# Patient Record
Sex: Male | Born: 1975 | Race: Black or African American | Hispanic: No | Smoking: Current every day smoker
Health system: Southern US, Community
[De-identification: ages and names within clinical notes are randomized; demographics above are authoritative.]

## PROBLEM LIST (undated history)

## (undated) DIAGNOSIS — F329 Major depressive disorder, single episode, unspecified: Secondary | ICD-10-CM

## (undated) DIAGNOSIS — N529 Male erectile dysfunction, unspecified: Secondary | ICD-10-CM

## (undated) DIAGNOSIS — E119 Type 2 diabetes mellitus without complications: Secondary | ICD-10-CM

## (undated) DIAGNOSIS — E669 Obesity, unspecified: Secondary | ICD-10-CM

## (undated) DIAGNOSIS — I10 Essential (primary) hypertension: Secondary | ICD-10-CM

## (undated) DIAGNOSIS — I5022 Chronic systolic (congestive) heart failure: Secondary | ICD-10-CM

## (undated) DIAGNOSIS — Z87898 Personal history of other specified conditions: Secondary | ICD-10-CM

## (undated) DIAGNOSIS — I472 Ventricular tachycardia: Secondary | ICD-10-CM

## (undated) DIAGNOSIS — Z4502 Encounter for adjustment and management of automatic implantable cardiac defibrillator: Secondary | ICD-10-CM

## (undated) DIAGNOSIS — I428 Other cardiomyopathies: Secondary | ICD-10-CM

## (undated) DIAGNOSIS — I4729 Other ventricular tachycardia: Secondary | ICD-10-CM

## (undated) HISTORY — DX: Major depressive disorder, single episode, unspecified: F32.9

## (undated) HISTORY — DX: Essential (primary) hypertension: I10

## (undated) HISTORY — PX: CARDIAC CATHETERIZATION: SHX172

## (undated) HISTORY — DX: Ventricular tachycardia: I47.2

## (undated) HISTORY — DX: Chronic systolic (congestive) heart failure: I50.22

## (undated) HISTORY — DX: Obesity, unspecified: E66.9

## (undated) HISTORY — DX: Type 2 diabetes mellitus without complications: E11.9

## (undated) HISTORY — DX: Encounter for adjustment and management of automatic implantable cardiac defibrillator: Z45.02

## (undated) HISTORY — DX: Personal history of other specified conditions: Z87.898

## (undated) HISTORY — DX: Male erectile dysfunction, unspecified: N52.9

## (undated) HISTORY — DX: Other ventricular tachycardia: I47.29

## (undated) HISTORY — DX: Other cardiomyopathies: I42.8

## (undated) SURGERY — Surgical Case
Anesthesia: *Unknown

---

## 1999-04-25 ENCOUNTER — Emergency Department (HOSPITAL_COMMUNITY): Admission: EM | Admit: 1999-04-25 | Discharge: 1999-04-25 | Payer: Self-pay | Admitting: Emergency Medicine

## 1999-11-18 ENCOUNTER — Encounter: Payer: Self-pay | Admitting: Emergency Medicine

## 1999-11-18 ENCOUNTER — Emergency Department (HOSPITAL_COMMUNITY): Admission: EM | Admit: 1999-11-18 | Discharge: 1999-11-18 | Payer: Self-pay | Admitting: Emergency Medicine

## 1999-11-19 ENCOUNTER — Encounter: Payer: Self-pay | Admitting: Emergency Medicine

## 1999-11-19 ENCOUNTER — Emergency Department (HOSPITAL_COMMUNITY): Admission: EM | Admit: 1999-11-19 | Discharge: 1999-11-19 | Payer: Self-pay | Admitting: Emergency Medicine

## 1999-11-27 ENCOUNTER — Ambulatory Visit: Admission: RE | Admit: 1999-11-27 | Discharge: 1999-11-27 | Payer: Self-pay | Admitting: Internal Medicine

## 2000-03-01 ENCOUNTER — Encounter: Payer: Self-pay | Admitting: Emergency Medicine

## 2000-03-01 ENCOUNTER — Emergency Department (HOSPITAL_COMMUNITY): Admission: EM | Admit: 2000-03-01 | Discharge: 2000-03-01 | Payer: Self-pay | Admitting: Emergency Medicine

## 2001-10-18 ENCOUNTER — Emergency Department (HOSPITAL_COMMUNITY): Admission: EM | Admit: 2001-10-18 | Discharge: 2001-10-18 | Payer: Self-pay | Admitting: *Deleted

## 2001-10-29 ENCOUNTER — Emergency Department (HOSPITAL_COMMUNITY): Admission: EM | Admit: 2001-10-29 | Discharge: 2001-10-29 | Payer: Self-pay | Admitting: *Deleted

## 2001-10-30 ENCOUNTER — Inpatient Hospital Stay (HOSPITAL_COMMUNITY): Admission: EM | Admit: 2001-10-30 | Discharge: 2001-11-02 | Payer: Self-pay | Admitting: Emergency Medicine

## 2001-10-30 ENCOUNTER — Encounter: Payer: Self-pay | Admitting: Internal Medicine

## 2001-11-01 ENCOUNTER — Encounter: Payer: Self-pay | Admitting: Internal Medicine

## 2001-11-06 ENCOUNTER — Encounter: Payer: Self-pay | Admitting: Internal Medicine

## 2001-11-06 ENCOUNTER — Ambulatory Visit (HOSPITAL_COMMUNITY): Admission: RE | Admit: 2001-11-06 | Discharge: 2001-11-06 | Payer: Self-pay | Admitting: Internal Medicine

## 2004-04-10 ENCOUNTER — Emergency Department (HOSPITAL_COMMUNITY): Admission: EM | Admit: 2004-04-10 | Discharge: 2004-04-10 | Payer: Self-pay | Admitting: Emergency Medicine

## 2005-01-23 ENCOUNTER — Emergency Department (HOSPITAL_COMMUNITY): Admission: EM | Admit: 2005-01-23 | Discharge: 2005-01-23 | Payer: Self-pay | Admitting: Emergency Medicine

## 2005-02-04 ENCOUNTER — Emergency Department (HOSPITAL_COMMUNITY): Admission: EM | Admit: 2005-02-04 | Discharge: 2005-02-04 | Payer: Self-pay | Admitting: Family Medicine

## 2005-02-16 ENCOUNTER — Inpatient Hospital Stay (HOSPITAL_COMMUNITY): Admission: EM | Admit: 2005-02-16 | Discharge: 2005-02-19 | Payer: Self-pay | Admitting: Emergency Medicine

## 2005-02-16 ENCOUNTER — Ambulatory Visit: Payer: Self-pay | Admitting: Cardiology

## 2005-02-17 ENCOUNTER — Encounter: Payer: Self-pay | Admitting: Cardiology

## 2005-02-23 ENCOUNTER — Inpatient Hospital Stay (HOSPITAL_BASED_OUTPATIENT_CLINIC_OR_DEPARTMENT_OTHER): Admission: RE | Admit: 2005-02-23 | Discharge: 2005-02-23 | Payer: Self-pay | Admitting: Cardiology

## 2005-02-23 ENCOUNTER — Ambulatory Visit: Payer: Self-pay | Admitting: Internal Medicine

## 2005-03-05 ENCOUNTER — Ambulatory Visit: Payer: Self-pay | Admitting: Internal Medicine

## 2005-03-08 ENCOUNTER — Ambulatory Visit: Payer: Self-pay | Admitting: *Deleted

## 2005-03-11 ENCOUNTER — Ambulatory Visit: Payer: Self-pay | Admitting: Cardiology

## 2005-03-18 ENCOUNTER — Ambulatory Visit: Payer: Self-pay | Admitting: Internal Medicine

## 2005-03-26 ENCOUNTER — Ambulatory Visit: Payer: Self-pay | Admitting: Internal Medicine

## 2005-03-29 ENCOUNTER — Ambulatory Visit (HOSPITAL_COMMUNITY): Admission: RE | Admit: 2005-03-29 | Discharge: 2005-03-29 | Payer: Self-pay | Admitting: Internal Medicine

## 2005-03-29 ENCOUNTER — Ambulatory Visit: Payer: Self-pay | Admitting: Internal Medicine

## 2005-04-08 ENCOUNTER — Ambulatory Visit: Payer: Self-pay | Admitting: Internal Medicine

## 2005-04-26 ENCOUNTER — Ambulatory Visit: Payer: Self-pay | Admitting: Internal Medicine

## 2005-05-11 ENCOUNTER — Ambulatory Visit: Payer: Self-pay | Admitting: Internal Medicine

## 2005-06-15 ENCOUNTER — Ambulatory Visit: Payer: Self-pay | Admitting: Internal Medicine

## 2005-09-14 ENCOUNTER — Ambulatory Visit: Payer: Self-pay | Admitting: Internal Medicine

## 2005-11-09 ENCOUNTER — Ambulatory Visit: Payer: Self-pay | Admitting: Internal Medicine

## 2005-11-22 ENCOUNTER — Ambulatory Visit: Payer: Self-pay | Admitting: Cardiology

## 2005-12-23 ENCOUNTER — Ambulatory Visit: Payer: Self-pay | Admitting: Cardiology

## 2006-01-13 ENCOUNTER — Ambulatory Visit: Payer: Self-pay | Admitting: Internal Medicine

## 2007-09-01 ENCOUNTER — Emergency Department (HOSPITAL_COMMUNITY): Admission: EM | Admit: 2007-09-01 | Discharge: 2007-09-01 | Payer: Self-pay | Admitting: Emergency Medicine

## 2007-12-16 ENCOUNTER — Emergency Department (HOSPITAL_COMMUNITY): Admission: EM | Admit: 2007-12-16 | Discharge: 2007-12-16 | Payer: Self-pay | Admitting: Emergency Medicine

## 2008-03-15 ENCOUNTER — Ambulatory Visit: Payer: Self-pay | Admitting: Internal Medicine

## 2008-07-14 ENCOUNTER — Emergency Department (HOSPITAL_COMMUNITY): Admission: EM | Admit: 2008-07-14 | Discharge: 2008-07-14 | Payer: Self-pay | Admitting: Emergency Medicine

## 2008-08-08 ENCOUNTER — Ambulatory Visit: Payer: Self-pay | Admitting: Cardiology

## 2008-08-08 LAB — CONVERTED CEMR LAB
BUN: 9 mg/dL (ref 6–23)
CO2: 27 meq/L (ref 19–32)
Chloride: 110 meq/L (ref 96–112)
Creatinine, Ser: 1.2 mg/dL (ref 0.4–1.5)
GFR calc non Af Amer: 74 mL/min
Potassium: 3.6 meq/L (ref 3.5–5.1)

## 2008-11-19 ENCOUNTER — Emergency Department (HOSPITAL_COMMUNITY): Admission: EM | Admit: 2008-11-19 | Discharge: 2008-11-19 | Payer: Self-pay | Admitting: Emergency Medicine

## 2009-04-10 ENCOUNTER — Emergency Department (HOSPITAL_COMMUNITY): Admission: EM | Admit: 2009-04-10 | Discharge: 2009-04-10 | Payer: Self-pay | Admitting: Emergency Medicine

## 2009-04-28 DIAGNOSIS — I509 Heart failure, unspecified: Secondary | ICD-10-CM | POA: Insufficient documentation

## 2009-04-28 DIAGNOSIS — E1122 Type 2 diabetes mellitus with diabetic chronic kidney disease: Secondary | ICD-10-CM

## 2009-04-28 DIAGNOSIS — N182 Chronic kidney disease, stage 2 (mild): Secondary | ICD-10-CM

## 2009-04-28 DIAGNOSIS — I10 Essential (primary) hypertension: Secondary | ICD-10-CM | POA: Insufficient documentation

## 2009-04-28 DIAGNOSIS — E785 Hyperlipidemia, unspecified: Secondary | ICD-10-CM

## 2009-04-29 ENCOUNTER — Encounter (INDEPENDENT_AMBULATORY_CARE_PROVIDER_SITE_OTHER): Payer: Self-pay | Admitting: *Deleted

## 2009-10-01 ENCOUNTER — Emergency Department (HOSPITAL_COMMUNITY): Admission: EM | Admit: 2009-10-01 | Discharge: 2009-10-01 | Payer: Self-pay | Admitting: Emergency Medicine

## 2009-12-26 ENCOUNTER — Emergency Department (HOSPITAL_COMMUNITY): Admission: EM | Admit: 2009-12-26 | Discharge: 2009-12-26 | Payer: Self-pay | Admitting: Emergency Medicine

## 2011-02-05 LAB — DIFFERENTIAL
Basophils Absolute: 0 10*3/uL (ref 0.0–0.1)
Basophils Relative: 1 % (ref 0–1)
Eosinophils Absolute: 0.1 10*3/uL (ref 0.0–0.7)
Eosinophils Relative: 1 % (ref 0–5)
Monocytes Absolute: 0.6 10*3/uL (ref 0.1–1.0)

## 2011-02-05 LAB — BRAIN NATRIURETIC PEPTIDE: Pro B Natriuretic peptide (BNP): 454 pg/mL — ABNORMAL HIGH (ref 0.0–100.0)

## 2011-02-05 LAB — POCT I-STAT, CHEM 8
Calcium, Ion: 1.12 mmol/L (ref 1.12–1.32)
Chloride: 105 mEq/L (ref 96–112)
Glucose, Bld: 105 mg/dL — ABNORMAL HIGH (ref 70–99)
HCT: 46 % (ref 39.0–52.0)
Hemoglobin: 15.6 g/dL (ref 13.0–17.0)
TCO2: 27 mmol/L (ref 0–100)

## 2011-02-05 LAB — POCT CARDIAC MARKERS
CKMB, poc: <1 ng/mL — ABNORMAL LOW (ref 1.0–8.0)
Myoglobin, poc: 60.4 ng/mL (ref 12–200)
Troponin i, poc: <0.05 ng/mL (ref 0.00–0.09)

## 2011-02-05 LAB — CBC: HCT: 45.5 % (ref 39.0–52.0)

## 2011-02-18 ENCOUNTER — Encounter: Payer: Self-pay | Admitting: Physician Assistant

## 2011-02-22 ENCOUNTER — Ambulatory Visit: Payer: Self-pay | Admitting: Physician Assistant

## 2011-02-23 LAB — CBC
HCT: 44.2 % (ref 39.0–52.0)
Hemoglobin: 15.1 g/dL (ref 13.0–17.0)
MCV: 88.2 fL (ref 78.0–100.0)
Platelets: 184 10*3/uL (ref 150–400)
RBC: 5.01 MIL/uL (ref 4.22–5.81)
WBC: 6.3 10*3/uL (ref 4.0–10.5)

## 2011-02-23 LAB — BASIC METABOLIC PANEL
BUN: 11 mg/dL (ref 6–23)
Calcium: 8.4 mg/dL (ref 8.4–10.5)
Creatinine, Ser: 1.02 mg/dL (ref 0.4–1.5)
GFR calc non Af Amer: >60 mL/min (ref >60)
Glucose, Bld: 115 mg/dL — ABNORMAL HIGH (ref 70–99)

## 2011-02-23 LAB — DIFFERENTIAL
Eosinophils Absolute: 0.2 10*3/uL (ref 0.0–0.7)
Eosinophils Relative: 3 % (ref 0–5)
Lymphs Abs: 2.8 10*3/uL (ref 0.7–4.0)

## 2011-02-23 LAB — BRAIN NATRIURETIC PEPTIDE: Pro B Natriuretic peptide (BNP): 349 pg/mL — ABNORMAL HIGH (ref 0.0–100.0)

## 2011-03-01 LAB — DIFFERENTIAL
Eosinophils Absolute: 0 10*3/uL (ref 0.0–0.7)
Eosinophils Relative: 0 % (ref 0–5)
Lymphocytes Relative: 5 % — ABNORMAL LOW (ref 12–46)
Lymphs Abs: 0.4 10*3/uL — ABNORMAL LOW (ref 0.7–4.0)
Monocytes Absolute: 0.6 10*3/uL (ref 0.1–1.0)
Monocytes Relative: 8 % (ref 3–12)

## 2011-03-01 LAB — CBC
Hemoglobin: 16.5 g/dL (ref 13.0–17.0)
MCHC: 33.7 g/dL (ref 30.0–36.0)
MCV: 89.1 fL (ref 78.0–100.0)
Platelets: 193 10*3/uL (ref 150–400)
RBC: 5.5 MIL/uL (ref 4.22–5.81)
WBC: 7.5 10*3/uL (ref 4.0–10.5)

## 2011-03-01 LAB — COMPREHENSIVE METABOLIC PANEL
ALT: 36 U/L (ref 0–53)
AST: 27 U/L (ref 0–37)
Albumin: 4 g/dL (ref 3.5–5.2)
Alkaline Phosphatase: 78 U/L (ref 39–117)
BUN: 12 mg/dL (ref 6–23)
CO2: 27 mEq/L (ref 19–32)
Chloride: 107 mEq/L (ref 96–112)
Creatinine, Ser: 1.21 mg/dL (ref 0.4–1.5)

## 2011-03-03 ENCOUNTER — Encounter: Payer: Self-pay | Admitting: Physician Assistant

## 2011-03-03 ENCOUNTER — Ambulatory Visit (INDEPENDENT_AMBULATORY_CARE_PROVIDER_SITE_OTHER): Payer: Self-pay | Admitting: Physician Assistant

## 2011-03-03 VITALS — BP 140/100 | HR 75 | Resp 18 | Ht 76.0 in | Wt 276.1 lb

## 2011-03-03 DIAGNOSIS — I509 Heart failure, unspecified: Secondary | ICD-10-CM

## 2011-03-03 DIAGNOSIS — I10 Essential (primary) hypertension: Secondary | ICD-10-CM

## 2011-03-03 DIAGNOSIS — I428 Other cardiomyopathies: Secondary | ICD-10-CM

## 2011-03-03 MED ORDER — CARVEDILOL 6.25 MG PO TABS: 6.2500 mg | ORAL_TABLET | Freq: Two times a day (BID) | ORAL | Status: DC

## 2011-03-03 MED ORDER — FUROSEMIDE 40 MG PO TABS: 40.0000 mg | ORAL_TABLET | Freq: Every day | ORAL | Status: DC

## 2011-03-03 MED ORDER — POTASSIUM CHLORIDE CRYS ER 20 MEQ PO TBCR: 20.0000 meq | EXTENDED_RELEASE_TABLET | Freq: Every day | ORAL | Status: DC

## 2011-03-03 MED ORDER — LISINOPRIL 10 MG PO TABS: 10.0000 mg | ORAL_TABLET | Freq: Every day | ORAL | Status: DC

## 2011-03-03 NOTE — Assessment & Plan Note (Signed)
Uncontrolled.  Reinitiate medications as outlined.  Follow up in 2 weeks.

## 2011-03-03 NOTE — Patient Instructions (Addendum)
Your physician recommends that you schedule a follow-up appointment in: 03/17/11 @ 10:30 TO SEE SCOTT WEAVER, PA-C.  Your physician recommends that you schedule a follow-up appointment in: 2 MONTHS WITH DR. [name] AS PER SCOTT WEAVER, PA-C.  Your physician recommends that you return for lab work in: TODAY BMET 425.4, 401.9....YOU ALSO NEED TO COME IN ON 4/27 FOR REPEAT BMET 425.4, 401.9.  Your physician has recommended you make the following change in your medication:RESTART LASIX 40 MG 1 TAB DAILY, DAY 2 START COREG 6.25 MG 1 TAB TWICE DAILY , DAY 3 START POTASSIUM 20 mEq 1 TAB DAILY, AND ON DAY 4 START LISINOPRIL 10 MG 1 TAB DAILY.

## 2011-03-03 NOTE — Progress Notes (Signed)
History of Present Illness: Primary Cardiologist:  Dr. [name]  Douglas [name] is a 35 y.o. male with a h/o NICM returns for follow up after a long hiatus.  He was last seen in 2009.    He has been out of all of his medications for several months now.  He was taking furosemide up until the last few weeks and then ran out.  He denies chest pain.  He denies significant shortness of breath.  He denies orthopnea, PND or pedal edema.  He has had some swelling in his extremities.  He denies syncope.  He denies palpitations.  He had a recent URI.  His cough is improving.  Past Medical History  Diagnosis Date  . Diabetes mellitus   . Hypertension   . Hyperlipidemia   . Systolic CHF, chronic   . Obesity   . Nonischemic cardiomyopathy     a.  echo 4/06: EF 30%, mild to mod MR, mild RAE, inf HK, lat HK , ant HK;    b.  cath 4/06: no CAD, EF 20-25%  . ED (erectile dysfunction)   . NSVT (nonsustained ventricular tachycardia)     eval by EP in past; no ICD candidate due to NYHA 1 symptoms    Current Outpatient Prescriptions  Medication Sig Dispense Refill  . aspirin 81 MG tablet Take 81 mg by mouth daily.        . potassium chloride (KLOR-CON) 10 MEQ CR tablet Take 10 mEq by mouth 2 (two) times daily.        Marland Kitchen DISCONTD: bisoprolol-hydrochlorothiazide (ZIAC) 10-6.25 MG per tablet Take 1 tablet by mouth daily.        Marland Kitchen DISCONTD: furosemide (LASIX) 40 MG tablet Take 40 mg by mouth daily.        Marland Kitchen DISCONTD: lisinopril (PRINIVIL,ZESTRIL) 20 MG tablet Take 20 mg by mouth daily.          Allergies not on file  History  Substance Use Topics  . Smoking status: Current Some Day Smoker -- 0.3 packs/day    Types: Cigarettes  . Smokeless tobacco: Never Used  . Alcohol Use: 0.5 oz/week    1 drink(s) per week    ROS:  Please see the history of present illness.  He denies fevers, chills, melena, hematochezia.  All other systems reviewed and negative.  Vital Signs: BP 140/100  Pulse 75   Resp 18  Ht 6\' 4"  (1.93 m)  Wt 276 lb 1.9 oz (125.247 kg)  BMI 33.61 kg/m2  PHYSICAL EXAM: Well nourished, well developed, in no acute distress HEENT: normal Neck: no JVD Endocrine: No thyromegaly Cardiac:  normal S1, S2; RRR; no murmur, no gallops Lungs:  clear to auscultation bilaterally, no wheezing, rhonchi or rales Abd: soft, nontender, no hepatomegaly Ext: 1+ bilateral edema Skin: warm and dry Neuro:  CNs 2-12 intact, no focal abnormalities noted Psych: Normal affect  EKG:  Sinus rhythm, heart rate 75, LVH, Nonspecific ST-T wave changes  ASSESSMENT AND PLAN:

## 2011-03-03 NOTE — Assessment & Plan Note (Signed)
Overall, he is stable.  He does have some volume overload on exam.  I will reinitiate all of his CHF medications.  He will first start with furosemide 40 mg a day plus potassium 20 mEq a day.  He will then add Coreg 6.25 mg b.i.d.  He will then add lisinopril 10 mg a day.  Check a basic metabolic panel today.  Follow up with a basic metabolic panel in 10 days.  Follow up with me in 2 weeks and Dr. [name] in 2 months.

## 2011-03-04 LAB — BASIC METABOLIC PANEL
CO2: 28 mEq/L (ref 19–32)
Calcium: 9.4 mg/dL (ref 8.4–10.5)
GFR: 80.15 mL/min (ref >60.00)
Potassium: 5.2 mEq/L — ABNORMAL HIGH (ref 3.5–5.1)
Sodium: 141 mEq/L (ref 135–145)

## 2011-03-05 ENCOUNTER — Telehealth: Payer: Self-pay | Admitting: *Deleted

## 2011-03-05 DIAGNOSIS — I428 Other cardiomyopathies: Secondary | ICD-10-CM

## 2011-03-05 DIAGNOSIS — I10 Essential (primary) hypertension: Secondary | ICD-10-CM

## 2011-03-05 NOTE — Telephone Encounter (Signed)
See phone note

## 2011-03-12 ENCOUNTER — Other Ambulatory Visit: Payer: Self-pay | Admitting: *Deleted

## 2011-03-17 ENCOUNTER — Ambulatory Visit: Payer: Self-pay | Admitting: Physician Assistant

## 2011-03-30 NOTE — Assessment & Plan Note (Signed)
[facility]                            CARDIOLOGY OFFICE NOTE   THONG, FEENY                    MRN:          [id number]  DATE:03/15/2008                            DOB:          09-20-1976    PRIMARY CARE PHYSICIAN:  [facility].   INTERVAL HISTORY:  Douglas [name] is a delightful 35 year old male with a  history of nonischemic heart failure secondary to nonischemic  cardiomyopathy with previous ejection fraction of 20-25%.  He returns to  clinic after nearly 2-year absence.   Remainder of medical history is notable for diabetes, morbid obesity,  hypertension, hyperlipidemia, and nonsustained VT.   He has been evaluated by Dr. Lewayne Bunting in the past for possible ICD,  but thought not to be a candidate as he was NYHA class I.   He returns today for follow-up.  He is doing great.  He says he is quite  active to do almost anything he wants without any functional limitation.  Denies any chest pain or shortness of breath.  No lower extremity edema.  He has been compliant with his medications.  He has lost almost 40  pounds through diet and exercise.   MEDICATIONS:  1. Aspirin 81 a day.  2. Lisinopril 20 a day.  3. Bisoprolol/HCTZ 10/6.25 a day.  4. Lasix 40 a day.   PHYSICAL EXAMINATION:  GENERAL:  He is well-appearing, no acute  distress.  Ambulates around the clinic without any respiratory  difficulty.  VITAL SIGNS:  Blood pressure is 118/88, heart rate 56,  weight 288.  HEENT:  Normal.  NECK:  Supple.  No JVD.  Carotids are 2+ bilaterally without any bruits.  There is no lymphadenopathy or thyromegaly.  CARDIAC:  PMI is nonpalpable.  Regular rate and rhythm.  No murmurs,  rubs or gallops.  LUNGS:  Clear.  ABDOMEN:  Obese, nontender, nondistended, no hepatosplenomegaly, no  bruits, no masses.  Good bowel sounds.  EXTREMITIES:  Warm with no  cyanosis, clubbing or edema.  No rash.  NEUROLOGICAL:  Alert and oriented x3.  Cranial nerves  II-XII intact.  Moves all four extremities without difficulty.  Affect is pleasant.   EKG shows sinus bradycardia with no ST-T wave abnormalities.  I did a  quick look echocardiogram which EF appears to be about 35-40% so it has  improved.  He will get a formal echocardiogram here in the near future.   ASSESSMENT/PLAN:  1. Congestive heart failure secondary to nonischemic cardiomyopathy.      Ejection fraction seems to be improved.  He is on a good medical      regimen.  He will get a formal echocardiogram soon.  We increased      his lisinopril to 40 mg a day.  He will need BMET in 1-2 weeks.      Currently not a defibrillator candidate due to improved ejection      fraction and functional class I.  2. Hypertension well-controlled.  3. Hyperlipidemia.  This is followed by [facility].  4. Erectile dysfunction.  Will give him prescription for Viagra.  I  have instructed him never to use this with any nitrates.     Bevelyn Buckles. Bensimhon, MD  Electronically Signed    DRB/MedQ  DD: 03/15/2008  DT: 03/15/2008  Job #: [id number]   cc:   Tomasa Rand

## 2011-03-30 NOTE — Assessment & Plan Note (Signed)
[facility]                          CHRONIC HEART FAILURE NOTE   NAME:Douglas [name], Douglas [name]                    MRN:          [id number]  DATE:08/08/2008                            DOB:          1976/07/30    PRIMARY CARE PHYSICIAN:  [facility].   PRIMARY CARDIOLOGIST:  Bevelyn Buckles. Bensimhon, MD   Mr. Mole has not been here in the Heart Failure Clinic since 2007.  Does not look like he is has seen Dr. [name] since May 2009.  He  returns today because of a recent ER visit for acute exacerbation of his  congestive heart failure, which is secondary to nonischemic  cardiomyopathy in the setting of his of morbid obesity and hypertension.  Dequandre states he ran out of his medicines while he was in the process  of moving to [place], could not locate the boxes, had his medications  in them, and went without for several days while consuming large amounts  of fluid.  Needless to say he woke up one night and extremely short of  breath presented to the emergency room at [facility] where he was  diuresed and stabilized and apparently was discharged home from there  because there is no documentation that we saw him.  He states he has  been compliant with this medication since being discharged home.  Except  for his bisoprolol, he is going to pick that up today.  Needless, his  blood pressure is elevated, but states he is going to get the bisoprolol  soon as he leaves.  He continues to try and lose weight.  He states he  walks.  He is not employed because of his disability.  His last  documented recording of ejection fraction was 20-25% and this was by  echocardiogram in 2006, however at the patient's visit with Dr.  [name] back in May of this year, it sounds like he did a quick and  formal bedside echo that showed his EF of 35-40% and had arranged for  the patient to have a followup echocardiogram done which the patient  never returned for.  Hobart  denies any shortness of breath, orthopnea,  PND, states his weight has been consistent at home.   PAST MEDICAL HISTORY:  1. Congestive heart failure secondary to nonischemic cardiomyopathy      with EF possibly 35-40%.  an echo has not been repeated since 2006.  2. Morbid obesity.  3. Hypertension.  4. Medical noncompliance.  5. History of nonsustained V-tach.  6. Diabetes.  7. Hyperlipidemia.   PHYSICAL EXAMINATION:  VITAL SIGNS:  Weight 284 pounds, blood pressure  150/90 with a heart rate of 84.  GENERAL:  [name] is in no acute distress at this time.  NECK:  No signs of jugular vein distention at 45-degree angle.  LUNGS:  Clear to auscultation bilaterally.  CARDIOVASCULAR:  S1 and S2.  Regular rate and rhythm.  ABDOMEN:  Soft and nontender.  Positive bowel sounds.  LOWER EXTREMITIES:  Without clubbing, cyanosis, or edema.  NEUROLOGIC:  Alert and oriented x3.   A 12-lead EKG showing sinus rhythm.  PR  170, QRS of 94, QT of 408, and a  QTc of 479 msec.  Previous echo or EKG done in May shows a QT, QTc of  440, and 424 msec respectively.   IMPRESSION:  Congestive heart failure secondary to nonischemic  cardiomyopathy.  The patient without signs of volume overload at this  time.  For the history of obesity, hypertension, and noncompliance, have  not taken his bisoprolol yet today, states he is picking it up on the  way home, slightly moderately hypertensive.  I have reinforced the  importance of medication compliance, weight loss with Mr. Bewley.  We  need to repeat his 2-D echocardiogram.  He states he cannot afford to do  that right now.  I reminded him that he is still at risk for repeat  exacerbations if he stops those medications.  We will continue  medications at current dose, and I will see him back in 4 weeks for  followup and schedule him to see Dr. [name] also.      Dorian Pod, ACNP  Electronically Signed      Rollene Rotunda, MD, [facility]  Electronically  Signed   MB/MedQ  DD: 08/08/2008  DT: 08/09/2008  Job #: [id number]

## 2011-04-02 NOTE — Cardiovascular Report (Signed)
NAMEALFIE, RIDEAUX NO.:  [id number]   MEDICAL RECORD NO.:  [id number]          PATIENT TYPE:  OIB   LOCATION:  6501                         FACILITY:  [facility]   PHYSICIAN:  [name], M.D. LHCDATE OF BIRTH:  1976/09/25   DATE OF PROCEDURE:  02/23/2005  DATE OF DISCHARGE:                              CARDIAC CATHETERIZATION   PATIENT IDENTIFICATION:  Mr. Bostock is a 35 year old man with a history of  hypertension, hyperlipidemia and diabetes with a several-year history of  presumed nonischemic cardiomyopathy.  He was recently admitted for a heart  failure flare.  He did have some nonsustained ventricular tachycardia at the  time.  He was referred for a diagnostic right and left heart  catheterization.   PROCEDURES PERFORMED:  1.  Selective coronary angiography.  2.  Left heart catheterization.  3.  Left ventriculogram.  4.  Right heart catheterization.   DESCRIPTION OF PROCEDURE:  The risks and benefits of the catheterization  were explained to Mr. Jimmey Ralph.  Consent was signed and placed on the chart.  The right groin area was prepped and draped in routine sterile fashion.  It  was then anesthetized with 1% local lidocaine.  Using a Smart needle, the  right femoral artery was identified and easily cannulated.  A 4 French  arterial sheath was placed using a modified Seldinger technique.  Again  using the Smart needle, the right femoral vein was identified and cannulated  and the 7 French venous sheath was placed using the same technique.  Standard preformed catheters, including a Judkins JL4, JR4 and an angled  pigtail were used for the procedure.  All catheter exchanges were made over  a wire.  There were no apparent complications.  At the end of the procedure,  the patient was transferred to the holding area in stable condition for  removal of his vascular access.   FINDINGS:  Right atrial pressure mean 10.  RV pressure 42/8 with a mean of  11.  PA  42/18 with a mean of 30.  Pulmonary wedge pressure mean of 24.  PA  saturation was 69.  SA saturation was 96.  Fick cardiac output was 6.7  L/min.  Fick cardiac index was 2.5 L/min per sq min.  Central aortic  pressure was 89/66 with a mean of 75.  LV was 89/6 with an EDP of 21.   CORONARIES:  The left main was short with no angiographic CAD.  The LAD was  a long vessel that wrapped the apex and gave off two large diagonals.  There  was no significant angiographic CAD.   The left circumflex was a large vessel with significant ectasia through the  mid portion.  It gave off four marginal branches.  The first two were tiny.  The OM3 was a large branching vessel.  The OM4 was normal size.  There were  several small PLs off of the distal AV groove circumflex.  There was no  angiographic CAD.   The right coronary was a large dominant vessel.  It gave off a large  branching PDA and small PL.  There  was diffuse ectasia through the proximal  to mid portion of the RCA with very sluggish flow with swirling of contrast.  However, there was no significant coronary disease.   Left ventriculogram done in the RAO approach showed a dilated heart with  global hypokinesis and an EF of 20-25%.   ASSESSMENT AND PLAN:  1.  Nonischemic cardiomyopathy with no evidence of significant epicardial      coronary disease.  There are ectatic vessels in both the left circumflex      and RCA with very sluggish flow in the RCA.  2.  Dilated left ventricle with markedly decreased ejection fraction.  3.  Mildly elevated left-sided filling pressures with well compensated      cardiac output.  4.  History of nonsustained ventricular tachycardia.   DISCUSSION/PLAN:  Although his cardiac output is well compensated given his  low systolic pressure and evidence of nonsustained tachycardia, he is at  high risk for morbidity and mortality from heart failure and sudden cardiac  death.  He will need continued medical  management.  I discussed with him  that I would favor proceeding with ICD plus/minus biventricular pacer if he  has significant dyssynchrony on echocardiography.  Would likely benefit from  followup in the heart failure clinic for medication titration with Shelby Dubin.      DB/MEDQ  D:  02/23/2005  T:  02/23/2005  Job:  [id number]

## 2011-04-02 NOTE — Consult Note (Signed)
NAME:  GERLAD, PELZEL NO.:  [id number]   MEDICAL RECORD NO.:  [id number]          PATIENT TYPE:  INP   LOCATION:  3705                         FACILITY:  [facility]   PHYSICIAN:  Pricilla Riffle, M.D.    DATE OF BIRTH:  August 19, 1976   DATE OF CONSULTATION:  DATE OF DISCHARGE:  02/19/2005                                   CONSULTATION   ADDENDUM:  Mr. Berkovich is a 35 year old gentleman who we were asked to see  regarding congestive heart failure.  Note of previous full dictation was  done by Ok Anis, N.P. (dictation [phone]).  This is an addendum  to this.  I have seen the patient, agree with the findings.  He is a 28-year-  old with a history of cardiomyopathy, assumed nonischemic by a Cardiolite in  2002.  Also has a history of hypertension, diabetes, and dyslipidemia.  He  was admitted on February 16, 2005, with increased shortness of breath felt  secondary to running out of his medicines.  Note, BNP on admit thought was  only 233.  He had a minimal increase in his troponin on admission.  Currently, the patient is doing better with diuresis; otherwise, workup is  significant for an LDL of 197.  Echocardiogram showed a dilated LV with an  LVEF of 30% with some segmental wall motions changes with better movement  near the apex.  Chest CT today showed no evidence of a PE, no significant  calcification noted on the coronaries.  Telemetry was significant for runs  of nonsustained VT (monomorphic).   Based on the above findings, agree with current medical regimen.  The  patient would be a candidate for ICD placement.  Wound need to, however,  rule out inducible ischemia, given that it has been a few years since  previous scan, and this is documented now.  Have reviewed with the patient,  given his risk factors, would recommend left heart catheterization to define  his anatomy once and for all.  Again, other option would be noninvasive test  again.  The patient  understand and agrees for the catheterization.  Will  plan for next week tentatively on Tuesday.  If this is negative, refer to EP  for ICD consideration.   For now, would continue on current regimen but add Vytorin 10/40 for his  lipid control, can work with the patient regarding getting medicines after  discharge.      PVR/MEDQ  D:  02/19/2005  T:  02/20/2005  Job:  [id number]

## 2011-04-02 NOTE — Discharge Summary (Signed)
NAMECAYLE, THUNDER NO.:  [id number]   MEDICAL RECORD NO.:  [id number]          PATIENT TYPE:  INP   LOCATION:  3705                         FACILITY:  [facility]   PHYSICIAN:  [name], M.D.    DATE OF BIRTH:  1976-07-31   DATE OF ADMISSION:  02/16/2005  DATE OF DISCHARGE:  02/19/2005                                 DISCHARGE SUMMARY   DISCHARGE DIAGNOSES:  1.  Congestive heart failure.  2.  Ischemic cardiomyopathy.  3.  Pneumonia.  4.  Paroxysmal ventricular tachycardia.  5.  Systemic hypertension.  6.  Exogenous obesity.  7.  Type 2 diabetes mellitus.  8.  Mixed hyperlipidemia.   REASON FOR ADMISSION:  This is one of several [facility] admissions  for Mr. Vanbenschoten, a 35 year old gentleman who presents to the emergency room  with a complaint of shortness of breath and cough for at least 2 weeks'  duration.   PHYSICAL FINDINGS:  VITAL SIGNS:  Blood pressure 155/95 with a pulse rate of  120, respiratory rate 32, and temperature 101.  HEENT:  Head is normocephalic and atraumatic.  Pupils equal, round, and  reactive to light and accommodation.  Sclerae anicteric and no conjunctival  pallor.  NECK:  Supple with no adenopathy.  No JVD is noted at 30 degrees.  CHEST:  Bilateral right greater than left wheezing and scattered rhonchi  throughout both lung fields.  There is no evidence of any consolidation.  HEART:  His precordium is 2+ hyperdynamic.  S1 and S2 are normal.  No  significant murmurs or rubs.  ABDOMEN:  Obese, soft, nontender, no organomegaly and no masses.  GENITOURINARY:  Normal male.  RECTAL:  Deferred.  EXTREMITIES:  No clubbing or cyanosis.  Trace to 1+ pedal edema.   EKG; normal sinus rhythm with no deviation of the electrical axis with  increase in QT interval, but otherwise is no acute changes.   Chest x-ray revealed interstitial as well as alveolar edema.   HOSPITAL COURSE:  Mr. Vandyne is admitted with a working diagnosis  of  congestive heart failure related to a previous diagnosis of primary  cardiomyopathy.  The patient has been quite noncompliant which probably  accounts for his elevated blood pressure and congestive heart failure.  Of  note however, is that he had an outpatient history of a fever elevation to  101 and is noted on his admission to the ER to have a low grade fever.  Because of his elevated temperature, blood cultures, sputum cultures, and  urine cultures were also obtained.  The patient was started on Lovenox and  treated with parenteral diuretics for his interstitial pulmonary edema.  Serial enzymes and EKG's were negative for any acute pathology.  He did have  one episode of ventricular tachycardia in the hospital, but this was  asymptomatic.  The patient was seen in consultation by [facility].  It was their feeling that there was no indication for cardiac  catheterization or Cardiolite study.  His workup had been quite extensive  back in 2002 where a Cardiolite ejection fraction was 19% showing severe  global hypokinesis.  They too felt that the patient's course to this point  was related to his poor compliance.  A two-dimensional echocardiogram  performed at this admission did reveal mild to moderate mitral regurgitation  with a dilated left atrium and again severe septal hypokinesis.  Dr. Tenny Craw  felt that the patient should have Vytorin 1040 added to his medical regimen.  A CT scan performed did not show any evidence to suggest a mass lesion or  pulmonary emboli.  The primary consideration at the time of discharge was  whether or not the patient needed an ICD.  It was our feeling that this  could be done as an outpatient.  The patient will be scheduled to be seen by  Duke Salvia, M.D. or Dr. Ladona Ridgel on his follow-up visit with Pricilla Riffle, M.D.   CONDITION ON DISCHARGE:  Significantly improved.   PROGNOSIS:  Guarded in view of his poor compliance and multiple risk  factors  and in view of his significantly reduced ejection fraction.   The patient is scheduled to be seen back in my office in 3 weeks.   DISCHARGE MEDICATIONS:  1.  Avapro 300 mg per day.  2.  Coreg 12.5 b.i.d.  3.  Furosemide 80 mg once a day.  4.  Kay Ciel 20 mEq per day along with Vytorin 1040 once a day.   DIET:  He is discharged on a 3 gram sodium, carb-modified, 1800 calorie,  modified fat restricted diet.       PC/MEDQ  D:  04/22/2005  T:  04/22/2005  Job:  [id number]

## 2011-04-02 NOTE — Consult Note (Signed)
[name], Douglas [name] NO.:  [id number]   MEDICAL RECORD NO.:  [id number]          PATIENT TYPE:  INP   LOCATION:  3705                         FACILITY:  [facility]   PHYSICIAN:  Pricilla Riffle, M.D.    DATE OF BIRTH:  1976/07/20   DATE OF CONSULTATION:  DATE OF DISCHARGE:                                   CONSULTATION   PRIMARY CARE PHYSICIAN:  Dr. [name].   PATIENT PROFILE:  A 35-year-old married African-American male with prior  history of non ischemic cardiopathy who presented to the ER on February 16, 2005, with dyspnea on exertion and CHF.   PROBLEM LIST:  1.  Congestive heart failure/non ischemic cardiopathy.      1.  November 06, 2001, Cardiolite ejection fraction 19%, severe global          hypokinesis with decreased tracer activity anteriorly and inferiorly          without evidence of reversible ischemia.      2.  February 17, 2005, echocardiogram ejection fraction 30%, mild to          moderate mitral regurgitation, LA mildly dilated, trivial tricuspid          regurgitation, inferior and septal severe hypokinesis.  2.  Hypertension.  3.  Type 2 diabetes mellitus.  4.  Obesity.  5.  Hyperlipidemia with a total cholesterol documented in December 2002 of      257.  6.  Nonsustained ventricular tachycardia.   HISTORY OF PRESENT ILLNESS:  A 35 year old [place] American male with  history of nonischemic cardiomyopathy, CHF, hypertension, hyperlipidemia,  type 2 diabetes mellitus x3 years.  He was first diagnosed with nonischemic  cardiomyopathy in December of 2002 and he presented with CHF and mildly  elevated cardiac enzymes.  Functional testing at that time was negative for  ischemia and he was medical managed with ARB, beta-blocker and Lasix.  He  had done well until approximately one and a half months ago when he  developed nocturnal cough causing him to sleep in a recliner and was seen in  the ED on January 23, 2005, secondary to cough with chest x-ray  showing  questionable pulmonary edema.  Per patient, he was discharged home and was  given refills on his home medications which he had run out of previously.  His cough continued, however, he noted noncontributory limitations in his  activities and even on February 13, 2005, he was able to mow his lawn without  having to rest.  On February 12, 2005, he again ran out of his medicines and is  not able to afford them at this time as he was previously receiving Medicaid  which he now makes too much money to receive and his prescription coverage  from his job does not kick until next month.  On February 16, 2005, he spent  most of the prior night coughing and did not sleep well.  Was not able to  lie flat.  That morning he felt short of breath while getting himself ready  for work and then when he finally arrived  at work.  He had dyspnea on  exertion while walking downhill into the building from the parking lot.  Once in the building he sat down and rested and still felt short of breath  and at that point determined that he needed to go to the ER.  He called his  wife and was taken to [facility] ER.  In the ED, his  chest x-ray showed pulmonary edema and ECG was without new changes.  His  initial cardiac markers showed a troponin I of 0.06 with an MB of 2.4.  Subsequent troponins did elevate to a max of 0.18 on the second set and then  came back down to 0.15 by the third set.  His MB remained negative while his  CK peaked to 454.  He was diuresed with IV Lasix in the ED and then admitted  to the medicine service under Dr. [name].  On the evening of February 16, 2005, he had a 14-beat run of asymptomatic nonsustained VT while sleeping.  He had another episode of seven beats which was also asymptomatic on February 17, 2005.  He was reinitiated on Coreg, Lasix and Avapro and was started for  the first time on spironolactone.  With reintroduction of his medications,  blood pressure was  under good control and he drops weight from an admission  weight of 143.6 kg down to 141 kg today.  He underwent echocardiogram  on  February 17, 2005, which showed an EF of 30% with mild to moderate MR and mildly  dilated LA.  He had severe hypokinesis inferiorly and septally.  He now  reports feeling well without any additional shortness of breath and we have  been consulted to clear him prior to discharge.   ALLERGIES:  PENICILLIN.   MEDICATIONS DURING THIS HOSPITALIZATION:  1.  Sliding scale insulin.  2.  Protonix 40 mg daily.  3.  Avapro 30 mg daily.  4.  Aspirin 81 mg daily.  5.  Avelox 400 mg daily.  6.  Guaifenesin 400 mg t.i.d.  7.  K-Dur 20 mEq b.i.d.  8.  Lasix 40 mg daily.  9.  Coreg 12.5 mg b.i.d.  10. Spironolactone 25 mg daily.  11. Half normal saline at 20 mL an hour.  12. Lovenox 70 mg q.24h. for DVT prophylaxis.   FAMILY HISTORY:  Mother is age 52 with a history of CAD and hypertension.  Father is age 68 and alive and well.  He has two brothers and one sister and  there is hypertension there.   SOCIAL HISTORY:  He lives in [place], [place], with his wife and  works as a Investment banker, operational.  He has three children and denies any alcohol or tobacco  use.  He did try drugs when he said he was younger.  He does not exercise  regularly and states he does watch salt and diet as well as sugar.   REVIEW OF SYMPTOMS:  Positive for dyspnea on exertion, orthopnea, PND,  edema, cough as well as diabetes and otherwise all systems reviewed and  negative.   PHYSICAL EXAMINATION:  GENERAL APPEARANCE:  A pleasant hyperactive male in  no acute distress.  Awake, alert and oriented x3.  VITAL SIGNS:  Temperature 97.7, heart rate 77, respirations 24, blood  pressure 114/66, weight 141 kg down from an admission weight of 143.6 kg,  pulse oximeter 95% on room air. I&O's do not appear to be adequately  recorded.  HEENT:  Normocephalic and atraumatic. NECK:  Normal carotid upstrokes.  No  bruits or JVD.  LUNGS:  Respirations regular and unlabored.  CARDIOVASCULAR:  Regular S1 and S2, no S3, S4 or murmurs.  ABDOMEN:  Obese, soft, nontender and nondistended.  Bowel sounds present x4.  EXTREMITIES:  Warm and dry.  No clubbing, cyanosis, or edema.  Dorsalis  pedis and posterior tibial pulses 2+ and equal bilaterally.  SKIN:  Warm and dry without lesions, no masses.  MUSCULOSKELETAL:  No deformity or effusions.   ACCESSORY CLINICAL FINDINGS:  Hemoglobin 14.0, hematocrit 41, wbc 7.8,  platelets 284.  Sodium 139, potassium 4.1, chloride 105, CO2 25, BUN 11,  creatinine 0.9, glucose 96.  Total bilirubin 2.2, alkaline phosphatase 66,  AST 21, ALT 32, total protein 6.5, albumin 3.1.  BNP 133.2 down from an  admission BNP of 223.  PT 13.3, INR 1.0.  TSH 0.833, free T4 1.07.  UA was  negative.  CPK 454, MB 2.4 and troponin I of 0.18.   Chest x-ray on February 18, 2005, showed mild persistent pulmonary edema.   EKG February 17, 2005, showed sinus rhythm with normal axis without STT changes  and left atrial enlargement.   ASSESSMENT/PLAN:  1.  Nonischemic cardiomyopathy/congestive heart failure.  Patient came off      of all medications on February 12, 2005, secondary to financial difficulty      being in between [place] and his insurance coverage which kicks in next      month.  He has been reinitiated on ARB, beta-blocker, Lasix,      spironolactone and has been doing well in house and is now euvolemic      with good blood pressure control.  May be worth while considering      switching him to more affordable medications which has Lopressor b.i.d.      and Lisinopril if he has no previous ACE intolerance.  Would continue      spironolactone and Lasix.  He needs to undergo congestive heart failure      teaching with the importance of  medication compliance as well as sodium      restriction and daily weights driven home.  Patient also has close      outpatient follow-up in heart failure  clinic.  2.  Nonsustained ventricular tachycardia.  He had asymptomatic 14 beat run      as well as 7 beat run of nonsustained ventricular tachycardia during his      hospitalization.  With an ejection fraction of 30%, he certainly      qualifies for implantable cardioverter defibrillator placement per SCD-      HEFT data.  He has not had an ischemic evaluation since December of 2002      and likely prior to any consideration of implantable cardioverter      defibrillator placement, we should reconsider ischemic evaluation with      either another functional study or catheterization.  This likely could      be done as an outpatient.  Continue beta-blocker therapy.  3.  Elevated troponin.  It is hard to know what to make of this.  He has a      history of dilated cardiomyopathy without any ischemic disease in the      past or without any ischemia functional study in the past.  On his last     admission December 2002, he was noted to have a very mildly elevated  troponin as 0.04.  As stated above, he likely should have another      ischemic evaluation.  4.  Hypertension.  This is actually  under good control with beta-blocker      and ARB and Lasix.  5.  Hyperlipidemia.  It looks like he is written for a lipid profile in the      a.m.  Given his history of diabetes as well as documented total      cholesterol of 257 in December 2002, he should certainly be started on      statin therapy.      CRB/MEDQ  D:  02/19/2005  T:  02/19/2005  Job:  [id number]

## 2011-04-02 NOTE — Discharge Summary (Signed)
. [facility]  Patient:    Douglas [name], Douglas [name] Visit Number: [id number] MRN: [id number]          Service Type: MED Location: 3700 [id number] Attending Physician:  Virgia Land Dictated by:   Lindell Spar. Chestine Spore, M.D. Admit Date:  10/29/2001 Discharge Date: 11/02/2001                             Discharge Summary  DISCHARGE DIAGNOSES: 1. Systemic hypertension with hypertensive heart disease, congestive heart    failure, and dilated cardiomyopathy. 2. History of bronchial asthma and right lower lobe pneumonia, with the    organism being unspecified.  REASON FOR ADMISSION:  Douglas [name] is a 35 year old hypertensive gentleman, last seen in the office about two to three years prior to admission.  He presents to the emergency room with a chief complaint of shortness of breath and a persistent cough.  The patient states that he had not been taking any of his antihypertensive medications, and has not been under the care of any other physicians over that two to three year period.  The patient was evaluated in the emergency room where a chest x-ray revealed a right lower lobe infiltrate consistent with pneumonia.  He was hypoxemic, and his blood pressure was elevated at 180/102.  LABORATORY DATA:  A 2-D echocardiogram revealed a left ventricle that was mildly to moderately dilated, overall left ventricular function was diminished with an ejection fraction estimated at 30 to 40%.  He had moderate and diffuse left ventricular hypokinesia, mild to moderate mitral valve regurgitation, the volume of mitral regurgitation by proximal isovelocities surface area was 15 cc.  His EKG revealed sinus tachycardia, no deviation of the electrical axis, and no acute ST-T wave changes.  His admission chest x-ray revealed patchy infiltrates involving the right upper and lower lobes.  There was a question of curly B-lines on the left as well.  It was felt that these  changes were consistent with pneumonia, as well as a symmetric pulmonary edema.  An MRA of the abdomen revealed single renal arteries bilaterally without evidence of renal artery stenosis, and his solid organs were unremarkable.  Arterial blood gas on room air showed his PO2 of 60, PCO2 34, pH 7.45.  White count was elevated at 13,000 with a shift to the left.  Hematocrit 42, hemoglobin 14.4. His metabolic profile was normal with the exception of a non-fasting glucose of 120.  His ALT was elevated slightly at 44, total bilirubin of 1.7.  He had elevations of his CK of 1148, CPK-MB slightly high at 5.4, and troponin-I of 0.4.  His cholesterol was elevated at 257.  T4 of 9.6, TSH of 0.664. Urinalysis showed specific gravity of 1017, pH was 5.5, negative glucose, bilirubin, and hemoglobin.  Positive ketones, and protein is 30, negative nitrites and leukocyte esterase.  Blood cultures x2 revealed no growth. Sputum cultures failed to reveal a single pathogen.  He had normal oropharyngeal flora.  HOSPITAL COURSE:  The patient was admitted after being started on supplemental O2 at 3 L per minute.  In addition, the patient was started on Tequin 400 mg IV daily for presumed bronchopneumonia, and treated with Lasix for any changes of pulmonary vascular congestion.  He was started back on Lotensin at 10 mg daily, Cardizem was given at 5 mg IV initially to get his blood pressure down, and then 180 mg p.o. daily, although he did  have elevations in his CPK and tro levels, he had evolutionary changes on EKG suggesting acute myocardial infarction.  The patient was seen in consultation by Dr. Riley Kill of cardiology service who felt that at such a young age, that the patient should be treated aggressively for his hypertension.  His Lotensin was increased to 20 mg daily, and it was Dr. Louretta Shorten recommendation to use Coreg in place of the calcium channel blocker.  The patient had no further complications  during this hospitalization.  His blood pressure ranged from 110 to 130 on the systolic side, and 80s diastolic in the last few days that he was up and about.  A Cardiolite study was planned during his hospitalization, however, for technical reasons the patient was not done on the scheduled day, and it was felt that this could be arranged as an outpatient.  The patient was then subsequently discharged in a condition that was significantly improved.  He was made quite aware of the importance of controlling his blood pressure.  DISCHARGE MEDICATIONS: 1. Lasix 40 mg q.d. 2. Coreg 6.25 mg b.i.d. 3. Lotensin 20 mg q.d. 4. Baby aspirin 81 mg q.d. 5. Tequin 400 mg q.d. x6 days.  The patient was scheduled to have a repeat chest x-ray two weeks post discharge.  DIET:  A 3 g sodium, modified fat restricted.  FOLLOWUP: 1. He is scheduled to be seen in my office in three weeks. 2. He will be seen by Dr. Riley Kill for Cardiolite study.  DISCHARGE PROGNOSIS:  Fair. Dictated by:   Lindell Spar. Chestine Spore, M.D. Attending Physician:  Virgia Land DD:  12/07/01 TD:  12/08/01 Job: [id number]

## 2011-04-22 ENCOUNTER — Telehealth: Payer: Self-pay | Admitting: Cardiology

## 2011-04-22 NOTE — Telephone Encounter (Signed)
Patient will see Dr. [name] 05/03/11. Left VMTCB to schedule an echocardiogram per Dr. [name].  Floreen Comber, 5:08 PM

## 2011-05-03 ENCOUNTER — Ambulatory Visit: Payer: Self-pay | Admitting: Internal Medicine

## 2011-05-13 ENCOUNTER — Ambulatory Visit: Payer: Self-pay | Admitting: Cardiology

## 2011-05-21 ENCOUNTER — Encounter: Payer: Self-pay | Admitting: Internal Medicine

## 2011-06-05 ENCOUNTER — Emergency Department (HOSPITAL_COMMUNITY): Payer: Self-pay

## 2011-06-05 ENCOUNTER — Emergency Department (HOSPITAL_COMMUNITY)
Admission: EM | Admit: 2011-06-05 | Discharge: 2011-06-05 | Disposition: A | Discharge status: Home / Self Care | Payer: Self-pay | Attending: Emergency Medicine | Admitting: Emergency Medicine

## 2011-06-05 DIAGNOSIS — M7989 Other specified soft tissue disorders: Secondary | ICD-10-CM | POA: Insufficient documentation

## 2011-06-05 DIAGNOSIS — R059 Cough, unspecified: Secondary | ICD-10-CM | POA: Insufficient documentation

## 2011-06-05 DIAGNOSIS — R05 Cough: Secondary | ICD-10-CM | POA: Insufficient documentation

## 2011-06-05 DIAGNOSIS — R0602 Shortness of breath: Secondary | ICD-10-CM | POA: Insufficient documentation

## 2011-06-05 DIAGNOSIS — I509 Heart failure, unspecified: Secondary | ICD-10-CM | POA: Insufficient documentation

## 2011-06-05 DIAGNOSIS — I10 Essential (primary) hypertension: Secondary | ICD-10-CM | POA: Insufficient documentation

## 2011-06-05 DIAGNOSIS — I517 Cardiomegaly: Secondary | ICD-10-CM | POA: Insufficient documentation

## 2011-06-05 LAB — CBC
HCT: 39.1 % (ref 39.0–52.0)
Hemoglobin: 14.1 g/dL (ref 13.0–17.0)
MCH: 30.6 pg (ref 26.0–34.0)
RBC: 4.61 MIL/uL (ref 4.22–5.81)

## 2011-06-05 LAB — BASIC METABOLIC PANEL
BUN: 13 mg/dL (ref 6–23)
CO2: 24 mEq/L (ref 19–32)
Glucose, Bld: 116 mg/dL — ABNORMAL HIGH (ref 70–99)
Potassium: 3.3 mEq/L — ABNORMAL LOW (ref 3.5–5.1)
Sodium: 139 mEq/L (ref 135–145)

## 2011-06-17 ENCOUNTER — Emergency Department (HOSPITAL_COMMUNITY): Payer: Self-pay

## 2011-06-17 ENCOUNTER — Emergency Department (HOSPITAL_COMMUNITY)
Admission: EM | Admit: 2011-06-17 | Discharge: 2011-06-17 | Disposition: A | Discharge status: Home / Self Care | Payer: Self-pay | Attending: Emergency Medicine | Admitting: Emergency Medicine

## 2011-06-17 DIAGNOSIS — I517 Cardiomegaly: Secondary | ICD-10-CM | POA: Insufficient documentation

## 2011-06-17 DIAGNOSIS — I10 Essential (primary) hypertension: Secondary | ICD-10-CM | POA: Insufficient documentation

## 2011-06-17 DIAGNOSIS — R05 Cough: Secondary | ICD-10-CM | POA: Insufficient documentation

## 2011-06-17 DIAGNOSIS — R599 Enlarged lymph nodes, unspecified: Secondary | ICD-10-CM | POA: Insufficient documentation

## 2011-06-17 DIAGNOSIS — R9431 Abnormal electrocardiogram [ECG] [EKG]: Secondary | ICD-10-CM | POA: Insufficient documentation

## 2011-06-17 DIAGNOSIS — R059 Cough, unspecified: Secondary | ICD-10-CM | POA: Insufficient documentation

## 2011-06-17 DIAGNOSIS — I491 Atrial premature depolarization: Secondary | ICD-10-CM | POA: Insufficient documentation

## 2011-06-17 DIAGNOSIS — I509 Heart failure, unspecified: Secondary | ICD-10-CM | POA: Insufficient documentation

## 2011-06-17 LAB — BASIC METABOLIC PANEL
BUN: 15 mg/dL (ref 6–23)
Creatinine, Ser: 1.04 mg/dL (ref 0.50–1.35)
GFR calc Af Amer: >60 mL/min (ref >60)
GFR calc non Af Amer: >60 mL/min (ref >60)
Glucose, Bld: 106 mg/dL — ABNORMAL HIGH (ref 70–99)

## 2011-06-17 LAB — DIFFERENTIAL
Eosinophils Absolute: 0.2 10*3/uL (ref 0.0–0.7)
Eosinophils Relative: 2 % (ref 0–5)
Lymphs Abs: 3.9 10*3/uL (ref 0.7–4.0)
Monocytes Absolute: 0.8 10*3/uL (ref 0.1–1.0)
Monocytes Relative: 10 % (ref 3–12)

## 2011-06-17 LAB — CBC
HCT: 40.4 % (ref 39.0–52.0)
MCH: 30.4 pg (ref 26.0–34.0)
MCHC: 35.6 g/dL (ref 30.0–36.0)
MCV: 85.4 fL (ref 78.0–100.0)
Platelets: 247 10*3/uL (ref 150–400)
RDW: 13.7 % (ref 11.5–15.5)

## 2011-06-17 MED ORDER — IOHEXOL 350 MG/ML SOLN: 100.0000 mL | Freq: Once | INTRAVENOUS | Status: DC | PRN

## 2011-06-21 ENCOUNTER — Encounter (HOSPITAL_COMMUNITY): Payer: Self-pay

## 2011-06-21 ENCOUNTER — Ambulatory Visit (HOSPITAL_COMMUNITY)
Admission: RE | Admit: 2011-06-21 | Discharge: 2011-06-21 | Disposition: A | Discharge status: Home / Self Care | Payer: Self-pay | Source: Ambulatory Visit | Attending: Internal Medicine | Admitting: Internal Medicine

## 2011-06-21 VITALS — BP 142/90 | HR 91 | Ht 76.0 in | Wt 270.0 lb

## 2011-06-21 DIAGNOSIS — I10 Essential (primary) hypertension: Secondary | ICD-10-CM | POA: Insufficient documentation

## 2011-06-21 DIAGNOSIS — I509 Heart failure, unspecified: Secondary | ICD-10-CM | POA: Insufficient documentation

## 2011-06-21 DIAGNOSIS — I5022 Chronic systolic (congestive) heart failure: Secondary | ICD-10-CM | POA: Insufficient documentation

## 2011-06-21 DIAGNOSIS — I428 Other cardiomyopathies: Secondary | ICD-10-CM | POA: Insufficient documentation

## 2011-06-21 DIAGNOSIS — I5042 Chronic combined systolic (congestive) and diastolic (congestive) heart failure: Secondary | ICD-10-CM | POA: Insufficient documentation

## 2011-06-21 LAB — BASIC METABOLIC PANEL
BUN: 17 mg/dL (ref 6–23)
Chloride: 104 mEq/L (ref 96–112)
Creatinine, Ser: 1.02 mg/dL (ref 0.50–1.35)
GFR calc Af Amer: >60 mL/min (ref >60)
GFR calc non Af Amer: >60 mL/min (ref >60)
Glucose, Bld: 108 mg/dL — ABNORMAL HIGH (ref 70–99)

## 2011-06-21 MED ORDER — FUROSEMIDE 40 MG PO TABS: 40.0000 mg | ORAL_TABLET | Freq: Two times a day (BID) | ORAL | Status: DC

## 2011-06-21 MED ORDER — POTASSIUM CHLORIDE CRYS ER 20 MEQ PO TBCR: 20.0000 meq | EXTENDED_RELEASE_TABLET | Freq: Two times a day (BID) | ORAL | Status: DC

## 2011-06-21 MED ORDER — CARVEDILOL 3.125 MG PO TABS: 3.1250 mg | ORAL_TABLET | Freq: Two times a day (BID) | ORAL | Status: DC

## 2011-06-21 MED ORDER — LISINOPRIL 10 MG PO TABS: 10.0000 mg | ORAL_TABLET | Freq: Every day | ORAL | Status: DC

## 2011-06-21 NOTE — Progress Notes (Addendum)
History of Present Illness:  Douglas [name] is a 35 y.o. male with a h/o HTN, HL, obesity and CHF due to NICM. Management has been complicated by severe non-compliance.   Underwent cath in 2006 which showed normal cors with EF 20-25%. Saw Dr. [name] in 2009 but has been lost to f/u since. Just out of prison Feb 2012 (for 1 year)  Saw [name] in the [place] office in April at which time he was out of all of his medications for several months. Had significant volume overload and BP uncontrolled. HF meds restarted. Referred to HF clinic for management. Seen last week in ER due to coughing and felt like his weight was up and he had SOB (notes unavailable) apparently treated for volume overload and referred back here.   Currently only taking lasix and potassium daily. He ran out of medications  Not current weighing but says he can tell when he has edema. 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  Reports lower extremity edema. No abdominal fullness.  Says he is trying to limit salt intake. Smoking one cigarette a day and drinks alcohol on occasion. No palpitations or syncope.  Past Medical History  Diagnosis Date  . Hypertension   . Hyperlipidemia   . Systolic CHF, chronic   . Obesity   . Nonischemic cardiomyopathy     a.  echo 4/06: EF 30%, mild to mod MR, mild RAE, inf HK, lat HK , ant HK;    b.  cath 4/06: no CAD, EF 20-25%  . ED (erectile dysfunction)   . NSVT (nonsustained ventricular tachycardia)     eval by EP in past; no ICD candidate due to NYHA 1 symptoms    Current Outpatient Prescriptions  Medication Sig Dispense Refill  . furosemide (LASIX) 40 MG tablet Take 40 mg by mouth 2 (two) times daily.        . potassium chloride SA (K-DUR,KLOR-CON) 20 MEQ tablet Take 20 mEq by mouth 2 (two) times daily.        Marland Kitchen aspirin 81 MG tablet Take 81 mg by mouth daily.        . carvedilol (COREG) 6.25 MG tablet Take 1  tablet (6.25 mg total) by mouth 2 (two) times daily with a meal.  60 tablet  11  . lisinopril (PRINIVIL,ZESTRIL) 10 MG tablet Take 1 tablet (10 mg total) by mouth daily.  30 tablet  11  . DISCONTD: furosemide (LASIX) 40 MG tablet Take 1 tablet (40 mg total) by mouth daily.  30 tablet  11  . DISCONTD: potassium chloride SA (K-DUR,KLOR-CON) 20 MEQ tablet Take 1 tablet (20 mEq total) by mouth daily.  30 tablet  11    Allergies  Allergen Reactions  . Penicillins     History  Substance Use Topics  . Smoking status: Former Smoker -- 0.3 packs/day    Types: Cigarettes  . Smokeless tobacco: Former Neurosurgeon    Quit date: 06/20/2011  . Alcohol Use: 0.5 oz/week    1 drink(s) per week    Family History  Problem Relation Age of Onset  . Coronary artery disease Mother 31    s/p PCI  . Lung cancer Father   . Diabetes type II Maternal Uncle   . Coronary artery disease Maternal Uncle     ROS:  Please see the history of present illness.  He denies fevers, chills, melena, hematochezia.  All other systems reviewed and negative.  Vital Signs: BP 142/90  Pulse 91  Ht 6\' 4"  (1.93 m)  Wt 270 lb (122.471 kg)  BMI 32.87 kg/m2  PHYSICAL EXAM: Well nourished, well developed, in no acute distress HEENT: normal Neck: JVP hard to see about 9. Carotids 2+ bilaterally.No Bruits  Cardiac:  PMI laterally displaced. RRR with prominent  S3. 2/6 SEM at apex Lungs:  clear to auscultation bilaterally, no wheezing, rhonchi or rales Ab: obese. Soft NT/ND. No bruits. No HSM. Ext warm. 1-2+ edemano cyanosis or clubbing Skin: warm and dry Psych:normal affect. CNII-XII grossly intact  ASSESSMENT AND PLAN:

## 2011-06-21 NOTE — Patient Instructions (Addendum)
Restart Carvedilol 3.125 mg Twice daily  Restart Lisinopril 10 mg daily  Labs today  Your physician recommends that you schedule a follow-up appointment in: 2 weeks  Your physician has requested that you have an echocardiogram. Echocardiography is a painless test that uses sound waves to create images of your heart. It provides your doctor with information about the size and shape of your heart and how well your heart's chambers and valves are working. This procedure takes approximately one hour. There are no restrictions for this procedure.

## 2011-06-21 NOTE — Assessment & Plan Note (Addendum)
Overall improved with recent diuresis. Currently NYHA III with mild volume overload and prominent s3. Hopefully, now that he is out of prison we will be able to have closer f/u with him and titrate his regimen aggressively. Will restart Lisinpril 10mg  daily and Carvedilol 3.125mg  BID. Will obtain BMET  And BNP. Will order ECHO. He will be followed weekly. Once meds titrated need to consider ICD if EF remains depressed which I suspect it will. Will likely also need CPX.   Patient seen and examined with Tonye Becket, NP. We discussed all aspects of the encounter. I agree with the assessment and plan as stated above.

## 2011-06-22 ENCOUNTER — Encounter (HOSPITAL_COMMUNITY): Payer: Self-pay

## 2011-07-05 ENCOUNTER — Ambulatory Visit (HOSPITAL_COMMUNITY): Payer: Self-pay

## 2011-07-06 ENCOUNTER — Ambulatory Visit (HOSPITAL_COMMUNITY): Payer: Self-pay

## 2011-07-08 ENCOUNTER — Ambulatory Visit (HOSPITAL_COMMUNITY)
Admission: RE | Admit: 2011-07-08 | Discharge: 2011-07-08 | Disposition: A | Discharge status: Home / Self Care | Payer: Self-pay | Source: Ambulatory Visit | Attending: Internal Medicine | Admitting: Internal Medicine

## 2011-07-08 VITALS — BP 143/96 | HR 93 | Wt 272.0 lb

## 2011-07-08 DIAGNOSIS — I10 Essential (primary) hypertension: Secondary | ICD-10-CM | POA: Insufficient documentation

## 2011-07-08 DIAGNOSIS — I5022 Chronic systolic (congestive) heart failure: Secondary | ICD-10-CM | POA: Insufficient documentation

## 2011-07-08 DIAGNOSIS — I059 Rheumatic mitral valve disease, unspecified: Secondary | ICD-10-CM

## 2011-07-08 LAB — BASIC METABOLIC PANEL
BUN: 16 mg/dL (ref 6–23)
Chloride: 103 mEq/L (ref 96–112)
Creatinine, Ser: 1.13 mg/dL (ref 0.50–1.35)
GFR calc Af Amer: >60 mL/min (ref >60)
Glucose, Bld: 104 mg/dL — ABNORMAL HIGH (ref 70–99)
Potassium: 4.4 mEq/L (ref 3.5–5.1)

## 2011-07-08 NOTE — Assessment & Plan Note (Addendum)
NYHA II-III with mild volume overload. He has not started his Lisinopril 10 mg  daily or his Carvedilol 3.125mg  BID  because he did not obtain the medication from [facility] pharmacy. [facility] pharmacy was called and it will be prepared again today however he is unable pay for his medications. Pharmacy will provide a 30 day supply of  lisinopril and carvedilol Once medication titrated will again need to consider ICD if EF is depressed. Will obtain BMET and BNP.He will be followed weekly.

## 2011-07-08 NOTE — Assessment & Plan Note (Signed)
BP markedly elevated - restarting meds (see CHF discussion).

## 2011-07-08 NOTE — Patient Instructions (Signed)
Labs today  Your physician recommends that you schedule a follow-up appointment in: 1 week  

## 2011-07-15 ENCOUNTER — Encounter (HOSPITAL_COMMUNITY): Payer: Self-pay

## 2011-07-15 ENCOUNTER — Ambulatory Visit (HOSPITAL_COMMUNITY)
Admission: RE | Admit: 2011-07-15 | Discharge: 2011-07-15 | Disposition: A | Discharge status: Home / Self Care | Payer: Self-pay | Source: Ambulatory Visit | Attending: Internal Medicine | Admitting: Internal Medicine

## 2011-07-15 DIAGNOSIS — I5022 Chronic systolic (congestive) heart failure: Secondary | ICD-10-CM | POA: Insufficient documentation

## 2011-07-15 MED ORDER — SPIRONOLACTONE 25 MG PO TABS: 25.0000 mg | ORAL_TABLET | Freq: Every day | ORAL | Status: DC

## 2011-07-15 NOTE — Assessment & Plan Note (Addendum)
NYHA III. Volume status elevated. Taking medications.  Start Spironolactone 25mg  daily.  He is instructed to weigh daily and record. Scales provided. Will follow up next week.

## 2011-07-15 NOTE — Patient Instructions (Signed)
Stop Potassium Start Spironolactone 25 mg daily  Your physician recommends that you schedule a follow-up appointment in: 1 week with labs

## 2011-07-22 ENCOUNTER — Ambulatory Visit (HOSPITAL_COMMUNITY): Payer: Self-pay

## 2011-08-02 ENCOUNTER — Ambulatory Visit (HOSPITAL_COMMUNITY)
Admission: RE | Admit: 2011-08-02 | Discharge: 2011-08-02 | Disposition: A | Discharge status: Home / Self Care | Payer: Self-pay | Source: Ambulatory Visit | Attending: Internal Medicine | Admitting: Internal Medicine

## 2011-08-02 VITALS — BP 106/68 | HR 90 | Wt 278.5 lb

## 2011-08-02 DIAGNOSIS — I509 Heart failure, unspecified: Secondary | ICD-10-CM | POA: Insufficient documentation

## 2011-08-02 DIAGNOSIS — I428 Other cardiomyopathies: Secondary | ICD-10-CM

## 2011-08-02 DIAGNOSIS — I5022 Chronic systolic (congestive) heart failure: Secondary | ICD-10-CM | POA: Insufficient documentation

## 2011-08-02 DIAGNOSIS — I10 Essential (primary) hypertension: Secondary | ICD-10-CM

## 2011-08-02 DIAGNOSIS — N529 Male erectile dysfunction, unspecified: Secondary | ICD-10-CM | POA: Insufficient documentation

## 2011-08-02 LAB — BASIC METABOLIC PANEL
BUN: 12 mg/dL (ref 6–23)
Chloride: 104 mEq/L (ref 96–112)
Creatinine, Ser: 0.99 mg/dL (ref 0.50–1.35)
GFR calc Af Amer: >60 mL/min (ref >60)
Glucose, Bld: 93 mg/dL (ref 70–99)
Potassium: 4.9 mEq/L (ref 3.5–5.1)

## 2011-08-02 MED ORDER — SPIRONOLACTONE 25 MG PO TABS: 25.0000 mg | ORAL_TABLET | Freq: Every day | ORAL | Status: DC

## 2011-08-02 MED ORDER — CARVEDILOL 6.25 MG PO TABS: 6.2500 mg | ORAL_TABLET | Freq: Two times a day (BID) | ORAL | Status: DC

## 2011-08-02 MED ORDER — FUROSEMIDE 40 MG PO TABS: 40.0000 mg | ORAL_TABLET | Freq: Two times a day (BID) | ORAL | Status: DC

## 2011-08-02 MED ORDER — LISINOPRIL 10 MG PO TABS: 10.0000 mg | ORAL_TABLET | Freq: Every day | ORAL | Status: DC

## 2011-08-02 NOTE — Progress Notes (Signed)
History of Present Illness:  Douglas [name] is a 35 y.o. male with a h/o HTN, HL, obesity and CHF due to NICM. Management has been complicated by severe non-compliance.   Underwent cath in 2006 which showed normal cors with EF 20-25%. Saw Dr. [name] in 2009 but has been lost to f/u since. Just out of prison Feb 2012 (for 1 year)  Saw [name] in the [place] office in April at which time he was out of all of his medications for several months. Had significant volume overload and BP uncontrolled. HF meds restarted. Referred to HF clinic for management. Seen last week in ER due to coughing and felt like his weight was up and he had SOB (notes unavailable) apparently treated for volume overload and referred back here.     August 2012 CPX text VO2 20  He is taking all medications.  Limiting fluid less 2 liters. His weight at home 273-275. He is weighing daily.  He has taken one extra 1/2 Lasix. He is not SOB on exertion. Able walk a mile without getting SOB. Currently working. Sleeps on 2 pillows.  Leg edema decreased. No abdominal fullness.  Smoking one cigarette a day and drinks alcohol on occasion. He complains of erectile dysfunction after resuming coreg. He has tried viagra in past but to expensive.    Past Medical History  Diagnosis Date  . Hypertension   . Hyperlipidemia   . Systolic CHF, chronic   . Obesity   . Nonischemic cardiomyopathy     a.  echo 4/06: EF 30%, mild to mod MR, mild RAE, inf HK, lat HK , ant HK;    b.  cath 4/06: no CAD, EF 20-25%  . ED (erectile dysfunction)   . NSVT (nonsustained ventricular tachycardia)     eval by EP in past; no ICD candidate due to NYHA 1 symptoms    Current Outpatient Prescriptions  Medication Sig Dispense Refill  . aspirin 81 MG tablet Take 81 mg by mouth daily.        . carvedilol (COREG) 3.125 MG tablet Take 1 tablet (3.125 mg total) by mouth 2 (two) times daily with a meal.  60 tablet  6  . furosemide (LASIX) 40 MG tablet  Take 1 tablet (40 mg total) by mouth 2 (two) times daily.  60 tablet  6  . lisinopril (PRINIVIL,ZESTRIL) 10 MG tablet Take 1 tablet (10 mg total) by mouth daily.  30 tablet  6  . spironolactone (ALDACTONE) 25 MG tablet Take 1 tablet (25 mg total) by mouth daily.  30 tablet  6  . DISCONTD: furosemide (LASIX) 40 MG tablet Take 1 tablet (40 mg total) by mouth daily.  30 tablet  11    Allergies  Allergen Reactions  . Penicillins     History  Substance Use Topics  . Smoking status: Former Smoker -- 0.3 packs/day    Types: Cigarettes  . Smokeless tobacco: Former Neurosurgeon    Quit date: 06/20/2011  . Alcohol Use: 0.5 oz/week    1 drink(s) per week    Family History  Problem Relation Age of Onset  . Coronary artery disease Mother 39    s/p PCI  . Lung cancer Father   . Diabetes type II Maternal Uncle   . Coronary artery disease Maternal Uncle     ROS:  Please see the history of present illness.  He denies fevers, chills, melena, hematochezia.  All other systems reviewed and negative.  Vital Signs:  BP 106/68  Pulse 90  Wt 278 lb 8 oz (126.327 kg)  PHYSICAL EXAM: Well nourished, well developed, in no acute distress HEENT: normal Neck: JVP hard to see 5-6. Carotids 2+ bilaterally.No Bruits  Cardiac:  PMI laterally displaced. RRR with prominent  S3. 2/6 Lungs:  clear to auscultation bilaterally, no wheezing, rhonchi or rales Ab: obese. Soft NT/ND. No bruits.. Ext warm. no cyanosis or clubbing, no edema Skin: warm and dry Psych:normal affect. CNII-XII grossly intact  ASSESSMENT AND PLAN:

## 2011-08-02 NOTE — Assessment & Plan Note (Addendum)
Improved. NYHA II-III. Volume status stable. Recent CPX looked good. Taking medications. Will increase Carvedilol to 6.25mg  BID. Will  Check BMET. Will follow up 2 weeks.   Patient seen and examined with Tonye Becket, NP. We discussed all aspects of the encounter. I agree with the assessment and plan as stated above.

## 2011-08-02 NOTE — Assessment & Plan Note (Signed)
He is having problems with ED which is exacerbated by his carvedilol. Will continue carvedilol and give him sildenafil, though we are trying to help him figure out cost issues.

## 2011-08-02 NOTE — Patient Instructions (Signed)
Increase Carvedilol to 6.25 mg Twice daily   Labs today  Your physician recommends that you schedule a follow-up appointment in: 2 weeks  

## 2011-08-03 ENCOUNTER — Telehealth (HOSPITAL_COMMUNITY): Payer: Self-pay | Admitting: *Deleted

## 2011-08-03 MED ORDER — SILDENAFIL CITRATE 100 MG PO TABS: 100.0000 mg | ORAL_TABLET | Freq: Every day | ORAL | Status: DC | PRN

## 2011-08-03 NOTE — Telephone Encounter (Signed)
Per Tonye Becket, NP pt needs prescription for viagra 100 mg sent to pharmacy, pt is aware rx sent to pharmacy

## 2011-08-16 ENCOUNTER — Encounter (HOSPITAL_COMMUNITY): Payer: Self-pay

## 2011-08-25 LAB — GC/CHLAMYDIA PROBE AMP, GENITAL: Chlamydia, DNA Probe: NEGATIVE

## 2011-09-19 ENCOUNTER — Emergency Department (HOSPITAL_COMMUNITY): Payer: Self-pay

## 2011-09-19 ENCOUNTER — Encounter (HOSPITAL_COMMUNITY): Payer: Self-pay | Admitting: *Deleted

## 2011-09-19 ENCOUNTER — Inpatient Hospital Stay (HOSPITAL_COMMUNITY)
Admission: EM | Admit: 2011-09-19 | Discharge: 2011-09-25 | DRG: 287 | Disposition: A | Discharge status: Home / Self Care | Payer: MEDICAID | Attending: Internal Medicine | Admitting: Internal Medicine

## 2011-09-19 ENCOUNTER — Other Ambulatory Visit: Payer: Self-pay

## 2011-09-19 DIAGNOSIS — E785 Hyperlipidemia, unspecified: Secondary | ICD-10-CM | POA: Diagnosis present

## 2011-09-19 DIAGNOSIS — Z91199 Patient's noncompliance with other medical treatment and regimen due to unspecified reason: Secondary | ICD-10-CM

## 2011-09-19 DIAGNOSIS — I509 Heart failure, unspecified: Secondary | ICD-10-CM

## 2011-09-19 DIAGNOSIS — R05 Cough: Secondary | ICD-10-CM | POA: Diagnosis present

## 2011-09-19 DIAGNOSIS — R059 Cough, unspecified: Secondary | ICD-10-CM | POA: Diagnosis present

## 2011-09-19 DIAGNOSIS — I5023 Acute on chronic systolic (congestive) heart failure: Secondary | ICD-10-CM

## 2011-09-19 DIAGNOSIS — E669 Obesity, unspecified: Secondary | ICD-10-CM | POA: Diagnosis present

## 2011-09-19 DIAGNOSIS — Z7982 Long term (current) use of aspirin: Secondary | ICD-10-CM

## 2011-09-19 DIAGNOSIS — R109 Unspecified abdominal pain: Secondary | ICD-10-CM | POA: Diagnosis present

## 2011-09-19 DIAGNOSIS — I4729 Other ventricular tachycardia: Secondary | ICD-10-CM | POA: Diagnosis present

## 2011-09-19 DIAGNOSIS — Z9119 Patient's noncompliance with other medical treatment and regimen: Secondary | ICD-10-CM

## 2011-09-19 DIAGNOSIS — R748 Abnormal levels of other serum enzymes: Secondary | ICD-10-CM

## 2011-09-19 DIAGNOSIS — J811 Chronic pulmonary edema: Secondary | ICD-10-CM

## 2011-09-19 DIAGNOSIS — I472 Ventricular tachycardia, unspecified: Secondary | ICD-10-CM | POA: Diagnosis present

## 2011-09-19 DIAGNOSIS — Z79899 Other long term (current) drug therapy: Secondary | ICD-10-CM

## 2011-09-19 DIAGNOSIS — I10 Essential (primary) hypertension: Secondary | ICD-10-CM

## 2011-09-19 DIAGNOSIS — F17200 Nicotine dependence, unspecified, uncomplicated: Secondary | ICD-10-CM | POA: Diagnosis present

## 2011-09-19 DIAGNOSIS — I5043 Acute on chronic combined systolic (congestive) and diastolic (congestive) heart failure: Principal | ICD-10-CM | POA: Diagnosis present

## 2011-09-19 DIAGNOSIS — I2510 Atherosclerotic heart disease of native coronary artery without angina pectoris: Secondary | ICD-10-CM | POA: Diagnosis present

## 2011-09-19 DIAGNOSIS — I428 Other cardiomyopathies: Secondary | ICD-10-CM

## 2011-09-19 LAB — CARDIAC PANEL(CRET KIN+CKTOT+MB+TROPI)
Relative Index: 1.5 (ref 0.0–2.5)
Troponin I: 0.63 ng/mL (ref <0.30)

## 2011-09-19 LAB — DIFFERENTIAL
Basophils Absolute: 0 10*3/uL (ref 0.0–0.1)
Lymphocytes Relative: 38 % (ref 12–46)
Neutro Abs: 3.1 10*3/uL (ref 1.7–7.7)

## 2011-09-19 LAB — CBC
HCT: 40.6 % (ref 39.0–52.0)
Platelets: 207 10*3/uL (ref 150–400)
RDW: 15 % (ref 11.5–15.5)
WBC: 6.4 10*3/uL (ref 4.0–10.5)

## 2011-09-19 LAB — BASIC METABOLIC PANEL
Calcium: 9.1 mg/dL (ref 8.4–10.5)
Chloride: 105 mEq/L (ref 96–112)
Creatinine, Ser: 1.15 mg/dL (ref 0.50–1.35)
GFR calc Af Amer: >90 mL/min (ref >90)
Sodium: 139 mEq/L (ref 135–145)

## 2011-09-19 MED ORDER — SODIUM CHLORIDE 0.9 % IJ SOLN
3.0000 mL | Freq: Two times a day (BID) | INTRAMUSCULAR | Status: DC
  Administered 2011-09-21 – 2011-09-24 (×3): 3 mL via INTRAVENOUS

## 2011-09-19 MED ORDER — MAGNESIUM SULFATE 40 MG/ML IJ SOLN
2.0000 g | Freq: Once | INTRAMUSCULAR | Status: AC
  Administered 2011-09-19: 2 g via INTRAVENOUS
  Filled 2011-09-19: qty 100

## 2011-09-19 MED ORDER — POTASSIUM CHLORIDE CRYS ER 20 MEQ PO TBCR
20.0000 meq | EXTENDED_RELEASE_TABLET | Freq: Two times a day (BID) | ORAL | Status: DC
  Administered 2011-09-20 – 2011-09-23 (×7): 20 meq via ORAL
  Filled 2011-09-19 (×14): qty 1

## 2011-09-19 MED ORDER — FUROSEMIDE 10 MG/ML IJ SOLN
80.0000 mg | Freq: Two times a day (BID) | INTRAMUSCULAR | Status: DC
  Administered 2011-09-20: 80 mg via INTRAVENOUS
  Administered 2011-09-20: 40 mg via INTRAVENOUS
  Administered 2011-09-21 (×2): 80 mg via INTRAVENOUS
  Filled 2011-09-19 (×5): qty 8
  Filled 2011-09-19: qty 4

## 2011-09-19 MED ORDER — MAGNESIUM SULFATE 40 MG/ML IJ SOLN
2.0000 g | Freq: Once | INTRAMUSCULAR | Status: DC
  Filled 2011-09-19: qty 50

## 2011-09-19 MED ORDER — NITROGLYCERIN 0.4 MG SL SUBL
0.4000 mg | SUBLINGUAL_TABLET | SUBLINGUAL | Status: DC | PRN
  Filled 2011-09-19: qty 25

## 2011-09-19 MED ORDER — ASPIRIN 81 MG PO CHEW
324.0000 mg | CHEWABLE_TABLET | Freq: Once | ORAL | Status: AC
  Administered 2011-09-19: 324 mg via ORAL
  Filled 2011-09-19: qty 4

## 2011-09-19 MED ORDER — SODIUM CHLORIDE 0.9 % IV SOLN: 250.0000 mL | INTRAVENOUS | Status: DC

## 2011-09-19 MED ORDER — MAGNESIUM SULFATE 50 % IJ SOLN
2.0000 g | Freq: Once | INTRAMUSCULAR | Status: DC
  Filled 2011-09-19: qty 4

## 2011-09-19 MED ORDER — SPIRONOLACTONE 25 MG PO TABS
25.0000 mg | ORAL_TABLET | Freq: Every day | ORAL | Status: DC
  Administered 2011-09-20 – 2011-09-25 (×6): 25 mg via ORAL
  Filled 2011-09-19 (×8): qty 1

## 2011-09-19 MED ORDER — LISINOPRIL 10 MG PO TABS
10.0000 mg | ORAL_TABLET | Freq: Every day | ORAL | Status: DC
  Administered 2011-09-20 – 2011-09-21 (×2): 10 mg via ORAL
  Filled 2011-09-19 (×4): qty 1

## 2011-09-19 MED ORDER — CARVEDILOL 6.25 MG PO TABS
6.2500 mg | ORAL_TABLET | Freq: Two times a day (BID) | ORAL | Status: DC
  Administered 2011-09-20 – 2011-09-21 (×3): 6.25 mg via ORAL
  Filled 2011-09-19 (×11): qty 1

## 2011-09-19 MED ORDER — FUROSEMIDE 10 MG/ML IJ SOLN: 40.0000 mg | Freq: Once | INTRAMUSCULAR | Status: DC

## 2011-09-19 MED ORDER — HEPARIN SODIUM (PORCINE) 5000 UNIT/ML IJ SOLN
5000.0000 [IU] | Freq: Three times a day (TID) | INTRAMUSCULAR | Status: DC
  Administered 2011-09-20 – 2011-09-25 (×15): 5000 [IU] via SUBCUTANEOUS
  Filled 2011-09-19 (×7): qty 1

## 2011-09-19 MED ORDER — ACETAMINOPHEN 325 MG PO TABS
650.0000 mg | ORAL_TABLET | ORAL | Status: DC | PRN
  Filled 2011-09-19: qty 2

## 2011-09-19 MED ORDER — ONDANSETRON HCL 4 MG/2ML IJ SOLN: 4.0000 mg | Freq: Four times a day (QID) | INTRAMUSCULAR | Status: DC | PRN

## 2011-09-19 MED ORDER — MAGNESIUM SULFATE 40 MG/ML IJ SOLN
2.0000 g | Freq: Once | INTRAMUSCULAR | Status: AC
  Administered 2011-09-19: 2 g via INTRAVENOUS
  Filled 2011-09-19: qty 50

## 2011-09-19 MED ORDER — FUROSEMIDE 10 MG/ML IJ SOLN
40.0000 mg | Freq: Once | INTRAMUSCULAR | Status: DC
  Filled 2011-09-19: qty 4

## 2011-09-19 MED ORDER — DIGOXIN 250 MCG PO TABS
0.2500 mg | ORAL_TABLET | Freq: Every day | ORAL | Status: DC
  Administered 2011-09-20 – 2011-09-25 (×6): 0.25 mg via ORAL
  Filled 2011-09-19 (×8): qty 1

## 2011-09-19 MED ORDER — ASPIRIN 81 MG PO CHEW
81.0000 mg | CHEWABLE_TABLET | Freq: Every day | ORAL | Status: DC
  Administered 2011-09-21 – 2011-09-25 (×5): 81 mg via ORAL
  Filled 2011-09-19 (×7): qty 1

## 2011-09-19 MED ORDER — SODIUM CHLORIDE 0.9 % IJ SOLN: 3.0000 mL | INTRAMUSCULAR | Status: DC | PRN

## 2011-09-19 MED ORDER — ZOLPIDEM TARTRATE 5 MG PO TABS: 10.0000 mg | ORAL_TABLET | Freq: Every evening | ORAL | Status: DC | PRN

## 2011-09-19 NOTE — ED Notes (Signed)
Patient denies pain and is resting comfortably.  

## 2011-09-19 NOTE — ED Notes (Addendum)
Pt placed on 5 lead cardiac monitor 

## 2011-09-19 NOTE — ED Provider Notes (Addendum)
History     CSN: [id number] Arrival date & time: 09/19/2011 10:06 AM   First MD Initiated Contact with Patient 09/19/11 1120      Chief Complaint  Patient presents with  . Cough   pleasant patient with a known history of CHF. He has been having shortness of breath, exertional dyspnea, occasional chest pain, over the past few days. He has been sleeping in a sitting position. He also has some abdominal pain. He is taking all of his current medications including chloride, Lasix, lisinopril, and Aldactone. His primary cardiologist is Dr. Augustina Mood. He has no primary care doctor Patient has a nonproductive cough for one week. (Consider location/radiation/quality/duration/timing/severity/associated sxs/prior treatment) HPI  Past Medical History  Diagnosis Date  . Hypertension   . Hyperlipidemia   . Systolic CHF, chronic   . Obesity   . Nonischemic cardiomyopathy     a.  echo 4/06: EF 30%, mild to mod MR, mild RAE, inf HK, lat HK , ant HK;    b.  cath 4/06: no CAD, EF 20-25%  . ED (erectile dysfunction)   . NSVT (nonsustained ventricular tachycardia)     eval by EP in past; no ICD candidate due to NYHA 1 symptoms  . CHF (congestive heart failure)     History reviewed. No pertinent past surgical history.  Family History  Problem Relation Age of Onset  . Coronary artery disease Mother 71    s/p PCI  . Lung cancer Father   . Diabetes type II Maternal Uncle   . Coronary artery disease Maternal Uncle     History  Substance Use Topics  . Smoking status: Current Some Day Smoker -- 0.3 packs/day    Types: Cigarettes  . Smokeless tobacco: Former Neurosurgeon    Quit date: 06/20/2011  . Alcohol Use: 0.5 oz/week    1 drink(s) per week      Review of Systems  Respiratory: Positive for cough and shortness of breath.   Cardiovascular: Positive for chest pain and leg swelling.  Genitourinary: Negative for difficulty urinating.  Neurological: Negative for dizziness.  All other systems  reviewed and are negative.    Allergies  Penicillins  Home Medications   Current Outpatient Rx  Name Route Sig Dispense Refill  . ASPIRIN 81 MG PO TABS Oral Take 81 mg by mouth daily.      Marland Kitchen CARVEDILOL 6.25 MG PO TABS Oral Take 1 tablet (6.25 mg total) by mouth 2 (two) times daily with a meal. 60 tablet 6  . FUROSEMIDE 40 MG PO TABS Oral Take 1 tablet (40 mg total) by mouth 2 (two) times daily. 60 tablet 6  . LISINOPRIL 10 MG PO TABS Oral Take 1 tablet (10 mg total) by mouth daily. 30 tablet 6  . OVER THE COUNTER MEDICATION Oral Take by mouth 3 (three) times daily as needed. Robitussin daytime.     . SPIRONOLACTONE 25 MG PO TABS Oral Take 1 tablet (25 mg total) by mouth daily. 30 tablet 6    BP 132/95  Pulse 50  Temp(Src) 97.6 F (36.4 C) (Oral)  Resp 18  Ht 6\' 4"  (1.93 m)  Wt 280 lb (127.007 kg)  BMI 34.08 kg/m2  SpO2 100%  Physical Exam  Constitutional: He is oriented to person, place, and time. He appears well-developed and well-nourished.  HENT:  Head: Normocephalic and atraumatic.  Eyes: Conjunctivae and EOM are normal. Pupils are equal, round, and reactive to light.  Neck: Neck supple.  Cardiovascular: Normal rate  and regular rhythm.  Exam reveals no gallop and no friction rub.   Murmur heard. Pulmonary/Chest: Breath sounds normal. He has no wheezes. He has no rales. He exhibits no tenderness.  Abdominal: Soft. Bowel sounds are normal. He exhibits no distension. There is no tenderness. There is no rebound and no guarding.  Musculoskeletal: Normal range of motion. He exhibits edema.  Neurological: He is alert and oriented to person, place, and time. No cranial nerve deficit. Coordination normal.  Skin: Skin is warm and dry. No rash noted.  Psychiatric: He has a normal mood and affect.    ED Course  Procedures (including critical care time)  Labs Reviewed - No data to display No results found.   No diagnosis found.    MDM  Patient is seen and examined,  initial history and physical is completed. Evaluation initiated        Mikaelah Trostle A. Patrica Duel, MD 09/19/11 1132  11:35 AM  Date: 09/19/2011  Rate: 92  Rhythm: normal sinus rhythm  QRS Axis: normal  Intervals: QT prolonged  ST/T Wave abnormalities: nonspecific ST changes  Conduction Disutrbances:none  Narrative Interpretation:   Old EKG Reviewed: unchanged      Alexius Hangartner A. Patrica Duel, MD 09/19/11 1135   11:40 AM  NAME: GREGROY, DOMBKOWSKI NO.: [id number]  MEDICAL RECORD NO.: [id number] PATIENT TYPE: INP  LOCATION: 3705 FACILITY: [facility]  PHYSICIAN: [name], M.D. DATE OF BIRTH: May 21, 1976  DATE OF ADMISSION: 02/16/2005  DATE OF DISCHARGE: 02/19/2005  DISCHARGE SUMMARY  DISCHARGE DIAGNOSES:  1. Congestive heart failure.  2. Ischemic cardiomyopathy.  3. Pneumonia.  4. Paroxysmal ventricular tachycardia.  5. Systemic hypertension.  6. Exogenous obesity.  7. Type 2 diabetes mellitus.  8. Mixed hyperlipidemia.  REASON FOR ADMISSION: This is one of several [facility] admissions  for Mr. Rondeau, a 35 year old gentleman who presents to the emergency room  with a complaint of shortness of breath and cough for at least 2 weeks'  duration.  PHYSICAL FINDINGS: VITAL SIGNS: Blood pressure 155/95 with a pulse rate of  120, respiratory rate 32, and temperature 101.  HEENT: Head is normocephalic and atraumatic. Pupils equal, round, and  reactive to light and accommodation. Sclerae anicteric and no conjunctival  pallor.  NECK: Supple with no adenopathy. No JVD is noted at 30 degrees.  CHEST: Bilateral right greater than left wheezing and scattered rhonchi  throughout both lung fields. There is no evidence of any consolidation.  HEART: His precordium is 2+ hyperdynamic. S1 and S2 are normal. No  significant murmurs or rubs.  ABDOMEN: Obese, soft, nontender, no organomegaly and no masses.  GENITOURINARY: Normal male.  RECTAL: Deferred.  EXTREMITIES: No clubbing or  cyanosis. Trace to 1+ pedal edema.  EKG; normal sinus rhythm with no deviation of the electrical axis with  increase in QT interval, but otherwise is no acute changes.  Chest x-ray revealed interstitial as well as alveolar edema.  HOSPITAL COURSE: Mr. Warga is admitted with a working diagnosis of  congestive heart failure related to a previous diagnosis of primary  cardiomyopathy. The patient has been quite noncompliant which probably  accounts for his elevated blood pressure and congestive heart failure. Of  note however, is that he had an outpatient history of a fever elevation to  101 and is noted on his admission to the ER to have a low grade fever.  Because of his elevated temperature, blood cultures, sputum cultures, and  urine cultures were also obtained. The patient was started on Lovenox  and  treated with parenteral diuretics for his interstitial pulmonary edema.  Serial enzymes and EKG's were negative for any acute pathology. He did have  one episode of ventricular tachycardia in the hospital, but this was  asymptomatic. The patient was seen in consultation by [facility].  It was their feeling that there was no indication for cardiac  catheterization or Cardiolite study. His workup had been quite extensive  back in 2002 where a Cardiolite ejection fraction was 19% showing severe  global hypokinesis. They too felt that the patient's course to this point  was related to his poor compliance. A two-dimensional echocardiogram  performed at this admission did reveal mild to moderate mitral regurgitation  with a dilated left atrium and again severe septal hypokinesis. Dr. Tenny Craw  felt that the patient should have Vytorin 1040 added to his medical regimen.  A CT scan performed did not show any evidence to suggest a mass lesion or  pulmonary emboli. The primary consideration at the time of discharge was  whether or not the patient needed an ICD. It was our feeling that this  could  be done as an outpatient. The patient will be scheduled to be seen by  Duke Salvia, M.D. or Dr. Ladona Ridgel on his follow-up visit with Pricilla Riffle, M.D.  CONDITION ON DISCHARGE: Significantly improved.  PROGNOSIS: Guarded in view of his poor compliance and multiple risk factors  and in view of his significantly reduced ejection fraction.  The patient is scheduled to be seen back in my office in 3 weeks.  DISCHARGE MEDICATIONS:  1. Avapro 300 mg per day.  2. Coreg 12.5 b.i.d.  3. Furosemide 80 mg once a day.  4. Kay Ciel 20 mEq per day along with Vytorin 1040 once a day.  DIET: He is discharged on a 3 gram sodium, carb-modified, 1800 calorie,  modified fat restricted diet.     Ervie Mccard A. Patrica Duel, MD 09/19/11 1140  12:43 PM Results for orders placed during the hospital encounter of 09/19/11  CARDIAC PANEL(CRET KIN+CKTOT+MB+TROPI)      Component Value Range   Total CK 283 (*) 7 - 232 (U/L)   CK, MB 4.3 (*) 0.3 - 4.0 (ng/mL)   Troponin I 0.63 (*) <0.30 (ng/mL)   Relative Index 1.5  0.0 - 2.5   BASIC METABOLIC PANEL      Component Value Range   Sodium 139  135 - 145 (mEq/L)   Potassium 3.6  3.5 - 5.1 (mEq/L)   Chloride 105  96 - 112 (mEq/L)   CO2 23  19 - 32 (mEq/L)   Glucose, Bld 113 (*) 70 - 99 (mg/dL)   BUN 15  6 - 23 (mg/dL)   Creatinine, Ser 9.14  0.50 - 1.35 (mg/dL)   Calcium 9.1  8.4 - 78.2 (mg/dL)   GFR calc non Af Amer 80 (*) >90 (mL/min)   GFR calc Af Amer >90  >90 (mL/min)  CBC      Component Value Range   WBC 6.4  4.0 - 10.5 (K/uL)   RBC 4.70  4.22 - 5.81 (MIL/uL)   Hemoglobin 14.3  13.0 - 17.0 (g/dL)   HCT 95.6  21.3 - 08.6 (%)   MCV 86.4  78.0 - 100.0 (fL)   MCH 30.4  26.0 - 34.0 (pg)   MCHC 35.2  30.0 - 36.0 (g/dL)   RDW 57.8  46.9 - 62.9 (%)   Platelets 207  150 - 400 (K/uL)  DIFFERENTIAL  Component Value Range   Neutrophils Relative 48  43 - 77 (%)   Neutro Abs 3.1  1.7 - 7.7 (K/uL)   Lymphocytes Relative 38  12 - 46 (%)   Lymphs Abs 2.4  0.7 -  4.0 (K/uL)   Monocytes Relative 10  3 - 12 (%)   Monocytes Absolute 0.7  0.1 - 1.0 (K/uL)   Eosinophils Relative 4  0 - 5 (%)   Eosinophils Absolute 0.2  0.0 - 0.7 (K/uL)   Basophils Relative 0  0 - 1 (%)   Basophils Absolute 0.0  0.0 - 0.1 (K/uL)   No results found.    Amr Sturtevant A. Patrica Duel, MD 09/19/11 1243  12:52 PM   Patient's elevated troponin as noted. Discussion with the physician from the lower cardiology, who will see the patient here in bed. 5. The charge nurse, was also notified and an attempt to move the patient to a different bed. Working in that situation at this time. Await cardiology consultation  Leanthony Rhett A. Patrica Duel, MD 09/19/11 1252

## 2011-09-19 NOTE — H&P (Signed)
Reason for admit: Acute on chronic HF  HPI:  Douglas [name]  is a 35 y.o. male with a h/o HTN, HL, obesity, CHF due to NICM (EF 25-30% by echo august 2012) and medication non-compliance  Underwent cath in 2006 which showed normal cors with EF 20-25%. Saw me in 2009 but was then lost to f/u. Just out of prison Feb 2012 (for 1 year)   We began seeing him again in the HF clinic over the summer. He has had persistent NYHA III symptoms. Echo with EF 25-30%.  CPX test (full details unavailable) had pVO2 20. Meds restarted and he reports compliance.  Over past week much more SOB and profound DOE with NYHA IIIb-IV symptoms. + abdominal bloating, LE edema and orthopnea. Has been doubling up on lasix without much benefit. Weight up about 6-7 pounds but feels it may be back down a bit now. + non-productive cough. No CP.   Came to ER today. CXR with pulm edema. Trop 0.63. BNP pending/  Smoking one-two cigarettes a day. Denies ETOH or drug use.   ROS: All other systems normal except as mentioned in HPI, past medical history and problem list.    Past Medical History  Diagnosis Date  . Hypertension   . Hyperlipidemia   . Systolic CHF, chronic   . Obesity   . Nonischemic cardiomyopathy     a.  echo 4/06: EF 30%, mild to mod MR, mild RAE, inf HK, lat HK , ant HK;    b.  cath 4/06: no CAD, EF 20-25%  . ED (erectile dysfunction)   . NSVT (nonsustained ventricular tachycardia)     eval by EP in past; no ICD candidate due to NYHA 1 symptoms  . CHF (congestive heart failure)     Medications Prior to Admission  Medication Dose Route Frequency Provider Last Rate Last Dose  . aspirin chewable tablet 324 mg  324 mg Oral Once Peter A. Tucich, MD   324 mg at 09/19/11 1148  . furosemide (LASIX) injection 40 mg  40 mg Intravenous Once Peter A. Tucich, MD      . nitroGLYCERIN (NITROSTAT) SL tablet 0.4 mg  0.4 mg Sublingual Q5 Min x 3 PRN Peter A. Patrica Duel, MD       Medications Prior to Admission  Medication Sig  Dispense Refill  . aspirin 81 MG tablet Take 81 mg by mouth daily.        . carvedilol (COREG) 6.25 MG tablet Take 1 tablet (6.25 mg total) by mouth 2 (two) times daily with a meal.  60 tablet  6  . furosemide (LASIX) 40 MG tablet Take 1 tablet (40 mg total) by mouth 2 (two) times daily.  60 tablet  6  . lisinopril (PRINIVIL,ZESTRIL) 10 MG tablet Take 1 tablet (10 mg total) by mouth daily.  30 tablet  6  . spironolactone (ALDACTONE) 25 MG tablet Take 1 tablet (25 mg total) by mouth daily.  30 tablet  6  . DISCONTD: furosemide (LASIX) 40 MG tablet Take 1 tablet (40 mg total) by mouth daily.  30 tablet  11     Allergies  Allergen Reactions  . Penicillins Other (See Comments)    Unknown     History   Social History  . Marital Status: Legally Separated    Spouse Name: N/A    Number of Children: 5  . Years of Education: N/A   Occupational History  . unemployed    Social History Main Topics  . Smoking  status: Current Some Day Smoker -- 0.3 packs/day    Types: Cigarettes  . Smokeless tobacco: Former Neurosurgeon    Quit date: 06/20/2011  . Alcohol Use: 0.5 oz/week    1 drink(s) per week  . Drug Use: No  . Sexually Active: Not on file   Other Topics Concern  . Not on file   Social History Narrative  . No narrative on file    Family History  Problem Relation Age of Onset  . Coronary artery disease Mother 81    s/p PCI  . Lung cancer Father   . Diabetes type II Maternal Uncle   . Coronary artery disease Maternal Uncle     PHYSICAL EXAM: Filed Vitals:   09/19/11 1010  BP: 132/95  Pulse: 50  Temp: 97.6 F (36.4 C)  Resp: 18   General:  Well appearing. No respiratory difficulty HEENT: normal Neck: supple. no JVP 6. Carotids 2+ bilat; no bruits. No lymphadenopathy or thryomegaly appreciated. Cor: PMI laterally displaced. Regular rate & rhythm. +s3 Lungs: clear with minimal basilar crackles. Abdomen: soft, nontender, nondistended. No hepatosplenomegaly. No bruits or  masses. Good bowel sounds. Extremities: no cyanosis, clubbing, rash. Tr - 1+ edema Neuro: alert & oriented x 3, cranial nerves grossly intact. moves all 4 extremities w/o difficulty. Affect pleasant.  ECG: NSR 92. LVH/LAE. Prolonged QT (QTc ) No ST-T wave abnormalities.    Results for orders placed during the hospital encounter of 09/19/11 (from the past 24 hour(s))  CARDIAC PANEL(CRET KIN+CKTOT+MB+TROPI)     Status: Abnormal   Collection Time   09/19/11 11:35 AM      Component Value Range   Total CK 283 (*) 7 - 232 (U/L)   CK, MB 4.3 (*) 0.3 - 4.0 (ng/mL)   Troponin I 0.63 (*) <0.30 (ng/mL)   Relative Index 1.5  0.0 - 2.5   BASIC METABOLIC PANEL     Status: Abnormal   Collection Time   09/19/11 11:36 AM      Component Value Range   Sodium 139  135 - 145 (mEq/L)   Potassium 3.6  3.5 - 5.1 (mEq/L)   Chloride 105  96 - 112 (mEq/L)   CO2 23  19 - 32 (mEq/L)   Glucose, Bld 113 (*) 70 - 99 (mg/dL)   BUN 15  6 - 23 (mg/dL)   Creatinine, Ser 4.69  0.50 - 1.35 (mg/dL)   Calcium 9.1  8.4 - 62.9 (mg/dL)   GFR calc non Af Amer 80 (*) >90 (mL/min)   GFR calc Af Amer >90  >90 (mL/min)  CBC     Status: Normal   Collection Time   09/19/11 11:36 AM      Component Value Range   WBC 6.4  4.0 - 10.5 (K/uL)   RBC 4.70  4.22 - 5.81 (MIL/uL)   Hemoglobin 14.3  13.0 - 17.0 (g/dL)   HCT 52.8  41.3 - 24.4 (%)   MCV 86.4  78.0 - 100.0 (fL)   MCH 30.4  26.0 - 34.0 (pg)   MCHC 35.2  30.0 - 36.0 (g/dL)   RDW 01.0  27.2 - 53.6 (%)   Platelets 207  150 - 400 (K/uL)  DIFFERENTIAL     Status: Normal   Collection Time   09/19/11 11:36 AM      Component Value Range   Neutrophils Relative 48  43 - 77 (%)   Neutro Abs 3.1  1.7 - 7.7 (K/uL)   Lymphocytes Relative 38  12 -  46 (%)   Lymphs Abs 2.4  0.7 - 4.0 (K/uL)   Monocytes Relative 10  3 - 12 (%)   Monocytes Absolute 0.7  0.1 - 1.0 (K/uL)   Eosinophils Relative 4  0 - 5 (%)   Eosinophils Absolute 0.2  0.0 - 0.7 (K/uL)   Basophils Relative 0  0 -  1 (%)   Basophils Absolute 0.0  0.0 - 0.1 (K/uL)   Dg Chest 2 View  09/19/2011  *RADIOLOGY REPORT*  Clinical Data: Shortness of breath.  Abdominal swelling.  CHEST - 2 VIEW  Comparison: Radiographs and CT 06/17/2011.  Findings: There is stable cardiomegaly with chronic vascular congestion and mild interstitial edema.  No confluent airspace opacity or pleural effusion is present.  The osseous structures appear unchanged.  IMPRESSION: Stable cardiomegaly and chronic interstitial pulmonary edema.  Original Report Authenticated By: Gerrianne Scale, M.D.     ASSESSMENT:  1) Acute on chronic systolic HF      --NICM EF 25-30% 2) Tobacco use, ongoing 3) h/o HTN, controlled 4) Non-obstructive CAD by cath 2006 5) h/o medication non-compliance 6) NSVT - 37 beats in ER  PLAN/DISCUSSION:   Dyspnea and fatigue seem far out of proportion to degree of volume overload thus I am concerned about low-output state. Will admit for diuresis with IV lasix. Continue home meds and add digoxin. Plan R heart cath tomorrow to evaluate cardiac and potential need for inotropic support. Given non-obs CAD on previous cath will also evaluate coronaries for progressive CAD as contributor to his symptoms. Counseled on need for smoking cessation.

## 2011-09-19 NOTE — ED Notes (Signed)
In and Out catheter procedure not performed, accepted accidentally, procedure not necessary , Pt provided urine

## 2011-09-19 NOTE — ED Notes (Signed)
Pt having non productive cough x 1 week, hx of chf, also having abd pain. Swelling to legs and abd. No resp distress noted at triage.

## 2011-09-19 NOTE — ED Notes (Signed)
Pt meal tray was ordered

## 2011-09-19 NOTE — ED Notes (Signed)
Asking for meal tray. No c/o pain or shob.

## 2011-09-19 NOTE — ED Notes (Signed)
Dr Patrica Duel notified of pos Troponin

## 2011-09-19 NOTE — ED Notes (Signed)
Patient is resting comfortably. 

## 2011-09-20 ENCOUNTER — Inpatient Hospital Stay (HOSPITAL_COMMUNITY): Payer: Self-pay

## 2011-09-20 ENCOUNTER — Encounter (HOSPITAL_COMMUNITY)
Admission: EM | Disposition: A | Discharge status: Home / Self Care | Payer: Self-pay | Source: Home / Self Care | Attending: Internal Medicine

## 2011-09-20 ENCOUNTER — Encounter (HOSPITAL_COMMUNITY): Payer: Self-pay | Admitting: Dietician

## 2011-09-20 DIAGNOSIS — I509 Heart failure, unspecified: Secondary | ICD-10-CM

## 2011-09-20 DIAGNOSIS — Z720 Tobacco use: Secondary | ICD-10-CM | POA: Insufficient documentation

## 2011-09-20 HISTORY — PX: LEFT AND RIGHT HEART CATHETERIZATION WITH CORONARY ANGIOGRAM: SHX5449

## 2011-09-20 LAB — POCT I-STAT 3, VENOUS BLOOD GAS (G3P V)
Acid-base deficit: 1 mmol/L (ref 0.0–2.0)
Acid-base deficit: 5 mmol/L — ABNORMAL HIGH (ref 0.0–2.0)
Bicarbonate: 19.9 mEq/L — ABNORMAL LOW (ref 20.0–24.0)
Bicarbonate: 22.2 mEq/L (ref 20.0–24.0)
TCO2: 21 mmol/L (ref 0–100)
TCO2: 23 mmol/L (ref 0–100)
pH, Ven: 7.372 — ABNORMAL HIGH (ref 7.250–7.300)
pO2, Ven: 28 mmHg — CL (ref 30.0–45.0)

## 2011-09-20 LAB — BASIC METABOLIC PANEL
BUN: 17 mg/dL (ref 6–23)
Calcium: 9.1 mg/dL (ref 8.4–10.5)
Chloride: 107 mEq/L (ref 96–112)
Creatinine, Ser: 1.21 mg/dL (ref 0.50–1.35)
GFR calc Af Amer: 88 mL/min — ABNORMAL LOW (ref >90)

## 2011-09-20 LAB — POCT I-STAT 3, ART BLOOD GAS (G3+)
TCO2: 22 mmol/L (ref 0–100)
pCO2 arterial: 31.9 mmHg — ABNORMAL LOW (ref 35.0–45.0)
pH, Arterial: 7.433 (ref 7.350–7.450)

## 2011-09-20 LAB — POCT ACTIVATED CLOTTING TIME: Activated Clotting Time: 116 seconds

## 2011-09-20 LAB — PROTIME-INR: Prothrombin Time: 16.2 seconds — ABNORMAL HIGH (ref 11.6–15.2)

## 2011-09-20 SURGERY — LEFT AND RIGHT HEART CATHETERIZATION WITH CORONARY ANGIOGRAM
Anesthesia: LOCAL

## 2011-09-20 MED ORDER — MILRINONE IN DEXTROSE 200-5 MCG/ML-% IV SOLN
0.1250 ug/kg/min | INTRAVENOUS | Status: DC
  Administered 2011-09-20 – 2011-09-22 (×5): 0.25 ug/kg/min via INTRAVENOUS
  Filled 2011-09-20 (×13): qty 100

## 2011-09-20 MED ORDER — MIDAZOLAM HCL 2 MG/2ML IJ SOLN
INTRAMUSCULAR | Status: AC
  Filled 2011-09-20: qty 2

## 2011-09-20 MED ORDER — FUROSEMIDE 10 MG/ML IJ SOLN
INTRAMUSCULAR | Status: AC
  Administered 2011-09-21: 80 mg via INTRAVENOUS
  Filled 2011-09-20: qty 8

## 2011-09-20 MED ORDER — FENTANYL CITRATE 0.05 MG/ML IJ SOLN
INTRAMUSCULAR | Status: AC
  Filled 2011-09-20: qty 2

## 2011-09-20 MED ORDER — HEPARIN (PORCINE) IN NACL 2-0.9 UNIT/ML-% IJ SOLN
INTRAMUSCULAR | Status: AC
  Filled 2011-09-20: qty 2000

## 2011-09-20 MED ORDER — NITROGLYCERIN 0.2 MG/ML ON CALL CATH LAB
INTRAVENOUS | Status: AC
  Filled 2011-09-20: qty 1

## 2011-09-20 MED ORDER — SODIUM CHLORIDE 0.9 % IV SOLN: 250.0000 mL | INTRAVENOUS | Status: DC

## 2011-09-20 MED ORDER — POTASSIUM CHLORIDE CRYS ER 20 MEQ PO TBCR
40.0000 meq | EXTENDED_RELEASE_TABLET | Freq: Once | ORAL | Status: AC
  Administered 2011-09-20: 40 meq via ORAL

## 2011-09-20 MED ORDER — ASPIRIN 81 MG PO CHEW
324.0000 mg | CHEWABLE_TABLET | Freq: Once | ORAL | Status: AC
  Administered 2011-09-20: 324 mg via ORAL
  Filled 2011-09-20: qty 4

## 2011-09-20 NOTE — Op Note (Signed)
Cardiac Cath Procedure Note:  Indication: HF/dyspnea  Procedures performed:  1) Right heart cathererization 2) Selective coronary angiography 3) Left heart catheterization 4) Left ventriculogram  Description of procedure:   The risks and indication of the procedure were explained. Consent was signed and placed on the chart. An appropriate timeout was taken prior to the procedure. The right groin was prepped and draped in the routine sterile fashion and anesthetized with 1% local lidocaine.   A 5 FR arterial sheath was placed in the right femoral artery using a modified Seldinger technique. Standard catheters including a JL4, JR4 and angled pigtail were used. All catheter exchanges were made over a wire. A 7 FR venous sheath was placed in the right femoral vein using a modified Seldinger technique. A standard Swan-Ganz catheter was used for the procedure.  We made multiple attempts to obtain thermodilution readings but could not get an accurate tracing. We then used a metabolic cart to directly measure O2 consumption and the fick cardiac output was recalculated.   Complications:  None apparent  Findings:  RA = 17 RV = 66/14/20 PA =  73/34 (51) PCW = mean 32  V = 40-45 Fick cardiac output/index = 5.0/1.9 (using calculated O2 consumption) Fick cardiac output/index = 5.0/1.9 (using measured O2 consumption) PVR = 3.80 wwods SVR = 1487 FA sat = 90% PA sat = 53%,55% SVC sat = 54%   Ao Pressure: 123/99 (110) LV Pressure:   109/17/28 There was no signficant gradient across the aortic valve on pullback.  Left main: Short. Mild plaquing  LAD: Mild to moderate diffuse plaquing without high grade stenosis  LCX: Large vessel giving off large OM1. Mild diffuse plaquing  RCA: Dominant vessel. 30% prox. Otherwise mild plaquing.  LV-gram done in the RAO projection: Markedly dilated with global hypokinesis. EF = 5-10%   Assessment: 1. Mild non-obstructive CAD 2. Severe LV dysfunction  due to NICM 3. Markedly elevated filling pressures with mildly reduced cardiac output. 4. Mildly increased PVR 5. Ventricular tachycardia   Plan/Discussion: Will transfer to stepdown. Start milrinone to assist diuresis. Place PICC for monitoring. Titrate afterload reduction. Will d/w EP re: ICD. May need advanced therapies.    [name], MD 8:52 AM

## 2011-09-20 NOTE — Interval H&P Note (Signed)
History and Physical Interval Note:   09/20/2011   8:50 AM   Douglas [name]  has presented today for surgery, with the diagnosis of Chest pain  The various methods of treatment have been discussed with the patient and family. After consideration of risks, benefits and other options for treatment, the patient has consented to  Procedure(s): LEFT AND RIGHT HEART CATHETERIZATION WITH CORONARY ANGIOGRAM as a surgical intervention .  The patients' history has been reviewed, patient examined, no change in status, stable for surgery.  I have reviewed the patients' chart and labs.  Questions were answered to the patient's satisfaction.     [name]  MD  H and P reviewed no changes since admission.  Patient seen and examined

## 2011-09-20 NOTE — Progress Notes (Signed)
Subjective:   Remains fatigued and tachycardic. Just minimal urine output overnight. Denies orthopnea, PND or lower extremity edema. Had asymptomatic 37 beat run of nonsustained ventricular tachycardia last night. We'll plan right and left heart catheter today.   Intake/Output Summary (Last 24 hours) at 09/20/11 0739 Last data filed at 09/20/11 0444  Gross per 24 hour  Intake    480 ml  Output    200 ml  Net    280 ml      Current meds:    . aspirin  324 mg Oral Once  . aspirin  324 mg Oral Once  . aspirin  81 mg Oral Daily  . carvedilol  6.25 mg Oral BID WC  . digoxin  0.25 mg Oral Daily  . furosemide  40 mg Intravenous Once  . furosemide  40 mg Intramuscular Once  . furosemide  80 mg Intravenous BID  . heparin  5,000 Units Subcutaneous Q8H  . lisinopril  10 mg Oral Daily  . magnesium sulfate IVPB  2 g Intravenous Once  . magnesium sulfate IVPB  2 g Intravenous Once  . potassium chloride  20 mEq Oral BID  . sodium chloride  3 mL Intravenous Q12H  . spironolactone  25 mg Oral Daily  . DISCONTD: magnesium sulfate  2 g Intravenous Once  . DISCONTD: magnesium sulfate IVPB  2 g Intravenous Once   Infusions:    . sodium chloride       Objective:  Blood pressure 121/81, pulse 101, temperature 98 F (36.7 C), temperature source Oral, resp. rate 25, height 6\' 4"  (1.93 m), weight 127.5 kg (281 lb 1.4 oz), SpO2 95.00%. Weight change:   Physical Exam: General:  Well appearing. No resp difficulty HEENT: normal Neck: supple. JVP 6. Carotids 2+ bilat; no bruits. No lymphadenopathy or thryomegaly appreciated. Cor: PMI nondisplaced. Regular rate & rhythm. No rubs, gallops or murmurs. Lungs: clear Abdomen: soft, nontender, nondistended. No hepatosplenomegaly. No bruits or masses. Good bowel sounds. Extremities: no cyanosis, clubbing, rash, edema Neuro: alert & orientedx3, cranial nerves grossly intact. moves all 4 extremities w/o difficulty. Affect pleasant  Telemetry:  Sinus  tachycardia 110 to 115. 37 beat run of nonsustained ventricular tachycardia overnight  Lab Results:

## 2011-09-21 LAB — BASIC METABOLIC PANEL
Calcium: 9.2 mg/dL (ref 8.4–10.5)
GFR calc Af Amer: 80 mL/min — ABNORMAL LOW (ref >90)
GFR calc non Af Amer: 69 mL/min — ABNORMAL LOW (ref >90)
Sodium: 138 mEq/L (ref 135–145)

## 2011-09-21 MED ORDER — LISINOPRIL 10 MG PO TABS
10.0000 mg | ORAL_TABLET | Freq: Two times a day (BID) | ORAL | Status: DC
  Administered 2011-09-21: 10 mg via ORAL
  Filled 2011-09-21 (×3): qty 1

## 2011-09-21 MED ORDER — POTASSIUM CHLORIDE CRYS ER 20 MEQ PO TBCR
40.0000 meq | EXTENDED_RELEASE_TABLET | Freq: Once | ORAL | Status: AC
  Administered 2011-09-21: 40 meq via ORAL
  Filled 2011-09-21: qty 1

## 2011-09-21 MED ORDER — FUROSEMIDE 40 MG PO TABS
40.0000 mg | ORAL_TABLET | Freq: Every day | ORAL | Status: DC
  Administered 2011-09-22 – 2011-09-23 (×2): 40 mg via ORAL
  Filled 2011-09-21 (×3): qty 1

## 2011-09-21 MED ORDER — SODIUM CHLORIDE 0.9 % IV SOLN: 250.0000 mL | INTRAVENOUS | Status: DC

## 2011-09-21 NOTE — Progress Notes (Signed)
Utilization Review Complete.  Douglas [name] 09/21/2011. 

## 2011-09-21 NOTE — Progress Notes (Signed)
Subjective:   Massive diuresis last night on milrinone. Feels better. Breathing improved. Denies orthopnea, PND or lower extremity edema. Denies CP. 36 beats NSVT. Cath as below.  Lives with his mom but she works Monday - Friday. He does have support from his sister. Transportation to appointment outside of [place] will be difficult. He is going to start the disability process.   11/5 Cath: Mild non-obstructive CAD. RHC  RA = 17 RV = 66/14/20 PA = 73/34 (51) PCW = mean 32 V = 40-45 Fick cardiac output/index = 5.0/1.9 (using calculated O2 consumption) Fick cardiac output/index = 5.0/1.9 (using measured O2 consumption) PVR = 3.80 wwods SVR = 1487 FA sat = 90% PA sat = 53%,55% SVC sat = 54%   Intake/Output Summary (Last 24 hours) at 09/21/11 0812 Last data filed at 09/21/11 0340  Gross per 24 hour  Intake  85.76 ml  Output   5075 ml  Net -4989.24 ml    Current meds:    . aspirin  81 mg Oral Daily  . carvedilol  6.25 mg Oral BID WC  . digoxin  0.25 mg Oral Daily  . fentaNYL      . furosemide      . furosemide  40 mg Intravenous Once  . furosemide  40 mg Intramuscular Once  . furosemide  80 mg Intravenous BID  . heparin      . heparin  5,000 Units Subcutaneous Q8H  . lisinopril  10 mg Oral Daily  . midazolam      . nitroGLYCERIN      . potassium chloride  20 mEq Oral BID  . potassium chloride  40 mEq Oral Once  . sodium chloride  3 mL Intravenous Q12H  . spironolactone  25 mg Oral Daily   Infusions:    . sodium chloride    . sodium chloride    . milrinone 0.25 mcg/kg/min (09/21/11 0340)  . DISCONTD: sodium chloride      Objective:  Blood pressure 105/63, pulse 63, temperature 98.1 F (36.7 C), temperature source Oral, resp. rate 18, height 6\' 4"  (1.93 m), weight 122.3 kg (269 lb 10 oz), SpO2 98.00%. Weight change: -8.507 kg (-18 lb 12.1 oz)  Physical Exam: General:  Well appearing. No resp difficulty HEENT: normal Neck: supple. JVP 6. Carotids 2+ bilat; no bruits.  No lymphadenopathy or thryomegaly appreciated. Cor: PMI nondisplaced. Regular rate & rhythm. No rubs, gallops or murmurs. Lungs: LLL crackles Abdomen: soft, nontender, nondistended. No hepatosplenomegaly. No bruits or masses. Good bowel sounds. Extremities: no cyanosis, clubbing, rash, edema Neuro: alert & orientedx3, cranial nerves grossly intact. moves all 4 extremities w/o difficulty. Affect pleasant  Telemetry:  Sinus rhythm 93-95. 36 beat run of nonsustained ventricular tachycardia this am  Lab Results:  Results for orders placed during the hospital encounter of 09/19/11 (from the past 24 hour(s))  POCT I-STAT 3, BLOOD GAS (G3+)     Status: Abnormal   Collection Time   09/20/11  9:24 AM      Component Value Range   pH, Arterial 7.433  7.350 - 7.450    pCO2 arterial 31.9 (*) 35.0 - 45.0 (mmHg)   pO2, Arterial 54.0 (*) 80.0 - 100.0 (mmHg)   Bicarbonate 21.3  20.0 - 24.0 (mEq/L)   TCO2 22  0 - 100 (mmol/L)   O2 Saturation 89.0     Acid-base deficit 2.0  0.0 - 2.0 (mmol/L)   Sample type ARTERIAL    POCT I-STAT 3, BLOOD GAS (G3P V)  Status: Abnormal   Collection Time   09/20/11  9:30 AM      Component Value Range   pH, Ven 7.372 (*) 7.250 - 7.300    pCO2, Ven 38.2 (*) 45.0 - 50.0 (mmHg)   pO2, Ven 29.0 (*) 30.0 - 45.0 (mmHg)   Bicarbonate 22.2  20.0 - 24.0 (mEq/L)   TCO2 23  0 - 100 (mmol/L)   O2 Saturation 55.0     Acid-base deficit 3.0 (*) 0.0 - 2.0 (mmol/L)   Sample type VENOUS    POCT I-STAT 3, BLOOD GAS (G3P V)     Status: Abnormal   Collection Time   09/20/11  9:30 AM      Component Value Range   pH, Ven 7.353 (*) 7.250 - 7.300    pCO2, Ven 35.9 (*) 45.0 - 50.0 (mmHg)   pO2, Ven 29.0 (*) 30.0 - 45.0 (mmHg)   Bicarbonate 19.9 (*) 20.0 - 24.0 (mEq/L)   TCO2 21  0 - 100 (mmol/L)   O2 Saturation 53.0     Acid-base deficit 5.0 (*) 0.0 - 2.0 (mmol/L)   Sample type VENOUS    POCT I-STAT 3, BLOOD GAS (G3P V)     Status: Abnormal   Collection Time   09/20/11 10:03 AM       Component Value Range   pH, Ven 7.404 (*) 7.250 - 7.300    pCO2, Ven 37.4 (*) 45.0 - 50.0 (mmHg)   pO2, Ven 28.0 (*) 30.0 - 45.0 (mmHg)   Bicarbonate 23.4  20.0 - 24.0 (mEq/L)   TCO2 24  0 - 100 (mmol/L)   O2 Saturation 54.0     Acid-base deficit 1.0  0.0 - 2.0 (mmol/L)   Sample type VENOUS     Comment NOTIFIED PHYSICIAN    POCT ACTIVATED CLOTTING TIME     Status: Normal   Collection Time   09/20/11 10:19 AM      Component Value Range   Activated Clotting Time 116      ASSESSMENT:  1) Acute on chronic systolic HF  --NICM EF 5-10%  2) Tobacco use, ongoing  3) h/o HTN, controlled  4) Non-obstructive CAD by cath 2006  5) h/o medication non-compliance  6) NSVT - 37 beats in ER   PLAN/DISCUSSION:  Dyspnea  Improved. Volume status improved. Will continue milrinone today. Will back off lasix as likely nearing euvolemia. Titrate lisinopril. Will ask EP to see for ICD.  Suspect he will need advanced therapies soon. Long discussion about high-risk prognosis and probable need for advanced therapies over the next year. We discussed social habits, family support, need for compliance. He will need to apply for Medicare/Medicaid. Will try to wean milrinone over next few days.   Total time spent 1 hour.   Patient seen and examined with Tonye Becket, NP. We discussed all aspects of the encounter. I agree with the assessment and plan as stated above.     LOS: 2 day   Amy Clegg NP-C  [name]

## 2011-09-21 NOTE — Progress Notes (Signed)
Clinical Social Worker completed the psychosocial assessment which can be found in the shadow chart.  

## 2011-09-22 ENCOUNTER — Other Ambulatory Visit (HOSPITAL_COMMUNITY): Payer: Self-pay | Admitting: Adult Health

## 2011-09-22 ENCOUNTER — Encounter (HOSPITAL_COMMUNITY)
Admission: EM | Disposition: A | Discharge status: Home / Self Care | Payer: Self-pay | Source: Home / Self Care | Attending: Internal Medicine

## 2011-09-22 ENCOUNTER — Encounter (HOSPITAL_COMMUNITY): Payer: Self-pay | Admitting: Internal Medicine

## 2011-09-22 DIAGNOSIS — I472 Ventricular tachycardia: Secondary | ICD-10-CM

## 2011-09-22 DIAGNOSIS — I428 Other cardiomyopathies: Secondary | ICD-10-CM

## 2011-09-22 LAB — BASIC METABOLIC PANEL
BUN: 20 mg/dL (ref 6–23)
CO2: 25 mEq/L (ref 19–32)
Chloride: 102 mEq/L (ref 96–112)
Chloride: 103 mEq/L (ref 96–112)
GFR calc Af Amer: 85 mL/min — ABNORMAL LOW (ref >90)
GFR calc non Af Amer: 75 mL/min — ABNORMAL LOW (ref >90)
Glucose, Bld: 96 mg/dL (ref 70–99)
Potassium: 4 mEq/L (ref 3.5–5.1)
Potassium: 3.7 mEq/L (ref 3.5–5.1)
Sodium: 138 mEq/L (ref 135–145)

## 2011-09-22 SURGERY — ECHOCARDIOGRAM, TRANSESOPHAGEAL
Anesthesia: Choice | Laterality: Left

## 2011-09-22 MED ORDER — SODIUM CHLORIDE 0.9 % IV SOLN: 250.0000 mL | INTRAVENOUS | Status: DC

## 2011-09-22 MED ORDER — INFLUENZA VIRUS VACC SPLIT PF IM SUSP
0.5000 mL | INTRAMUSCULAR | Status: AC
  Administered 2011-09-23: 0.5 mL via INTRAMUSCULAR
  Filled 2011-09-22: qty 0.5

## 2011-09-22 MED ORDER — SODIUM CHLORIDE 0.9 % IJ SOLN: 3.0000 mL | INTRAMUSCULAR | Status: DC | PRN

## 2011-09-22 MED ORDER — CARVEDILOL 6.25 MG PO TABS
9.3750 mg | ORAL_TABLET | Freq: Two times a day (BID) | ORAL | Status: DC
  Administered 2011-09-22 – 2011-09-23 (×4): 9.375 mg via ORAL
  Filled 2011-09-22 (×7): qty 1

## 2011-09-22 MED ORDER — PNEUMOCOCCAL VAC POLYVALENT 25 MCG/0.5ML IJ INJ
0.5000 mL | INJECTION | INTRAMUSCULAR | Status: AC
  Administered 2011-09-23: 0.5 mL via INTRAMUSCULAR
  Filled 2011-09-22: qty 0.5

## 2011-09-22 MED ORDER — SODIUM CHLORIDE 0.9 % IJ SOLN
3.0000 mL | Freq: Two times a day (BID) | INTRAMUSCULAR | Status: DC
  Administered 2011-09-22: 3 mL via INTRAVENOUS

## 2011-09-22 MED ORDER — LISINOPRIL 5 MG PO TABS
15.0000 mg | ORAL_TABLET | Freq: Two times a day (BID) | ORAL | Status: DC
  Administered 2011-09-22 (×2): 15 mg via ORAL
  Filled 2011-09-22 (×4): qty 1

## 2011-09-22 MED ORDER — MILRINONE IN DEXTROSE 200-5 MCG/ML-% IV SOLN
0.1250 ug/kg/min | INTRAVENOUS | Status: DC
  Administered 2011-09-22: 0.125 ug/kg/min via INTRAVENOUS
  Filled 2011-09-22 (×2): qty 100

## 2011-09-22 NOTE — Progress Notes (Signed)
Subjective:   Resting comfortably currently this morning. Long family discussion yesterday with the patient his mother and 3 siblings. There are very motivated for him to get better and would consider advance therapies as needed. That said they're hopeful he can recover with medical therapy and a dedicated to help him be as compliant as possible.  Remains on milrinone. IV Lasix which to by mouth this morning. Continues with good diuresis. Denies chest pain, shortness of breath, orthopnea, PND. Renal function is stable.   11/5 Cath: Mild non-obstructive CAD. RHC  RA = 17 RV = 66/14/20 PA = 73/34 (51) PCW = mean 32 V = 40-45 Fick cardiac output/index = 5.0/1.9 (using calculated O2 consumption) Fick cardiac output/index = 5.0/1.9 (using measured O2 consumption) PVR = 3.80 wwods SVR = 1487 FA sat = 90% PA sat = 53%,55% SVC sat = 54%   Intake/Output Summary (Last 24 hours) at 09/22/11 0747 Last data filed at 09/22/11 0700  Gross per 24 hour  Intake 1776.15 ml  Output   4100 ml  Net -2323.85 ml    Current meds:    . aspirin  81 mg Oral Daily  . carvedilol  6.25 mg Oral BID WC  . digoxin  0.25 mg Oral Daily  . furosemide  40 mg Oral Daily  . heparin  5,000 Units Subcutaneous Q8H  . lisinopril  10 mg Oral BID  . potassium chloride  20 mEq Oral BID  . potassium chloride  40 mEq Oral Once  . sodium chloride  3 mL Intravenous Q12H  . spironolactone  25 mg Oral Daily  . DISCONTD: furosemide  40 mg Intravenous Once  . DISCONTD: furosemide  40 mg Intramuscular Once  . DISCONTD: furosemide  80 mg Intravenous BID  . DISCONTD: lisinopril  10 mg Oral Daily   Infusions:    . sodium chloride 250 mL (09/22/11 0700)  . milrinone 0.25 mcg/kg/min (09/22/11 0700)  . DISCONTD: sodium chloride    . DISCONTD: sodium chloride 250 mL (09/21/11 0800)    Objective:  Blood pressure 96/68, pulse 85, temperature 98 F (36.7 C), temperature source Oral, resp. rate 16, height 6\' 4"  (1.93 m), weight 121.5  kg (267 lb 13.7 oz), SpO2 98.00%. Weight change: 3 kg (6 lb 9.8 oz) Admit weight: 127.5kg (281 pounds)  Physical Exam: General:  Well appearing. No resp difficulty HEENT: normal Neck: supple. JVP flat. Carotids 2+ bilat; no bruits. No lymphadenopathy or thryomegaly appreciated. Cor: PMI laterally displaced. Regular rate & rhythm. +s3 Lungs: LLL crackles Abdomen: soft, nontender, nondistended. No hepatosplenomegaly. No bruits or masses. Good bowel sounds. Extremities: no cyanosis, clubbing, rash, edema Neuro: alert & orientedx3, cranial nerves grossly intact. moves all 4 extremities w/o difficulty. Affect pleasant  Telemetry:  Sinus rhythm 93-95. 36 beat run of nonsustained ventricular tachycardia   Lab Results:  Results for orders placed during the hospital encounter of 09/19/11 (from the past 24 hour(s))  BASIC METABOLIC PANEL     Status: Abnormal   Collection Time   09/21/11  9:00 AM      Component Value Range   Sodium 138  135 - 145 (mEq/L)   Potassium 3.5  3.5 - 5.1 (mEq/L)   Chloride 101  96 - 112 (mEq/L)   CO2 27  19 - 32 (mEq/L)   Glucose, Bld 138 (*) 70 - 99 (mg/dL)   BUN 16  6 - 23 (mg/dL)   Creatinine, Ser 2.13  0.50 - 1.35 (mg/dL)   Calcium 9.2  8.4 -  10.5 (mg/dL)   GFR calc non Af Amer 69 (*) >90 (mL/min)   GFR calc Af Amer 80 (*) >90 (mL/min)  BASIC METABOLIC PANEL     Status: Abnormal   Collection Time   09/22/11  6:15 AM      Component Value Range   Sodium 138  135 - 145 (mEq/L)   Potassium 4.0  3.5 - 5.1 (mEq/L)   Chloride 102  96 - 112 (mEq/L)   CO2 25  19 - 32 (mEq/L)   Glucose, Bld 96  70 - 99 (mg/dL)   BUN 21  6 - 23 (mg/dL)   Creatinine, Ser 1.19  0.50 - 1.35 (mg/dL)   Calcium 9.5  8.4 - 14.7 (mg/dL)   GFR calc non Af Amer 75 (*) >90 (mL/min)   GFR calc Af Amer 87 (*) >90 (mL/min)    ASSESSMENT:  1) Acute on chronic systolic HF  --NICM EF 5-10%  2) Tobacco use, ongoing  3) h/o HTN, controlled  4) Non-obstructive CAD by cath 2006  5) h/o  medication non-compliance - improved 6) NSVT - 37 beats in ER   PLAN/DISCUSSION:   Continues to improve. IV Lasix was switched to by mouth. Will begin to wean milrinone and continue to increase lisinopril. We will also increase carvedilol slowly. EP to see today for consideration of ICD. Continue discussions regarding advance therapies. Social work working with him to apply for [employer] and Medicaid.   Derin Granquist

## 2011-09-22 NOTE — Consult Note (Signed)
Subjective:  [name] is referred today by Dr. Teressa Lower for evaluation of nonsustained ventricular tachycardia in the setting of a nonischemic cardiomyopathy. The patient is a very pleasant 35 year old man with a long-standing history of LV dysfunction. He has had problems with compliance in the past though he states that this is improved. The patient was diagnosed with a cardiomyopathy in 2009. He has not been on maximal medical therapy at this point. He was in his usual state of health until several days ago when he presented with worsening heart failure symptoms. In the emergency room, he was found to have runs of nonsustained ventricular tachycardia. He has not been symptomatic with this. The patient on telemetry he has had recurrent nonsustained VT. He denies palpitations. He states the only time he feels his heart racing as we gets up in the middle the night to use the bathroom. When he is well compensated, his heart failure is class II. He has had some dietary compliance issues with sodium in the past.  Family history - the family history is negative for sudden cardiac death. His father had lung cancer. His mother had coronary disease.  Social history - the patient has a history of both tobacco and alcohol abuse. He was smoking until he came into the hospital. He states that he is no longer going to smoke. He knows that he drinks approximately 1 alcoholic beverage a week.  Review of systems - all systems were reviewed and negative except as noted in the history of present illness. He does have occasional orthopnea and PND. He also has peripheral edema. Objective:  Vital Signs in the last 24 hours: Temp:  [97.2 F (36.2 C)-98 F (36.7 C)] 97.2 F (36.2 C) (11/07 1209) Pulse Rate:  [72-85] 72  (11/07 1209) Resp:  [16] 16  (11/07 0400) BP: (96-139)/(28-73) 102/66 mmHg (11/07 1209) SpO2:  [92 %-99 %] 99 % (11/07 1209) Weight:  [267 lb 13.7 oz (121.5 kg)] 267 lb 13.7 oz (121.5 kg) (11/07  0500)  Intake/Output from previous day: 11/06 0701 - 11/07 0700 In: 1776.2 [P.O.:1320; I.V.:456.2] Out: 4100 [Urine:4100] Intake/Output from this shift: Total I/O In: 169.6 [P.O.:120; I.V.:49.6] Out: -   Physical Exam: Well appearing Young man,  NAD HEENT: Unremarkable Neck:  7 cm  JVD, no thyromegally Lungs:  Clear with no wheezes, rales, or rhonchi.  HEART:  Regular rate rhythm, no murmurs, no rubs, no clicks. A soft S4 gallop is present.  Abd:  Flat, positive bowel sounds, no organomegally, no rebound, no guarding Ext:  2 plus pulses, no edema, no cyanosis, no clubbing Skin:  No rashes no nodules Neuro:  CN II through XII intact, motor grossly intact  Lab Results: No results found for this basename: WBC:2,HGB:2,PLT:2 in the last 72 hours  Basename 09/22/11 1028 09/22/11 0615  NA 139 138  K 3.7 4.0  CL 103 102  CO2 28 25  GLUCOSE 129* 96  BUN 20 21  CREATININE 1.24 1.22     Imaging: 2-D echo has been reviewed.  Cardiac Studies:  Reviewed  Assessment/Plan:  Principal Problem:  *Acute on chronic systolic heart failure - his symptoms are currently class II. His medications were continued to be up titrated as his blood pressure allows.  Active Problems:  Ventricular tachycardia - the patient's tachycardia is asymptomatic. I'm certain that he has had ventricular tachycardia for a long time. I would not recommend antiarrhythmic drug therapy at this time. Rather up titration of his beta blocker and afterload  reduction is recommended. The patient would be a candidate for a life vest to be used for prevention of malignant ventricular arrhythmias. Once we have documented compliance and up titration of his medications are complete, I would recommend repeat 2-D echocardiography. If his left ventricular dysfunction remains, and his congestive heart failure is at least class II, that he would be a candidate for prophylactic ICD implantation for primary prevention of sudden cardiac  death. If the patient develops syncope or sustained ventricular tachycardia, then he would be a candidate for ICD implantation for secondary prevention.     LOS: 3 days    Lewayne Bunting 09/22/2011, 1:00 PM

## 2011-09-22 NOTE — Plan of Care (Signed)
Problem: Phase II Progression Outcomes Goal: Walk in hall or up in chair TID Outcome: Completed/Met Date Met:  09/22/11 Pt ambulates in room and walks to and from the bathroom and has been up to the chair several times a day.

## 2011-09-22 NOTE — Progress Notes (Signed)
Dr. Ladona Ridgel signed order for LifeVest.  Order faxed.  Anticipated discharge date Friday per CHF team.  Dennis Bast, RN notified of potential discharge date for fitting. Gypsy Balsam, Charity fundraiser, BSN

## 2011-09-22 NOTE — Consult Note (Signed)
Tobacco cessation- pt smokes 3 cigarettes per day and doesn't even smoke his first one for hours after waking up. He usually smokes after meals. Pt eager to quit. Action stage. Recommended to get up from the table after meals and get in the habit of brushing teeth to change his routine. Also discussed oral fixation substitutes, 2nd hand smoke and in home smoking policy as his brother smokes who is not present today. Referred to 1-800-quit-now for counseling and support. Reviewed and gave pt written education/ f/u / contact information.

## 2011-09-22 NOTE — Progress Notes (Signed)
CSW set pt up with a TAPM eligibility appointment on Nov 18, 2011 (first available)at 1030. Pt then has an appointment with an MD at [facility] on Nov 22, 2011 at 11:15. CSW faxed pt's info to [facility] and they require these appointments to provide services. A case Worker from [employer] for [employer] will be contacting the pt. to discuss how she can help him manage his healthcare.

## 2011-09-22 NOTE — Progress Notes (Signed)
CSW met with pt and explained TAPM appointments in Jan. CSW also informed pt that a case Production designer, theatre/television/film from [employer] for [employer] would be contacting him in the next 7 days to set up an appointment for eligibility. Once eligibility is completed, the pt will then be linked with a case manager that will help him access resources within the community and will also help the pt  with CHF managment.

## 2011-09-23 LAB — BASIC METABOLIC PANEL
CO2: 22 mEq/L (ref 19–32)
Chloride: 103 mEq/L (ref 96–112)
Creatinine, Ser: 1.11 mg/dL (ref 0.50–1.35)

## 2011-09-23 LAB — GLUCOSE, CAPILLARY
Glucose-Capillary: 93 mg/dL (ref 70–99)
Glucose-Capillary: 93 mg/dL (ref 70–99)
Glucose-Capillary: 107 mg/dL — ABNORMAL HIGH (ref 70–99)

## 2011-09-23 MED ORDER — LISINOPRIL 20 MG PO TABS
20.0000 mg | ORAL_TABLET | Freq: Two times a day (BID) | ORAL | Status: DC
  Administered 2011-09-23 – 2011-09-25 (×5): 20 mg via ORAL
  Filled 2011-09-23 (×6): qty 1

## 2011-09-23 NOTE — Progress Notes (Signed)
Subjective:   Feeling better. Denies SOB/CP/dizziness. EP consult completed and recommended Life Vest upon discharge (vs implantable ICD) due to not being on continuous meds for 3 months. Suggested up titration of ACE and Beta Blocker with repeat ECHO at 3 months. Smoking Cessation  Consult completed yesterday. Milrinone reduced yesterday to 0.125 with up titration of ACE and Beta-Blocker.  Lasix changed to po and his weight continues to decrease. Renal function remains stable.    11/5 Cath: Mild non-obstructive CAD. RHC  RA = 17 RV = 66/14/20 PA = 73/34 (51) PCW = mean 32 V = 40-45 Fick cardiac output/index = 5.0/1.9 (using calculated O2 consumption) Fick cardiac output/index = 5.0/1.9 (using measured O2 consumption) PVR = 3.80 wwods SVR = 1487 FA sat = 90% PA sat = 53%,55% SVC sat = 54%   Intake/Output Summary (Last 24 hours) at 09/23/11 0823 Last data filed at 09/23/11 0400  Gross per 24 hour  Intake    900 ml  Output   1625 ml  Net   -725 ml    Current meds:    . aspirin  81 mg Oral Daily  . carvedilol  9.375 mg Oral BID WC  . digoxin  0.25 mg Oral Daily  . furosemide  40 mg Oral Daily  . heparin  5,000 Units Subcutaneous Q8H  . influenza  inactive virus vaccine  0.5 mL Intramuscular Tomorrow-1000  . lisinopril  15 mg Oral BID  . pneumococcal 23 valent vaccine  0.5 mL Intramuscular Tomorrow-1000  . potassium chloride  20 mEq Oral BID  . sodium chloride  3 mL Intravenous Q12H  . sodium chloride  3 mL Intravenous Q12H  . spironolactone  25 mg Oral Daily   Infusions:    . sodium chloride 250 mL (09/23/11 0600)  . milrinone 0.125 mcg/kg/min (09/23/11 0400)  . DISCONTD: sodium chloride    . DISCONTD: milrinone 0.25 mcg/kg/min (09/22/11 0700)    Objective:  Blood pressure 105/66, pulse 73, temperature 97.7 F (36.5 C), temperature source Oral, resp. rate 16, height 6\' 4"  (1.93 m), weight 122.6 kg (270 lb 4.5 oz), SpO2 96.00%. Weight change: 1.1 kg (2 lb 6.8 oz) Admit  weight: 127.5kg (281 pounds)  Physical Exam: General:  Well appearing. No resp difficulty HEENT: normal Neck: supple. JVP flat. Carotids 2+ bilat; no bruits. No lymphadenopathy or thryomegaly appreciated. Cor: PMI laterally displaced. Regular rate & rhythm. +s3 (agree-DB) Lungs: CTA Abdomen: soft, nontender, nondistended. No hepatosplenomegaly. No bruits or masses. Good bowel sounds. Extremities: no cyanosis, clubbing, rash, no edema. Extremities warm Neuro: alert & orientedx3, cranial nerves grossly intact. moves all 4 extremities w/o difficulty. Affect pleasant  Telemetry:  Sinus rhythm 81-83   Lab Results:  Results for orders placed during the hospital encounter of 09/19/11 (from the past 24 hour(s))  BASIC METABOLIC PANEL     Status: Abnormal   Collection Time   09/22/11 10:28 AM      Component Value Range   Sodium 139  135 - 145 (mEq/L)   Potassium 3.7  3.5 - 5.1 (mEq/L)   Chloride 103  96 - 112 (mEq/L)   CO2 28  19 - 32 (mEq/L)   Glucose, Bld 129 (*) 70 - 99 (mg/dL)   BUN 20  6 - 23 (mg/dL)   Creatinine, Ser 1.61  0.50 - 1.35 (mg/dL)   Calcium 9.3  8.4 - 09.6 (mg/dL)   GFR calc non Af Amer 73 (*) >90 (mL/min)   GFR calc Af Amer 85 (*) >  90 (mL/min)  BASIC METABOLIC PANEL     Status: Abnormal   Collection Time   09/23/11  5:35 AM      Component Value Range   Sodium 138  135 - 145 (mEq/L)   Potassium 4.2  3.5 - 5.1 (mEq/L)   Chloride 103  96 - 112 (mEq/L)   CO2 22  19 - 32 (mEq/L)   Glucose, Bld 78  70 - 99 (mg/dL)   BUN 18  6 - 23 (mg/dL)   Creatinine, Ser 1.61  0.50 - 1.35 (mg/dL)   Calcium 9.4  8.4 - 09.6 (mg/dL)   GFR calc non Af Amer 84 (*) >90 (mL/min)   GFR calc Af Amer >90  >90 (mL/min)  GLUCOSE, CAPILLARY     Status: Normal   Collection Time   09/23/11  7:47 AM      Component Value Range   Glucose-Capillary 93  70 - 99 (mg/dL)    ASSESSMENT:  1) Acute on chronic systolic HF      (agree - DB) --NICM EF 5-10%  2) Tobacco use, ongoing  3) h/o HTN,  controlled  4) Non-obstructive CAD by cath 2006  5) h/o medication non-compliance - improved 6) NSVT - 37 beats in ER   PLAN/DISCUSSION:   Continues to improve. Looks euvolemic. Will stop Milrinone in am. Increase Lisinopril to 20 mg BID today. Possible titration of b-blocker in am.  Social Work assistance for disposition and follow-up atTAPM ([facility]) arrranged. Life Vest ordered for discharge. Anticipate discharge Saturday with very close CHF clinic f/u.   Amy Clegg NP-C  Patient seen and examined with Tonye Becket, NP. We discussed all aspects of the encounter. I agree with the assessment and plan as stated above.   [name], MD

## 2011-09-24 ENCOUNTER — Other Ambulatory Visit: Payer: Self-pay | Admitting: Internal Medicine

## 2011-09-24 LAB — PRO B NATRIURETIC PEPTIDE: Pro B Natriuretic peptide (BNP): 490.2 pg/mL — ABNORMAL HIGH (ref 0–125)

## 2011-09-24 LAB — GLUCOSE, CAPILLARY: Glucose-Capillary: 96 mg/dL (ref 70–99)

## 2011-09-24 LAB — BASIC METABOLIC PANEL
Calcium: 9.5 mg/dL (ref 8.4–10.5)
GFR calc Af Amer: >90 mL/min (ref >90)
GFR calc non Af Amer: 80 mL/min — ABNORMAL LOW (ref >90)
Sodium: 136 mEq/L (ref 135–145)

## 2011-09-24 MED ORDER — FUROSEMIDE 40 MG PO TABS
40.0000 mg | ORAL_TABLET | Freq: Every day | ORAL | Status: DC
  Administered 2011-09-24: 40 mg via ORAL
  Filled 2011-09-24: qty 1

## 2011-09-24 MED ORDER — FUROSEMIDE 80 MG PO TABS
80.0000 mg | ORAL_TABLET | Freq: Every day | ORAL | Status: DC
  Administered 2011-09-24 – 2011-09-25 (×2): 80 mg via ORAL
  Filled 2011-09-24 (×2): qty 1

## 2011-09-24 MED ORDER — CARVEDILOL 12.5 MG PO TABS
12.5000 mg | ORAL_TABLET | Freq: Two times a day (BID) | ORAL | Status: DC
  Administered 2011-09-24 – 2011-09-25 (×2): 12.5 mg via ORAL
  Filled 2011-09-24 (×4): qty 1

## 2011-09-24 NOTE — Progress Notes (Signed)
Patient for discharge on Sat 11/10. HF medication assistance program voucher faxed to [facility]. Outpatient pharmacy not open weekends therefore 3 day prescriptions sent to main pharmacy for patient to take home with him. Nurse to pick up from main pharmacy on Sat. Reinforced to patient to make sure he received his 3-day supply prior to leaving hospital. CM also informed nurse.

## 2011-09-24 NOTE — Plan of Care (Addendum)
Problem: Phase I Progression Outcomes Goal: EF % per last Echo/documented,Core Reminder form on chart Outcome: Completed/Met Date Met:  09/24/11 EF= 5-10%

## 2011-09-24 NOTE — Progress Notes (Signed)
Subjective:   Feeling better. Denies SOB/CP/dizziness. Ambulated out of room. Decreased lower extremity edema. Weight slight increase 122 kg but remains significantly below admit weight.  (repeat weight on standing scale.) Continued to diurese over the last 24 hours. Milrinone at 0125 with up titration of ACE. He tolerated increase ACE. BP stable .  Renal function remains stable. (agree -DB)   11/5 Cath: Mild non-obstructive CAD. RHC  RA = 17 RV = 66/14/20 PA = 73/34 (51) PCW = mean 32 V = 40-45 Fick cardiac output/index = 5.0/1.9 (using calculated O2 consumption) Fick cardiac output/index = 5.0/1.9 (using measured O2 consumption) PVR = 3.80 wwods SVR = 1487 FA sat = 90% PA sat = 53%,55% SVC sat = 54%   Intake/Output Summary (Last 24 hours) at 09/24/11 0746 Last data filed at 09/24/11 0600  Gross per 24 hour  Intake 1161.2 ml  Output   1750 ml  Net -588.8 ml    Current meds:    . aspirin  81 mg Oral Daily  . carvedilol  9.375 mg Oral BID WC  . digoxin  0.25 mg Oral Daily  . furosemide  40 mg Oral Daily  . heparin  5,000 Units Subcutaneous Q8H  . influenza  inactive virus vaccine  0.5 mL Intramuscular Tomorrow-1000  . lisinopril  20 mg Oral BID  . pneumococcal 23 valent vaccine  0.5 mL Intramuscular Tomorrow-1000  . potassium chloride  20 mEq Oral BID  . sodium chloride  3 mL Intravenous Q12H  . sodium chloride  3 mL Intravenous Q12H  . spironolactone  25 mg Oral Daily  . DISCONTD: lisinopril  15 mg Oral BID   Infusions:    . sodium chloride 250 mL (09/24/11 0600)  . milrinone 0.125 mcg/kg/min (09/24/11 0600)    Objective:  Blood pressure 83/51, pulse 77, temperature 97.7 F (36.5 C), temperature source Oral, resp. rate 16, height 6\' 4"  (1.93 m), weight 116 kg (255 lb 11.7 oz), SpO2 95.00%. Weight change: -6.6 kg (-14 lb 8.8 oz) Admit weight: 127.5kg (281 pounds)   Physical Exam: General:  Well appearing. No resp difficulty HEENT: normal Neck: supple. JVP flat.  Carotids 2+ bilat; no bruits. No lymphadenopathy or thryomegaly appreciated. Cor: PMI laterally displaced. Regular rate & rhythm. (agree-DB) Lungs: CTA Abdomen: soft, nontender, nondistended. No hepatosplenomegaly. No bruits or masses. Good bowel sounds. Extremities: no cyanosis, clubbing, rash, no edema. Extremities warm Neuro: alert & orientedx3, cranial nerves grossly intact. moves all 4 extremities w/o difficulty. Affect pleasant  Telemetry:  Sinus rhythm 98  Lab Results:  Results for orders placed during the hospital encounter of 09/19/11 (from the past 24 hour(s))  GLUCOSE, CAPILLARY     Status: Normal   Collection Time   09/23/11  7:47 AM      Component Value Range   Glucose-Capillary 93  70 - 99 (mg/dL)  GLUCOSE, CAPILLARY     Status: Normal   Collection Time   09/23/11 11:57 AM      Component Value Range   Glucose-Capillary 93  70 - 99 (mg/dL)  GLUCOSE, CAPILLARY     Status: Abnormal   Collection Time   09/23/11  3:57 PM      Component Value Range   Glucose-Capillary 107 (*) 70 - 99 (mg/dL)  BASIC METABOLIC PANEL     Status: Abnormal   Collection Time   09/24/11  5:53 AM      Component Value Range   Sodium 136  135 - 145 (mEq/L)   Potassium  4.5  3.5 - 5.1 (mEq/L)   Chloride 104  96 - 112 (mEq/L)   CO2 24  19 - 32 (mEq/L)   Glucose, Bld 83  70 - 99 (mg/dL)   BUN 17  6 - 23 (mg/dL)   Creatinine, Ser 8.29  0.50 - 1.35 (mg/dL)   Calcium 9.5  8.4 - 56.2 (mg/dL)   GFR calc non Af Amer 80 (*) >90 (mL/min)   GFR calc Af Amer >90  >90 (mL/min)  PRO B NATRIURETIC PEPTIDE     Status: Abnormal   Collection Time   09/24/11  5:56 AM      Component Value Range   BNP, POC 490.2 (*) 0 - 125 (pg/mL)    ASSESSMENT:  1) Acute on chronic systolic HF      (agree - DB) --NICM EF 5-10%  2) Tobacco use, ongoing  3) h/o HTN, controlled  4) Non-obstructive CAD by cath 2006  5) h/o medication non-compliance - improved 6) NSVT - 37 beats in ER   PLAN/DISCUSSION:  He continues to  improve. Will stop Milrinone. Will increase Beta Blocker . Will increase lasix 80 mg in am and 40 mg in pm (home dose).Will stop Potassium. Life Vest to be delivered tomorrow.  Re-educated on daily documenting daily weights.  Anticipate discharge Saturday with very close CHF clinic f/u.[facility] October 01, 2011 at 10:15.  Will ask case manager/social work to assist with discharge medications.   Amy Clegg NP-C  Looks good. Will stop milrinone today and plan d/c tomorrow on current meds. Will need close f/u in HF clinic.   Patient seen and examined with Tonye Becket, NP. We discussed all aspects of the encounter. I agree with the assessment and plan as stated above.   [name] MD

## 2011-09-25 LAB — GLUCOSE, CAPILLARY: Glucose-Capillary: 110 mg/dL — ABNORMAL HIGH (ref 70–99)

## 2011-09-25 LAB — BASIC METABOLIC PANEL
BUN: 22 mg/dL (ref 6–23)
Chloride: 98 mEq/L (ref 96–112)
Glucose, Bld: 87 mg/dL (ref 70–99)
Potassium: 4 mEq/L (ref 3.5–5.1)

## 2011-09-25 MED ORDER — DIGOXIN 250 MCG PO TABS: 0.2500 mg | ORAL_TABLET | Freq: Every day | ORAL | Status: DC

## 2011-09-25 MED ORDER — FUROSEMIDE 40 MG PO TABS: 40.0000 mg | ORAL_TABLET | Freq: Two times a day (BID) | ORAL | Status: DC

## 2011-09-25 MED ORDER — CARVEDILOL 12.5 MG PO TABS: 12.5000 mg | ORAL_TABLET | Freq: Two times a day (BID) | ORAL | Status: DC

## 2011-09-25 MED ORDER — LISINOPRIL 20 MG PO TABS: 20.0000 mg | ORAL_TABLET | Freq: Two times a day (BID) | ORAL | Status: DC

## 2011-09-25 NOTE — Progress Notes (Signed)
Reviewed dc instructions. Pt states understanding. Life vest to go home with patient. Pt states lost his shoes on 2600. 2600 charge rn denise muncy checked for clothing and nothing found. Pt to call director  On MOnday.  Tammy Sours

## 2011-09-25 NOTE — Discharge Summary (Signed)
Physician Discharge Summary  Patient ID: Douglas [name] MRN: [id number] DOB/AGE: 1976/09/06 35 y.o.  Admit date: 09/19/2011 Discharge date: 09/25/2011  Primary Discharge Diagnosis: Acute on chronic systolic HF   Secondary Discharge Diagnosis: 1.--NICM EF 5-10%  2) Tobacco use, ongoing  3) h/o HTN, controlled  4) Non-obstructive CAD by cath 2006  5) h/o medication non-compliance - improved  6) NSVT - 37 beats in ER  --seen By EP. Will place LifeVest. Needs 3 months of optimal medical therapy prior to ICD  7) DM   Significant Diagnostic Studies: R/L heart cath, CXR   Hospital Course: Over past week much more SOB and profound DOE with NYHA IIIb-IV symptoms. + abdominal bloating, LE edema and orthopnea. Has been doubling up on lasix without much benefit. Weight up about 6-7 pounds but feels it may be back down a bit now. + non-productive cough. No CP.  Came to ER today. CXR with pulm edema. Trop 0.63. BNP 2096. Pt admitted for eval/diuresis.  Pt diuresed and taken to the cath lab on 11-5. Assessment:  1. Mild non-obstructive CAD  2. Severe LV dysfunction due to NICM, EF 5-10%. 3. Markedly elevated filling pressures with mildly reduced cardiac output.  4. Mildly increased PVR  5. Ventricular tachycardia Start milrinone to assist diuresis. Over the next few days, wt decreased by 16 lbs. SOB/orthopnea and PND improved. No CP. Renal function/electrolytes followed closely and supplemented PRN.  By 11-10, pt cond much improved. Sats 97% on room air. Ambulating well. Dr [name] evaluated Douglas [name] and considered him stable for d/c with close OP f/u arranged.  Discharge Exam: Blood pressure 99/61, pulse 89, temperature 98.4 F (36.9 C), temperature source Oral, resp. rate 18, height 6\' 4"  (1.93 m), weight 265 lb 10.5 oz (120.5 kg), SpO2 97.00%.   Labs:   Lab Results  Component Value Date   WBC 6.4 09/19/2011   HGB 14.3 09/19/2011   HCT 40.6 09/19/2011   MCV 86.4 09/19/2011   PLT  207 09/19/2011    Lab 09/25/11 0545  NA 134*  K 4.0  CL 98  CO2 25  BUN 22  CREATININE 1.12  CALCIUM 9.6  PROT --  BILITOT --  ALKPHOS --  ALT --  AST --  GLUCOSE 87   Lab Results  Component Value Date   CKTOTAL 283* 09/19/2011   CKMB 4.3* 09/19/2011   TROPONINI 0.63* 09/19/2011      FOLLOW UP PLANS AND APPOINTMENTS Discharge Orders    Future Appointments: Provider: Department: Dept Phone: Center:   10/01/2011 10:15 AM [facility] [phone] N/A     Future Orders Please Complete By Expires   Diet - low sodium heart healthy      Increase activity slowly      Heart Failure patients record your daily weight using the same scale at the same time of day      Avoid straining      STOP any activity that causes chest pain, shortness of breath, dizziness, sweating, or exessive weakness      (HEART FAILURE PATIENTS) Call MD:  Anytime you have any of the following symptoms: 1) 3 pound weight gain in 24 hours or 5 pounds in 1 week 2) shortness of breath, with or without a dry hacking cough 3) swelling in the hands, feet or stomach 4) if you have to sleep on extra pillows at night in order to breathe.      ACE Inhibitor / ARB already ordered  Current Discharge Medication List    START taking these medications   Details  digoxin (LANOXIN) 0.25 MG tablet Take 1 tablet (0.25 mg total) by mouth daily. Qty: 30 tablet, Refills: 11      CONTINUE these medications which have CHANGED   Details  carvedilol (COREG) 12.5 MG tablet Take 1 tablet (12.5 mg total) by mouth 2 (two) times daily with a meal. Qty: 60 tablet, Refills: 11   Associated Diagnoses: Unspecified essential hypertension    furosemide (LASIX) 40 MG tablet Take 1 tablet (40 mg total) by mouth 2 (two) times daily. TAKE 2 TABS AM, 1 TAB PM Qty: 90 tablet, Refills: 11    lisinopril (PRINIVIL,ZESTRIL) 20 MG tablet Take 1 tablet (20 mg total) by mouth 2 (two) times daily. Qty: 30 tablet,  Refills: 11      CONTINUE these medications which have NOT CHANGED   Details  aspirin 81 MG tablet Take 81 mg by mouth daily.      OVER THE COUNTER MEDICATION Take by mouth 3 (three) times daily as needed. Robitussin daytime.     spironolactone (ALDACTONE) 25 MG tablet Take 1 tablet (25 mg total) by mouth daily. Qty: 30 tablet, Refills: 6       Follow-up Information    Follow up with [facility] on 11/22/2011. (11:15 am with physician)       Follow up with [facility] on 11/18/2011. (10:30 Eligibility with Case Manager)       Follow up with [name], MD on 10/01/2011. (10:15)    Contact information:   Heart Failure  [address] [place] [address] [phone]          BRING ALL MEDICATIONS WITH YOU TO FOLLOW UP APPOINTMENTS  Time spent with patient to include physician time: Signed: Theodore Demark 09/25/2011, 9:02 AM Co-Sign MD

## 2011-09-25 NOTE — Progress Notes (Cosign Needed)
Subjective:   Lying in bed comfortably. I/Os negative but weight recorded as up. Denies CP.SOB/orthopnea/PND.   11/5 Cath: Mild non-obstructive CAD. RHC  RA = 17 RV = 66/14/20 PA = 73/34 (51) PCW = mean 32 V = 40-45 Fick cardiac output/index = 5.0/1.9 (using calculated O2 consumption) Fick cardiac output/index = 5.0/1.9 (using measured O2 consumption) PVR = 3.80 wwods SVR = 1487 FA sat = 90% PA sat = 53%,55% SVC sat = 54%   Intake/Output Summary (Last 24 hours) at 09/25/11 0744 Last data filed at 09/25/11 0600  Gross per 24 hour  Intake 1304.6 ml  Output   2951 ml  Net -1646.4 ml    Current meds:    . aspirin  81 mg Oral Daily  . carvedilol  12.5 mg Oral BID WC  . digoxin  0.25 mg Oral Daily  . furosemide  40 mg Oral Daily  . furosemide  80 mg Oral Daily  . heparin  5,000 Units Subcutaneous Q8H  . lisinopril  20 mg Oral BID  . sodium chloride  3 mL Intravenous Q12H  . spironolactone  25 mg Oral Daily  . DISCONTD: carvedilol  9.375 mg Oral BID WC  . DISCONTD: furosemide  40 mg Oral Daily  . DISCONTD: potassium chloride  20 mEq Oral BID  . DISCONTD: sodium chloride  3 mL Intravenous Q12H   Infusions:    . DISCONTD: sodium chloride 250 mL (09/24/11 0900)  . DISCONTD: milrinone 0.125 mcg/kg/min (09/24/11 0800)    Objective:  Blood pressure 105/67, pulse 71, temperature 98.4 F (36.9 C), temperature source Oral, resp. rate 16, height 6\' 4"  (1.93 m), weight 120.5 kg (265 lb 10.5 oz), SpO2 98.00%. Weight change: 4.5 kg (9 lb 14.7 oz) Admit weight: 127.5kg (281 pounds)   Physical Exam: General:  Well appearing. No resp difficulty HEENT: normal Neck: supple. JVP flat. Carotids 2+ bilat; no bruits. No lymphadenopathy or thryomegaly appreciated. Cor: PMI laterally displaced. Regular rate & rhythm. Lungs: CTA Abdomen: soft, nontender, nondistended. No hepatosplenomegaly. No bruits or masses. Good bowel sounds. Extremities: no cyanosis, clubbing, rash, no edema. Extremities  warm Neuro: alert & orientedx3, cranial nerves grossly intact. moves all 4 extremities w/o difficulty. Affect pleasant  Telemetry:  Sinus rhythm 98  Lab Results:  Results for orders placed during the hospital encounter of 09/19/11 (from the past 24 hour(s))  GLUCOSE, CAPILLARY     Status: Abnormal   Collection Time   09/24/11 11:53 AM      Component Value Range   Glucose-Capillary 132 (*) 70 - 99 (mg/dL)   Comment 1 Documented in Chart     Comment 2 Notify RN    GLUCOSE, CAPILLARY     Status: Normal   Collection Time   09/24/11  5:03 PM      Component Value Range   Glucose-Capillary 94  70 - 99 (mg/dL)  GLUCOSE, CAPILLARY     Status: Abnormal   Collection Time   09/24/11  9:36 PM      Component Value Range   Glucose-Capillary 108 (*) 70 - 99 (mg/dL)   Comment 1 Notify RN     Comment 2 Documented in Chart    BASIC METABOLIC PANEL     Status: Abnormal   Collection Time   09/25/11  5:45 AM      Component Value Range   Sodium 134 (*) 135 - 145 (mEq/L)   Potassium 4.0  3.5 - 5.1 (mEq/L)   Chloride 98  96 - 112 (  mEq/L)   CO2 25  19 - 32 (mEq/L)   Glucose, Bld 87  70 - 99 (mg/dL)   BUN 22  6 - 23 (mg/dL)   Creatinine, Ser 4.78  0.50 - 1.35 (mg/dL)   Calcium 9.6  8.4 - 29.5 (mg/dL)   GFR calc non Af Amer 83 (*) >90 (mL/min)   GFR calc Af Amer >90  >90 (mL/min)    ASSESSMENT:  1) Acute on chronic systolic HF      --NICM EF 5-10%  2) Tobacco use, ongoing  3) h/o HTN, controlled  4) Non-obstructive CAD by cath 2006  5) h/o medication non-compliance - improved 6) NSVT - 37 beats in ER      --seen By EP. Will place LifeVest. Needs 3 months of optimal medical therapy prior to ICD  PLAN/DISCUSSION:    Doing well. Weight down 16 pounds from admit with help of milrinone support. OK for d/c today with LifeVest. Continue current meds as on med list. (Please pick up meds from pharmacy). Has f/u arranged in HF clinic on Friday. Knows to call if weight up more than 3 pounds or feeling  bad.    [name] MD

## 2011-09-25 NOTE — Plan of Care (Signed)
Problem: Discharge Progression Outcomes Goal: Complications resolved/controlled Outcome: Completed/Met Date Met:  09/25/11 Life vest

## 2011-09-26 NOTE — Discharge Summary (Signed)
Pt seen and examine on day of discharge. Agree with above. Total MD time 45 mins.

## 2011-09-28 ENCOUNTER — Telehealth (HOSPITAL_COMMUNITY): Payer: Self-pay | Admitting: *Deleted

## 2011-09-28 NOTE — Telephone Encounter (Signed)
He reports stable weight. No weight gain. Tolerating all meds. He will follow up Friday.

## 2011-09-28 NOTE — Telephone Encounter (Signed)
Mr Desa called, he would like a call back.

## 2011-10-01 ENCOUNTER — Ambulatory Visit (HOSPITAL_COMMUNITY)
Admit: 2011-10-01 | Discharge: 2011-10-01 | Disposition: A | Discharge status: Home / Self Care | Payer: Self-pay | Source: Ambulatory Visit | Attending: Internal Medicine | Admitting: Internal Medicine

## 2011-10-01 ENCOUNTER — Encounter (HOSPITAL_COMMUNITY): Payer: Self-pay

## 2011-10-01 DIAGNOSIS — I10 Essential (primary) hypertension: Secondary | ICD-10-CM

## 2011-10-01 DIAGNOSIS — I5022 Chronic systolic (congestive) heart failure: Secondary | ICD-10-CM

## 2011-10-01 LAB — BASIC METABOLIC PANEL
Calcium: 9.3 mg/dL (ref 8.4–10.5)
Creatinine, Ser: 1.06 mg/dL (ref 0.50–1.35)
GFR calc non Af Amer: 89 mL/min — ABNORMAL LOW (ref >90)
Glucose, Bld: 100 mg/dL — ABNORMAL HIGH (ref 70–99)
Sodium: 134 mEq/L — ABNORMAL LOW (ref 135–145)

## 2011-10-01 MED ORDER — CARVEDILOL 12.5 MG PO TABS: 18.7500 mg | ORAL_TABLET | Freq: Two times a day (BID) | ORAL | Status: DC

## 2011-10-01 NOTE — Assessment & Plan Note (Addendum)
NYHA II-III. He is much improved. Volume status stable. Will increase carvedilol 18.75 mg bid. He is compliant with all medications. Check BMET. He does have follow up at [facility] in January for assistance with medications. Follow up in 2 weeks.

## 2011-10-01 NOTE — Progress Notes (Signed)
History of Present Illness:  Douglas [name] is a 35 y.o. male with a h/o HTN, HL, obesity and CHF due to NICM. Management has been complicated by severe non-compliance.   Underwent cath in 2006 which showed normal cors with EF 20-25%. Saw Dr. [name] in 2009 but has been lost to f/u since. Just out of prison Feb 2012 (for 1 year)  Saw [name] in the [place] office in April at which time he was out of all of his medications for several months. Had significant volume overload and BP uncontrolled. HF meds restarted. Referred to HF clinic for management. Seen last week in ER due to coughing and felt like his weight was up and he had SOB (notes unavailable) apparently treated for volume overload and referred back here.     August 2012 CPX text VO2 20 Discharged form [facility] 09/25/2011  Over past week much more SOB and profound DOE with NYHA IIIb-IV symptoms. + abdominal bloating, LE edema and orthopnea. Has been doubling up on lasix without much benefit. Weight up about 6-7 pounds but feels it may be back down a bit now. + non-productive cough. No CP. Came to ER today. CXR with pulm edema. Trop 0.63. BNP 2096. Pt admitted for eval/diuresis. Pt diuresed and taken to the cath lab on 11-5.Start milrinone to assist diuresis. Over the next few days, wt decreased by 16 lbs. SOB/orthopnea and PND improved. No CP. Renal function/electrolytes followed closely and supplemented PRN. By 11-10, pt cond much improved. Sats 97% on room air. Ambulating well. Dr [name] evaluated [name] and considered him stable for d/c with close OP f/u arranged  He is here for post hospitalization. Feels good. He is wearing life vest. Weight at home 124-125 kg. Denies SOB/CP/PND/Orthopnea. No lower extremity edema. Following low salt diet. Limiting fluids. Compliant with all medications.     Past Medical History  Diagnosis Date  . Hypertension   . Hyperlipidemia   . Systolic CHF, chronic   . Obesity   .  Nonischemic cardiomyopathy     a.  echo 4/06: EF 30%, mild to mod MR, mild RAE, inf HK, lat HK , ant HK;    b.  cath 4/06: no CAD, EF 20-25%  . ED (erectile dysfunction)   . NSVT (nonsustained ventricular tachycardia)     eval by EP in past; no ICD candidate due to NYHA 1 symptoms  . CHF (congestive heart failure)     Current Outpatient Prescriptions  Medication Sig Dispense Refill  . aspirin 81 MG tablet Take 81 mg by mouth daily.        . carvedilol (COREG) 12.5 MG tablet Take 1 tablet (12.5 mg total) by mouth 2 (two) times daily with a meal.  60 tablet  11  . digoxin (LANOXIN) 0.25 MG tablet Take 1 tablet (0.25 mg total) by mouth daily.  30 tablet  11  . furosemide (LASIX) 40 MG tablet Take 1 tablet (40 mg total) by mouth 2 (two) times daily. TAKE 2 TABS AM, 1 TAB PM  90 tablet  11  . lisinopril (PRINIVIL,ZESTRIL) 20 MG tablet Take 1 tablet (20 mg total) by mouth 2 (two) times daily.  30 tablet  11  . OVER THE COUNTER MEDICATION Take by mouth 3 (three) times daily as needed. Robitussin daytime.       Marland Kitchen spironolactone (ALDACTONE) 25 MG tablet Take 1 tablet (25 mg total) by mouth daily.  30 tablet  6  . DISCONTD:  furosemide (LASIX) 40 MG tablet Take 1 tablet (40 mg total) by mouth daily.  30 tablet  11    Allergies  Allergen Reactions  . Penicillins Other (See Comments)    Unknown     History  Substance Use Topics  . Smoking status: Current Some Day Smoker -- 0.3 packs/day    Types: Cigarettes  . Smokeless tobacco: Former Neurosurgeon    Quit date: 06/20/2011  . Alcohol Use: 0.5 oz/week    1 drink(s) per week    Family History  Problem Relation Age of Onset  . Coronary artery disease Mother 64    s/p PCI  . Lung cancer Father   . Diabetes type II Maternal Uncle   . Coronary artery disease Maternal Uncle     ROS:  Please see the history of present illness.  He denies fevers, chills, melena, hematochezia.  All other systems reviewed and negative.  Vital Signs: BP 102/68   Pulse 60  Wt 278 lb (126.1 kg)  SpO2 100%  PHYSICAL EXAM: Well nourished, well developed, in no acute distress HEENT: normal Neck: JVP hard to see 5-6. Carotids 2+ bilaterally.No Bruits  Cardiac:  PMI laterally displaced. RRR with prominent  S3. 2/6 Lungs:  clear to auscultation bilaterally, no wheezing, rhonchi or rales Ab: obese. Soft NT/ND. No bruits.. Ext warm. no cyanosis or clubbing, no edema Skin: warm and dry Psych:normal affect. CNII-XII grossly intact  ASSESSMENT AND PLAN:

## 2011-10-01 NOTE — Patient Instructions (Signed)
Take carvedilol 18.75  1 1/2 tablets in am and pm  Continue weigh and record daily  Follow up in 2-3 weeks.

## 2011-10-07 NOTE — Progress Notes (Signed)
Patient seen and examined with Amy Clegg, NP. We discussed all aspects of the encounter. I agree with the assessment and plan as stated below.   

## 2011-10-18 ENCOUNTER — Telehealth (HOSPITAL_COMMUNITY): Payer: Self-pay | Admitting: *Deleted

## 2011-10-18 DIAGNOSIS — I10 Essential (primary) hypertension: Secondary | ICD-10-CM

## 2011-10-18 MED ORDER — CARVEDILOL 12.5 MG PO TABS: 18.7500 mg | ORAL_TABLET | Freq: Two times a day (BID) | ORAL | Status: DC

## 2011-10-18 MED ORDER — LISINOPRIL 20 MG PO TABS: 20.0000 mg | ORAL_TABLET | Freq: Two times a day (BID) | ORAL | Status: DC

## 2011-10-18 MED ORDER — SPIRONOLACTONE 25 MG PO TABS: 25.0000 mg | ORAL_TABLET | Freq: Every day | ORAL | Status: DC

## 2011-10-18 MED ORDER — DIGOXIN 250 MCG PO TABS: 0.2500 mg | ORAL_TABLET | Freq: Every day | ORAL | Status: DC

## 2011-10-18 NOTE — Telephone Encounter (Signed)
Mr Teron needs refills on his medications, he will be out by his next appt which is Friday.  Carvedilol, digoxin, lisinopril, spironolactone.  Thanks.

## 2011-10-22 ENCOUNTER — Ambulatory Visit (HOSPITAL_COMMUNITY)
Admission: RE | Admit: 2011-10-22 | Discharge: 2011-10-22 | Disposition: A | Discharge status: Home / Self Care | Payer: Self-pay | Source: Ambulatory Visit | Attending: Internal Medicine | Admitting: Internal Medicine

## 2011-10-22 VITALS — BP 118/78 | HR 70 | Wt 285.5 lb

## 2011-10-22 DIAGNOSIS — I5022 Chronic systolic (congestive) heart failure: Secondary | ICD-10-CM | POA: Insufficient documentation

## 2011-10-22 DIAGNOSIS — I10 Essential (primary) hypertension: Secondary | ICD-10-CM | POA: Insufficient documentation

## 2011-10-22 MED ORDER — CARVEDILOL 12.5 MG PO TABS: 25.0000 mg | ORAL_TABLET | Freq: Two times a day (BID) | ORAL | Status: DC

## 2011-10-22 MED ORDER — FUROSEMIDE 80 MG PO TABS: 80.0000 mg | ORAL_TABLET | Freq: Two times a day (BID) | ORAL | Status: DC

## 2011-10-22 NOTE — Patient Instructions (Signed)
Follow up 2 weeks   Carvedilol 25 mg bid  Take lasix 80 mg bid  Do the following things EVERYDAY: 1) Weigh yourself in the morning before breakfast. Write it down and keep it in a log. 2) Take your medicines as prescribed 3) Eat low salt foods-Limit salt (sodium) to 2000mg  per day.  4) Stay as active as you can everyday

## 2011-10-22 NOTE — Assessment & Plan Note (Addendum)
NYHA II-III. He is much improved. Volume status stable. Will increase carvedilol 25  mg bid. He is compliant with all medications. He does have follow up at [facility] in January for assistance with medications. Follow up in 2 weeks.   Patient seen and examined with Tonye Becket, NP. We discussed all aspects of the encounter. I agree with the assessment and plan as stated above. Reminded him for need to let us know in advance when he is running out of his medications. Arrangements made for him to get his meds.

## 2011-10-22 NOTE — Progress Notes (Signed)
Patient ID: Douglas [name], male   DOB: 06-15-1976, 35 y.o.   MRN: [id number]    History of Present Illness:  Douglas [name] is a 35 y.o. male with a h/o HTN, HL, obesity and CHF due to NICM. Management has been complicated by severe non-compliance.   Underwent cath in 2006 which showed normal cors with EF 20-25%. Saw Dr. [name] in 2009 but has been lost to f/u since. Just out of prison Feb 2012 (for 1 year)  Saw [name] in the [place] office in April at which time he was out of all of his medications for several months. Had significant volume overload and BP uncontrolled. HF meds restarted. Referred to HF clinic for management. Seen last week in ER due to coughing and felt like his weight was up and he had SOB (notes unavailable) apparently treated for volume overload and referred back here.     August 2012 CPX text VO2 20 Discharged form [facility] 09/25/2011  Over past week much more SOB and profound DOE with NYHA IIIb-IV symptoms. + abdominal bloating, LE edema and orthopnea. Has been doubling up on lasix without much benefit. Weight up about 6-7 pounds but feels it may be back down a bit now. + non-productive cough. No CP. Came to ER today. CXR with pulm edema. Trop 0.63. BNP 2096. Pt admitted for eval/diuresis. Pt diuresed and taken to the cath lab on 11-5.Start milrinone to assist diuresis. Over the next few days, wt decreased by 16 lbs. SOB/orthopnea and PND improved. No CP. Renal function/electrolytes followed closely and supplemented PRN. By 11-10, pt cond much improved. Sats 97% on room air. Ambulating well. Dr [name] evaluated Douglas [name] and considered him stable for d/c with close OP f/u arranged  He is here for follow up. Feels good. Denies SOB/PND/Orthonpnea.  He is wearing life vest. Weight at home 127-129 kg. He is taking extra lasix 2 times a week. Taking all medications. Obtaining medications from [facility]. Working on [employer] application and he is trying to get an  appointment.  Walking 1-2 miles per day. No lower extremity edema. Following low salt diet. Limiting fluids. Not smoking. Denies alcohol. Lives with his mother.      Past Medical History  Diagnosis Date  . Hypertension   . Hyperlipidemia   . Systolic CHF, chronic   . Obesity   . Nonischemic cardiomyopathy     a.  echo 4/06: EF 30%, mild to mod Douglas, mild RAE, inf HK, lat HK , ant HK;    b.  cath 4/06: no CAD, EF 20-25%  . ED (erectile dysfunction)   . NSVT (nonsustained ventricular tachycardia)     eval by EP in past; no ICD candidate due to NYHA 1 symptoms  . CHF (congestive heart failure)     Current Outpatient Prescriptions  Medication Sig Dispense Refill  . aspirin 81 MG tablet Take 81 mg by mouth daily.        . carvedilol (COREG) 12.5 MG tablet Take 1.5 tablets (18.75 mg total) by mouth 2 (two) times daily with a meal.  90 tablet  3  . digoxin (LANOXIN) 0.25 MG tablet Take 1 tablet (0.25 mg total) by mouth daily.  30 tablet  11  . furosemide (LASIX) 80 MG tablet Take 1 tab every AM and 1/2 tab every PM       . lisinopril (PRINIVIL,ZESTRIL) 20 MG tablet Take 1 tablet (20 mg total) by mouth 2 (two) times daily.  60 tablet  6  . spironolactone (ALDACTONE) 25 MG tablet Take 1 tablet (25 mg total) by mouth daily.  30 tablet  6    Allergies  Allergen Reactions  . Penicillins Other (See Comments)    Unknown     History  Substance Use Topics  . Smoking status: Former Smoker -- 0.3 packs/day    Types: Cigarettes    Quit date: 09/17/2011  . Smokeless tobacco: Former Neurosurgeon    Quit date: 06/20/2011  . Alcohol Use: 0.5 oz/week    1 drink(s) per week    Family History  Problem Relation Age of Onset  . Coronary artery disease Mother 51    s/p PCI  . Lung cancer Father   . Diabetes type II Maternal Uncle   . Coronary artery disease Maternal Uncle     ROS:  Please see the history of present illness.  He denies fevers, chills, melena, hematochezia.  All other systems reviewed  and negative.  Vital Signs: BP 118/78  Pulse 70  Wt 285 lb 8 oz (129.502 kg)  SpO2 98% Weight 285 (278) PHYSICAL EXAM: Well nourished, well developed, in no acute distress HEENT: normal Neck: JVP hard to see 5-6. Carotids 2+ bilaterally.No Bruits  Cardiac:  PMI laterally displaced. RRR with prominent  S1 S2 Lungs:  clear to auscultation bilaterally, no wheezing, rhonchi or rales Ab: obese. Soft NT/ND. No bruits.. Ext warm. no cyanosis or clubbing, trace  edema Skin: warm and dry Psych:normal affect. CNII-XII grossly intact  ASSESSMENT AND PLAN:

## 2011-10-25 NOTE — Progress Notes (Signed)
Patient seen and examined with Amy Clegg, NP. We discussed all aspects of the encounter. I agree with the assessment and plan as stated below.   

## 2011-11-01 ENCOUNTER — Encounter (HOSPITAL_COMMUNITY): Payer: Self-pay

## 2011-11-01 ENCOUNTER — Ambulatory Visit (HOSPITAL_COMMUNITY)
Admission: RE | Admit: 2011-11-01 | Discharge: 2011-11-01 | Disposition: A | Discharge status: Home / Self Care | Payer: Self-pay | Source: Ambulatory Visit | Attending: Internal Medicine | Admitting: Internal Medicine

## 2011-11-01 VITALS — BP 112/68 | HR 66 | Wt 283.5 lb

## 2011-11-01 DIAGNOSIS — I5022 Chronic systolic (congestive) heart failure: Secondary | ICD-10-CM

## 2011-11-01 DIAGNOSIS — I5032 Chronic diastolic (congestive) heart failure: Secondary | ICD-10-CM | POA: Insufficient documentation

## 2011-11-01 NOTE — Progress Notes (Signed)
Patient ID: Douglas [name], male   DOB: 06-07-1976, 35 y.o.   MRN: [id number] Patient ID: Douglas [name], male   DOB: 16-Sep-1976, 35 y.o.   MRN: [id number]    History of Present Illness:  Douglas [name] is a 35 y.o. male with a h/o HTN, HL, obesity and CHF due to NICM. Management has been complicated by severe non-compliance.   Underwent cath in 2006 which showed normal cors with EF 20-25%. Saw Dr. [name] in 2009 but has been lost to f/u since. Just out of prison Feb 2012 (for 1 year)  Saw [name] in the [place] office in April at which time he was out of all of his medications for several months. Had significant volume overload and BP uncontrolled. HF meds restarted. Referred to HF clinic for management. Seen last week in ER due to coughing and felt like his weight was up and he had SOB (notes unavailable) apparently treated for volume overload and referred back here.     August 2012 CPX text VO2 20 Discharged form [facility] 09/25/2011  Over past week much more SOB and profound DOE with NYHA IIIb-IV symptoms. + abdominal bloating, LE edema and orthopnea. Has been doubling up on lasix without much benefit. Weight up about 6-7 pounds but feels it may be back down a bit now. + non-productive cough. No CP. Came to ER today. CXR with pulm edema. Trop 0.63. BNP 2096. Pt admitted for eval/diuresis. Pt diuresed and taken to the cath lab on 11-5.Start milrinone to assist diuresis. Over the next few days, wt decreased by 16 lbs. SOB/orthopnea and PND improved. No CP. Renal function/electrolytes followed closely and supplemented PRN. By 11-10, pt cond much improved. Sats 97% on room air. Ambulating well. Dr [name] evaluated Douglas [name] and considered him stable for d/c with close OP f/u arranged  He is here for follow up. Complains of R thigh pain.  Denies SOB/PND/Orthonpnea.  He is wearing life vest. Weight at home 127-129 kg. He is taking one extra lasix 2 times a week. Taking all  medications. Obtaining medications from [facility]. Working on [employer] application and he is trying to get an appointment.  Walking 2 miles per day. No lower extremity edema. Following low salt diet. Limiting fluids. Not smoking. Denies alcohol. Lives with his mother.      Past Medical History  Diagnosis Date  . Hypertension   . Hyperlipidemia   . Systolic CHF, chronic   . Obesity   . Nonischemic cardiomyopathy     a.  echo 4/06: EF 30%, mild to mod Douglas, mild RAE, inf HK, lat HK , ant HK;    b.  cath 4/06: no CAD, EF 20-25%  . ED (erectile dysfunction)   . NSVT (nonsustained ventricular tachycardia)     eval by EP in past; no ICD candidate due to NYHA 1 symptoms  . CHF (congestive heart failure)     Current Outpatient Prescriptions  Medication Sig Dispense Refill  . aspirin 81 MG tablet Take 81 mg by mouth daily.        . carvedilol (COREG) 12.5 MG tablet Take 2 tablets (25 mg total) by mouth 2 (two) times daily with a meal.  90 tablet  3  . digoxin (LANOXIN) 0.25 MG tablet Take 1 tablet (0.25 mg total) by mouth daily.  30 tablet  11  . furosemide (LASIX) 80 MG tablet Take 80 mg by mouth 2 (two) times daily. Take 1 tab every AM and 1  tab every PM       . lisinopril (PRINIVIL,ZESTRIL) 20 MG tablet Take 1 tablet (20 mg total) by mouth 2 (two) times daily.  60 tablet  6  . spironolactone (ALDACTONE) 25 MG tablet Take 1 tablet (25 mg total) by mouth daily.  30 tablet  6    Allergies  Allergen Reactions  . Penicillins Other (See Comments)    Unknown     History  Substance Use Topics  . Smoking status: Former Smoker -- 0.3 packs/day    Types: Cigarettes    Quit date: 09/17/2011  . Smokeless tobacco: Former Neurosurgeon    Quit date: 06/20/2011  . Alcohol Use: 0.5 oz/week    1 drink(s) per week    Family History  Problem Relation Age of Onset  . Coronary artery disease Mother 15    s/p PCI  . Lung cancer Father   . Diabetes type II Maternal Uncle   . Coronary artery disease Maternal  Uncle     ROS:  Please see the history of present illness.  He denies fevers, chills, melena, hematochezia.  All other systems reviewed and negative.  Vital Signs: BP 112/68  Pulse 66  Wt 283 lb 8 oz (128.595 kg)  SpO2 98% Weight 283 (285) PHYSICAL EXAM: Well nourished, well developed, in no acute distress HEENT: normal Neck: JVP hard to see 5-6. Carotids 2+ bilaterally.No Bruits  Cardiac:  PMI laterally displaced. RRR with prominent  S1 S2 Lungs:  clear to auscultation bilaterally, no wheezing, rhonchi or rales Ab: obese. Soft NT/ND. No bruits.. Ext warm. no cyanosis or clubbing, trace  edema Skin: warm and dry Psych:normal affect. CNII-XII grossly intact  ASSESSMENT AND PLAN:

## 2011-11-01 NOTE — Patient Instructions (Addendum)
Do the following things EVERYDAY: 1) Weigh yourself in the morning before breakfast. Write it down and keep it in a log. 2) Take your medicines as prescribed 3) Eat low salt foods-Limit salt (sodium) to 2000mg  per day.  4) Stay as active as you can everyday  Please go to  [facility] for Lab work in two weeks.  Follow at [facility] November 18, 2011 at 10:30 for Eligibililty meeting    Follow up in 4 weeks.

## 2011-11-01 NOTE — Assessment & Plan Note (Addendum)
NYHA II-III. He is much improved. Volume status stable. He is compliant with all medications. He is at goal dose for Ace Inhibitor and Beta Blocker. He has follow up at [facility] November 18, 2011 eligibility meeting for assistance with  medications. Follow up in 3 weeks. BMET in two weeks. Repeat ECHO late January.   Patient seen and examined with Tonye Becket, NP. We discussed all aspects of the encounter. I agree with the assessment and plan as stated above.  He looks great. Continue current regimen. Recheck echo soon.

## 2011-11-30 ENCOUNTER — Ambulatory Visit (HOSPITAL_COMMUNITY)
Admission: RE | Admit: 2011-11-30 | Discharge: 2011-11-30 | Disposition: A | Discharge status: Home / Self Care | Payer: Self-pay | Source: Ambulatory Visit | Attending: Internal Medicine | Admitting: Internal Medicine

## 2011-11-30 VITALS — BP 108/58 | HR 68 | Wt 297.5 lb

## 2011-11-30 DIAGNOSIS — I5022 Chronic systolic (congestive) heart failure: Secondary | ICD-10-CM | POA: Insufficient documentation

## 2011-11-30 NOTE — Patient Instructions (Signed)
Take extra dose of lasix tomorrow.  Follow up 1 month with echo.  Your physician has requested that you have an echocardiogram. Echocardiography is a painless test that uses sound waves to create images of your heart. It provides your doctor with information about the size and shape of your heart and how well your heart's chambers and valves are working. This procedure takes approximately one hour. There are no restrictions for this procedure.

## 2011-11-30 NOTE — Assessment & Plan Note (Addendum)
Weight up slightly.  NYHA II.  Will have him take an extra dose of lasix tomorrow.  Will be 3 months in Feb and will reassess EF for possible ICD placement.   Patient seen and examined with Ulyess Blossom PA-C. We discussed all aspects of the encounter. I agree with the assessment and plan as stated above.  Doing well. Just mild volume overload. Reinforced need for daily weights and reviewed use of sliding scale diuretics. Discussed need for repeat echo and possible ICD if EF not improving.

## 2011-11-30 NOTE — Progress Notes (Signed)
History of Present Illness:  Douglas [name] is a 36 y.o. male with a h/o HTN, HL, obesity and CHF due to NICM. Management has been complicated by severe non-compliance.   Underwent cath in 2006 which showed normal cors with EF 20-25%. Saw Dr. [name] in 2009 but has been lost to f/u since. Just out of prison Feb 2012 (for 1 year)  Saw [name] in the [place] office in April at which time he was out of all of his medications for several months. Had significant volume overload and BP uncontrolled. HF meds restarted. Referred to HF clinic for management. Seen last week in ER due to coughing and felt like his weight was up and he had SOB (notes unavailable) apparently treated for volume overload and referred back here.   August 2012 CPX text VO2 20 Discharged form [facility] 09/25/2011  Here for f/u today.  Feeling good today.  Walking 1-1/5 miles 3-4x/week.  Denies SOB/orthopnea/PND.  Weight 129-133.  Always hungry.  Still wearing life vest, no shocks.   Compliant with meds.  Following low sodium diet.  No lower  Not smoking.  No alcohol.  1 episode of dizziness/syncope.  Life vest did not fire.      Past Medical History  Diagnosis Date  . Hypertension   . Hyperlipidemia   . Systolic CHF, chronic   . Obesity   . Nonischemic cardiomyopathy     a.  echo 4/06: EF 30%, mild to mod MR, mild RAE, inf HK, lat HK , ant HK;    b.  cath 4/06: no CAD, EF 20-25%  . ED (erectile dysfunction)   . NSVT (nonsustained ventricular tachycardia)     eval by EP in past; no ICD candidate due to NYHA 1 symptoms  . CHF (congestive heart failure)     Current Outpatient Prescriptions  Medication Sig Dispense Refill  . aspirin 81 MG tablet Take 81 mg by mouth daily.        . carvedilol (COREG) 12.5 MG tablet Take 2 tablets (25 mg total) by mouth 2 (two) times daily with a meal.  90 tablet  3  . digoxin (LANOXIN) 0.25 MG tablet Take 1 tablet (0.25 mg total) by mouth daily.  30 tablet  11  . furosemide  (LASIX) 80 MG tablet Take 80 mg by mouth 2 (two) times daily. Take 1 tab every AM and 1 tab every PM       . lisinopril (PRINIVIL,ZESTRIL) 20 MG tablet Take 1 tablet (20 mg total) by mouth 2 (two) times daily.  60 tablet  6  . spironolactone (ALDACTONE) 25 MG tablet Take 1 tablet (25 mg total) by mouth daily.  30 tablet  6    Allergies  Allergen Reactions  . Penicillins Other (See Comments)    Unknown     ROS:  Please see the history of present illness.  He denies fevers, chills, melena, hematochezia.  All other systems reviewed and negative.  Vital Signs: BP 108/58  Pulse 68  Wt 297 lb 8 oz (134.945 kg)  SpO2 99%  PHYSICAL EXAM: Well nourished, well developed, in no acute distress HEENT: normal Neck: JVP hard to see 6-7. Carotids 2+ bilaterally.No Bruits  Cardiac:  PMI laterally displaced. RRR with prominent  S1 S2 Lungs:  clear to auscultation bilaterally, no wheezing, rhonchi or rales Ab: obese. Soft NT/ND. No bruits.. Ext warm. no cyanosis or clubbing, trace edema Skin: warm and dry Psych:normal affect. CNII-XII grossly intact  ASSESSMENT AND  PLAN:

## 2011-12-02 ENCOUNTER — Telehealth (HOSPITAL_COMMUNITY): Payer: Self-pay | Admitting: *Deleted

## 2011-12-02 ENCOUNTER — Encounter (HOSPITAL_COMMUNITY): Payer: Self-pay | Admitting: Internal Medicine

## 2011-12-02 NOTE — Telephone Encounter (Signed)
Letter completed by Dr [name] and faxed to pt's attorney

## 2011-12-02 NOTE — Telephone Encounter (Signed)
Message copied by Noralee Space on Thu Dec 02, 2011  5:05 PM ------      Message from: Gwen Her      Created: Wed Dec 01, 2011 12:19 PM       Mr Navarez called with the fax number to his atty. It is [phone], attn Joslyn.  Thanks!

## 2011-12-29 ENCOUNTER — Ambulatory Visit (HOSPITAL_COMMUNITY)
Admission: RE | Admit: 2011-12-29 | Discharge: 2011-12-29 | Disposition: A | Discharge status: Home / Self Care | Payer: Self-pay | Source: Ambulatory Visit | Attending: Internal Medicine | Admitting: Internal Medicine

## 2011-12-29 DIAGNOSIS — I10 Essential (primary) hypertension: Secondary | ICD-10-CM | POA: Insufficient documentation

## 2011-12-29 DIAGNOSIS — I509 Heart failure, unspecified: Secondary | ICD-10-CM | POA: Insufficient documentation

## 2011-12-29 DIAGNOSIS — I369 Nonrheumatic tricuspid valve disorder, unspecified: Secondary | ICD-10-CM

## 2011-12-29 DIAGNOSIS — I5022 Chronic systolic (congestive) heart failure: Secondary | ICD-10-CM | POA: Insufficient documentation

## 2011-12-29 NOTE — Progress Notes (Signed)
*  PRELIMINARY RESULTS* Echocardiogram 2D Echocardiogram has been performed.  Glean Salen [facility] 12/29/2011, 3:42 PM

## 2011-12-30 ENCOUNTER — Ambulatory Visit (HOSPITAL_COMMUNITY)
Admission: RE | Admit: 2011-12-30 | Discharge: 2011-12-30 | Disposition: A | Discharge status: Home / Self Care | Payer: Self-pay | Source: Ambulatory Visit | Attending: Internal Medicine | Admitting: Internal Medicine

## 2011-12-30 VITALS — BP 118/72 | HR 63 | Wt 297.4 lb

## 2011-12-30 DIAGNOSIS — I5022 Chronic systolic (congestive) heart failure: Secondary | ICD-10-CM | POA: Insufficient documentation

## 2011-12-30 NOTE — Patient Instructions (Signed)
Continue current medication.  MAY REMOVE LIFEVEST!!!!!!  Do the following things EVERYDAY: 1) Weigh yourself in the morning before breakfast. Write it down and keep it in a log. 2) Take your medicines as prescribed 3) Eat low salt foods--Limit salt (sodium) to 2000mg  per day.  4) Stay as active as you can everyday   Follow up 3 months.

## 2011-12-30 NOTE — Progress Notes (Signed)
History of Present Illness:  Douglas [name] is a 36 y.o. male with a h/o HTN, HL, obesity and CHF due to NICM. Management has been complicated by severe non-compliance.   Underwent cath in 2006 which showed normal cors with EF 20-25%. Saw Dr. [name] in 2009 but has been lost to f/u since. Just out of prison Feb 2012 (for 1 year)  Saw [name] in the [place] office in April at which time he was out of all of his medications for several months. Had significant volume overload and BP uncontrolled. HF meds restarted. Referred to HF clinic for management.  August 2012 CPX text VO2 20 Discharged form [facility] 09/25/2011  Returns for follow up today.  He feels great.  Continues to walk 1-2 miles/day.  Denies SOB/orthopnea/PND.  Weighing daily, stable 134 kg.  Compliant with meds.  Reviewed echo, EF 55%.   Increased appetite.  Wearing LifeVest, no fires.    Past Medical History  Diagnosis Date  . Hypertension   . Hyperlipidemia   . Systolic CHF, chronic   . Obesity   . Nonischemic cardiomyopathy     a.  echo 4/06: EF 30%, mild to mod MR, mild RAE, inf HK, lat HK , ant HK;    b.  cath 4/06: no CAD, EF 20-25%  . ED (erectile dysfunction)   . NSVT (nonsustained ventricular tachycardia)     eval by EP in past; no ICD candidate due to NYHA 1 symptoms  . CHF (congestive heart failure)     Current Outpatient Prescriptions  Medication Sig Dispense Refill  . aspirin 81 MG tablet Take 81 mg by mouth daily.        . carvedilol (COREG) 12.5 MG tablet Take 2 tablets (25 mg total) by mouth 2 (two) times daily with a meal.  90 tablet  3  . digoxin (LANOXIN) 0.25 MG tablet Take 1 tablet (0.25 mg total) by mouth daily.  30 tablet  11  . furosemide (LASIX) 80 MG tablet Take 80 mg by mouth 2 (two) times daily. Take 1 tab every AM and 1 tab every PM       . lisinopril (PRINIVIL,ZESTRIL) 20 MG tablet Take 1 tablet (20 mg total) by mouth 2 (two) times daily.  60 tablet  6  . spironolactone  (ALDACTONE) 25 MG tablet Take 1 tablet (25 mg total) by mouth daily.  30 tablet  6    Allergies  Allergen Reactions  . Penicillins Other (See Comments)    Unknown     ROS:  Please see the history of present illness.  He denies fevers, chills, melena, hematochezia.  All other systems reviewed and negative.  Vital Signs: Filed Vitals:   12/30/11 1407  BP: 118/72  Pulse: 63  Weight: 297 lb 6.4 oz (134.9 kg)  SpO2: 100%    PHYSICAL EXAM: Well nourished, well developed, in no acute distress HEENT: normal Neck: JVP hard to see 6-7. Carotids 2+ bilaterally.No Bruits  Cardiac:  PMI laterally displaced. RRR with prominent  S1 S2 Lungs:  clear to auscultation bilaterally, no wheezing, rhonchi or rales Ab: obese. Soft NT/ND. No bruits.. Ext warm. no cyanosis or clubbing, trace edema Skin: warm and dry Psych:normal affect. CNII-XII grossly intact  ASSESSMENT AND PLAN:

## 2011-12-30 NOTE — Assessment & Plan Note (Addendum)
Systolic function has returned to normal.  Volume status looks good today.  Will continue current regimen.  With normalization of EF he does not need ICD implantation and we can remove his LifeVest.   Patient seen and examined with Ulyess Blossom PA-C. We discussed all aspects of the encounter. I agree with the assessment and plan as stated above.  Echo reviewed with him in clinic - EF now normalized. Will continue excellent medical regimen. Can d/c LifeVest.

## 2012-03-23 ENCOUNTER — Inpatient Hospital Stay (HOSPITAL_COMMUNITY): Admission: RE | Admit: 2012-03-23 | Payer: Self-pay | Source: Ambulatory Visit

## 2012-05-23 ENCOUNTER — Telehealth (HOSPITAL_COMMUNITY): Payer: Self-pay | Admitting: *Deleted

## 2012-05-23 NOTE — Telephone Encounter (Signed)
Appointment scheduled June 15, 2012 2:15 with Dr Dierdre Searles from internal medicine. Also meet with social worker 1:30 for insurance assistance.   [name] verbalized understanding.

## 2012-05-23 NOTE — Telephone Encounter (Signed)
Mr Reason called today to make a follow up appointment. He is stating that he is having leg and lower back pain and would like to know if you can get anything for him.  He has no PCP. He believes its from working, he has no history of a back injury. Please follow up. Thanks.

## 2012-06-15 ENCOUNTER — Encounter: Payer: Self-pay | Admitting: Internal Medicine

## 2012-06-19 ENCOUNTER — Encounter (HOSPITAL_COMMUNITY): Payer: Self-pay

## 2012-06-22 ENCOUNTER — Other Ambulatory Visit: Payer: Self-pay

## 2012-06-22 ENCOUNTER — Emergency Department (HOSPITAL_COMMUNITY): Payer: Self-pay

## 2012-06-22 ENCOUNTER — Encounter (HOSPITAL_COMMUNITY): Payer: Self-pay

## 2012-06-22 ENCOUNTER — Inpatient Hospital Stay (HOSPITAL_COMMUNITY)
Admission: EM | Admit: 2012-06-22 | Discharge: 2012-06-23 | DRG: 292 | Disposition: A | Discharge status: Home / Self Care | Payer: MEDICAID | Attending: Internal Medicine | Admitting: Internal Medicine

## 2012-06-22 DIAGNOSIS — I5022 Chronic systolic (congestive) heart failure: Secondary | ICD-10-CM

## 2012-06-22 DIAGNOSIS — R109 Unspecified abdominal pain: Secondary | ICD-10-CM

## 2012-06-22 DIAGNOSIS — E669 Obesity, unspecified: Secondary | ICD-10-CM | POA: Diagnosis present

## 2012-06-22 DIAGNOSIS — F17200 Nicotine dependence, unspecified, uncomplicated: Secondary | ICD-10-CM

## 2012-06-22 DIAGNOSIS — I428 Other cardiomyopathies: Secondary | ICD-10-CM

## 2012-06-22 DIAGNOSIS — Z7982 Long term (current) use of aspirin: Secondary | ICD-10-CM

## 2012-06-22 DIAGNOSIS — Z9119 Patient's noncompliance with other medical treatment and regimen: Secondary | ICD-10-CM

## 2012-06-22 DIAGNOSIS — E785 Hyperlipidemia, unspecified: Secondary | ICD-10-CM

## 2012-06-22 DIAGNOSIS — Z6833 Body mass index (BMI) 33.0-33.9, adult: Secondary | ICD-10-CM

## 2012-06-22 DIAGNOSIS — E119 Type 2 diabetes mellitus without complications: Secondary | ICD-10-CM

## 2012-06-22 DIAGNOSIS — I5033 Acute on chronic diastolic (congestive) heart failure: Principal | ICD-10-CM

## 2012-06-22 DIAGNOSIS — Z79899 Other long term (current) drug therapy: Secondary | ICD-10-CM

## 2012-06-22 DIAGNOSIS — I472 Ventricular tachycardia: Secondary | ICD-10-CM

## 2012-06-22 DIAGNOSIS — I2510 Atherosclerotic heart disease of native coronary artery without angina pectoris: Secondary | ICD-10-CM | POA: Diagnosis present

## 2012-06-22 DIAGNOSIS — I509 Heart failure, unspecified: Secondary | ICD-10-CM | POA: Diagnosis present

## 2012-06-22 DIAGNOSIS — Z91199 Patient's noncompliance with other medical treatment and regimen due to unspecified reason: Secondary | ICD-10-CM

## 2012-06-22 DIAGNOSIS — I5023 Acute on chronic systolic (congestive) heart failure: Secondary | ICD-10-CM

## 2012-06-22 DIAGNOSIS — N529 Male erectile dysfunction, unspecified: Secondary | ICD-10-CM | POA: Diagnosis present

## 2012-06-22 DIAGNOSIS — F1410 Cocaine abuse, uncomplicated: Secondary | ICD-10-CM | POA: Diagnosis present

## 2012-06-22 DIAGNOSIS — I10 Essential (primary) hypertension: Secondary | ICD-10-CM

## 2012-06-22 DIAGNOSIS — F1490 Cocaine use, unspecified, uncomplicated: Secondary | ICD-10-CM

## 2012-06-22 LAB — COMPREHENSIVE METABOLIC PANEL
BUN: 10 mg/dL (ref 6–23)
CO2: 27 mEq/L (ref 19–32)
Calcium: 9.2 mg/dL (ref 8.4–10.5)
Chloride: 108 mEq/L (ref 96–112)
Creatinine, Ser: 1.08 mg/dL (ref 0.50–1.35)
GFR calc Af Amer: >90 mL/min (ref >90)
GFR calc non Af Amer: 86 mL/min — ABNORMAL LOW (ref >90)
Total Bilirubin: 0.7 mg/dL (ref 0.3–1.2)

## 2012-06-22 LAB — CBC WITH DIFFERENTIAL/PLATELET
Eosinophils Relative: 1 % (ref 0–5)
HCT: 41.1 % (ref 39.0–52.0)
Hemoglobin: 14.3 g/dL (ref 13.0–17.0)
Lymphocytes Relative: 21 % (ref 12–46)
MCHC: 34.8 g/dL (ref 30.0–36.0)
MCV: 86.5 fL (ref 78.0–100.0)
Monocytes Absolute: 0.8 10*3/uL (ref 0.1–1.0)
Monocytes Relative: 10 % (ref 3–12)
Neutro Abs: 5 10*3/uL (ref 1.7–7.7)
RDW: 15.2 % (ref 11.5–15.5)
WBC: 7.4 10*3/uL (ref 4.0–10.5)

## 2012-06-22 LAB — RAPID URINE DRUG SCREEN, HOSP PERFORMED
Amphetamines: NOT DETECTED
Barbiturates: NOT DETECTED
Benzodiazepines: NOT DETECTED

## 2012-06-22 LAB — PROTIME-INR: INR: 1.15 (ref 0.00–1.49)

## 2012-06-22 LAB — CBC
HCT: 41.6 % (ref 39.0–52.0)
MCHC: 35.3 g/dL (ref 30.0–36.0)
MCV: 86.3 fL (ref 78.0–100.0)
RDW: 15.2 % (ref 11.5–15.5)

## 2012-06-22 LAB — URINALYSIS, ROUTINE W REFLEX MICROSCOPIC
Leukocytes, UA: NEGATIVE
Nitrite: NEGATIVE
Protein, ur: >300 mg/dL — AB
Specific Gravity, Urine: 1.026 (ref 1.005–1.030)
Urobilinogen, UA: 1 mg/dL (ref 0.0–1.0)

## 2012-06-22 LAB — CREATININE, SERUM: GFR calc non Af Amer: 77 mL/min — ABNORMAL LOW (ref >90)

## 2012-06-22 LAB — PRO B NATRIURETIC PEPTIDE: Pro B Natriuretic peptide (BNP): 2056 pg/mL — ABNORMAL HIGH (ref 0–125)

## 2012-06-22 LAB — POCT I-STAT TROPONIN I: Troponin i, poc: 0.41 ng/mL (ref 0.00–0.08)

## 2012-06-22 LAB — URINE MICROSCOPIC-ADD ON

## 2012-06-22 LAB — MAGNESIUM: Magnesium: 1.6 mg/dL (ref 1.5–2.5)

## 2012-06-22 LAB — APTT: aPTT: 29 seconds (ref 24–37)

## 2012-06-22 MED ORDER — SPIRONOLACTONE 25 MG PO TABS
25.0000 mg | ORAL_TABLET | Freq: Every day | ORAL | Status: DC
  Administered 2012-06-23: 25 mg via ORAL
  Filled 2012-06-22: qty 1

## 2012-06-22 MED ORDER — FUROSEMIDE 10 MG/ML IJ SOLN
160.0000 mg | INTRAVENOUS | Status: AC
  Administered 2012-06-22: 160 mg via INTRAVENOUS
  Filled 2012-06-22: qty 16

## 2012-06-22 MED ORDER — SODIUM CHLORIDE 0.9 % IJ SOLN: 3.0000 mL | INTRAMUSCULAR | Status: DC | PRN

## 2012-06-22 MED ORDER — GUAIFENESIN-DM 100-10 MG/5ML PO SYRP
5.0000 mL | ORAL_SOLUTION | Freq: Four times a day (QID) | ORAL | Status: DC | PRN
  Filled 2012-06-22: qty 5

## 2012-06-22 MED ORDER — SODIUM CHLORIDE 0.9 % IJ SOLN
3.0000 mL | Freq: Two times a day (BID) | INTRAMUSCULAR | Status: DC
  Administered 2012-06-22 – 2012-06-23 (×2): 3 mL via INTRAVENOUS

## 2012-06-22 MED ORDER — ASPIRIN 81 MG PO CHEW
324.0000 mg | CHEWABLE_TABLET | Freq: Once | ORAL | Status: AC
  Administered 2012-06-22: 324 mg via ORAL
  Filled 2012-06-22: qty 4

## 2012-06-22 MED ORDER — CARVEDILOL 3.125 MG PO TABS
3.1250 mg | ORAL_TABLET | Freq: Two times a day (BID) | ORAL | Status: DC
  Administered 2012-06-22 – 2012-06-23 (×2): 3.125 mg via ORAL
  Filled 2012-06-22 (×3): qty 1

## 2012-06-22 MED ORDER — ENOXAPARIN SODIUM 40 MG/0.4ML ~~LOC~~ SOLN
40.0000 mg | SUBCUTANEOUS | Status: DC
  Administered 2012-06-22: 40 mg via SUBCUTANEOUS
  Filled 2012-06-22 (×2): qty 0.4

## 2012-06-22 MED ORDER — FUROSEMIDE 80 MG PO TABS
80.0000 mg | ORAL_TABLET | Freq: Two times a day (BID) | ORAL | Status: DC
  Administered 2012-06-23: 80 mg via ORAL
  Filled 2012-06-22 (×2): qty 1

## 2012-06-22 MED ORDER — ONDANSETRON HCL 4 MG/2ML IJ SOLN: 4.0000 mg | Freq: Four times a day (QID) | INTRAMUSCULAR | Status: DC | PRN

## 2012-06-22 MED ORDER — LISINOPRIL 20 MG PO TABS
20.0000 mg | ORAL_TABLET | Freq: Two times a day (BID) | ORAL | Status: DC
  Administered 2012-06-22 – 2012-06-23 (×2): 20 mg via ORAL
  Filled 2012-06-22 (×3): qty 1

## 2012-06-22 MED ORDER — ASPIRIN EC 81 MG PO TBEC
81.0000 mg | DELAYED_RELEASE_TABLET | Freq: Every day | ORAL | Status: DC
  Administered 2012-06-22 – 2012-06-23 (×2): 81 mg via ORAL
  Filled 2012-06-22 (×2): qty 1

## 2012-06-22 MED ORDER — SODIUM CHLORIDE 0.9 % IV SOLN: 250.0000 mL | INTRAVENOUS | Status: DC | PRN

## 2012-06-22 MED ORDER — ACETAMINOPHEN 325 MG PO TABS
650.0000 mg | ORAL_TABLET | ORAL | Status: DC | PRN
  Administered 2012-06-23: 650 mg via ORAL
  Filled 2012-06-22: qty 2

## 2012-06-22 NOTE — ED Notes (Signed)
DR. [name] at bedside. Patient continues on Lasix gtt.  Denies chest pain at present. Family at bedside.

## 2012-06-22 NOTE — ED Notes (Signed)
Critical value Troponin 0.63 called from lab, reported to MD

## 2012-06-22 NOTE — ED Notes (Addendum)
Patient states "urinary retention x 1 day" .  History of CHF and out of medications- states "some shortness of breath as well.".

## 2012-06-22 NOTE — ED Notes (Signed)
Pt reports lower abd pain radiating to lower back, N/V/D starting last night, pt reports waking this am w/productive cough w/white color sputum w/blood streaks in sputum, SOB, and non cardiac chest pain, pt reports chest pain is when he coughs. nad

## 2012-06-22 NOTE — ED Provider Notes (Signed)
History     CSN: [id number]  Arrival date & time 06/22/12  1015   First MD Initiated Contact with Patient 06/22/12 1232      Chief Complaint  Patient presents with  . Abdominal Pain    (Consider location/radiation/quality/duration/timing/severity/associated sxs/prior treatment) HPI Comments: Douglas [name] is a 36 y.o. Male who began having upper abdominal pain, radiating to the lower back, , cough, and nausea last evening. Make sure he vomited once. This morning. He tried to drink some water and vomited a couple times. He saw a small amount of blood in the vomit at the end of that bout. He denies chest pain, shortness of breath, weakness, or dizziness. No fever. The cough is nonproductive. There are no palliative factors.  Patient is a 36 y.o. male presenting with abdominal pain. The history is provided by the patient.  Abdominal Pain The primary symptoms of the illness include abdominal pain.    Past Medical History  Diagnosis Date  . Hypertension   . Hyperlipidemia   . Systolic CHF, chronic   . Obesity   . Nonischemic cardiomyopathy     a.  echo 4/06: EF 30%, mild to mod MR, mild RAE, inf HK, lat HK , ant HK;    b.  cath 4/06: no CAD, EF 20-25%  . ED (erectile dysfunction)   . NSVT (nonsustained ventricular tachycardia)     eval by EP in past; no ICD candidate due to NYHA 1 symptoms  . CHF (congestive heart failure)     History reviewed. No pertinent past surgical history.  Family History  Problem Relation Age of Onset  . Coronary artery disease Mother 29    s/p PCI  . Lung cancer Father   . Diabetes type II Maternal Uncle   . Coronary artery disease Maternal Uncle     History  Substance Use Topics  . Smoking status: Former Smoker -- 0.3 packs/day    Types: Cigarettes    Quit date: 09/17/2011  . Smokeless tobacco: Former Neurosurgeon    Quit date: 06/20/2011  . Alcohol Use: 0.5 oz/week    1 drink(s) per week      Review of Systems  Gastrointestinal: Positive  for abdominal pain.  All other systems reviewed and are negative.    Allergies  Penicillins  Home Medications   No current outpatient prescriptions on file.  BP 130/79  Pulse 108  Temp 99.1 F (37.3 C) (Oral)  Resp 22  Ht 6\' 4"  (1.93 m)  Wt 282 lb 1.6 oz (127.96 kg)  BMI 34.34 kg/m2  SpO2 94%  Physical Exam  Nursing note and vitals reviewed. Constitutional: He is oriented to person, place, and time. He appears well-developed and well-nourished.  HENT:  Head: Normocephalic and atraumatic.  Right Ear: External ear normal.  Left Ear: External ear normal.  Eyes: Conjunctivae and EOM are normal. Pupils are equal, round, and reactive to light.  Neck: Normal range of motion and phonation normal. Neck supple.  Cardiovascular: Normal rate, regular rhythm, normal heart sounds and intact distal pulses.   Pulmonary/Chest: Effort normal and breath sounds normal. He exhibits no bony tenderness.  Abdominal: Soft. Normal appearance. He exhibits no mass. There is no tenderness. There is no guarding.  Musculoskeletal: Normal range of motion.  Neurological: He is alert and oriented to person, place, and time. He has normal strength. No cranial nerve deficit or sensory deficit. He exhibits normal muscle tone. Coordination normal.  Skin: Skin is warm, dry and intact.  Psychiatric: His behavior is normal. Judgment and thought content normal.       He appears anxious    ED Course  Procedures (including critical care time)  The patient was seen in the ED by Dr. [name]- he felt that the patient did not have ACS. He did not think that the patient did not need    further troponin; cycling.    Date: 06/22/2012  Rate: 84  Rhythm: normal sinus rhythm  QRS Axis: normal  Intervals: QT prolonged  ST/T Wave abnormalities: nonspecific T wave changes  Conduction Disutrbances:none  Narrative Interpretation:   Old EKG Reviewed: unchanged   Labs Reviewed  COMPREHENSIVE METABOLIC PANEL -  Abnormal; Notable for the following:    Glucose, Bld 108 (*)     Albumin 3.3 (*)     GFR calc non Af Amer 86 (*)     All other components within normal limits  URINALYSIS, ROUTINE W REFLEX MICROSCOPIC - Abnormal; Notable for the following:    Color, Urine AMBER (*)  BIOCHEMICALS MAY BE AFFECTED BY COLOR   Bilirubin Urine SMALL (*)     Ketones, ur 15 (*)     Protein, ur >300 (*)     All other components within normal limits  POCT I-STAT TROPONIN I - Abnormal; Notable for the following:    Troponin i, poc 0.41 (*)     All other components within normal limits  TROPONIN I - Abnormal; Notable for the following:    Troponin I 0.63 (*)     All other components within normal limits  PRO B NATRIURETIC PEPTIDE - Abnormal; Notable for the following:    Pro B Natriuretic peptide (BNP) 2056.0 (*)     All other components within normal limits  URINE MICROSCOPIC-ADD ON - Abnormal; Notable for the following:    Casts HYALINE CASTS (*)     Crystals CA OXALATE CRYSTALS (*)     All other components within normal limits  CBC WITH DIFFERENTIAL  LIPASE, BLOOD  BASIC METABOLIC PANEL  PROTIME-INR  APTT  MAGNESIUM  DIGOXIN LEVEL  TSH  CBC  CREATININE, SERUM  URINE RAPID DRUG SCREEN (HOSP PERFORMED)  LIPID PANEL  HEMOGLOBIN A1C  TROPONIN I   Ct Abdomen Pelvis Wo Contrast  06/22/2012  *RADIOLOGY REPORT*  Clinical Data: Bilateral flank pain.  Nausea and vomiting.  CT ABDOMEN AND PELVIS WITHOUT CONTRAST  Technique:  Multidetector CT imaging of the abdomen and pelvis was performed following the standard protocol without intravenous contrast.  Comparison: 06/22/2012 radiographs  Findings: Cardiomegaly is present along with mild mosaic attenuation in the lower lobes which may be from low-level edema or air trapping.  The visualized portion of the liver, spleen, pancreas, and adrenal glands appear unremarkable in noncontrast CT appearance.  The gallbladder and biliary system appear unremarkable.  The appendix  extends out to the inferior tip of the liver and appears normal.  The kidneys appear unremarkable, as do the proximal ureters.  No ureteral calculus observed small retroperitoneal lymph nodes are not pathologically enlarged by size criteria. No pathologic pelvic adenopathy is identified.  No ascites noted.  Urinary bladder appears normal.  Moderately fatty tissue noted along the spermatic cords.  IMPRESSION:  1.  No specific abnormality is identified to account the patient's bilateral flank pain. 2.  Chronic cardiomegaly. 3.  Faint mosaic attenuation in the lower lobes may be from resolving edema or air trapping.  Original Report Authenticated By: Dellia Cloud, M.D.   Dg Chest 1  View  06/22/2012  *RADIOLOGY REPORT*  Clinical Data: Chest pain and shortness of breath  CHEST - 1 VIEW  Comparison: 09/19/2011  Findings: The heart is again mildly enlarged but stable.  The lungs are well-aerated bilaterally without focal infiltrate.  No sizable effusion is seen.  The osseous structures are within normal limits.  IMPRESSION: No acute abnormality is noted.  Stable cardiomegaly.  Original Report Authenticated By: Phillips Odor, M.D.   Dg Abd 1 View  06/22/2012  *RADIOLOGY REPORT*  Clinical Data: Lower abdomen and right flank pain.  ABDOMEN - 1 VIEW  Comparison: None.  Findings: Normal bowel gas pattern.  No calcified urinary tract calculi are seen.  Mild bilateral iliac bone hyperostosis.  IMPRESSION: No acute abnormality.  Original Report Authenticated By: Darrol Angel, M.D.     1. Abdominal pain   2. Acute on chronic diastolic heart failure   3. NICM (nonischemic cardiomyopathy)       MDM  Nonspecific abdominal pain with CHF exacerbation. Pt needs admission for management and monitoring.        Flint Melter, MD 06/22/12 2137

## 2012-06-22 NOTE — H&P (Signed)
[facility] History and Physical  Douglas [name] ZOX:[id number] DOB: 1976/08/24 DOA: 06/22/2012  Referring physician: ED physician PCP: Pcp Not In System   Chief Complaint: Shortness of breath and abdominal pain  HPI:  This is a 36 year old African American male with a history of nonischemic cardiomyopathy presents with a one-day history of shortness of breath that can approximately 8 PM last night. He states that he was in his usual state of health until that period of time. However, the patient ran out of all his cardiac medications for the past week which have included spironolactone, digoxin, lisinopril, and carvedilol. He ran out of of Lasix approximately 2-3 days ago. The patient states that he did not have the money to get the medications at [facility]. Associated with shortness of breath, the patient began having violent dry coughing which resulted in some vomiting. However the vomiting has resolved in the emergency department and he actually wants to eat. The patient also complained of some bilateral flank pain. Interestingly, his abdominal pain and flank pain have almost 100% resolved after given furosemide 160 mg IV in the emergency department. 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 He denies any fevers, chills, chest pain, dysuria, hematuria, rashes, headaches, syncope, palpitations.  Of importance, the patient states that he last used cocaine approximately one year ago. He denies any intravenous drug use. He smokes approximately 8 cigarettes to one half pack per day x5 years.  Assessment and Plan: Acute on chronic diastolic heart failure- -echocardiogram on 12/29/2011 showed an ejection fraction of 55%. -His symptoms have significantly improved with 160 mg of intravenous furosemide in the emergency department. -Due to his drop in blood pressure from his diuresis, I will judiciously  reintroduce Coreg, lisinopril, spironolactone at lower doses. -TSH, hemoglobin A1c, digoxin level, and urine drug screen have been ordered Abdominal pain/flank pain -CT of the abdomen and pelvis was negative for acute pathology. I suspect that there is a degree of musculoskeletal etiology. Interestingly, pain has resolved with furosemide IV. He was not given any opioids in the emergency department Nonischemic cardiomyopathy Tobacco abuse Elevated troponin -I suspect that this is due to some cardiac strain/demand ischemia. I do not believe the patient is having an acute coronary syndrome. Code Status: Full Family Communication: Pt at bedside Disposition Plan: PT evaluation    Review of Systems:  Constitutional: Negative for fever, chills and malaise/fatigue. Negative for diaphoresis.  HENT: Negative for hearing loss, ear pain, nosebleeds, congestion, sore throat, neck pain, tinnitus and ear discharge.   Eyes: Negative for blurred vision, double vision, photophobia, pain, discharge and redness.  Respiratory: Negative for  sputum production,  wheezing and stridor.   Cardiovascular: Negative for chest pain, palpitations, orthopnea, claudication and leg swelling.  Gastrointestinal: Negative for nausea, vomiting.Negative for heartburn, constipation, blood in stool and melena.  Genitourinary: Negative for dysuria, urgency, frequency, hematuria and flank pain.  Musculoskeletal: Negative for myalgias, joint pain and falls.  Skin: Negative for itching and rash.  Neurological: Negative for dizziness and weakness. Negative for tingling, tremors, sensory change, speech change, focal weakness, loss of consciousness and headaches.  Endo/Heme/Allergies: Negative for environmental allergies and polydipsia. Does not bruise/bleed easily.  Psychiatric/Behavioral: Negative for suicidal ideas. The patient is not nervous/anxious.      Past Medical History  Diagnosis Date  . Hypertension   . Hyperlipidemia     . Systolic CHF, chronic   .  Obesity   . Nonischemic cardiomyopathy     a.  echo 4/06: EF 30%, mild to mod MR, mild RAE, inf HK, lat HK , ant HK;    b.  cath 4/06: no CAD, EF 20-25%  . ED (erectile dysfunction)   . NSVT (nonsustained ventricular tachycardia)     eval by EP in past; no ICD candidate due to NYHA 1 symptoms  . CHF (congestive heart failure)     History reviewed. No pertinent past surgical history.  Social History: Smokes 8 cigarettes per week 8 beers per week, last used cocaine one year ago  Allergies  Allergen Reactions  . Penicillins Other (See Comments)    Unknown     Family History  Problem Relation Age of Onset  . Coronary artery disease Mother 35    s/p PCI  . Lung cancer Father   . Diabetes type II Maternal Uncle   . Coronary artery disease Maternal Uncle     Prior to Admission medications   Medication Sig Start Date End Date Taking? Authorizing Provider  carvedilol (COREG) 12.5 MG tablet Take 2 tablets (25 mg total) by mouth 2 (two) times daily with a meal. 10/22/11 10/21/12 Yes Amy D Clegg, NP  digoxin (LANOXIN) 0.25 MG tablet Take 1 tablet (0.25 mg total) by mouth daily. 10/18/11 10/17/12 Yes Bevelyn Buckles Bensimhon, MD  furosemide (LASIX) 80 MG tablet Take 80 mg by mouth 2 (two) times daily. Take 1 tab every AM and 1 tab every PM  10/22/11  Yes Amy D Clegg, NP  lisinopril (PRINIVIL,ZESTRIL) 20 MG tablet Take 1 tablet (20 mg total) by mouth 2 (two) times daily. 10/18/11 10/17/12 Yes Bevelyn Buckles Bensimhon, MD  spironolactone (ALDACTONE) 25 MG tablet Take 1 tablet (25 mg total) by mouth daily. 10/18/11 10/17/12 Yes Dolores Patty, MD    Physical Exam: Filed Vitals:   06/22/12 1042 06/22/12 1311 06/22/12 1601  BP: 136/100 132/78 119/67  Pulse: 87 85 86  Temp: 98.7 F (37.1 C) 97.9 F (36.6 C) 98.3 F (36.8 C)  TempSrc: Oral Oral Oral  Resp: 18 20 15   SpO2: 99% 96% 97%    Physical Exam  Constitutional: Appears well-developed and well-nourished. No  distress.  HENT: Normocephalic. External right and left ear normal. Oropharynx is clear and moist.  Eyes: Conjunctivae and EOM are normal. PERRLA, no scleral icterus.  Neck: Normal ROM. Neck supple. No JVD. No tracheal deviation. No thyromegaly.  CVS: RRR, S1/S2 +, no murmurs, no gallops, no carotid bruit.  Pulmonary: Minimal basilar crackles without any wheezing. There is good air movement. Abdominal: Soft. BS +,  no distension, tenderness, rebound or guarding.  there is no costovertebral tenderness  Musculoskeletal: Normal range of motion. No edema and no tenderness.  Lymphadenopathy: No lymphadenopathy noted, cervical, inguinal.  Skin: Skin is warm and dry. No rash noted. Not diaphoretic. No erythema. No pallor.  Psychiatric: Normal mood and affect. Behavior, judgment, thought content normal.   Labs on Admission:  Basic Metabolic Panel:  Lab 06/22/12 4098  NA 143  K 4.0  CL 108  CO2 27  GLUCOSE 108*  BUN 10  CREATININE 1.08  CALCIUM 9.2  MG --  PHOS --   Liver Function Tests:  Lab 06/22/12 1058  AST 27  ALT 26  ALKPHOS 61  BILITOT 0.7  PROT 6.7  ALBUMIN 3.3*    Lab 06/22/12 1240  LIPASE 30  AMYLASE --   No results found for this basename: AMMONIA:5 in the last  168 hours CBC:  Lab 06/22/12 1058  WBC 7.4  NEUTROABS 5.0  HGB 14.3  HCT 41.1  MCV 86.5  PLT 191   Cardiac Enzymes:  Lab 06/22/12 1240  CKTOTAL --  CKMB --  CKMBINDEX --  TROPONINI 0.63*   BNP: No components found with this basename: POCBNP:5 CBG: No results found for this basename: GLUCAP:5 in the last 168 hours  Radiological Exams on Admission: Ct Abdomen Pelvis Wo Contrast  06/22/2012  *RADIOLOGY REPORT*  Clinical Data: Bilateral flank pain.  Nausea and vomiting.  CT ABDOMEN AND PELVIS WITHOUT CONTRAST  Technique:  Multidetector CT imaging of the abdomen and pelvis was performed following the standard protocol without intravenous contrast.  Comparison: 06/22/2012 radiographs  Findings:  Cardiomegaly is present along with mild mosaic attenuation in the lower lobes which may be from low-level edema or air trapping.  The visualized portion of the liver, spleen, pancreas, and adrenal glands appear unremarkable in noncontrast CT appearance.  The gallbladder and biliary system appear unremarkable.  The appendix extends out to the inferior tip of the liver and appears normal.  The kidneys appear unremarkable, as do the proximal ureters.  No ureteral calculus observed small retroperitoneal lymph nodes are not pathologically enlarged by size criteria. No pathologic pelvic adenopathy is identified.  No ascites noted.  Urinary bladder appears normal.  Moderately fatty tissue noted along the spermatic cords.  IMPRESSION:  1.  No specific abnormality is identified to account the patient's bilateral flank pain. 2.  Chronic cardiomegaly. 3.  Faint mosaic attenuation in the lower lobes may be from resolving edema or air trapping.  Original Report Authenticated By: Dellia Cloud, M.D.   Dg Chest 1 View  06/22/2012  *RADIOLOGY REPORT*  Clinical Data: Chest pain and shortness of breath  CHEST - 1 VIEW  Comparison: 09/19/2011  Findings: The heart is again mildly enlarged but stable.  The lungs are well-aerated bilaterally without focal infiltrate.  No sizable effusion is seen.  The osseous structures are within normal limits.  IMPRESSION: No acute abnormality is noted.  Stable cardiomegaly.  Original Report Authenticated By: Phillips Odor, M.D.   Dg Abd 1 View  06/22/2012  *RADIOLOGY REPORT*  Clinical Data: Lower abdomen and right flank pain.  ABDOMEN - 1 VIEW  Comparison: None.  Findings: Normal bowel gas pattern.  No calcified urinary tract calculi are seen.  Mild bilateral iliac bone hyperostosis.  IMPRESSION: No acute abnormality.  Original Report Authenticated By: Darrol Angel, M.D.    EKG: Normal sinus rhythm, no ST/T wave changes  Lessa Huge, MD  [facility] Pager  [phone]  If 7PM-7AM, please contact night-coverage [email or web address] Password [email or web address] 06/22/2012, 6:50 PM

## 2012-06-22 NOTE — Consult Note (Signed)
Advanced Heart Failure Team History and Physical Note   Primary Physician:  Primary Cardiologist: Dr. [name]  Reason for Admission:   Baseline proBNP: Weight Range:  HPI:    Douglas [name] is a 36 y.o. male with a h/o HTN, HL, obesity and recovered systolic HF due to NICM (EF 55% per echo 12/2011 up from 25-30%). Management has been complicated by severe non-compliance with medications and follow up.   He underwent LHC in 2006 showing normal coronaries.  Most recent CPX in August 2012 showed VO2 20.    09/20/11 Cath: Mild non-obstructive CAD. RHC RA = 17 RV = 66/14/20 PA = 73/34 (51) PCW = mean 32 V = 40-45 Fick cardiac output/index = 5.0/1.9 (using calculated O2 consumption) Fick cardiac output/index = 5.0/1.9 (using measured O2 consumption) PVR = 3.80 wwods SVR = 1487 FA sat = 90% PA sat = 53%,55% SVC sat = 54%  Has been doing well recently with no real HF symptoms. Ran out of meds several days ago. Today developed pain in lower central abdomen radiating up to R back. Also noted he had some dyspnea and a cough with activity. No edema, orthopnea or PND. No CP. Weight is stable.  Came to ER for eval. CXR was normal. BNP elevated at 2056. Trop 0.6. CMET OK. UA with calcium oxalate stones and ketones. No RBCs.   Review of Systems: [y] = yes, [ ]  = no   General: Weight gain [ ] ; Weight loss [ ] ; Anorexia [ ] ; Fatigue [ ] ; Fever [ ] ; Chills [ ] ; Weakness [ ]   Cardiac: Chest pain/pressure [ ] ; Resting SOB [ ] ; Exertional SOB [ ] ; Orthopnea [ ] ; Pedal Edema [ ] ; Palpitations [ ] ; Syncope [ ] ; Presyncope [ ] ; Paroxysmal nocturnal dyspnea[ ]   Pulmonary: Cough [ ] ; Wheezing[ ] ; Hemoptysis[ ] ; Sputum [ ] ; Snoring [ ]   GI: Vomiting[ ] ; Dysphagia[ ] ; Melena[ ] ; Hematochezia [ ] ; Heartburn[ ] ; Abdominal pain [ ] ; Constipation [ ] ; Diarrhea [ ] ; BRBPR [ ]   GU: Hematuria[ ] ; Dysuria [ ] ; Nocturia[ ]   Vascular: Pain in legs with walking [ ] ; Pain in feet with lying flat [ ] ; Non-healing sores [ ] ;  Stroke [ ] ; TIA [ ] ; Slurred speech [ ] ;  Neuro: Headaches[ ] ; Vertigo[ ] ; Seizures[ ] ; Paresthesias[ ] ;Blurred vision [ ] ; Diplopia [ ] ; Vision changes [ ]   Ortho/Skin: Arthritis [ ] ; Joint pain [ ] ; Muscle pain [ ] ; Joint swelling [ ] ; Back Pain [ ] ; Rash [ ]   Psych: Depression[ ] ; Anxiety[ ]   Heme: Bleeding problems [ ] ; Clotting disorders [ ] ; Anemia [ ]   Endocrine: Diabetes [ ] ; Thyroid dysfunction[ ]   Home Medications Prior to Admission medications   Medication Sig Start Date End Date Taking? Authorizing Provider  carvedilol (COREG) 12.5 MG tablet Take 2 tablets (25 mg total) by mouth 2 (two) times daily with a meal. 10/22/11 10/21/12 Yes Amy D Clegg, NP  digoxin (LANOXIN) 0.25 MG tablet Take 1 tablet (0.25 mg total) by mouth daily. 10/18/11 10/17/12 Yes Bevelyn Buckles Bensimhon, MD  furosemide (LASIX) 80 MG tablet Take 80 mg by mouth 2 (two) times daily. Take 1 tab every AM and 1 tab every PM  10/22/11  Yes Amy D Clegg, NP  lisinopril (PRINIVIL,ZESTRIL) 20 MG tablet Take 1 tablet (20 mg total) by mouth 2 (two) times daily. 10/18/11 10/17/12 Yes Bevelyn Buckles Bensimhon, MD  spironolactone (ALDACTONE) 25 MG tablet Take 1 tablet (25 mg total) by mouth daily. 10/18/11  10/17/12 Yes Dolores Patty, MD    Past Medical History: Past Medical History  Diagnosis Date  . Hypertension   . Hyperlipidemia   . Systolic CHF, chronic   . Obesity   . Nonischemic cardiomyopathy     a.  echo 4/06: EF 30%, mild to mod MR, mild RAE, inf HK, lat HK , ant HK;    b.  cath 4/06: no CAD, EF 20-25%  . ED (erectile dysfunction)   . NSVT (nonsustained ventricular tachycardia)     eval by EP in past; no ICD candidate due to NYHA 1 symptoms  . CHF (congestive heart failure)     Past Surgical History: History reviewed. No pertinent past surgical history.  Family History: Family History  Problem Relation Age of Onset  . Coronary artery disease Mother 75    s/p PCI  . Lung cancer Father   . Diabetes type II Maternal  Uncle   . Coronary artery disease Maternal Uncle     Social History: History   Social History  . Marital Status: Legally Separated    Spouse Name: N/A    Number of Children: 5  . Years of Education: N/A   Occupational History  . unemployed    Social History Main Topics  . Smoking status: Former Smoker -- 0.3 packs/day    Types: Cigarettes    Quit date: 09/17/2011  . Smokeless tobacco: Former Neurosurgeon    Quit date: 06/20/2011  . Alcohol Use: 0.5 oz/week    1 drink(s) per week  . Drug Use: No  . Sexually Active: None   Other Topics Concern  . None   Social History Narrative  . None    Allergies:  Allergies  Allergen Reactions  . Penicillins Other (See Comments)    Unknown     Objective:    Vital Signs:   Temp:  [97.9 F (36.6 C)-98.7 F (37.1 C)] 97.9 F (36.6 C) (08/08 1311) Pulse Rate:  [85-87] 85  (08/08 1311) Resp:  [18-20] 20  (08/08 1311) BP: (132-136)/(78-100) 132/78 mmHg (08/08 1311) SpO2:  [96 %-99 %] 96 % (08/08 1311)   There were no vitals filed for this visit.  Physical Exam: General:  Well appearing. No resp difficulty HEENT: normal Neck: supple. JVP hard to see appears about 6-7 . Carotids 2+ bilat; no bruits. No lymphadenopathy or thryomegaly appreciated. Cor: PMI nondisplaced. Regular rate & rhythm. Soft s4. No S3 Lungs: clear Abdomen: soft, nondistended. Mildly tender in RUQ. No hepatosplenomegaly. No bruits or masses. Good bowel sounds. Extremities: no cyanosis, clubbing, rash, trace edema Neuro: alert & orientedx3, cranial nerves grossly intact. moves all 4 extremities w/o difficulty. Affect pleasant  Telemetry: SR ECG  ECG SR 84. TWI I,L (old)  Labs: Basic Metabolic Panel:  Lab 06/22/12 4098  NA 143  K 4.0  CL 108  CO2 27  GLUCOSE 108*  BUN 10  CREATININE 1.08  CALCIUM 9.2  MG --  PHOS --    Liver Function Tests:  Lab 06/22/12 1058  AST 27  ALT 26  ALKPHOS 61  BILITOT 0.7  PROT 6.7  ALBUMIN 3.3*   No results  found for this basename: LIPASE:5,AMYLASE:5 in the last 168 hours No results found for this basename: AMMONIA:3 in the last 168 hours  CBC:  Lab 06/22/12 1058  WBC 7.4  NEUTROABS 5.0  HGB 14.3  HCT 41.1  MCV 86.5  PLT 191    Cardiac Enzymes:  Lab 06/22/12 1240  CKTOTAL --  CKMB --  CKMBINDEX --  TROPONINI 0.63*    BNP: BNP (last 3 results)  Basename 06/22/12 1240 09/24/11 0556 09/20/11 0610  PROBNP 2056.0* 490.2* 2096.0*    CBG: No results found for this basename: GLUCAP:5 in the last 168 hours  Coagulation Studies: No results found for this basename: LABPROT:5,INR:5 in the last 72 hours  Other results: Imaging: Dg Chest 1 View  06/22/2012  *RADIOLOGY REPORT*  Clinical Data: Chest pain and shortness of breath  CHEST - 1 VIEW  Comparison: 09/19/2011  Findings: The heart is again mildly enlarged but stable.  The lungs are well-aerated bilaterally without focal infiltrate.  No sizable effusion is seen.  The osseous structures are within normal limits.  IMPRESSION: No acute abnormality is noted.  Stable cardiomegaly.  Original Report Authenticated By: Phillips Odor, M.D.         Assessment:   1. Ab pain 2. Probable mild recurrent HF (?diastolic) 2. NICM, recovered per echo 12/2011  3. Hypertension 4. Nonobstructive CAD per cath 2006 5. History of med noncompliance and follow up 6. DM 7. Tobacco abuse 8. H/o kidney stones with calcium oxalate stones on UA  Plan/Discussion:    It is unclear to me what is exactly causing his ab pain. On exam he does not have significant volume overload but BNP and troponin are up suggesting at least mild heart strain. Thus, I guess it is possible that ab pain is related to liver capsular distension but he is not overly tender there on exam. Given ECG and results of cath do not think this represents cardiac ischemia. With quality of pain and UA findings, I am suspicious of possible kidney stone.   Would recommend ab CT to  evaluate for kidney stones and admit to IM for further evaluation. We will check echo and continue to diurese gently. We will follow closely.   Daniel Bensimhon,MD 3:15 PM          Robbi Garter, [facility] 06/22/2012, 1:50 PM

## 2012-06-22 NOTE — ED Notes (Signed)
Roselyn Bering, MD notified of abnormal lab test results and stated pt needs to be seen in a room ASAP; triage RN and EMT notified

## 2012-06-23 DIAGNOSIS — I059 Rheumatic mitral valve disease, unspecified: Secondary | ICD-10-CM

## 2012-06-23 DIAGNOSIS — F1490 Cocaine use, unspecified, uncomplicated: Secondary | ICD-10-CM

## 2012-06-23 DIAGNOSIS — I509 Heart failure, unspecified: Secondary | ICD-10-CM

## 2012-06-23 DIAGNOSIS — R109 Unspecified abdominal pain: Secondary | ICD-10-CM

## 2012-06-23 DIAGNOSIS — I5023 Acute on chronic systolic (congestive) heart failure: Secondary | ICD-10-CM

## 2012-06-23 DIAGNOSIS — F17200 Nicotine dependence, unspecified, uncomplicated: Secondary | ICD-10-CM

## 2012-06-23 LAB — BASIC METABOLIC PANEL
CO2: 25 mEq/L (ref 19–32)
Chloride: 107 mEq/L (ref 96–112)
GFR calc non Af Amer: 75 mL/min — ABNORMAL LOW (ref >90)
Glucose, Bld: 92 mg/dL (ref 70–99)
Potassium: 3.6 mEq/L (ref 3.5–5.1)
Sodium: 141 mEq/L (ref 135–145)

## 2012-06-23 LAB — TSH: TSH: 0.623 u[IU]/mL (ref 0.350–4.500)

## 2012-06-23 LAB — HEMOGLOBIN A1C: Hgb A1c MFr Bld: 6.1 % — ABNORMAL HIGH (ref <5.7)

## 2012-06-23 LAB — LIPID PANEL
LDL Cholesterol: 103 mg/dL — ABNORMAL HIGH (ref 0–99)
VLDL: 19 mg/dL (ref 0–40)

## 2012-06-23 MED ORDER — ASPIRIN 81 MG PO TBEC: 81.0000 mg | DELAYED_RELEASE_TABLET | Freq: Every day | ORAL | Status: DC

## 2012-06-23 MED ORDER — CARVEDILOL 3.125 MG PO TABS: 3.1250 mg | ORAL_TABLET | Freq: Two times a day (BID) | ORAL | Status: DC

## 2012-06-23 NOTE — Discharge Summary (Signed)
Physician Discharge Summary  Douglas [name] ION:[id number] DOB: 02-04-1976 DOA: 06/22/2012  PCP: Pcp Not In System  Admit date: 06/22/2012 Discharge date: 06/23/2012  Recommendations for Outpatient Follow-up:  1. Pt will need to follow up with PCP in 2-3 weeks post discharge 2. Please obtain BMP to evaluate electrolytes and kidney function 3. Please also check CBC to evaluate Hg and Hct levels  Discharge Diagnoses:  Acute on chronic diastolic heart failure- -echocardiogram on 12/29/2011 showed an ejection fraction of 55%. Echocardiogram that was ordered this admission is pending at this time. He will have the echocardiogram performed prior to discharge. -His symptoms have significantly improved with 160 mg of intravenous furosemide in the emergency department.  -Probably precipitated due to noncompliance and cocaine use Cocaine abuse -Although the patient denied recent usage, a urine drug screen was positive for cocaine -Upon further interview today, the patient admitted to cocaine use approximately 3-4 weeks ago Abdominal pain/flank pain  -CT of the abdomen and pelvis was negative for acute pathology. Nonischemic cardiomyopathy  Tobacco abuse  Elevated troponin  -I suspect that this is due to some cardiac strain/demand ischemia.  Active Problems:  Acute on chronic diastolic heart failure  Abdominal pain  Cocaine use    Discharge Condition: Stable  Diet recommendation: Heart healthy diet discussed in details   History of present illness:  This is a 36 year old male who presented with approximately 12 hours of worsening shortness of breath with abdominal and flank pain. The patient was treated with intravenous Lasix in the emergency department with significant improvement of his shortness of breath and subsequent abdominal and flank pain. He was admitted for acute exacerbation of CHF.  Hospital Course:  The patient was given 160 mg of intravenous furosemide in the emergency  department with significant improvement of his symptoms. The patient was a was switched over to oral furosemide and tolerated the regimen well. On the telemetry floor, the patient diuresed 4.7 L. The patient was restarted on a lower dose of carvedilol. He was also restarted on lisinopril, spironolactone. His vitals remained stable and the patient continued to improve clinically. His troponins remained in the indeterminate range. His EKG did not suggest any acute ischemia. On the day of discharge, the patient had a family medical emergency regarding his wife being admitted into [facility] for an ovarian cyst bleed; therefore, the patient requested to be discharged.   Procedures/Studies: Ct Abdomen Pelvis Wo Contrast  06/22/2012  *RADIOLOGY REPORT*  Clinical Data: Bilateral flank pain.  Nausea and vomiting.  CT ABDOMEN AND PELVIS WITHOUT CONTRAST  Technique:  Multidetector CT imaging of the abdomen and pelvis was performed following the standard protocol without intravenous contrast.  Comparison: 06/22/2012 radiographs  Findings: Cardiomegaly is present along with mild mosaic attenuation in the lower lobes which may be from low-level edema or air trapping.  The visualized portion of the liver, spleen, pancreas, and adrenal glands appear unremarkable in noncontrast CT appearance.  The gallbladder and biliary system appear unremarkable.  The appendix extends out to the inferior tip of the liver and appears normal.  The kidneys appear unremarkable, as do the proximal ureters.  No ureteral calculus observed small retroperitoneal lymph nodes are not pathologically enlarged by size criteria. No pathologic pelvic adenopathy is identified.  No ascites noted.  Urinary bladder appears normal.  Moderately fatty tissue noted along the spermatic cords.  IMPRESSION:  1.  No specific abnormality is identified to account the patient's bilateral flank pain. 2.  Chronic cardiomegaly.  3.  Faint mosaic attenuation  in the lower lobes may be from resolving edema or air trapping.  Original Report Authenticated By: Dellia Cloud, M.D.   Dg Chest 1 View  06/22/2012  *RADIOLOGY REPORT*  Clinical Data: Chest pain and shortness of breath  CHEST - 1 VIEW  Comparison: 09/19/2011  Findings: The heart is again mildly enlarged but stable.  The lungs are well-aerated bilaterally without focal infiltrate.  No sizable effusion is seen.  The osseous structures are within normal limits.  IMPRESSION: No acute abnormality is noted.  Stable cardiomegaly.  Original Report Authenticated By: Phillips Odor, M.D.   Dg Abd 1 View  06/22/2012  *RADIOLOGY REPORT*  Clinical Data: Lower abdomen and right flank pain.  ABDOMEN - 1 VIEW  Comparison: None.  Findings: Normal bowel gas pattern.  No calcified urinary tract calculi are seen.  Mild bilateral iliac bone hyperostosis.  IMPRESSION: No acute abnormality.  Original Report Authenticated By: Darrol Angel, M.D.   Consultations:  Cardiology- Dr. [name]     Discharge Exam: Filed Vitals:   06/23/12 1454  BP: 116/72  Pulse: 70  Temp: 98.9 F (37.2 C)  Resp: 20   Filed Vitals:   06/23/12 0229 06/23/12 0544 06/23/12 0657 06/23/12 1454  BP: 124/73 115/70 105/57 116/72  Pulse: 98 85 73 70  Temp: 100.5 F (38.1 C) 98.4 F (36.9 C) 98.4 F (36.9 C) 98.9 F (37.2 C)  TempSrc: Oral Oral Oral   Resp: 20 20 20 20   Height:      Weight:   126.5 kg (278 lb 14.1 oz)   SpO2: 93% 96% 97% 97%    General: Pt is alert, follows commands appropriately, not in acute distress HEENT: No icterus, rash, or cervical mass Cardiovascular: Regular rate and rhythm, S1/S2 +, no murmurs, no rubs, no gallops Respiratory: Clear to auscultation bilaterally, no wheezing, no crackles, no rhonchi Abdominal: Soft, non tender, non distended, bowel sounds +, no guarding Extremities: no edema, no cyanosis, pulses palpable bilaterally DP and PT Neuro: Grossly nonfocal  Discharge  Instructions  Discharge Orders    Future Appointments: Provider: Department: Dept Phone: Center:   06/27/2012 9:30 AM [facility] [phone] None     Future Orders Please Complete By Expires   Diet - low sodium heart healthy      Increase activity slowly        Medication List  As of 06/23/2012  3:52 PM   STOP taking these medications         digoxin 0.25 MG tablet         TAKE these medications         aspirin 81 MG EC tablet   Take 1 tablet (81 mg total) by mouth daily.      carvedilol 3.125 MG tablet   Commonly known as: COREG   Take 1 tablet (3.125 mg total) by mouth every 12 (twelve) hours.      furosemide 80 MG tablet   Commonly known as: LASIX   Take 80 mg by mouth 2 (two) times daily. Take 1 tab every AM and 1 tab every PM      lisinopril 20 MG tablet   Commonly known as: PRINIVIL,ZESTRIL   Take 1 tablet (20 mg total) by mouth 2 (two) times daily.      spironolactone 25 MG tablet   Commonly known as: ALDACTONE   Take 1 tablet (25 mg total) by mouth daily.  Follow-up Information    Follow up with [name], MD. (@ 09:30am; [facility] Code [phone]))    Contact information:   [address] [place] [address] [phone]           The results of significant diagnostics from this hospitalization (including imaging, microbiology, ancillary and laboratory) are listed below for reference.      Basic Metabolic Panel:  Lab 06/23/12 8657 06/22/12 2103 06/22/12 1058  NA 141 -- 143  K 3.6 -- 4.0  CL 107 -- 108  CO2 25 -- 27  GLUCOSE 92 -- 108*  BUN 12 -- 10  CREATININE 1.21 1.19 1.08  CALCIUM 8.7 -- 9.2  MG -- 1.6 --  PHOS -- -- --   Liver Function Tests:  Lab 06/22/12 1058  AST 27  ALT 26  ALKPHOS 61  BILITOT 0.7  PROT 6.7  ALBUMIN 3.3*    Lab 06/22/12 1240  LIPASE 30  AMYLASE --   No results found for this basename: AMMONIA:5 in the last 168 hours CBC:  Lab 06/22/12  2103 06/22/12 1058  WBC 10.1 7.4  NEUTROABS -- 5.0  HGB 14.7 14.3  HCT 41.6 41.1  MCV 86.3 86.5  PLT 199 191   Cardiac Enzymes:  Lab 06/22/12 2103 06/22/12 1240  CKTOTAL -- --  CKMB -- --  CKMBINDEX -- --  TROPONINI 0.66* 0.63*   BNP: BNP (last 3 results)  Basename 06/22/12 1240 09/24/11 0556 09/20/11 0610  PROBNP 2056.0* 490.2* 2096.0*   CBG: No results found for this basename: GLUCAP:5 in the last 168 hours   SIGNED: Time coordinating discharge: Over 30 minutes  Thelda Gagan, MD  [facility] 06/23/2012, 3:52 PM Pager [phone]  If 7PM-7AM, please contact night-coverage [email or web address] Password [email or web address]

## 2012-06-23 NOTE — Progress Notes (Signed)
Utilization review completed.  

## 2012-06-23 NOTE — Progress Notes (Signed)
  Echocardiogram 2D Echocardiogram has been performed.  Georgian Co 06/23/2012, 4:46 PM

## 2012-06-23 NOTE — Progress Notes (Signed)
Advanced Heart Failure Rounding Note   Subjective:    Douglas [name] is a 36 y.o. male with a h/o HTN, HL, obesity and recovered systolic HF due to NICM (EF 55% per echo 12/2011 up from 25-30%). Management has been complicated by severe non-compliance with medications and follow up. He underwent LHC in 2006 showing normal coronaries. Most recent CPX in August 2012 showed VO2 20.   09/20/11 Cath: Mild non-obstructive CAD. RHC RA = 17 RV = 66/14/20 PA = 73/34 (51) PCW = mean 32 V = 40-45 Fick cardiac output/index = 5.0/1.9 (using calculated O2 consumption) Fick cardiac output/index = 5.0/1.9 (using measured O2 consumption) PVR = 3.80 wwods SVR = 1487 FA sat = 90% PA sat = 53%,55% SVC sat = 54%  Over the past few months has been doing well with no HF symptoms. Several days ago ran out of medications. Admitted from ER yesterday for lower central abdominal pain radiating up to R back along with +dyspnea on exertion.  Reports abdominal pain improved. Denies any CP/orthopnea/SOB. Feels much better. Weight down 4 lbs and -3.5L.  Objective:   Weight Range:  Vital Signs:   Temp:  [97.9 F (36.6 C)-100.5 F (38.1 C)] 98.4 F (36.9 C) (08/09 0657) Pulse Rate:  [73-108] 73  (08/09 0657) Resp:  [15-25] 20  (08/09 0657) BP: (105-151)/(57-100) 105/57 mmHg (08/09 0657) SpO2:  [93 %-99 %] 97 % (08/09 0657) Weight:  [126.5 kg (278 lb 14.1 oz)-127.96 kg (282 lb 1.6 oz)] 126.5 kg (278 lb 14.1 oz) (08/09 0657) Last BM Date: 06/22/12  Weight change: Filed Weights   06/22/12 2002 06/23/12 0657  Weight: 127.96 kg (282 lb 1.6 oz) 126.5 kg (278 lb 14.1 oz)    Intake/Output:   Intake/Output Summary (Last 24 hours) at 06/23/12 0272 Last data filed at 06/23/12 0703  Gross per 24 hour  Intake    363 ml  Output   4225 ml  Net  -3862 ml     Physical Exam: General:  Patient laying in bed. Well appearing. No resp difficulty HEENT: normal Neck: supple. JVP 6-7 . Carotids 2+ bilat; no bruits. No  lymphadenopathy or thryomegaly appreciated. Cor: PMI nondisplaced. Regular rate & rhythm. No rubs or gallops. Soft s4. No S3 Lungs: clear Abdomen: soft, nontender, nondistended. No hepatosplenomegaly. No bruits or masses. Good bowel sounds. Extremities: no cyanosis, clubbing, rash, edema Neuro: alert & orientedx3, cranial nerves grossly intact. moves all 4 extremities w/o difficulty. Affect pleasant  Telemetry: SR  Labs: Basic Metabolic Panel:  Lab 06/23/12 5366 06/22/12 2103 06/22/12 1058  NA 141 -- 143  K 3.6 -- 4.0  CL 107 -- 108  CO2 25 -- 27  GLUCOSE 92 -- 108*  BUN 12 -- 10  CREATININE 1.21 1.19 1.08  CALCIUM 8.7 -- 9.2  MG -- 1.6 --  PHOS -- -- --    Liver Function Tests:  Lab 06/22/12 1058  AST 27  ALT 26  ALKPHOS 61  BILITOT 0.7  PROT 6.7  ALBUMIN 3.3*    Lab 06/22/12 1240  LIPASE 30  AMYLASE --   No results found for this basename: AMMONIA:3 in the last 168 hours  CBC:  Lab 06/22/12 2103 06/22/12 1058  WBC 10.1 7.4  NEUTROABS -- 5.0  HGB 14.7 14.3  HCT 41.6 41.1  MCV 86.3 86.5  PLT 199 191    Cardiac Enzymes:  Lab 06/22/12 2103 06/22/12 1240  CKTOTAL -- --  CKMB -- --  CKMBINDEX -- --  TROPONINI 0.66* 0.63*    BNP: BNP (last 3 results)  Basename 06/22/12 1240 09/24/11 0556 09/20/11 0610  PROBNP 2056.0* 490.2* 2096.0*     Other results:  EKG:   Imaging: Ct Abdomen Pelvis Wo Contrast  06/22/2012  *RADIOLOGY REPORT*  Clinical Data: Bilateral flank pain.  Nausea and vomiting.  CT ABDOMEN AND PELVIS WITHOUT CONTRAST  Technique:  Multidetector CT imaging of the abdomen and pelvis was performed following the standard protocol without intravenous contrast.  Comparison: 06/22/2012 radiographs  Findings: Cardiomegaly is present along with mild mosaic attenuation in the lower lobes which may be from low-level edema or air trapping.  The visualized portion of the liver, spleen, pancreas, and adrenal glands appear unremarkable in noncontrast CT  appearance.  The gallbladder and biliary system appear unremarkable.  The appendix extends out to the inferior tip of the liver and appears normal.  The kidneys appear unremarkable, as do the proximal ureters.  No ureteral calculus observed small retroperitoneal lymph nodes are not pathologically enlarged by size criteria. No pathologic pelvic adenopathy is identified.  No ascites noted.  Urinary bladder appears normal.  Moderately fatty tissue noted along the spermatic cords.  IMPRESSION:  1.  No specific abnormality is identified to account the patient's bilateral flank pain. 2.  Chronic cardiomegaly. 3.  Faint mosaic attenuation in the lower lobes may be from resolving edema or air trapping.  Original Report Authenticated By: Dellia Cloud, M.D.   Dg Chest 1 View  06/22/2012  *RADIOLOGY REPORT*  Clinical Data: Chest pain and shortness of breath  CHEST - 1 VIEW  Comparison: 09/19/2011  Findings: The heart is again mildly enlarged but stable.  The lungs are well-aerated bilaterally without focal infiltrate.  No sizable effusion is seen.  The osseous structures are within normal limits.  IMPRESSION: No acute abnormality is noted.  Stable cardiomegaly.  Original Report Authenticated By: Phillips Odor, M.D.   Dg Abd 1 View  06/22/2012  *RADIOLOGY REPORT*  Clinical Data: Lower abdomen and right flank pain.  ABDOMEN - 1 VIEW  Comparison: None.  Findings: Normal bowel gas pattern.  No calcified urinary tract calculi are seen.  Mild bilateral iliac bone hyperostosis.  IMPRESSION: No acute abnormality.  Original Report Authenticated By: Darrol Angel, M.D.      Medications:     Scheduled Medications:    . aspirin  324 mg Oral Once  . aspirin EC  81 mg Oral Daily  . carvedilol  3.125 mg Oral Q12H  . enoxaparin (LOVENOX) injection  40 mg Subcutaneous Q24H  . furosemide  160 mg Intravenous To ER  . furosemide  80 mg Oral Q12H  . lisinopril  20 mg Oral BID  . sodium chloride  3 mL Intravenous  Q12H  . spironolactone  25 mg Oral Daily     Infusions:     PRN Medications:  sodium chloride, acetaminophen, guaiFENesin-dextromethorphan, ondansetron (ZOFRAN) IV, sodium chloride   Assessment:   1. Ab pain  2. Probable mild recurrent HF (?diastolic)  2. NICM, recovered per echo 12/2011  3. Hypertension  4. Nonobstructive CAD per cath 2006  5. History of med noncompliance and follow up  6. DM  7. Tobacco abuse  8. H/o kidney stones with calcium oxalate stones on UA   Plan/Discussion:    Patient's abdominal pain improved and CT scan normal. Weight down 4lbs, will continue PO lasix and see how much more volume we can get off, however believe we are fairly close to  baseline. Will obtain ECHO today. Would recommend increasing BB to 6.25 mg daily and continue to monitor BP.    Length of Stay: 1 Aundria Rud 06/23/2012, 8:08 AM  Patient seen and examined with Ulla Potash, NP. We discussed all aspects of the encounter. I agree with the assessment and plan as stated above.   Much improved with diuresis. Will get echo today and then can d/c home after echo. Will resume meds. Discussed with pharmacy regarding our ability to provide him free 34-day supply of meds. Will arrange f/u appt in HF clinic next week.  Educated on need to be compliant with meds.  Appreciate [facility] assistance.   Andi Layfield,MD 9:03 AM

## 2012-06-27 ENCOUNTER — Encounter (HOSPITAL_COMMUNITY): Payer: Self-pay

## 2012-09-26 ENCOUNTER — Encounter (HOSPITAL_COMMUNITY): Payer: Self-pay | Admitting: *Deleted

## 2012-09-26 ENCOUNTER — Emergency Department (HOSPITAL_COMMUNITY)
Admission: EM | Admit: 2012-09-26 | Discharge: 2012-09-26 | Disposition: A | Discharge status: Home / Self Care | Payer: MEDICAID | Attending: Emergency Medicine | Admitting: Emergency Medicine

## 2012-09-26 ENCOUNTER — Emergency Department (HOSPITAL_COMMUNITY): Payer: MEDICAID

## 2012-09-26 DIAGNOSIS — I5022 Chronic systolic (congestive) heart failure: Secondary | ICD-10-CM | POA: Insufficient documentation

## 2012-09-26 DIAGNOSIS — I10 Essential (primary) hypertension: Secondary | ICD-10-CM | POA: Insufficient documentation

## 2012-09-26 DIAGNOSIS — R0602 Shortness of breath: Secondary | ICD-10-CM | POA: Insufficient documentation

## 2012-09-26 DIAGNOSIS — I509 Heart failure, unspecified: Secondary | ICD-10-CM

## 2012-09-26 DIAGNOSIS — J40 Bronchitis, not specified as acute or chronic: Secondary | ICD-10-CM | POA: Insufficient documentation

## 2012-09-26 DIAGNOSIS — Z9889 Other specified postprocedural states: Secondary | ICD-10-CM | POA: Insufficient documentation

## 2012-09-26 DIAGNOSIS — Z79899 Other long term (current) drug therapy: Secondary | ICD-10-CM | POA: Insufficient documentation

## 2012-09-26 DIAGNOSIS — F17200 Nicotine dependence, unspecified, uncomplicated: Secondary | ICD-10-CM | POA: Insufficient documentation

## 2012-09-26 DIAGNOSIS — Z7982 Long term (current) use of aspirin: Secondary | ICD-10-CM | POA: Insufficient documentation

## 2012-09-26 DIAGNOSIS — E669 Obesity, unspecified: Secondary | ICD-10-CM | POA: Insufficient documentation

## 2012-09-26 DIAGNOSIS — E785 Hyperlipidemia, unspecified: Secondary | ICD-10-CM | POA: Insufficient documentation

## 2012-09-26 LAB — BASIC METABOLIC PANEL
GFR calc Af Amer: >90 mL/min (ref >90)
GFR calc non Af Amer: 83 mL/min — ABNORMAL LOW (ref >90)
Potassium: 3.4 mEq/L — ABNORMAL LOW (ref 3.5–5.1)
Sodium: 139 mEq/L (ref 135–145)

## 2012-09-26 LAB — CBC WITH DIFFERENTIAL/PLATELET
Basophils Relative: 0 % (ref 0–1)
Hemoglobin: 14.6 g/dL (ref 13.0–17.0)
Lymphs Abs: 2 10*3/uL (ref 0.7–4.0)
Monocytes Relative: 10 % (ref 3–12)
Neutro Abs: 3.9 10*3/uL (ref 1.7–7.7)
Neutrophils Relative %: 58 % (ref 43–77)
RBC: 4.78 MIL/uL (ref 4.22–5.81)

## 2012-09-26 MED ORDER — PREDNISONE 20 MG PO TABS: 40.0000 mg | ORAL_TABLET | Freq: Every day | ORAL | Status: DC

## 2012-09-26 MED ORDER — ALBUTEROL SULFATE HFA 108 (90 BASE) MCG/ACT IN AERS
2.0000 | INHALATION_SPRAY | RESPIRATORY_TRACT | Status: DC | PRN
  Administered 2012-09-26: 2 via RESPIRATORY_TRACT
  Filled 2012-09-26: qty 6.7

## 2012-09-26 NOTE — ED Notes (Signed)
Pt demonstrated proper use of inhaler.

## 2012-09-26 NOTE — ED Notes (Signed)
Pt A.O. X 4. Ambulatory. Resp even and regular. NAD. Verbalized understanding of d/c instructions. No further questions at this time.

## 2012-09-26 NOTE — ED Notes (Signed)
Pt reports hxt of bronchititis.

## 2012-09-26 NOTE — ED Provider Notes (Signed)
History     CSN: [id number]  Arrival date & time 09/26/12  1448   First MD Initiated Contact with Patient 09/26/12 1938      Chief Complaint  Patient presents with  . Cough    (Consider location/radiation/quality/duration/timing/severity/associated sxs/prior treatment) HPI Comments: Patient with a history of CHF, Nonischemic cardiomyopathy, HTN, COPD, and Hyperlipidemia presents today with a chief complaint of productive cough and increasing shortness of breath.  He started feeling short of breath one week ago and symptoms have progressively worsened.He reports that he becomes short of breath when he walks up one flight of stairs.  He denies orthopnea or PND.  He reports that his cough has been present for the past month.   He denies LE swelling.  He does not check his weight regularly.  He denies CP.  He had a Cardiac Cath performed on 09/20/11, which showed mild non obstructive CAD.  Last Echo was performed 06/23/12, which showed Moderately dilated LV with severe systolic dysfunction, EF 25%. Global hypokinesis. Severe diastolic dysfunction.  He is a patient of Dr. Milas Kocher.    The history is provided by the patient.    Past Medical History  Diagnosis Date  . Hypertension   . Hyperlipidemia   . Systolic CHF, chronic   . Obesity   . Nonischemic cardiomyopathy     a.  echo 4/06: EF 30%, mild to mod MR, mild RAE, inf HK, lat HK , ant HK;    b.  cath 4/06: no CAD, EF 20-25%  . ED (erectile dysfunction)   . NSVT (nonsustained ventricular tachycardia)     eval by EP in past; no ICD candidate due to NYHA 1 symptoms  . CHF (congestive heart failure)   . Exertional dyspnea 06/22/2012    Past Surgical History  Procedure Date  . Cardiac catheterization 09/2011    Family History  Problem Relation Age of Onset  . Coronary artery disease Mother 43    s/p PCI  . Lung cancer Father   . Diabetes type II Maternal Uncle   . Coronary artery disease Maternal Uncle     History  Substance  Use Topics  . Smoking status: Current Every Day Smoker -- 0.3 packs/day for 4 years    Types: Cigarettes    Last Attempt to Quit: 06/21/2012  . Smokeless tobacco: Never Used  . Alcohol Use: 3.6 oz/week    6 Cans of beer per week     Comment: 06/22/2012 "maybe 6 pk beer/wk"      Review of Systems  Constitutional: Negative for chills.  Respiratory: Positive for cough and shortness of breath. Negative for chest tightness and wheezing.   Cardiovascular: Negative for chest pain and leg swelling.  Gastrointestinal: Negative for nausea and vomiting.  Neurological: Negative for dizziness, syncope and light-headedness.  All other systems reviewed and are negative.    Allergies  Penicillins  Home Medications   Current Outpatient Rx  Name  Route  Sig  Dispense  Refill  . ASPIRIN 81 MG PO TBEC   Oral   Take 1 tablet (81 mg total) by mouth daily.         Marland Kitchen CARVEDILOL 3.125 MG PO TABS   Oral   Take 3.125 mg by mouth 2 (two) times daily with a meal.         . FUROSEMIDE 80 MG PO TABS   Oral   Take 80 mg by mouth 2 (two) times daily. Take 1 tab every AM and 1  tab every PM          . LISINOPRIL 20 MG PO TABS   Oral   Take 1 tablet (20 mg total) by mouth 2 (two) times daily.   60 tablet   6   . SPIRONOLACTONE 25 MG PO TABS   Oral   Take 1 tablet (25 mg total) by mouth daily.   30 tablet   6     BP 151/129  Pulse 94  Temp 98.3 F (36.8 C) (Oral)  Resp 18  SpO2 99%  Physical Exam  Nursing note and vitals reviewed. Constitutional: He appears well-developed and well-nourished. No distress.  HENT:  Head: Normocephalic and atraumatic.  Mouth/Throat: Oropharynx is clear and moist.  Neck: Normal range of motion. Neck supple.  Cardiovascular: Normal rate, regular rhythm and normal heart sounds.   Pulmonary/Chest: Effort normal and breath sounds normal. No respiratory distress. He has no wheezes. He has no rales.  Abdominal: Soft. He exhibits no distension and no mass.  There is no tenderness. There is no rebound and no guarding.  Musculoskeletal:       Trace pitting edema of lower extremities bilaterally from the mid shin distally  Neurological: He is alert.  Skin: Skin is warm and dry. He is not diaphoretic.  Psychiatric: He has a normal mood and affect.    ED Course  Procedures (including critical care time)  Labs Reviewed  PRO B NATRIURETIC PEPTIDE - Abnormal; Notable for the following:    Pro B Natriuretic peptide (BNP) 1222.0 (*)     All other components within normal limits  BASIC METABOLIC PANEL - Abnormal; Notable for the following:    Potassium 3.4 (*)     Glucose, Bld 183 (*)     GFR calc non Af Amer 83 (*)     All other components within normal limits  CBC WITH DIFFERENTIAL   Dg Chest 2 View  09/26/2012  *RADIOLOGY REPORT*  Clinical Data: Cough, congestion  CHEST - 2 VIEW  Comparison: 06/22/2012  Findings: Cardiomegaly again noted.  No acute infiltrate or pleural effusion.  Central mild bronchitic changes.  Bony thorax is stable.  IMPRESSION: No acute infiltrate or pleural effusion.  Central mild bronchitic changes.   Original Report Authenticated By: Natasha Mead, M.D.      No diagnosis found.   Date: 09/27/2012  Rate: 88  Rhythm: normal sinus rhythm  QRS Axis: normal  Intervals: normal  ST/T Wave abnormalities: nonspecific T wave changes  Conduction Disutrbances:none  Narrative Interpretation:   Old EKG Reviewed: unchanged    9:57 PM Patient reports that his symptoms have improved after given the Albuterol inhaler.  Patient ambulated and maintained pulse ox 95 on RA.  MDM  Feel that symptoms of DOE and cough are probably related to a combination of Bronchitis and CHF exacerbation.  Patient looks well.  Pulse ox 95-98 on RA.  CXR shows mild bronchitic changes.  No pulmonary edema or pleural effusion.   BNP elevated at 1222.  Patient instructed to double up on his morning dose of Lasix for the next 3 days and take his regular  dose in the evening.  He has also been instructed to follow up with his Cardiologist Dr. Milas Kocher.  Return precautions discussed with the patient.          Pascal Lux Holland, PA-C 09/27/12 2326

## 2012-09-26 NOTE — ED Notes (Signed)
Pt has been coughing for one week and states phelgm that is brown in color.  Pt reports sob with laying down at nite and worse at nite.  Pt reports sob on exertion.  Pt did have lower back pain the other nite.  No chest pain

## 2012-09-26 NOTE — ED Notes (Signed)
Per NT patient remained above 95% SpO2 while ambulating.

## 2012-09-28 NOTE — ED Provider Notes (Signed)
Medical screening examination/treatment/procedure(s) were performed by non-physician practitioner and as supervising physician I was immediately available for consultation/collaboration.   [name], MD 09/28/12 [phone]

## 2012-10-05 ENCOUNTER — Other Ambulatory Visit (HOSPITAL_COMMUNITY): Payer: Self-pay | Admitting: Internal Medicine

## 2012-10-20 ENCOUNTER — Other Ambulatory Visit (HOSPITAL_COMMUNITY): Payer: Self-pay | Admitting: *Deleted

## 2012-10-20 MED ORDER — FUROSEMIDE 80 MG PO TABS: 80.0000 mg | ORAL_TABLET | Freq: Two times a day (BID) | ORAL | Status: DC

## 2012-10-26 ENCOUNTER — Other Ambulatory Visit (HOSPITAL_COMMUNITY): Payer: Self-pay | Admitting: *Deleted

## 2012-10-26 MED ORDER — FUROSEMIDE 80 MG PO TABS: 80.0000 mg | ORAL_TABLET | Freq: Two times a day (BID) | ORAL | Status: DC

## 2013-01-20 ENCOUNTER — Inpatient Hospital Stay (HOSPITAL_COMMUNITY)
Admission: EM | Admit: 2013-01-20 | Discharge: 2013-01-26 | DRG: 988 | Disposition: A | Discharge status: Home / Self Care | Payer: Medicaid Other | Attending: Cardiology | Admitting: Cardiology

## 2013-01-20 ENCOUNTER — Emergency Department (HOSPITAL_COMMUNITY): Payer: Medicaid Other

## 2013-01-20 ENCOUNTER — Encounter (HOSPITAL_COMMUNITY): Payer: Self-pay

## 2013-01-20 DIAGNOSIS — F17200 Nicotine dependence, unspecified, uncomplicated: Secondary | ICD-10-CM | POA: Diagnosis present

## 2013-01-20 DIAGNOSIS — N179 Acute kidney failure, unspecified: Secondary | ICD-10-CM | POA: Diagnosis present

## 2013-01-20 DIAGNOSIS — K045 Chronic apical periodontitis: Secondary | ICD-10-CM | POA: Diagnosis present

## 2013-01-20 DIAGNOSIS — I472 Ventricular tachycardia, unspecified: Secondary | ICD-10-CM | POA: Diagnosis present

## 2013-01-20 DIAGNOSIS — I4729 Other ventricular tachycardia: Secondary | ICD-10-CM | POA: Diagnosis present

## 2013-01-20 DIAGNOSIS — I509 Heart failure, unspecified: Secondary | ICD-10-CM | POA: Diagnosis present

## 2013-01-20 DIAGNOSIS — K0401 Reversible pulpitis: Secondary | ICD-10-CM | POA: Diagnosis present

## 2013-01-20 DIAGNOSIS — I059 Rheumatic mitral valve disease, unspecified: Secondary | ICD-10-CM | POA: Diagnosis present

## 2013-01-20 DIAGNOSIS — K047 Periapical abscess without sinus: Secondary | ICD-10-CM | POA: Diagnosis present

## 2013-01-20 DIAGNOSIS — E876 Hypokalemia: Secondary | ICD-10-CM | POA: Diagnosis not present

## 2013-01-20 DIAGNOSIS — Z7982 Long term (current) use of aspirin: Secondary | ICD-10-CM

## 2013-01-20 DIAGNOSIS — I5023 Acute on chronic systolic (congestive) heart failure: Principal | ICD-10-CM | POA: Diagnosis present

## 2013-01-20 DIAGNOSIS — E669 Obesity, unspecified: Secondary | ICD-10-CM | POA: Diagnosis present

## 2013-01-20 DIAGNOSIS — I129 Hypertensive chronic kidney disease with stage 1 through stage 4 chronic kidney disease, or unspecified chronic kidney disease: Secondary | ICD-10-CM | POA: Diagnosis present

## 2013-01-20 DIAGNOSIS — Z91199 Patient's noncompliance with other medical treatment and regimen due to unspecified reason: Secondary | ICD-10-CM

## 2013-01-20 DIAGNOSIS — Z79899 Other long term (current) drug therapy: Secondary | ICD-10-CM

## 2013-01-20 DIAGNOSIS — N189 Chronic kidney disease, unspecified: Secondary | ICD-10-CM | POA: Diagnosis present

## 2013-01-20 DIAGNOSIS — I428 Other cardiomyopathies: Secondary | ICD-10-CM | POA: Diagnosis present

## 2013-01-20 DIAGNOSIS — E785 Hyperlipidemia, unspecified: Secondary | ICD-10-CM | POA: Diagnosis present

## 2013-01-20 LAB — URINALYSIS, MICROSCOPIC ONLY
Nitrite: NEGATIVE
Protein, ur: >300 mg/dL — AB
Specific Gravity, Urine: 1.028 (ref 1.005–1.030)
Urobilinogen, UA: 1 mg/dL (ref 0.0–1.0)

## 2013-01-20 LAB — CBC
HCT: 39.1 % (ref 39.0–52.0)
MCH: 30.4 pg (ref 26.0–34.0)
MCV: 86.1 fL (ref 78.0–100.0)
Platelets: 289 10*3/uL (ref 150–400)
RBC: 4.54 MIL/uL (ref 4.22–5.81)
WBC: 10 10*3/uL (ref 4.0–10.5)

## 2013-01-20 LAB — POCT I-STAT TROPONIN I

## 2013-01-20 LAB — POCT I-STAT, CHEM 8
BUN: 21 mg/dL (ref 6–23)
Calcium, Ion: 1.11 mmol/L — ABNORMAL LOW (ref 1.12–1.23)
Chloride: 101 mEq/L (ref 96–112)
Creatinine, Ser: 1.2 mg/dL (ref 0.50–1.35)
Glucose, Bld: 195 mg/dL — ABNORMAL HIGH (ref 70–99)
HCT: 42 % (ref 39.0–52.0)
Potassium: 3.1 mEq/L — ABNORMAL LOW (ref 3.5–5.1)

## 2013-01-20 MED ORDER — SODIUM CHLORIDE 0.9 % IJ SOLN: 3.0000 mL | INTRAMUSCULAR | Status: DC | PRN

## 2013-01-20 MED ORDER — ALBUTEROL SULFATE HFA 108 (90 BASE) MCG/ACT IN AERS
2.0000 | INHALATION_SPRAY | Freq: Four times a day (QID) | RESPIRATORY_TRACT | Status: DC | PRN
  Administered 2013-01-21: 2 via RESPIRATORY_TRACT
  Filled 2013-01-20: qty 6.7

## 2013-01-20 MED ORDER — HEPARIN SODIUM (PORCINE) 5000 UNIT/ML IJ SOLN
5000.0000 [IU] | Freq: Three times a day (TID) | INTRAMUSCULAR | Status: AC
  Administered 2013-01-20 – 2013-01-25 (×17): 5000 [IU] via SUBCUTANEOUS
  Filled 2013-01-20 (×19): qty 1

## 2013-01-20 MED ORDER — POTASSIUM CHLORIDE CRYS ER 20 MEQ PO TBCR
40.0000 meq | EXTENDED_RELEASE_TABLET | Freq: Once | ORAL | Status: AC
  Administered 2013-01-20: 40 meq via ORAL
  Filled 2013-01-20: qty 2

## 2013-01-20 MED ORDER — SODIUM CHLORIDE 0.9 % IV SOLN: 250.0000 mL | INTRAVENOUS | Status: DC | PRN

## 2013-01-20 MED ORDER — ASPIRIN EC 81 MG PO TBEC: 81.0000 mg | DELAYED_RELEASE_TABLET | Freq: Every day | ORAL | Status: DC

## 2013-01-20 MED ORDER — FUROSEMIDE 10 MG/ML IJ SOLN
80.0000 mg | Freq: Two times a day (BID) | INTRAMUSCULAR | Status: DC
  Administered 2013-01-20 – 2013-01-23 (×6): 80 mg via INTRAVENOUS
  Filled 2013-01-20 (×8): qty 8

## 2013-01-20 MED ORDER — POTASSIUM CHLORIDE CRYS ER 20 MEQ PO TBCR
20.0000 meq | EXTENDED_RELEASE_TABLET | Freq: Two times a day (BID) | ORAL | Status: DC
  Administered 2013-01-20 – 2013-01-22 (×6): 20 meq via ORAL
  Filled 2013-01-20 (×5): qty 1
  Filled 2013-01-20: qty 2
  Filled 2013-01-20 (×2): qty 1

## 2013-01-20 MED ORDER — ASPIRIN EC 81 MG PO TBEC
81.0000 mg | DELAYED_RELEASE_TABLET | Freq: Every day | ORAL | Status: DC
  Administered 2013-01-20 – 2013-01-25 (×6): 81 mg via ORAL
  Filled 2013-01-20 (×7): qty 1

## 2013-01-20 MED ORDER — CARVEDILOL 6.25 MG PO TABS
6.2500 mg | ORAL_TABLET | Freq: Two times a day (BID) | ORAL | Status: DC
  Administered 2013-01-20 – 2013-01-21 (×2): 6.25 mg via ORAL
  Filled 2013-01-20 (×4): qty 1

## 2013-01-20 MED ORDER — CARVEDILOL 12.5 MG PO TABS
12.5000 mg | ORAL_TABLET | Freq: Once | ORAL | Status: AC
  Administered 2013-01-20: 12.5 mg via ORAL
  Filled 2013-01-20: qty 1

## 2013-01-20 MED ORDER — ONDANSETRON HCL 4 MG/2ML IJ SOLN: 4.0000 mg | Freq: Four times a day (QID) | INTRAMUSCULAR | Status: DC | PRN

## 2013-01-20 MED ORDER — ACETAMINOPHEN 325 MG PO TABS
650.0000 mg | ORAL_TABLET | ORAL | Status: DC | PRN
  Administered 2013-01-20 – 2013-01-23 (×9): 650 mg via ORAL
  Filled 2013-01-20 (×12): qty 2

## 2013-01-20 MED ORDER — ACETAMINOPHEN 325 MG PO TABS
650.0000 mg | ORAL_TABLET | Freq: Four times a day (QID) | ORAL | Status: DC | PRN
  Administered 2013-01-20 – 2013-01-22 (×6): 650 mg via ORAL
  Filled 2013-01-20 (×3): qty 2

## 2013-01-20 MED ORDER — LISINOPRIL 20 MG PO TABS
20.0000 mg | ORAL_TABLET | Freq: Every day | ORAL | Status: DC
  Administered 2013-01-20: 20 mg via ORAL
  Filled 2013-01-20 (×2): qty 1

## 2013-01-20 MED ORDER — SODIUM CHLORIDE 0.9 % IJ SOLN
3.0000 mL | Freq: Two times a day (BID) | INTRAMUSCULAR | Status: DC
  Administered 2013-01-20 – 2013-01-25 (×12): 3 mL via INTRAVENOUS

## 2013-01-20 NOTE — ED Notes (Signed)
Pt c/o of abd pain around umbilicus area. Pt rates pain 5/10. Pt states pain gets worse when he coughs. Pt also states he is having difficulty sleeping due to sob.

## 2013-01-20 NOTE — H&P (Signed)
HPI: 37 yo male with acute on chronic systolic CHF; h/o HTN, HL, obesity and severe non-compliance.  Underwent cath in 2006 which showed normal cors with EF 20-25%. 09/20/11 Cath: Mild non-obstructive CAD. RHC RA = 17 RV = 66/14/20 PA = 73/34 (51) PCW = mean 32 V = 40-45 Fick cardiac output/index = 5.0/1.9 (using calculated O2 consumption) Fick cardiac output/index = 5.0/1.9 (using measured O2 consumption) PVR = 3.80 wwods SVR = 1487 FA sat = 90% PA sat = 53%,55% SVC sat = 54%, EF 5-10%. Echocardiogram in February of 2013 showed that his ejection fraction had improved to 55%. However followup echocardiogram in August of 2013 showed an ejection fraction of 25%; moderate to severe mitral regurgitation, moderate left atrial enlargement and right side enlargement. The patient has been taking Lasix but ran out of his Coreg. He also has only intermittently taken his lisinopril. He presents with complaints of progressive dyspnea on exertion, orthopnea, PND and pedal edema. Some chest pain with cough. Some abdominal fullness.     (Not in a hospital admission)  Allergies  Allergen Reactions  . Penicillins Other (See Comments)    Unknown childhood reaction     Past Medical History  Diagnosis Date  . Hypertension   . Hyperlipidemia   . Systolic CHF, chronic   . Obesity   . Nonischemic cardiomyopathy     a.  echo 4/06: EF 30%, mild to mod MR, mild RAE, inf HK, lat HK , ant HK;    b.  cath 4/06: no CAD, EF 20-25%  . ED (erectile dysfunction)   . NSVT (nonsustained ventricular tachycardia)     eval by EP in past; no ICD candidate due to NYHA 1 symptoms    Past Surgical History  Procedure Laterality Date  . Cardiac catheterization  09/2011    History   Social History  . Marital Status: Legally Separated    Spouse Name: N/A    Number of Children: 5  . Years of Education: N/A   Occupational History  . unemployed    Social History Main Topics  . Smoking status: Current Every Day Smoker --  0.30 packs/day for 4 years    Types: Cigarettes    Last Attempt to Quit: 06/21/2012  . Smokeless tobacco: Never Used  . Alcohol Use: 3.6 oz/week    6 Cans of beer per week     Comment: 06/22/2012 "maybe 6 pk beer/wk"  . Drug Use: Yes    Special: Cocaine     Comment: 06/22/2012 "last cocaine ~ 6 months ago"  Last use of cocaine in October 2013  . Sexually Active: Yes   Other Topics Concern  . Not on file   Social History Narrative  . No narrative on file    Family History  Problem Relation Age of Onset  . Coronary artery disease Mother 46    s/p PCI  . Lung cancer Father   . Diabetes type II Maternal Uncle   . Coronary artery disease Maternal Uncle     ROS:  no fevers or chills, productive cough, hemoptysis, dysphasia, odynophagia, melena, hematochezia, dysuria, hematuria, rash, seizure activity, orthopnea, PND, pedal edema, claudication. Remaining systems are negative.  Physical Exam:   Blood pressure 142/98, pulse 108, temperature 97.3 F (36.3 C), temperature source Oral, resp. rate 15, SpO2 96.00%.  General:  Well developed/well nourished in NAD Skin warm/dry Patient not depressed No peripheral clubbing Back-normal HEENT-normal/normal eyelids Neck supple/normal carotid upstroke bilaterally; no bruits; no JVD; no thyromegaly  chest - CTA/ normal expansion CV - RRR/normal S1 and S2; S3 noted, 2/6 systolic murmur apex. Abdomen -NT/ND, no HSM, no mass, + bowel sounds, no bruit 2+ femoral pulses, no bruits Ext-1+ edema, no chords, 2+ DP Neuro-grossly nonfocal  ECG sinus tachycardia, biatrial enlargement and nonspecific ST changes.  Results for orders placed during the hospital encounter of 01/20/13 (from the past 48 hour(s))  CBC     Status: None   Collection Time    01/20/13  7:49 AM      Result Value Range   WBC 10.0  4.0 - 10.5 K/uL   RBC 4.54  4.22 - 5.81 MIL/uL   Hemoglobin 13.8  13.0 - 17.0 g/dL   HCT 21.3  08.6 - 57.8 %   MCV 86.1  78.0 - 100.0 fL   MCH  30.4  26.0 - 34.0 pg   MCHC 35.3  30.0 - 36.0 g/dL   RDW 46.9  62.9 - 52.8 %   Platelets 289  150 - 400 K/uL  PRO B NATRIURETIC PEPTIDE     Status: Abnormal   Collection Time    01/20/13  7:50 AM      Result Value Range   Pro B Natriuretic peptide (BNP) 3196.0 (*) 0 - 125 pg/mL  POCT I-STAT TROPONIN I     Status: Abnormal   Collection Time    01/20/13  8:18 AM      Result Value Range   Troponin i, poc 0.25 (*) 0.00 - 0.08 ng/mL   Comment NOTIFIED PHYSICIAN     Comment 3            Comment: Due to the release kinetics of cTnI,     a negative result within the first hours     of the onset of symptoms does not rule out     myocardial infarction with certainty.     If myocardial infarction is still suspected,     repeat the test at appropriate intervals.  POCT I-STAT, CHEM 8     Status: Abnormal   Collection Time    01/20/13  8:19 AM      Result Value Range   Sodium 139  135 - 145 mEq/L   Potassium 3.1 (*) 3.5 - 5.1 mEq/L   Chloride 101  96 - 112 mEq/L   BUN 21  6 - 23 mg/dL   Creatinine, Ser 4.13  0.50 - 1.35 mg/dL   Glucose, Bld 244 (*) 70 - 99 mg/dL   Calcium, Ion 0.10 (*) 1.12 - 1.23 mmol/L   TCO2 27  0 - 100 mmol/L   Hemoglobin 14.3  13.0 - 17.0 g/dL   HCT 27.2  53.6 - 64.4 %    Dg Chest Port 1 View  01/20/2013  *RADIOLOGY REPORT*  Clinical Data: Shortness of breath.  Chest pain.  PORTABLE CHEST - 1 VIEW  Comparison: Chest x-ray 09/26/2012.  Findings: Lung volumes are low.  Film is underpenetrated limiting the sensitivity and specificity of the examination.  With this limitation in mind there is no definite consolidative airspace disease or pleural effusions.  Pulmonary venous congestion, without frank pulmonary edema.  Mild cardiomegaly which appears to have increased compared to the prior study.  Upper mediastinal contours are unremarkable.  IMPRESSION: 1.  Cardiomegaly with pulmonary venous congestion, but no frank pulmonary edema.   Original Report Authenticated By: Trudie Reed, M.D.     Assessment/Plan 1 acute on chronic systolic congestive heart failure-the patient has been noncompliant  with his medical regimen and his blood pressure is elevated. Plan Lasix 80 mg IV twice a day. Resume ACE inhibitor. Add low-dose carvedilol increase as needed. Patient instructed on the importance of compliance. Repeat echocardiogram. 2 nonischemic cardiomyopathy-titrate ACE inhibitor and beta blocker as tolerated. I do not think he is a candidate for ICD given his history of noncompliance. 3 tobacco abuse-patient counseled on discontinuing. 4 hypertension-reinstitute blood pressure medications. 5 elevated troponin-most likely from congestive heart failure. Continue to cycle enzymes. If no clear trend up no plans for further ischemia evaluation. Previous catheterization showed nonobstructive coronary disease. 6 hypokalemia-supplement  Olga Millers MD 01/20/2013, 9:35 AM

## 2013-01-20 NOTE — Progress Notes (Signed)
  Echocardiogram 2D Echocardiogram has been performed.  MORFORD, MELISSA 01/20/2013, 2:56 PM

## 2013-01-20 NOTE — Progress Notes (Signed)
Client had foley on arrival to floor, urine specimen sent and foley discontinued.  Client is ambulatory and able to void without assistance of foley.  Echo completed and meal given to patient.

## 2013-01-20 NOTE — ED Notes (Signed)
Istat Troponin reported to [place], Charity fundraiser

## 2013-01-20 NOTE — ED Provider Notes (Addendum)
History     CSN: [id number]  Arrival date & time 01/20/13  0710   First MD Initiated Contact with Patient 01/20/13 [phone]      Chief Complaint  Patient presents with  . Shortness of Breath  . Dental Pain    (Consider location/radiation/quality/duration/timing/severity/associated sxs/prior treatment) HPI Complains of shortness of breath with exertion for the past 4 days. Patient states he ran out of carvedelol one month ago. Also admits to slight cough denies fever denies chest pain. No other complaint other than toothache for the past 5 days at left lower molar where he had a crown placed 10 years ago. No treatment prior to coming here. Patient is worse with exertion and lying flat improved with standing toothache is constant mild to moderate, nonradiating Past Medical History  Diagnosis Date  . Hypertension   . Hyperlipidemia   . Systolic CHF, chronic   . Obesity   . Nonischemic cardiomyopathy     a.  echo 4/06: EF 30%, mild to mod MR, mild RAE, inf HK, lat HK , ant HK;    b.  cath 4/06: no CAD, EF 20-25%  . ED (erectile dysfunction)   . NSVT (nonsustained ventricular tachycardia)     eval by EP in past; no ICD candidate due to NYHA 1 symptoms  . CHF (congestive heart failure)   . Exertional dyspnea 06/22/2012    Past Surgical History  Procedure Laterality Date  . Cardiac catheterization  09/2011    Family History  Problem Relation Age of Onset  . Coronary artery disease Mother 67    s/p PCI  . Lung cancer Father   . Diabetes type II Maternal Uncle   . Coronary artery disease Maternal Uncle     History  Substance Use Topics  . Smoking status: Current Every Day Smoker -- 0.30 packs/day for 4 years    Types: Cigarettes    Last Attempt to Quit: 06/21/2012  . Smokeless tobacco: Never Used  . Alcohol Use: 3.6 oz/week    6 Cans of beer per week     Comment: 06/22/2012 "maybe 6 pk beer/wk"   admits to cocaine 2 weeks ago. Denies IV drug use    Review of Systems  HENT:  Positive for dental problem.   Respiratory: Positive for shortness of breath.   Cardiovascular: Positive for leg swelling.       Bilateral leg edema which he states has been improved over the past 2 months  All other systems reviewed and are negative.    Allergies  Penicillins  Home Medications   Current Outpatient Rx  Name  Route  Sig  Dispense  Refill  . albuterol (PROVENTIL HFA;VENTOLIN HFA) 108 (90 BASE) MCG/ACT inhaler   Inhalation   Inhale 2 puffs into the lungs every 6 (six) hours as needed for wheezing.         Marland Kitchen aspirin EC 81 MG EC tablet   Oral   Take 1 tablet (81 mg total) by mouth daily.         Marland Kitchen dextromethorphan-guaiFENesin (MUCINEX DM) 30-600 MG per 12 hr tablet   Oral   Take 1 tablet by mouth every 12 (twelve) hours as needed (cough and congestion).         . furosemide (LASIX) 80 MG tablet   Oral   Take 1 tablet (80 mg total) by mouth 2 (two) times daily.   60 tablet   1     MUST MAKE FOLLOW UP APPOINTMENT FOR FURTHER  REFILL .Marland Kitchen.   . Naproxen Sodium (ALEVE PO)   Oral   Take 1-2 tablets by mouth every 12 (twelve) hours as needed (pain).         . EXPIRED: lisinopril (PRINIVIL,ZESTRIL) 20 MG tablet   Oral   Take 1 tablet (20 mg total) by mouth 2 (two) times daily.   60 tablet   6   . predniSONE (DELTASONE) 20 MG tablet   Oral   Take 2 tablets (40 mg total) by mouth daily.   10 tablet   0   . EXPIRED: spironolactone (ALDACTONE) 25 MG tablet   Oral   Take 1 tablet (25 mg total) by mouth daily.   30 tablet   6   . VIAGRA 100 MG tablet      TAKE ONE TABLET BY MOUTH EVERY DAY AS NEEDED FOR ERECTILE DYSFUNCTION   10 tablet   0     BP 142/98  Pulse 108  Temp(Src) 97.3 F (36.3 C) (Oral)  Resp 15  SpO2 96%  Physical Exam  Nursing note and vitals reviewed. Constitutional: He appears well-developed and well-nourished.  HENT:  Head: Normocephalic and atraumatic.  No obvious dental caries no fluctuance of the gingiva. No  tenderness at teeth. No trismus  Eyes: Conjunctivae are normal. Pupils are equal, round, and reactive to light.  Neck: Neck supple. JVD present. No tracheal deviation present. No thyromegaly present.  Positive JVD  Cardiovascular: Regular rhythm.   No murmur heard. Mildly tachycardic  Pulmonary/Chest: Effort normal and breath sounds normal.  Abdominal: Soft. Bowel sounds are normal. He exhibits no distension. There is no tenderness.  Musculoskeletal: Normal range of motion. He exhibits edema. He exhibits no tenderness.  Trace pretibial pitting edema bilaterally  Neurological: He is alert. Coordination normal.  Skin: Skin is warm and dry. No rash noted.  Psychiatric: He has a normal mood and affect.    ED Course  Procedures (including critical care time)  Labs Reviewed - No data to display No results found.  Date: 01/20/2013  Rate: 105  Rhythm: sinus tachycardia  QRS Axis: left  Intervals: normal  ST/T Wave abnormalities: nonspecific T wave changes  Conduction Disutrbances:none  Narrative Interpretation:   Old EKG Reviewed: Tracing from 09/26/2012 showed normal sinus rhythm at 90 beats per minute otherwise no significant change from today's tracing interpreted by me   No diagnosis found. Results for orders placed during the hospital encounter of 01/20/13  CBC      Result Value Range   WBC 10.0  4.0 - 10.5 K/uL   RBC 4.54  4.22 - 5.81 MIL/uL   Hemoglobin 13.8  13.0 - 17.0 g/dL   HCT 32.4  40.1 - 02.7 %   MCV 86.1  78.0 - 100.0 fL   MCH 30.4  26.0 - 34.0 pg   MCHC 35.3  30.0 - 36.0 g/dL   RDW 25.3  66.4 - 40.3 %   Platelets 289  150 - 400 K/uL  PRO B NATRIURETIC PEPTIDE      Result Value Range   Pro B Natriuretic peptide (BNP) 3196.0 (*) 0 - 125 pg/mL  POCT I-STAT, CHEM 8      Result Value Range   Sodium 139  135 - 145 mEq/L   Potassium 3.1 (*) 3.5 - 5.1 mEq/L   Chloride 101  96 - 112 mEq/L   BUN 21  6 - 23 mg/dL   Creatinine, Ser 4.74  0.50 - 1.35 mg/dL   Glucose,  Bld  195 (*) 70 - 99 mg/dL   Calcium, Ion 9.14 (*) 1.12 - 1.23 mmol/L   TCO2 27  0 - 100 mmol/L   Hemoglobin 14.3  13.0 - 17.0 g/dL   HCT 78.2  95.6 - 21.3 %  POCT I-STAT TROPONIN I      Result Value Range   Troponin i, poc 0.25 (*) 0.00 - 0.08 ng/mL   Comment NOTIFIED PHYSICIAN     Comment 3            Dg Chest Port 1 View  01/20/2013  *RADIOLOGY REPORT*  Clinical Data: Shortness of breath.  Chest pain.  PORTABLE CHEST - 1 VIEW  Comparison: Chest x-ray 09/26/2012.  Findings: Lung volumes are low.  Film is underpenetrated limiting the sensitivity and specificity of the examination.  With this limitation in mind there is no definite consolidative airspace disease or pleural effusions.  Pulmonary venous congestion, without frank pulmonary edema.  Mild cardiomegaly which appears to have increased compared to the prior study.  Upper mediastinal contours are unremarkable.  IMPRESSION: 1.  Cardiomegaly with pulmonary venous congestion, but no frank pulmonary edema.   Original Report Authenticated By: Trudie Reed, M.D.     Chest x-ray reviewed by me  MDM  Spoke with Dr. Jens Som who will come to evaluate patient. Patient likely and CHF secondary to medication noncompliance. Patient has dropped chronically elevated troponin from review all of old records. Dr. Jens Som arranged for 23 hour observation Diagnosis #1 congestive heart failure #2 medication noncompliance #3 hypokalemia #4 dental pain       Doug Sou, MD 01/20/13 0865  Doug Sou, MD 01/20/13 [id number]

## 2013-01-20 NOTE — ED Notes (Addendum)
Pt arrives with c/o SOB.  RA sats 99% upon arrival.  Pt in NAD.  Pt denies history of breathing problems.  Pt also c/o toothache.  Pt now also c/o abdominal pain.

## 2013-01-20 NOTE — Progress Notes (Addendum)
Lab call Trop I result 0.63 , Dana PA notified no new order given, will continue to monitor patient.

## 2013-01-21 LAB — GLUCOSE, CAPILLARY
Glucose-Capillary: 147 mg/dL — ABNORMAL HIGH (ref 70–99)
Glucose-Capillary: 135 mg/dL — ABNORMAL HIGH (ref 70–99)
Glucose-Capillary: 151 mg/dL — ABNORMAL HIGH (ref 70–99)

## 2013-01-21 LAB — BASIC METABOLIC PANEL
BUN: 27 mg/dL — ABNORMAL HIGH (ref 6–23)
CO2: 27 mEq/L (ref 19–32)
Chloride: 97 mEq/L (ref 96–112)
Creatinine, Ser: 1.59 mg/dL — ABNORMAL HIGH (ref 0.50–1.35)
Glucose, Bld: 133 mg/dL — ABNORMAL HIGH (ref 70–99)

## 2013-01-21 LAB — TROPONIN I: Troponin I: 0.99 ng/mL (ref <0.30)

## 2013-01-21 MED ORDER — CARVEDILOL 3.125 MG PO TABS
3.1250 mg | ORAL_TABLET | Freq: Two times a day (BID) | ORAL | Status: DC
  Administered 2013-01-21 – 2013-01-26 (×10): 3.125 mg via ORAL
  Filled 2013-01-21 (×12): qty 1

## 2013-01-21 MED ORDER — LISINOPRIL 5 MG PO TABS
5.0000 mg | ORAL_TABLET | Freq: Every day | ORAL | Status: DC
  Administered 2013-01-21 – 2013-01-25 (×5): 5 mg via ORAL
  Filled 2013-01-21 (×6): qty 1

## 2013-01-21 MED ORDER — POTASSIUM CHLORIDE 10 MEQ/100ML IV SOLN
10.0000 meq | INTRAVENOUS | Status: AC
  Administered 2013-01-21 (×3): 10 meq via INTRAVENOUS
  Filled 2013-01-21 (×3): qty 100

## 2013-01-21 MED ORDER — MILRINONE IN DEXTROSE 20 MG/100ML IV SOLN
0.2500 ug/kg/min | INTRAVENOUS | Status: DC
  Administered 2013-01-21 – 2013-01-23 (×4): 0.25 ug/kg/min via INTRAVENOUS
  Filled 2013-01-21 (×5): qty 100

## 2013-01-21 MED ORDER — ZOLPIDEM TARTRATE 5 MG PO TABS
10.0000 mg | ORAL_TABLET | Freq: Every evening | ORAL | Status: DC | PRN
  Administered 2013-01-21 – 2013-01-25 (×4): 10 mg via ORAL
  Filled 2013-01-21 (×4): qty 2

## 2013-01-21 NOTE — Care Management (Signed)
CARE MANAGEMENT NOTE 01/21/2013  Patient:  Douglas [name],Douglas [name]   Account Number:  [id number]  Date Initiated:  01/21/2013  Documentation initiated by:  DAVIS,TYMEEKA  Subjective/Objective Assessment:   37 yo male admitted with CHF. Pt states employed since November, awaiting employer insurance to initiate.     Action/Plan:   Home when stable   Anticipated DC Date:     Anticipated DC Plan:  HOME/SELF CARE      DC Planning Services  CM consult      Choice offered to / List presented to:  NA           Status of service:  Completed, signed off Medicare Important Message given?   (If response is "NO", the following Medicare IM given date fields will be blank) Date Medicare IM given:   Date Additional Medicare IM given:    Discharge Disposition:    Per UR Regulation:    If discussed at Long Length of Stay Meetings, dates discussed:    Comments:  01/21/13 1527 Douglas [name] [phone] Cm spoke with patient concerning discharge planning. pt states does not want HF home screen. Currently employed, lives with mother.Pt states able to afford medications if on [facility] generic drug list. Pt uses [facility] on [address]. No PCP, Cm provided pt with PCP referral list. No other needs listed.

## 2013-01-21 NOTE — Progress Notes (Signed)
   Subjective:  Dyspnea slightly better; abdominal fullness; "ache" on right side of chest yesterday.   Objective:  Filed Vitals:   01/20/13 2024 01/21/13 0059 01/21/13 0524 01/21/13 0902  BP: 92/55 96/66 82/54  126/80  Pulse: 87 82 65 90  Temp: 98.2 F (36.8 C) 98.1 F (36.7 C) 97.2 F (36.2 C)   TempSrc: Oral Oral Oral   Resp: 20 20 20    Height:      Weight:   276 lb 3.8 oz (125.3 kg)   SpO2: 99% 98% 93%     Intake/Output from previous day:  Intake/Output Summary (Last 24 hours) at 01/21/13 1610 Last data filed at 01/21/13 0834  Gross per 24 hour  Intake    950 ml  Output   1350 ml  Net   -400 ml    Physical Exam: Physical exam: Well-developed well-nourished in no acute distress.  Skin is warm and dry.  HEENT is normal.  Neck is supple.  Chest is clear to auscultation with normal expansion.  Cardiovascular exam is regular rate and rhythm.  Abdominal exam nontender or distended. No masses palpated. Extremities show trace edema. neuro grossly intact    Lab Results: Basic Metabolic Panel:  Recent Labs  96/04/54 0819 01/21/13 0116  NA 139 136  K 3.1* 4.1  CL 101 97  CO2  --  27  GLUCOSE 195* 133*  BUN 21 27*  CREATININE 1.20 1.59*  CALCIUM  --  8.8   CBC:  Recent Labs  01/20/13 0749 01/20/13 0819  WBC 10.0  --   HGB 13.8 14.3  HCT 39.1 42.0  MCV 86.1  --   PLT 289  --    Cardiac Enzymes:  Recent Labs  01/20/13 1415 01/20/13 1952 01/21/13 0115  TROPONINI 0.63* 0.72* 0.99*     Assessment/Plan:  1 acute on chronic systolic congestive heart failure-echocardiogram shows severely reduced LV and RV function. He is mildly improved today. However his renal function is slightly worse and he had transient hypotension yesterday. Continue Lasix at present dose. Decrease carvedilol to 3.125 mg by mouth twice a day. Decrease lisinopril to 5 mg daily. Titrate later as tolerated by pulse and blood pressure. He may require milrinone and right heart  catheterization if renal function deteriorates further. 2 cardiomyopathy-will titrate ACE inhibitor and beta blocker as tolerated. I do not think he is a candidate for ICD given his history of noncompliance. 3 mildly elevated troponin-previous catheterization revealed nonobstructive coronary disease. Elevated troponin most likely related to congestive heart failure. No plans for further ischemia evaluation. 4 tobacco abuse-patient counseled on discontinuing. 5 hematuria-in the setting of in and out catheterization. Repeat urinalysis later and workup later if hematuria persists.  Olga Millers 01/21/2013, 9:38 AM

## 2013-01-21 NOTE — Progress Notes (Signed)
  Urine output is poor. Renal function getting worse. Will start milrinone.   Douglas [name] 10:38 PM

## 2013-01-22 LAB — BASIC METABOLIC PANEL
BUN: 34 mg/dL — ABNORMAL HIGH (ref 6–23)
CO2: 25 mEq/L (ref 19–32)
Calcium: 8.9 mg/dL (ref 8.4–10.5)
Chloride: 100 mEq/L (ref 96–112)
Creatinine, Ser: 1.58 mg/dL — ABNORMAL HIGH (ref 0.50–1.35)
Glucose, Bld: 134 mg/dL — ABNORMAL HIGH (ref 70–99)

## 2013-01-22 LAB — GLUCOSE, CAPILLARY: Glucose-Capillary: 158 mg/dL — ABNORMAL HIGH (ref 70–99)

## 2013-01-22 MED ORDER — ISOSORBIDE MONONITRATE ER 30 MG PO TB24
30.0000 mg | ORAL_TABLET | Freq: Every day | ORAL | Status: DC
  Administered 2013-01-22 – 2013-01-25 (×4): 30 mg via ORAL
  Filled 2013-01-22 (×5): qty 1

## 2013-01-22 MED ORDER — HYDRALAZINE HCL 25 MG PO TABS
12.5000 mg | ORAL_TABLET | Freq: Three times a day (TID) | ORAL | Status: DC
  Administered 2013-01-22 – 2013-01-23 (×4): 12.5 mg via ORAL
  Filled 2013-01-22 (×8): qty 0.5

## 2013-01-22 NOTE — Progress Notes (Signed)
Patient alarmed off V. Fib/V-Tach. It was reported that he had a 12-14 beat run of V-Tach. What appears to be on the strip is a Left bundle branch. BP is 104/54 and HR 102. PA notified. No new orders given. Will continue to monitor patient for further changes in condition.

## 2013-01-22 NOTE — Progress Notes (Signed)
   Subjective:   Milrinone added last night for worsening renal fx and poor diuresis. U/o picking up. Renal function stabilizing. BP improved.   Objective:  Filed Vitals:   01/21/13 1655 01/21/13 1744 01/21/13 2150 01/22/13 0552  BP:  100/60 86/64 107/56  Pulse:  6 95 100  Temp:   97.9 F (36.6 C) 98.2 F (36.8 C)  TempSrc:   Oral Oral  Resp:   20 20  Height:      Weight:    124.9 kg (275 lb 5.7 oz)  SpO2: 98%  100% 95%    Intake/Output from previous day:  Intake/Output Summary (Last 24 hours) at 01/22/13 0805 Last data filed at 01/22/13 4782  Gross per 24 hour  Intake    840 ml  Output   1800 ml  Net   -960 ml    Physical Exam: Physical exam: Well-developed well-nourished in no acute distress.  Skin is warm and dry.  HEENT is normal.  Neck is supple. JVP up.  Chest is clear to auscultation with normal expansion.  Cardiovascular exam is regular rate and rhythm.  + soft s3 Abdominal exam nontender or distended. No masses palpated. Extremities no c/c  1-2+ edema R>L  neuro grossly intact    Lab Results: Basic Metabolic Panel:  Recent Labs  95/62/13 0116 01/22/13 0512  NA 136 137  K 4.1 4.1  CL 97 100  CO2 27 25  GLUCOSE 133* 134*  BUN 27* 34*  CREATININE 1.59* 1.58*  CALCIUM 8.8 8.9   CBC:  Recent Labs  01/20/13 0749 01/20/13 0819  WBC 10.0  --   HGB 13.8 14.3  HCT 39.1 42.0  MCV 86.1  --   PLT 289  --    Cardiac Enzymes:  Recent Labs  01/20/13 1415 01/20/13 1952 01/21/13 0115  TROPONINI 0.63* 0.72* 0.99*     Assessment:   1. A/C systolic HF with low output      --NICM EF 15% with RV dysfunction as well 2. Tobacco use 3. Non-compliance 4. A/C renal failure  PLAN/DISCUSSION:  Weight close to baseline but still has volume on board. Responding well to milrinone. Will continue IV lasix and milrinone. Add hydral/NTG (not using Viagra currently). Keep ace-I and b-block at low dose for now.    Daniel Bensimhon 01/22/2013, 8:05  AM

## 2013-01-23 DIAGNOSIS — F17200 Nicotine dependence, unspecified, uncomplicated: Secondary | ICD-10-CM

## 2013-01-23 LAB — BASIC METABOLIC PANEL
CO2: 25 mEq/L (ref 19–32)
Calcium: 8.6 mg/dL (ref 8.4–10.5)
Chloride: 99 mEq/L (ref 96–112)
Potassium: 3.4 mEq/L — ABNORMAL LOW (ref 3.5–5.1)
Sodium: 134 mEq/L — ABNORMAL LOW (ref 135–145)

## 2013-01-23 MED ORDER — SPIRONOLACTONE 12.5 MG HALF TABLET
12.5000 mg | ORAL_TABLET | Freq: Every day | ORAL | Status: DC
  Administered 2013-01-23 – 2013-01-24 (×2): 12.5 mg via ORAL
  Filled 2013-01-23 (×3): qty 1

## 2013-01-23 MED ORDER — FUROSEMIDE 10 MG/ML IJ SOLN
80.0000 mg | Freq: Two times a day (BID) | INTRAMUSCULAR | Status: DC
  Administered 2013-01-23: 80 mg via INTRAVENOUS
  Filled 2013-01-23 (×2): qty 8

## 2013-01-23 MED ORDER — FUROSEMIDE 80 MG PO TABS
80.0000 mg | ORAL_TABLET | Freq: Two times a day (BID) | ORAL | Status: DC
  Administered 2013-01-23: 80 mg via ORAL
  Filled 2013-01-23 (×3): qty 1

## 2013-01-23 MED ORDER — HYDRALAZINE HCL 25 MG PO TABS
25.0000 mg | ORAL_TABLET | Freq: Three times a day (TID) | ORAL | Status: DC
  Administered 2013-01-23 – 2013-01-24 (×3): 25 mg via ORAL
  Filled 2013-01-23 (×6): qty 1

## 2013-01-23 MED ORDER — MILRINONE IN DEXTROSE 20 MG/100ML IV SOLN
0.1250 ug/kg/min | INTRAVENOUS | Status: DC
  Administered 2013-01-23: 0.125 ug/kg/min via INTRAVENOUS
  Administered 2013-01-23 – 2013-01-24 (×3): 0.25 ug/kg/min via INTRAVENOUS
  Administered 2013-01-24 – 2013-01-25 (×2): 0.125 ug/kg/min via INTRAVENOUS
  Filled 2013-01-23 (×4): qty 100

## 2013-01-23 MED ORDER — FUROSEMIDE 10 MG/ML IJ SOLN: 80.0000 mg | Freq: Two times a day (BID) | INTRAMUSCULAR | Status: DC

## 2013-01-23 MED ORDER — POTASSIUM CHLORIDE CRYS ER 20 MEQ PO TBCR
40.0000 meq | EXTENDED_RELEASE_TABLET | Freq: Two times a day (BID) | ORAL | Status: DC
  Administered 2013-01-23 – 2013-01-25 (×6): 40 meq via ORAL
  Filled 2013-01-23 (×7): qty 2

## 2013-01-23 MED ORDER — FUROSEMIDE 80 MG PO TABS
80.0000 mg | ORAL_TABLET | Freq: Two times a day (BID) | ORAL | Status: DC
  Filled 2013-01-23 (×2): qty 1

## 2013-01-23 MED ORDER — HYDROCODONE-ACETAMINOPHEN 5-325 MG PO TABS
1.0000 | ORAL_TABLET | Freq: Four times a day (QID) | ORAL | Status: DC | PRN
  Administered 2013-01-23 – 2013-01-24 (×6): 2 via ORAL
  Administered 2013-01-25: 1 via ORAL
  Administered 2013-01-25 (×2): 2 via ORAL
  Filled 2013-01-23 (×8): qty 2

## 2013-01-23 NOTE — Discharge Summary (Addendum)
Advanced Heart Failure Team  Discharge Summary   Patient ID: Douglas [name] MRN: [id number], DOB/AGE: September 29, 1976 37 y.o. Admit date: 01/20/2013 D/C date:     01/26/2013   Primary Discharge Diagnoses:  1. A/C systolic HF with low output  --NICM EF 15% with RV dysfunction as well  2. A/C renal failure 3. NSVT 4. Tobacco use  5. Non-compliance  6. Tooth abscess - S/P tooth extraction 01/26/13  -  Hospital Course:  [name] is a 37 y.o. male with a h/o HTN, HL, obesity and recovered systolic HF due to NICM (EF 55% per echo 12/2011 up from 25-30%).  However followup echocardiogram in August of 2013 showed an ejection fraction of 25%; moderate to severe mitral regurgitation, moderate left atrial enlargement and right side enlargement. THe underwent LHC in 2006 showing normal coronaries. Most recent CPX in August 2012 showed VO2 20.   He presented to [facility] emergency department 01/20/13 with progressive dyspnea on exertion, orthopnea, and PND.  He had  been taking his lasix but ran out of his Coreg. He also reported intermittent use of lisinopril. Pertinent admission labs included Pro BNP 3196, Potassium 3.1, Hemoglobin 13.8, troponin 0.25. CXR revealed cardiomegaly and pulmonary venous congestion. A repeat ECHO was performed and his EF was significantly reduced down to 15% from 25% in August 2013.  Initially he was given IV lasix but due to elevated renal function and sluggish diuresis, Milrinone was added. Milrinone was weaned off without difficulty and he transitioned to po lasix. HF medications were added at a reduced dose. Overall he diuresed 7 pounds. He will not be placed on Spironolactone due to noncompliance. Viagra stopped due to the addition of nitrates.  Discharge weight 268 pounds.   On 01/24/13 tooth abscess rupture noted. Dr Kristin Bruins consulted with removal of tooth #19 on 01/26/13. He will continue on Clindamycin for 5 days.    He will continue to be followed closley in the HF clinic  with his follow up appointment scheduled on January 31, 2013 at 11:45.    Discharge Weight Range: Discharge weight 268 pounds.  Discharge Vitals: Blood pressure 130/76, pulse 53, temperature 97 F (36.1 C), temperature source Oral, resp. rate 33, height 6\' 4"  (1.93 m), weight 268 lb 4.8 oz (121.7 kg), SpO2 97.00%.  Labs: Lab Results  Component Value Date   WBC 9.5 01/25/2013   HGB 13.8 01/25/2013   HCT 39.1 01/25/2013   MCV 84.6 01/25/2013   PLT 319 01/25/2013     Recent Labs Lab 01/25/13 1643 01/26/13 0605  NA 128* 132*  K 5.6* 4.9  CL 96 95*  CO2 22 23  BUN 24* 28*  CREATININE 1.38* 1.33  CALCIUM 9.2 9.4  PROT 7.4  --   BILITOT 0.9  --   ALKPHOS 69  --   ALT 46  --   AST 29  --   GLUCOSE 150* 125*   Lab Results  Component Value Date   CHOL 169 06/23/2012   HDL 47 06/23/2012   LDLCALC 829* 06/23/2012   TRIG 97 06/23/2012   BNP (last 3 results)  Recent Labs  01/20/13 0750 01/22/13 0512 01/24/13 1102  PROBNP 3196.0* 1853.0* 3126.0*    Diagnostic Studies/Procedures   Dg Orthopantogram  01/24/2013  *RADIOLOGY REPORT*  Clinical Data: Left jaw pain with swelling.  Tooth abscess.  ORTHOPANTOGRAM/PANORAMIC  Comparison: None.  Findings: The wisdom teeth are absent bilaterally.  Root canal has been performed on one of the left mandibular molars.  There is no peri apical lucency to suggest an abscess.  There is midline artifact typical of the Panorex technique.  The mandible and temporomandibular joints appear normal.  IMPRESSION: No evidence of apical abscess or significant periodontal disease.   Original Report Authenticated By: Carey Bullocks, M.D.     Discharge Medications     Medication List    STOP taking these medications       ALEVE PO     predniSONE 20 MG tablet  Commonly known as:  DELTASONE     VIAGRA 100 MG tablet  Generic drug:  sildenafil      TAKE these medications       albuterol 108 (90 BASE) MCG/ACT inhaler  Commonly known as:  PROVENTIL  HFA;VENTOLIN HFA  Inhale 2 puffs into the lungs every 6 (six) hours as needed for wheezing.     aspirin 81 MG EC tablet  Take 1 tablet (81 mg total) by mouth daily.     carvedilol 3.125 MG tablet  Commonly known as:  COREG  Take 1 tablet (3.125 mg total) by mouth 2 (two) times daily with a meal.     clindamycin 150 MG capsule  Commonly known as:  CLEOCIN  Take 1 capsule (150 mg total) by mouth every 8 (eight) hours.     dextromethorphan-guaiFENesin 30-600 MG per 12 hr tablet  Commonly known as:  MUCINEX DM  Take 1 tablet by mouth every 12 (twelve) hours as needed (cough and congestion).     furosemide 80 MG tablet  Commonly known as:  LASIX  Take 1 tablet (80 mg total) by mouth 2 (two) times daily.     hydrALAZINE 25 MG tablet  Commonly known as:  APRESOLINE  Take 1.5 tablets (37.5 mg total) by mouth every 8 (eight) hours.     HYDROcodone-acetaminophen 5-325 MG per tablet  Commonly known as:  NORCO/VICODIN  Take 1-2 tablets by mouth every 6 (six) hours as needed.     isosorbide mononitrate 30 MG 24 hr tablet  Commonly known as:  IMDUR  Take 1 tablet (30 mg total) by mouth daily.     lisinopril 5 MG tablet  Commonly known as:  PRINIVIL,ZESTRIL  Take 1 tablet (5 mg total) by mouth daily.     potassium chloride SA 20 MEQ tablet  Commonly known as:  K-DUR,KLOR-CON  Take 1 tablet (20 mEq total) by mouth daily.        Disposition   The patient will be discharged in stable condition to home. Discharge Orders   Future Appointments Provider Department Dept Phone   01/31/2013 12:00 PM [facility] [phone]   Future Orders Complete By Expires     (HEART FAILURE PATIENTS) Call MD:  Anytime you have any of the following symptoms: 1) 3 pound weight gain in 24 hours or 5 pounds in 1 week 2) shortness of breath, with or without a dry hacking cough 3) swelling in the hands, feet or stomach 4) if you have to sleep on extra  pillows at night in order to breathe.  As directed     ACE Inhibitor / ARB already ordered  As directed     Diet - low sodium heart healthy  As directed     Heart Failure patients record your daily weight using the same scale at the same time of day  As directed     Increase activity slowly  As directed  Follow-up Information   Follow up with Charlynne Pander, DDS. Schedule an appointment as soon as possible for a visit in 10 days.   Contact information:   [address] [place] [address] [phone]       Follow up with [name], MD On 01/31/2013. (at 11:45 Garage Code [id number])    Contact information:   [address] [place] [address] [phone]         Duration of Discharge Encounter: Greater than 35 minutes   Signed, CLEGG,AMY  01/26/2013, 9:57 AM  Patient seen and examined with Tonye Becket, NP. We discussed all aspects of the encounter. I agree with the assessment and plan as stated above.   HF much improved. Ready for d/c. Long discussion about need for compliance. Continue clinda for oral abscess. F/u Next week with BMET and titration of carvedilol, if possible. If BP remains soft may need to stop lisinopril for a bit and focus on titration of hyrdal/nitrates and carvedilol in the short term.   Daniel Bensimhon,MD 2:22 PM

## 2013-01-23 NOTE — Progress Notes (Signed)
Patient had another 5 beat run of V-Tach. Patient was sleeping at the time this occurred. No signs or symptoms of distress or discomfort. PA notified and no new orders given. Will continue to monitor patient for further changes in condition.Marland Kitchen

## 2013-01-23 NOTE — Progress Notes (Signed)
   Subjective:   I/Os: -2.6 L.  Weight down 6 pounds overnight, 269 pounds.  Cr 1.59>>1.32.  On milrinone 0.25 mcg/kg/min.    +tooth pain     Objective:  Filed Vitals:   01/22/13 1708 01/22/13 2132 01/22/13 2138 01/23/13 0524  BP: 106/60 110/63 119/83 108/46  Pulse: 107  106 106  Temp:   99.1 F (37.3 C) 98.7 F (37.1 C)  TempSrc:    Oral  Resp: 18  20 18   Height:      Weight:    269 lb 13.5 oz (122.4 kg)  SpO2: 96%  96% 95%    Intake/Output from previous day:  Intake/Output Summary (Last 24 hours) at 01/23/13 0851 Last data filed at 01/23/13 0716  Gross per 24 hour  Intake 2387.39 ml  Output   4035 ml  Net -1647.61 ml    Physical Exam: Physical exam: Well-developed well-nourished in no acute distress.  Skin is warm and dry.  HEENT is normal.  Neck is supple. JVP 7.  Chest is clear to auscultation with normal expansion.  Cardiovascular exam is regular rate and rhythm.  + soft s3 Abdominal exam nontender or distended. No masses palpated. Extremities no c/c  1-2+ ankle edema L> R edema  neuro grossly intact    Lab Results: Basic Metabolic Panel:  Recent Labs  32/44/01 0512 01/23/13 0556  NA 137 134*  K 4.1 3.4*  CL 100 99  CO2 25 25  GLUCOSE 134* 115*  BUN 34* 25*  CREATININE 1.58* 1.32  CALCIUM 8.9 8.6   CBC: No results found for this basename: WBC, NEUTROABS, HGB, HCT, MCV, PLT,  in the last 72 hours Cardiac Enzymes:  Recent Labs  01/20/13 1415 01/20/13 1952 01/21/13 0115  TROPONINI 0.63* 0.72* 0.99*     Assessment:   1. A/C systolic HF with low output      --NICM EF 15% with RV dysfunction as well 2. Tobacco use 3. Non-compliance 4. A/C renal failure  PLAN/DISCUSSION:  Volume status improved on IV lasix and milrinone gtt.  Will begin to wean milrinone 0.125 mcg/kg/min today.  Change IV lasix to po lasix 80 mg daily.  If weight and renal function stable hopefully home tomorrow.  Titrate hydral/ntg with decrease in milrinone. Keep  ace-I and b-block at low dose for now.    Robbi Garter, [facility] 01/23/2013, 8:51 AM  Patient seen and examined with Ulyess Blossom, PA-C. We discussed all aspects of the encounter.  Diuresing well but still with volume on board. Would continue IV lasix and current dose of milrinone one more day. Agree with titrating hydral/NTG. Supp K+. Can add low dose spiro.   Daniel Bensimhon,MD 9:35 AM

## 2013-01-23 NOTE — Progress Notes (Signed)
Patient had a 5 beat run of V-Tach. Patient was asymptomatic at the time. No complaints and no signs or symptoms of distress or discomfort. BP is 124/72 and HR is 101. PA notified and no new orders given. Will continue to monitor patient for further changes in condition.

## 2013-01-24 ENCOUNTER — Inpatient Hospital Stay (HOSPITAL_COMMUNITY): Payer: Medicaid Other

## 2013-01-24 LAB — BASIC METABOLIC PANEL
Chloride: 96 mEq/L (ref 96–112)
GFR calc non Af Amer: 63 mL/min — ABNORMAL LOW (ref >90)
Glucose, Bld: 157 mg/dL — ABNORMAL HIGH (ref 70–99)
Potassium: 4.7 mEq/L (ref 3.5–5.1)
Sodium: 131 mEq/L — ABNORMAL LOW (ref 135–145)

## 2013-01-24 LAB — PRO B NATRIURETIC PEPTIDE: Pro B Natriuretic peptide (BNP): 3126 pg/mL — ABNORMAL HIGH (ref 0–125)

## 2013-01-24 MED ORDER — MAGIC MOUTHWASH W/LIDOCAINE
5.0000 mL | Freq: Four times a day (QID) | ORAL | Status: DC
  Administered 2013-01-24 – 2013-01-25 (×4): 5 mL via ORAL
  Filled 2013-01-24 (×7): qty 5

## 2013-01-24 MED ORDER — HYDRALAZINE HCL 25 MG PO TABS
37.5000 mg | ORAL_TABLET | Freq: Three times a day (TID) | ORAL | Status: DC
  Administered 2013-01-24 – 2013-01-26 (×4): 37.5 mg via ORAL
  Filled 2013-01-24 (×9): qty 1.5

## 2013-01-24 MED ORDER — FUROSEMIDE 40 MG PO TABS
40.0000 mg | ORAL_TABLET | Freq: Two times a day (BID) | ORAL | Status: DC
  Administered 2013-01-24 – 2013-01-26 (×5): 40 mg via ORAL
  Filled 2013-01-24 (×7): qty 1

## 2013-01-24 MED ORDER — CHLORHEXIDINE GLUCONATE 0.12 % MT SOLN
15.0000 mL | Freq: Four times a day (QID) | OROMUCOSAL | Status: DC
  Administered 2013-01-24 – 2013-01-25 (×7): 15 mL via OROMUCOSAL
  Filled 2013-01-24 (×13): qty 15

## 2013-01-24 MED ORDER — CLINDAMYCIN HCL 150 MG PO CAPS
150.0000 mg | ORAL_CAPSULE | Freq: Three times a day (TID) | ORAL | Status: DC
  Administered 2013-01-24 – 2013-01-26 (×7): 150 mg via ORAL
  Filled 2013-01-24 (×9): qty 1

## 2013-01-24 NOTE — Progress Notes (Signed)
Received phone call from staff nurse regarding tooth abscess that erupted.   Start peridex qid, magic mouthwash qid, and add clindamycin 150 mg tid.   Discussed with Dr [name] and he agrees with the above plan. Consult oral surgeon.   CLEGG,AMY  NP-C  2:17 PM

## 2013-01-24 NOTE — Progress Notes (Signed)
   Subjective:   I/Os: -2.46 L.  Weight down 1 pounds overnight, 268pounds.  Cr 1.59>>1.32> pending.  Milrinone continued at 0.25 mcg.   Denies SOB/PND/Orthopnea/ Still c/o severe tooth pain.     Objective:  Filed Vitals:   01/23/13 1422 01/23/13 1739 01/23/13 2034 01/24/13 0550  BP: 112/56 115/89 125/88 144/58  Pulse: 109 109 113 64  Temp: 98.4 F (36.9 C)  98.1 F (36.7 C) 98.1 F (36.7 C)  TempSrc: Oral  Oral Oral  Resp: 19  20 20   Height:      Weight:    268 lb 4.8 oz (121.7 kg)  SpO2: 96%  96% 95%    Intake/Output from previous day:  Intake/Output Summary (Last 24 hours) at 01/24/13 0714 Last data filed at 01/24/13 0700  Gross per 24 hour  Intake 1594.39 ml  Output   4000 ml  Net -2405.61 ml    Physical Exam: Physical exam: Well-developed well-nourished in no acute distress.  Skin is warm and dry.  HEENT is normal.  Neck is supple. JVP 7.  Chest is clear to auscultation with normal expansion.  Cardiovascular exam is regular rate and rhythm.  + soft s3 Abdominal exam nontender or distended. No masses palpated. Extremities no c/c  Trace RLE LLE . Bilateral ted hose Neuro grossly intact    Lab Results: Basic Metabolic Panel:  Recent Labs  16/10/96 0512 01/23/13 0556  NA 137 134*  K 4.1 3.4*  CL 100 99  CO2 25 25  GLUCOSE 134* 115*  BUN 34* 25*  CREATININE 1.58* 1.32  CALCIUM 8.9 8.6   CBC: No results found for this basename: WBC, NEUTROABS, HGB, HCT, MCV, PLT,  in the last 72 hours Cardiac Enzymes: No results found for this basename: CKTOTAL, CKMB, CKMBINDEX, TROPONINI,  in the last 72 hours  Scheduled Meds: . aspirin EC  81 mg Oral Daily  . carvedilol  3.125 mg Oral BID WC  . furosemide  40 mg Oral BID  . heparin  5,000 Units Subcutaneous Q8H  . hydrALAZINE  37.5 mg Oral Q8H  . isosorbide mononitrate  30 mg Oral Daily  . lisinopril  5 mg Oral Daily  . potassium chloride  40 mEq Oral BID  . sodium chloride  3 mL Intravenous Q12H  .  spironolactone  12.5 mg Oral Daily   Continuous Infusions: . milrinone 0.25 mcg/kg/min (01/24/13 0409)   PRN Meds:.sodium chloride, acetaminophen, acetaminophen, albuterol, HYDROcodone-acetaminophen, ondansetron (ZOFRAN) IV, sodium chloride, zolpidem Assessment:   1. A/C systolic HF with low output      --NICM EF 15% with RV dysfunction as well 2. Tobacco use 3. Non-compliance 4. A/C renal failure 5. NSVT 6. Hypokalemia  PLAN/DISCUSSION:  Volume status stable. Stop IV lasix. Start lasix 40 mg po bid. Cut back Milrinone 0.125 mcg.  Increase hydralazine 37.5 mg tid. Continue spironolactone while inpatient but will not use once discharged due to noncompliance.   Consult cardiac rehab and dietitian.  CLEGG,AMY  NP-C  7:25 AM  Patient seen and examined with Tonye Becket, NP. We discussed all aspects of the encounter. I agree with the assessment and plan as stated above.   Volume status much improved. Can begin wean of milrinone. Change lasix to IV. I have called the dental clinic to see if they can see him as an inpatient.   Home probably later tomorrow or Friday once milrinone off.  Daniel Bensimhon,MD 7:48 AM

## 2013-01-24 NOTE — Progress Notes (Signed)
CARDIAC REHAB PHASE I   PRE:  Rate/Rhythm: 105 ST    BP: sitting 118/83    SaO2: 91-92 RA  MODE:  Ambulation: 500 ft   POST:  Rate/Rhythm: 121 ST    BP: sitting 110/66     SaO2: 87-88 RA -->92 RA with rest  Tolerated fairly well. Denied SOB, even though SaO2 low after walk. Sts his abdomen still feels full/tight "like I got to pee". Gave HF booklet and began review. [id number]   Elissa Lovett [employer], ACSM 01/24/2013 3:23 PM

## 2013-01-25 ENCOUNTER — Encounter (HOSPITAL_COMMUNITY): Payer: Self-pay | Admitting: Dentistry

## 2013-01-25 DIAGNOSIS — I509 Heart failure, unspecified: Secondary | ICD-10-CM

## 2013-01-25 DIAGNOSIS — K0889 Other specified disorders of teeth and supporting structures: Secondary | ICD-10-CM

## 2013-01-25 LAB — COMPREHENSIVE METABOLIC PANEL
ALT: 46 U/L (ref 0–53)
AST: 29 U/L (ref 0–37)
Albumin: 3 g/dL — ABNORMAL LOW (ref 3.5–5.2)
Calcium: 9.2 mg/dL (ref 8.4–10.5)
Chloride: 96 mEq/L (ref 96–112)
Creatinine, Ser: 1.38 mg/dL — ABNORMAL HIGH (ref 0.50–1.35)
Sodium: 128 mEq/L — ABNORMAL LOW (ref 135–145)

## 2013-01-25 LAB — BASIC METABOLIC PANEL
CO2: 23 mEq/L (ref 19–32)
Calcium: 9.1 mg/dL (ref 8.4–10.5)
Chloride: 98 mEq/L (ref 96–112)
Creatinine, Ser: 1.35 mg/dL (ref 0.50–1.35)
GFR calc Af Amer: 76 mL/min — ABNORMAL LOW (ref >90)
Sodium: 132 mEq/L — ABNORMAL LOW (ref 135–145)

## 2013-01-25 LAB — CBC
HCT: 39.1 % (ref 39.0–52.0)
Hemoglobin: 13.8 g/dL (ref 13.0–17.0)
MCH: 29.9 pg (ref 26.0–34.0)
MCHC: 35.3 g/dL (ref 30.0–36.0)
MCV: 84.6 fL (ref 78.0–100.0)
RBC: 4.62 MIL/uL (ref 4.22–5.81)

## 2013-01-25 LAB — MAGNESIUM: Magnesium: 2.3 mg/dL (ref 1.5–2.5)

## 2013-01-25 MED ORDER — CLINDAMYCIN PHOSPHATE 600 MG/50ML IV SOLN
600.0000 mg | Freq: Once | INTRAVENOUS | Status: AC
  Administered 2013-01-26: 600 mg via INTRAVENOUS
  Filled 2013-01-25: qty 50

## 2013-01-25 MED ORDER — MIDAZOLAM HCL 2 MG/2ML IJ SOLN: 1.0000 mg | INTRAMUSCULAR | Status: DC | PRN

## 2013-01-25 MED ORDER — LACTATED RINGERS IV SOLN
INTRAVENOUS | Status: DC
  Administered 2013-01-25: 1000 mL via INTRAVENOUS

## 2013-01-25 MED ORDER — FENTANYL CITRATE 0.05 MG/ML IJ SOLN: 50.0000 ug | INTRAMUSCULAR | Status: DC | PRN

## 2013-01-25 MED ORDER — FUROSEMIDE 40 MG PO TABS
40.0000 mg | ORAL_TABLET | Freq: Once | ORAL | Status: AC
  Administered 2013-01-25: 40 mg via ORAL
  Filled 2013-01-25: qty 1

## 2013-01-25 NOTE — Progress Notes (Signed)
Consent obtained from pt for tooth extraction tomorrow by Dr.kulinski.   Nellie Buck,RN.

## 2013-01-25 NOTE — Progress Notes (Signed)
Pt  Had 13 beats of VT this am at 08 51 and noted he was asymptomatic & stated he was trying to get his footies off.  Amy Clegg informed.  No new orders given.  Will continue to monitor.  Amanda Pea, Charity fundraiser.

## 2013-01-25 NOTE — Progress Notes (Signed)
Subjective:   I/Os: -1.3 L.  Weight unchanged. Yesterday hydralazine increased to 37.5 mg tid and Milrinone weaned to 0.125 mcg.  Cr 1.59>>1.32> 1.39>1.35   Dental consult pending for abscess. 01/24/13 Started on Peridex, magic mouthwash and clindamycin.   01/24/13. Orthopanoramic- No abscess or peridontal disease.   Denies SOB/PND/Orthopnea/ Still c/o severe tooth pain.     Objective:  Filed Vitals:   01/24/13 1826 01/24/13 2038 01/24/13 2100 01/25/13 0500  BP: 115/90 84/45 94/55  97/59  Pulse: 121 98 98 95  Temp:  98.2 F (36.8 C)  98 F (36.7 C)  TempSrc:  Oral  Oral  Resp:  20  20  Height:      Weight:    268 lb 1.3 oz (121.6 kg)  SpO2:  93%  95%    Intake/Output from previous day:  Intake/Output Summary (Last 24 hours) at 01/25/13 0813 Last data filed at 01/25/13 0700  Gross per 24 hour  Intake 936.65 ml  Output   2175 ml  Net -1238.35 ml    Physical Exam: Physical exam: Well-developed well-nourished in no acute distress.  Skin is warm and dry.  HEENT is normal.  Neck is supple. JVP 7.  Chest is clear to auscultation with normal expansion.  Cardiovascular exam is regular rate and rhythm.  + soft s3 Abdominal exam nontender or distended. No masses palpated. Extremities no c/c  No RLE LLE . Bilateral ted hose Neuro grossly intact    Lab Results: Basic Metabolic Panel:  Recent Labs  04/54/09 1105 01/25/13 0609  NA 131* 132*  K 4.7 4.4  CL 96 98  CO2 25 23  GLUCOSE 157* 106*  BUN 17 21  CREATININE 1.39* 1.35  CALCIUM 9.0 9.1   CBC: No results found for this basename: WBC, NEUTROABS, HGB, HCT, MCV, PLT,  in the last 72 hours Cardiac Enzymes: No results found for this basename: CKTOTAL, CKMB, CKMBINDEX, TROPONINI,  in the last 72 hours  Scheduled Meds: . aspirin EC  81 mg Oral Daily  . carvedilol  3.125 mg Oral BID WC  . chlorhexidine  15 mL Mouth/Throat QID  . clindamycin  150 mg Oral Q8H  . furosemide  40 mg Oral BID  . heparin  5,000  Units Subcutaneous Q8H  . hydrALAZINE  37.5 mg Oral Q8H  . isosorbide mononitrate  30 mg Oral Daily  . lisinopril  5 mg Oral Daily  . magic mouthwash w/lidocaine  5 mL Oral QID  . potassium chloride  40 mEq Oral BID  . sodium chloride  3 mL Intravenous Q12H  . spironolactone  12.5 mg Oral Daily   Continuous Infusions: . milrinone 0.125 mcg/kg/min (01/25/13 0306)   PRN Meds:.sodium chloride, acetaminophen, acetaminophen, albuterol, HYDROcodone-acetaminophen, ondansetron (ZOFRAN) IV, sodium chloride, zolpidem Assessment:   1. A/C systolic HF with low output      --NICM EF 15% with RV dysfunction as well 2. Tobacco use 3. Non-compliance 4. A/C renal failure 5. NSVT 6. Hypokalemia 7. Tooth Abscess   PLAN/DISCUSSION: Volume status stable. Weight unchanged 268 pounds. Stop Milrinone. BP soft will go ahead and stop spironolactone. Will not use Spironolactone once discharged due to noncompliance.   Continue peridex/magic mouthwash. Continue clindamycin for total of 5 days. Dental recommendations pending.   Appreciate  cardiac rehab and dietitian input.   CLEGG,AMY  NP-C  8:13 AM  Patient seen and examined with Tonye Becket, NP. We discussed all aspects of the encounter. I agree with the assessment and plan as  stated above.   Volume status looks better. Renal function stable. Will stop milrinone. Continue hydral/nitrates and low dose b-blocker and lisinopril.  No ACE due to renal failure.   Agree with clinda for tooth. Dr. Kristin Bruins to see today (thanks!).  Likely home tomorrow.  Truman Hayward 8:31 AM

## 2013-01-25 NOTE — Consult Note (Signed)
DENTAL CONSULTATION  Date of Consultation:  01/25/2013 Patient Name:   Douglas [name] Date of Birth:   1976/07/10 Medical Record Number: [id number]  VITALS: BP 133/84  Pulse 99  Temp(Src) 98 F (36.7 C) (Oral)  Resp 20  Ht 6\' 4"  (1.93 m)  Wt 268 lb 1.3 oz (121.6 kg)  BMI 32.65 kg/m2  SpO2 95%   CHIEF COMPLAINT: Patient referred for dental consultation for evaluation of toothache.  HPI: Douglas [name] is a 37 year old male with history of recent hospital admission for acute on chronic heart failure. Patient is followed by Dr. Nicholes Mango.  Dental consultation was requested for evaluation of lower left molar toothache and possible dental abscess. Patient is now seen to rule out dental infection that may affect the patient's systemic health.  Patient with a history of toothache symptoms for approximately one week. Patient describes the pain as being initially dull and achy in changing to a sharp in nature. Pain reached an intensity of 10 out of 10. The tooth developed a soft tissue abscess on the buccal aspect that broke yesterday around 2 to 3 PM. The pain has been minimal since that time. Patient now is complaining of a dull achy pain reaching an intensity of 3/10. This is controlled with his pain medication. Patient is currently on clindamycin oral antibiotic therapy along with chlorhexidine rinses.  Patient indicates that this tooth previously had a root canal therapy in 2003 and eventually had a post and core and a crown placed. This was done by a dentist in [place]. The root canal therapy was possibly done in [place] although the patient is not exactly sure. Patient has been unable to obtain regular dental care recently due to his health concerns .  PMH: Past Medical History  Diagnosis Date  . Hypertension   . Hyperlipidemia   . Systolic CHF, chronic   . Obesity   . Nonischemic cardiomyopathy     a.  echo 4/06: EF 30%, mild to mod MR, mild RAE, inf HK, lat HK ,  ant HK;    b.  cath 4/06: no CAD, EF 20-25%  . ED (erectile dysfunction)   . NSVT (nonsustained ventricular tachycardia)     eval by EP in past; no ICD candidate due to NYHA 1 symptoms    PSH: Past Surgical History  Procedure Laterality Date  . Cardiac catheterization  09/2011    ALLERGIES: Allergies  Allergen Reactions  . Penicillins Other (See Comments)    Unknown childhood reaction     MEDICATIONS: Current Facility-Administered Medications  Medication Dose Route Frequency Corrie Reder Last Rate Last Dose  . 0.9 %  sodium chloride infusion  250 mL Intravenous PRN Lewayne Bunting, MD      . acetaminophen (TYLENOL) tablet 650 mg  650 mg Oral Q4H PRN Lewayne Bunting, MD   650 mg at 01/23/13 0716  . acetaminophen (TYLENOL) tablet 650 mg  650 mg Oral Q6H PRN Lewayne Bunting, MD   650 mg at 01/22/13 2242  . albuterol (PROVENTIL HFA;VENTOLIN HFA) 108 (90 BASE) MCG/ACT inhaler 2 puff  2 puff Inhalation Q6H PRN Lewayne Bunting, MD   2 puff at 01/21/13 1654  . aspirin EC tablet 81 mg  81 mg Oral Daily Lewayne Bunting, MD   81 mg at 01/25/13 6213  . carvedilol (COREG) tablet 3.125 mg  3.125 mg Oral BID WC Lewayne Bunting, MD   3.125 mg at 01/25/13 0904  . chlorhexidine (PERIDEX) 0.12 %  solution 15 mL  15 mL Mouth/Throat QID Sherald Hess, NP   15 mL at 01/24/13 2234  . clindamycin (CLEOCIN) capsule 150 mg  150 mg Oral Q8H Amy D Clegg, NP   150 mg at 01/25/13 1610  . furosemide (LASIX) tablet 40 mg  40 mg Oral BID Sherald Hess, NP   40 mg at 01/25/13 0905  . heparin injection 5,000 Units  5,000 Units Subcutaneous Q8H Charlynne Pander, DDS   5,000 Units at 01/25/13 [phone]  . hydrALAZINE (APRESOLINE) tablet 37.5 mg  37.5 mg Oral Q8H Amy D Clegg, NP   37.5 mg at 01/24/13 1529  . HYDROcodone-acetaminophen (NORCO/VICODIN) 5-325 MG per tablet 1-2 tablet  1-2 tablet Oral Q6H PRN Hadassah Pais, PA-C   2 tablet at 01/25/13 [phone]  . isosorbide mononitrate (IMDUR) 24 hr tablet 30 mg  30 mg Oral Daily  Dolores Patty, MD   30 mg at 01/25/13 0907  . lisinopril (PRINIVIL,ZESTRIL) tablet 5 mg  5 mg Oral Daily Lewayne Bunting, MD   5 mg at 01/25/13 0906  . magic mouthwash w/lidocaine  5 mL Oral QID Sherald Hess, NP   5 mL at 01/24/13 2235  . ondansetron (ZOFRAN) injection 4 mg  4 mg Intravenous Q6H PRN Lewayne Bunting, MD      . potassium chloride SA (K-DUR,KLOR-CON) CR tablet 40 mEq  40 mEq Oral BID Hadassah Pais, PA-C   40 mEq at 01/25/13 8119  . sodium chloride 0.9 % injection 3 mL  3 mL Intravenous Q12H Lewayne Bunting, MD   3 mL at 01/25/13 0907  . sodium chloride 0.9 % injection 3 mL  3 mL Intravenous PRN Lewayne Bunting, MD      . zolpidem (AMBIEN) tablet 10 mg  10 mg Oral QHS PRN Dayna N Dunn, PA-C   10 mg at 01/23/13 2314    LABS: Lab Results  Component Value Date   WBC 10.0 01/20/2013   HGB 14.3 01/20/2013   HCT 42.0 01/20/2013   MCV 86.1 01/20/2013   PLT 289 01/20/2013      Component Value Date/Time   NA 132* 01/25/2013 0609   K 4.4 01/25/2013 0609   CL 98 01/25/2013 0609   CO2 23 01/25/2013 0609   GLUCOSE 106* 01/25/2013 0609   BUN 21 01/25/2013 0609   CREATININE 1.35 01/25/2013 0609   CALCIUM 9.1 01/25/2013 0609   GFRNONAA 65* 01/25/2013 0609   GFRAA 76* 01/25/2013 0609   Lab Results  Component Value Date   INR 1.15 06/22/2012   INR 1.27 09/20/2011   No results found for this basename: PTT    SOCIAL HISTORY: History   Social History  . Marital Status: Legally Separated    Spouse Name: N/A    Number of Children: 5  . Years of Education: N/A   Occupational History  . unemployed    Social History Main Topics  . Smoking status: Current Every Day Smoker -- 0.30 packs/day for 4 years    Types: Cigarettes    Last Attempt to Quit: 06/21/2012  . Smokeless tobacco: Never Used  . Alcohol Use: 3.6 oz/week    6 Cans of beer per week     Comment: 06/22/2012 "maybe 6 pk beer/wk"  . Drug Use: Yes    Special: Cocaine     Comment: 06/22/2012 "last cocaine ~ 6 months ago"  Last  use of cocaine in October 2013  . Sexually Active: Yes  Other Topics Concern  . Not on file   Social History Narrative  . No narrative on file    FAMILY HISTORY: Family History  Problem Relation Age of Onset  . Coronary artery disease Mother 70    s/p PCI  . Lung cancer Father   . Diabetes type II Maternal Uncle   . Coronary artery disease Maternal Uncle      REVIEW OF SYSTEMS: Reviewed from chart for this admission.  DENTAL HISTORY: CHIEF COMPLAINT: Patient referred for dental consultation for evaluation of toothache.  HPI: Douglas [name] is a 37 year old male with history of recent hospital admission for acute on chronic heart failure. Patient is followed by Dr. Nicholes Mango.  Dental consultation was requested for evaluation of lower left molar toothache and possible dental abscess. Patient is now seen to rule out dental infection that may affect the patient's systemic health.  Patient with a history of toothache symptoms for approximately one week. Patient describes the pain as being initially dull and achy in changing to a sharp in nature. Pain reached an intensity of 10 out of 10. The tooth developed a soft tissue abscess on the buccal aspect that broke yesterday around 2 to 3 PM. The pain has been minimal since that time. Patient now is complaining of a dull achy pain reaching an intensity of 3/10. This is controlled with his pain medication. Patient is currently on clindamycin oral antibiotic therapy along with chlorhexidine rinses.  Patient indicates that this tooth previously had a root canal therapy in 2003 and eventually had a post and core and a crown placed. This was done by a dentist in [place]. The root canal therapy was possibly done in [place] although the patient is not exactly sure. Patient has been unable to obtain regular dental care recently due to his health concerns .   DENTAL EXAMINATION:  GENERAL: Patient is a well-developed,  well-nourished male in no acute distress. HEAD AND NECK: There is some left mandibular facial swelling. There is some left submandibular lymphadenopathy that is able to be palpated. Patient denies acute TMJ symptoms. The patient has at least 40-50 mm of maximum interincisal opening and no symptoms of trismus. INTRAORAL EXAM: Patient has normal saliva. There is evidence of abscess formation in the area of #19 on the buccal aspect. There are bilateral, multilobular mandibular lingual tori. DENTITION: Patient is missing tooth numbers 1, 15, 16, 17, and 32. PERIODONTAL: Patient has chronic periodontitis with some plaque accumulations, selective areas of gingival recession and no significant tooth mobility. DENTAL CARIES/SUBOPTIMAL RESTORATIONS: There are no obvious dental caries noted. I would suggest a full series of dental radiographs with the primary dentist of his choice in the future to rule out incipient dental caries. ENDODONTIC: Patient with a history of acute pulpitis symptoms coming from tooth #19. Tooth #19 is sensitive to percussion and palpation. Tooth #19 has had previous root canal therapy. There is a buccal abscess in the area of tooth #19 with possible periapical pathology noted on the orthopantogram. CROWN AND BRIDGE: Patient has a crown on tooth #19. PROSTHODONTIC: There are no partial dentures OCCLUSION: Patient has a stable occlusion at this time  RADIOGRAPHIC INTERPRETATION: An orthopantogram was obtained 01/24/2013. Patient has missing tooth numbers 1, 15, 16, 17, and 32. Tooth #19 has had a previous root canal therapy. Tooth #19 has a root canal with a post and core. There is possible periapical radiolucency in pathology associated with the apices of tooth #19. There is incipient to  moderate bone loss.   ASSESSMENTS: 1. History of acute pulpitis symptoms coming from tooth #19 2. Buccal abscess in the area of #19 with periapical pathology 3. History of previous root canal therapy  associated with tooth #19 with periapical pathology  4. Missing tooth numbers 1, 15, 16, 17, and 32 5. Chronic periodontitis 6. Accretions 7. Stable occlusion 8. Bilateral multilobular mandibular tori 9. Significant cardiovascular compromise and heart failure with potential risk for complications up to and including death with invasive dental procedures.  PLAN/RECOMMENDATIONS: 1. I discussed the risks, benefits, and complications of various treatment options with the patient in relationship to the medical and dental conditions with potential complications up to and including death due to his overall cardiovascular compromise. We discussed various treatment options to include no treatment, extraction of tooth #19 with alveoloplasty, followup with an endodontist as an outpatient for second opinion concerning potential endodontic treatment options for tooth #19, followup care with primary dentist of his choice for periodic oral examination, dental radiographs, periodontal therapy, dental restorations, root canal therapy, crown and bridge therapy, implant therapy, and replacement of missing teeth as indicated. The patient currently wishes to proceed with extraction of tooth #19 with alveoloplasty in the operating room as time and space permits.  Currently the patient is scheduled for Friday morning at 7:30 AM in the operating room. Heparin therapy will be discontinued after last dose today. Clindamycin IV antibiotic  therapy will be given as premedication prior to invasive dental procedures. Patient will then be discharged when medically stable with an additional 4-5 days of oral clindamycin therapy.   2. Discussion of findings with medical team and coordination of future medical and dental care.   Charlynne Pander, DDS

## 2013-01-25 NOTE — Progress Notes (Signed)
Pt stated he feels some discomfort when laying down pointing to epigastric area.  Stated he had to sit up   Noted 02 off.  Checked 02 97%% on RA.    Felizardo Hoffmann, RN informed.  Instructed will f/u on it.  Will continue to monitor.  Amanda Pea, Charity fundraiser.

## 2013-01-26 ENCOUNTER — Inpatient Hospital Stay (HOSPITAL_COMMUNITY): Payer: Medicaid Other | Admitting: Anesthesiology

## 2013-01-26 ENCOUNTER — Encounter (HOSPITAL_COMMUNITY): Payer: Self-pay | Admitting: Anesthesiology

## 2013-01-26 ENCOUNTER — Encounter (HOSPITAL_COMMUNITY): Payer: Self-pay | Admitting: Certified Registered Nurse Anesthetist

## 2013-01-26 ENCOUNTER — Encounter (HOSPITAL_COMMUNITY)
Admission: EM | Disposition: A | Discharge status: Home / Self Care | Payer: Self-pay | Source: Home / Self Care | Attending: Cardiology

## 2013-01-26 DIAGNOSIS — K045 Chronic apical periodontitis: Secondary | ICD-10-CM

## 2013-01-26 DIAGNOSIS — K122 Cellulitis and abscess of mouth: Secondary | ICD-10-CM

## 2013-01-26 DIAGNOSIS — K0401 Reversible pulpitis: Secondary | ICD-10-CM

## 2013-01-26 HISTORY — PX: MULTIPLE EXTRACTIONS WITH ALVEOLOPLASTY: SHX5342

## 2013-01-26 LAB — BASIC METABOLIC PANEL
BUN: 28 mg/dL — ABNORMAL HIGH (ref 6–23)
CO2: 23 mEq/L (ref 19–32)
Calcium: 9.4 mg/dL (ref 8.4–10.5)
Creatinine, Ser: 1.33 mg/dL (ref 0.50–1.35)
Glucose, Bld: 125 mg/dL — ABNORMAL HIGH (ref 70–99)

## 2013-01-26 LAB — SURGICAL PCR SCREEN: Staphylococcus aureus: POSITIVE — AB

## 2013-01-26 SURGERY — MULTIPLE EXTRACTION WITH ALVEOLOPLASTY
Anesthesia: Monitor Anesthesia Care | Site: Mouth | Wound class: Dirty or Infected

## 2013-01-26 MED ORDER — BUPIVACAINE-EPINEPHRINE 0.5% -1:200000 IJ SOLN
INTRAMUSCULAR | Status: DC | PRN
  Administered 2013-01-26: 1.8 mL

## 2013-01-26 MED ORDER — LIDOCAINE-EPINEPHRINE 2 %-1:100000 IJ SOLN
INTRAMUSCULAR | Status: DC | PRN
  Administered 2013-01-26: 1.7 mL

## 2013-01-26 MED ORDER — CARVEDILOL 3.125 MG PO TABS: 3.1250 mg | ORAL_TABLET | Freq: Two times a day (BID) | ORAL | Status: DC

## 2013-01-26 MED ORDER — LACTATED RINGERS IV SOLN: INTRAVENOUS | Status: DC

## 2013-01-26 MED ORDER — KETAMINE HCL 100 MG/ML IJ SOLN
INTRAMUSCULAR | Status: DC | PRN
  Administered 2013-01-26 (×3): 50 mg via INTRAVENOUS

## 2013-01-26 MED ORDER — OXYMETAZOLINE HCL 0.05 % NA SOLN
NASAL | Status: AC
  Filled 2013-01-26: qty 15

## 2013-01-26 MED ORDER — KETAMINE HCL 100 MG/ML IJ SOLN
INTRAMUSCULAR | Status: AC
  Filled 2013-01-26: qty 1

## 2013-01-26 MED ORDER — HYDROCODONE-ACETAMINOPHEN 5-325 MG PO TABS: 1.0000 | ORAL_TABLET | Freq: Four times a day (QID) | ORAL | Status: DC | PRN

## 2013-01-26 MED ORDER — FENTANYL CITRATE 0.05 MG/ML IJ SOLN
INTRAMUSCULAR | Status: DC | PRN
  Administered 2013-01-26: 25 ug via INTRAVENOUS

## 2013-01-26 MED ORDER — FENTANYL CITRATE 0.05 MG/ML IJ SOLN: 25.0000 ug | INTRAMUSCULAR | Status: DC | PRN

## 2013-01-26 MED ORDER — POTASSIUM CHLORIDE CRYS ER 20 MEQ PO TBCR
20.0000 meq | EXTENDED_RELEASE_TABLET | Freq: Every day | ORAL | Status: DC
  Administered 2013-01-26: 20 meq via ORAL
  Filled 2013-01-26: qty 1

## 2013-01-26 MED ORDER — HYDRALAZINE HCL 25 MG PO TABS: 37.5000 mg | ORAL_TABLET | Freq: Three times a day (TID) | ORAL | Status: DC

## 2013-01-26 MED ORDER — LIDOCAINE-EPINEPHRINE 2 %-1:100000 IJ SOLN
INTRAMUSCULAR | Status: AC
  Filled 2013-01-26: qty 10.2

## 2013-01-26 MED ORDER — ISOSORBIDE MONONITRATE ER 30 MG PO TB24: 30.0000 mg | ORAL_TABLET | Freq: Every day | ORAL | Status: DC

## 2013-01-26 MED ORDER — LACTATED RINGERS IV SOLN
INTRAVENOUS | Status: DC | PRN
  Administered 2013-01-26: 07:00:00 via INTRAVENOUS

## 2013-01-26 MED ORDER — LISINOPRIL 5 MG PO TABS: 5.0000 mg | ORAL_TABLET | Freq: Every day | ORAL | Status: DC

## 2013-01-26 MED ORDER — MIDAZOLAM HCL 5 MG/5ML IJ SOLN
INTRAMUSCULAR | Status: DC | PRN
  Administered 2013-01-26: 2 mg via INTRAVENOUS

## 2013-01-26 MED ORDER — CLINDAMYCIN HCL 150 MG PO CAPS: 150.0000 mg | ORAL_CAPSULE | Freq: Three times a day (TID) | ORAL | Status: DC

## 2013-01-26 MED ORDER — PROMETHAZINE HCL 25 MG/ML IJ SOLN: 6.2500 mg | INTRAMUSCULAR | Status: DC | PRN

## 2013-01-26 MED ORDER — BUPIVACAINE-EPINEPHRINE PF 0.5-1:200000 % IJ SOLN
INTRAMUSCULAR | Status: AC
  Filled 2013-01-26: qty 10.8

## 2013-01-26 MED ORDER — POTASSIUM CHLORIDE CRYS ER 20 MEQ PO TBCR
40.0000 meq | EXTENDED_RELEASE_TABLET | Freq: Every day | ORAL | Status: DC
  Filled 2013-01-26: qty 2

## 2013-01-26 MED ORDER — LACTATED RINGERS IV SOLN
INTRAVENOUS | Status: DC
  Administered 2013-01-26: 10 mL/h via INTRAVENOUS

## 2013-01-26 MED ORDER — POTASSIUM CHLORIDE CRYS ER 20 MEQ PO TBCR: 20.0000 meq | EXTENDED_RELEASE_TABLET | Freq: Every day | ORAL | Status: DC

## 2013-01-26 MED ORDER — MEPERIDINE HCL 25 MG/ML IJ SOLN: 6.2500 mg | INTRAMUSCULAR | Status: DC | PRN

## 2013-01-26 SURGICAL SUPPLY — 37 items
ALCOHOL 70% 16 OZ (MISCELLANEOUS) ×2 IMPLANT
ATTRACTOMAT 16X20 MAGNETIC DRP (DRAPES) ×2 IMPLANT
BLADE SURG 15 STRL LF DISP TIS (BLADE) ×2 IMPLANT
BLADE SURG 15 STRL SS (BLADE) ×4
CLOTH BEACON ORANGE TIMEOUT ST (SAFETY) ×2 IMPLANT
COVER SURGICAL LIGHT HANDLE (MISCELLANEOUS) ×2 IMPLANT
CRADLE DONUT ADULT HEAD (MISCELLANEOUS) ×2 IMPLANT
GAUZE PACKING FOLDED 2  STR (GAUZE/BANDAGES/DRESSINGS) ×1
GAUZE PACKING FOLDED 2 STR (GAUZE/BANDAGES/DRESSINGS) ×1 IMPLANT
GAUZE SPONGE 4X4 16PLY XRAY LF (GAUZE/BANDAGES/DRESSINGS) ×2 IMPLANT
GLOVE SURG ORTHO 8.0 STRL STRW (GLOVE) ×2 IMPLANT
GLOVE SURG SS PI 6.5 STRL IVOR (GLOVE) ×2 IMPLANT
GOWN STRL REIN 3XL LVL4 (GOWN DISPOSABLE) ×2 IMPLANT
HEMOSTAT SURGICEL .5X2 ABSORB (HEMOSTASIS) IMPLANT
KIT BASIN OR (CUSTOM PROCEDURE TRAY) ×2 IMPLANT
KIT ROOM TURNOVER OR (KITS) ×2 IMPLANT
MANIFOLD NEPTUNE WASTE (CANNULA) ×1 IMPLANT
NDL BLUNT 16X1.5 OR ONLY (NEEDLE) ×1 IMPLANT
NDL DENTAL 27 LONG (NEEDLE) IMPLANT
NEEDLE BLUNT 16X1.5 OR ONLY (NEEDLE) ×2 IMPLANT
NEEDLE DENTAL 27 LONG (NEEDLE) ×4 IMPLANT
NS IRRIG 1000ML POUR BTL (IV SOLUTION) ×2 IMPLANT
PACK EENT II TURBAN DRAPE (CUSTOM PROCEDURE TRAY) ×2 IMPLANT
PAD ARMBOARD 7.5X6 YLW CONV (MISCELLANEOUS) ×4 IMPLANT
SPONGE GAUZE 4X4 12PLY (GAUZE/BANDAGES/DRESSINGS) ×1 IMPLANT
SPONGE SURGIFOAM ABS GEL 100 (HEMOSTASIS) IMPLANT
SPONGE SURGIFOAM ABS GEL 12-7 (HEMOSTASIS) IMPLANT
SPONGE SURGIFOAM ABS GEL SZ50 (HEMOSTASIS) IMPLANT
SUCTION FRAZIER TIP 10 FR DISP (SUCTIONS) ×2 IMPLANT
SUT CHROMIC 3 0 PS 2 (SUTURE) ×4 IMPLANT
SUT CHROMIC 4 0 P 3 18 (SUTURE) IMPLANT
SYR 50ML SLIP (SYRINGE) ×2 IMPLANT
TOWEL OR 17X24 6PK STRL BLUE (TOWEL DISPOSABLE) ×2 IMPLANT
TOWEL OR 17X26 10 PK STRL BLUE (TOWEL DISPOSABLE) ×2 IMPLANT
TUBE CONNECTING 12X1/4 (SUCTIONS) ×2 IMPLANT
WATER STERILE IRR 1000ML POUR (IV SOLUTION) ×2 IMPLANT
YANKAUER SUCT BULB TIP NO VENT (SUCTIONS) ×2 IMPLANT

## 2013-01-26 NOTE — Progress Notes (Signed)
Pt given DC instructions and verbalized understanding.  Pt DC home via wc.  

## 2013-01-26 NOTE — Op Note (Signed)
Patient:            Douglas [name] Date of Birth:  08-12-1976 MRN:                [id number]   DATE OF PROCEDURE:  01/26/2013               OPERATIVE REPORT   PREOPERATIVE DIAGNOSES: 1. Acute on chronic heart failure 2. History of acute pulpitis 3. Chronic apical periodontitis 4. Buccal abscess #19  POSTOPERATIVE DIAGNOSES: 1. Acute on chronic heart failure 2. History of acute pulpitis 3. Chronic apical periodontitis 4. Buccal abscess #19  OPERATIONS: 1. Surgical extraction of tooth #19 with alveoloplasty    SURGEON: Charlynne Pander, DDS  ASSISTANT: Zettie Pho, (dental assistant)  ANESTHESIA: Monitored anesthesia care per the anesthesia team   MEDICATIONS: 1. Clindamycin 600 mg IV prior to invasive dental procedures  2. Local anesthesia with a total utilization of 1 carpule each containing 34 mg of lidocaine with 0.017 mg of epinephrine as well as 1 carpule each containing 9 mg of bupivacaine with 0.009 mg of epinephrine.  SPECIMENS: There are 1 tooth that was discarded.  DRAINS: None  CULTURES: None  COMPLICATIONS: None   ESTIMATED BLOOD LOSS: Less than 10 mL.  INTRAVENOUS FLUIDS: Per anesthesia team record  INDICATIONS: The patient was recently admitted and diagnosed with acute on chronic heart failure.  A dental consultation was then requested to evaluate history of toothache and left facial swelling.  The patient was examined and treatment planned for extraction of tooth #19 with alveoloplasty as indicated.  This treatment plan was formulated to decrease the risks and complications associated with dental infection from affecting the patient's systemic health.  OPERATIVE FINDINGS: Patient was examined operating room number 2.  Tooth #19 was identified for extraction. The patient was noted be affected by history of acute pulpitis, buccal abscess #19, and chronic apical periodontitis #19.   DESCRIPTION OF PROCEDURE: Patient was brought to the  main operating room number 2. Patient was then placed in the supine position on the operating table. Monitored anesthesia care was then induced per the anesthesia team. The patient was then prepped and draped in the usual manner for dental medicine procedure. A timeout was performed. The patient was identified and procedures were verified.  The oral cavity was then thoroughly examined with the findings noted above. The patient was then ready for dental medicine procedure as follows:  Local anesthesia was then administered with a total utilization of 1 carpule each containing 34 mg of lidocaine with 0.017 mg of epinephrine as well as  1 carpule  each containing 9 mg bupivacaine with 0.009 mg of epinephrine.  At this point time, the the mandibular left quadrant was approached. The patient was a mandibular left inferior alveolar nerve block and long buccal nerve block utilizing the bupivacaine with epinephrine. Further infiltration was then achieved utilizing the lidocaine with epinephrine. A 15 blade incision was then made in a sulcular fashion around tooth #19. Appropriate amounts of buccal and interseptal bone was removed with a surgical handpiece and bur and copious amounts of sterile water. Tooth #19 was then subluxated with a series of straight elevators. Tooth #19 was then removed with a 23 forceps without complications. Alveoloplasty was performed utilizing a rongeur and bone file. The socket was then curetted and compressed appropriately. The surgical site was then irrigated with copious amounts sterile saline. The surgical site was then closed loosely utilizing a figure-of-eight suture technique and 3-0  chromic gut material. At this point time, a series of 4 x 4 gauzes were placed in the mouth to aid hemostasis. The patient was examined for complications, seeing none, the dental medicine procedure was deemed to be complete. The patient was then handed over to the anesthesia team for final disposition.  After an appropriate amount of time, the patient was taken to the postanesthsia care unit in good condition. All counts were correct for the dental medicine procedure.  The patient will be seen in approximately 10 days for evaluation of healing. The patient can be discharged at the discretion of the cardiology team with an additional 4-5 days of oral clindamycin antibiotic therapy.  Charlynne Pander, DDS.

## 2013-01-26 NOTE — Transfer of Care (Signed)
Immediate Anesthesia Transfer of Care Note  Patient: Douglas [name]  Procedure(s) Performed: Procedure(s):  EXTRACION  TOOTH # 19 WITH ALVEOLOPLASTY (N/A)  Patient Location: PACU  Anesthesia Type:MAC  Level of Consciousness: awake, alert  and patient cooperative  Airway & Oxygen Therapy: Patient Spontanous Breathing and Patient connected to face mask oxygen  Post-op Assessment: Report given to PACU RN, Post -op Vital signs reviewed and stable and Patient moving all extremities X 4  Post vital signs: Reviewed and stable  Complications: No apparent anesthesia complications

## 2013-01-26 NOTE — Progress Notes (Signed)
Subjective:   I/Os: -1.3 L.  Weight unchanged. Yesterday hydralazine increased to 37.5 mg tid and Milrinone weaned to 0.125 mcg.  Cr 1.59>>1.32> 1.39>1.35   01/24/13 Started on Peridex, magic mouthwash and clindamycin. 01/24/13. Orthopanoramic- No abscess or peridontal disease. Yesterday Milrinone was stopped and he was given extra  40 mg of lasix. Weight unchanged. Overall weight down 7 pounds.   01/25/13 S/P tooth extraction #19   Denies SOB/PND/Orthopnea     Objective:  Filed Vitals:   01/26/13 0841 01/26/13 0845 01/26/13 0854 01/26/13 0910  BP: 143/88  130/76   Pulse: 58 56 53   Temp:    97 F (36.1 C)  TempSrc:      Resp: 35 48 33   Height:      Weight:      SpO2: 100% 100% 97%     Intake/Output from previous day:  Intake/Output Summary (Last 24 hours) at 01/26/13 0940 Last data filed at 01/26/13 0930  Gross per 24 hour  Intake 1362.5 ml  Output   2650 ml  Net -1287.5 ml    Physical Exam: Physical exam: Well-developed well-nourished in no acute distress.  Skin is warm and dry.  HEENT is normal.  Neck is supple. JVP 7.  Chest is clear to auscultation with normal expansion.  Cardiovascular exam is regular rate and rhythm.  + soft s3 Abdominal exam nontender or distended. No masses palpated. Extremities   No RLE LLE . Bilateral ted hose Neuro grossly intact    Lab Results: Basic Metabolic Panel:  Recent Labs  09/81/19 1400 01/25/13 1643 01/26/13 0605  NA  --  128* 132*  K  --  5.6* 4.9  CL  --  96 95*  CO2  --  22 23  GLUCOSE  --  150* 125*  BUN  --  24* 28*  CREATININE  --  1.38* 1.33  CALCIUM  --  9.2 9.4  MG 2.3  --   --    CBC:  Recent Labs  01/25/13 1643  WBC 9.5  HGB 13.8  HCT 39.1  MCV 84.6  PLT 319   Cardiac Enzymes: No results found for this basename: CKTOTAL, CKMB, CKMBINDEX, TROPONINI,  in the last 72 hours  Scheduled Meds: . [facility] HOLD] aspirin EC  81 mg Oral Daily  . [facility] HOLD] carvedilol  3.125 mg Oral BID WC  .  [facility] HOLD] chlorhexidine  15 mL Mouth/Throat QID  . [facility] HOLD] clindamycin  150 mg Oral Q8H  . [facility] HOLD] furosemide  40 mg Oral BID  . [[facility] HOLD] hydrALAZINE  37.5 mg Oral Q8H  . [[facility] HOLD] isosorbide mononitrate  30 mg Oral Daily  . ketamine      . [[facility] HOLD] lisinopril  5 mg Oral Daily  . [facility] HOLD] potassium chloride  40 mEq Oral BID  . [facility] HOLD] sodium chloride  3 mL Intravenous Q12H   Continuous Infusions: . lactated ringers 1,000 mL (01/25/13 1509)  . lactated ringers     PRN Meds:.[[facility] HOLD] sodium chloride, [[facility] HOLD] acetaminophen, [[facility] HOLD] albuterol, fentaNYL, fentaNYL, [[facility] HOLD] HYDROcodone-acetaminophen, meperidine (DEMEROL) injection, midazolam, [[facility] HOLD] ondansetron (ZOFRAN) IV, promethazine, [[facility] HOLD] sodium chloride, [MAR HOLD] zolpidem Assessment:   1. A/C systolic HF with low output      --NICM EF 15% with RV dysfunction as well 2. Tobacco use 3. Non-compliance 4. A/C renal failure 5. NSVT 6. Hypokalemia 7. Tooth Abscess S/P Tooth extraction # 19 01/26/13  PLAN/DISCUSSION: Volume status  stable. Weight unchanged 268 pounds. Overall weight down 7 pounds.  Continue current HF medications. Cut back potassium to 20 meq daily. Follow up next week in the HF clinic.    Day 2/5 clindamycin for total of 5 days. Appreciate Dr Chucky May assistance.   Tonye Becket, NP-C  Patient seen and examined with Tonye Becket, NP. We discussed all aspects of the encounter. I agree with the assessment and plan as stated above.   HF much improved. Ready for d/c. Long discussion about need for compliance. Continue clinda for oral abscess. F/u Next week with BMET.  Daniel Bensimhon,MD 2:22 PM

## 2013-01-26 NOTE — Progress Notes (Signed)
PRE-OPERATIVE NOTE:  01/26/2013 Douglas [name] [id number]  VITALS: BP 120/83  Pulse 102  Temp(Src) 97.1 F (36.2 C) (Oral)  Resp 20  Ht 6\' 4"  (1.93 m)  Wt 268 lb 4.8 oz (121.7 kg)  BMI 32.67 kg/m2  SpO2 94%  Lab Results  Component Value Date   WBC 9.5 01/25/2013   HGB 13.8 01/25/2013   HCT 39.1 01/25/2013   MCV 84.6 01/25/2013   PLT 319 01/25/2013   BMET    Component Value Date/Time   NA 128* 01/25/2013 1643   K 5.6* 01/25/2013 1643   CL 96 01/25/2013 1643   CO2 22 01/25/2013 1643   GLUCOSE 150* 01/25/2013 1643   BUN 24* 01/25/2013 1643   CREATININE 1.38* 01/25/2013 1643   CALCIUM 9.2 01/25/2013 1643   GFRNONAA 64* 01/25/2013 1643   GFRAA 74* 01/25/2013 1643    Lab Results  Component Value Date   INR 1.15 06/22/2012   INR 1.27 09/20/2011   No results found for this basename: PTT     Douglas [name] presents for dental extraction of tooth # 19 with alveoloplasty in the operating room. Decision has been made by anesthesia, patient and dentist to provide dental treatment with IV sedation with local anesthesia int he OR today.   SUBJECTIVE: The patient denies any acute medical or dental changes and agrees to proceed with treatment as planned.  EXAM: No sign of acute dental changes.  ASSESSMENT: Patient is affected by history of acute pulpitis, buccal abscess, and periapical pathology #19.  PLAN: Patient agrees to proceed with treatment as planned in the operating room as previously discussed and accepts the risks, benefits, complications of the proposed treatment.   Charlynne Pander, DDS

## 2013-01-26 NOTE — Anesthesia Preprocedure Evaluation (Addendum)
Anesthesia Evaluation  Patient identified by MRN, date of birth, ID band Patient awake    Reviewed: Allergy & Precautions, H&P , NPO status , Patient's Chart, lab work & pertinent test results  Airway Mallampati: II TM Distance: >3 FB Neck ROM: Full    Dental  (+) Dental Advisory Given   Pulmonary Current Smoker,          Cardiovascular hypertension, Pt. on medications +CHF + dysrhythmias (NSVT) Ventricular Tachycardia  NICM EF 15%   Neuro/Psych Anxiety negative neurological ROS  negative psych ROS   GI/Hepatic negative GI ROS, Neg liver ROS,   Endo/Other  diabetesMorbid obesity  Renal/GU negative Renal ROS  negative genitourinary   Musculoskeletal negative musculoskeletal ROS (+)   Abdominal   Peds negative pediatric ROS (+)  Hematology negative hematology ROS (+)   Anesthesia Other Findings   Reproductive/Obstetrics negative OB ROS                        Anesthesia Physical Anesthesia Plan  ASA: IV  Anesthesia Plan: MAC   Post-op Pain Management:    Induction: Intravenous  Airway Management Planned: Simple Face Mask  Additional Equipment:   Intra-op Plan:   Post-operative Plan: Extubation in OR  Informed Consent: I have reviewed the patients History and Physical, chart, labs and discussed the procedure including the risks, benefits and alternatives for the proposed anesthesia with the patient or authorized representative who has indicated his/her understanding and acceptance.   Dental advisory given  Plan Discussed with: CRNA, Anesthesiologist and Surgeon  Anesthesia Plan Comments: (Spoke with Dr Kristin Bruins. He agrees to [facility] with local as opposed to [place] to minimize risk)      Anesthesia Quick Evaluation

## 2013-01-26 NOTE — Preoperative (Signed)
Beta Blockers   Reason not to administer Beta Blockers:Not Applicable 

## 2013-01-26 NOTE — Anesthesia Postprocedure Evaluation (Signed)
Anesthesia Post Note  Patient: Douglas [name]  Procedure(s) Performed: Procedure(s) (LRB):  EXTRACION  TOOTH # 19 WITH ALVEOLOPLASTY (N/A)  Anesthesia type: MAC  Patient location: PACU  Post pain: Pain level controlled  Post assessment: Patient's Cardiovascular Status Stable  Last Vitals:  Filed Vitals:   01/26/13 0845  BP:   Pulse: 56  Temp:   Resp: 48    Post vital signs: Reviewed and stable  Level of consciousness: sedated  Complications: No apparent anesthesia complications

## 2013-01-27 ENCOUNTER — Encounter (HOSPITAL_COMMUNITY): Payer: Self-pay | Admitting: Dentistry

## 2013-01-29 NOTE — Progress Notes (Signed)
Agree.  Douglas [name] 9:23 PM

## 2013-01-31 ENCOUNTER — Encounter (HOSPITAL_COMMUNITY): Payer: Self-pay

## 2013-02-02 ENCOUNTER — Telehealth (HOSPITAL_COMMUNITY): Payer: Self-pay | Admitting: Dental General Practice

## 2013-02-02 NOTE — Telephone Encounter (Signed)
Dental Medicine called and left a message for patient to for S/P surgical/suture removal appointment. Will wait for patient to return the call. djs

## 2013-02-07 ENCOUNTER — Other Ambulatory Visit (HOSPITAL_COMMUNITY): Payer: Self-pay | Admitting: Dentistry

## 2013-02-20 ENCOUNTER — Inpatient Hospital Stay (HOSPITAL_COMMUNITY)
Admission: AD | Admit: 2013-02-20 | Discharge: 2013-02-26 | DRG: 287 | Disposition: A | Discharge status: Home / Self Care | Payer: Medicaid Other | Source: Ambulatory Visit | Attending: Internal Medicine | Admitting: Internal Medicine

## 2013-02-20 ENCOUNTER — Ambulatory Visit (HOSPITAL_BASED_OUTPATIENT_CLINIC_OR_DEPARTMENT_OTHER)
Admission: RE | Admit: 2013-02-20 | Discharge: 2013-02-20 | Disposition: A | Discharge status: Home / Self Care | Payer: Medicaid Other | Source: Ambulatory Visit | Attending: Internal Medicine | Admitting: Internal Medicine

## 2013-02-20 ENCOUNTER — Encounter (HOSPITAL_COMMUNITY): Payer: Self-pay | Admitting: General Practice

## 2013-02-20 VITALS — BP 102/68 | HR 110 | Wt 280.5 lb

## 2013-02-20 DIAGNOSIS — Z8249 Family history of ischemic heart disease and other diseases of the circulatory system: Secondary | ICD-10-CM

## 2013-02-20 DIAGNOSIS — R0609 Other forms of dyspnea: Secondary | ICD-10-CM | POA: Diagnosis present

## 2013-02-20 DIAGNOSIS — I5022 Chronic systolic (congestive) heart failure: Secondary | ICD-10-CM | POA: Diagnosis present

## 2013-02-20 DIAGNOSIS — F17200 Nicotine dependence, unspecified, uncomplicated: Secondary | ICD-10-CM | POA: Diagnosis present

## 2013-02-20 DIAGNOSIS — Z7982 Long term (current) use of aspirin: Secondary | ICD-10-CM

## 2013-02-20 DIAGNOSIS — I4729 Other ventricular tachycardia: Secondary | ICD-10-CM | POA: Diagnosis present

## 2013-02-20 DIAGNOSIS — I129 Hypertensive chronic kidney disease with stage 1 through stage 4 chronic kidney disease, or unspecified chronic kidney disease: Secondary | ICD-10-CM | POA: Diagnosis present

## 2013-02-20 DIAGNOSIS — R0989 Other specified symptoms and signs involving the circulatory and respiratory systems: Secondary | ICD-10-CM | POA: Diagnosis present

## 2013-02-20 DIAGNOSIS — Z801 Family history of malignant neoplasm of trachea, bronchus and lung: Secondary | ICD-10-CM

## 2013-02-20 DIAGNOSIS — Z833 Family history of diabetes mellitus: Secondary | ICD-10-CM

## 2013-02-20 DIAGNOSIS — E669 Obesity, unspecified: Secondary | ICD-10-CM | POA: Diagnosis present

## 2013-02-20 DIAGNOSIS — E871 Hypo-osmolality and hyponatremia: Secondary | ICD-10-CM | POA: Diagnosis present

## 2013-02-20 DIAGNOSIS — Z91199 Patient's noncompliance with other medical treatment and regimen due to unspecified reason: Secondary | ICD-10-CM

## 2013-02-20 DIAGNOSIS — I428 Other cardiomyopathies: Secondary | ICD-10-CM | POA: Diagnosis present

## 2013-02-20 DIAGNOSIS — E876 Hypokalemia: Secondary | ICD-10-CM | POA: Diagnosis present

## 2013-02-20 DIAGNOSIS — N189 Chronic kidney disease, unspecified: Secondary | ICD-10-CM | POA: Diagnosis present

## 2013-02-20 DIAGNOSIS — Z9119 Patient's noncompliance with other medical treatment and regimen: Secondary | ICD-10-CM

## 2013-02-20 DIAGNOSIS — I472 Ventricular tachycardia, unspecified: Secondary | ICD-10-CM | POA: Diagnosis present

## 2013-02-20 DIAGNOSIS — E785 Hyperlipidemia, unspecified: Secondary | ICD-10-CM | POA: Diagnosis present

## 2013-02-20 DIAGNOSIS — I5023 Acute on chronic systolic (congestive) heart failure: Principal | ICD-10-CM | POA: Diagnosis present

## 2013-02-20 DIAGNOSIS — Z880 Allergy status to penicillin: Secondary | ICD-10-CM

## 2013-02-20 DIAGNOSIS — N529 Male erectile dysfunction, unspecified: Secondary | ICD-10-CM | POA: Diagnosis present

## 2013-02-20 DIAGNOSIS — R0601 Orthopnea: Secondary | ICD-10-CM | POA: Diagnosis present

## 2013-02-20 DIAGNOSIS — I509 Heart failure, unspecified: Secondary | ICD-10-CM | POA: Diagnosis present

## 2013-02-20 LAB — COMPREHENSIVE METABOLIC PANEL
Alkaline Phosphatase: 73 U/L (ref 39–117)
BUN: 15 mg/dL (ref 6–23)
CO2: 23 mEq/L (ref 19–32)
Chloride: 102 mEq/L (ref 96–112)
GFR calc Af Amer: 82 mL/min — ABNORMAL LOW (ref >90)
GFR calc non Af Amer: 71 mL/min — ABNORMAL LOW (ref >90)
Glucose, Bld: 122 mg/dL — ABNORMAL HIGH (ref 70–99)
Potassium: 4.1 mEq/L (ref 3.5–5.1)
Total Bilirubin: 1.5 mg/dL — ABNORMAL HIGH (ref 0.3–1.2)

## 2013-02-20 LAB — CBC
HCT: 38.3 % — ABNORMAL LOW (ref 39.0–52.0)
MCHC: 34.2 g/dL (ref 30.0–36.0)
RDW: 14.6 % (ref 11.5–15.5)

## 2013-02-20 LAB — PRO B NATRIURETIC PEPTIDE: Pro B Natriuretic peptide (BNP): 2002 pg/mL — ABNORMAL HIGH (ref 0–125)

## 2013-02-20 MED ORDER — HYDRALAZINE HCL 25 MG PO TABS
25.0000 mg | ORAL_TABLET | Freq: Three times a day (TID) | ORAL | Status: DC
  Administered 2013-02-20 – 2013-02-26 (×17): 25 mg via ORAL
  Filled 2013-02-20 (×21): qty 1

## 2013-02-20 MED ORDER — LISINOPRIL 5 MG PO TABS
5.0000 mg | ORAL_TABLET | Freq: Every day | ORAL | Status: DC
  Administered 2013-02-20 – 2013-02-26 (×7): 5 mg via ORAL
  Filled 2013-02-20 (×7): qty 1

## 2013-02-20 MED ORDER — ASPIRIN EC 81 MG PO TBEC
81.0000 mg | DELAYED_RELEASE_TABLET | Freq: Every day | ORAL | Status: DC
  Administered 2013-02-20 – 2013-02-26 (×7): 81 mg via ORAL
  Filled 2013-02-20 (×7): qty 1

## 2013-02-20 MED ORDER — CARVEDILOL 3.125 MG PO TABS
3.1250 mg | ORAL_TABLET | Freq: Two times a day (BID) | ORAL | Status: DC
  Administered 2013-02-20 – 2013-02-24 (×8): 3.125 mg via ORAL
  Filled 2013-02-20 (×11): qty 1

## 2013-02-20 MED ORDER — SODIUM CHLORIDE 0.9 % IJ SOLN
3.0000 mL | Freq: Two times a day (BID) | INTRAMUSCULAR | Status: DC
  Administered 2013-02-20 – 2013-02-26 (×10): 3 mL via INTRAVENOUS

## 2013-02-20 MED ORDER — FUROSEMIDE 10 MG/ML IJ SOLN
80.0000 mg | Freq: Two times a day (BID) | INTRAMUSCULAR | Status: DC
  Administered 2013-02-20 – 2013-02-23 (×6): 80 mg via INTRAVENOUS
  Filled 2013-02-20 (×7): qty 8

## 2013-02-20 MED ORDER — FUROSEMIDE 10 MG/ML IJ SOLN
INTRAMUSCULAR | Status: AC
  Filled 2013-02-20: qty 8

## 2013-02-20 MED ORDER — SODIUM CHLORIDE 0.9 % IV SOLN: 250.0000 mL | INTRAVENOUS | Status: DC | PRN

## 2013-02-20 MED ORDER — ONDANSETRON HCL 4 MG/2ML IJ SOLN: 4.0000 mg | Freq: Four times a day (QID) | INTRAMUSCULAR | Status: DC | PRN

## 2013-02-20 MED ORDER — SODIUM CHLORIDE 0.9 % IJ SOLN
3.0000 mL | INTRAMUSCULAR | Status: DC | PRN
  Administered 2013-02-22: 3 mL via INTRAVENOUS

## 2013-02-20 MED ORDER — ACETAMINOPHEN 325 MG PO TABS: 650.0000 mg | ORAL_TABLET | ORAL | Status: DC | PRN

## 2013-02-20 MED ORDER — HEPARIN SODIUM (PORCINE) 5000 UNIT/ML IJ SOLN
5000.0000 [IU] | Freq: Three times a day (TID) | INTRAMUSCULAR | Status: DC
  Administered 2013-02-20 – 2013-02-22 (×5): 5000 [IU] via SUBCUTANEOUS
  Filled 2013-02-20 (×8): qty 1

## 2013-02-20 MED ORDER — ISOSORBIDE MONONITRATE ER 30 MG PO TB24
30.0000 mg | ORAL_TABLET | Freq: Every day | ORAL | Status: DC
  Administered 2013-02-20 – 2013-02-26 (×7): 30 mg via ORAL
  Filled 2013-02-20 (×7): qty 1

## 2013-02-20 MED ORDER — POTASSIUM CHLORIDE CRYS ER 20 MEQ PO TBCR
20.0000 meq | EXTENDED_RELEASE_TABLET | Freq: Two times a day (BID) | ORAL | Status: DC
  Administered 2013-02-20 – 2013-02-23 (×7): 20 meq via ORAL
  Filled 2013-02-20 (×9): qty 1

## 2013-02-20 NOTE — Assessment & Plan Note (Signed)
Patient presents with volume overload and symptoms concerning for low output heart failure.  Will admit for IV diuresis.  Patient will most likely need inotrope support for diuresis.  Will have low threshold to proceed with RHC for hemodynamic monitoring.  This has been discussed with the patient and he agrees to the plan.

## 2013-02-20 NOTE — H&P (Signed)
Advanced Heart Failure   HPI: Douglas [name] is a 37 y.o. male with a h/o HTN, HL, obesity and CHF due to NICM. Management has been complicated by severe non-compliance.   Underwent cath in 2006 which showed normal cors with EF 20-25%. EF had recovered in late 2013 but has again fallen to 15% per echo 01/20/13.   August 2012 CPX text VO2 20   He was recently admitted for heart failure exacerbation with low output symptoms. Repeat echo showed decline in EF from 55% to 15%. Discharge weight was 268 pounds. He states that he only felt well for approximately 1 day after discharge and then he began to swell again and has c/o progressive dyspnea. He +orthopnea and PND. He hasn't been sleeping because he can't sleep sitting up. He says he can't eat due to feeling so full. Urine output has declined. Weight is up 12 pounds in the last 2 weeks. .     Review of Systems:     Cardiac Review of Systems: {Y] = yes [ ]  = no  Chest Pain [    ]  Resting SOB [  Y ] Exertional SOB  [ Y ]  Orthopnea [ Y ]   Pedal Edema [   ]    Palpitations [  ] Syncope  [  ]   Presyncope [   ]  General Review of Systems: [Y] = yes [  ]=no Constitional: recent weight change [Y  ]; anorexia [  ]; fatigue [Y  ]; nausea [  ]; night sweats [  ]; fever [  ]; or chills [  ];                                                                                                                                          Dental: poor dentition[  ]; Last Dentist visit:   Eye : blurred vision [  ]; diplopia [   ]; vision changes [  ];  Amaurosis fugax[  ]; Resp: cough [  ];  wheezing[  ];  hemoptysis[  ]; shortness of breath[  ]; paroxysmal nocturnal dyspnea[  ]; dyspnea on exertion[  ]; or orthopnea[  ];  GI:  gallstones[  ], vomiting[  ];  dysphagia[  ]; melena[  ];  hematochezia [  ]; heartburn[  ];   Hx of  Colonoscopy[  ]; GU: kidney stones [  ]; hematuria[  ];   dysuria [  ];  nocturia[  ];  history of     obstruction [  ];   Skin: rash, swelling[  ];, hair loss[  ];  peripheral edema[  ];  or itching[  ]; Musculosketetal: myalgias[  ];  joint swelling[  ];  joint erythema[  ];  joint pain[  ];  back pain[  ];  Heme/Lymph: bruising[  ];  bleeding[  ];  anemia[  ];  Neuro: TIA[  ];  headaches[  ];  stroke[  ];  vertigo[  ];  seizures[  ];   paresthesias[  ];  difficulty walking[  ];  Psych:depression[  ]; anxiety[  ];  Endocrine: diabetes[  ];  thyroid dysfunction[  ];  Immunizations: Flu [  ]; Pneumococcal[  ];  Other:  Past Medical History  Diagnosis Date  . Hypertension   . Hyperlipidemia   . Systolic CHF, chronic   . Obesity   . Nonischemic cardiomyopathy     a.  echo 4/06: EF 30%, mild to mod MR, mild RAE, inf HK, lat HK , ant HK;    b.  cath 4/06: no CAD, EF 20-25%  . ED (erectile dysfunction)   . NSVT (nonsustained ventricular tachycardia)     eval by EP in past; no ICD candidate due to NYHA 1 symptoms    Medications Prior to Admission  Medication Sig Dispense Refill  . aspirin EC 81 MG EC tablet Take 1 tablet (81 mg total) by mouth daily.      . carvedilol (COREG) 3.125 MG tablet Take 1 tablet (3.125 mg total) by mouth 2 (two) times daily with a meal.  60 tablet  3  . furosemide (LASIX) 80 MG tablet Take 1 tablet (80 mg total) by mouth 2 (two) times daily.  60 tablet  1  . hydrALAZINE (APRESOLINE) 25 MG tablet Take 1.5 tablets (37.5 mg total) by mouth every 8 (eight) hours.  135 tablet  3  . HYDROcodone-acetaminophen (NORCO/VICODIN) 5-325 MG per tablet Take 1-2 tablets by mouth every 6 (six) hours as needed.  20 tablet  0  . isosorbide mononitrate (IMDUR) 30 MG 24 hr tablet Take 1 tablet (30 mg total) by mouth daily.  30 tablet  3  . lisinopril (PRINIVIL,ZESTRIL) 5 MG tablet Take 1 tablet (5 mg total) by mouth daily.  30 tablet  3  . potassium chloride SA (K-DUR,KLOR-CON) 20 MEQ tablet Take 1 tablet (20 mEq total) by mouth daily.  30 tablet  3     Allergies  Allergen Reactions  . Penicillins  Other (See Comments)    Unknown childhood reaction     History   Social History  . Marital Status: Legally Separated    Spouse Name: N/A    Number of Children: 5  . Years of Education: N/A   Occupational History  . unemployed    Social History Main Topics  . Smoking status: Current Every Day Smoker -- 0.30 packs/day for 4 years    Types: Cigarettes    Last Attempt to Quit: 06/21/2012  . Smokeless tobacco: Never Used  . Alcohol Use: 3.6 oz/week    6 Cans of beer per week     Comment: 06/22/2012 "maybe 6 pk beer/wk"  . Drug Use: Yes    Special: Cocaine     Comment: 06/22/2012 "last cocaine ~ 6 months ago"  Last use of cocaine in October 2013  . Sexually Active: Yes   Other Topics Concern  . Not on file   Social History Narrative  . No narrative on file    Family History  Problem Relation Age of Onset  . Coronary artery disease Mother 63    s/p PCI  . Lung cancer Father   . Diabetes type II Maternal Uncle   . Coronary artery disease Maternal Uncle     PHYSICAL EXAM: There were no vitals filed for this visit. Well nourished, well developed, in no acute distress  HEENT: normal  Neck: JVP to jaw. Carotids 2+ bilaterally.No Bruits  Cardiac: PMI laterally displaced. Tachycardic with regular rhythm, +S3  Lungs: clear to auscultation bilaterally, no wheezing, rhonchi or rales  Ab: obese. Soft NT. + distention. No bruits..  Ext warm. no cyanosis or clubbing, 2+ bilateral lower extremity edema  Skin: warm and dry  Psych:normal affect. CNII-XII grossly intact  No results found for this or any previous visit (from the past 24 hour(s)). No results found.   ASSESSMENT: 1. A/C systolic heart failure - ECHO 11/6107 EF 15% 2. NSVT  3. Chronic Renal Failure - creatinine baseline 1.3-1.4 4. Tobacco Abuse 5. Noncompliance   PLAN/DISCUSSION:  [name] was admitted from [facility] today with NYHA class IV symptoms. Volume status up at least 12 pounds  from baseline (268 pounds) Plan to diurese with IV lasix. If he has poor urine output will need RHC. May ultimately need Milrinone again. Continue low dose carvedilol 3.125 mg twice a day. He is not on an ace inhibitor due to chronic renal failure. Continue hydralazine/IMDUR for afterload reduction. Check CMET, CBC, and  pro BNP.     CLEGG,AMY NP-C 3:11 PM  Patient seen and examined with Tonye Becket, NP. We discussed all aspects of the encounter. I agree with the assessment and plan as stated above. He is quite tenuous. He is significantly volume overloaded and not responding to increased doses of po lasix. Will admit for IV diuresis. If not responding to IV lasix will need RHC to evaluate low output and possible inotropic support. With NSVT will continue low dose b-blocker. No ACE-I due to renal failure.   Advanced Heart Failure Team Pager [phone] (7a - 4p)  Please contact [facility] for night-coverage after hours (4p -7a ) on [email or web address] Daniel Bensimhon,MD 5:52 PM

## 2013-02-20 NOTE — Progress Notes (Signed)
History of Present Illness:  Douglas [name] is a 37 y.o. male with a h/o HTN, HL, obesity and CHF due to NICM. Management has been complicated by severe non-compliance.   Underwent cath in 2006 which showed normal cors with EF 20-25%.   EF had recovered in late 2013 but has again fallen to 15% per echo 01/20/13.  August 2012 CPX text VO2 20  He was recently admitted for heart failure exacerbation with low output symptoms.  Repeat echo showed decline in EF from 55% to 15%.  He states that he only felt well for approximately 1 day after discharge and then he began to swell again and has c/o progressive dyspnea.  He +orthopnea and PND.  He hasn't been sleeping because he can't sleep sitting up.  He says he can't eat due to feeling so full.  Urine output has declined.  Weight is up 12 pounds in the last 2 weeks.  .      Past Medical History  Diagnosis Date  . Hypertension   . Hyperlipidemia   . Systolic CHF, chronic   . Obesity   . Nonischemic cardiomyopathy     a.  echo 4/06: EF 30%, mild to mod MR, mild RAE, inf HK, lat HK , ant HK;    b.  cath 4/06: no CAD, EF 20-25%  . ED (erectile dysfunction)   . NSVT (nonsustained ventricular tachycardia)     eval by EP in past; no ICD candidate due to NYHA 1 symptoms    Current Outpatient Prescriptions  Medication Sig Dispense Refill  . aspirin EC 81 MG EC tablet Take 1 tablet (81 mg total) by mouth daily.      . carvedilol (COREG) 3.125 MG tablet Take 1 tablet (3.125 mg total) by mouth 2 (two) times daily with a meal.  60 tablet  3  . furosemide (LASIX) 80 MG tablet Take 1 tablet (80 mg total) by mouth 2 (two) times daily.  60 tablet  1  . hydrALAZINE (APRESOLINE) 25 MG tablet Take 1.5 tablets (37.5 mg total) by mouth every 8 (eight) hours.  135 tablet  3  . HYDROcodone-acetaminophen (NORCO/VICODIN) 5-325 MG per tablet Take 1-2 tablets by mouth every 6 (six) hours as needed.  20 tablet  0  . isosorbide mononitrate (IMDUR) 30 MG 24 hr tablet Take  1 tablet (30 mg total) by mouth daily.  30 tablet  3  . lisinopril (PRINIVIL,ZESTRIL) 5 MG tablet Take 1 tablet (5 mg total) by mouth daily.  30 tablet  3  . potassium chloride SA (K-DUR,KLOR-CON) 20 MEQ tablet Take 1 tablet (20 mEq total) by mouth daily.  30 tablet  3   No current facility-administered medications for this encounter.    Allergies  Allergen Reactions  . Penicillins Other (See Comments)    Unknown childhood reaction     ROS:  Please see the history of present illness.  He denies fevers, chills, melena, hematochezia.  All other systems reviewed and negative.  Vital Signs: Filed Vitals:   02/20/13 1218  BP: 102/68  Pulse: 110  Weight: 280 lb 8 oz (127.234 kg)  SpO2: 99%    PHYSICAL EXAM: Well nourished, well developed, in no acute distress HEENT: normal Neck: JVP 11-12. Carotids 2+ bilaterally.No Bruits  Cardiac:  PMI laterally displaced. Tachycardic with regular rhythm, +S3 Lungs:  clear to auscultation bilaterally, no wheezing, rhonchi or rales Ab: obese. Soft NT.  + distention.  No bruits.. Ext warm. no cyanosis or clubbing,  2+ bilateral lower extremity edema Skin: warm and dry Psych:normal affect. CNII-XII grossly intact  ASSESSMENT AND PLAN:

## 2013-02-21 ENCOUNTER — Inpatient Hospital Stay (HOSPITAL_COMMUNITY): Payer: Medicaid Other

## 2013-02-21 DIAGNOSIS — I472 Ventricular tachycardia: Secondary | ICD-10-CM

## 2013-02-21 LAB — BASIC METABOLIC PANEL
BUN: 18 mg/dL (ref 6–23)
GFR calc Af Amer: 84 mL/min — ABNORMAL LOW (ref >90)
GFR calc non Af Amer: 72 mL/min — ABNORMAL LOW (ref >90)
Potassium: 3.6 mEq/L (ref 3.5–5.1)
Sodium: 137 mEq/L (ref 135–145)

## 2013-02-21 MED ORDER — SODIUM CHLORIDE 0.9 % IV SOLN
INTRAVENOUS | Status: DC
  Administered 2013-02-22: 20 mL/h via INTRAVENOUS

## 2013-02-21 MED ORDER — SODIUM CHLORIDE 0.9 % IJ SOLN
3.0000 mL | Freq: Two times a day (BID) | INTRAMUSCULAR | Status: DC
  Administered 2013-02-21 – 2013-02-22 (×2): 3 mL via INTRAVENOUS

## 2013-02-21 MED ORDER — METOLAZONE 2.5 MG PO TABS
2.5000 mg | ORAL_TABLET | Freq: Two times a day (BID) | ORAL | Status: DC
  Administered 2013-02-21 – 2013-02-23 (×5): 2.5 mg via ORAL
  Filled 2013-02-21 (×7): qty 1

## 2013-02-21 MED ORDER — SODIUM CHLORIDE 0.9 % IJ SOLN: 3.0000 mL | INTRAMUSCULAR | Status: DC | PRN

## 2013-02-21 MED ORDER — SODIUM CHLORIDE 0.9 % IV SOLN: 250.0000 mL | INTRAVENOUS | Status: DC | PRN

## 2013-02-21 MED ORDER — GUAIFENESIN-DM 100-10 MG/5ML PO SYRP
5.0000 mL | ORAL_SOLUTION | ORAL | Status: DC | PRN
  Administered 2013-02-21 – 2013-02-22 (×2): 5 mL via ORAL
  Filled 2013-02-21 (×2): qty 5

## 2013-02-21 MED ORDER — METOLAZONE 2.5 MG PO TABS
2.5000 mg | ORAL_TABLET | Freq: Two times a day (BID) | ORAL | Status: DC
  Filled 2013-02-21 (×2): qty 1

## 2013-02-21 NOTE — Progress Notes (Signed)
Advanced Heart Failure Rounding Note   Subjective:   Douglas [name] is a 37 y.o. male with a h/o HTN, HL, obesity and CHF due to NICM. Management has been complicated by severe non-compliance.  Underwent cath in 2006 which showed normal cors with EF 20-25%. EF had recovered in late 2013 but has again fallen to 15% per echo 01/20/13.  August 2012 CPX text VO2 20   He was recently admitted for heart failure exacerbation with low output symptoms. Repeat echo showed decline in EF from 55% to 15%. Discharge weight was 268 pounds. He states that he only felt well for approximately 1 day after discharge and then he began to swell again and has c/o progressive dyspnea. He +orthopnea and PND. He hasn't been sleeping because he can't sleep sitting up. He says he can't eat due to feeling so full. Urine output has declined. Weight is up 12 pounds in the last 2 weeks. .    Yesterday he was admitted from HF clinic with volume overload and NYHA class IV symptoms. Admit Pro BNP 2002 Creatinine 1.26. He was started on IV lasix.  Weight down 2 pounds.   Continues to complain of dyspnea at rest and with exertion. + orthopnea and PND.. Denies CP      Objective:   Weight Range:  Vital Signs:   Temp:  [98 F (36.7 C)-98.3 F (36.8 C)] 98.3 F (36.8 C) (04/09 0539) Pulse Rate:  [90-110] 93 (04/09 0539) Resp:  [18-20] 18 (04/09 0539) BP: (100-121)/(67-89) 113/74 mmHg (04/09 0539) SpO2:  [99 %-100 %] 100 % (04/09 0539) Weight:  [271 lb 9.6 oz (123.197 kg)-280 lb 8 oz (127.234 kg)] 271 lb 9.6 oz (123.197 kg) (04/09 0539) Last BM Date: 02/19/13  Weight change: Filed Weights   02/20/13 1400 02/21/13 0539  Weight: 273 lb 14.4 oz (124.24 kg) 271 lb 9.6 oz (123.197 kg)    Intake/Output:   Intake/Output Summary (Last 24 hours) at 02/21/13 0750 Last data filed at 02/21/13 0601  Gross per 24 hour  Intake    822 ml  Output   1650 ml  Net   -828 ml     Physical Exam: General:  Well appearing. No resp  difficulty HEENT: normal Neck: supple. JVP to jaw . Carotids 2+ bilat; no bruits. No lymphadenopathy or thryomegaly appreciated. Cor: PMI nondisplaced. Regular rate & rhythm. No rubs, or murmurs. +S3 Lungs: clear Abdomen: soft, nontender, nondistended. No hepatosplenomegaly. No bruits or masses. Good bowel sounds. Extremities: no cyanosis, clubbing, rash, edema SCDs on bilateral lower extremities  Neuro: alert & orientedx3, cranial nerves grossly intact. moves all 4 extremities w/o difficulty. Affect pleasant  Telemetry: SR   Labs: Basic Metabolic Panel:  Recent Labs Lab 02/20/13 1450 02/21/13 0602  NA 137 137  K 4.1 3.6  CL 102 102  CO2 23 24  GLUCOSE 122* 103*  BUN 15 18  CREATININE 1.26 1.24  CALCIUM 9.1 9.0    Liver Function Tests:  Recent Labs Lab 02/20/13 1450  AST 25  ALT 29  ALKPHOS 73  BILITOT 1.5*  PROT 6.9  ALBUMIN 3.4*   No results found for this basename: LIPASE, AMYLASE,  in the last 168 hours No results found for this basename: AMMONIA,  in the last 168 hours  CBC:  Recent Labs Lab 02/20/13 1450  WBC 7.5  HGB 13.1  HCT 38.3*  MCV 81.5  PLT 247    Cardiac Enzymes: No results found for this basename: CKTOTAL, CKMB, CKMBINDEX,   TROPONINI,  in the last 168 hours  BNP: BNP (last 3 results)  Recent Labs  01/22/13 0512 01/24/13 1102 02/20/13 1450  PROBNP 1853.0* 3126.0* 2002.0*     Other results:  EKG:   Imaging:  No results found.   Medications:     Scheduled Medications: . aspirin EC  81 mg Oral Daily  . carvedilol  3.125 mg Oral BID WC  . furosemide  80 mg Intravenous BID  . heparin  5,000 Units Subcutaneous Q8H  . hydrALAZINE  25 mg Oral Q8H  . isosorbide mononitrate  30 mg Oral Daily  . lisinopril  5 mg Oral Daily  . potassium chloride  20 mEq Oral BID  . sodium chloride  3 mL Intravenous Q12H     Infusions:     PRN Medications:  sodium chloride, acetaminophen, guaiFENesin-dextromethorphan, ondansetron  (ZOFRAN) IV, sodium chloride   Assessment:   1. A/C systolic heart failure - ECHO 01/2013 EF 15%  2. NSVT  3. Chronic Renal Failure - creatinine baseline 1.3-1.4  4. 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 Noncompliance      Plan/Discussion:    Remains SOB at rest. Volume status remains elevated. Weight down 2 pounds.  Continue IV lasix 80 mg bid and Metolazone 2.5 mg bid. If UOP < 3 liters today will need RHC tomorrow. Make NPO tonight in the event he need RHC tomorrow.  Continue current dose of hydralazine and IMDUR. Continue low dose carvedilol .   Length of Stay: 1   CLEGG,AMY 02/21/2013, 7:50 AM  Advanced Heart Failure Team Pager [phone] (7a - 4p)  Please contact [facility] for night-coverage after hours (4p -7a ) on [email or web address]  Patient seen and examined with Amy Clegg, NP. We discussed all aspects of the encounter. I agree with the assessment and plan as stated above. Remains orthopneic. Mild response to IV lasix but I was hoping for better. Will add metolazone 2.5 bid. If not responding, will plan RHC tomorrow am to assess for low output. Will continue low-dose carvedilol for NSVT.  Daniel Bensimhon,MD 8:09 AM   

## 2013-02-22 ENCOUNTER — Encounter (HOSPITAL_COMMUNITY)
Admission: AD | Disposition: A | Discharge status: Home / Self Care | Payer: Self-pay | Source: Ambulatory Visit | Attending: Internal Medicine

## 2013-02-22 DIAGNOSIS — I5023 Acute on chronic systolic (congestive) heart failure: Principal | ICD-10-CM

## 2013-02-22 DIAGNOSIS — I509 Heart failure, unspecified: Secondary | ICD-10-CM

## 2013-02-22 HISTORY — PX: RIGHT HEART CATHETERIZATION: SHX5447

## 2013-02-22 LAB — POCT I-STAT 3, VENOUS BLOOD GAS (G3P V)
Bicarbonate: 24.2 mEq/L — ABNORMAL HIGH (ref 20.0–24.0)
O2 Saturation: 54 %
TCO2: 25 mmol/L (ref 0–100)
pCO2, Ven: 38.7 mmHg — ABNORMAL LOW (ref 45.0–50.0)

## 2013-02-22 LAB — BASIC METABOLIC PANEL
Creatinine, Ser: 1.32 mg/dL (ref 0.50–1.35)
Sodium: 137 mEq/L (ref 135–145)

## 2013-02-22 LAB — CBC
MCH: 28.7 pg (ref 26.0–34.0)
MCHC: 35.5 g/dL (ref 30.0–36.0)
MCV: 80.9 fL (ref 78.0–100.0)
Platelets: 242 10*3/uL (ref 150–400)
WBC: 5.7 10*3/uL (ref 4.0–10.5)

## 2013-02-22 LAB — CREATININE, SERUM: Creatinine, Ser: 1.35 mg/dL (ref 0.50–1.35)

## 2013-02-22 SURGERY — RIGHT HEART CATH
Anesthesia: LOCAL

## 2013-02-22 MED ORDER — ACETAMINOPHEN 325 MG PO TABS
650.0000 mg | ORAL_TABLET | ORAL | Status: DC | PRN
  Administered 2013-02-24: 650 mg via ORAL
  Filled 2013-02-22: qty 2

## 2013-02-22 MED ORDER — LIDOCAINE HCL (PF) 1 % IJ SOLN
INTRAMUSCULAR | Status: AC
  Filled 2013-02-22: qty 30

## 2013-02-22 MED ORDER — SODIUM CHLORIDE 0.9 % IJ SOLN
3.0000 mL | Freq: Two times a day (BID) | INTRAMUSCULAR | Status: DC
  Administered 2013-02-22 – 2013-02-23 (×2): 3 mL via INTRAVENOUS

## 2013-02-22 MED ORDER — POTASSIUM CHLORIDE CRYS ER 20 MEQ PO TBCR
40.0000 meq | EXTENDED_RELEASE_TABLET | Freq: Once | ORAL | Status: AC
  Administered 2013-02-22: 40 meq via ORAL

## 2013-02-22 MED ORDER — ENOXAPARIN SODIUM 40 MG/0.4ML ~~LOC~~ SOLN
40.0000 mg | SUBCUTANEOUS | Status: DC
  Administered 2013-02-23 – 2013-02-26 (×4): 40 mg via SUBCUTANEOUS
  Filled 2013-02-22 (×5): qty 0.4

## 2013-02-22 MED ORDER — SODIUM CHLORIDE 0.9 % IJ SOLN: 3.0000 mL | INTRAMUSCULAR | Status: DC | PRN

## 2013-02-22 MED ORDER — HEPARIN (PORCINE) IN NACL 2-0.9 UNIT/ML-% IJ SOLN
INTRAMUSCULAR | Status: AC
  Filled 2013-02-22: qty 500

## 2013-02-22 MED ORDER — ONDANSETRON HCL 4 MG/2ML IJ SOLN: 4.0000 mg | Freq: Four times a day (QID) | INTRAMUSCULAR | Status: DC | PRN

## 2013-02-22 MED ORDER — SODIUM CHLORIDE 0.9 % IV SOLN: 250.0000 mL | INTRAVENOUS | Status: DC | PRN

## 2013-02-22 MED ORDER — FENTANYL CITRATE 0.05 MG/ML IJ SOLN
INTRAMUSCULAR | Status: AC
  Filled 2013-02-22: qty 2

## 2013-02-22 MED ORDER — MIDAZOLAM HCL 2 MG/2ML IJ SOLN
INTRAMUSCULAR | Status: AC
  Filled 2013-02-22: qty 2

## 2013-02-22 NOTE — CV Procedure (Signed)
Cardiac Cath Procedure Note:  Indication:   Procedures performed:  1) Right heart catheterization  Description of procedure:   The risks and indication of the procedure were explained. Consent was signed and placed on the chart. An appropriate timeout was taken prior to the procedure. The right groin was prepped and draped in the routine sterile fashion and anesthetized with 1% local lidocaine.   A 7 FR venous sheath was placed in the right femoral vein using a modified Seldinger technique. A standard [facility] catheter was used for the procedure.   Complications: None apparent.  Findings:  RA = 12 RV = 49/6/15  PA = 54/29 (41) PCW = 28 (v= 40) Fick cardiac output/index = 4.3/1.7 PVR = 2.6 Woods SVR = 1580 dynes FA sat = 98% PA sat = 53%, 54% Non-invasive BP = 124/86 (97)  Assessment: 1. Significant biventricular HF with elevated filling pressures and low cardiac output  Plan/Discussion:  Continue diuresis. Low threshold to add inotropic support.   Douglas [name] 11:47 AM

## 2013-02-22 NOTE — Interval H&P Note (Signed)
History and Physical Interval Note:  02/22/2013 11:24 AM  Douglas [name]  has presented today for surgery, with the diagnosis of Heart failure  The various methods of treatment have been discussed with the patient and family. After consideration of risks, benefits and other options for treatment, the patient has consented to  Procedure(s): RIGHT HEART CATH (N/A) as a surgical intervention .  The patient's history has been reviewed, patient examined, no change in status, stable for surgery.  I have reviewed the patient's chart and labs.  Questions were answered to the patient's satisfaction.     Daniel Bensimhon

## 2013-02-22 NOTE — Progress Notes (Signed)
Utilization Review Completed.   Emmerson Taddei, RN, BSN Nurse Case Manager  [phone]  

## 2013-02-22 NOTE — H&P (View-Only) (Signed)
Advanced Heart Failure Rounding Note   Subjective:   Douglas [name] is a 37 y.o. male with a h/o HTN, HL, obesity and CHF due to NICM. Management has been complicated by severe non-compliance.  Underwent cath in 2006 which showed normal cors with EF 20-25%. EF had recovered in late 2013 but has again fallen to 15% per echo 01/20/13.  August 2012 CPX text VO2 20   He was recently admitted for heart failure exacerbation with low output symptoms. Repeat echo showed decline in EF from 55% to 15%. Discharge weight was 268 pounds. He states that he only felt well for approximately 1 day after discharge and then he began to swell again and has c/o progressive dyspnea. He +orthopnea and PND. He hasn't been sleeping because he can't sleep sitting up. He says he can't eat due to feeling so full. Urine output has declined. Weight is up 12 pounds in the last 2 weeks. Burgess Estelle he was admitted from HF clinic with volume overload and NYHA class IV symptoms. Admit Pro BNP 2002 Creatinine 1.26. He was started on IV lasix.  Weight down 2 pounds.   Continues to complain of dyspnea at rest and with exertion. + orthopnea and PND.Marland Kitchen Denies CP      Objective:   Weight Range:  Vital Signs:   Temp:  [98 F (36.7 C)-98.3 F (36.8 C)] 98.3 F (36.8 C) (04/09 0539) Pulse Rate:  [90-110] 93 (04/09 0539) Resp:  [18-20] 18 (04/09 0539) BP: (100-121)/(67-89) 113/74 mmHg (04/09 0539) SpO2:  [99 %-100 %] 100 % (04/09 0539) Weight:  [271 lb 9.6 oz (123.197 kg)-280 lb 8 oz (127.234 kg)] 271 lb 9.6 oz (123.197 kg) (04/09 0539) Last BM Date: 02/19/13  Weight change: Filed Weights   02/20/13 1400 02/21/13 0539  Weight: 273 lb 14.4 oz (124.24 kg) 271 lb 9.6 oz (123.197 kg)    Intake/Output:   Intake/Output Summary (Last 24 hours) at 02/21/13 0750 Last data filed at 02/21/13 0601  Gross per 24 hour  Intake    822 ml  Output   1650 ml  Net   -828 ml     Physical Exam: General:  Well appearing. No resp  difficulty HEENT: normal Neck: supple. JVP to jaw . Carotids 2+ bilat; no bruits. No lymphadenopathy or thryomegaly appreciated. Cor: PMI nondisplaced. Regular rate & rhythm. No rubs, or murmurs. +S3 Lungs: clear Abdomen: soft, nontender, nondistended. No hepatosplenomegaly. No bruits or masses. Good bowel sounds. Extremities: no cyanosis, clubbing, rash, edema SCDs on bilateral lower extremities  Neuro: alert & orientedx3, cranial nerves grossly intact. moves all 4 extremities w/o difficulty. Affect pleasant  Telemetry: SR   Labs: Basic Metabolic Panel:  Recent Labs Lab 02/20/13 1450 02/21/13 0602  NA 137 137  K 4.1 3.6  CL 102 102  CO2 23 24  GLUCOSE 122* 103*  BUN 15 18  CREATININE 1.26 1.24  CALCIUM 9.1 9.0    Liver Function Tests:  Recent Labs Lab 02/20/13 1450  AST 25  ALT 29  ALKPHOS 73  BILITOT 1.5*  PROT 6.9  ALBUMIN 3.4*   No results found for this basename: LIPASE, AMYLASE,  in the last 168 hours No results found for this basename: AMMONIA,  in the last 168 hours  CBC:  Recent Labs Lab 02/20/13 1450  WBC 7.5  HGB 13.1  HCT 38.3*  MCV 81.5  PLT 247    Cardiac Enzymes: No results found for this basename: CKTOTAL, CKMB, CKMBINDEX,  TROPONINI,  in the last 168 hours  BNP: BNP (last 3 results)  Recent Labs  01/22/13 0512 01/24/13 1102 02/20/13 1450  PROBNP 1853.0* 3126.0* 2002.0*     Other results:  EKG:   Imaging:  No results found.   Medications:     Scheduled Medications: . aspirin EC  81 mg Oral Daily  . carvedilol  3.125 mg Oral BID WC  . furosemide  80 mg Intravenous BID  . heparin  5,000 Units Subcutaneous Q8H  . hydrALAZINE  25 mg Oral Q8H  . isosorbide mononitrate  30 mg Oral Daily  . lisinopril  5 mg Oral Daily  . potassium chloride  20 mEq Oral BID  . sodium chloride  3 mL Intravenous Q12H     Infusions:     PRN Medications:  sodium chloride, acetaminophen, guaiFENesin-dextromethorphan, ondansetron  (ZOFRAN) IV, sodium chloride   Assessment:   1. A/C systolic heart failure - ECHO 11/6107 EF 15%  2. NSVT  3. Chronic Renal Failure - creatinine baseline 1.3-1.4  4. 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 Noncompliance      Plan/Discussion:    Remains SOB at rest. Volume status remains elevated. Weight down 2 pounds.  Continue IV lasix 80 mg bid and Metolazone 2.5 mg bid. If UOP < 3 liters today will need RHC tomorrow. Make NPO tonight in the event he need RHC tomorrow.  Continue current dose of hydralazine and IMDUR. Continue low dose carvedilol .   Length of Stay: 1   CLEGG,AMY 02/21/2013, 7:50 AM  Advanced Heart Failure Team Pager [phone] (7a - 4p)  Please contact [facility] for night-coverage after hours (4p -7a ) on [email or web address]  Patient seen and examined with Tonye Becket, NP. We discussed all aspects of the encounter. I agree with the assessment and plan as stated above. Remains orthopneic. Mild response to IV lasix but I was hoping for better. Will add metolazone 2.5 bid. If not responding, will plan RHC tomorrow am to assess for low output. Will continue low-dose carvedilol for NSVT.  Dontea Corlew,MD 8:09 AM

## 2013-02-23 LAB — MAGNESIUM: Magnesium: 2 mg/dL (ref 1.5–2.5)

## 2013-02-23 LAB — POCT I-STAT 3, VENOUS BLOOD GAS (G3P V): pO2, Ven: 28 mmHg — CL (ref 30.0–45.0)

## 2013-02-23 LAB — BASIC METABOLIC PANEL
BUN: 25 mg/dL — ABNORMAL HIGH (ref 6–23)
Calcium: 9.5 mg/dL (ref 8.4–10.5)
Creatinine, Ser: 1.48 mg/dL — ABNORMAL HIGH (ref 0.50–1.35)
GFR calc non Af Amer: 58 mL/min — ABNORMAL LOW (ref >90)
Glucose, Bld: 108 mg/dL — ABNORMAL HIGH (ref 70–99)

## 2013-02-23 LAB — POCT I-STAT 3, ART BLOOD GAS (G3+)
TCO2: 22 mmol/L (ref 0–100)
pH, Arterial: 7.331 — ABNORMAL LOW (ref 7.350–7.450)

## 2013-02-23 MED ORDER — POTASSIUM CHLORIDE CRYS ER 20 MEQ PO TBCR
40.0000 meq | EXTENDED_RELEASE_TABLET | Freq: Once | ORAL | Status: AC
  Administered 2013-02-23: 40 meq via ORAL
  Filled 2013-02-23: qty 2

## 2013-02-23 MED ORDER — HYDRALAZINE HCL 25 MG PO TABS: 25.0000 mg | ORAL_TABLET | Freq: Three times a day (TID) | ORAL | Status: DC

## 2013-02-23 MED ORDER — FUROSEMIDE 80 MG PO TABS
80.0000 mg | ORAL_TABLET | Freq: Two times a day (BID) | ORAL | Status: DC
  Administered 2013-02-23 – 2013-02-24 (×2): 80 mg via ORAL
  Filled 2013-02-23 (×4): qty 1

## 2013-02-23 NOTE — Discharge Summary (Signed)
Advanced Heart Failure Team  Discharge Summary   Patient ID: Douglas [name] MRN: [id number], DOB/AGE: January 22, 1976 37 y.o. Admit date: 02/20/2013 D/C date:     02/26/2013    Primary Discharge Diagnoses:  1. A/C systolic heart failure - ECHO 05/8294 EF 15%  2. NSVT  3. Chronic Renal Failure - creatinine baseline 1.3-1.4  4. 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 Noncompliance  6. Hyponatremia 7. Hypokalemia   Hospital Course:  HARDY HARCUM is a 37 y.o. male with a h/o HTN, HL, obesity and CHF due to NICM. Management has been complicated by severe non-compliance. August 2012 CPX text VO2 20 . Underwent cath in 2006 which showed normal cors with EF 20-25%. EF had recovered in late 2013 but has again fallen to 15% per echo 01/20/13.  He was  Admitted from HF clinic with volume overload and NYHA class IV symptoms. Admit Pro BNP 2002 Creatinine 1.26. He was started on IV lasix but due to sluggish diuresis he was taken to cath lab to further evaluate hemodynamics. As noted in RHC below he had elevated filling pressure.  Diuresed with IV lasix/metolazone and transitioned to lasix 80 mg po bid. Overall his weight is down 19 pounds. He will continue on low dose carvedilol, lisinopril, and hydralazine/IMDUR.   He had ongoing runs of VT with the longest run 55 beats. He started on Amiodarone drip and transitioned to po amiodarone 200 mg daily. Evaluated by EP with recommendations for lifevest once discharged and continue to optimize HF medications in HF Clinic. He continued to improve and he was able to ambulate without dyspnea. Discharge Weight 254 pounds.   He will continue to be followed closely in the HF clinic for ongoing medication optimization. He is scheduled to follow up April 22 at 1:30.  If he follows up will consider lifevest at that time.    RHC 02/22/13  RA = 12  RV = 49/6/15  PA = 54/29 (41)  PCW = 28 (v= 40)  Fick cardiac output/index = 4.3/1.7  PVR = 2.6 Woods  SVR = 1580  dynes  FA sat = 98%  PA sat = 53%, 54%  Non-invasive BP = 124/86 (97)  Discharge Weight Range: Discharge Weight 254  Discharge Vitals: Blood pressure 104/68, pulse 81, temperature 97.5 F (36.4 C), temperature source Oral, resp. rate 17, height 6\' 4"  (1.93 m), weight 254 lb (115.214 kg), SpO2 95.00%.  Labs: Lab Results  Component Value Date   WBC 5.7 02/22/2013   HGB 14.0 02/22/2013   HCT 39.4 02/22/2013   MCV 80.9 02/22/2013   PLT 242 02/22/2013    Recent Labs Lab 02/20/13 1450  02/26/13 0515  NA 137  < > 127*  K 4.1  < > 4.4  CL 102  < > 91*  CO2 23  < > 24  BUN 15  < > 34*  CREATININE 1.26  < > 1.30  CALCIUM 9.1  < > 9.2  PROT 6.9  --   --   BILITOT 1.5*  --   --   ALKPHOS 73  --   --   ALT 29  --   --   AST 25  --   --   GLUCOSE 122*  < > 122*  < > = values in this interval not displayed. Lab Results  Component Value Date   CHOL 169 06/23/2012   HDL 47 06/23/2012   LDLCALC 621* 06/23/2012   TRIG 97 06/23/2012  BNP (last 3 results)  Recent Labs  01/22/13 0512 01/24/13 1102 02/20/13 1450  PROBNP 1853.0* 3126.0* 2002.0*    Diagnostic Studies/Procedures   No results found.  Discharge Medications     Medication List    TAKE these medications       amiodarone 200 MG tablet  Commonly known as:  PACERONE  Take 1 tablet (200 mg total) by mouth daily.     aspirin EC 81 MG tablet  Take 81 mg by mouth daily.     carvedilol 6.25 MG tablet  Commonly known as:  COREG  Take 1 tablet (6.25 mg total) by mouth 2 (two) times daily with a meal.     furosemide 80 MG tablet  Commonly known as:  LASIX  Take 80 mg by mouth 2 (two) times daily.     hydrALAZINE 25 MG tablet  Commonly known as:  APRESOLINE  Take 1 tablet (25 mg total) by mouth 3 (three) times daily.     HYDROcodone-acetaminophen 5-325 MG per tablet  Commonly known as:  NORCO/VICODIN  Take 1-2 tablets by mouth every 6 (six) hours as needed.     isosorbide mononitrate 30 MG 24 hr tablet  Commonly  known as:  IMDUR  Take 30 mg by mouth daily.     lisinopril 5 MG tablet  Commonly known as:  PRINIVIL,ZESTRIL  Take 5 mg by mouth daily.     potassium chloride SA 20 MEQ tablet  Commonly known as:  K-DUR,KLOR-CON  Take 20 mEq by mouth daily.        Disposition   The patient will be discharged in stable condition to home. Discharge Orders   Future Appointments Provider Department Dept Phone   03/06/2013 3:15 PM [facility] [phone]   Future Orders Complete By Expires     (HEART FAILURE PATIENTS) Call MD:  Anytime you have any of the following symptoms: 1) 3 pound weight gain in 24 hours or 5 pounds in 1 week 2) shortness of breath, with or without a dry hacking cough 3) swelling in the hands, feet or stomach 4) if you have to sleep on extra pillows at night in order to breathe.  As directed     ACE Inhibitor / ARB already ordered  As directed     Diet - low sodium heart healthy  As directed     Increase activity slowly  As directed       Follow-up Information   Follow up with [name], MD On 03/06/2013. (at 3:15 Garage Code [id number])    Contact information:   [address] [place] [address] [phone]         Duration of Discharge Encounter: Greater than 35 minutes   Signed, CLEGG,AMY  02/26/2013, 3:00 PM  Patient seen and examined with Tonye Becket, NP. We discussed all aspects of the encounter. I agree with the assessment and plan as stated above.  Overall stable. However he has multiple high-risk features with his HF (Hyponatremia, low bp, recurrent VT). Will plan d/c home today with LifeVest. Will need close f/u in HF clinic. Stressed need for compliance with medication regimen. Will increase carvedilol to 6.25 bid.   Baylon Santelli,MD 3:04 PM

## 2013-02-23 NOTE — Progress Notes (Signed)
Advanced Heart Failure Rounding Note   Subjective:   Douglas [name] is a 37 y.o. male with a h/o HTN, HL, obesity and CHF due to NICM. Management has been complicated by severe non-compliance.  Underwent cath in 2006 which showed normal cors with EF 20-25%. EF had recovered in late 2013 but has again fallen to 15% per echo 01/20/13.  August 2012 CPX text VO2 20   He was recently admitted for heart failure exacerbation with low output symptoms. Repeat echo showed decline in EF from 55% to 15%. Discharge weight was 268 pounds. He states that he only felt well for approximately 1 day after discharge and then he began to swell again and has c/o progressive dyspnea. He +orthopnea and PND. He hasn't been sleeping because he can't sleep sitting up. He says he can't eat due to feeling so full. Urine output has declined. Weight is up 12 pounds in the last 2 weeks. .    Admitted from HF clinic with volume overload and NYHA class IV symptoms. Admit Pro BNP 2002 Creatinine 1.26. He was started on IV lasix.  Due to sluggish diuresis he was taken to cath lab to further evaluate hemodynamics and received IV lasix and Metolazone. Weight down 9 pounds over night. Overall his weight is down 16 pounds.   Denies SOB/PND/Orthopnea.   RHC 02/22/13 RA = 12  RV = 49/6/15  PA = 54/29 (41)  PCW = 28 (v= 40)  Fick cardiac output/index = 4.3/1.7  PVR = 2.6 Woods  SVR = 1580 dynes  FA sat = 98%  PA sat = 53%, 54%  Non-invasive BP = 124/86 (97)    Objective:   Weight Range:  Vital Signs:   Temp:  [98.1 F (36.7 C)-98.3 F (36.8 C)] 98.1 F (36.7 C) (04/11 0533) Pulse Rate:  [87-99] 89 (04/11 0533) Resp:  [16-19] 19 (04/11 0533) BP: (106-114)/(48-77) 109/70 mmHg (04/11 0533) SpO2:  [97 %-100 %] 98 % (04/11 0533) Weight:  [257 lb 4.4 oz (116.7 kg)] 257 lb 4.4 oz (116.7 kg) (04/11 0533) Last BM Date: 02/22/13  Weight change: Filed Weights   02/21/13 0539 02/22/13 0545 02/23/13 0533  Weight: 271 lb 9.6  oz (123.197 kg) 266 lb 3.2 oz (120.748 kg) 257 lb 4.4 oz (116.7 kg)    Intake/Output:   Intake/Output Summary (Last 24 hours) at 02/23/13 0832 Last data filed at 02/23/13 0800  Gross per 24 hour  Intake   1493 ml  Output   4400 ml  Net  -2907 ml     Physical Exam: General:  Well appearing. No resp difficulty HEENT: normal Neck: supple. JVP 5 . Carotids 2+ bilat; no bruits. No lymphadenopathy or thryomegaly appreciated. Cor: PMI nondisplaced. Regular rate & rhythm. No rubs, or murmurs. +S3 Lungs: clear Abdomen: soft, nontender, nondistended. No hepatosplenomegaly. No bruits or masses. Good bowel sounds. Extremities: no cyanosis, clubbing, rash, edema SCDs on bilateral lower extremities  Neuro: alert & orientedx3, cranial nerves grossly intact. moves all 4 extremities w/o difficulty. Affect pleasant   Telemetry: SR NSVT  Labs: Basic Metabolic Panel:  Recent Labs Lab 02/20/13 1450 02/21/13 0602 02/22/13 0610 02/22/13 1415 02/23/13 0540  NA 137 137 137  --  132*  K 4.1 3.6 3.3*  --  3.5  CL 102 102 100  --  95*  CO2 23 24 26   --  28  GLUCOSE 122* 103* 94  --  108*  BUN 15 18 19   --  25*  CREATININE  1.26 1.24 1.32 1.35 1.48*  CALCIUM 9.1 9.0 9.2  --  9.5    Liver Function Tests:  Recent Labs Lab 02/20/13 1450  AST 25  ALT 29  ALKPHOS 73  BILITOT 1.5*  PROT 6.9  ALBUMIN 3.4*   No results found for this basename: LIPASE, AMYLASE,  in the last 168 hours No results found for this basename: AMMONIA,  in the last 168 hours  CBC:  Recent Labs Lab 02/20/13 1450 02/22/13 1415  WBC 7.5 5.7  HGB 13.1 14.0  HCT 38.3* 39.4  MCV 81.5 80.9  PLT 247 242    Cardiac Enzymes: No results found for this basename: CKTOTAL, CKMB, CKMBINDEX, TROPONINI,  in the last 168 hours  BNP: BNP (last 3 results)  Recent Labs  01/22/13 0512 01/24/13 1102 02/20/13 1450  PROBNP 1853.0* 3126.0* 2002.0*     Other results:  EKG:   Imaging: No results  found.   Medications:     Scheduled Medications: . aspirin EC  81 mg Oral Daily  . carvedilol  3.125 mg Oral BID WC  . enoxaparin (LOVENOX) injection  40 mg Subcutaneous Q24H  . furosemide  80 mg Intravenous BID  . hydrALAZINE  25 mg Oral Q8H  . isosorbide mononitrate  30 mg Oral Daily  . lisinopril  5 mg Oral Daily  . metolazone  2.5 mg Oral BID  . potassium chloride  20 mEq Oral BID  . sodium chloride  3 mL Intravenous Q12H  . sodium chloride  3 mL Intravenous Q12H    Infusions:    PRN Medications: sodium chloride, sodium chloride, acetaminophen, guaiFENesin-dextromethorphan, ondansetron (ZOFRAN) IV, sodium chloride, sodium chloride   Assessment:   1. A/C systolic heart failure - ECHO 03/7845 EF 15%  2. NSVT  3. Chronic Renal Failure - creatinine baseline 1.3-1.4  4. 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 Noncompliance      Plan/Discussion:   Appears euvolemic. Creatinine bump noted. Stop IV lasix and Metolazone. Start home regimen Lasix 80 mg twice a day. Give an extra 40 meq of potassium. Continue current dose of carvedilol and hydralazine/IMDUR. Anticipate home in am.   Check Magnesium level.   Consult cardiac rehab.   Length of Stay: 3   CLEGG,AMY 02/23/2013, 8:32 AM  Advanced Heart Failure Team Pager [phone] (7a - 4p)  Please contact [facility] for night-coverage after hours (4p -7a ) on [email or web address]  Patient seen and examined with Tonye Becket, NP. We discussed all aspects of the encounter. I agree with the assessment and plan as stated above.   Volume status is much improved. He is now euvolemic. Cr up slightly. Will hold diuretics today. Will likely be able to go home in am with very close f/u in HF clinic.   Rahima Fleishman,MD 3:56 PM

## 2013-02-23 NOTE — Progress Notes (Signed)
Pt had 22 beats of vtach, pt asymptomatic MD aware

## 2013-02-23 NOTE — Plan of Care (Signed)
Problem: Phase I Progression Outcomes Goal: Dyspnea controlled at rest (HF) Outcome: Completed/Met Date Met:  02/23/13 Pt has not had any c/o SOB while at rest Goal: Pain controlled with appropriate interventions Outcome: Completed/Met Date Met:  02/23/13 Pt has not had any c/o pain Goal: EF % per last Echo/documented,Core Reminder form on chart Outcome: Completed/Met Date Met:  02/23/13 EF as of 02/17/13 30% Goal: Initial discharge plan identified Outcome: Completed/Met Date Met:  02/23/13 Initial plan of dc is to return home with family Goal: Voiding-avoid urinary catheter unless indicated Outcome: Completed/Met Date Met:  02/23/13 Pt voiding adequate amts of urine, no need for foley

## 2013-02-24 LAB — BASIC METABOLIC PANEL
CO2: 27 mEq/L (ref 19–32)
Calcium: 9.4 mg/dL (ref 8.4–10.5)
GFR calc non Af Amer: 65 mL/min — ABNORMAL LOW (ref >90)
Sodium: 129 mEq/L — ABNORMAL LOW (ref 135–145)

## 2013-02-24 LAB — MAGNESIUM: Magnesium: 2.2 mg/dL (ref 1.5–2.5)

## 2013-02-24 MED ORDER — CARVEDILOL 6.25 MG PO TABS
6.2500 mg | ORAL_TABLET | Freq: Two times a day (BID) | ORAL | Status: DC
  Administered 2013-02-24 – 2013-02-26 (×4): 6.25 mg via ORAL
  Filled 2013-02-24 (×6): qty 1

## 2013-02-24 NOTE — Progress Notes (Addendum)
Advanced Heart Failure Rounding Note   Subjective:   Douglas [name] is a 37 y.o. male with a h/o HTN, HL, obesity and CHF due to NICM. Management has been complicated by severe non-compliance.  Underwent cath in 2006 which showed normal cors with EF 20-25%. EF had recovered in late 2013 but has again fallen to 15% per echo 01/20/13.  August 2012 CPX text VO2 20   He was recently admitted for heart failure exacerbation with low output symptoms. Repeat echo showed decline in EF from 55% to 15%. Discharge weight was 268 pounds. He states that he only felt well for approximately 1 day after discharge and then he began to swell again and has c/o progressive dyspnea. He +orthopnea and PND. He hasn't been sleeping because he can't sleep sitting up. He says he can't eat due to feeling so full. Urine output has declined. Weight is up 12 pounds in the last 2 weeks. .    Admitted from HF clinic with volume overload and NYHA class IV symptoms. Admit Pro BNP 2002 Creatinine 1.26. He was started on IV lasix.  Due to sluggish diuresis he was taken to cath lab to further evaluate hemodynamics and received IV lasix and Metolazone.    RHC 02/22/13 RA = 12  RV = 49/6/15  PA = 54/29 (41)  PCW = 28 (v= 40)  Fick cardiac output/index = 4.3/1.7  PVR = 2.6 Woods  SVR = 1580 dynes  FA sat = 98%  PA sat = 53%, 54%  Non-invasive BP = 124/86 (97)  Had 30-beat run of symptomatic VT last night. Now feels better. K 4.4 mg 2.2. Weight down another 6 pounds on po lasix. Now down 22 pounds. Denies SOB/PND/Orthopnea.   Objective:   Weight Range:  Vital Signs:   Temp:  [97.3 F (36.3 C)-98.6 F (37 C)] 97.9 F (36.6 C) (04/12 0532) Pulse Rate:  [87-99] 87 (04/12 0641) Resp:  [18-19] 19 (04/12 0532) BP: (99-117)/(58-78) 113/78 mmHg (04/12 0641) SpO2:  [98 %-99 %] 99 % (04/12 0532) Weight:  [114.2 kg (251 lb 12.3 oz)] 114.2 kg (251 lb 12.3 oz) (04/12 0532) Last BM Date: 02/23/13  Weight change: Filed Weights    02/22/13 0545 02/23/13 0533 02/24/13 0532  Weight: 120.748 kg (266 lb 3.2 oz) 116.7 kg (257 lb 4.4 oz) 114.2 kg (251 lb 12.3 oz)    Intake/Output:   Intake/Output Summary (Last 24 hours) at 02/24/13 0824 Last data filed at 02/24/13 0720  Gross per 24 hour  Intake    863 ml  Output   3850 ml  Net  -2987 ml     Physical Exam: General:  Well appearing. No resp difficulty HEENT: normal Neck: supple. JVP 5 . Carotids 2+ bilat; no bruits. No lymphadenopathy or thryomegaly appreciated. Cor: PMI nondisplaced. Regular rate & rhythm. No rubs, or murmurs. +S3 Lungs: clear Abdomen: soft, nontender, nondistended. No hepatosplenomegaly. No bruits or masses. Good bowel sounds. Extremities: no cyanosis, clubbing, rash, edema SCDs on bilateral lower extremities  Neuro: alert & orientedx3, cranial nerves grossly intact. moves all 4 extremities w/o difficulty. Affect pleasant   Telemetry: SR NSVT  Labs: Basic Metabolic Panel:  Recent Labs Lab 02/20/13 1450 02/21/13 0602 02/22/13 0610 02/22/13 1415 02/23/13 0540 02/24/13 0209  NA 137 137 137  --  132*  --   K 4.1 3.6 3.3*  --  3.5 4.0  CL 102 102 100  --  95*  --   CO2 23 24 26   --  28  --   GLUCOSE 122* 103* 94  --  108*  --   BUN 15 18 19   --  25*  --   CREATININE 1.26 1.24 1.32 1.35 1.48*  --   CALCIUM 9.1 9.0 9.2  --  9.5  --   MG  --   --   --   --  2.0 2.2    Liver Function Tests:  Recent Labs Lab 02/20/13 1450  AST 25  ALT 29  ALKPHOS 73  BILITOT 1.5*  PROT 6.9  ALBUMIN 3.4*   No results found for this basename: LIPASE, AMYLASE,  in the last 168 hours No results found for this basename: AMMONIA,  in the last 168 hours  CBC:  Recent Labs Lab 02/20/13 1450 02/22/13 1415  WBC 7.5 5.7  HGB 13.1 14.0  HCT 38.3* 39.4  MCV 81.5 80.9  PLT 247 242    Cardiac Enzymes: No results found for this basename: CKTOTAL, CKMB, CKMBINDEX, TROPONINI,  in the last 168 hours  BNP: BNP (last 3 results)  Recent Labs   01/22/13 0512 01/24/13 1102 02/20/13 1450  PROBNP 1853.0* 3126.0* 2002.0*     Other results:  EKG:   Imaging: No results found.   Medications:     Scheduled Medications: . aspirin EC  81 mg Oral Daily  . carvedilol  3.125 mg Oral BID WC  . enoxaparin (LOVENOX) injection  40 mg Subcutaneous Q24H  . furosemide  80 mg Oral BID  . hydrALAZINE  25 mg Oral Q8H  . isosorbide mononitrate  30 mg Oral Daily  . lisinopril  5 mg Oral Daily  . potassium chloride  20 mEq Oral BID  . sodium chloride  3 mL Intravenous Q12H    Infusions:    PRN Medications: sodium chloride, acetaminophen, guaiFENesin-dextromethorphan, ondansetron (ZOFRAN) IV, sodium chloride   Assessment:   1. A/C systolic heart failure - ECHO 11/6107 EF 15%  2. NSVT  3. Chronic Renal Failure - creatinine baseline 1.3-1.4  4. 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 Noncompliance      Plan/Discussion:    We had planned for discharge today but given 30 beat run NSVT will need to have LifeVest back prior to d/c. I have contacted company to place.   Will hold lasix today. Check BMET. Titrate carvedilol slowly.   Tameyah Koch,MD 8:24 AM

## 2013-02-24 NOTE — Progress Notes (Signed)
Called by nurse regarding pt's BP - 87/67. Asymptomatic. Has had low normal BP while here - 99-117/58-70. Gave order to hold PM hydralazine dose today.

## 2013-02-24 NOTE — Progress Notes (Signed)
Brook PA for [employer] notified of low BP and hydralazine will be held per order.

## 2013-02-24 NOTE — Progress Notes (Signed)
The patient had a 30+ beat run of V Tach.  The patient stated that he felt "something" happen to his heart for a few seconds, but was otherwise asymptomatic.  MD notified.  New orders given to obtain a STAT magnesium and potassium level on the patient.  The RN will carry out the orders and will continue to monitor the patient.

## 2013-02-25 DIAGNOSIS — E876 Hypokalemia: Secondary | ICD-10-CM

## 2013-02-25 LAB — BASIC METABOLIC PANEL
GFR calc Af Amer: 78 mL/min — ABNORMAL LOW (ref >90)
GFR calc non Af Amer: 67 mL/min — ABNORMAL LOW (ref >90)
Glucose, Bld: 114 mg/dL — ABNORMAL HIGH (ref 70–99)
Potassium: 3.8 mEq/L (ref 3.5–5.1)
Sodium: 131 mEq/L — ABNORMAL LOW (ref 135–145)

## 2013-02-25 MED ORDER — POTASSIUM CHLORIDE CRYS ER 20 MEQ PO TBCR
20.0000 meq | EXTENDED_RELEASE_TABLET | Freq: Two times a day (BID) | ORAL | Status: DC
  Administered 2013-02-25 – 2013-02-26 (×3): 20 meq via ORAL
  Filled 2013-02-25 (×4): qty 1

## 2013-02-25 MED ORDER — AMIODARONE HCL IN DEXTROSE 360-4.14 MG/200ML-% IV SOLN
60.0000 mg/h | INTRAVENOUS | Status: AC
  Administered 2013-02-25 (×2): 60 mg/h via INTRAVENOUS
  Filled 2013-02-25 (×2): qty 200

## 2013-02-25 MED ORDER — FUROSEMIDE 10 MG/ML IJ SOLN
80.0000 mg | Freq: Two times a day (BID) | INTRAMUSCULAR | Status: DC
  Administered 2013-02-25 – 2013-02-26 (×4): 80 mg via INTRAVENOUS
  Filled 2013-02-25 (×4): qty 8

## 2013-02-25 MED ORDER — POTASSIUM CHLORIDE CRYS ER 20 MEQ PO TBCR
40.0000 meq | EXTENDED_RELEASE_TABLET | Freq: Once | ORAL | Status: AC
  Administered 2013-02-25: 40 meq via ORAL
  Filled 2013-02-25: qty 2

## 2013-02-25 MED ORDER — DIAZEPAM 5 MG/ML IJ SOLN
2.5000 mg | Freq: Four times a day (QID) | INTRAMUSCULAR | Status: DC | PRN
  Administered 2013-02-25: 18:00:00 via INTRAVENOUS
  Administered 2013-02-26: 2.5 mg via INTRAVENOUS
  Filled 2013-02-25 (×2): qty 2

## 2013-02-25 MED ORDER — AMIODARONE HCL IN DEXTROSE 360-4.14 MG/200ML-% IV SOLN
30.0000 mg/h | INTRAVENOUS | Status: DC
  Administered 2013-02-25: 30 mg/h via INTRAVENOUS
  Filled 2013-02-25 (×3): qty 200

## 2013-02-25 MED ORDER — AMIODARONE LOAD VIA INFUSION
150.0000 mg | Freq: Once | INTRAVENOUS | Status: AC
  Administered 2013-02-25: 150 mg via INTRAVENOUS
  Filled 2013-02-25: qty 83.34

## 2013-02-25 NOTE — Progress Notes (Signed)
Advanced Heart Failure Rounding Note   Subjective:   Douglas [name] is a 37 y.o. male with a h/o HTN, HL, obesity and CHF due to NICM. Management has been complicated by severe non-compliance.  Underwent cath in 2006 which showed normal cors with EF 20-25%. EF had recovered in late 2013 but has again fallen to 15% per echo 01/20/13.  August 2012 CPX text VO2 20   He was recently admitted for heart failure exacerbation with low output symptoms. Repeat echo showed decline in EF from 55% to 15%. Discharge weight was 268 pounds. He states that he only felt well for approximately 1 day after discharge and then he began to swell again and has c/o progressive dyspnea. He +orthopnea and PND. He hasn't been sleeping because he can't sleep sitting up. He says he can't eat due to feeling so full. Urine output has declined. Weight is up 12 pounds in the last 2 weeks. .    Admitted from HF clinic with volume overload and NYHA class IV symptoms. Admit Pro BNP 2002 Creatinine 1.26. He was started on IV lasix.  Due to sluggish diuresis he was taken to cath lab to further evaluate hemodynamics and received IV lasix and Metolazone.    RHC 02/22/13 RA = 12  RV = 49/6/15  PA = 54/29 (41)  PCW = 28 (v= 40)  Fick cardiac output/index = 4.3/1.7  PVR = 2.6 Woods  SVR = 1580 dynes  FA sat = 98%  PA sat = 53%, 54%  Non-invasive BP = 124/86 (97)  Had 30-beat run of symptomatic VT on Friday night.   SBP in 80s yesterday. Lasix on hold. Now feels better. SBP now 100-110.  Down 20 pounds from baseline.  Denies SOB/PND/Orthopnea. On review of tele had another long run of VT last night (55 beats).   Objective:   Weight Range:  Vital Signs:   Temp:  [97 F (36.1 C)-98.2 F (36.8 C)] 98.2 F (36.8 C) (04/13 0514) Pulse Rate:  [71-93] 87 (04/13 0514) Resp:  [18] 18 (04/13 0514) BP: (87-109)/(54-76) 109/76 mmHg (04/13 0514) SpO2:  [94 %-99 %] 94 % (04/13 0514) Weight:  [114.896 kg (253 lb 4.8 oz)] 114.896 kg  (253 lb 4.8 oz) (04/13 0514) Last BM Date: 02/23/13  Weight change: Filed Weights   02/23/13 0533 02/24/13 0532 02/25/13 0514  Weight: 116.7 kg (257 lb 4.4 oz) 114.2 kg (251 lb 12.3 oz) 114.896 kg (253 lb 4.8 oz)    Intake/Output:   Intake/Output Summary (Last 24 hours) at 02/25/13 0835 Last data filed at 02/25/13 0500  Gross per 24 hour  Intake    960 ml  Output   2600 ml  Net  -1640 ml     Physical Exam: General:  Well appearing. No resp difficulty HEENT: normal Neck: supple. JVP 5 . Carotids 2+ bilat; no bruits. No lymphadenopathy or thryomegaly appreciated. Cor: PMI nondisplaced. Regular rate & rhythm. No rubs, or murmurs. +S3 Lungs: clear Abdomen: soft, nontender, nondistended. No hepatosplenomegaly. No bruits or masses. Good bowel sounds. Extremities: no cyanosis, clubbing, rash, edema SCDs on bilateral lower extremities  Neuro: alert & orientedx3, cranial nerves grossly intact. moves all 4 extremities w/o difficulty. Affect pleasant   Telemetry: SR NSVT  Labs: Basic Metabolic Panel:  Recent Labs Lab 02/21/13 0602 02/22/13 0610 02/22/13 1415 02/23/13 0540 02/24/13 0209 02/24/13 0845 02/25/13 0535  NA 137 137  --  132*  --  129* 131*  K 3.6 3.3*  --  3.5 4.0 3.8 3.8  CL 102 100  --  95*  --  90* 93*  CO2 24 26  --  28  --  27 23  GLUCOSE 103* 94  --  108*  --  188* 114*  BUN 18 19  --  25*  --  31* 35*  CREATININE 1.24 1.32 1.35 1.48*  --  1.35 1.32  CALCIUM 9.0 9.2  --  9.5  --  9.4 9.5  MG  --   --   --  2.0 2.2  --   --     Liver Function Tests:  Recent Labs Lab 02/20/13 1450  AST 25  ALT 29  ALKPHOS 73  BILITOT 1.5*  PROT 6.9  ALBUMIN 3.4*   No results found for this basename: LIPASE, AMYLASE,  in the last 168 hours No results found for this basename: AMMONIA,  in the last 168 hours  CBC:  Recent Labs Lab 02/20/13 1450 02/22/13 1415  WBC 7.5 5.7  HGB 13.1 14.0  HCT 38.3* 39.4  MCV 81.5 80.9  PLT 247 242    Cardiac Enzymes: No  results found for this basename: CKTOTAL, CKMB, CKMBINDEX, TROPONINI,  in the last 168 hours  BNP: BNP (last 3 results)  Recent Labs  01/22/13 0512 01/24/13 1102 02/20/13 1450  PROBNP 1853.0* 3126.0* 2002.0*     Imaging: No results found.   Medications:     Scheduled Medications: . aspirin EC  81 mg Oral Daily  . carvedilol  6.25 mg Oral BID WC  . enoxaparin (LOVENOX) injection  40 mg Subcutaneous Q24H  . hydrALAZINE  25 mg Oral Q8H  . isosorbide mononitrate  30 mg Oral Daily  . lisinopril  5 mg Oral Daily  . sodium chloride  3 mL Intravenous Q12H    Infusions:    PRN Medications: sodium chloride, acetaminophen, guaiFENesin-dextromethorphan, ondansetron (ZOFRAN) IV, sodium chloride   Assessment:   1. A/C systolic heart failure - ECHO 11/6107 EF 15%  2. VT 3. Chronic Renal Failure - creatinine baseline 1.3-1.4  4. 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 Noncompliance    Plan/Discussion:    Continues with prominent runs of VT. LifeVest being placed. Will keep in house and start IV amio. Will discuss with EP re timing of ICD given relapsed HF. Supp K+  Volume status looks good. Resume lasix.    Truman Hayward 8:35 AM

## 2013-02-25 NOTE — Progress Notes (Signed)
Called by nurse who reports Douglas [name] is feeling anxious and would like treatment. Spoke with Dr. [name] who recommended Valium 2.5 mg q6 hours PRN. Order entered.

## 2013-02-26 DIAGNOSIS — I428 Other cardiomyopathies: Secondary | ICD-10-CM

## 2013-02-26 LAB — BASIC METABOLIC PANEL
BUN: 34 mg/dL — ABNORMAL HIGH (ref 6–23)
CO2: 24 mEq/L (ref 19–32)
Chloride: 91 mEq/L — ABNORMAL LOW (ref 96–112)
GFR calc Af Amer: 79 mL/min — ABNORMAL LOW (ref >90)
Potassium: 4.4 mEq/L (ref 3.5–5.1)

## 2013-02-26 MED ORDER — CARVEDILOL 12.5 MG PO TABS
12.5000 mg | ORAL_TABLET | Freq: Two times a day (BID) | ORAL | Status: DC
  Filled 2013-02-26 (×2): qty 1

## 2013-02-26 MED ORDER — CARVEDILOL 3.125 MG PO TABS: 3.1250 mg | ORAL_TABLET | Freq: Two times a day (BID) | ORAL | Status: DC

## 2013-02-26 MED ORDER — HYDRALAZINE HCL 25 MG PO TABS: 25.0000 mg | ORAL_TABLET | Freq: Three times a day (TID) | ORAL | Status: DC

## 2013-02-26 MED ORDER — AMIODARONE HCL 200 MG PO TABS
200.0000 mg | ORAL_TABLET | Freq: Every day | ORAL | Status: DC
  Administered 2013-02-26: 200 mg via ORAL
  Filled 2013-02-26: qty 1

## 2013-02-26 MED ORDER — CARVEDILOL 6.25 MG PO TABS
9.3750 mg | ORAL_TABLET | Freq: Two times a day (BID) | ORAL | Status: DC
  Administered 2013-02-26: 9.375 mg via ORAL
  Filled 2013-02-26 (×2): qty 1

## 2013-02-26 MED ORDER — AMIODARONE HCL 200 MG PO TABS: 200.0000 mg | ORAL_TABLET | Freq: Every day | ORAL | Status: DC

## 2013-02-26 MED ORDER — CARVEDILOL 6.25 MG PO TABS: 6.2500 mg | ORAL_TABLET | Freq: Two times a day (BID) | ORAL | Status: DC

## 2013-02-26 NOTE — Progress Notes (Signed)
Pt fit for life vest, pt being d/c'd to home, pt given dc instructions, medication review, and follow up appointments given, pt verbalized understanding, pt leaving via wheelchair, pt stable

## 2013-02-26 NOTE — Consult Note (Addendum)
ELECTROPHYSIOLOGY CONSULT NOTE  Patient ID: Douglas [name] MRN: [id number], DOB/AGE: Aug 01, 1976   Admit date: 02/20/2013 Date of Consult: 02/26/2013  Primary Physician: None Primary Cardiologist: Douglas Romney, MD Reason for Consultation: VT  History of Present Illness Douglas [name] is a pleasant 37 year old man with a NICM, initially diagnosed in 2006 (EF 20-25% at that time but had normalized to 55% by echo late 2013), normal cors s/p cath in 2006, HTN and medical noncompliance who was admitted a few days ago with acute systolic HF. He reports he has not taken his cardiac medications in nearly one year. He developed DOE, LE swelling, 4 pillow orthopnea and PND 1-2 weeks ago which was worsening, prompting his admission. He denies CP or syncope. He has now been diuresed with improvement in his symptoms. Repeat echo shows EF is now 15%. In addition, he has had several runs of NSVT noted on telemetry. Most of these episodes have been asymptomatic. He was scheduled for discharge with LifeVest; however, he had a 50-beat run of VT yesterday with symptoms of racing palpitations and lightheadedness. He was started on IV amiodarone infusion, his DC canceled and EP asked to evaluate him today for possible ICD therapy.  Past Medical History Past Medical History  Diagnosis Date  . Hypertension   . Hyperlipidemia   . Systolic CHF, chronic   . Obesity   . Nonischemic cardiomyopathy     a.  echo 4/06: EF 30%, mild to mod MR, mild RAE, inf HK, lat HK , ant HK;    b.  cath 4/06: no CAD, EF 20-25%  . ED (erectile dysfunction)   . NSVT (nonsustained ventricular tachycardia)     eval by EP in past; no ICD candidate due to NYHA 1 symptoms  . Shortness of breath     Past Surgical History Past Surgical History  Procedure Laterality Date  . Cardiac catheterization  09/2011  . Multiple extractions with alveoloplasty N/A 01/26/2013    Procedure:  EXTRACION  TOOTH # 19 WITH ALVEOLOPLASTY;  Surgeon: Douglas [name], DDS;  Location: [facility] OR;  Service: Oral Surgery;  Laterality: N/A;     Allergies/Intolerances Allergies  Allergen Reactions  . Penicillins Other (See Comments)    Unknown childhood reaction     Inpatient Medications . aspirin EC  81 mg Oral Daily  . carvedilol  6.25 mg Oral BID WC  . enoxaparin (LOVENOX) injection  40 mg Subcutaneous Q24H  . furosemide  80 mg Intravenous BID  . hydrALAZINE  25 mg Oral Q8H  . isosorbide mononitrate  30 mg Oral Daily  . lisinopril  5 mg Oral Daily  . potassium chloride  20 mEq Oral BID  . sodium chloride  3 mL Intravenous Q12H   . amiodarone (NEXTERONE PREMIX) 360 mg/200 mL dextrose 30 mg/hr (02/25/13 1535)    Family History Family History  Problem Relation Age of Onset  . Coronary artery disease Mother 85    s/p PCI  . Lung cancer Father   . Diabetes type II Maternal Uncle   . Coronary artery disease Maternal Uncle      Social History Social History  . Marital Status: Legally Separated   Occupational History  . unemployed    Social History Main Topics  . Smoking status: Current Every Day Smoker -- 0.30 packs/day for 4 years    Types: Cigarettes  . Smokeless tobacco: Never Used  . Alcohol Use: 3.6 oz/week    6 Cans of beer per week  Comment: 06/22/2012 "maybe 6 pk beer/wk"  . Drug Use: Yes    Special: Cocaine     Comment: 06/22/2012 "last cocaine ~ 6 months ago"  Last use of cocaine in October 2013   Review of Systems General: No chills, fever, night sweats or weight changes  Cardiovascular:  No chest pain, dyspnea on exertion, edema, orthopnea, palpitations, paroxysmal nocturnal dyspnea Dermatological: No rash, lesions or masses Respiratory: No cough, dyspnea Urologic: No hematuria, dysuria Abdominal: No nausea, vomiting, diarrhea, bright red blood per rectum, melena, or hematemesis Neurologic: No visual changes, weakness, changes in mental status All other systems reviewed and are otherwise negative except as noted  above.  Physical Exam Blood pressure 108/64, pulse 83, temperature 98.1 F (36.7 C), temperature source Oral, resp. rate 18, height 6\' 4"  (1.93 m), weight 254 lb (115.214 kg), SpO2 95.00%.  General: Well developed, well appearing 37 year old male in no acute distress. HEENT: Normocephalic, atraumatic. EOMs intact. Sclera nonicteric. Oropharynx clear.  Neck: Supple without bruits. No JVD. Lungs: Respirations regular and unlabored, CTA bilaterally. No wheezes, rales or rhonchi. Heart: RRR. S1, S2 present. No murmurs, rub, S3 or S4. Abdomen: Soft, non-tender, non-distended. BS present x 4 quadrants. No hepatosplenomegaly.  Extremities: No clubbing, cyanosis or edema. DP/PT/Radials 2+ and equal bilaterally. Psych: Normal affect. Neuro: Alert and oriented X 3. Moves all extremities spontaneously. Musculoskeletal: No kyphosis. Skin: Intact. Warm and dry. No rashes or petechiae in exposed areas.   Labs No results found for this basename: CKTOTAL, CKMB, TROPONINI,  in the last 72 hours Lab Results  Component Value Date   WBC 5.7 02/22/2013   HGB 14.0 02/22/2013   HCT 39.4 02/22/2013   MCV 80.9 02/22/2013   PLT 242 02/22/2013    Recent Labs Lab 02/20/13 1450  02/26/13 0515  NA 137  < > 127*  K 4.1  < > 4.4  CL 102  < > 91*  CO2 23  < > 24  BUN 15  < > 34*  CREATININE 1.26  < > 1.30  CALCIUM 9.1  < > 9.2  PROT 6.9  --   --   BILITOT 1.5*  --   --   ALKPHOS 73  --   --   ALT 29  --   --   AST 25  --   --   GLUCOSE 122*  < > 122*  < > = values in this interval not displayed.   Radiology/Studies Dg Chest Port 1 View  02/21/2013  *RADIOLOGY REPORT*  Clinical Data: Dyspnea  PORTABLE CHEST - 1 VIEW  Comparison: 01/20/2013  Findings: Cardiomegaly with pulmonary vascular congestion.  No frank interstitial edema.  Left lung base is not well visualized, although this is favored to be secondary to portable technique.  No pneumothorax.  IMPRESSION: Cardiomegaly with pulmonary vascular  congestion.  No frank interstitial edema.   Original Report Authenticated By: Charline Bills, M.D.     Echocardiogram  Study Conclusions - Left ventricle: The cavity size was severely dilated. Wall thickness was increased in a pattern of mild LVH. Systolic function was severely reduced. The estimated ejection fraction was 15%. Diffuse hypokinesis. Doppler parameters are consistent with restrictive physiology, indicative of decreased left ventricular diastolic compliance and/or increased left atrial pressure. - Mitral valve: Mild regurgitation. - Left atrium: The atrium was moderately dilated. - Right ventricle: The cavity size was severely dilated. Systolic function was severely reduced. - Right atrium: The atrium was moderately to severely dilated. - Tricuspid valve:  Mild-moderate regurgitation. - Pulmonary arteries: PA peak pressure: 45mm Hg (S).   12-lead ECG from 01/20/2013 shows sinus tachycardia at 106 bpm; QRS duration 96 msec Telemetry shows SR with frequent NSVT, mostly 6-8 beats in duration   Assessment and Plan 1. NICM, EF 15% 2. NSVT 3. Medical noncompliance Mr. Moorman presents with acute systolic HF and NICM, off medical therapy for one year. He has also had NSVT while here. At this time, we recommend optimization of his medical therapy over the next several weeks, specifically up-titration of his carvedilol. Given that he has just restarted medical therapy for NICM, he does not meed criteria for ICD. However, we recommend LifeVest at discharge and follow-up echo in 3 months to reassess his LV function. He was counseled regarding the importance of compliance.   Dr. Johney Frame to see Signed, Rick Duff, PA-C 02/26/2013, 7:31 AM  I have seen, examined the patient, and reviewed the above assessment and plan.  Changes to above are made where necessary.  Pt with nonischemic CM with EF previously recovered (though LVEDD was 60 at the time.)  He has been noncompliant  with CHF outpatient follow-up and management.  He reports noncompliance with CHF medicines for the past year. He is now admitted with worsening CHF, depressed EF, and NSVT. At this point, he is not a candidate for implantable defibrillation implantation due to noncompliance.  I do think that a lifevest is reasonable at discharge to allow for him to demonstrate compliance and to see if his EF will recover with optimization of medical therapy.  I would prefer aggressive titration of coreg over amiodarone therapy for NSVT given his young age.  I will therefore increase coreg at this time to 9.375mg  BID.  I will convert amiodarone to PO.  If he remains stable, then amiodarone can be discontinued in 4 weeks (once coreg has been further titrated). I will see as needed while here.  Co Sign: Hillis Range, MD 02/26/2013 7:57 AM

## 2013-02-26 NOTE — Progress Notes (Signed)
02/26/13 1049 Noted new CM referral for LifeVest. TC to Morrie Sheldon, LifeVest representative [phone]) to give referral.  Today, Amiodarone gtt was changed to po.  NCM will follow for further dc needs.  Tera Mater, RN, BSN NCM [phone]

## 2013-02-26 NOTE — Progress Notes (Signed)
Advanced Heart Failure Rounding Note   Subjective:   Douglas [name] is a 37 y.o. male with a h/o HTN, HL, obesity and CHF due to NICM. Management has been complicated by severe non-compliance.  Underwent cath in 2006 which showed normal cors with EF 20-25%. EF had recovered in late 2013 but has again fallen to 15% per echo 01/20/13.  August 2012 CPX text VO2 20   He was recently admitted for heart failure exacerbation with low output symptoms. Repeat echo showed decline in EF from 55% to 15%. Discharge weight was 268 pounds. He states that he only felt well for approximately 1 day after discharge and then he began to swell again and has c/o progressive dyspnea. He +orthopnea and PND. He hasn't been sleeping because he can't sleep sitting up. He says he can't eat due to feeling so full. Urine output has declined. Weight is up 12 pounds in the last 2 weeks. .    Admitted from HF clinic with volume overload and NYHA class IV symptoms. Admit Pro BNP 2002 Creatinine 1.26. He was started on IV lasix.  Due to sluggish diuresis he was taken to cath lab to further evaluate hemodynamics and received IV lasix and Metolazone.    RHC 02/22/13 RA = 12  RV = 49/6/15  PA = 54/29 (41)  PCW = 28 (v= 40)  Fick cardiac output/index = 4.3/1.7  PVR = 2.6 Woods  SVR = 1580 dynes  FA sat = 98%  PA sat = 53%, 54%  Non-invasive BP = 124/86 (97)  Had 30-beat run of symptomatic VT on Friday night. And 55 beat run on Saturday night. Amio started  Seen by Dr. Johney Frame today who agreed with LifeVest (not candidate for implantable ICD just yet due to noncompliance and previous recovery of LV function).   Feels ok. Weight stable. Brief NSVT last night.   Objective:   Weight Range:  Vital Signs:   Temp:  [97.4 F (36.3 C)-98.1 F (36.7 C)] 97.5 F (36.4 C) (04/14 1204) Pulse Rate:  [78-85] 81 (04/14 1204) Resp:  [17-19] 17 (04/14 1204) BP: (94-109)/(64-73) 104/68 mmHg (04/14 1204) SpO2:  [95 %-99 %] 95 %  (04/14 1204) Weight:  [115.214 kg (254 lb)] 115.214 kg (254 lb) (04/14 0406) Last BM Date: 02/25/13  Weight change: Filed Weights   02/24/13 0532 02/25/13 0514 02/26/13 0406  Weight: 114.2 kg (251 lb 12.3 oz) 114.896 kg (253 lb 4.8 oz) 115.214 kg (254 lb)    Intake/Output:   Intake/Output Summary (Last 24 hours) at 02/26/13 1351 Last data filed at 02/26/13 1100  Gross per 24 hour  Intake    720 ml  Output   3125 ml  Net  -2405 ml     Physical Exam: General: No resp difficulty HEENT: normal Neck: supple. JVP 5 . Carotids 2+ bilat; no bruits. No lymphadenopathy or thryomegaly appreciated. Cor: PMI nondisplaced. Regular rate & rhythm. No rubs, or murmurs. +S3 Lungs: clear Abdomen: soft, nontender, nondistended. No hepatosplenomegaly. No bruits or masses. Good bowel sounds. Extremities: no cyanosis, clubbing, rash, edema SCDs on bilateral lower extremities  Neuro: alert & orientedx3, cranial nerves grossly intact. moves all 4 extremities w/o difficulty. Affect pleasant   Telemetry: SR 5-beat SVT  Labs: Basic Metabolic Panel:  Recent Labs Lab 02/22/13 0610 02/22/13 1415 02/23/13 0540 02/24/13 0209 02/24/13 0845 02/25/13 0535 02/26/13 0515  NA 137  --  132*  --  129* 131* 127*  K 3.3*  --  3.5 4.0  3.8 3.8 4.4  CL 100  --  95*  --  90* 93* 91*  CO2 26  --  28  --  27 23 24   GLUCOSE 94  --  108*  --  188* 114* 122*  BUN 19  --  25*  --  31* 35* 34*  CREATININE 1.32 1.35 1.48*  --  1.35 1.32 1.30  CALCIUM 9.2  --  9.5  --  9.4 9.5 9.2  MG  --   --  2.0 2.2  --   --   --     Liver Function Tests:  Recent Labs Lab 02/20/13 1450  AST 25  ALT 29  ALKPHOS 73  BILITOT 1.5*  PROT 6.9  ALBUMIN 3.4*   No results found for this basename: LIPASE, AMYLASE,  in the last 168 hours No results found for this basename: AMMONIA,  in the last 168 hours  CBC:  Recent Labs Lab 02/20/13 1450 02/22/13 1415  WBC 7.5 5.7  HGB 13.1 14.0  HCT 38.3* 39.4  MCV 81.5 80.9  PLT  247 242    Cardiac Enzymes: No results found for this basename: CKTOTAL, CKMB, CKMBINDEX, TROPONINI,  in the last 168 hours  BNP: BNP (last 3 results)  Recent Labs  01/22/13 0512 01/24/13 1102 02/20/13 1450  PROBNP 1853.0* 3126.0* 2002.0*     Imaging: No results found.   Medications:     Scheduled Medications: . amiodarone  200 mg Oral Daily  . aspirin EC  81 mg Oral Daily  . carvedilol  9.375 mg Oral BID WC  . enoxaparin (LOVENOX) injection  40 mg Subcutaneous Q24H  . furosemide  80 mg Intravenous BID  . hydrALAZINE  25 mg Oral Q8H  . isosorbide mononitrate  30 mg Oral Daily  . lisinopril  5 mg Oral Daily  . potassium chloride  20 mEq Oral BID  . sodium chloride  3 mL Intravenous Q12H    Infusions:    PRN Medications: sodium chloride, acetaminophen, diazepam, guaiFENesin-dextromethorphan, ondansetron (ZOFRAN) IV, sodium chloride   Assessment:   1. A/C systolic heart failure - ECHO 0/4540 EF 15%  2. VT 3. Chronic Renal Failure - creatinine baseline 1.3-1.4  4. 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 Noncompliance  6. Hyponatremia/Hypokalemia  Plan/Discussion:    Overall stable. However he has multiple high-risk features with his HF (Hyponatremia, low bp, recurrent VT). Will plan d/c home today with LifeVest. Will need close f/u in HF clinic. Stressed need for compliance with medication regimen. Will increase carvedilol to 9.375 bid.    Frank Pilger,MD 1:51 PM

## 2013-02-28 ENCOUNTER — Encounter (HOSPITAL_COMMUNITY): Payer: Self-pay

## 2013-03-06 ENCOUNTER — Ambulatory Visit (HOSPITAL_COMMUNITY)
Admit: 2013-03-06 | Discharge: 2013-03-06 | Disposition: A | Discharge status: Home / Self Care | Payer: Medicaid Other | Attending: Internal Medicine | Admitting: Internal Medicine

## 2013-03-06 VITALS — BP 102/78 | HR 96 | Wt 291.0 lb

## 2013-03-06 DIAGNOSIS — I5022 Chronic systolic (congestive) heart failure: Secondary | ICD-10-CM | POA: Insufficient documentation

## 2013-03-06 DIAGNOSIS — I472 Ventricular tachycardia: Secondary | ICD-10-CM

## 2013-03-06 DIAGNOSIS — I509 Heart failure, unspecified: Secondary | ICD-10-CM | POA: Insufficient documentation

## 2013-03-06 LAB — BASIC METABOLIC PANEL
CO2: 26 mEq/L (ref 19–32)
Chloride: 99 mEq/L (ref 96–112)
Creatinine, Ser: 1.2 mg/dL (ref 0.50–1.35)

## 2013-03-06 MED ORDER — LISINOPRIL 5 MG PO TABS: 5.0000 mg | ORAL_TABLET | Freq: Two times a day (BID) | ORAL | Status: DC

## 2013-03-06 NOTE — Assessment & Plan Note (Signed)
No LifeVest alarms, continue amiodarone 200 mg daily.

## 2013-03-06 NOTE — Progress Notes (Addendum)
History of Present Illness:  Douglas [name] is a 37 y.o. male with a h/o HTN, HL, obesity and CHF due to NICM. Management has been complicated by severe non-compliance.   Underwent cath in 2006 which showed normal cors with EF 20-25%.   EF had recovered in late 2013 but has again fallen to 15% per echo 01/20/13.  August 2012 CPX text VO2 20  He was admitted from clinic in early April for low output symptoms and volume overload.  He required IV lasix and metolazone.  He diuresed 19 pounds with discharge weight of 254 pounds.  He had runs of VT therefore he was started on amiodarone per EP and Lifevest was placed at discharge.  He underwent RHC on 02/22/13  RHC 02/22/13  RA = 12  RV = 49/6/15  PA = 54/29 (41)  PCW = 28 (v= 40)  Fick cardiac output/index = 4.3/1.7  PVR = 2.6 Woods  SVR = 1580 dynes  FA sat = 98%  PA sat = 53%, 54%  Non-invasive BP = 124/86 (97)  He returns for post hospital follow up.  He says he is feeling ok.  He has had some increased edema since discharge and has been trying to increase lasix to TID without much change.  He doesn't feel like he is urinating much.  + orthopnea.  No PND.  + lower extremity edema.  He says he is compliant with meds.  Wearing LifeVest and no alarms.     Past Medical History  Diagnosis Date  . Hypertension   . Hyperlipidemia   . Systolic CHF, chronic   . Obesity   . Nonischemic cardiomyopathy     a.  echo 4/06: EF 30%, mild to mod MR, mild RAE, inf HK, lat HK , ant HK;    b.  cath 4/06: no CAD, EF 20-25%  . ED (erectile dysfunction)   . NSVT (nonsustained ventricular tachycardia)     eval by EP in past; no ICD candidate due to NYHA 1 symptoms  . Shortness of breath     Current Outpatient Prescriptions  Medication Sig Dispense Refill  . amiodarone (PACERONE) 200 MG tablet Take 1 tablet (200 mg total) by mouth daily.  30 tablet  3  . aspirin EC 81 MG tablet Take 81 mg by mouth daily.      . carvedilol (COREG) 6.25 MG tablet Take  1 tablet (6.25 mg total) by mouth 2 (two) times daily with a meal.  60 tablet  3  . furosemide (LASIX) 80 MG tablet Take 80 mg by mouth 2 (two) times daily.      . hydrALAZINE (APRESOLINE) 25 MG tablet Take 1 tablet (25 mg total) by mouth 3 (three) times daily.  90 tablet  3  . HYDROcodone-acetaminophen (NORCO/VICODIN) 5-325 MG per tablet Take 1-2 tablets by mouth every 6 (six) hours as needed.  20 tablet  0  . isosorbide mononitrate (IMDUR) 30 MG 24 hr tablet Take 30 mg by mouth daily.      Marland Kitchen lisinopril (PRINIVIL,ZESTRIL) 5 MG tablet Take 5 mg by mouth daily.      . potassium chloride SA (K-DUR,KLOR-CON) 20 MEQ tablet Take 20 mEq by mouth daily.       No current facility-administered medications for this encounter.    Allergies  Allergen Reactions  . Penicillins Other (See Comments)    Unknown childhood reaction     ROS:  Please see the history of present illness.  He denies fevers,  chills, melena, hematochezia.  All other systems reviewed and negative.  Vital Signs: Filed Vitals:   03/06/13 1521  BP: 102/78  Pulse: 96  Weight: 291 lb (131.997 kg)  SpO2: 97%    PHYSICAL EXAM: Well nourished, well developed, in no acute distress HEENT: normal Neck: JVP 7-8. Carotids 2+ bilaterally.No Bruits  Cardiac:  PMI laterally displaced. Tachycardic with regular rhythm, +S3 Lungs:  clear to auscultation bilaterally, no wheezing, rhonchi or rales Ab: obese. Soft NT.  Mild distention.  No bruits.. Ext warm. no cyanosis or clubbing, 1-2+ bilateral lower extremity edema Skin: warm and dry Psych:normal affect. CNII-XII grossly intact  ASSESSMENT AND PLAN:

## 2013-03-06 NOTE — Assessment & Plan Note (Signed)
Volume status elevated although he denies dietary indiscretions.  Will stop lasix and start torsemide as he feels most of his edema is in his gut.  Will also increase lisinopril to 5 mg BID for better afterload reduction.  Have discussed sliding scale diuretics and he voices understanding.  Will continue to follow closely in clinic.  Will need to continue LifeVest until he reaches 3 months of optimal medical therapy.  Will discuss implantable ICD at that time. Follow up 2 weeks.  BMET today.

## 2013-03-06 NOTE — Patient Instructions (Signed)
Stop lasix.  Start torsemide 2 tabs twice daily  Increase lisinopril 5 mg twice daily  Follow up 3 weeks.

## 2013-03-13 ENCOUNTER — Encounter (HOSPITAL_COMMUNITY): Payer: Self-pay | Admitting: Emergency Medicine

## 2013-03-13 ENCOUNTER — Emergency Department (HOSPITAL_COMMUNITY)
Admission: EM | Admit: 2013-03-13 | Discharge: 2013-03-13 | Disposition: A | Discharge status: Home / Self Care | Payer: Medicaid Other | Attending: Emergency Medicine | Admitting: Emergency Medicine

## 2013-03-13 ENCOUNTER — Emergency Department (HOSPITAL_COMMUNITY): Payer: Medicaid Other

## 2013-03-13 DIAGNOSIS — R110 Nausea: Secondary | ICD-10-CM | POA: Insufficient documentation

## 2013-03-13 DIAGNOSIS — R1084 Generalized abdominal pain: Secondary | ICD-10-CM | POA: Insufficient documentation

## 2013-03-13 DIAGNOSIS — E785 Hyperlipidemia, unspecified: Secondary | ICD-10-CM | POA: Insufficient documentation

## 2013-03-13 DIAGNOSIS — R05 Cough: Secondary | ICD-10-CM | POA: Insufficient documentation

## 2013-03-13 DIAGNOSIS — R799 Abnormal finding of blood chemistry, unspecified: Secondary | ICD-10-CM | POA: Insufficient documentation

## 2013-03-13 DIAGNOSIS — R0602 Shortness of breath: Secondary | ICD-10-CM | POA: Insufficient documentation

## 2013-03-13 DIAGNOSIS — I5023 Acute on chronic systolic (congestive) heart failure: Secondary | ICD-10-CM | POA: Insufficient documentation

## 2013-03-13 DIAGNOSIS — Z9861 Coronary angioplasty status: Secondary | ICD-10-CM | POA: Insufficient documentation

## 2013-03-13 DIAGNOSIS — Z79899 Other long term (current) drug therapy: Secondary | ICD-10-CM | POA: Insufficient documentation

## 2013-03-13 DIAGNOSIS — Z8679 Personal history of other diseases of the circulatory system: Secondary | ICD-10-CM | POA: Insufficient documentation

## 2013-03-13 DIAGNOSIS — R059 Cough, unspecified: Secondary | ICD-10-CM | POA: Insufficient documentation

## 2013-03-13 DIAGNOSIS — R7989 Other specified abnormal findings of blood chemistry: Secondary | ICD-10-CM

## 2013-03-13 DIAGNOSIS — I10 Essential (primary) hypertension: Secondary | ICD-10-CM | POA: Insufficient documentation

## 2013-03-13 DIAGNOSIS — Z87448 Personal history of other diseases of urinary system: Secondary | ICD-10-CM | POA: Insufficient documentation

## 2013-03-13 DIAGNOSIS — Z7982 Long term (current) use of aspirin: Secondary | ICD-10-CM | POA: Insufficient documentation

## 2013-03-13 DIAGNOSIS — F17200 Nicotine dependence, unspecified, uncomplicated: Secondary | ICD-10-CM | POA: Insufficient documentation

## 2013-03-13 LAB — HEPATIC FUNCTION PANEL
ALT: 25 U/L (ref 0–53)
AST: 24 U/L (ref 0–37)
Total Protein: 6.6 g/dL (ref 6.0–8.3)

## 2013-03-13 LAB — BASIC METABOLIC PANEL
CO2: 29 mEq/L (ref 19–32)
Glucose, Bld: 120 mg/dL — ABNORMAL HIGH (ref 70–99)
Potassium: 4 mEq/L (ref 3.5–5.1)
Sodium: 135 mEq/L (ref 135–145)

## 2013-03-13 LAB — POCT I-STAT TROPONIN I: Troponin i, poc: 0.07 ng/mL (ref 0.00–0.08)

## 2013-03-13 LAB — CBC
Hemoglobin: 12.7 g/dL — ABNORMAL LOW (ref 13.0–17.0)
MCH: 28 pg (ref 26.0–34.0)
MCV: 80.6 fL (ref 78.0–100.0)
RBC: 4.53 MIL/uL (ref 4.22–5.81)

## 2013-03-13 MED ORDER — HYDROMORPHONE HCL PF 1 MG/ML IJ SOLN
1.0000 mg | Freq: Once | INTRAMUSCULAR | Status: AC
  Administered 2013-03-13: 1 mg via INTRAVENOUS
  Filled 2013-03-13: qty 1

## 2013-03-13 MED ORDER — POTASSIUM CHLORIDE CRYS ER 20 MEQ PO TBCR
40.0000 meq | EXTENDED_RELEASE_TABLET | Freq: Once | ORAL | Status: AC
  Administered 2013-03-13: 40 meq via ORAL
  Filled 2013-03-13: qty 2

## 2013-03-13 MED ORDER — ONDANSETRON HCL 4 MG/2ML IJ SOLN
4.0000 mg | INTRAMUSCULAR | Status: AC
  Administered 2013-03-13: 4 mg via INTRAVENOUS
  Filled 2013-03-13: qty 2

## 2013-03-13 MED ORDER — FUROSEMIDE 10 MG/ML IJ SOLN
80.0000 mg | Freq: Once | INTRAMUSCULAR | Status: AC
  Administered 2013-03-13: 80 mg via INTRAVENOUS
  Filled 2013-03-13: qty 8

## 2013-03-13 MED ORDER — METOLAZONE 5 MG PO TABS
5.0000 mg | ORAL_TABLET | Freq: Once | ORAL | Status: AC
  Administered 2013-03-13: 5 mg via ORAL
  Filled 2013-03-13: qty 1

## 2013-03-13 NOTE — ED Provider Notes (Signed)
History     CSN: [id number]  Arrival date & time 03/13/13  0904   First MD Initiated Contact with Patient 03/13/13 [phone]      Chief Complaint  Patient presents with  . Leg Swelling  . Abdominal Pain    (Consider location/radiation/quality/duration/timing/severity/associated sxs/prior treatment) HPI Comments: Patient is a 37 year old male with a history of hypertension, CHF, and nonischemic cardiomyopathy who presents for shortness of breath, abdominal swelling and bilateral leg swelling x3 days. Patient states that the swelling is constant, has been gradually worsening over the last 3 days, and is accompanied by a throbbing sensation. Patient denies any aggravating or alleviating factors; not relieved with elevation. Patient also states "when I stand up I can see my belly grow in front of me". Associated symptoms include nausea. Patient was recently discharged on 02/27/2013 after a 7 day hospitalization for A/C systolic heart failure; d/c weight was 254 after diuresis.  Most recent echo on 02/18/2013 with EF of 30%; cath on 02/18/2013 without CAD, EF 20-25%. Baseline Cr 1.3-1.4 per most recent d/c note. Cardiologist - Dr. Milas Kocher. Patient recently switch from Lasix to Torsemide 40mg  BID.  Patient is a 37 y.o. male presenting with abdominal pain. The history is provided by the patient. No language interpreter was used.  Abdominal Pain Associated symptoms: cough, nausea and shortness of breath   Associated symptoms: no constipation, no diarrhea, no dysuria, no fever, no hematuria and no vomiting     Past Medical History  Diagnosis Date  . Hypertension   . Hyperlipidemia   . Systolic CHF, chronic   . Obesity   . Nonischemic cardiomyopathy     a.  echo 4/06: EF 30%, mild to mod MR, mild RAE, inf HK, lat HK , ant HK;    b.  cath 4/06: no CAD, EF 20-25%  . ED (erectile dysfunction)   . NSVT (nonsustained ventricular tachycardia)     eval by EP in past; no ICD candidate due to NYHA 1  symptoms  . Shortness of breath     Past Surgical History  Procedure Laterality Date  . Cardiac catheterization  09/2011  . Multiple extractions with alveoloplasty N/A 01/26/2013    Procedure:  EXTRACION  TOOTH # 19 WITH ALVEOLOPLASTY;  Surgeon: Charlynne Pander, DDS;  Location: [facility] OR;  Service: Oral Surgery;  Laterality: N/A;    Family History  Problem Relation Age of Onset  . Coronary artery disease Mother 72    s/p PCI  . Lung cancer Father   . Diabetes type II Maternal Uncle   . Coronary artery disease Maternal Uncle     History  Substance Use Topics  . Smoking status: Current Every Day Smoker -- 0.30 packs/day for 4 years    Types: Cigarettes  . Smokeless tobacco: Never Used  . Alcohol Use: 3.6 oz/week    6 Cans of beer per week     Comment: 06/22/2012 "maybe 6 pk beer/wk"      Review of Systems  Constitutional: Negative for fever.  Eyes: Negative for visual disturbance.  Respiratory: Positive for cough and shortness of breath.   Cardiovascular: Positive for leg swelling.  Gastrointestinal: Positive for nausea and abdominal pain. Negative for vomiting, diarrhea, constipation and blood in stool.  Genitourinary: Negative for dysuria and hematuria.  Skin: Negative for color change and pallor.  All other systems reviewed and are negative.    Allergies  Penicillins  Home Medications   Current Outpatient Rx  Name  Route  Sig  Dispense  Refill  . amiodarone (PACERONE) 200 MG tablet   Oral   Take 1 tablet (200 mg total) by mouth daily.   30 tablet   3   . aspirin EC 81 MG tablet   Oral   Take 81 mg by mouth daily.         . carvedilol (COREG) 6.25 MG tablet   Oral   Take 1 tablet (6.25 mg total) by mouth 2 (two) times daily with a meal.   60 tablet   3   . hydrALAZINE (APRESOLINE) 25 MG tablet   Oral   Take 1 tablet (25 mg total) by mouth 3 (three) times daily.   90 tablet   3   . HYDROcodone-acetaminophen (NORCO/VICODIN) 5-325 MG per tablet    Oral   Take 1-2 tablets by mouth every 6 (six) hours as needed.   20 tablet   0   . isosorbide mononitrate (IMDUR) 30 MG 24 hr tablet   Oral   Take 30 mg by mouth daily.         Marland Kitchen lisinopril (PRINIVIL,ZESTRIL) 5 MG tablet   Oral   Take 1 tablet (5 mg total) by mouth 2 (two) times daily.         . potassium chloride SA (K-DUR,KLOR-CON) 20 MEQ tablet   Oral   Take 20 mEq by mouth daily.           BP 97/65  Pulse 79  Temp(Src) 97.6 F (36.4 C) (Oral)  Resp 16  SpO2 96%  Physical Exam  Nursing note and vitals reviewed. Constitutional: He is oriented to person, place, and time. He appears well-developed and well-nourished. No distress.  Patient is a morbidly obese male who is calm, pleasant and smiling, and in no acute distress.  HENT:  Head: Normocephalic and atraumatic.  Eyes: Conjunctivae and EOM are normal. Pupils are equal, round, and reactive to light. No scleral icterus.  Neck: Normal range of motion. Neck supple.  Cardiovascular: Normal rate, regular rhythm and normal heart sounds.   Distal radial, DP, and PT pulses 2+ b/l. Capillary refill normal.  Pulmonary/Chest: Effort normal and breath sounds normal. No respiratory distress. He has no wheezes. He has no rales. He exhibits no tenderness.  Abdominal:  Soft obese abdomen with generalized tenderness to palpation and without peritoneal signs. No rebound, guarding, or masses appreciated.  Musculoskeletal: Normal range of motion.   Bilateral lower extremity compartments tense, consistent with edema; pitting edema 2+ around bilateral ankles. No erythema or heat to touch to suspect infectious process.  Lymphadenopathy:    He has no cervical adenopathy.  Neurological: He is alert and oriented to person, place, and time. He has normal reflexes.  No sensory or motor deficits appreciated.  Skin: Skin is warm and dry. No rash noted. He is not diaphoretic. No erythema.  Psychiatric: He has a normal mood and affect. His  behavior is normal.    ED Course  Procedures (including critical care time)  Labs Reviewed  BASIC METABOLIC PANEL - Abnormal; Notable for the following:    Glucose, Bld 120 (*)    Creatinine, Ser 1.46 (*)    GFR calc non Af Amer 59 (*)    GFR calc Af Amer 69 (*)    All other components within normal limits  CBC - Abnormal; Notable for the following:    Hemoglobin 12.7 (*)    HCT 36.5 (*)    All other components within normal limits  PRO B  NATRIURETIC PEPTIDE - Abnormal; Notable for the following:    Pro B Natriuretic peptide (BNP) 1914.0 (*)    All other components within normal limits  HEPATIC FUNCTION PANEL - Abnormal; Notable for the following:    Albumin 3.2 (*)    All other components within normal limits  POCT I-STAT TROPONIN I - Abnormal; Notable for the following:    Troponin i, poc 0.11 (*)    All other components within normal limits  POCT I-STAT TROPONIN I   Dg Chest 2 View  03/13/2013  *RADIOLOGY REPORT*  Clinical Data: Chest pain.  Bilateral leg pain and swelling.  CHEST - 2 VIEW  Comparison: 02/21/2013  Findings: There is moderate enlargement of the cardiac silhouette, stable.  No mediastinal or hilar masses.  The lungs are clear.  No pleural effusion or pneumothorax.  No pulmonary edema.  The bony thorax and surrounding soft tissues are unremarkable.  IMPRESSION: Moderate cardiomegaly.  Clear lungs with no evidence of pulmonary edema and no pleural effusions.   Original Report Authenticated By: Amie Portland, M.D.      Date: 03/13/2013  Rate: 86  Rhythm: normal sinus rhythm  QRS Axis: normal  Intervals: QT prolonged  ST/T Wave abnormalities: nonspecific T wave changes  Conduction Disutrbances:none  Narrative Interpretation:   Old EKG Reviewed: unchanged from 01/20/2013 I have personally reviewed and interpreted this EKG.   1. Elevated brain natriuretic peptide (BNP) level   2. Acute on chronic systolic heart failure      MDM  Patient with hx of systolic  HF, nonischemic cardiomyopathy and HTN presents for shortness of breath, abdominal swelling and bilateral leg swelling x3 days. Patient recently hospitalized for 7 days for A/C systolic heart failure requiring diuresis; d/c weight on 02/27/13 of 254 pounds. Patient currently on 40mg  Torsemide BID; cardiologist Dr. Milas Kocher. On physical exam patient is well and nontoxic appearing with 2+ pitting edema in b/l ankles and firm b/l lower extremity compartments. There is also generalized TTP of abdomen without obvious ascites, fluid wave, or peritoneal signs. Neurovascularly intact. W/u to include CBC, BMP, hepatic function panel, BNP, troponin, and CXR. EKG unchanged from prior.  Troponin elevated to 0.11; second troponin ordered. Possibly secondary to heart failure; patient has hx of cardiac caths without evidence of CAD. BNP 1914 and Creatinine 1.46. Rest of labs c/w prior work ups. CXR significant for moderate cardiomegaly without evidence of PTX, pleural effusion, PNA, or pulmonary edema. Dr. Denton Lank has consulted [place] cardiology who have agreed to come see the patient.  Patient seen by [facility] cardiologist who has ordered Zaroxolyn and Lasix for the patient's symptoms. Will re-evaluate in a few hours after tx completion and determine disposition.   Filed Vitals:   03/13/13 1132 03/13/13 1200 03/13/13 1215 03/13/13 1516  BP: 95/63 102/67 97/65   Pulse: 78 87 79   Temp:      TempSrc:      Resp: 13 15 16    Weight:    287 lb (130.182 kg)  SpO2: 98% 96% 96%          Antony Madura, PA-C 03/13/13 1539

## 2013-03-13 NOTE — ED Notes (Signed)
Pt c/o leg and abd swelling with pain; pt sts hx of CHF; pt sts decrease in urine output

## 2013-03-13 NOTE — ED Notes (Signed)
Pt states that he has been having a increase in abd swelling and leg swelling x 3 days.  No distress noted.  Talking in complete sentences. Noted to have exertional dyspnea.  +3 edema noted to bilateral lower legs.  Reports having difficulty sleeping at night due abd pain that increases with deep inspiration.

## 2013-03-13 NOTE — ED Notes (Signed)
Results of troponin shown to primary nurse Wes

## 2013-03-13 NOTE — ED Notes (Signed)
PT MADE COMFORTABLE. URINALS AT BEDSIDE FOR PT CONVENIENCE. PT DENIES FEELING SOB. ABDOMEN IS SOFT TO PALPATION. BOWELS SOUNDS ACTIVE. PT GIVEN ICE CHIPS TO MOISTEN HIS MOUTH.

## 2013-03-13 NOTE — Consult Note (Addendum)
Advanced Heart Failure Team History and Physical Note   Primary Physician:  Primary Cardiologist: Dr. [name]   Reason for Admission: Volume overload and low output   Baseline proBNP: Weight Range:  HPI:    Douglas [name] is a 37 y.o. male with a h/o HTN, HL, obesity and CHF due to NICM. Management has been complicated by severe non-compliance.   Underwent cath in 2006 which showed normal cors with EF 20-25%. EF had recovered in late 2013 but has again fallen to 15% per echo 01/20/13.   He was admitted from clinic for low output symptoms and volume overload on 02/20/13.  He required IV diuresis and metolazone.  He underwent RHC, hemodynamics below.  He had runs of VT and was started on amiodarone by Dr. Johney Frame.  A lifevest was placed prior to discharge.  He diuresed 19 pounds with discharge weight 254 pounds.     RHC 02/22/13  RA = 12  RV = 49/6/15  PA = 54/29 (41)  PCW = 28 (v= 40)  Fick cardiac output/index = 4.3/1.7  PVR = 2.6 Woods  SVR = 1580 dynes  FA sat = 98%  PA sat = 53%, 54%  Non-invasive BP = 124/86 (97)  He returned for post hospital clinic visit with increased edema, +orthopnea and abdominal distention. Weight up 30+ pounds but not overly symptomatic. Lasix was changed to torsemide 40 mg BID.    He presented to the ER with c/o leg and abdominal swelling, decreased urine output and orthopnea.  He has picked up his torsemide but weight fluctuating around 291-294 pounds. Down five pounds since clinic visit.  Walking up stairs causes trouble.  Can walk on flat ground without problem.  Pertinent labs include: Cr 1.46 (up from 1.2), BUN 23, troponin POC 0.11, proBNP 1914.  CXR showed moderate cardiomegaly with no evidemce of pulmonary edema or plueral effusions.     Review of Systems: [y] = yes, [ ]  = no   General: Weight gain Cove.Etienne ]; Weight loss [ ] ; Anorexia [ ] ; Fatigue Cove.Etienne ]; Fever [ ] ; Chills [ ] ; Weakness [ ]   Cardiac: Chest pain/pressure [ ] ; Resting SOB [ ] ;  Exertional SOB Cove.Etienne ]; Orthopnea Cove.Etienne ]; Pedal Edema Cove.Etienne ]; Palpitations [ ] ; Syncope [ ] ; Presyncope [ ] ; Paroxysmal nocturnal dyspnea[ ]   Pulmonary: Cough [ ] ; Wheezing[ ] ; Hemoptysis[ ] ; Sputum [ ] ; Snoring [ ]   GI: Vomiting[ ] ; Dysphagia[ ] ; Melena[ ] ; Hematochezia [ ] ; Heartburn[ ] ; Abdominal pain [ ] ; Constipation [ ] ; Diarrhea [ ] ; BRBPR [ ]   GU: Hematuria[ ] ; Dysuria [ ] ; Nocturia[ ]   Vascular: Pain in legs with walking [ ] ; Pain in feet with lying flat [ ] ; Non-healing sores [ ] ; Stroke [ ] ; TIA [ ] ; Slurred speech [ ] ;  Neuro: Headaches[ ] ; Vertigo[ ] ; Seizures[ ] ; Paresthesias[ ] ;Blurred vision [ ] ; Diplopia [ ] ; Vision changes [ ]   Ortho/Skin: Arthritis [ ] ; Joint pain [ ] ; Muscle pain [ ] ; Joint swelling [ ] ; Back Pain [ ] ; Rash [ ]   Psych: Depression[ ] ; Anxiety[ ]   Heme: Bleeding problems [ ] ; Clotting disorders [ ] ; Anemia [ ]   Endocrine: Diabetes [ ] ; Thyroid dysfunction[ ]   Home Medications Prior to Admission medications   Medication Sig Start Date End Date Taking? Authorizing Provider  amiodarone (PACERONE) 200 MG tablet Take 1 tablet (200 mg total) by mouth daily. 02/26/13  Yes Amy D Clegg, NP  aspirin EC 81 MG tablet Take 81 mg  by mouth daily.   Yes Historical Provider, MD  carvedilol (COREG) 6.25 MG tablet Take 1 tablet (6.25 mg total) by mouth 2 (two) times daily with a meal. 02/26/13  Yes Amy D Clegg, NP  hydrALAZINE (APRESOLINE) 25 MG tablet Take 1 tablet (25 mg total) by mouth 3 (three) times daily. 02/26/13  Yes Amy D Clegg, NP  HYDROcodone-acetaminophen (NORCO/VICODIN) 5-325 MG per tablet Take 1-2 tablets by mouth every 6 (six) hours as needed. 01/26/13  Yes Amy D Clegg, NP  isosorbide mononitrate (IMDUR) 30 MG 24 hr tablet Take 30 mg by mouth daily.   Yes Historical Provider, MD  lisinopril (PRINIVIL,ZESTRIL) 5 MG tablet Take 1 tablet (5 mg total) by mouth 2 (two) times daily. 03/06/13  Yes Hadassah Pais, PA-C  potassium chloride SA (K-DUR,KLOR-CON) 20 MEQ tablet Take 20 mEq  by mouth daily.   Yes Historical Provider, MD    Past Medical History: Past Medical History  Diagnosis Date  . Hypertension   . Hyperlipidemia   . Systolic CHF, chronic   . Obesity   . Nonischemic cardiomyopathy     a.  echo 4/06: EF 30%, mild to mod MR, mild RAE, inf HK, lat HK , ant HK;    b.  cath 4/06: no CAD, EF 20-25%  . ED (erectile dysfunction)   . NSVT (nonsustained ventricular tachycardia)     eval by EP in past; no ICD candidate due to NYHA 1 symptoms  . Shortness of breath     Past Surgical History: Past Surgical History  Procedure Laterality Date  . Cardiac catheterization  09/2011  . Multiple extractions with alveoloplasty N/A 01/26/2013    Procedure:  EXTRACION  TOOTH # 19 WITH ALVEOLOPLASTY;  Surgeon: Charlynne Pander, DDS;  Location: [facility] OR;  Service: Oral Surgery;  Laterality: N/A;    Family History: Family History  Problem Relation Age of Onset  . Coronary artery disease Mother 78    s/p PCI  . Lung cancer Father   . Diabetes type II Maternal Uncle   . Coronary artery disease Maternal Uncle     Social History: History   Social History  . Marital Status: Legally Separated    Spouse Name: N/A    Number of Children: 5  . Years of Education: N/A   Occupational History  . unemployed    Social History Main Topics  . Smoking status: Current Every Day Smoker -- 0.30 packs/day for 4 years    Types: Cigarettes  . Smokeless tobacco: Never Used  . Alcohol Use: 3.6 oz/week    6 Cans of beer per week     Comment: 06/22/2012 "maybe 6 pk beer/wk"  . Drug Use: Yes    Special: Cocaine     Comment: 06/22/2012 "last cocaine ~ 6 months ago"  Last use of cocaine in October 2013  . Sexually Active: Yes   Other Topics Concern  . None   Social History Narrative  . None    Allergies:  Allergies  Allergen Reactions  . Penicillins Other (See Comments)    Unknown childhood reaction     Objective:    Vital Signs:   Temp:  [97.6 F (36.4 C)] 97.6 F (36.4  C) (04/29 0914) Pulse Rate:  [78-90] 79 (04/29 1215) Resp:  [13-18] 16 (04/29 1215) BP: (95-122)/(63-77) 97/65 mmHg (04/29 1215) SpO2:  [96 %-100 %] 96 % (04/29 1215)   There were no vitals filed for this visit.  Physical Exam: General:  Well  appearing. No resp difficulty HEENT: normal Neck: supple. JVP to ear . Carotids 2+ bilat; no bruits. No lymphadenopathy or thryomegaly appreciated. Cor: PMI nondisplaced. Regular rate & rhythm. No rubs, gallops or murmurs. Lungs: clear Abdomen: soft, nontender, mildly distended. No hepatosplenomegaly. No bruits or masses. Good bowel sounds. Extremities: no cyanosis, clubbing, rash, 2-3+ edema Neuro: alert & orientedx3, cranial nerves grossly intact. moves all 4 extremities w/o difficulty. Affect pleasant  Telemetry: SR  Labs: Basic Metabolic Panel:  Recent Labs Lab 03/06/13 1541 03/13/13 0943  NA 134* 135  K 3.9 4.0  CL 99 98  CO2 26 29  GLUCOSE 126* 120*  BUN 22 23  CREATININE 1.20 1.46*  CALCIUM 9.1 9.0    CBC:  Recent Labs Lab 03/13/13 0943  WBC 7.0  HGB 12.7*  HCT 36.5*  MCV 80.6  PLT 203     BNP (last 3 results)  Recent Labs  01/24/13 1102 02/20/13 1450 03/13/13 0943  PROBNP 3126.0* 2002.0* 1914.0*     Imaging: Dg Chest 2 View  03/13/2013  *RADIOLOGY REPORT*  Clinical Data: Chest pain.  Bilateral leg pain and swelling.  CHEST - 2 VIEW  Comparison: 02/21/2013  Findings: There is moderate enlargement of the cardiac silhouette, stable.  No mediastinal or hilar masses.  The lungs are clear.  No pleural effusion or pneumothorax.  No pulmonary edema.  The bony thorax and surrounding soft tissues are unremarkable.  IMPRESSION: Moderate cardiomegaly.  Clear lungs with no evidence of pulmonary edema and no pleural effusions.   Original Report Authenticated By: Amie Portland, M.D.          Assessment:   1. A/C systolic heart failure - ECHO 06/1190 EF 15%  2. H/o VT, Lifevest placed 02/26/13 3. Chronic Renal  Failure - creatinine baseline 1.3-1.4  4. 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 Noncompliance   Plan/Discussion:    Robbi Garter, [facility] 03/13/2013, 12:32 PM  Patient seen and examined with Ulyess Blossom, PA-C. We discussed all aspects of the encounter. I agree with the assessment as stated above. My thoughts below.  Mr. Januszewski is about 30 pounds up from his baseline but not overly symptomatic. He has begun to respond to the change from lasix to torsemide at home and is now down 5 pounds over the past few days. We will give him 80mg  IV lasix and 5mg  metolazone now and assess response. If good response (> 1.5 L out) will give a 2nd dose of IV lasix and let him go with a daily dose of metolazone in addition to his torsemide. He will f/u in the clinic later this week. If not responding to IV lasix in the ER he will need to be admitted.   Krystl Wickware,MD 2:22 PM   Re-evaluated patient at 4:45 pm.  UOP -2200.  Discussed with Dr. [name] and ok for discharge.  Will discharge home with metolazone 2.5 mg for 3 days and follow up on Friday at 8:30a.  Patient agrees to plan.    Robbi Garter, PAC 5:10 PM  Agree.  Truman Hayward 5:41 PM

## 2013-03-13 NOTE — ED Provider Notes (Signed)
Patient has been seen by cardiology and given the talus and furosemide with excellent diuresis. They've arranged for discharge with followup in the cardiology office in 2 days. He is to continue taking the talus as an outpatient until adequate diuresis is achieved.  [name], MD 03/13/13 [phone]

## 2013-03-13 NOTE — ED Notes (Signed)
DR Adriana Reams IN TO [facility] PATIENT

## 2013-03-14 NOTE — ED Provider Notes (Signed)
Medical screening examination/treatment/procedure(s) were conducted as a shared visit with non-physician practitioner(s) and myself.  I personally evaluated the patient during the encounter Pt w hx chf, cm presents w leg swelling, bil, and mild sob/doe, wt increase. Followed by [facility]. [facility] called - will see in ed/admit.   Suzi Roots, MD 03/14/13 [phone]

## 2013-03-16 ENCOUNTER — Ambulatory Visit (HOSPITAL_COMMUNITY)
Admit: 2013-03-16 | Discharge: 2013-03-16 | Disposition: A | Discharge status: Home / Self Care | Payer: Medicaid Other | Source: Ambulatory Visit | Attending: Internal Medicine | Admitting: Internal Medicine

## 2013-03-16 ENCOUNTER — Encounter (HOSPITAL_COMMUNITY): Payer: Self-pay

## 2013-03-16 VITALS — BP 114/82 | HR 84 | Wt 282.0 lb

## 2013-03-16 DIAGNOSIS — I5022 Chronic systolic (congestive) heart failure: Secondary | ICD-10-CM | POA: Insufficient documentation

## 2013-03-16 LAB — BASIC METABOLIC PANEL
BUN: 32 mg/dL — ABNORMAL HIGH (ref 6–23)
CO2: 30 mEq/L (ref 19–32)
Chloride: 87 mEq/L — ABNORMAL LOW (ref 96–112)
Creatinine, Ser: 1.34 mg/dL (ref 0.50–1.35)
GFR calc Af Amer: 76 mL/min — ABNORMAL LOW (ref >90)
Glucose, Bld: 106 mg/dL — ABNORMAL HIGH (ref 70–99)
Potassium: 3.2 mEq/L — ABNORMAL LOW (ref 3.5–5.1)

## 2013-03-16 MED ORDER — METOLAZONE 2.5 MG PO TABS: 2.5000 mg | ORAL_TABLET | Freq: Every day | ORAL | Status: DC

## 2013-03-16 MED ORDER — POTASSIUM CHLORIDE CRYS ER 20 MEQ PO TBCR: 20.0000 meq | EXTENDED_RELEASE_TABLET | Freq: Two times a day (BID) | ORAL | Status: DC

## 2013-03-16 NOTE — Progress Notes (Signed)
Patient ID: Douglas [name], male   DOB: 1975-12-15, 37 y.o.   MRN: [id number] History of Present Illness:  Douglas [name] is a 37 y.o. male with a h/o HTN, HL, obesity and CHF due to NICM. Management has been complicated by severe non-compliance.   Underwent cath in 2006 which showed normal cors with EF 20-25%.   EF had recovered in late 2013 but has again fallen to 15% per echo 01/20/13.  August 2012 CPX text VO2 20  He was admitted to [facility] 02/21/13 from clinic for low output symptoms and volume overload.  He required IV lasix and metolazone.  He diuresed 19 pounds with discharge weight of 254 pounds.  He had runs of VT therefore he was started on amiodarone per EP and Lifevest was placed at discharge.  He underwent RHC on 02/22/13.   RHC 02/22/13  RA = 12  RV = 49/6/15  PA = 54/29 (41)  PCW = 28 (v= 40)  Fick cardiac output/index = 4.3/1.7  PVR = 2.6 Woods  SVR = 1580 dynes  FA sat = 98%  PA sat = 53%, 54%  Non-invasive BP = 124/86 (97)  Evaluated in [facility] ED 03/13/13 for volume overload. Weight up 30 pounds to 291 pounds. He was given IV lasix and Metolazone. Discharged Torsemide and Metolazone 2.5 mg for 3 days.   He returns for follow up.  Earlier this week he receive 80 mg IV lasix and 5 mg of Metolazone. Weight at home down to 280 pounds.  He is feeling much better. Denies SOB/PND/Orhtopnea/CP. Great appetite. Compliant with medications but has difficulty . Wears lifevest. Drinking< 2 liters. Eating at high salt foods at [facility].     Past Medical History  Diagnosis Date  . Hypertension   . Hyperlipidemia   . Systolic CHF, chronic   . Obesity   . Nonischemic cardiomyopathy     a.  echo 4/06: EF 30%, mild to mod MR, mild RAE, inf HK, lat HK , ant HK;    b.  cath 4/06: no CAD, EF 20-25%  . ED (erectile dysfunction)   . NSVT (nonsustained ventricular tachycardia)     eval by EP in past; no ICD candidate due to NYHA 1 symptoms  . Shortness of breath     Current Outpatient  Prescriptions  Medication Sig Dispense Refill  . amiodarone (PACERONE) 200 MG tablet Take 1 tablet (200 mg total) by mouth daily.  30 tablet  3  . aspirin EC 81 MG tablet Take 81 mg by mouth daily.      . carvedilol (COREG) 6.25 MG tablet Take 1 tablet (6.25 mg total) by mouth 2 (two) times daily with a meal.  60 tablet  3  . hydrALAZINE (APRESOLINE) 25 MG tablet Take 1 tablet (25 mg total) by mouth 3 (three) times daily.  90 tablet  3  . HYDROcodone-acetaminophen (NORCO/VICODIN) 5-325 MG per tablet Take 1-2 tablets by mouth every 6 (six) hours as needed.  20 tablet  0  . isosorbide mononitrate (IMDUR) 30 MG 24 hr tablet Take 30 mg by mouth daily.      Marland Kitchen lisinopril (PRINIVIL,ZESTRIL) 5 MG tablet Take 1 tablet (5 mg total) by mouth 2 (two) times daily.      . potassium chloride SA (K-DUR,KLOR-CON) 20 MEQ tablet Take 20 mEq by mouth daily.       No current facility-administered medications for this encounter.    Allergies  Allergen Reactions  . Penicillins Other (See  Comments)    Unknown childhood reaction     ROS:  Please see the history of present illness.  He denies fevers, chills, melena, hematochezia.  All other systems reviewed and negative.  Vital Signs: Filed Vitals:   03/16/13 0838  BP: 114/82  Pulse: 84  Weight: 282 lb (127.914 kg)  SpO2: 97%   Weight 291>282 pounds PHYSICAL EXAM: Well nourished, well developed, in no acute distress HEENT: normal Neck: JVP 6-7. Carotids 2+ bilaterally.No Bruits  Cardiac:  PMI laterally displaced. Tachycardic with regular rhythm, +S3 Life Vest in place Lungs:  clear to auscultation bilaterally, no wheezing, rhonchi or rales Ab: obese. Soft NT.  Mild distention.  No bruits.. Ext warm. no cyanosis or clubbing, 1+ bilateral lower extremity edema Skin: warm and dry Psych:normal affect. CNII-XII grossly intact

## 2013-03-16 NOTE — Assessment & Plan Note (Addendum)
NYHA III. Volume status mildly elevated which is likely due to noncompliance with diet. Continue current diuretic regimen and he is instructed to take Metolazone 2.5 mg every Sunday. Will not increase carvedilol today due to elevated volume status. Reviewed recent BMET and will increase potassium to 20 meq bid. Check BMET today. Provided potassium, metolazone, and hydralazine medications from [facility] indigent HF fund. Follow up next week to reassess volume status.

## 2013-03-16 NOTE — Patient Instructions (Addendum)
Take Metolazone 2.5 mg every Sunday  Take KDUR 20 meq twice a day  Do the following things EVERYDAY: 1) Weigh yourself in the morning before breakfast. Write it down and keep it in a log. 2) Take your medicines as prescribed 3) Eat low salt foods-Limit salt (sodium) to 2000 mg per day.  4) Stay as active as you can everyday 5) Limit all fluids for the day to less than 2 liters  Follow up in 1 week

## 2013-03-23 ENCOUNTER — Ambulatory Visit (HOSPITAL_COMMUNITY)
Admission: RE | Admit: 2013-03-23 | Discharge: 2013-03-23 | Disposition: A | Discharge status: Home / Self Care | Payer: Medicaid Other | Source: Ambulatory Visit | Attending: Internal Medicine | Admitting: Internal Medicine

## 2013-03-23 VITALS — BP 98/72 | HR 89 | Wt 285.0 lb

## 2013-03-23 DIAGNOSIS — I428 Other cardiomyopathies: Secondary | ICD-10-CM | POA: Insufficient documentation

## 2013-03-23 DIAGNOSIS — E785 Hyperlipidemia, unspecified: Secondary | ICD-10-CM | POA: Insufficient documentation

## 2013-03-23 DIAGNOSIS — E669 Obesity, unspecified: Secondary | ICD-10-CM | POA: Insufficient documentation

## 2013-03-23 DIAGNOSIS — I509 Heart failure, unspecified: Secondary | ICD-10-CM | POA: Insufficient documentation

## 2013-03-23 DIAGNOSIS — Z91199 Patient's noncompliance with other medical treatment and regimen due to unspecified reason: Secondary | ICD-10-CM | POA: Insufficient documentation

## 2013-03-23 DIAGNOSIS — I5022 Chronic systolic (congestive) heart failure: Secondary | ICD-10-CM

## 2013-03-23 DIAGNOSIS — Z9119 Patient's noncompliance with other medical treatment and regimen: Secondary | ICD-10-CM | POA: Insufficient documentation

## 2013-03-23 DIAGNOSIS — Z79899 Other long term (current) drug therapy: Secondary | ICD-10-CM | POA: Insufficient documentation

## 2013-03-23 DIAGNOSIS — Z7982 Long term (current) use of aspirin: Secondary | ICD-10-CM | POA: Insufficient documentation

## 2013-03-23 DIAGNOSIS — I10 Essential (primary) hypertension: Secondary | ICD-10-CM

## 2013-03-23 NOTE — Assessment & Plan Note (Signed)
His blood pressure is on the softer side today but he is asymptomatic.  Will continue with diuresis but hold further titration of HF meds at this time.

## 2013-03-23 NOTE — Progress Notes (Addendum)
History of Present Illness:  Douglas [name] is a 37 y.o. male with a h/o HTN, HL, obesity and CHF due to NICM. Management has been complicated by severe non-compliance.   Underwent cath in 2006 which showed normal cors with EF 20-25%.   EF had recovered in late 2013 but has again fallen to 15% per echo 01/20/13.  August 2012 CPX text VO2 20  He was admitted to [facility] 02/21/13 from clinic for low output symptoms and volume overload.  He required IV lasix and metolazone.  He diuresed 19 pounds with discharge weight of 254 pounds.  He had runs of VT therefore he was started on amiodarone per EP and Lifevest was placed at discharge.  He underwent RHC on 02/22/13.   RHC 02/22/13  RA = 12  RV = 49/6/15  PA = 54/29 (41)  PCW = 28 (v= 40)  Fick cardiac output/index = 4.3/1.7  PVR = 2.6 Woods  SVR = 1580 dynes  FA sat = 98%  PA sat = 53%, 54%  Non-invasive BP = 124/86 (97)  Evaluated in [facility] ED 03/13/13 for volume overload. Weight up 30 pounds to 291 pounds. He was given IV lasix and Metolazone. Discharged Torsemide and Metolazone 2.5 mg for 3 days.   He returns for 1 week  follow up today.  Metolazone increased to once weekly.   Says he took metolazone Sunday and Tuesday because he felt his fluid was coming back.  His weight at home is ranging 278-282 pounds.  Still having problems with dyspnea and orthopnea.  Denies PND.  He is watching fluid intake closely.  Says he has a good appetite but did have pickles and spaghetti this week.  Continues to wear LifeVest, no alarms.    Past Medical History  Diagnosis Date  . Hypertension   . Hyperlipidemia   . Systolic CHF, chronic   . Obesity   . Nonischemic cardiomyopathy     a.  echo 4/06: EF 30%, mild to mod MR, mild RAE, inf HK, lat HK , ant HK;    b.  cath 4/06: no CAD, EF 20-25%  . ED (erectile dysfunction)   . NSVT (nonsustained ventricular tachycardia)     eval by EP in past; no ICD candidate due to NYHA 1 symptoms  . Shortness of breath      Current Outpatient Prescriptions  Medication Sig Dispense Refill  . amiodarone (PACERONE) 200 MG tablet Take 1 tablet (200 mg total) by mouth daily.  30 tablet  3  . aspirin EC 81 MG tablet Take 81 mg by mouth daily.      . carvedilol (COREG) 6.25 MG tablet Take 1 tablet (6.25 mg total) by mouth 2 (two) times daily with a meal.  60 tablet  3  . hydrALAZINE (APRESOLINE) 25 MG tablet Take 1 tablet (25 mg total) by mouth 3 (three) times daily.  90 tablet  3  . HYDROcodone-acetaminophen (NORCO/VICODIN) 5-325 MG per tablet Take 1-2 tablets by mouth every 6 (six) hours as needed.  20 tablet  0  . isosorbide mononitrate (IMDUR) 30 MG 24 hr tablet Take 30 mg by mouth daily.      Marland Kitchen lisinopril (PRINIVIL,ZESTRIL) 5 MG tablet Take 1 tablet (5 mg total) by mouth 2 (two) times daily.      . metolazone (ZAROXOLYN) 2.5 MG tablet Take 1 tablet (2.5 mg total) by mouth daily. Take 2.5 mg every Sunday and as needed  12 tablet  3  . potassium chloride  SA (K-DUR,KLOR-CON) 20 MEQ tablet Take 1 tablet (20 mEq total) by mouth 2 (two) times daily.  60 tablet  3  . torsemide (DEMADEX) 20 MG tablet Take 40 mg by mouth 2 (two) times daily.       No current facility-administered medications for this encounter.    Allergies  Allergen Reactions  . Penicillins Other (See Comments)    Unknown childhood reaction     ROS:  Please see the history of present illness.  He denies fevers, chills, melena, hematochezia.  All other systems reviewed and negative.  Vital Signs: Filed Vitals:   03/23/13 0847  BP: 98/72  Pulse: 89  Weight: 285 lb (129.275 kg)  SpO2: 99%     PHYSICAL EXAM: Well nourished, well developed, in no acute distress HEENT: normal Neck: JVP 8-9. Carotids 2+ bilaterally.No Bruits  Cardiac:  PMI laterally displaced. Tachycardic with regular rhythm, +S3 Life Vest in place Lungs:  clear to auscultation bilaterally, no wheezing, rhonchi or rales Ab: obese. Soft NT.  Mild distention.  No  bruits.. Ext warm. no cyanosis or clubbing, 1+ bilateral lower extremity edema Skin: warm and dry Psych:normal affect. CNII-XII grossly intact

## 2013-03-23 NOTE — Patient Instructions (Addendum)
Increase torsemide 60 mg (3 tabs) twice daily.  Take metolazone today and then continue every Sunday.  Take extra potassium on metolazone days.  Follow up 1 week with labs.

## 2013-03-23 NOTE — Assessment & Plan Note (Addendum)
Volume status remains elevated and continues to fluctuate within 5 pounds.  He remains 30 pounds above discharge weight.  Will increase torsemide 60 mg BID.  Also give metolazone today and Sunday with extra potassium.  Have re-educated on low sodium diet and fluid restrictions.  If fluid continues to be an issue can discuss repeat catheterization to determine if there is need for home inotrope use.  Continue carvedilol, hydralazine/imdure, and lisinopril.  Follow up next week with repeat BMET.  Continue LifeVest use, follow up echo in 1 month for possible ICD implantation for prevention of SCD if EF remains <35%.

## 2013-03-26 ENCOUNTER — Telehealth (HOSPITAL_COMMUNITY): Payer: Self-pay | Admitting: Anesthesiology

## 2013-03-26 NOTE — Telephone Encounter (Signed)
Patient called outpatient pharmacy did not have refills on lisinopril and torsemide. Refaxed scripts to pharmacy. Told patient he would have to get amiodarone at [facility]. Call any questions.

## 2013-03-27 ENCOUNTER — Other Ambulatory Visit: Payer: Self-pay

## 2013-03-27 MED ORDER — TORSEMIDE 20 MG PO TABS: 40.0000 mg | ORAL_TABLET | Freq: Two times a day (BID) | ORAL | Status: DC

## 2013-03-27 NOTE — Telephone Encounter (Signed)
..   Requested Prescriptions   Signed Prescriptions Disp Refills  . torsemide (DEMADEX) 20 MG tablet 120 tablet 3    Sig: Take 2 tablets (40 mg total) by mouth 2 (two) times daily.    Authorizing Provider: Rollene Rotunda    Ordering User: Christella Hartigan, Momin Misko Judie Petit

## 2013-03-29 ENCOUNTER — Encounter (HOSPITAL_COMMUNITY): Payer: Self-pay

## 2013-03-30 ENCOUNTER — Ambulatory Visit (HOSPITAL_COMMUNITY)
Admission: RE | Admit: 2013-03-30 | Discharge: 2013-03-30 | Disposition: A | Discharge status: Home / Self Care | Payer: Medicaid Other | Source: Ambulatory Visit | Attending: Internal Medicine | Admitting: Internal Medicine

## 2013-03-30 VITALS — BP 112/89 | HR 91 | Wt 298.0 lb

## 2013-03-30 DIAGNOSIS — E669 Obesity, unspecified: Secondary | ICD-10-CM | POA: Insufficient documentation

## 2013-03-30 DIAGNOSIS — I5022 Chronic systolic (congestive) heart failure: Secondary | ICD-10-CM

## 2013-03-30 DIAGNOSIS — I10 Essential (primary) hypertension: Secondary | ICD-10-CM | POA: Insufficient documentation

## 2013-03-30 DIAGNOSIS — Z91199 Patient's noncompliance with other medical treatment and regimen due to unspecified reason: Secondary | ICD-10-CM | POA: Insufficient documentation

## 2013-03-30 DIAGNOSIS — E785 Hyperlipidemia, unspecified: Secondary | ICD-10-CM | POA: Insufficient documentation

## 2013-03-30 DIAGNOSIS — I509 Heart failure, unspecified: Secondary | ICD-10-CM | POA: Insufficient documentation

## 2013-03-30 DIAGNOSIS — Z9119 Patient's noncompliance with other medical treatment and regimen: Secondary | ICD-10-CM | POA: Insufficient documentation

## 2013-03-30 NOTE — Assessment & Plan Note (Addendum)
NYHA II. Volume status mildly elevated despite weight gain which is likely due reduced diuretics. (He was out of Torsemide for the last 3-4 days and he started taking lasix 80 mg twice a day).Will not titrate HF medications due to elevated volume. I have reordered Torsemide 60 mg twice a day from [facility] pharmacy. He verbalized that he will pick up Torsemide today. Continue Metolazone on Sundays. Reinforced daily weights, limiting fluid intake, and low salt food choices. Continue life vest until HF meds optimized and repeat ECHO. Follow up next week to check BMET and volume status. May need IV lasix in clinic if volume status trends up.

## 2013-03-30 NOTE — Progress Notes (Signed)
Patient ID: Douglas [name], male   DOB: [birth year], 37 y.o.   MRN: [id number] History of Present Illness:  Douglas [name] is a 37 y.o. male with a h/o HTN, HL, obesity and CHF due to NICM. Management has been complicated by severe non-compliance.   Underwent cath in 2006 which showed normal cors with EF 20-25%.   EF had recovered in late 2013 but has again fallen to 15% per echo 01/20/13.  August 2012 CPX text VO2 20  He was admitted to [facility] 02/21/13 from clinic for low output symptoms and volume overload.  He required IV lasix and metolazone.  He diuresed 19 pounds with discharge weight of 254 pounds.  He had runs of VT therefore he was started on amiodarone per EP and Lifevest was placed at discharge.  He underwent RHC on 02/22/13.   RHC 02/22/13  RA = 12  RV = 49/6/15  PA = 54/29 (41)  PCW = 28 (v= 40)  Fick cardiac output/index = 4.3/1.7  PVR = 2.6 Woods  SVR = 1580 dynes  FA sat = 98%  PA sat = 53%, 54%  Non-invasive BP = 124/86 (97)  Evaluated in [facility] ED 03/13/13 for volume overload. Weight up 30 pounds to 291 pounds. He was given IV lasix and Metolazone. Discharged Torsemide and Metolazone 2.5 mg for 3 days.   He returns for follow up today.  Last visit Torsemide increased to 60 mg twice a day and he continued on Metolazone every Sunday.Over all he says he feels much better.  He ran out of Torsemide and was unable to get refilled so he took 80 mg of lasix twice a day for the last 2-3 days. Weight home up to 286 pounds. Denies SOB/PND/Orthopnea. Able to sleep in the bed. Able to walk up steps without dyspnea. . Eating high salt diet such as pizza. He continues to wear life vest.   Past Medical History  Diagnosis Date  . Hypertension   . Hyperlipidemia   . Systolic CHF, chronic   . Obesity   . Nonischemic cardiomyopathy     a.  echo 4/06: EF 30%, mild to mod MR, mild RAE, inf HK, lat HK , ant HK;    b.  cath 4/06: no CAD, EF 20-25%  . ED (erectile dysfunction)   . NSVT  (nonsustained ventricular tachycardia)     eval by EP in past; no ICD candidate due to NYHA 1 symptoms  . Shortness of breath     Current Outpatient Prescriptions  Medication Sig Dispense Refill  . aspirin EC 81 MG tablet Take 81 mg by mouth daily.      . carvedilol (COREG) 6.25 MG tablet Take 1 tablet (6.25 mg total) by mouth 2 (two) times daily with a meal.  60 tablet  3  . hydrALAZINE (APRESOLINE) 25 MG tablet Take 1 tablet (25 mg total) by mouth 3 (three) times daily.  90 tablet  3  . isosorbide mononitrate (IMDUR) 30 MG 24 hr tablet Take 30 mg by mouth daily.      Marland Kitchen lisinopril (PRINIVIL,ZESTRIL) 5 MG tablet Take 1 tablet (5 mg total) by mouth 2 (two) times daily.      . metolazone (ZAROXOLYN) 2.5 MG tablet Take 1 tablet (2.5 mg total) by mouth daily. Take 2.5 mg every Sunday and as needed  12 tablet  3  . potassium chloride SA (K-DUR,KLOR-CON) 20 MEQ tablet Take 1 tablet (20 mEq total) by mouth 2 (two) times daily.  60 tablet  3  . amiodarone (PACERONE) 200 MG tablet Take 1 tablet (200 mg total) by mouth daily.  30 tablet  3  . torsemide (DEMADEX) 20 MG tablet Take 2 tablets (40 mg total) by mouth 2 (two) times daily.  120 tablet  3   No current facility-administered medications for this encounter.    Allergies  Allergen Reactions  . Penicillins Other (See Comments)    Unknown childhood reaction     ROS:  Please see the history of present illness.  He denies fevers, chills, melena, hematochezia.  All other systems reviewed and negative.  Vital Signs: Filed Vitals:   03/30/13 0922  BP: 112/89  Pulse: 91  Weight: 298 lb (135.172 kg)  SpO2: 98%     PHYSICAL EXAM: Well nourished, well developed, in no acute distressSister present HEENT: normal Neck: JVP 8-9. Carotids 2+ bilaterally.No Bruits  Cardiac:  PMI laterally displaced. Tachycardic with regular rhythm, +S3 Life Vest in place Lungs:  clear to auscultation bilaterally, no wheezing, rhonchi or rales Ab: obese. Soft  NT.  Mild distention.  No bruits.. Ext warm. no cyanosis or clubbing, no lower extremity edema Skin: warm and dry Psych:normal affect. CNII-XII grossly intact

## 2013-03-30 NOTE — Patient Instructions (Addendum)
Follow up next week  Take Torsemide 60 mg twice a day  Do the following things EVERYDAY: 1) Weigh yourself in the morning before breakfast. Write it down and keep it in a log. 2) Take your medicines as prescribed 3) Eat low salt foods-Limit salt (sodium) to 2000 mg per day.  4) Stay as active as you can everyday 5) Limit all fluids for the day to less than 2 liters 6)

## 2013-03-31 ENCOUNTER — Other Ambulatory Visit: Payer: Self-pay | Admitting: Physician Assistant

## 2013-03-31 ENCOUNTER — Telehealth: Payer: Self-pay | Admitting: Physician Assistant

## 2013-03-31 MED ORDER — AMIODARONE HCL 200 MG PO TABS: 200.0000 mg | ORAL_TABLET | Freq: Every day | ORAL | Status: DC

## 2013-03-31 NOTE — Telephone Encounter (Signed)
Mr. Redmon called on Sat 5/17 morning regarding a medication question. He went to pick up his amiodarone yesterday and was told it is still pending Medicaid approval, meaning he would have to self-pay. The information was reportedly faxed yesterday, but he thinks it was faxed to the doctor listed on his medicaid card rather than Dr. [name]. The office is closed so I do not have access to our faxes. The patient does not want to pay the $19 for the total prescription. At first he wanted to go without the medicine until Monday but I told him this is a bad idea and he should make sure to continue taking it. I offered to call him in a short-term rx for a few tablets until he can get the Medicaid approval. I offered him a few different options, but he wanted the least number of pills to get him through until Monday.  I e-scribed amiodarone 200mg  daily disp 3 with 0 rf to his [facility]. He was instructed to contact the physician's office whom the fax was likely sent to, but also was told this may be difficult to resolve over the weekend as their on-call provider may not have the necessary paperwork either. He will call Dr. Prescott Gum office on Monday AM if he has any further difficulty with his medicaid approval. He verbalized understanding and gratitude.

## 2013-04-06 ENCOUNTER — Ambulatory Visit (HOSPITAL_COMMUNITY)
Admission: RE | Admit: 2013-04-06 | Discharge: 2013-04-06 | Disposition: A | Discharge status: Home / Self Care | Payer: Medicaid Other | Source: Ambulatory Visit | Attending: Internal Medicine | Admitting: Internal Medicine

## 2013-04-06 ENCOUNTER — Encounter (HOSPITAL_COMMUNITY): Payer: Self-pay

## 2013-04-06 VITALS — BP 98/78 | HR 89 | Wt 288.4 lb

## 2013-04-06 DIAGNOSIS — Z6835 Body mass index (BMI) 35.0-35.9, adult: Secondary | ICD-10-CM | POA: Insufficient documentation

## 2013-04-06 DIAGNOSIS — E785 Hyperlipidemia, unspecified: Secondary | ICD-10-CM | POA: Insufficient documentation

## 2013-04-06 DIAGNOSIS — I428 Other cardiomyopathies: Secondary | ICD-10-CM | POA: Insufficient documentation

## 2013-04-06 DIAGNOSIS — I10 Essential (primary) hypertension: Secondary | ICD-10-CM | POA: Insufficient documentation

## 2013-04-06 DIAGNOSIS — Z7982 Long term (current) use of aspirin: Secondary | ICD-10-CM | POA: Insufficient documentation

## 2013-04-06 DIAGNOSIS — E669 Obesity, unspecified: Secondary | ICD-10-CM | POA: Insufficient documentation

## 2013-04-06 DIAGNOSIS — I509 Heart failure, unspecified: Secondary | ICD-10-CM | POA: Insufficient documentation

## 2013-04-06 DIAGNOSIS — I5022 Chronic systolic (congestive) heart failure: Secondary | ICD-10-CM

## 2013-04-06 LAB — BASIC METABOLIC PANEL
CO2: 24 mEq/L (ref 19–32)
Calcium: 9.8 mg/dL (ref 8.4–10.5)
Chloride: 92 mEq/L — ABNORMAL LOW (ref 96–112)
Creatinine, Ser: 1.04 mg/dL (ref 0.50–1.35)
Glucose, Bld: 122 mg/dL — ABNORMAL HIGH (ref 70–99)

## 2013-04-06 MED ORDER — METOLAZONE 2.5 MG PO TABS: 2.5000 mg | ORAL_TABLET | Freq: Every day | ORAL | Status: DC

## 2013-04-06 NOTE — Assessment & Plan Note (Addendum)
NYHA II-III.  Volume status remains elevated but staying stable.  Will continue diuresis with torsemide 60 mg BID and increase metolazone to twice weekly.  Have encouraged him to weigh daily and call if weight increase.  Will continue Lifevest with follow up echo next month.  Follow up 2 weeks.   Patient seen and examined with Ulyess Blossom, PA-C. We discussed all aspects of the encounter. I agree with the assessment and plan as stated above.  Weight is up but volume status doesn't look to bad. Symptomatically improved. Will increase metolazone to 2x/week to keep volume down. Reinforced need for daily weights and reviewed use of sliding scale diuretics. Continue LifeVest.

## 2013-04-06 NOTE — Progress Notes (Signed)
History of Present Illness:  Douglas [name] is a 37 y.o. male with a h/o HTN, HL, obesity and CHF due to NICM. Management has been complicated by severe non-compliance.   Underwent cath in 2006 which showed normal cors with EF 20-25%.   EF had recovered in late 2013 but has again fallen to 15% per echo 01/20/13.  August 2012 CPX text VO2 20  He was admitted to [facility] 02/21/13 from clinic for low output symptoms and volume overload.  He required IV lasix and metolazone.  He diuresed 19 pounds with discharge weight of 254 pounds.  He had runs of VT therefore he was started on amiodarone per EP and Lifevest was placed at discharge.  He underwent RHC on 02/22/13.   RHC 02/22/13  RA = 12  RV = 49/6/15  PA = 54/29 (41)  PCW = 28 (v= 40)  Fick cardiac output/index = 4.3/1.7  PVR = 2.6 Woods  SVR = 1580 dynes  FA sat = 98%  PA sat = 53%, 54%  Non-invasive BP = 124/86 (97)  Evaluated in [facility] ED 03/13/13 for volume overload. Weight up 30 pounds to 291 pounds. He was given IV lasix and Metolazone. Discharged Torsemide and Metolazone 2.5 mg for 3 days.   He returns for 1 week follow up today.  He He has been continued on 60 mg BID and metolazone once weekly.  He says his weight last week was actually 286 pounds. Weight 282-284. He has taken an extra metolazone this week.  He denies orthopnea/PND but continues to sleep on the couch.  He says he is able to lay flat though.  Wearing lifevest and no alarms.      Past Medical History  Diagnosis Date  . Hypertension   . Hyperlipidemia   . Systolic CHF, chronic   . Obesity   . Nonischemic cardiomyopathy     a.  echo 4/06: EF 30%, mild to mod MR, mild RAE, inf HK, lat HK , ant HK;    b.  cath 4/06: no CAD, EF 20-25%  . ED (erectile dysfunction)   . NSVT (nonsustained ventricular tachycardia)     eval by EP in past; no ICD candidate due to NYHA 1 symptoms  . Shortness of breath     Current Outpatient Prescriptions  Medication Sig Dispense Refill  .  amiodarone (PACERONE) 200 MG tablet Take 1 tablet (200 mg total) by mouth daily.  3 tablet  0  . aspirin EC 81 MG tablet Take 81 mg by mouth daily.      . carvedilol (COREG) 6.25 MG tablet Take 1 tablet (6.25 mg total) by mouth 2 (two) times daily with a meal.  60 tablet  3  . hydrALAZINE (APRESOLINE) 25 MG tablet Take 1 tablet (25 mg total) by mouth 3 (three) times daily.  90 tablet  3  . isosorbide mononitrate (IMDUR) 30 MG 24 hr tablet Take 30 mg by mouth daily.      Marland Kitchen lisinopril (PRINIVIL,ZESTRIL) 5 MG tablet Take 1 tablet (5 mg total) by mouth 2 (two) times daily.      . metolazone (ZAROXOLYN) 2.5 MG tablet Take 1 tablet (2.5 mg total) by mouth daily. Take 2.5 mg every Sunday and as needed  12 tablet  3  . potassium chloride SA (K-DUR,KLOR-CON) 20 MEQ tablet Take 1 tablet (20 mEq total) by mouth 2 (two) times daily.  60 tablet  3  . torsemide (DEMADEX) 20 MG tablet Take 2 tablets (40  mg total) by mouth 2 (two) times daily.  120 tablet  3   No current facility-administered medications for this encounter.    Allergies  Allergen Reactions  . Penicillins Other (See Comments)    Unknown childhood reaction     ROS:  Please see the history of present illness.  He denies fevers, chills, melena, hematochezia.  All other systems reviewed and negative.  Vital Signs: Filed Vitals:   04/06/13 1106  BP: 98/78  Pulse: 89  Weight: 288 lb 6.4 oz (130.817 kg)  SpO2: 99%     PHYSICAL EXAM: Well nourished, well developed, in no acute distressSister present HEENT: normal Neck: JVP 8-9. Carotids 2+ bilaterally.No Bruits  Cardiac:  PMI laterally displaced. Tachycardic with regular rhythm, +S3 Life Vest in place Lungs:  clear to auscultation bilaterally, no wheezing, rhonchi or rales Ab: obese. Soft NT.  Mild distention.  No bruits.. Ext warm. no cyanosis or clubbing, 1+ lower extremity edema Skin: warm and dry Psych:normal affect. CNII-XII grossly intact

## 2013-04-06 NOTE — Patient Instructions (Addendum)
Increase metolazone 2.5 mg twice weekly (Wednesday and Sunday)   Labs today.  Follow up 2 weeks.

## 2013-04-10 ENCOUNTER — Other Ambulatory Visit (HOSPITAL_COMMUNITY): Payer: Self-pay | Admitting: *Deleted

## 2013-04-10 ENCOUNTER — Other Ambulatory Visit (HOSPITAL_COMMUNITY): Payer: Self-pay | Admitting: Adult Health

## 2013-04-10 MED ORDER — METOLAZONE 2.5 MG PO TABS: 2.5000 mg | ORAL_TABLET | Freq: Every day | ORAL | Status: DC

## 2013-04-10 MED ORDER — METOLAZONE 2.5 MG PO TABS: ORAL_TABLET | ORAL | Status: DC

## 2013-04-20 ENCOUNTER — Encounter (HOSPITAL_COMMUNITY): Payer: Self-pay

## 2013-04-20 ENCOUNTER — Ambulatory Visit (HOSPITAL_COMMUNITY)
Admission: RE | Admit: 2013-04-20 | Discharge: 2013-04-20 | Disposition: A | Discharge status: Home / Self Care | Payer: Medicaid Other | Source: Ambulatory Visit | Attending: Internal Medicine | Admitting: Internal Medicine

## 2013-04-20 VITALS — BP 108/62 | HR 84 | Wt 296.8 lb

## 2013-04-20 DIAGNOSIS — I5022 Chronic systolic (congestive) heart failure: Secondary | ICD-10-CM

## 2013-04-20 DIAGNOSIS — I509 Heart failure, unspecified: Secondary | ICD-10-CM | POA: Insufficient documentation

## 2013-04-20 DIAGNOSIS — E669 Obesity, unspecified: Secondary | ICD-10-CM | POA: Insufficient documentation

## 2013-04-20 DIAGNOSIS — I10 Essential (primary) hypertension: Secondary | ICD-10-CM | POA: Insufficient documentation

## 2013-04-20 MED ORDER — TORSEMIDE 20 MG PO TABS: 80.0000 mg | ORAL_TABLET | Freq: Two times a day (BID) | ORAL | Status: DC

## 2013-04-20 NOTE — Progress Notes (Signed)
Patient ID: Douglas [name], male   DOB: Jul 20, 1976, 37 y.o.   MRN: [id number] History of Present Illness:  Douglas [name] is a 37 y.o. male with a h/o HTN, HL, obesity and CHF due to NICM. Management has been complicated by severe non-compliance.   Underwent cath in 2006 which showed normal cors with EF 20-25%.   EF had recovered in late 2013 but has again fallen to 15% per echo 01/20/13.  August 2012 CPX text VO2 20  He was admitted to [facility] 02/21/13 from clinic for low output symptoms and volume overload.  He required IV lasix and metolazone.  He diuresed 19 pounds with discharge weight of 254 pounds.  He had runs of VT therefore he was started on amiodarone per EP and Lifevest was placed at discharge.  He underwent RHC on 02/22/13.   RHC 02/22/13  RA = 12  RV = 49/6/15  PA = 54/29 (41)  PCW = 28 (v= 40)  Fick cardiac output/index = 4.3/1.7  PVR = 2.6 Woods  SVR = 1580 dynes  FA sat = 98%  PA sat = 53%, 54%  Non-invasive BP = 124/86 (97)  Evaluated in [facility] ED 03/13/13 for volume overload. Weight up 30 pounds to 291 pounds. He was given IV lasix and Metolazone. Discharged Torsemide and Metolazone 2.5 mg for 3 days.   He returns for follow up today.  Overall he feels ok. Denies SOB/PND/Orthopnea. Weight at home trending up from 288 to 300 pounds. Taking metolazone every Wed and Sun. Sleeps on couch. Wearing lifevest and no alarms.  Taking all medications. Drinking < 2 liters. No lower extremity edema.     Past Medical History  Diagnosis Date  . Hypertension   . Hyperlipidemia   . Systolic CHF, chronic   . Obesity   . Nonischemic cardiomyopathy     a.  echo 4/06: EF 30%, mild to mod MR, mild RAE, inf HK, lat HK , ant HK;    b.  cath 4/06: no CAD, EF 20-25%  . ED (erectile dysfunction)   . NSVT (nonsustained ventricular tachycardia)     eval by EP in past; no ICD candidate due to NYHA 1 symptoms  . Shortness of breath     Current Outpatient Prescriptions  Medication Sig Dispense  Refill  . amiodarone (PACERONE) 200 MG tablet Take 1 tablet (200 mg total) by mouth daily.  3 tablet  0  . aspirin EC 81 MG tablet Take 81 mg by mouth daily.      . carvedilol (COREG) 6.25 MG tablet Take 1 tablet (6.25 mg total) by mouth 2 (two) times daily with a meal.  60 tablet  3  . hydrALAZINE (APRESOLINE) 25 MG tablet Take 1 tablet (25 mg total) by mouth 3 (three) times daily.  90 tablet  3  . isosorbide mononitrate (IMDUR) 30 MG 24 hr tablet Take 30 mg by mouth daily.      Marland Kitchen lisinopril (PRINIVIL,ZESTRIL) 5 MG tablet Take 1 tablet (5 mg total) by mouth 2 (two) times daily.      . metolazone (ZAROXOLYN) 2.5 MG tablet Take 2.5 mg every Monday and Friday and as needed  10 tablet  3  . potassium chloride SA (K-DUR,KLOR-CON) 20 MEQ tablet Take 1 tablet (20 mEq total) by mouth 2 (two) times daily.  60 tablet  3  . torsemide (DEMADEX) 20 MG tablet Take 2 tablets (40 mg total) by mouth 2 (two) times daily.  120 tablet  3  No current facility-administered medications for this encounter.    Allergies  Allergen Reactions  . Penicillins Other (See Comments)    Unknown childhood reaction     ROS:  Please see the history of present illness.  He denies fevers, chills, melena, hematochezia.  All other systems reviewed and negative.  Vital Signs: Filed Vitals:   04/20/13 1051  BP: 108/62  Pulse: 84  Weight: 296 lb 12.8 oz (134.628 kg)  SpO2: 99%     PHYSICAL EXAM: Well nourished, well developed, in no acute distressSister present HEENT: normal Neck: JVP to jaw. Carotids 2+ bilaterally.No Bruits  Cardiac:  PMI laterally displaced. Tachycardic with regular rhythm, +S3 Life Vest in place Lungs:  clear to auscultation bilaterally, no wheezing, rhonchi or rales Ab: obese. Soft NT.  Mild distention.  No bruits. Ext warm. no cyanosis or clubbing, lower extremity edema Skin: warm and dry Psych:normal affect. CNII-XII grossly intact

## 2013-04-20 NOTE — Assessment & Plan Note (Addendum)
NYHA IIIB. Volume status trending up again. Increase Torsemide to 80 mg bid and give extra 2.5 mg of Metolazone today. Continue Metolazone very Wednesday and Sunday. Will not up titrate beta blocker due to increased volume status. Reinforced daily weights, low salt food choices, and daily weights. Continue lifevest until repeat ECHO. If EF remains < 35% will refer for ICD. Follow up next week to reassess volume status and check BMET.

## 2013-04-20 NOTE — Patient Instructions (Addendum)
Follow up next week  Take 2.5 of Metolazone today  Take Torsemide 80 mg twice a day  Do the following things EVERYDAY: 1) Weigh yourself in the morning before breakfast. Write it down and keep it in a log. 2) Take your medicines as prescribed 3) Eat low salt foods-Limit salt (sodium) to 2000 mg per day.  4) Stay as active as you can everyday 5) Limit all fluids for the day to less than 2 liters

## 2013-04-27 ENCOUNTER — Encounter (HOSPITAL_COMMUNITY): Payer: Self-pay | Admitting: *Deleted

## 2013-04-27 ENCOUNTER — Ambulatory Visit (HOSPITAL_COMMUNITY)
Admission: RE | Admit: 2013-04-27 | Discharge: 2013-04-27 | Disposition: A | Discharge status: Home / Self Care | Payer: Medicaid Other | Source: Ambulatory Visit | Attending: Internal Medicine | Admitting: Internal Medicine

## 2013-04-27 ENCOUNTER — Encounter (HOSPITAL_COMMUNITY): Payer: Self-pay

## 2013-04-27 ENCOUNTER — Other Ambulatory Visit (HOSPITAL_COMMUNITY): Payer: Self-pay | Admitting: Adult Health

## 2013-04-27 VITALS — BP 100/70 | HR 80 | Wt 298.0 lb

## 2013-04-27 DIAGNOSIS — I5022 Chronic systolic (congestive) heart failure: Secondary | ICD-10-CM | POA: Insufficient documentation

## 2013-04-27 LAB — BASIC METABOLIC PANEL
BUN: 36 mg/dL — ABNORMAL HIGH (ref 6–23)
CO2: 28 mEq/L (ref 19–32)
Calcium: 9.8 mg/dL (ref 8.4–10.5)
Glucose, Bld: 139 mg/dL — ABNORMAL HIGH (ref 70–99)
Sodium: 131 mEq/L — ABNORMAL LOW (ref 135–145)

## 2013-04-27 LAB — CBC
Hemoglobin: 15.6 g/dL (ref 13.0–17.0)
MCH: 28.3 pg (ref 26.0–34.0)
MCHC: 36.4 g/dL — ABNORMAL HIGH (ref 30.0–36.0)
MCV: 77.7 fL — ABNORMAL LOW (ref 78.0–100.0)
RBC: 5.51 MIL/uL (ref 4.22–5.81)

## 2013-04-27 NOTE — Assessment & Plan Note (Addendum)
NYHA IIIB. Weight going up despite increase torsemide and metolazone last week but he denies increased dyspnea. Hold off on medication titration due possible volume. Will need RHC to evaluate hemodynamics and help figure out volume status. Continue life vest. Reinforced daily weights, limiting fluid intake to < 2 liters per day, and low salt food choices. Also discussed portion control. Check labs for RHC. Follow up in 1 month

## 2013-04-27 NOTE — Progress Notes (Signed)
Patient ID: Douglas [name], male   DOB: 07/07/1976, 37 y.o.   MRN: [id number] History of Present Illness:  Douglas [name] is a 37 y.o. male with a h/o HTN, HL, obesity and CHF due to NICM. Management has been complicated by severe non-compliance.   Underwent cath in 2006 which showed normal cors with EF 20-25%.   EF had recovered in late 2013 but has again fallen to 15% per echo 01/20/13.  August 2012 CPX text VO2 20  He was admitted to [facility] 02/21/13 from clinic for low output symptoms and volume overload.  He required IV lasix and metolazone.  He diuresed 19 pounds with discharge weight of 254 pounds.  He had runs of VT therefore he was started on amiodarone per EP and Lifevest was placed at discharge.  He underwent RHC on 02/22/13.   RHC 02/22/13  RA = 12  RV = 49/6/15  PA = 54/29 (41)  PCW = 28 (v= 40)  Fick cardiac output/index = 4.3/1.7  PVR = 2.6 Woods  SVR = 1580 dynes  FA sat = 98%  PA sat = 53%, 54%  Non-invasive BP = 124/86 (97)  Evaluated in [facility] ED 03/13/13 for volume overload. Weight up 30 pounds to 291 pounds. He was given IV lasix and Metolazone. Discharged Torsemide and Metolazone 2.5 mg for 3 days.   He returns for follow up today.  Last week Torsemide was increased to 80 mg twice a day and he was given an extra dose of Metolazone. He continued Metolazone on Wed and Sun.  Weight did not go down. Weight at home 292-298 pounds. Denies SOB/PND/Orthopnea. Does admit to dyspnea with steps. Says he has a huge appetite. Sleeps on couch and he is able to sleep on one pillow. Wearing lifevest . No alarms noted.  Taking all medications. Drinking < 2 liters. No lower extremity edema. Walking 2 miles at a time 2-3 times a week.     Past Medical History  Diagnosis Date  . Hypertension   . Hyperlipidemia   . Systolic CHF, chronic   . Obesity   . Nonischemic cardiomyopathy     a.  echo 4/06: EF 30%, mild to mod MR, mild RAE, inf HK, lat HK , ant HK;    b.  cath 4/06: no CAD, EF 20-25%   . ED (erectile dysfunction)   . NSVT (nonsustained ventricular tachycardia)     eval by EP in past; no ICD candidate due to NYHA 1 symptoms  . Shortness of breath     Current Outpatient Prescriptions  Medication Sig Dispense Refill  . amiodarone (PACERONE) 200 MG tablet Take 1 tablet (200 mg total) by mouth daily.  3 tablet  0  . aspirin EC 81 MG tablet Take 81 mg by mouth daily.      . carvedilol (COREG) 6.25 MG tablet Take 1 tablet (6.25 mg total) by mouth 2 (two) times daily with a meal.  60 tablet  3  . hydrALAZINE (APRESOLINE) 25 MG tablet Take 1 tablet (25 mg total) by mouth 3 (three) times daily.  90 tablet  3  . isosorbide mononitrate (IMDUR) 30 MG 24 hr tablet Take 30 mg by mouth daily.      . lisinopril (PRINIVIL,ZESTRIL) 5 MG tablet Take 1 tablet (5 mg total) by mouth 2 (two) times daily.      . metolazone (ZAROXOLYN) 2.5 MG tablet Take 2.5 mg every Monday and Friday and as needed  10 tablet  3  .   potassium chloride SA (K-DUR,KLOR-CON) 20 MEQ tablet Take 1 tablet (20 mEq total) by mouth 2 (two) times daily.  60 tablet  3  . torsemide (DEMADEX) 20 MG tablet Take 4 tablets (80 mg total) by mouth 2 (two) times daily.  240 tablet  3   No current facility-administered medications for this encounter.    Allergies  Allergen Reactions  . Penicillins Other (See Comments)    Unknown childhood reaction     ROS:  Please see the history of present illness.  He denies fevers, chills, melena, hematochezia.  All other systems reviewed and negative.  Vital Signs: Filed Vitals:   04/27/13 0945  BP: 100/70  Pulse: 80  Weight: 298 lb (135.172 kg)  SpO2: 99%     PHYSICAL EXAM: Well nourished, well developed, in no acute distressSister present HEENT: normal Neck: JVP hard to tell due thick neck but does not appear elevated.Carotids 2+ bilaterally.No Bruits  Cardiac:  PMI laterally displaced. Tachycardic with regular rhythm, +S3 Life Vest in place Lungs:  clear to auscultation  bilaterally, no wheezing, rhonchi or rales Ab: obese. Soft NT.  Mild distention.  No bruits. Ext warm. no cyanosis or clubbing, lower extremity edema Skin: warm and dry Psych:normal affect. CNII-XII grossly intact    

## 2013-04-27 NOTE — Patient Instructions (Addendum)
Follow up in 1 month   Do the following things EVERYDAY: 1) Weigh yourself in the morning before breakfast. Write it down and keep it in a log. 2) Take your medicines as prescribed 3) Eat low salt foods-Limit salt (sodium) to 2000 mg per day.  4) Stay as active as you can everyday 5) Limit all fluids for the day to less than 2 liters 

## 2013-04-30 ENCOUNTER — Encounter (HOSPITAL_COMMUNITY): Payer: Self-pay | Admitting: Respiratory Therapy

## 2013-05-03 ENCOUNTER — Ambulatory Visit (HOSPITAL_COMMUNITY)
Admission: RE | Admit: 2013-05-03 | Discharge: 2013-05-03 | Disposition: A | Discharge status: Home / Self Care | Payer: Medicaid Other | Source: Ambulatory Visit | Attending: Internal Medicine | Admitting: Internal Medicine

## 2013-05-03 ENCOUNTER — Encounter (HOSPITAL_COMMUNITY)
Admission: RE | Disposition: A | Discharge status: Home / Self Care | Payer: Self-pay | Source: Ambulatory Visit | Attending: Internal Medicine

## 2013-05-03 ENCOUNTER — Other Ambulatory Visit (HOSPITAL_COMMUNITY): Payer: Self-pay | Admitting: *Deleted

## 2013-05-03 DIAGNOSIS — I428 Other cardiomyopathies: Secondary | ICD-10-CM | POA: Insufficient documentation

## 2013-05-03 DIAGNOSIS — Z7982 Long term (current) use of aspirin: Secondary | ICD-10-CM | POA: Insufficient documentation

## 2013-05-03 DIAGNOSIS — Z9119 Patient's noncompliance with other medical treatment and regimen: Secondary | ICD-10-CM | POA: Insufficient documentation

## 2013-05-03 DIAGNOSIS — E669 Obesity, unspecified: Secondary | ICD-10-CM | POA: Insufficient documentation

## 2013-05-03 DIAGNOSIS — I5022 Chronic systolic (congestive) heart failure: Secondary | ICD-10-CM

## 2013-05-03 DIAGNOSIS — I509 Heart failure, unspecified: Secondary | ICD-10-CM

## 2013-05-03 DIAGNOSIS — I10 Essential (primary) hypertension: Secondary | ICD-10-CM | POA: Insufficient documentation

## 2013-05-03 DIAGNOSIS — E785 Hyperlipidemia, unspecified: Secondary | ICD-10-CM | POA: Insufficient documentation

## 2013-05-03 DIAGNOSIS — Z91199 Patient's noncompliance with other medical treatment and regimen due to unspecified reason: Secondary | ICD-10-CM | POA: Insufficient documentation

## 2013-05-03 DIAGNOSIS — Z6835 Body mass index (BMI) 35.0-35.9, adult: Secondary | ICD-10-CM | POA: Insufficient documentation

## 2013-05-03 DIAGNOSIS — Z79899 Other long term (current) drug therapy: Secondary | ICD-10-CM | POA: Insufficient documentation

## 2013-05-03 HISTORY — PX: RIGHT HEART CATHETERIZATION: SHX5447

## 2013-05-03 LAB — GLUCOSE, CAPILLARY: Glucose-Capillary: 124 mg/dL — ABNORMAL HIGH (ref 70–99)

## 2013-05-03 LAB — POCT I-STAT 3, VENOUS BLOOD GAS (G3P V)
Bicarbonate: 28.9 mEq/L — ABNORMAL HIGH (ref 20.0–24.0)
TCO2: 30 mmol/L (ref 0–100)
pCO2, Ven: 45.9 mmHg (ref 45.0–50.0)
pCO2, Ven: 43.8 mmHg — ABNORMAL LOW (ref 45.0–50.0)
pH, Ven: 7.409 — ABNORMAL HIGH (ref 7.250–7.300)
pH, Ven: 7.428 — ABNORMAL HIGH (ref 7.250–7.300)
pO2, Ven: 34 mmHg (ref 30.0–45.0)

## 2013-05-03 LAB — POCT I-STAT 3, ART BLOOD GAS (G3+): Acid-Base Excess: 6 mmol/L — ABNORMAL HIGH (ref 0.0–2.0)

## 2013-05-03 SURGERY — RIGHT HEART CATH
Anesthesia: LOCAL

## 2013-05-03 MED ORDER — SODIUM CHLORIDE 0.9 % IJ SOLN: 3.0000 mL | INTRAMUSCULAR | Status: DC | PRN

## 2013-05-03 MED ORDER — SODIUM CHLORIDE 0.9 % IV SOLN: 250.0000 mL | INTRAVENOUS | Status: DC | PRN

## 2013-05-03 MED ORDER — SODIUM CHLORIDE 0.9 % IJ SOLN: 3.0000 mL | Freq: Two times a day (BID) | INTRAMUSCULAR | Status: DC

## 2013-05-03 MED ORDER — ONDANSETRON HCL 4 MG/2ML IJ SOLN: 4.0000 mg | Freq: Four times a day (QID) | INTRAMUSCULAR | Status: DC | PRN

## 2013-05-03 MED ORDER — MIDAZOLAM HCL 2 MG/2ML IJ SOLN
INTRAMUSCULAR | Status: AC
  Filled 2013-05-03: qty 2

## 2013-05-03 MED ORDER — FENTANYL CITRATE 0.05 MG/ML IJ SOLN
INTRAMUSCULAR | Status: AC
  Filled 2013-05-03: qty 2

## 2013-05-03 MED ORDER — NITROGLYCERIN 0.2 MG/ML ON CALL CATH LAB
INTRAVENOUS | Status: AC
  Filled 2013-05-03: qty 1

## 2013-05-03 MED ORDER — HEPARIN (PORCINE) IN NACL 2-0.9 UNIT/ML-% IJ SOLN
INTRAMUSCULAR | Status: AC
  Filled 2013-05-03: qty 1000

## 2013-05-03 MED ORDER — ACETAMINOPHEN 325 MG PO TABS: 650.0000 mg | ORAL_TABLET | ORAL | Status: DC | PRN

## 2013-05-03 MED ORDER — SODIUM CHLORIDE 0.9 % IV SOLN
INTRAVENOUS | Status: DC
Start: 2013-05-03 — End: 2013-05-03
  Administered 2013-05-03: 12:00:00 via INTRAVENOUS

## 2013-05-03 MED ORDER — LIDOCAINE HCL (PF) 1 % IJ SOLN
INTRAMUSCULAR | Status: AC
  Filled 2013-05-03: qty 30

## 2013-05-03 MED ORDER — METOLAZONE 2.5 MG PO TABS: ORAL_TABLET | ORAL | Status: DC

## 2013-05-03 NOTE — Interval H&P Note (Signed)
History and Physical Interval Note:  05/03/2013 2:10 PM  Douglas [name]  has presented today for surgery, with the diagnosis of hf  The various methods of treatment have been discussed with the patient and family. After consideration of risks, benefits and other options for treatment, the patient has consented to  Procedure(s): RIGHT HEART CATH (N/A) as a surgical intervention .  The patient's history has been reviewed, patient examined, no change in status, stable for surgery.  I have reviewed the patient's chart and labs.  Questions were answered to the patient's satisfaction.     [name]

## 2013-05-03 NOTE — H&P (View-Only) (Signed)
Patient ID: Douglas [name], male   DOB: 10-Apr-1976, 37 y.o.   MRN: [id number] History of Present Illness:  Douglas [name] is a 37 y.o. male with a h/o HTN, HL, obesity and CHF due to NICM. Management has been complicated by severe non-compliance.   Underwent cath in 2006 which showed normal cors with EF 20-25%.   EF had recovered in late 2013 but has again fallen to 15% per echo 01/20/13.  August 2012 CPX text VO2 20  He was admitted to [facility] 02/21/13 from clinic for low output symptoms and volume overload.  He required IV lasix and metolazone.  He diuresed 19 pounds with discharge weight of 254 pounds.  He had runs of VT therefore he was started on amiodarone per EP and Lifevest was placed at discharge.  He underwent RHC on 02/22/13.   RHC 02/22/13  RA = 12  RV = 49/6/15  PA = 54/29 (41)  PCW = 28 (v= 40)  Fick cardiac output/index = 4.3/1.7  PVR = 2.6 Woods  SVR = 1580 dynes  FA sat = 98%  PA sat = 53%, 54%  Non-invasive BP = 124/86 (97)  Evaluated in [facility] ED 03/13/13 for volume overload. Weight up 30 pounds to 291 pounds. He was given IV lasix and Metolazone. Discharged Torsemide and Metolazone 2.5 mg for 3 days.   He returns for follow up today.  Last week Torsemide was increased to 80 mg twice a day and he was given an extra dose of Metolazone. He continued Metolazone on Wed and Sun.  Weight did not go down. Weight at home 292-298 pounds. Denies SOB/PND/Orthopnea. Does admit to dyspnea with steps. Says he has a huge appetite. Sleeps on couch and he is able to sleep on one pillow. Wearing lifevest . No alarms noted.  Taking all medications. Drinking < 2 liters. No lower extremity edema. Walking 2 miles at a time 2-3 times a week.     Past Medical History  Diagnosis Date  . Hypertension   . Hyperlipidemia   . Systolic CHF, chronic   . Obesity   . Nonischemic cardiomyopathy     a.  echo 4/06: EF 30%, mild to mod MR, mild RAE, inf HK, lat HK , ant HK;    b.  cath 4/06: no CAD, EF 20-25%   . ED (erectile dysfunction)   . NSVT (nonsustained ventricular tachycardia)     eval by EP in past; no ICD candidate due to NYHA 1 symptoms  . Shortness of breath     Current Outpatient Prescriptions  Medication Sig Dispense Refill  . amiodarone (PACERONE) 200 MG tablet Take 1 tablet (200 mg total) by mouth daily.  3 tablet  0  . aspirin EC 81 MG tablet Take 81 mg by mouth daily.      . carvedilol (COREG) 6.25 MG tablet Take 1 tablet (6.25 mg total) by mouth 2 (two) times daily with a meal.  60 tablet  3  . hydrALAZINE (APRESOLINE) 25 MG tablet Take 1 tablet (25 mg total) by mouth 3 (three) times daily.  90 tablet  3  . isosorbide mononitrate (IMDUR) 30 MG 24 hr tablet Take 30 mg by mouth daily.      Marland Kitchen lisinopril (PRINIVIL,ZESTRIL) 5 MG tablet Take 1 tablet (5 mg total) by mouth 2 (two) times daily.      . metolazone (ZAROXOLYN) 2.5 MG tablet Take 2.5 mg every Monday and Friday and as needed  10 tablet  3  .  potassium chloride SA (K-DUR,KLOR-CON) 20 MEQ tablet Take 1 tablet (20 mEq total) by mouth 2 (two) times daily.  60 tablet  3  . torsemide (DEMADEX) 20 MG tablet Take 4 tablets (80 mg total) by mouth 2 (two) times daily.  240 tablet  3   No current facility-administered medications for this encounter.    Allergies  Allergen Reactions  . Penicillins Other (See Comments)    Unknown childhood reaction     ROS:  Please see the history of present illness.  He denies fevers, chills, melena, hematochezia.  All other systems reviewed and negative.  Vital Signs: Filed Vitals:   04/27/13 0945  BP: 100/70  Pulse: 80  Weight: 298 lb (135.172 kg)  SpO2: 99%     PHYSICAL EXAM: Well nourished, well developed, in no acute distressSister present HEENT: normal Neck: JVP hard to tell due thick neck but does not appear elevated.Carotids 2+ bilaterally.No Bruits  Cardiac:  PMI laterally displaced. Tachycardic with regular rhythm, +S3 Life Vest in place Lungs:  clear to auscultation  bilaterally, no wheezing, rhonchi or rales Ab: obese. Soft NT.  Mild distention.  No bruits. Ext warm. no cyanosis or clubbing, lower extremity edema Skin: warm and dry Psych:normal affect. CNII-XII grossly intact

## 2013-05-03 NOTE — CV Procedure (Signed)
Cardiac Cath Procedure Note:  Indication:  HF  Procedures performed:  1) Right heart catheterization  Description of procedure:   The risks and indication of the procedure were explained. Consent was signed and placed on the chart. An appropriate timeout was taken prior to the procedure. The right groin was prepped and draped in the routine sterile fashion and anesthetized with 1% local lidocaine.   A 7 FR venous sheath was placed in the right femoral vein using a modified Seldinger technique. A standard Swan-Ganz catheter was used for the procedure.   Complications: None apparent.  Findings:  RA = 2 RV = 29/2/2 PA = 26/10 (17) PCW = 3 Fick cardiac output/index = 6.1/2.3 PVR = 2.3 FA sat = 93% PA sat = 65%, 67%  Assessment: 1. Low filling pressures 2. Normal cardiac output.  Plan/Discussion:  Weight gain is due to adiposity and not fluid. Change metolazone to 1x/week. Watch diet. Continue current regimen.  Shanesha Bednarz 2:42 PM

## 2013-05-25 ENCOUNTER — Ambulatory Visit (HOSPITAL_COMMUNITY)
Admission: RE | Admit: 2013-05-25 | Discharge: 2013-05-25 | Disposition: A | Discharge status: Home / Self Care | Payer: Medicaid Other | Source: Ambulatory Visit | Attending: Internal Medicine | Admitting: Internal Medicine

## 2013-05-25 ENCOUNTER — Encounter (HOSPITAL_COMMUNITY): Payer: Self-pay

## 2013-05-25 VITALS — BP 98/56 | HR 77 | Wt 306.8 lb

## 2013-05-25 DIAGNOSIS — I10 Essential (primary) hypertension: Secondary | ICD-10-CM | POA: Insufficient documentation

## 2013-05-25 DIAGNOSIS — E785 Hyperlipidemia, unspecified: Secondary | ICD-10-CM | POA: Insufficient documentation

## 2013-05-25 DIAGNOSIS — E669 Obesity, unspecified: Secondary | ICD-10-CM | POA: Insufficient documentation

## 2013-05-25 DIAGNOSIS — Z79899 Other long term (current) drug therapy: Secondary | ICD-10-CM | POA: Insufficient documentation

## 2013-05-25 DIAGNOSIS — Z7982 Long term (current) use of aspirin: Secondary | ICD-10-CM | POA: Insufficient documentation

## 2013-05-25 DIAGNOSIS — I5022 Chronic systolic (congestive) heart failure: Secondary | ICD-10-CM

## 2013-05-25 NOTE — Assessment & Plan Note (Signed)
NYHA II. Volume status stable despite weight gain.  Unable to titrate HF meds due to soft BP. Reinforced daily weights, low salt food choices, and limiting fluid intake to < 2 liters per day. Follow up in 3-4 weeks with an ECHO. Continue life vest. If EF remains < than 35% will refer for ICD.

## 2013-05-25 NOTE — Progress Notes (Signed)
Patient ID: Douglas [name], male   DOB: 12-02-1975, 37 y.o.   MRN: [id number] History of Present Illness: Douglas [name] is a 37 y.o. male with a h/o HTN, HL, obesity and CHF due to NICM. Management has been complicated by severe non-compliance.   Underwent cath in 2006 which showed normal cors with EF 20-25%.   EF had recovered in late 2013 but has again fallen to 15% per echo 01/20/13.  August 2012 CPX text VO2 20  He was admitted to [facility] 02/21/13 from clinic for low output symptoms and volume overload.  He required IV lasix and metolazone.  He diuresed 19 pounds with discharge weight of 254 pounds.  He had runs of VT therefore he was started on amiodarone per EP and Lifevest was placed at discharge.  He underwent RHC on 02/22/13.   RHC 02/22/13  RA = 12  RV = 49/6/15  PA = 54/29 (41)  PCW = 28 (v= 40)  Fick cardiac output/index = 4.3/1.7  PVR = 2.6 Woods  SVR = 1580 dynes  FA sat = 98%  PA sat = 53%, 54%  Non-invasive BP = 124/86 (97)  RHC 05/03/13 RA = 2  RV = 29/2/2  PA = 26/10 (17)  PCW = 3  Fick cardiac output/index = 6.1/2.3  PVR = 2.3  FA sat = 93%  PA sat = 65%, 67%   He returns for follow up today.  Post RHC he was dry and asked to stop metolazone. Denies SOB/PND/Orthopnea. Mild dizziness every now and then. Does admit to dyspnea with steps.  Weight up at home 305-306 which is now his baseline. Appetite is huge. He continues on lifevest.  Able to walk 2 miles slowly. Compliant with medications.  Following low salt diet and limiting fluid intake to < 2 liters per day.      Past Medical History  Diagnosis Date  . Hypertension   . Hyperlipidemia   . Systolic CHF, chronic   . Obesity   . Nonischemic cardiomyopathy     a.  echo 4/06: EF 30%, mild to mod MR, mild RAE, inf HK, lat HK , ant HK;    b.  cath 4/06: no CAD, EF 20-25%  . ED (erectile dysfunction)   . NSVT (nonsustained ventricular tachycardia)     eval by EP in past; no ICD candidate due to NYHA 1 symptoms   . Shortness of breath     Current Outpatient Prescriptions  Medication Sig Dispense Refill  . amiodarone (PACERONE) 200 MG tablet Take 1 tablet (200 mg total) by mouth daily.  3 tablet  0  . aspirin EC 81 MG tablet Take 81 mg by mouth daily.      . carvedilol (COREG) 6.25 MG tablet Take 1 tablet (6.25 mg total) by mouth 2 (two) times daily with a meal.  60 tablet  3  . hydrALAZINE (APRESOLINE) 25 MG tablet Take 1 tablet (25 mg total) by mouth 3 (three) times daily.  90 tablet  3  . isosorbide mononitrate (IMDUR) 30 MG 24 hr tablet Take 30 mg by mouth daily.      Marland Kitchen lisinopril (PRINIVIL,ZESTRIL) 5 MG tablet Take 1 tablet (5 mg total) by mouth 2 (two) times daily.      . metolazone (ZAROXOLYN) 2.5 MG tablet Take 2.5 mg every Monday and Friday and as needed  15 tablet  3  . potassium chloride SA (K-DUR,KLOR-CON) 20 MEQ tablet Take 1 tablet (20 mEq total) by mouth  2 (two) times daily.  60 tablet  3  . torsemide (DEMADEX) 20 MG tablet Take 4 tablets (80 mg total) by mouth 2 (two) times daily.  240 tablet  3   No current facility-administered medications for this encounter.    Allergies  Allergen Reactions  . Penicillins Other (See Comments)    Unknown childhood reaction     ROS:  Please see the history of present illness.  He denies fevers, chills, melena, hematochezia.  All other systems reviewed and negative.  Vital Signs: Filed Vitals:   05/25/13 0953  BP: 98/56  Pulse: 77  Weight: 306 lb 12.8 oz (139.164 kg)  SpO2: 98%     PHYSICAL EXAM: Well nourished, well developed, in no acute distressSister present HEENT: normal Neck: JVP hard to tell due thick neck but does not appear elevated.Carotids 2+ bilaterally.No Bruits  Cardiac:  PMI laterally displaced. Tachycardic with regular rhythm, +S3 Life Vest in place Lungs:  clear to auscultation bilaterally, no wheezing, rhonchi or rales Ab: obese. Soft NT.  Mild distention.  No bruits. Ext warm. no cyanosis or clubbing, lower  extremity edema Skin: warm and dry Psych:normal affect. CNII-XII grossly intact

## 2013-05-25 NOTE — Patient Instructions (Addendum)
Follow up in 3-4 weeks with Dr [name] and an ECHO   Do the following things EVERYDAY: 1) Weigh yourself in the morning before breakfast. Write it down and keep it in a log. 2) Take your medicines as prescribed 3) Eat low salt foods-Limit salt (sodium) to 2000 mg per day.  4) Stay as active as you can everyday 5) Limit all fluids for the day to less than 2 liters

## 2013-06-25 ENCOUNTER — Ambulatory Visit (HOSPITAL_COMMUNITY)
Admission: RE | Admit: 2013-06-25 | Discharge: 2013-06-25 | Disposition: A | Discharge status: Home / Self Care | Payer: Medicaid Other | Source: Ambulatory Visit | Attending: Adult Health | Admitting: Adult Health

## 2013-06-25 ENCOUNTER — Encounter (HOSPITAL_COMMUNITY): Payer: Self-pay

## 2013-06-25 ENCOUNTER — Telehealth (HOSPITAL_COMMUNITY): Payer: Self-pay | Admitting: Anesthesiology

## 2013-06-25 ENCOUNTER — Ambulatory Visit (HOSPITAL_BASED_OUTPATIENT_CLINIC_OR_DEPARTMENT_OTHER)
Admission: RE | Admit: 2013-06-25 | Discharge: 2013-06-25 | Disposition: A | Discharge status: Home / Self Care | Payer: Medicaid Other | Source: Ambulatory Visit | Attending: Internal Medicine | Admitting: Internal Medicine

## 2013-06-25 VITALS — BP 108/76 | HR 74 | Wt 315.8 lb

## 2013-06-25 DIAGNOSIS — I059 Rheumatic mitral valve disease, unspecified: Secondary | ICD-10-CM

## 2013-06-25 DIAGNOSIS — I5022 Chronic systolic (congestive) heart failure: Secondary | ICD-10-CM

## 2013-06-25 DIAGNOSIS — I10 Essential (primary) hypertension: Secondary | ICD-10-CM

## 2013-06-25 DIAGNOSIS — I079 Rheumatic tricuspid valve disease, unspecified: Secondary | ICD-10-CM | POA: Insufficient documentation

## 2013-06-25 DIAGNOSIS — I509 Heart failure, unspecified: Secondary | ICD-10-CM

## 2013-06-25 LAB — BASIC METABOLIC PANEL
CO2: 26 mEq/L (ref 19–32)
Chloride: 89 mEq/L — ABNORMAL LOW (ref 96–112)
Creatinine, Ser: 2.11 mg/dL — ABNORMAL HIGH (ref 0.50–1.35)

## 2013-06-25 MED ORDER — TORSEMIDE 20 MG PO TABS: 60.0000 mg | ORAL_TABLET | Freq: Two times a day (BID) | ORAL | Status: DC

## 2013-06-25 NOTE — Patient Instructions (Addendum)
Increase Carvedilol to 6.25 mg in AM and 12.5 mg (2 tabs) in PM  Labs today  You have been referred to Dr Graciela Husbands Tuesday Sept 16th at 2:00 pm, please arrive 15 min early, [address]  Your physician recommends that you schedule a follow-up appointment in: 6 weeks

## 2013-06-25 NOTE — Progress Notes (Signed)
Echocardiogram 2D Echocardiogram has been performed.  Douglas [name] 06/25/2013, 12:45 PM

## 2013-06-25 NOTE — Telephone Encounter (Signed)
Order placed for labs 8/19

## 2013-06-25 NOTE — Progress Notes (Signed)
Patient ID: Douglas [name], male   DOB: 05-20-1976, 37 y.o.   MRN: [id number]  History of Present Illness: Douglas [name] is a 37 y.o. male with a h/o HTN, HL, obesity and CHF due to NICM. Management has been complicated by severe non-compliance.   August 2012 CPX text VO2 20  Underwent cath in 2006 which showed normal cors with EF 20-25%.   EF had recovered in late 2013 but has again fallen to 15% per echo 01/20/13.  He was admitted to [facility] 02/21/13 from clinic for low output symptoms and volume overload.  He required IV lasix and metolazone.  He diuresed 19 pounds with discharge weight of 254 pounds.  He had runs of VT therefore he was started on amiodarone per EP and Lifevest was placed at discharge.  He underwent RHC on 02/22/13.   RHC 02/22/13  RA = 12  RV = 49/6/15  PA = 54/29 (41)  PCW = 28 (v= 40)  Fick cardiac output/index = 4.3/1.7  PVR = 2.6 Woods  SVR = 1580 dynes  FA sat = 98%  PA sat = 53%, 54%  Non-invasive BP = 124/86 (97)  RHC 05/03/13 RA = 2  RV = 29/2/2  PA = 26/10 (17)  PCW = 3  Fick cardiac output/index = 6.1/2.3  PVR = 2.3  FA sat = 93%  PA sat = 65%, 67%  Follow up Feeling good. Denies any CP,orthopnea or SOB. Weight at home 305-307. Wearing LifeVest at night. Taking medicaitons as prescribed and needs refills. Following low salt diet and drinking less than 2L day. Echo today with EF in th 25-30% range  Past Medical History  Diagnosis Date  . Hypertension   . Hyperlipidemia   . Systolic CHF, chronic   . Obesity   . Nonischemic cardiomyopathy     a.  echo 4/06: EF 30%, mild to mod MR, mild RAE, inf HK, lat HK , ant HK;    b.  cath 4/06: no CAD, EF 20-25%  . ED (erectile dysfunction)   . NSVT (nonsustained ventricular tachycardia)     eval by EP in past; no ICD candidate due to NYHA 1 symptoms  . Shortness of breath     Current Outpatient Prescriptions  Medication Sig Dispense Refill  . amiodarone (PACERONE) 200 MG tablet Take 1 tablet (200 mg  total) by mouth daily.  3 tablet  0  . aspirin EC 81 MG tablet Take 81 mg by mouth daily.      . carvedilol (COREG) 6.25 MG tablet Take 1 tablet (6.25 mg total) by mouth 2 (two) times daily with a meal.  60 tablet  3  . hydrALAZINE (APRESOLINE) 25 MG tablet Take 1 tablet (25 mg total) by mouth 3 (three) times daily.  90 tablet  3  . isosorbide mononitrate (IMDUR) 30 MG 24 hr tablet Take 30 mg by mouth daily.      Marland Kitchen lisinopril (PRINIVIL,ZESTRIL) 5 MG tablet Take 1 tablet (5 mg total) by mouth 2 (two) times daily.      . metolazone (ZAROXOLYN) 2.5 MG tablet Take 2.5 mg every Monday and Friday and as needed  15 tablet  3  . potassium chloride SA (K-DUR,KLOR-CON) 20 MEQ tablet Take 1 tablet (20 mEq total) by mouth 2 (two) times daily.  60 tablet  3  . torsemide (DEMADEX) 20 MG tablet Take 4 tablets (80 mg total) by mouth 2 (two) times daily.  240 tablet  3   No  current facility-administered medications for this encounter.    Allergies  Allergen Reactions  . Penicillins Other (See Comments)    Unknown childhood reaction     ROS:  Please see the history of present illness.  He denies fevers, chills, melena, hematochezia.  All other systems reviewed and negative.  Vital Signs: Filed Vitals:   06/25/13 0934  BP: 108/76  Pulse: 74  Weight: 315 lb 12.8 oz (143.246 kg)  SpO2: 98%  Last weight: 306  PHYSICAL EXAM: Well nourished, well developed, in no acute distress  HEENT: normal Neck: JVP hard to tell due thick neck but does not appear elevated.Carotids 2+ bilaterally.No Bruits  Cardiac:  PMI laterally displaced. Regular rate and rhythm, Lungs:  clear to auscultation bilaterally, no wheezing, rhonchi or rales Ab: obese. Soft NT.  Mild distention.  No bruits. Ext warm. no cyanosis or clubbing, lower extremity edema Skin: warm and dry Psych:normal affect. CNII-XII grossly intact  1) Chronic systolic HF, NICM, EF 25-30% - NYHA II. Volume status good.  - ECHO today EF 25-30%, Moderate  RV dysfunction - somewhat improved from previous  Will refer to EP for consideration of ICD implantation. - Will increase coreg to 6.25/12.5. Continue current doses of ACE-I, hydralazine, INDUR and Spiro - Check BMET - F/U 6 weeks  2) HTN -as above will increase coreg to 6.25/12.5mg   Aundria Rud NP-C 6:50 PM  Patient seen and examined with Ulla Potash, NP. We discussed all aspects of the encounter. I agree with the assessment and plan as stated above.  He is more compliant with his meds and feeling better. I reviewed echo today and EF 25-30% range which is slightly improved from previous. Agree with titration of b-blocker and referral to EP. Counseled him on the need for weight loss.   Koda Routon,MD 11:49 PM

## 2013-06-25 NOTE — Addendum Note (Signed)
Addended by: Noralee Space on: 06/25/2013 11:40 AM   Modules accepted: Orders

## 2013-06-25 NOTE — Telephone Encounter (Signed)
Cr elevated. Will hold pm dose of demadex and tomorrows dose. Cut back to 60 mg BID and recheck BMET next week.

## 2013-07-03 ENCOUNTER — Other Ambulatory Visit: Payer: Self-pay

## 2013-07-18 IMAGING — CR DG CHEST 2V
2 series · 2 of 2 positions shown · non-contrast
Comparison: 06/05/2011

CLINICAL DATA: Chest pain.  Shortness of breath.  Cough.  History
of CHF.

CHEST - 2 VIEW

[w chest pa]
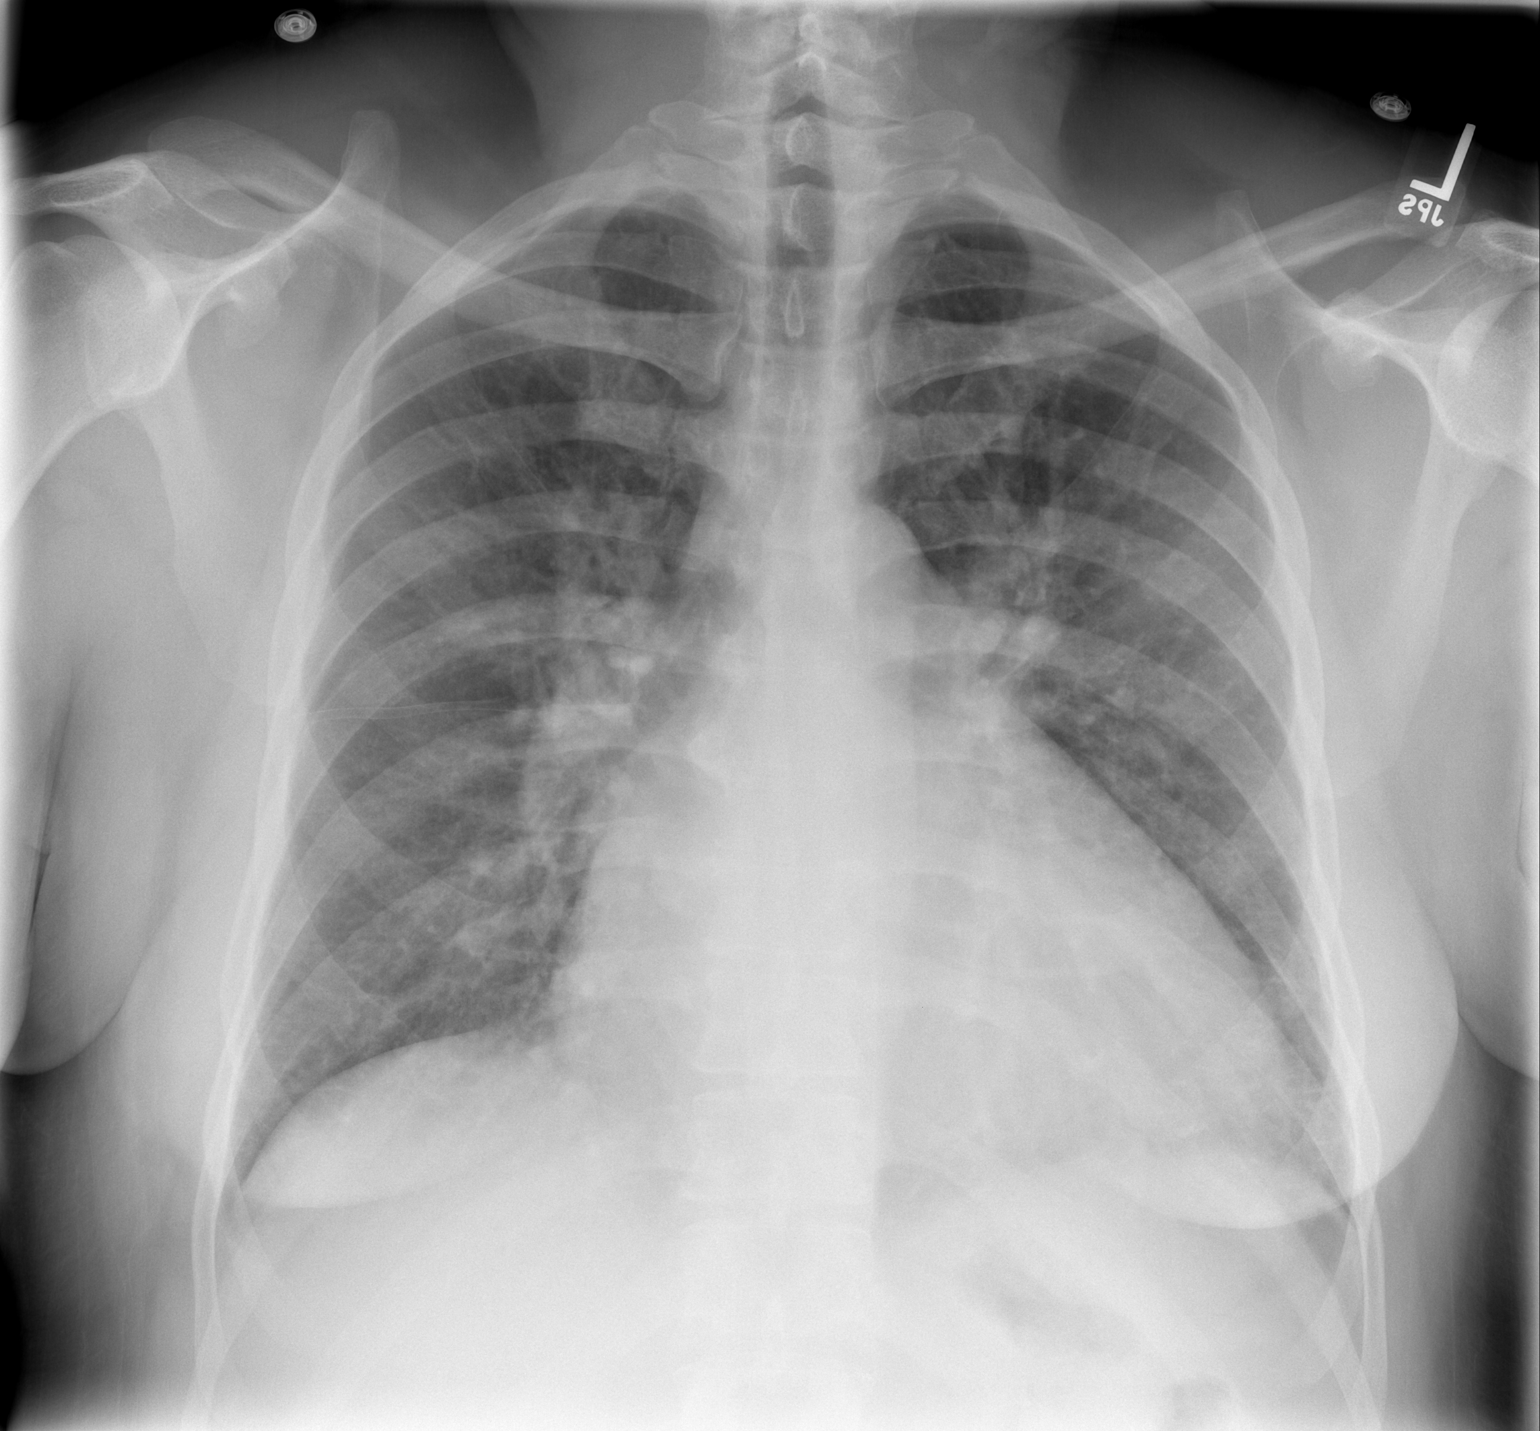

[w chest lat]
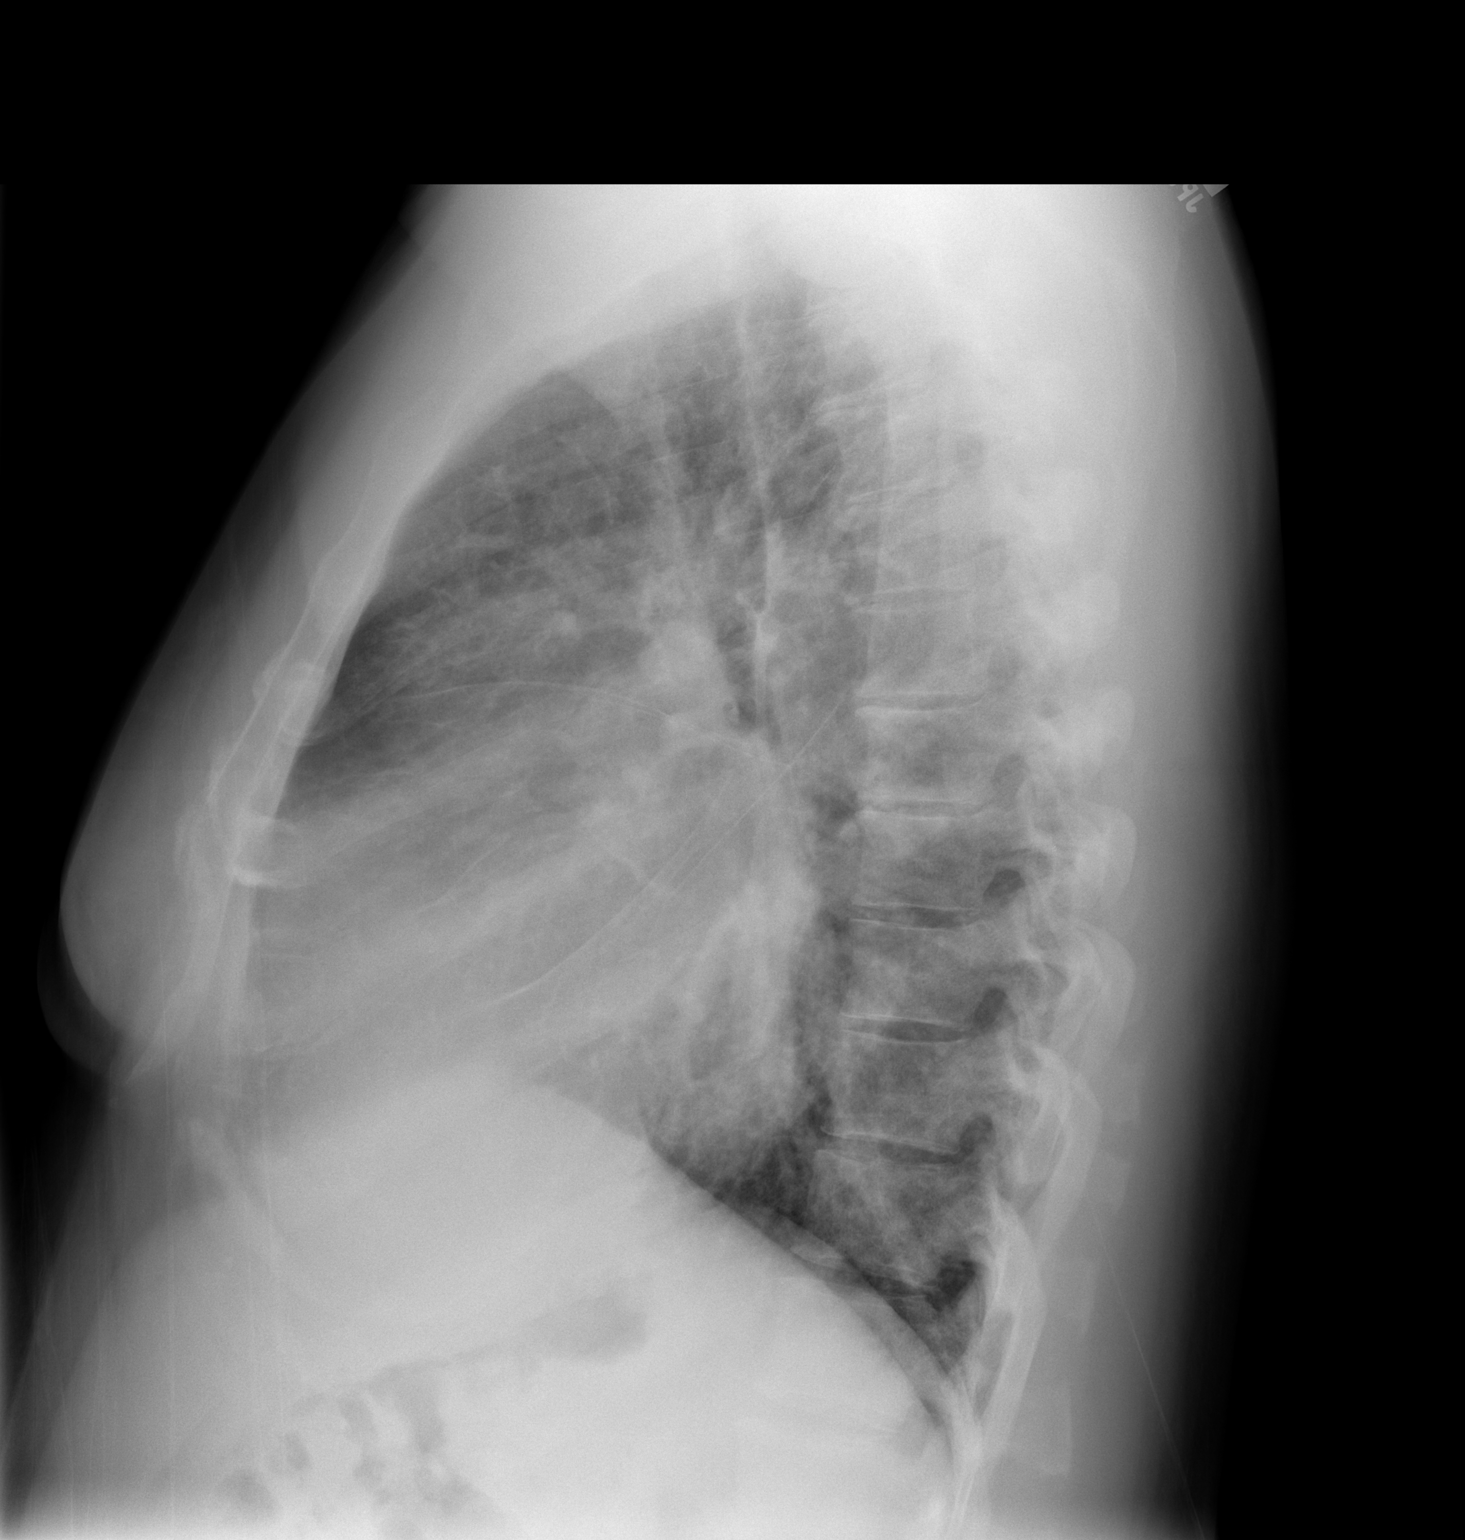

[2 of 2 positions shown; findings below may reference images not displayed]

FINDINGS: Midline trachea.  Moderate cardiomegaly. Mediastinal
contours otherwise within normal limits.  No pleural effusion or
pneumothorax.  Mild interstitial edema is not significantly
changed.
IMPRESSION: Moderate congestive heart failure, similar to 06/05/2011.

## 2013-07-31 ENCOUNTER — Encounter: Payer: Self-pay | Admitting: Internal Medicine

## 2013-07-31 ENCOUNTER — Ambulatory Visit (INDEPENDENT_AMBULATORY_CARE_PROVIDER_SITE_OTHER): Payer: Medicaid Other | Admitting: Internal Medicine

## 2013-07-31 ENCOUNTER — Encounter: Payer: Self-pay | Admitting: *Deleted

## 2013-07-31 VITALS — BP 115/73 | HR 71 | Ht 76.0 in | Wt 328.0 lb

## 2013-07-31 DIAGNOSIS — I5022 Chronic systolic (congestive) heart failure: Secondary | ICD-10-CM

## 2013-07-31 MED ORDER — AMIODARONE HCL 200 MG PO TABS: 200.0000 mg | ORAL_TABLET | Freq: Every day | ORAL | Status: DC

## 2013-07-31 NOTE — Progress Notes (Signed)
ELECTROPHYSIOLOGY CONSULT NOTE  Patient ID: Douglas [name], MRN: [id number], DOB/AGE: [birth year] 39 y.o. Admit date: (Not on file) Date of Consult: 07/31/2013  Primary Physician: Pcp Not In System Primary Cardiologist:  DB/CHF  Chief Complaint:     [name] is a 37 y.o. male  Referred for consideration of an ICD.  He was initially diagnosed with a nonischemic cardiomyopathy in 2006. At that time catheterization demonstrated normal coronary arteries; his ejection fraction was 20-25% by 2013 his ejection fraction is recovered; however, March 2014 insulin to 15%. He was hospitalized and diuresed. Nonsustained ventricular tachycardia was noted and he was treated with amiodarone and a LifeVest.  Echocardiogram 8/14 demonstrated ejection fraction still in 25-30% range.  The patient denies chest pain,   He has modest shortness of breath with exertion  HTN still high Past Medical History  Diagnosis Date  . Hypertension   . Hyperlipidemia   . Systolic CHF, chronic   . Obesity   . Nonischemic cardiomyopathy     a.  echo 4/06: EF 30%, mild to mod MR, mild RAE, inf HK, lat HK , ant HK;    b.  cath 4/06: no CAD, EF 20-25%  . ED (erectile dysfunction)   . NSVT (nonsustained ventricular tachycardia)     eval by EP in past; no ICD candidate due to NYHA 1 symptoms  . Shortness of breath       Surgical History:  Past Surgical History  Procedure Laterality Date  . Cardiac catheterization  09/2011  . Multiple extractions with alveoloplasty N/A 01/26/2013    Procedure:  EXTRACION  TOOTH # 19 WITH ALVEOLOPLASTY;  Surgeon: Charlynne Pander, DDS;  Location: [facility] OR;  Service: Oral Surgery;  Laterality: N/A;     Home Meds: Prior to Admission medications   Medication Sig Start Date End Date Taking? Authorizing Provider  amiodarone (PACERONE) 200 MG tablet Take 1 tablet (200 mg total) by mouth daily. 03/31/13  Yes Dayna N Dunn, PA-C  aspirin EC 81 MG tablet Take 81 mg by mouth  daily.   Yes Historical Provider, MD  carvedilol (COREG) 6.25 MG tablet Take 1 tablet (6.25 mg total) by mouth 2 (two) times daily with a meal. 02/26/13  Yes Amy D Clegg, NP  hydrALAZINE (APRESOLINE) 25 MG tablet Take 1 tablet (25 mg total) by mouth 3 (three) times daily. 02/26/13  Yes Amy D Clegg, NP  isosorbide mononitrate (IMDUR) 30 MG 24 hr tablet Take 30 mg by mouth daily.   Yes Historical Provider, MD  lisinopril (PRINIVIL,ZESTRIL) 5 MG tablet Take 1 tablet (5 mg total) by mouth 2 (two) times daily. 03/06/13  Yes Hadassah Pais, PA-C  metolazone (ZAROXOLYN) 2.5 MG tablet Take 2.5 mg every Monday and Friday and as needed 05/03/13  Yes Dolores Patty, MD  potassium chloride SA (K-DUR,KLOR-CON) 20 MEQ tablet Take 1 tablet (20 mEq total) by mouth 2 (two) times daily. 03/16/13  Yes Amy D Clegg, NP  torsemide (DEMADEX) 20 MG tablet Take 3 tablets (60 mg total) by mouth 2 (two) times daily. 06/25/13  Yes Aundria Rud, NP       Allergies:  Allergies  Allergen Reactions  . Penicillins Other (See Comments)    Unknown childhood reaction     History   Social History  . Marital Status: Legally Separated    Spouse Name: N/A    Number of Children: 5  . Years of Education: N/A   Occupational History  .  unemployed    Social History Main Topics  . Smoking status: Current Every Day Smoker -- 0.30 packs/day for 4 years    Types: Cigarettes  . Smokeless tobacco: Never Used  . Alcohol Use: 3.6 oz/week    6 Cans of beer per week     Comment: 06/22/2012 "maybe 6 pk beer/wk"  . Drug Use: Yes    Special: Cocaine     Comment: 06/22/2012 "last cocaine ~ 6 months ago"  Last use of cocaine in October 2013  . Sexual Activity: Yes   Other Topics Concern  . Not on file   Social History Narrative  . No narrative on file     Family History  Problem Relation Age of Onset  . Coronary artery disease Mother 3    s/p PCI  . Lung cancer Father   . Diabetes type II Maternal Uncle   . Coronary artery  disease Maternal Uncle      ROS:  Please see the history of present illness.     All other systems reviewed and negative.    Physical Exam: Blood pressure 115/73, pulse 71, height 6\' 4"  (1.93 m), weight 328 lb (148.78 kg). General: Well developed, .obese   male in no acute distress. Head: Normocephalic, atraumatic, sclera non-icteric, no xanthomas, nares are without discharge. EENT: normal Lymph Nodes:  none Back: without scoliosis/kyphosis, no CVA tendersness Neck: Negative for carotid bruits. JVD could not see Lungs: Clear bilaterally to auscultation without wheezes, rales, or rhonchi. Breathing is unlabored. Heart: RRR with S1 S2. No murmur , rubs, or gallops appreciated. Abdomen: Soft, non-tender, non-distended with normoactive bowel sounds. No hepatomegaly. No rebound/guarding. No obvious abdominal masses. Msk:  Strength and tone appear normal for age. Extremities: No clubbing or cyanosis. No edema.  Distal pedal pulses are 2+ and equal bilaterally. Skin: Warm and Dry Neuro: Alert and oriented X 3. CN III-XII intact Grossly normal sensory and motor function . Psych:  Responds to questions appropriately with a normal affect.      Labs: Cardiac Enzymes No results found for this basename: CKTOTAL, CKMB, TROPONINI,  in the last 72 hours CBC Lab Results  Component Value Date   WBC 7.1 04/27/2013   HGB 15.6 04/27/2013   HCT 42.8 04/27/2013   MCV 77.7* 04/27/2013   PLT 213 04/27/2013   PROTIME: No results found for this basename: LABPROT, INR,  in the last 72 hours Chemistry No results found for this basename: NA, K, CL, CO2, BUN, CREATININE, CALCIUM, LABALBU, PROT, BILITOT, ALKPHOS, ALT, AST, GLUCOSE,  in the last 168 hours Lipids Lab Results  Component Value Date   CHOL 169 06/23/2012   HDL 47 06/23/2012   LDLCALC 161* 06/23/2012   TRIG 97 06/23/2012   BNP Pro B Natriuretic peptide (BNP)  Date/Time Value Range Status  03/13/2013  9:43 AM 1914.0* 0 - 125 pg/mL Final  02/20/2013   2:50 PM 2002.0* 0 - 125 pg/mL Final  01/24/2013 11:02 AM 3126.0* 0 - 125 pg/mL Final  01/22/2013  5:12 AM 1853.0* 0 - 125 pg/mL Final   Miscellaneous Lab Results  Component Value Date   DDIMER 0.49* 06/17/2011    Radiology/Studies:  No results found.  WRU:EAVWU rhythm at 71 with intervals 20/10/42 Axis XXXVII   Assessment and Plan: *  Nonischemic cardiomyopathy  Persisting ischemic cardiomyopathy despite guidelines directed medical therapy. It is appropriate to consider ICD implantation for sudden death prevention. His overall risks are increased by his ventricular tachycardia; however, these do not  track is particularly closely arrhythmic death. He is on amiodarone; we will plan to discontinue it after she wants of monitoring supposed to assess his burden of nonsustained ventricular tachycardia  Have reviewed the potential benefits and risks of ICD implantation including but not limited to death, perforation of heart or lung, lead dislodgement, infection,  device malfunction and inappropriate shocks.  The patient express understanding  and are willing to proceed.      Congestive heart failure-chronic-systolic: He is euvolemic. We'll continue his current medications in conjunction apart their clinic   Disrupted nocturnal breathing: Within increase in weight, he again has symptoms of sleep apnea. Sleep testing is appropriate   Morbid obesity: We spent number of minutes reviewing the importance of exercise and strategies whereby he may obtain some time to do that      [employer]

## 2013-07-31 NOTE — Patient Instructions (Addendum)
Your physician has recommended that you have a sleep study. This test records several body functions during sleep, including: brain activity, eye movement, oxygen and carbon dioxide blood levels, heart rate and rhythm, breathing rate and rhythm, the flow of air through your mouth and nose, snoring, body muscle movements, and chest and belly movement.  Your physician recommends that you continue on your current medications as directed. Please refer to the Current Medication list given to you today.  Your physician has recommended that you have a defibrillator inserted. An implantable cardioverter defibrillator (ICD) is a small device that is placed in your chest or, in rare cases, your abdomen. This device uses electrical pulses or shocks to help control life-threatening, irregular heartbeats that could lead the heart to suddenly stop beating (sudden cardiac arrest). Leads are attached to the ICD that goes into your heart. This is done in the hospital and usually requires an overnight stay. Please see the instruction sheet given to you today for more information.

## 2013-08-02 ENCOUNTER — Other Ambulatory Visit (HOSPITAL_COMMUNITY): Payer: Self-pay | Admitting: Adult Health

## 2013-08-08 ENCOUNTER — Inpatient Hospital Stay (HOSPITAL_COMMUNITY)
Admission: RE | Admit: 2013-08-08 | Discharge: 2013-08-08 | Disposition: A | Discharge status: Home / Self Care | Payer: Self-pay | Source: Ambulatory Visit

## 2013-08-08 NOTE — Progress Notes (Signed)
error 

## 2013-08-10 ENCOUNTER — Telehealth: Payer: Self-pay | Admitting: Internal Medicine

## 2013-08-10 ENCOUNTER — Encounter: Payer: Self-pay | Admitting: *Deleted

## 2013-08-10 ENCOUNTER — Telehealth: Payer: Self-pay | Admitting: *Deleted

## 2013-08-10 NOTE — Telephone Encounter (Signed)
Rescheduled ICD procedure to 08/23/13 at 9:30am. Patient agreeable to plan. Will send letter out in mail as a reminder of date change.

## 2013-08-10 NOTE — Telephone Encounter (Signed)
LMTCB - reschedule procedure

## 2013-08-10 NOTE — Telephone Encounter (Signed)
New Problem:  PT states the nurse was going to reschedule a day surgery appt.

## 2013-08-10 NOTE — Telephone Encounter (Signed)
Follow UP:  Pt states he is returning Sherri's call.

## 2013-08-13 ENCOUNTER — Encounter (HOSPITAL_COMMUNITY): Payer: Self-pay | Admitting: Pharmacy Technician

## 2013-08-14 ENCOUNTER — Telehealth: Payer: Self-pay | Admitting: *Deleted

## 2013-08-14 NOTE — Telephone Encounter (Signed)
Called patient to remind him of pre-procedure lab draw tomorrow. Left message reminder

## 2013-08-15 ENCOUNTER — Other Ambulatory Visit: Payer: Self-pay

## 2013-08-17 ENCOUNTER — Ambulatory Visit (HOSPITAL_COMMUNITY)
Admission: RE | Admit: 2013-08-17 | Discharge: 2013-08-17 | Disposition: A | Discharge status: Home / Self Care | Payer: Medicaid Other | Source: Ambulatory Visit | Attending: Internal Medicine | Admitting: Internal Medicine

## 2013-08-17 VITALS — BP 104/70 | HR 93 | Wt 330.0 lb

## 2013-08-17 DIAGNOSIS — I472 Ventricular tachycardia, unspecified: Secondary | ICD-10-CM | POA: Insufficient documentation

## 2013-08-17 DIAGNOSIS — I5022 Chronic systolic (congestive) heart failure: Secondary | ICD-10-CM | POA: Insufficient documentation

## 2013-08-17 DIAGNOSIS — I10 Essential (primary) hypertension: Secondary | ICD-10-CM | POA: Insufficient documentation

## 2013-08-17 DIAGNOSIS — I4729 Other ventricular tachycardia: Secondary | ICD-10-CM | POA: Insufficient documentation

## 2013-08-17 MED ORDER — METOLAZONE 2.5 MG PO TABS: 2.5000 mg | ORAL_TABLET | ORAL | Status: DC | PRN

## 2013-08-17 NOTE — Patient Instructions (Addendum)
Take 2.5 mg Metolazone today  Follow up in 2 months  Do the following things EVERYDAY: 1) Weigh yourself in the morning before breakfast. Write it down and keep it in a log. 2) Take your medicines as prescribed 3) Eat low salt foods-Limit salt (sodium) to 2000 mg per day.  4) Stay as active as you can everyday 5) Limit all fluids for the day to less than 2 liters

## 2013-08-17 NOTE — Progress Notes (Signed)
Patient ID: Douglas [name], male   DOB: [birth year], 37 y.o.   MRN: [id number] EP: Dr Graciela Husbands History of Present Illness: Douglas [name] is a 37 y.o. male with a h/o HTN, HL, obesity and CHF due to NICM. Management has been complicated by severe non-compliance.   August 2012 CPX text VO2 20  Underwent cath in 2006 which showed normal cors with EF 20-25%.   EF had recovered in late 2013 but has again fallen to 15% per echo 01/20/13.  He was admitted to [facility] 02/21/13 from clinic for low output symptoms and volume overload.  He required IV lasix and metolazone.  He diuresed 19 pounds with discharge weight of 254 pounds.  He had runs of VT therefore he was started on amiodarone per EP and Lifevest was placed at discharge.  He underwent RHC on 02/22/13.   RHC 02/22/13  RA = 12  RV = 49/6/15  PA = 54/29 (41)  PCW = 28 (v= 40)  Fick cardiac output/index = 4.3/1.7  PVR = 2.6 Woods  SVR = 1580 dynes  FA sat = 98%  PA sat = 53%, 54%  Non-invasive BP = 124/86 (97)  RHC 05/03/13 RA = 2  RV = 29/2/2  PA = 26/10 (17)  PCW = 3  Fick cardiac output/index = 6.1/2.3  PVR = 2.3  FA sat = 93%  PA sat = 65%, 67%  ECHO 06/25/13 EF 20-25%  He returns for follow up. Last visit night time carvedilol increased to 12.5 mg.  Denies SOB/PND/Orthopnea. Does admit to SOB with hills. Compliant with medications. Weight at home 320-330 pounds. Plans for ICD 08/23/13. Limiting fluid intake to < 2 liters per day.Eating several meal Not exercising.    Past Medical History  Diagnosis Date  . Hypertension   . Hyperlipidemia   . Systolic CHF, chronic   . Obesity   . Nonischemic cardiomyopathy     a.  echo 4/06: EF 30%, mild to mod MR, mild RAE, inf HK, lat HK , ant HK;    b.  cath 4/06: no CAD, EF 20-25%  . ED (erectile dysfunction)   . NSVT (nonsustained ventricular tachycardia)     eval by EP in past; no ICD candidate due to NYHA 1 symptoms  . Shortness of breath     Current Outpatient Prescriptions   Medication Sig Dispense Refill  . amiodarone (PACERONE) 200 MG tablet       . aspirin EC 81 MG tablet Take 81 mg by mouth daily.      . carvedilol (COREG) 6.25 MG tablet Take 6.25 mg by mouth 2 (two) times daily with a meal. Take 6.25 in am Take 12.5 mg inp pm      . hydrALAZINE (APRESOLINE) 25 MG tablet Take 1 tablet (25 mg total) by mouth 3 (three) times daily.  90 tablet  3  . isosorbide mononitrate (IMDUR) 30 MG 24 hr tablet Take 30 mg by mouth daily.      Marland Kitchen lisinopril (PRINIVIL,ZESTRIL) 5 MG tablet Take 1 tablet (5 mg total) by mouth 2 (two) times daily.      . potassium chloride SA (K-DUR,KLOR-CON) 20 MEQ tablet Take 1 tablet (20 mEq total) by mouth 2 (two) times daily.  60 tablet  3  . torsemide (DEMADEX) 20 MG tablet Take 60 mg by mouth 2 (two) times daily.       No current facility-administered medications for this encounter.    Allergies  Allergen Reactions  .  Penicillins Other (See Comments)    Unknown childhood reaction     ROS:  Please see the history of present illness.  He denies fevers, chills, melena, hematochezia.  All other systems reviewed and negative.  Vital Signs: Filed Vitals:   08/17/13 0929  BP: 104/70  Pulse: 93  Weight: 330 lb (149.687 kg)  SpO2: 99%  Last weight: 306>330 pounds.   PHYSICAL EXAM: Well nourished, well developed, in no acute distress  HEENT: normal Neck: JVP hard to tell due thick neck but does not appear elevated.Carotids 2+ bilaterally.No Bruits  Cardiac:  PMI laterally displaced. Regular rate and rhythm, Lungs:  clear to auscultation bilaterally, no wheezing, rhonchi or rales Ab: obese. Soft NT.  Mild distention.  No bruits. Ext warm. no cyanosis or clubbing, lower extremity edema Skin: warm and dry Psych:normal affect. CNII-XII grossly intact  1) Chronic systolic HF, NICM, EF 25-30% - NYHA II. Volume status mildly elevated.   Continue Torsemide 60 mg bid and will give 2.5 mg Metolazone today. Would like his weight  down below  320 pounds.  - ECHO today EF 25-30%, Moderate RV dysfunction . EP to place ICD next  Week. - Continue coreg to 6.25/12.5.  -Continue lisinopril 5 mg twice a day - Continue   Hydralazine 25 mg tid, Imdur 30 mg daily I have encouraged him to start exercising and limits portions at meals.  -  F/U 6 weeks   2) HTN Stable -Continue coreg to 6.25/12.5mg  -Continue lisinopril 5 mg twice a day  3) NSVT  Continue amiodarone 200 mg daily  Leticia Coletta NP-C 9:32 AM

## 2013-08-20 ENCOUNTER — Other Ambulatory Visit (INDEPENDENT_AMBULATORY_CARE_PROVIDER_SITE_OTHER): Payer: Medicaid Other

## 2013-08-20 DIAGNOSIS — I5022 Chronic systolic (congestive) heart failure: Secondary | ICD-10-CM

## 2013-08-20 LAB — HEPATIC FUNCTION PANEL
ALT: 40 U/L (ref 0–53)
AST: 31 U/L (ref 0–37)
Albumin: 4 g/dL (ref 3.5–5.2)
Alkaline Phosphatase: 65 U/L (ref 39–117)
Bilirubin, Direct: 0 mg/dL (ref 0.0–0.3)
Total Protein: 7.7 g/dL (ref 6.0–8.3)

## 2013-08-20 LAB — CBC WITH DIFFERENTIAL/PLATELET
Basophils Absolute: 0 10*3/uL (ref 0.0–0.1)
Basophils Relative: 0.5 % (ref 0.0–3.0)
Eosinophils Absolute: 0.2 10*3/uL (ref 0.0–0.7)
Eosinophils Relative: 2.9 % (ref 0.0–5.0)
HCT: 40.8 % (ref 39.0–52.0)
Hemoglobin: 14.1 g/dL (ref 13.0–17.0)
Lymphocytes Relative: 32.5 % (ref 12.0–46.0)
Lymphs Abs: 2.5 10*3/uL (ref 0.7–4.0)
Monocytes Relative: 12.6 % — ABNORMAL HIGH (ref 3.0–12.0)
Neutro Abs: 3.9 10*3/uL (ref 1.4–7.7)
Neutrophils Relative %: 51.5 % (ref 43.0–77.0)
RBC: 4.58 Mil/uL (ref 4.22–5.81)
RDW: 13.6 % (ref 11.5–14.6)

## 2013-08-20 LAB — BASIC METABOLIC PANEL
Calcium: 9 mg/dL (ref 8.4–10.5)
GFR: 51.67 mL/min — ABNORMAL LOW (ref >60.00)
Glucose, Bld: 120 mg/dL — ABNORMAL HIGH (ref 70–99)
Potassium: 3.6 mEq/L (ref 3.5–5.1)
Sodium: 132 mEq/L — ABNORMAL LOW (ref 135–145)

## 2013-08-20 LAB — PROTIME-INR
INR: 1 ratio (ref 0.8–1.0)
Prothrombin Time: 10.6 s (ref 10.2–12.4)

## 2013-08-22 MED ORDER — SODIUM CHLORIDE 0.9 % IR SOLN
80.0000 mg | Status: DC
  Filled 2013-08-22: qty 2

## 2013-08-22 MED ORDER — VANCOMYCIN HCL IN DEXTROSE 1-5 GM/200ML-% IV SOLN
1000.0000 mg | INTRAVENOUS | Status: DC
  Filled 2013-08-22: qty 200

## 2013-08-23 ENCOUNTER — Encounter (HOSPITAL_COMMUNITY): Payer: Self-pay | Admitting: General Practice

## 2013-08-23 ENCOUNTER — Ambulatory Visit (HOSPITAL_COMMUNITY)
Admission: RE | Admit: 2013-08-23 | Discharge: 2013-08-24 | Disposition: A | Discharge status: Home / Self Care | Payer: Medicaid Other | Source: Ambulatory Visit | Attending: Internal Medicine | Admitting: Internal Medicine

## 2013-08-23 ENCOUNTER — Encounter (HOSPITAL_COMMUNITY)
Admission: RE | Disposition: A | Discharge status: Home / Self Care | Payer: Self-pay | Source: Ambulatory Visit | Attending: Internal Medicine

## 2013-08-23 DIAGNOSIS — I472 Ventricular tachycardia, unspecified: Secondary | ICD-10-CM | POA: Insufficient documentation

## 2013-08-23 DIAGNOSIS — Z23 Encounter for immunization: Secondary | ICD-10-CM | POA: Insufficient documentation

## 2013-08-23 DIAGNOSIS — I428 Other cardiomyopathies: Secondary | ICD-10-CM | POA: Insufficient documentation

## 2013-08-23 DIAGNOSIS — R0602 Shortness of breath: Secondary | ICD-10-CM | POA: Insufficient documentation

## 2013-08-23 DIAGNOSIS — I509 Heart failure, unspecified: Secondary | ICD-10-CM | POA: Insufficient documentation

## 2013-08-23 DIAGNOSIS — I4729 Other ventricular tachycardia: Secondary | ICD-10-CM | POA: Insufficient documentation

## 2013-08-23 DIAGNOSIS — I10 Essential (primary) hypertension: Secondary | ICD-10-CM | POA: Insufficient documentation

## 2013-08-23 DIAGNOSIS — E669 Obesity, unspecified: Secondary | ICD-10-CM | POA: Insufficient documentation

## 2013-08-23 DIAGNOSIS — E785 Hyperlipidemia, unspecified: Secondary | ICD-10-CM | POA: Insufficient documentation

## 2013-08-23 DIAGNOSIS — I5022 Chronic systolic (congestive) heart failure: Secondary | ICD-10-CM | POA: Insufficient documentation

## 2013-08-23 HISTORY — PX: CARDIAC DEFIBRILLATOR PLACEMENT: SHX171

## 2013-08-23 HISTORY — PX: IMPLANTABLE CARDIOVERTER DEFIBRILLATOR IMPLANT: SHX5473

## 2013-08-23 LAB — SURGICAL PCR SCREEN
MRSA, PCR: NEGATIVE
Staphylococcus aureus: POSITIVE — AB

## 2013-08-23 SURGERY — IMPLANTABLE CARDIOVERTER DEFIBRILLATOR IMPLANT
Anesthesia: LOCAL

## 2013-08-23 MED ORDER — CARVEDILOL 6.25 MG PO TABS
6.2500 mg | ORAL_TABLET | Freq: Two times a day (BID) | ORAL | Status: DC
  Administered 2013-08-23 – 2013-08-24 (×2): 6.25 mg via ORAL
  Filled 2013-08-23 (×4): qty 1

## 2013-08-23 MED ORDER — AMIODARONE HCL 200 MG PO TABS
200.0000 mg | ORAL_TABLET | Freq: Every day | ORAL | Status: DC
  Administered 2013-08-24: 11:00:00 200 mg via ORAL
  Filled 2013-08-23: qty 1

## 2013-08-23 MED ORDER — LIDOCAINE HCL (PF) 1 % IJ SOLN
INTRAMUSCULAR | Status: AC
  Filled 2013-08-23: qty 60

## 2013-08-23 MED ORDER — VANCOMYCIN HCL 500 MG IV SOLR
500.0000 mg | Freq: Once | INTRAVENOUS | Status: AC
  Administered 2013-08-23: 500 mg via INTRAVENOUS
  Filled 2013-08-23: qty 500

## 2013-08-23 MED ORDER — INFLUENZA VAC SPLIT QUAD 0.5 ML IM SUSP
0.5000 mL | INTRAMUSCULAR | Status: AC
  Administered 2013-08-24: 0.5 mL via INTRAMUSCULAR
  Filled 2013-08-23: qty 0.5

## 2013-08-23 MED ORDER — FENTANYL CITRATE 0.05 MG/ML IJ SOLN
INTRAMUSCULAR | Status: AC
  Filled 2013-08-23: qty 2

## 2013-08-23 MED ORDER — ONDANSETRON HCL 4 MG/2ML IJ SOLN: 4.0000 mg | Freq: Four times a day (QID) | INTRAMUSCULAR | Status: DC | PRN

## 2013-08-23 MED ORDER — SODIUM CHLORIDE 0.9 % IV SOLN: INTRAVENOUS | Status: DC

## 2013-08-23 MED ORDER — ACETAMINOPHEN 325 MG PO TABS
325.0000 mg | ORAL_TABLET | ORAL | Status: DC | PRN
  Administered 2013-08-23: 650 mg via ORAL
  Administered 2013-08-23: 16:00:00 325 mg via ORAL
  Filled 2013-08-23 (×2): qty 2

## 2013-08-23 MED ORDER — ISOSORBIDE MONONITRATE ER 30 MG PO TB24
30.0000 mg | ORAL_TABLET | Freq: Every day | ORAL | Status: DC
  Administered 2013-08-24: 10:00:00 30 mg via ORAL
  Filled 2013-08-23: qty 1

## 2013-08-23 MED ORDER — VANCOMYCIN HCL IN DEXTROSE 1-5 GM/200ML-% IV SOLN
1000.0000 mg | Freq: Two times a day (BID) | INTRAVENOUS | Status: AC
  Administered 2013-08-23: 21:00:00 1000 mg via INTRAVENOUS
  Filled 2013-08-23 (×2): qty 200

## 2013-08-23 MED ORDER — CHLORHEXIDINE GLUCONATE 4 % EX LIQD: 60.0000 mL | Freq: Once | CUTANEOUS | Status: DC

## 2013-08-23 MED ORDER — MIDAZOLAM HCL 5 MG/5ML IJ SOLN
INTRAMUSCULAR | Status: AC
  Filled 2013-08-23: qty 5

## 2013-08-23 MED ORDER — SODIUM CHLORIDE 0.9 % IV SOLN: INTRAVENOUS | Status: AC

## 2013-08-23 MED ORDER — POTASSIUM CHLORIDE CRYS ER 20 MEQ PO TBCR
20.0000 meq | EXTENDED_RELEASE_TABLET | Freq: Two times a day (BID) | ORAL | Status: DC
  Administered 2013-08-23 – 2013-08-24 (×2): 20 meq via ORAL
  Filled 2013-08-23 (×3): qty 1

## 2013-08-23 MED ORDER — HYDRALAZINE HCL 25 MG PO TABS
25.0000 mg | ORAL_TABLET | Freq: Three times a day (TID) | ORAL | Status: DC
  Administered 2013-08-23 – 2013-08-24 (×3): 25 mg via ORAL
  Filled 2013-08-23 (×5): qty 1

## 2013-08-23 MED ORDER — LISINOPRIL 5 MG PO TABS
5.0000 mg | ORAL_TABLET | Freq: Two times a day (BID) | ORAL | Status: DC
  Administered 2013-08-24: 5 mg via ORAL
  Filled 2013-08-23 (×2): qty 1

## 2013-08-23 MED ORDER — YOU HAVE A PACEMAKER BOOK
Freq: Once | Status: AC
  Administered 2013-08-23: 22:00:00
  Filled 2013-08-23: qty 1

## 2013-08-23 MED ORDER — ASPIRIN EC 81 MG PO TBEC
81.0000 mg | DELAYED_RELEASE_TABLET | Freq: Every day | ORAL | Status: DC
  Administered 2013-08-23 – 2013-08-24 (×2): 81 mg via ORAL
  Filled 2013-08-23 (×2): qty 1

## 2013-08-23 MED ORDER — TORSEMIDE 20 MG PO TABS
60.0000 mg | ORAL_TABLET | Freq: Two times a day (BID) | ORAL | Status: DC
  Administered 2013-08-23 – 2013-08-24 (×2): 60 mg via ORAL
  Filled 2013-08-23 (×4): qty 3

## 2013-08-23 NOTE — H&P (View-Only) (Signed)
ELECTROPHYSIOLOGY CONSULT NOTE  Patient ID: DRAXTON [name], MRN: [id number], DOB/AGE: [birth year] 39 y.o. Admit date: (Not on file) Date of Consult: 07/31/2013  Primary Physician: Pcp Not In System Primary Cardiologist:  DB/CHF  Chief Complaint:     [name] is a 37 y.o. male  Referred for consideration of an ICD.  He was initially diagnosed with a nonischemic cardiomyopathy in 2006. At that time catheterization demonstrated normal coronary arteries; his ejection fraction was 20-25% by 2013 his ejection fraction is recovered; however, March 2014 insulin to 15%. He was hospitalized and diuresed. Nonsustained ventricular tachycardia was noted and he was treated with amiodarone and a LifeVest.  Echocardiogram 8/14 demonstrated ejection fraction still in 25-30% range.  The patient denies chest pain,   He has modest shortness of breath with exertion  HTN still high Past Medical History  Diagnosis Date  . Hypertension   . Hyperlipidemia   . Systolic CHF, chronic   . Obesity   . Nonischemic cardiomyopathy     a.  echo 4/06: EF 30%, mild to mod MR, mild RAE, inf HK, lat HK , ant HK;    b.  cath 4/06: no CAD, EF 20-25%  . ED (erectile dysfunction)   . NSVT (nonsustained ventricular tachycardia)     eval by EP in past; no ICD candidate due to NYHA 1 symptoms  . Shortness of breath       Surgical History:  Past Surgical History  Procedure Laterality Date  . Cardiac catheterization  09/2011  . Multiple extractions with alveoloplasty N/A 01/26/2013    Procedure:  EXTRACION  TOOTH # 19 WITH ALVEOLOPLASTY;  Surgeon: Charlynne Pander, DDS;  Location: [facility] OR;  Service: Oral Surgery;  Laterality: N/A;     Home Meds: Prior to Admission medications   Medication Sig Start Date End Date Taking? Authorizing Provider  amiodarone (PACERONE) 200 MG tablet Take 1 tablet (200 mg total) by mouth daily. 03/31/13  Yes Dayna N Dunn, PA-C  aspirin EC 81 MG tablet Take 81 mg by mouth  daily.   Yes Historical Provider, MD  carvedilol (COREG) 6.25 MG tablet Take 1 tablet (6.25 mg total) by mouth 2 (two) times daily with a meal. 02/26/13  Yes Amy D Clegg, NP  hydrALAZINE (APRESOLINE) 25 MG tablet Take 1 tablet (25 mg total) by mouth 3 (three) times daily. 02/26/13  Yes Amy D Clegg, NP  isosorbide mononitrate (IMDUR) 30 MG 24 hr tablet Take 30 mg by mouth daily.   Yes Historical Provider, MD  lisinopril (PRINIVIL,ZESTRIL) 5 MG tablet Take 1 tablet (5 mg total) by mouth 2 (two) times daily. 03/06/13  Yes Hadassah Pais, PA-C  metolazone (ZAROXOLYN) 2.5 MG tablet Take 2.5 mg every Monday and Friday and as needed 05/03/13  Yes Dolores Patty, MD  potassium chloride SA (K-DUR,KLOR-CON) 20 MEQ tablet Take 1 tablet (20 mEq total) by mouth 2 (two) times daily. 03/16/13  Yes Amy D Clegg, NP  torsemide (DEMADEX) 20 MG tablet Take 3 tablets (60 mg total) by mouth 2 (two) times daily. 06/25/13  Yes Aundria Rud, NP       Allergies:  Allergies  Allergen Reactions  . Penicillins Other (See Comments)    Unknown childhood reaction     History   Social History  . Marital Status: Legally Separated    Spouse Name: N/A    Number of Children: 5  . Years of Education: N/A   Occupational History  .  unemployed    Social History Main Topics  . Smoking status: Current Every Day Smoker -- 0.30 packs/day for 4 years    Types: Cigarettes  . Smokeless tobacco: Never Used  . Alcohol Use: 3.6 oz/week    6 Cans of beer per week     Comment: 06/22/2012 "maybe 6 pk beer/wk"  . Drug Use: Yes    Special: Cocaine     Comment: 06/22/2012 "last cocaine ~ 6 months ago"  Last use of cocaine in October 2013  . Sexual Activity: Yes   Other Topics Concern  . Not on file   Social History Narrative  . No narrative on file     Family History  Problem Relation Age of Onset  . Coronary artery disease Mother 23    s/p PCI  . Lung cancer Father   . Diabetes type II Maternal Uncle   . Coronary artery  disease Maternal Uncle      ROS:  Please see the history of present illness.     All other systems reviewed and negative.    Physical Exam: Blood pressure 115/73, pulse 71, height 6\' 4"  (1.93 m), weight 328 lb (148.78 kg). General: Well developed, .obese   male in no acute distress. Head: Normocephalic, atraumatic, sclera non-icteric, no xanthomas, nares are without discharge. EENT: normal Lymph Nodes:  none Back: without scoliosis/kyphosis, no CVA tendersness Neck: Negative for carotid bruits. JVD could not see Lungs: Clear bilaterally to auscultation without wheezes, rales, or rhonchi. Breathing is unlabored. Heart: RRR with S1 S2. No murmur , rubs, or gallops appreciated. Abdomen: Soft, non-tender, non-distended with normoactive bowel sounds. No hepatomegaly. No rebound/guarding. No obvious abdominal masses. Msk:  Strength and tone appear normal for age. Extremities: No clubbing or cyanosis. No edema.  Distal pedal pulses are 2+ and equal bilaterally. Skin: Warm and Dry Neuro: Alert and oriented X 3. CN III-XII intact Grossly normal sensory and motor function . Psych:  Responds to questions appropriately with a normal affect.      Labs: Cardiac Enzymes No results found for this basename: CKTOTAL, CKMB, TROPONINI,  in the last 72 hours CBC Lab Results  Component Value Date   WBC 7.1 04/27/2013   HGB 15.6 04/27/2013   HCT 42.8 04/27/2013   MCV 77.7* 04/27/2013   PLT 213 04/27/2013   PROTIME: No results found for this basename: LABPROT, INR,  in the last 72 hours Chemistry No results found for this basename: NA, K, CL, CO2, BUN, CREATININE, CALCIUM, LABALBU, PROT, BILITOT, ALKPHOS, ALT, AST, GLUCOSE,  in the last 168 hours Lipids Lab Results  Component Value Date   CHOL 169 06/23/2012   HDL 47 06/23/2012   LDLCALC 098* 06/23/2012   TRIG 97 06/23/2012   BNP Pro B Natriuretic peptide (BNP)  Date/Time Value Range Status  03/13/2013  9:43 AM 1914.0* 0 - 125 pg/mL Final  02/20/2013   2:50 PM 2002.0* 0 - 125 pg/mL Final  01/24/2013 11:02 AM 3126.0* 0 - 125 pg/mL Final  01/22/2013  5:12 AM 1853.0* 0 - 125 pg/mL Final   Miscellaneous Lab Results  Component Value Date   DDIMER 0.49* 06/17/2011    Radiology/Studies:  No results found.  JXB:JYNWG rhythm at 71 with intervals 20/10/42 Axis XXXVII   Assessment and Plan: *  Nonischemic cardiomyopathy  Persisting ischemic cardiomyopathy despite guidelines directed medical therapy. It is appropriate to consider ICD implantation for sudden death prevention. His overall risks are increased by his ventricular tachycardia; however, these do not  track is particularly closely arrhythmic death. He is on amiodarone; we will plan to discontinue it after she wants of monitoring supposed to assess his burden of nonsustained ventricular tachycardia  Have reviewed the potential benefits and risks of ICD implantation including but not limited to death, perforation of heart or lung, lead dislodgement, infection,  device malfunction and inappropriate shocks.  The patient express understanding  and are willing to proceed.      Congestive heart failure-chronic-systolic: He is euvolemic. We'll continue his current medications in conjunction apart their clinic   Disrupted nocturnal breathing: Within increase in weight, he again has symptoms of sleep apnea. Sleep testing is appropriate   Morbid obesity: We spent number of minutes reviewing the importance of exercise and strategies whereby he may obtain some time to do that      [employer]

## 2013-08-23 NOTE — Interval H&P Note (Signed)
ICD Criteria  Current LVEF (within 6 months):25%  NYHA Functional Classification: Class II  Heart Failure History:  Yes, Duration of heart failure since onset is > 9 months  Non-Ischemic Dilated Cardiomyopathy History:  Yes, timeframe is > 9 months  Atrial Fibrillation/Atrial Flutter:  No.  Ventricular Tachycardia History:  No.  Cardiac Arrest History:  No  History of Syndromes with Risk of Sudden Death:  No.  Previous ICD:  No.  Electrophysiology Study: No.  Anticoagulation Therapy:  Patient is NOT on anticoagulation therapy.   Beta Blocker Therapy:  Yes.   Ace Inhibitor/ARB Therapy:  YES  History and Physical Interval Note:  08/23/2013 7:47 AM  Douglas [name]  has presented today for surgery, with the diagnosis of Heart failure  The various methods of treatment have been discussed with the patient and family. After consideration of risks, benefits and other options for treatment, the patient has consented to  Procedure(s): IMPLANTABLE CARDIOVERTER DEFIBRILLATOR IMPLANT (N/A) as a surgical intervention .  The patient's history has been reviewed, patient examined, no change in status, stable for surgery.  I have reviewed the patient's chart and labs.  Questions were answered to the patient's satisfaction.     [name]

## 2013-08-23 NOTE — CV Procedure (Signed)
ARCH METHOT [id number]  [id number]  Preop Dx: nonischemic cardiomyopathy  VT-NS  Postop Dx same/   Procedure: single chamber ICD implantation with out  intraoperative defibrillation threshold testing; venography  Following the obtaining of informed consent the patient was brought to the electrophysiology laboratory in place of the fluoroscopic table in the supine position. Venogram done because of size and anatomical distortion  After routine prep and drape, lidocaine was infiltrated in the prepectoral subclavicular region and an incision was made and carried down to the layer of the prepectoral fascia using electrocautery and sharp dissection. A pocket was formed similarly.  Thereafter an attention was turned to gain access to the extrathoracic left subclavian vein which was accomplished without  difficulty and without the aspiration of air or puncture of the artery. A 9 French sheath was placed for which was then passed a   St Jude dual coil  active fixation defibrillator lead, model 7121 serial number [id number]. It was  passed under fluoroscopic guidance to the right ventricular apex. In its location the bipolar R wave was 7 millivolts, impedance was 627 ohms, the pacing threshold was 0.8 volts at 0.5 msec. Current at threshold was 1.2 mA.  There was no diaphragmatic pacing at 10 V. The current of injury was brisk0.  The lead was secured to the prepectoral fascia and then attached to a  Medtronic  ICD, serial number  [id number].  Through the device, the bipolar R wave was 12.5 millivolts, impedance was 570 ohms, the pacing threshold was 0.25 volts at 0.3 msec. High-voltage impedance of 45/57 ohms.  The pocket was copiously irrigated with antibiotic containing saline solution. Hemostasis was assured, and the device and the lead was placed in the pocket and secured to the prepectoral fascia.  The wound is closed in 3 layers in normal fashion. The wound is washed dried and a benzoin  Steri-Strips dressing was then applied. Needle counts sponge counts and instrument counts were correct at the end of the procedure according to the staff.      Patient tolerated the procedure without apparent complication      Cx: None     [name], MD 08/23/2013 11:29 AM

## 2013-08-23 NOTE — Discharge Summary (Signed)
ELECTROPHYSIOLOGY DISCHARGE SUMMARY    Patient ID: Douglas [name],  MRN: [id number], DOB/AGE: 1976-08-27 37 y.o.  Admit date: 08/23/2013 Discharge date: 08/23/2013  Primary Care Physician: Not in EPIC Primary Cardiologist / EP: [name], MD / Graciela Husbands, MD  Primary Discharge Diagnosis:  1. NICM, EF 25-30%, s/p ICD implant  2. NSVT  Secondary Discharge Diagnoses:  1. Chronic systolic HF 2. HTN 3. Dyslipidemia  Procedures This Admission:  1. Single chamber ICD implantation  RV lead - St Jude dual coil active fixation defibrillator lead, model 7121 serial number [id number] Device - Medtronic ICD serial number [id number]  History and Hospital Course:  [name] is a 37 year old man with a NICM, EF 25-30%, who has persistent LV dysfunction despite optimal medical therapy and presented yesterday for ICD implantation for primary prevention of SCD. [name] tolerated this procedure well without any immediate complication. He remains hemodynamically stable and afebrile. His chest xray shows stable lead placement without pneumothorax. His device interrogation shows normal ICD function with stable lead parameters/measurements. His implant site is intact without significant bleeding or hematoma. He has been given discharge instructions including wound care and activity restrictions. He will follow-up in 10 days for wound check. There were no changes made to his medications. He has been seen, examined and deemed stable for discharge today by Dr. Berton Mount.  Physical Exam:  Vitals: Blood pressure 113/47, pulse 63, temperature 97.4 F (36.3 C), temperature source Oral, resp. rate 18, height 6\' 4"  (1.93 m), weight 330 lb (149.687 kg), SpO2 99.00%.  General: Well developed, well appearing 37 year old male in no acute distress.  Neck: Supple. JVD not elevated.  Lungs: Clear bilaterally to auscultation without wheezes, rales, or rhonchi. Breathing is unlabored.  Heart: RRR S1 S2 without  murmurs, rubs, or gallops.  Abdomen: Soft, non-distended.  Extremities: No clubbing or cyanosis. No edema. Distal pedal pulses are 2+ and equal bilaterally.  Neuro: Alert and oriented X 3. Moves all extremities spontaneously. No focal deficits.  Skin: Implant site is intact without bleeding or hematoma.  Labs: Lab Results  Component Value Date   WBC 7.6 08/20/2013   HGB 14.1 08/20/2013   HCT 40.8 08/20/2013   MCV 89.3 08/20/2013   PLT 236.0 08/20/2013     Recent Labs Lab 08/20/13 1430  NA 132*  K 3.6  CL 96  CO2 28  BUN 24*  CREATININE 1.9*  CALCIUM 9.0  PROT 7.7  BILITOT 0.5  ALKPHOS 65  ALT 40  AST 31  GLUCOSE 120*    Disposition:  The patient is being discharged in stable condition.  Follow-up:     Follow-up Information   Follow up with [facility] On 09/03/2013. (At 12:30 PM for wound check)    Specialty:  Cardiology   Contact information:   [address] [place] [address] [phone]      Follow up with [name], MD In 3 months. (Our office will mail letter with appointment date and time)    Specialty:  Cardiology   Contact information:   [address] [place] [address] [phone]      Discharge Medications:    Medication List    ASK your doctor about these medications       amiodarone 200 MG tablet  Commonly known as:  PACERONE     aspirin EC 81 MG tablet  Take 81 mg by mouth daily.  carvedilol 6.25 MG tablet  Commonly known as:  COREG  Take 6.25 mg by mouth 2 (two) times daily with a meal. Take 6.25 in am Take 12.5 mg inp pm     hydrALAZINE 25 MG tablet  Commonly known as:  APRESOLINE  Take 1 tablet (25 mg total) by mouth 3 (three) times daily.     isosorbide mononitrate 30 MG 24 hr tablet  Commonly known as:  IMDUR  Take 30 mg by mouth daily.     lisinopril 5 MG tablet  Commonly known as:  PRINIVIL,ZESTRIL  Take 1 tablet (5 mg total) by mouth 2 (two) times  daily.     metolazone 2.5 MG tablet  Commonly known as:  ZAROXOLYN  Take 1 tablet (2.5 mg total) by mouth as needed.     potassium chloride SA 20 MEQ tablet  Commonly known as:  K-DUR,KLOR-CON  Take 1 tablet (20 mEq total) by mouth 2 (two) times daily.     torsemide 20 MG tablet  Commonly known as:  DEMADEX  Take 60 mg by mouth 2 (two) times daily.       Duration of Discharge Encounter: Greater than 30 minutes including physician time.  Signed, Rick Duff, PA-C 08/23/2013, 4:59 PM   [name]

## 2013-08-24 ENCOUNTER — Ambulatory Visit (HOSPITAL_COMMUNITY): Payer: Medicaid Other

## 2013-08-24 DIAGNOSIS — I5022 Chronic systolic (congestive) heart failure: Secondary | ICD-10-CM

## 2013-08-29 ENCOUNTER — Ambulatory Visit (HOSPITAL_BASED_OUTPATIENT_CLINIC_OR_DEPARTMENT_OTHER): Payer: Medicaid Other | Attending: Internal Medicine

## 2013-08-29 VITALS — Ht 76.0 in | Wt 330.0 lb

## 2013-08-29 DIAGNOSIS — I5022 Chronic systolic (congestive) heart failure: Secondary | ICD-10-CM

## 2013-08-29 DIAGNOSIS — G4710 Hypersomnia, unspecified: Secondary | ICD-10-CM | POA: Insufficient documentation

## 2013-08-29 DIAGNOSIS — G4733 Obstructive sleep apnea (adult) (pediatric): Secondary | ICD-10-CM

## 2013-09-03 ENCOUNTER — Encounter (INDEPENDENT_AMBULATORY_CARE_PROVIDER_SITE_OTHER): Payer: Self-pay

## 2013-09-03 ENCOUNTER — Ambulatory Visit (INDEPENDENT_AMBULATORY_CARE_PROVIDER_SITE_OTHER): Payer: Medicaid Other | Admitting: *Deleted

## 2013-09-03 DIAGNOSIS — I428 Other cardiomyopathies: Secondary | ICD-10-CM

## 2013-09-03 LAB — ICD DEVICE OBSERVATION
RV LEAD AMPLITUDE: 11.3 mv
RV LEAD IMPEDENCE ICD: 494 Ohm
RV LEAD THRESHOLD: 0.5 V

## 2013-09-03 NOTE — Progress Notes (Signed)
Wound check appointment. Steri-strips removed. Wound without redness or edema. Incision edges approximated, wound well healed. Normal device function. Thresholds, sensing, and impedances consistent with implant measurements. Device programmed at 3.5V for extra safety margin until 3 month visit. Histogram distribution appropriate for patient and level of activity. No mode switches or ventricular arrhythmias noted. Patient educated about wound care, arm mobility, lifting restrictions, shock plan. ROV in 3 months with SK. °

## 2013-09-06 DIAGNOSIS — I5022 Chronic systolic (congestive) heart failure: Secondary | ICD-10-CM

## 2013-09-06 DIAGNOSIS — G4733 Obstructive sleep apnea (adult) (pediatric): Secondary | ICD-10-CM

## 2013-09-07 NOTE — Procedures (Signed)
NAMEVIAN, Douglas [name] NO.:  [id number]  MEDICAL RECORD NO.:  [id number]          PATIENT TYPE:  OUT  LOCATION:  SLEEP CENTER                 FACILITY:  [facility]  PHYSICIAN:  Barbaraann Share, MD,FCCPDATE OF BIRTH:  1974/04/07  DATE OF STUDY:  08/29/2013                           NOCTURNAL POLYSOMNOGRAM  REFERRING PHYSICIAN:  Duke Salvia, MD, [facility]  LOCATION:  Sleep lab.  RECORDING PHYSICIAN:  Duke Salvia, MD, [facility]  INDICATION FOR STUDY:  Hypersomnia with sleep apnea.  EPWORTH SLEEPINESS SCORE:  16.  SLEEP ARCHITECTURE:  The patient had a total sleep time of 238 minutes with no slow-wave sleep and 51 minutes of REM.  Sleep onset latency was normal at 22 minutes and REM onset was normal at 60 minutes.  Sleep efficiency was poor at 66%.  RESPIRATORY DATA:  The patient was found to have no obstructive apneas and only 13 obstructive hypopneas, giving him an apnea-hypopnea index of 3 events per hour.  The events occurred in all body positions, but were increased during REM.  There was loud snoring noted throughout.  OXYGEN DATA:  There was O2 desaturation as low as 81% with the patient's obstructive events.  However, the patient only had 42 seconds the entire night less than 88% saturation.  CARDIAC DATA:  Occasional PVC noted, but no clinically significant arrhythmias were noted.  MOVEMENTS/PARASOMNIA:  The patient had small numbers of leg jerks without significant sleep disruption.  There were no abnormal behaviors seen.  IMPRESSION/RECOMMENDATION: 1. Small numbers of obstructive events, which do not meet the AHI     criteria for the obstructive sleep apnea syndrome.  The patient     therefore did not meet split night protocol.  He was noted to have     loud snoring throughout and should be encouraged to work     aggressively on weight loss. 2. Oxygen desaturation as low as 81% transiently during the night,     however, the patient only spent 42  seconds     the entire night less than 88%.  This does not meet the criteria     for nocturnal oxygen. 3. Occasional PVC noted, but no clinically significant arrhythmias     were seen.     Barbaraann Share, MD,FCCP Diplomate, American Board of Sleep Medicine    [facility]  D:  09/06/2013 17:04:38  T:  09/07/2013 02:15:26  Job:  [id number]

## 2013-09-20 ENCOUNTER — Encounter: Payer: Self-pay | Admitting: Pharmacist

## 2013-09-24 ENCOUNTER — Telehealth: Payer: Self-pay | Admitting: Internal Medicine

## 2013-09-24 NOTE — Telephone Encounter (Signed)
lmtcb

## 2013-09-24 NOTE — Telephone Encounter (Signed)
New message ° ° °Patient is returning your call. Please call. °

## 2013-09-28 NOTE — Telephone Encounter (Signed)
lmtcb - to let him know sleep study results normal per Dr. Graciela Husbands.

## 2013-10-02 NOTE — Telephone Encounter (Signed)
The patient is aware of his sleep study results.

## 2013-10-12 ENCOUNTER — Other Ambulatory Visit (HOSPITAL_COMMUNITY): Payer: Self-pay | Admitting: Anesthesiology

## 2013-10-12 DIAGNOSIS — J9600 Acute respiratory failure, unspecified whether with hypoxia or hypercapnia: Secondary | ICD-10-CM

## 2013-10-13 ENCOUNTER — Encounter (HOSPITAL_COMMUNITY): Payer: Self-pay | Admitting: *Deleted

## 2013-10-13 ENCOUNTER — Emergency Department (HOSPITAL_COMMUNITY): Payer: Medicaid Other

## 2013-10-13 ENCOUNTER — Inpatient Hospital Stay (HOSPITAL_COMMUNITY): Payer: Medicaid Other

## 2013-10-13 ENCOUNTER — Inpatient Hospital Stay (HOSPITAL_COMMUNITY)
Admission: EM | Admit: 2013-10-13 | Discharge: 2013-10-15 | DRG: 208 | Disposition: A | Discharge status: Home / Self Care | Payer: Medicaid Other | Attending: Pulmonary Disease | Admitting: Pulmonary Disease

## 2013-10-13 DIAGNOSIS — N529 Male erectile dysfunction, unspecified: Secondary | ICD-10-CM

## 2013-10-13 DIAGNOSIS — Z79899 Other long term (current) drug therapy: Secondary | ICD-10-CM

## 2013-10-13 DIAGNOSIS — I428 Other cardiomyopathies: Secondary | ICD-10-CM

## 2013-10-13 DIAGNOSIS — I5023 Acute on chronic systolic (congestive) heart failure: Secondary | ICD-10-CM

## 2013-10-13 DIAGNOSIS — S0003XA Contusion of scalp, initial encounter: Secondary | ICD-10-CM | POA: Diagnosis present

## 2013-10-13 DIAGNOSIS — R561 Post traumatic seizures: Secondary | ICD-10-CM | POA: Diagnosis present

## 2013-10-13 DIAGNOSIS — R109 Unspecified abdominal pain: Secondary | ICD-10-CM

## 2013-10-13 DIAGNOSIS — I472 Ventricular tachycardia, unspecified: Secondary | ICD-10-CM

## 2013-10-13 DIAGNOSIS — I5022 Chronic systolic (congestive) heart failure: Secondary | ICD-10-CM

## 2013-10-13 DIAGNOSIS — S022XXA Fracture of nasal bones, initial encounter for closed fracture: Secondary | ICD-10-CM

## 2013-10-13 DIAGNOSIS — S0085XA Superficial foreign body of other part of head, initial encounter: Secondary | ICD-10-CM | POA: Diagnosis present

## 2013-10-13 DIAGNOSIS — E872 Acidosis, unspecified: Secondary | ICD-10-CM

## 2013-10-13 DIAGNOSIS — R569 Unspecified convulsions: Secondary | ICD-10-CM

## 2013-10-13 DIAGNOSIS — N179 Acute kidney failure, unspecified: Secondary | ICD-10-CM | POA: Diagnosis present

## 2013-10-13 DIAGNOSIS — I509 Heart failure, unspecified: Secondary | ICD-10-CM

## 2013-10-13 DIAGNOSIS — Y92009 Unspecified place in unspecified non-institutional (private) residence as the place of occurrence of the external cause: Secondary | ICD-10-CM

## 2013-10-13 DIAGNOSIS — F17200 Nicotine dependence, unspecified, uncomplicated: Secondary | ICD-10-CM

## 2013-10-13 DIAGNOSIS — E119 Type 2 diabetes mellitus without complications: Secondary | ICD-10-CM

## 2013-10-13 DIAGNOSIS — I959 Hypotension, unspecified: Secondary | ICD-10-CM | POA: Diagnosis present

## 2013-10-13 DIAGNOSIS — S0280XA Fracture of other specified skull and facial bones, unspecified side, initial encounter for closed fracture: Secondary | ICD-10-CM

## 2013-10-13 DIAGNOSIS — E876 Hypokalemia: Secondary | ICD-10-CM

## 2013-10-13 DIAGNOSIS — E669 Obesity, unspecified: Secondary | ICD-10-CM | POA: Diagnosis present

## 2013-10-13 DIAGNOSIS — Z6841 Body Mass Index (BMI) 40.0 and over, adult: Secondary | ICD-10-CM

## 2013-10-13 DIAGNOSIS — E860 Dehydration: Secondary | ICD-10-CM | POA: Diagnosis present

## 2013-10-13 DIAGNOSIS — E785 Hyperlipidemia, unspecified: Secondary | ICD-10-CM

## 2013-10-13 DIAGNOSIS — F1010 Alcohol abuse, uncomplicated: Secondary | ICD-10-CM | POA: Diagnosis present

## 2013-10-13 DIAGNOSIS — Y998 Other external cause status: Secondary | ICD-10-CM

## 2013-10-13 DIAGNOSIS — S0005XA Superficial foreign body of scalp, initial encounter: Secondary | ICD-10-CM | POA: Diagnosis present

## 2013-10-13 DIAGNOSIS — J9690 Respiratory failure, unspecified, unspecified whether with hypoxia or hypercapnia: Secondary | ICD-10-CM

## 2013-10-13 DIAGNOSIS — I5033 Acute on chronic diastolic (congestive) heart failure: Secondary | ICD-10-CM

## 2013-10-13 DIAGNOSIS — F1490 Cocaine use, unspecified, uncomplicated: Secondary | ICD-10-CM

## 2013-10-13 DIAGNOSIS — J9600 Acute respiratory failure, unspecified whether with hypoxia or hypercapnia: Principal | ICD-10-CM

## 2013-10-13 DIAGNOSIS — I10 Essential (primary) hypertension: Secondary | ICD-10-CM

## 2013-10-13 DIAGNOSIS — Z95810 Presence of automatic (implantable) cardiac defibrillator: Secondary | ICD-10-CM

## 2013-10-13 DIAGNOSIS — F1410 Cocaine abuse, uncomplicated: Secondary | ICD-10-CM | POA: Diagnosis present

## 2013-10-13 LAB — POCT I-STAT 3, ART BLOOD GAS (G3+)
Bicarbonate: 22.6 mEq/L (ref 20.0–24.0)
O2 Saturation: 100 %
TCO2: 24 mmol/L (ref 0–100)
pCO2 arterial: 47.9 mmHg — ABNORMAL HIGH (ref 35.0–45.0)
pO2, Arterial: 580 mmHg — ABNORMAL HIGH (ref 80.0–100.0)

## 2013-10-13 LAB — URINALYSIS, ROUTINE W REFLEX MICROSCOPIC
Glucose, UA: NEGATIVE mg/dL
Leukocytes, UA: NEGATIVE
Protein, ur: 100 mg/dL — AB
Specific Gravity, Urine: 1.01 (ref 1.005–1.030)
pH: 5.5 (ref 5.0–8.0)

## 2013-10-13 LAB — COMPREHENSIVE METABOLIC PANEL
ALT: 38 U/L (ref 0–53)
ALT: 29 U/L (ref 0–53)
AST: 41 U/L — ABNORMAL HIGH (ref 0–37)
Albumin: 3.9 g/dL (ref 3.5–5.2)
Alkaline Phosphatase: 91 U/L (ref 39–117)
CO2: 23 mEq/L (ref 19–32)
Calcium: 9.3 mg/dL (ref 8.4–10.5)
Calcium: 8.4 mg/dL (ref 8.4–10.5)
Chloride: 106 mEq/L (ref 96–112)
Creatinine, Ser: 1.38 mg/dL — ABNORMAL HIGH (ref 0.50–1.35)
Creatinine, Ser: 1.06 mg/dL (ref 0.50–1.35)
GFR calc Af Amer: >90 mL/min (ref >90)
GFR calc non Af Amer: 87 mL/min — ABNORMAL LOW (ref >90)
Glucose, Bld: 97 mg/dL (ref 70–99)
Potassium: 3 mEq/L — ABNORMAL LOW (ref 3.5–5.1)
Total Bilirubin: 0.4 mg/dL (ref 0.3–1.2)
Total Protein: 8.2 g/dL (ref 6.0–8.3)

## 2013-10-13 LAB — CBC
HCT: 43.1 % (ref 39.0–52.0)
MCH: 30.4 pg (ref 26.0–34.0)
MCV: 85 fL (ref 78.0–100.0)
Platelets: 229 10*3/uL (ref 150–400)
RDW: 12.9 % (ref 11.5–15.5)
WBC: 7.7 10*3/uL (ref 4.0–10.5)

## 2013-10-13 LAB — POCT I-STAT, CHEM 8
BUN: 17 mg/dL (ref 6–23)
Calcium, Ion: 1.09 mmol/L — ABNORMAL LOW (ref 1.12–1.23)
Creatinine, Ser: 1.7 mg/dL — ABNORMAL HIGH (ref 0.50–1.35)
Glucose, Bld: 150 mg/dL — ABNORMAL HIGH (ref 70–99)
HCT: 48 % (ref 39.0–52.0)
Hemoglobin: 16.3 g/dL (ref 13.0–17.0)
Potassium: 2.9 mEq/L — ABNORMAL LOW (ref 3.5–5.1)
Sodium: 136 mEq/L (ref 135–145)

## 2013-10-13 LAB — URINE MICROSCOPIC-ADD ON

## 2013-10-13 LAB — RAPID URINE DRUG SCREEN, HOSP PERFORMED
Amphetamines: NOT DETECTED
Benzodiazepines: NOT DETECTED
Cocaine: POSITIVE — AB
Opiates: NOT DETECTED

## 2013-10-13 LAB — TROPONIN I: Troponin I: <0.3 ng/mL (ref <0.30)

## 2013-10-13 MED ORDER — SODIUM CHLORIDE 0.9 % IV BOLUS (SEPSIS)
1000.0000 mL | Freq: Once | INTRAVENOUS | Status: AC
  Administered 2013-10-13: 1000 mL via INTRAVENOUS

## 2013-10-13 MED ORDER — LIDOCAINE HCL (CARDIAC) 20 MG/ML IV SOLN: 100.0000 mg | Freq: Once | INTRAVENOUS | Status: DC

## 2013-10-13 MED ORDER — HEPARIN SODIUM (PORCINE) 5000 UNIT/ML IJ SOLN
5000.0000 [IU] | Freq: Three times a day (TID) | INTRAMUSCULAR | Status: DC
  Administered 2013-10-13 – 2013-10-14 (×4): 5000 [IU] via SUBCUTANEOUS
  Filled 2013-10-13 (×7): qty 1

## 2013-10-13 MED ORDER — ROCURONIUM BROMIDE 50 MG/5ML IV SOLN
100.0000 mg | Freq: Once | INTRAVENOUS | Status: AC
  Administered 2013-10-13: 100 mg via INTRAVENOUS

## 2013-10-13 MED ORDER — ETOMIDATE 2 MG/ML IV SOLN: 0.3000 mg/kg | Freq: Once | INTRAVENOUS | Status: DC

## 2013-10-13 MED ORDER — MIDAZOLAM HCL 2 MG/2ML IJ SOLN: 2.0000 mg | Freq: Once | INTRAMUSCULAR | Status: AC

## 2013-10-13 MED ORDER — FENTANYL CITRATE 0.05 MG/ML IJ SOLN: 100.0000 ug | Freq: Once | INTRAMUSCULAR | Status: AC

## 2013-10-13 MED ORDER — BIOTENE DRY MOUTH MT LIQD
15.0000 mL | Freq: Four times a day (QID) | OROMUCOSAL | Status: DC
  Administered 2013-10-13 – 2013-10-14 (×6): 15 mL via OROMUCOSAL

## 2013-10-13 MED ORDER — POTASSIUM CHLORIDE 20 MEQ/15ML (10%) PO LIQD
20.0000 meq | Freq: Once | ORAL | Status: AC
  Administered 2013-10-13: 20 meq via ORAL
  Filled 2013-10-13: qty 15

## 2013-10-13 MED ORDER — PROPOFOL 10 MG/ML IV EMUL
5.0000 ug/kg/min | INTRAVENOUS | Status: DC
Start: 2013-10-13 — End: 2013-10-15
  Administered 2013-10-13: 55 ug/kg/min via INTRAVENOUS
  Administered 2013-10-13 (×2): 45 ug/kg/min via INTRAVENOUS
  Administered 2013-10-13 (×3): 55 ug/kg/min via INTRAVENOUS
  Administered 2013-10-13: 45 ug/kg/min via INTRAVENOUS
  Administered 2013-10-13: 55 ug/kg/min via INTRAVENOUS
  Administered 2013-10-14: 40 ug/kg/min via INTRAVENOUS
  Administered 2013-10-14: 30 ug/kg/min via INTRAVENOUS
  Administered 2013-10-14: 45 ug/kg/min via INTRAVENOUS
  Filled 2013-10-13 (×11): qty 100

## 2013-10-13 MED ORDER — SODIUM CHLORIDE 0.9 % IV SOLN: 250.0000 mL | INTRAVENOUS | Status: DC | PRN

## 2013-10-13 MED ORDER — SODIUM CHLORIDE 0.9 % IV SOLN
500.0000 mg | Freq: Two times a day (BID) | INTRAVENOUS | Status: DC
  Administered 2013-10-13 – 2013-10-15 (×4): 500 mg via INTRAVENOUS
  Filled 2013-10-13 (×5): qty 5

## 2013-10-13 MED ORDER — ETOMIDATE 2 MG/ML IV SOLN
20.0000 mg | Freq: Once | INTRAVENOUS | Status: AC
  Administered 2013-10-13: 20 mg via INTRAVENOUS

## 2013-10-13 MED ORDER — PROPOFOL 10 MG/ML IV EMUL
INTRAVENOUS | Status: AC
  Filled 2013-10-13: qty 100

## 2013-10-13 MED ORDER — LIDOCAINE HCL (CARDIAC) 20 MG/ML IV SOLN
100.0000 mg | Freq: Every morning | INTRAVENOUS | Status: DC
  Administered 2013-10-13: 100 mg via INTRAVENOUS

## 2013-10-13 MED ORDER — POTASSIUM CHLORIDE 10 MEQ/100ML IV SOLN
10.0000 meq | Freq: Once | INTRAVENOUS | Status: AC
  Administered 2013-10-13: 10 meq via INTRAVENOUS
  Filled 2013-10-13: qty 100

## 2013-10-13 MED ORDER — SODIUM CHLORIDE 0.9 % IV SOLN
INTRAVENOUS | Status: DC
  Administered 2013-10-13: 06:00:00 via INTRAVENOUS

## 2013-10-13 MED ORDER — FENTANYL CITRATE 0.05 MG/ML IJ SOLN
INTRAMUSCULAR | Status: AC
  Filled 2013-10-13: qty 2

## 2013-10-13 MED ORDER — PROPOFOL 10 MG/ML IV EMUL
5.0000 ug/kg/min | INTRAVENOUS | Status: DC
  Filled 2013-10-13: qty 100

## 2013-10-13 MED ORDER — FENTANYL CITRATE 0.05 MG/ML IJ SOLN: 50.0000 ug | INTRAMUSCULAR | Status: DC | PRN

## 2013-10-13 MED ORDER — POTASSIUM CHLORIDE IN NACL 40-0.9 MEQ/L-% IV SOLN
INTRAVENOUS | Status: DC
  Administered 2013-10-13 – 2013-10-14 (×2): 75 mL/h via INTRAVENOUS
  Administered 2013-10-15: 04:00:00 via INTRAVENOUS
  Filled 2013-10-13 (×7): qty 1000

## 2013-10-13 MED ORDER — POTASSIUM CHLORIDE CRYS ER 20 MEQ PO TBCR: 40.0000 meq | EXTENDED_RELEASE_TABLET | Freq: Once | ORAL | Status: DC

## 2013-10-13 MED ORDER — SUCCINYLCHOLINE CHLORIDE 20 MG/ML IJ SOLN
150.0000 mg | Freq: Once | INTRAMUSCULAR | Status: AC
  Administered 2013-10-13: 150 mg via INTRAVENOUS

## 2013-10-13 MED ORDER — MIDAZOLAM HCL 2 MG/2ML IJ SOLN
INTRAMUSCULAR | Status: AC
  Filled 2013-10-13: qty 2

## 2013-10-13 MED ORDER — POTASSIUM CHLORIDE 20 MEQ/15ML (10%) PO LIQD
ORAL | Status: AC
  Administered 2013-10-13: 40 meq via ORAL
  Filled 2013-10-13: qty 30

## 2013-10-13 MED ORDER — POTASSIUM CHLORIDE 20 MEQ/15ML (10%) PO LIQD
40.0000 meq | Freq: Once | ORAL | Status: AC
  Administered 2013-10-13: 40 meq via ORAL
  Filled 2013-10-13: qty 30

## 2013-10-13 MED ORDER — SODIUM CHLORIDE 0.9 % IV SOLN
1500.0000 mg | INTRAVENOUS | Status: AC
  Administered 2013-10-13: 1500 mg via INTRAVENOUS
  Filled 2013-10-13: qty 15

## 2013-10-13 MED ORDER — CHLORHEXIDINE GLUCONATE 0.12 % MT SOLN
15.0000 mL | Freq: Two times a day (BID) | OROMUCOSAL | Status: DC
  Administered 2013-10-13 – 2013-10-14 (×3): 15 mL via OROMUCOSAL
  Filled 2013-10-13 (×3): qty 15

## 2013-10-13 NOTE — ED Notes (Signed)
Pt. Fell on tile floor tonite, hitting face. Pt. Drinking tonite. Had a pacemaker placed recently. No hx. Of seizures. cbg 172. Pt. Very confused.

## 2013-10-13 NOTE — Progress Notes (Signed)
ET tube advanced to 27cm @ the lip.
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
## 2013-10-13 NOTE — ED Notes (Signed)
Dr Dierdre Highman notified of lactic acid values 8.71

## 2013-10-13 NOTE — ED Notes (Signed)
Dr. Dierdre Highman aware of low bp. Continue to push fluids. Placed pt. In trendelenburg.

## 2013-10-13 NOTE — Progress Notes (Signed)
EEG Completed; Results Pending  

## 2013-10-13 NOTE — Progress Notes (Signed)
Arrived on unit @ [phone]. CHG bath completed and MRSA PCR completed and sent to lab.
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
## 2013-10-13 NOTE — Progress Notes (Signed)
Utilization review completed.  P.J. Leith Hedlund,RN,BSN Case Manager [phone]  

## 2013-10-13 NOTE — ED Notes (Signed)
Family at bedside. 
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
## 2013-10-13 NOTE — ED Notes (Signed)
Respiratory needed for transport.

## 2013-10-13 NOTE — Progress Notes (Signed)
Respiratory therapy note- received patient from ED with unknown ETT placement. Currently ETT is a 7.5 at 27 at the lip.
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
## 2013-10-14 ENCOUNTER — Inpatient Hospital Stay (HOSPITAL_COMMUNITY): Payer: Medicaid Other

## 2013-10-14 DIAGNOSIS — E119 Type 2 diabetes mellitus without complications: Secondary | ICD-10-CM

## 2013-10-14 DIAGNOSIS — E872 Acidosis: Secondary | ICD-10-CM

## 2013-10-14 DIAGNOSIS — J95821 Acute postprocedural respiratory failure: Secondary | ICD-10-CM

## 2013-10-14 DIAGNOSIS — J9600 Acute respiratory failure, unspecified whether with hypoxia or hypercapnia: Principal | ICD-10-CM

## 2013-10-14 DIAGNOSIS — F1410 Cocaine abuse, uncomplicated: Secondary | ICD-10-CM

## 2013-10-14 DIAGNOSIS — I5033 Acute on chronic diastolic (congestive) heart failure: Secondary | ICD-10-CM

## 2013-10-14 LAB — CBC
MCH: 30.6 pg (ref 26.0–34.0)
MCV: 85.5 fL (ref 78.0–100.0)
Platelets: 171 10*3/uL (ref 150–400)
RBC: 4.34 MIL/uL (ref 4.22–5.81)
RDW: 12.9 % (ref 11.5–15.5)
WBC: 6.2 10*3/uL (ref 4.0–10.5)

## 2013-10-14 LAB — PHOSPHORUS: Phosphorus: 2.1 mg/dL — ABNORMAL LOW (ref 2.3–4.6)

## 2013-10-14 LAB — BASIC METABOLIC PANEL
CO2: 22 mEq/L (ref 19–32)
Calcium: 8.5 mg/dL (ref 8.4–10.5)
Creatinine, Ser: 1.09 mg/dL (ref 0.50–1.35)
GFR calc Af Amer: >90 mL/min (ref >90)
Glucose, Bld: 92 mg/dL (ref 70–99)
Sodium: 140 mEq/L (ref 135–145)

## 2013-10-14 LAB — BLOOD GAS, ARTERIAL
Acid-base deficit: 1.3 mmol/L (ref 0.0–2.0)
Drawn by: 34779
FIO2: 0.4 %
PEEP: 5 cmH2O
RATE: 20 resp/min
TCO2: 22.6 mmol/L (ref 0–100)
pCO2 arterial: 29.4 mmHg — ABNORMAL LOW (ref 35.0–45.0)
pH, Arterial: 7.481 — ABNORMAL HIGH (ref 7.350–7.450)
pO2, Arterial: 117 mmHg — ABNORMAL HIGH (ref 80.0–100.0)

## 2013-10-14 LAB — MAGNESIUM: Magnesium: 2.3 mg/dL (ref 1.5–2.5)

## 2013-10-14 MED ORDER — SODIUM CHLORIDE 0.9 % IJ SOLN
3.0000 mL | INTRAMUSCULAR | Status: DC | PRN
  Administered 2013-10-14: 3 mL via INTRAVENOUS

## 2013-10-14 MED ORDER — FOLIC ACID 5 MG/ML IJ SOLN
1.0000 mg | Freq: Every day | INTRAMUSCULAR | Status: DC
  Administered 2013-10-14: 1 mg via INTRAVENOUS
  Filled 2013-10-14 (×3): qty 0.2

## 2013-10-14 MED ORDER — SODIUM CHLORIDE 0.9 % IJ SOLN
3.0000 mL | Freq: Two times a day (BID) | INTRAMUSCULAR | Status: DC
  Administered 2013-10-14: 3 mL via INTRAVENOUS

## 2013-10-14 MED ORDER — THIAMINE HCL 100 MG/ML IJ SOLN
100.0000 mg | Freq: Every day | INTRAMUSCULAR | Status: DC
  Administered 2013-10-14: 100 mg via INTRAVENOUS
  Filled 2013-10-14 (×2): qty 1

## 2013-10-14 MED ORDER — POTASSIUM CHLORIDE 10 MEQ/100ML IV SOLN
10.0000 meq | INTRAVENOUS | Status: AC
  Administered 2013-10-14 (×3): 10 meq via INTRAVENOUS
  Filled 2013-10-14 (×3): qty 100
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
## 2013-10-14 NOTE — Plan of Care (Signed)
Problem: Phase I Progression Outcomes Goal: Initial discharge plan identified Outcome: Completed/Met Date Met:  10/14/13 Home with family ( Mother and sister)  Problem: Phase II Progression Outcomes Goal: Time pt extubated/weaned off vent Outcome: Completed/Met Date Met:  10/14/13 0950
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
## 2013-10-14 NOTE — Progress Notes (Signed)
eLink Physician-Brief Progress Note Patient Name: Douglas [name] DOB: 08/26/1976 MRN: [id number]  Date of Service  10/14/2013   HPI/Events of Note   Potassium low  eICU Interventions  KCL supp IV    Intervention Category Major Interventions: Electrolyte abnormality - evaluation and management  Chae Oommen 10/14/2013, 5:59 AM
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
## 2013-10-14 NOTE — Progress Notes (Signed)
10/14/13 1225  Vitals  BP ! 125/57 mmHg  MAP (mmHg) 73  Pulse Rate 65  ECG Heart Rate 70  Resp 14  Oxygen Therapy  SpO2 99 %  HR dipped to SB 48. Patient was resting comfortably in bed. Asymptomatic. Sats and SBP remained normal. Canary Brim, NP notified.
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
## 2013-10-14 NOTE — Plan of Care (Signed)
Problem: Phase I Progression Outcomes Goal: Voiding-avoid urinary catheter unless indicated Outcome: Completed/Met Date Met:  10/14/13 Catheter d/ced
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
## 2013-10-14 NOTE — Progress Notes (Signed)
Dr. Molli Knock notified patient had some pink tinged urine when catheter discontinued. Foley was discontinued per protocol and without difficulty. Heparin SQ d/ced and SCDS continued.

## 2013-10-14 NOTE — Procedures (Signed)
Extubation Procedure Note  Patient Details:   Name: Douglas [name] DOB: 12/07/1975 MRN: [id number]   Airway Documentation:     Evaluation  O2 sats: stable throughout Complications: No apparent complications Patient did tolerate procedure well. Bilateral Breath Sounds: Coarse crackles Suctioning: Airway Yes Cuff leak positive with good vocalization post extubation.  Newt Lukes 10/14/2013, 9:49 AM

## 2013-10-15 ENCOUNTER — Inpatient Hospital Stay (HOSPITAL_COMMUNITY): Payer: Medicaid Other

## 2013-10-15 DIAGNOSIS — S022XXA Fracture of nasal bones, initial encounter for closed fracture: Secondary | ICD-10-CM

## 2013-10-15 DIAGNOSIS — I5023 Acute on chronic systolic (congestive) heart failure: Secondary | ICD-10-CM

## 2013-10-15 LAB — BASIC METABOLIC PANEL
BUN: 7 mg/dL (ref 6–23)
Calcium: 8.6 mg/dL (ref 8.4–10.5)
Creatinine, Ser: 0.99 mg/dL (ref 0.50–1.35)
GFR calc Af Amer: >90 mL/min (ref >90)
GFR calc non Af Amer: >90 mL/min (ref >90)
Glucose, Bld: 91 mg/dL (ref 70–99)
Potassium: 3.9 mEq/L (ref 3.5–5.1)

## 2013-10-15 LAB — CBC
MCHC: 34.8 g/dL (ref 30.0–36.0)
MCV: 87 fL (ref 78.0–100.0)
Platelets: 170 10*3/uL (ref 150–400)
RDW: 13.4 % (ref 11.5–15.5)
WBC: 7 10*3/uL (ref 4.0–10.5)
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
## 2013-10-15 NOTE — Progress Notes (Signed)
Trauma to sign off

## 2013-10-15 NOTE — Progress Notes (Signed)
Patient ID: Douglas [name], male   DOB: Oct 05, 1976, 37 y.o.   MRN: [id number]  Trauma service has nothing additional to add to this patient's care. We will sign off. Please call with any questions.    Freeman Caldron, PA-C Pager: [phone] General Trauma PA Pager: [phone]

## 2013-10-23 ENCOUNTER — Telehealth (HOSPITAL_COMMUNITY): Payer: Self-pay | Admitting: Cardiology

## 2013-10-23 NOTE — Telephone Encounter (Signed)
Will send to Dr bensimhon 

## 2013-10-23 NOTE — Telephone Encounter (Signed)
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

## 2013-10-25 ENCOUNTER — Encounter (HOSPITAL_COMMUNITY): Payer: Self-pay | Admitting: Internal Medicine

## 2013-10-26 NOTE — Telephone Encounter (Signed)
Pt aware letter complete, he ask that it be mailed to him

## 2013-11-06 ENCOUNTER — Ambulatory Visit (HOSPITAL_COMMUNITY)
Admission: RE | Admit: 2013-11-06 | Discharge: 2013-11-06 | Disposition: A | Discharge status: Home / Self Care | Payer: Medicaid Other | Source: Ambulatory Visit | Attending: Internal Medicine | Admitting: Internal Medicine

## 2013-11-06 ENCOUNTER — Encounter (HOSPITAL_COMMUNITY): Payer: Self-pay

## 2013-11-06 VITALS — BP 106/60 | HR 82 | Ht 76.0 in | Wt 328.4 lb

## 2013-11-06 DIAGNOSIS — I10 Essential (primary) hypertension: Secondary | ICD-10-CM | POA: Insufficient documentation

## 2013-11-06 DIAGNOSIS — I472 Ventricular tachycardia: Secondary | ICD-10-CM

## 2013-11-06 DIAGNOSIS — I5022 Chronic systolic (congestive) heart failure: Secondary | ICD-10-CM | POA: Insufficient documentation

## 2013-11-06 NOTE — Patient Instructions (Signed)
Follow up in 3 months   Do the following things EVERYDAY: 1) Weigh yourself in the morning before breakfast. Write it down and keep it in a log. 2) Take your medicines as prescribed 3) Eat low salt foods-Limit salt (sodium) to 2000 mg per day.  4) Stay as active as you can everyday 5) Limit all fluids for the day to less than 2 liters  

## 2013-11-06 NOTE — Progress Notes (Signed)
Patient ID: ISSAIAH SEABROOKS, male   DOB: 01/22/1976, 37 y.o.   MRN: [id number] EP: Dr Graciela Husbands History of Present Illness: LEOCADIO HEAL is a 37 y.o. male with a h/o HTN, HL, obesity, chronic systolic heart failure, S/P Medtronic ICD and chronic systolic heart failure due to NICM. Management has been complicated by severe non-compliance.   August 2012 CPX text VO2 20  Underwent cath in 2006 which showed normal cors with EF 20-25%.   EF had recovered in late 2013 but has again fallen to 15% per echo 01/20/13.  He was admitted to [facility] 02/21/13 from clinic for low output symptoms and volume overload.  He required IV lasix and metolazone.  He diuresed 19 pounds with discharge weight of 254 pounds.  He had runs of VT therefore he was started on amiodarone per EP and Lifevest was placed at discharge.  He underwent RHC on 02/22/13.   RHC 02/22/13  RA = 12  RV = 49/6/15  PA = 54/29 (41)  PCW = 28 (v= 40)  Fick cardiac output/index = 4.3/1.7  PVR = 2.6 Woods  SVR = 1580 dynes  FA sat = 98%  PA sat = 53%, 54%  Non-invasive BP = 124/86 (97)  RHC 05/03/13 RA = 2  RV = 29/2/2  PA = 26/10 (17)  PCW = 3  Fick cardiac output/index = 6.1/2.3  PVR = 2.3  FA sat = 93%  PA sat = 65%, 67%  ECHO 06/25/13 EF 20-25%  Labs: 10/15/13 K 3.9 Creatinine 0.99  He returns for follow up. Since last visit had ICD placed 08/23/13.  Denies SOB/PND/Orthpnea/CP . No shocks. Says he has been more active and has more energy.  Weight at home 328 pounds.He has not had metolazone on over 6 months.  Does admit to SOB with hills. Compliant with medications.  Limiting fluid intake to < 2 liters per day. Eating several meal Not exercising.   Optivol Today 11/06/13 : Activity ~4 hours Fluid below threshold   SH: lives with his Mom. Drinks a beer once a week. Smokes rarely FH: Mom has CAD.      Past Medical History  Diagnosis Date  . Hypertension   . Systolic CHF, chronic   . Obesity   . Nonischemic cardiomyopathy     a.   echo 4/06: EF 30%, mild to mod MR, mild RAE, inf HK, lat HK , ant HK;    b.  cath 4/06: no CAD, EF 20-25%  . ED (erectile dysfunction)   . NSVT (nonsustained ventricular tachycardia)     eval by EP in past; no ICD candidate due to NYHA 1 symptoms  . Automatic implantable cardioverter-defibrillator in situ   . Exertional shortness of breath     Current Outpatient Prescriptions  Medication Sig Dispense Refill  . amiodarone (PACERONE) 200 MG tablet 200 mg daily.       . carvedilol (COREG) 6.25 MG tablet Take 1 tab in AM and 2 tabs in PM, NEEDS FOLLOW UP APPOINTMENT  90 tablet  0  . hydrALAZINE (APRESOLINE) 25 MG tablet Take 1 tablet (25 mg total) by mouth 3 (three) times daily.  90 tablet  3  . isosorbide mononitrate (IMDUR) 30 MG 24 hr tablet Take 30 mg by mouth daily.      Marland Kitchen lisinopril (PRINIVIL,ZESTRIL) 5 MG tablet Take 1 tablet (5 mg total) by mouth 2 (two) times daily.      . metolazone (ZAROXOLYN) 2.5 MG tablet Take 1 tablet (  2.5 mg total) by mouth as needed.  8 tablet  3  . potassium chloride SA (K-DUR,KLOR-CON) 20 MEQ tablet Take 1 tablet (20 mEq total) by mouth 2 (two) times daily.  60 tablet  3  . torsemide (DEMADEX) 20 MG tablet TAKE 3 tABLETS BY MOUTH TWICE DAILY       No current facility-administered medications for this encounter.    Allergies  Allergen Reactions  . Penicillins Other (See Comments)    Unknown childhood reaction     ROS:  Please see the history of present illness.  He denies fevers, chills, melena, hematochezia.  All other systems reviewed and negative.  Vital Signs: Filed Vitals:   11/06/13 1058  BP: 106/60  Pulse: 82  Height: 6\' 4"  (1.93 m)  Weight: 328 lb 6.4 oz (148.961 kg)  SpO2: 97%    PHYSICAL EXAM: Well nourished, well developed, in no acute distress  HEENT: normal Neck: JVP hard to tell due thick neck but does not appear elevated.Carotids 2+ bilaterally.No Bruits  Cardiac:  PMI laterally displaced. Regular rate and rhythm, Luuper chest  scar from ICD Lungs:  clear to auscultation bilaterally, no wheezing, rhonchi or rales Ab: obese. Soft NT.  Non distended.  No bruits. Ext warm. no cyanosis or clubbing, lower extremity edema Skin: warm and dry Psych:normal affect. CNII-XII grossly intact  1) Chronic systolic HF, NICM, ECHO 06/2013  EF 25-30%. S/P Medtronic ICD 08/2013.  - Overall his doing well. NYHA II. Volume status stable. Optivol  Interrogated fluid below threshold. Activity ~ 4 hours per day. Continue Torsemide 60 mg bid.    - Continue coreg to 6.25/12.5. Will not uptitrate BP a little soft.  -Continue lisinopril 5 mg twice a day - Continue Hydralazine 25 mg tid, Imdur 30 mg daily Reviewed CMET from 10/13/13 Reinforced daily weights, low salt food choices, and limiting fluid intake to < 2 liters per day   2) HTN-Stable -Continue coreg to 6.25/12.5mg  -Continue lisinopril 5 mg twice a day  3) NSVT -Continue amiodarone 200 mg daily. Needs yearly eye exam and he is aware. . 08/20/13 TSH 0.91 10/13/13 AST 35 ALT 29   Follow up in 3 months  Kenly Henckel NP-C 11:00 AM

## 2013-11-13 ENCOUNTER — Other Ambulatory Visit (HOSPITAL_COMMUNITY): Payer: Self-pay | Admitting: Internal Medicine

## 2013-11-13 ENCOUNTER — Other Ambulatory Visit (HOSPITAL_COMMUNITY): Payer: Self-pay | Admitting: Anesthesiology

## 2013-11-13 DIAGNOSIS — I509 Heart failure, unspecified: Secondary | ICD-10-CM

## 2013-11-26 ENCOUNTER — Other Ambulatory Visit: Payer: Self-pay

## 2013-11-26 DIAGNOSIS — I509 Heart failure, unspecified: Secondary | ICD-10-CM

## 2013-11-26 MED ORDER — TORSEMIDE 20 MG PO TABS: ORAL_TABLET | ORAL | Status: DC

## 2013-11-27 ENCOUNTER — Encounter: Payer: Self-pay | Admitting: *Deleted

## 2013-12-06 ENCOUNTER — Telehealth: Payer: Self-pay | Admitting: Internal Medicine

## 2013-12-06 NOTE — Telephone Encounter (Signed)
New message      Pt want to know if he can have medication for ED

## 2013-12-07 NOTE — Telephone Encounter (Signed)
Patient calling to speak to you. He stated that he called and left a message for you yesterday and still has not received a return call. He can be reached at [phone]. Thanks, MI

## 2013-12-07 NOTE — Telephone Encounter (Signed)
Advised pt that he cannot take ED medication because he is on Imdur, discussed reasonings. Pt requesting alternative drug, so that he may take ED med. I will discuss with Dr. Graciela Husbands and get back with him. He is agreeable to plan.

## 2013-12-11 ENCOUNTER — Encounter: Payer: Self-pay | Admitting: Internal Medicine

## 2013-12-17 NOTE — Telephone Encounter (Signed)
Follow up  ° ° °Patient calling back to speak with nurse  °

## 2013-12-17 NOTE — Telephone Encounter (Signed)
Advised pt to follow up with heart failure clinic about this matter, to see if alternative is appropriate/possible. Pt verbalized understanding and agreeable to plan.

## 2013-12-31 ENCOUNTER — Other Ambulatory Visit (HOSPITAL_COMMUNITY): Payer: Self-pay | Admitting: Anesthesiology

## 2014-01-01 ENCOUNTER — Other Ambulatory Visit (HOSPITAL_COMMUNITY): Payer: Self-pay | Admitting: *Deleted

## 2014-01-01 MED ORDER — AMIODARONE HCL 200 MG PO TABS: 200.0000 mg | ORAL_TABLET | Freq: Every day | ORAL | Status: DC

## 2014-01-09 ENCOUNTER — Encounter: Payer: Medicaid Other | Admitting: Cardiology

## 2014-01-22 ENCOUNTER — Ambulatory Visit (INDEPENDENT_AMBULATORY_CARE_PROVIDER_SITE_OTHER): Payer: Medicaid Other | Admitting: Cardiology

## 2014-01-22 ENCOUNTER — Encounter: Payer: Self-pay | Admitting: Internal Medicine

## 2014-01-22 ENCOUNTER — Encounter: Payer: Self-pay | Admitting: Cardiology

## 2014-01-22 VITALS — BP 115/70 | HR 77 | Ht 76.0 in | Wt 333.0 lb

## 2014-01-22 DIAGNOSIS — I428 Other cardiomyopathies: Secondary | ICD-10-CM

## 2014-01-22 DIAGNOSIS — I472 Ventricular tachycardia, unspecified: Secondary | ICD-10-CM

## 2014-01-22 DIAGNOSIS — I4729 Other ventricular tachycardia: Secondary | ICD-10-CM

## 2014-01-22 DIAGNOSIS — I5033 Acute on chronic diastolic (congestive) heart failure: Secondary | ICD-10-CM

## 2014-01-22 DIAGNOSIS — Z95810 Presence of automatic (implantable) cardiac defibrillator: Secondary | ICD-10-CM

## 2014-01-22 NOTE — Patient Instructions (Addendum)
Your physician wants you to follow-up in: 8 months with Dr. Jacquiline Doe will receive a reminder letter in the mail two months in advance. If you don't receive a letter, please call our office to schedule the follow-up appointment.  Remote monitoring is used to monitor your defibrillator of ICD from home. This monitoring reduces the number of office visits required to check your device to one time per year. It allows [place] to keep an eye on the functioning of your device to ensure it is working properly. You are scheduled for a device check from home on in 3 months  You may send your transmission at any time that day. If you have a wireless device, the transmission will be sent automatically. After your physician reviews your transmission, you will receive a postcard with your next transmission date.    Your physician recommends that you continue on your current medications as directed. Please refer to the Current Medication list given to you today.

## 2014-01-22 NOTE — Progress Notes (Signed)
ELECTROPHYSIOLOGY OFFICE NOTE  Patient ID: Douglas [name] MRN: [id number], DOB/AGE: February 23, 1976   Date of Visit: 01/22/2014  Primary Physician: Pcp Not In System Primary Cardiologist: [name], MD Primary EP: Graciela Husbands, MD Reason for Visit: EP/device follow-up  History of Present Illness  Douglas [name] is a 38 y.o. male with a NICM, EF 20%, s/p ICD implant Oct 2014, paroxysmal VT on amiodarone and chronic systolic HF. He presents today for overdue 21-month post ICD implant follow-up.   Since last being seen in our clinic, he reports he is doing well and has no complaints. He has chronic dyspnea with moderate exertion but denies worsening from baseline. He denies chest pain. He denies palpitations, dizziness, near syncope or syncope. He denies LE swelling, orthopnea, PND or recent weight gain. He denies ICD shocks. He is compliant with medications and low sodium diet.  Past Medical History Past Medical History  Diagnosis Date  . Hypertension   . Systolic CHF, chronic   . Obesity   . Nonischemic cardiomyopathy     a.  echo 4/06: EF 30%, mild to mod MR, mild RAE, inf HK, lat HK , ant HK;    b.  cath 4/06: no CAD, EF 20-25%  . ED (erectile dysfunction)   . NSVT (nonsustained ventricular tachycardia)     eval by EP in past; no ICD candidate due to NYHA 1 symptoms  . Automatic implantable cardioverter-defibrillator in situ   . Exertional shortness of breath     Past Surgical History Past Surgical History  Procedure Laterality Date  . Multiple extractions with alveoloplasty N/A 01/26/2013    Procedure:  EXTRACION  TOOTH # 19 WITH ALVEOLOPLASTY;  Surgeon: Charlynne Pander, DDS;  Location: [facility] OR;  Service: Oral Surgery;  Laterality: N/A;  . Cardiac defibrillator placement  08/23/2013  . Cardiac catheterization  09/2011; 02/2013; 04/2013    Allergies/Intolerances Allergies  Allergen Reactions  . Penicillins Other (See Comments)    Unknown childhood reaction     Current Home  Medications Current Outpatient Prescriptions  Medication Sig Dispense Refill  . amiodarone (PACERONE) 200 MG tablet Take 1 tablet (200 mg total) by mouth daily.  30 tablet  2  . carvedilol (COREG) 6.25 MG tablet Take one tab in the AM and two in the PM  90 tablet  6  . hydrALAZINE (APRESOLINE) 25 MG tablet TAKE 1 TABLET BY MOUTH THREE TIMES A DAY  102 tablet  2  . isosorbide mononitrate (IMDUR) 30 MG 24 hr tablet TAKE 1 TABLET BY MOUTH ONCE DAILY  34 tablet  2  . lisinopril (PRINIVIL,ZESTRIL) 5 MG tablet TAKE 1 TABLET BY MOUTH TWICE DAILY  68 tablet  3  . metolazone (ZAROXOLYN) 2.5 MG tablet Take 1 tablet (2.5 mg total) by mouth as needed.  8 tablet  3  . potassium chloride SA (K-DUR,KLOR-CON) 20 MEQ tablet TAKE 1 TABLET BY MOUTH TWICE DAILY  68 tablet  2  . torsemide (DEMADEX) 20 MG tablet Take 3 tabs by mouth twice daily       No current facility-administered medications for this visit.    Social History History   Social History  . Marital Status: Legally Separated    Spouse Name: N/A    Number of Children: 5  . Years of Education: N/A   Occupational History  . unemployed    Social History Main Topics  . Smoking status: Current Every Day Smoker -- 0.12 packs/day for 8 years    Types: Cigarettes  .  Smokeless tobacco: Never Used  . Alcohol Use: 0.0 oz/week     Comment: 08/23/2013  "rarely have a beer anymore; last beer was a long time ago"  . Drug Use: Yes    Special: Cocaine     Comment: 08/23/2013  "last cocaine October 2013"  . Sexual Activity: Not Currently   Other Topics Concern  . Not on file   Social History Narrative  . No narrative on file     Review of Systems General: No chills, fever, night sweats or weight changes Cardiovascular: No chest pain, dyspnea on exertion, edema, orthopnea, palpitations, paroxysmal nocturnal dyspnea Dermatological: No rash, lesions or masses Respiratory: No cough, dyspnea Urologic: No hematuria, dysuria Abdominal: No nausea,  vomiting, diarrhea, bright red blood per rectum, melena, or hematemesis Neurologic: No visual changes, weakness, changes in mental status All other systems reviewed and are otherwise negative except as noted above.  Physical Exam Vitals: Blood pressure 115/70, pulse 77, height 6\' 4"  (1.93 m), weight 333 lb (151.048 kg).  General: Well developed, well appearing 38 y.o. male in no acute distress. HEENT: Normocephalic, atraumatic. EOMs intact. Sclera nonicteric. Oropharynx clear.  Neck: Supple. No JVD. Lungs: Respirations regular and unlabored, CTA bilaterally. No wheezes, rales or rhonchi. Heart: RRR. S1, S2 present. No murmurs, rub, S3 or S4. Abdomen: Soft, non-distended. Extremities: No clubbing, cyanosis or edema. PT/Radials 2+ and equal bilaterally. Psych: Normal affect. Neuro: Alert and oriented X 3. Moves all extremities spontaneously. Skin: Left upper chest / implant site intact and well healed.   Diagnostics 12-lead ECG today - NSR at 77 bpm; PR 186, QRS 92, QTc 457 Device interrogation today - Normal device function. Thresholds and sensing consistent with previous device measurements. Impedance trends stable over time. 1 VT-NS episode, 1 second in duration, EGM reviewed and consistent with NSVT. Otherwise no evidence of any ventricular arrhythmias. Histogram distribution appropriate for patient and level of activity. No changes made this session. Device programmed at appropriate safety margins. Device programmed to optimize intrinsic conduction. Estimated longevity 11.2 years.   Assessment and Plan  1. NICM, EF 20%, s/p ICD implant Normal device function No programming changes made Enroll in [facility] for routine remote follow-up every 3 months Return for follow-up with Dr. 24 in Oct 2015   2. Paroxysmal VT Stable  No VT/VF episodes; one NSVT episode 3 beats in duration Continue amiodarone as directed by Dr. Nov 2015  Enroll for amiodarone surveillance labs /  follow-up  Signed, Graciela Husbands, PA-C 01/22/2014, 1:24 PM

## 2014-01-28 LAB — MDC_IDC_ENUM_SESS_TYPE_INCLINIC
Date Time Interrogation Session: 20150310171335
HighPow Impedance: 228 Ohm
HighPow Impedance: 59 Ohm
HighPow Impedance: 77 Ohm
Lead Channel Pacing Threshold Amplitude: 0.75 V
Lead Channel Pacing Threshold Pulse Width: 0.4 ms
Lead Channel Sensing Intrinsic Amplitude: 12.625 mV
MDC IDC MSMT BATTERY REMAINING LONGEVITY: 134 mo
MDC IDC MSMT BATTERY VOLTAGE: 3.08 V
MDC IDC MSMT LEADCHNL RV IMPEDANCE VALUE: 513 Ohm
MDC IDC SET LEADCHNL RV PACING AMPLITUDE: 2 V
MDC IDC SET LEADCHNL RV PACING PULSEWIDTH: 0.4 ms
MDC IDC SET LEADCHNL RV SENSING SENSITIVITY: 0.3 mV
MDC IDC SET ZONE DETECTION INTERVAL: 360 ms
MDC IDC SET ZONE DETECTION INTERVAL: 370 ms
MDC IDC STAT BRADY RV PERCENT PACED: 0.01 %
Zone Setting Detection Interval: 300 ms

## 2014-01-29 ENCOUNTER — Ambulatory Visit (HOSPITAL_COMMUNITY)
Admission: RE | Admit: 2014-01-29 | Discharge: 2014-01-29 | Disposition: A | Discharge status: Home / Self Care | Payer: Medicaid Other | Source: Ambulatory Visit | Attending: Internal Medicine | Admitting: Internal Medicine

## 2014-01-29 VITALS — BP 118/58 | HR 78 | Resp 18 | Wt 337.5 lb

## 2014-01-29 DIAGNOSIS — I428 Other cardiomyopathies: Secondary | ICD-10-CM

## 2014-01-29 DIAGNOSIS — I4729 Other ventricular tachycardia: Secondary | ICD-10-CM

## 2014-01-29 DIAGNOSIS — I472 Ventricular tachycardia, unspecified: Secondary | ICD-10-CM | POA: Insufficient documentation

## 2014-01-29 DIAGNOSIS — Z95810 Presence of automatic (implantable) cardiac defibrillator: Secondary | ICD-10-CM

## 2014-01-29 DIAGNOSIS — I5022 Chronic systolic (congestive) heart failure: Secondary | ICD-10-CM | POA: Insufficient documentation

## 2014-01-29 DIAGNOSIS — Z950 Presence of cardiac pacemaker: Secondary | ICD-10-CM | POA: Insufficient documentation

## 2014-01-29 DIAGNOSIS — I10 Essential (primary) hypertension: Secondary | ICD-10-CM | POA: Insufficient documentation

## 2014-01-29 DIAGNOSIS — I509 Heart failure, unspecified: Secondary | ICD-10-CM | POA: Insufficient documentation

## 2014-01-29 LAB — COMPREHENSIVE METABOLIC PANEL
ALBUMIN: 3.4 g/dL — AB (ref 3.5–5.2)
ALT: 42 U/L (ref 0–53)
AST: 31 U/L (ref 0–37)
Alkaline Phosphatase: 90 U/L (ref 39–117)
BUN: 11 mg/dL (ref 6–23)
CALCIUM: 9.3 mg/dL (ref 8.4–10.5)
CHLORIDE: 96 meq/L (ref 96–112)
CO2: 30 mEq/L (ref 19–32)
CREATININE: 1.04 mg/dL (ref 0.50–1.35)
GFR calc Af Amer: >90 mL/min (ref >90)
GFR calc non Af Amer: 89 mL/min — ABNORMAL LOW (ref >90)
Glucose, Bld: 188 mg/dL — ABNORMAL HIGH (ref 70–99)
Potassium: 4 mEq/L (ref 3.7–5.3)
Sodium: 139 mEq/L (ref 137–147)
Total Bilirubin: 0.3 mg/dL (ref 0.3–1.2)
Total Protein: 6.9 g/dL (ref 6.0–8.3)

## 2014-01-29 LAB — TSH: TSH: 0.844 u[IU]/mL (ref 0.350–4.500)

## 2014-01-29 LAB — T4, FREE: FREE T4: 1.56 ng/dL (ref 0.80–1.80)

## 2014-01-29 LAB — T3, FREE: T3 FREE: 3.8 pg/mL (ref 2.3–4.2)

## 2014-01-29 MED ORDER — CARVEDILOL 6.25 MG PO TABS: ORAL_TABLET | ORAL | Status: DC

## 2014-01-29 MED ORDER — LISINOPRIL 5 MG PO TABS: 5.0000 mg | ORAL_TABLET | Freq: Two times a day (BID) | ORAL | Status: DC

## 2014-01-29 MED ORDER — ISOSORBIDE MONONITRATE ER 30 MG PO TB24: 30.0000 mg | ORAL_TABLET | Freq: Every day | ORAL | Status: DC

## 2014-01-29 MED ORDER — TORSEMIDE 20 MG PO TABS: 60.0000 mg | ORAL_TABLET | Freq: Two times a day (BID) | ORAL | Status: DC

## 2014-01-29 MED ORDER — AMIODARONE HCL 200 MG PO TABS: 200.0000 mg | ORAL_TABLET | Freq: Every day | ORAL | Status: DC

## 2014-01-29 MED ORDER — POTASSIUM CHLORIDE CRYS ER 20 MEQ PO TBCR: 20.0000 meq | EXTENDED_RELEASE_TABLET | Freq: Two times a day (BID) | ORAL | Status: DC

## 2014-01-29 MED ORDER — HYDRALAZINE HCL 25 MG PO TABS: 25.0000 mg | ORAL_TABLET | Freq: Three times a day (TID) | ORAL | Status: DC

## 2014-01-29 NOTE — Patient Instructions (Signed)
Increase your coreg to 9.375 mg (1 1/2 tablets) every morning and 12.5 mg (2 tablets) every evening.  Follow up 3 months with ECHO.  Do the following things EVERYDAY: 1) Weigh yourself in the morning before breakfast. Write it down and keep it in a log. 2) Take your medicines as prescribed 3) Eat low salt foods-Limit salt (sodium) to 2000 mg per day.  4) Stay as active as you can everyday 5) Limit all fluids for the day to less than 2 liters 6)

## 2014-01-29 NOTE — Progress Notes (Addendum)
Patient ID: Douglas [name], male   DOB: 1976-01-15, 38 y.o.   MRN: [id number]  EP: Dr Graciela Husbands PCP: Trying to find one Primary Cardiologist: Dr. [name]  History of Present Illness: Douglas [name] is a 38 y.o. male with a h/o HTN, HL, obesity, NICM and chronic systolic heart failure. He is S/P Medtronic ICD due to NICM. Management has been complicated by severe non-compliance.   August 2012 CPX text VO2 20  Underwent cath in 2006 which showed normal cors with EF 20-25%.   EF had recovered in late 2013 but has again fallen to 15% per echo 01/20/13.  He was admitted to [facility] 02/21/13 from clinic for low output symptoms and volume overload.  He required IV lasix and metolazone.  He diuresed 19 pounds with discharge weight of 254 pounds.  He had runs of VT therefore he was started on amiodarone per EP and Lifevest was placed at discharge.  He underwent RHC on 02/22/13.   RHC 02/22/13  RA = 12  RV = 49/6/15  PA = 54/29 (41)  PCW = 28 (v= 40)  Fick cardiac output/index = 4.3/1.7  PVR = 2.6 Woods  SVR = 1580 dynes  FA sat = 98%  PA sat = 53%, 54%  Non-invasive BP = 124/86 (97)  RHC 05/03/13 RA = 2  RV = 29/2/2  PA = 26/10 (17)  PCW = 3  Fick cardiac output/index = 6.1/2.3  PVR = 2.3  FA sat = 93%  PA sat = 65%, 67%  ECHO 06/25/13 EF 20-25%  Follow up: Doing well. Saw EP and normal device function. Denies SOB, CP, PND, or edema. Weight at home 330-331 lbs. Pretty active and able to walk upstairs with no issues. Complaint with medications. Trying to follow low salt diet and reports drinking less than 2L a day. Some dizziness with position changes.   Optivol: Activity ~4 hours, Fluid below threshold, and no afib  Labs: 10/15/13 K 3.9 Creatinine 0.99  SH: lives with his Mom. No ETOH Smokes rarely  FH: Mom has CAD.    ROS:  Please see the history of present illness.  He denies fevers, chills, melena, hematochezia.  All other systems reviewed and negative.  Past Medical History   Diagnosis Date  . Hypertension   . Systolic CHF, chronic   . Obesity   . Nonischemic cardiomyopathy     a.  echo 4/06: EF 30%, mild to mod MR, mild RAE, inf HK, lat HK , ant HK;    b.  cath 4/06: no CAD, EF 20-25%  . ED (erectile dysfunction)   . NSVT (nonsustained ventricular tachycardia)     eval by EP in past; no ICD candidate due to NYHA 1 symptoms  . Automatic implantable cardioverter-defibrillator in situ   . Exertional shortness of breath     Current Outpatient Prescriptions  Medication Sig Dispense Refill  . amiodarone (PACERONE) 200 MG tablet Take 1 tablet (200 mg total) by mouth daily.  30 tablet  2  . carvedilol (COREG) 6.25 MG tablet Take one tab in the AM and two in the PM  90 tablet  6  . hydrALAZINE (APRESOLINE) 25 MG tablet TAKE 1 TABLET BY MOUTH THREE TIMES A DAY  102 tablet  2  . isosorbide mononitrate (IMDUR) 30 MG 24 hr tablet TAKE 1 TABLET BY MOUTH ONCE DAILY  34 tablet  2  . lisinopril (PRINIVIL,ZESTRIL) 5 MG tablet TAKE 1 TABLET BY MOUTH TWICE DAILY  68 tablet  3  . metolazone (ZAROXOLYN) 2.5 MG tablet Take 1 tablet (2.5 mg total) by mouth as needed.  8 tablet  3  . potassium chloride SA (K-DUR,KLOR-CON) 20 MEQ tablet TAKE 1 TABLET BY MOUTH TWICE DAILY  68 tablet  2  . torsemide (DEMADEX) 20 MG tablet Take 3 tabs by mouth twice daily       No current facility-administered medications for this encounter.    Allergies  Allergen Reactions  . Penicillins Other (See Comments)    Unknown childhood reaction     Filed Vitals:   01/29/14 1013  BP: 118/58  Pulse: 78  Resp: 18  Weight: 337 lb 8 oz (153.089 kg)  SpO2: 98%    PHYSICAL EXAM: General: Well nourished, well developed, in no acute distress  HEENT: normal Neck: JVP hard to tell due thick neck but does not appear elevated.Carotids 2+ bilaterally.No Bruits  Cardiac:  PMI laterally displaced. Regular rate and rhythm,  Lungs:  clear to auscultation bilaterally, no wheezing, rhonchi or rales Ab:  obese. Soft NT.  Non distended.  No bruits. Ext warm. no cyanosis or clubbing or lower extremity edema Skin: warm and dry Psych:normal affect. CNII-XII grossly intact  1) Chronic systolic HF: NICM, EF 25-30% (07/7025) S/P Medtronic ICD 08/2013.  - Overall his doing well. NYHA II. Volume status stable. Optivol  Interrogated fluid below threshold. Activity ~ 4 hours per day and no A-Fib. Continue Torsemide 60 mg bid.    - Will increase coreg to 9.375 mg q am and 12.5 mg q pm.  -Continue lisinopril 5 mg twice a day - Continue Hydralazine 25 mg tid, Imdur 30 mg daily - Will get CMET today along with TSH, free T3 and free T4 being on Amiodarone. - Repeat ECHO in 3 months. - Reinforced daily weights, low salt food choices, and limiting fluid intake to < 2 liters per day  2) HTN-Stable -Increase coreg to 9.375 mg qam and 12.5 mg q pm for LV dysfunction. -Continue lisinopril 5 mg twice a day 3) NSVT -Continue amiodarone 200 mg daily. Going for eye exam Monday. As above will get TSH and LFTs today.    Follow up in 3 months with ECHO  Ulla Potash B NP-C 10:18 AM

## 2014-02-07 ENCOUNTER — Encounter: Payer: Self-pay | Admitting: Internal Medicine

## 2014-04-10 ENCOUNTER — Other Ambulatory Visit (HOSPITAL_COMMUNITY): Payer: Self-pay | Admitting: *Deleted

## 2014-04-10 DIAGNOSIS — I509 Heart failure, unspecified: Secondary | ICD-10-CM

## 2014-04-10 DIAGNOSIS — Z95810 Presence of automatic (implantable) cardiac defibrillator: Secondary | ICD-10-CM

## 2014-04-10 DIAGNOSIS — I4729 Other ventricular tachycardia: Secondary | ICD-10-CM

## 2014-04-10 DIAGNOSIS — I428 Other cardiomyopathies: Secondary | ICD-10-CM

## 2014-04-10 DIAGNOSIS — I472 Ventricular tachycardia, unspecified: Secondary | ICD-10-CM

## 2014-04-10 MED ORDER — LISINOPRIL 5 MG PO TABS: 5.0000 mg | ORAL_TABLET | Freq: Two times a day (BID) | ORAL | Status: DC

## 2014-04-10 MED ORDER — TORSEMIDE 20 MG PO TABS: 60.0000 mg | ORAL_TABLET | Freq: Two times a day (BID) | ORAL | Status: DC

## 2014-04-10 MED ORDER — CARVEDILOL 6.25 MG PO TABS: ORAL_TABLET | ORAL | Status: DC

## 2014-04-10 MED ORDER — POTASSIUM CHLORIDE CRYS ER 20 MEQ PO TBCR: 20.0000 meq | EXTENDED_RELEASE_TABLET | Freq: Two times a day (BID) | ORAL | Status: DC

## 2014-04-10 MED ORDER — ISOSORBIDE MONONITRATE ER 30 MG PO TB24: 30.0000 mg | ORAL_TABLET | Freq: Every day | ORAL | Status: DC

## 2014-05-01 ENCOUNTER — Ambulatory Visit (HOSPITAL_BASED_OUTPATIENT_CLINIC_OR_DEPARTMENT_OTHER)
Admission: RE | Admit: 2014-05-01 | Discharge: 2014-05-01 | Disposition: A | Discharge status: Home / Self Care | Payer: Medicaid Other | Source: Ambulatory Visit | Attending: Internal Medicine | Admitting: Internal Medicine

## 2014-05-01 ENCOUNTER — Encounter (HOSPITAL_COMMUNITY): Payer: Self-pay

## 2014-05-01 ENCOUNTER — Ambulatory Visit (HOSPITAL_COMMUNITY)
Admission: RE | Admit: 2014-05-01 | Discharge: 2014-05-01 | Disposition: A | Discharge status: Home / Self Care | Payer: Medicaid Other | Source: Ambulatory Visit | Attending: Internal Medicine | Admitting: Internal Medicine

## 2014-05-01 VITALS — BP 130/96 | HR 76 | Wt 335.1 lb

## 2014-05-01 DIAGNOSIS — I472 Ventricular tachycardia, unspecified: Secondary | ICD-10-CM | POA: Diagnosis not present

## 2014-05-01 DIAGNOSIS — I4729 Other ventricular tachycardia: Secondary | ICD-10-CM | POA: Diagnosis not present

## 2014-05-01 DIAGNOSIS — I5022 Chronic systolic (congestive) heart failure: Secondary | ICD-10-CM | POA: Insufficient documentation

## 2014-05-01 DIAGNOSIS — I517 Cardiomegaly: Secondary | ICD-10-CM

## 2014-05-01 DIAGNOSIS — I509 Heart failure, unspecified: Secondary | ICD-10-CM | POA: Diagnosis not present

## 2014-05-01 DIAGNOSIS — I10 Essential (primary) hypertension: Secondary | ICD-10-CM | POA: Diagnosis not present

## 2014-05-01 MED ORDER — CARVEDILOL 12.5 MG PO TABS: ORAL_TABLET | ORAL | Status: DC

## 2014-05-01 MED ORDER — CARVEDILOL 12.5 MG PO TABS: 12.5000 mg | ORAL_TABLET | Freq: Two times a day (BID) | ORAL | Status: DC

## 2014-05-01 NOTE — Progress Notes (Signed)
  Echocardiogram 2D Echocardiogram has been performed.  [name], Douglas FRANCES 05/01/2014, 10:52 AM

## 2014-05-01 NOTE — Patient Instructions (Signed)
INCREASE Carvedilol to 12.5 mg twice a day  Your physician recommends that you schedule a follow-up appointment in: 3 months  Do the following things EVERYDAY: 1) Weigh yourself in the morning before breakfast. Write it down and keep it in a log. 2) Take your medicines as prescribed 3) Eat low salt foods-Limit salt (sodium) to 2000 mg per day.  4) Stay as active as you can everyday 5) Limit all fluids for the day to less than 2 liters 6)

## 2014-05-01 NOTE — Progress Notes (Signed)
Patient ID: Douglas [name], male   DOB: 07-02-1976, 38 y.o.   MRN: [id number]  EP: Dr Graciela Husbands PCP: Trying to find one Primary Cardiologist: Dr. [name]  History of Present Illness: Douglas [name] is a 38 y.o. male with a h/o HTN, HL, obesity, NICM and chronic systolic heart failure. He is S/P Medtronic ICD due to NICM. Management has been complicated by severe non-compliance.   August 2012 CPX text VO2 20  Underwent cath in 2006 which showed normal cors with EF 20-25%.   EF had recovered in late 2013 but has again fallen to 15% per echo 01/20/13.  He was admitted to [facility] 02/21/13 from clinic for low output symptoms and volume overload.  He required IV lasix and metolazone.  He diuresed 19 pounds with discharge weight of 254 pounds.  He had runs of VT therefore he was started on amiodarone per EP and Lifevest was placed at discharge.  He underwent RHC on 02/22/13.   RHC 02/22/13  RA = 12  RV = 49/6/15  PA = 54/29 (41)  PCW = 28 (v= 40)  Fick cardiac output/index = 4.3/1.7  PVR = 2.6 Woods  SVR = 1580 dynes  FA sat = 98%  PA sat = 53%, 54%  Non-invasive BP = 124/86 (97)  RHC 05/03/13 RA = 2  RV = 29/2/2  PA = 26/10 (17)  PCW = 3  Fick cardiac output/index = 6.1/2.3  PVR = 2.3  FA sat = 93%  PA sat = 65%, 67%  ECHO 06/25/13 EF 20-25% [facility] 05/01/14 EF 45%  Follow up: Returns for f/u. Doing well. Denies SOB, CP, PND, or edema. Weight at home 330-335 lbs. Compliant with medicines - getting them at [facility]). Pretty active and able to walk upstairs with no issues.  Trying to follow low salt diet and reports drinking less than 2L a day. Occasioanl dizziness.  Optivol: Activity ~4 hours, Fluid below threshold, and no afib or VT  Labs: 10/15/13 K 3.9 Creatinine 0.99  SH: lives with his Mom. No ETOH Smokes rarely  FH: Mom has CAD.    ROS:  Please see the history of present illness.  He denies fevers, chills, melena, hematochezia.  All other systems reviewed and  negative.  Past Medical History  Diagnosis Date  . Hypertension   . Systolic CHF, chronic   . Obesity   . Nonischemic cardiomyopathy     a.  echo 4/06: EF 30%, mild to mod MR, mild RAE, inf HK, lat HK , ant HK;    b.  cath 4/06: no CAD, EF 20-25%  . ED (erectile dysfunction)   . NSVT (nonsustained ventricular tachycardia)     eval by EP in past; no ICD candidate due to NYHA 1 symptoms  . Automatic implantable cardioverter-defibrillator in situ   . Exertional shortness of breath     Current Outpatient Prescriptions  Medication Sig Dispense Refill  . amiodarone (PACERONE) 200 MG tablet Take 1 tablet (200 mg total) by mouth daily.  30 tablet  2  . carvedilol (COREG) 6.25 MG tablet Take 9.375 mg (1 1/2 tablets) every morning and 12.5 mg (2 tablets) every evening.  105 tablet  3  . hydrALAZINE (APRESOLINE) 25 MG tablet Take 1 tablet (25 mg total) by mouth 3 (three) times daily.  90 tablet  1  . isosorbide mononitrate (IMDUR) 30 MG 24 hr tablet Take 1 tablet (30 mg total) by mouth daily.  30 tablet  3  .  lisinopril (PRINIVIL,ZESTRIL) 5 MG tablet Take 1 tablet (5 mg total) by mouth 2 (two) times daily.  60 tablet  3  . metolazone (ZAROXOLYN) 2.5 MG tablet Take 1 tablet (2.5 mg total) by mouth as needed.  8 tablet  3  . potassium chloride SA (K-DUR,KLOR-CON) 20 MEQ tablet Take 1 tablet (20 mEq total) by mouth 2 (two) times daily.  60 tablet  3  . torsemide (DEMADEX) 20 MG tablet Take 3 tablets (60 mg total) by mouth 2 (two) times daily.  180 tablet  3   No current facility-administered medications for this encounter.    Allergies  Allergen Reactions  . Penicillins Other (See Comments)    Unknown childhood reaction     Filed Vitals:   05/01/14 1105  BP: 130/96  Pulse: 76  Weight: 335 lb 1.9 oz (152.009 kg)  SpO2: 98%    PHYSICAL EXAM: General: Well nourished, well developed, in no acute distress  HEENT: normal Neck: JVP hard to tell due thick neck but does not appear  elevated.Carotids 2+ bilaterally.No Bruits  Cardiac:  PMI laterally displaced. Regular rate and rhythm,  Lungs:  clear to auscultation bilaterally, no wheezing, rhonchi or rales Ab: obese. Soft NT.  Non distended.  No bruits. Ext warm. no cyanosis or clubbing or lower extremity edema Skin: warm and dry Psych:normal affect. CNII-XII grossly intact  1) Chronic systolic HF: NICM, EF 25-30% (04/9484) -> 45% (05/01/14) S/P Medtronic ICD 08/2013.  - Overall he is doing well. NYHA II. Volume status stable. Optivol  Interrogated fluid below threshold. Activity ~ 4 hours per day and no A-Fib. Continue Torsemide 60 mg bid.  - Echo reviewed today. EF improved to 45% - Will increase coreg to 12.5 mg q am and 12.5 mg q pm.  -Continue lisinopril 5 mg twice a day - Continue Hydralazine 25 mg tid, Imdur 30 mg dail - Reinforced daily weights, low salt food choices, and limiting fluid intake to < 2 liters per day  2) HTN-Stable -Increase coreg to 12.5 mg qam and 12.5 mg q pm for LV dysfunction. -Continue lisinopril 5 mg twice a day 3) NSVT -Continue amiodarone 200 mg daily. Eye exam, TSH and LFTs up to date.     Follow up in 3 months  [name] MD  11:32 AM

## 2014-05-06 ENCOUNTER — Ambulatory Visit (INDEPENDENT_AMBULATORY_CARE_PROVIDER_SITE_OTHER): Payer: Medicaid Other | Admitting: *Deleted

## 2014-05-06 ENCOUNTER — Encounter: Payer: Self-pay | Admitting: Internal Medicine

## 2014-05-06 ENCOUNTER — Other Ambulatory Visit (HOSPITAL_COMMUNITY): Payer: Self-pay | Admitting: Anesthesiology

## 2014-05-06 DIAGNOSIS — I509 Heart failure, unspecified: Secondary | ICD-10-CM

## 2014-05-06 DIAGNOSIS — I5023 Acute on chronic systolic (congestive) heart failure: Secondary | ICD-10-CM

## 2014-05-06 DIAGNOSIS — I4729 Other ventricular tachycardia: Secondary | ICD-10-CM

## 2014-05-06 DIAGNOSIS — I472 Ventricular tachycardia, unspecified: Secondary | ICD-10-CM

## 2014-05-06 DIAGNOSIS — I5033 Acute on chronic diastolic (congestive) heart failure: Secondary | ICD-10-CM

## 2014-05-06 DIAGNOSIS — I428 Other cardiomyopathies: Secondary | ICD-10-CM

## 2014-05-06 LAB — MDC_IDC_ENUM_SESS_TYPE_INCLINIC
Date Time Interrogation Session: 20150622110553
HIGH POWER IMPEDANCE MEASURED VALUE: 61 Ohm
HighPow Impedance: 228 Ohm
HighPow Impedance: 50 Ohm
Lead Channel Pacing Threshold Amplitude: 0.875 V
Lead Channel Pacing Threshold Pulse Width: 0.4 ms
Lead Channel Sensing Intrinsic Amplitude: 10.75 mV
Lead Channel Sensing Intrinsic Amplitude: 11.75 mV
Lead Channel Setting Sensing Sensitivity: 0.3 mV
MDC IDC MSMT BATTERY REMAINING LONGEVITY: 134 mo
MDC IDC MSMT BATTERY VOLTAGE: 3.06 V
MDC IDC MSMT LEADCHNL RV IMPEDANCE VALUE: 456 Ohm
MDC IDC SET LEADCHNL RV PACING AMPLITUDE: 2.5 V
MDC IDC SET LEADCHNL RV PACING PULSEWIDTH: 0.4 ms
MDC IDC SET ZONE DETECTION INTERVAL: 370 ms
MDC IDC STAT BRADY RV PERCENT PACED: 0.01 %
Zone Setting Detection Interval: 300 ms
Zone Setting Detection Interval: 360 ms

## 2014-05-06 NOTE — Progress Notes (Signed)
ICD check in clinic. Normal device function. Threshold and sensing consistent with previous device measurements. Impedance trends stable over time. 1 NSVT episode on 6-15 x 7 bts @200bpm . Stable thoracic impedance at present. Histogram distribution appropriate for patient and level of activity. No changes made this session. Device programmed at appropriate safety margins. Device programmed to optimize intrinsic conduction. Estimated longevity 11.1 years. Pt enrolled in remote follow-up. Plan to continue ICM follow up as scheduled and F/U w/SK on 10-13. Patient education completed including shock plan. Alert tones demonstrated for patient.

## 2014-08-14 ENCOUNTER — Encounter (HOSPITAL_COMMUNITY): Payer: Self-pay

## 2014-08-26 ENCOUNTER — Ambulatory Visit (HOSPITAL_COMMUNITY)
Admission: RE | Admit: 2014-08-26 | Discharge: 2014-08-26 | Disposition: A | Discharge status: Home / Self Care | Payer: Medicaid Other | Source: Ambulatory Visit | Attending: Cardiology | Admitting: Cardiology

## 2014-08-26 VITALS — BP 136/88 | HR 79 | Wt 335.5 lb

## 2014-08-26 DIAGNOSIS — I5022 Chronic systolic (congestive) heart failure: Secondary | ICD-10-CM | POA: Diagnosis not present

## 2014-08-26 DIAGNOSIS — I10 Essential (primary) hypertension: Secondary | ICD-10-CM | POA: Diagnosis not present

## 2014-08-26 DIAGNOSIS — I5023 Acute on chronic systolic (congestive) heart failure: Secondary | ICD-10-CM | POA: Diagnosis not present

## 2014-08-26 DIAGNOSIS — I429 Cardiomyopathy, unspecified: Secondary | ICD-10-CM | POA: Insufficient documentation

## 2014-08-26 DIAGNOSIS — I472 Ventricular tachycardia, unspecified: Secondary | ICD-10-CM

## 2014-08-26 DIAGNOSIS — Z95810 Presence of automatic (implantable) cardiac defibrillator: Secondary | ICD-10-CM | POA: Diagnosis not present

## 2014-08-26 DIAGNOSIS — Z79899 Other long term (current) drug therapy: Secondary | ICD-10-CM | POA: Diagnosis not present

## 2014-08-26 LAB — COMPREHENSIVE METABOLIC PANEL
ALT: 42 U/L (ref 0–53)
AST: 33 U/L (ref 0–37)
Albumin: 3.4 g/dL — ABNORMAL LOW (ref 3.5–5.2)
Alkaline Phosphatase: 84 U/L (ref 39–117)
Anion gap: 15 (ref 5–15)
BILIRUBIN TOTAL: 0.5 mg/dL (ref 0.3–1.2)
BUN: 11 mg/dL (ref 6–23)
CHLORIDE: 100 meq/L (ref 96–112)
CO2: 26 mEq/L (ref 19–32)
CREATININE: 1.23 mg/dL (ref 0.50–1.35)
Calcium: 8.6 mg/dL (ref 8.4–10.5)
GFR calc Af Amer: 84 mL/min — ABNORMAL LOW (ref >90)
GFR calc non Af Amer: 73 mL/min — ABNORMAL LOW (ref >90)
Glucose, Bld: 185 mg/dL — ABNORMAL HIGH (ref 70–99)
Potassium: 3 mEq/L — ABNORMAL LOW (ref 3.7–5.3)
Sodium: 141 mEq/L (ref 137–147)
TOTAL PROTEIN: 7.1 g/dL (ref 6.0–8.3)

## 2014-08-26 LAB — TSH: TSH: 0.948 u[IU]/mL (ref 0.350–4.500)

## 2014-08-26 MED ORDER — POTASSIUM CHLORIDE CRYS ER 20 MEQ PO TBCR: 20.0000 meq | EXTENDED_RELEASE_TABLET | Freq: Every day | ORAL | Status: DC

## 2014-08-26 MED ORDER — SPIRONOLACTONE 25 MG PO TABS: 12.5000 mg | ORAL_TABLET | Freq: Every day | ORAL | Status: DC

## 2014-08-26 NOTE — Progress Notes (Signed)
Patient ID: Douglas [name], male   DOB: 26-Dec-1975, 38 y.o.   MRN: [id number]  EP: Dr Graciela Husbands PCP: Trying to find one Primary Cardiologist: Dr. [name]  History of Present Illness: Douglas [name] is a 38 y.o. male with a h/o HTN, HL, obesity, NICM and chronic systolic heart failure. He is S/P Medtronic ICD due to NICM. Management has been complicated by severe non-compliance.   August 2012 CPX VO2 20  Underwent cath in 2006 which showed normal cors with EF 20-25%.   EF had recovered in late 2013 but has again fallen to 15% per echo 01/20/13.  He was admitted to [facility] 02/21/13 from clinic for low output symptoms and volume overload.  He required IV lasix and metolazone.  He diuresed 19 pounds with discharge weight of 254 pounds.  He had runs of VT therefore he was started on amiodarone per EP and Lifevest was placed at discharge.  He underwent RHC on 02/22/13.  He has not required metolazone.   RHC 02/22/13  RA = 12  RV = 49/6/15  PA = 54/29 (41)  PCW = 28 (v= 40)  Fick cardiac output/index = 4.3/1.7  PVR = 2.6 Woods  SVR = 1580 dynes  FA sat = 98%  PA sat = 53%, 54%  Non-invasive BP = 124/86 (97)  RHC 05/03/13 RA = 2  RV = 29/2/2  PA = 26/10 (17)  PCW = 3  Fick cardiac output/index = 6.1/2.3  PVR = 2.3  FA sat = 93%  PA sat = 65%, 67%  ECHO 06/25/13 EF 20-25% [facility] 05/01/14 EF 35-40%, moderately dilated LV, mild LVH, diffuse hypokinesis  Follow up: Returns for f/u. Doing well. Short of breath walking up a flight of steps.  Ok on flat ground.  Walks for exercise. Weight stable.  Trying to follow low salt diet and reports drinking less than 2L a day.   Optivol: Fluid index below threshold, thoracic impedance stable.   Labs (10/15/13): K 3.9 Creatinine 0.99 Labs (3/15): K 4, creatinine 1.04, TSH normal, LFTs, normal  SH: lives with his Mom. No ETOH, smokes rarely  FH: Mom has CAD.    ROS:  Please see the history of present illness.  He denies fevers, chills, melena,  hematochezia.  All other systems reviewed and negative.  Past Medical History  Diagnosis Date  . Hypertension   . Systolic CHF, chronic   . Obesity   . Nonischemic cardiomyopathy     a.  echo 4/06: EF 30%, mild to mod MR, mild RAE, inf HK, lat HK , ant HK;    b.  cath 4/06: no CAD, EF 20-25%  . ED (erectile dysfunction)   . NSVT (nonsustained ventricular tachycardia)     eval by EP in past; no ICD candidate due to NYHA 1 symptoms  . Automatic implantable cardioverter-defibrillator in situ   . Exertional shortness of breath     Current Outpatient Prescriptions  Medication Sig Dispense Refill  . amiodarone (PACERONE) 200 MG tablet TAKE 1 TABLET BY MOUTH DAILY  30 tablet  2  . carvedilol (COREG) 12.5 MG tablet Take 1 tablet (12.5 mg total) by mouth 2 (two) times daily with a meal.  60 tablet  3  . hydrALAZINE (APRESOLINE) 25 MG tablet Take 1 tablet (25 mg total) by mouth 3 (three) times daily.  90 tablet  1  . isosorbide mononitrate (IMDUR) 30 MG 24 hr tablet Take 1 tablet (30 mg total) by mouth daily.  30  tablet  3  . lisinopril (PRINIVIL,ZESTRIL) 5 MG tablet Take 1 tablet (5 mg total) by mouth 2 (two) times daily.  60 tablet  3  . metolazone (ZAROXOLYN) 2.5 MG tablet Take 1 tablet (2.5 mg total) by mouth as needed.  8 tablet  3  . potassium chloride SA (K-DUR,KLOR-CON) 20 MEQ tablet Take 1 tablet (20 mEq total) by mouth daily.  30 tablet  3  . torsemide (DEMADEX) 20 MG tablet Take 3 tablets (60 mg total) by mouth 2 (two) times daily.  180 tablet  3  . spironolactone (ALDACTONE) 25 MG tablet Take 0.5 tablets (12.5 mg total) by mouth daily.  15 tablet  3   No current facility-administered medications for this encounter.    Allergies  Allergen Reactions  . Penicillins Other (See Comments)    Unknown childhood reaction     Filed Vitals:   08/26/14 0857  BP: 136/88  Pulse: 79  Weight: 335 lb 8 oz (152.182 kg)  SpO2: 98%    PHYSICAL EXAM: General: Well nourished, well  developed, in no acute distress  HEENT: normal Neck: JVP hard to tell due thick neck but does not appear elevated.Carotids 2+ bilaterally.No Bruits  Cardiac:  PMI laterally displaced. Regular rate and rhythm,  Lungs:  clear to auscultation bilaterally, no wheezing, rhonchi or rales Ab: obese. Soft NT.  Non-distended.  No bruits. Ext warm. no cyanosis or clubbing. Trace lower extremity edema.  Skin: warm and dry Psych:normal affect. CNII-XII grossly intact  1) Chronic systolic HF: Nonischemic cardiomyopathy, EF 25-30% (06/2013) -> 35-40% (05/01/14) S/P Medtronic ICD 08/2013. Overall he is doing well. NYHA II. Volume status stable, confirmed by Optivol today.  - BMET today.  - Continue Torsemide 60 mg bid with KCl 20 bid.  - Continue Coreg 12.5 bid, hydralazine/Imdur, and lisinopril 5 bid.  - Add spironolactone 12.5 mg daily with BMET in 2 wks.  - Reinforced daily weights, low salt food choices, and limiting fluid intake to < 2 liters per day  2) HTN: Well-controlled BP.  3) NSVT: Continues amiodarone 200 mg daily. Will check TSH and LFTs today, eye exam yearly.      Follow up in 4 months  Marca Ancona MD  08/26/2014 1:26 PM

## 2014-08-26 NOTE — Patient Instructions (Signed)
Decrease Potassium to 20 meq DAILY  Start Spironolactone 12.5 mg (1/2 tab) Daily  Labs today  Labs in 2 weeks (bmet)  We will contact you in 4 months to schedule your next appointment.

## 2014-08-27 ENCOUNTER — Ambulatory Visit (INDEPENDENT_AMBULATORY_CARE_PROVIDER_SITE_OTHER): Payer: Medicaid Other | Admitting: Internal Medicine

## 2014-08-27 ENCOUNTER — Encounter: Payer: Self-pay | Admitting: Internal Medicine

## 2014-08-27 VITALS — BP 132/84 | HR 90 | Ht 76.0 in | Wt 335.8 lb

## 2014-08-27 DIAGNOSIS — I429 Cardiomyopathy, unspecified: Secondary | ICD-10-CM

## 2014-08-27 DIAGNOSIS — I428 Other cardiomyopathies: Secondary | ICD-10-CM

## 2014-08-27 DIAGNOSIS — Z95810 Presence of automatic (implantable) cardiac defibrillator: Secondary | ICD-10-CM

## 2014-08-27 DIAGNOSIS — I5022 Chronic systolic (congestive) heart failure: Secondary | ICD-10-CM

## 2014-08-27 DIAGNOSIS — I472 Ventricular tachycardia: Secondary | ICD-10-CM

## 2014-08-27 DIAGNOSIS — I4729 Other ventricular tachycardia: Secondary | ICD-10-CM

## 2014-08-27 LAB — MDC_IDC_ENUM_SESS_TYPE_INCLINIC
Battery Remaining Longevity: 132 mo
Date Time Interrogation Session: 20151013180731
HIGH POWER IMPEDANCE MEASURED VALUE: 55 Ohm
HighPow Impedance: 228 Ohm
HighPow Impedance: 67 Ohm
Lead Channel Pacing Threshold Pulse Width: 0.4 ms
Lead Channel Sensing Intrinsic Amplitude: 14.625 mV
Lead Channel Setting Pacing Amplitude: 2.5 V
Lead Channel Setting Sensing Sensitivity: 0.3 mV
MDC IDC MSMT BATTERY VOLTAGE: 3.04 V
MDC IDC MSMT LEADCHNL RV IMPEDANCE VALUE: 437 Ohm
MDC IDC MSMT LEADCHNL RV PACING THRESHOLD AMPLITUDE: 0.75 V
MDC IDC MSMT LEADCHNL RV SENSING INTR AMPL: 12.25 mV
MDC IDC SET LEADCHNL RV PACING PULSEWIDTH: 0.4 ms
MDC IDC SET ZONE DETECTION INTERVAL: 300 ms
MDC IDC SET ZONE DETECTION INTERVAL: 360 ms
MDC IDC STAT BRADY RV PERCENT PACED: 0.01 %
Zone Setting Detection Interval: 370 ms

## 2014-08-27 NOTE — Patient Instructions (Addendum)
Your physician recommends that you continue on your current medications as directed. Please refer to the Current Medication list given to you today.  Remote monitoring is used to monitor your Pacemaker of ICD from home. This monitoring reduces the number of office visits required to check your device to one time per year. It allows [place] to keep an eye on the functioning of your device to ensure it is working properly. You are scheduled for a device check from home on 11/26/14. You may send your transmission at any time that day. If you have a wireless device, the transmission will be sent automatically. After your physician reviews your transmission, you will receive a postcard with your next transmission date.  Your physician wants you to follow-up in: 1 year with Dr. Graciela Husbands.  You will receive a reminder letter in the mail two months in advance. If you don't receive a letter, please call our office to schedule the follow-up appointment.

## 2014-08-27 NOTE — Progress Notes (Signed)
Patient Care Team: Pcp Not In System as PCP - General   HPI  Douglas [name] is a 38 y.o. male Seen in followup for ICD implanted 10/14 for primary prevention for NICM  Followed by CHF clinic  Trying to lose weight  Past Medical History  Diagnosis Date  . Hypertension   . Systolic CHF, chronic   . Obesity   . Nonischemic cardiomyopathy     a.  echo 4/06: EF 30%, mild to mod MR, mild RAE, inf HK, lat HK , ant HK;    b.  cath 4/06: no CAD, EF 20-25%  . ED (erectile dysfunction)   . NSVT (nonsustained ventricular tachycardia)     eval by EP in past; no ICD candidate due to NYHA 1 symptoms  . Automatic implantable cardioverter-defibrillator in situ   . Exertional shortness of breath     Past Surgical History  Procedure Laterality Date  . Multiple extractions with alveoloplasty N/A 01/26/2013    Procedure:  EXTRACION  TOOTH # 19 WITH ALVEOLOPLASTY;  Surgeon: Charlynne Pander, DDS;  Location: [facility] OR;  Service: Oral Surgery;  Laterality: N/A;  . Cardiac defibrillator placement  08/23/2013  . Cardiac catheterization  09/2011; 02/2013; 04/2013    Current Outpatient Prescriptions  Medication Sig Dispense Refill  . amiodarone (PACERONE) 200 MG tablet TAKE 1 TABLET BY MOUTH DAILY  30 tablet  2  . carvedilol (COREG) 12.5 MG tablet Take 1 tablet (12.5 mg total) by mouth 2 (two) times daily with a meal.  60 tablet  3  . hydrALAZINE (APRESOLINE) 25 MG tablet Take 1 tablet (25 mg total) by mouth 3 (three) times daily.  90 tablet  1  . isosorbide mononitrate (IMDUR) 30 MG 24 hr tablet Take 1 tablet (30 mg total) by mouth daily.  30 tablet  3  . lisinopril (PRINIVIL,ZESTRIL) 5 MG tablet Take 1 tablet (5 mg total) by mouth 2 (two) times daily.  60 tablet  3  . metolazone (ZAROXOLYN) 2.5 MG tablet Take 1 tablet (2.5 mg total) by mouth as needed.  8 tablet  3  . potassium chloride SA (K-DUR,KLOR-CON) 20 MEQ tablet Take 1 tablet (20 mEq total) by mouth daily.  30 tablet  3  .  spironolactone (ALDACTONE) 25 MG tablet Take 0.5 tablets (12.5 mg total) by mouth daily.  15 tablet  3  . torsemide (DEMADEX) 20 MG tablet Take 3 tablets (60 mg total) by mouth 2 (two) times daily.  180 tablet  3   No current facility-administered medications for this visit.    Allergies  Allergen Reactions  . Penicillins Other (See Comments)    Unknown childhood reaction     Review of Systems negative except from HPI and PMH  Physical Exam BP 132/84  Pulse 90  Ht 6\' 4"  (1.93 m)  Wt 335 lb 12.8 oz (152.318 kg)  BMI 40.89 kg/m2 Well developed and well nourished in no acute distress HENT normal E scleral and icterus clear Neck Supple JVP flat; carotids brisk and full Clear to ausculation Device pocket well healed; without hematoma or erythema.  There is no tethering  *Regular rate and rhythm, no murmurs gallops or rub Soft with active bowel sounds No clubbing cyanosis  Edema Alert and oriented, grossly normal motor and sensory function Skin Warm and Dry    Assessment and  Plan  NICM  ICD  The patient's device was interrogated.  The information was reviewed. No changes were made in  the programming.      Obesity   Stable  We discussed the role of no carbs diet

## 2014-09-10 ENCOUNTER — Other Ambulatory Visit (HOSPITAL_COMMUNITY): Payer: Self-pay

## 2014-09-23 ENCOUNTER — Emergency Department (HOSPITAL_COMMUNITY)
Admission: EM | Admit: 2014-09-23 | Discharge: 2014-09-23 | Disposition: A | Discharge status: Home / Self Care | Payer: Medicaid Other | Attending: Emergency Medicine | Admitting: Emergency Medicine

## 2014-09-23 ENCOUNTER — Encounter (HOSPITAL_COMMUNITY): Payer: Self-pay

## 2014-09-23 DIAGNOSIS — E669 Obesity, unspecified: Secondary | ICD-10-CM | POA: Insufficient documentation

## 2014-09-23 DIAGNOSIS — Z87438 Personal history of other diseases of male genital organs: Secondary | ICD-10-CM | POA: Insufficient documentation

## 2014-09-23 DIAGNOSIS — I10 Essential (primary) hypertension: Secondary | ICD-10-CM | POA: Insufficient documentation

## 2014-09-23 DIAGNOSIS — B372 Candidiasis of skin and nail: Secondary | ICD-10-CM | POA: Diagnosis not present

## 2014-09-23 DIAGNOSIS — Z79899 Other long term (current) drug therapy: Secondary | ICD-10-CM | POA: Diagnosis not present

## 2014-09-23 DIAGNOSIS — Z9889 Other specified postprocedural states: Secondary | ICD-10-CM | POA: Diagnosis not present

## 2014-09-23 DIAGNOSIS — I5022 Chronic systolic (congestive) heart failure: Secondary | ICD-10-CM | POA: Diagnosis not present

## 2014-09-23 DIAGNOSIS — Z880 Allergy status to penicillin: Secondary | ICD-10-CM | POA: Diagnosis not present

## 2014-09-23 DIAGNOSIS — Z720 Tobacco use: Secondary | ICD-10-CM | POA: Insufficient documentation

## 2014-09-23 DIAGNOSIS — Z95810 Presence of automatic (implantable) cardiac defibrillator: Secondary | ICD-10-CM | POA: Insufficient documentation

## 2014-09-23 DIAGNOSIS — R21 Rash and other nonspecific skin eruption: Secondary | ICD-10-CM | POA: Diagnosis present

## 2014-09-23 LAB — CBG MONITORING, ED: GLUCOSE-CAPILLARY: 110 mg/dL — AB (ref 70–99)

## 2014-09-23 MED ORDER — CLOTRIMAZOLE-BETAMETHASONE 1-0.05 % EX CREA: 1.0000 "application " | TOPICAL_CREAM | Freq: Two times a day (BID) | CUTANEOUS | Status: DC

## 2014-09-23 MED ORDER — ACETAMINOPHEN-CODEINE #3 300-30 MG PO TABS: 1.0000 | ORAL_TABLET | Freq: Four times a day (QID) | ORAL | Status: DC | PRN

## 2014-09-23 NOTE — Discharge Instructions (Signed)

## 2014-09-23 NOTE — ED Notes (Signed)
Has a rash to his groin and under his abd. Has gotten worse in the past 2 weeks. Feels lumps under the skin.

## 2014-09-23 NOTE — ED Notes (Signed)
EDP aware of pts CBG.

## 2014-09-23 NOTE — ED Provider Notes (Signed)
CSN: [id number]     Arrival date & time 09/23/14  1148 History   First MD Initiated Contact with Patient 09/23/14 1520     Chief Complaint  Patient presents with  . Rash     (Consider location/radiation/quality/duration/timing/severity/associated sxs/prior Treatment) HPI The patient reports he's had a groin rash that has come and gone now for almost 7 months. Sometimes it'll clear up for a month or more to time and then come back and be very itchy. He reports the area will sweat and be moist which exacerbates the itching. He has tried some over-the-counter steroid cream and gotten temporary relief but it seems to come back again. He reports it is worse now than it has been in the past. He also notes that there are some small lumps under his skin in the hairbearing area of the pubis. He does not have other associated symptoms. He reports his blood sugar does get checked and he has not been diagnosed as having diabetes. He reports he does take medications for his heart and he has been on a number of different medications over time but does not associate these symptoms with any one of them. Past Medical History  Diagnosis Date  . Hypertension   . Systolic CHF, chronic   . Obesity   . Nonischemic cardiomyopathy     a.  echo 4/06: EF 30%, mild to mod MR, mild RAE, inf HK, lat HK , ant HK;    b.  cath 4/06: no CAD, EF 20-25%  . ED (erectile dysfunction)   . NSVT (nonsustained ventricular tachycardia)     eval by EP in past; no ICD candidate due to NYHA 1 symptoms  . Automatic implantable cardioverter-defibrillator in situ   . Exertional shortness of breath    Past Surgical History  Procedure Laterality Date  . Multiple extractions with alveoloplasty N/A 01/26/2013    Procedure:  EXTRACION  TOOTH # 19 WITH ALVEOLOPLASTY;  Surgeon: Charlynne Pander, DDS;  Location: [facility] OR;  Service: Oral Surgery;  Laterality: N/A;  . Cardiac defibrillator placement  08/23/2013  . Cardiac catheterization  09/2011;  02/2013; 04/2013   Family History  Problem Relation Age of Onset  . Coronary artery disease Mother 14    s/p PCI  . Lung cancer Father   . Diabetes type II Maternal Uncle   . Coronary artery disease Maternal Uncle    History  Substance Use Topics  . Smoking status: Current Every Day Smoker -- 0.12 packs/day for 8 years    Types: Cigarettes  . Smokeless tobacco: Never Used  . Alcohol Use: 0.0 oz/week     Comment: 08/23/2013  "rarely have a beer anymore; last beer was a long time ago"    Review of Systems  10 Systems reviewed and are negative for acute change except as noted in the HPI.   Allergies  Penicillins  Home Medications   Prior to Admission medications   Medication Sig Start Date End Date Taking? Authorizing Provider  acetaminophen-codeine (TYLENOL #3) 300-30 MG per tablet Take 1-2 tablets by mouth every 6 (six) hours as needed for moderate pain. 09/23/14   Arby Barrette, MD  amiodarone (PACERONE) 200 MG tablet TAKE 1 TABLET BY MOUTH DAILY 05/06/14   Dolores Patty, MD  carvedilol (COREG) 12.5 MG tablet Take 1 tablet (12.5 mg total) by mouth 2 (two) times daily with a meal. 05/01/14   Dolores Patty, MD  clotrimazole-betamethasone (LOTRISONE) cream Apply 1 application topically 2 (two) times  daily. 09/23/14   Arby Barrette, MD  hydrALAZINE (APRESOLINE) 25 MG tablet Take 1 tablet (25 mg total) by mouth 3 (three) times daily. 01/29/14   Aundria Rud, NP  isosorbide mononitrate (IMDUR) 30 MG 24 hr tablet Take 1 tablet (30 mg total) by mouth daily. 04/10/14   Dolores Patty, MD  lisinopril (PRINIVIL,ZESTRIL) 5 MG tablet Take 1 tablet (5 mg total) by mouth 2 (two) times daily. 04/10/14   Dolores Patty, MD  metolazone (ZAROXOLYN) 2.5 MG tablet Take 1 tablet (2.5 mg total) by mouth as needed. 08/17/13   Amy D Clegg, NP  potassium chloride SA (K-DUR,KLOR-CON) 20 MEQ tablet Take 1 tablet (20 mEq total) by mouth daily. 08/26/14   Laurey Morale, MD  spironolactone  (ALDACTONE) 25 MG tablet Take 0.5 tablets (12.5 mg total) by mouth daily. 08/26/14   Laurey Morale, MD  torsemide (DEMADEX) 20 MG tablet Take 3 tablets (60 mg total) by mouth 2 (two) times daily. 04/10/14   Bevelyn Buckles Bensimhon, MD   BP 138/64 mmHg  Pulse 65  Temp(Src) 98 F (36.7 C) (Oral)  Resp 18  Ht 6\' 4"  (1.93 m)  Wt 330 lb (149.687 kg)  BMI 40.19 kg/m2  SpO2 99% Physical Exam  Constitutional: He is oriented to person, place, and time.  The patient is obese but well in appearance. No acute distress.  HENT:  Head: Normocephalic and atraumatic.  Eyes: EOM are normal.  Cardiovascular: Normal rate, regular rhythm, normal heart sounds and intact distal pulses.   Pulmonary/Chest: Effort normal and breath sounds normal. No respiratory distress. He has no wheezes. He has no rales.  Abdominal: Soft.  Genitourinary:  Visual special of the entire groin area shows hyperpigmentation and induration of the skin in opposing regions. This includes his lower abdomen and his suprapubic area, both groin folds bilaterally. There are small less than half centimeter subcutaneous nodular feeling lesions approximately 3 or 4 of them in the suprapubic region of the hair bearing area. There is no overlying pustular lesion and no overlying appearance of cellulitis or fluctuance. The scrotum does not have any erythema or swelling associated with it the skin itself however is moderately thickened and hyperpigmented.  Musculoskeletal: Normal range of motion.  Neurological: He is alert and oriented to person, place, and time.  Skin: Skin is warm and dry.  Psychiatric: He has a normal mood and affect.    ED Course  Procedures (including critical care time) Labs Review Labs Reviewed  CBG MONITORING, ED - Abnormal; Notable for the following:    Glucose-Capillary 110 (*)    All other components within normal limits    Imaging Review No results found.   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 He does have hyperpigmentation and induration consistent with chronic itching. There does not appear to be any secondary infection.    , MD 09/23/14 1600

## 2014-10-01 ENCOUNTER — Encounter (HOSPITAL_COMMUNITY): Payer: Self-pay | Admitting: Cardiology

## 2014-10-01 ENCOUNTER — Ambulatory Visit (HOSPITAL_COMMUNITY)
Admission: RE | Admit: 2014-10-01 | Discharge: 2014-10-01 | Disposition: A | Discharge status: Home / Self Care | Payer: Medicaid Other | Source: Ambulatory Visit | Attending: Cardiology | Admitting: Cardiology

## 2014-10-01 DIAGNOSIS — I5022 Chronic systolic (congestive) heart failure: Secondary | ICD-10-CM | POA: Insufficient documentation

## 2014-10-01 LAB — BASIC METABOLIC PANEL
ANION GAP: 9 (ref 5–15)
BUN: 10 mg/dL (ref 6–23)
CALCIUM: 8.8 mg/dL (ref 8.4–10.5)
CO2: 27 mEq/L (ref 19–32)
Chloride: 104 mEq/L (ref 96–112)
Creatinine, Ser: 1.14 mg/dL (ref 0.50–1.35)
GFR calc Af Amer: >90 mL/min (ref >90)
GFR, EST NON AFRICAN AMERICAN: 80 mL/min — AB (ref >90)
Glucose, Bld: 178 mg/dL — ABNORMAL HIGH (ref 70–99)
Potassium: 4.1 mEq/L (ref 3.7–5.3)
SODIUM: 140 meq/L (ref 137–147)

## 2014-10-23 ENCOUNTER — Encounter (HOSPITAL_COMMUNITY): Payer: Self-pay | Admitting: Internal Medicine

## 2014-10-24 ENCOUNTER — Encounter (HOSPITAL_COMMUNITY): Payer: Self-pay | Admitting: Internal Medicine

## 2014-10-28 IMAGING — CR DG CHEST 2V
2 series · 2 of 2 positions shown · non-contrast
Comparison: 06/22/2012

CLINICAL DATA: Cough, congestion

CHEST - 2 VIEW

[w chest pa]
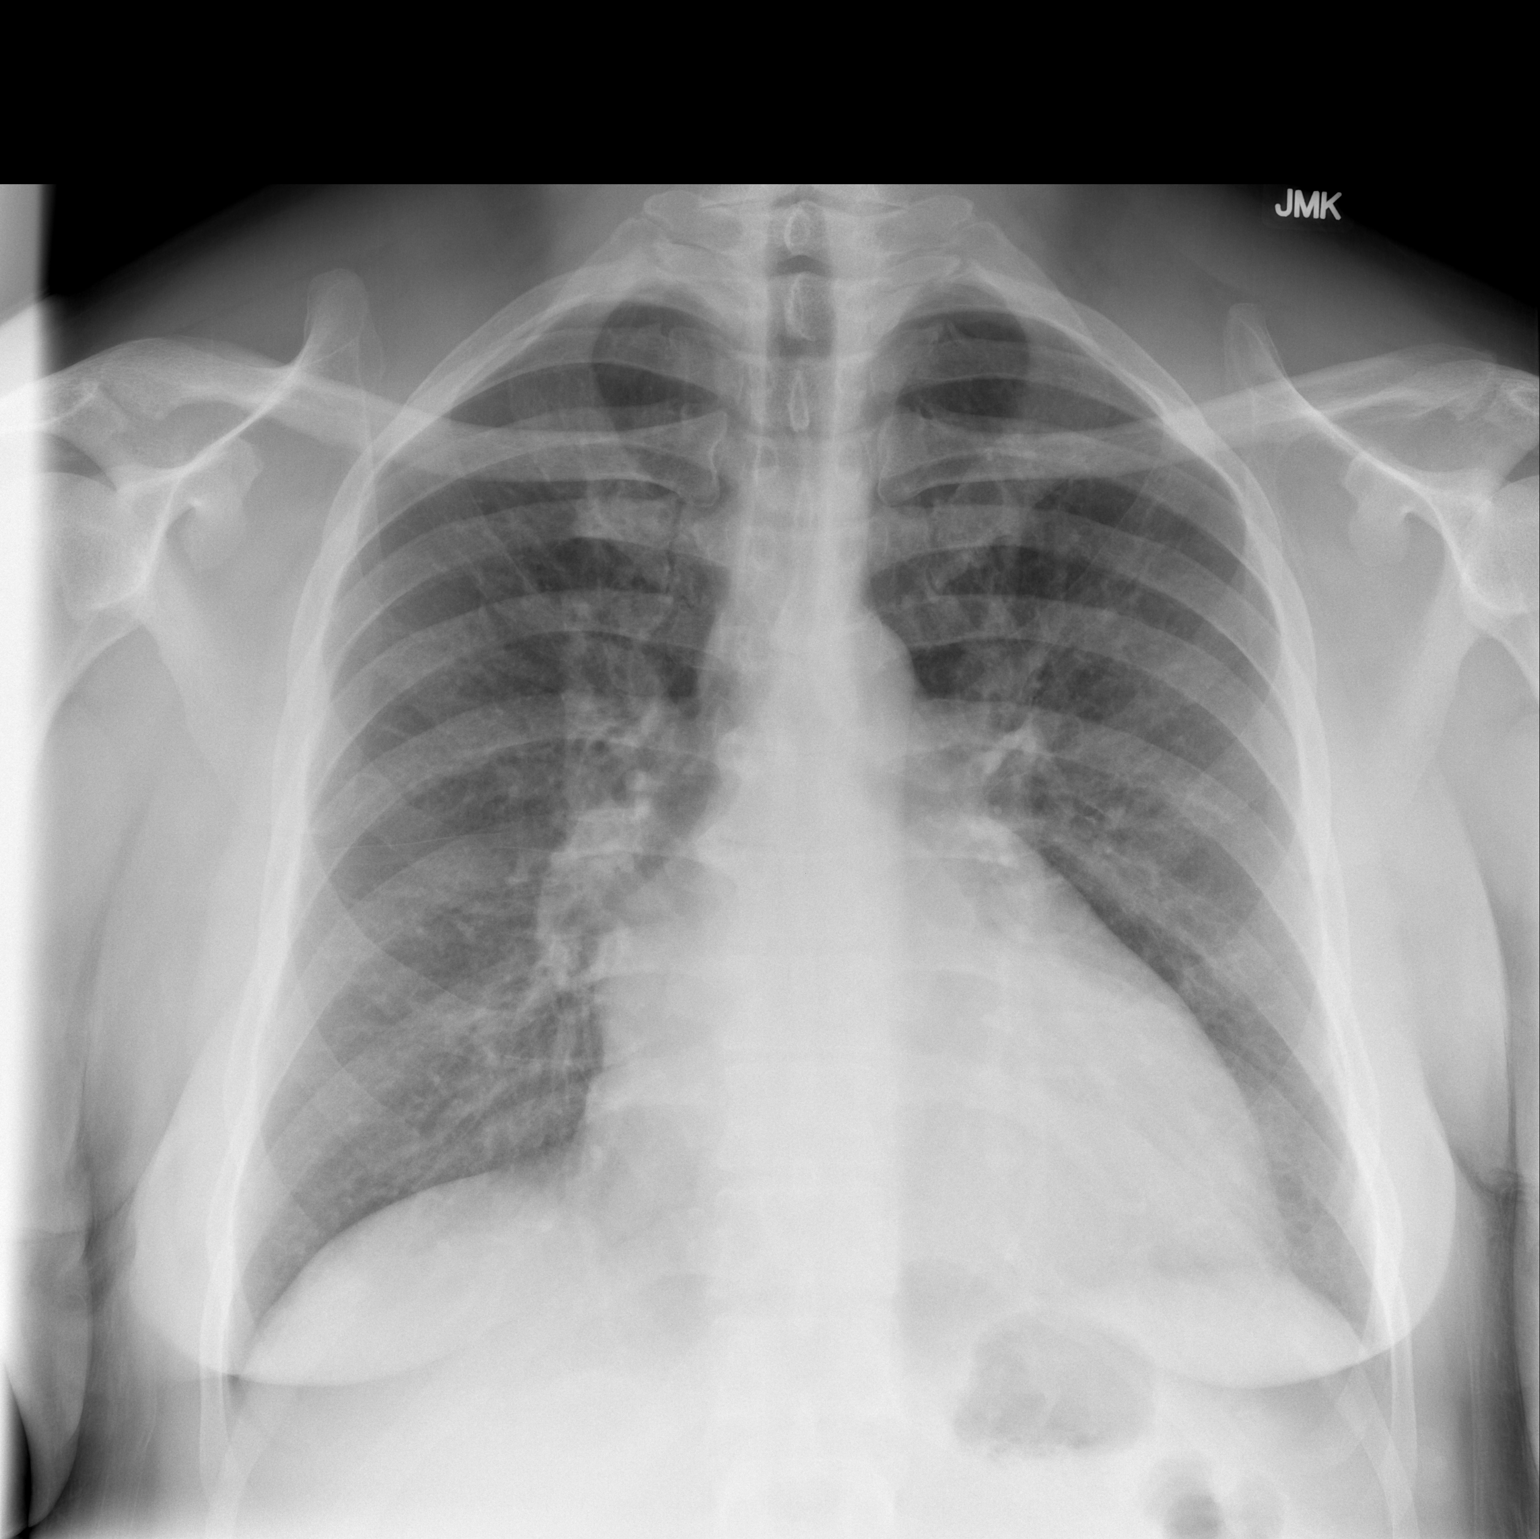

[w chest lat]
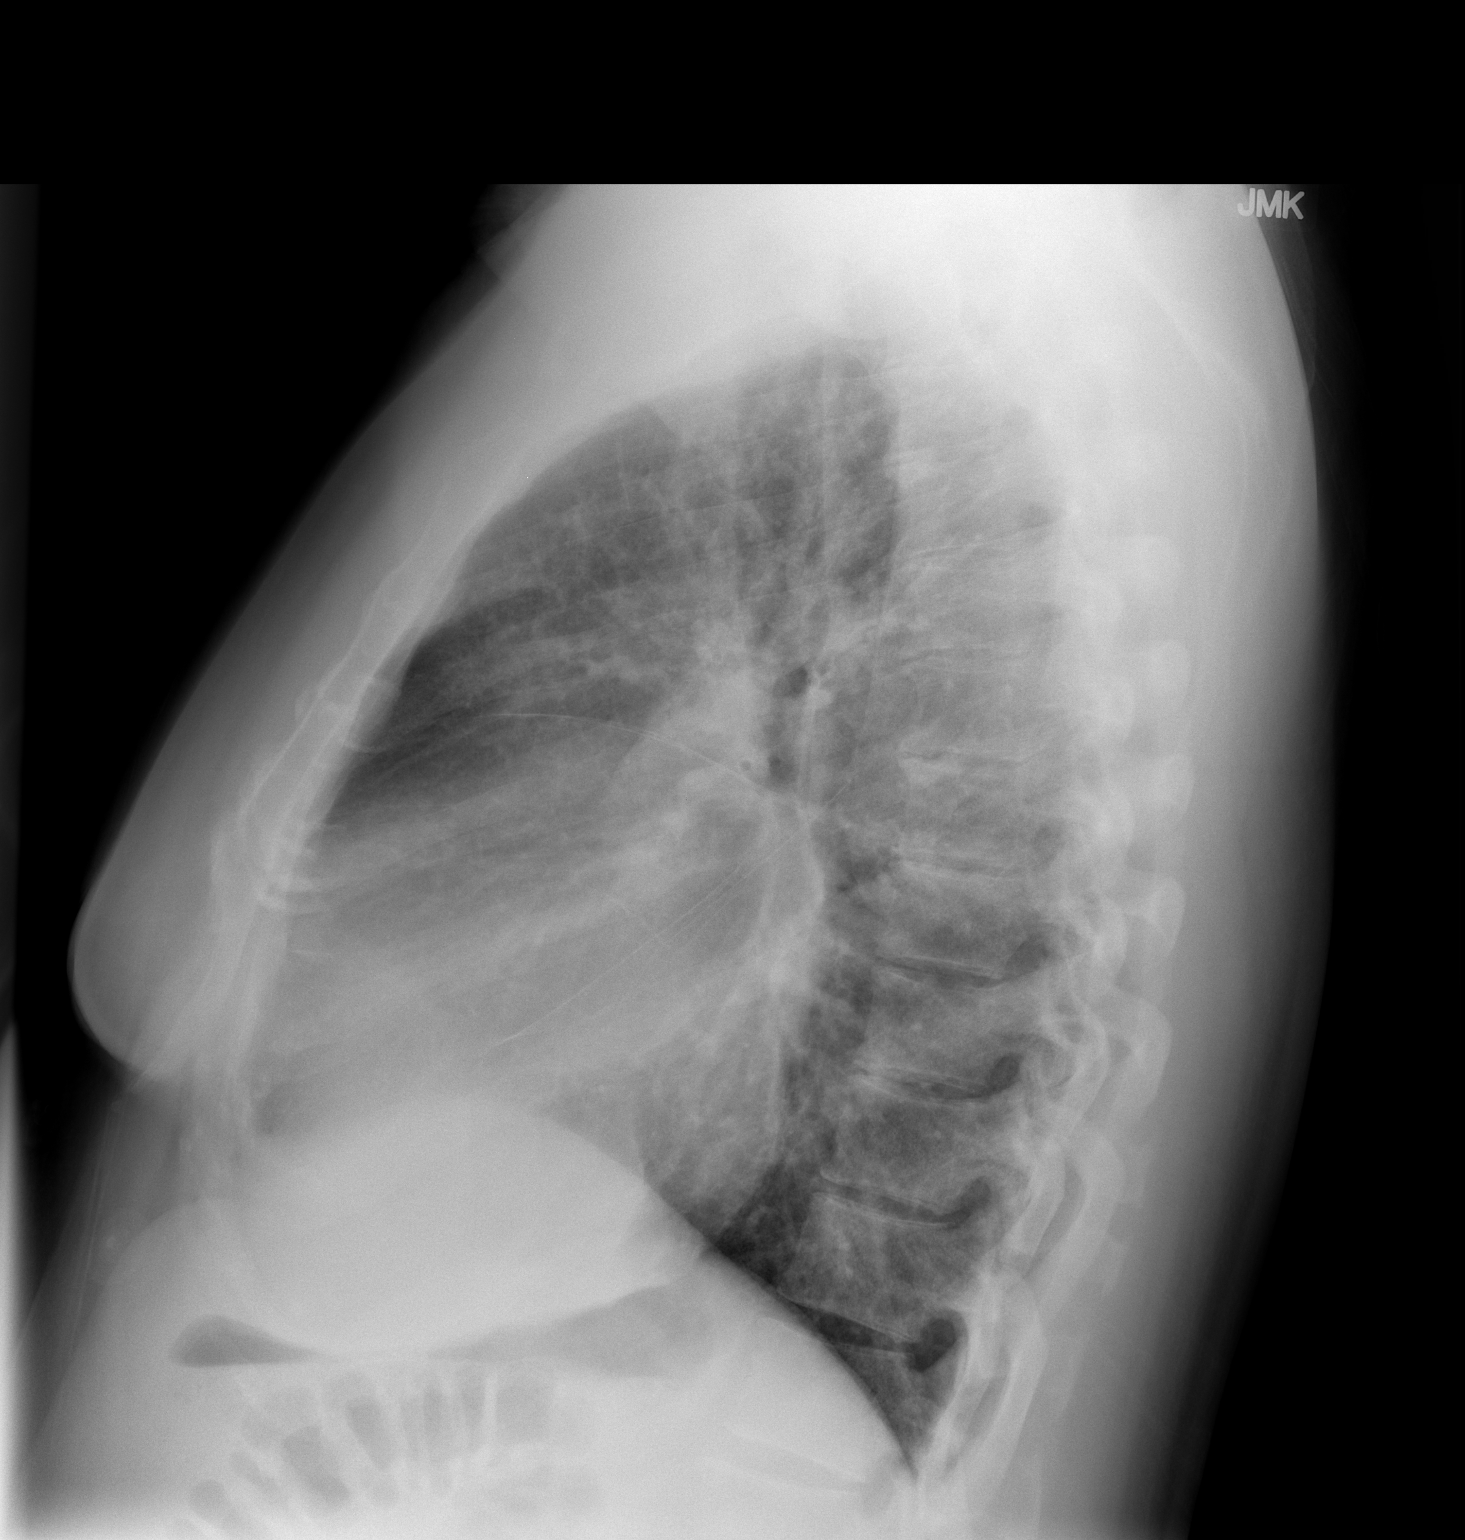

[2 of 2 positions shown; findings below may reference images not displayed]

FINDINGS: Cardiomegaly again noted.  No acute infiltrate or pleural
effusion.  Central mild bronchitic changes.  Bony thorax is stable.
IMPRESSION: No acute infiltrate or pleural effusion.  Central mild bronchitic
changes.

## 2014-11-26 ENCOUNTER — Ambulatory Visit (INDEPENDENT_AMBULATORY_CARE_PROVIDER_SITE_OTHER): Payer: Medicaid Other | Admitting: *Deleted

## 2014-11-26 DIAGNOSIS — I428 Other cardiomyopathies: Secondary | ICD-10-CM

## 2014-11-26 DIAGNOSIS — I429 Cardiomyopathy, unspecified: Secondary | ICD-10-CM

## 2014-11-26 DIAGNOSIS — I5022 Chronic systolic (congestive) heart failure: Secondary | ICD-10-CM

## 2014-11-26 LAB — MDC_IDC_ENUM_SESS_TYPE_REMOTE
Brady Statistic RV Percent Paced: 0.01 %
HighPow Impedance: 56 Ohm
HighPow Impedance: 68 Ohm
Lead Channel Pacing Threshold Amplitude: 0.625 V
Lead Channel Sensing Intrinsic Amplitude: 10.25 mV
Lead Channel Setting Pacing Pulse Width: 0.4 ms
MDC IDC MSMT BATTERY REMAINING LONGEVITY: 130 mo
MDC IDC MSMT BATTERY VOLTAGE: 3.03 V
MDC IDC MSMT LEADCHNL RV IMPEDANCE VALUE: 342 Ohm
MDC IDC MSMT LEADCHNL RV IMPEDANCE VALUE: 437 Ohm
MDC IDC MSMT LEADCHNL RV PACING THRESHOLD PULSEWIDTH: 0.4 ms
MDC IDC MSMT LEADCHNL RV SENSING INTR AMPL: 10.25 mV
MDC IDC SESS DTM: 20160112093824
MDC IDC SET LEADCHNL RV PACING AMPLITUDE: 2.5 V
MDC IDC SET LEADCHNL RV SENSING SENSITIVITY: 0.3 mV
MDC IDC SET ZONE DETECTION INTERVAL: 300 ms
Zone Setting Detection Interval: 360 ms
Zone Setting Detection Interval: 370 ms

## 2014-11-26 NOTE — Progress Notes (Signed)
ICD remote received. Sensing, impedances, auto capture threshold consistent with previous device measurements. Histograms appropriate for patient and level of activity. No ventricular arrhythmias recorded. All other diagnostic data reviewed---OptiVol trending towards saturation but not yet at threshold. Real time EGM demonstrates appropriate sensing. Estimated longevity 10.70yrs. Carelink 02/25/15 & ROV w/ SK 10/16.

## 2014-11-30 ENCOUNTER — Emergency Department (HOSPITAL_COMMUNITY): Payer: Medicaid Other

## 2014-11-30 ENCOUNTER — Observation Stay (HOSPITAL_COMMUNITY)
Admission: EM | Admit: 2014-11-30 | Discharge: 2014-12-01 | Disposition: A | Discharge status: Home / Self Care | Payer: Medicaid Other | Attending: Internal Medicine | Admitting: Internal Medicine

## 2014-11-30 ENCOUNTER — Encounter (HOSPITAL_COMMUNITY): Payer: Self-pay | Admitting: Emergency Medicine

## 2014-11-30 DIAGNOSIS — Y831 Surgical operation with implant of artificial internal device as the cause of abnormal reaction of the patient, or of later complication, without mention of misadventure at the time of the procedure: Secondary | ICD-10-CM | POA: Diagnosis not present

## 2014-11-30 DIAGNOSIS — Z23 Encounter for immunization: Secondary | ICD-10-CM | POA: Diagnosis not present

## 2014-11-30 DIAGNOSIS — E876 Hypokalemia: Secondary | ICD-10-CM

## 2014-11-30 DIAGNOSIS — I429 Cardiomyopathy, unspecified: Secondary | ICD-10-CM | POA: Insufficient documentation

## 2014-11-30 DIAGNOSIS — R079 Chest pain, unspecified: Secondary | ICD-10-CM | POA: Diagnosis not present

## 2014-11-30 DIAGNOSIS — I82409 Acute embolism and thrombosis of unspecified deep veins of unspecified lower extremity: Secondary | ICD-10-CM

## 2014-11-30 DIAGNOSIS — Z4502 Encounter for adjustment and management of automatic implantable cardiac defibrillator: Secondary | ICD-10-CM

## 2014-11-30 DIAGNOSIS — Z9889 Other specified postprocedural states: Secondary | ICD-10-CM | POA: Insufficient documentation

## 2014-11-30 DIAGNOSIS — I472 Ventricular tachycardia: Secondary | ICD-10-CM | POA: Diagnosis not present

## 2014-11-30 DIAGNOSIS — Z720 Tobacco use: Secondary | ICD-10-CM | POA: Diagnosis not present

## 2014-11-30 DIAGNOSIS — Z880 Allergy status to penicillin: Secondary | ICD-10-CM | POA: Diagnosis not present

## 2014-11-30 DIAGNOSIS — T82198A Other mechanical complication of other cardiac electronic device, initial encounter: Secondary | ICD-10-CM | POA: Diagnosis not present

## 2014-11-30 DIAGNOSIS — N529 Male erectile dysfunction, unspecified: Secondary | ICD-10-CM | POA: Diagnosis not present

## 2014-11-30 DIAGNOSIS — Z79899 Other long term (current) drug therapy: Secondary | ICD-10-CM | POA: Diagnosis not present

## 2014-11-30 DIAGNOSIS — I5020 Unspecified systolic (congestive) heart failure: Secondary | ICD-10-CM

## 2014-11-30 DIAGNOSIS — I10 Essential (primary) hypertension: Secondary | ICD-10-CM | POA: Diagnosis not present

## 2014-11-30 DIAGNOSIS — F1410 Cocaine abuse, uncomplicated: Secondary | ICD-10-CM

## 2014-11-30 DIAGNOSIS — I5022 Chronic systolic (congestive) heart failure: Secondary | ICD-10-CM

## 2014-11-30 DIAGNOSIS — R7989 Other specified abnormal findings of blood chemistry: Secondary | ICD-10-CM

## 2014-11-30 DIAGNOSIS — Z0389 Encounter for observation for other suspected diseases and conditions ruled out: Secondary | ICD-10-CM

## 2014-11-30 HISTORY — DX: Encounter for adjustment and management of automatic implantable cardiac defibrillator: Z45.02

## 2014-11-30 LAB — BASIC METABOLIC PANEL
Anion gap: 7 (ref 5–15)
BUN: 7 mg/dL (ref 6–23)
CALCIUM: 8.5 mg/dL (ref 8.4–10.5)
CHLORIDE: 100 meq/L (ref 96–112)
CO2: 28 mmol/L (ref 19–32)
Creatinine, Ser: 1.49 mg/dL — ABNORMAL HIGH (ref 0.50–1.35)
GFR calc non Af Amer: 57 mL/min — ABNORMAL LOW (ref >90)
GFR, EST AFRICAN AMERICAN: 66 mL/min — AB (ref >90)
Glucose, Bld: 186 mg/dL — ABNORMAL HIGH (ref 70–99)
Potassium: 3.3 mmol/L — ABNORMAL LOW (ref 3.5–5.1)
Sodium: 135 mmol/L (ref 135–145)

## 2014-11-30 LAB — CBC WITH DIFFERENTIAL/PLATELET
Basophils Absolute: 0 10*3/uL (ref 0.0–0.1)
Basophils Relative: 0 % (ref 0–1)
Eosinophils Absolute: 0.1 10*3/uL (ref 0.0–0.7)
Eosinophils Relative: 1 % (ref 0–5)
HCT: 42.4 % (ref 39.0–52.0)
HEMOGLOBIN: 15.2 g/dL (ref 13.0–17.0)
LYMPHS PCT: 29 % (ref 12–46)
Lymphs Abs: 2.7 10*3/uL (ref 0.7–4.0)
MCH: 30.2 pg (ref 26.0–34.0)
MCHC: 35.8 g/dL (ref 30.0–36.0)
MCV: 84.1 fL (ref 78.0–100.0)
Monocytes Absolute: 1 10*3/uL (ref 0.1–1.0)
Monocytes Relative: 11 % (ref 3–12)
NEUTROS ABS: 5.4 10*3/uL (ref 1.7–7.7)
Neutrophils Relative %: 59 % (ref 43–77)
PLATELETS: 237 10*3/uL (ref 150–400)
RBC: 5.04 MIL/uL (ref 4.22–5.81)
RDW: 13.5 % (ref 11.5–15.5)
WBC: 9.3 10*3/uL (ref 4.0–10.5)

## 2014-11-30 LAB — TROPONIN I
TROPONIN I: 0.3 ng/mL — AB (ref <0.031)
Troponin I: 0.31 ng/mL — ABNORMAL HIGH (ref <0.031)

## 2014-11-30 LAB — MAGNESIUM: Magnesium: 1.6 mg/dL (ref 1.5–2.5)

## 2014-11-30 LAB — PROTIME-INR
INR: 1 (ref 0.00–1.49)
PROTHROMBIN TIME: 13.3 s (ref 11.6–15.2)

## 2014-11-30 LAB — I-STAT TROPONIN, ED: Troponin i, poc: 0.15 ng/mL (ref 0.00–0.08)

## 2014-11-30 MED ORDER — MAGNESIUM SULFATE 2 GM/50ML IV SOLN
2.0000 g | Freq: Once | INTRAVENOUS | Status: AC
  Administered 2014-11-30: 2 g via INTRAVENOUS
  Filled 2014-11-30: qty 50

## 2014-11-30 MED ORDER — SPIRONOLACTONE 25 MG PO TABS
12.5000 mg | ORAL_TABLET | Freq: Every day | ORAL | Status: DC
  Administered 2014-11-30 – 2014-12-01 (×2): 12.5 mg via ORAL
  Filled 2014-11-30 (×2): qty 1

## 2014-11-30 MED ORDER — ISOSORBIDE MONONITRATE ER 30 MG PO TB24
30.0000 mg | ORAL_TABLET | Freq: Every day | ORAL | Status: DC
  Administered 2014-11-30 – 2014-12-01 (×2): 30 mg via ORAL
  Filled 2014-11-30 (×2): qty 1

## 2014-11-30 MED ORDER — POTASSIUM CHLORIDE CRYS ER 20 MEQ PO TBCR
40.0000 meq | EXTENDED_RELEASE_TABLET | Freq: Two times a day (BID) | ORAL | Status: AC
  Administered 2014-11-30 (×2): 40 meq via ORAL
  Filled 2014-11-30 (×2): qty 2

## 2014-11-30 MED ORDER — HYDRALAZINE HCL 25 MG PO TABS
25.0000 mg | ORAL_TABLET | Freq: Three times a day (TID) | ORAL | Status: DC
Start: 2014-11-30 — End: 2014-12-01
  Administered 2014-11-30 – 2014-12-01 (×4): 25 mg via ORAL
  Filled 2014-11-30 (×4): qty 1

## 2014-11-30 MED ORDER — AMIODARONE HCL 200 MG PO TABS
200.0000 mg | ORAL_TABLET | Freq: Every day | ORAL | Status: DC
  Administered 2014-11-30 – 2014-12-01 (×2): 200 mg via ORAL
  Filled 2014-11-30 (×2): qty 1

## 2014-11-30 MED ORDER — SODIUM CHLORIDE 0.9 % IJ SOLN
3.0000 mL | Freq: Two times a day (BID) | INTRAMUSCULAR | Status: DC
  Administered 2014-11-30 – 2014-12-01 (×3): 3 mL via INTRAVENOUS

## 2014-11-30 MED ORDER — ENOXAPARIN SODIUM 40 MG/0.4ML ~~LOC~~ SOLN
40.0000 mg | SUBCUTANEOUS | Status: DC
  Administered 2014-11-30: 40 mg via SUBCUTANEOUS
  Filled 2014-11-30: qty 0.4

## 2014-11-30 MED ORDER — TORSEMIDE 20 MG PO TABS
60.0000 mg | ORAL_TABLET | Freq: Two times a day (BID) | ORAL | Status: DC
  Administered 2014-11-30 – 2014-12-01 (×2): 60 mg via ORAL
  Filled 2014-11-30 (×2): qty 3

## 2014-11-30 MED ORDER — OFF THE BEAT BOOK
Freq: Once | Status: AC
  Administered 2014-11-30: 18:00:00
  Filled 2014-11-30: qty 1

## 2014-11-30 MED ORDER — CARVEDILOL 12.5 MG PO TABS
12.5000 mg | ORAL_TABLET | Freq: Two times a day (BID) | ORAL | Status: DC
  Administered 2014-11-30 – 2014-12-01 (×2): 12.5 mg via ORAL
  Filled 2014-11-30 (×2): qty 1

## 2014-11-30 MED ORDER — INFLUENZA VAC SPLIT QUAD 0.5 ML IM SUSY
0.5000 mL | PREFILLED_SYRINGE | INTRAMUSCULAR | Status: AC
  Administered 2014-12-01: 0.5 mL via INTRAMUSCULAR
  Filled 2014-11-30: qty 0.5

## 2014-11-30 NOTE — H&P (Signed)
Date: 11/30/2014               Patient Name:  Douglas [name] MRN: [id number]  DOB: 1976/08/05 Age / Sex: 39 y.o., male   PCP: Pcp Not In System         Medical Service: Internal Medicine Teaching Service         Attending Physician: Dr. Inez Catalina, MD    First Contact: Dr. Eleonore Chiquito Pager: [phone]  Second Contact: Dr. Lauris Chroman Pager: [phone]       After Hours (After 5p/  First Contact Pager: [phone]  weekends / holidays): Second Contact Pager: [phone]   Chief Complaint: my defibrillator went off early this morning  History of Present Illness: [name] is a 39 year old man who has a history of heart failure due to NICM (cath showing no CAD at HF diagnosis in 2006, repeat cath 02/2013 significant biventricular HF with elevated filling pressures and low output, last echo 2015 EF 35-40%, now s/p ICD implantation in 08/2013), HTN, obesity, ED and cocaine use who presents after his ICD fired twice at 2 am this morning. During the day yesterday, he had several beers and used cocaine; he denies any other elicit drug use or any non-prescribed medication ingestion (including ED medications). Later, he was having an "intimate moment" with his girlfriend when his ICD fired. He was standing at the time, and then noted the ICD to fire a second time. He noted no chest pain, palpitations, shortness of breath, light-headedness, pre-syncope or loss of consciousness before, during or after the episode. His ICD has never before fired.  He has a history of severe non-compliance that has complicated his cardiac care, but more recently, it appears that he has been taking his medications as prescribed and putting forth an effort to lose weight.  Meds: No current facility-administered medications for this encounter.   Current Outpatient Prescriptions  Medication Sig Dispense Refill  . amiodarone (PACERONE) 200 MG tablet TAKE 1 TABLET BY MOUTH DAILY 30 tablet 2  . carvedilol (COREG)  12.5 MG tablet Take 1 tablet (12.5 mg total) by mouth 2 (two) times daily with a meal. 60 tablet 3  . clotrimazole-betamethasone (LOTRISONE) cream Apply 1 application topically 2 (two) times daily. 45 g 0  . hydrALAZINE (APRESOLINE) 25 MG tablet Take 1 tablet (25 mg total) by mouth 3 (three) times daily. 90 tablet 1  . isosorbide mononitrate (IMDUR) 30 MG 24 hr tablet Take 1 tablet (30 mg total) by mouth daily. 30 tablet 3  . lisinopril (PRINIVIL,ZESTRIL) 5 MG tablet Take 1 tablet (5 mg total) by mouth 2 (two) times daily. 60 tablet 3  . metolazone (ZAROXOLYN) 2.5 MG tablet Take 1 tablet (2.5 mg total) by mouth as needed. 8 tablet 3  . potassium chloride SA (K-DUR,KLOR-CON) 20 MEQ tablet Take 1 tablet (20 mEq total) by mouth daily. 30 tablet 3  . spironolactone (ALDACTONE) 25 MG tablet Take 0.5 tablets (12.5 mg total) by mouth daily. 15 tablet 3  . torsemide (DEMADEX) 20 MG tablet Take 3 tablets (60 mg total) by mouth 2 (two) times daily. 180 tablet 3  . acetaminophen-codeine (TYLENOL #3) 300-30 MG per tablet Take 1-2 tablets by mouth every 6 (six) hours as needed for moderate pain. (Patient not taking: Reported on 11/30/2014) 15 tablet 0    Allergies: Allergies as of 11/30/2014 - Review Complete 11/30/2014  Allergen Reaction Noted  . Penicillins Other (See Comments) 03/03/2011   Past Medical  History  Diagnosis Date  . Hypertension   . Systolic CHF, chronic   . Obesity   . Nonischemic cardiomyopathy     a.  echo 4/06: EF 30%, mild to mod MR, mild RAE, inf HK, lat HK , ant HK;    b.  cath 4/06: no CAD, EF 20-25%  . ED (erectile dysfunction)   . NSVT (nonsustained ventricular tachycardia)     eval by EP in past; no ICD candidate due to NYHA 1 symptoms  . Automatic implantable cardioverter-defibrillator in situ   . Exertional shortness of breath    Past Surgical History  Procedure Laterality Date  . Multiple extractions with alveoloplasty N/A 01/26/2013    Procedure:  EXTRACION  TOOTH #  19 WITH ALVEOLOPLASTY;  Surgeon: Charlynne Pander, DDS;  Location: [facility] OR;  Service: Oral Surgery;  Laterality: N/A;  . Cardiac defibrillator placement  08/23/2013  . Cardiac catheterization  09/2011; 02/2013; 04/2013  . Left and right heart catheterization with coronary angiogram N/A 09/20/2011    Procedure: LEFT AND RIGHT HEART CATHETERIZATION WITH CORONARY ANGIOGRAM;  Surgeon: Dolores Patty, MD;  Location: [facility] CATH LAB;  Service: Cardiovascular;  Laterality: N/A;  . Right heart catheterization N/A 02/22/2013    Procedure: RIGHT HEART CATH;  Surgeon: Dolores Patty, MD;  Location: [facility] CATH LAB;  Service: Cardiovascular;  Laterality: N/A;  . Right heart catheterization N/A 05/03/2013    Procedure: RIGHT HEART CATH;  Surgeon: Dolores Patty, MD;  Location: [facility] CATH LAB;  Service: Cardiovascular;  Laterality: N/A;  . Implantable cardioverter defibrillator implant N/A 08/23/2013    Procedure: IMPLANTABLE CARDIOVERTER DEFIBRILLATOR IMPLANT;  Surgeon: Duke Salvia, MD;  Location: [facility] CATH LAB;  Service: Cardiovascular;  Laterality: N/A;   Family History  Problem Relation Age of Onset  . Coronary artery disease Mother 28    s/p PCI  . Lung cancer Father   . Diabetes type II Maternal Uncle   . Coronary artery disease Maternal Uncle; s/p 4-bypass   No history of sudden cardiac death  History   Social History  . Marital Status: Legally Separated    Spouse Name: N/A    Number of Children: 5  . Years of Education: N/A   Occupational History  . unemployed    Social History Main Topics  . Smoking status: Current Every Day Smoker -- 0.12 packs/day for 8 years    Types: Cigarettes  . Smokeless tobacco: Never Used  . Alcohol Use: 0.0 oz/week     Comment: 08/23/2013  "rarely have a beer anymore; last beer was a long time ago"  . Drug Use: Yes    Special: Cocaine     Comment: 11/30/14 today  . Sexual Activity: Not Currently   Other Topics Concern  . Not on file   Social History  Narrative   Review of Systems: General: recent cold, otherwise has been feeling well, weight last taken 1 week ago was 330 lbs Skin: no rashes or lesions HEENT: recent cold now resolving, no sore throat, no blurred vision Cardiac: no chest pain, no palpitations, no pain at site of ICD Respiratory: no shortness of breath, no wheezes GI: no changes in BMs, no abdominal pain Urinary: no dysuria or hematuria Msk: no joint swelling or pain, small amount of lower extremity swelling at baseline that resolves with moving around during the day Psychiatric: no recent symptoms of depression or anxiety  Physical Exam: Blood pressure 137/78, pulse 98, temperature 98.2 F (36.8 C), temperature source  Oral, resp. rate 18, height 6\' 4"  (1.93 m), weight 330 lb (149.687 kg), SpO2 96 %. Appearance: in NAD, resting in ED bed at 45 degree angle watching TV HEENT: AT/[place], no signs of trauma, PERRL, EOMi, no lymphadenopathy Heart: RRR, normal S1S2, no murmurs, ICD site nontender to palpation, well-healed scar over it with no  Lungs: CTAB including bases, no wheezes Abdomen: obese abdomen, BS+, soft, nontender, no organomegaly Extremities: trace edema in BLE Neurologic: A&Ox3, grossly intact Skin: no rashes or lesions  Lab results: Basic Metabolic Panel:  Recent Labs  55/97/41 0513  NA 135  K 3.3*  CL 100  CO2 28  GLUCOSE 186*  BUN 7  CREATININE 1.49*  CALCIUM 8.5  MG 1.6   CBC:  Recent Labs  11/30/14 0513  WBC 9.3  NEUTROABS 5.4  HGB 15.2  HCT 42.4  MCV 84.1  PLT 237   Coagulation:  Recent Labs  11/30/14 0513  LABPROT 13.3  INR 1.00   Urine Drug Screen: Drugs of Abuse     Component Value Date/Time   LABOPIA NONE DETECTED 10/13/2013 0516   COCAINSCRNUR POSITIVE* 10/13/2013 0516   LABBENZ NONE DETECTED 10/13/2013 0516   AMPHETMU NONE DETECTED 10/13/2013 0516   THCU NONE DETECTED 10/13/2013 0516   LABBARB NONE DETECTED 10/13/2013 0516     Imaging results:  Dg Chest 2  View  11/30/2014   CLINICAL DATA:  Defibrillator fired.  Initial encounter.  EXAM: CHEST  2 VIEW  COMPARISON:  Chest radiograph performed 10/15/2013  FINDINGS: The lungs are well-aerated. Mild vascular congestion is noted. Mild bibasilar atelectasis is seen. There is no evidence of pleural effusion or pneumothorax.  The heart is borderline normal in size. An AICD is noted at the left chest wall, with a single lead ending at the right ventricle. No acute osseous abnormalities are seen.  IMPRESSION: Mild vascular congestion noted.  Mild bibasilar atelectasis seen.   Electronically Signed   By: Roanna Raider M.D.   On: 11/30/2014 06:13    Other results: EKG: Rate 104, sinus tachycardia, right axis deviation, likely LA enlargement, prolonged QT  ICD interpretation: ventricular tachycardia, not a misfire  Assessment & Plan by Problem: Active Problems:   Defibrillator discharge  Mr. Arntzen is a 39 yo man with significant cardiac history including NICM with an ICD who presented when his ICD fired for the first time. The ICD interpretation is that he was briefly in ventricular tachycardia. The ICD interpretation revealed that the alarm occurred due to nonsustained ventricular tachycardia. He remained asymptomatic throughout the episode and continues to be asymptomatic in the ED. He will be admitted for observation.  Nonsustained Ventricular Tachycardia: This was recorded on the ICD; patient was asymptomatic. The patient is on amiodarone and carvedilol and reports compliance with taking these.  - Continue home amiodarone 200 mg by mouth daily and carvedilol 12.5 mg by mouth BID - Appreciate cardiology consult - Trending troponins (initial 0.15, likely due to myocardial strain in the setting of ICD firing) - Cardiac monitoring - EKG tomorrow am - BMP and CBC tomorrow am - Needs appointment for ICD check (goes to these every 3 months, last 08/2014)  Hypokalemia: K 3.3 - Potassium chloride 40 mg BID  today  History of Systolic CHF and NICM: No signs of fluid overload or acute exacerbation. Weight stable at 330 lbs (down from last clinic visit 08/2014 335 lbs). - Continue home torsemide 60 mg by mouth BID and spironolactone 12.5 by mouth daily; will d/c  if creatinine still elevated on morning labs - Strict I's & O's along with daily weights - A1c pending (last 6.1% in 2013) - Patient's ASCVD 10-year risk is 13.2%; should consider initiating high-intensity statin for prevention  Hypertension: Currently 117/59. Well-controlled. On home hydralazine, imdur, torsemide, lisinopril and spironolactone. - Holding home lisinopril 5 mg given mild bump in creatinine (1.49, baseline 1.20) - Continue home hydralazine 25 mg by mouth TID - Continue home Imdur 30 mg by mouth daily   Cocaine Abuse: Patient admits to using cocaine yesterday. Urine tox + for cocaine. - Initiated cessation counseling  Erectile Dysfunction: Patient does not take any medications for his ED.  Diet:  - HH  DVT Ppx:  - Lovenox [place]  Dispo: Disposition is deferred at this time, awaiting improvement of current medical problems. Anticipated discharge in approximately 1 day(s).   The patient does not have a current PCP (Pcp Not In System) and does need an [facility] hospital follow-up appointment after discharge (states he has one).  The patient does not have transportation limitations that hinder transportation to clinic appointments.  Signed: Dionne Ano, MD 11/30/2014, 10:12 AM

## 2014-11-30 NOTE — ED Provider Notes (Signed)
CSN: [id number]     Arrival date & time 11/30/14  0423 History   First MD Initiated Contact with Patient 11/30/14 [phone]     Chief Complaint  Patient presents with  . AICD Problem     (Consider location/radiation/quality/duration/timing/severity/associated sxs/prior Treatment) Patient is a 39 y.o. male presenting with chest pain. The history is provided by the patient. No language interpreter was used.  Chest Pain Pain quality: not aching   Pain radiates to the back: no   Pain severity:  No pain  patient reports he used cocaine last night around 8 PM patient admits to drinking alcohol as well she reports around 2 AM he was having a intimate moment with his girlfriend and his defibrillator shocked him patient reports he was shocked twice during a 10 second period of time patient reports he saw white. He did not lose consciousness he did not black out RN has had defibrillator interrogated. Patient feels fine. He denies any current pain denies any shortness of breath  Past Medical History  Diagnosis Date  . Hypertension   . Systolic CHF, chronic   . Obesity   . Nonischemic cardiomyopathy     a.  echo 4/06: EF 30%, mild to mod MR, mild RAE, inf HK, lat HK , ant HK;    b.  cath 4/06: no CAD, EF 20-25%  . ED (erectile dysfunction)   . NSVT (nonsustained ventricular tachycardia)     eval by EP in past; no ICD candidate due to NYHA 1 symptoms  . Automatic implantable cardioverter-defibrillator in situ   . Exertional shortness of breath    Past Surgical History  Procedure Laterality Date  . Multiple extractions with alveoloplasty N/A 01/26/2013    Procedure:  EXTRACION  TOOTH # 19 WITH ALVEOLOPLASTY;  Surgeon: Charlynne Pander, DDS;  Location: [facility] OR;  Service: Oral Surgery;  Laterality: N/A;  . Cardiac defibrillator placement  08/23/2013  . Cardiac catheterization  09/2011; 02/2013; 04/2013  . Left and right heart catheterization with coronary angiogram N/A 09/20/2011    Procedure: LEFT AND  RIGHT HEART CATHETERIZATION WITH CORONARY ANGIOGRAM;  Surgeon: Dolores Patty, MD;  Location: [facility] CATH LAB;  Service: Cardiovascular;  Laterality: N/A;  . Right heart catheterization N/A 02/22/2013    Procedure: RIGHT HEART CATH;  Surgeon: Dolores Patty, MD;  Location: [facility] CATH LAB;  Service: Cardiovascular;  Laterality: N/A;  . Right heart catheterization N/A 05/03/2013    Procedure: RIGHT HEART CATH;  Surgeon: Dolores Patty, MD;  Location: [facility] CATH LAB;  Service: Cardiovascular;  Laterality: N/A;  . Implantable cardioverter defibrillator implant N/A 08/23/2013    Procedure: IMPLANTABLE CARDIOVERTER DEFIBRILLATOR IMPLANT;  Surgeon: Duke Salvia, MD;  Location: [facility] CATH LAB;  Service: Cardiovascular;  Laterality: N/A;   Family History  Problem Relation Age of Onset  . Coronary artery disease Mother 49    s/p PCI  . Lung cancer Father   . Diabetes type II Maternal Uncle   . Coronary artery disease Maternal Uncle    History  Substance Use Topics  . Smoking status: Current Every Day Smoker -- 0.12 packs/day for 8 years    Types: Cigarettes  . Smokeless tobacco: Never Used  . Alcohol Use: 0.0 oz/week     Comment: 08/23/2013  "rarely have a beer anymore; last beer was a long time ago"    Review of Systems  Cardiovascular: Positive for chest pain.  All other systems reviewed and are negative.     Allergies  Penicillins  Home Medications   Prior to Admission medications   Medication Sig Start Date End Date Taking? Authorizing Provider  acetaminophen-codeine (TYLENOL #3) 300-30 MG per tablet Take 1-2 tablets by mouth every 6 (six) hours as needed for moderate pain. 09/23/14   Arby Barrette, MD  amiodarone (PACERONE) 200 MG tablet TAKE 1 TABLET BY MOUTH DAILY 05/06/14   Dolores Patty, MD  carvedilol (COREG) 12.5 MG tablet Take 1 tablet (12.5 mg total) by mouth 2 (two) times daily with a meal. 05/01/14   Dolores Patty, MD  clotrimazole-betamethasone (LOTRISONE) cream  Apply 1 application topically 2 (two) times daily. 09/23/14   Arby Barrette, MD  hydrALAZINE (APRESOLINE) 25 MG tablet Take 1 tablet (25 mg total) by mouth 3 (three) times daily. 01/29/14   Aundria Rud, NP  isosorbide mononitrate (IMDUR) 30 MG 24 hr tablet Take 1 tablet (30 mg total) by mouth daily. 04/10/14   Dolores Patty, MD  lisinopril (PRINIVIL,ZESTRIL) 5 MG tablet Take 1 tablet (5 mg total) by mouth 2 (two) times daily. 04/10/14   Dolores Patty, MD  metolazone (ZAROXOLYN) 2.5 MG tablet Take 1 tablet (2.5 mg total) by mouth as needed. 08/17/13   Amy D Clegg, NP  potassium chloride SA (K-DUR,KLOR-CON) 20 MEQ tablet Take 1 tablet (20 mEq total) by mouth daily. 08/26/14   Laurey Morale, MD  spironolactone (ALDACTONE) 25 MG tablet Take 0.5 tablets (12.5 mg total) by mouth daily. 08/26/14   Laurey Morale, MD  torsemide (DEMADEX) 20 MG tablet Take 3 tablets (60 mg total) by mouth 2 (two) times daily. 04/10/14   Bevelyn Buckles Bensimhon, MD   BP 122/63 mmHg  Pulse 98  Temp(Src) 98.3 F (36.8 C) (Oral)  Resp 18  Ht 6\' 4"  (1.93 m)  Wt 330 lb (149.687 kg)  BMI 40.19 kg/m2  SpO2 97% Physical Exam  Constitutional: He is oriented to person, place, and time. He appears well-developed and well-nourished.  HENT:  Head: Normocephalic.  Right Ear: External ear normal.  Left Ear: External ear normal.  Mouth/Throat: Oropharynx is clear and moist.  Eyes: EOM are normal. Pupils are equal, round, and reactive to light.  Neck: Normal range of motion.  Cardiovascular: Normal rate and normal heart sounds.   Pulmonary/Chest: Effort normal and breath sounds normal.  Abdominal: Soft. He exhibits no distension.  Musculoskeletal: Normal range of motion.  Neurological: He is alert and oriented to person, place, and time.  Skin: Skin is warm.  Psychiatric: He has a normal mood and affect.  Nursing note and vitals reviewed.   ED Course  Procedures (including critical care time) Labs Review Labs  Reviewed  BASIC METABOLIC PANEL - Abnormal; Notable for the following:    Potassium 3.3 (*)    Glucose, Bld 186 (*)    Creatinine, Ser 1.49 (*)    GFR calc non Af Amer 57 (*)    GFR calc Af Amer 66 (*)    All other components within normal limits  I-STAT TROPOININ, ED - Abnormal; Notable for the following:    Troponin i, poc 0.15 (*)    All other components within normal limits  CBC WITH DIFFERENTIAL  MAGNESIUM  PROTIME-INR    Imaging Review No results found.   EKG Interpretation   Date/Time:  Saturday November 30 2014 04:49:15 EST Ventricular Rate:  104 PR Interval:  171 QRS Duration: 92 QT Interval:  395 QTC Calculation: 520 R Axis:   17 Text Interpretation:  Sinus  tachycardia Probable left atrial enlargement  Prolonged QT interval Confirmed by Erroll Luna [phone]) on  11/30/2014 5:16:41 AM      MDM  I spoke to Dr. [name] Cardiologist.  He advised medical admission for observation.  I spoke to Dr. Yetta Barre teaching service. Pt to be admitted to hospital for observation.    Final diagnoses:  Chest pain  Implantable cardioverter-defibrillator (ICD) discharge        Elson Areas, PA-C 11/30/14 0825  Tomasita Crumble, MD 11/30/14 [phone]

## 2014-11-30 NOTE — ED Notes (Signed)
Notified Secretary to please call [employer]. Pt's defibrillator was interrogated this morning around 5am by night shift RN. Report has not been sent over. Secretary will notify [employer] to please send someone to interrogate.

## 2014-11-30 NOTE — Consult Note (Signed)
Primary cardiologist:Dr Klein/Dr Bensimhon Consulting cardiologist: Dr Dina Rich MD  Clinical Summary Douglas [name] is a 39 y.o.male history of NICM LVEF 35-40% by echo, ICD followed by Dr Graciela Husbands,  HTN, HL, NSVT, medical non-compliance admitted with reported ICD fire. He reportedly was using [facility] and cocaine yesterday, and while having sex with his girlfriend noted 2 shocks from his ICD. He denies any chest pain or palpitations. No significant SOB, DOE, or LE edema. Reports compliant with his medications.   K 3.3, Cr 1.49, Gluc 186, trop 0.15, Hgb 15.2, Plt 237, Mg 1.6 CXR mild congestion EKG NSR, prolonged QT, no acute ischemic changes.     Allergies  Allergen Reactions  . Penicillins Other (See Comments)    Unknown childhood reaction     Medications Scheduled Medications: . potassium chloride  40 mEq Oral BID     Infusions:     PRN Medications:     Past Medical History  Diagnosis Date  . Hypertension   . Systolic CHF, chronic   . Obesity   . Nonischemic cardiomyopathy     a.  echo 4/06: EF 30%, mild to mod MR, mild RAE, inf HK, lat HK , ant HK;    b.  cath 4/06: no CAD, EF 20-25%  . ED (erectile dysfunction)   . NSVT (nonsustained ventricular tachycardia)     eval by EP in past; no ICD candidate due to NYHA 1 symptoms  . Automatic implantable cardioverter-defibrillator in situ   . Exertional shortness of breath     Past Surgical History  Procedure Laterality Date  . Multiple extractions with alveoloplasty N/A 01/26/2013    Procedure:  EXTRACION  TOOTH # 19 WITH ALVEOLOPLASTY;  Surgeon: Charlynne Pander, DDS;  Location: [facility] OR;  Service: Oral Surgery;  Laterality: N/A;  . Cardiac defibrillator placement  08/23/2013  . Cardiac catheterization  09/2011; 02/2013; 04/2013  . Left and right heart catheterization with coronary angiogram N/A 09/20/2011    Procedure: LEFT AND RIGHT HEART CATHETERIZATION WITH CORONARY ANGIOGRAM;  Surgeon: Dolores Patty, MD;   Location: [facility] CATH LAB;  Service: Cardiovascular;  Laterality: N/A;  . Right heart catheterization N/A 02/22/2013    Procedure: RIGHT HEART CATH;  Surgeon: Dolores Patty, MD;  Location: [facility] CATH LAB;  Service: Cardiovascular;  Laterality: N/A;  . Right heart catheterization N/A 05/03/2013    Procedure: RIGHT HEART CATH;  Surgeon: Dolores Patty, MD;  Location: [facility] CATH LAB;  Service: Cardiovascular;  Laterality: N/A;  . Implantable cardioverter defibrillator implant N/A 08/23/2013    Procedure: IMPLANTABLE CARDIOVERTER DEFIBRILLATOR IMPLANT;  Surgeon: Duke Salvia, MD;  Location: [facility] CATH LAB;  Service: Cardiovascular;  Laterality: N/A;    Family History  Problem Relation Age of Onset  . Coronary artery disease Mother 87    s/p PCI  . Lung cancer Father   . Diabetes type II Maternal Uncle   . Coronary artery disease Maternal Uncle     Social History Mr. Cedillos reports that he has been smoking Cigarettes.  He has a .96 pack-year smoking history. He has never used smokeless tobacco. Mr. Bramel reports that he drinks alcohol.  Review of Systems CONSTITUTIONAL: No weight loss, fever, chills, weakness or fatigue.  HEENT: Eyes: No visual loss, blurred vision, double vision or yellow sclerae. No hearing loss, sneezing, congestion, runny nose or sore throat.  SKIN: No rash or itching.  CARDIOVASCULAR: per HPI RESPIRATORY: No shortness of breath, cough or sputum.  GASTROINTESTINAL: No anorexia,  nausea, vomiting or diarrhea. No abdominal pain or blood.  GENITOURINARY: no polyuria, no dysuria NEUROLOGICAL: No headache, dizziness, syncope, paralysis, ataxia, numbness or tingling in the extremities. No change in bowel or bladder control.  MUSCULOSKELETAL: No muscle, back pain, joint pain or stiffness.  HEMATOLOGIC: No anemia, bleeding or bruising.  LYMPHATICS: No enlarged nodes. No history of splenectomy.  PSYCHIATRIC: No history of depression or anxiety.      Physical  Examination Blood pressure 121/59, pulse 83, temperature 98.2 F (36.8 C), temperature source Oral, resp. rate 13, height 6\' 4"  (1.93 m), weight 330 lb (149.687 kg), SpO2 93 %.  Intake/Output Summary (Last 24 hours) at 11/30/14 1357 Last data filed at 11/30/14 1004  Gross per 24 hour  Intake      0 ml  Output      0 ml  Net      0 ml    HEENT: sclera clear  Cardiovascular: RRR, no m/r/g, no JVD  Respiratory: CTAB  GI: abdomen soft, NT, ND  MSK: no LE edema  Neuro: no focal deficits  Psych: appropriate affect   Lab Results  Basic Metabolic Panel:  Recent Labs Lab 11/30/14 0513  NA 135  K 3.3*  CL 100  CO2 28  GLUCOSE 186*  BUN 7  CREATININE 1.49*  CALCIUM 8.5  MG 1.6    Liver Function Tests: No results for input(s): AST, ALT, ALKPHOS, BILITOT, PROT, ALBUMIN in the last 168 hours.  CBC:  Recent Labs Lab 11/30/14 0513  WBC 9.3  NEUTROABS 5.4  HGB 15.2  HCT 42.4  MCV 84.1  PLT 237    Cardiac Enzymes: No results for input(s): CKTOTAL, CKMB, CKMBINDEX, TROPONINI in the last 168 hours.  BNP: Invalid input(s): POCBNP    Impression/Recommendations 1. ICD shock - in setting of chronic systolic HF LVEF 12/02/14, hx of NSVT, and recent cocaine use - device interrogation shows shocks x 2 with succesful terminate of VF.  - mild troponin elevation likely due to shock vs cocaine induced, he has hx of NICM with prior cath with patent coronaries.  - monitor for repeat arrythmias overnight  2. Chronic systolic heart failure - weight stable at 330, labs suggest he is likely hypovolemic.  - hold diuretic, ACD, and aldactoe today, resume other home cardiac meds - couseled on theopertic risks of beta blockers and cocaine use. Given known benefits of beta blockers vs theoretical and unproven risks would continue at this time.   3. Elevated troponin - likely related to ICD fire vs cocaine induced - cycle enzymes overnight, repeat EKG in AM  4. AKI - per  medicine team. Hold ACE, diuretic, aldactoe  5,. Hypokalemia - please replace K to 4 and Mg to 2.      68-12%, M.D.

## 2014-11-30 NOTE — ED Notes (Signed)
[employer] defib interrogated by [employer]

## 2014-11-30 NOTE — ED Notes (Signed)
Notified [employer]

## 2014-11-30 NOTE — ED Notes (Signed)
Paged [phone] to [phone] 

## 2014-11-30 NOTE — ED Notes (Signed)
Patient was at home when his defibrillator shocked him around 2am after having "a passionate moment".  Patient states the defibrillator shocked him twice with in a 10 second period.  Patient denies chest pains, SOB, nausea and dizziness.  Patient has no complaints at this time.

## 2014-11-30 NOTE — Plan of Care (Signed)
Problem: Phase I Progression Outcomes Goal: Electrolytes normal or corrected Outcome: Progressing K+ 3.3 pt po supp in ED

## 2014-11-30 NOTE — ED Notes (Signed)
Dr Mora Bellman given a copy of troponin results .15

## 2014-11-30 NOTE — ED Notes (Signed)
Patient denies any chest pains at this time.  Patient is alert and oriented, no pain, no SOB, no lightheadedness or dizziness.

## 2014-12-01 ENCOUNTER — Encounter (HOSPITAL_COMMUNITY): Payer: Self-pay | Admitting: *Deleted

## 2014-12-01 LAB — BASIC METABOLIC PANEL
Anion gap: 10 (ref 5–15)
BUN: 9 mg/dL (ref 6–23)
CALCIUM: 8.5 mg/dL (ref 8.4–10.5)
CO2: 27 mmol/L (ref 19–32)
Chloride: 100 mEq/L (ref 96–112)
Creatinine, Ser: 1.31 mg/dL (ref 0.50–1.35)
GFR calc Af Amer: 77 mL/min — ABNORMAL LOW (ref >90)
GFR calc non Af Amer: 67 mL/min — ABNORMAL LOW (ref >90)
Glucose, Bld: 133 mg/dL — ABNORMAL HIGH (ref 70–99)
POTASSIUM: 3.9 mmol/L (ref 3.5–5.1)
SODIUM: 137 mmol/L (ref 135–145)

## 2014-12-01 LAB — HEMOGLOBIN A1C
HEMOGLOBIN A1C: 7.5 % — AB (ref <5.7)
Mean Plasma Glucose: 169 mg/dL — ABNORMAL HIGH (ref <117)

## 2014-12-01 LAB — CBC
HCT: 39.2 % (ref 39.0–52.0)
HEMOGLOBIN: 13.6 g/dL (ref 13.0–17.0)
MCH: 29.5 pg (ref 26.0–34.0)
MCHC: 34.7 g/dL (ref 30.0–36.0)
MCV: 85 fL (ref 78.0–100.0)
Platelets: 226 10*3/uL (ref 150–400)
RBC: 4.61 MIL/uL (ref 4.22–5.81)
RDW: 13.6 % (ref 11.5–15.5)
WBC: 7.5 10*3/uL (ref 4.0–10.5)

## 2014-12-01 LAB — MAGNESIUM: MAGNESIUM: 2.1 mg/dL (ref 1.5–2.5)

## 2014-12-01 NOTE — Progress Notes (Signed)
Utilization review completed.  

## 2014-12-01 NOTE — Discharge Summary (Signed)
Name: Douglas [name] MRN: [id number] DOB: Oct 13, 1976 39 y.o. PCP: Pcp Not In System  Date of Admission: 11/30/2014  4:38 AM Date of Discharge: 12/01/2014 Attending Physician: Inez Catalina, MD  Discharge Diagnosis: Active Problems:   Defibrillator discharge   ICD (implantable cardioverter-defibrillator) discharge  Discharge Medications:   Medication List    TAKE these medications        acetaminophen-codeine 300-30 MG per tablet  Commonly known as:  TYLENOL #3  Take 1-2 tablets by mouth every 6 (six) hours as needed for moderate pain.     amiodarone 200 MG tablet  Commonly known as:  PACERONE  TAKE 1 TABLET BY MOUTH DAILY     carvedilol 12.5 MG tablet  Commonly known as:  COREG  Take 1 tablet (12.5 mg total) by mouth 2 (two) times daily with a meal.     clotrimazole-betamethasone cream  Commonly known as:  LOTRISONE  Apply 1 application topically 2 (two) times daily.     hydrALAZINE 25 MG tablet  Commonly known as:  APRESOLINE  Take 1 tablet (25 mg total) by mouth 3 (three) times daily.     isosorbide mononitrate 30 MG 24 hr tablet  Commonly known as:  IMDUR  Take 1 tablet (30 mg total) by mouth daily.     lisinopril 5 MG tablet  Commonly known as:  PRINIVIL,ZESTRIL  Take 1 tablet (5 mg total) by mouth 2 (two) times daily.     metolazone 2.5 MG tablet  Commonly known as:  ZAROXOLYN  Take 1 tablet (2.5 mg total) by mouth as needed.     potassium chloride SA 20 MEQ tablet  Commonly known as:  K-DUR,KLOR-CON  Take 1 tablet (20 mEq total) by mouth daily.     spironolactone 25 MG tablet  Commonly known as:  ALDACTONE  Take 0.5 tablets (12.5 mg total) by mouth daily.     torsemide 20 MG tablet  Commonly known as:  DEMADEX  Take 3 tablets (60 mg total) by mouth 2 (two) times daily.        Disposition and follow-up:   [name] was discharged from [facility] in Stable condition.  At the hospital follow up visit please  address:  1.  Nonsustained Ventricular Fibrillation: in the setting of cocaine use and sexual activity. Recorded on ICD. Patient remained asymptomatic with slightly elevated troponins (likely from the myocardial strain of the ICD firing vs cocaine use). Patient already takes amiodarone and carvedilol (reports compliance). Has scheduled follow up this month with Drs. Bensimhon and Graciela Husbands.  Resolved Hypokalemia and Hypomagnesemia: Recheck at follow-up  Cocaine Abuse: Please continue to counsel patient on cocaine use in the setting of his heart disease  Hyperlipidemia: ASCVD 10-year risk is 13.2%; should consider initiating high-intensity statin for prevention  2.  Labs / imaging needed at time of follow-up: BMET, A1c  3.  Pending labs/ test needing follow-up: none  Follow-up Appointments: Patient will set up his appointments with Dr. [name] and Dr. Graciela Husbands (their offices call him for his periodic appointments)  [facility] Dr. Johna Roles 12/10/14 at 3:15PM  Discharge Instructions: Please follow-up with Dr. [name] and Dr. Graciela Husbands as scheduled. Return to the hospital with any ICD activity in the meantime.   Consultations: Treatment Team:  Antoine Poche, MD  Procedures Performed:  Dg Chest 2 View  11/30/2014   CLINICAL DATA:  Defibrillator fired.  Initial encounter.  EXAM: CHEST  2 VIEW  COMPARISON:  Chest radiograph performed  10/15/2013  FINDINGS: The lungs are well-aerated. Mild vascular congestion is noted. Mild bibasilar atelectasis is seen. There is no evidence of pleural effusion or pneumothorax.  The heart is borderline normal in size. An AICD is noted at the left chest wall, with a single lead ending at the right ventricle. No acute osseous abnormalities are seen.  IMPRESSION: Mild vascular congestion noted.  Mild bibasilar atelectasis seen.   Electronically Signed   By: Roanna Raider M.D.   On: 11/30/2014 06:13   Admission HPI: Mr. Mccoy [name] is a 39 year old man who has a history of  heart failure due to NICM (cath showing no CAD at HF diagnosis in 2006, repeat cath 02/2013 significant biventricular HF with elevated filling pressures and low output, last echo 2015 EF 35-40%, now s/p ICD implantation in 08/2013), HTN, obesity, ED and cocaine use who presents after his ICD fired twice at 2 am this morning. During the day yesterday, he had several beers and used cocaine; he denies any other elicit drug use or any non-prescribed medication ingestion (including ED medications). Later, he was having an "intimate moment" with his girlfriend when his ICD fired. He was standing at the time, and then noted the ICD to fire a second time. He noted no chest pain, palpitations, shortness of breath, light-headedness, pre-syncope or loss of consciousness before, during or after the episode. His ICD has never before fired.  He has a history of severe non-compliance that has complicated his cardiac care, but more recently, it appears that he has been taking his medications as prescribed and putting forth an effort to lose weight.  Hospital Course by problem list: Active Problems:   Defibrillator discharge   ICD (implantable cardioverter-defibrillator) discharge   Mr. Macioce is a 39 yo man with significant cardiac history including NICM with an ICD who presented when his ICD fired for the first time. The ICD interrogation revealed that the alarm occurred due to nonsustained ventricular fibrillation. He remained asymptomatic throughout and after the episode. He had several beats of vtach on tele yesterday and none overnight. These are to be expected following cocaine use. He is ready for discharge today.  Nonsustained Ventricular Fibrillation: This was recorded on the ICD; patient was asymptomatic. The patient is on amiodarone and carvedilol and reports compliance with taking these. Troponins slightly elevated (probably in the setting of myocardial strain of ICD firing vs cocaine use). Needs appointment  for ICD check (goes to these every 3 months, last 08/2014); patient states Dr. Prescott Gum and Dr. Odessa Fleming offices call him to set these up. He also needs to establish a PCP; would like to go to [facility] and we will help with that.   Resolved Hypokalemia and Hypomagnesemia: K 3.3-->3.9, Mg 1.6-->2.1 after supplementation.  Chronic Systolic CHF and NICM: No signs of fluid overload or acute exacerbation. Weight stable at 330 lbs (down from last clinic visit 08/2014 335 lbs) with labs suggestive of hypovolemia. Creatinine downtrending today (1.49-->1.31). Patient's ASCVD 10-year risk is 13.2%; should consider initiating high-intensity statin for prevention.   Hypertension: Well-controlled in the hospital (110s / 60s). On home hydralazine, imdur, torsemide, lisinopril and spironolactone.  Cocaine Abuse: Patient admits to using cocaine yesterday. Urine tox + for cocaine. Initiated cessation counseling, particularly with BB in place as outpatient  Erectile Dysfunction: Patient does not take any medications for his ED.  Discharge Vitals:   BP 116/64 mmHg  Pulse 72  Temp(Src) 98.8 F (37.1 C) (Oral)  Resp 20  Ht 6'  4" (1.93 m)  Wt 327 lb 9.6 oz (148.598 kg)  BMI 39.89 kg/m2  SpO2 98%  Discharge Labs:  Results for orders placed or performed during the hospital encounter of 11/30/14 (from the past 24 hour(s))  Troponin I     Status: Abnormal   Collection Time: 11/30/14  3:16 PM  Result Value Ref Range   Troponin I 0.30 (H) <0.031 ng/mL  Troponin I     Status: Abnormal   Collection Time: 11/30/14 10:05 PM  Result Value Ref Range   Troponin I 0.31 (H) <0.031 ng/mL  Basic metabolic panel     Status: Abnormal   Collection Time: 12/01/14  4:56 AM  Result Value Ref Range   Sodium 137 135 - 145 mmol/L   Potassium 3.9 3.5 - 5.1 mmol/L   Chloride 100 96 - 112 mEq/L   CO2 27 19 - 32 mmol/L   Glucose, Bld 133 (H) 70 - 99 mg/dL   BUN 9 6 - 23 mg/dL   Creatinine, Ser 1.28 0.50 - 1.35 mg/dL   Calcium  8.5 8.4 - 78.6 mg/dL   GFR calc non Af Amer 67 (L) >90 mL/min   GFR calc Af Amer 77 (L) >90 mL/min   Anion gap 10 5 - 15  CBC     Status: None   Collection Time: 12/01/14  4:56 AM  Result Value Ref Range   WBC 7.5 4.0 - 10.5 K/uL   RBC 4.61 4.22 - 5.81 MIL/uL   Hemoglobin 13.6 13.0 - 17.0 g/dL   HCT 76.7 20.9 - 47.0 %   MCV 85.0 78.0 - 100.0 fL   MCH 29.5 26.0 - 34.0 pg   MCHC 34.7 30.0 - 36.0 g/dL   RDW 96.2 83.6 - 62.9 %   Platelets 226 150 - 400 K/uL  Magnesium     Status: None   Collection Time: 12/01/14  4:56 AM  Result Value Ref Range   Magnesium 2.1 1.5 - 2.5 mg/dL    Signed: Dionne Ano, MD 12/01/2014, 1:30 PM    Services Ordered on Discharge: none Equipment Ordered on Discharge: none

## 2014-12-01 NOTE — Progress Notes (Signed)
Subjective: Douglas [name] feels great this morning. He would like to get home because he is expecting to become a grandparent in the next few days.   Objective: Vital signs in last 24 hours: Filed Vitals:   11/30/14 1406 11/30/14 1450 11/30/14 1944 12/01/14 0400  BP: 127/76 135/76 133/66 121/76  Pulse:   85 71  Temp: 97.6 F (36.4 C) 98.7 F (37.1 C) 98.2 F (36.8 C) 98.5 F (36.9 C)  TempSrc:  Oral Oral Oral  Resp: 18     Height:  6\' 4"  (1.93 m)    Weight:  333 lb 8 oz (151.275 kg)  327 lb 9.6 oz (148.598 kg)  SpO2: 96%  95% 94%   Weight change: 3 lb 8 oz (1.588 kg)  Intake/Output Summary (Last 24 hours) at 12/01/14 0932 Last data filed at 12/01/14 0913  Gross per 24 hour  Intake    366 ml  Output      0 ml  Net    366 ml   Physical Exam: Appearance: in NAD, finishing his lunch in bedside chair HEENT: AT/[place], no signs of trauma, PERRL, EOMi, no lymphadenopathy Heart: RRR, normal S1S2, no murmurs, ICD site nontender to palpation, well-healed scar over it with no  Lungs: CTAB including bases, no wheezes Abdomen: obese abdomen, BS+, soft, nontender, no organomegaly Extremities: trace edema in BLE Neurologic: A&Ox3, grossly intact Skin: no rashes or lesions  Lab Results: Basic Metabolic Panel:  Recent Labs Lab 11/30/14 0513 12/01/14 0456  NA 135 137  K 3.3* 3.9  CL 100 100  CO2 28 27  GLUCOSE 186* 133*  BUN 7 9  CREATININE 1.49* 1.31  CALCIUM 8.5 8.5  MG 1.6  --    CBC:  Recent Labs Lab 11/30/14 0513 12/01/14 0456  WBC 9.3 7.5  NEUTROABS 5.4  --   HGB 15.2 13.6  HCT 42.4 39.2  MCV 84.1 85.0  PLT 237 226   Cardiac Enzymes:  Recent Labs Lab 11/30/14 1516 11/30/14 2205  TROPONINI 0.30* 0.31*   Coagulation:  Recent Labs Lab 11/30/14 0513  LABPROT 13.3  INR 1.00   Urine Drug Screen: Drugs of Abuse     Component Value Date/Time   LABOPIA NONE DETECTED 10/13/2013 0516   COCAINSCRNUR POSITIVE* 10/13/2013 0516   LABBENZ NONE DETECTED  10/13/2013 0516   AMPHETMU NONE DETECTED 10/13/2013 0516   THCU NONE DETECTED 10/13/2013 0516   LABBARB NONE DETECTED 10/13/2013 0516    Studies/Results: Dg Chest 2 View  11/30/2014   CLINICAL DATA:  Defibrillator fired.  Initial encounter.  EXAM: CHEST  2 VIEW  COMPARISON:  Chest radiograph performed 10/15/2013  FINDINGS: The lungs are well-aerated. Mild vascular congestion is noted. Mild bibasilar atelectasis is seen. There is no evidence of pleural effusion or pneumothorax.  The heart is borderline normal in size. An AICD is noted at the left chest wall, with a single lead ending at the right ventricle. No acute osseous abnormalities are seen.  IMPRESSION: Mild vascular congestion noted.  Mild bibasilar atelectasis seen.   Electronically Signed   By: 14/11/2012 M.D.   On: 11/30/2014 06:13   Medications: I have reviewed the patient's current medications. Scheduled Meds: . amiodarone  200 mg Oral Daily  . carvedilol  12.5 mg Oral BID WC  . enoxaparin (LOVENOX) injection  40 mg Subcutaneous Q24H  . hydrALAZINE  25 mg Oral TID  . isosorbide mononitrate  30 mg Oral Daily  . sodium chloride  3 mL Intravenous  Q12H  . spironolactone  12.5 mg Oral Daily  . torsemide  60 mg Oral BID   Continuous Infusions:  PRN Meds:. Assessment/Plan: Active Problems:   Defibrillator discharge   ICD (implantable cardioverter-defibrillator) discharge  Douglas [name] is a 39 yo man with significant cardiac history including NICM with an ICD who presented when his ICD fired for the first time. The ICD interrogation revealed that the alarm occurred due to nonsustained ventricular fibrillation. He remained asymptomatic throughout and after the episode. He had several beats of vtach on tele yesterday and none overnight. These are to be expected following cocaine use. He is ready for discharge today.  Nonsustained Ventricular Fibrillation: This was recorded on the ICD; patient was asymptomatic. The patient is on  amiodarone and carvedilol and reports compliance with taking these. Troponins slightly elevated (probably in the setting of myocardial strain of ICD firing vs cocaine use). - Continue home amiodarone 200 mg by mouth daily and carvedilol 12.5 mg by mouth BID - Appreciate cardiology consult - EKG tomorrow am - BMP and CBC tomorrow am - Needs appointment for ICD check (goes to these every 3 months, last 08/2014); patient states Dr. Prescott Gum and Dr. Odessa Fleming offices call him to set these up. He also needs to establish a PCP; would like to go to [facility] and we will help with that.    Resolved Hypokalemia and Hypomagnesemia: K 3.3-->3.9, Mg 1.6-->2.1 after supplementation yesterday.  Chronic Systolic CHF and NICM: No signs of fluid overload or acute exacerbation. Weight stable at 330 lbs (down from last clinic visit 08/2014 335 lbs) with labs suggestive of hypovolemia. Creatinine downtrending today (1.49-->1.31). - Continue home torsemide 60 mg by mouth BID and spironolactone 12.5 by mouth daily - Strict I's & O's along with daily weights - A1c pending (last 6.1% in 2013) - Patient's ASCVD 10-year risk is 13.2%; should consider initiating high-intensity statin for prevention  Hypertension: Currently 117/59. Well-controlled. On home hydralazine, imdur, torsemide, lisinopril and spironolactone. - Resume home lisinopril (creatinine downtrending) - Continue home hydralazine 25 mg by mouth TID - Continue home Imdur 30 mg by mouth daily  Cocaine Abuse: Patient admits to using cocaine yesterday. Urine tox + for cocaine. - Initiated cessation counseling, particularly with BB in place as outpatient  Erectile Dysfunction: Patient does not take any medications for his ED.  Diet:  - HH  DVT Ppx:  - Lovenox [place]  Dispo: Disposition is deferred at this time, awaiting improvement of current medical problems.  Anticipated discharge in approximately 0 day(s).   The patient does not have a current PCP (Pcp Not  In System) and does need an [facility] hospital follow-up appointment after discharge.  The patient does not have transportation limitations that hinder transportation to clinic appointments.  .Services Needed at time of discharge: Y = Yes, Blank = No PT:   OT:   RN:   Equipment:   Other:     LOS: 1 day   Dionne Ano, MD 12/01/2014, 9:32 AM

## 2014-12-04 ENCOUNTER — Other Ambulatory Visit (HOSPITAL_COMMUNITY): Payer: Self-pay | Admitting: Cardiology

## 2014-12-04 DIAGNOSIS — I5022 Chronic systolic (congestive) heart failure: Secondary | ICD-10-CM

## 2014-12-04 DIAGNOSIS — I4729 Other ventricular tachycardia: Secondary | ICD-10-CM

## 2014-12-04 DIAGNOSIS — I472 Ventricular tachycardia, unspecified: Secondary | ICD-10-CM

## 2014-12-04 DIAGNOSIS — I428 Other cardiomyopathies: Secondary | ICD-10-CM

## 2014-12-04 DIAGNOSIS — Z95810 Presence of automatic (implantable) cardiac defibrillator: Secondary | ICD-10-CM

## 2014-12-04 MED ORDER — TORSEMIDE 20 MG PO TABS: 60.0000 mg | ORAL_TABLET | Freq: Two times a day (BID) | ORAL | Status: DC

## 2014-12-04 MED ORDER — CARVEDILOL 12.5 MG PO TABS: 12.5000 mg | ORAL_TABLET | Freq: Two times a day (BID) | ORAL | Status: DC

## 2014-12-04 MED ORDER — LISINOPRIL 5 MG PO TABS: 5.0000 mg | ORAL_TABLET | Freq: Two times a day (BID) | ORAL | Status: DC

## 2014-12-05 NOTE — Discharge Instructions (Signed)
Please follow-up with Dr. [name] and Dr. Graciela Husbands as scheduled. Return to the hospital with any ICD activity in the meantime.

## 2014-12-10 ENCOUNTER — Encounter: Payer: Self-pay | Admitting: Internal Medicine

## 2014-12-10 ENCOUNTER — Ambulatory Visit (INDEPENDENT_AMBULATORY_CARE_PROVIDER_SITE_OTHER): Payer: Medicaid Other | Admitting: Internal Medicine

## 2014-12-10 VITALS — BP 116/72 | HR 86 | Temp 98.2°F | Ht 75.0 in | Wt 339.3 lb

## 2014-12-10 DIAGNOSIS — N182 Chronic kidney disease, stage 2 (mild): Secondary | ICD-10-CM

## 2014-12-10 DIAGNOSIS — E785 Hyperlipidemia, unspecified: Secondary | ICD-10-CM

## 2014-12-10 DIAGNOSIS — I10 Essential (primary) hypertension: Secondary | ICD-10-CM

## 2014-12-10 DIAGNOSIS — E1165 Type 2 diabetes mellitus with hyperglycemia: Secondary | ICD-10-CM

## 2014-12-10 DIAGNOSIS — I130 Hypertensive heart and chronic kidney disease with heart failure and stage 1 through stage 4 chronic kidney disease, or unspecified chronic kidney disease: Secondary | ICD-10-CM

## 2014-12-10 DIAGNOSIS — I5042 Chronic combined systolic (congestive) and diastolic (congestive) heart failure: Secondary | ICD-10-CM

## 2014-12-10 DIAGNOSIS — Z0000 Encounter for general adult medical examination without abnormal findings: Secondary | ICD-10-CM

## 2014-12-10 DIAGNOSIS — F1410 Cocaine abuse, uncomplicated: Secondary | ICD-10-CM

## 2014-12-10 DIAGNOSIS — F17210 Nicotine dependence, cigarettes, uncomplicated: Secondary | ICD-10-CM

## 2014-12-10 DIAGNOSIS — E1122 Type 2 diabetes mellitus with diabetic chronic kidney disease: Secondary | ICD-10-CM

## 2014-12-10 DIAGNOSIS — Z4502 Encounter for adjustment and management of automatic implantable cardiac defibrillator: Secondary | ICD-10-CM

## 2014-12-10 DIAGNOSIS — Z95810 Presence of automatic (implantable) cardiac defibrillator: Secondary | ICD-10-CM

## 2014-12-10 LAB — LIPID PANEL
Cholesterol: 235 mg/dL — ABNORMAL HIGH (ref 0–200)
HDL: 45 mg/dL (ref >39)
LDL Cholesterol: 154 mg/dL — ABNORMAL HIGH (ref 0–99)
TRIGLYCERIDES: 182 mg/dL — AB (ref <150)
Total CHOL/HDL Ratio: 5.2 Ratio
VLDL: 36 mg/dL (ref 0–40)

## 2014-12-10 LAB — BASIC METABOLIC PANEL WITH GFR
BUN: 16 mg/dL (ref 6–23)
CHLORIDE: 100 meq/L (ref 96–112)
CO2: 28 mEq/L (ref 19–32)
Calcium: 9.6 mg/dL (ref 8.4–10.5)
Creat: 1.42 mg/dL — ABNORMAL HIGH (ref 0.50–1.35)
GFR, EST AFRICAN AMERICAN: 71 mL/min
GFR, Est Non African American: 61 mL/min
GLUCOSE: 187 mg/dL — AB (ref 70–99)
Potassium: 4.4 mEq/L (ref 3.5–5.3)
SODIUM: 137 meq/L (ref 135–145)

## 2014-12-10 LAB — MAGNESIUM: MAGNESIUM: 1.9 mg/dL (ref 1.5–2.5)

## 2014-12-10 LAB — HM DIABETES EYE EXAM

## 2014-12-10 MED ORDER — METFORMIN HCL 500 MG PO TABS: 500.0000 mg | ORAL_TABLET | Freq: Two times a day (BID) | ORAL | Status: DC

## 2014-12-10 NOTE — Patient Instructions (Addendum)
-  Start taking metformin 500 mg twice a day for diabetes -Will check you eyes and foot exam  -Will refer you to podiatry  -Will check your bloodwork today  -Will see you back in 3 months for diabetes follow-up -Please see your heart failure and cardiology doctors soon for follow-up   General Instructions:   Please bring your medicines with you each time you come to clinic.  Medicines may include prescription medications, over-the-counter medications, herbal remedies, eye drops, vitamins, or other pills.   Progress Toward Treatment Goals:  No flowsheet data found.  Self Care Goals & Plans:  Self Care Goal 12/10/2014  Manage my medications take my medicines as prescribed; bring my medications to every visit; refill my medications on time  Eat healthy foods eat foods that are low in salt; eat baked foods instead of fried foods  Be physically active find an activity I enjoy  Stop smoking cut down the number of cigarettes smoked; go to the [employer] ([email or web address])    No flowsheet data found.   Care Management & Community Referrals:  No flowsheet data found.

## 2014-12-10 NOTE — Progress Notes (Signed)
Patient ID: Douglas [name], male   DOB: Feb 28, 1976, 39 y.o.   MRN: [id number]    Subjective:   Patient ID: Douglas [name] male   DOB: Apr 11, 1976 39 y.o.   MRN: [id number]  HPI: [name] is a 39 y.o. 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   He was hospitalized from 1/16-1/17/16 for first time firing of his ICD in setting of possible non-sustained vfib. He reports being in jail on 1/13-14 and not receiving his medications for 2 days while there. He had also used cocaine and was being intimate at the time when his ICD fired. His magnesium was also low (1.6) on admission in addition to potassium (3.3). He was asymptomatic before/during the firing and during hospitalization with no chest pain, palpitations, dyspnea, lightheadedness, diaphoresis, or syncope. He follows with cardiology for regular (2-month) ICD checks. He has been asymptomatic since discharge.   He is compliant with taking hydralazine, imdur, torsemide, lisinopril, and spironolactone for high blood pressure and chronic CHF. He has chronic DOE and LE swelling that are at baseline. He denies orthopnea, dietary indiscretion, or weight gain (however per chart review, wt up from 327 to 339 lb since hospitalization). He follows with heart failure clinic.   His A1c checked during hospitalization was 7.5 on 12/01/14. He is not aware of having Type 2 diabetes in the past. The last time his A1c was checked was on 06/22/12 and it was 6.1 (pre-diabetes) at that time. He reports having chronic blurry vision, polydipsia, polyuria, polyphagia, peripheral neuropathy, and crusty/thick/yellow toenails. He not had a recent eye examination. His current BMI is 42. He reports following a healthy diet and staying active. He reports his  weight has been stable.   His last LDL was elevated at 103 on 06/23/12. He is not currently on cholesterol lowering medication.     Past Medical History  Diagnosis Date  . Hypertension   . Systolic CHF, chronic   . Obesity   . Nonischemic cardiomyopathy     a.  echo 4/06: EF 30%, mild to mod MR, mild RAE, inf HK, lat HK , ant HK;    b.  cath 4/06: no CAD, EF 20-25%  . ED (erectile dysfunction)   . NSVT (nonsustained ventricular tachycardia)     eval by EP in past; no ICD candidate due to NYHA 1 symptoms  . Automatic implantable cardioverter-defibrillator in situ   . Exertional shortness of breath    Current Outpatient Prescriptions  Medication Sig Dispense Refill  . acetaminophen-codeine (TYLENOL #3) 300-30 MG per tablet Take 1-2 tablets by mouth every 6 (six) hours as needed for moderate pain. (Patient not taking: Reported on 11/30/2014) 15 tablet 0  . amiodarone (PACERONE) 200 MG tablet TAKE 1 TABLET BY MOUTH DAILY 30 tablet 2  . carvedilol (COREG) 12.5 MG tablet Take 1 tablet (12.5 mg total) by mouth 2 (two) times daily with a meal. 60 tablet 3  . clotrimazole-betamethasone (LOTRISONE) cream Apply 1 application topically 2 (two) times daily. 45 g 0  . hydrALAZINE (APRESOLINE) 25 MG tablet Take 1 tablet (25 mg total) by mouth 3 (three) times daily. 90 tablet 1  . isosorbide mononitrate (IMDUR) 30 MG 24 hr tablet Take 1 tablet (30 mg total) by mouth daily. 30 tablet 3  . lisinopril (  PRINIVIL,ZESTRIL) 5 MG tablet Take 1 tablet (5 mg total) by mouth 2 (two) times daily. 60 tablet 3  . metolazone (ZAROXOLYN) 2.5 MG tablet Take 1 tablet (2.5 mg total) by mouth as needed. 8 tablet 3  . potassium chloride SA (K-DUR,KLOR-CON) 20 MEQ tablet Take 1 tablet (20 mEq total) by mouth daily. 30 tablet 3  . spironolactone (ALDACTONE) 25 MG tablet Take 0.5 tablets (12.5 mg total) by mouth daily. 15 tablet 3  . torsemide (DEMADEX) 20 MG tablet Take 3 tablets (60 mg total) by mouth 2 (two) times daily. 180  tablet 3   No current facility-administered medications for this visit.   Family History  Problem Relation Age of Onset  . Coronary artery disease Mother 43    s/p PCI  . Lung cancer Father   . Diabetes type II Maternal Uncle   . Coronary artery disease Maternal Uncle    History   Social History  . Marital Status: Legally Separated    Spouse Name: N/A    Number of Children: 5  . Years of Education: N/A   Occupational History  . unemployed    Social History Main Topics  . Smoking status: Current Every Day Smoker -- 0.12 packs/day for 8 years    Types: Cigarettes  . Smokeless tobacco: Never Used  . Alcohol Use: 0.0 oz/week     Comment: 08/23/2013  "rarely have a beer anymore; last beer was a long time ago"  . Drug Use: Yes    Special: Cocaine     Comment: 11/30/14 today  . Sexual Activity: Not Currently   Other Topics Concern  . Not on file   Social History Narrative   Review of Systems: Review of Systems  Constitutional: Negative for fever, chills and weight loss.  HENT: Negative for congestion.   Eyes: Positive for blurred vision.  Respiratory: Positive for shortness of breath (with exertion ). Negative for cough and wheezing.   Cardiovascular: Positive for leg swelling (chronic b/l LE). Negative for chest pain, palpitations and orthopnea.  Gastrointestinal: Positive for diarrhea (loose stools). Negative for nausea, vomiting, abdominal pain, constipation and blood in stool.  Genitourinary: Negative for dysuria, urgency, frequency and hematuria.       Polyuria  Neurological: Positive for sensory change (peripheral neuropathy). Negative for dizziness and headaches.  Endo/Heme/Allergies: Positive for polydipsia.  Psychiatric/Behavioral: Positive for substance abuse (cocaine).    Objective:  Physical Exam: Filed Vitals:   12/11/14 1334  BP: 116/72  Pulse: 86  Temp: 98.2 F (36.8 C)  Height: 6\' 3"  (1.905 m)  Weight: 339 lb 4.8 oz (153.905 kg)  SpO2: 98%     Physical Exam  Constitutional: He is oriented to person, place, and time. He appears well-developed and well-nourished. No distress.  HENT:  Head: Normocephalic and atraumatic.  Right Ear: External ear normal.  Left Ear: External ear normal.  Nose: Nose normal.  Mouth/Throat: Oropharynx is clear and moist. No oropharyngeal exudate.  Eyes: Conjunctivae and EOM are normal. Pupils are equal, round, and reactive to light. Right eye exhibits no discharge. Left eye exhibits no discharge. No scleral icterus.  Neck: Normal range of motion. Neck supple.  Cardiovascular: Normal rate, regular rhythm and normal heart sounds.   Pulmonary/Chest: Effort normal and breath sounds normal. No respiratory distress. He has no wheezes. He has no rales.  Abdominal: Soft. Bowel sounds are normal. He exhibits no distension. There is no tenderness. There is no rebound and no guarding.  Musculoskeletal: Normal range of  motion. He exhibits edema (trace b/l LE). He exhibits no tenderness.  Neurological: He is alert and oriented to person, place, and time.  Normal sensation to light touch of extremities.  Skin: Skin is warm and dry. No rash noted. He is not diaphoretic. No erythema. No pallor.  Psychiatric: He has a normal mood and affect. His behavior is normal. Judgment and thought content normal.    Assessment & Plan:   Please see problem list for problem-based assessment and plan

## 2014-12-11 DIAGNOSIS — Z0000 Encounter for general adult medical examination without abnormal findings: Secondary | ICD-10-CM | POA: Insufficient documentation

## 2014-12-11 MED ORDER — ATORVASTATIN CALCIUM 40 MG PO TABS: 40.0000 mg | ORAL_TABLET | Freq: Every day | ORAL | Status: DC

## 2014-12-11 NOTE — Progress Notes (Signed)
Internal Medicine Clinic Attending  Case discussed with Dr. Rabbani soon after the resident saw the patient.  We reviewed the resident's history and exam and pertinent patient test results.  I agree with the assessment, diagnosis, and plan of care documented in the resident's note.  

## 2014-12-11 NOTE — Assessment & Plan Note (Signed)
Assessment: Pt with last lipid panel on 06/23/12 with elevated LDL (103) cholesterol not on statin therapy with worsening LDL cholesterol and total cholesterol with 10-yr ASCVD risk of 14.5% with recommendations to start moderate to high intensity statin therapy.   Plan: -Obtain annual lipid panel---> LDL worsened from 103 to 154 and total cholesterol elevated at 235 -Prescribe atorvastatin 40 mg daily (high intensity statin) -Last CMP on 3/1/715 normal, repeat at next visit  -Monitor for myalgias

## 2014-12-11 NOTE — Assessment & Plan Note (Signed)
Assessment: Pt with well-controlled hypertension compliant with four-class (BB, ACEi, diuretic, nitrate) anti-hypertensive therapy who presents with blood pressure of 116/72.  Plan:  -BP 116/72 at goal <140/90 -Continue lisinopril 5 mg BID, carvedilol 12.5 mg BID, hydralazine 25 mg TID, imdur 30 mg daily, torsemide 60 mg BID, and spironolactone 12.5 mg daily  -Obtain BMP ---> normal K and stable CKD Stage 2

## 2014-12-11 NOTE — Assessment & Plan Note (Addendum)
Assessment: Pt with last A1c of 6.1 on 06/22/12 not on medical therapy who presents with symptoms of hyperglycemia and worsened A1c of 7.5.   Plan:  -A1c 7.5 not at goal <7, prescribe metformin 500 mg BID (CKD Stage 2 stable with GFR 71)  -BP 116/72 at goal <140/90, continue lisinopril 5 mg BID, carvedilol 12.5 mg BID, hydralazine 25 mg TID, imdur 30 mg daily, torsemide 60 mg BID, and spironolactone 12.5 mg daily  -LDL 154 not at goal <100, start atorvastatin 40 mg daily due to 14.5 % ASCVD risk  -Perform annual eye exam and foot exam today  -Pt could not provide urine sample, obtain annual urine microalbumin at next visit  -Refer to podiatry for nail care  -BMI 42.41 not at goal <30, encourage weight loss

## 2014-12-11 NOTE — Assessment & Plan Note (Addendum)
Asessment: Pt with 1st onset of asymptomatic ICD discharge on 11/30/14 with possible nonsustained ventricular fibrillation who presents with no further firings.  Plan:  -Continue amiodarone 200 mg daily, lisinopril 5 mg BID, carvedilol 12.5 mg BID, hydralazine 25 mg TID, imdur 30 mg daily, torsemide 60 mg BID, spironolactone 12.5 mg daily, and kdur 20 mEq daily -Obtain BMP & Mg level ----> normal K & Mg   -Pt to have next ICD device check in April

## 2014-12-11 NOTE — Assessment & Plan Note (Signed)
Inquire about tdap vaccination at next visit.

## 2014-12-11 NOTE — Assessment & Plan Note (Addendum)
Assessment: Pt with last 2D-echo on 05/01/14 with combined CHF compliant with medical therapy who presents with no acute exacerbation.   Plan:  -Continue amiodarone 200 mg daily, lisinopril 5 mg BID, carvedilol 12.5 mg BID, hydralazine 25 mg TID, imdur 30 mg daily, torsemide 60 mg BID, and spironolactone 12.5 mg daily  -Continue kdur 20 mEq daily  -Obtain BMP ---> K normal with stable CKD Stage 2  -Obtain Mg level ---> normal    -Pt to follow-up with cardiology and CHF clinic soon

## 2014-12-16 ENCOUNTER — Encounter: Payer: Self-pay | Admitting: *Deleted

## 2014-12-19 ENCOUNTER — Encounter: Payer: Self-pay | Admitting: *Deleted

## 2014-12-19 ENCOUNTER — Ambulatory Visit (INDEPENDENT_AMBULATORY_CARE_PROVIDER_SITE_OTHER): Payer: Medicaid Other | Admitting: Physician Assistant

## 2014-12-19 ENCOUNTER — Encounter: Payer: Self-pay | Admitting: Physician Assistant

## 2014-12-19 VITALS — BP 110/80 | HR 72 | Ht 75.0 in | Wt 340.0 lb

## 2014-12-19 DIAGNOSIS — Z95810 Presence of automatic (implantable) cardiac defibrillator: Secondary | ICD-10-CM

## 2014-12-19 DIAGNOSIS — I429 Cardiomyopathy, unspecified: Secondary | ICD-10-CM

## 2014-12-19 DIAGNOSIS — F1910 Other psychoactive substance abuse, uncomplicated: Secondary | ICD-10-CM

## 2014-12-19 DIAGNOSIS — I472 Ventricular tachycardia, unspecified: Secondary | ICD-10-CM

## 2014-12-19 DIAGNOSIS — I5022 Chronic systolic (congestive) heart failure: Secondary | ICD-10-CM

## 2014-12-19 DIAGNOSIS — I428 Other cardiomyopathies: Secondary | ICD-10-CM

## 2014-12-19 DIAGNOSIS — I5042 Chronic combined systolic (congestive) and diastolic (congestive) heart failure: Secondary | ICD-10-CM

## 2014-12-19 DIAGNOSIS — I10 Essential (primary) hypertension: Secondary | ICD-10-CM

## 2014-12-19 NOTE — Patient Instructions (Signed)
Your physician recommends that you schedule a follow-up appointment in: 3 MONTHS WITH DR. Graciela Husbands  Your physician recommends that you schedule a follow-up appointment in: NEXT AVAILABLE WITH DR. [name]  Your physician recommends that you continue on your current medications as directed. Please refer to the Current Medication list given to you today.

## 2014-12-19 NOTE — Progress Notes (Signed)
Cardiology Office Note   Date:  12/19/2014   ID:  Douglas [name], DOB 05/27/1976, MRN [id number]  PCP:  Douglas Brace, MD  Cardiologist:  Dr. Arvilla [name] (CHF clinic)  Electrophysiologist:  Dr. Sherryl [name]   Chief Complaint  Patient presents with  . Hospitalization Follow-up    s/p ICD shock   . Cardiomyopathy     History of Present Illness: Douglas [name] is a 39 y.o. male with a hx of HTN, HL, obesity, nonischemic cardiomyopathy, systolic HF, NSVT, status post [employer] AICD. Most recent ejection fraction 35-40% by echocardiogram in June 2015. Recently admitted 1/16-1/17 and was seen in the hospital by cardiology. Patient received 2 shocks from his ICD in setting of alcohol use and cocaine use.  He saw Dr. Wyline [name]. Notes indicate that device interrogation demonstrated shocks 2 with successful termination of VF. Patient had mild elevation in his troponins. This was felt to be due to ICD shock versus cocaine induced.  Patient was noted to have hypokalemia. This was replaced. He was also given magnesium supplementation with improvement in his levels.  Recent Labs  11/30/14 1516 11/30/14 2205  TROPONINI 0.30* 0.31*    Since discharge, he is been doing well. He denies any recurrent cocaine abuse. He denies any recurrent ICD therapies. He has had some atypical chest pain. He's felt some flank pain as well. He feels like this is musculoskeletal pain from when he was shocked by his defibrillator. It all seems to be positional. He denies significant changes in his dyspnea. He is NYHA 2-2b. He denies orthopnea or PND. He denies any significant change in his LE edema which is mild. He denies syncope.     Studies/Reports Reviewed Today:  Echocardiogram 05/01/14 Mild LVH. EF 35% to 40%. Diffuse hypokinesis. Grade 1 diastolic dysfunction.  Mod LAE, Mild RVE   Right heart catheterization 05/03/13  RA = 2 RV = 29/2/2 PA = 26/10 (17) PCW = 3 Fick cardiac output/index =  6.1/2.3 PVR = 2.3 FA sat = 93% PA sat = 65%, 67%   Left heart catheterization 09/20/11 Left main: Short. Mild plaquing LAD: Mild to moderate diffuse plaquing without high grade stenosis LCX: Large vessel giving off large OM1. Mild diffuse plaquing RCA: Dominant vessel. 30% prox. Otherwise mild plaquing. LV-gram done in the RAO projection: Markedly dilated with global hypokinesis. EF = 5-10%     Past Medical History  Diagnosis Date  . Hypertension   . Systolic CHF, chronic   . Obesity   . Nonischemic cardiomyopathy     a.  echo 4/06: EF 30%, mild to mod MR, mild RAE, inf HK, lat HK , ant HK;    b.  cath 4/06: no CAD, EF 20-25%  . ED (erectile dysfunction)   . NSVT (nonsustained ventricular tachycardia)     eval by EP in past; no ICD candidate due to NYHA 1 symptoms  . Automatic implantable cardioverter-defibrillator in situ   . Exertional shortness of breath     Past Surgical History  Procedure Laterality Date  . Multiple extractions with alveoloplasty N/A 01/26/2013    Procedure:  EXTRACION  TOOTH # 19 WITH ALVEOLOPLASTY;  Surgeon: Douglas [name], DDS;  Location: [facility] OR;  Service: Oral Surgery;  Laterality: N/A;  . Cardiac defibrillator placement  08/23/2013  . Cardiac catheterization  09/2011; 02/2013; 04/2013  . Left and right heart catheterization with coronary angiogram N/A 09/20/2011    Procedure: LEFT AND RIGHT HEART CATHETERIZATION WITH CORONARY ANGIOGRAM;  Surgeon:  Douglas Patty, MD;  Location: [facility] CATH LAB;  Service: Cardiovascular;  Laterality: N/A;  . Right heart catheterization N/A 02/22/2013    Procedure: RIGHT HEART CATH;  Surgeon: Douglas Patty, MD;  Location: [facility] CATH LAB;  Service: Cardiovascular;  Laterality: N/A;  . Right heart catheterization N/A 05/03/2013    Procedure: RIGHT HEART CATH;  Surgeon: Douglas Patty, MD;  Location: [facility] CATH LAB;  Service: Cardiovascular;  Laterality: N/A;  . Implantable cardioverter defibrillator implant N/A 08/23/2013     Procedure: IMPLANTABLE CARDIOVERTER DEFIBRILLATOR IMPLANT;  Surgeon: Douglas Salvia, MD;  Location: [facility] CATH LAB;  Service: Cardiovascular;  Laterality: N/A;     Current Outpatient Prescriptions  Medication Sig Dispense Refill  . acetaminophen-codeine (TYLENOL #3) 300-30 MG per tablet Take 1-2 tablets by mouth every 6 (six) hours as needed for moderate pain. 15 tablet 0  . amiodarone (PACERONE) 200 MG tablet TAKE 1 TABLET BY MOUTH DAILY 30 tablet 2  . atorvastatin (LIPITOR) 40 MG tablet Take 1 tablet (40 mg total) by mouth daily. 30 tablet 5  . carvedilol (COREG) 12.5 MG tablet Take 1 tablet (12.5 mg total) by mouth 2 (two) times daily with a meal. 60 tablet 3  . clotrimazole-betamethasone (LOTRISONE) cream Apply 1 application topically 2 (two) times daily. 45 g 0  . hydrALAZINE (APRESOLINE) 25 MG tablet Take 1 tablet (25 mg total) by mouth 3 (three) times daily. 90 tablet 1  . isosorbide mononitrate (IMDUR) 30 MG 24 hr tablet Take 1 tablet (30 mg total) by mouth daily. 30 tablet 3  . lisinopril (PRINIVIL,ZESTRIL) 5 MG tablet Take 1 tablet (5 mg total) by mouth 2 (two) times daily. 60 tablet 3  . metFORMIN (GLUCOPHAGE) 500 MG tablet Take 1 tablet (500 mg total) by mouth 2 (two) times daily with a meal. 60 tablet 3  . metolazone (ZAROXOLYN) 2.5 MG tablet Take 1 tablet (2.5 mg total) by mouth as needed. 8 tablet 3  . potassium chloride SA (K-DUR,KLOR-CON) 20 MEQ tablet Take 1 tablet (20 mEq total) by mouth daily. 30 tablet 3  . spironolactone (ALDACTONE) 25 MG tablet Take 0.5 tablets (12.5 mg total) by mouth daily. 15 tablet 3  . torsemide (DEMADEX) 20 MG tablet Take 3 tablets (60 mg total) by mouth 2 (two) times daily. 180 tablet 3   No current facility-administered medications for this visit.    Allergies:   Penicillins    Social History:  The patient  reports that he has been smoking Cigarettes.  He has a .96 pack-year smoking history. He has never used smokeless tobacco. He reports that  he drinks alcohol. He reports that he uses illicit drugs (Cocaine).   Family History:  The patient's family history includes Coronary artery disease in his maternal uncle; Coronary artery disease (age of onset: 71) in his mother; Diabetes type II in his maternal uncle; Lung cancer in his father. There is no history of Stroke or Heart attack.    ROS:  Please see the history of present illness.   Otherwise, review of systems are positive for nonproductive cough from recent URI, snoring, fatigue, headaches with his recent URI, back pain.   All other systems are reviewed and negative.    PHYSICAL EXAM: VS:  BP 110/80 mmHg  Pulse 72  Ht 6\' 3"  (1.905 m)  Wt 340 lb (154.223 kg)  BMI 42.50 kg/m2    Wt Readings from Last 3 Encounters:  12/19/14 340 lb (154.223 kg)  12/11/14 339 lb 4.8  oz (153.905 kg)  12/01/14 327 lb 9.6 oz (148.598 kg)     GEN: Well nourished, well developed, in no acute distress HEENT: normal Neck: I cannot appreciate JVD, no masses Cardiac:  Normal S1/S2, RRR; no murmur, no rubs or gallops, trace edema  Respiratory:  clear to auscultation bilaterally, no wheezing, rhonchi or rales. GI: soft, nontender, nondistended, + BS MS: no deformity or atrophy Skin: warm and dry  Neuro:  CNs II-XII intact, Strength and sensation are intact Psych: Normal affect   EKG:  EKG is ordered today.  It demonstrates:   NSR, HR 72, normal axis, no ST changes, no change from prior tracing   Recent Labs: 08/26/2014: ALT 42; TSH 0.948 12/01/2014: Hemoglobin 13.6; Platelets 226 12/10/2014: BUN 16; Creatinine 1.42*; Magnesium 1.9; Potassium 4.4; Sodium 137    Lipid Panel    Component Value Date/Time   CHOL 235* 12/10/2014 1619   TRIG 182* 12/10/2014 1619   HDL 45 12/10/2014 1619   CHOLHDL 5.2 12/10/2014 1619   VLDL 36 12/10/2014 1619   LDLCALC 154* 12/10/2014 1619      ASSESSMENT AND PLAN:  1.  Ventricular Tachycardia:  He had ICD shock x 2 in setting of cocaine use.   Troponin  elevation was likely secondary to ICD shock. We interrogated his device again today. He's had no other ventricular arrhythmias since he was in the hospital. He will remain on amiodarone at his current dose. His recent ALT and TSH were acceptable. He has been advised to remain off cocaine. 2.  Chronic Systolic CHF: Volume remain stable. Continue current therapy. 3.  Nonischemic Cardiomyopathy: He is currently tolerating his current regimen of carvedilol, hydralazine, isosorbide, lisinopril, spironolactone. 4.  S/p AICD: Follow up with EP. 5.  Hypertension: Controlled. Continue current therapy. 6.  Hyperlipidemia: Continue current dose of Lipitor. This was recently initiated by primary care. 7.  Substance abuse: As noted, he has been advised to remain off of cocaine.   Current medicines are reviewed at length with the patient today.  The patient does not have concerns regarding medicines.  The following changes have been made:  no change  Labs/ tests ordered today include:   Orders Placed This Encounter  Procedures  . EKG 12-Lead    Disposition:   FU with Dr. Arvilla [name] 1 month and Dr. Sherryl [name] 3 mos.    Signed, [name], MHS 12/19/2014 11:40 AM    [facility] [address] [place], [place], [place]  [address] Phone: [phone]; Fax: [phone]

## 2015-01-07 ENCOUNTER — Ambulatory Visit (HOSPITAL_COMMUNITY)
Admission: RE | Admit: 2015-01-07 | Discharge: 2015-01-07 | Disposition: A | Discharge status: Home / Self Care | Payer: Medicaid Other | Source: Ambulatory Visit | Attending: Cardiology | Admitting: Cardiology

## 2015-01-07 ENCOUNTER — Encounter (HOSPITAL_COMMUNITY): Payer: Self-pay

## 2015-01-07 VITALS — BP 112/94 | HR 83 | Wt 346.4 lb

## 2015-01-07 DIAGNOSIS — I5022 Chronic systolic (congestive) heart failure: Secondary | ICD-10-CM | POA: Diagnosis present

## 2015-01-07 DIAGNOSIS — F1410 Cocaine abuse, uncomplicated: Secondary | ICD-10-CM | POA: Insufficient documentation

## 2015-01-07 DIAGNOSIS — I5042 Chronic combined systolic (congestive) and diastolic (congestive) heart failure: Secondary | ICD-10-CM

## 2015-01-07 DIAGNOSIS — I428 Other cardiomyopathies: Secondary | ICD-10-CM

## 2015-01-07 DIAGNOSIS — I429 Cardiomyopathy, unspecified: Secondary | ICD-10-CM

## 2015-01-07 DIAGNOSIS — F1490 Cocaine use, unspecified, uncomplicated: Secondary | ICD-10-CM

## 2015-01-07 DIAGNOSIS — I10 Essential (primary) hypertension: Secondary | ICD-10-CM | POA: Diagnosis not present

## 2015-01-07 NOTE — Patient Instructions (Signed)
Follow up 4 months.  Follow up with Primary regarding Lipitor.  Do the following things EVERYDAY: 1) Weigh yourself in the morning before breakfast. Write it down and keep it in a log. 2) Take your medicines as prescribed 3) Eat low salt foods-Limit salt (sodium) to 2000 mg per day.  4) Stay as active as you can everyday 5) Limit all fluids for the day to less than 2 liters

## 2015-01-07 NOTE — Progress Notes (Signed)
Patient ID: Douglas [name], male   DOB: 1976/06/13, 39 y.o.   MRN: [id number]  EP: Dr Graciela Husbands PCP: Trying to find one Primary Cardiologist: Dr. [name]  History of Present Illness: Douglas [name] is a 39 y.o. male with a h/o HTN, HL, obesity, NICM and chronic systolic heart failure. He is S/P Medtronic ICD due to NICM. Management has been complicated by severe non-compliance.   August 2012 CPX VO2 20  Underwent cath in 2006 which showed normal cors with EF 20-25%.   EF had recovered in late 2013 but has again fallen to 15% per echo 01/20/13.  He was admitted to [facility] 02/21/13 from clinic for low output symptoms and volume overload.  He required IV lasix and metolazone.  He diuresed 19 pounds with discharge weight of 254 pounds.  He had runs of VT therefore he was started on amiodarone per EP and Lifevest was placed at discharge.  He underwent RHC on 02/22/13.  He has not required metolazone.   Follow up:  Since the last visit he was admitted in January with VT and ICD shock x2. Doing well. Denies SOB/PND/Orthopnea. Not weighing. Taking all medications.   Trying to follow low salt diet and reports drinking less than 2L a day. Last used cocaine in January 2016.   Optivol: Fluid index below threshold, activity about 5 hours per day. Marland Kitchen   RHC 02/22/13  RA = 12  RV = 49/6/15  PA = 54/29 (41)  PCW = 28 (v= 40)  Fick cardiac output/index = 4.3/1.7  PVR = 2.6 Woods  SVR = 1580 dynes  FA sat = 98%  PA sat = 53%, 54%  Non-invasive BP = 124/86 (97)  RHC 05/03/13 RA = 2  RV = 29/2/2  PA = 26/10 (17)  PCW = 3  Fick cardiac output/index = 6.1/2.3  PVR = 2.3  FA sat = 93%  PA sat = 65%, 67%  ECHO 06/25/13 EF 20-25% [facility] 05/01/14 EF 35-40%, moderately dilated LV, mild LVH, diffuse hypokinesis  Labs (10/15/13): K 3.9 Creatinine 0.99 Labs (3/15): K 4, creatinine 1.04, TSH normal, LFTs, normal Labs (12/10/2014): K 4.4 Creatinine 1.42   SH: lives with his Mom. No ETOH, smokes rarely. Last used  cocaine in January.  FH: Mom has CAD.    ROS:  Please see the history of present illness.  He denies fevers, chills, melena, hematochezia.  All other systems reviewed and negative.  Past Medical History  Diagnosis Date  . Hypertension   . Systolic CHF, chronic   . Obesity   . Nonischemic cardiomyopathy     a.  echo 4/06: EF 30%, mild to mod MR, mild RAE, inf HK, lat HK , ant HK;    b.  cath 4/06: no CAD, EF 20-25%  . ED (erectile dysfunction)   . NSVT (nonsustained ventricular tachycardia)     eval by EP in past; no ICD candidate due to NYHA 1 symptoms  . Automatic implantable cardioverter-defibrillator in situ   . Exertional shortness of breath     Current Outpatient Prescriptions  Medication Sig Dispense Refill  . amiodarone (PACERONE) 200 MG tablet TAKE 1 TABLET BY MOUTH DAILY 30 tablet 2  . carvedilol (COREG) 12.5 MG tablet Take 1 tablet (12.5 mg total) by mouth 2 (two) times daily with a meal. 60 tablet 3  . clotrimazole-betamethasone (LOTRISONE) cream Apply 1 application topically 2 (two) times daily. 45 g 0  . hydrALAZINE (APRESOLINE) 25 MG tablet Take 1 tablet (  25 mg total) by mouth 3 (three) times daily. 90 tablet 1  . isosorbide mononitrate (IMDUR) 30 MG 24 hr tablet Take 1 tablet (30 mg total) by mouth daily. 30 tablet 3  . lisinopril (PRINIVIL,ZESTRIL) 5 MG tablet Take 1 tablet (5 mg total) by mouth 2 (two) times daily. 60 tablet 3  . metFORMIN (GLUCOPHAGE) 500 MG tablet Take 1 tablet (500 mg total) by mouth 2 (two) times daily with a meal. 60 tablet 3  . metolazone (ZAROXOLYN) 2.5 MG tablet Take 1 tablet (2.5 mg total) by mouth as needed. 8 tablet 3  . potassium chloride SA (K-DUR,KLOR-CON) 20 MEQ tablet Take 1 tablet (20 mEq total) by mouth daily. 30 tablet 3  . spironolactone (ALDACTONE) 25 MG tablet Take 0.5 tablets (12.5 mg total) by mouth daily. 15 tablet 3  . torsemide (DEMADEX) 20 MG tablet Take 3 tablets (60 mg total) by mouth 2 (two) times daily. 180 tablet 3  .  acetaminophen-codeine (TYLENOL #3) 300-30 MG per tablet Take 1-2 tablets by mouth every 6 (six) hours as needed for moderate pain. (Patient not taking: Reported on 01/07/2015) 15 tablet 0   No current facility-administered medications for this encounter.    Allergies  Allergen Reactions  . Penicillins Other (See Comments)    Unknown childhood reaction     Filed Vitals:   01/07/15 0844  BP: 112/94  Pulse: 83  Weight: 346 lb 6.4 oz (157.126 kg)  SpO2: 96%    PHYSICAL EXAM: General: Well nourished, well developed, in no acute distress  HEENT: normal Neck: JVP hard to tell due thick neck but does not appear elevated.Carotids 2+ bilaterally.No Bruits  Cardiac:  PMI laterally displaced. Regular rate and rhythm,  Lungs:  clear to auscultation bilaterally, no wheezing, rhonchi or rales Ab: obese. Soft NT.  Non-distended.  No bruits. Ext warm. no cyanosis or clubbing. Trace lower extremity edema.  Skin: warm and dry Psych:normal affect. CNII-XII grossly intact  1) Chronic systolic HF: Nonischemic cardiomyopathy, EF 25-30% (06/2013) -> 35-40% (05/01/14) S/P Medtronic ICD 08/2013. Overall he is doing well. NYHA II. Volume status stable. Confirmed by Optivol and activity ~5 hours per day.   - Continue Torsemide 60 mg bid with KCl 20 meq daily.   - Continue Coreg 12.5 bid.  - Continue hydralazine 25 mg tid/Imdur 30 mg daily - Continue lisinopril 5 bid.  - Continue spironolactone 12.5 mg daily . Most recent BMET January 26 th stable.    - Reinforced daily weights, low salt food choices, and limiting fluid intake to < 2 liters per day  2) HTN: Well-controlled BP. Continue current regimen.  3) VT with ICD shock x2 --> January thought to be from cocaine.  Continue amiodarone 200 mg daily. Needs eye exam yearly. 4) Cocaine abuse-  Last used in January. I have told to he need to remain off cocaine. He declines substance abuse counseling.     Follow up in 4 months   CLEGG,AMY NP-C   01/07/2015 8:58 AM

## 2015-01-23 ENCOUNTER — Encounter: Payer: Self-pay | Admitting: Internal Medicine

## 2015-01-23 ENCOUNTER — Encounter: Payer: Self-pay | Admitting: *Deleted

## 2015-01-29 ENCOUNTER — Observation Stay (HOSPITAL_COMMUNITY)
Admission: EM | Admit: 2015-01-29 | Discharge: 2015-01-30 | Disposition: A | Discharge status: Home / Self Care | Payer: Medicaid Other | Attending: Internal Medicine | Admitting: Internal Medicine

## 2015-01-29 ENCOUNTER — Emergency Department (HOSPITAL_COMMUNITY): Payer: Medicaid Other

## 2015-01-29 ENCOUNTER — Encounter (HOSPITAL_COMMUNITY): Payer: Self-pay | Admitting: *Deleted

## 2015-01-29 DIAGNOSIS — R7989 Other specified abnormal findings of blood chemistry: Secondary | ICD-10-CM | POA: Diagnosis present

## 2015-01-29 DIAGNOSIS — F1410 Cocaine abuse, uncomplicated: Secondary | ICD-10-CM | POA: Diagnosis not present

## 2015-01-29 DIAGNOSIS — R0789 Other chest pain: Secondary | ICD-10-CM | POA: Diagnosis not present

## 2015-01-29 DIAGNOSIS — R109 Unspecified abdominal pain: Secondary | ICD-10-CM

## 2015-01-29 DIAGNOSIS — I255 Ischemic cardiomyopathy: Secondary | ICD-10-CM

## 2015-01-29 DIAGNOSIS — I10 Essential (primary) hypertension: Secondary | ICD-10-CM | POA: Diagnosis present

## 2015-01-29 DIAGNOSIS — M542 Cervicalgia: Secondary | ICD-10-CM | POA: Diagnosis not present

## 2015-01-29 DIAGNOSIS — I5042 Chronic combined systolic (congestive) and diastolic (congestive) heart failure: Secondary | ICD-10-CM | POA: Diagnosis present

## 2015-01-29 DIAGNOSIS — R55 Syncope and collapse: Secondary | ICD-10-CM | POA: Diagnosis not present

## 2015-01-29 DIAGNOSIS — Z95810 Presence of automatic (implantable) cardiac defibrillator: Secondary | ICD-10-CM | POA: Diagnosis present

## 2015-01-29 DIAGNOSIS — F17210 Nicotine dependence, cigarettes, uncomplicated: Secondary | ICD-10-CM | POA: Insufficient documentation

## 2015-01-29 DIAGNOSIS — N182 Chronic kidney disease, stage 2 (mild): Secondary | ICD-10-CM | POA: Insufficient documentation

## 2015-01-29 DIAGNOSIS — I4729 Other ventricular tachycardia: Secondary | ICD-10-CM

## 2015-01-29 DIAGNOSIS — F1490 Cocaine use, unspecified, uncomplicated: Secondary | ICD-10-CM | POA: Diagnosis present

## 2015-01-29 DIAGNOSIS — E669 Obesity, unspecified: Secondary | ICD-10-CM | POA: Diagnosis present

## 2015-01-29 DIAGNOSIS — I129 Hypertensive chronic kidney disease with stage 1 through stage 4 chronic kidney disease, or unspecified chronic kidney disease: Secondary | ICD-10-CM | POA: Insufficient documentation

## 2015-01-29 DIAGNOSIS — R079 Chest pain, unspecified: Secondary | ICD-10-CM | POA: Insufficient documentation

## 2015-01-29 DIAGNOSIS — I472 Ventricular tachycardia, unspecified: Secondary | ICD-10-CM

## 2015-01-29 DIAGNOSIS — I429 Cardiomyopathy, unspecified: Secondary | ICD-10-CM | POA: Insufficient documentation

## 2015-01-29 DIAGNOSIS — E119 Type 2 diabetes mellitus without complications: Secondary | ICD-10-CM

## 2015-01-29 DIAGNOSIS — Z87898 Personal history of other specified conditions: Secondary | ICD-10-CM

## 2015-01-29 DIAGNOSIS — Z0389 Encounter for observation for other suspected diseases and conditions ruled out: Secondary | ICD-10-CM

## 2015-01-29 DIAGNOSIS — Z6841 Body Mass Index (BMI) 40.0 and over, adult: Secondary | ICD-10-CM | POA: Diagnosis not present

## 2015-01-29 DIAGNOSIS — E1122 Type 2 diabetes mellitus with diabetic chronic kidney disease: Secondary | ICD-10-CM | POA: Diagnosis present

## 2015-01-29 DIAGNOSIS — R778 Other specified abnormalities of plasma proteins: Secondary | ICD-10-CM | POA: Diagnosis present

## 2015-01-29 DIAGNOSIS — I428 Other cardiomyopathies: Secondary | ICD-10-CM

## 2015-01-29 DIAGNOSIS — I5022 Chronic systolic (congestive) heart failure: Secondary | ICD-10-CM | POA: Diagnosis not present

## 2015-01-29 DIAGNOSIS — E785 Hyperlipidemia, unspecified: Secondary | ICD-10-CM | POA: Diagnosis not present

## 2015-01-29 DIAGNOSIS — IMO0001 Reserved for inherently not codable concepts without codable children: Secondary | ICD-10-CM

## 2015-01-29 DIAGNOSIS — R1013 Epigastric pain: Secondary | ICD-10-CM | POA: Diagnosis present

## 2015-01-29 DIAGNOSIS — N179 Acute kidney failure, unspecified: Secondary | ICD-10-CM

## 2015-01-29 DIAGNOSIS — I5020 Unspecified systolic (congestive) heart failure: Secondary | ICD-10-CM

## 2015-01-29 HISTORY — DX: Personal history of other specified conditions: Z87.898

## 2015-01-29 LAB — CBC WITH DIFFERENTIAL/PLATELET
Basophils Absolute: 0 10*3/uL (ref 0.0–0.1)
Basophils Relative: 0 % (ref 0–1)
Eosinophils Absolute: 0.3 10*3/uL (ref 0.0–0.7)
Eosinophils Relative: 3 % (ref 0–5)
HCT: 43.5 % (ref 39.0–52.0)
Hemoglobin: 14.9 g/dL (ref 13.0–17.0)
Lymphocytes Relative: 33 % (ref 12–46)
Lymphs Abs: 2.6 10*3/uL (ref 0.7–4.0)
MCH: 30.3 pg (ref 26.0–34.0)
MCHC: 34.3 g/dL (ref 30.0–36.0)
MCV: 88.4 fL (ref 78.0–100.0)
Monocytes Absolute: 1.3 10*3/uL — ABNORMAL HIGH (ref 0.1–1.0)
Monocytes Relative: 17 % — ABNORMAL HIGH (ref 3–12)
Neutro Abs: 3.7 10*3/uL (ref 1.7–7.7)
Neutrophils Relative %: 47 % (ref 43–77)
Platelets: 208 10*3/uL (ref 150–400)
RBC: 4.92 MIL/uL (ref 4.22–5.81)
RDW: 13.6 % (ref 11.5–15.5)
WBC: 7.9 10*3/uL (ref 4.0–10.5)

## 2015-01-29 LAB — GLUCOSE, CAPILLARY
GLUCOSE-CAPILLARY: 202 mg/dL — AB (ref 70–99)
Glucose-Capillary: 160 mg/dL — ABNORMAL HIGH (ref 70–99)
Glucose-Capillary: 151 mg/dL — ABNORMAL HIGH (ref 70–99)

## 2015-01-29 LAB — COMPREHENSIVE METABOLIC PANEL
ALT: 54 U/L — ABNORMAL HIGH (ref 0–53)
AST: 43 U/L — ABNORMAL HIGH (ref 0–37)
Albumin: 3.7 g/dL (ref 3.5–5.2)
Alkaline Phosphatase: 100 U/L (ref 39–117)
Anion gap: 9 (ref 5–15)
BUN: 12 mg/dL (ref 6–23)
CO2: 28 mmol/L (ref 19–32)
Calcium: 8.9 mg/dL (ref 8.4–10.5)
Chloride: 98 mmol/L (ref 96–112)
Creatinine, Ser: 1.27 mg/dL (ref 0.50–1.35)
GFR calc Af Amer: 80 mL/min — ABNORMAL LOW (ref >90)
GFR calc non Af Amer: 69 mL/min — ABNORMAL LOW (ref >90)
Glucose, Bld: 190 mg/dL — ABNORMAL HIGH (ref 70–99)
Potassium: 3.5 mmol/L (ref 3.5–5.1)
Sodium: 135 mmol/L (ref 135–145)
Total Bilirubin: 0.6 mg/dL (ref 0.3–1.2)
Total Protein: 7.5 g/dL (ref 6.0–8.3)

## 2015-01-29 LAB — BRAIN NATRIURETIC PEPTIDE: B Natriuretic Peptide: 37.6 pg/mL (ref 0.0–100.0)

## 2015-01-29 LAB — RAPID URINE DRUG SCREEN, HOSP PERFORMED
AMPHETAMINES: NOT DETECTED
BARBITURATES: NOT DETECTED
Benzodiazepines: NOT DETECTED
Cocaine: POSITIVE — AB
OPIATES: NOT DETECTED
TETRAHYDROCANNABINOL: NOT DETECTED

## 2015-01-29 LAB — URINALYSIS, ROUTINE W REFLEX MICROSCOPIC
Bilirubin Urine: NEGATIVE
Glucose, UA: NEGATIVE mg/dL
Hgb urine dipstick: NEGATIVE
Ketones, ur: NEGATIVE mg/dL
Leukocytes, UA: NEGATIVE
Nitrite: NEGATIVE
Protein, ur: NEGATIVE mg/dL
Specific Gravity, Urine: 1.008 (ref 1.005–1.030)
Urobilinogen, UA: 0.2 mg/dL (ref 0.0–1.0)
pH: 6 (ref 5.0–8.0)

## 2015-01-29 LAB — TROPONIN I
TROPONIN I: 0.2 ng/mL — AB (ref <0.031)
Troponin I: 0.17 ng/mL — ABNORMAL HIGH (ref <0.031)

## 2015-01-29 LAB — D-DIMER, QUANTITATIVE: D-Dimer, Quant: 0.4 ug/mL-FEU (ref 0.00–0.48)

## 2015-01-29 LAB — LIPASE, BLOOD: Lipase: 36 U/L (ref 11–59)

## 2015-01-29 MED ORDER — HYDRALAZINE HCL 25 MG PO TABS
25.0000 mg | ORAL_TABLET | Freq: Three times a day (TID) | ORAL | Status: DC
  Administered 2015-01-29 – 2015-01-30 (×4): 25 mg via ORAL
  Filled 2015-01-29 (×5): qty 1

## 2015-01-29 MED ORDER — CARVEDILOL 12.5 MG PO TABS
12.5000 mg | ORAL_TABLET | Freq: Two times a day (BID) | ORAL | Status: DC
  Administered 2015-01-29 – 2015-01-30 (×3): 12.5 mg via ORAL
  Filled 2015-01-29 (×4): qty 1

## 2015-01-29 MED ORDER — ACETAMINOPHEN 650 MG RE SUPP: 650.0000 mg | Freq: Four times a day (QID) | RECTAL | Status: DC | PRN

## 2015-01-29 MED ORDER — ENOXAPARIN SODIUM 80 MG/0.8ML ~~LOC~~ SOLN
70.0000 mg | SUBCUTANEOUS | Status: DC
  Administered 2015-01-29: 70 mg via SUBCUTANEOUS
  Filled 2015-01-29 (×2): qty 0.8

## 2015-01-29 MED ORDER — INSULIN ASPART 100 UNIT/ML ~~LOC~~ SOLN
0.0000 [IU] | Freq: Three times a day (TID) | SUBCUTANEOUS | Status: DC
  Administered 2015-01-29: 3 [IU] via SUBCUTANEOUS
  Administered 2015-01-30: 5 [IU] via SUBCUTANEOUS
  Administered 2015-01-30: 2 [IU] via SUBCUTANEOUS
  Administered 2015-01-30: 3 [IU] via SUBCUTANEOUS

## 2015-01-29 MED ORDER — AMIODARONE HCL 200 MG PO TABS
200.0000 mg | ORAL_TABLET | Freq: Every day | ORAL | Status: DC
  Administered 2015-01-29 – 2015-01-30 (×2): 200 mg via ORAL
  Filled 2015-01-29 (×2): qty 1

## 2015-01-29 MED ORDER — ACETAMINOPHEN 325 MG PO TABS
650.0000 mg | ORAL_TABLET | Freq: Four times a day (QID) | ORAL | Status: DC | PRN
Start: 2015-01-29 — End: 2015-01-30

## 2015-01-29 MED ORDER — POTASSIUM CHLORIDE CRYS ER 20 MEQ PO TBCR
20.0000 meq | EXTENDED_RELEASE_TABLET | Freq: Every day | ORAL | Status: DC
  Administered 2015-01-29 – 2015-01-30 (×2): 20 meq via ORAL
  Filled 2015-01-29 (×2): qty 1

## 2015-01-29 MED ORDER — OXYCODONE-ACETAMINOPHEN 5-325 MG PO TABS
1.0000 | ORAL_TABLET | Freq: Four times a day (QID) | ORAL | Status: DC | PRN
  Administered 2015-01-29 – 2015-01-30 (×3): 2 via ORAL
  Filled 2015-01-29 (×3): qty 2

## 2015-01-29 MED ORDER — ISOSORBIDE MONONITRATE ER 30 MG PO TB24
30.0000 mg | ORAL_TABLET | Freq: Every day | ORAL | Status: DC
  Administered 2015-01-29 – 2015-01-30 (×2): 30 mg via ORAL
  Filled 2015-01-29 (×2): qty 1

## 2015-01-29 MED ORDER — ASPIRIN EC 325 MG PO TBEC
325.0000 mg | DELAYED_RELEASE_TABLET | Freq: Every day | ORAL | Status: DC
  Administered 2015-01-29 – 2015-01-30 (×2): 325 mg via ORAL
  Filled 2015-01-29 (×2): qty 1

## 2015-01-29 MED ORDER — MORPHINE SULFATE 2 MG/ML IJ SOLN: 2.0000 mg | INTRAMUSCULAR | Status: DC | PRN

## 2015-01-29 MED ORDER — SPIRONOLACTONE 12.5 MG HALF TABLET
12.5000 mg | ORAL_TABLET | Freq: Every day | ORAL | Status: DC
  Administered 2015-01-30: 12.5 mg via ORAL
  Filled 2015-01-29: qty 1

## 2015-01-29 MED ORDER — LISINOPRIL 5 MG PO TABS
5.0000 mg | ORAL_TABLET | Freq: Two times a day (BID) | ORAL | Status: DC
  Administered 2015-01-29 – 2015-01-30 (×3): 5 mg via ORAL
  Filled 2015-01-29 (×4): qty 1

## 2015-01-29 MED ORDER — SODIUM CHLORIDE 0.9 % IJ SOLN
3.0000 mL | Freq: Two times a day (BID) | INTRAMUSCULAR | Status: DC
  Administered 2015-01-29 – 2015-01-30 (×2): 3 mL via INTRAVENOUS

## 2015-01-29 MED ORDER — TORSEMIDE 20 MG PO TABS
60.0000 mg | ORAL_TABLET | Freq: Two times a day (BID) | ORAL | Status: DC
  Administered 2015-01-29 – 2015-01-30 (×3): 60 mg via ORAL
  Filled 2015-01-29 (×4): qty 3

## 2015-01-29 MED ORDER — INSULIN ASPART 100 UNIT/ML ~~LOC~~ SOLN: 0.0000 [IU] | Freq: Every day | SUBCUTANEOUS | Status: DC

## 2015-01-29 MED ORDER — ENOXAPARIN SODIUM 40 MG/0.4ML ~~LOC~~ SOLN
40.0000 mg | SUBCUTANEOUS | Status: DC
  Filled 2015-01-29: qty 0.4

## 2015-01-29 NOTE — ED Notes (Signed)
Pt leaving for XRay and CT

## 2015-01-29 NOTE — ED Notes (Signed)
Patient stated he used bathroom while at CT, and stated he don't have to go at this time, Pt. aware we need a urinalysis. Pt. will call when he has to go again.

## 2015-01-29 NOTE — ED Notes (Signed)
Pt back from CT

## 2015-01-29 NOTE — H&P (Cosign Needed)
Date: 01/29/2015               Patient Name:  Douglas [name] MRN: [id number]  DOB: June 20, 1976 Age / Sex: 39 y.o., male   PCP: [name], MD              Medical Service: Internal Medicine Teaching Service              Attending Physician: Dr. Aletta Edouard, MD                        After Hours (After 5p/  First Contact Pager: [phone]  weekends / holidays): Second Contact Pager: [phone]   Chief Complaint: Chief Complaint  Patient presents with  . Chest Pain  . Loss of Consciousness   Syncope  HPI: Douglas [name] is a 39 y.o. male with heart failure due to NICM (cath showing no CAD at HF diagnosis in 2006, repeat cath 02/2013 significant biventricular HF with elevated filling pressures and low output, last echo 2015 EF 35-40%, now s/p ICD implantation in 08/2013), HTN, obesity, and cocaine abuse that presented with Syncope.   Pt stated that when he was at his house today  He took a shower and he got out with no problem. He apparently lost conscience and found himself sitting in his chair. He doesn't remember how he got in the chair.  He endorses some moderate chest pain more prominent to the epigastric area that are felt like cramping and not irradiating through they are associated with headache and back and left neck pain. He denies any fall, loss of control of bowel or urine, no seizure like activity, no sweating, nor nausea or vomiting.   Of note pt denies using any recreational drug yesterday or today, he stated that he used cocaine a week ago. Pt denies feeling his ICD firing. Marland Kitchen He stated that the last time it fired he felt it, not this time. The last time it fired was in January in the setting of cocaine use and sexual activity.  In the ED a UDS was collected and was positive for Cocaine.  Pt also reports some mild dizziness where he feel likes the room is spinning around, those symptome improve while laying down and worsen while sitting and standing up.  Pt admit  on smoking cigarettes 3-4 per day, denies alcohol use in the last 3-4 months. Pt stated that he is married and has 4 children.   Review of Systems:  Constitutional: Denies fever, chills, diaphoresis, appetite change  HEENT: Denies photophobia, eye pain, redness, hearing loss, ear pain, congestion, sore throat, rhinorrhea, sneezing, mouth sores, trouble swallowing, neck pain, neck stiffness and tinnitus.  Cardiovascular: Denies palpitations and leg swelling.  Gastrointestinal: Reports an episode of loose stool, nausea and vomiting yesterday.  Denies abdominal pain, constipation, blood in stool and abdominal distention.  Genitourinary: Denies dysuria, urgency, frequency, hematuria, flank pain and difficulty urinating.  Musculoskeletal: Denies myalgias, back pain, joint swelling, arthralgias and gait problem.  Skin: Denies pallor, rash and wound.  Neurological: dizziness. Hematological: Denies adenopathy. Easy bruising, personal or family bleeding history  Psychiatric/Behavioral: Denies suicidal ideation, mood changes, confusion, nervousness, sleep disturbance and agitation   Allergies: Penicillins  Medications:  @HMEDSABBREVIATED @  Medical History: Past Medical History  Diagnosis Date  . Hypertension   . Systolic CHF, chronic   . Obesity   . Nonischemic cardiomyopathy     a.  echo 4/06: EF 30%, mild to mod MR,  mild RAE, inf HK, lat HK , ant HK;    b.  cath 4/06: no CAD, EF 20-25%  . ED (erectile dysfunction)   . NSVT (nonsustained ventricular tachycardia)     eval by EP in past; no ICD candidate due to NYHA 1 symptoms  . Automatic implantable cardioverter-defibrillator in situ   . Exertional shortness of breath     Surgical History: Past Surgical History  Procedure Laterality Date  . Multiple extractions with alveoloplasty N/A 01/26/2013    Procedure:  EXTRACION  TOOTH # 19 WITH ALVEOLOPLASTY;  Surgeon: Charlynne Pander, DDS;  Location: [facility] OR;  Service: Oral Surgery;   Laterality: N/A;  . Cardiac defibrillator placement  08/23/2013  . Cardiac catheterization  09/2011; 02/2013; 04/2013  . Left and right heart catheterization with coronary angiogram N/A 09/20/2011    Procedure: LEFT AND RIGHT HEART CATHETERIZATION WITH CORONARY ANGIOGRAM;  Surgeon: Dolores Patty, MD;  Location: [facility] CATH LAB;  Service: Cardiovascular;  Laterality: N/A;  . Right heart catheterization N/A 02/22/2013    Procedure: RIGHT HEART CATH;  Surgeon: Dolores Patty, MD;  Location: [facility] CATH LAB;  Service: Cardiovascular;  Laterality: N/A;  . Right heart catheterization N/A 05/03/2013    Procedure: RIGHT HEART CATH;  Surgeon: Dolores Patty, MD;  Location: [facility] CATH LAB;  Service: Cardiovascular;  Laterality: N/A;  . Implantable cardioverter defibrillator implant N/A 08/23/2013    Procedure: IMPLANTABLE CARDIOVERTER DEFIBRILLATOR IMPLANT;  Surgeon: Duke Salvia, MD;  Location: [facility] CATH LAB;  Service: Cardiovascular;  Laterality: N/A;    Social History: History   Social History  . Marital Status: Legally Separated    Spouse Name: N/A  . Number of Children: 5  . Years of Education: N/A   Occupational History  . unemployed    Social History Main Topics  . Smoking status: Current Every Day Smoker -- 0.12 packs/day for 8 years    Types: Cigarettes  . Smokeless tobacco: Never Used  . Alcohol Use: 0.0 oz/week    0 Standard drinks or equivalent per week     Comment: 08/23/2013  "rarely have a beer anymore; last beer was a long time ago"  . Drug Use: Yes    Special: Cocaine     Comment: 11/30/14 today,  used cocaine one week ago  . Sexual Activity: Not Currently   Other Topics Concern  . Not on file   Social History Narrative    Family History: Family History  Problem Relation Age of Onset  . Coronary artery disease Mother 59    s/p PCI  . Lung cancer Father   . Diabetes type II Maternal Uncle   . Coronary artery disease Maternal Uncle   . Stroke Neg Hx   . Heart attack  Neg Hx        Labs/Studies: @[id number] @, No intake or output data in the 24 hours ending 01/29/15 1520  Physical Exam: General: Obese, healthy, cooperative, awake and alert,  No acute distress Skin: Skin color, texture, turgor normal. No rashes or lesions. HEENT:  Head : Atraumatic, normocephalic. Eye:   sclera anicteric, no conjunctival injection Nose: no nasal congestion.patent, no discharge Throat: No tonsillar erythema, exudates or enlargement. Mouth: Moist mucus, no lesions, dentition good. Neck: supple, no LAD Heart: Pulse regular rate and rhythm, normal S1S2, no murmurs appreciated.  Chest/ Lung: Normal depth and effort.  No tenderness to palpation Clear lungs sounds No rales, rhonchi, wheezing, or rubs. Abdomen: Obese, Soft, NTND, +BS, no hepatosplenomegaly noted.  Extremities: Warm and well perfused, no clubbing/cyanosis/edema. Radial, pedal pulse +2, no edema. Neuro: CN III-XII grossly intact, sensation intact, spontaneous movement of all limbs, no obvious deficits  Assessment/Plan:  Principal Problem:   Syncope Active Problems:   Diabetes mellitus with stage 2 chronic kidney disease   Dyslipidemia   Essential hypertension   NICM- last EF 35-40%, June 2015   Chronic combined systolic and diastolic congestive heart failure   Cocaine use   ICD (MDT) in place   Epigastric pain   Neck pain, bilateral   Normal coronary arteries-2006, 2012   Elevated troponin   Obesity-BMI 41-negative sleep study in the past   Douglas [name] is a 39 y.o. male with PMHx of non ischemic cardiomyopathy s/p ICD placement , hypertension, cocaine abuse, who presented with Syncope.  # Syncope: pt syncope episode occurred suddenly without any warning signs on pt with significant cardiac history and  history of cocaine abuse. Pt doesn't admit on using cocaine recently but the urine test collected was positive for cocaine. Pt doesn't remember the circumstance that lead him to passing  out and finding himself on his chair. He denies any seizure like activity, nor loss of control of bladder and bowel nor any confusion. Pt did not experience any neurological deficit during the episode. Pt syncopal episode is more likely consistent with cardiogenic syncope. Pt troponin elevated at 0.20, probably related to demand ischemia.   -appreciate cardiology consult. -cardiac monitoring -admit for observation.  -cycle cardiac enzyme -interrogate ICD  # Non Ischemic cardiomyopathy: pt has a documented history NICM s/p ICD placement in 2014, his last Echocardiogram was done on 05/01/14 and showed an EF of 35 to 40% , diffuse hypokinesis and grade 1 diastolic dysfunction. Pt doesn't appear to be decompensated at this time( lungs are clear and there is no edema noted). BNP normal ( 37.6)  At home patient is on Carvedilol 12.5 mg BID,Imdur 30 mg QD, Hydralazine 25 mg TID,  Torsemide 60 mg BID ,spironolactone 12.5 mg daily, and metolazone 2.5 mg as needed for volume overload.  - continue home Torsemide 60 mg QD -continue home spironolactone 12.5 mg daily - continue Coreg 12.5 BID - continue home Imdur 30 mg QD -Continue Hydralazine 25 mg TID -hold metolazone.   # Hypertension: pt home regiment consists of  Carvedilol 12.5 mg BID,Imdur 30 mg QD, Hydralazine 25 mg TID,  Torsemide 60 mg daily ,spironolactone 12.5 mg daily, - continue home Torsemide 60 mg daily -continue home spironolactone 12.5 mg daily - continue Coreg 12.5 twice a day - continue home Imdur 30 mg Daily -Continue Hydralazine 25 mg three times a day.  # cocaine abuse: Although patient denies using cocaine recently and only recall using cocaine a week ago, his urine drug test was positive for cocaine.  -substance abuse counseling.   # Diabetes Mellitus type 2: pt takes Metformin 500 mg BID glycemia is 190 today.In January Hb A1c of 7.5.  -CBG monitoring Q AC -SSI -hold metformin  # Hyperlipidemia: Lipid profile obtained in  January 2016 Total cholesterol 235, Triglyceride 182, HDL 45, LDL 154. Pt is currently not taking any cholesterol lowering agent.  -obtain lipid profile -consider starting pt on cholesterol lowering drug.    This is a Psychologist, occupational Note.  The care of the patient was discussed with Dr. Loma Newton and the assessment and plan was formulated with their assistance.  Please see their note for official documentation of the patient encounter.

## 2015-01-29 NOTE — Consult Note (Signed)
Reason for Consult:   Syncope, elevated troponin  Requesting Physician: ED  HPI: This is a 39 y.o. male with a past medical history significant for NICM. He is s/p MDT ICD 2010 and is on Amiodarone. He has had two prior coronary angiograms- 2006 and 2012- that showed no significant CAD. His EF in 2006 was 15%. It improved to 55% in 2013 but was found to be 25% in 2014. Last echo was June 2015-EF 35-40%. He has been keeping his office appointments but he has a history Cocaine use. He was admitted in Jan 2016 after his defibrillator fired and he admitted using cocaine.        He is admitted now through the ED after a "syncopal spell". His ICD was interrogated and has not fired according to the ER MD. The pt describes not feeling well with headache, anorexia, and back ache x 3-4 days.    PMHx:  Past Medical History  Diagnosis Date  . Hypertension   . Systolic CHF, chronic   . Obesity   . Nonischemic cardiomyopathy     a.  echo 4/06: EF 30%, mild to mod MR, mild RAE, inf HK, lat HK , ant HK;    b.  cath 4/06: no CAD, EF 20-25%  . ED (erectile dysfunction)   . NSVT (nonsustained ventricular tachycardia)     eval by EP in past; no ICD candidate due to NYHA 1 symptoms  . Automatic implantable cardioverter-defibrillator in situ   . Exertional shortness of breath     Past Surgical History  Procedure Laterality Date  . Multiple extractions with alveoloplasty N/A 01/26/2013    Procedure:  EXTRACION  TOOTH # 19 WITH ALVEOLOPLASTY;  Surgeon: Charlynne Pander, DDS;  Location: [facility] OR;  Service: Oral Surgery;  Laterality: N/A;  . Cardiac defibrillator placement  08/23/2013  . Cardiac catheterization  09/2011; 02/2013; 04/2013  . Left and right heart catheterization with coronary angiogram N/A 09/20/2011    Procedure: LEFT AND RIGHT HEART CATHETERIZATION WITH CORONARY ANGIOGRAM;  Surgeon: Dolores Patty, MD;  Location: [facility] CATH LAB;  Service: Cardiovascular;  Laterality: N/A;  . Right  heart catheterization N/A 02/22/2013    Procedure: RIGHT HEART CATH;  Surgeon: Dolores Patty, MD;  Location: [facility] CATH LAB;  Service: Cardiovascular;  Laterality: N/A;  . Right heart catheterization N/A 05/03/2013    Procedure: RIGHT HEART CATH;  Surgeon: Dolores Patty, MD;  Location: [facility] CATH LAB;  Service: Cardiovascular;  Laterality: N/A;  . Implantable cardioverter defibrillator implant N/A 08/23/2013    Procedure: IMPLANTABLE CARDIOVERTER DEFIBRILLATOR IMPLANT;  Surgeon: Duke Salvia, MD;  Location: [facility] CATH LAB;  Service: Cardiovascular;  Laterality: N/A;    SOCHx:  reports that he has been smoking Cigarettes.  He has a .96 pack-year smoking history. He has never used smokeless tobacco. He reports that he drinks alcohol. He reports that he uses illicit drugs (Cocaine).  FAMHx: Family History  Problem Relation Age of Onset  . Coronary artery disease Mother 30    s/p PCI  . Lung cancer Father   . Diabetes type II Maternal Uncle   . Coronary artery disease Maternal Uncle   . Stroke Neg Hx   . Heart attack Neg Hx     ALLERGIES: Allergies  Allergen Reactions  . Penicillins Other (See Comments)    Unknown childhood reaction     ROS: Pertinent items are noted in HPI. he has had some loose  stools and says his urine is "green".   HOME MEDICATIONS: Prior to Admission medications   Medication Sig Start Date End Date Taking? Authorizing Provider  amiodarone (PACERONE) 200 MG tablet TAKE 1 TABLET BY MOUTH DAILY 05/06/14  Yes Dolores Patty, MD  carvedilol (COREG) 12.5 MG tablet Take 1 tablet (12.5 mg total) by mouth 2 (two) times daily with a meal. 12/04/14  Yes Dolores Patty, MD  hydrALAZINE (APRESOLINE) 25 MG tablet Take 1 tablet (25 mg total) by mouth 3 (three) times daily. 01/29/14  Yes Aundria Rud, NP  isosorbide mononitrate (IMDUR) 30 MG 24 hr tablet Take 1 tablet (30 mg total) by mouth daily. 04/10/14  Yes Bevelyn Buckles Bensimhon, MD  lisinopril (PRINIVIL,ZESTRIL) 5  MG tablet Take 1 tablet (5 mg total) by mouth 2 (two) times daily. 12/04/14  Yes Bevelyn Buckles Bensimhon, MD  potassium chloride SA (K-DUR,KLOR-CON) 20 MEQ tablet Take 1 tablet (20 mEq total) by mouth daily. 08/26/14  Yes Laurey Morale, MD  spironolactone (ALDACTONE) 25 MG tablet Take 0.5 tablets (12.5 mg total) by mouth daily. 08/26/14  Yes Laurey Morale, MD  torsemide (DEMADEX) 20 MG tablet Take 3 tablets (60 mg total) by mouth 2 (two) times daily. 12/04/14  Yes Dolores Patty, MD  acetaminophen-codeine (TYLENOL #3) 300-30 MG per tablet Take 1-2 tablets by mouth every 6 (six) hours as needed for moderate pain. Patient not taking: Reported on 01/29/2015 09/23/14   Arby Barrette, MD  clotrimazole-betamethasone (LOTRISONE) cream Apply 1 application topically 2 (two) times daily. Patient not taking: Reported on 01/29/2015 09/23/14   Arby Barrette, MD  metFORMIN (GLUCOPHAGE) 500 MG tablet Take 1 tablet (500 mg total) by mouth 2 (two) times daily with a meal. Patient not taking: Reported on 01/29/2015 12/10/14 12/10/15  [name], MD  metolazone (ZAROXOLYN) 2.5 MG tablet Take 1 tablet (2.5 mg total) by mouth as needed. Patient taking differently: Take 2.5 mg by mouth as needed (fluid).  08/17/13   Amy Georgie Chard, NP    HOSPITAL MEDICATIONS: I have reviewed the patient's current medications.  VITALS: Blood pressure 127/70, pulse 67, temperature 98.2 F (36.8 C), temperature source Oral, resp. rate 12, height 6\' 4"  (1.93 m), weight 338 lb (153.316 kg), SpO2 94 %.  PHYSICAL EXAM: General appearance: alert, cooperative, no distress and morbidly obese Neck: no carotid bruit and no JVD Lungs: clear to auscultation bilaterally Heart: regular rate and rhythm Abdomen: obese Extremities: no edema Pulses: 2+ and symmetric Skin: Skin color, texture, turgor normal. No rashes or lesions Neurologic: Grossly normal  LABS: Results for orders placed or performed during the hospital encounter of 01/29/15  (from the past 24 hour(s))  CBC with Differential/Platelet     Status: Abnormal   Collection Time: 01/29/15 10:20 AM  Result Value Ref Range   WBC 7.9 4.0 - 10.5 K/uL   RBC 4.92 4.22 - 5.81 MIL/uL   Hemoglobin 14.9 13.0 - 17.0 g/dL   HCT 01/31/15 44.0 - 10.2 %   MCV 88.4 78.0 - 100.0 fL   MCH 30.3 26.0 - 34.0 pg   MCHC 34.3 30.0 - 36.0 g/dL   RDW 72.5 36.6 - 44.0 %   Platelets 208 150 - 400 K/uL   Neutrophils Relative % 47 43 - 77 %   Neutro Abs 3.7 1.7 - 7.7 K/uL   Lymphocytes Relative 33 12 - 46 %   Lymphs Abs 2.6 0.7 - 4.0 K/uL   Monocytes Relative 17 (H) 3 - 12 %  Monocytes Absolute 1.3 (H) 0.1 - 1.0 K/uL   Eosinophils Relative 3 0 - 5 %   Eosinophils Absolute 0.3 0.0 - 0.7 K/uL   Basophils Relative 0 0 - 1 %   Basophils Absolute 0.0 0.0 - 0.1 K/uL  Comprehensive metabolic panel     Status: Abnormal   Collection Time: 01/29/15 10:20 AM  Result Value Ref Range   Sodium 135 135 - 145 mmol/L   Potassium 3.5 3.5 - 5.1 mmol/L   Chloride 98 96 - 112 mmol/L   CO2 28 19 - 32 mmol/L   Glucose, Bld 190 (H) 70 - 99 mg/dL   BUN 12 6 - 23 mg/dL   Creatinine, Ser 2.11 0.50 - 1.35 mg/dL   Calcium 8.9 8.4 - 94.1 mg/dL   Total Protein 7.5 6.0 - 8.3 g/dL   Albumin 3.7 3.5 - 5.2 g/dL   AST 43 (H) 0 - 37 U/L   ALT 54 (H) 0 - 53 U/L   Alkaline Phosphatase 100 39 - 117 U/L   Total Bilirubin 0.6 0.3 - 1.2 mg/dL   GFR calc non Af Amer 69 (L) >90 mL/min   GFR calc Af Amer 80 (L) >90 mL/min   Anion gap 9 5 - 15  Lipase, blood     Status: None   Collection Time: 01/29/15 10:20 AM  Result Value Ref Range   Lipase 36 11 - 59 U/L  Troponin I     Status: Abnormal   Collection Time: 01/29/15 10:20 AM  Result Value Ref Range   Troponin I 0.20 (H) <0.031 ng/mL  Brain natriuretic peptide     Status: None   Collection Time: 01/29/15 10:20 AM  Result Value Ref Range   B Natriuretic Peptide 37.6 0.0 - 100.0 pg/mL  Urinalysis, Routine w reflex microscopic     Status: None   Collection Time:  01/29/15 12:14 PM  Result Value Ref Range   Color, Urine YELLOW YELLOW   APPearance CLEAR CLEAR   Specific Gravity, Urine 1.008 1.005 - 1.030   pH 6.0 5.0 - 8.0   Glucose, UA NEGATIVE NEGATIVE mg/dL   Hgb urine dipstick NEGATIVE NEGATIVE   Bilirubin Urine NEGATIVE NEGATIVE   Ketones, ur NEGATIVE NEGATIVE mg/dL   Protein, ur NEGATIVE NEGATIVE mg/dL   Urobilinogen, UA 0.2 0.0 - 1.0 mg/dL   Nitrite NEGATIVE NEGATIVE   Leukocytes, UA NEGATIVE NEGATIVE  Urine rapid drug screen (hosp performed)     Status: Abnormal   Collection Time: 01/29/15 12:14 PM  Result Value Ref Range   Opiates NONE DETECTED NONE DETECTED   Cocaine POSITIVE (A) NONE DETECTED   Benzodiazepines NONE DETECTED NONE DETECTED   Amphetamines NONE DETECTED NONE DETECTED   Tetrahydrocannabinol NONE DETECTED NONE DETECTED   Barbiturates NONE DETECTED NONE DETECTED    EKG: NSR, QTC 489  IMAGING: Dg Chest 2 View  01/29/2015   CLINICAL DATA:  Sternal chest pain for 3 days.  EXAM: CHEST  2 VIEW  COMPARISON:  November 30, 2014.  FINDINGS: The heart size and mediastinal contours are within normal limits. Both lungs are clear. No pneumothorax or pleural effusion is noted. Left-sided defibrillator is unchanged in position. The visualized skeletal structures are unremarkable.  IMPRESSION: No active cardiopulmonary disease.   Electronically Signed   By: Lupita Raider, M.D.   On: 01/29/2015 12:50    Ct Cervical Spine and Head Wo Contrast  01/29/2015   CLINICAL DATA:  39 year old male with syncopal episode, dizziness, fall. Head and  neck pain. Initial encounter.  EXAM: CT HEAD WITHOUT CONTRAST  CT CERVICAL SPINE WITHOUT CONTRAST    IMPRESSION: 1. Stable and normal noncontrast CT appearance of the brain. 2. No acute fracture or listhesis identified in the cervical spine. Ligamentous injury is not excluded.   Electronically Signed   By: Odessa Fleming M.D.   On: 01/29/2015 11:01    IMPRESSION: Principal Problem:   Syncope Active  Problems:   NICM- last EF 35-40%, June 2015   Cocaine use   Elevated troponin   Diabetes mellitus with stage 2 chronic kidney disease   Essential hypertension   Chronic combined systolic and diastolic congestive heart failure   ICD (MDT) in place   Normal coronary arteries-2006, 2012   Dyslipidemia   Epigastric pain   Neck pain, bilateral   Obesity-BMI 41-negative sleep study in the past   RECOMMENDATION: MD to see. Syncope and elevated Troponin in a pt with NICM and cocaine use. He has normal coronaries, his ICD has not fired. Elevated troponin could be from coronary spasm from cocaine. Consider PE as well.   Time Spent Directly with Patient: 45 minutes  Abelino Derrick [phone] beeper 01/29/2015, 2:43 PM  The patient's defibrillator was interrogated and did not go off. It was noted to be programmed as a single zone device, SVT discriminators were turned on her programmed to become inactive after 45 seconds, and ATP limit of 250 ms was noted. These issues were all addressed.  We also discussed the importance of cocaine avoidance.  Physical exam is as noted above.  Functionally he has been quite stable. He is under a great deal of psychosocial stress.

## 2015-01-29 NOTE — H&P (Signed)
Date: 01/29/2015               Patient Name:  Douglas [name] MRN: [id number]  DOB: 1976/08/31 Age / Sex: 39 y.o., male   PCP: [name], MD         Medical Service: Internal Medicine Teaching Service         Attending Physician: Dr. Gilda Crease, MD    First Contact: Dr. Loma Newton Pager: [phone]  Second Contact: Dr. Mikey Bussing Pager: [phone]       After Hours (After 5p/  First Contact Pager: [phone]  weekends / holidays): Second Contact Pager: [phone]   Chief Complaint: Chest Pain and Loss of Consciousness  History of Present Illness:   Patient is a 39 year old with morbid obesity, hypertension, hyperlipidemia, chronic kidney disease stage II, nonischemic cardiomyopathy with ICD placement who presents with chest pain as well as syncope.  Patient states that he has been having hurting in his head, neck, chest, and belly in the last couple days. He states that he has been dizzy for about three days. He states that he has a slight sensation that the room is spinning around him. He states that laying down improve his symptoms and states that it gets worse with sitting and standing.   Patient reports that he had a suspected episode of passing out after getting out of the shower. He states that he woke up in a chair and did not remember the events that led to him being in that chair. No one was around to witness his passing out. He does not remember experiencing any shocks from his device. He denies having a fall or any trauma secondary to the event. He also denies any shaking movements or associated bowel or bladder dysfunction around the episode.   Patient also reports some chest pain that was associated with diffuse pain throughout his body including his head, neck, and abdominal pain. He states that it was parasternal and a pressure sensation. He has not taking any medications to help with his pain. He reports some associated diaphoresis with this episode. He denies any  associated nausea.   Patient reports some green colored diarrhea yesterday for one time. He reports some nausea with clear vomitus. He otherwise denies any fevers, dyspnea, cough. Patient states that he smokes around 3-4 cigarettes a day. He denies any alcohol use in the last 3-4 months. He states that he used cocaine 1 week ago. He states that he is married and has four kids. Patient has a implanted AICD which patient states was placed because his heart was beating too fast.   Meds:  (Not in a hospital admission) No current facility-administered medications for this encounter.   Current Outpatient Prescriptions  Medication Sig Dispense Refill  . amiodarone (PACERONE) 200 MG tablet TAKE 1 TABLET BY MOUTH DAILY 30 tablet 2  . carvedilol (COREG) 12.5 MG tablet Take 1 tablet (12.5 mg total) by mouth 2 (two) times daily with a meal. 60 tablet 3  . hydrALAZINE (APRESOLINE) 25 MG tablet Take 1 tablet (25 mg total) by mouth 3 (three) times daily. 90 tablet 1  . isosorbide mononitrate (IMDUR) 30 MG 24 hr tablet Take 1 tablet (30 mg total) by mouth daily. 30 tablet 3  . lisinopril (PRINIVIL,ZESTRIL) 5 MG tablet Take 1 tablet (5 mg total) by mouth 2 (two) times daily. 60 tablet 3  . potassium chloride SA (K-DUR,KLOR-CON) 20 MEQ tablet Take 1 tablet (20 mEq total) by mouth daily. 30 tablet  3  . spironolactone (ALDACTONE) 25 MG tablet Take 0.5 tablets (12.5 mg total) by mouth daily. 15 tablet 3  . torsemide (DEMADEX) 20 MG tablet Take 3 tablets (60 mg total) by mouth 2 (two) times daily. 180 tablet 3  . acetaminophen-codeine (TYLENOL #3) 300-30 MG per tablet Take 1-2 tablets by mouth every 6 (six) hours as needed for moderate pain. (Patient not taking: Reported on 01/29/2015) 15 tablet 0  . clotrimazole-betamethasone (LOTRISONE) cream Apply 1 application topically 2 (two) times daily. (Patient not taking: Reported on 01/29/2015) 45 g 0  . metFORMIN (GLUCOPHAGE) 500 MG tablet Take 1 tablet (500 mg total) by  mouth 2 (two) times daily with a meal. (Patient not taking: Reported on 01/29/2015) 60 tablet 3  . metolazone (ZAROXOLYN) 2.5 MG tablet Take 1 tablet (2.5 mg total) by mouth as needed. (Patient taking differently: Take 2.5 mg by mouth as needed (fluid). ) 8 tablet 3    Past Medical History  Diagnosis Date  . Hypertension   . Systolic CHF, chronic   . Obesity   . Nonischemic cardiomyopathy     a.  echo 4/06: EF 30%, mild to mod MR, mild RAE, inf HK, lat HK , ant HK;    b.  cath 4/06: no CAD, EF 20-25%  . ED (erectile dysfunction)   . NSVT (nonsustained ventricular tachycardia)     eval by EP in past; no ICD candidate due to NYHA 1 symptoms  . Automatic implantable cardioverter-defibrillator in situ   . Exertional shortness of breath     Past Surgical History  Procedure Laterality Date  . Multiple extractions with alveoloplasty N/A 01/26/2013    Procedure:  EXTRACION  TOOTH # 19 WITH ALVEOLOPLASTY;  Surgeon: Charlynne Pander, DDS;  Location: [facility] OR;  Service: Oral Surgery;  Laterality: N/A;  . Cardiac defibrillator placement  08/23/2013  . Cardiac catheterization  09/2011; 02/2013; 04/2013  . Left and right heart catheterization with coronary angiogram N/A 09/20/2011    Procedure: LEFT AND RIGHT HEART CATHETERIZATION WITH CORONARY ANGIOGRAM;  Surgeon: Dolores Patty, MD;  Location: [facility] CATH LAB;  Service: Cardiovascular;  Laterality: N/A;  . Right heart catheterization N/A 02/22/2013    Procedure: RIGHT HEART CATH;  Surgeon: Dolores Patty, MD;  Location: [facility] CATH LAB;  Service: Cardiovascular;  Laterality: N/A;  . Right heart catheterization N/A 05/03/2013    Procedure: RIGHT HEART CATH;  Surgeon: Dolores Patty, MD;  Location: [facility] CATH LAB;  Service: Cardiovascular;  Laterality: N/A;  . Implantable cardioverter defibrillator implant N/A 08/23/2013    Procedure: IMPLANTABLE CARDIOVERTER DEFIBRILLATOR IMPLANT;  Surgeon: Duke Salvia, MD;  Location: [facility] CATH LAB;  Service:  Cardiovascular;  Laterality: N/A;     Allergies: Allergies as of 01/29/2015 - Review Complete 01/29/2015  Allergen Reaction Noted  . Penicillins Other (See Comments) 03/03/2011    Family History  Problem Relation Age of Onset  . Coronary artery disease Mother 24    s/p PCI  . Lung cancer Father   . Diabetes type II Maternal Uncle   . Coronary artery disease Maternal Uncle   . Stroke Neg Hx   . Heart attack Neg Hx     History   Social History  . Marital Status: Legally Separated    Spouse Name: N/A  . Number of Children: 5  . Years of Education: N/A   Occupational History  . unemployed    Social History Main Topics  . Smoking status: Current Every Day Smoker --  0.12 packs/day for 8 years    Types: Cigarettes  . Smokeless tobacco: Never Used  . Alcohol Use: 0.0 oz/week    0 Standard drinks or equivalent per week     Comment: 08/23/2013  "rarely have a beer anymore; last beer was a long time ago"  . Drug Use: Yes    Special: Cocaine     Comment: 11/30/14 today,  used cocaine one week ago  . Sexual Activity: Not Currently   Other Topics Concern  . Not on file   Social History Narrative    Review of Systems: All pertinent ROS as stated in HPI.   Physical Exam: Blood pressure 127/70, pulse 67, temperature 98.2 F (36.8 C), temperature source Oral, resp. rate 12, height 6\' 4"  (1.93 m), weight 338 lb (153.316 kg), SpO2 94 %. General: resting in bed, obese HEENT: PERRL, EOMI, no scleral icterus Cardiac: RRR, no rubs, murmurs or gallops Pulm: clear to auscultation bilaterally, moving normal volumes of air Abd: soft, nontender, nondistended, BS present Ext: warm and well perfused, no pedal edema Neuro: alert and oriented X3, cranial nerves II-XII grossly intact Skin: no rashes or lesions noted Psych: appropriate affect and cognition  Lab results: Basic Metabolic Panel:  Recent Labs  1020  NA 135  K 3.5  CL 98  CO2 28  GLUCOSE 190*  BUN 12    CREATININE 1.27  CALCIUM 8.9   Liver Function Tests:  Recent Labs  01/29/15 1020  AST 43*  ALT 54*  ALKPHOS 100  BILITOT 0.6  PROT 7.5  ALBUMIN 3.7    Recent Labs  01/29/15 1020  LIPASE 36   No results for input(s): AMMONIA in the last 72 hours. CBC:  Recent Labs  01/29/15 1020  WBC 7.9  NEUTROABS 3.7  HGB 14.9  HCT 43.5  MCV 88.4  PLT 208   Cardiac Enzymes:  Recent Labs  01/29/15 1020  TROPONINI 0.20*   BNP: No results for input(s): PROBNP in the last 72 hours. D-Dimer: No results for input(s): DDIMER in the last 72 hours. CBG: No results for input(s): GLUCAP in the last 72 hours. Hemoglobin A1C: No results for input(s): HGBA1C in the last 72 hours. Fasting Lipid Panel: No results for input(s): CHOL, HDL, LDLCALC, TRIG, CHOLHDL, LDLDIRECT in the last 72 hours. Thyroid Function Tests: No results for input(s): TSH, T4TOTAL, FREET4, T3FREE, THYROIDAB in the last 72 hours. Anemia Panel: No results for input(s): VITAMINB12, FOLATE, FERRITIN, TIBC, IRON, RETICCTPCT in the last 72 hours. Coagulation: No results for input(s): LABPROT, INR in the last 72 hours. Urine Drug Screen: Drugs of Abuse     Component Value Date/Time   LABOPIA NONE DETECTED 01/29/2015 1214   COCAINSCRNUR POSITIVE* 01/29/2015 1214   LABBENZ NONE DETECTED 01/29/2015 1214   AMPHETMU NONE DETECTED 01/29/2015 1214   THCU NONE DETECTED 01/29/2015 1214   LABBARB NONE DETECTED 01/29/2015 1214    Alcohol Level: No results for input(s): ETH in the last 72 hours. Urinalysis:  Recent Labs  01/29/15 1214  COLORURINE YELLOW  LABSPEC 1.008  PHURINE 6.0  GLUCOSEU NEGATIVE  HGBUR NEGATIVE  BILIRUBINUR NEGATIVE  KETONESUR NEGATIVE  PROTEINUR NEGATIVE  UROBILINOGEN 0.2  NITRITE NEGATIVE  LEUKOCYTESUR NEGATIVE   Imaging results:  Dg Chest 2 View  01/29/2015   CLINICAL DATA:  Sternal chest pain for 3 days.  EXAM: CHEST  2 VIEW  COMPARISON:  November 30, 2014.  FINDINGS: The heart  size and mediastinal contours are within normal limits. Both  lungs are clear. No pneumothorax or pleural effusion is noted. Left-sided defibrillator is unchanged in position. The visualized skeletal structures are unremarkable.  IMPRESSION: No active cardiopulmonary disease.   Electronically Signed   By: Lupita Raider, M.D.   On: 01/29/2015 12:50   Ct Head Wo Contrast  01/29/2015   CLINICAL DATA:  39 year old male with syncopal episode, dizziness, fall. Head and neck pain. Initial encounter.  EXAM: CT HEAD WITHOUT CONTRAST  CT CERVICAL SPINE WITHOUT CONTRAST  TECHNIQUE: Multidetector CT imaging of the head and cervical spine was performed following the standard protocol without intravenous contrast. Multiplanar CT image reconstructions of the cervical spine were also generated.  COMPARISON:  10/13/2013.  FINDINGS: CT HEAD FINDINGS  Left chest cardiac AICD on the scout view.  Visualized orbit soft tissues are within normal limits. No scalp hematoma identified. Stable paranasal sinuses and mastoids. No calvarium fracture identified.  Cerebral volume remains normal. No midline shift, ventriculomegaly, mass effect, evidence of mass lesion, intracranial hemorrhage or evidence of cortically based acute infarction. Gray-white matter differentiation is within normal limits throughout the brain. No suspicious intracranial vascular hyperdensity.  CT CERVICAL SPINE FINDINGS  Chronic straightening of cervical lordosis. Visualized skull base is intact. No atlanto-occipital dissociation. Cervicothoracic junction alignment is within normal limits. Bilateral posterior element alignment is within normal limits. No acute cervical spine fracture identified. Grossly intact visualized upper thoracic levels.  Stable lung apices.  Negative noncontrast paraspinal soft tissues.  IMPRESSION: 1. Stable and normal noncontrast CT appearance of the brain. 2. No acute fracture or listhesis identified in the cervical spine. Ligamentous injury  is not excluded.   Electronically Signed   By: Odessa Fleming M.D.   On: 01/29/2015 11:01   Ct Cervical Spine Wo Contrast  01/29/2015   CLINICAL DATA:  39 year old male with syncopal episode, dizziness, fall. Head and neck pain. Initial encounter.  EXAM: CT HEAD WITHOUT CONTRAST  CT CERVICAL SPINE WITHOUT CONTRAST  TECHNIQUE: Multidetector CT imaging of the head and cervical spine was performed following the standard protocol without intravenous contrast. Multiplanar CT image reconstructions of the cervical spine were also generated.  COMPARISON:  10/13/2013.  FINDINGS: CT HEAD FINDINGS  Left chest cardiac AICD on the scout view.  Visualized orbit soft tissues are within normal limits. No scalp hematoma identified. Stable paranasal sinuses and mastoids. No calvarium fracture identified.  Cerebral volume remains normal. No midline shift, ventriculomegaly, mass effect, evidence of mass lesion, intracranial hemorrhage or evidence of cortically based acute infarction. Gray-white matter differentiation is within normal limits throughout the brain. No suspicious intracranial vascular hyperdensity.  CT CERVICAL SPINE FINDINGS  Chronic straightening of cervical lordosis. Visualized skull base is intact. No atlanto-occipital dissociation. Cervicothoracic junction alignment is within normal limits. Bilateral posterior element alignment is within normal limits. No acute cervical spine fracture identified. Grossly intact visualized upper thoracic levels.  Stable lung apices.  Negative noncontrast paraspinal soft tissues.  IMPRESSION: 1. Stable and normal noncontrast CT appearance of the brain. 2. No acute fracture or listhesis identified in the cervical spine. Ligamentous injury is not excluded.   Electronically Signed   By: Odessa Fleming M.D.   On: 01/29/2015 11:01    Other results: EKG Interpretation  Date/Time:  Wednesday January 29 2015 09:35:58 EDT Ventricular Rate:  86 PR Interval:  172 QRS Duration: 107 QT  Interval:  409 QTC Calculation: 489 R Axis:   33 Text Interpretation:  Sinus rhythm Left atrial enlargement Borderline prolonged QT interval Nonspecific ST and T wave  abnormality No significant change since last tracing Confirmed by [facility]  MD, CHRISTOPHER [phone]) on 01/29/2015 9:40:46 AM   Assessment & Plan by Problem: Principal Problem:   Syncope Active Problems:   Diabetes mellitus with stage 2 chronic kidney disease   Dyslipidemia   Essential hypertension   NICM- last EF 35-40%, June 2015   Chronic combined systolic and diastolic congestive heart failure   Cocaine use   ICD (MDT) in place   Epigastric pain   Neck pain, bilateral   Normal coronary arteries-2006, 2012   Elevated troponin   Obesity-BMI 41-negative sleep study in the past  Patient is a 39 year old with morbid obesity, type 2 diabetes, hypertension, hyperlipidemia, chronic kidney disease stage II, nonischemic cardiomyopathy with ICD placement who presents with chest pain and syncope of possible cardiac etiology.  Syncope associated with chest, abdominal, hip pain: Patient reports a single episode of syncope associated with diffuse pain throughout his chest including his abdomen and head. Patient does not remember experiencing any shocks from his ICD. Patient with a previous admission for syncope secondary to nonsustained ventricular fibrillation as recorded on his ICD. This was in the setting of cocaine use and sexual activity. Troponin slightly elevated at 0.20, though down from previous of 0.3 from 2 months ago. CT head with no acute intracranial abnormalities. D-dimer negative at 0.4. Likely secondary to cocaine abuse and vasospasm. -Orthostatic vitals -Interrogate ICD -Cardiology consulted. -Continue trending troponins  Nonischemic cardiomyopathy with systolic heart failure: Patient is status post ICD placement patient with an echocardiogram from June 2015 showing ejection fraction of 35-40% with diffuse hypokinesis  and grade 1 diastolic dysfunction. Patient does not currently exhibit any signs of fluid overload. BNP within normal limits. Patient is currently on Coreg 12.5 mg twice a day, hydralazine 25 mg 3 times a day, Imdur 30 mg daily, lisinopril 5 mg twice a day, spironolactone 12.5 mg daily, torsemide 60 mg twice a day, and metolazone 2.5 mg when necessary for fluid overload. -Interrogate ICD -Continue to trend troponins -Check magnesium level in the morning. -Continue home medications except for metolazone.  Hypertension: Patient's home regimen as listed above. Patient's blood pressures have been within the normal range since initial evaluation. -As indicated above for nonischemic cardiomyopathy.  Type 2 diabetes: Patient with a recent hemoglobin A1c of 7.5. Patient's blood glucose levels have been elevated to 190s since admission. Patient is on metformin 500 mg twice a day. -Moderate sliding scale insulin with nighttime coverage -Hold metformin as inpatient.  Stage II chronic kidney disease: Patient's creatinine at 1.27 which is close to previously exhibited baseline. No current evidence of acute kidney injury.  Polysubstance abuse: Patient tests positive for cocaine consistently. Patient states that he last used cocaine one week ago. -Counsel patient on importance of cocaine cessation.  Diet: Heart healthy diet Prophylaxis: Lovenox 40 mg daily Code: Full code  Dispo: Disposition is deferred at this time, awaiting improvement of current medical problems. Anticipated discharge in approximately 2 day(s).   The patient does have a current PCP ([name], MD) and does need an [facility] hospital follow-up appointment after discharge.  The patient does not have transportation limitations that hinder transportation to clinic appointments.  Signed: Harold Barban, M.D., Ph.D. Internal Medicine Teaching Service, PGY-1 01/29/2015, 2:53 PM

## 2015-01-29 NOTE — ED Provider Notes (Signed)
CSN: [id number]     Arrival date & time 01/29/15  8372 History   First MD Initiated Contact with Patient 01/29/15 [phone]     Chief Complaint  Patient presents with  . Chest Pain  . Loss of Consciousness     (Consider location/radiation/quality/duration/timing/severity/associated sxs/prior Treatment) HPI Comments: Patient presents to the ER for evaluation after syncope. Patient reports that he has been having headache, neck pain, chest pain, abdominal pain for the last 3 days. He had a syncopal episode this morning. He reports that he was sitting in a chair, did not fall. At arrival to the ER, patient continues to complain of chest pain. Chest pain is moderate and continuous. Pain in his head and neck is worse, he reports worsening and shooting pains when he moves his head.  Patient is a 39 y.o. male presenting with chest pain and syncope.  Chest Pain Associated symptoms: abdominal pain, back pain, dizziness, headache and syncope   Loss of Consciousness Associated symptoms: chest pain, dizziness and headaches     Past Medical History  Diagnosis Date  . Hypertension   . Systolic CHF, chronic   . Obesity   . Nonischemic cardiomyopathy     a.  echo 4/06: EF 30%, mild to mod MR, mild RAE, inf HK, lat HK , ant HK;    b.  cath 4/06: no CAD, EF 20-25%  . ED (erectile dysfunction)   . NSVT (nonsustained ventricular tachycardia)     eval by EP in past; no ICD candidate due to NYHA 1 symptoms  . Automatic implantable cardioverter-defibrillator in situ   . Exertional shortness of breath    Past Surgical History  Procedure Laterality Date  . Multiple extractions with alveoloplasty N/A 01/26/2013    Procedure:  EXTRACION  TOOTH # 19 WITH ALVEOLOPLASTY;  Surgeon: Charlynne Pander, DDS;  Location: [facility] OR;  Service: Oral Surgery;  Laterality: N/A;  . Cardiac defibrillator placement  08/23/2013  . Cardiac catheterization  09/2011; 02/2013; 04/2013  . Left and right heart catheterization with coronary  angiogram N/A 09/20/2011    Procedure: LEFT AND RIGHT HEART CATHETERIZATION WITH CORONARY ANGIOGRAM;  Surgeon: Dolores Patty, MD;  Location: [facility] CATH LAB;  Service: Cardiovascular;  Laterality: N/A;  . Right heart catheterization N/A 02/22/2013    Procedure: RIGHT HEART CATH;  Surgeon: Dolores Patty, MD;  Location: [facility] CATH LAB;  Service: Cardiovascular;  Laterality: N/A;  . Right heart catheterization N/A 05/03/2013    Procedure: RIGHT HEART CATH;  Surgeon: Dolores Patty, MD;  Location: [facility] CATH LAB;  Service: Cardiovascular;  Laterality: N/A;  . Implantable cardioverter defibrillator implant N/A 08/23/2013    Procedure: IMPLANTABLE CARDIOVERTER DEFIBRILLATOR IMPLANT;  Surgeon: Duke Salvia, MD;  Location: [facility] CATH LAB;  Service: Cardiovascular;  Laterality: N/A;   Family History  Problem Relation Age of Onset  . Coronary artery disease Mother 53    s/p PCI  . Lung cancer Father   . Diabetes type II Maternal Uncle   . Coronary artery disease Maternal Uncle   . Stroke Neg Hx   . Heart attack Neg Hx    History  Substance Use Topics  . Smoking status: Current Every Day Smoker -- 0.12 packs/day for 8 years    Types: Cigarettes  . Smokeless tobacco: Never Used  . Alcohol Use: 0.0 oz/week    0 Standard drinks or equivalent per week     Comment: 08/23/2013  "rarely have a beer anymore; last beer was a  long time ago"    Review of Systems  Cardiovascular: Positive for chest pain and syncope.  Gastrointestinal: Positive for abdominal pain.  Musculoskeletal: Positive for back pain.  Neurological: Positive for dizziness, syncope and headaches.  All other systems reviewed and are negative.     Allergies  Penicillins  Home Medications   Prior to Admission medications   Medication Sig Start Date End Date Taking? Authorizing Provider  amiodarone (PACERONE) 200 MG tablet TAKE 1 TABLET BY MOUTH DAILY 05/06/14  Yes Dolores Patty, MD  carvedilol (COREG) 12.5 MG tablet Take 1  tablet (12.5 mg total) by mouth 2 (two) times daily with a meal. 12/04/14  Yes Dolores Patty, MD  hydrALAZINE (APRESOLINE) 25 MG tablet Take 1 tablet (25 mg total) by mouth 3 (three) times daily. 01/29/14  Yes Aundria Rud, NP  isosorbide mononitrate (IMDUR) 30 MG 24 hr tablet Take 1 tablet (30 mg total) by mouth daily. 04/10/14  Yes Bevelyn Buckles Bensimhon, MD  lisinopril (PRINIVIL,ZESTRIL) 5 MG tablet Take 1 tablet (5 mg total) by mouth 2 (two) times daily. 12/04/14  Yes Bevelyn Buckles Bensimhon, MD  potassium chloride SA (K-DUR,KLOR-CON) 20 MEQ tablet Take 1 tablet (20 mEq total) by mouth daily. 08/26/14  Yes Laurey Morale, MD  spironolactone (ALDACTONE) 25 MG tablet Take 0.5 tablets (12.5 mg total) by mouth daily. 08/26/14  Yes Laurey Morale, MD  torsemide (DEMADEX) 20 MG tablet Take 3 tablets (60 mg total) by mouth 2 (two) times daily. 12/04/14  Yes Dolores Patty, MD  acetaminophen-codeine (TYLENOL #3) 300-30 MG per tablet Take 1-2 tablets by mouth every 6 (six) hours as needed for moderate pain. Patient not taking: Reported on 01/29/2015 09/23/14   Arby Barrette, MD  clotrimazole-betamethasone (LOTRISONE) cream Apply 1 application topically 2 (two) times daily. Patient not taking: Reported on 01/29/2015 09/23/14   Arby Barrette, MD  metFORMIN (GLUCOPHAGE) 500 MG tablet Take 1 tablet (500 mg total) by mouth 2 (two) times daily with a meal. Patient not taking: Reported on 01/29/2015 12/10/14 12/10/15  [name], MD  metolazone (ZAROXOLYN) 2.5 MG tablet Take 1 tablet (2.5 mg total) by mouth as needed. Patient taking differently: Take 2.5 mg by mouth as needed (fluid).  08/17/13   Amy D Clegg, NP   BP 125/70 mmHg  Pulse 74  Temp(Src) 98.2 F (36.8 C) (Oral)  Resp 14  Ht 6\' 4"  (1.93 m)  Wt 338 lb (153.316 kg)  BMI 41.16 kg/m2  SpO2 94% Physical Exam  Constitutional: He is oriented to person, place, and time. He appears well-developed and well-nourished. No distress.  HENT:  Head:  Normocephalic and atraumatic.  Right Ear: Hearing normal.  Left Ear: Hearing normal.  Nose: Nose normal.  Mouth/Throat: Oropharynx is clear and moist and mucous membranes are normal.  Eyes: Conjunctivae and EOM are normal. Pupils are equal, round, and reactive to light.  Neck: Normal range of motion. Neck supple.  Cardiovascular: Regular rhythm, S1 normal and S2 normal.  Exam reveals no gallop and no friction rub.   No murmur heard. Pulmonary/Chest: Effort normal and breath sounds normal. No respiratory distress. He exhibits no tenderness.  Abdominal: Soft. Normal appearance and bowel sounds are normal. There is no hepatosplenomegaly. There is no tenderness. There is no rebound, no guarding, no tenderness at McBurney's point and negative Murphy's sign. No hernia.  Musculoskeletal: Normal range of motion.       Cervical back: He exhibits tenderness and pain.  Thoracic back: He exhibits tenderness and pain.       Back:  Neurological: He is alert and oriented to person, place, and time. He has normal strength. No cranial nerve deficit or sensory deficit. Coordination normal. GCS eye subscore is 4. GCS verbal subscore is 5. GCS motor subscore is 6.  Skin: Skin is warm, dry and intact. No rash noted. No cyanosis.  Psychiatric: He has a normal mood and affect. His speech is normal and behavior is normal. Thought content normal.  Nursing note and vitals reviewed.   ED Course  Procedures (including critical care time) Labs Review Labs Reviewed  CBC WITH DIFFERENTIAL/PLATELET - Abnormal; Notable for the following:    Monocytes Relative 17 (*)    Monocytes Absolute 1.3 (*)    All other components within normal limits  COMPREHENSIVE METABOLIC PANEL - Abnormal; Notable for the following:    Glucose, Bld 190 (*)    AST 43 (*)    ALT 54 (*)    GFR calc non Af Amer 69 (*)    GFR calc Af Amer 80 (*)    All other components within normal limits  TROPONIN I - Abnormal; Notable for the  following:    Troponin I 0.20 (*)    All other components within normal limits  LIPASE, BLOOD  URINALYSIS, ROUTINE W REFLEX MICROSCOPIC  BRAIN NATRIURETIC PEPTIDE  URINE RAPID DRUG SCREEN (HOSP PERFORMED)    Imaging Review Dg Chest 2 View  01/29/2015   CLINICAL DATA:  Sternal chest pain for 3 days.  EXAM: CHEST  2 VIEW  COMPARISON:  November 30, 2014.  FINDINGS: The heart size and mediastinal contours are within normal limits. Both lungs are clear. No pneumothorax or pleural effusion is noted. Left-sided defibrillator is unchanged in position. The visualized skeletal structures are unremarkable.  IMPRESSION: No active cardiopulmonary disease.   Electronically Signed   By: Lupita Raider, M.D.   On: 01/29/2015 12:50   Ct Head Wo Contrast  01/29/2015   CLINICAL DATA:  39 year old male with syncopal episode, dizziness, fall. Head and neck pain. Initial encounter.  EXAM: CT HEAD WITHOUT CONTRAST  CT CERVICAL SPINE WITHOUT CONTRAST  TECHNIQUE: Multidetector CT imaging of the head and cervical spine was performed following the standard protocol without intravenous contrast. Multiplanar CT image reconstructions of the cervical spine were also generated.  COMPARISON:  10/13/2013.  FINDINGS: CT HEAD FINDINGS  Left chest cardiac AICD on the scout view.  Visualized orbit soft tissues are within normal limits. No scalp hematoma identified. Stable paranasal sinuses and mastoids. No calvarium fracture identified.  Cerebral volume remains normal. No midline shift, ventriculomegaly, mass effect, evidence of mass lesion, intracranial hemorrhage or evidence of cortically based acute infarction. Gray-white matter differentiation is within normal limits throughout the brain. No suspicious intracranial vascular hyperdensity.  CT CERVICAL SPINE FINDINGS  Chronic straightening of cervical lordosis. Visualized skull base is intact. No atlanto-occipital dissociation. Cervicothoracic junction alignment is within normal limits.  Bilateral posterior element alignment is within normal limits. No acute cervical spine fracture identified. Grossly intact visualized upper thoracic levels.  Stable lung apices.  Negative noncontrast paraspinal soft tissues.  IMPRESSION: 1. Stable and normal noncontrast CT appearance of the brain. 2. No acute fracture or listhesis identified in the cervical spine. Ligamentous injury is not excluded.   Electronically Signed   By: Odessa Fleming M.D.   On: 01/29/2015 11:01   Ct Cervical Spine Wo Contrast  01/29/2015   CLINICAL DATA:  39 year old male  with syncopal episode, dizziness, fall. Head and neck pain. Initial encounter.  EXAM: CT HEAD WITHOUT CONTRAST  CT CERVICAL SPINE WITHOUT CONTRAST  TECHNIQUE: Multidetector CT imaging of the head and cervical spine was performed following the standard protocol without intravenous contrast. Multiplanar CT image reconstructions of the cervical spine were also generated.  COMPARISON:  10/13/2013.  FINDINGS: CT HEAD FINDINGS  Left chest cardiac AICD on the scout view.  Visualized orbit soft tissues are within normal limits. No scalp hematoma identified. Stable paranasal sinuses and mastoids. No calvarium fracture identified.  Cerebral volume remains normal. No midline shift, ventriculomegaly, mass effect, evidence of mass lesion, intracranial hemorrhage or evidence of cortically based acute infarction. Gray-white matter differentiation is within normal limits throughout the brain. No suspicious intracranial vascular hyperdensity.  CT CERVICAL SPINE FINDINGS  Chronic straightening of cervical lordosis. Visualized skull base is intact. No atlanto-occipital dissociation. Cervicothoracic junction alignment is within normal limits. Bilateral posterior element alignment is within normal limits. No acute cervical spine fracture identified. Grossly intact visualized upper thoracic levels.  Stable lung apices.  Negative noncontrast paraspinal soft tissues.  IMPRESSION: 1. Stable and  normal noncontrast CT appearance of the brain. 2. No acute fracture or listhesis identified in the cervical spine. Ligamentous injury is not excluded.   Electronically Signed   By: Odessa Fleming M.D.   On: 01/29/2015 11:01     EKG Interpretation   Date/Time:  Wednesday January 29 2015 09:35:58 EDT Ventricular Rate:  86 PR Interval:  172 QRS Duration: 107 QT Interval:  409 QTC Calculation: 489 R Axis:   33 Text Interpretation:  Sinus rhythm Left atrial enlargement Borderline  prolonged QT interval Nonspecific ST and T wave abnormality No significant  change since last tracing Confirmed by POLLINA  MD, CHRISTOPHER [phone]) on  01/29/2015 9:40:46 AM      MDM   Final diagnoses:  Syncope  Chest pain    Patient presents to the ER for evaluation of syncope. Patient blacked out earlier, does not remember exactly what happened. He presents to the ER with complaints of chest pain, back pain, neck pain and headache. He does, however, think that all of these symptoms were present before the fall. CT head and cervical spine were performed, no acute pathology was noted.  Patient has significant past medical history. He has systolic congestive heart failure secondary to nonischemic cardiomyopathy. Patient has had V. tach in the past, currently has an implanted defibrillator. Awaiting interrogation of defibrillator, do not know if this was V. tach that caused his syncope. Patient has elevated troponin without ischemic changes on his EKG. Will consult his cardiologist for further evaluation, will admit to internal medicine.    Gilda Crease, MD 01/29/15 [phone]

## 2015-01-29 NOTE — ED Notes (Signed)
Pt has left for CT

## 2015-01-29 NOTE — ED Notes (Signed)
Patient transported to CT 

## 2015-01-29 NOTE — ED Notes (Signed)
Pt stated that chest pain, neck pain, dizziness and syncope started 3 days ago, he thinks he blacked out getting out of the tub today, so he cam to ED to get evaluated.

## 2015-01-30 ENCOUNTER — Encounter: Payer: Self-pay | Admitting: Internal Medicine

## 2015-01-30 ENCOUNTER — Encounter (HOSPITAL_COMMUNITY): Payer: Self-pay | Admitting: General Practice

## 2015-01-30 DIAGNOSIS — I255 Ischemic cardiomyopathy: Secondary | ICD-10-CM | POA: Diagnosis not present

## 2015-01-30 DIAGNOSIS — R0789 Other chest pain: Secondary | ICD-10-CM | POA: Diagnosis not present

## 2015-01-30 DIAGNOSIS — R55 Syncope and collapse: Secondary | ICD-10-CM | POA: Diagnosis not present

## 2015-01-30 DIAGNOSIS — I129 Hypertensive chronic kidney disease with stage 1 through stage 4 chronic kidney disease, or unspecified chronic kidney disease: Secondary | ICD-10-CM | POA: Diagnosis not present

## 2015-01-30 DIAGNOSIS — R079 Chest pain, unspecified: Secondary | ICD-10-CM | POA: Insufficient documentation

## 2015-01-30 DIAGNOSIS — F17210 Nicotine dependence, cigarettes, uncomplicated: Secondary | ICD-10-CM | POA: Diagnosis not present

## 2015-01-30 DIAGNOSIS — E119 Type 2 diabetes mellitus without complications: Secondary | ICD-10-CM | POA: Diagnosis not present

## 2015-01-30 LAB — BASIC METABOLIC PANEL
ANION GAP: 13 (ref 5–15)
Anion gap: 11 (ref 5–15)
BUN: 17 mg/dL (ref 6–23)
BUN: 18 mg/dL (ref 6–23)
CALCIUM: 8.6 mg/dL (ref 8.4–10.5)
CHLORIDE: 95 mmol/L — AB (ref 96–112)
CHLORIDE: 96 mmol/L (ref 96–112)
CO2: 26 mmol/L (ref 19–32)
CO2: 28 mmol/L (ref 19–32)
Calcium: 8.9 mg/dL (ref 8.4–10.5)
Creatinine, Ser: 1.72 mg/dL — ABNORMAL HIGH (ref 0.50–1.35)
Creatinine, Ser: 1.79 mg/dL — ABNORMAL HIGH (ref 0.50–1.35)
GFR calc Af Amer: 53 mL/min — ABNORMAL LOW (ref >90)
GFR calc non Af Amer: 46 mL/min — ABNORMAL LOW (ref >90)
GFR, EST AFRICAN AMERICAN: 56 mL/min — AB (ref >90)
GFR, EST NON AFRICAN AMERICAN: 48 mL/min — AB (ref >90)
Glucose, Bld: 134 mg/dL — ABNORMAL HIGH (ref 70–99)
Glucose, Bld: 128 mg/dL — ABNORMAL HIGH (ref 70–99)
POTASSIUM: 4.2 mmol/L (ref 3.5–5.1)
POTASSIUM: 3.8 mmol/L (ref 3.5–5.1)
SODIUM: 134 mmol/L — AB (ref 135–145)
Sodium: 135 mmol/L (ref 135–145)

## 2015-01-30 LAB — MAGNESIUM: Magnesium: 1.8 mg/dL (ref 1.5–2.5)

## 2015-01-30 LAB — URINALYSIS, DIPSTICK ONLY
Bilirubin Urine: NEGATIVE
Glucose, UA: NEGATIVE mg/dL
Hgb urine dipstick: NEGATIVE
Ketones, ur: NEGATIVE mg/dL
LEUKOCYTES UA: NEGATIVE
NITRITE: NEGATIVE
Protein, ur: NEGATIVE mg/dL
Specific Gravity, Urine: 1.025 (ref 1.005–1.030)
Urobilinogen, UA: 0.2 mg/dL (ref 0.0–1.0)
pH: 5.5 (ref 5.0–8.0)

## 2015-01-30 LAB — TROPONIN I: TROPONIN I: 0.17 ng/mL — AB (ref <0.031)

## 2015-01-30 LAB — SODIUM, URINE, RANDOM: SODIUM UR: 12 mmol/L

## 2015-01-30 LAB — GLUCOSE, CAPILLARY
GLUCOSE-CAPILLARY: 176 mg/dL — AB (ref 70–99)
Glucose-Capillary: 135 mg/dL — ABNORMAL HIGH (ref 70–99)
Glucose-Capillary: 215 mg/dL — ABNORMAL HIGH (ref 70–99)

## 2015-01-30 LAB — HIV ANTIBODY (ROUTINE TESTING W REFLEX): HIV SCREEN 4TH GENERATION: NONREACTIVE

## 2015-01-30 MED ORDER — TORSEMIDE 20 MG PO TABS: 20.0000 mg | ORAL_TABLET | Freq: Two times a day (BID) | ORAL | Status: DC

## 2015-01-30 MED ORDER — ATORVASTATIN CALCIUM 40 MG PO TABS: 40.0000 mg | ORAL_TABLET | Freq: Every day | ORAL | Status: DC

## 2015-01-30 MED ORDER — ATORVASTATIN CALCIUM 40 MG PO TABS
40.0000 mg | ORAL_TABLET | Freq: Every day | ORAL | Status: DC
  Administered 2015-01-30: 40 mg via ORAL
  Filled 2015-01-30: qty 1

## 2015-01-30 NOTE — Discharge Summary (Signed)
Name: Douglas [name] MRN: [id number] DOB: March 12, 1976 39 y.o. PCP: [name], MD  Date of Admission: 01/29/2015  9:29 AM Date of Discharge: 01/30/2015 Attending Physician: Dr. Aletta Edouard  Discharge Diagnosis: Principal Problem:   Syncope Active Problems:   Diabetes mellitus with stage 2 chronic kidney disease   Dyslipidemia   Essential hypertension   NICM- last EF 35-40%, June 2015   Chronic combined systolic and diastolic congestive heart failure   Cocaine use   ICD (MDT) in place   Epigastric pain   Neck pain, bilateral   Normal coronary arteries-2006, 2012   Elevated troponin   Obesity-BMI 41-negative sleep study in the past   Chest pain   NICM (nonischemic cardiomyopathy)  Discharge Medications:   Medication List    STOP taking these medications        potassium chloride SA 20 MEQ tablet  Commonly known as:  K-DUR,KLOR-CON      TAKE these medications        acetaminophen-codeine 300-30 MG per tablet  Commonly known as:  TYLENOL #3  Take 1-2 tablets by mouth every 6 (six) hours as needed for moderate pain.     amiodarone 200 MG tablet  Commonly known as:  PACERONE  TAKE 1 TABLET BY MOUTH DAILY     atorvastatin 40 MG tablet  Commonly known as:  LIPITOR  Take 1 tablet (40 mg total) by mouth daily at 6 PM.     carvedilol 12.5 MG tablet  Commonly known as:  COREG  Take 1 tablet (12.5 mg total) by mouth 2 (two) times daily with a meal.     clotrimazole-betamethasone cream  Commonly known as:  LOTRISONE  Apply 1 application topically 2 (two) times daily.     hydrALAZINE 25 MG tablet  Commonly known as:  APRESOLINE  Take 1 tablet (25 mg total) by mouth 3 (three) times daily.     isosorbide mononitrate 30 MG 24 hr tablet  Commonly known as:  IMDUR  Take 1 tablet (30 mg total) by mouth daily.     lisinopril 5 MG tablet  Commonly known as:  PRINIVIL,ZESTRIL  Take 1 tablet (5 mg total) by mouth 2 (two) times daily.     metFORMIN 500 MG  tablet  Commonly known as:  GLUCOPHAGE  Take 1 tablet (500 mg total) by mouth 2 (two) times daily with a meal.     metolazone 2.5 MG tablet  Commonly known as:  ZAROXOLYN  Take 1 tablet (2.5 mg total) by mouth as needed.     spironolactone 25 MG tablet  Commonly known as:  ALDACTONE  Take 0.5 tablets (12.5 mg total) by mouth daily.     torsemide 20 MG tablet  Commonly known as:  DEMADEX  Take 1 tablet (20 mg total) by mouth 2 (two) times daily.        Disposition and follow-up:   [name] was discharged from [facility] in Good condition.  At the hospital follow up visit please address:  1. # AKI on CKD stage II: Repeat creatinine due on Monday 02/03/15 as a lab appointment. Consider resuming patient's prior dose of torsemide 60 mg BID given stabilization of creatinine.  # Hyperlipidemia: Pt started on Lipitor 40 mg daily. Assess for toleration.  2.  Labs / imaging needed at time of follow-up: Creatinine.  3.  Pending labs/ test needing follow-up: None  Follow-up Appointments: Follow-up Information    Follow up with [name], MD.  Go on 02/05/2015.   Specialty:  Internal Medicine   Why:  At 0245 PM   Contact information:   [address] [place] [address] [phone]       Follow up with LAB. Go on 02/03/2015.   Why:  At 0300 PM for Basic metabolic panel   Contact information:   Internal medicine clinic [address]  [place] [address]      Discharge Instructions: Discharge Instructions    Call MD for:  difficulty breathing, headache or visual disturbances    Complete by:  As directed      Call MD for:  extreme fatigue    Complete by:  As directed      Call MD for:  hives    Complete by:  As directed      Call MD for:  persistant dizziness or light-headedness    Complete by:  As directed      Call MD for:  persistant nausea and vomiting    Complete by:  As directed      Call MD for:  redness, tenderness, or signs of  infection (pain, swelling, redness, odor or green/yellow discharge around incision site)    Complete by:  As directed      Call MD for:  severe uncontrolled pain    Complete by:  As directed      Call MD for:  temperature >100.4    Complete by:  As directed      Diet - low sodium heart healthy    Complete by:  As directed      Increase activity slowly    Complete by:  As directed            Consultations:  Cardiology  Procedures Performed:  Dg Chest 2 View  01/29/2015   CLINICAL DATA:  Sternal chest pain for 3 days.  EXAM: CHEST  2 VIEW  COMPARISON:  November 30, 2014.  FINDINGS: The heart size and mediastinal contours are within normal limits. Both lungs are clear. No pneumothorax or pleural effusion is noted. Left-sided defibrillator is unchanged in position. The visualized skeletal structures are unremarkable.  IMPRESSION: No active cardiopulmonary disease.   Electronically Signed   By: Lupita Raider, M.D.   On: 01/29/2015 12:50   Ct Head Wo Contrast  01/29/2015   CLINICAL DATA:  39 year old male with syncopal episode, dizziness, fall. Head and neck pain. Initial encounter.  EXAM: CT HEAD WITHOUT CONTRAST  CT CERVICAL SPINE WITHOUT CONTRAST  TECHNIQUE: Multidetector CT imaging of the head and cervical spine was performed following the standard protocol without intravenous contrast. Multiplanar CT image reconstructions of the cervical spine were also generated.  COMPARISON:  10/13/2013.  FINDINGS: CT HEAD FINDINGS  Left chest cardiac AICD on the scout view.  Visualized orbit soft tissues are within normal limits. No scalp hematoma identified. Stable paranasal sinuses and mastoids. No calvarium fracture identified.  Cerebral volume remains normal. No midline shift, ventriculomegaly, mass effect, evidence of mass lesion, intracranial hemorrhage or evidence of cortically based acute infarction. Gray-white matter differentiation is within normal limits throughout the brain. No suspicious  intracranial vascular hyperdensity.  CT CERVICAL SPINE FINDINGS  Chronic straightening of cervical lordosis. Visualized skull base is intact. No atlanto-occipital dissociation. Cervicothoracic junction alignment is within normal limits. Bilateral posterior element alignment is within normal limits. No acute cervical spine fracture identified. Grossly intact visualized upper thoracic levels.  Stable lung apices.  Negative noncontrast paraspinal soft tissues.  IMPRESSION:  1. Stable and normal noncontrast CT appearance of the brain. 2. No acute fracture or listhesis identified in the cervical spine. Ligamentous injury is not excluded.   Electronically Signed   By: Odessa Fleming M.D.   On: 01/29/2015 11:01   Ct Cervical Spine Wo Contrast  01/29/2015   CLINICAL DATA:  39 year old male with syncopal episode, dizziness, fall. Head and neck pain. Initial encounter.  EXAM: CT HEAD WITHOUT CONTRAST  CT CERVICAL SPINE WITHOUT CONTRAST  TECHNIQUE: Multidetector CT imaging of the head and cervical spine was performed following the standard protocol without intravenous contrast. Multiplanar CT image reconstructions of the cervical spine were also generated.  COMPARISON:  10/13/2013.  FINDINGS: CT HEAD FINDINGS  Left chest cardiac AICD on the scout view.  Visualized orbit soft tissues are within normal limits. No scalp hematoma identified. Stable paranasal sinuses and mastoids. No calvarium fracture identified.  Cerebral volume remains normal. No midline shift, ventriculomegaly, mass effect, evidence of mass lesion, intracranial hemorrhage or evidence of cortically based acute infarction. Gray-white matter differentiation is within normal limits throughout the brain. No suspicious intracranial vascular hyperdensity.  CT CERVICAL SPINE FINDINGS  Chronic straightening of cervical lordosis. Visualized skull base is intact. No atlanto-occipital dissociation. Cervicothoracic junction alignment is within normal limits. Bilateral posterior  element alignment is within normal limits. No acute cervical spine fracture identified. Grossly intact visualized upper thoracic levels.  Stable lung apices.  Negative noncontrast paraspinal soft tissues.  IMPRESSION: 1. Stable and normal noncontrast CT appearance of the brain. 2. No acute fracture or listhesis identified in the cervical spine. Ligamentous injury is not excluded.   Electronically Signed   By: Odessa Fleming M.D.   On: 01/29/2015 11:01    2D Echo: None  Cardiac Cath: None  Admission HPI:   Patient is a 39 year old with morbid obesity, hypertension, hyperlipidemia, chronic kidney disease stage II, nonischemic cardiomyopathy with ICD placement who presents with chest pain as well as syncope.  Patient states that he has been having hurting in his head, neck, chest, and belly in the last couple days. He states that he has been dizzy for about three days. He states that he has a slight sensation that the room is spinning around him. He states that laying down improve his symptoms and states that it gets worse with sitting and standing.   Patient reports that he had a suspected episode of passing out after getting out of the shower. He states that he woke up in a chair and did not remember the events that led to him being in that chair. No one was around to witness his passing out. He does not remember experiencing any shocks from his device. He denies having a fall or any trauma secondary to the event. He also denies any shaking movements or associated bowel or bladder dysfunction around the episode.   Patient also reports some chest pain that was associated with diffuse pain throughout his body including his head, neck, and abdominal pain. He states that it was parasternal and a pressure sensation. He has not taking any medications to help with his pain. He reports some associated diaphoresis with this episode. He denies any associated nausea.   Patient reports some green colored diarrhea  yesterday for one time. He reports some nausea with clear vomitus. He otherwise denies any fevers, dyspnea, cough. Patient states that he smokes around 3-4 cigarettes a day. He denies any alcohol use in the last 3-4 months. He states that he used cocaine 1  week ago. He states that he is married and has four kids. Patient has a implanted ICD which patient states was placed because his heart was beating too fast.    Hospital Course by problem list: Principal Problem:   Syncope Active Problems:   Diabetes mellitus with stage 2 chronic kidney disease   Dyslipidemia   Essential hypertension   NICM- last EF 35-40%, June 2015   Chronic combined systolic and diastolic congestive heart failure   Cocaine use   ICD (MDT) in place   Epigastric pain   Neck pain, bilateral   Normal coronary arteries-2006, 2012   Elevated troponin   Obesity-BMI 41-negative sleep study in the past   Chest pain   NICM (nonischemic cardiomyopathy)  # Syncope: Pt was admitted after a syncopal episode when he found himself on his chair without remembering how he got there. EKG done in the ED was unchanged from previous. His troponin was positive at 0.20 and trended down to 0.17. Patient with previous admissions with elevated troponins as well. ICD was interrogated by Dr. Graciela Husbands with no evidence of discharges. Patient's urine drug test was positive for cocaine. Cardiology was consulted. It was thought that patient's syncope and elevated troponin were likely due to coronary spasm secondary to cocaine use. Patient was counseled on cocaine cessation. Cardiac monitoring remain uneventful throughout this admission. Patient with no recurrent episodes of syncope during his admission  # AKI on CKD stage II: On 01/30/15 pt had an increase on his creatinine to 1.71 from 1.27. This pt has documented CKD stage II with a GFR of 71 on 12/10/14 . Creatinine baseline is around 1.5. BUN and potassium remained within normal limits. Repeat  creatinine before discharge showed a stabilized creatinine at 1.79.  Pt has follow up with the internal medicine lab on Monday. Patient's torsemide was reduced to 20 mg BID.  # Non Ischemic cardiomyopathy: Pt has a documented history NICM s/p ICD placement in 2014, his last Echocardiogram was done on 05/01/14 and showed an EF of 35 to 40%, diffuse hypokinesis and grade 1 diastolic dysfunction. Pt did not appear to be decompensated during this admission.  BNP was normal at 37.6 At home patient is on Carvedilol 12.5 mg BID,Imdur 30 mg QD, Hydralazine 25 mg TID,Torsemide 60 mg BID ,spironolactone 12.5 mg daily, and metolazone 2.5 mg as needed for volume overload. Will resume home medication at discharge with reduction of torsemide to 20 mg BID.  # Hypertension: Pt home regimen consists ofCarvedilol 12.5 mg BID,Imdur 30 mg QD, Hydralazine 25 mg TID, Torsemide 60 mg BID ,spironolactone 12.5 mg daily. Patient's home medication regimen was resumed upon discharge with the exception of torsemidea as noted above.  # Cocaine abuse: Pt UDS was positive for cocaine. Pt was counseled on cocaine cessation.    # Diabetes Mellitus type 2: Pt takes Metformin 500 mg BID at home. January Hb A1c of 7.5. CBG have been in the 100's for the most part during this admission.  # Hyperlipidemia: Lipid profile obtained in January 2016 Total cholesterol 235, Triglyceride 182, HDL 45, LDL 154.Pt started on Lipitor 40 mg daily.   Discharge Vitals:   BP 99/56 mmHg  Pulse 65  Temp(Src) 97.4 F (36.3 C) (Oral)  Resp 16  Ht 6\' 4"  (1.93 m)  Wt 327 lb 0.8 oz (148.35 kg)  BMI 39.83 kg/m2  SpO2 96%  Discharge Labs:  No results found for this or any previous visit (from the past 24 hour(s)).  Signed: Lyman Bishop  Loma Newton, MD 02/01/2015, 9:52 AM    Services Ordered on Discharge: None Equipment Ordered on Discharge: None

## 2015-01-30 NOTE — Progress Notes (Signed)
Subjective:  Patient states that he has done well overnight. His prescribed Percocet 7 helped with his headache and diffuse pain overall. Patient was further counseled on the importance of cocaine cessation. Patient states that he has reconnected with [facility] as an outpatient for substance abuse counseling.  Objective: Vital signs in last 24 hours: Filed Vitals:   01/29/15 2208 01/30/15 0012 01/30/15 0530 01/30/15 0840  BP: 101/56 100/55 98/64 120/74  Pulse: 65 77 70 67  Temp: 97.7 F (36.5 C) 97.8 F (36.6 C) 97.3 F (36.3 C) 97.6 F (36.4 C)  TempSrc: Oral Oral Oral Oral  Resp: 18 18 16 17   Height:      Weight:   327 lb 0.8 oz (148.35 kg)   SpO2: 99% 99% 100% 96%   Weight change:   Intake/Output Summary (Last 24 hours) at 01/30/15 1114 Last data filed at 01/30/15 0951  Gross per 24 hour  Intake    915 ml  Output    450 ml  Net    465 ml    General: resting in bed, in no acute distress HEENT: PERRL, EOMI, no scleral icterus Cardiac: RRR, no rubs, murmurs or gallops Pulm: clear to auscultation bilaterally, moving normal volumes of air Abd: soft, nontender, nondistended, BS present Ext: warm and well perfused, no pedal edema Neuro: alert and oriented X3, cranial nerves II-XII grossly intact Skin: no rashes or lesions noted Psych: appropriate affect  Lab Results: Basic Metabolic Panel:  Recent Labs Lab 01/29/15 1020 01/30/15 0530  NA 135 135  K 3.5 3.8  CL 98 96  CO2 28 26  GLUCOSE 190* 134*  BUN 12 17  CREATININE 1.27 1.72*  CALCIUM 8.9 8.9  MG  --  1.8   Liver Function Tests:  Recent Labs Lab 01/29/15 1020  AST 43*  ALT 54*  ALKPHOS 100  BILITOT 0.6  PROT 7.5  ALBUMIN 3.7    Recent Labs Lab 01/29/15 1020  LIPASE 36   No results for input(s): AMMONIA in the last 168 hours. CBC:  Recent Labs Lab 01/29/15 1020  WBC 7.9  NEUTROABS 3.7  HGB 14.9  HCT 43.5  MCV 88.4  PLT 208   Cardiac Enzymes:  Recent Labs Lab 01/29/15 1020  01/29/15 1831 01/29/15 2247  TROPONINI 0.20* 0.17* 0.17*   BNP: No results for input(s): PROBNP in the last 168 hours. D-Dimer:  Recent Labs Lab 01/29/15 1527  DDIMER 0.40   CBG:  Recent Labs Lab 01/29/15 1749 01/29/15 2108 01/29/15 2312 01/30/15 0646  GLUCAP 160* 202* 151* 135*   Hemoglobin A1C: No results for input(s): HGBA1C in the last 168 hours. Fasting Lipid Panel: No results for input(s): CHOL, HDL, LDLCALC, TRIG, CHOLHDL, LDLDIRECT in the last 168 hours. Thyroid Function Tests: No results for input(s): TSH, T4TOTAL, FREET4, T3FREE, THYROIDAB in the last 168 hours. Coagulation: No results for input(s): LABPROT, INR in the last 168 hours. Anemia Panel: No results for input(s): VITAMINB12, FOLATE, FERRITIN, TIBC, IRON, RETICCTPCT in the last 168 hours. Urine Drug Screen: Drugs of Abuse     Component Value Date/Time   LABOPIA NONE DETECTED 01/29/2015 1214   COCAINSCRNUR POSITIVE* 01/29/2015 1214   LABBENZ NONE DETECTED 01/29/2015 1214   AMPHETMU NONE DETECTED 01/29/2015 1214   THCU NONE DETECTED 01/29/2015 1214   LABBARB NONE DETECTED 01/29/2015 1214    Alcohol Level: No results for input(s): ETH in the last 168 hours. Urinalysis:  Recent Labs Lab 01/29/15 1214  COLORURINE YELLOW  LABSPEC 1.008  PHURINE 6.0  GLUCOSEU NEGATIVE  HGBUR NEGATIVE  BILIRUBINUR NEGATIVE  KETONESUR NEGATIVE  PROTEINUR NEGATIVE  UROBILINOGEN 0.2  NITRITE NEGATIVE  LEUKOCYTESUR NEGATIVE   Micro Results: No results found for this or any previous visit (from the past 240 hour(s)). Studies/Results: Dg Chest 2 View  01/29/2015   CLINICAL DATA:  Sternal chest pain for 3 days.  EXAM: CHEST  2 VIEW  COMPARISON:  November 30, 2014.  FINDINGS: The heart size and mediastinal contours are within normal limits. Both lungs are clear. No pneumothorax or pleural effusion is noted. Left-sided defibrillator is unchanged in position. The visualized skeletal structures are unremarkable.   IMPRESSION: No active cardiopulmonary disease.   Electronically Signed   By: Lupita Raider, M.D.   On: 01/29/2015 12:50   Ct Head Wo Contrast  01/29/2015   CLINICAL DATA:  39 year old male with syncopal episode, dizziness, fall. Head and neck pain. Initial encounter.  EXAM: CT HEAD WITHOUT CONTRAST  CT CERVICAL SPINE WITHOUT CONTRAST  TECHNIQUE: Multidetector CT imaging of the head and cervical spine was performed following the standard protocol without intravenous contrast. Multiplanar CT image reconstructions of the cervical spine were also generated.  COMPARISON:  10/13/2013.  FINDINGS: CT HEAD FINDINGS  Left chest cardiac AICD on the scout view.  Visualized orbit soft tissues are within normal limits. No scalp hematoma identified. Stable paranasal sinuses and mastoids. No calvarium fracture identified.  Cerebral volume remains normal. No midline shift, ventriculomegaly, mass effect, evidence of mass lesion, intracranial hemorrhage or evidence of cortically based acute infarction. Gray-white matter differentiation is within normal limits throughout the brain. No suspicious intracranial vascular hyperdensity.  CT CERVICAL SPINE FINDINGS  Chronic straightening of cervical lordosis. Visualized skull base is intact. No atlanto-occipital dissociation. Cervicothoracic junction alignment is within normal limits. Bilateral posterior element alignment is within normal limits. No acute cervical spine fracture identified. Grossly intact visualized upper thoracic levels.  Stable lung apices.  Negative noncontrast paraspinal soft tissues.  IMPRESSION: 1. Stable and normal noncontrast CT appearance of the brain. 2. No acute fracture or listhesis identified in the cervical spine. Ligamentous injury is not excluded.   Electronically Signed   By: Odessa Fleming M.D.   On: 01/29/2015 11:01   Ct Cervical Spine Wo Contrast  01/29/2015   CLINICAL DATA:  39 year old male with syncopal episode, dizziness, fall. Head and neck pain.  Initial encounter.  EXAM: CT HEAD WITHOUT CONTRAST  CT CERVICAL SPINE WITHOUT CONTRAST  TECHNIQUE: Multidetector CT imaging of the head and cervical spine was performed following the standard protocol without intravenous contrast. Multiplanar CT image reconstructions of the cervical spine were also generated.  COMPARISON:  10/13/2013.  FINDINGS: CT HEAD FINDINGS  Left chest cardiac AICD on the scout view.  Visualized orbit soft tissues are within normal limits. No scalp hematoma identified. Stable paranasal sinuses and mastoids. No calvarium fracture identified.  Cerebral volume remains normal. No midline shift, ventriculomegaly, mass effect, evidence of mass lesion, intracranial hemorrhage or evidence of cortically based acute infarction. Gray-white matter differentiation is within normal limits throughout the brain. No suspicious intracranial vascular hyperdensity.  CT CERVICAL SPINE FINDINGS  Chronic straightening of cervical lordosis. Visualized skull base is intact. No atlanto-occipital dissociation. Cervicothoracic junction alignment is within normal limits. Bilateral posterior element alignment is within normal limits. No acute cervical spine fracture identified. Grossly intact visualized upper thoracic levels.  Stable lung apices.  Negative noncontrast paraspinal soft tissues.  IMPRESSION: 1. Stable and normal noncontrast CT appearance of the brain. 2.  No acute fracture or listhesis identified in the cervical spine. Ligamentous injury is not excluded.   Electronically Signed   By: Odessa Fleming M.D.   On: 01/29/2015 11:01   Medications: I have reviewed the patient's current medications. Scheduled Meds: . amiodarone  200 mg Oral Daily  . aspirin EC  325 mg Oral Daily  . carvedilol  12.5 mg Oral BID WC  . enoxaparin (LOVENOX) injection  70 mg Subcutaneous Q24H  . hydrALAZINE  25 mg Oral TID  . insulin aspart  0-15 Units Subcutaneous TID WC  . insulin aspart  0-5 Units Subcutaneous QHS  . isosorbide  mononitrate  30 mg Oral Daily  . lisinopril  5 mg Oral BID  . potassium chloride SA  20 mEq Oral Daily  . sodium chloride  3 mL Intravenous Q12H  . spironolactone  12.5 mg Oral Daily  . torsemide  60 mg Oral BID   Continuous Infusions:  PRN Meds:.acetaminophen **OR** acetaminophen, morphine injection, oxyCODONE-acetaminophen Assessment/Plan: Principal Problem:   Syncope Active Problems:   Diabetes mellitus with stage 2 chronic kidney disease   Dyslipidemia   Essential hypertension   NICM- last EF 35-40%, June 2015   Chronic combined systolic and diastolic congestive heart failure   Cocaine use   ICD (MDT) in place   Epigastric pain   Neck pain, bilateral   Normal coronary arteries-2006, 2012   Elevated troponin   Obesity-BMI 41-negative sleep study in the past   Chest pain  Patient is a 39 year old with morbid obesity, type 2 diabetes, hypertension, hyperlipidemia, chronic kidney disease stage II, nonischemic cardiomyopathy with ICD placement who presents with chest pain and syncope.  Syncope associated with chest, abdominal, hip pain: Patient states that these symptoms have largely resolved since his admission. Likelihood of cardiac etiology is less likely given no shocks noted from interrogation of his ICD. Additionally, troponins have continued to trend down likely in the setting of demand ischemia. Orthostatic vitals unremarkable for orthostatic hypotension. It is not entirely clear what the etiology of his presenting symptoms may have been, though it is possible that coronary vasospasm in setting of cocaine abuse may have been contributory. -Appreciate cardiology consultation -Given recovery, possible discharge home today.  Nonischemic cardiomyopathy with systolic heart failure: No charges from ICD noted on interrogation making this less likely to be contributory to patient's initial presenting symptoms. Patient is status post ICD placement patient with an echocardiogram from  June 2015 showing ejection fraction of 35-40% with diffuse hypokinesis and grade 1 diastolic dysfunction. Patient does not currently exhibit any signs of fluid overload. BNP within normal limits. Patient is currently on Coreg 12.5 mg twice a day, hydralazine 25 mg 3 times a day, Imdur 30 mg daily, lisinopril 5 mg twice a day, spironolactone 12.5 mg daily, torsemide 60 mg twice a day, and metolazone 2.5 mg when necessary for fluid overload.  -Magnesium within acceptable range of 1.8. -Continue home medications except for metolazone.  AKI on Stage II chronic kidney disease: Patient's creatinine is elevated to 1.72 from 1.27 this morning. There is no concurrent rise in BUN to support prerenal etiology. No concurrent signs of volume overload, obstruction, or volume depletion. Patient states that he is compliant with all of his medications including his lisinopril at home. Potassium is within the normal range.  Hypertension: Patient's home regimen as listed above. Patient's blood pressures have been within the normal range since initial evaluation. -As indicated above for nonischemic cardiomyopathy.  Type 2 diabetes: Patient's blood glucoses  have been within acceptable range in the low to mid 100s since admission. Patient with a recent hemoglobin A1c of 7.5. Patient is on metformin 500 mg twice a day. -Moderate sliding scale insulin with nighttime coverage  -Hold metformin as inpatient.  Polysubstance abuse: Patient tests positive for cocaine consistently. Patient states that he last used cocaine one week ago. -Counsel patient on importance of cocaine cessation.  Diet: Heart healthy diet Prophylaxis: Lovenox 40 mg daily Code: Full code  Dispo: Disposition is deferred at this time, awaiting improvement of current medical problems. Anticipated discharge in approximately 0 day(s).   The patient does have a current PCP ([name], MD) and does need an [facility] hospital follow-up appointment after  discharge.  The patient does not have transportation limitations that hinder transportation to clinic appointments.     Services Needed at time of discharge: Y = Yes, Blank = No PT:   OT:   RN:   Equipment:   Other:    Harold Barban, MD 01/30/2015, 11:14 AM

## 2015-01-30 NOTE — Progress Notes (Signed)
UR completed 

## 2015-01-30 NOTE — Discharge Instructions (Signed)
Please take 20 mg torsemide twice a day. This is a temporary reduction until you can see [place] back in the clinic and we can assess how your kidneys are doing.

## 2015-01-30 NOTE — Care Management Note (Unsigned)
    Page 1 of 1   01/30/2015     1:59:17 PM CARE MANAGEMENT NOTE 01/30/2015  Patient:  Miske,Josiah L   Account Number:  [id number]  Date Initiated:  01/30/2015  Documentation initiated by:  Parisa Pinela  Subjective/Objective Assessment:   Pt adm on 01/29/15 with HA, CP, syncope.  PTA, pt independent, resides at home with mother.     Action/Plan:   Will follow for dc needs as pt progresses.   Anticipated DC Date:  01/31/2015   Anticipated DC Plan:  HOME/SELF CARE      DC Planning Services  CM consult      Choice offered to / List presented to:             Status of service:  In process, will continue to follow Medicare Important Message given?   (If response is "NO", the following Medicare IM given date fields will be blank) Date Medicare IM given:   Medicare IM given by:   Date Additional Medicare IM given:   Additional Medicare IM given by:    Discharge Disposition:    Per UR Regulation:  Reviewed for med. necessity/level of care/duration of stay  If discussed at Long Length of Stay Meetings, dates discussed:    Comments:

## 2015-01-30 NOTE — Progress Notes (Signed)
The patient's VSS were stable overnight and he stated that he slept most of the night.  He did not complain of any dizziness, but stated that he had a headache and received Percocet for it with relief.

## 2015-01-30 NOTE — Progress Notes (Signed)
Subjective: Pt has no complaint today, he denies any pain or discomfort.   Objective: BP 120/74 mmHg  Pulse 67  Temp(Src) 97.6 F (36.4 C) (Oral)  Resp 17  Ht 6\' 4"  (1.93 m)  Wt 148.35 kg (327 lb 0.8 oz)  BMI 39.83 kg/m2  SpO2 96%  Intake/Output Summary (Last 24 hours) at 01/30/15 1048 Last data filed at 01/30/15 0951  Gross per 24 hour  Intake    915 ml  Output    450 ml  Net    465 ml    Physical Exam:   Alert and oriented X 3 GEN: non apparent distress, lying in bed. EYES: EOMI ENT: MMM CV: Hear rate regular, No murmur. S1 S2  PULM: respiration regular unlabored.            Lung auscultation: lungs sounds clear bilaterally. ABD: Obese,soft, non tender, no organomegaly noted by palpation, positive bowel sounds on auscultation. SKIN: Warm, intact, no lesions.  EXT: No edema  Labs/Studies: I have reviewed labs and studies from last 24hrs per EMR.  Medications: I have reviewed the patient's current medications.  Assessment/Plan: Principal Problem:   Syncope Active Problems:   Diabetes mellitus with stage 2 chronic kidney disease   Dyslipidemia   Essential hypertension   NICM- last EF 35-40%, June 2015   Chronic combined systolic and diastolic congestive heart failure   Cocaine use   ICD (MDT) in place   Epigastric pain   Neck pain, bilateral   Normal coronary arteries-2006, 2012   Elevated troponin   Obesity-BMI 41-negative sleep study in the past   Chest pain     Douglas [name] is a 39 y.o. male with PMHx of non ischemic cardiomyopathy s/p ICD placement , hypertension, cocaine abuse, CKD stahe II, Hypertension, DM type II who presented with Syncope.   # Syncope: pt had no event overnight, his ICD was interrogated by Dr. 41 and it has not fired, he also addressed issues in the ICD setting. Elevated troponin is trending down from 0.20 to 0.17. He has no more chest discomfort. He admits using cocaine yesterday.  Syncope and elevated troponin more  likely due to coronary spasm from cocaine use and demand ischemia. -conseil pt on cocaine use cessations.  -Continue cardiac monitoring.  # AKI on CKD stage II: pt has an increase on his creatinine to 1.71 from 1.27. This pt has documented CKD stage II with a GFR of 71 on 12/10/14 . Creatinine baseline is around 1.5. Increase on his creatinine is more likely due to decreased po intake related to current illness. -urine sodium -FeNa -urine dipstick  # Non Ischemic cardiomyopathy: pt has a documented history NICM s/p ICD placement in 2014, his last Echocardiogram was done on 05/01/14 and showed an EF of 35 to 40% , diffuse hypokinesis and grade 1 diastolic dysfunction. Pt doesn't appear to be decompensated at this time( lungs are clear and there is no edema noted). BNP normal ( 37.6) At home patient is on Carvedilol 12.5 mg BID,Imdur 30 mg QD, Hydralazine 25 mg TID, Torsemide 60 mg BID ,spironolactone 12.5 mg daily, and metolazone 2.5 mg as needed for volume overload.  - continue home Torsemide 60 mg QD -continue home spironolactone 12.5 mg daily - continue Coreg 12.5 BID - continue home Imdur 30 mg QD -Continue Hydralazine 25 mg TID  -hold metolazone.   # NSTEMI: pt has and elevated troponin of 0.20 on admission which trended down to 0.17 X 2,  he had some chest pressure on admission that is actually resolved at this time. The chest pat pain was like a pressure  but localized mainly to the epigastric area, not related to activity . The pain was not radiating but was associated with neck pain, back pain and headache. The pt feats the criteria for NSTEMI type II ( NSTEMI associated with demand ischemia). EKG with no specific T changes and prolonged QT/QTc( Improved from previous EKG).  -Troponin trended down. -cardiac monitoring.  # Hypertension: pt home regiment consists of Carvedilol 12.5 mg BID,Imdur 30 mg QD, Hydralazine 25 mg TID, Torsemide 60 mg daily ,spironolactone 12.5 mg daily, -  continue home Torsemide 60 mg daily consider cutting down if creatinine doesn't improve.  -continue home spironolactone 12.5 mg daily - continue Coreg 12.5 twice a day - continue home Imdur 30 mg Daily -Continue Hydralazine 25 mg three times a day.  # cocaine abuse:  -substance abuse counseling.   # Diabetes Mellitus type 2: pt takes Metformin 500 mg BID at home. January Hb A1c of 7.5.  -CBG monitoring before meals. -SSI -hold metformin  # Hyperlipidemia: Lipid profile obtained in January 2016 Total cholesterol 235, Triglyceride 182, HDL 45, LDL 154.    -pt started on Lipitor 40 mg daily.  Diet: Heart healthy diet Prophylaxis: Lovenox 40 mg daily Code: Full code  Dispo: Disposition is deferred at this time, awaiting improvement of current medical problems. Anticipated discharge today.  The patient does have a current PCP ([name], MD) and does need an [facility] hospital follow-up appointment after discharge.  The patient does not have transportation limitations that hinder transportation to clinic appointments. This is a Psychologist, occupational Note.  The care of the patient was discussed with Dr. Loma Newton and the assessment and plan formulated with their assistance.  Please see their attached note for official documentation of the daily encounter.

## 2015-01-31 ENCOUNTER — Other Ambulatory Visit (HOSPITAL_COMMUNITY): Payer: Self-pay | Admitting: Adult Health

## 2015-01-31 ENCOUNTER — Encounter: Payer: Self-pay | Admitting: Internal Medicine

## 2015-01-31 DIAGNOSIS — R9431 Abnormal electrocardiogram [ECG] [EKG]: Secondary | ICD-10-CM | POA: Insufficient documentation

## 2015-01-31 DIAGNOSIS — I428 Other cardiomyopathies: Secondary | ICD-10-CM | POA: Insufficient documentation

## 2015-01-31 LAB — HEMOGLOBIN A1C
HEMOGLOBIN A1C: 7.5 % — AB (ref 4.8–5.6)
MEAN PLASMA GLUCOSE: 169 mg/dL

## 2015-02-01 LAB — UREA NITROGEN, URINE: Urea Nitrogen, Ur: 482 mg/dL

## 2015-02-03 ENCOUNTER — Other Ambulatory Visit: Payer: Self-pay

## 2015-02-03 ENCOUNTER — Other Ambulatory Visit: Payer: Self-pay | Admitting: Internal Medicine

## 2015-02-03 ENCOUNTER — Other Ambulatory Visit (INDEPENDENT_AMBULATORY_CARE_PROVIDER_SITE_OTHER): Payer: Medicaid Other

## 2015-02-03 DIAGNOSIS — I10 Essential (primary) hypertension: Secondary | ICD-10-CM

## 2015-02-03 LAB — BASIC METABOLIC PANEL WITH GFR
BUN: 19 mg/dL (ref 6–23)
CO2: 26 meq/L (ref 19–32)
Calcium: 9.1 mg/dL (ref 8.4–10.5)
Chloride: 99 mEq/L (ref 96–112)
Creat: 1.25 mg/dL (ref 0.50–1.35)
GFR, Est African American: 83 mL/min
GFR, Est Non African American: 72 mL/min
Glucose, Bld: 182 mg/dL — ABNORMAL HIGH (ref 70–99)
Potassium: 4 mEq/L (ref 3.5–5.3)
SODIUM: 136 meq/L (ref 135–145)

## 2015-02-05 ENCOUNTER — Encounter: Payer: Self-pay | Admitting: Internal Medicine

## 2015-02-05 ENCOUNTER — Ambulatory Visit (INDEPENDENT_AMBULATORY_CARE_PROVIDER_SITE_OTHER): Payer: Medicaid Other | Admitting: Internal Medicine

## 2015-02-05 VITALS — BP 126/80 | HR 80 | Temp 98.1°F | Ht 76.0 in | Wt 336.8 lb

## 2015-02-05 DIAGNOSIS — E785 Hyperlipidemia, unspecified: Secondary | ICD-10-CM

## 2015-02-05 DIAGNOSIS — I129 Hypertensive chronic kidney disease with stage 1 through stage 4 chronic kidney disease, or unspecified chronic kidney disease: Secondary | ICD-10-CM

## 2015-02-05 DIAGNOSIS — N182 Chronic kidney disease, stage 2 (mild): Secondary | ICD-10-CM

## 2015-02-05 DIAGNOSIS — I5042 Chronic combined systolic (congestive) and diastolic (congestive) heart failure: Secondary | ICD-10-CM

## 2015-02-05 DIAGNOSIS — I10 Essential (primary) hypertension: Secondary | ICD-10-CM

## 2015-02-05 DIAGNOSIS — E1122 Type 2 diabetes mellitus with diabetic chronic kidney disease: Secondary | ICD-10-CM

## 2015-02-05 DIAGNOSIS — R55 Syncope and collapse: Secondary | ICD-10-CM

## 2015-02-05 MED ORDER — ISOSORBIDE MONONITRATE ER 30 MG PO TB24: 30.0000 mg | ORAL_TABLET | Freq: Every day | ORAL | Status: DC

## 2015-02-05 MED ORDER — AMIODARONE HCL 200 MG PO TABS: 200.0000 mg | ORAL_TABLET | Freq: Every day | ORAL | Status: DC

## 2015-02-05 MED ORDER — METFORMIN HCL 500 MG PO TABS: 500.0000 mg | ORAL_TABLET | Freq: Two times a day (BID) | ORAL | Status: DC

## 2015-02-05 MED ORDER — ATORVASTATIN CALCIUM 40 MG PO TABS: 40.0000 mg | ORAL_TABLET | Freq: Every day | ORAL | Status: DC

## 2015-02-05 MED ORDER — TORSEMIDE 20 MG PO TABS: 60.0000 mg | ORAL_TABLET | Freq: Two times a day (BID) | ORAL | Status: DC

## 2015-02-05 MED ORDER — SPIRONOLACTONE 25 MG PO TABS: 12.5000 mg | ORAL_TABLET | Freq: Every day | ORAL | Status: DC

## 2015-02-05 NOTE — Patient Instructions (Signed)
-  Start taking torsemide 60 mg twice a day in addition to all of your other medications, I sent them to [facility] -Please come back in 1 month for follow-up of your diabetes -Make an appointment with cardiology soon, nice seeing you again!   General Instructions:   Please bring your medicines with you each time you come to clinic.  Medicines may include prescription medications, over-the-counter medications, herbal remedies, eye drops, vitamins, or other pills.   Progress Toward Treatment Goals:  No flowsheet data found.  Self Care Goals & Plans:  Self Care Goal 12/10/2014  Manage my medications take my medicines as prescribed; bring my medications to every visit; refill my medications on time  Eat healthy foods eat foods that are low in salt; eat baked foods instead of fried foods  Be physically active find an activity I enjoy  Stop smoking cut down the number of cigarettes smoked; go to the [employer] ([email or web address])    No flowsheet data found.   Care Management & Community Referrals:  No flowsheet data found.

## 2015-02-05 NOTE — Progress Notes (Signed)
Patient ID: Douglas [name], male   DOB: 1976-04-19, 39 y.o.   MRN: [id number]    Subjective:   Patient ID: Douglas [name] male   DOB: 1976/03/30 39 y.o.   MRN: [id number]  HPI: [name] is a 39 y.o. 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   He was hospitalized from 3/16-17 for syncope thought to be due from coronary vasospasm from cocaine use. His ICD was interrogated which did not reveal discharges (he had firing in January in setting of cocaine use). He had no evidence of arrhythmia on telemetry. His QTc was prolonged at 533. His orthostatic labs were negative during hospitalization. He reports he is trying to cut back on cocaine use and uses it because it helps with low back pain. His last 2D-echo on 05/01/14 revealed EF 35-40% with grade 1 diastolic dysfunction and no valvular dysfunction. He reports he has been without some of his medications for the past 2 weeks including imdur, amlodipine, hydralazine, and spironolactone. His torsemide was reduced from 60 mg BID to 20 mg BID on discharge in setting of elevated Cr of 1.79 from baseline 1.5. He continues to have lightheadedness and occasionally palpitations but denies chest pain, LE edema, or syncope. His weight is stable from last clinic visit in January and denies dietary indiscretion. He has not had to use metolazone as needed for fluid overload and is no longer taking potassium supplements.   He has not yet filled prescriptions for atorvastatin or metformin that I prescribed to him at last visit in January.     Past Medical History  Diagnosis Date  . Hypertension   . Systolic CHF, chronic   . Obesity   . Nonischemic cardiomyopathy     a.  echo 4/06: EF 30%, mild to mod MR, mild RAE, inf HK, lat HK , ant HK;     b.  cath 4/06: no CAD, EF 20-25%  . ED (erectile dysfunction)   . NSVT (nonsustained ventricular tachycardia)     eval by EP in past; no ICD candidate due to NYHA 1 symptoms  . Automatic implantable cardioverter-defibrillator in situ   . Exertional shortness of breath   . Arthritis     ra   Current Outpatient Prescriptions  Medication Sig Dispense Refill  . acetaminophen-codeine (TYLENOL #3) 300-30 MG per tablet Take 1-2 tablets by mouth every 6 (six) hours as needed for moderate pain. (Patient not taking: Reported on 01/29/2015) 15 tablet 0  . amiodarone (PACERONE) 200 MG tablet TAKE 1 TABLET BY MOUTH DAILY 30 tablet 2  . atorvastatin (LIPITOR) 40 MG tablet Take 1 tablet (40 mg total) by mouth daily at 6 PM. 30 tablet 0  . carvedilol (COREG) 12.5 MG tablet Take 1 tablet (12.5 mg total) by mouth 2 (two) times daily with a meal. 60 tablet 3  . clotrimazole-betamethasone (LOTRISONE) cream Apply 1 application topically 2 (two) times daily. (Patient not taking: Reported on 01/29/2015) 45 g 0  . hydrALAZINE (APRESOLINE) 25 MG tablet Take 1 tablet (25 mg total) by mouth 3 (three) times daily. 90 tablet 1  . isosorbide mononitrate (IMDUR) 30 MG 24 hr tablet Take 1 tablet (30 mg total) by mouth daily. 30 tablet 3  . lisinopril (PRINIVIL,ZESTRIL) 5 MG tablet Take 1 tablet (5 mg total) by  mouth 2 (two) times daily. 60 tablet 3  . metFORMIN (GLUCOPHAGE) 500 MG tablet Take 1 tablet (500 mg total) by mouth 2 (two) times daily with a meal. (Patient not taking: Reported on 01/29/2015) 60 tablet 3  . metolazone (ZAROXOLYN) 2.5 MG tablet Take 1 tablet (2.5 mg total) by mouth as needed. (Patient taking differently: Take 2.5 mg by mouth as needed (fluid). ) 8 tablet 3  . potassium chloride SA (K-DUR,KLOR-CON) 20 MEQ tablet TAKE 1 TABLET BY MOUTH TWICE DAILY 68 tablet 2  . spironolactone (ALDACTONE) 25 MG tablet Take 0.5 tablets (12.5 mg total) by mouth daily. 15 tablet 3  . torsemide (DEMADEX) 20 MG tablet Take 1  tablet (20 mg total) by mouth 2 (two) times daily. 180 tablet 3   No current facility-administered medications for this visit.   Family History  Problem Relation Age of Onset  . Coronary artery disease Mother 10    s/p PCI  . Lung cancer Father   . Diabetes type II Maternal Uncle   . Coronary artery disease Maternal Uncle   . Stroke Neg Hx   . Heart attack Neg Hx    History   Social History  . Marital Status: Legally Separated    Spouse Name: N/A  . Number of Children: 5  . Years of Education: N/A   Occupational History  . unemployed    Social History Main Topics  . Smoking status: Current Every Day Smoker -- 0.12 packs/day for 8 years    Types: Cigarettes  . Smokeless tobacco: Never Used  . Alcohol Use: 0.0 oz/week    0 Standard drinks or equivalent per week     Comment: 08/23/2013  "rarely have a beer anymore; last beer was a long time ago"  . Drug Use: Yes    Special: Cocaine     Comment: 11/30/14 today,  used cocaine one week ago  . Sexual Activity: Not Currently   Other Topics Concern  . Not on file   Social History Narrative   Review of Systems: Review of Systems  Constitutional: Positive for malaise/fatigue. Negative for fever and chills.  Eyes: Positive for blurred vision.  Respiratory: Positive for cough, shortness of breath (chronic with exertion ) and wheezing.   Cardiovascular: Positive for palpitations. Negative for chest pain and leg swelling.  Gastrointestinal: Positive for nausea, diarrhea and constipation. Negative for vomiting and abdominal pain.  Genitourinary: Negative for dysuria, urgency and frequency.  Musculoskeletal: Positive for back pain.  Neurological: Positive for dizziness and headaches.  Psychiatric/Behavioral: Positive for substance abuse (cocaine).    Objective:  Physical Exam: Filed Vitals:   02/05/15 1459  BP: 126/80  Pulse: 80  Temp: 98.1 F (36.7 C)  TempSrc: Oral  Height: 6\' 4"  (1.93 m)  Weight: 336 lb 12.8 oz  (152.771 kg)  SpO2: 98%   Physical Exam  Constitutional: He is oriented to person, place, and time. He appears well-developed and well-nourished. No distress.  HENT:  Head: Normocephalic and atraumatic.  Right Ear: External ear normal.  Left Ear: External ear normal.  Nose: Nose normal.  Mouth/Throat: Oropharynx is clear and moist. No oropharyngeal exudate.  Eyes: Conjunctivae and EOM are normal. Pupils are equal, round, and reactive to light. Right eye exhibits no discharge. Left eye exhibits no discharge. No scleral icterus.  Neck: Normal range of motion. Neck supple.  Cardiovascular: Normal rate and regular rhythm.   Distant heart sounds  Pulmonary/Chest: Effort normal and breath sounds normal. No respiratory distress. He  has no wheezes. He has no rales.  Abdominal: Soft. Bowel sounds are normal. He exhibits no distension. There is no tenderness. There is no rebound and no guarding.  Musculoskeletal: Normal range of motion. He exhibits edema (trace b/l LE ). He exhibits no tenderness.  Neurological: He is alert and oriented to person, place, and time.  Skin: Skin is warm and dry. No rash noted. He is not diaphoretic. No erythema. No pallor.  Psychiatric: He has a normal mood and affect. His behavior is normal. Judgment and thought content normal.    Assessment & Plan:   Please see problem list for problem-based assessment and plan

## 2015-02-07 NOTE — Assessment & Plan Note (Addendum)
Asessment: Pt with recent hospitalization for syncope thought to be due coronary vasospasm in setting of cocaine use with last echo in June 2015 with EF 35-50% and ICD in place who presents with persistent lightheadedness without evidence of orthostatic hypotension.   Plan:  -Pt counseled on cocaine cessation  -Continue amiodarone 200 mg daily for history of NSVT -Pt instructed to follow-up with cardiology if continues to have symptoms of lightheadedness, consider rechecking 2D-echo to assess EF and valvular function   -Pt to have ICD device check on April 12

## 2015-02-07 NOTE — Assessment & Plan Note (Addendum)
Assessment: Pt with well-controlled hypertension partially compliant with four-class (BB, ACEi, diuretic, nitrate) anti-hypertensive therapy who presents with blood pressure of 126/80.  Plan:  -BP 126/80 at goal <140/90 -Increase torsemide from 20 mg BID to normal regimen of 60 mg BID -Continue lisinopril 5 mg BID, carvedilol 12.5 mg BID, hydralazine 25 mg TID, imdur 30 mg daily, and spironolactone 12.5 mg daily  -BMP on 02/03/15 ---> normal K (pt no longer on potassium supplementation) and stable CKD Stage 2 with Cr at baseline 1.2

## 2015-02-07 NOTE — Assessment & Plan Note (Addendum)
Assessment: Pt with last 2D-echo on 05/01/14 with combined CHF partially compliant with medical therapy who presents with no acute exacerbation.   Plan:  -Wt 339 lb at last clinic visit 12/10/14 to 336 lb today -Increase torsemide from 20 mg BID to normal regimen of 60 mg BID -Continue lisinopril 5 mg BID, carvedilol 12.5 mg BID, hydralazine 25 mg TID, imdur 30 mg daily, spironolactone 12.5 mg daily ,and metolazone 2.5 mg PRN fluid overload -BMP on 02/03/15 ---> normal K (pt no longer on potassium supplementation) and stable CKD Stage 2 with Cr at baseline 1.2 -Pt to follow-up with CHF clinic in June -Consider repeating 2D-echo to reassess EF and valvular function in setting of recent syncopal episode and persistent lightheadedness

## 2015-02-08 NOTE — Progress Notes (Signed)
Case discussed with Dr. Johna Roles soon after the resident saw the patient.  We reviewed the resident's history and exam and pertinent patient test results.  I agree with the assessment, diagnosis and plan of care documented in the resident's note with the following exceptions:  Would hold off on repeat Echo at this time with otherwise lack of symptoms, and explanation for the syncopal event related to continued cocaine use.  Instead, continue to encourage the importance of cocaine cessation through personalized education and offer professional counseling.

## 2015-02-17 ENCOUNTER — Other Ambulatory Visit (HOSPITAL_COMMUNITY): Payer: Self-pay | Admitting: Adult Health

## 2015-02-21 IMAGING — CR DG CHEST 1V PORT
1 series · 1 of 1 positions shown · non-contrast
Comparison: Chest x-ray 09/26/2012.

CLINICAL DATA: Shortness of breath.  Chest pain.

PORTABLE CHEST - 1 VIEW

[AP]
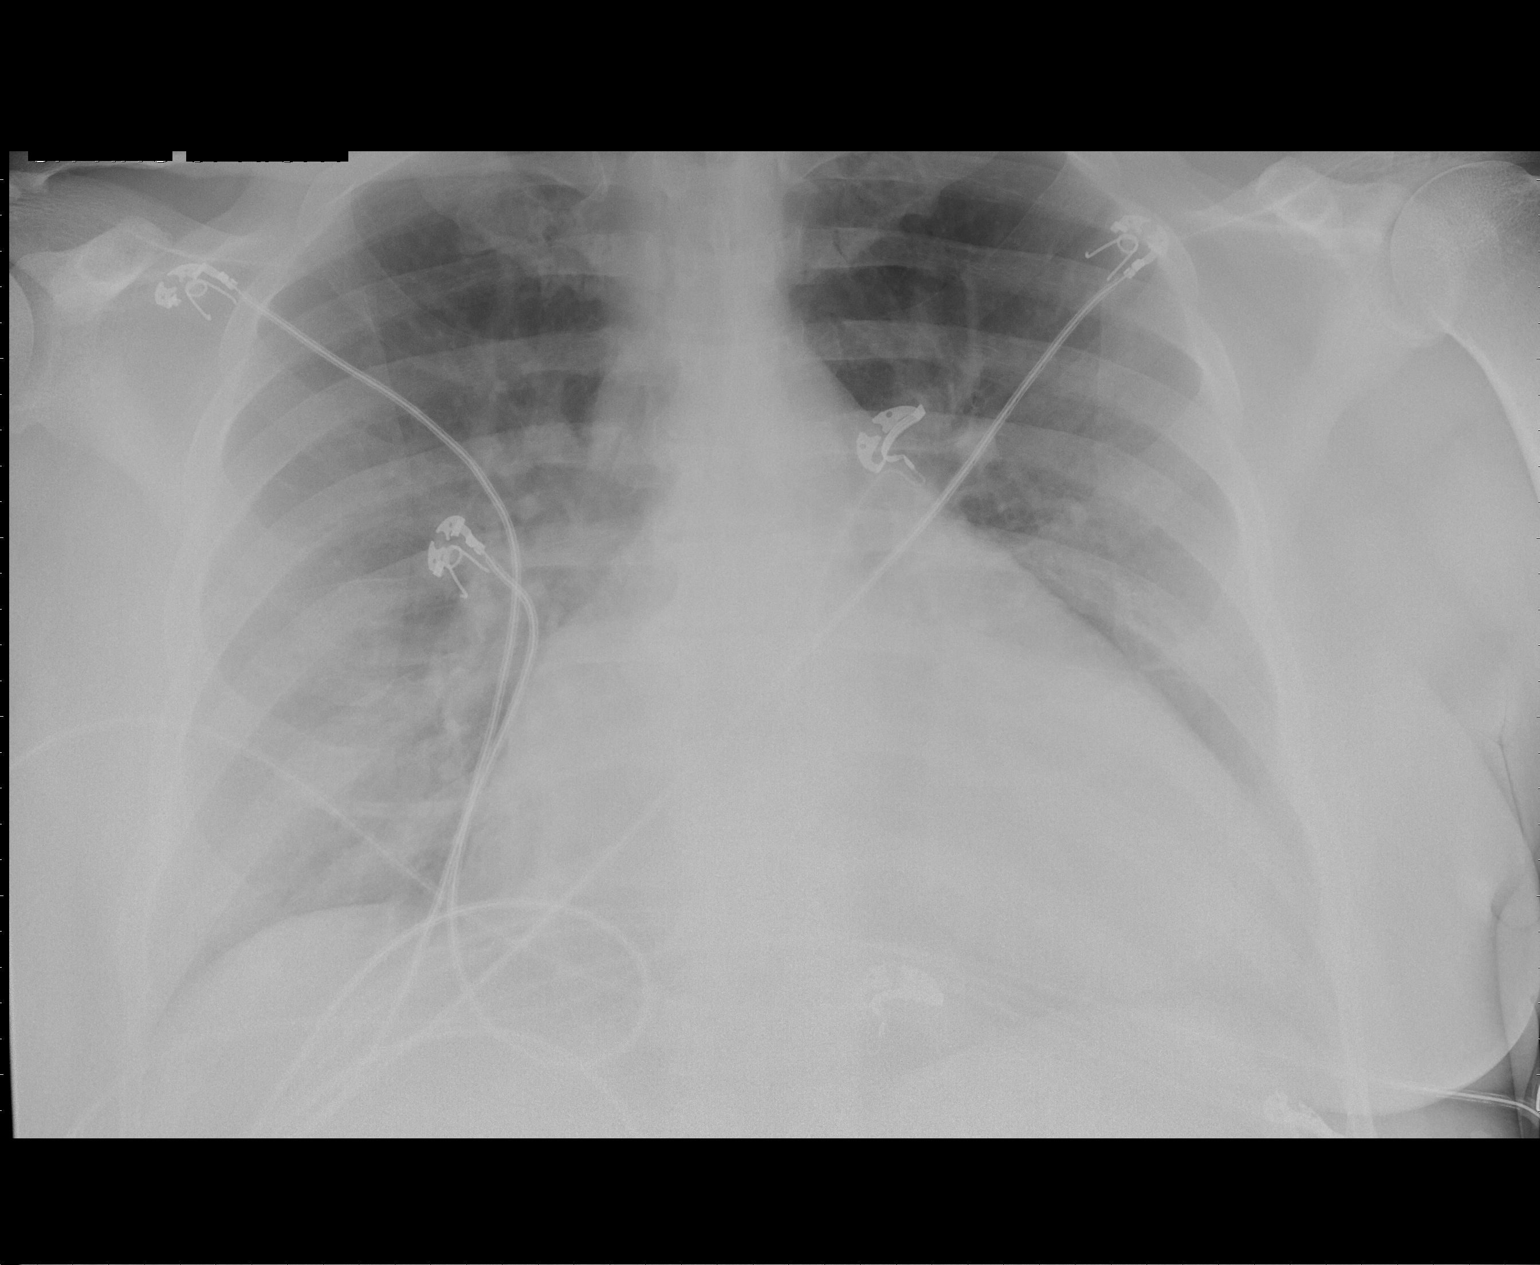

[1 of 1 positions shown; findings below may reference images not displayed]

FINDINGS: Lung volumes are low.  Film is underpenetrated limiting
the sensitivity and specificity of the examination.  With this
limitation in mind there is no definite consolidative airspace
disease or pleural effusions.  Pulmonary venous congestion, without
frank pulmonary edema.  Mild cardiomegaly which appears to have
increased compared to the prior study.  Upper mediastinal contours
are unremarkable.
IMPRESSION: 1.  Cardiomegaly with pulmonary venous congestion, but no frank
pulmonary edema.

## 2015-02-24 ENCOUNTER — Encounter (HOSPITAL_COMMUNITY): Payer: Self-pay

## 2015-02-24 ENCOUNTER — Emergency Department (HOSPITAL_COMMUNITY)
Admission: EM | Admit: 2015-02-24 | Discharge: 2015-02-24 | Disposition: A | Discharge status: Home / Self Care | Payer: Medicaid Other | Attending: Emergency Medicine | Admitting: Emergency Medicine

## 2015-02-24 ENCOUNTER — Emergency Department (HOSPITAL_COMMUNITY): Payer: Medicaid Other

## 2015-02-24 DIAGNOSIS — W231XXA Caught, crushed, jammed, or pinched between stationary objects, initial encounter: Secondary | ICD-10-CM | POA: Insufficient documentation

## 2015-02-24 DIAGNOSIS — I10 Essential (primary) hypertension: Secondary | ICD-10-CM | POA: Insufficient documentation

## 2015-02-24 DIAGNOSIS — M7989 Other specified soft tissue disorders: Secondary | ICD-10-CM

## 2015-02-24 DIAGNOSIS — H578 Other specified disorders of eye and adnexa: Secondary | ICD-10-CM | POA: Diagnosis not present

## 2015-02-24 DIAGNOSIS — S6991XA Unspecified injury of right wrist, hand and finger(s), initial encounter: Secondary | ICD-10-CM | POA: Diagnosis present

## 2015-02-24 DIAGNOSIS — Z9889 Other specified postprocedural states: Secondary | ICD-10-CM | POA: Diagnosis not present

## 2015-02-24 DIAGNOSIS — Z880 Allergy status to penicillin: Secondary | ICD-10-CM | POA: Diagnosis not present

## 2015-02-24 DIAGNOSIS — Y9389 Activity, other specified: Secondary | ICD-10-CM | POA: Diagnosis not present

## 2015-02-24 DIAGNOSIS — L03113 Cellulitis of right upper limb: Secondary | ICD-10-CM | POA: Diagnosis not present

## 2015-02-24 DIAGNOSIS — Z8739 Personal history of other diseases of the musculoskeletal system and connective tissue: Secondary | ICD-10-CM | POA: Diagnosis not present

## 2015-02-24 DIAGNOSIS — Z23 Encounter for immunization: Secondary | ICD-10-CM | POA: Insufficient documentation

## 2015-02-24 DIAGNOSIS — Y9289 Other specified places as the place of occurrence of the external cause: Secondary | ICD-10-CM | POA: Diagnosis not present

## 2015-02-24 DIAGNOSIS — Z79899 Other long term (current) drug therapy: Secondary | ICD-10-CM | POA: Insufficient documentation

## 2015-02-24 DIAGNOSIS — Z95810 Presence of automatic (implantable) cardiac defibrillator: Secondary | ICD-10-CM | POA: Insufficient documentation

## 2015-02-24 DIAGNOSIS — I5022 Chronic systolic (congestive) heart failure: Secondary | ICD-10-CM | POA: Diagnosis not present

## 2015-02-24 DIAGNOSIS — E669 Obesity, unspecified: Secondary | ICD-10-CM | POA: Insufficient documentation

## 2015-02-24 DIAGNOSIS — Z87448 Personal history of other diseases of urinary system: Secondary | ICD-10-CM | POA: Insufficient documentation

## 2015-02-24 DIAGNOSIS — Y998 Other external cause status: Secondary | ICD-10-CM | POA: Diagnosis not present

## 2015-02-24 DIAGNOSIS — Z720 Tobacco use: Secondary | ICD-10-CM | POA: Insufficient documentation

## 2015-02-24 DIAGNOSIS — H0289 Other specified disorders of eyelid: Secondary | ICD-10-CM

## 2015-02-24 LAB — CBG MONITORING, ED: GLUCOSE-CAPILLARY: 191 mg/dL — AB (ref 70–99)

## 2015-02-24 MED ORDER — TETRACAINE HCL 0.5 % OP SOLN
2.0000 [drp] | Freq: Once | OPHTHALMIC | Status: AC
  Administered 2015-02-24: 2 [drp] via OPHTHALMIC
  Filled 2015-02-24: qty 2

## 2015-02-24 MED ORDER — HYDROCODONE-ACETAMINOPHEN 5-325 MG PO TABS: 1.0000 | ORAL_TABLET | ORAL | Status: DC | PRN

## 2015-02-24 MED ORDER — TETANUS-DIPHTH-ACELL PERTUSSIS 5-2.5-18.5 LF-MCG/0.5 IM SUSP
0.5000 mL | Freq: Once | INTRAMUSCULAR | Status: AC
  Administered 2015-02-24: 0.5 mL via INTRAMUSCULAR
  Filled 2015-02-24: qty 0.5

## 2015-02-24 MED ORDER — FLUORESCEIN SODIUM 1 MG OP STRP
1.0000 | ORAL_STRIP | Freq: Once | OPHTHALMIC | Status: AC
  Administered 2015-02-24: 1 via OPHTHALMIC
  Filled 2015-02-24: qty 1

## 2015-02-24 MED ORDER — CLINDAMYCIN HCL 300 MG PO CAPS: 300.0000 mg | ORAL_CAPSULE | Freq: Four times a day (QID) | ORAL | Status: DC

## 2015-02-24 NOTE — ED Notes (Signed)
Pt c/o R hand swelling radiating into forearm; CMS intact; redness noted. Pt reports getting hand smashed between a piece of furniture. Reports swelling has gone done at times then increased over the weekend. Pt also c/o R eye irritation; reports working outside with pollen and feels like something is in his eye. Denies blurred vision.

## 2015-02-24 NOTE — ED Notes (Signed)
Pts CBG 191 reported to RN

## 2015-02-24 NOTE — ED Notes (Signed)
Pt. Reports moving furniture x3 weeks ago and smashed right hand between wall and furniture. States swelling went down but on Saturday was using it a lot and woke up Sunday with hand swollen again.  Pt. Also reports right eye irritation.

## 2015-02-24 NOTE — ED Provider Notes (Signed)
CSN: [id number]     Arrival date & time 02/24/15  1651 History   First MD Initiated Contact with Patient 02/24/15 1710     Chief Complaint  Patient presents with  . Hand Injury  . Eye Pain     (Consider location/radiation/quality/duration/timing/severity/associated sxs/prior Treatment) HPI   Patient with multiple medical problems presents with right hand and forearm swelling, redness, and pain that began yesterday.  States he injured the hand several weeks ago when it got pinned between a couch leg and the wall while he was moving the couch- the knuckles scraped across the edge of the wall and caused an abrasion on his dorsal hand.  He had no pain and only localized swelling after this until yesterday morning when he woke up with a diffusely swollen throbbing right hand with swelling and pain to the mid forearm.  Pain is worse with letting it hang down.  Denies any new injury but he did use it a lot two nights ago cooking on a grill at a family cookout.  Denies fevers, chills, CP, SOB, weakness or numbness of the arm.  He is right handed.   He was hospitalized last month for syncope, chest pain.    Pt also complains of right eye irritation.  Notes he was washing his face when he noticed some soreness in the lateral aspect of his right eye.  He looked and noticed it looked different.  States it feels like there is something in there.  Denies any trauma or known foreign body but does note he has environmental allergies and was rubbing his eyes a lot two days ago.  Pain began yesterday.  Denies redness or discharge from the eye.    Past Medical History  Diagnosis Date  . Hypertension   . Systolic CHF, chronic   . Obesity   . Nonischemic cardiomyopathy     a.  echo 4/06: EF 30%, mild to mod MR, mild RAE, inf HK, lat HK , ant HK;    b.  cath 4/06: no CAD, EF 20-25%  . ED (erectile dysfunction)   . NSVT (nonsustained ventricular tachycardia)     eval by EP in past; no ICD candidate due to NYHA 1  symptoms  . Automatic implantable cardioverter-defibrillator in situ   . Exertional shortness of breath   . Arthritis     ra   Past Surgical History  Procedure Laterality Date  . Multiple extractions with alveoloplasty N/A 01/26/2013    Procedure:  EXTRACION  TOOTH # 19 WITH ALVEOLOPLASTY;  Surgeon: Charlynne Pander, DDS;  Location: [facility] OR;  Service: Oral Surgery;  Laterality: N/A;  . Cardiac defibrillator placement  08/23/2013  . Cardiac catheterization  09/2011; 02/2013; 04/2013  . Left and right heart catheterization with coronary angiogram N/A 09/20/2011    Procedure: LEFT AND RIGHT HEART CATHETERIZATION WITH CORONARY ANGIOGRAM;  Surgeon: Dolores Patty, MD;  Location: [facility] CATH LAB;  Service: Cardiovascular;  Laterality: N/A;  . Right heart catheterization N/A 02/22/2013    Procedure: RIGHT HEART CATH;  Surgeon: Dolores Patty, MD;  Location: [facility] CATH LAB;  Service: Cardiovascular;  Laterality: N/A;  . Right heart catheterization N/A 05/03/2013    Procedure: RIGHT HEART CATH;  Surgeon: Dolores Patty, MD;  Location: [facility] CATH LAB;  Service: Cardiovascular;  Laterality: N/A;  . Implantable cardioverter defibrillator implant N/A 08/23/2013    Procedure: IMPLANTABLE CARDIOVERTER DEFIBRILLATOR IMPLANT;  Surgeon: Duke Salvia, MD;  Location: [facility] CATH LAB;  Service: Cardiovascular;  Laterality: N/A;   Family History  Problem Relation Age of Onset  . Coronary artery disease Mother 74    s/p PCI  . Lung cancer Father   . Diabetes type II Maternal Uncle   . Coronary artery disease Maternal Uncle   . Stroke Neg Hx   . Heart attack Neg Hx    History  Substance Use Topics  . Smoking status: Current Every Day Smoker -- 0.12 packs/day for 8 years    Types: Cigarettes  . Smokeless tobacco: Never Used  . Alcohol Use: 0.0 oz/week    0 Standard drinks or equivalent per week     Comment: 08/23/2013  "rarely have a beer anymore; last beer was a long time ago"    Review of Systems  All other  systems reviewed and are negative.     Allergies  Penicillins  Home Medications   Prior to Admission medications   Medication Sig Start Date End Date Taking? Authorizing Provider  acetaminophen (TYLENOL) 500 MG tablet Take 500 mg by mouth every 6 (six) hours as needed for mild pain.   Yes Historical Provider, MD  amiodarone (PACERONE) 200 MG tablet Take 1 tablet (200 mg total) by mouth daily. 02/05/15  Yes [name], MD  carvedilol (COREG) 12.5 MG tablet Take 1 tablet (12.5 mg total) by mouth 2 (two) times daily with a meal. 12/04/14  Yes Dolores Patty, MD  hydrALAZINE (APRESOLINE) 25 MG tablet TAKE 1 TABLET BY MOUTH THREE TIMES A DAY 02/17/15  Yes Amy D Clegg, NP  isosorbide mononitrate (IMDUR) 30 MG 24 hr tablet Take 1 tablet (30 mg total) by mouth daily. 02/05/15  Yes Marjan Rabbani, MD  lisinopril (PRINIVIL,ZESTRIL) 5 MG tablet Take 1 tablet (5 mg total) by mouth 2 (two) times daily. 12/04/14  Yes Dolores Patty, MD  spironolactone (ALDACTONE) 25 MG tablet Take 0.5 tablets (12.5 mg total) by mouth daily. 02/05/15  Yes [name], MD  torsemide (DEMADEX) 20 MG tablet Take 3 tablets (60 mg total) by mouth 2 (two) times daily. 02/05/15  Yes [name], MD  atorvastatin (LIPITOR) 40 MG tablet Take 1 tablet (40 mg total) by mouth daily at 6 PM. Patient not taking: Reported on 02/24/2015 02/05/15   [name], MD  clotrimazole-betamethasone (LOTRISONE) cream Apply 1 application topically 2 (two) times daily. Patient not taking: Reported on 01/29/2015 09/23/14   Arby Barrette, MD  metFORMIN (GLUCOPHAGE) 500 MG tablet Take 1 tablet (500 mg total) by mouth 2 (two) times daily with a meal. Patient not taking: Reported on 02/24/2015 02/05/15 02/05/16  [name], MD  metolazone (ZAROXOLYN) 2.5 MG tablet Take 1 tablet (2.5 mg total) by mouth as needed. Patient not taking: Reported on 02/24/2015 08/17/13   Amy D Clegg, NP   BP 139/70 mmHg  Pulse 81  Temp(Src) 97.7 F (36.5 C)   Resp 18  Wt 341 lb 3 oz (154.762 kg)  SpO2 95% Physical Exam  Constitutional: He appears well-developed and well-nourished. No distress.  HENT:  Head: Normocephalic and atraumatic.  Eyes: Conjunctivae and EOM are normal. Pupils are equal, round, and reactive to light. Lids are everted and swept, no foreign bodies found. Right eye exhibits no discharge. Left eye exhibits no discharge. Right conjunctiva is not injected. Right conjunctiva has no hemorrhage. No scleral icterus.  Slit lamp exam:      The right eye shows no corneal abrasion, no corneal flare, no corneal ulcer, no foreign body, no hyphema, no hypopyon and no  fluorescein uptake.    Neck: Neck supple.  Pulmonary/Chest: Effort normal.  Musculoskeletal:  Right hand and wrist diffusely swollen, erythematous over dorsal hand, warm.  Sensation intact.    Neurological: He is alert.  Skin: He is not diaphoretic.  Psychiatric: He has a normal mood and affect. His behavior is normal.  Nursing note and vitals reviewed.   ED Course  Procedures (including critical care time) Labs Review Labs Reviewed  CBG MONITORING, ED    Imaging Review Dg Hand Complete Right  02/24/2015   CLINICAL DATA:  Right hand struck furniture while moving 3 weeks ago. Pain and swelling in the right hand for the past 3 weeks.  EXAM: RIGHT HAND - COMPLETE 3+ VIEW  COMPARISON:  None.  FINDINGS: Abnormal soft tissue swelling medially along the hand, and particularly dorsally. Old healed fifth metacarpal fracture. Remaining metacarpals appear intact.  IMPRESSION: 1. Healed fifth metacarpal fracture. The amount of callus formation would not be characteristic for a 21-week-old injury. 2. Prominent soft tissue swelling dorsally and medially in the hand.   Electronically Signed   By: Gaylyn Rong M.D.   On: 02/24/2015 18:58     EKG Interpretation None       Venous duplex negative for clot.   MDM   Final diagnoses:  Cellulitis of right hand  Irritation of  eyelid    Afebrile, nontoxic patient with cellulitis without apparent abscess of right dorsal hand.  Significant edema, erythema, warmth.  Venous duplex negative for clot.  No systemic symptoms.  Pt also with right eyelid irritation, no corneal abrasion, no FB, possible small area of right eyelid irritation vs belpharitis vs early preseptal cellulitis.  Doubt orbital cellulitis.   D/C home with clindamycin, pain medication, ED follow up in 2 days for recheck.  Discussed strict return precautions.  Discussed result, findings, treatment, and follow up  with patient.  Pt given return precautions.  Pt verbalizes understanding and agrees with plan.        Trixie Dredge, PA-C 02/24/15 1923  Mancel Bale, MD 02/25/15 [phone]

## 2015-02-24 NOTE — ED Notes (Addendum)
Pt taken to xray 

## 2015-02-24 NOTE — Progress Notes (Signed)
VASCULAR LAB PRELIMINARY  PRELIMINARY  PRELIMINARY  PRELIMINARY  Right upper extremity venous duplex completed.    Preliminary report:  Right:  No evidence of DVT or superficial thrombosis.    Karaline Buresh, RVT 02/24/2015, 7:04 PM

## 2015-02-24 NOTE — Discharge Instructions (Signed)
Read the information below.  Use the prescribed medication as directed.  Please discuss all new medications with your pharmacist.  Do not take additional tylenol while taking the prescribed pain medication to avoid overdose.  You may return to the Emergency Department at any time for worsening condition or any new symptoms that concern you.   If you develop increased redness, swelling, pus draining from the wound, or fevers greater than 100.4, return to the ER immediately for a recheck.    Cellulitis Cellulitis is an infection of the skin and the tissue beneath it. The infected area is usually red and tender. Cellulitis occurs most often in the arms and lower legs.  CAUSES  Cellulitis is caused by bacteria that enter the skin through cracks or cuts in the skin. The most common types of bacteria that cause cellulitis are staphylococci and streptococci. SIGNS AND SYMPTOMS   Redness and warmth.  Swelling.  Tenderness or pain.  Fever. DIAGNOSIS  Your health care provider can usually determine what is wrong based on a physical exam. Blood tests may also be done. TREATMENT  Treatment usually involves taking an antibiotic medicine. HOME CARE INSTRUCTIONS   Take your antibiotic medicine as directed by your health care provider. Finish the antibiotic even if you start to feel better.  Keep the infected arm or leg elevated to reduce swelling.  Apply a warm cloth to the affected area up to 4 times per day to relieve pain.  Take medicines only as directed by your health care provider.  Keep all follow-up visits as directed by your health care provider. SEEK MEDICAL CARE IF:   You notice red streaks coming from the infected area.  Your red area gets larger or turns dark in color.  Your bone or joint underneath the infected area becomes painful after the skin has healed.  Your infection returns in the same area or another area.  You notice a swollen bump in the infected area.  You develop  new symptoms.  You have a fever. SEEK IMMEDIATE MEDICAL CARE IF:   You feel very sleepy.  You develop vomiting or diarrhea.  You have a general ill feeling (malaise) with muscle aches and pains. MAKE SURE YOU:   Understand these instructions.  Will watch your condition.  Will get help right away if you are not doing well or get worse. Document Released: 08/11/2005 Document Revised: 03/18/2014 Document Reviewed: 01/17/2012 [facility] Patient Information 2015 [place], [place]. This information is not intended to replace advice given to you by your health care provider. Make sure you discuss any questions you have with your health care provider.

## 2015-02-24 NOTE — ED Notes (Signed)
US at bedside

## 2015-02-25 ENCOUNTER — Ambulatory Visit (INDEPENDENT_AMBULATORY_CARE_PROVIDER_SITE_OTHER): Payer: Medicaid Other | Admitting: *Deleted

## 2015-02-25 DIAGNOSIS — I5042 Chronic combined systolic (congestive) and diastolic (congestive) heart failure: Secondary | ICD-10-CM | POA: Diagnosis not present

## 2015-02-25 DIAGNOSIS — I429 Cardiomyopathy, unspecified: Secondary | ICD-10-CM

## 2015-02-25 DIAGNOSIS — I428 Other cardiomyopathies: Secondary | ICD-10-CM

## 2015-02-25 NOTE — Progress Notes (Signed)
Remote ICD transmission.   

## 2015-03-02 LAB — MDC_IDC_ENUM_SESS_TYPE_REMOTE
HighPow Impedance: 56 Ohm
HighPow Impedance: 72 Ohm
Lead Channel Impedance Value: 456 Ohm
Lead Channel Pacing Threshold Amplitude: 0.75 V
Lead Channel Pacing Threshold Pulse Width: 0.4 ms
Lead Channel Sensing Intrinsic Amplitude: 11.9 mV
Lead Channel Setting Pacing Amplitude: 2.5 V
Lead Channel Setting Pacing Pulse Width: 0.4 ms
MDC IDC SET LEADCHNL RV SENSING SENSITIVITY: 0.3 mV
MDC IDC SET ZONE DETECTION INTERVAL: 370 ms
MDC IDC STAT BRADY RV PERCENT PACED: 0.1 %
Zone Setting Detection Interval: 300 ms
Zone Setting Detection Interval: 360 ms

## 2015-03-13 ENCOUNTER — Telehealth: Payer: Self-pay | Admitting: Internal Medicine

## 2015-03-13 NOTE — Telephone Encounter (Signed)
Call to patient to confirm appointment for 03/14/15 at 1:15 lmtcb

## 2015-03-14 ENCOUNTER — Encounter: Payer: Self-pay | Admitting: Internal Medicine

## 2015-03-24 ENCOUNTER — Encounter (HOSPITAL_COMMUNITY): Payer: Self-pay

## 2015-03-24 ENCOUNTER — Encounter: Payer: Self-pay | Admitting: Internal Medicine

## 2015-03-24 ENCOUNTER — Emergency Department (HOSPITAL_COMMUNITY)
Admission: EM | Admit: 2015-03-24 | Discharge: 2015-03-24 | Disposition: A | Discharge status: Home / Self Care | Payer: Medicaid Other | Attending: Emergency Medicine | Admitting: Emergency Medicine

## 2015-03-24 DIAGNOSIS — Z880 Allergy status to penicillin: Secondary | ICD-10-CM | POA: Diagnosis not present

## 2015-03-24 DIAGNOSIS — Z79899 Other long term (current) drug therapy: Secondary | ICD-10-CM | POA: Insufficient documentation

## 2015-03-24 DIAGNOSIS — Z720 Tobacco use: Secondary | ICD-10-CM | POA: Diagnosis not present

## 2015-03-24 DIAGNOSIS — Z87438 Personal history of other diseases of male genital organs: Secondary | ICD-10-CM | POA: Insufficient documentation

## 2015-03-24 DIAGNOSIS — I5022 Chronic systolic (congestive) heart failure: Secondary | ICD-10-CM | POA: Diagnosis not present

## 2015-03-24 DIAGNOSIS — Z95810 Presence of automatic (implantable) cardiac defibrillator: Secondary | ICD-10-CM | POA: Insufficient documentation

## 2015-03-24 DIAGNOSIS — E669 Obesity, unspecified: Secondary | ICD-10-CM | POA: Insufficient documentation

## 2015-03-24 DIAGNOSIS — I10 Essential (primary) hypertension: Secondary | ICD-10-CM | POA: Diagnosis not present

## 2015-03-24 DIAGNOSIS — M79602 Pain in left arm: Secondary | ICD-10-CM | POA: Diagnosis present

## 2015-03-24 DIAGNOSIS — M08822 Other juvenile arthritis, left elbow: Secondary | ICD-10-CM | POA: Insufficient documentation

## 2015-03-24 DIAGNOSIS — Z9889 Other specified postprocedural states: Secondary | ICD-10-CM | POA: Diagnosis not present

## 2015-03-24 DIAGNOSIS — M1310 Monoarthritis, not elsewhere classified, unspecified site: Secondary | ICD-10-CM

## 2015-03-24 DIAGNOSIS — Z792 Long term (current) use of antibiotics: Secondary | ICD-10-CM | POA: Insufficient documentation

## 2015-03-24 MED ORDER — COLCHICINE 0.6 MG PO TABS: 0.6000 mg | ORAL_TABLET | Freq: Two times a day (BID) | ORAL | Status: DC

## 2015-03-24 MED ORDER — COLCHICINE 0.6 MG PO TABS
1.2000 mg | ORAL_TABLET | Freq: Once | ORAL | Status: AC
  Administered 2015-03-24: 1.2 mg via ORAL
  Filled 2015-03-24: qty 2

## 2015-03-24 MED ORDER — HYDROCODONE-ACETAMINOPHEN 5-325 MG PO TABS: 2.0000 | ORAL_TABLET | ORAL | Status: DC | PRN

## 2015-03-24 NOTE — ED Notes (Signed)
Pt here for left arm pain since Friday night. Couldn't press down with the left arm on the bed to help him get out of bed because it hurt. No known injury to the arm that he is aware of.

## 2015-03-24 NOTE — ED Notes (Signed)
Dr. Hyacinth Meeker at the bedside with patient.

## 2015-03-24 NOTE — ED Provider Notes (Signed)
CSN: [id number]     Arrival date & time 03/24/15  0800 History   First MD Initiated Contact with Patient 03/24/15 [phone]     Chief Complaint  Patient presents with  . Arm Pain     (Consider location/radiation/quality/duration/timing/severity/associated sxs/prior Treatment) HPI Comments: The patient is an obese 39 year old male with a history of nonischemic cardiomyopathy who presents to the hospital with a complaint of left arm pain. He states that this started on Friday night, he noted that he was having some difficulty moving the elbow, over the next 2 days the pain has been persistent, gradually worsening and today the pain has been severe. He has significant decreased ability to move the elbow, he denies pain in the shoulder or the wrist, denies fevers chills nausea vomiting rashes insect bites or injury. The symptoms are persistent, he has had Tylenol which initially helped with the pain but did not help today.  Patient is a 39 y.o. male presenting with arm pain. The history is provided by the patient.  Arm Pain    Past Medical History  Diagnosis Date  . Hypertension   . Systolic CHF, chronic   . Obesity   . Nonischemic cardiomyopathy     a.  echo 4/06: EF 30%, mild to mod MR, mild RAE, inf HK, lat HK , ant HK;    b.  cath 4/06: no CAD, EF 20-25%  . ED (erectile dysfunction)   . NSVT (nonsustained ventricular tachycardia)     eval by EP in past; no ICD candidate due to NYHA 1 symptoms  . Automatic implantable cardioverter-defibrillator in situ   . Exertional shortness of breath   . Arthritis     ra   Past Surgical History  Procedure Laterality Date  . Multiple extractions with alveoloplasty N/A 01/26/2013    Procedure:  EXTRACION  TOOTH # 19 WITH ALVEOLOPLASTY;  Surgeon: Charlynne Pander, DDS;  Location: [facility] OR;  Service: Oral Surgery;  Laterality: N/A;  . Cardiac defibrillator placement  08/23/2013  . Cardiac catheterization  09/2011; 02/2013; 04/2013  . Left and right heart  catheterization with coronary angiogram N/A 09/20/2011    Procedure: LEFT AND RIGHT HEART CATHETERIZATION WITH CORONARY ANGIOGRAM;  Surgeon: Dolores Patty, MD;  Location: [facility] CATH LAB;  Service: Cardiovascular;  Laterality: N/A;  . Right heart catheterization N/A 02/22/2013    Procedure: RIGHT HEART CATH;  Surgeon: Dolores Patty, MD;  Location: [facility] CATH LAB;  Service: Cardiovascular;  Laterality: N/A;  . Right heart catheterization N/A 05/03/2013    Procedure: RIGHT HEART CATH;  Surgeon: Dolores Patty, MD;  Location: [facility] CATH LAB;  Service: Cardiovascular;  Laterality: N/A;  . Implantable cardioverter defibrillator implant N/A 08/23/2013    Procedure: IMPLANTABLE CARDIOVERTER DEFIBRILLATOR IMPLANT;  Surgeon: Duke Salvia, MD;  Location: [facility] CATH LAB;  Service: Cardiovascular;  Laterality: N/A;   Family History  Problem Relation Age of Onset  . Coronary artery disease Mother 44    s/p PCI  . Lung cancer Father   . Diabetes type II Maternal Uncle   . Coronary artery disease Maternal Uncle   . Stroke Neg Hx   . Heart attack Neg Hx    History  Substance Use Topics  . Smoking status: Current Every Day Smoker -- 0.12 packs/day for 8 years    Types: Cigarettes  . Smokeless tobacco: Never Used  . Alcohol Use: 0.0 oz/week    0 Standard drinks or equivalent per week     Comment:  08/23/2013  "rarely have a beer anymore; last beer was a long time ago"    Review of Systems  All other systems reviewed and are negative.     Allergies  Penicillins  Home Medications   Prior to Admission medications   Medication Sig Start Date End Date Taking? Authorizing Provider  acetaminophen (TYLENOL) 500 MG tablet Take 500 mg by mouth every 6 (six) hours as needed for mild pain.   Yes Historical Provider, MD  amiodarone (PACERONE) 200 MG tablet Take 1 tablet (200 mg total) by mouth daily. 02/05/15  Yes [name], MD  atorvastatin (LIPITOR) 40 MG tablet Take 1 tablet (40 mg total) by mouth  daily at 6 PM. 02/05/15  Yes Marjan Rabbani, MD  carvedilol (COREG) 12.5 MG tablet Take 1 tablet (12.5 mg total) by mouth 2 (two) times daily with a meal. 12/04/14  Yes Dolores Patty, MD  clotrimazole-betamethasone (LOTRISONE) cream Apply 1 application topically 2 (two) times daily. 09/23/14  Yes Arby Barrette, MD  hydrALAZINE (APRESOLINE) 25 MG tablet TAKE 1 TABLET BY MOUTH THREE TIMES A DAY 02/17/15  Yes Amy D Clegg, NP  lisinopril (PRINIVIL,ZESTRIL) 5 MG tablet Take 1 tablet (5 mg total) by mouth 2 (two) times daily. 12/04/14  Yes Dolores Patty, MD  metFORMIN (GLUCOPHAGE) 500 MG tablet Take 1 tablet (500 mg total) by mouth 2 (two) times daily with a meal. 02/05/15 02/05/16 Yes Marjan Rabbani, MD  clindamycin (CLEOCIN) 300 MG capsule Take 1 capsule (300 mg total) by mouth 4 (four) times daily. X 10 days 02/24/15   Trixie Dredge, PA-C  colchicine 0.6 MG tablet Take 1 tablet (0.6 mg total) by mouth 2 (two) times daily. 03/24/15   Eber Hong, MD  HYDROcodone-acetaminophen (NORCO/VICODIN) 5-325 MG per tablet Take 2 tablets by mouth every 4 (four) hours as needed. 03/24/15   Eber Hong, MD  isosorbide mononitrate (IMDUR) 30 MG 24 hr tablet Take 1 tablet (30 mg total) by mouth daily. Patient not taking: Reported on 03/24/2015 02/05/15   [name], MD  metolazone (ZAROXOLYN) 2.5 MG tablet Take 1 tablet (2.5 mg total) by mouth as needed. Patient not taking: Reported on 02/24/2015 08/17/13   Amy D Clegg, NP  spironolactone (ALDACTONE) 25 MG tablet Take 0.5 tablets (12.5 mg total) by mouth daily. 02/05/15   [name], MD  torsemide (DEMADEX) 20 MG tablet Take 3 tablets (60 mg total) by mouth 2 (two) times daily. Patient not taking: Reported on 03/24/2015 02/05/15   [name], MD   BP 135/76 mmHg  Pulse 74  Temp(Src) 98.2 F (36.8 C) (Oral)  Resp 17  SpO2 99% Physical Exam  Constitutional: He appears well-developed and well-nourished. No distress.  HENT:  Head: Normocephalic and atraumatic.   Mouth/Throat: Oropharynx is clear and moist. No oropharyngeal exudate.  Eyes: Conjunctivae and EOM are normal. Pupils are equal, round, and reactive to light. Right eye exhibits no discharge. Left eye exhibits no discharge. No scleral icterus.  Neck: Normal range of motion. Neck supple. No JVD present. No thyromegaly present.  Cardiovascular: Normal rate, regular rhythm, normal heart sounds and intact distal pulses.  Exam reveals no gallop and no friction rub.   No murmur heard. Pulmonary/Chest: Effort normal and breath sounds normal. No respiratory distress. He has no wheezes. He has no rales.  Abdominal: Soft. Bowel sounds are normal. He exhibits no distension and no mass. There is no tenderness.  Obese nontender abdomen  Musculoskeletal: He exhibits tenderness ( Focal tenderness to palpation  over the elbow joint on the left, decreased range of motion of the elbow secondary to pain, decreased pronation and separation secondary to pain). He exhibits no edema.  Normal range of motion of all joints except for the left elbow as described, compartments are all very soft, no swelling or redness of the left upper extremity  Lymphadenopathy:    He has no cervical adenopathy.  Neurological: He is alert. Coordination normal.  Skin: Skin is warm and dry. No rash noted. No erythema.  Psychiatric: He has a normal mood and affect. His behavior is normal.  Nursing note and vitals reviewed.   ED Course  Procedures (including critical care time) Labs Review Labs Reviewed - No data to display  Imaging Review No results found.   EKG Interpretation None      MDM   Final diagnoses:  Monoarticular arthritis    The patient's elbow exam is somewhat limited secondary to his body habitus however he does have significant decreased range of motion which is likely related to a joint arthropathy. As he has no fevers and normal vital signs I suspect that this is acute gouty arthritis, he will be treated  for the same. I discussed at length with the patient the indications for arthrocentesis, at this time he declines, he should return should his symptoms worsen including redness fevers or severe pain, he is agreeable to this plan. Vital signs normal.  Meds given in ED:  Medications  colchicine tablet 1.2 mg (not administered)    New Prescriptions   COLCHICINE 0.6 MG TABLET    Take 1 tablet (0.6 mg total) by mouth 2 (two) times daily.   HYDROCODONE-ACETAMINOPHEN (NORCO/VICODIN) 5-325 MG PER TABLET    Take 2 tablets by mouth every 4 (four) hours as needed.        Eber Hong, MD 03/24/15 [phone]

## 2015-03-24 NOTE — Discharge Instructions (Signed)
Please call your doctor for a followup appointment within 24-48 hours. When you talk to your doctor please let them know that you were seen in the emergency department and have them acquire all of your records so that they can discuss the findings with you and formulate a treatment plan to fully care for your new and ongoing problems. ° °

## 2015-03-25 ENCOUNTER — Encounter: Payer: Self-pay | Admitting: Cardiology

## 2015-03-27 ENCOUNTER — Encounter: Payer: Self-pay | Admitting: Internal Medicine

## 2015-04-08 ENCOUNTER — Encounter: Payer: Self-pay | Admitting: Cardiology

## 2015-04-10 ENCOUNTER — Telehealth: Payer: Self-pay | Admitting: Internal Medicine

## 2015-04-10 NOTE — Telephone Encounter (Signed)
Call to patient to confirm appointment for 04/11/15 at 1:15 lmtcb ° °

## 2015-04-11 ENCOUNTER — Encounter: Payer: Self-pay | Admitting: Internal Medicine

## 2015-04-18 ENCOUNTER — Encounter: Payer: Self-pay | Admitting: *Deleted

## 2015-04-20 ENCOUNTER — Encounter (HOSPITAL_COMMUNITY): Payer: Self-pay | Admitting: Emergency Medicine

## 2015-04-20 ENCOUNTER — Observation Stay (HOSPITAL_COMMUNITY)
Admission: EM | Admit: 2015-04-20 | Discharge: 2015-04-23 | Disposition: A | Discharge status: Home / Self Care | Payer: Medicaid Other | Attending: Internal Medicine | Admitting: Internal Medicine

## 2015-04-20 ENCOUNTER — Emergency Department (HOSPITAL_COMMUNITY): Payer: Medicaid Other

## 2015-04-20 DIAGNOSIS — R7989 Other specified abnormal findings of blood chemistry: Principal | ICD-10-CM | POA: Insufficient documentation

## 2015-04-20 DIAGNOSIS — M1990 Unspecified osteoarthritis, unspecified site: Secondary | ICD-10-CM | POA: Insufficient documentation

## 2015-04-20 DIAGNOSIS — N529 Male erectile dysfunction, unspecified: Secondary | ICD-10-CM | POA: Diagnosis not present

## 2015-04-20 DIAGNOSIS — E669 Obesity, unspecified: Secondary | ICD-10-CM | POA: Insufficient documentation

## 2015-04-20 DIAGNOSIS — R609 Edema, unspecified: Secondary | ICD-10-CM | POA: Diagnosis not present

## 2015-04-20 DIAGNOSIS — I472 Ventricular tachycardia: Secondary | ICD-10-CM | POA: Insufficient documentation

## 2015-04-20 DIAGNOSIS — R079 Chest pain, unspecified: Secondary | ICD-10-CM

## 2015-04-20 DIAGNOSIS — R0602 Shortness of breath: Secondary | ICD-10-CM | POA: Diagnosis not present

## 2015-04-20 DIAGNOSIS — Z9114 Patient's other noncompliance with medication regimen: Secondary | ICD-10-CM

## 2015-04-20 DIAGNOSIS — R0789 Other chest pain: Secondary | ICD-10-CM | POA: Diagnosis not present

## 2015-04-20 DIAGNOSIS — R778 Other specified abnormalities of plasma proteins: Secondary | ICD-10-CM | POA: Insufficient documentation

## 2015-04-20 DIAGNOSIS — Z720 Tobacco use: Secondary | ICD-10-CM | POA: Insufficient documentation

## 2015-04-20 DIAGNOSIS — I5022 Chronic systolic (congestive) heart failure: Secondary | ICD-10-CM | POA: Diagnosis not present

## 2015-04-20 DIAGNOSIS — R635 Abnormal weight gain: Secondary | ICD-10-CM

## 2015-04-20 DIAGNOSIS — Z9119 Patient's noncompliance with other medical treatment and regimen: Secondary | ICD-10-CM | POA: Diagnosis not present

## 2015-04-20 DIAGNOSIS — I10 Essential (primary) hypertension: Secondary | ICD-10-CM | POA: Insufficient documentation

## 2015-04-20 DIAGNOSIS — Z880 Allergy status to penicillin: Secondary | ICD-10-CM | POA: Insufficient documentation

## 2015-04-20 DIAGNOSIS — I429 Cardiomyopathy, unspecified: Secondary | ICD-10-CM | POA: Diagnosis not present

## 2015-04-20 DIAGNOSIS — Z79899 Other long term (current) drug therapy: Secondary | ICD-10-CM | POA: Insufficient documentation

## 2015-04-20 DIAGNOSIS — N289 Disorder of kidney and ureter, unspecified: Secondary | ICD-10-CM | POA: Diagnosis not present

## 2015-04-20 LAB — BASIC METABOLIC PANEL
Anion gap: 9 (ref 5–15)
BUN: 5 mg/dL — AB (ref 6–20)
CO2: 23 mmol/L (ref 22–32)
CREATININE: 1.27 mg/dL — AB (ref 0.61–1.24)
Calcium: 8.5 mg/dL — ABNORMAL LOW (ref 8.9–10.3)
Chloride: 107 mmol/L (ref 101–111)
GFR calc Af Amer: >60 mL/min (ref >60)
GFR calc non Af Amer: >60 mL/min (ref >60)
GLUCOSE: 218 mg/dL — AB (ref 65–99)
Potassium: 3 mmol/L — ABNORMAL LOW (ref 3.5–5.1)
Sodium: 139 mmol/L (ref 135–145)

## 2015-04-20 LAB — CBC
HEMATOCRIT: 39.9 % (ref 39.0–52.0)
Hemoglobin: 13.6 g/dL (ref 13.0–17.0)
MCH: 29.2 pg (ref 26.0–34.0)
MCHC: 34.1 g/dL (ref 30.0–36.0)
MCV: 85.6 fL (ref 78.0–100.0)
Platelets: 225 10*3/uL (ref 150–400)
RBC: 4.66 MIL/uL (ref 4.22–5.81)
RDW: 14.5 % (ref 11.5–15.5)
WBC: 5.8 10*3/uL (ref 4.0–10.5)

## 2015-04-20 LAB — BRAIN NATRIURETIC PEPTIDE: B NATRIURETIC PEPTIDE 5: 211.3 pg/mL — AB (ref 0.0–100.0)

## 2015-04-20 LAB — I-STAT TROPONIN, ED: Troponin i, poc: 0.12 ng/mL (ref 0.00–0.08)

## 2015-04-20 MED ORDER — METFORMIN HCL 500 MG PO TABS
500.0000 mg | ORAL_TABLET | Freq: Once | ORAL | Status: AC
  Administered 2015-04-20: 500 mg via ORAL
  Filled 2015-04-20: qty 1

## 2015-04-20 MED ORDER — HYDRALAZINE HCL 25 MG PO TABS
25.0000 mg | ORAL_TABLET | Freq: Three times a day (TID) | ORAL | Status: DC
  Administered 2015-04-20 – 2015-04-22 (×6): 25 mg via ORAL
  Filled 2015-04-20 (×7): qty 1

## 2015-04-20 MED ORDER — SPIRONOLACTONE 25 MG PO TABS
12.5000 mg | ORAL_TABLET | Freq: Every day | ORAL | Status: DC
  Administered 2015-04-20 – 2015-04-23 (×4): 12.5 mg via ORAL
  Filled 2015-04-20 (×4): qty 1

## 2015-04-20 MED ORDER — LISINOPRIL 5 MG PO TABS
5.0000 mg | ORAL_TABLET | Freq: Two times a day (BID) | ORAL | Status: DC
  Administered 2015-04-20 – 2015-04-23 (×6): 5 mg via ORAL
  Filled 2015-04-20 (×6): qty 1

## 2015-04-20 MED ORDER — METFORMIN HCL 500 MG PO TABS: 500.0000 mg | ORAL_TABLET | Freq: Two times a day (BID) | ORAL | Status: DC

## 2015-04-20 MED ORDER — TORSEMIDE 20 MG PO TABS
60.0000 mg | ORAL_TABLET | Freq: Two times a day (BID) | ORAL | Status: DC
  Administered 2015-04-20 – 2015-04-23 (×6): 60 mg via ORAL
  Filled 2015-04-20 (×6): qty 3

## 2015-04-20 MED ORDER — AMIODARONE HCL 200 MG PO TABS
200.0000 mg | ORAL_TABLET | Freq: Every day | ORAL | Status: DC
  Administered 2015-04-20 – 2015-04-23 (×4): 200 mg via ORAL
  Filled 2015-04-20 (×4): qty 1

## 2015-04-20 MED ORDER — CARVEDILOL 12.5 MG PO TABS
12.5000 mg | ORAL_TABLET | Freq: Two times a day (BID) | ORAL | Status: DC
  Administered 2015-04-20 – 2015-04-23 (×6): 12.5 mg via ORAL
  Filled 2015-04-20 (×6): qty 1

## 2015-04-20 MED ORDER — ISOSORBIDE MONONITRATE ER 30 MG PO TB24
30.0000 mg | ORAL_TABLET | Freq: Every day | ORAL | Status: DC
  Administered 2015-04-20 – 2015-04-22 (×3): 30 mg via ORAL
  Filled 2015-04-20 (×3): qty 1

## 2015-04-20 NOTE — ED Provider Notes (Signed)
CSN: [id number]     Arrival date & time 04/20/15  1959 History   First MD Initiated Contact with Patient 04/20/15 2050     Chief Complaint  Patient presents with  . Chest Pain     (Consider location/radiation/quality/duration/timing/severity/associated sxs/prior Treatment) HPI Comments: Obese 39 year old male with a history of cardiomyopathy, ICD, CHF, who has not had any of his medications since Thursday.  He has had gradual increase in shortness of breath, chest discomfort and a 15 pound weight gain  Patient is a 39 y.o. male presenting with chest pain. The history is provided by the patient.  Chest Pain Pain location:  Unable to specify Pain quality: dull   Pain radiates to:  Does not radiate Pain radiates to the back: yes   Pain severity:  Moderate Onset quality:  Gradual Duration:  3 days Timing:  Constant Progression:  Worsening Chronicity:  Recurrent Context comment:  Indication noncompliance Relieved by:  Nothing Worsened by:  Exertion Ineffective treatments:  None tried Associated symptoms: shortness of breath   Associated symptoms: no cough, no fever, no nausea and not vomiting     Past Medical History  Diagnosis Date  . Hypertension   . Systolic CHF, chronic   . Obesity   . Nonischemic cardiomyopathy     a.  echo 4/06: EF 30%, mild to mod MR, mild RAE, inf HK, lat HK , ant HK;    b.  cath 4/06: no CAD, EF 20-25%  . ED (erectile dysfunction)   . NSVT (nonsustained ventricular tachycardia)     eval by EP in past; no ICD candidate due to NYHA 1 symptoms  . Automatic implantable cardioverter-defibrillator in situ   . Exertional shortness of breath   . Arthritis     ra   Past Surgical History  Procedure Laterality Date  . Multiple extractions with alveoloplasty N/A 01/26/2013    Procedure:  EXTRACION  TOOTH # 19 WITH ALVEOLOPLASTY;  Surgeon: Charlynne Pander, DDS;  Location: [facility] OR;  Service: Oral Surgery;  Laterality: N/A;  . Cardiac defibrillator placement   08/23/2013  . Cardiac catheterization  09/2011; 02/2013; 04/2013  . Left and right heart catheterization with coronary angiogram N/A 09/20/2011    Procedure: LEFT AND RIGHT HEART CATHETERIZATION WITH CORONARY ANGIOGRAM;  Surgeon: Dolores Patty, MD;  Location: [facility] CATH LAB;  Service: Cardiovascular;  Laterality: N/A;  . Right heart catheterization N/A 02/22/2013    Procedure: RIGHT HEART CATH;  Surgeon: Dolores Patty, MD;  Location: [facility] CATH LAB;  Service: Cardiovascular;  Laterality: N/A;  . Right heart catheterization N/A 05/03/2013    Procedure: RIGHT HEART CATH;  Surgeon: Dolores Patty, MD;  Location: [facility] CATH LAB;  Service: Cardiovascular;  Laterality: N/A;  . Implantable cardioverter defibrillator implant N/A 08/23/2013    Procedure: IMPLANTABLE CARDIOVERTER DEFIBRILLATOR IMPLANT;  Surgeon: Duke Salvia, MD;  Location: [facility] CATH LAB;  Service: Cardiovascular;  Laterality: N/A;   Family History  Problem Relation Age of Onset  . Coronary artery disease Mother 85    s/p PCI  . Lung cancer Father   . Diabetes type II Maternal Uncle   . Coronary artery disease Maternal Uncle   . Stroke Neg Hx   . Heart attack Neg Hx    History  Substance Use Topics  . Smoking status: Current Every Day Smoker -- 0.12 packs/day for 8 years    Types: Cigarettes  . Smokeless tobacco: Never Used  . Alcohol Use: 0.0 oz/week  0 Standard drinks or equivalent per week     Comment: 08/23/2013  "rarely have a beer anymore; last beer was a long time ago"    Review of Systems  Constitutional: Negative for fever and chills.  Respiratory: Positive for chest tightness and shortness of breath. Negative for cough.   Cardiovascular: Positive for chest pain and leg swelling.  Gastrointestinal: Negative for nausea and vomiting.  All other systems reviewed and are negative.     Allergies  Penicillins  Home Medications   Prior to Admission medications   Medication Sig Start Date End Date Taking?  Authorizing Provider  acetaminophen (TYLENOL) 500 MG tablet Take 500 mg by mouth every 6 (six) hours as needed for mild pain.   Yes Historical Provider, MD  amiodarone (PACERONE) 200 MG tablet Take 1 tablet (200 mg total) by mouth daily. 02/05/15  Yes [name], MD  atorvastatin (LIPITOR) 40 MG tablet Take 1 tablet (40 mg total) by mouth daily at 6 PM. 02/05/15  Yes Marjan Rabbani, MD  carvedilol (COREG) 12.5 MG tablet Take 1 tablet (12.5 mg total) by mouth 2 (two) times daily with a meal. 12/04/14  Yes Dolores Patty, MD  hydrALAZINE (APRESOLINE) 25 MG tablet TAKE 1 TABLET BY MOUTH THREE TIMES A DAY 02/17/15  Yes Amy D Clegg, NP  isosorbide mononitrate (IMDUR) 30 MG 24 hr tablet Take 1 tablet (30 mg total) by mouth daily. 02/05/15  Yes Marjan Rabbani, MD  lisinopril (PRINIVIL,ZESTRIL) 5 MG tablet Take 1 tablet (5 mg total) by mouth 2 (two) times daily. 12/04/14  Yes Dolores Patty, MD  metFORMIN (GLUCOPHAGE) 500 MG tablet Take 1 tablet (500 mg total) by mouth 2 (two) times daily with a meal. 02/05/15 02/05/16 Yes Marjan Rabbani, MD  spironolactone (ALDACTONE) 25 MG tablet Take 0.5 tablets (12.5 mg total) by mouth daily. 02/05/15  Yes [name], MD  torsemide (DEMADEX) 20 MG tablet Take 3 tablets (60 mg total) by mouth 2 (two) times daily. 02/05/15  Yes [name], MD  clindamycin (CLEOCIN) 300 MG capsule Take 1 capsule (300 mg total) by mouth 4 (four) times daily. X 10 days 02/24/15   Trixie Dredge, PA-C  clotrimazole-betamethasone (LOTRISONE) cream Apply 1 application topically 2 (two) times daily. 09/23/14   Arby Barrette, MD  colchicine 0.6 MG tablet Take 1 tablet (0.6 mg total) by mouth 2 (two) times daily. 03/24/15   Eber Hong, MD  HYDROcodone-acetaminophen (NORCO/VICODIN) 5-325 MG per tablet Take 2 tablets by mouth every 4 (four) hours as needed. 03/24/15   Eber Hong, MD  metolazone (ZAROXOLYN) 2.5 MG tablet Take 1 tablet (2.5 mg total) by mouth as needed. Patient not taking: Reported  on 02/24/2015 08/17/13   Amy D Clegg, NP   BP 151/89 mmHg  Pulse 75  Temp(Src) 98.5 F (36.9 C) (Oral)  Resp 11  Ht 6\' 4"  (1.93 m)  Wt 345 lb 14.4 oz (156.899 kg)  BMI 42.12 kg/m2  SpO2 100% Physical Exam  Constitutional: He is oriented to person, place, and time. He appears well-developed and well-nourished.  HENT:  Head: Normocephalic.  Eyes: Pupils are equal, round, and reactive to light.  Neck: Normal range of motion.  Cardiovascular: Normal rate and regular rhythm.   Distal heart sounds, most likely due to patient's obesity  Pulmonary/Chest: Effort normal. No respiratory distress.  Abdominal: Soft. He exhibits distension.  Musculoskeletal: He exhibits edema.  Neurological: He is alert and oriented to person, place, and time.  Skin: Skin is warm and  dry.  Nursing note and vitals reviewed.   ED Course  Procedures (including critical care time) Labs Review Labs Reviewed  BASIC METABOLIC PANEL - Abnormal; Notable for the following:    Potassium 3.0 (*)    Glucose, Bld 218 (*)    BUN 5 (*)    Creatinine, Ser 1.27 (*)    Calcium 8.5 (*)    All other components within normal limits  BRAIN NATRIURETIC PEPTIDE - Abnormal; Notable for the following:    B Natriuretic Peptide 211.3 (*)    All other components within normal limits  I-STAT TROPOININ, ED - Abnormal; Notable for the following:    Troponin i, poc 0.12 (*)    All other components within normal limits  CBC    Imaging Review Dg Chest 2 View  04/20/2015   CLINICAL DATA:  Chest pain and shortness of breath since last evening.  EXAM: CHEST  2 VIEW  COMPARISON:  01/29/2015  FINDINGS: The heart is mildly enlarged but stable. The right ventricular pacer wires/ AICD is stable. Mild chronic bronchitic changes and streaky basilar scarring. No infiltrates or effusions. The bony thorax is intact.  IMPRESSION: Stable mild cardiac enlargement and chronic lung changes but no acute pulmonary findings.   Electronically Signed   By: Rudie Meyer M.D.   On: 04/20/2015 20:55     EKG Interpretation   Date/Time:  Sunday April 20 2015 20:06:52 EDT Ventricular Rate:  86 PR Interval:  176 QRS Duration: 98 QT Interval:  436 QTC Calculation: 521 R Axis:   -13 Text Interpretation:  Normal sinus rhythm Possible Left atrial enlargement  Prolonged QT Abnormal ECG since last tracing no significant change  Confirmed by Effie Shy  MD, Mechele Collin ([id number]) on 04/20/2015 8:45:34 PM     Should states he does not feel wellg is afraid that if he gets there.  He would have to come back for worsening symptoms.  I've spoken to the internal medicine physician who will admit him for his renal insufficiency, slightly elevated BNP MDM   Final diagnoses:  Non compliance w medication regimen  Renal insufficiency  Elevated troponin level  Edema  Weight gain         Earley Favor, NP 04/21/15 0006  Mancel Bale, MD 04/21/15 1856

## 2015-04-20 NOTE — ED Notes (Signed)
Dr Effie Shy given a copy of troponin results .12

## 2015-04-20 NOTE — ED Notes (Addendum)
Patient with chest pain since last night with shortness of breath.  Patient also states that he has not urinated for the last two days very much.  Pain is radiating to his back.  Patient does use cocaine, last used 4 days ago.

## 2015-04-21 ENCOUNTER — Observation Stay (HOSPITAL_COMMUNITY): Payer: Medicaid Other

## 2015-04-21 ENCOUNTER — Encounter: Payer: Self-pay | Admitting: Internal Medicine

## 2015-04-21 DIAGNOSIS — R079 Chest pain, unspecified: Secondary | ICD-10-CM | POA: Diagnosis present

## 2015-04-21 DIAGNOSIS — R7989 Other specified abnormal findings of blood chemistry: Secondary | ICD-10-CM | POA: Diagnosis not present

## 2015-04-21 DIAGNOSIS — R0789 Other chest pain: Secondary | ICD-10-CM

## 2015-04-21 DIAGNOSIS — Z9114 Patient's other noncompliance with medication regimen: Secondary | ICD-10-CM | POA: Insufficient documentation

## 2015-04-21 DIAGNOSIS — N289 Disorder of kidney and ureter, unspecified: Secondary | ICD-10-CM | POA: Diagnosis not present

## 2015-04-21 DIAGNOSIS — R609 Edema, unspecified: Secondary | ICD-10-CM | POA: Diagnosis not present

## 2015-04-21 DIAGNOSIS — I5043 Acute on chronic combined systolic (congestive) and diastolic (congestive) heart failure: Secondary | ICD-10-CM | POA: Diagnosis not present

## 2015-04-21 DIAGNOSIS — F1410 Cocaine abuse, uncomplicated: Secondary | ICD-10-CM | POA: Diagnosis not present

## 2015-04-21 LAB — COMPREHENSIVE METABOLIC PANEL
ALT: 41 U/L (ref 17–63)
AST: 34 U/L (ref 15–41)
Albumin: 3.2 g/dL — ABNORMAL LOW (ref 3.5–5.0)
Alkaline Phosphatase: 79 U/L (ref 38–126)
Anion gap: 8 (ref 5–15)
BILIRUBIN TOTAL: 0.7 mg/dL (ref 0.3–1.2)
BUN: 6 mg/dL (ref 6–20)
CO2: 27 mmol/L (ref 22–32)
Calcium: 8 mg/dL — ABNORMAL LOW (ref 8.9–10.3)
Chloride: 105 mmol/L (ref 101–111)
Creatinine, Ser: 1.24 mg/dL (ref 0.61–1.24)
GFR calc Af Amer: >60 mL/min (ref >60)
Glucose, Bld: 137 mg/dL — ABNORMAL HIGH (ref 65–99)
POTASSIUM: 2.9 mmol/L — AB (ref 3.5–5.1)
Sodium: 140 mmol/L (ref 135–145)
Total Protein: 6.1 g/dL — ABNORMAL LOW (ref 6.5–8.1)

## 2015-04-21 LAB — CBC
HCT: 38.4 % — ABNORMAL LOW (ref 39.0–52.0)
Hemoglobin: 13 g/dL (ref 13.0–17.0)
MCH: 29.2 pg (ref 26.0–34.0)
MCHC: 33.9 g/dL (ref 30.0–36.0)
MCV: 86.3 fL (ref 78.0–100.0)
Platelets: 204 10*3/uL (ref 150–400)
RBC: 4.45 MIL/uL (ref 4.22–5.81)
RDW: 14.5 % (ref 11.5–15.5)
WBC: 5.8 10*3/uL (ref 4.0–10.5)

## 2015-04-21 LAB — MRSA PCR SCREENING: MRSA BY PCR: NEGATIVE

## 2015-04-21 LAB — GLUCOSE, CAPILLARY
GLUCOSE-CAPILLARY: 155 mg/dL — AB (ref 65–99)
Glucose-Capillary: 136 mg/dL — ABNORMAL HIGH (ref 65–99)
Glucose-Capillary: 170 mg/dL — ABNORMAL HIGH (ref 65–99)
Glucose-Capillary: 169 mg/dL — ABNORMAL HIGH (ref 65–99)

## 2015-04-21 LAB — TROPONIN I
TROPONIN I: 0.28 ng/mL — AB (ref <0.031)
TROPONIN I: 0.26 ng/mL — AB (ref <0.031)
TROPONIN I: 0.27 ng/mL — AB (ref <0.031)

## 2015-04-21 MED ORDER — SODIUM CHLORIDE 0.9 % IV SOLN: 250.0000 mL | INTRAVENOUS | Status: DC | PRN

## 2015-04-21 MED ORDER — MORPHINE SULFATE 2 MG/ML IJ SOLN
0.5000 mg | INTRAMUSCULAR | Status: DC | PRN
Start: 2015-04-21 — End: 2015-04-23
  Administered 2015-04-21: 0.5 mg via INTRAVENOUS
  Filled 2015-04-21: qty 1

## 2015-04-21 MED ORDER — ENOXAPARIN SODIUM 80 MG/0.8ML ~~LOC~~ SOLN
75.0000 mg | SUBCUTANEOUS | Status: DC
  Administered 2015-04-21 – 2015-04-22 (×2): 75 mg via SUBCUTANEOUS
  Filled 2015-04-21 (×2): qty 0.8

## 2015-04-21 MED ORDER — POTASSIUM CHLORIDE CRYS ER 10 MEQ PO TBCR
EXTENDED_RELEASE_TABLET | ORAL | Status: AC
  Administered 2015-04-21: 10 meq
  Filled 2015-04-21: qty 4

## 2015-04-21 MED ORDER — POTASSIUM CHLORIDE CRYS ER 20 MEQ PO TBCR
40.0000 meq | EXTENDED_RELEASE_TABLET | Freq: Once | ORAL | Status: AC
  Administered 2015-04-21: 40 meq via ORAL
  Filled 2015-04-21: qty 2

## 2015-04-21 MED ORDER — ASPIRIN EC 325 MG PO TBEC: 325.0000 mg | DELAYED_RELEASE_TABLET | Freq: Every day | ORAL | Status: DC

## 2015-04-21 MED ORDER — ATORVASTATIN CALCIUM 40 MG PO TABS
40.0000 mg | ORAL_TABLET | Freq: Every day | ORAL | Status: DC
  Administered 2015-04-21 – 2015-04-22 (×2): 40 mg via ORAL
  Filled 2015-04-21 (×2): qty 1

## 2015-04-21 MED ORDER — REGADENOSON 0.4 MG/5ML IV SOLN
INTRAVENOUS | Status: AC
  Filled 2015-04-21: qty 5

## 2015-04-21 MED ORDER — ASPIRIN EC 81 MG PO TBEC
81.0000 mg | DELAYED_RELEASE_TABLET | Freq: Every day | ORAL | Status: DC
  Administered 2015-04-22 – 2015-04-23 (×2): 81 mg via ORAL
  Filled 2015-04-21 (×3): qty 1

## 2015-04-21 MED ORDER — ACETAMINOPHEN 325 MG PO TABS
650.0000 mg | ORAL_TABLET | Freq: Four times a day (QID) | ORAL | Status: DC | PRN
  Administered 2015-04-21 – 2015-04-22 (×3): 650 mg via ORAL
  Filled 2015-04-21 (×3): qty 2

## 2015-04-21 MED ORDER — COLCHICINE 0.6 MG PO TABS
0.6000 mg | ORAL_TABLET | Freq: Two times a day (BID) | ORAL | Status: DC
  Administered 2015-04-21 – 2015-04-23 (×6): 0.6 mg via ORAL
  Filled 2015-04-21 (×6): qty 1

## 2015-04-21 MED ORDER — SODIUM CHLORIDE 0.9 % IJ SOLN
3.0000 mL | Freq: Two times a day (BID) | INTRAMUSCULAR | Status: DC
  Administered 2015-04-21 – 2015-04-23 (×2): 3 mL via INTRAVENOUS

## 2015-04-21 MED ORDER — REGADENOSON 0.4 MG/5ML IV SOLN
0.4000 mg | Freq: Once | INTRAVENOUS | Status: AC
  Administered 2015-04-21: 0.4 mg via INTRAVENOUS

## 2015-04-21 MED ORDER — INSULIN ASPART 100 UNIT/ML ~~LOC~~ SOLN
0.0000 [IU] | Freq: Three times a day (TID) | SUBCUTANEOUS | Status: DC
  Administered 2015-04-21 (×2): 2 [IU] via SUBCUTANEOUS
  Administered 2015-04-22: 3 [IU] via SUBCUTANEOUS
  Administered 2015-04-22: 1 [IU] via SUBCUTANEOUS

## 2015-04-21 MED ORDER — SODIUM CHLORIDE 0.9 % IJ SOLN: 3.0000 mL | INTRAMUSCULAR | Status: DC | PRN

## 2015-04-21 MED ORDER — POTASSIUM CHLORIDE CRYS ER 20 MEQ PO TBCR
20.0000 meq | EXTENDED_RELEASE_TABLET | Freq: Once | ORAL | Status: AC
  Administered 2015-04-21: 20 meq via ORAL
  Filled 2015-04-21: qty 1

## 2015-04-21 MED ORDER — SODIUM CHLORIDE 0.9 % IJ SOLN
3.0000 mL | Freq: Two times a day (BID) | INTRAMUSCULAR | Status: DC
  Administered 2015-04-21 – 2015-04-22 (×5): 3 mL via INTRAVENOUS

## 2015-04-21 NOTE — Progress Notes (Signed)
Part 1 (stress portion) of 2 day lexiscan completed without complication. 2 day test because patient's morbid obesity. Pending resting image tomorrow 6/7, final result by [facility] reader tomorrow afternoon.  I have informed Ward Givens tomorrow's on-call APP to followup on result.   Ramond Dial PA Pager: [phone]

## 2015-04-21 NOTE — H&P (Signed)
Douglas [name] is an 39 y.o. male.     Chief Complaint: chest pain HPI: 39 yo male with htn, nonischemic cardiomyopathy apparently c/o chest pain since yesterday.  sscp "dull" without radiation.  Pt states this has been constant since yesterday.  Pt notes weight gain and that he has not been taking his medications.   Pt came to ED for evaluation and requested by ED to admit for ches tpain in this patient who was cocaine positive.   Past Medical History  Diagnosis Date  . Hypertension   . Systolic CHF, chronic   . Obesity   . Nonischemic cardiomyopathy     a.  echo 4/06: EF 30%, mild to mod MR, mild RAE, inf HK, lat HK , ant HK;    b.  cath 4/06: no CAD, EF 20-25%  . ED (erectile dysfunction)   . NSVT (nonsustained ventricular tachycardia)     eval by EP in past; no ICD candidate due to NYHA 1 symptoms  . Automatic implantable cardioverter-defibrillator in situ   . Exertional shortness of breath   . Arthritis     ra    Past Surgical History  Procedure Laterality Date  . Multiple extractions with alveoloplasty N/A 01/26/2013    Procedure:  EXTRACION  TOOTH # 19 WITH ALVEOLOPLASTY;  Surgeon: Charlynne Pander, DDS;  Location: [facility] OR;  Service: Oral Surgery;  Laterality: N/A;  . Cardiac defibrillator placement  08/23/2013  . Cardiac catheterization  09/2011; 02/2013; 04/2013  . Left and right heart catheterization with coronary angiogram N/A 09/20/2011    Procedure: LEFT AND RIGHT HEART CATHETERIZATION WITH CORONARY ANGIOGRAM;  Surgeon: Dolores Patty, MD;  Location: [facility] CATH LAB;  Service: Cardiovascular;  Laterality: N/A;  . Right heart catheterization N/A 02/22/2013    Procedure: RIGHT HEART CATH;  Surgeon: Dolores Patty, MD;  Location: [facility] CATH LAB;  Service: Cardiovascular;  Laterality: N/A;  . Right heart catheterization N/A 05/03/2013    Procedure: RIGHT HEART CATH;  Surgeon: Dolores Patty, MD;  Location: [facility] CATH LAB;  Service: Cardiovascular;  Laterality: N/A;  .  Implantable cardioverter defibrillator implant N/A 08/23/2013    Procedure: IMPLANTABLE CARDIOVERTER DEFIBRILLATOR IMPLANT;  Surgeon: Duke Salvia, MD;  Location: [facility] CATH LAB;  Service: Cardiovascular;  Laterality: N/A;    Family History  Problem Relation Age of Onset  . Coronary artery disease Mother 35    s/p PCI  . Lung cancer Father   . Diabetes type II Maternal Uncle   . Coronary artery disease Maternal Uncle   . Stroke Neg Hx   . Heart attack Neg Hx    Social History:  reports that he has been smoking Cigarettes.  He has a .96 pack-year smoking history. He has never used smokeless tobacco. He reports that he drinks alcohol. He reports that he uses illicit drugs (Cocaine).  Allergies:  Allergies  Allergen Reactions  . Penicillins Other (See Comments)    Unknown childhood reaction   Medications reviewed   Results for orders placed or performed during the hospital encounter of 04/20/15 (from the past 48 hour(s))  Basic metabolic panel     Status: Abnormal   Collection Time: 04/20/15  8:15 PM  Result Value Ref Range   Sodium 139 135 - 145 mmol/L   Potassium 3.0 (L) 3.5 - 5.1 mmol/L   Chloride 107 101 - 111 mmol/L   CO2 23 22 - 32 mmol/L   Glucose, Bld 218 (H) 65 - 99 mg/dL  BUN 5 (L) 6 - 20 mg/dL   Creatinine, Ser 1.27 (H) 0.61 - 1.24 mg/dL   Calcium 8.5 (L) 8.9 - 10.3 mg/dL   GFR calc non Af Amer >60 >60 mL/min   GFR calc Af Amer >60 >60 mL/min    Comment: (NOTE) The eGFR has been calculated using the CKD EPI equation. This calculation has not been validated in all clinical situations. eGFR's persistently <60 mL/min signify possible Chronic Kidney Disease.    Anion gap 9 5 - 15  BNP (order ONLY if patient complains of dyspnea/SOB AND you have documented it for THIS visit)     Status: Abnormal   Collection Time: 04/20/15  8:15 PM  Result Value Ref Range   B Natriuretic Peptide 211.3 (H) 0.0 - 100.0 pg/mL  CBC     Status: None   Collection Time: 04/20/15  8:15 PM   Result Value Ref Range   WBC 5.8 4.0 - 10.5 K/uL   RBC 4.66 4.22 - 5.81 MIL/uL   Hemoglobin 13.6 13.0 - 17.0 g/dL   HCT 39.9 39.0 - 52.0 %   MCV 85.6 78.0 - 100.0 fL   MCH 29.2 26.0 - 34.0 pg   MCHC 34.1 30.0 - 36.0 g/dL   RDW 14.5 11.5 - 15.5 %   Platelets 225 150 - 400 K/uL  I-stat troponin, ED  (not at [facility], [facility])     Status: Abnormal   Collection Time: 04/20/15  8:29 PM  Result Value Ref Range   Troponin i, poc 0.12 (HH) 0.00 - 0.08 ng/mL   Comment NOTIFIED PHYSICIAN    Comment 3            Comment: Due to the release kinetics of cTnI, a negative result within the first hours of the onset of symptoms does not rule out myocardial infarction with certainty. If myocardial infarction is still suspected, repeat the test at appropriate intervals.    Dg Chest 2 View  04/20/2015   CLINICAL DATA:  Chest pain and shortness of breath since last evening.  EXAM: CHEST  2 VIEW  COMPARISON:  01/29/2015  FINDINGS: The heart is mildly enlarged but stable. The right ventricular pacer wires/ AICD is stable. Mild chronic bronchitic changes and streaky basilar scarring. No infiltrates or effusions. The bony thorax is intact.  IMPRESSION: Stable mild cardiac enlargement and chronic lung changes but no acute pulmonary findings.   Electronically Signed   By: Marijo Sanes M.D.   On: 04/20/2015 20:55    Review of Systems  Constitutional: Negative.   HENT: Negative.   Eyes: Negative.   Respiratory: Negative for cough, hemoptysis, sputum production, shortness of breath and wheezing.   Cardiovascular: Positive for chest pain. Negative for palpitations, orthopnea, claudication, leg swelling and PND.  Gastrointestinal: Negative.   Genitourinary: Negative.   Musculoskeletal: Negative.   Skin: Negative.   Neurological: Negative.   Endo/Heme/Allergies: Negative.     Blood pressure 151/89, pulse 75, temperature 98.5 F (36.9 C), temperature source Oral, resp. rate 11, height $RemoveBe'6\' 4"'[id number]$  (1.93 m), weight  156.899 kg (345 lb 14.4 oz), SpO2 100 %. Physical Exam  Constitutional: He is oriented to person, place, and time. He appears well-developed and well-nourished.  HENT:  Head: Normocephalic and atraumatic.  Mouth/Throat: No oropharyngeal exudate.  Eyes: Conjunctivae and EOM are normal. Pupils are equal, round, and reactive to light. No scleral icterus.  Neck: Normal range of motion. Neck supple. No JVD present. No tracheal deviation present. No thyromegaly present.  Cardiovascular:  Normal rate and regular rhythm.  Exam reveals no gallop and no friction rub.   No murmur heard. Respiratory: Effort normal and breath sounds normal. No respiratory distress. He has no wheezes. He has no rales.  GI: Soft. Bowel sounds are normal. He exhibits no distension. There is no tenderness. There is no rebound and no guarding.  Musculoskeletal: Normal range of motion. He exhibits no edema or tenderness.  Lymphadenopathy:    He has no cervical adenopathy.  Neurological: He is alert and oriented to person, place, and time. He has normal reflexes. He displays normal reflexes. No cranial nerve deficit. He exhibits normal muscle tone. Coordination abnormal.  Skin: Skin is warm and dry. No rash noted. No erythema. No pallor.  Psychiatric: He has a normal mood and affect. His behavior is normal. Judgment and thought content normal.     Assessment/Plan Chest pain Tele Trop i q6h x3 Check echo Npo after mn Nuclear stress test in am  Hypokalemia Replete Check cmp in am  Renal insufficiency Check cmp in am  Dm2 fsbs q4h, iss  DVT prophylaxis:  Scd, lovenox  Khalidah Herbold 04/21/2015, 12:01 AM

## 2015-04-21 NOTE — Consult Note (Signed)
CONSULTATION NOTE  Reason for Consult: Chest pain  Requesting Physician: Dr. Algis Liming  Cardiologist: Dr. Haroldine Laws (CHF)/ Dr. Caryl Comes (Medtronic AICD)  HPI: This is a 39 y.o. male with a past medical history significant for hypertension, dyslipidemia, obesity and nonischemic cardio myopathy with EF of 15-25% over the past several years. It sounds like he's had variable compliance with medications. He is status post Medtronic AICD and is followed by Dr. Caryl Comes. He had heart catheterization 2006 which showed normal coronaries. At that time EF was 20-25% however dropped to 15% in 2014. He was admitted in April 2014 for low output heart failure. At the time he had nonsustained VT and was started on amiodarone and subsequently had ICD placement. In January 2016 he was admitted with VT and had 2 ICD shocks. Unfortunately has ongoing substance abuse with cocaine. In March 2016 he presented with syncope and elevated troponin. At the time he was also using cocaine and his device was altered, adding SVT discriminators and ATP limit of 250 ms.  He now presents with substernal chest pain which is dull and nonradiating. He reports it's been constant. He has had weight gain and reports he has not been taking his medications- he has also been short of breath. He reports ongoing cocaine use, last about 3 days ago. BNP is elevated at 211. Troponin was mildly positive at 0.12 (POC) and 0.28 troponin I. He denies any device shocks. He is ordered for a stress test today. Cardiology is asked to consult regarding management and elevated troponin.  PMHx:  Past Medical History  Diagnosis Date  . Hypertension   . Systolic CHF, chronic   . Obesity   . Nonischemic cardiomyopathy     a.  echo 4/06: EF 30%, mild to mod MR, mild RAE, inf HK, lat HK , ant HK;    b.  cath 4/06: no CAD, EF 20-25%  . ED (erectile dysfunction)   . NSVT (nonsustained ventricular tachycardia)     eval by EP in past; no ICD candidate due to  NYHA 1 symptoms  . Automatic implantable cardioverter-defibrillator in situ   . Exertional shortness of breath   . Arthritis     ra   Past Surgical History  Procedure Laterality Date  . Multiple extractions with alveoloplasty N/A 01/26/2013    Procedure:  EXTRACION  TOOTH # 19 WITH ALVEOLOPLASTY;  Surgeon: Lenn Cal, DDS;  Location: [facility];  Service: Oral Surgery;  Laterality: N/A;  . Cardiac defibrillator placement  08/23/2013  . Cardiac catheterization  09/2011; 02/2013; 04/2013  . Left and right heart catheterization with coronary angiogram N/A 09/20/2011    Procedure: LEFT AND RIGHT HEART CATHETERIZATION WITH CORONARY ANGIOGRAM;  Surgeon: Jolaine Artist, MD;  Location: [facility] CATH LAB;  Service: Cardiovascular;  Laterality: N/A;  . Right heart catheterization N/A 02/22/2013    Procedure: RIGHT HEART CATH;  Surgeon: Jolaine Artist, MD;  Location: [facility] CATH LAB;  Service: Cardiovascular;  Laterality: N/A;  . Right heart catheterization N/A 05/03/2013    Procedure: RIGHT HEART CATH;  Surgeon: Jolaine Artist, MD;  Location: [facility] CATH LAB;  Service: Cardiovascular;  Laterality: N/A;  . Implantable cardioverter defibrillator implant N/A 08/23/2013    Procedure: IMPLANTABLE CARDIOVERTER DEFIBRILLATOR IMPLANT;  Surgeon: Deboraha Sprang, MD;  Location: [facility] CATH LAB;  Service: Cardiovascular;  Laterality: N/A;    FAMHx: Family History  Problem Relation Age of Onset  . Coronary artery disease Mother 58    s/p PCI  .  Lung cancer Father   . Diabetes type II Maternal Uncle   . Coronary artery disease Maternal Uncle   . Stroke Neg Hx   . Heart attack Neg Hx     SOCHx:  reports that he has been smoking Cigarettes.  He has a .96 pack-year smoking history. He has never used smokeless tobacco. He reports that he drinks alcohol. He reports that he uses illicit drugs (Cocaine).  ALLERGIES: Allergies  Allergen Reactions  . Penicillins Other (See Comments)    Unknown childhood reaction      ROS: A comprehensive review of systems was negative except for: Respiratory: positive for dyspnea on exertion Cardiovascular: positive for chest pain  HOME MEDICATIONS: Prescriptions prior to admission  Medication Sig Dispense Refill Last Dose  . acetaminophen (TYLENOL) 500 MG tablet Take 500 mg by mouth every 6 (six) hours as needed for mild pain.   Past Month at Unknown time  . amiodarone (PACERONE) 200 MG tablet Take 1 tablet (200 mg total) by mouth daily. 30 tablet 5 Past Week at Unknown time  . atorvastatin (LIPITOR) 40 MG tablet Take 1 tablet (40 mg total) by mouth daily at 6 PM. 30 tablet 5 Past Week at Unknown time  . carvedilol (COREG) 12.5 MG tablet Take 1 tablet (12.5 mg total) by mouth 2 (two) times daily with a meal. 60 tablet 3 Past Week at Unknown time  . hydrALAZINE (APRESOLINE) 25 MG tablet TAKE 1 TABLET BY MOUTH THREE TIMES A DAY 102 tablet 2 04/20/2015 at Unknown time  . isosorbide mononitrate (IMDUR) 30 MG 24 hr tablet Take 1 tablet (30 mg total) by mouth daily. 30 tablet 5 UNKNOWN  . lisinopril (PRINIVIL,ZESTRIL) 5 MG tablet Take 1 tablet (5 mg total) by mouth 2 (two) times daily. 60 tablet 3 Past Week at Unknown time  . metFORMIN (GLUCOPHAGE) 500 MG tablet Take 1 tablet (500 mg total) by mouth 2 (two) times daily with a meal. 60 tablet 5 Past Week at Unknown time  . spironolactone (ALDACTONE) 25 MG tablet Take 0.5 tablets (12.5 mg total) by mouth daily. 30 tablet 5 Past Week at Unknown time  . torsemide (DEMADEX) 20 MG tablet Take 3 tablets (60 mg total) by mouth 2 (two) times daily. 180 tablet 5 Past Week at Unknown time  . clindamycin (CLEOCIN) 300 MG capsule Take 1 capsule (300 mg total) by mouth 4 (four) times daily. X 10 days 40 capsule 0   . clotrimazole-betamethasone (LOTRISONE) cream Apply 1 application topically 2 (two) times daily. 45 g 0 Past Month at Unknown time  . colchicine 0.6 MG tablet Take 1 tablet (0.6 mg total) by mouth 2 (two) times daily. 8 tablet 0    . HYDROcodone-acetaminophen (NORCO/VICODIN) 5-325 MG per tablet Take 2 tablets by mouth every 4 (four) hours as needed. 10 tablet 0   . metolazone (ZAROXOLYN) 2.5 MG tablet Take 1 tablet (2.5 mg total) by mouth as needed. (Patient not taking: Reported on 02/24/2015) 8 tablet 3 10-2014    HOSPITAL MEDICATIONS: Prior to Admission:  Prescriptions prior to admission  Medication Sig Dispense Refill Last Dose  . acetaminophen (TYLENOL) 500 MG tablet Take 500 mg by mouth every 6 (six) hours as needed for mild pain.   Past Month at Unknown time  . amiodarone (PACERONE) 200 MG tablet Take 1 tablet (200 mg total) by mouth daily. 30 tablet 5 Past Week at Unknown time  . atorvastatin (LIPITOR) 40 MG tablet Take 1 tablet (40 mg total) by  mouth daily at 6 PM. 30 tablet 5 Past Week at Unknown time  . carvedilol (COREG) 12.5 MG tablet Take 1 tablet (12.5 mg total) by mouth 2 (two) times daily with a meal. 60 tablet 3 Past Week at Unknown time  . hydrALAZINE (APRESOLINE) 25 MG tablet TAKE 1 TABLET BY MOUTH THREE TIMES A DAY 102 tablet 2 04/20/2015 at Unknown time  . isosorbide mononitrate (IMDUR) 30 MG 24 hr tablet Take 1 tablet (30 mg total) by mouth daily. 30 tablet 5 UNKNOWN  . lisinopril (PRINIVIL,ZESTRIL) 5 MG tablet Take 1 tablet (5 mg total) by mouth 2 (two) times daily. 60 tablet 3 Past Week at Unknown time  . metFORMIN (GLUCOPHAGE) 500 MG tablet Take 1 tablet (500 mg total) by mouth 2 (two) times daily with a meal. 60 tablet 5 Past Week at Unknown time  . spironolactone (ALDACTONE) 25 MG tablet Take 0.5 tablets (12.5 mg total) by mouth daily. 30 tablet 5 Past Week at Unknown time  . torsemide (DEMADEX) 20 MG tablet Take 3 tablets (60 mg total) by mouth 2 (two) times daily. 180 tablet 5 Past Week at Unknown time  . clindamycin (CLEOCIN) 300 MG capsule Take 1 capsule (300 mg total) by mouth 4 (four) times daily. X 10 days 40 capsule 0   . clotrimazole-betamethasone (LOTRISONE) cream Apply 1 application  topically 2 (two) times daily. 45 g 0 Past Month at Unknown time  . colchicine 0.6 MG tablet Take 1 tablet (0.6 mg total) by mouth 2 (two) times daily. 8 tablet 0   . HYDROcodone-acetaminophen (NORCO/VICODIN) 5-325 MG per tablet Take 2 tablets by mouth every 4 (four) hours as needed. 10 tablet 0   . metolazone (ZAROXOLYN) 2.5 MG tablet Take 1 tablet (2.5 mg total) by mouth as needed. (Patient not taking: Reported on 02/24/2015) 8 tablet 3 10-2014    VITALS: Blood pressure 120/55, pulse 78, temperature 98.8 F (37.1 C), temperature source Oral, resp. rate 20, height _0  (1.93 m), weight 329 lb 9.6 oz (149.506 kg), SpO2 96 %.  PHYSICAL EXAM: General appearance: alert, no distress and morbidly obese Neck: JVD - 3 cm above sternal notch and no carotid bruit Lungs: diminished breath sounds bibasilar Heart: regular rate and rhythm, S1, S2 normal and systolic murmur: early systolic 2/6, blowing at apex Abdomen: soft, non-tender; bowel sounds normal; no masses,  no organomegaly and obese, no fluid wave Extremities: edema AICD in left upper chest and no pocket swelling Pulses: 1+ symmetric Skin: Skin color, texture, turgor normal. No rashes or lesions Neurologic: Grossly normal Psych: Pleasant  LABS: Results for orders placed or performed during the hospital encounter of 04/20/15 (from the past 48 hour(s))  Basic metabolic panel     Status: Abnormal   Collection Time: 04/20/15  8:15 PM  Result Value Ref Range   Sodium 139 135 - 145 mmol/L   Potassium 3.0 (L) 3.5 - 5.1 mmol/L   Chloride 107 101 - 111 mmol/L   CO2 23 22 - 32 mmol/L   Glucose, Bld 218 (H) 65 - 99 mg/dL   BUN 5 (L) 6 - 20 mg/dL   Creatinine, Ser 1.27 (H) 0.61 - 1.24 mg/dL   Calcium 8.5 (L) 8.9 - 10.3 mg/dL   GFR calc non Af Amer >60 >60 mL/min   GFR calc Af Amer >60 >60 mL/min    Comment: (NOTE) The eGFR has been calculated using the CKD EPI equation. This calculation has not been validated in all clinical  situations.  eGFR's persistently <60 mL/min signify possible Chronic Kidney Disease.    Anion gap 9 5 - 15  BNP (order ONLY if patient complains of dyspnea/SOB AND you have documented it for THIS visit)     Status: Abnormal   Collection Time: 04/20/15  8:15 PM  Result Value Ref Range   B Natriuretic Peptide 211.3 (H) 0.0 - 100.0 pg/mL  CBC     Status: None   Collection Time: 04/20/15  8:15 PM  Result Value Ref Range   WBC 5.8 4.0 - 10.5 K/uL   RBC 4.66 4.22 - 5.81 MIL/uL   Hemoglobin 13.6 13.0 - 17.0 g/dL   HCT 39.9 39.0 - 52.0 %   MCV 85.6 78.0 - 100.0 fL   MCH 29.2 26.0 - 34.0 pg   MCHC 34.1 30.0 - 36.0 g/dL   RDW 14.5 11.5 - 15.5 %   Platelets 225 150 - 400 K/uL  I-stat troponin, ED  (not at [facility])     Status: Abnormal   Collection Time: 04/20/15  8:29 PM  Result Value Ref Range   Troponin i, poc 0.12 (HH) 0.00 - 0.08 ng/mL   Comment NOTIFIED PHYSICIAN    Comment 3            Comment: Due to the release kinetics of cTnI, a negative result within the first hours of the onset of symptoms does not rule out myocardial infarction with certainty. If myocardial infarction is still suspected, repeat the test at appropriate intervals.   MRSA PCR Screening     Status: None   Collection Time: 04/21/15  2:32 AM  Result Value Ref Range   MRSA by PCR NEGATIVE NEGATIVE    Comment:        The GeneXpert MRSA Assay (FDA approved for NASAL specimens only), is one component of a comprehensive MRSA colonization surveillance program. It is not intended to diagnose MRSA infection nor to guide or monitor treatment for MRSA infections.   Troponin I (q 6hr x 3)     Status: Abnormal   Collection Time: 04/21/15  3:00 AM  Result Value Ref Range   Troponin I 0.28 (H) <0.031 ng/mL    Comment:        PERSISTENTLY INCREASED TROPONIN VALUES IN THE RANGE OF 0.04-0.49 ng/mL CAN BE SEEN IN:       -UNSTABLE ANGINA       -CONGESTIVE HEART FAILURE       -MYOCARDITIS       -CHEST TRAUMA        -ARRYHTHMIAS       -LATE PRESENTING MYOCARDIAL INFARCTION       -COPD   CLINICAL FOLLOW-UP RECOMMENDED.   Glucose, capillary     Status: Abnormal   Collection Time: 04/21/15  7:57 AM  Result Value Ref Range   Glucose-Capillary 136 (H) 65 - 99 mg/dL   Comment 1 Notify RN    Comment 2 Document in Chart   CBC     Status: Abnormal   Collection Time: 04/21/15  8:26 AM  Result Value Ref Range   WBC 5.8 4.0 - 10.5 K/uL   RBC 4.45 4.22 - 5.81 MIL/uL   Hemoglobin 13.0 13.0 - 17.0 g/dL   HCT 38.4 (L) 39.0 - 52.0 %   MCV 86.3 78.0 - 100.0 fL   MCH 29.2 26.0 - 34.0 pg   MCHC 33.9 30.0 - 36.0 g/dL   RDW 14.5 11.5 - 15.5 %   Platelets 204 150 - 400 K/uL  IMAGING: Dg Chest 2 View  04/20/2015   CLINICAL DATA:  Chest pain and shortness of breath since last evening.  EXAM: CHEST  2 VIEW  COMPARISON:  01/29/2015  FINDINGS: The heart is mildly enlarged but stable. The right ventricular pacer wires/ AICD is stable. Mild chronic bronchitic changes and streaky basilar scarring. No infiltrates or effusions. The bony thorax is intact.  IMPRESSION: Stable mild cardiac enlargement and chronic lung changes but no acute pulmonary findings.   Electronically Signed   By: Marijo Sanes M.D.   On: 04/20/2015 20:55    HOSPITAL DIAGNOSES: Active Problems:   CHF (congestive heart failure)   Chest pain   IMPRESSION: 1. Chest pain 2. Elevated troponin 3. NICM EF 20-25% 4. H/o NSVT s/p Medtronic AICD 5. History of ongoing cocaine abuse 6. Medication non-compliance  RECOMMENDATION: 1. Mr. Verle presents with what is likely heart failure exacerbation. He had about 15 pound weight gain over a couple of days after being off of medications and used cocaine last about 3 days ago. He had a mild chest discomfort which was dull and has now resolved. Troponin is mildly elevated, but may be more likely consistent with heart failure could be due to coronary spasm. Although he had no significant coronary disease in  2006, ongoing cocaine use could put him at risk for developing coronary artery disease. He is scheduled for a stress test today which is reasonable and will allow [place] to reassess his EF. BNP is only mildly elevated and his exam is consistent with mild CHF. Home heart failure medications have been restarted. He reports recent diuresis and weight loss (346 lbs on 01/07/15  - now 329 lbs).  Will notify CHF service.   Time Spent Directly with Patient: 45 minutes  Pixie Casino, MD, [facility] Attending Cardiologist [place] 04/21/2015, 9:01 AM

## 2015-04-21 NOTE — Progress Notes (Signed)
TRANSFER OF CARE NOTE                                             Subjective: IMTS was called this morning to assume care for Douglas [name] as he is an [facility] patient. In brief, Douglas [name] is a 39 year old man with  NICM w EF 15-25% s/p ICD, HTN, DM2, h/o NSVT, substance abuse admitted last night for dull substernal chest pain that had been constant for about a day in the setting of medication non-compliance and cocaine abuse. He also notes about 15 lbs weight gain in past few days with BNP 211. His troponin was elevated at 0.28 and 0.26. Cardiology saw him this morning and felt this is likely due to heart failure with coronary spasm and will notify the heart failure service. He is scheduled for stress test today.  K was also low at 3.0 and supplemented with KDur 20 mEq once.  On my interview, he says that his symptoms have resolved. His only complaint is he is hungry. He no longer has any chest pain or SOB. Also denies any fevers, abdominal pain, nausea, emesis, constipation, diarrhea, weakness. He thinks he gained about 15 lbs since Friday 6/3. He says he felt swollen when he came in but thinks this has improved. He says he last used cocaine about four days ago. I educated him on the importance of substance abuse cessation for his heart and went over the plan for stress test today.    Objective: Vital signs in last 24 hours: Filed Vitals:   04/21/15 1144 04/21/15 1145 04/21/15 1148 04/21/15 1150  BP: 125/59 107/42 92/50 105/51  Pulse: 67 90 83 80  Temp:      TempSrc:      Resp:      Height:      Weight:      SpO2:       Weight change:   Intake/Output Summary (Last 24 hours) at 04/21/15 1202 Last data filed at 04/21/15 0400  Gross per 24 hour  Intake      0 ml  Output    850 ml  Net   -850 ml   Gen: A&O x 4, No acute distress, obese HEENT: Atraumatic, PERRL, EOMI, sclerae anicteric, moist mucous membranes Heart: Regular rate and  rhythm, normal S1 S2, no murmurs, rubs, or gallops Lungs: Bibasilar crackles, respirations unlabored Abd: Soft, non-tender, non-distended, + bowel sounds, no hepatosplenomegaly Ext: b/l 1+ pitting edema to shins  Lab Results: Basic Metabolic Panel:  Recent Labs Lab 04/20/15 2015 04/21/15 0826  NA 139 140  K 3.0* 2.9*  CL 107 105  CO2 23 27  GLUCOSE 218* 137*  BUN 5* 6  CREATININE 1.27* 1.24  CALCIUM 8.5* 8.0*   Liver Function Tests:  Recent Labs Lab 04/21/15 0826  AST 34  ALT 41  ALKPHOS 79  BILITOT 0.7  PROT 6.1*  ALBUMIN 3.2*   CBC:  Recent Labs Lab 04/20/15 2015 04/21/15 0826  WBC 5.8 5.8  HGB 13.6 13.0  HCT 39.9 38.4*  MCV 85.6 86.3  PLT 225 204   Cardiac Enzymes:  Recent Labs Lab 04/21/15 0300 04/21/15 0826  TROPONINI 0.28* 0.26*   CBG:  Recent Labs Lab 04/21/15 0757  GLUCAP 136*     Micro Results: Recent Results (from the past 240 hour(s))  MRSA PCR Screening     Status: None   Collection Time: 04/21/15  2:32 AM  Result Value Ref Range Status   MRSA by PCR NEGATIVE NEGATIVE Final    Comment:        The GeneXpert MRSA Assay (FDA approved for NASAL specimens only), is one component of a comprehensive MRSA colonization surveillance program. It is not intended to diagnose MRSA infection nor to guide or monitor treatment for MRSA infections.    Studies/Results: Dg Chest 2 View  04/20/2015   CLINICAL DATA:  Chest pain and shortness of breath since last evening.  EXAM: CHEST  2 VIEW  COMPARISON:  01/29/2015  FINDINGS: The heart is mildly enlarged but stable. The right ventricular pacer wires/ AICD is stable. Mild chronic bronchitic changes and streaky basilar scarring. No infiltrates or effusions. The bony thorax is intact.  IMPRESSION: Stable mild cardiac enlargement and chronic lung changes but no acute pulmonary findings.   Electronically Signed   By: Rudie Meyer M.D.   On: 04/20/2015 20:55   Medications: I have reviewed the  patient's current medications. Scheduled Meds: . amiodarone  200 mg Oral Daily  . [START ON 04/22/2015] aspirin EC  81 mg Oral Daily  . atorvastatin  40 mg Oral q1800  . carvedilol  12.5 mg Oral BID WC  . colchicine  0.6 mg Oral BID  . enoxaparin (LOVENOX) injection  75 mg Subcutaneous Q24H  . hydrALAZINE  25 mg Oral TID  . insulin aspart  0-9 Units Subcutaneous TID WC  . isosorbide mononitrate  30 mg Oral Daily  . lisinopril  5 mg Oral BID  . potassium chloride  40 mEq Oral Once  . regadenoson      . sodium chloride  3 mL Intravenous Q12H  . sodium chloride  3 mL Intravenous Q12H  . spironolactone  12.5 mg Oral Daily  . torsemide  60 mg Oral BID   Continuous Infusions:  PRN Meds:.sodium chloride, acetaminophen, morphine injection, sodium chloride Assessment/Plan: Active Problems:   CHF (congestive heart failure)   Chest pain  Acute on chronic combined CHF: Douglas [name] presented with chest pain and SOB in the setting of cocaine use and medication non-compliance. These symptoms have resolved. He is net - 0.85L since presentation and his weight is down to 329 lb from 345 on admission but question measurements. He was seen by cardiology who felt this is CHF exacerbation given his weight gain, LE edema, mildly elevated BNP, but no pulmonary edema on CXR. They have ordered stress test for today. He has NICM s/p ICD with EF 15-25% over the past few years. He sees Dr [name] for CHF and Dr Graciela Husbands for his AICD. At home he is on torsemide 60 mg bid, coreg 12.5 mg bid, spironolactone 12.5 mg daily. -appreciate cards -stress test -TTE -cont torsemide 60 mg bid, coreg 12.5 mg bid, spironolactone 12.5 mg daily -telemetry -I&O, daily weight  Chest pain: I-stat troponin 0.12 with troponins stable at 0.28 then 0.26. No ischemic changes on EKG. Cardiology saw him and are getting a stress test. He is s/p heart catheterization 2006 with normal coronary arteries. This could be cocaine induced vasospasm  as he used cocaine 4  days ago. -appreciate cards -stress test -TTE -final troponin for trend -change ASA 325 mg daily to ASA 81 mg daily -telemetry  Hypokalemia: K 3.0 on admission, hospitalist gave KDur 20 mEq once and this morning 2.9 -KDur 40 mEq after stress test -cont to monitor  HTN: BP since admission 120s-150s/50s-80s. At home he is on coreg 12.5 mg bid, hydralazine 25 mg tid, lisinopril 5 mg bid, spironolactone 12.5 mg daily, torsemide 60 mg bid, amiodarone 200 mg daily, lipitor 40 mg qhs. -cont coreg 12.5 mg bid, hydralazine 25 mg tid, lisinopril 5 mg bid, spironolactone 12.5 mg daily, torsemide 60 mg bid, amiodarone 200 mg daily, lipitor 40 mg qh  DM2: Last hemoglobin A1c 7.5 01/29/15. At home he is on metformin 500 mg bid. AM CBG 136. -hold metformin -SSI sensitive  History of NSVT: NSR on EKG. He is s/p ICD and on amiodarone 200 mg daily per cards -cont amiodarone 200 mg daily -telemetry  CKD2: Presenting creatinine 1.27, baseline about 1.3 -cont to monitor  Substance abuse: Douglas [name] admits to cocaine use about four days ago -social work consult  Dispo: Disposition is deferred at this time, awaiting improvement of current medical problems.  Anticipated discharge in approximately 1-2 day(s).   The patient does have a current PCP ([name], MD) and does need an [facility] hospital follow-up appointment after discharge.  The patient does have transportation limitations that hinder transportation to clinic appointments.  .Services Needed at time of discharge: Y = Yes, Blank = No PT:   OT:   RN:   Equipment:   Other:       Lorenda Hatchet, MD 04/21/2015, 12:02 PM

## 2015-04-21 NOTE — Progress Notes (Signed)
Chart reviewed 39 year old male patient with history of NICM, AICD, HTN, chronic systolic CHF, ongoing cocaine abuse, ran out of medications 4-5 days ago, used Cocaine last thursday and presented with dyspnea and substernal chest pain. Admitted for CHF exacerbation and R/o ACS. Cardiology consulted. Upon review and discussion with patient, patient follows with [facility] IM teaching service (Dr. Johna Roles). Thereby discussed with Dr. Burtis Junes who kindly accepted transfer of this patient onto IM service. TRH will sign off at this time. Please contact us for any further assistance.  Marcellus Scott, MD, FACP, FHM. [facility] Pager [phone]  If 7PM-7AM, please contact night-coverage [email or web address] Password [email or web address] 04/21/2015, 2:04 PM

## 2015-04-22 ENCOUNTER — Observation Stay (HOSPITAL_COMMUNITY): Payer: Medicaid Other

## 2015-04-22 DIAGNOSIS — R7989 Other specified abnormal findings of blood chemistry: Secondary | ICD-10-CM

## 2015-04-22 DIAGNOSIS — Z79899 Other long term (current) drug therapy: Secondary | ICD-10-CM

## 2015-04-22 DIAGNOSIS — R778 Other specified abnormalities of plasma proteins: Secondary | ICD-10-CM | POA: Insufficient documentation

## 2015-04-22 DIAGNOSIS — I130 Hypertensive heart and chronic kidney disease with heart failure and stage 1 through stage 4 chronic kidney disease, or unspecified chronic kidney disease: Secondary | ICD-10-CM

## 2015-04-22 DIAGNOSIS — R079 Chest pain, unspecified: Secondary | ICD-10-CM

## 2015-04-22 DIAGNOSIS — Z95810 Presence of automatic (implantable) cardiac defibrillator: Secondary | ICD-10-CM

## 2015-04-22 DIAGNOSIS — F1410 Cocaine abuse, uncomplicated: Secondary | ICD-10-CM

## 2015-04-22 DIAGNOSIS — R05 Cough: Secondary | ICD-10-CM

## 2015-04-22 DIAGNOSIS — E876 Hypokalemia: Secondary | ICD-10-CM

## 2015-04-22 DIAGNOSIS — I509 Heart failure, unspecified: Secondary | ICD-10-CM

## 2015-04-22 DIAGNOSIS — I5043 Acute on chronic combined systolic (congestive) and diastolic (congestive) heart failure: Secondary | ICD-10-CM

## 2015-04-22 DIAGNOSIS — E1122 Type 2 diabetes mellitus with diabetic chronic kidney disease: Secondary | ICD-10-CM

## 2015-04-22 DIAGNOSIS — N182 Chronic kidney disease, stage 2 (mild): Secondary | ICD-10-CM

## 2015-04-22 LAB — NM MYOCAR MULTI W/SPECT W/WALL MOTION / EF
LV sys vol: 191 mL
LVDIAVOL: 228 mL
Nuc Stress EF: 16 %
RATE: 0.35
SDS: 0
SRS: 10
SSS: 10
TID: 0.93

## 2015-04-22 LAB — BASIC METABOLIC PANEL
Anion gap: 11 (ref 5–15)
BUN: 7 mg/dL (ref 6–20)
CALCIUM: 8.1 mg/dL — AB (ref 8.9–10.3)
CHLORIDE: 102 mmol/L (ref 101–111)
CO2: 28 mmol/L (ref 22–32)
Creatinine, Ser: 1.35 mg/dL — ABNORMAL HIGH (ref 0.61–1.24)
GFR calc Af Amer: >60 mL/min (ref >60)
GFR calc non Af Amer: >60 mL/min (ref >60)
GLUCOSE: 110 mg/dL — AB (ref 65–99)
Potassium: 3.1 mmol/L — ABNORMAL LOW (ref 3.5–5.1)
SODIUM: 141 mmol/L (ref 135–145)

## 2015-04-22 LAB — GLUCOSE, CAPILLARY
GLUCOSE-CAPILLARY: 148 mg/dL — AB (ref 65–99)
GLUCOSE-CAPILLARY: 122 mg/dL — AB (ref 65–99)
GLUCOSE-CAPILLARY: 143 mg/dL — AB (ref 65–99)
Glucose-Capillary: 208 mg/dL — ABNORMAL HIGH (ref 65–99)

## 2015-04-22 LAB — HEMOGLOBIN A1C
HEMOGLOBIN A1C: 8 % — AB (ref 4.8–5.6)
Mean Plasma Glucose: 183 mg/dL

## 2015-04-22 LAB — MAGNESIUM: Magnesium: 1.5 mg/dL — ABNORMAL LOW (ref 1.7–2.4)

## 2015-04-22 LAB — TSH: TSH: 1.228 u[IU]/mL (ref 0.350–4.500)

## 2015-04-22 MED ORDER — PERFLUTREN LIPID MICROSPHERE
1.0000 mL | INTRAVENOUS | Status: AC | PRN
Start: 2015-04-22 — End: 2015-04-22
  Administered 2015-04-22: 4 mL via INTRAVENOUS
  Filled 2015-04-22: qty 10

## 2015-04-22 MED ORDER — BENZONATATE 100 MG PO CAPS
100.0000 mg | ORAL_CAPSULE | Freq: Three times a day (TID) | ORAL | Status: DC | PRN
  Administered 2015-04-22 (×2): 100 mg via ORAL
  Filled 2015-04-22 (×2): qty 1

## 2015-04-22 MED ORDER — TECHNETIUM TC 99M SESTAMIBI GENERIC - CARDIOLITE
30.0000 | Freq: Once | INTRAVENOUS | Status: AC | PRN
  Administered 2015-04-22: 30 via INTRAVENOUS

## 2015-04-22 MED ORDER — POTASSIUM CHLORIDE CRYS ER 20 MEQ PO TBCR
40.0000 meq | EXTENDED_RELEASE_TABLET | Freq: Once | ORAL | Status: AC
  Administered 2015-04-22: 40 meq via ORAL
  Filled 2015-04-22: qty 2

## 2015-04-22 MED ORDER — MAGNESIUM SULFATE 2 GM/50ML IV SOLN
1.0000 g | Freq: Once | INTRAVENOUS | Status: AC
  Administered 2015-04-22: 1 g via INTRAVENOUS
  Filled 2015-04-22: qty 50

## 2015-04-22 NOTE — Care Management Note (Signed)
Case Management Note  Patient Details  Name: HAMZEH TALL MRN: [id number] Date of Birth: 1976/02/28  Subjective/Objective:   Pt admitted for cp. 2 day stress test. Plan for home once stable.                  Action/Plan: CM did check with [facility] and they can f/u with pt outpatient. Contact Information was provided to pt. No further needs from CM at this time.    Expected Discharge Date:                  Expected Discharge Plan:  Home/Self Care  In-House Referral:     Discharge planning Services  CM Consult, Medication Assistance  Post Acute Care Choice:    Choice offered to:     DME Arranged:    DME Agency:     HH Arranged:    HH Agency:     Status of Service:  Completed, signed off  Medicare Important Message Given:  No Date Medicare IM Given:    Medicare IM give by:    Date Additional Medicare IM Given:    Additional Medicare Important Message give by:     If discussed at Long Length of Stay Meetings, dates discussed:    Additional Comments:  Gala Lewandowsky, RN 04/22/2015, 12:00 PM

## 2015-04-22 NOTE — Progress Notes (Signed)
Subjective:  Douglas [name] only complaint this morning after stress test was a dry cough that is developing. He thinks he might be getting a cold but is not concerned about it. He otherwise feels back to his baseline and thinks his swelling has resolved. He denies any chest pain, SOB, fevers, chills, night sweats, abdominal pain, nausea.  Objective: Vital signs in last 24 hours: Filed Vitals:   04/22/15 0000 04/22/15 0400 04/22/15 1016 04/22/15 1140  BP: 124/65 105/60 114/65   Pulse:      Temp: 98.9 F (37.2 C) 99.2 F (37.3 C)  98.7 F (37.1 C)  TempSrc: Oral Oral  Oral  Resp: 14 11    Height:      Weight:  326 lb 9.6 oz (148.145 kg)    SpO2: 97% 98%  96%   Weight change: -19 lb 4.8 oz (-8.754 kg)  Intake/Output Summary (Last 24 hours) at 04/22/15 1248 Last data filed at 04/22/15 0808  Gross per 24 hour  Intake    740 ml  Output   1250 ml  Net   -510 ml   Gen: A&O x 4, No acute distress, obese HEENT: Atraumatic, PERRL, EOMI, sclerae anicteric, moist mucous membranes Heart: Regular rate and rhythm, normal S1 S2, no murmurs, rubs, or gallops Lungs: CTAB, respirations unlabored Abd: Soft, non-tender, non-distended, + bowel sounds, no hepatosplenomegaly Ext: b/l trace pitting edema to ankles  Lab Results: Basic Metabolic Panel:  Recent Labs Lab 04/21/15 0826 04/22/15 0341  NA 140 141  K 2.9* 3.1*  CL 105 102  CO2 27 28  GLUCOSE 137* 110*  BUN 6 7  CREATININE 1.24 1.35*  CALCIUM 8.0* 8.1*   Liver Function Tests:  Recent Labs Lab 04/21/15 0826  AST 34  ALT 41  ALKPHOS 79  BILITOT 0.7  PROT 6.1*  ALBUMIN 3.2*   CBC:  Recent Labs Lab 04/20/15 2015 04/21/15 0826  WBC 5.8 5.8  HGB 13.6 13.0  HCT 39.9 38.4*  MCV 85.6 86.3  PLT 225 204   Cardiac Enzymes:  Recent Labs Lab 04/21/15 0300 04/21/15 0826 04/21/15 1435  TROPONINI 0.28* 0.26* 0.27*   CBG:  Recent Labs Lab 04/21/15 0757 04/21/15 1334 04/21/15 1708 04/21/15 2007 04/22/15 0758  04/22/15 1141  GLUCAP 136* 170* 169* 155* 148* 208*     Micro Results: Recent Results (from the past 240 hour(s))  MRSA PCR Screening     Status: None   Collection Time: 04/21/15  2:32 AM  Result Value Ref Range Status   MRSA by PCR NEGATIVE NEGATIVE Final    Comment:        The GeneXpert MRSA Assay (FDA approved for NASAL specimens only), is one component of a comprehensive MRSA colonization surveillance program. It is not intended to diagnose MRSA infection nor to guide or monitor treatment for MRSA infections.    Studies/Results: Dg Chest 2 View  04/20/2015   CLINICAL DATA:  Chest pain and shortness of breath since last evening.  EXAM: CHEST  2 VIEW  COMPARISON:  01/29/2015  FINDINGS: The heart is mildly enlarged but stable. The right ventricular pacer wires/ AICD is stable. Mild chronic bronchitic changes and streaky basilar scarring. No infiltrates or effusions. The bony thorax is intact.  IMPRESSION: Stable mild cardiac enlargement and chronic lung changes but no acute pulmonary findings.   Electronically Signed   By: Rudie Meyer M.D.   On: 04/20/2015 20:55   Nm Myocar Multi W/spect W/wall Motion / Ef  04/22/2015  There was no ST segment deviation noted during stress.    Medications: I have reviewed the patient's current medications. Scheduled Meds: . amiodarone  200 mg Oral Daily  . aspirin EC  81 mg Oral Daily  . atorvastatin  40 mg Oral q1800  . carvedilol  12.5 mg Oral BID WC  . colchicine  0.6 mg Oral BID  . enoxaparin (LOVENOX) injection  75 mg Subcutaneous Q24H  . hydrALAZINE  25 mg Oral TID  . insulin aspart  0-9 Units Subcutaneous TID WC  . isosorbide mononitrate  30 mg Oral Daily  . lisinopril  5 mg Oral BID  . sodium chloride  3 mL Intravenous Q12H  . sodium chloride  3 mL Intravenous Q12H  . spironolactone  12.5 mg Oral Daily  . torsemide  60 mg Oral BID   Continuous Infusions:  PRN Meds:.sodium chloride, acetaminophen, benzonatate, morphine  injection, sodium chloride Assessment/Plan: Active Problems:   CHF (congestive heart failure)   Chest pain   Non compliance w medication regimen  Acute on chronic combined CHF: Douglas [name] presented with chest pain and SOB in the setting of cocaine use and medication non-compliance. These symptoms have resolved. He is net - 1.36L since presentation and his weight is down to 326 lb from 345 on admission but question measurements. He was seen by cardiology who felt this is CHF exacerbation given his weight gain, LE edema, mildly elevated BNP, but no pulmonary edema on CXR. Stress test read pending He has NICM s/p ICD with EF 15-25% over the past few years. He sees Dr [name] for CHF and Dr Graciela Husbands for his AICD. At home he is on torsemide 60 mg bid, coreg 12.5 mg bid, spironolactone 12.5 mg daily. -appreciate cards -f/u stress test -consider TTE -cont torsemide 60 mg bid, coreg 12.5 mg bid, spironolactone 12.5 mg daily -telemetry -I&O, daily weight  Chest pain: I-stat troponin 0.12 with troponins stable at 0.28 then 0.26 and 0.27. No ischemic changes on EKG. Cardiology saw him and stress test read pending. He is s/p heart catheterization 2006 with normal coronary arteries. This is likely cocaine induced vasospasm as he used cocaine a few days before presentation -appreciate cards -f/u stress test -consider TTE -cont ASA 81 mg daily -telemetry  Hypokalemia: K 3.0 on admission, still 3.1 this morning after supplementation -KDur 40 mEq -check Mg -cont to monitor  Dry Cough: Douglas [name] is developing a dry cough today but ROS otherwise negative and lungs CTAB. This could be a viral URI -tessalon 100 mg tidprn -cont to monitor  HTN: BP this am 114/65. At home he is on coreg 12.5 mg bid, hydralazine 25 mg tid, lisinopril 5 mg bid, spironolactone 12.5 mg daily, torsemide 60 mg bid, amiodarone 200 mg daily, lipitor 40 mg qhs. -cont coreg 12.5 mg bid, hydralazine 25 mg tid, lisinopril 5 mg bid,  spironolactone 12.5 mg daily, torsemide 60 mg bid, amiodarone 200 mg daily, lipitor 40 mg qh  DM2: Last hemoglobin A1c 7.5 01/29/15. At home he is on metformin 500 mg bid. AM CBG 148. -hold metformin -SSI sensitive  History of NSVT: NSR on EKG. He is s/p ICD and on amiodarone 200 mg daily per cards -cont amiodarone 200 mg daily -telemetry  CKD2: Presenting creatinine 1.27, baseline about 1.3, stable as now 1.35 -cont to monitor  Substance abuse: Douglas [name] admits to cocaine use about four days ago -social work consult  Dispo: Disposition is deferred at this time, awaiting improvement of current medical  problems.  Anticipated discharge in approximately 0-1 day(s).   The patient does have a current PCP ([name], MD) and does need an [facility] hospital follow-up appointment after discharge.  The patient does have transportation limitations that hinder transportation to clinic appointments.  .Services Needed at time of discharge: Y = Yes, Blank = No PT:   OT:   RN:   Equipment:   Other:       Lorenda Hatchet, MD 04/22/2015, 12:48 PM

## 2015-04-22 NOTE — Progress Notes (Signed)
  Echocardiogram 2D Echocardiogram has been performed.  Leta Jungling M 04/22/2015, 3:22 PM

## 2015-04-22 NOTE — Progress Notes (Signed)
MD notified of 6 beats run of [facility], strip saved, patient asymptomatic, no new orders received. Will continue to monitor closely.

## 2015-04-22 NOTE — Progress Notes (Signed)
    Awaiting results of stress test, 2 day study.  Donato Schultz, MD

## 2015-04-23 DIAGNOSIS — R7989 Other specified abnormal findings of blood chemistry: Secondary | ICD-10-CM | POA: Diagnosis not present

## 2015-04-23 DIAGNOSIS — I5021 Acute systolic (congestive) heart failure: Secondary | ICD-10-CM | POA: Diagnosis not present

## 2015-04-23 DIAGNOSIS — Z9114 Patient's other noncompliance with medication regimen: Secondary | ICD-10-CM | POA: Diagnosis not present

## 2015-04-23 DIAGNOSIS — R0789 Other chest pain: Secondary | ICD-10-CM | POA: Diagnosis not present

## 2015-04-23 LAB — BASIC METABOLIC PANEL
Anion gap: 12 (ref 5–15)
BUN: 10 mg/dL (ref 6–20)
CHLORIDE: 99 mmol/L — AB (ref 101–111)
CO2: 26 mmol/L (ref 22–32)
CREATININE: 1.34 mg/dL — AB (ref 0.61–1.24)
Calcium: 7.7 mg/dL — ABNORMAL LOW (ref 8.9–10.3)
GFR calc Af Amer: >60 mL/min (ref >60)
GLUCOSE: 117 mg/dL — AB (ref 65–99)
Potassium: 3.3 mmol/L — ABNORMAL LOW (ref 3.5–5.1)
Sodium: 137 mmol/L (ref 135–145)

## 2015-04-23 LAB — MAGNESIUM: Magnesium: 1.6 mg/dL — ABNORMAL LOW (ref 1.7–2.4)

## 2015-04-23 LAB — GLUCOSE, CAPILLARY: Glucose-Capillary: 116 mg/dL — ABNORMAL HIGH (ref 65–99)

## 2015-04-23 MED ORDER — BENZONATATE 100 MG PO CAPS: 100.0000 mg | ORAL_CAPSULE | Freq: Three times a day (TID) | ORAL | Status: DC | PRN

## 2015-04-23 MED ORDER — DEXTROMETHORPHAN POLISTIREX ER 30 MG/5ML PO SUER: 15.0000 mg | Freq: Two times a day (BID) | ORAL | Status: DC | PRN

## 2015-04-23 MED ORDER — MAGNESIUM SULFATE 2 GM/50ML IV SOLN
2.0000 g | Freq: Once | INTRAVENOUS | Status: AC
  Administered 2015-04-23: 2 g via INTRAVENOUS
  Filled 2015-04-23: qty 50

## 2015-04-23 MED ORDER — ISOSORB DINITRATE-HYDRALAZINE 20-37.5 MG PO TABS
1.0000 | ORAL_TABLET | Freq: Two times a day (BID) | ORAL | Status: DC
  Administered 2015-04-23: 1 via ORAL
  Filled 2015-04-23: qty 1

## 2015-04-23 MED ORDER — DEXTROMETHORPHAN POLISTIREX ER 30 MG/5ML PO SUER
15.0000 mg | Freq: Two times a day (BID) | ORAL | Status: DC | PRN
  Filled 2015-04-23: qty 5

## 2015-04-23 MED ORDER — POTASSIUM CHLORIDE CRYS ER 20 MEQ PO TBCR
40.0000 meq | EXTENDED_RELEASE_TABLET | Freq: Once | ORAL | Status: AC
  Administered 2015-04-23: 40 meq via ORAL
  Filled 2015-04-23: qty 2

## 2015-04-23 MED ORDER — ASPIRIN 81 MG PO TBEC: 81.0000 mg | DELAYED_RELEASE_TABLET | Freq: Every day | ORAL | Status: DC

## 2015-04-23 MED ORDER — ISOSORB DINITRATE-HYDRALAZINE 20-37.5 MG PO TABS: 1.0000 | ORAL_TABLET | Freq: Two times a day (BID) | ORAL | Status: DC

## 2015-04-23 NOTE — Progress Notes (Signed)
Patient Profile: 39 y.o. male with a past medical history significant for hypertension, dyslipidemia, obesity and nonischemic cardio myopathy with EF of 15-25% over the past several years. He is status post [employer] AICD and is followed by Dr. Graciela Husbands. He had heart catheterization 2006 which showed normal coronaries. At that time EF was 20-25% however dropped to 15% in 2014.  Admitted 04/21/15 for acute HF exacerbation and chest pain with mildly elevated troponin.   Subjective: Feels significantly better. No dyspnea. No recurrent CP.   Objective: Vital signs in last 24 hours: Temp:  [98 F (36.7 C)-98.7 F (37.1 C)] 98.6 F (37 C) (06/08 0800) Pulse Rate:  [71-80] 76 (06/08 0800) Resp:  [16-20] 20 (06/08 0800) BP: (100-133)/(47-94) 133/94 mmHg (06/08 0800) SpO2:  [96 %-99 %] 99 % (06/08 0800) Weight:  [326 lb 3.2 oz (147.963 kg)] 326 lb 3.2 oz (147.963 kg) (06/08 0400) Last BM Date: 04/22/15  Intake/Output from previous day: 06/07 0701 - 06/08 0700 In: 1220 [P.O.:1220] Out: 2950 [Urine:2950] Intake/Output this shift: Total I/O In: -  Out: 350 [Urine:350]  Medications Current Facility-Administered Medications  Medication Dose Route Frequency Provider Last Rate Last Dose  . 0.9 %  sodium chloride infusion  250 mL Intravenous PRN Pearson Grippe, MD      . acetaminophen (TYLENOL) tablet 650 mg  650 mg Oral Q6H PRN Elease Etienne, MD   650 mg at 04/22/15 2158  . amiodarone (PACERONE) tablet 200 mg  200 mg Oral Daily Earley Favor, NP   200 mg at 04/22/15 1017  . aspirin EC tablet 81 mg  81 mg Oral Daily Lorenda Hatchet, MD   81 mg at 04/22/15 1016  . atorvastatin (LIPITOR) tablet 40 mg  40 mg Oral q1800 Pearson Grippe, MD   40 mg at 04/22/15 1712  . benzonatate (TESSALON) capsule 100 mg  100 mg Oral TID PRN Lorenda Hatchet, MD   100 mg at 04/22/15 2158  . carvedilol (COREG) tablet 12.5 mg  12.5 mg Oral BID WC Earley Favor, NP   12.5 mg at 04/22/15 1712  . colchicine tablet 0.6 mg  0.6 mg  Oral BID Pearson Grippe, MD   0.6 mg at 04/22/15 2157  . enoxaparin (LOVENOX) injection 75 mg  75 mg Subcutaneous Q24H Pearson Grippe, MD   75 mg at 04/22/15 1320  . hydrALAZINE (APRESOLINE) tablet 25 mg  25 mg Oral TID Earley Favor, NP   25 mg at 04/22/15 2157  . insulin aspart (novoLOG) injection 0-9 Units  0-9 Units Subcutaneous TID WC Pearson Grippe, MD   1 Units at 04/22/15 1712  . isosorbide mononitrate (IMDUR) 24 hr tablet 30 mg  30 mg Oral Daily Earley Favor, NP   30 mg at 04/22/15 1016  . lisinopril (PRINIVIL,ZESTRIL) tablet 5 mg  5 mg Oral BID Earley Favor, NP   5 mg at 04/22/15 2157  . magnesium sulfate IVPB 2 g 50 mL  2 g Intravenous Once Lorenda Hatchet, MD   2 g at 04/23/15 0750  . morphine 2 MG/ML injection 0.5 mg  0.5 mg Intravenous Q4H PRN Pearson Grippe, MD   0.5 mg at 04/21/15 0256  . sodium chloride 0.9 % injection 3 mL  3 mL Intravenous Q12H Pearson Grippe, MD   3 mL at 04/21/15 2200  . sodium chloride 0.9 % injection 3 mL  3 mL Intravenous Q12H Pearson Grippe, MD   3 mL at 04/22/15 2200  . sodium chloride 0.9 %  injection 3 mL  3 mL Intravenous PRN Pearson Grippe, MD      . spironolactone (ALDACTONE) tablet 12.5 mg  12.5 mg Oral Daily Earley Favor, NP   12.5 mg at 04/22/15 1017  . torsemide (DEMADEX) tablet 60 mg  60 mg Oral BID Earley Favor, NP   60 mg at 04/22/15 1712    PE: General appearance: alert, cooperative and no distress Neck: no carotid bruit and no JVD Lungs: clear to auscultation bilaterally Heart: regular rate and rhythm, S1, S2 normal, no murmur, click, rub or gallop Extremities: no LEE Pulses: 2+ and symmetric Skin: warm and dry Neurologic: Grossly normal  Lab Results:   Recent Labs  04/20/15 2015 04/21/15 0826  WBC 5.8 5.8  HGB 13.6 13.0  HCT 39.9 38.4*  PLT 225 204   BMET  Recent Labs  04/21/15 0826 04/22/15 0341 04/23/15 0551  NA 140 141 137  K 2.9* 3.1* 3.3*  CL 105 102 99*  CO2 27 28 26   GLUCOSE 137* 110* 117*  BUN 6 7 10   CREATININE 1.24 1.35* 1.34*  CALCIUM 8.0*  8.1* 7.7*   PT/INR No results for input(s): LABPROT, INR in the last 72 hours. Cholesterol No results for input(s): CHOL in the last 72 hours. Cardiac Panel (last 3 results)  Recent Labs  04/21/15 0300 04/21/15 0826 04/21/15 1435  TROPONINI 0.28* 0.26* 0.27*    Studies/Results: NST  04/22/15   There was no ST segment deviation noted during stress.  Findings consistent with prior myocardial infarction with peri-infarct ischemia.  This is a high risk study.  Fixed anterior and lateral perfusion defects, no reversible ischemia. This could indicate scar or decreased perfusion due to infiltrative disease.  LV cavity size is severely enlarged.  High risk study. LVEF is 16% with global hypokinesis. There are moderate sized and intensity, fixed anterior and lateral wall defects, without reversible ischemia. This may indicate scar or decreased perfusion due to infiltrative disease. The septum and inferior walls appear viable.   2D Echo 04/22/15 Study Conclusions  - Left ventricle: The cavity size was moderately dilated. Wall thickness was increased in a pattern of mild LVH. Systolic function was severely reduced. The estimated ejection fraction was in the range of 25% to 30%. There is hypokinesis of the inferior myocardium. Doppler parameters are consistent with abnormal left ventricular relaxation (grade 1 diastolic dysfunction). - Mitral valve: There was mild regurgitation. - Left atrium: The atrium was moderately dilated. - Right atrium: The atrium was mildly dilated.   Assessment/Plan  Active Problems:   CHF (congestive heart failure)   Chest pain   Non compliance w medication regimen   Elevated troponin level    1. Acute on Chronic Combined Systolic + Diastolic CHF: diuresed well. No further dyspnea or DOE. Euvolemic on exam. Continue medical therapy. PO Torsemide. Send home with patient education (daily weights, low sodium diet).    2. Nonischemic  Cardiomyopathy: EF 25-30% on echo. He has an ICD. Continue medical therapy with Coreg, hydralazine + Imdur, and spironolactone. No ACE due to renal function.    3. Chest Pain with Mildly Elevated Troponin: flat trend in enzyme levels 0.28, 0.26, 0.27. No recurrent CP. NST shows  fixed anterior and lateral perfusion defects, no reversible ischemia. This could indicate scar or decreased perfusion due to infiltrative disease. EF 25-30%. NST findings consistent with nonischemic cardiomyopathy. Troponin elevation likely from demand ischemia 2/2 acute on chronic CHF. No further ischemic w/u indicated.   4. Hypokalemia: K is  3.3. Repleat.     Brittainy M. Delmer Islam 04/23/2015 8:36 AM  Personally seen and examined. Agree with above. NUC stress test is labeled high risk secondary to low EF. No ischemia noted. Non ischemic cardiomyopathy. Encouraged cocaine cessation. May lead to sudden death. OK for DC home.  Donato Schultz, MD

## 2015-04-23 NOTE — Progress Notes (Signed)
MD notified of 8 beat run of VTACH, strip saved, patient asymptomatic. Pt states that he felt his heart beat when it happened and he was coughing at the time. No complaints of distress from patient at this time other than unrelenting cough. VSS. No new orders received, will continue to monitor closely.

## 2015-04-23 NOTE — Discharge Instructions (Signed)
It was a pleasure to care for you at [facility]. We have changed your imdur 30 mg po daily and hydralazine 25 mg three times a day to bidil (isosorbide dinitrate and hydralazine combined) 20-37.5 mg po twice a day to make you have less pills. Please stop taking the imdur and hydralazine separately. We have also started a small aspirin once a day and given you cough medicine for as needed use. Please attend your follow-up appointment with the [facility] clinic and cardiology. Please return to the Emergency Department or seek medical attention if you have any new or worsening chest pain, trouble breathing, or other worrisome medical condition.   Farley Ly, MD   Chest Pain (Nonspecific) It is often hard to give a specific diagnosis for the cause of chest pain. There is always a chance that your pain could be related to something serious, such as a heart attack or a blood clot in the lungs. You need to follow up with your health care provider for further evaluation. CAUSES   Heartburn.  Pneumonia or bronchitis.  Anxiety or stress.  Inflammation around your heart (pericarditis) or lung (pleuritis or pleurisy).  A blood clot in the lung.  A collapsed lung (pneumothorax). It can develop suddenly on its own (spontaneous pneumothorax) or from trauma to the chest.  Shingles infection (herpes zoster virus). The chest wall is composed of bones, muscles, and cartilage. Any of these can be the source of the pain.  The bones can be bruised by injury.  The muscles or cartilage can be strained by coughing or overwork.  The cartilage can be affected by inflammation and become sore (costochondritis). DIAGNOSIS  Lab tests or other studies may be needed to find the cause of your pain. Your health care provider may have you take a test called an ambulatory electrocardiogram (ECG). An ECG records your heartbeat patterns over a 24-hour period. You may also have other tests, such as:  Transthoracic echocardiogram  (TTE). During echocardiography, sound waves are used to evaluate how blood flows through your heart.  Transesophageal echocardiogram (TEE).  Cardiac monitoring. This allows your health care provider to monitor your heart rate and rhythm in real time.  Holter monitor. This is a portable device that records your heartbeat and can help diagnose heart arrhythmias. It allows your health care provider to track your heart activity for several days, if needed.  Stress tests by exercise or by giving medicine that makes the heart beat faster. TREATMENT   Treatment depends on what may be causing your chest pain. Treatment may include:  Acid blockers for heartburn.  Anti-inflammatory medicine.  Pain medicine for inflammatory conditions.  Antibiotics if an infection is present.  You may be advised to change lifestyle habits. This includes stopping smoking and avoiding alcohol, caffeine, and chocolate.  You may be advised to keep your head raised (elevated) when sleeping. This reduces the chance of acid going backward from your stomach into your esophagus. Most of the time, nonspecific chest pain will improve within 2-3 days with rest and mild pain medicine.  HOME CARE INSTRUCTIONS   If antibiotics were prescribed, take them as directed. Finish them even if you start to feel better.  For the next few days, avoid physical activities that bring on chest pain. Continue physical activities as directed.  Do not use any tobacco products, including cigarettes, chewing tobacco, or electronic cigarettes.  Avoid drinking alcohol.  Only take medicine as directed by your health care provider.  Follow your health  care provider's suggestions for further testing if your chest pain does not go away.  Keep any follow-up appointments you made. If you do not go to an appointment, you could develop lasting (chronic) problems with pain. If there is any problem keeping an appointment, call to reschedule. SEEK  MEDICAL CARE IF:   Your chest pain does not go away, even after treatment.  You have a rash with blisters on your chest.  You have a fever. SEEK IMMEDIATE MEDICAL CARE IF:   You have increased chest pain or pain that spreads to your arm, neck, jaw, back, or abdomen.  You have shortness of breath.  You have an increasing cough, or you cough up blood.  You have severe back or abdominal pain.  You feel nauseous or vomit.  You have severe weakness.  You faint.  You have chills. This is an emergency. Do not wait to see if the pain will go away. Get medical help at once. Call your local emergency services (911 in [place] Do not drive yourself to the hospital. MAKE SURE YOU:   Understand these instructions.  Will watch your condition.  Will get help right away if you are not doing well or get worse. Document Released: 08/11/2005 Document Revised: 11/06/2013 Document Reviewed: 06/06/2008 [facility] Patient Information 2015 [place], [place]. This information is not intended to replace advice given to you by your health care provider. Make sure you discuss any questions you have with your health care provider.

## 2015-04-23 NOTE — Discharge Summary (Signed)
Name: Douglas [name] MRN: [id number] DOB: 1976/10/26 39 y.o. PCP: [name], MD  Date of Admission: 04/20/2015  8:22 PM Date of Discharge: 04/23/2015 Attending Physician: Doneen Poisson, MD  Discharge Diagnosis:  Active Problems:   CHF (congestive heart failure)   Chest pain   Non compliance w medication regimen   Elevated troponin level  Discharge Medications:   Medication List    STOP taking these medications        clindamycin 300 MG capsule  Commonly known as:  CLEOCIN     hydrALAZINE 25 MG tablet  Commonly known as:  APRESOLINE     HYDROcodone-acetaminophen 5-325 MG per tablet  Commonly known as:  NORCO/VICODIN     isosorbide mononitrate 30 MG 24 hr tablet  Commonly known as:  IMDUR      TAKE these medications        acetaminophen 500 MG tablet  Commonly known as:  TYLENOL  Take 500 mg by mouth every 6 (six) hours as needed for mild pain.     amiodarone 200 MG tablet  Commonly known as:  PACERONE  Take 1 tablet (200 mg total) by mouth daily.     aspirin 81 MG EC tablet  Take 1 tablet (81 mg total) by mouth daily.     atorvastatin 40 MG tablet  Commonly known as:  LIPITOR  Take 1 tablet (40 mg total) by mouth daily at 6 PM.     benzonatate 100 MG capsule  Commonly known as:  TESSALON  Take 1 capsule (100 mg total) by mouth 3 (three) times daily as needed for cough.     carvedilol 12.5 MG tablet  Commonly known as:  COREG  Take 1 tablet (12.5 mg total) by mouth 2 (two) times daily with a meal.     clotrimazole-betamethasone cream  Commonly known as:  LOTRISONE  Apply 1 application topically 2 (two) times daily.     colchicine 0.6 MG tablet  Take 1 tablet (0.6 mg total) by mouth 2 (two) times daily.     dextromethorphan 30 MG/5ML liquid  Commonly known as:  DELSYM  Take 2.5 mLs (15 mg total) by mouth 2 (two) times daily as needed for cough.     isosorbide-hydrALAZINE 20-37.5 MG per tablet  Commonly known as:  BIDIL  Take 1 tablet by  mouth 2 (two) times daily.     lisinopril 5 MG tablet  Commonly known as:  PRINIVIL,ZESTRIL  Take 1 tablet (5 mg total) by mouth 2 (two) times daily.     metFORMIN 500 MG tablet  Commonly known as:  GLUCOPHAGE  Take 1 tablet (500 mg total) by mouth 2 (two) times daily with a meal.     metolazone 2.5 MG tablet  Commonly known as:  ZAROXOLYN  Take 1 tablet (2.5 mg total) by mouth as needed.     spironolactone 25 MG tablet  Commonly known as:  ALDACTONE  Take 0.5 tablets (12.5 mg total) by mouth daily.     torsemide 20 MG tablet  Commonly known as:  DEMADEX  Take 3 tablets (60 mg total) by mouth 2 (two) times daily.        Disposition and follow-up:   [name] was discharged from [facility] in [place] condition.  At the hospital follow up visit please address:  1.  Substance abuse, any further cough  2.  Labs / imaging needed at time of follow-up: BMP (K), Mg  3.  Pending  labs/ test needing follow-up: none  Follow-up Appointments: Follow-up Information    Follow up with Douglas Masson, MD On 05/07/2015.   Specialty:  Internal Medicine   Why:  @ 10:15 am   Contact information:   [address] [place] [address] [phone]       Follow up with Douglas Fredrickson, NP On 05/22/2015.   Specialties:  Nurse Practitioner, Interventional Cardiology, Cardiology, Radiology   Why:  @ 9 :15 am   Contact information:   [address] [place] [address] [phone]      Procedures Performed:  Dg Chest 2 View  04/20/2015   CLINICAL DATA:  Chest pain and shortness of breath since last evening.  EXAM: CHEST  2 VIEW  COMPARISON:  01/29/2015  FINDINGS: The heart is mildly enlarged but stable. The right ventricular pacer wires/ AICD is stable. Mild chronic bronchitic changes and streaky basilar scarring. No infiltrates or effusions. The bony thorax is intact.  IMPRESSION: Stable mild cardiac enlargement and chronic lung changes but no acute  pulmonary findings.   Electronically Signed   By: Rudie Meyer M.D.   On: 04/20/2015 20:55   Nm Myocar Multi W/spect W/wall Motion / Ef  04/22/2015    There was no ST segment deviation noted during stress.  Findings consistent with prior myocardial infarction with peri-infarct  ischemia.  This is a high risk study.  Fixed anterior and lateral perfusion defects, no reversible ischemia.  This could indicate scar or decreased perfusion due to infiltrative  disease.  LV cavity size is severely enlarged.   High risk study. LVEF is 16% with global hypokinesis. There are moderate  sized and intensity, fixed anterior and lateral wall defects, without  reversible ischemia. This may indicate scar or decreased perfusion due to  infiltrative disease. The septum and inferior walls appear viable.  Douglas Nose, MD, [facility] Board Certified in Nuclear Cardiology Attending Cardiologist [facility]    04/22/2015    There was no ST segment deviation noted during stress.  Findings consistent with prior myocardial infarction with peri-infarct  ischemia.  This is a high risk study.  Fixed anterior and lateral perfusion defects, no reversible ischemia.  This could indicate scar or decreased perfusion due to infiltrative  disease.  LV cavity size is severely enlarged.   High risk study. LVEF is 16% with global hypokinesis. There are moderate  sized and intensity, fixed anterior and lateral wall defects, without  reversible ischemia. This may indicate scar or decreased perfusion due to  infiltrative disease. The septum and inferior walls appear viable.   2D Echo 04/22/15 Study Conclusions  - Left ventricle: The cavity size was moderately dilated. Wall thickness was increased in a pattern of mild LVH. Systolic function was severely reduced. The estimated ejection fraction was in the range of 25% to 30%. There is hypokinesis of the inferior myocardium. Doppler parameters are consistent with abnormal left  ventricular relaxation (grade 1 diastolic dysfunction). - Mitral valve: There was mild regurgitation. - Left atrium: The atrium was moderately dilated. - Right atrium: The atrium was mildly dilated.  Admission HPI: IMTS was called 04/21/15 to assume care for Mr Cutting as he is an [facility] patient. In brief, Mr Vanderveen is a 39 year old man with NICM w EF 15-25% s/p ICD, HTN, DM2, h/o NSVT, substance abuse admitted last night for dull substernal chest pain that had been constant for about a day in the setting of medication non-compliance and cocaine abuse.  He also notes about 15 lbs weight gain in past few days with BNP 211. His troponin was elevated at 0.28 and 0.26. Cardiology saw him this morning and felt this is likely due to heart failure with coronary spasm and will notify the heart failure service. He is scheduled for stress test today. K was also low at 3.0 and supplemented with KDur 20 mEq once.  On my interview, he says that his symptoms have resolved. His only complaint is he is hungry. He no longer has any chest pain or SOB. Also denies any fevers, abdominal pain, nausea, emesis, constipation, diarrhea, weakness. He thinks he gained about 15 lbs since Friday 6/3. He says he felt swollen when he came in but thinks this has improved. He says he last used cocaine about four days ago. I educated him on the importance of substance abuse cessation for his heart and went over the plan for stress test today.   Hospital Course by problem list: Active Problems:   CHF (congestive heart failure)   Chest pain   Non compliance w medication regimen   Elevated troponin level   Acute on chronic combined CHF: Mr Zehnder presented with chest pain and SOB in the setting of cocaine use and medication non-compliance which have since resolved.For diuresis he was continued on his home torsemide 60 mg bid. He is net - 2.84L since presentation and his weight is down to 326 lb from 345 on admission but question  measurements. He was seen by cardiology who felt this is CHF exacerbation given his weight gain, LE edema, mildly elevated BNP, but no pulmonary edema on CXR. A two day stress test was read as high risk study with LVEF 16% and moderate sized and intensity, fixed anterior and lateral wall defects without reversible ischemia that could be scar or decreased perfusion due to infiltrative disease. Cardiology interpreted as no acute findings and has cleared him for discharge home. He is scheduled PCP and cardiology follow-up. He has NICM s/p ICD with EF 15-25% over the past few years. He sees Dr [name] for CHF and Dr Graciela Husbands for his AICD. At home he is on torsemide 60 mg bid, coreg 12.5 mg bid, spironolactone 12.5 mg daily, imdur 30 mg po daily, hydralazine 25 mg tid, lisinopril 5 mg bid which were continued with the caveat that we changed imdur 30 mg po daily and hydralazine 25 mg tid to bidil 20-37.5 mg po bid to improve compliance.  Chest pain: I-stat troponin 0.12 with troponins stable at 0.28 then 0.26 and 0.27. No ischemic changes on EKG. Cardiology saw him and performed a two day stress test that was read as high risk due to known low LVEF and while findings consistent with prior infarct, no acute issue. This is likely cocaine induced vasospasm as he used cocaine a few days before presentation and cessation was encouraged. He was started on ASA 81 mg daily in addition to his previous medications and scheduled cardiology follow-up.  Hypokalemia 2/2 Hypomagnesemia: K 3.0 on admission with Mg 1.5, now K 3.3 with Mg 1.6 following supplementation and prior to discharge. He was given an additional magnesium sulfate 2 g iv once and KDur 40 mEq before discharge. A BMP and Mg should be checked on PCP hospital follow-up.  Dry Cough: Mr Koeppen developed a dry cough a day before discharge but ROS otherwise negative and lungs CTAB. This could be a viral URI. He was started on tessalon 100 mg tidprn and dextromethorphan  15 mg  po bidprn. PCP hospital follow-up appointment should reassess if this has improved.  HTN: BP generally well controlled while hospitalized. At home he is on coreg 12.5 mg bid, hydralazine 25 mg tid, lisinopril 5 mg bid,imdur 30 mg daily, spironolactone 12.5 mg daily, torsemide 60 mg bid, amiodarone 200 mg daily, lipitor 40 mg qhs. These were all continued with the caveat that prior to discharge we changed imdur 30 mg po daily and hydralazine 25 mg tid to bidil 20-37.5 mg po bid to help with compliance.  DM2: Last hemoglobin A1c 7.5 01/29/15. At home he is on metformin 500 mg bid which was replaced with a sensitive sliding scale while hospitalized but resumed on discharge.  History of NSVT: NSR on EKG. He is s/p ICD and on amiodarone 200 mg daily per cards which was continued.  CKD2: Presenting creatinine 1.27, baseline about 1.3, stable as now 1.34 on discharge.  Substance abuse: Mr Venuti admits to cocaine use about four days prior to admission. He was counseled several times of the danger of cocaine use.  Discharge Vitals:   BP 133/94 mmHg  Pulse 76  Temp(Src) 98.6 F (37 C) (Oral)  Resp 20  Ht 6\' 4"  (1.93 m)  Wt 326 lb 3.2 oz (147.963 kg)  BMI 39.72 kg/m2  SpO2 99%  Discharge Physical Exam: Gen: A&O x 4, No acute distress, obese HEENT: Atraumatic, PERRL, EOMI, sclerae anicteric, moist mucous membranes Heart: Regular rate and rhythm, normal S1 S2, no murmurs, rubs, or gallops Lungs: CTAB, respirations unlabored Abd: Soft, non-tender, non-distended, + bowel sounds, no hepatosplenomegaly Ext: b/l trace pitting edema to ankles  Discharge Labs:  Basic Metabolic Panel:  Recent Labs Lab 04/22/15 0341 04/22/15 1202 04/23/15 0551  NA 141  --  137  K 3.1*  --  3.3*  CL 102  --  99*  CO2 28  --  26  GLUCOSE 110*  --  117*  BUN 7  --  10  CREATININE 1.35*  --  1.34*  CALCIUM 8.1*  --  7.7*  MG  --  1.5* 1.6*   Liver Function Tests:  Recent Labs Lab 04/21/15 0826  AST  34  ALT 41  ALKPHOS 79  BILITOT 0.7  PROT 6.1*  ALBUMIN 3.2*  CBC:  Recent Labs Lab 04/20/15 2015 04/21/15 0826  WBC 5.8 5.8  HGB 13.6 13.0  HCT 39.9 38.4*  MCV 85.6 86.3  PLT 225 204   Cardiac Enzymes:  Recent Labs Lab 04/21/15 0300 04/21/15 0826 04/21/15 1435  TROPONINI 0.28* 0.26* 0.27*   CBG:  Recent Labs Lab 04/21/15 2007 04/22/15 0758 04/22/15 1141 04/22/15 1623 04/22/15 2050 04/23/15 0726  GLUCAP 155* 148* 208* 122* 143* 116*   Hemoglobin A1C:  Recent Labs Lab 04/21/15 0300  HGBA1C 8.0*   Thyroid Function Tests:  Recent Labs Lab 04/22/15 1202  TSH 1.228   Urine Drug Screen: Drugs of Abuse     Component Value Date/Time   LABOPIA NONE DETECTED 01/29/2015 1214   COCAINSCRNUR POSITIVE* 01/29/2015 1214   LABBENZ NONE DETECTED 01/29/2015 1214   AMPHETMU NONE DETECTED 01/29/2015 1214   THCU NONE DETECTED 01/29/2015 1214   LABBARB NONE DETECTED 01/29/2015 1214    Troponin 0.28, 0.26, 0.27 BNP 211   Signed: Lorenda Hatchet, MD 04/23/2015, 11:11 AM    Services Ordered on Discharge: none Equipment Ordered on Discharge: none

## 2015-04-23 NOTE — Progress Notes (Signed)
Subjective:  Mr Zales still complains of dry cough. He denies any SOB or chest pain. He says other than cough he is back to baseline. I explained results of his stress test and that it did not show any new issues. He was agreeable with discharge home.  Objective: Vital signs in last 24 hours: Filed Vitals:   04/22/15 2041 04/23/15 0000 04/23/15 0400 04/23/15 0800  BP: 123/77 100/47 123/81 133/94  Pulse: 80 71 78 76  Temp: 98.6 F (37 C) 98.5 F (36.9 C) 98 F (36.7 C) 98.6 F (37 C)  TempSrc: Oral Oral Oral Oral  Resp: 16 16 18 20   Height:      Weight:   326 lb 3.2 oz (147.963 kg)   SpO2: 97% 97% 99% 99%   Weight change: -6.4 oz (-0.181 kg)  Intake/Output Summary (Last 24 hours) at 04/23/15 1045 Last data filed at 04/23/15 0830  Gross per 24 hour  Intake   1220 ml  Output   2700 ml  Net  -1480 ml   Gen: A&O x 4, No acute distress, obese HEENT: Atraumatic, PERRL, EOMI, sclerae anicteric, moist mucous membranes Heart: Regular rate and rhythm, normal S1 S2, no murmurs, rubs, or gallops Lungs: CTAB, respirations unlabored Abd: Soft, non-tender, non-distended, + bowel sounds, no hepatosplenomegaly Ext: b/l trace pitting edema to ankles  Lab Results: Basic Metabolic Panel:  Recent Labs Lab 04/22/15 0341 04/22/15 1202 04/23/15 0551  NA 141  --  137  K 3.1*  --  3.3*  CL 102  --  99*  CO2 28  --  26  GLUCOSE 110*  --  117*  BUN 7  --  10  CREATININE 1.35*  --  1.34*  CALCIUM 8.1*  --  7.7*  MG  --  1.5* 1.6*   Liver Function Tests:  Recent Labs Lab 04/21/15 0826  AST 34  ALT 41  ALKPHOS 79  BILITOT 0.7  PROT 6.1*  ALBUMIN 3.2*   CBC:  Recent Labs Lab 04/20/15 2015 04/21/15 0826  WBC 5.8 5.8  HGB 13.6 13.0  HCT 39.9 38.4*  MCV 85.6 86.3  PLT 225 204   Cardiac Enzymes:  Recent Labs Lab 04/21/15 0300 04/21/15 0826 04/21/15 1435  TROPONINI 0.28* 0.26* 0.27*   CBG:  Recent Labs Lab 04/21/15 2007 04/22/15 0758 04/22/15 1141  04/22/15 1623 04/22/15 2050 04/23/15 0726  GLUCAP 155* 148* 208* 122* 143* 116*     Micro Results: Recent Results (from the past 240 hour(s))  MRSA PCR Screening     Status: None   Collection Time: 04/21/15  2:32 AM  Result Value Ref Range Status   MRSA by PCR NEGATIVE NEGATIVE Final    Comment:        The GeneXpert MRSA Assay (FDA approved for NASAL specimens only), is one component of a comprehensive MRSA colonization surveillance program. It is not intended to diagnose MRSA infection nor to guide or monitor treatment for MRSA infections.    Studies/Results: Nm Myocar Multi W/spect W/wall Motion / Ef  04/22/2015    There was no ST segment deviation noted during stress.  Findings consistent with prior myocardial infarction with peri-infarct  ischemia.  This is a high risk study.  Fixed anterior and lateral perfusion defects, no reversible ischemia.  This could indicate scar or decreased perfusion due to infiltrative  disease.  LV cavity size is severely enlarged.   High risk study. LVEF is 16% with global hypokinesis. There are moderate  sized and  intensity, fixed anterior and lateral wall defects, without  reversible ischemia. This may indicate scar or decreased perfusion due to  infiltrative disease. The septum and inferior walls appear viable.  Chrystie Nose, MD, [facility] Board Certified in Nuclear Cardiology Attending Cardiologist [facility]    04/22/2015    There was no ST segment deviation noted during stress.  Findings consistent with prior myocardial infarction with peri-infarct  ischemia.  This is a high risk study.  Fixed anterior and lateral perfusion defects, no reversible ischemia.  This could indicate scar or decreased perfusion due to infiltrative  disease.  LV cavity size is severely enlarged.   High risk study. LVEF is 16% with global hypokinesis. There are moderate  sized and intensity, fixed anterior and lateral wall defects, without  reversible ischemia.  This may indicate scar or decreased perfusion due to  infiltrative disease. The septum and inferior walls appear viable.   2D Echo 04/22/15 Study Conclusions  - Left ventricle: The cavity size was moderately dilated. Wall thickness was increased in a pattern of mild LVH. Systolic function was severely reduced. The estimated ejection fraction was in the range of 25% to 30%. There is hypokinesis of the inferior myocardium. Doppler parameters are consistent with abnormal left ventricular relaxation (grade 1 diastolic dysfunction). - Mitral valve: There was mild regurgitation. - Left atrium: The atrium was moderately dilated. - Right atrium: The atrium was mildly dilated.  Medications: I have reviewed the patient's current medications. Scheduled Meds: . amiodarone  200 mg Oral Daily  . aspirin EC  81 mg Oral Daily  . atorvastatin  40 mg Oral q1800  . carvedilol  12.5 mg Oral BID WC  . colchicine  0.6 mg Oral BID  . enoxaparin (LOVENOX) injection  75 mg Subcutaneous Q24H  . insulin aspart  0-9 Units Subcutaneous TID WC  . isosorbide-hydrALAZINE  1 tablet Oral BID  . lisinopril  5 mg Oral BID  . sodium chloride  3 mL Intravenous Q12H  . sodium chloride  3 mL Intravenous Q12H  . spironolactone  12.5 mg Oral Daily  . torsemide  60 mg Oral BID   Continuous Infusions:  PRN Meds:.sodium chloride, acetaminophen, benzonatate, dextromethorphan, morphine injection, sodium chloride Assessment/Plan: Active Problems:   CHF (congestive heart failure)   Chest pain   Non compliance w medication regimen   Elevated troponin level  Acute on chronic combined CHF: Mr Stead presented with chest pain and SOB in the setting of cocaine use and medication non-compliance which have since resolved.  He is net - 2.84L since presentation and his weight is down to 326 lb from 345 on admission but question measurements. He was seen by cardiology who felt this is CHF exacerbation given his weight  gain, LE edema, mildly elevated BNP, but no pulmonary edema on CXR. Stress test read as high risk study with LVEF 16% and moderate sized and intensity, fixed anterior and lateral wall defects without reversible ischemia that could be scar or decreased perfusion due to infiltrative disease. Cardiology has cleared him for discharge home. He has NICM s/p ICD with EF 15-25% over the past few years. He sees Dr [name] for CHF and Dr Graciela Husbands for his AICD. At home he is on torsemide 60 mg bid, coreg 12.5 mg bid, spironolactone 12.5 mg daily, imdur 30 mg po daily, hydralazine 25 mg tid, lisinopril 5 mg bid -appreciate cards -cont torsemide 60 mg bid, coreg 12.5 mg bid, spironolactone 12.5 mg daily, lisinopril 5 mg bid -change  imdur 30 mg po daily and hydralazine 25 mg tid to bidil 20-37.5 mg po bid -telemetry -I&O, daily weight  Chest pain: I-stat troponin 0.12 with troponins stable at 0.28 then 0.26 and 0.27. No ischemic changes on EKG. Cardiology saw him and stress test read as high risk due to known low LVEF and while findings consistent with prior infarct, no acute issue. This is likely cocaine induced vasospasm as he used cocaine a few days before presentation and cessation was encouraged. -appreciate cards -cont ASA 81 mg daily -telemetry  Hypokalemia 2/2 Hypomagnesemia: K 3.0 on admission with Mg 1.5, now K 3.3 with Mg 1.6 following supplementation -magnesium sulfate 2 g iv once -KDur 40 mEq -recheck electrolytes on hospital follow-up  Dry Cough: Mr Ridener developed a dry cough yesterday but ROS otherwise negative and lungs CTAB. This could be a viral URI -tessalon 100 mg tidprn -dextromethorphan 15 mg po bidprn -cont to monitor  HTN: BP this am 133/94. At home he is on coreg 12.5 mg bid, hydralazine 25 mg tid, lisinopril 5 mg bid,imdur 30 mg daily, spironolactone 12.5 mg daily, torsemide 60 mg bid, amiodarone 200 mg daily, lipitor 40 mg qhs. -cont coreg 12.5 mg bid,  lisinopril 5 mg bid,  spironolactone 12.5 mg daily, torsemide 60 mg bid, amiodarone 200 mg daily, lipitor 40 mg qhs -change imdur 30 mg po daily and hydralazine 25 mg tid to bidil 20-37.5 mg po bid -telemetry  DM2: Last hemoglobin A1c 7.5 01/29/15. At home he is on metformin 500 mg bid. AM CBG 117. -hold metformin -SSI sensitive  History of NSVT: NSR on EKG. He is s/p ICD and on amiodarone 200 mg daily per cards -cont amiodarone 200 mg daily -telemetry  CKD2: Presenting creatinine 1.27, baseline about 1.3, stable as now 1.34 -cont to monitor  Substance abuse: Mr Aungst admits to cocaine use about four days prior to admission -social work consult  Dispo: today  The patient does have a current PCP [name], MD) and does need an [facility] hospital follow-up appointment after discharge.  The patient does have transportation limitations that hinder transportation to clinic appointments.  .Services Needed at time of discharge: Y = Yes, Blank = No PT:   OT:   RN:   Equipment:   Other:       Lorenda Hatchet, MD 04/23/2015, 10:45 AM

## 2015-04-23 NOTE — Progress Notes (Signed)
Internal Medicine Attending  Date: 04/23/2015  Patient name: Douglas [name] Medical record number: [id number] Date of birth: [birth year] Age: 39 y.o. Gender: male  I saw and evaluated the patient. I reviewed the resident's note by Dr. Valentino Saxon and I agree with the resident's findings and plans as documented in his progress note.  Myoview w/o reversible ischemia.  Clinically, his non-ischemic cardiomyopathy is well compensated.  Agree with discharge home on current regimen.  Importance of cocaine cessation again emphasized.

## 2015-04-23 NOTE — Clinical Social Work Note (Signed)
Clinical Social Work Assessment  Patient Details  Name: Douglas [name] MRN: [id number] Date of Birth: [birth year]  Date of referral:  04/23/15               Reason for consult:  Substance Use/ETOH Abuse                Permission sought to share information with:  Family Supports Permission granted to share information::  Yes, Verbal Permission Granted  Name::     Purcell Nails  Relationship::  Mother  Contact Information:  [phone]  Housing/Transportation Living arrangements for the past 2 months:  Single Family Home Source of Information:  Patient Patient Interpreter Needed:  None Criminal Activity/Legal Involvement Pertinent to Current Situation/Hospitalization:  No - Comment as needed Significant Relationships:  Siblings, Friend, Adult Children Lives with:  Parents Do you feel safe going back to the place where you live?  Yes Need for family participation in patient care:  No (Coment)  Care giving concerns:  No family/friends at bedside at this time.  Patient states that he has good support from his mother and sister upon discharge.   Social Worker assessment / plan:  Holiday representative met with patient at bedside to offer support and inquire about patient current substance use.  Patient states that he lives at home with his mother and is currently unemployed.  Patient cares for his infant grandchild during the day while his daughter works.  Patient states that he is in a probationary period for 6 more months and is required to attend an outpatient program weekly.  Patient states that due to his current schedule, he does not want to comitt to another program.  Patient does recognize his current use as an issue and plans to continue to address the cocaine use upon discharge.  Patient is fully aware of possible consequences of continued use.  Patient states that he now has more things in life to live for and his level of disappointment and frustration has decreased.  Patient  feels that his family is supportive and will assist patient to cease use.  SBIRT completed.  No resources provided at this time.  CSW signing off.  Please reconsult if further needs arise prior to discharge.  Employment status:  Unemployed Forensic scientist:  Medicaid [place] PT Recommendations:  Not assessed at this time Information / Referral to community resources:  Outpatient Substance Abuse Treatment Options  Patient/Family's Response to care:  Patient agreeable that he needs to be more regimented with his current outpatient program and plans to put forth more effort upon discharge.  Patient verbalizes understanding of CSW role and appreciation for support.  Patient with no family present at bedside, however states that family will provide transportation and support and discharge.  Patient/Family's Understanding of and Emotional Response to Diagnosis, Current Treatment, and Prognosis:  Patient realistic about his prognosis and consequences with continued cocaine use.  Patient willing to engage emotionally with current concerns and reasoning for use.  Patient feels that there are adequate resources in place to effectively assist at discharge.  Emotional Assessment Appearance:  Appears stated age Attitude/Demeanor/Rapport:   (Appropriate / Cooperative) Affect (typically observed):  Appropriate, Calm, In denial Orientation:  Oriented to Self, Oriented to Place, Oriented to  Time, Oriented to Situation Alcohol / Substance use:  Illicit Drugs Psych involvement (Current and /or in the community):  No (Comment)  Discharge Needs  Concerns to be addressed:  Substance Abuse Concerns, No discharge needs identified Readmission  within the last 30 days:  No Current discharge risk:  None Barriers to Discharge:  No Barriers Identified  Barbette Or, [place]

## 2015-04-28 ENCOUNTER — Telehealth: Payer: Self-pay | Admitting: Internal Medicine

## 2015-04-28 ENCOUNTER — Ambulatory Visit (INDEPENDENT_AMBULATORY_CARE_PROVIDER_SITE_OTHER): Payer: Medicaid Other | Admitting: Internal Medicine

## 2015-04-28 ENCOUNTER — Encounter: Payer: Self-pay | Admitting: Internal Medicine

## 2015-04-28 ENCOUNTER — Telehealth: Payer: Self-pay | Admitting: *Deleted

## 2015-04-28 VITALS — BP 131/70 | HR 70 | Temp 98.3°F | Ht 76.0 in | Wt 334.5 lb

## 2015-04-28 DIAGNOSIS — J309 Allergic rhinitis, unspecified: Secondary | ICD-10-CM

## 2015-04-28 DIAGNOSIS — I5042 Chronic combined systolic (congestive) and diastolic (congestive) heart failure: Secondary | ICD-10-CM

## 2015-04-28 MED ORDER — CETIRIZINE HCL 10 MG PO TABS: 10.0000 mg | ORAL_TABLET | Freq: Every day | ORAL | Status: DC

## 2015-04-28 MED ORDER — FLUTICASONE PROPIONATE 50 MCG/ACT NA SUSP: 2.0000 | Freq: Every day | NASAL | Status: DC

## 2015-04-28 MED ORDER — METOLAZONE 2.5 MG PO TABS: 2.5000 mg | ORAL_TABLET | ORAL | Status: DC | PRN

## 2015-04-28 NOTE — Patient Instructions (Signed)
Thank you for your visit today.   Please return to the internal medicine clinic for your hospital follow-up appointment.    Your current medical regimen is effective;  continue present plan and take all medications as prescribed.    I have given you a flonase which is a steroid inhaler that use should use daily for allergies.  I have also given you a prescription for zyrtec that is also for allergies.  Please let me know in several days if this does not improve your cough and congestion.    Please be sure to bring all of your medications with you to every visit; this includes herbal supplements, vitamins, eye drops, and any over-the-counter medications.   Should you have any questions regarding your medications and/or any new or worsening symptoms, please be sure to call the clinic at [phone].   If you believe that you are suffering from a life threatening condition or one that may result in the loss of limb or function, then you should call 911 or proceed to the nearest Emergency Department.     A healthy lifestyle and preventative care can promote health and wellness.   Maintain regular health, dental, and eye exams.  Eat a healthy diet. Foods like vegetables, fruits, whole grains, low-fat dairy products, and lean protein foods contain the nutrients you need without too many calories. Decrease your intake of foods high in solid fats, added sugars, and salt. Get information about a proper diet from your caregiver, if necessary.  Regular physical exercise is one of the most important things you can do for your health. Most adults should get at least 150 minutes of moderate-intensity exercise (any activity that increases your heart rate and causes you to sweat) each week. In addition, most adults need muscle-strengthening exercises on 2 or more days a week.   Maintain a healthy weight. The body mass index (BMI) is a screening tool to identify possible weight problems. It provides an  estimate of body fat based on height and weight. Your caregiver can help determine your BMI, and can help you achieve or maintain a healthy weight. For adults 20 years and older:  A BMI below 18.5 is considered underweight.  A BMI of 18.5 to 24.9 is normal.  A BMI of 25 to 29.9 is considered overweight.  A BMI of 30 and above is considered obese.   Allergic Rhinitis Allergic rhinitis is when the mucous membranes in the nose respond to allergens. Allergens are particles in the air that cause your body to have an allergic reaction. This causes you to release allergic antibodies. Through a chain of events, these eventually cause you to release histamine into the blood stream. Although meant to protect the body, it is this release of histamine that causes your discomfort, such as frequent sneezing, congestion, and an itchy, runny nose.  CAUSES  Seasonal allergic rhinitis (hay fever) is caused by pollen allergens that may come from grasses, trees, and weeds. Year-round allergic rhinitis (perennial allergic rhinitis) is caused by allergens such as house dust mites, pet dander, and mold spores.  SYMPTOMS   Nasal stuffiness (congestion).  Itchy, runny nose with sneezing and tearing of the eyes. DIAGNOSIS  Your health care provider can help you determine the allergen or allergens that trigger your symptoms. If you and your health care provider are unable to determine the allergen, skin or blood testing may be used. TREATMENT  Allergic rhinitis does not have a cure, but it can be controlled by:  Medicines and allergy shots (immunotherapy).  Avoiding the allergen. Hay fever may often be treated with antihistamines in pill or nasal spray forms. Antihistamines block the effects of histamine. There are over-the-counter medicines that may help with nasal congestion and swelling around the eyes. Check with your health care provider before taking or giving this medicine.  If avoiding the allergen or the  medicine prescribed do not work, there are many new medicines your health care provider can prescribe. Stronger medicine may be used if initial measures are ineffective. Desensitizing injections can be used if medicine and avoidance does not work. Desensitization is when a patient is given ongoing shots until the body becomes less sensitive to the allergen. Make sure you follow up with your health care provider if problems continue. HOME CARE INSTRUCTIONS It is not possible to completely avoid allergens, but you can reduce your symptoms by taking steps to limit your exposure to them. It helps to know exactly what you are allergic to so that you can avoid your specific triggers. SEEK MEDICAL CARE IF:   You have a fever.  You develop a cough that does not stop easily (persistent).  You have shortness of breath.  You start wheezing.  Symptoms interfere with normal daily activities. Document Released: 07/27/2001 Document Revised: 11/06/2013 Document Reviewed: 07/09/2013 [facility] Patient Information 2015 [place], [place]. This information is not intended to replace advice given to you by your health care provider. Make sure you discuss any questions you have with your health care provider.

## 2015-04-28 NOTE — Telephone Encounter (Signed)
Tried to call pt and no answer - went to voice mail. I think pt needs to be seen. EF 16%, h/o substance use, renal insuff - all making vol overload likely. He needs to be seen today or tomorrow. Pls ask him if he is checking daily weight and taking his diuretics.   Thanks

## 2015-04-28 NOTE — Telephone Encounter (Signed)
Pt called with c/o cough and congestion.  He has been using cough drops and coriciden without relief.  He has a hard time sleeping with cough,  During the day he is better but while sleeping the cough increases.  He had the cough while in hospital and told it was from a virus. Other than the cough he feels good.  He was given a Rx for tessalon pearls that was not filled.  I advised that he try them. Hospital admission 6/5 - 6/8 for chest pain.  He has a f/u in clinic on 6/22 Pt # [phone]

## 2015-04-28 NOTE — Telephone Encounter (Signed)
Call to patient to follow up from hospital stay. °

## 2015-04-28 NOTE — Assessment & Plan Note (Signed)
Pt reports his weight is actually down according to his home scales and weighs himself daily.  He reports compliance with his meds.  He denies CP, SOB, orthopnea, or LE swelling.   -cont current meds

## 2015-04-28 NOTE — Progress Notes (Signed)
Patient ID: Douglas [name], male   DOB: 20-Nov-1975, 39 y.o.   MRN: [id number]     Subjective:   Patient ID: Douglas [name] male    DOB: 1976/05/17 39 y.o.    MRN: [id number] Health Maintenance Due: Health Maintenance Due  Topic Date Due  . URINE MICROALBUMIN  10/10/1984    _________________________________________________  HPI: [name] is a 39 y.o. male here for an acute visit.  Pt has a PMH outlined below.  Please see problem-based charting assessment and plan note for further details of medical issues addressed at today's visit.  PMH: Past Medical History  Diagnosis Date  . Hypertension   . Systolic CHF, chronic   . Obesity   . Nonischemic cardiomyopathy     a.  echo 4/06: EF 30%, mild to mod MR, mild RAE, inf HK, lat HK , ant HK;    b.  cath 4/06: no CAD, EF 20-25%  . ED (erectile dysfunction)   . NSVT (nonsustained ventricular tachycardia)     eval by EP in past; no ICD candidate due to NYHA 1 symptoms  . Automatic implantable cardioverter-defibrillator in situ   . Exertional shortness of breath   . Arthritis     ra    Medications: Current Outpatient Prescriptions on File Prior to Visit  Medication Sig Dispense Refill  . acetaminophen (TYLENOL) 500 MG tablet Take 500 mg by mouth every 6 (six) hours as needed for mild pain.    Marland Kitchen amiodarone (PACERONE) 200 MG tablet Take 1 tablet (200 mg total) by mouth daily. 30 tablet 5  . aspirin EC 81 MG EC tablet Take 1 tablet (81 mg total) by mouth daily. 30 tablet 0  . atorvastatin (LIPITOR) 40 MG tablet Take 1 tablet (40 mg total) by mouth daily at 6 PM. 30 tablet 5  . benzonatate (TESSALON) 100 MG capsule Take 1 capsule (100 mg total) by mouth 3 (three) times daily as needed for cough. 30 capsule 0  . carvedilol (COREG) 12.5 MG tablet Take 1 tablet (12.5 mg total) by mouth 2 (two) times daily with a meal. 60 tablet 3  . clotrimazole-betamethasone (LOTRISONE) cream Apply 1 application topically 2 (two) times  daily. 45 g 0  . colchicine 0.6 MG tablet Take 1 tablet (0.6 mg total) by mouth 2 (two) times daily. 8 tablet 0  . dextromethorphan (DELSYM) 30 MG/5ML liquid Take 2.5 mLs (15 mg total) by mouth 2 (two) times daily as needed for cough. 89 mL 0  . isosorbide-hydrALAZINE (BIDIL) 20-37.5 MG per tablet Take 1 tablet by mouth 2 (two) times daily. 60 tablet 0  . lisinopril (PRINIVIL,ZESTRIL) 5 MG tablet Take 1 tablet (5 mg total) by mouth 2 (two) times daily. 60 tablet 3  . metFORMIN (GLUCOPHAGE) 500 MG tablet Take 1 tablet (500 mg total) by mouth 2 (two) times daily with a meal. 60 tablet 5  . metolazone (ZAROXOLYN) 2.5 MG tablet Take 1 tablet (2.5 mg total) by mouth as needed. (Patient not taking: Reported on 02/24/2015) 8 tablet 3  . spironolactone (ALDACTONE) 25 MG tablet Take 0.5 tablets (12.5 mg total) by mouth daily. 30 tablet 5  . torsemide (DEMADEX) 20 MG tablet Take 3 tablets (60 mg total) by mouth 2 (two) times daily. 180 tablet 5   No current facility-administered medications on file prior to visit.    Allergies: Allergies  Allergen Reactions  . Penicillins Other (See Comments)    Unknown childhood reaction     FH:  Family History  Problem Relation Age of Onset  . Coronary artery disease Mother 80    s/p PCI  . Lung cancer Father   . Diabetes type II Maternal Uncle   . Coronary artery disease Maternal Uncle   . Stroke Neg Hx   . Heart attack Neg Hx     SH: History   Social History  . Marital Status: Legally Separated    Spouse Name: N/A  . Number of Children: 5  . Years of Education: N/A   Occupational History  . unemployed    Social History Main Topics  . Smoking status: Current Every Day Smoker -- 0.12 packs/day for 8 years    Types: Cigarettes  . Smokeless tobacco: Never Used  . Alcohol Use: 0.0 oz/week    0 Standard drinks or equivalent per week     Comment: 08/23/2013  "rarely have a beer anymore; last beer was a long time ago"  . Drug Use: Yes    Special:  Cocaine     Comment: 11/30/14 today,  used cocaine one week ago  . Sexual Activity: Not Currently   Other Topics Concern  . None   Social History Narrative    Review of Systems: Constitutional: Negative for fever, chills and weight loss.  Eyes: Negative for blurred vision.  Respiratory: +cough and negative shortness of breath.  Cardiovascular: Negative for chest pain, palpitations and leg swelling.  Gastrointestinal: Negative for nausea, vomiting, abdominal pain, diarrhea, constipation and blood in stool.  Genitourinary: Negative for dysuria, urgency and frequency.  Musculoskeletal: Negative for myalgias and back pain.  Neurological: Negative for dizziness, weakness and headaches.     Objective:   Vital Signs: Filed Vitals:   04/28/15 1606  BP: 131/70  Pulse: 70  Temp: 98.3 F (36.8 C)  TempSrc: Oral  Height: 6\' 4"  (1.93 m)  Weight: 334 lb 8 oz (151.728 kg)  SpO2: 97%      BP Readings from Last 3 Encounters:  04/28/15 131/70  04/23/15 113/62  03/24/15 135/76    Physical Exam: Constitutional: Vital signs reviewed.  Patient is in NAD and cooperative with exam.  Head: Normocephalic and atraumatic. Eyes: PERRL, EOMI, conjunctivae nl, no scleral icterus.  Ears: Cerumen b/l unable to visualize TMs.   Nose: Clear drainage, otherwise wnl. Throat: Mild erythema, no exudates, no tonsillar enlargement.  Neck: Supple. No lymphadenopathy.  Cardiovascular: RRR, no MRG. Pulmonary/Chest: normal effort, soft bibasilar crackles, no wheezes or rhonchi. Abdominal: Soft. NT/ND +BS. Neurological: A&O x3, cranial nerves II-XII are grossly intact, moving all extremities. Extremities: 2+DP b/l; trace pitting edema. Skin: Warm, dry and intact. No rash.   Assessment & Plan:   Assessment and plan was discussed and formulated with my attending.

## 2015-04-28 NOTE — Assessment & Plan Note (Addendum)
Pt p/w dry cough (sometimes productive), post-nasal drip for 1 week.  Denies fever/chills, CP, SOB, leg swelling.  Feels very congested.  No history of allergies that he is aware of.  Does not appear volume overloaded.   -flonase 2 sprays each nostril daily -zyrtec 10mg  daily  -return in ~1 week if symptoms are worsening or experience increased SOB  -may also try saline nasal spray prior to using flonase

## 2015-04-28 NOTE — Telephone Encounter (Signed)
Pt will come in today at 3:45

## 2015-04-28 NOTE — Telephone Encounter (Signed)
Pt called back verified f/u appt and was sent to nurse for concerns with a cough and chest congestion.

## 2015-05-02 NOTE — Progress Notes (Signed)
Internal Medicine Clinic Attending  Case discussed with Dr. Gill soon after the resident saw the patient.  We reviewed the resident's history and exam and pertinent patient test results.  I agree with the assessment, diagnosis, and plan of care documented in the resident's note.  

## 2015-05-07 ENCOUNTER — Encounter: Payer: Self-pay | Admitting: Internal Medicine

## 2015-05-07 ENCOUNTER — Ambulatory Visit (INDEPENDENT_AMBULATORY_CARE_PROVIDER_SITE_OTHER): Payer: Medicaid Other | Admitting: Internal Medicine

## 2015-05-07 VITALS — BP 116/78 | HR 73 | Temp 98.0°F | Ht 76.0 in | Wt 341.6 lb

## 2015-05-07 DIAGNOSIS — I5042 Chronic combined systolic (congestive) and diastolic (congestive) heart failure: Secondary | ICD-10-CM | POA: Diagnosis present

## 2015-05-07 DIAGNOSIS — Z7982 Long term (current) use of aspirin: Secondary | ICD-10-CM | POA: Diagnosis not present

## 2015-05-07 DIAGNOSIS — J309 Allergic rhinitis, unspecified: Secondary | ICD-10-CM | POA: Diagnosis not present

## 2015-05-07 DIAGNOSIS — N289 Disorder of kidney and ureter, unspecified: Secondary | ICD-10-CM | POA: Diagnosis not present

## 2015-05-07 DIAGNOSIS — Z79899 Other long term (current) drug therapy: Secondary | ICD-10-CM | POA: Diagnosis not present

## 2015-05-07 DIAGNOSIS — J301 Allergic rhinitis due to pollen: Secondary | ICD-10-CM

## 2015-05-07 LAB — BASIC METABOLIC PANEL WITH GFR
BUN: 18 mg/dL (ref 6–23)
CO2: 30 meq/L (ref 19–32)
Calcium: 8.4 mg/dL (ref 8.4–10.5)
Chloride: 98 mEq/L (ref 96–112)
Creat: 1.3 mg/dL (ref 0.50–1.35)
GFR, Est African American: 79 mL/min
GFR, Est Non African American: 68 mL/min
GLUCOSE: 166 mg/dL — AB (ref 70–99)
POTASSIUM: 3.5 meq/L (ref 3.5–5.3)
Sodium: 139 mEq/L (ref 135–145)

## 2015-05-07 MED ORDER — METOLAZONE 2.5 MG PO TABS: 2.5000 mg | ORAL_TABLET | ORAL | Status: DC | PRN

## 2015-05-07 NOTE — Assessment & Plan Note (Signed)
Most recent Cr 1.34 on discharge on 04/23/15. Taking Torsemide 60 mg bid.  -Recheck BMP given increased weight and metolazone administration.

## 2015-05-07 NOTE — Progress Notes (Signed)
Subjective:   Patient ID: Douglas [name] male   DOB: 04/16/1976 39 y.o.   MRN: [id number]  HPI: [name] is a 39 y.o. 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 Patient was seen in clinic on 04/28/15 for cough at which time it was thought to be related to allergy symptoms. Today, the patient states the cough has completely resolved. He did have some right-sided chest discomfort related to his cough, however, this too has improved. Today, patient's weight has seemed to increase despite Torsemide 60 mg bid. Today he is 341 lbs, increased from 326 lbs since 04/23/15. Patient denies SOB, chest pain, DOE, PND, orthopnea, or worsening LE swelling. Had recent admission for CHF exacerbation.   Current Outpatient Prescriptions  Medication Sig Dispense Refill  . acetaminophen (TYLENOL) 500 MG tablet Take 500 mg by mouth every 6 (six) hours as needed for mild pain.    Marland Kitchen amiodarone (PACERONE) 200 MG tablet Take 1 tablet (200 mg total) by mouth daily. 30 tablet 5  . aspirin EC 81 MG EC tablet Take 1 tablet (81 mg total) by mouth daily. 30 tablet 0  . atorvastatin (LIPITOR) 40 MG tablet Take 1 tablet (40 mg total) by mouth daily at 6 PM. 30 tablet 5  . benzonatate (TESSALON) 100 MG capsule Take 1 capsule (100 mg total) by mouth 3 (three) times daily as needed for cough. 30 capsule 0  . carvedilol (COREG) 12.5 MG tablet Take 1 tablet (12.5 mg total) by mouth 2 (two) times daily with a meal. 60 tablet 3  . cetirizine (ZYRTEC) 10 MG tablet Take 1 tablet (10 mg total) by mouth daily. as needed for allergies. 30 tablet 0  . clotrimazole-betamethasone (LOTRISONE) cream Apply 1 application topically 2 (two) times daily. 45 g 0  . colchicine 0.6 MG tablet Take 1 tablet (0.6 mg total) by mouth 2 (two) times daily. 8 tablet 0  . dextromethorphan (DELSYM) 30 MG/5ML liquid Take 2.5  mLs (15 mg total) by mouth 2 (two) times daily as needed for cough. 89 mL 0  . fluticasone (FLONASE) 50 MCG/ACT nasal spray Place 2 sprays into both nostrils daily. 16 g 1  . isosorbide-hydrALAZINE (BIDIL) 20-37.5 MG per tablet Take 1 tablet by mouth 2 (two) times daily. 60 tablet 0  . lisinopril (PRINIVIL,ZESTRIL) 5 MG tablet Take 1 tablet (5 mg total) by mouth 2 (two) times daily. 60 tablet 3  . metFORMIN (GLUCOPHAGE) 500 MG tablet Take 1 tablet (500 mg total) by mouth 2 (two) times daily with a meal. 60 tablet 5  . metolazone (ZAROXOLYN) 2.5 MG tablet Take 1 tablet (2.5 mg total) by mouth as needed. 30 tablet 0  . spironolactone (ALDACTONE) 25 MG tablet Take 0.5 tablets (12.5 mg total) by mouth daily. 30 tablet 5  . torsemide (DEMADEX) 20 MG tablet Take 3 tablets (60 mg total) by mouth 2 (two) times daily. 180 tablet 5   No current facility-administered medications for this visit.   Review of Systems  General: Denies fever, diaphoresis, appetite change, and fatigue.  Respiratory: Denies SOB, cough, and wheezing.   Cardiovascular: Denies chest pain and palpitations.  Gastrointestinal: Denies nausea, vomiting, abdominal pain, and diarrhea Musculoskeletal: Denies myalgias, arthralgias, back pain, and gait problem.  Neurological: Denies dizziness, syncope, weakness, lightheadedness, and headaches.  Psychiatric/Behavioral: Denies mood changes, sleep disturbance, and agitation.  Objective:   Physical Exam: Filed Vitals:   05/07/15 1045  BP: 116/78  Pulse: 73  Temp: 98 F (36.7 C)  TempSrc: Oral  Height: 6\' 4"  (1.93 m)  Weight: 341 lb 9.6 oz (154.949 kg)  SpO2: 100%    General: Obese AA male, alert, cooperative, NAD. HEENT: PERRL, EOMI. Moist mucus membranes Neck: Full range of motion without pain, supple, no lymphadenopathy or carotid bruits Lungs: Clear to ascultation bilaterally, normal work of respiration, no wheezes, rales, rhonchi Heart: RRR, no murmurs, gallops, or  rubs Abdomen: Soft, non-tender, non-distended, BS + Extremities: No cyanosis, clubbing, or edema Neurologic: Alert & oriented X3, cranial nerves II-XII intact, strength grossly intact, sensation intact to light touch   Assessment & Plan:   Please see problem based assessment and plan.

## 2015-05-07 NOTE — Progress Notes (Signed)
INTERNAL MEDICINE TEACHING ATTENDING ADDENDUM - Kyra Laffey, MD: I reviewed and discussed at the time of visit with the resident Dr. Jones, the patient's medical history, physical examination, diagnosis and results of pertinent tests and treatment and I agree with the patient's care as documented.  

## 2015-05-07 NOTE — Assessment & Plan Note (Signed)
Cough, rhinorrhea, and allergy symptoms significantly improved today.  -Continue Flonase + Zyrtec

## 2015-05-07 NOTE — Patient Instructions (Signed)
1. Please schedule a follow up for 2 weeks.   2. Please take all medications as previously prescribed with the following changes:  Take Metolazone 2.5 mg tomorrow to help w/ fluid overload.   Continue Torsemide 60 mg twice daily.   3. If you have worsening of your symptoms or new symptoms arise, please call the clinic ([phone]), or go to the ER immediately if symptoms are severe.  You have done a great job in taking all your medications. Please continue to do this.

## 2015-05-07 NOTE — Assessment & Plan Note (Addendum)
Patient w/ recent admission for acute on chronic combined CHF. Currently taking Torsemide 60 mg bid, Spironolactone 12.5 mg daily, Lisinopril 5 mg bid, Bidil 20-37.5 mg bid, Coreg 12.5 mg bid, ASA 81 mg daily, and Metolazone 2.5 mg prn for weight gain and fluid retention. Patient's weight is up today, denies SOB, chest pain, DOE, PND, or orthopnea. Patient does have +1 pitting/non-pitting edema, lungs sound clear on exam.  -Continue meds as above -Given weight gain, encouraged patient to take Metoloazone 2.5 mg once today and continue Torsemide 60 mg bid.  -BMP pending -RTC in 2 weeks

## 2015-05-12 ENCOUNTER — Other Ambulatory Visit: Payer: Self-pay

## 2015-05-16 ENCOUNTER — Encounter: Payer: Self-pay | Admitting: *Deleted

## 2015-05-22 ENCOUNTER — Encounter: Payer: Self-pay | Admitting: Nurse Practitioner

## 2015-05-30 ENCOUNTER — Other Ambulatory Visit (HOSPITAL_COMMUNITY): Payer: Self-pay | Admitting: Internal Medicine

## 2015-06-06 ENCOUNTER — Encounter: Payer: Medicaid Other | Admitting: Nurse Practitioner

## 2015-06-10 ENCOUNTER — Encounter: Payer: Self-pay | Admitting: *Deleted

## 2015-07-11 ENCOUNTER — Encounter: Payer: Self-pay | Admitting: *Deleted

## 2015-07-29 ENCOUNTER — Other Ambulatory Visit: Payer: Self-pay | Admitting: *Deleted

## 2015-07-29 MED ORDER — ISOSORB DINITRATE-HYDRALAZINE 20-37.5 MG PO TABS: 1.0000 | ORAL_TABLET | Freq: Two times a day (BID) | ORAL | Status: DC

## 2015-07-29 NOTE — Telephone Encounter (Signed)
Last refill 04/23/15

## 2015-08-13 ENCOUNTER — Encounter: Payer: Self-pay | Admitting: *Deleted

## 2015-09-29 ENCOUNTER — Encounter (HOSPITAL_COMMUNITY): Payer: Self-pay | Admitting: Vascular Surgery

## 2015-09-30 ENCOUNTER — Other Ambulatory Visit (HOSPITAL_COMMUNITY): Payer: Self-pay | Admitting: Internal Medicine

## 2015-10-24 ENCOUNTER — Encounter: Payer: Self-pay | Admitting: Internal Medicine

## 2015-11-05 ENCOUNTER — Ambulatory Visit: Payer: Self-pay

## 2015-11-18 ENCOUNTER — Encounter: Payer: Medicaid Other | Admitting: Internal Medicine

## 2015-11-19 ENCOUNTER — Encounter: Payer: Self-pay | Admitting: Internal Medicine

## 2015-11-20 MED FILL — LISINOPRIL 5 MG TABLET: 5 | 30 days supply | Qty: 60 | Fill #1

## 2015-11-20 MED FILL — TORSEMIDE 20 MG TABLET: 20 | 30 days supply | Qty: 180 | Fill #5

## 2015-11-20 MED FILL — AMIODARONE HCL 200 MG TAB: 200 | 30 days supply | Qty: 30 | Fill #4

## 2015-11-20 MED FILL — BIDIL TABLET: 20-37.5 | 30 days supply | Qty: 60 | Fill #1

## 2015-11-20 MED FILL — CARVEDILOL 12.5 MG TABLET: 12.5 | 30 days supply | Qty: 60 | Fill #1

## 2016-01-02 ENCOUNTER — Other Ambulatory Visit: Payer: Self-pay | Admitting: Internal Medicine

## 2016-01-02 ENCOUNTER — Other Ambulatory Visit (HOSPITAL_COMMUNITY): Payer: Self-pay | Admitting: Internal Medicine

## 2016-01-02 MED FILL — CARVEDILOL 12.5 MG TABLET: 12.5 | 30 days supply | Qty: 60 | Fill #2

## 2016-01-02 MED FILL — TORSEMIDE 20 MG TABLET: 20 | 30 days supply | Qty: 180 | Fill #0

## 2016-01-02 MED FILL — BIDIL TABLET: 20-37.5 | 30 days supply | Qty: 60 | Fill #2

## 2016-01-02 MED FILL — AMIODARONE HCL 200 MG TAB: 200 | 30 days supply | Qty: 30 | Fill #5

## 2016-01-05 ENCOUNTER — Encounter: Payer: Self-pay | Admitting: *Deleted

## 2016-01-06 MED FILL — LISINOPRIL 5 MG TABLET: 5 | 30 days supply | Qty: 60 | Fill #0

## 2016-01-08 ENCOUNTER — Telehealth: Payer: Self-pay | Admitting: Internal Medicine

## 2016-01-08 NOTE — Telephone Encounter (Signed)
APPT. CALL REMINDER, PHONE IS OFF

## 2016-01-09 ENCOUNTER — Ambulatory Visit (INDEPENDENT_AMBULATORY_CARE_PROVIDER_SITE_OTHER): Payer: Medicaid Other | Admitting: Internal Medicine

## 2016-01-09 ENCOUNTER — Telehealth: Payer: Self-pay | Admitting: Dietician

## 2016-01-09 ENCOUNTER — Encounter: Payer: Self-pay | Admitting: Dietician

## 2016-01-09 VITALS — BP 122/60 | HR 72 | Temp 98.0°F | Wt 315.5 lb

## 2016-01-09 DIAGNOSIS — E1122 Type 2 diabetes mellitus with diabetic chronic kidney disease: Secondary | ICD-10-CM

## 2016-01-09 DIAGNOSIS — F1490 Cocaine use, unspecified, uncomplicated: Secondary | ICD-10-CM

## 2016-01-09 DIAGNOSIS — E1165 Type 2 diabetes mellitus with hyperglycemia: Secondary | ICD-10-CM

## 2016-01-09 DIAGNOSIS — E785 Hyperlipidemia, unspecified: Secondary | ICD-10-CM | POA: Diagnosis not present

## 2016-01-09 DIAGNOSIS — N182 Chronic kidney disease, stage 2 (mild): Secondary | ICD-10-CM | POA: Diagnosis not present

## 2016-01-09 DIAGNOSIS — I129 Hypertensive chronic kidney disease with stage 1 through stage 4 chronic kidney disease, or unspecified chronic kidney disease: Secondary | ICD-10-CM | POA: Diagnosis not present

## 2016-01-09 DIAGNOSIS — F1420 Cocaine dependence, uncomplicated: Secondary | ICD-10-CM

## 2016-01-09 DIAGNOSIS — Z79899 Other long term (current) drug therapy: Secondary | ICD-10-CM | POA: Diagnosis not present

## 2016-01-09 DIAGNOSIS — I10 Essential (primary) hypertension: Secondary | ICD-10-CM

## 2016-01-09 DIAGNOSIS — Z23 Encounter for immunization: Secondary | ICD-10-CM | POA: Diagnosis not present

## 2016-01-09 DIAGNOSIS — Z0000 Encounter for general adult medical examination without abnormal findings: Secondary | ICD-10-CM

## 2016-01-09 LAB — GLUCOSE, CAPILLARY: Glucose-Capillary: 179 mg/dL — ABNORMAL HIGH (ref 65–99)

## 2016-01-09 LAB — POCT GLYCOSYLATED HEMOGLOBIN (HGB A1C): Hemoglobin A1C: 8.3

## 2016-01-09 LAB — HM DIABETES EYE EXAM

## 2016-01-09 MED ORDER — METFORMIN HCL 1000 MG PO TABS: 1000.0000 mg | ORAL_TABLET | Freq: Two times a day (BID) | ORAL | Status: DC

## 2016-01-09 MED ORDER — ATORVASTATIN CALCIUM 40 MG PO TABS: 40.0000 mg | ORAL_TABLET | Freq: Every day | ORAL | Status: DC

## 2016-01-09 MED ORDER — SPIRONOLACTONE 25 MG PO TABS: 12.5000 mg | ORAL_TABLET | Freq: Every day | ORAL | Status: DC

## 2016-01-09 NOTE — Patient Instructions (Addendum)
-  You have diabetes, your A1c which is the average of your blood sugar for the past 3 months is 8.3 (last time was 8) which is above 7, will start you back on metformin. Take 1 pill everyday, if it doesn't upset your stomach then increase it to twice a day.  -I refilled your lipitor for high cholesterol and spironolactone for high blood pressure/heart failure -Will check your eyes and feet today -Will send you to the foot doctor -Will give you a flu shot today  -Make sure you follow-up with Dr. Jearld Pies next month -Will see you back in 3 months, very nice seeing you!  General Instructions:   Please bring your medicines with you each time you come to clinic.  Medicines may include prescription medications, over-the-counter medications, herbal remedies, eye drops, vitamins, or other pills.   Progress Toward Treatment Goals:  No flowsheet data found.  Self Care Goals & Plans:  Self Care Goal 01/09/2016  Manage my medications take my medicines as prescribed; bring my medications to every visit; refill my medications on time  Monitor my health check my feet daily; keep track of my weight  Eat healthy foods eat foods that are low in salt; drink diet soda or water instead of juice or soda; eat more vegetables; eat baked foods instead of fried foods  Be physically active take a walk every day; find an activity I enjoy  Stop smoking go to the [employer] ([employer]); call [employer] (1-800-QUIT-NOW); cut down the number of cigarettes smoked    No flowsheet data found.   Care Management & Community Referrals:  No flowsheet data found.

## 2016-01-09 NOTE — Progress Notes (Signed)
Patient ID: Douglas [name], male   DOB: November 02, 1976, 40 y.o.   MRN: [id number]    Subjective:   Patient ID: Douglas [name] male   DOB: 04/07/1976 40 y.o.   MRN: [id number]  HPI: [name] is a 40 y.o. [name] is a 40 y.o. pleasant man with past medical history of morbid obesity, CAD s/p PCI, hypertension, hyperlipidemia, non-insulin dependent Type 2 DM, CKD Stage 2, NICM w/ICD, chronic combined CHF (EF 25-30%), ED, and cocaine abuse who presents for follow-up of diabetes.   His A1c was 8 on 04/21/15. He reports taking metformin but stopped due to GI upset. He does not check his blood sugars at home. He has chronic polyuria (in setting of diuretic use) but denies symptomatic hypoglycemia, blurry vision, polydipsia, polyphagia, peripheral neuropathy, or foot injury/ulcer. He tries to follow a healthy diet and exercise. He has lost 21 lb since 11 months ago. He has not had a recent eye exam. He has a corn on his right toe which he would to see podiatry for.    He reports compliance with taking torsemide, lisinopril, carvedilol, and bidil but has run out of spironolactone. He has not had to use metolazone for fluid overload. He denies chest pain, dyspnea, orthopnea, LE edema, headache, or lightheadedness.    He has been out of lipitor for hyperlipidemia.   He continues to use cocaine but is trying to enroll in a drug abuse cessation program this summer.   He would like to have flu shot today.    Past Medical History  Diagnosis Date  . Hypertension   . Systolic CHF, chronic (HCC)   . Obesity   . Nonischemic cardiomyopathy (HCC)     a.  echo 4/06: EF 30%, mild to mod MR, mild RAE, inf HK, lat HK , ant HK;    b.  cath 4/06: no CAD, EF 20-25%  . ED (erectile dysfunction)   . NSVT (nonsustained ventricular tachycardia) (HCC)     eval by EP in past; no ICD candidate due to NYHA 1 symptoms  . Automatic implantable cardioverter-defibrillator in situ   . Exertional  shortness of breath   . Arthritis     ra  . Diabetes mellitus (HCC)    Current Outpatient Prescriptions  Medication Sig Dispense Refill  . amiodarone (PACERONE) 200 MG tablet Take 1 tablet (200 mg total) by mouth daily. 30 tablet 5  . aspirin EC 81 MG EC tablet Take 1 tablet (81 mg total) by mouth daily. 30 tablet 0  . atorvastatin (LIPITOR) 40 MG tablet Take 1 tablet (40 mg total) by mouth daily at 6 PM. 30 tablet 5  . carvedilol (COREG) 12.5 MG tablet TAKE 1 TABLET BY MOUTH TWICE A DAY WITH MEALS 60 tablet 3  . cetirizine (ZYRTEC) 10 MG tablet Take 1 tablet (10 mg total) by mouth daily. as needed for allergies. 30 tablet 0  . fluticasone (FLONASE) 50 MCG/ACT nasal spray Place 2 sprays into both nostrils daily. 16 g 1  . isosorbide-hydrALAZINE (BIDIL) 20-37.5 MG per tablet Take 1 tablet by mouth 2 (two) times daily. 60 tablet 2  . lisinopril (PRINIVIL,ZESTRIL) 5 MG tablet TAKE 1 TABLET BY MOUTH TWICE A DAY 60 tablet 1  . metFORMIN (GLUCOPHAGE) 500 MG tablet Take 1 tablet (500 mg total) by mouth 2 (two) times daily with a meal. 60 tablet 5  . metolazone (ZAROXOLYN) 2.5 MG tablet Take 1 tablet (2.5 mg total) by mouth as needed. 30  tablet 0  . spironolactone (ALDACTONE) 25 MG tablet Take 0.5 tablets (12.5 mg total) by mouth daily. 30 tablet 5  . torsemide (DEMADEX) 20 MG tablet TAKE 3 TABLETS BY MOUTH TWICE DAILY 180 tablet 0   No current facility-administered medications for this visit.   Family History  Problem Relation Age of Onset  . Coronary artery disease Mother 34    s/p PCI  . Lung cancer Father   . Diabetes type II Maternal Uncle   . Coronary artery disease Maternal Uncle   . Stroke Neg Hx   . Heart attack Neg Hx    Social History   Social History  . Marital Status: Legally Separated    Spouse Name: N/A  . Number of Children: 5  . Years of Education: 12   Occupational History  . unemployed    Social History Main Topics  . Smoking status: Current Every Day Smoker --  0.12 packs/day for 8 years    Types: Cigarettes  . Smokeless tobacco: Never Used  . Alcohol Use: 0.0 oz/week    0 Standard drinks or equivalent per week     Comment: 08/23/2013  "rarely have a beer anymore; last beer was a long time ago"  . Drug Use: Yes    Special: Cocaine     Comment: 11/30/14 today,  used cocaine one week ago  . Sexual Activity: Not Currently   Other Topics Concern  . Not on file   Social History Narrative   Review of Systems: Review of Systems  Constitutional: Positive for weight loss (intentional ). Negative for fever and chills.  Eyes: Negative for blurred vision.  Respiratory: Negative for cough, shortness of breath and wheezing.   Cardiovascular: Negative for chest pain, palpitations, orthopnea and leg swelling.  Gastrointestinal: Positive for heartburn and abdominal pain. Negative for nausea, vomiting, diarrhea, constipation and blood in stool.  Genitourinary: Negative for dysuria, urgency, frequency and hematuria.       Chronic polyuria  Musculoskeletal: Negative for myalgias.  Neurological: Negative for dizziness and headaches.  Endo/Heme/Allergies: Negative for polydipsia.  Psychiatric/Behavioral: Positive for substance abuse (cocaine and tobacco).    Objective:  Physical Exam: Filed Vitals:   01/09/16 1333  BP: 122/60  Pulse: 72  Temp: 98 F (36.7 C)  TempSrc: Oral  Weight: 315 lb 8 oz (143.11 kg)  SpO2: 98%    Physical Exam  Constitutional: He is oriented to person, place, and time. He appears well-developed and well-nourished. No distress.  HENT:  Head: Normocephalic and atraumatic.  Right Ear: External ear normal.  Left Ear: External ear normal.  Nose: Nose normal.  Mouth/Throat: Oropharynx is clear and moist. No oropharyngeal exudate.  Eyes: Conjunctivae and EOM are normal. Pupils are equal, round, and reactive to light. Right eye exhibits no discharge. Left eye exhibits no discharge. No scleral icterus.  Neck: Normal range of  motion. Neck supple.  Cardiovascular: Normal rate and regular rhythm.   Distant heart sounds  Pulmonary/Chest: Effort normal and breath sounds normal. No respiratory distress. He has no wheezes. He has no rales.  Abdominal: Soft. Bowel sounds are normal. He exhibits no distension. There is no tenderness. There is no rebound and no guarding.  Musculoskeletal: Normal range of motion. He exhibits no edema or tenderness.  Neurological: He is alert and oriented to person, place, and time.  Skin: Skin is warm and dry. No rash noted. He is not diaphoretic. No erythema. No pallor.  Psychiatric: He has a normal mood and affect.  His behavior is normal. Judgment and thought content normal.    Assessment & Plan:   Please see problem list for problem-based assessment and plan

## 2016-01-09 NOTE — Telephone Encounter (Signed)
Patient and his pharmacies called  as part of project trying to help patient's with a1C between 8-9% lower their blood sugars to target. His phone is not working. He has not filled metformin since June of 2016 for 60 tablets of 500 mg metformin.

## 2016-01-10 ENCOUNTER — Encounter: Payer: Self-pay | Admitting: Internal Medicine

## 2016-01-10 LAB — CMP14 + ANION GAP
ALT: 32 IU/L (ref 0–44)
AST: 29 IU/L (ref 0–40)
Albumin/Globulin Ratio: 1.7 (ref 1.1–2.5)
Albumin: 4.3 g/dL (ref 3.5–5.5)
Alkaline Phosphatase: 85 IU/L (ref 39–117)
Anion Gap: 23 mmol/L — ABNORMAL HIGH (ref 10.0–18.0)
BILIRUBIN TOTAL: 1.1 mg/dL (ref 0.0–1.2)
BUN/Creatinine Ratio: 12 (ref 9–20)
BUN: 15 mg/dL (ref 6–24)
CALCIUM: 9 mg/dL (ref 8.7–10.2)
CO2: 22 mmol/L (ref 18–29)
Chloride: 93 mmol/L — ABNORMAL LOW (ref 96–106)
Creatinine, Ser: 1.26 mg/dL (ref 0.76–1.27)
GFR calc Af Amer: 81 mL/min/{1.73_m2} (ref >59)
GFR, EST NON AFRICAN AMERICAN: 70 mL/min/{1.73_m2} (ref >59)
GLUCOSE: 173 mg/dL — AB (ref 65–99)
Globulin, Total: 2.6 g/dL (ref 1.5–4.5)
POTASSIUM: 3.3 mmol/L — AB (ref 3.5–5.2)
Sodium: 138 mmol/L (ref 134–144)
Total Protein: 6.9 g/dL (ref 6.0–8.5)

## 2016-01-10 LAB — LIPID PANEL
CHOLESTEROL TOTAL: 185 mg/dL (ref 100–199)
Chol/HDL Ratio: 5.3 ratio units — ABNORMAL HIGH (ref 0.0–5.0)
HDL: 35 mg/dL — AB (ref >39)
LDL Calculated: 114 mg/dL — ABNORMAL HIGH (ref 0–99)
Triglycerides: 180 mg/dL — ABNORMAL HIGH (ref 0–149)
VLDL Cholesterol Cal: 36 mg/dL (ref 5–40)

## 2016-01-10 LAB — MICROALBUMIN / CREATININE URINE RATIO
Creatinine, Urine: 117.6 mg/dL
MICROALB/CREAT RATIO: 117.8 mg/g{creat} — AB (ref 0.0–30.0)
Microalbumin, Urine: 138.5 ug/mL

## 2016-01-10 NOTE — Assessment & Plan Note (Signed)
Pt received annual influenza vaccine today on 01/09/16

## 2016-01-10 NOTE — Assessment & Plan Note (Signed)
Assessment: Pt with history of cocaine use who presents with ongoing use.   Plan: -Pt counseled on cessation  -Pt reports he is to enroll in a drug abuse program in the coming months

## 2016-01-10 NOTE — Assessment & Plan Note (Addendum)
Assessment: Pt with last A1c of 8 on 04/21/15 non-compliant with medical therapy who no episodes of hypoglycemia who presents with CBG of 179 and slightly worsened A1c of 8.3.  Plan:  -A1c 8.3  not at goal <7, prescribe metformin 1000 mg BID (to start taking 1000 mg daily and if able to tolerate, increase to BID after 1 week)  -BP 122/60 at goal <140/90, restart spironolactone 12.5 mg daily and continue lisinopril 5 mg BID, carvedilol 12.5 mg BID, imdur 20-37.5 mg daily, and torsemide 60 mg BID  -Obtain annual lipid panel, LDL 114 not at goal <100, restart atorvastatin 40 mg daily  -Perform annual eye exam and foot exams today  -Obtain anual urine microalbumin  --> 117.8 mg of proteinuria, continue lisinopril 5 mg BID -Refer to podiatry for nail care  -BMI 38.42 not at goal <30, encourage weight loss -Continue aspirin 81 mg daily for primary CVD prevention

## 2016-01-10 NOTE — Assessment & Plan Note (Addendum)
Assessment: Pt with well-controlled hypertension partially compliant with four-class (BB, ACEi, diuretic, nitrate) anti-hypertensive therapy who presents with blood pressure of 122/60.  Plan:  -BP 122/60 at goal <140/90 -Restart spironolactone 12.5 mg daily  -Continue lisinopril 5 mg BID, carvedilol 12.5 mg BID, and bidil 20-37.5 mg daily -Continue metolazone 2.5 mg PRN fluid overload  -Obtain CMP --> K 3.3 however restarting spironolactone and stable CKD Stage 2

## 2016-01-10 NOTE — Assessment & Plan Note (Addendum)
Assessment: Pt with last lipid panel on 12/10/14 with LDL 154 noncompliant with statin therapy with recommendations to restart high-intensity therapy due to being in statin benefit group (ASCVD, DM).   Plan:  -Obtain annual lipid panel ---> LDL improved to 114 -Obtain CMP to assess liver function ---> normal -Restart atorvastatin 40 mg daily  -Continue lifestyle modification  -Monitor for myalgias and myositis

## 2016-01-12 NOTE — Progress Notes (Signed)
Internal Medicine Clinic Attending  Case discussed with Dr. Rabbani soon after the resident saw the patient.  We reviewed the resident's history and exam and pertinent patient test results.  I agree with the assessment, diagnosis, and plan of care documented in the resident's note.  

## 2016-01-15 ENCOUNTER — Encounter: Payer: Self-pay | Admitting: Dietician

## 2016-01-21 ENCOUNTER — Encounter: Payer: Self-pay | Admitting: *Deleted

## 2016-01-22 ENCOUNTER — Encounter (HOSPITAL_COMMUNITY): Payer: Self-pay

## 2016-01-26 ENCOUNTER — Other Ambulatory Visit: Payer: Self-pay | Admitting: Internal Medicine

## 2016-01-26 DIAGNOSIS — E1122 Type 2 diabetes mellitus with diabetic chronic kidney disease: Secondary | ICD-10-CM

## 2016-01-26 DIAGNOSIS — R55 Syncope and collapse: Secondary | ICD-10-CM

## 2016-01-26 DIAGNOSIS — N182 Chronic kidney disease, stage 2 (mild): Secondary | ICD-10-CM

## 2016-01-26 MED ORDER — LISINOPRIL 5 MG PO TABS: 5.0000 mg | ORAL_TABLET | Freq: Two times a day (BID) | ORAL | Status: DC

## 2016-01-26 MED ORDER — ISOSORB DINITRATE-HYDRALAZINE 20-37.5 MG PO TABS: 1.0000 | ORAL_TABLET | Freq: Two times a day (BID) | ORAL | Status: DC

## 2016-01-26 MED ORDER — METFORMIN HCL 1000 MG PO TABS: 1000.0000 mg | ORAL_TABLET | Freq: Two times a day (BID) | ORAL | Status: DC

## 2016-01-26 MED ORDER — TORSEMIDE 20 MG PO TABS: 60.0000 mg | ORAL_TABLET | Freq: Two times a day (BID) | ORAL | Status: DC

## 2016-01-26 MED ORDER — AMIODARONE HCL 200 MG PO TABS: 200.0000 mg | ORAL_TABLET | Freq: Every day | ORAL | Status: DC

## 2016-01-26 MED ORDER — CARVEDILOL 12.5 MG PO TABS: 12.5000 mg | ORAL_TABLET | Freq: Two times a day (BID) | ORAL | Status: DC

## 2016-01-26 NOTE — Telephone Encounter (Signed)
Pt asking to get a 2 supply of medication because he is going out of town. He needs approval from dr for pharmacy to fil 2 montsh at a time. Pt is leaving out of town on wednesday  metFORMIN (GLUCOPHAGE) 1000 MG tablet  lisinopril (PRINIVIL,ZESTRIL) 5 MG tablet  torsemide (DEMADEX) 20 MG tablet  isosorbide-hydrALAZINE (BIDIL) 20-37.5 MG per tablet  amiodarone (PACERONE) 200 MG tablet  carvedilol (COREG) 12.5 MG tablet

## 2016-01-26 NOTE — Telephone Encounter (Signed)
Can you please send with note "ok to fill 2 months"

## 2016-01-29 MED FILL — CARVEDILOL 12.5 MG TABLET: 12.5 | 30 days supply | Qty: 60 | Fill #3

## 2016-01-29 MED FILL — LISINOPRIL 5 MG TABLET: 5 | 30 days supply | Qty: 60 | Fill #1

## 2016-01-29 MED FILL — TORSEMIDE 20 MG TABLET: 20 | 30 days supply | Qty: 180 | Fill #0

## 2016-01-29 MED FILL — BIDIL TABLET: 20-37.5 | 30 days supply | Qty: 60 | Fill #0

## 2016-01-29 MED FILL — AMIODARONE HCL 200 MG TAB: 200 | 30 days supply | Qty: 30 | Fill #0

## 2016-02-04 NOTE — Addendum Note (Signed)
Addended by: Bufford Spikes on: 02/04/2016 09:14 AM   Modules accepted: Orders

## 2016-02-20 NOTE — Addendum Note (Signed)
Addended by: Neomia Dear on: 02/20/2016 05:58 PM   Modules accepted: Orders

## 2016-03-01 MED FILL — TORSEMIDE 20 MG TABLET: 20 | 30 days supply | Qty: 180 | Fill #1

## 2016-03-01 MED FILL — LISINOPRIL 5 MG TABLET: 5 | 30 days supply | Qty: 60 | Fill #0

## 2016-03-01 MED FILL — BIDIL TABLET: 20-37.5 | 30 days supply | Qty: 60 | Fill #1

## 2016-03-01 MED FILL — AMIODARONE HCL 200 MG TAB: 200 | 30 days supply | Qty: 30 | Fill #1

## 2016-03-01 MED FILL — CARVEDILOL 12.5 MG TABLET: 12.5 | 30 days supply | Qty: 60 | Fill #0

## 2016-04-29 ENCOUNTER — Ambulatory Visit (INDEPENDENT_AMBULATORY_CARE_PROVIDER_SITE_OTHER): Payer: Medicaid Other | Admitting: *Deleted

## 2016-04-29 DIAGNOSIS — I429 Cardiomyopathy, unspecified: Secondary | ICD-10-CM | POA: Diagnosis not present

## 2016-04-29 DIAGNOSIS — I428 Other cardiomyopathies: Secondary | ICD-10-CM

## 2016-05-03 NOTE — Progress Notes (Signed)
Remote ICD transmission.   

## 2016-05-04 LAB — CUP PACEART REMOTE DEVICE CHECK
Brady Statistic RV Percent Paced: 0.01 %
Date Time Interrogation Session: 20170615211339
HIGH POWER IMPEDANCE MEASURED VALUE: 54 Ohm
HIGH POWER IMPEDANCE MEASURED VALUE: 70 Ohm
Implantable Lead Implant Date: 20141009
Lead Channel Impedance Value: 342 Ohm
Lead Channel Pacing Threshold Amplitude: 0.625 V
Lead Channel Pacing Threshold Pulse Width: 0.4 ms
Lead Channel Sensing Intrinsic Amplitude: 10.375 mV
Lead Channel Sensing Intrinsic Amplitude: 10.375 mV
MDC IDC LEAD LOCATION: 753860
MDC IDC LEAD MODEL: 7121
MDC IDC MSMT BATTERY REMAINING LONGEVITY: 117 mo
MDC IDC MSMT BATTERY VOLTAGE: 3.01 V
MDC IDC MSMT LEADCHNL RV IMPEDANCE VALUE: 437 Ohm
MDC IDC SET LEADCHNL RV PACING AMPLITUDE: 2.5 V
MDC IDC SET LEADCHNL RV PACING PULSEWIDTH: 0.4 ms
MDC IDC SET LEADCHNL RV SENSING SENSITIVITY: 0.45 mV

## 2016-05-05 ENCOUNTER — Encounter: Payer: Self-pay | Admitting: Cardiology

## 2016-05-10 ENCOUNTER — Ambulatory Visit: Payer: Self-pay | Admitting: Internal Medicine

## 2016-05-11 ENCOUNTER — Encounter: Payer: Self-pay | Admitting: *Deleted

## 2016-05-20 ENCOUNTER — Encounter: Payer: Self-pay | Admitting: Cardiology

## 2016-05-27 MED FILL — TORSEMIDE 20 MG TABLET: 20 | 30 days supply | Qty: 180 | Fill #2

## 2016-05-31 NOTE — Progress Notes (Signed)
Referred to ICM clinic by Acie Fredrickson device nurse/Dr Graciela Husbands.  Spoke with patient and he agreed to monthly ICM calls. Provided phone number and encouraged to call for fluid levels.  He has appointment with HF clinic tomorrow.  1st ICM remote transmission scheduled for 06/17/2016.  He gave permission to leave detailed message on phone.

## 2016-06-01 ENCOUNTER — Encounter (HOSPITAL_COMMUNITY): Payer: Self-pay

## 2016-06-01 ENCOUNTER — Ambulatory Visit (HOSPITAL_COMMUNITY)
Admission: RE | Admit: 2016-06-01 | Discharge: 2016-06-01 | Disposition: A | Discharge status: Home / Self Care | Payer: Medicaid Other | Source: Ambulatory Visit | Attending: Cardiology | Admitting: Cardiology

## 2016-06-01 VITALS — BP 132/80 | HR 68 | Wt 306.2 lb

## 2016-06-01 DIAGNOSIS — E119 Type 2 diabetes mellitus without complications: Secondary | ICD-10-CM | POA: Insufficient documentation

## 2016-06-01 DIAGNOSIS — I5042 Chronic combined systolic (congestive) and diastolic (congestive) heart failure: Secondary | ICD-10-CM | POA: Diagnosis not present

## 2016-06-01 DIAGNOSIS — Z6837 Body mass index (BMI) 37.0-37.9, adult: Secondary | ICD-10-CM | POA: Diagnosis not present

## 2016-06-01 DIAGNOSIS — N529 Male erectile dysfunction, unspecified: Secondary | ICD-10-CM | POA: Insufficient documentation

## 2016-06-01 DIAGNOSIS — E669 Obesity, unspecified: Secondary | ICD-10-CM | POA: Insufficient documentation

## 2016-06-01 DIAGNOSIS — Z8249 Family history of ischemic heart disease and other diseases of the circulatory system: Secondary | ICD-10-CM | POA: Insufficient documentation

## 2016-06-01 DIAGNOSIS — Z95810 Presence of automatic (implantable) cardiac defibrillator: Secondary | ICD-10-CM | POA: Diagnosis not present

## 2016-06-01 DIAGNOSIS — M069 Rheumatoid arthritis, unspecified: Secondary | ICD-10-CM | POA: Diagnosis not present

## 2016-06-01 DIAGNOSIS — Z880 Allergy status to penicillin: Secondary | ICD-10-CM | POA: Insufficient documentation

## 2016-06-01 DIAGNOSIS — Z7982 Long term (current) use of aspirin: Secondary | ICD-10-CM | POA: Insufficient documentation

## 2016-06-01 DIAGNOSIS — Z9119 Patient's noncompliance with other medical treatment and regimen: Secondary | ICD-10-CM | POA: Diagnosis not present

## 2016-06-01 DIAGNOSIS — R600 Localized edema: Secondary | ICD-10-CM | POA: Insufficient documentation

## 2016-06-01 DIAGNOSIS — I5022 Chronic systolic (congestive) heart failure: Secondary | ICD-10-CM | POA: Insufficient documentation

## 2016-06-01 DIAGNOSIS — R0602 Shortness of breath: Secondary | ICD-10-CM | POA: Diagnosis not present

## 2016-06-01 DIAGNOSIS — F1410 Cocaine abuse, uncomplicated: Secondary | ICD-10-CM | POA: Insufficient documentation

## 2016-06-01 DIAGNOSIS — I472 Ventricular tachycardia: Secondary | ICD-10-CM | POA: Insufficient documentation

## 2016-06-01 DIAGNOSIS — I110 Hypertensive heart disease with heart failure: Secondary | ICD-10-CM | POA: Insufficient documentation

## 2016-06-01 DIAGNOSIS — I429 Cardiomyopathy, unspecified: Secondary | ICD-10-CM | POA: Diagnosis not present

## 2016-06-01 LAB — COMPREHENSIVE METABOLIC PANEL
ALBUMIN: 3.9 g/dL (ref 3.5–5.0)
ALK PHOS: 87 U/L (ref 38–126)
ALT: 20 U/L (ref 17–63)
ANION GAP: 10 (ref 5–15)
AST: 19 U/L (ref 15–41)
BILIRUBIN TOTAL: 1 mg/dL (ref 0.3–1.2)
BUN: 6 mg/dL (ref 6–20)
CO2: 29 mmol/L (ref 22–32)
CREATININE: 1.34 mg/dL — AB (ref 0.61–1.24)
Calcium: 8.8 mg/dL — ABNORMAL LOW (ref 8.9–10.3)
Chloride: 100 mmol/L — ABNORMAL LOW (ref 101–111)
GFR calc non Af Amer: >60 mL/min (ref >60)
GLUCOSE: 106 mg/dL — AB (ref 65–99)
POTASSIUM: 3.4 mmol/L — AB (ref 3.5–5.1)
Sodium: 139 mmol/L (ref 135–145)
Total Protein: 8 g/dL (ref 6.5–8.1)

## 2016-06-01 LAB — BRAIN NATRIURETIC PEPTIDE: B NATRIURETIC PEPTIDE 5: 383.2 pg/mL — AB (ref 0.0–100.0)

## 2016-06-01 LAB — TSH: TSH: 1.386 u[IU]/mL (ref 0.350–4.500)

## 2016-06-01 MED ORDER — SACUBITRIL-VALSARTAN 24-26 MG PO TABS: 1.0000 | ORAL_TABLET | Freq: Two times a day (BID) | ORAL | Status: DC

## 2016-06-01 MED ORDER — TORSEMIDE 20 MG PO TABS: ORAL_TABLET | ORAL | Status: DC

## 2016-06-01 MED ORDER — ISOSORB DINITRATE-HYDRALAZINE 20-37.5 MG PO TABS: 1.0000 | ORAL_TABLET | Freq: Three times a day (TID) | ORAL | Status: DC

## 2016-06-01 MED ORDER — POTASSIUM CHLORIDE CRYS ER 20 MEQ PO TBCR: 20.0000 meq | EXTENDED_RELEASE_TABLET | Freq: Every day | ORAL | Status: DC

## 2016-06-01 NOTE — Patient Instructions (Signed)
Increase Bidil to 1 tab Three times a day   Increase Torsemide to 80 mg (4 tabs) Twice daily FOR 3 DAYS ONLY, then take 4 tabs in AM and 3 tabs in PM  Start Potassium 20 meq daily  Stop Lisinopril  Start Entresto 24/26 mg Twice daily, STARTING THUR 7/20 AM  Labs today  Labs in 7-10 days  Your physician recommends that you schedule a follow-up appointment in: 4-6 weeks with echocardiogram

## 2016-06-02 NOTE — Progress Notes (Signed)
Patient ID: Douglas [name], male   DOB: [birth year], 40 y.o.   MRN: [id number]  EP: Dr Graciela Husbands PCP: Dr. Johna Roles Primary Cardiologist: Dr. [name]  History of Present Illness: Douglas [name] is a 40 y.o. male with a h/o HTN, HL, obesity, NICM and chronic systolic heart failure. He is S/P Medtronic ICD due to NICM. Management has been complicated by severe non-compliance.   August 2012 CPX VO2 20  Underwent cath in 2006 which showed normal cors with EF 20-25%.   EF had recovered in late 2013 but has again fallen to 15% per echo 01/20/13.  He was admitted to [facility] 02/21/13 from clinic for low output symptoms and volume overload.  He required IV lasix and metolazone.  He diuresed 19 pounds with discharge weight of 254 pounds.  He had runs of VT therefore he was started on amiodarone per EP and Lifevest was placed at discharge.  He underwent RHC on 02/22/13.  He has not required metolazone.   He was admitted in 1/16 with VT and ICD shock x 2.   He has not been seen in this clinic for > 1 year.  He had been doing ok until the past couple of days.  He has been out of torsemide for 3 days.  He has developed increased dyspnea with moderate exertion such as walking up steps over the last couple of days. No orthopnea/PND.  No chest pain.  No lightheadedness.    Optivol: Fluid index > threshold, decreased impedance.   ECG: NSR, PVC, long QTc  RHC 02/22/13  RA = 12  RV = 49/6/15  PA = 54/29 (41)  PCW = 28 (v= 40)  Fick cardiac output/index = 4.3/1.7  PVR = 2.6 Woods  SVR = 1580 dynes  FA sat = 98%  PA sat = 53%, 54%  Non-invasive BP = 124/86 (97)  RHC 05/03/13 RA = 2  RV = 29/2/2  PA = 26/10 (17)  PCW = 3  Fick cardiac output/index = 6.1/2.3  PVR = 2.3  FA sat = 93%  PA sat = 65%, 67%  ECHO 06/25/13 EF 20-25% ECHO 05/01/14 EF 35-40%, moderately dilated LV, mild LVH, diffuse hypokinesis Echo 6/16 EF 25-30%, mild LVH, moderately dilated LV.    Labs (10/15/13): K 3.9 Creatinine 0.99 Labs  (3/15): K 4, creatinine 1.04, TSH normal, LFTs, normal Labs (12/10/2014): K 4.4 Creatinine 1.42   SH: lives with his Mom. Rare ETOH, smokes rarely. Remote cocaine.  FH: Mom has CAD.    ROS:  Please see the history of present illness.  He denies fevers, chills, melena, hematochezia.  All other systems reviewed and negative.  Past Medical History  Diagnosis Date  . Hypertension   . Systolic CHF, chronic (HCC)   . Obesity   . Nonischemic cardiomyopathy (HCC)     a.  echo 4/06: EF 30%, mild to mod MR, mild RAE, inf HK, lat HK , ant HK;    b.  cath 4/06: no CAD, EF 20-25%  . ED (erectile dysfunction)   . NSVT (nonsustained ventricular tachycardia) (HCC)     eval by EP in past; no ICD candidate due to NYHA 1 symptoms  . Automatic implantable cardioverter-defibrillator in situ   . Exertional shortness of breath   . Arthritis     ra  . Diabetes mellitus (HCC)     Current Outpatient Prescriptions  Medication Sig Dispense Refill  . amiodarone (PACERONE) 200 MG tablet Take 1 tablet (200 mg total) by  mouth daily. 30 tablet 5  . aspirin EC 81 MG EC tablet Take 1 tablet (81 mg total) by mouth daily. 30 tablet 0  . carvedilol (COREG) 12.5 MG tablet Take 1 tablet (12.5 mg total) by mouth 2 (two) times daily with a meal. 60 tablet 3  . cetirizine (ZYRTEC) 10 MG tablet Take 1 tablet (10 mg total) by mouth daily. as needed for allergies. 30 tablet 0  . isosorbide-hydrALAZINE (BIDIL) 20-37.5 MG tablet Take 1 tablet by mouth 3 (three) times daily. 90 tablet 3  . spironolactone (ALDACTONE) 25 MG tablet Take 0.5 tablets (12.5 mg total) by mouth daily. 45 tablet 3  . torsemide (DEMADEX) 20 MG tablet Take 4 tabs in AM and 3 tabs in PM 210 tablet 3  . metolazone (ZAROXOLYN) 2.5 MG tablet Take 1 tablet (2.5 mg total) by mouth as needed. (Patient not taking: Reported on 06/01/2016) 30 tablet 0  . potassium chloride SA (K-DUR,KLOR-CON) 20 MEQ tablet Take 1 tablet (20 mEq total) by mouth daily. 30 tablet 3  .  sacubitril-valsartan (ENTRESTO) 24-26 MG Take 1 tablet by mouth 2 (two) times daily. 60 tablet 3   No current facility-administered medications for this encounter.    Allergies  Allergen Reactions  . Penicillins Other (See Comments)    Unknown childhood reaction     Filed Vitals:   06/01/16 1514  BP: 132/80  Pulse: 68  Weight: 306 lb 4 oz (138.914 kg)  SpO2: 98%    PHYSICAL EXAM: General: Well nourished, well developed, in no acute distress  HEENT: normal Neck: JVP 8-9 cm.  Carotids 2+ bilaterally.  No Bruits  Cardiac:  PMI laterally displaced. Regular rate and rhythm,  Lungs:  clear to auscultation bilaterally, no wheezing, rhonchi or rales Ab: obese. Soft NT.  Non-distended.  No bruits. Ext warm. no cyanosis or clubbing. Trace lower extremity edema.  Skin: warm and dry Psych:normal affect. CNII-XII grossly intact  1) Chronic systolic HF: Nonischemic cardiomyopathy, last echo in 6/16 with EF 25-30%.  S/P Medtronic ICD 08/2013.  NYHA class III symptoms with volume overload by exam and Optivol.  He has been off torsemide for about 3 days.    - Increase torsemide to 80 mg bid x 3 days, then take 80 qam/60 qpm.  Add KCl 20 daily.  - Continue Coreg 12.5 bid.  - Take Bidil tid rather than bid.  - Stop lisinopril, in 36 hours start Entresto 24/26 bid. - Continue spironolactone 12.5 mg daily. - BMET/BNP today and repeat in 10 days.  - Repeat echo.    - Reinforced daily weights, low salt food choices, and limiting fluid intake to < 2 liters per day  2) HTN: BP controlled.   3) VT:  Continue amiodarone 200 mg daily.  CMET/TSH today, needs regular eye exam.  4) Cocaine abuse:  None recently.     Followup in 1 month.  Marca Ancona 06/02/2016

## 2016-06-11 ENCOUNTER — Other Ambulatory Visit (HOSPITAL_COMMUNITY): Payer: Self-pay

## 2016-06-17 ENCOUNTER — Telehealth: Payer: Self-pay

## 2016-06-17 ENCOUNTER — Ambulatory Visit (INDEPENDENT_AMBULATORY_CARE_PROVIDER_SITE_OTHER): Payer: Medicaid Other

## 2016-06-17 DIAGNOSIS — Z95810 Presence of automatic (implantable) cardiac defibrillator: Secondary | ICD-10-CM

## 2016-06-17 DIAGNOSIS — Z0389 Encounter for observation for other suspected diseases and conditions ruled out: Secondary | ICD-10-CM

## 2016-06-17 DIAGNOSIS — I5042 Chronic combined systolic (congestive) and diastolic (congestive) heart failure: Secondary | ICD-10-CM | POA: Diagnosis not present

## 2016-06-17 NOTE — Telephone Encounter (Signed)
Attempted call to patient and detailed message left to send ICM remote transmission.

## 2016-06-18 ENCOUNTER — Telehealth: Payer: Self-pay

## 2016-06-18 NOTE — Telephone Encounter (Signed)
Remote ICM transmission received.  Attempted patient call and left message for return call.   

## 2016-06-18 NOTE — Progress Notes (Signed)
EPIC Encounter for ICM Monitoring  Patient Name: Douglas [name] is a 40 y.o. male Date: 06/18/2016 Primary Care Physican: Nyra Market, MD Primary Cardiologist: Bensimhon Electrophysiologist: Candie Echevaria Weight: unknown     Attempted patient call and unable to reach.  Transmission reviewed.   Thoracic impedance abnormal suggesting fluid accumulation 04/20/2016 to 05/30/2016 and starting 06/13/2016.   On 06/01/2016 patient seen in HF clinic.  He was overloaded on exam and Optivol.  He ran out of Torsemide x 3 days.  Dr Shirlee Latch recommended to increase torsemide to 80 mg bid x 3 days, then take 80 qam/60 qpm and add KCl 20 daily.  LABS: 06/01/2016 Creatinine 1.34, BUN 6, Potassium 3.4, Sodium 139 01/09/2016 Creatinine 1.26, BUN 15, Potassium 3.3, Sodium 138 05/07/2015 Creatinine 1.30, BUN 18, Potassium 3.5, Sodium 139    ICM trend: 06/17/2016     Follow-up plan: ICM clinic phone appointment on 07/05/2016.  Copy of ICM check sent to primary cardiologist and device physician.   Karie Soda, RN 06/18/2016 10:59 AM

## 2016-07-05 ENCOUNTER — Telehealth: Payer: Self-pay

## 2016-07-05 ENCOUNTER — Ambulatory Visit (INDEPENDENT_AMBULATORY_CARE_PROVIDER_SITE_OTHER): Payer: Medicaid Other

## 2016-07-05 DIAGNOSIS — Z95810 Presence of automatic (implantable) cardiac defibrillator: Secondary | ICD-10-CM

## 2016-07-05 DIAGNOSIS — I5042 Chronic combined systolic (congestive) and diastolic (congestive) heart failure: Secondary | ICD-10-CM

## 2016-07-05 NOTE — Telephone Encounter (Signed)
Remote ICM transmission received.  Attempted patient call and left message for return call.   

## 2016-07-05 NOTE — Progress Notes (Signed)
EPIC Encounter for ICM Monitoring  Patient Name: Douglas [name] is a 40 y.o. male Date: 07/05/2016 Primary Care Physican: Nyra Market, MD Primary Cardiologist: Bensimhon Electrophysiologist: Graciela Husbands Dry Weight: unknown    Attempted ICM call and unable to reach.  Transmission reviewed.   Thoracic impedance abnormal suggesting fluid accumulation since 06/28/2016 to 07/05/2016.  LABS: 06/01/2016 Creatinine 1.34, BUN 6, Potassium 3.4, Sodium 139 01/09/2016 Creatinine 1.26, BUN 15, Potassium 3.3, Sodium 138 05/07/2015 Creatinine 1.30, BUN 18, Potassium 3.5, Sodium 139  Follow-up plan: ICM clinic phone appointment on 08/02/2016.  Copy of ICM check sent to device physician.   ICM trend: 07/05/2016       Karie Soda, RN 07/05/2016 11:07 AM

## 2016-07-06 MED FILL — CARVEDILOL 12.5 MG TABLET: 12.5 | 30 days supply | Qty: 60 | Fill #1

## 2016-07-06 MED FILL — BIDIL TABLET: 20-37.5 | 30 days supply | Qty: 90 | Fill #0

## 2016-07-06 MED FILL — SPIRONOLACTONE 25 MG TABLET: 25 | 30 days supply | Qty: 15 | Fill #0

## 2016-07-06 MED FILL — TORSEMIDE 20 MG TABLET: 20 | 30 days supply | Qty: 210 | Fill #0

## 2016-07-06 MED FILL — KLOR-CON M20 TABLET: 20 | 30 days supply | Qty: 30 | Fill #0

## 2016-07-06 MED FILL — ENTRESTO 24 MG-26 MG TABLET: 24-26 | 30 days supply | Qty: 60 | Fill #0

## 2016-07-16 ENCOUNTER — Inpatient Hospital Stay (HOSPITAL_COMMUNITY): Admission: RE | Admit: 2016-07-16 | Payer: Self-pay | Source: Ambulatory Visit | Admitting: Internal Medicine

## 2016-07-16 ENCOUNTER — Ambulatory Visit (HOSPITAL_COMMUNITY): Admission: RE | Admit: 2016-07-16 | Payer: Medicaid Other | Source: Ambulatory Visit

## 2016-07-16 ENCOUNTER — Telehealth (HOSPITAL_COMMUNITY): Payer: Self-pay | Admitting: Vascular Surgery

## 2016-07-16 NOTE — Telephone Encounter (Signed)
Called pt to resch appt/ he no showed

## 2016-08-02 ENCOUNTER — Ambulatory Visit (INDEPENDENT_AMBULATORY_CARE_PROVIDER_SITE_OTHER): Payer: Medicaid Other | Admitting: *Deleted

## 2016-08-02 ENCOUNTER — Telehealth: Payer: Self-pay | Admitting: Cardiology

## 2016-08-02 DIAGNOSIS — I428 Other cardiomyopathies: Secondary | ICD-10-CM

## 2016-08-02 DIAGNOSIS — Z0389 Encounter for observation for other suspected diseases and conditions ruled out: Secondary | ICD-10-CM | POA: Diagnosis not present

## 2016-08-02 DIAGNOSIS — I5042 Chronic combined systolic (congestive) and diastolic (congestive) heart failure: Secondary | ICD-10-CM | POA: Diagnosis not present

## 2016-08-02 DIAGNOSIS — Z4502 Encounter for adjustment and management of automatic implantable cardiac defibrillator: Secondary | ICD-10-CM

## 2016-08-02 DIAGNOSIS — I429 Cardiomyopathy, unspecified: Secondary | ICD-10-CM

## 2016-08-02 DIAGNOSIS — Z95810 Presence of automatic (implantable) cardiac defibrillator: Secondary | ICD-10-CM

## 2016-08-02 NOTE — Telephone Encounter (Signed)
LMOVM reminding pt to send remote transmission.   

## 2016-08-03 ENCOUNTER — Telehealth: Payer: Self-pay

## 2016-08-03 NOTE — Progress Notes (Signed)
EPIC Encounter for ICM Monitoring  Patient Name: Douglas [name] is a 40 y.o. male Date: 08/03/2016 Primary Care Physican: Nyra Market, MD Primary Cardiologist: Bensimhon Electrophysiologist: Graciela Husbands Dry Weight: unknown     Attempted ICM call and unable to reach.  Transmission reviewed.  Patient did not show for HF clinic appointment on 07/16/2016.  Thoracic impedance abnormal suggesting fluid accumulation.  LABS: 06/01/2016 Creatinine 1.34, BUN 6, Potassium 3.4, Sodium 139 01/09/2016 Creatinine 1.26, BUN 15, Potassium 3.3, Sodium 138 05/07/2015 Creatinine 1.30, BUN 18, Potassium 3.5, Sodium 139  Recommendations:  Unable to reach   Follow-up plan: ICM clinic phone appointment on 09/07/2016.  Copy of ICM check sent to primary cardiologist and device physician.   ICM trend: 08/03/2016       Karie Soda, RN 08/03/2016 10:22 AM

## 2016-08-03 NOTE — Telephone Encounter (Signed)
Remote ICM transmission received.  Attempted patient call and left message for return call.   

## 2016-08-03 NOTE — Progress Notes (Signed)
Remote ICD transmission.   

## 2016-08-04 ENCOUNTER — Encounter: Payer: Self-pay | Admitting: Internal Medicine

## 2016-08-04 ENCOUNTER — Encounter: Payer: Self-pay | Admitting: Cardiology

## 2016-08-20 ENCOUNTER — Encounter: Payer: Self-pay | Admitting: Cardiology

## 2016-08-20 ENCOUNTER — Encounter (HOSPITAL_COMMUNITY): Payer: Self-pay

## 2016-08-20 LAB — CUP PACEART REMOTE DEVICE CHECK
Battery Voltage: 3.01 V
Brady Statistic RV Percent Paced: 0.01 %
HighPow Impedance: 55 Ohm
HighPow Impedance: 74 Ohm
Implantable Lead Location: 753860
Implantable Lead Model: 7121
Lead Channel Impedance Value: 323 Ohm
Lead Channel Impedance Value: 456 Ohm
Lead Channel Pacing Threshold Amplitude: 0.625 V
Lead Channel Setting Pacing Amplitude: 2.5 V
Lead Channel Setting Pacing Pulse Width: 0.4 ms
Lead Channel Setting Sensing Sensitivity: 0.45 mV
MDC IDC LEAD IMPLANT DT: 20141009
MDC IDC MSMT BATTERY REMAINING LONGEVITY: 115 mo
MDC IDC MSMT LEADCHNL RV PACING THRESHOLD PULSEWIDTH: 0.4 ms
MDC IDC MSMT LEADCHNL RV SENSING INTR AMPL: 15.5 mV
MDC IDC MSMT LEADCHNL RV SENSING INTR AMPL: 15.5 mV
MDC IDC SESS DTM: 20170918200917

## 2016-08-20 NOTE — Progress Notes (Signed)
Patient dropped off Medical Exam Forms and medical record request to CHF clinic front desk yesterday, asking for same day completion.  Due to high volume of high acuity patients in clinic and other disability/forms completion claims submitted beforehand due this week, we were unable to complete same-day. Paperwork excuses patient from work duties for 60 days from today's date until he is seen in November with repeat echo and follow up. Patient is noted to be very noncompliant however, with frequent no shows. Claim has been completed today, signed by Dr. Shirlee Latch, and faxed to provided fax # [phone] Patient made aware.  Copy of forms scanned into patient's electronic medical record.  Ave Filter, RN

## 2016-08-25 MED FILL — KLOR-CON M20 TABLET: 20 | 30 days supply | Qty: 30 | Fill #1

## 2016-08-25 MED FILL — CARVEDILOL 12.5 MG TABLET: 12.5 | 30 days supply | Qty: 60 | Fill #2

## 2016-08-25 MED FILL — ENTRESTO 24 MG-26 MG TABLET: 24-26 | 30 days supply | Qty: 60 | Fill #1

## 2016-08-25 MED FILL — TORSEMIDE 20 MG TABLET: 20 | 30 days supply | Qty: 210 | Fill #1

## 2016-08-25 MED FILL — SPIRONOLACTONE 25 MG TABLET: 25 | 30 days supply | Qty: 15 | Fill #1

## 2016-09-07 ENCOUNTER — Ambulatory Visit (INDEPENDENT_AMBULATORY_CARE_PROVIDER_SITE_OTHER): Payer: Medicaid Other

## 2016-09-07 DIAGNOSIS — Z4502 Encounter for adjustment and management of automatic implantable cardiac defibrillator: Secondary | ICD-10-CM | POA: Diagnosis not present

## 2016-09-07 DIAGNOSIS — I5042 Chronic combined systolic (congestive) and diastolic (congestive) heart failure: Secondary | ICD-10-CM

## 2016-09-07 DIAGNOSIS — Z95810 Presence of automatic (implantable) cardiac defibrillator: Secondary | ICD-10-CM

## 2016-09-07 DIAGNOSIS — I428 Other cardiomyopathies: Secondary | ICD-10-CM

## 2016-09-09 NOTE — Progress Notes (Signed)
EPIC Encounter for ICM Monitoring  Patient Name: Douglas [name] is a 40 y.o. male Date: 09/09/2016 Primary Care Physican: Nyra Market, MD Primary Cardiologist:Bensimhon Electrophysiologist: Candie Echevaria Weight:  289 lbs                                 Heart Failure questions reviewed, pt asymptomatic   Thoracic impedance normal but had been abnormal from approximately 9/26 to 10/12.  LABS: 06/01/2016 Creatinine 1.34, BUN 6, Potassium 3.4, Sodium 139 01/09/2016 Creatinine 1.26, BUN 15, Potassium 3.3, Sodium 138 05/07/2015 Creatinine 1.30, BUN 18, Potassium 3.5, Sodium 139  Recommendations: No changes.  Advised to limit salt intake to 2000 mg daily.  Encouraged to call for fluid symptoms.    Follow-up plan: ICM clinic phone appointment on 11/01/2016 and office appointment with Dr [name] 09/23/2016..  Copy of ICM check sent to device physician.   ICM trend: 09/07/2016       Karie Soda, RN 09/09/2016 12:28 PM

## 2016-09-23 ENCOUNTER — Ambulatory Visit (HOSPITAL_BASED_OUTPATIENT_CLINIC_OR_DEPARTMENT_OTHER)
Admission: RE | Admit: 2016-09-23 | Discharge: 2016-09-23 | Disposition: A | Discharge status: Home / Self Care | Payer: Medicaid Other | Source: Ambulatory Visit | Attending: Internal Medicine | Admitting: Internal Medicine

## 2016-09-23 ENCOUNTER — Ambulatory Visit (HOSPITAL_COMMUNITY)
Admission: RE | Admit: 2016-09-23 | Discharge: 2016-09-23 | Disposition: A | Discharge status: Home / Self Care | Payer: Medicaid Other | Source: Ambulatory Visit | Attending: Internal Medicine | Admitting: Internal Medicine

## 2016-09-23 ENCOUNTER — Encounter (HOSPITAL_COMMUNITY): Payer: Self-pay | Admitting: Internal Medicine

## 2016-09-23 VITALS — BP 114/64 | HR 83 | Wt 309.8 lb

## 2016-09-23 DIAGNOSIS — Z6837 Body mass index (BMI) 37.0-37.9, adult: Secondary | ICD-10-CM | POA: Insufficient documentation

## 2016-09-23 DIAGNOSIS — I472 Ventricular tachycardia, unspecified: Secondary | ICD-10-CM

## 2016-09-23 DIAGNOSIS — Z880 Allergy status to penicillin: Secondary | ICD-10-CM | POA: Insufficient documentation

## 2016-09-23 DIAGNOSIS — Z95810 Presence of automatic (implantable) cardiac defibrillator: Secondary | ICD-10-CM | POA: Diagnosis not present

## 2016-09-23 DIAGNOSIS — I110 Hypertensive heart disease with heart failure: Secondary | ICD-10-CM | POA: Diagnosis not present

## 2016-09-23 DIAGNOSIS — E119 Type 2 diabetes mellitus without complications: Secondary | ICD-10-CM | POA: Insufficient documentation

## 2016-09-23 DIAGNOSIS — E669 Obesity, unspecified: Secondary | ICD-10-CM | POA: Diagnosis not present

## 2016-09-23 DIAGNOSIS — Z7982 Long term (current) use of aspirin: Secondary | ICD-10-CM | POA: Insufficient documentation

## 2016-09-23 DIAGNOSIS — F17210 Nicotine dependence, cigarettes, uncomplicated: Secondary | ICD-10-CM | POA: Insufficient documentation

## 2016-09-23 DIAGNOSIS — I5042 Chronic combined systolic (congestive) and diastolic (congestive) heart failure: Secondary | ICD-10-CM | POA: Diagnosis not present

## 2016-09-23 DIAGNOSIS — M069 Rheumatoid arthritis, unspecified: Secondary | ICD-10-CM | POA: Diagnosis not present

## 2016-09-23 DIAGNOSIS — I10 Essential (primary) hypertension: Secondary | ICD-10-CM | POA: Diagnosis not present

## 2016-09-23 DIAGNOSIS — Z79899 Other long term (current) drug therapy: Secondary | ICD-10-CM | POA: Insufficient documentation

## 2016-09-23 DIAGNOSIS — I5022 Chronic systolic (congestive) heart failure: Secondary | ICD-10-CM | POA: Diagnosis not present

## 2016-09-23 DIAGNOSIS — I428 Other cardiomyopathies: Secondary | ICD-10-CM | POA: Diagnosis not present

## 2016-09-23 LAB — ECHOCARDIOGRAM COMPLETE
E decel time: 211 msec
E/e' ratio: 12.69
FS: 9 % — AB (ref 28–44)
IVS/LV PW RATIO, ED: 0.89
LA ID, A-P, ES: 41 mm
LA vol index: 27.7 mL/m2
LA vol: 76.9 mL
LADIAMINDEX: 1.48 cm/m2
LAVOLA4C: 58.5 mL
LDCA: 5.73 cm2
LEFT ATRIUM END SYS DIAM: 41 mm
LV E/e' medial: 12.69
LV E/e'average: 12.69
LV TDI E'MEDIAL: 3.94
LVELAT: 4.13 cm/s
LVOT diameter: 27 mm
Lateral S' vel: 9.75 cm/s
MV Dec: 211
MV pk A vel: 82 m/s
MV pk E vel: 52.4 m/s
PW: 14.3 mm — AB (ref 0.6–1.1)
RV sys press: 96 mmHg
TDI e' lateral: 4.13
VTI: 172 cm

## 2016-09-23 LAB — BASIC METABOLIC PANEL
Anion gap: 10 (ref 5–15)
BUN: 16 mg/dL (ref 6–20)
CALCIUM: 8.7 mg/dL — AB (ref 8.9–10.3)
CO2: 27 mmol/L (ref 22–32)
CREATININE: 1.28 mg/dL — AB (ref 0.61–1.24)
Chloride: 99 mmol/L — ABNORMAL LOW (ref 101–111)
Glucose, Bld: 113 mg/dL — ABNORMAL HIGH (ref 65–99)
Potassium: 3.7 mmol/L (ref 3.5–5.1)
SODIUM: 136 mmol/L (ref 135–145)

## 2016-09-23 MED ORDER — SACUBITRIL-VALSARTAN 49-51 MG PO TABS
1.0000 | ORAL_TABLET | Freq: Two times a day (BID) | ORAL | 3 refills | Status: DC
Start: 2016-09-23 — End: 2017-04-12

## 2016-09-23 NOTE — Patient Instructions (Signed)
Increase Entresto to 49/51 mg Twice daily   Labs today  Lab in 10 days  Your physician recommends that you schedule a follow-up appointment in: 3 months

## 2016-09-23 NOTE — Progress Notes (Signed)
  Echocardiogram 2D Echocardiogram has been performed.  Douglas [name] 09/23/2016, 12:05 PM

## 2016-09-23 NOTE — Progress Notes (Signed)
Patient ID: Douglas [name], male   DOB: [birth year], 40 y.o.   MRN: [id number]  EP: Dr Graciela Husbands PCP: Dr. Johna Roles Primary Cardiologist: Dr. [name]  History of Present Illness: Douglas [name] is a 40 y.o. male with a h/o HTN, HL, obesity, NICM and chronic systolic heart failure. He is S/P Medtronic ICD due to NICM. Management has been complicated by severe non-compliance.   August 2012 CPX VO2 20  Underwent cath in 2006 which showed normal cors with EF 20-25%.   EF had recovered in late 2013 but has again fallen to 15% per echo 01/20/13.  He was admitted to [facility] 02/21/13 from clinic for low output symptoms and volume overload.  He required IV lasix and metolazone.  He diuresed 19 pounds with discharge weight of 254 pounds.  He had runs of VT therefore he was started on amiodarone per EP and Lifevest was placed at discharge.  He underwent RHC on 02/22/13.  He has not required metolazone.   He was admitted in 1/16 with VT and ICD shock x 2.   Today he returns for HF follow up. Overall feels ok. Denies SOB/PND/Orthopnea. Weight at home 309 pounds. Smoking 4 cigarettes a day. Has not used cocaine in the last few months. Taking all medications.   Optivol: Volume status low. Activity 3 hours/day. No VT. ReDS 31  RHC 02/22/13  RA = 12  RV = 49/6/15  PA = 54/29 (41)  PCW = 28 (v= 40)  Fick cardiac output/index = 4.3/1.7  PVR = 2.6 Woods  SVR = 1580 dynes  FA sat = 98%  PA sat = 53%, 54%  Non-invasive BP = 124/86 (97)  RHC 05/03/13 RA = 2  RV = 29/2/2  PA = 26/10 (17)  PCW = 3  Fick cardiac output/index = 6.1/2.3  PVR = 2.3  FA sat = 93%  PA sat = 65%, 67%  ECHO 06/25/13 EF 20-25% ECHO 05/01/14 EF 35-40%, moderately dilated LV, mild LVH, diffuse hypokinesis Echo 6/16 EF 25-30%, mild LVH, moderately dilated LV.  ECHO 09/23/2016 EF 25-30%.  Labs (10/15/13): K 3.9 Creatinine 0.99 Labs (3/15): K 4, creatinine 1.04, TSH normal, LFTs, normal Labs (12/10/2014): K 4.4 Creatinine 1.42   SH:  lives with his Mom. Rare ETOH, smokes rarely. Remote cocaine.  FH: Mom has CAD.    ROS:  Please see the history of present illness.  He denies fevers, chills, melena, hematochezia.  All other systems reviewed and negative.  Past Medical History:  Diagnosis Date  . Arthritis    ra  . Automatic implantable cardioverter-defibrillator in situ   . Diabetes mellitus (HCC)   . ED (erectile dysfunction)   . Exertional shortness of breath   . Hypertension   . Nonischemic cardiomyopathy (HCC)    a.  echo 4/06: EF 30%, mild to mod MR, mild RAE, inf HK, lat HK , ant HK;    b.  cath 4/06: no CAD, EF 20-25%  . NSVT (nonsustained ventricular tachycardia) (HCC)    eval by EP in past; no ICD candidate due to NYHA 1 symptoms  . Obesity   . Systolic CHF, chronic (HCC)     Current Outpatient Prescriptions  Medication Sig Dispense Refill  . amiodarone (PACERONE) 200 MG tablet Take 1 tablet (200 mg total) by mouth daily. 30 tablet 5  . aspirin EC 81 MG EC tablet Take 1 tablet (81 mg total) by mouth daily. 30 tablet 0  . carvedilol (COREG) 12.5 MG tablet Take  1 tablet (12.5 mg total) by mouth 2 (two) times daily with a meal. 60 tablet 3  . isosorbide-hydrALAZINE (BIDIL) 20-37.5 MG tablet Take 1 tablet by mouth 3 (three) times daily. 90 tablet 3  . metolazone (ZAROXOLYN) 2.5 MG tablet Take 1 tablet (2.5 mg total) by mouth as needed. 30 tablet 0  . potassium chloride SA (K-DUR,KLOR-CON) 20 MEQ tablet Take 1 tablet (20 mEq total) by mouth daily. 30 tablet 3  . sacubitril-valsartan (ENTRESTO) 24-26 MG Take 1 tablet by mouth 2 (two) times daily. 60 tablet 3  . spironolactone (ALDACTONE) 25 MG tablet Take 0.5 tablets (12.5 mg total) by mouth daily. 45 tablet 3  . torsemide (DEMADEX) 20 MG tablet Take 4 tabs in AM and 3 tabs in PM 210 tablet 3   No current facility-administered medications for this encounter.     Allergies  Allergen Reactions  . Penicillins Other (See Comments)    Unknown childhood  reaction     Vitals:   09/23/16 1150  BP: 114/64  Pulse: 83  SpO2: 99%  Weight: (!) 309 lb 12.8 oz (140.5 kg)    PHYSICAL EXAM: General: Well nourished, well developed, in no acute distress  HEENT: normal  Neck: supple. N thyromegaly. JVP flat   Carotids 2+ bilaterally.  No Bruits   Cardiac:  PMI laterally displaced. Regular rate and rhythm, no s3 Lungs:  clear to auscultation bilaterally, no wheezing, rhonchi or rales Ab: obese. Soft NT.  Non-distended.  No bruits. Ext warm. no cyanosis or clubbing. No edema.  Skin: warm and dry Psych:normal affect. CNII-XII grossly intact  1) Chronic systolic HF: Nonischemic cardiomyopathy, last echo in 6/16 with EF 25-30%.  S/P Medtronic ICD 08/2013.  NYHA class III symptoms - Continue torsemide  80 mg  Qam/60 mg  qpm.  Continue KCl 20 daily.  - Continue Coreg 12.5 bid.  - Take Bidil tid rather than bid.  - Increase Entresto 49-51 mg twice a day.  - Continue spironolactone 12.5 mg daily. Check BMET today   - Reinforced daily weights, low salt food choices, and limiting fluid intake to < 2 liters per day  2) HTN: BP controlled.   3) VT:  Continue amiodarone 200 mg daily.  TSH LFTs on in July 2017.   4) Cocaine abuse:  None recently.   5) Tobacco Abuse- Counseled to stop.    Follow up in 3 month.  Amy Clegg NP-C  09/23/2016  Patient seen and examined with Tonye Becket, NP. We discussed all aspects of the encounter. I agree with the assessment and plan as stated above.   Overall doing better. Compliant with meds. NYHA II. Volume status looks good. I reviewed echo images personally EF 25-30%. ICD and ReDS readings also done in clinic. Volume ok. No VT. Exercise level improving.  Agree with med changes as above. Will see back 2-3 months.   Rennae Ferraiolo,MD 11:27 PM

## 2016-10-04 ENCOUNTER — Ambulatory Visit (HOSPITAL_COMMUNITY)
Admission: RE | Admit: 2016-10-04 | Discharge: 2016-10-04 | Disposition: A | Discharge status: Home / Self Care | Payer: Medicaid Other | Source: Ambulatory Visit | Attending: Cardiology | Admitting: Cardiology

## 2016-10-04 DIAGNOSIS — I5042 Chronic combined systolic (congestive) and diastolic (congestive) heart failure: Secondary | ICD-10-CM | POA: Insufficient documentation

## 2016-10-04 DIAGNOSIS — I5023 Acute on chronic systolic (congestive) heart failure: Secondary | ICD-10-CM

## 2016-10-04 LAB — BASIC METABOLIC PANEL
ANION GAP: 9 (ref 5–15)
BUN: 11 mg/dL (ref 6–20)
CALCIUM: 9.3 mg/dL (ref 8.9–10.3)
CO2: 26 mmol/L (ref 22–32)
Chloride: 103 mmol/L (ref 101–111)
Creatinine, Ser: 1.42 mg/dL — ABNORMAL HIGH (ref 0.61–1.24)
GFR calc Af Amer: >60 mL/min (ref >60)
GLUCOSE: 170 mg/dL — AB (ref 65–99)
POTASSIUM: 3.3 mmol/L — AB (ref 3.5–5.1)
Sodium: 138 mmol/L (ref 135–145)

## 2016-10-05 ENCOUNTER — Telehealth (HOSPITAL_COMMUNITY): Payer: Self-pay | Admitting: *Deleted

## 2016-10-05 MED ORDER — POTASSIUM CHLORIDE CRYS ER 20 MEQ PO TBCR: 40.0000 meq | EXTENDED_RELEASE_TABLET | Freq: Every day | ORAL | 3 refills | Status: DC

## 2016-10-05 MED FILL — KLOR-CON M20 TABLET: 20 | 30 days supply | Qty: 60 | Fill #0

## 2016-10-05 MED FILL — AMIODARONE HCL 200 MG TAB: 200 | 30 days supply | Qty: 30 | Fill #2

## 2016-10-05 MED FILL — TORSEMIDE 20 MG TABLET: 20 | 30 days supply | Qty: 210 | Fill #2

## 2016-10-05 NOTE — Telephone Encounter (Signed)
Notes Recorded by Modesta Messing, CMA on 10/05/2016 at 10:12 AM EST Patient aware. Medication updated in patients chart.   ------  Notes Recorded by Modesta Messing, CMA on 10/04/2016 at 4:05 PM EST Left message for patient to call back.  ------  Notes Recorded by Dolores Patty, MD on 10/04/2016 at 3:09 PM EST Increase kcl to 40 daily    Ref Range & Units 1d ago 12d ago 28mo ago   Sodium 135 - 145 mmol/L 138  136  139    Potassium 3.5 - 5.1 mmol/L 3.3   3.7  3.4     Chloride 101 - 111 mmol/L 103  99   100     CO2 22 - 32 mmol/L 26  27  29     Glucose, Bld 65 - 99 mg/dL   372   902     BUN 6 - 20 mg/dL 11  16  6     Creatinine, Ser 0.61 - 1.24 mg/dL 111     5.52     Calcium 8.9 - 10.3 mg/dL 9.3  8.7   8.8     GFR calc non Af Amer >60 mL/min >60  >60  >60    GFR calc Af Amer >60 mL/min >60  >60CM  >60CM

## 2016-10-29 MED FILL — AMIODARONE HCL 200 MG TAB: 200 | 30 days supply | Qty: 30 | Fill #3

## 2016-10-29 MED FILL — KLOR-CON M20 TABLET: 20 | 30 days supply | Qty: 60 | Fill #1

## 2016-10-29 MED FILL — SPIRONOLACTONE 25 MG TABLET: 25 | 30 days supply | Qty: 15 | Fill #2

## 2016-10-29 MED FILL — TORSEMIDE 20 MG TABLET: 20 | 30 days supply | Qty: 210 | Fill #3

## 2016-10-29 MED FILL — CARVEDILOL 12.5 MG TABLET: 12.5 | 30 days supply | Qty: 60 | Fill #3

## 2016-10-29 MED FILL — ENTRESTO 49 MG-51 MG TABLET: 49-51 | 30 days supply | Qty: 60 | Fill #0

## 2016-11-01 ENCOUNTER — Ambulatory Visit (INDEPENDENT_AMBULATORY_CARE_PROVIDER_SITE_OTHER): Payer: Medicaid Other | Admitting: *Deleted

## 2016-11-01 ENCOUNTER — Telehealth: Payer: Self-pay | Admitting: Cardiology

## 2016-11-01 ENCOUNTER — Telehealth: Payer: Self-pay

## 2016-11-01 DIAGNOSIS — I428 Other cardiomyopathies: Secondary | ICD-10-CM

## 2016-11-01 DIAGNOSIS — Z4502 Encounter for adjustment and management of automatic implantable cardiac defibrillator: Secondary | ICD-10-CM

## 2016-11-01 DIAGNOSIS — I5042 Chronic combined systolic (congestive) and diastolic (congestive) heart failure: Secondary | ICD-10-CM

## 2016-11-01 NOTE — Telephone Encounter (Signed)
LMOVM reminding pt to send remote transmission.   

## 2016-11-01 NOTE — Progress Notes (Signed)
EPIC Encounter for ICM Monitoring  Patient Name: KESTER STIMPSON is a 40 y.o. male Date: 11/01/2016 Primary Care Physican: Alphonzo Grieve, MD Primary Cardiologist:Bensimhon Electrophysiologist: Faustino Congress Weight:unknown        Attempted ICM call and unable to reach.  Left message to return call.  Transmission reviewed.   Thoracic impedance abnormal suggesting fluid accumulation.  Labs: 10/04/2016 Creatinine 1.42, BUN 11, Potassium 3.3, Sodium 138, EGFR >60 09/23/2016 Creatinine 1.28, BUN 16, Potassium 3.7, Sodium 136, EGFR >60        06/01/2016 Creatinine 1.34, BUN 6,   Potassium 3.4, Sodium 139, EGFR >60 01/09/2016 Creatinine 1.26, BUN 15, Potassium 3.3, Sodium 138, EGFR 70-81 05/07/2015 Creatinine 1.30, BUN 18, Potassium 3.5, Sodium 139  Recommendations:  NONE- unable to reach.  Left ICM direct number and requested return call.   Follow-up plan: ICM clinic phone appointment on 12/03/2016 to recheck fluid levels.  Copy of ICM check sent to primary cardiologist and device physician.   ICM trend: 11/01/2016       Rosalene Billings, RN 11/01/2016 4:49 PM

## 2016-11-01 NOTE — Telephone Encounter (Signed)
Remote ICM transmission received.  Attempted patient call and left message to return call.   

## 2016-11-02 LAB — CUP PACEART REMOTE DEVICE CHECK
Battery Voltage: 3.01 V
HighPow Impedance: 52 Ohm
HighPow Impedance: 61 Ohm
Implantable Lead Location: 753860
Implantable Pulse Generator Implant Date: 20141009
Lead Channel Impedance Value: 456 Ohm
Lead Channel Pacing Threshold Amplitude: 0.75 V
Lead Channel Sensing Intrinsic Amplitude: 17.5 mV
Lead Channel Setting Sensing Sensitivity: 0.45 mV
MDC IDC LEAD IMPLANT DT: 20141009
MDC IDC LEAD MODEL: 7121
MDC IDC MSMT BATTERY REMAINING LONGEVITY: 111 mo
MDC IDC MSMT LEADCHNL RV IMPEDANCE VALUE: 323 Ohm
MDC IDC MSMT LEADCHNL RV PACING THRESHOLD PULSEWIDTH: 0.4 ms
MDC IDC MSMT LEADCHNL RV SENSING INTR AMPL: 17.5 mV
MDC IDC SESS DTM: 20171218203945
MDC IDC SET LEADCHNL RV PACING AMPLITUDE: 2.5 V
MDC IDC SET LEADCHNL RV PACING PULSEWIDTH: 0.4 ms
MDC IDC STAT BRADY RV PERCENT PACED: 0.01 %

## 2016-11-02 NOTE — Progress Notes (Signed)
Remote ICD transmission.   

## 2016-11-03 ENCOUNTER — Encounter: Payer: Self-pay | Admitting: Cardiology

## 2016-11-19 ENCOUNTER — Encounter: Payer: Self-pay | Admitting: Cardiology

## 2016-12-03 ENCOUNTER — Ambulatory Visit (INDEPENDENT_AMBULATORY_CARE_PROVIDER_SITE_OTHER): Payer: Medicaid Other

## 2016-12-03 DIAGNOSIS — I5042 Chronic combined systolic (congestive) and diastolic (congestive) heart failure: Secondary | ICD-10-CM

## 2016-12-03 DIAGNOSIS — Z4502 Encounter for adjustment and management of automatic implantable cardiac defibrillator: Secondary | ICD-10-CM

## 2016-12-03 DIAGNOSIS — Z95810 Presence of automatic (implantable) cardiac defibrillator: Secondary | ICD-10-CM | POA: Diagnosis not present

## 2016-12-06 ENCOUNTER — Telehealth: Payer: Self-pay

## 2016-12-06 NOTE — Progress Notes (Signed)
EPIC Encounter for ICM Monitoring  Patient Name: Douglas [name] is a 41 y.o. male Date: 12/06/2016 Primary Care Physican: Alphonzo Grieve, MD Primary Cardiologist:Bensimhon Electrophysiologist: Faustino Congress Weight:unknown                                                              Attempted ICM call and unable to reach.  Left message to return call.  Transmission reviewed.   Thoracic impedance abnormal suggesting fluid accumulation.  Labs: 10/04/2016 Creatinine 1.42, BUN 11, Potassium 3.3, Sodium 138, EGFR >60 09/23/2016 Creatinine 1.28, BUN 16, Potassium 3.7, Sodium 136, EGFR >60        06/01/2016 Creatinine 1.34, BUN 6,   Potassium 3.4, Sodium 139, EGFR >60 01/09/2016 Creatinine 1.26, BUN 15, Potassium 3.3, Sodium 138, EGFR 70-81 05/07/2015 Creatinine 1.30, BUN 18, Potassium 3.5, Sodium 139  Recommendations:  NONE - unable to reach  Follow-up plan: ICM clinic phone appointment on 01/31/2017.  HF clinic appointment on 12/27/2016  Copy of ICM check sent to device physician.   3 month ICM trend: 12/03/2016   1 Year ICM trend:      Rosalene Billings, RN 12/06/2016 11:14 AM

## 2016-12-06 NOTE — Telephone Encounter (Signed)
Remote ICM transmission received.  Attempted patient call and left message to return call.   

## 2016-12-17 ENCOUNTER — Other Ambulatory Visit (HOSPITAL_COMMUNITY): Payer: Self-pay | Admitting: Cardiology

## 2016-12-17 ENCOUNTER — Other Ambulatory Visit: Payer: Self-pay | Admitting: Internal Medicine

## 2016-12-17 MED FILL — BIDIL TABLET: 20-37.5 | 30 days supply | Qty: 90 | Fill #1

## 2016-12-17 MED FILL — AMIODARONE HCL 200 MG TAB: 200 | 30 days supply | Qty: 30 | Fill #4

## 2016-12-17 MED FILL — SPIRONOLACTONE 25 MG TABLET: 25 | 30 days supply | Qty: 15 | Fill #3

## 2016-12-17 MED FILL — POTASSIUM CL ER 20 MEQ TAB: 20 | 30 days supply | Qty: 60 | Fill #2

## 2016-12-20 MED FILL — TORSEMIDE 20 MG TABLET: 20 | 30 days supply | Qty: 210 | Fill #0

## 2016-12-27 ENCOUNTER — Encounter (HOSPITAL_COMMUNITY): Payer: Self-pay

## 2016-12-27 ENCOUNTER — Encounter: Payer: Self-pay | Admitting: Internal Medicine

## 2016-12-27 MED FILL — CARVEDILOL 12.5 MG TABLET: 12.5 | 30 days supply | Qty: 60 | Fill #0

## 2016-12-29 MED FILL — ENTRESTO 49 MG-51 MG TABLET: 49-51 | 30 days supply | Qty: 60 | Fill #1

## 2017-01-04 ENCOUNTER — Telehealth (HOSPITAL_COMMUNITY): Payer: Self-pay | Admitting: Pharmacist

## 2017-01-04 NOTE — Telephone Encounter (Signed)
Entresto PA approved by [place] Medicaid through 12/24/17.   [name]. Bonnye Fava, PharmD, BCPS, CPP Clinical Pharmacist Pager: [phone] Phone: [phone] 01/04/2017 11:01 AM

## 2017-01-05 ENCOUNTER — Ambulatory Visit (HOSPITAL_COMMUNITY)
Admission: RE | Admit: 2017-01-05 | Discharge: 2017-01-05 | Disposition: A | Discharge status: Home / Self Care | Payer: Medicaid Other | Source: Ambulatory Visit | Attending: Cardiology | Admitting: Cardiology

## 2017-01-05 VITALS — BP 116/84 | HR 97 | Ht 76.0 in | Wt 327.2 lb

## 2017-01-05 DIAGNOSIS — F1410 Cocaine abuse, uncomplicated: Secondary | ICD-10-CM | POA: Insufficient documentation

## 2017-01-05 DIAGNOSIS — Z7982 Long term (current) use of aspirin: Secondary | ICD-10-CM | POA: Insufficient documentation

## 2017-01-05 DIAGNOSIS — F17210 Nicotine dependence, cigarettes, uncomplicated: Secondary | ICD-10-CM | POA: Insufficient documentation

## 2017-01-05 DIAGNOSIS — E669 Obesity, unspecified: Secondary | ICD-10-CM | POA: Insufficient documentation

## 2017-01-05 DIAGNOSIS — I10 Essential (primary) hypertension: Secondary | ICD-10-CM

## 2017-01-05 DIAGNOSIS — Z720 Tobacco use: Secondary | ICD-10-CM | POA: Diagnosis not present

## 2017-01-05 DIAGNOSIS — Z95810 Presence of automatic (implantable) cardiac defibrillator: Secondary | ICD-10-CM | POA: Insufficient documentation

## 2017-01-05 DIAGNOSIS — Z79899 Other long term (current) drug therapy: Secondary | ICD-10-CM | POA: Insufficient documentation

## 2017-01-05 DIAGNOSIS — Z6839 Body mass index (BMI) 39.0-39.9, adult: Secondary | ICD-10-CM | POA: Insufficient documentation

## 2017-01-05 DIAGNOSIS — I110 Hypertensive heart disease with heart failure: Secondary | ICD-10-CM | POA: Insufficient documentation

## 2017-01-05 DIAGNOSIS — E119 Type 2 diabetes mellitus without complications: Secondary | ICD-10-CM | POA: Insufficient documentation

## 2017-01-05 DIAGNOSIS — M069 Rheumatoid arthritis, unspecified: Secondary | ICD-10-CM | POA: Insufficient documentation

## 2017-01-05 DIAGNOSIS — I428 Other cardiomyopathies: Secondary | ICD-10-CM | POA: Insufficient documentation

## 2017-01-05 DIAGNOSIS — I5042 Chronic combined systolic (congestive) and diastolic (congestive) heart failure: Secondary | ICD-10-CM | POA: Diagnosis not present

## 2017-01-05 DIAGNOSIS — F1490 Cocaine use, unspecified, uncomplicated: Secondary | ICD-10-CM

## 2017-01-05 DIAGNOSIS — Z880 Allergy status to penicillin: Secondary | ICD-10-CM | POA: Insufficient documentation

## 2017-01-05 DIAGNOSIS — I5022 Chronic systolic (congestive) heart failure: Secondary | ICD-10-CM | POA: Insufficient documentation

## 2017-01-05 DIAGNOSIS — Z8249 Family history of ischemic heart disease and other diseases of the circulatory system: Secondary | ICD-10-CM | POA: Insufficient documentation

## 2017-01-05 LAB — BASIC METABOLIC PANEL
Anion gap: 9 (ref 5–15)
BUN: 18 mg/dL (ref 6–20)
CALCIUM: 9.1 mg/dL (ref 8.9–10.3)
CO2: 24 mmol/L (ref 22–32)
CREATININE: 1.46 mg/dL — AB (ref 0.61–1.24)
Chloride: 102 mmol/L (ref 101–111)
GFR calc Af Amer: >60 mL/min (ref >60)
GFR, EST NON AFRICAN AMERICAN: 58 mL/min — AB (ref >60)
Glucose, Bld: 213 mg/dL — ABNORMAL HIGH (ref 65–99)
POTASSIUM: 4.1 mmol/L (ref 3.5–5.1)
SODIUM: 135 mmol/L (ref 135–145)

## 2017-01-05 LAB — BRAIN NATRIURETIC PEPTIDE: B NATRIURETIC PEPTIDE 5: 115.8 pg/mL — AB (ref 0.0–100.0)

## 2017-01-05 NOTE — Progress Notes (Addendum)
Advanced Heart Failure Medication Review by a Pharmacist  Does the patient  feel that his/her medications are working for him/her?  yes  Has the patient been experiencing any side effects to the medications prescribed?  Yes - headache with Bidil  Does the patient measure his/her own blood pressure or blood glucose at home?  no   Does the patient have any problems obtaining medications due to transportation or finances?   No - [place] Medicaid    Understanding of regimen: good Understanding of indications: good Potential of compliance: good Patient understands to avoid NSAIDs. Patient understands to avoid decongestants.  Issues to address at subsequent visits: None   Pharmacist comments: Douglas [name] is a pleasant 41 yo M presenting without a medication list but with great recall of his regimen including dosages. He reports good compliance with his regimen but states that he has not had Entresto in about a week 2/2 the insurance not covering it. I did get the PA through [facility] and it should have been covered since 2/14 but he has not checked with the pharmacy since then. He should have no problems with it going forward. He also stated that he has been having headaches with Bidil for which I recommended using APAP about 30 mins prior to each dose to see if this helps.   [name]. Bonnye Fava, PharmD, BCPS, CPP Clinical Pharmacist Pager: [phone] Phone: [phone] 01/05/2017 11:20 AM      Time with patient: 10 minutes Preparation and documentation time: 2 minutes Total time: 12 minutes

## 2017-01-05 NOTE — Progress Notes (Signed)
Patient ID: Douglas [name], male   DOB: Jun 26, 1976, 41 y.o.   MRN: [id number]  EP: Dr Graciela Husbands PCP: Dr. Johna Roles Primary Cardiologist: Dr. [name]  History of Present Illness: Douglas [name] is a 41 y.o. male with a h/o HTN, HL, obesity, NICM and chronic systolic heart failure. He is S/P Medtronic ICD due to NICM. Management has been complicated by severe non-compliance.   August 2012 CPX VO2 20  Underwent cath in 2006 which showed normal cors with EF 20-25%.   EF had recovered in late 2013 but has again fallen to 15% per echo 01/20/13.  He was admitted to [facility] 02/21/13 from clinic for low output symptoms and volume overload.  He required IV lasix and metolazone.  He diuresed 19 pounds with discharge weight of 254 pounds.  He had runs of VT therefore he was started on amiodarone per EP and Lifevest was placed at discharge.  He underwent RHC on 02/22/13.  He has not required metolazone.   He was admitted in 1/16 with VT and ICD shock x 2.   Pt returns today for HF follow up.  At last visit Entresto increased. Pt up 18 lbs from that visit.  Feeling good overall.  Hasn't been following a low sodium diet, so feels like weight gain is adipose.  Denies more SOB.  Smoking 4-5 cigarettes a day. Still waiting to hear back about disability (second hearing pending). Gets SOB walking up stairs. No problems in grocery store.  Not weighing daily. (Says its at least 3 weeks) and he was around 320 at that time.  Not very active. Has been depressed and stressed. Feeling better and hoping to get back into watching what he eats and exercises. No cocaine since March 2017. Has been taking all medications except Entresto, which he has been out of for about a week.   Optivol: Volume status low.  Thoracic impedence above threshold. Pt active for nearly 3 hrs daily. No VT/VF episodes noted.  ReDS 38 -> Unfortunately BMI above limit now, so ? Accuracy with weight gain.   RHC 02/22/13  RA = 12  RV = 49/6/15  PA = 54/29  (41)  PCW = 28 (v= 40)  Fick cardiac output/index = 4.3/1.7  PVR = 2.6 Woods  SVR = 1580 dynes  FA sat = 98%  PA sat = 53%, 54%  Non-invasive BP = 124/86 (97)  RHC 05/03/13 RA = 2  RV = 29/2/2  PA = 26/10 (17)  PCW = 3  Fick cardiac output/index = 6.1/2.3  PVR = 2.3  FA sat = 93%  PA sat = 65%, 67%  ECHO 06/25/13 EF 20-25% ECHO 05/01/14 EF 35-40%, moderately dilated LV, mild LVH, diffuse hypokinesis Echo 6/16 EF 25-30%, mild LVH, moderately dilated LV.  ECHO 09/23/2016 EF 25-30%.  Labs (10/15/13): K 3.9 Creatinine 0.99 Labs (3/15): K 4, creatinine 1.04, TSH normal, LFTs, normal Labs (12/10/2014): K 4.4 Creatinine 1.42   SH: lives with his Mom. Rare ETOH, smokes rarely. Remote cocaine.  FH: Mom has CAD.    ROS:  Please see the history of present illness.  He denies fevers, chills, melena, hematochezia.  All other systems reviewed and negative.  Past Medical History:  Diagnosis Date  . Arthritis    ra  . Automatic implantable cardioverter-defibrillator in situ   . Diabetes mellitus (HCC)   . ED (erectile dysfunction)   . Exertional shortness of breath   . Hypertension   . Nonischemic cardiomyopathy (HCC)  a.  echo 4/06: EF 30%, mild to mod MR, mild RAE, inf HK, lat HK , ant HK;    b.  cath 4/06: no CAD, EF 20-25%  . NSVT (nonsustained ventricular tachycardia) (HCC)    eval by EP in past; no ICD candidate due to NYHA 1 symptoms  . Obesity   . Systolic CHF, chronic (HCC)     Current Outpatient Prescriptions  Medication Sig Dispense Refill  . amiodarone (PACERONE) 200 MG tablet Take 1 tablet (200 mg total) by mouth daily. 30 tablet 5  . aspirin EC 81 MG EC tablet Take 1 tablet (81 mg total) by mouth daily. 30 tablet 0  . carvedilol (COREG) 12.5 MG tablet TAKE 1 TABLET BY MOUTH 2 TIMES DAILY WITH A MEAL. 60 tablet 0  . isosorbide-hydrALAZINE (BIDIL) 20-37.5 MG tablet Take 1 tablet by mouth 3 (three) times daily. 90 tablet 3  . potassium chloride SA (K-DUR,KLOR-CON) 20  MEQ tablet Take 2 tablets (40 mEq total) by mouth daily. 60 tablet 3  . spironolactone (ALDACTONE) 25 MG tablet Take 0.5 tablets (12.5 mg total) by mouth daily. 45 tablet 3  . torsemide (DEMADEX) 20 MG tablet TAKE 4 TABLETS BY MOUTH IN THE MORNING AND 3 TABLETS IN EVENING 210 tablet 3  . metolazone (ZAROXOLYN) 2.5 MG tablet Take 1 tablet (2.5 mg total) by mouth as needed. (Patient not taking: Reported on 01/05/2017) 30 tablet 0  . sacubitril-valsartan (ENTRESTO) 49-51 MG Take 1 tablet by mouth 2 (two) times daily. (Patient not taking: Reported on 01/05/2017) 60 tablet 3   No current facility-administered medications for this encounter.     Allergies  Allergen Reactions  . Penicillins Other (See Comments)    Unknown childhood reaction     Vitals:   01/05/17 1113  BP: 116/84  BP Location: Left Arm  Patient Position: Sitting  Cuff Size: Large  Pulse: 97  SpO2: 98%  Weight: (!) 327 lb 3.2 oz (148.4 kg)   Wt Readings from Last 3 Encounters:  01/05/17 (!) 327 lb 3.2 oz (148.4 kg)  09/23/16 (!) 309 lb 12.8 oz (140.5 kg)  06/01/16 (!) 306 lb 4 oz (138.9 kg)   PHYSICAL EXAM: General: Obese, NAD.   HEENT: Normal Neck: supple. No thyromegaly. JVP difficult to assess. Appears 7-8. Carotids 2+ bilaterally.  No Bruits   Cardiac:  PMI laterally displaced. RRR. No S3 appreciated.  Lungs:  CTAB, normal effort.  Ab: Obese, soft, NT, ND, no HSM. No bruits or masses. +BS  Ext warm. no cyanosis or clubbing. Trace ankle edema at most.  Skin: warm and dry Psych:normal affect. CNII-XII grossly intact  1) Chronic systolic HF: Nonischemic cardiomyopathy, last echo in 6/16 with EF 25-30%.  S/P Medtronic ICD 08/2013.  NYHA class III symptoms - Echo 09/23/16 with LVEF 25-30%, grade 1 DD, Trivial MR, Mild LAE, Mild RV reduction.  - Continue torsemide  80 mg qam/60 mg  Qpm. BMET today.  Take extra 20 mg as needed in evenings. - Continue KCl 20 daily. BMET today.  - Continue Coreg 12.5 bid.  - Take Bidil  1 tab TID. Can try tyelonol for HAs. May need to cut back if no relief.  - Resume Entresto 49-51 mg twice a day. BMET today.  - Continue spironolactone 12.5 mg daily. - Reinforced fluid restriction to < 2 L daily, sodium restriction to less than 2000 mg daily, and the importance of daily weights.   2) HTN:  - BP stable despite being off Entresto.  3) VT:  - Continue amio 200 mg daily. No VT/VF on optivol.   - Check TSH/LFTs next visit.    4) Cocaine abuse:   - Will be sober 1 year next month.  5) Tobacco Abuse - Encouraged complete cessation.   Labs today. Follow up 4 weeks with resumption of Entresto.  Think volume status is OK despite weight gain with optivol and exam.  ReDs vest result reading 38%, but skewed by poor fit and BMI.  Graciella Freer, PA-C  01/05/2017   Total time spent > 25 minutes. Over half that spent discussing the above.

## 2017-01-05 NOTE — Patient Instructions (Signed)
Routine lab work today. Will notify you of abnormal results, otherwise no news is good news!  RESTART Entresto 49/51 mg tablet twice daily.  Follow up 4 weeks with Otilio Saber PA-C.  Do the following things EVERYDAY: 1) Weigh yourself in the morning before breakfast. Write it down and keep it in a log. 2) Take your medicines as prescribed 3) Eat low salt foods-Limit salt (sodium) to 2000 mg per day.  4) Stay as active as you can everyday 5) Limit all fluids for the day to less than 2 liters

## 2017-01-07 ENCOUNTER — Telehealth: Payer: Self-pay | Admitting: Internal Medicine

## 2017-01-07 NOTE — Telephone Encounter (Signed)
APT. REMINDER CALL, LMTCB °

## 2017-01-10 ENCOUNTER — Encounter: Payer: Self-pay | Admitting: Internal Medicine

## 2017-01-10 ENCOUNTER — Ambulatory Visit: Payer: Self-pay

## 2017-01-13 ENCOUNTER — Telehealth: Payer: Self-pay | Admitting: Internal Medicine

## 2017-01-13 NOTE — Telephone Encounter (Signed)
APT. REMINDER CALL, LMTCB °

## 2017-01-14 ENCOUNTER — Ambulatory Visit (INDEPENDENT_AMBULATORY_CARE_PROVIDER_SITE_OTHER): Payer: Medicaid Other | Admitting: Internal Medicine

## 2017-01-14 ENCOUNTER — Encounter: Payer: Self-pay | Admitting: Internal Medicine

## 2017-01-14 VITALS — BP 124/64 | HR 75 | Temp 97.4°F | Ht 76.0 in | Wt 329.6 lb

## 2017-01-14 DIAGNOSIS — E785 Hyperlipidemia, unspecified: Secondary | ICD-10-CM | POA: Diagnosis not present

## 2017-01-14 DIAGNOSIS — I129 Hypertensive chronic kidney disease with stage 1 through stage 4 chronic kidney disease, or unspecified chronic kidney disease: Secondary | ICD-10-CM | POA: Diagnosis not present

## 2017-01-14 DIAGNOSIS — E1165 Type 2 diabetes mellitus with hyperglycemia: Secondary | ICD-10-CM | POA: Diagnosis present

## 2017-01-14 DIAGNOSIS — I5042 Chronic combined systolic (congestive) and diastolic (congestive) heart failure: Secondary | ICD-10-CM

## 2017-01-14 DIAGNOSIS — B351 Tinea unguium: Secondary | ICD-10-CM

## 2017-01-14 DIAGNOSIS — F321 Major depressive disorder, single episode, moderate: Secondary | ICD-10-CM

## 2017-01-14 DIAGNOSIS — Z79899 Other long term (current) drug therapy: Secondary | ICD-10-CM | POA: Diagnosis not present

## 2017-01-14 DIAGNOSIS — F17210 Nicotine dependence, cigarettes, uncomplicated: Secondary | ICD-10-CM

## 2017-01-14 DIAGNOSIS — Z9114 Patient's other noncompliance with medication regimen: Secondary | ICD-10-CM

## 2017-01-14 DIAGNOSIS — E782 Mixed hyperlipidemia: Secondary | ICD-10-CM

## 2017-01-14 DIAGNOSIS — N182 Chronic kidney disease, stage 2 (mild): Secondary | ICD-10-CM | POA: Diagnosis not present

## 2017-01-14 DIAGNOSIS — F1410 Cocaine abuse, uncomplicated: Secondary | ICD-10-CM | POA: Diagnosis not present

## 2017-01-14 DIAGNOSIS — F1490 Cocaine use, unspecified, uncomplicated: Secondary | ICD-10-CM

## 2017-01-14 DIAGNOSIS — I428 Other cardiomyopathies: Secondary | ICD-10-CM | POA: Diagnosis not present

## 2017-01-14 DIAGNOSIS — I10 Essential (primary) hypertension: Secondary | ICD-10-CM

## 2017-01-14 DIAGNOSIS — F329 Major depressive disorder, single episode, unspecified: Secondary | ICD-10-CM | POA: Insufficient documentation

## 2017-01-14 DIAGNOSIS — Z0000 Encounter for general adult medical examination without abnormal findings: Secondary | ICD-10-CM

## 2017-01-14 DIAGNOSIS — Z720 Tobacco use: Secondary | ICD-10-CM

## 2017-01-14 DIAGNOSIS — E1122 Type 2 diabetes mellitus with diabetic chronic kidney disease: Secondary | ICD-10-CM

## 2017-01-14 HISTORY — DX: Major depressive disorder, single episode, unspecified: F32.9

## 2017-01-14 LAB — POCT GLYCOSYLATED HEMOGLOBIN (HGB A1C): HEMOGLOBIN A1C: 8.3

## 2017-01-14 LAB — GLUCOSE, CAPILLARY: Glucose-Capillary: 192 mg/dL — ABNORMAL HIGH (ref 65–99)

## 2017-01-14 MED ORDER — METFORMIN HCL ER 500 MG PO TB24: 500.0000 mg | ORAL_TABLET | Freq: Every day | ORAL | 1 refills | Status: DC

## 2017-01-14 MED ORDER — ATORVASTATIN CALCIUM 40 MG PO TABS: 40.0000 mg | ORAL_TABLET | Freq: Every day | ORAL | 11 refills | Status: DC

## 2017-01-14 MED ORDER — BUPROPION HCL ER (XL) 150 MG PO TB24: 150.0000 mg | ORAL_TABLET | ORAL | 2 refills | Status: DC

## 2017-01-14 NOTE — Progress Notes (Signed)
CC: "regular follow up"  HPI:  [name] is a 41 y.o. with a PMH of NICM s/p ICD, h/o VT, HTN, DM, cocaine and tobacco abuse; patient presents to clinic for a follow up on his health maintenance and diabetes.   DM: Patient with last A1c of 8.3 in 12/2015; he has been prescribed a metformin uptitration; patient has not been to clinic since and has stopped medicine he thinks due to side effect. He has noticed some intermittent blurry vision, increased thirst and polyuria (though on diuretics). He denies nausea, vomiting, dizziness, diaphoresis.   NICM: Patient followed by Dr. Gala [name].   HLD: Patient was prescribed atorvastatin but has discontinued this; he does not know the reason but thinks it was around the same time as the metformin so he thinks he was having some side effects but does not know which ones.  Tobacco abuse: Patient currently smokes about 5 cigarettes a day. He has worked on decreasing his smoking and is interested in quitting.   Cocaine abuse: Patient with history of cocaine abuse; he had been sober for almost a year but has recently restarted using. He states he has used cocaine about 3 times in the past month with last time being about 2 days ago. He states that he knows he should not be using cocaine and that it puts him at increased risk for more heart problems, strokes, among others. He states he has been feeling depressed lately so has tried the cocaine to increase his energy and not sleep all the time.   Depression, major: Patient endorses a few week history of decreased energy, increased sleep, either increased appetite or decreased appetite where he has to force himself to eat for the day.  Please see problem based Assessment and Plan for status of patients chronic conditions.  Past Medical History:  Diagnosis Date  . Arthritis    ra  . Automatic implantable cardioverter-defibrillator in situ   . Diabetes mellitus (HCC)   . ED (erectile dysfunction)    . Exertional shortness of breath   . Hypertension   . Nonischemic cardiomyopathy (HCC)    a.  echo 4/06: EF 30%, mild to mod MR, mild RAE, inf HK, lat HK , ant HK;    b.  cath 4/06: no CAD, EF 20-25%  . NSVT (nonsustained ventricular tachycardia) (HCC)    eval by EP in past; no ICD candidate due to NYHA 1 symptoms  . Obesity   . Systolic CHF, chronic (HCC)     Review of Systems:   Review of Systems  Constitutional: Positive for malaise/fatigue. Negative for chills, diaphoresis, fever and weight loss.  HENT: Negative for hearing loss.   Eyes: Negative for blurred vision and double vision.  Respiratory: Positive for shortness of breath (with exertion, stable). Negative for cough, hemoptysis, sputum production and wheezing.   Cardiovascular: Negative for chest pain, palpitations and leg swelling.  Gastrointestinal: Negative for abdominal pain, blood in stool, constipation, diarrhea, heartburn, melena, nausea and vomiting.  Genitourinary: Negative for dysuria, frequency, hematuria and urgency.  Skin: Negative for rash.  Neurological: Negative for dizziness, sensory change, weakness and headaches.  Endo/Heme/Allergies: Positive for polydipsia.  Psychiatric/Behavioral: Positive for depression and substance abuse (cocaine, tobacco). Negative for suicidal ideas. The patient does not have insomnia (hypersomnolence).     Physical Exam:  Vitals:   01/14/17 1321  BP: 124/64  Pulse: 75  Temp: 97.4 F (36.3 C)  TempSrc: Oral  SpO2: 98%  Weight: (!) 329 lb  9.6 oz (149.5 kg)  Height: 6\' 4"  (1.93 m)   Physical Exam  Constitutional: He is oriented to person, place, and time. He appears well-developed and well-nourished. No distress.  HENT:  Head: Normocephalic and atraumatic.  Eyes: Conjunctivae and EOM are normal.  Neck: Normal range of motion. Neck supple.  Cardiovascular: Normal rate, regular rhythm, normal heart sounds and intact distal pulses.  Exam reveals no gallop and no friction  rub.   Pulmonary/Chest: Effort normal and breath sounds normal. No respiratory distress. He has no wheezes. He has no rales.  Abdominal: Soft. Bowel sounds are normal. He exhibits no distension. There is no tenderness. There is no guarding.  Musculoskeletal: Normal range of motion. He exhibits edema (minimal pitting ankle edema). He exhibits no tenderness.  Neurological: He is alert and oriented to person, place, and time. No cranial nerve deficit or sensory deficit.  Skin: Skin is warm and dry. Capillary refill takes less than 2 seconds. He is not diaphoretic. No erythema.    Assessment & Plan:   See Encounters Tab for problem based charting.   Patient discussed with Dr. , MD Internal Medicine PGY1

## 2017-01-14 NOTE — Patient Instructions (Addendum)
For your depression and smoking --I have sent in for a medicine called Wellbutrin (bupropion), please take one pill once a day --set a date after you have started taking the Wellbutrin (like Monday March 5th) and tell your family/friends this is your smoking quit date --I have sent in a referral for counseling; [facility] is a free, Publishing rights manager for counseling; I have given you their information.  For your diabetes --we will start Glucophage; take one pill once a day with a meal  --we will see you in about 3 months and see

## 2017-01-15 LAB — MICROALBUMIN / CREATININE URINE RATIO
Creatinine, Urine: 69.4 mg/dL
MICROALB/CREAT RATIO: 264.6 mg/g{creat} — AB (ref 0.0–30.0)
MICROALBUM., U, RANDOM: 183.6 ug/mL

## 2017-01-15 LAB — LIPID PANEL
Chol/HDL Ratio: 6.2 ratio units — ABNORMAL HIGH (ref 0.0–5.0)
Cholesterol, Total: 279 mg/dL — ABNORMAL HIGH (ref 100–199)
HDL: 45 mg/dL (ref >39)
LDL Calculated: 176 mg/dL — ABNORMAL HIGH (ref 0–99)
Triglycerides: 288 mg/dL — ABNORMAL HIGH (ref 0–149)
VLDL Cholesterol Cal: 58 mg/dL — ABNORMAL HIGH (ref 5–40)

## 2017-01-16 DIAGNOSIS — B351 Tinea unguium: Secondary | ICD-10-CM | POA: Insufficient documentation

## 2017-01-16 NOTE — Assessment & Plan Note (Addendum)
Patient compliant with coreg 12.5mg  BID, bidil 20-37.5mg  TID, spironolactone 12.5mg  daily, torsemide 80mg  qAM 60mg  qPM, entresto BID. He has metolazone 2.5mg  PRN which he has not used in a couple of years. Last ECHO 09/2016 shows EF 25-30%, G1DD, diffuse hypokinesis, LV dilation with mild hypertrophy. He states that he drinks about 100oz of fluids per day and knows this needs to be decreased to about 70oz. He has began using cocaine again and states understanding of the risks of its use on his heart disease, stroke, etc.  He He states he has stable exertional dyspnea; no cough, orthopnea, PND; stable LE edema.   Exam shows minimal pitting LE edema, lungs without rales; appears euvolemic at this time.   Plan: --continue current regimen --re-enforced fluid restriction, salt restriction and weight loss --re-enforced need for abstinence from cocaine; patient declined substance abuse program at this time --patient has f/u with cards later this month

## 2017-01-16 NOTE — Assessment & Plan Note (Signed)
BP stable today; patient compliant with coreg 12.5mg  BID, bidil 20-37.5mg  TID, spironolactone 12.5mg  daily, torsemide 80mg  qAM 60mg  qPM, entresto BID. Potassium on 01/05/17 was 4.1; Cr 1.4 stable, seems to be uptrending slowly.   Plan: --continue current regimen

## 2017-01-16 NOTE — Assessment & Plan Note (Signed)
Patient with DM, NICM, HTN, and HLD, non complaint with lipid-lowering therapy.   He is in agreement to restart atorvastatin today.  Plan: --check lipid panel > LDL 176 --restart atorvastatin 40mg  daily

## 2017-01-16 NOTE — Assessment & Plan Note (Addendum)
Patient with last A1c of 8.3 on 12/2015, non compliant with metformin. He thinks he had a side effect but is not certain exactly why he stopped the metformin. He endorses increased thirst. He denies numbness or sensory change in distal extremities.   He is in agreement to resume medical therapy today.  Plan: --check A1c > 8.3 today --metformin ER '500mg'$  daily --Cr and eGFR stable on last labs 01/05/2017 --microalbumin/cr ratio slightly increased from previous at 230; pt on Entresto --foot exam performed today --lipids checked: LDL elevated; restarted atorvastatin '40mg'$  daily --ordered retinal scan through clinic

## 2017-01-16 NOTE — Assessment & Plan Note (Signed)
Patient received annual flu shot today.

## 2017-01-16 NOTE — Assessment & Plan Note (Signed)
Patient with history of cocaine abuse; he had been sober for almost a year but has recently restarted using. He states he has used cocaine about 3 times in the past month with last time being about 2 days ago. He states that he knows he should not be using cocaine and that it puts him at increased risk for more heart problems, strokes, among others. He states he has been feeling depressed lately so has tried the cocaine to increase his energy and not sleep all the time.  Plan: --encouraged cessation --begin treatment for depression with Wellbutrin --refer to psychology, given info on [facility] in meantime as there can be a delay in appointment time with [facility]

## 2017-01-16 NOTE — Assessment & Plan Note (Signed)
Patient continues to smoke about 5 cigarettes per day. He is interested in cessation. With concurrent treatment of his MDD, Wellbutrin was started. Patient educated on quit date setting.  Plan: --Wellbutrin

## 2017-01-16 NOTE — Assessment & Plan Note (Signed)
Patient endorses a few week history of decreased energy, increased sleep, either increased appetite or decreased appetite where he has to force himself to eat for the day. He has started using cocaine again to help with his hypersomnolence. He states that one driving factor for his depression is the amount of medicines he has to be on. He also has frustration with the Disability Insurance process - he finally has a court date in May.  PSQ9 was 12/27 - moderate depression; patient denies SI/HI.  Patient is in agreement for starting medical therapy in conjunction with counseling. We discussed that even though right now it will be an extra medicine to take, this is one that does not have to be long term. We also discussed cocaine abuse and how this will further drive his depression. He is looking to increase his activity, which will certainly help with his depression as well as his other co-morbidities.   Plan: --wellbutrin SR 150mg  daily; can increase to 300mg  if needed --[facility] referral; provided him with [facility] contact info in the meantime as there can be a delay in [facility] appointments --encouraged cocaine cessation  --f/u in 6 weeks for response

## 2017-01-16 NOTE — Assessment & Plan Note (Signed)
Patient with bilateral onychomycosis. He is interested in referral to podiatry. Diabetic foot exam otherwise unremarkable.  Plan: --refer to podiatry

## 2017-01-17 NOTE — Progress Notes (Signed)
Medicine attending: Medical history, presenting problems, physical findings, and medications, reviewed with resident physician Dr Gorica Svalina on the day of the patient visit and I concur with her evaluation and management plan. 

## 2017-01-19 ENCOUNTER — Other Ambulatory Visit: Payer: Self-pay | Admitting: Internal Medicine

## 2017-01-19 MED FILL — AMIODARONE HCL 200 MG TAB: 200 | 30 days supply | Qty: 30 | Fill #5

## 2017-01-19 MED FILL — METFORMIN HCL ER 500 MG TAB: 500 | 30 days supply | Qty: 30 | Fill #0

## 2017-01-19 MED FILL — ATORVASTATIN 40 MG TABLET: 40 | 30 days supply | Qty: 30 | Fill #0

## 2017-01-19 MED FILL — TORSEMIDE 20 MG TABLET: 20 | 30 days supply | Qty: 210 | Fill #1

## 2017-01-19 MED FILL — BUPROPION HCL XL 150 MG TAB: 150 | 30 days supply | Qty: 30 | Fill #0

## 2017-01-20 MED FILL — CARVEDILOL 12.5 MG TABLET: 12.5 | 30 days supply | Qty: 60 | Fill #0

## 2017-01-21 ENCOUNTER — Telehealth: Payer: Self-pay

## 2017-01-21 MED FILL — ENTRESTO 49 MG-51 MG TABLET: 49-51 | 30 days supply | Qty: 60 | Fill #2

## 2017-01-31 ENCOUNTER — Telehealth: Payer: Self-pay

## 2017-01-31 ENCOUNTER — Ambulatory Visit (INDEPENDENT_AMBULATORY_CARE_PROVIDER_SITE_OTHER): Payer: Medicaid Other | Admitting: *Deleted

## 2017-01-31 DIAGNOSIS — I428 Other cardiomyopathies: Secondary | ICD-10-CM

## 2017-01-31 DIAGNOSIS — I5042 Chronic combined systolic (congestive) and diastolic (congestive) heart failure: Secondary | ICD-10-CM

## 2017-01-31 DIAGNOSIS — Z4502 Encounter for adjustment and management of automatic implantable cardiac defibrillator: Secondary | ICD-10-CM | POA: Diagnosis not present

## 2017-01-31 NOTE — Progress Notes (Signed)
EPIC Encounter for ICM Monitoring  Patient Name: Douglas [name] is a 41 y.o. male Date: 01/31/2017 Primary Care Physican: Alphonzo Grieve, MD Primary Cardiologist:Bensimhon Electrophysiologist: Faustino Congress Weight:unknown      Attempted call to patient and unable to reach.  Left message to return call. Transmission reviewed.    Thoracic impedance normal.  Prescribed dosage: Torsemide 20 mg 4 tablets (80 mg total) every morning and 3 tablets (60 mg total) every pm.  Potassium 20 mEq 2 tablets daily.  Metolazone 2.5 mg prn  Labs: 01/05/2017 Creatinine 1.46, BUN 18, Potassium 4.1, Sodium 135, EGFR 58->60 10/04/2016 Creatinine 1.42, BUN 11, Potassium 3.3, Sodium 138, EGFR >60 09/23/2016 Creatinine 1.28, BUN 16, Potassium 3.7, Sodium 136, EGFR >60  06/01/2016 Creatinine 1.34, BUN 6, Potassium 3.4, Sodium 139, EGFR >60 01/09/2016 Creatinine 1.26, BUN 15, Potassium 3.3, Sodium 138, EGFR 70-81 05/07/2015 Creatinine 1.30, BUN 18, Potassium 3.5, Sodium 139  Recommendations: NONE - Unable to reach patient   Follow-up plan: ICM clinic phone appointment on 03/08/2017. HF clinic office appointment on 02/02/2017  Copy of ICM check sent to device physician.   3 month ICM trend: 01/31/2017   1 Year ICM trend:      Rosalene Billings, RN 01/31/2017 1:26 PM

## 2017-01-31 NOTE — Telephone Encounter (Signed)
Remote ICM transmission received.  Attempted patient call and left message to return call.   

## 2017-01-31 NOTE — Progress Notes (Signed)
Remote ICD transmission.   

## 2017-02-02 ENCOUNTER — Encounter (HOSPITAL_COMMUNITY): Payer: Self-pay

## 2017-02-02 ENCOUNTER — Ambulatory Visit: Payer: Medicaid Other | Admitting: Podiatry

## 2017-02-02 ENCOUNTER — Encounter: Payer: Self-pay | Admitting: Cardiology

## 2017-02-02 LAB — CUP PACEART REMOTE DEVICE CHECK
Battery Remaining Longevity: 107 mo
Battery Voltage: 3.01 V
Brady Statistic RV Percent Paced: 0.01 %
Date Time Interrogation Session: 20180319083827
HIGH POWER IMPEDANCE MEASURED VALUE: 53 Ohm
HIGH POWER IMPEDANCE MEASURED VALUE: 70 Ohm
Implantable Pulse Generator Implant Date: 20141009
Lead Channel Impedance Value: 323 Ohm
Lead Channel Impedance Value: 456 Ohm
Lead Channel Sensing Intrinsic Amplitude: 12.625 mV
Lead Channel Sensing Intrinsic Amplitude: 12.625 mV
Lead Channel Setting Pacing Amplitude: 2.5 V
MDC IDC LEAD IMPLANT DT: 20141009
MDC IDC LEAD LOCATION: 753860
MDC IDC MSMT LEADCHNL RV PACING THRESHOLD AMPLITUDE: 0.75 V
MDC IDC MSMT LEADCHNL RV PACING THRESHOLD PULSEWIDTH: 0.4 ms
MDC IDC SET LEADCHNL RV PACING PULSEWIDTH: 0.4 ms
MDC IDC SET LEADCHNL RV SENSING SENSITIVITY: 0.45 mV

## 2017-02-02 NOTE — Addendum Note (Signed)
Addended by: Neomia Dear on: 02/02/2017 07:14 PM   Modules accepted: Orders

## 2017-02-16 ENCOUNTER — Encounter: Payer: Self-pay | Admitting: Cardiology

## 2017-02-16 ENCOUNTER — Ambulatory Visit (INDEPENDENT_AMBULATORY_CARE_PROVIDER_SITE_OTHER): Payer: Medicaid Other | Admitting: Podiatry

## 2017-02-16 VITALS — Resp 16 | Ht 76.0 in | Wt 320.0 lb

## 2017-02-16 DIAGNOSIS — L608 Other nail disorders: Secondary | ICD-10-CM

## 2017-02-16 DIAGNOSIS — B351 Tinea unguium: Secondary | ICD-10-CM

## 2017-02-16 DIAGNOSIS — M79609 Pain in unspecified limb: Secondary | ICD-10-CM

## 2017-02-16 DIAGNOSIS — L603 Nail dystrophy: Secondary | ICD-10-CM

## 2017-02-16 DIAGNOSIS — E0842 Diabetes mellitus due to underlying condition with diabetic polyneuropathy: Secondary | ICD-10-CM

## 2017-02-16 DIAGNOSIS — Z79899 Other long term (current) drug therapy: Secondary | ICD-10-CM

## 2017-02-16 DIAGNOSIS — M79676 Pain in unspecified toe(s): Secondary | ICD-10-CM

## 2017-02-16 DIAGNOSIS — E0843 Diabetes mellitus due to underlying condition with diabetic autonomic (poly)neuropathy: Secondary | ICD-10-CM

## 2017-02-16 LAB — HEPATIC FUNCTION PANEL
ALT: 19 U/L (ref 9–46)
AST: 10 U/L (ref 10–40)
Albumin: 3.9 g/dL (ref 3.6–5.1)
Alkaline Phosphatase: 77 U/L (ref 40–115)
BILIRUBIN DIRECT: 0.1 mg/dL (ref <=0.2)
BILIRUBIN INDIRECT: 0.5 mg/dL (ref 0.2–1.2)
TOTAL PROTEIN: 6.9 g/dL (ref 6.1–8.1)
Total Bilirubin: 0.6 mg/dL (ref 0.2–1.2)

## 2017-02-18 LAB — FUNGAL STAIN

## 2017-02-18 NOTE — Progress Notes (Signed)
   SUBJECTIVE Patient with a history of diabetes mellitus presents to office today complaining of elongated, thickened nails. Pain while ambulating in shoes. Patient is unable to trim their own nails.   OBJECTIVE General Patient is awake, alert, and oriented x 3 and in no acute distress. Derm Skin is dry and supple bilateral. Negative open lesions or macerations. Remaining integument unremarkable. Nails are tender, long, thickened and dystrophic with subungual debris, consistent with onychomycosis, 1-5 bilateral. No signs of infection noted. Vasc  DP and PT pedal pulses palpable bilaterally. Temperature gradient within normal limits.  Neuro Epicritic and protective threshold sensation diminished bilaterally.  Musculoskeletal Exam No symptomatic pedal deformities noted bilateral. Muscular strength within normal limits.  ASSESSMENT 1. Diabetes Mellitus w/ peripheral neuropathy 2. Onychomycosis of nail due to dermatophyte bilateral 3. Pain in foot bilateral 4. Fungal nails  PLAN OF CARE 1. Patient evaluated today. 2. Instructed to maintain good pedal hygiene and foot care. Stressed importance of controlling blood sugar.  3. Mechanical debridement of nails 1-5 bilaterally performed using a nail nipper. Filed with dremel without incident.  4. Orders for liver function tests were ordered today.  5. Today nail biopsy was taken and sent to pathology for fungal culture. 6. Patient is to return to clinic in 4 weeks to discuss fungal culture nail biopsy findings and LFT results and discuss different treatment options.    Felecia Shelling, DPM [facility]  Dr. Felecia Shelling, DPM    [address]                                        [place], [place] [address]                Office [phone]  Fax [phone]

## 2017-03-07 MED FILL — METFORMIN HCL ER 500 MG TAB: 500 | 30 days supply | Qty: 30 | Fill #1

## 2017-03-07 MED FILL — TORSEMIDE 20 MG TABLET: 20 | 30 days supply | Qty: 210 | Fill #2

## 2017-03-07 MED FILL — POTASSIUM CL ER 20 MEQ TAB: 20 | 30 days supply | Qty: 60 | Fill #3

## 2017-03-07 MED FILL — ENTRESTO 49 MG-51 MG TABLET: 49-51 | 30 days supply | Qty: 60 | Fill #3

## 2017-03-07 MED FILL — CARVEDILOL 12.5 MG TABLET: 12.5 | 30 days supply | Qty: 60 | Fill #1

## 2017-03-08 ENCOUNTER — Ambulatory Visit (INDEPENDENT_AMBULATORY_CARE_PROVIDER_SITE_OTHER): Payer: Medicaid Other

## 2017-03-08 DIAGNOSIS — I5042 Chronic combined systolic (congestive) and diastolic (congestive) heart failure: Secondary | ICD-10-CM

## 2017-03-08 DIAGNOSIS — Z95810 Presence of automatic (implantable) cardiac defibrillator: Secondary | ICD-10-CM | POA: Diagnosis not present

## 2017-03-08 DIAGNOSIS — Z4502 Encounter for adjustment and management of automatic implantable cardiac defibrillator: Secondary | ICD-10-CM

## 2017-03-08 NOTE — Progress Notes (Signed)
EPIC Encounter for ICM Monitoring  Patient Name: Douglas [name] is a 41 y.o. male Date: 03/08/2017 Primary Care Physican: Alphonzo Grieve, MD Primary Cardiologist:Bensimhon Electrophysiologist: Faustino Congress Weight:does not weigh         Heart Failure questions reviewed, pt has some left leg swelling but this is not new.     Thoracic impedance abnormal suggesting fluid accumulation.  Patient stated he did not have enough Furosemide tablets to last the month due to he was taking was higher dosage last month.  He refilled the medication yesterday and should will have the correct amount of tablets for the month.    Prescribed and confirmed dosage: Torsemide 20 mg 4 tablets (80 mg total) every AM and 3 tablets (60 mg total) every PM.  Potassium 20 mEq 2 tablets daily.    Labs: 01/05/2017 Creatinine 1.46, BUN 18, Potassium 4.1, Sodium 135, EGFR 58->60 10/04/2016 Creatinine 1.42, BUN 11, Potassium 3.3, Sodium 138, EGFR >60 09/23/2016 Creatinine 1.28, BUN 16, Potassium 3.7, Sodium 136, EGFR >60  06/01/2016 Creatinine 1.34, BUN 6, Potassium 3.4, Sodium 139, EGFR >60 01/09/2016 Creatinine 1.26, BUN 15, Potassium 3.3, Sodium 138, EGFR 70-81 05/07/2015 Creatinine 1.30, BUN 18, Potassium 3.5, Sodium 139  Recommendations:  Take medication as prescribed and limit salt intake   Follow-up plan: ICM clinic phone appointment on 03/17/2017.    Copy of ICM check sent to primary cardiologist and device physician for review.   3 month ICM trend: 03/08/2017   1 Year ICM trend:      Rosalene Billings, RN 03/08/2017 3:03 PM

## 2017-03-09 ENCOUNTER — Other Ambulatory Visit: Payer: Self-pay | Admitting: *Deleted

## 2017-03-09 DIAGNOSIS — I10 Essential (primary) hypertension: Secondary | ICD-10-CM

## 2017-03-09 MED ORDER — SPIRONOLACTONE 25 MG PO TABS: 12.5000 mg | ORAL_TABLET | Freq: Every day | ORAL | 3 refills | Status: DC

## 2017-03-16 ENCOUNTER — Ambulatory Visit: Payer: Medicaid Other | Admitting: Podiatry

## 2017-03-17 ENCOUNTER — Telehealth: Payer: Self-pay | Admitting: Cardiology

## 2017-03-17 NOTE — Telephone Encounter (Signed)
Douglas [name], doris please ignore my copying to you, I have called pt to tell him to schedule w/ cardiology

## 2017-03-17 NOTE — Telephone Encounter (Signed)
Spoke with pt and reminded pt of remote transmission that is due today. Pt verbalized understanding.   

## 2017-03-17 NOTE — Telephone Encounter (Signed)
Called pt, encouraged him to call and make an appt w/ cardiologist and please keep appt

## 2017-03-18 NOTE — Progress Notes (Signed)
No ICM remote transmission received for 03/17/2017 and next ICM transmission scheduled for 03/29/2017.

## 2017-03-29 ENCOUNTER — Ambulatory Visit (INDEPENDENT_AMBULATORY_CARE_PROVIDER_SITE_OTHER): Payer: Medicaid Other

## 2017-03-29 ENCOUNTER — Telehealth: Payer: Self-pay | Admitting: Cardiology

## 2017-03-29 DIAGNOSIS — I5042 Chronic combined systolic (congestive) and diastolic (congestive) heart failure: Secondary | ICD-10-CM | POA: Diagnosis not present

## 2017-03-29 DIAGNOSIS — Z4502 Encounter for adjustment and management of automatic implantable cardiac defibrillator: Secondary | ICD-10-CM | POA: Diagnosis not present

## 2017-03-29 NOTE — Telephone Encounter (Signed)
LMOVM reminding pt to send remote transmission.   

## 2017-03-31 ENCOUNTER — Telehealth: Payer: Self-pay

## 2017-03-31 NOTE — Progress Notes (Signed)
EPIC Encounter for ICM Monitoring  Patient Name: Douglas [name] is a 41 y.o. male Date: 03/31/2017 Primary Care Physican: Alphonzo Grieve, MD Primary Cardiologist:Bensimhon Electrophysiologist: Faustino Congress Weight:does not weigh       Attempted call to patient and unable to reach.  Left detailed message regarding transmission.  Transmission reviewed.    Thoracic impedance normal but was abnormal suggesting fluid accumulation 02/03/2017 to 03/18/2017.  Prescribed and confirmed dosage: Torsemide 20 mg 4 tablets (80 mg total) every AM and 3 tablets (60 mg total) every PM. Potassium 20 mEq 2 tablets daily.   Labs: 01/05/2017 Creatinine 1.46, BUN 18, Potassium 4.1, Sodium 135, EGFR 58->60 10/04/2016 Creatinine 1.42, BUN 11, Potassium 3.3, Sodium 138, EGFR >60 09/23/2016 Creatinine 1.28, BUN 16, Potassium 3.7, Sodium 136, EGFR >60  06/01/2016 Creatinine 1.34, BUN 6, Potassium 3.4, Sodium 139, EGFR >60 01/09/2016 Creatinine 1.26, BUN 15, Potassium 3.3, Sodium 138, EGFR 70-81 05/07/2015 Creatinine 1.30, BUN 18, Potassium 3.5, Sodium 139  Recommendations: Left voice mail with ICM number and encouraged to call for fluid symptoms.  Follow-up plan: ICM clinic phone appointment on 05/02/2017.    Copy of ICM check sent to device physician.   3 month ICM trend: 03/30/2017   1 Year ICM trend:      Rosalene Billings, RN 03/31/2017 11:30 AM

## 2017-03-31 NOTE — Telephone Encounter (Signed)
Remote ICM transmission received.  Attempted patient call and left detailed message regarding transmission and next ICM scheduled for 05/02/2017.  Advised to return call for any fluid symptoms or questions.    

## 2017-04-06 ENCOUNTER — Telehealth: Payer: Self-pay

## 2017-04-12 ENCOUNTER — Other Ambulatory Visit (HOSPITAL_COMMUNITY): Payer: Self-pay | Admitting: Internal Medicine

## 2017-04-12 MED FILL — METFORMIN HCL ER 500 MG TAB: 500 | 30 days supply | Qty: 30 | Fill #2

## 2017-04-12 MED FILL — TORSEMIDE 20 MG TABLET: 20 | 30 days supply | Qty: 210 | Fill #3

## 2017-04-12 MED FILL — CARVEDILOL 12.5 MG TABLET: 12.5 | 30 days supply | Qty: 60 | Fill #2

## 2017-04-12 MED FILL — BIDIL TABLET: 20-37.5 | 30 days supply | Qty: 90 | Fill #2

## 2017-04-13 MED FILL — ENTRESTO 49 MG-51 MG TABLET: 49-51 | 30 days supply | Qty: 60 | Fill #0

## 2017-04-20 ENCOUNTER — Other Ambulatory Visit: Payer: Self-pay | Admitting: *Deleted

## 2017-04-20 DIAGNOSIS — R55 Syncope and collapse: Secondary | ICD-10-CM

## 2017-04-20 MED ORDER — AMIODARONE HCL 200 MG PO TABS: 200.0000 mg | ORAL_TABLET | Freq: Every day | ORAL | 5 refills | Status: DC

## 2017-04-29 NOTE — Telephone Encounter (Signed)
error 

## 2017-05-02 ENCOUNTER — Ambulatory Visit (HOSPITAL_COMMUNITY)
Admission: RE | Admit: 2017-05-02 | Discharge: 2017-05-02 | Disposition: A | Discharge status: Home / Self Care | Payer: Medicaid Other | Source: Ambulatory Visit | Attending: Cardiology | Admitting: Cardiology

## 2017-05-02 ENCOUNTER — Ambulatory Visit (INDEPENDENT_AMBULATORY_CARE_PROVIDER_SITE_OTHER): Payer: Medicaid Other | Admitting: *Deleted

## 2017-05-02 ENCOUNTER — Encounter (HOSPITAL_COMMUNITY): Payer: Self-pay

## 2017-05-02 VITALS — BP 114/76 | HR 82 | Wt 341.8 lb

## 2017-05-02 DIAGNOSIS — Z720 Tobacco use: Secondary | ICD-10-CM | POA: Diagnosis not present

## 2017-05-02 DIAGNOSIS — Z7982 Long term (current) use of aspirin: Secondary | ICD-10-CM | POA: Diagnosis not present

## 2017-05-02 DIAGNOSIS — Z9111 Patient's noncompliance with dietary regimen: Secondary | ICD-10-CM | POA: Insufficient documentation

## 2017-05-02 DIAGNOSIS — I428 Other cardiomyopathies: Secondary | ICD-10-CM

## 2017-05-02 DIAGNOSIS — E119 Type 2 diabetes mellitus without complications: Secondary | ICD-10-CM | POA: Diagnosis not present

## 2017-05-02 DIAGNOSIS — Z880 Allergy status to penicillin: Secondary | ICD-10-CM | POA: Diagnosis not present

## 2017-05-02 DIAGNOSIS — I5022 Chronic systolic (congestive) heart failure: Secondary | ICD-10-CM | POA: Insufficient documentation

## 2017-05-02 DIAGNOSIS — Z4502 Encounter for adjustment and management of automatic implantable cardiac defibrillator: Secondary | ICD-10-CM

## 2017-05-02 DIAGNOSIS — Z79899 Other long term (current) drug therapy: Secondary | ICD-10-CM | POA: Insufficient documentation

## 2017-05-02 DIAGNOSIS — Z95810 Presence of automatic (implantable) cardiac defibrillator: Secondary | ICD-10-CM | POA: Diagnosis not present

## 2017-05-02 DIAGNOSIS — I472 Ventricular tachycardia, unspecified: Secondary | ICD-10-CM

## 2017-05-02 DIAGNOSIS — I5042 Chronic combined systolic (congestive) and diastolic (congestive) heart failure: Secondary | ICD-10-CM | POA: Diagnosis not present

## 2017-05-02 DIAGNOSIS — E669 Obesity, unspecified: Secondary | ICD-10-CM | POA: Diagnosis not present

## 2017-05-02 DIAGNOSIS — I10 Essential (primary) hypertension: Secondary | ICD-10-CM | POA: Diagnosis not present

## 2017-05-02 DIAGNOSIS — I110 Hypertensive heart disease with heart failure: Secondary | ICD-10-CM | POA: Insufficient documentation

## 2017-05-02 DIAGNOSIS — Z7984 Long term (current) use of oral hypoglycemic drugs: Secondary | ICD-10-CM | POA: Insufficient documentation

## 2017-05-02 LAB — TSH: TSH: 1.574 u[IU]/mL (ref 0.350–4.500)

## 2017-05-02 LAB — COMPREHENSIVE METABOLIC PANEL
ALBUMIN: 3.5 g/dL (ref 3.5–5.0)
ALT: 29 U/L (ref 17–63)
AST: 29 U/L (ref 15–41)
Alkaline Phosphatase: 78 U/L (ref 38–126)
Anion gap: 10 (ref 5–15)
BILIRUBIN TOTAL: 0.8 mg/dL (ref 0.3–1.2)
BUN: 14 mg/dL (ref 6–20)
CHLORIDE: 100 mmol/L — AB (ref 101–111)
CO2: 26 mmol/L (ref 22–32)
Calcium: 8.7 mg/dL — ABNORMAL LOW (ref 8.9–10.3)
Creatinine, Ser: 1.35 mg/dL — ABNORMAL HIGH (ref 0.61–1.24)
GFR calc Af Amer: >60 mL/min (ref >60)
GFR calc non Af Amer: >60 mL/min (ref >60)
GLUCOSE: 279 mg/dL — AB (ref 65–99)
POTASSIUM: 3.5 mmol/L (ref 3.5–5.1)
Sodium: 136 mmol/L (ref 135–145)
Total Protein: 6.7 g/dL (ref 6.5–8.1)

## 2017-05-02 LAB — BRAIN NATRIURETIC PEPTIDE: B NATRIURETIC PEPTIDE 5: 76.8 pg/mL (ref 0.0–100.0)

## 2017-05-02 MED ORDER — METOLAZONE 5 MG PO TABS: ORAL_TABLET | ORAL | 0 refills | Status: DC

## 2017-05-02 MED FILL — metOLazone 5 MG TABS: 5 | 10 days supply | Qty: 10 | Fill #0

## 2017-05-02 NOTE — Addendum Note (Signed)
Encounter addended by: Little Ishikawa, NP on: 05/02/2017 10:09 AM<BR>    Actions taken: Sign clinical note

## 2017-05-02 NOTE — Progress Notes (Signed)
Remote ICD transmission.   

## 2017-05-02 NOTE — Progress Notes (Signed)
EPIC Encounter for ICM Monitoring  Patient Name: Douglas [name] is a 40 y.o. male Date: 05/02/2017 Primary Care Physican: Alphonzo Grieve, MD Primary Cardiologist:Bensimhon Electrophysiologist: Faustino Congress Weight:does not weigh       Patient seen in HF clinic today.    Thoracic impedance abnormal suggesting fluid accumulation.  Prescribed and confirmed dosage: Torsemide 20 mg 4 tablets (80 mg total) twice a day. Potassium 20 mEq 2 tablets daily.   Labs: 01/05/2017 Creatinine 1.46, BUN 18, Potassium 4.1, Sodium 135, EGFR 58->60 10/04/2016 Creatinine 1.42, BUN 11, Potassium 3.3, Sodium 138, EGFR >60 09/23/2016 Creatinine 1.28, BUN 16, Potassium 3.7, Sodium 136, EGFR >60  06/01/2016 Creatinine 1.34, BUN 6, Potassium 3.4, Sodium 139, EGFR >60 01/09/2016 Creatinine 1.26, BUN 15, Potassium 3.3, Sodium 138, EGFR 70-81 05/07/2015 Creatinine 1.30, BUN 18, Potassium 3.5, Sodium 139        Recommendations:  Per Dr Claris Gladden chart note from today's visit, volume status elevated by Optivol due to dietary noncompliance, patient's weight is up 20 pounds.  Dr Aundra Dubin advised him to       take '5mg'$  of Metolazone today and tomorrow. Keep KCl supplement at 41mq daily and check BMET next week.   Follow-up plan: ICM clinic phone appointment on 05/05/2017 to check effectiveness of Metolazone.  Office appointment scheduled on 05/16/2017 with HF clinic.  Copy of ICM check sent to primary cardiologist and device physician.   3 month ICM trend: 05/02/2017   1 Year ICM trend:      LRosalene Billings RN 05/02/2017 2:38 PM

## 2017-05-02 NOTE — Progress Notes (Signed)
Advanced Heart Failure Medication Review by a Pharmacist  Does the patient  feel that his/her medications are working for him/her?  yes  Has the patient been experiencing any side effects to the medications prescribed?  no  Does the patient measure his/her own blood pressure or blood glucose at home?  no   Does the patient have any problems obtaining medications due to transportation or finances?   no  Understanding of regimen: good Understanding of indications: good Potential of compliance: good Patient understands to avoid NSAIDs. Patient understands to avoid decongestants.  Issues to address at subsequent visits: none.   Pharmacist comments: Mr. Schroepfer is a pleasant 41 y/o male who presents with no medication bottles or lists but good recollection of his medication regimen. Patient endorses good adherence to his medication regimen. Patient had no medication-related questions or concerns at this time.    Marlinda Mike PharmD Candidate  Time with patient: 10 minutes Preparation and documentation time: 10 minutes Total time: 20 minutes

## 2017-05-02 NOTE — Patient Instructions (Signed)
Take Metolazone 5 mg (1 tablet) once today and tomorrow ONLY. Take Torsemide as you normally would on these days.  Routine lab work today. Will notify you of abnormal results, otherwise no news is good news!  Return in 1 week for labs.  Follow up 2 weeks. Take all medication as prescribed the day of your appointment. Bring all medications with you to your appointment.  Do the following things EVERYDAY: 1) Weigh yourself in the morning before breakfast. Write it down and keep it in a log. 2) Take your medicines as prescribed 3) Eat low salt foods-Limit salt (sodium) to 2000 mg per day.  4) Stay as active as you can everyday 5) Limit all fluids for the day to less than 2 liters

## 2017-05-02 NOTE — Progress Notes (Addendum)
Patient ID: Douglas [name], male   DOB: 27-Sep-1976, 41 y.o.   MRN: [id number]  EP: Dr Graciela Husbands PCP: Dr. Johna Roles Primary Cardiologist: Dr. [name]  History of Present Illness: Douglas [name] is a 41 y.o. male with a h/o HTN, HL, obesity, NICM and chronic systolic heart failure. He is S/P Medtronic ICD due to NICM. Management has been complicated by severe non-compliance.   August 2012 CPX VO2 20  Underwent cath in 2006 which showed normal cors with EF 20-25%.   EF had recovered in late 2013 but has again fallen to 15% per echo 01/20/13.  He was admitted to [facility] 02/21/13 from clinic for low output symptoms and volume overload.  He required IV lasix and metolazone.  He diuresed 19 pounds with discharge weight of 254 pounds.  He had runs of VT therefore he was started on amiodarone per EP and Lifevest was placed at discharge.  He underwent RHC on 02/22/13.  He has not required metolazone.   He was admitted in 1/16 with VT and ICD shock x 2.   [name] returns today for HF follow up. He feels well today, trying to lose weight. He is walking daily about 15 minutes a day, no SOB. He is drinking well over 2L a day. Eating some high salt foods, but for the most part does well. Denies orthopnea and PND. Taking all medications. Weight up 20 pounds from last visit, has increased lower extremity edema.   Optivol: Volume elevated, no VT/VF, Activity level 3 hours a day.      RHC 02/22/13  RA = 12  RV = 49/6/15  PA = 54/29 (41)  PCW = 28 (v= 40)  Fick cardiac output/index = 4.3/1.7  PVR = 2.6 Woods  SVR = 1580 dynes  FA sat = 98%  PA sat = 53%, 54%  Non-invasive BP = 124/86 (97)  RHC 05/03/13 RA = 2  RV = 29/2/2  PA = 26/10 (17)  PCW = 3  Fick cardiac output/index = 6.1/2.3  PVR = 2.3  FA sat = 93%  PA sat = 65%, 67%  ECHO 06/25/13 EF 20-25% ECHO 05/01/14 EF 35-40%, moderately dilated LV, mild LVH, diffuse hypokinesis Echo 6/16 EF 25-30%, mild LVH, moderately dilated LV.  ECHO  09/23/2016 EF 25-30%.  Labs (10/15/13): K 3.9 Creatinine 0.99 Labs (3/15): K 4, creatinine 1.04, TSH normal, LFTs, normal Labs (12/10/2014): K 4.4 Creatinine 1.42   SH: lives with his Mom. Rare ETOH, smokes rarely. Remote cocaine.  FH: Mom has CAD.    ROS:  Please see the history of present illness.  He denies fevers, chills, melena, hematochezia.  All other systems reviewed and negative.  Past Medical History:  Diagnosis Date  . Arthritis    ra  . Automatic implantable cardioverter-defibrillator in situ   . Depression, major, single episode 01/14/2017  . Diabetes mellitus (HCC)   . ED (erectile dysfunction)   . Exertional shortness of breath   . History of syncope 01/29/2015  . Hypertension   . Nonischemic cardiomyopathy (HCC)    a.  echo 4/06: EF 30%, mild to mod MR, mild RAE, inf HK, lat HK , ant HK;    b.  cath 4/06: no CAD, EF 20-25%  . NSVT (nonsustained ventricular tachycardia) (HCC)    eval by EP in past; no ICD candidate due to NYHA 1 symptoms  . Obesity   . Systolic CHF, chronic (HCC)     Current Outpatient Prescriptions  Medication Sig Dispense Refill  . amiodarone (PACERONE) 200 MG tablet Take 1 tablet (200 mg total) by mouth daily. 30 tablet 5  . aspirin EC 81 MG EC tablet Take 1 tablet (81 mg total) by mouth daily. 30 tablet 0  . atorvastatin (LIPITOR) 40 MG tablet Take 1 tablet (40 mg total) by mouth daily. 30 tablet 11  . buPROPion (WELLBUTRIN XL) 150 MG 24 hr tablet Take 1 tablet (150 mg total) by mouth every morning. 30 tablet 2  . carvedilol (COREG) 12.5 MG tablet TAKE 1 TABLET BY MOUTH 2 TIMES DAILY WITH A MEAL. 60 tablet 2  . isosorbide-hydrALAZINE (BIDIL) 20-37.5 MG tablet Take 1 tablet by mouth 3 (three) times daily. 90 tablet 3  . metFORMIN (GLUCOPHAGE-XR) 500 MG 24 hr tablet Take 1 tablet (500 mg total) by mouth daily with breakfast. 90 tablet 1  . potassium chloride SA (K-DUR,KLOR-CON) 20 MEQ tablet Take 2 tablets (40 mEq total) by mouth daily. 60 tablet 3    . sacubitril-valsartan (ENTRESTO) 49-51 MG Take 1 tablet by mouth 2 (two) times daily. Needs office visit 60 tablet 3  . spironolactone (ALDACTONE) 25 MG tablet Take 0.5 tablets (12.5 mg total) by mouth daily. 45 tablet 3  . torsemide (DEMADEX) 20 MG tablet Take 20 mg by mouth daily. Take 4 tablets in the morning and 4 tablets in the evening     No current facility-administered medications for this encounter.     Allergies  Allergen Reactions  . Penicillins Other (See Comments)    Unknown childhood reaction     Vitals:   05/02/17 0858  BP: 114/76  Pulse: 82  SpO2: 96%  Weight: (!) 341 lb 12.8 oz (155 kg)   Wt Readings from Last 3 Encounters:  05/02/17 (!) 341 lb 12.8 oz (155 kg)  02/16/17 (!) 320 lb (145.2 kg)  01/14/17 (!) 329 lb 9.6 oz (149.5 kg)   PHYSICAL EXAM: General: Obese male, NAD.  HEENT: Normal, atraumatic.  Neck: supple. No thyromegaly. Hard to assess JVP with body habitus, but appears elevated. Carotids 2+ bilaterally.  No Bruits   Cardiac: PMI laterally displaced. Regular rate and rhythm. No S3 appreciated.  Lungs: Clear bilaterally. Normal effort.  Abdomen: Obese, soft, NT, ND, no HSM. No bruits or masses. +BS  Ext:  warm. no cyanosis or clubbing. No edema.  Skin: warm and dry Psych:Normal affect. Cranial nerves intact.   1) Chronic systolic HF: Nonischemic cardiomyopathy. Echo 09/2016 with EF 25-30%, RV systolic function mildly reduced.  S/P Medtronic ICD 08/2013.   - NYHA II - Volume status elevated by Optivol. Weight up 20 pounds in the setting of dietary noncompliance. Confirmed by Optivol.  - Take 5mg  of Metolazone today and tomorrow. Last K was 4.1. He is on . Will keep KCl supplement at [place] daily and check BMET next week.  - Continue torsemide 80mg  BID.  - BMET, BNP today.  - Continue Coreg 12.5mg  BID.  - Continue Bidil 1 tab TID.  - Continue Entresto 49/51 mg BID. Consider increasing next visit. Did not increase today as SBP is  114 and I am adding 2 days of metolazone.  - Continue Spiro 12.5mg  daily. Increase to 25mg  daily next visit.  - Reinforced fluid restriction to < 2 L daily, sodium restriction to less than 2000 mg daily, and the importance of daily weights.   - Told him specifically to cut back on his juice intake, drinking about 16 ounces a day.  2) HTN:  -  Well controlled on current regimen.  3) VT:  - Continue Amio 200mg  daily.    - Check LFT's and TSH today.  - No VT/VF by Optivol 4) Cocaine abuse:   - Continued cessation.  5) Tobacco Abuse - Encouraged cessation   CMET, TSH, BNP today. Repeat BMET next week and follow up in 2 weeks to evaluate volume status.    , NP  05/02/2017   Total time spent > 25 minutes. Over half that spent discussing the above.

## 2017-05-03 LAB — CUP PACEART REMOTE DEVICE CHECK
Brady Statistic RV Percent Paced: 0.01 %
Date Time Interrogation Session: 20180618041704
HIGH POWER IMPEDANCE MEASURED VALUE: 50 Ohm
HIGH POWER IMPEDANCE MEASURED VALUE: 71 Ohm
Implantable Lead Implant Date: 20141009
Implantable Lead Model: 7121
Lead Channel Impedance Value: 323 Ohm
Lead Channel Pacing Threshold Amplitude: 0.75 V
Lead Channel Sensing Intrinsic Amplitude: 12 mV
Lead Channel Sensing Intrinsic Amplitude: 12 mV
Lead Channel Setting Pacing Pulse Width: 0.4 ms
MDC IDC LEAD LOCATION: 753860
MDC IDC MSMT BATTERY REMAINING LONGEVITY: 103 mo
MDC IDC MSMT BATTERY VOLTAGE: 3.01 V
MDC IDC MSMT LEADCHNL RV IMPEDANCE VALUE: 456 Ohm
MDC IDC MSMT LEADCHNL RV PACING THRESHOLD PULSEWIDTH: 0.4 ms
MDC IDC PG IMPLANT DT: 20141009
MDC IDC SET LEADCHNL RV PACING AMPLITUDE: 2.5 V
MDC IDC SET LEADCHNL RV SENSING SENSITIVITY: 0.45 mV

## 2017-05-05 ENCOUNTER — Encounter: Payer: Self-pay | Admitting: Cardiology

## 2017-05-06 NOTE — Progress Notes (Signed)
No ICM remote transmission received for 05/05/2017 to recheck fluid levels and next ICM transmission scheduled for 06/02/2017.

## 2017-05-09 ENCOUNTER — Ambulatory Visit (HOSPITAL_COMMUNITY)
Admission: RE | Admit: 2017-05-09 | Discharge: 2017-05-09 | Disposition: A | Discharge status: Home / Self Care | Payer: Medicaid Other | Source: Ambulatory Visit | Attending: Cardiology | Admitting: Cardiology

## 2017-05-09 DIAGNOSIS — I5042 Chronic combined systolic (congestive) and diastolic (congestive) heart failure: Secondary | ICD-10-CM | POA: Diagnosis present

## 2017-05-09 LAB — BASIC METABOLIC PANEL
ANION GAP: 11 (ref 5–15)
BUN: 24 mg/dL — AB (ref 6–20)
CHLORIDE: 92 mmol/L — AB (ref 101–111)
CO2: 28 mmol/L (ref 22–32)
Calcium: 9.1 mg/dL (ref 8.9–10.3)
Creatinine, Ser: 1.59 mg/dL — ABNORMAL HIGH (ref 0.61–1.24)
GFR calc Af Amer: >60 mL/min (ref >60)
GFR, EST NON AFRICAN AMERICAN: 52 mL/min — AB (ref >60)
GLUCOSE: 394 mg/dL — AB (ref 65–99)
POTASSIUM: 3.6 mmol/L (ref 3.5–5.1)
Sodium: 131 mmol/L — ABNORMAL LOW (ref 135–145)

## 2017-05-16 ENCOUNTER — Encounter (HOSPITAL_COMMUNITY): Payer: Medicaid Other

## 2017-05-20 ENCOUNTER — Encounter: Payer: Self-pay | Admitting: Cardiology

## 2017-05-26 ENCOUNTER — Encounter (HOSPITAL_COMMUNITY): Payer: Medicaid Other

## 2017-05-27 ENCOUNTER — Other Ambulatory Visit (HOSPITAL_COMMUNITY): Payer: Self-pay | Admitting: Cardiology

## 2017-05-27 ENCOUNTER — Other Ambulatory Visit: Payer: Self-pay | Admitting: Internal Medicine

## 2017-05-27 MED FILL — METFORMIN HCL ER 500 MG TAB: 500 | 30 days supply | Qty: 30 | Fill #3

## 2017-05-27 MED FILL — SPIRONOLACTONE 25 MG TABLET: 25 | 30 days supply | Qty: 15 | Fill #0

## 2017-05-27 MED FILL — ENTRESTO 49 MG-51 MG TABLET: 49-51 | 30 days supply | Qty: 60 | Fill #1

## 2017-05-27 MED FILL — ATORVASTATIN 40 MG TABLET: 40 | 30 days supply | Qty: 30 | Fill #1

## 2017-05-27 MED FILL — BUPROPION HCL XL 150 MG TAB: 150 | 30 days supply | Qty: 30 | Fill #1

## 2017-05-27 MED FILL — BIDIL TABLET: 20-37.5 | 30 days supply | Qty: 90 | Fill #3

## 2017-05-27 MED FILL — AMIODARONE HCL 200 MG TAB: 200 | 60 days supply | Qty: 60 | Fill #0

## 2017-05-30 ENCOUNTER — Telehealth (HOSPITAL_COMMUNITY): Payer: Self-pay

## 2017-05-30 NOTE — Telephone Encounter (Signed)
Refill request received for patient in CHF clinic. Refill for torsemide 80 mg in am and 60 mg in pm. However, patient's chart states he should be taking 80 mg BID. Patient states he is doing 80 mg in am and 60 mg, weight stable, no swelling or SOB, and feels fluid is stable. Will refill for current diuretic regimen patient states he is on (that refill was originally for) and change how he is taking it in his chart. Patient also has no showed/cnacelled past two apts, will send to CHF schedulers to get an apt.  Ave Filter, RN

## 2017-05-31 MED FILL — TORSEMIDE 20 MG TABLET: 20 | 30 days supply | Qty: 210 | Fill #0

## 2017-06-01 MED FILL — CARVEDILOL 12.5 MG TABLET: 12.5 | 30 days supply | Qty: 60 | Fill #0

## 2017-06-02 ENCOUNTER — Telehealth: Payer: Self-pay | Admitting: Cardiology

## 2017-06-02 ENCOUNTER — Ambulatory Visit (INDEPENDENT_AMBULATORY_CARE_PROVIDER_SITE_OTHER): Payer: Medicaid Other

## 2017-06-02 DIAGNOSIS — I5042 Chronic combined systolic (congestive) and diastolic (congestive) heart failure: Secondary | ICD-10-CM

## 2017-06-02 DIAGNOSIS — Z95810 Presence of automatic (implantable) cardiac defibrillator: Secondary | ICD-10-CM

## 2017-06-02 DIAGNOSIS — Z4502 Encounter for adjustment and management of automatic implantable cardiac defibrillator: Secondary | ICD-10-CM

## 2017-06-02 NOTE — Telephone Encounter (Signed)
Spoke with pt and reminded pt of remote transmission that is due today. Pt verbalized understanding.   

## 2017-06-03 ENCOUNTER — Telehealth: Payer: Self-pay

## 2017-06-03 NOTE — Progress Notes (Signed)
EPIC Encounter for ICM Monitoring  Patient Name: Douglas [name] is a 41 y.o. male Date: 06/03/2017 Primary Care Physican: Alphonzo Grieve, MD Primary Cardiologist:Bensimhon Electrophysiologist: Faustino Congress Weight:does not weigh       Attempted call to patient and unable to reach.  Left detailed message regarding transmission.  Transmission reviewed.    Thoracic impedance normal.  Prescribed dosage: Torsemide 20 mg 4 tablets (80 mg total) every AM and 3 tablets (60 mg total) every PM. Potassium 20 mEq 2 tablets (40 mEq total) daily.   Labs: 05/09/2017 Creatinine 1.59, BUN 24, Potassium 3.6, Sodium 131, EGFR 52->60 05/02/2017 Creatinine 1.35, BUN 14, Potassium 3.5, Sodium 136, EGFR >60 01/05/2017 Creatinine 1.46, BUN 18, Potassium 4.1, Sodium 135, EGFR 58->60 10/04/2016 Creatinine 1.42, BUN 11, Potassium 3.3, Sodium 138, EGFR >60 09/23/2016 Creatinine 1.28, BUN 16, Potassium 3.7, Sodium 136, EGFR >60  06/01/2016 Creatinine 1.34, BUN 6, Potassium 3.4, Sodium 139, EGFR >60 01/09/2016 Creatinine 1.26, BUN 15, Potassium 3.3, Sodium 138, EGFR 70-81 05/07/2015 Creatinine 1.30, BUN 18, Potassium 3.5, Sodium 139  Recommendations: Left voice mail with ICM number and encouraged to call for fluid symptoms.  Follow-up plan: ICM clinic phone appointment on 07/04/2017.  Office appointment scheduled 06/17/2017 with Advanced HF clinic.  Copy of ICM check sent to device physician.   3 month ICM trend: 06/03/2017   1 Year ICM trend:      Rosalene Billings, RN 06/03/2017 9:23 AM

## 2017-06-03 NOTE — Telephone Encounter (Signed)
Remote ICM transmission received.  Attempted patient call and left detailed message regarding transmission and next ICM scheduled for 07/04/2017.  Advised to return call for any fluid symptoms or questions.    

## 2017-06-07 ENCOUNTER — Encounter: Payer: Self-pay | Admitting: Internal Medicine

## 2017-06-07 NOTE — Progress Notes (Signed)
CC: chest pain  HPI:  Douglas [name] is a 41 y.o. with a PMH of NICM s/p ICD, h/o VT, HTN, DM, cocaine and tobacco abuse; patient presents to clinic for a follow up of his diabetes and chest pain.  T2DM: A1c 8.3 in March 2018. He was started on Metformin ER 500mg  daily and states compliance with it; he denies abdominal pain or diarrhea. Bmet in June shows hyperglycemia up to 394. Patient endorses polydipsia, hard to say if he has polyuria with diuretic use. He endorses blurry vision at times and fatigue.  sCHF, NICM:  Last echo 11/17 shows EF 25-30%, has ICD. Patient seen by HF clinic and found to be volume overloaded likely 2/2 dietary indiscretion; he had ~20lb weight difference. He was told to take scheduled metolazone for 2 days and follow up in 2 weeks but has not done so yet; repeat Bmet shows Na 131, Cr 1.59. Dry weight ~325? Meds include coreg 12.5mg  BID, bidil TID, Entrestro 49/51mg  BID, torsemide 80mg  BID, spironolactone 12.5mg  daily and metolazone PRN. Patient endorses drinking about 4L of fluid a day; he has been out of coreg for a couple of weeks; he has not taken any potassium for about a month. He endorses LE edema, stable shortness of breath with exertion. For the last 3 days he has had intermittent episodes of central chest discomfort at rest lasting about 1-2 minutes and resolving on their own. Associated symptoms include dry mouth. He also reports feeling palpitations once this week that was similar to sensation of previous Vtach episodes; his ICD has not fired.  Please see problem based Assessment and Plan for status of patients chronic conditions.  Past Medical History:  Diagnosis Date  . Depression, major, single episode 01/14/2017  . Diabetes mellitus (HCC)   . ED (erectile dysfunction)   . History of syncope 01/29/2015  . Hypertension   . 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 Asymptomatic.   12/02/2014 Nonischemic cardiomyopathy  (HCC)    a.  echo 4/06: EF 30%, mild to mod MR, mild RAE, inf HK, lat HK , ant HK;    b.  cath 4/06: no CAD, EF 20-25%  . NSVT (nonsustained ventricular tachycardia) (HCC)   . Obesity   . Systolic CHF, chronic (HCC)    EF 11/17 25-30%, s/p ICD    Review of Systems:   Review of Systems  Constitutional: Positive for malaise/fatigue. Negative for chills, fever and weight loss.  HENT: Negative for hearing loss.   Eyes: Positive for blurred vision. Negative for double vision.  Respiratory: Positive for shortness of breath. Negative for cough.   Cardiovascular: Positive for chest pain, palpitations and leg swelling. Negative for orthopnea.  Gastrointestinal: Negative for abdominal pain, constipation, diarrhea, nausea and vomiting.  Genitourinary: Positive for frequency. Negative for dysuria, hematuria and urgency.  Neurological: Negative for dizziness, tingling, focal weakness, loss of consciousness and headaches.  Endo/Heme/Allergies: Positive for polydipsia.  Psychiatric/Behavioral: Positive for depression.    Physical Exam:  Vitals:   06/08/17 1400  BP: 138/77  Pulse: 84  Temp: 98.8 F (37.1 C)  TempSrc: Oral  SpO2: 98%  Weight: (!) 338 lb 1.6 oz (153.4 kg)  Height: 6\' 4"  (1.93 m)   Physical Exam  Constitutional: He is oriented to person, place, and time. He appears well-developed and well-nourished. No distress.  HENT:  Head: Normocephalic and atraumatic.  Eyes: Conjunctivae and EOM are normal.  Neck: Normal range of motion. Neck supple.  Cardiovascular: Normal  rate, regular rhythm, normal heart sounds and intact distal pulses.  Exam reveals no gallop and no friction rub.   No murmur heard. Pulmonary/Chest: Effort normal and breath sounds normal. He has no wheezes. He has no rales. He exhibits no tenderness.  Abdominal: Soft. Bowel sounds are normal. There is no tenderness. There is no guarding.  Musculoskeletal: Normal range of motion. He exhibits edema (1+ pitting edema  to mid tibia bilaterally).  Neurological: He is alert and oriented to person, place, and time. No cranial nerve deficit.  Skin: Skin is warm and dry. Capillary refill takes less than 2 seconds. No rash noted. He is not diaphoretic. No erythema. No pallor.  Psychiatric: His behavior is normal. Judgment and thought content normal.    Assessment & Plan:   See Encounters Tab for problem based charting.   Patient discussed with Dr. Argentina Ponder, MD Internal Medicine PGY2

## 2017-06-08 ENCOUNTER — Ambulatory Visit (INDEPENDENT_AMBULATORY_CARE_PROVIDER_SITE_OTHER): Payer: Medicaid Other | Admitting: Internal Medicine

## 2017-06-08 ENCOUNTER — Encounter: Payer: Self-pay | Admitting: Internal Medicine

## 2017-06-08 ENCOUNTER — Ambulatory Visit (HOSPITAL_COMMUNITY)
Admission: RE | Admit: 2017-06-08 | Discharge: 2017-06-08 | Disposition: A | Discharge status: Home / Self Care | Payer: Medicaid Other | Source: Ambulatory Visit | Attending: Internal Medicine | Admitting: Internal Medicine

## 2017-06-08 VITALS — BP 138/77 | HR 84 | Temp 98.8°F | Ht 76.0 in | Wt 338.1 lb

## 2017-06-08 DIAGNOSIS — I5042 Chronic combined systolic (congestive) and diastolic (congestive) heart failure: Secondary | ICD-10-CM

## 2017-06-08 DIAGNOSIS — I10 Essential (primary) hypertension: Secondary | ICD-10-CM

## 2017-06-08 DIAGNOSIS — F321 Major depressive disorder, single episode, moderate: Secondary | ICD-10-CM | POA: Diagnosis not present

## 2017-06-08 DIAGNOSIS — I130 Hypertensive heart and chronic kidney disease with heart failure and stage 1 through stage 4 chronic kidney disease, or unspecified chronic kidney disease: Secondary | ICD-10-CM | POA: Diagnosis not present

## 2017-06-08 DIAGNOSIS — I429 Cardiomyopathy, unspecified: Secondary | ICD-10-CM | POA: Diagnosis not present

## 2017-06-08 DIAGNOSIS — I129 Hypertensive chronic kidney disease with stage 1 through stage 4 chronic kidney disease, or unspecified chronic kidney disease: Secondary | ICD-10-CM | POA: Diagnosis not present

## 2017-06-08 DIAGNOSIS — Z720 Tobacco use: Secondary | ICD-10-CM | POA: Diagnosis not present

## 2017-06-08 DIAGNOSIS — I5022 Chronic systolic (congestive) heart failure: Secondary | ICD-10-CM | POA: Diagnosis not present

## 2017-06-08 DIAGNOSIS — E1122 Type 2 diabetes mellitus with diabetic chronic kidney disease: Secondary | ICD-10-CM

## 2017-06-08 DIAGNOSIS — N182 Chronic kidney disease, stage 2 (mild): Secondary | ICD-10-CM

## 2017-06-08 DIAGNOSIS — R9431 Abnormal electrocardiogram [ECG] [EKG]: Secondary | ICD-10-CM | POA: Insufficient documentation

## 2017-06-08 DIAGNOSIS — Z794 Long term (current) use of insulin: Secondary | ICD-10-CM | POA: Diagnosis not present

## 2017-06-08 LAB — GLUCOSE, CAPILLARY: Glucose-Capillary: 327 mg/dL — ABNORMAL HIGH (ref 65–99)

## 2017-06-08 LAB — POCT GLYCOSYLATED HEMOGLOBIN (HGB A1C): HEMOGLOBIN A1C: 13.6

## 2017-06-08 MED ORDER — ACCU-CHEK NANO SMARTVIEW W/DEVICE KIT: 1.0000 [IU] | PACK | Freq: Once | 0 refills | Status: AC

## 2017-06-08 MED ORDER — LANCETS 30G MISC: 1.0000 | Freq: Four times a day (QID) | 5 refills | Status: DC

## 2017-06-08 MED ORDER — POTASSIUM CHLORIDE CRYS ER 20 MEQ PO TBCR
40.0000 meq | EXTENDED_RELEASE_TABLET | Freq: Every day | ORAL | 3 refills | Status: DC
Start: 2017-06-08 — End: 2017-11-17

## 2017-06-08 MED ORDER — INSULIN GLARGINE 100 UNIT/ML SOLOSTAR PEN: 20.0000 [IU] | PEN_INJECTOR | Freq: Every day | SUBCUTANEOUS | 1 refills | Status: DC

## 2017-06-08 MED ORDER — GLUCOSE BLOOD VI STRP: ORAL_STRIP | 12 refills | Status: DC

## 2017-06-08 MED ORDER — INSULIN PEN NEEDLE 32G X 4 MM MISC: 1.0000 "application " | Freq: Every day | 3 refills | Status: DC

## 2017-06-08 MED ORDER — METFORMIN HCL ER 500 MG PO TB24: 500.0000 mg | ORAL_TABLET | Freq: Two times a day (BID) | ORAL | 1 refills | Status: DC

## 2017-06-08 MED ORDER — BUPROPION HCL ER (XL) 150 MG PO TB24: 150.0000 mg | ORAL_TABLET | ORAL | 2 refills | Status: DC

## 2017-06-08 MED FILL — UNIFINE PENTIPS 32GX5/32: 32G X 4 MM | 30 days supply | Qty: 100 | Fill #0

## 2017-06-08 MED FILL — ACCU-CHEK FASTCLIX LANCETS: 25 days supply | Qty: 102 | Fill #0

## 2017-06-08 MED FILL — UNIFINE PENTIPS 32GX5/32": 32G X 4 MM | 30 days supply | Qty: 100 | Fill #0

## 2017-06-08 MED FILL — POTASSIUM CL ER 20 MEQ TAB: 20 | 30 days supply | Qty: 60 | Fill #0

## 2017-06-08 MED FILL — LANTUS SOLOSTAR 100 UNITS/M: 100 | 30 days supply | Qty: 6 | Fill #0

## 2017-06-08 MED FILL — ACCU-CHEK SMARTVIEW STRIP: 33 days supply | Qty: 100 | Fill #0

## 2017-06-08 NOTE — Assessment & Plan Note (Addendum)
Well controlled on current therapy - spironolactone 12.5mg  daily, Bidil TID, carvedilol 12.5mg  BID, entresto 49/51mg  BID.  Plan: Continue current therapy

## 2017-06-08 NOTE — Assessment & Plan Note (Addendum)
Last echo 11/17 shows EF 25-30%, has ICD. Patient seen by HF clinic and found to be volume overloaded likely 2/2 dietary indiscretion; he had ~20lb weight difference. He was told to take scheduled metolazone for 2 days and follow up in 2 weeks but has not done so yet; repeat Bmet shows Na 131, Cr 1.59. Dry weight ~325? Meds include coreg 12.5mg  BID, bidil TID, Entrestro 49/51mg  BID, torsemide 80mg  BID, spironolactone 12.5mg  daily and metolazone PRN. Patient endorses drinking about 4L of fluid a day; he has been out of coreg for a couple of weeks; he has not taken any potassium for about a month. He endorses LE edema, stable shortness of breath with exertion.   He has had chest pain intermittently for last three days; also one episode of palpitations similar to prior Vtach episodes.  Weight today is 338lbs. Lungs sound clear, and he has LE edema. His palpitations could be due to electrolyte imbalances as he has not been taking potassium while maintaining diuretic use. He has f/u with [facility] on Aug [facility].  EKG was obtained in clinic - personally reviewed - NSR, PVC, prolonged QTc, no acute ST or T wave changes, no q waves  Plan: --obtain Bmet --asked patient to restart potassium --will not make any changes to current regimen --refill carvedilol --advised on <2L fluid intake per day; low sodium diet --patient to follow up with [facility] next week

## 2017-06-08 NOTE — Assessment & Plan Note (Addendum)
Patient states he was off of wellbutrin for a couple of months due to not being able to afford it; he just started it back up. He states it did help him cut down on smoking. Was not able to assess for efficacy due to not taking for long enough recently.  Plan: --refilled wellbutrin 150mg  daily

## 2017-06-08 NOTE — Assessment & Plan Note (Signed)
Now smoking about 3 cigarettes a day and is hopefull he will stop completely. Main barrier is habitual smoking after meals.

## 2017-06-08 NOTE — Assessment & Plan Note (Addendum)
A1c 8.3 in March 2018. He was started on Metformin ER 500mg  daily and states compliance with it; he denies abdominal pain or diarrhea. Bmet in June shows hyperglycemia up to 394. Patient endorses polydipsia, hard to say if he has polyuria with diuretic use. He endorses blurry vision at times and fatigue; he denies abdominal pain, nausea, vomiting.  Plan: --check A1c - 13.6 --increase metformin ER to 500mg  BID --start Lantus 20 units qhs (0.1-0.2 units/kg - patient is insulin naive) --sample of Lantus pen provided for patient; education by pharmacy given on administration, monitoring --advised patient to check CBGs in AM fasting and 2-3 times rest of day --advised on symptoms and treatment of hypoglycemia --meter, lancets, strips, and needles ordered --f/u in 2 weeks in [facility], asked to bring in meter and log  Addendum: Bmet shows stable renal function, K is 3.6, bicarb 24, no anion gap

## 2017-06-08 NOTE — Patient Instructions (Addendum)
Please start taking Lantus 20 units at night.   Please check your blood sugar first thing in the morning and a couple of times a day and bring your glucometer with you to each appointment.  Please increase your metformin to twice a day with meals.  Please set up an appointment to be seen by [place] in 2 weeks to check on how the lantus is working.   Start taking Potassium; I will call you with your blood work results and let you know if we need to change that.   Our pharmacists are working on trying to get your copays covered for medicines through a mail in pharmacy - they will let you know if it is possible.      Hypoglycemia Hypoglycemia is when the sugar (glucose) level in the blood is too low. Symptoms of low blood sugar may include:  Feeling: ? Hungry. ? Worried or nervous (anxious). ? Sweaty and clammy. ? Confused. ? Dizzy. ? Sleepy. ? Sick to your stomach (nauseous).  Having: ? A fast heartbeat. ? A headache. ? A change in your vision. ? Jerky movements that you cannot control (seizure). ? Nightmares. ? Tingling or no feeling (numbness) around the mouth, lips, or tongue.  Having trouble with: ? Talking. ? Paying attention (concentrating). ? Moving (coordination). ? Sleeping.  Shaking.  Passing out (fainting).  Getting upset easily (irritability).  Low blood sugar can happen to people who have diabetes and people who do not have diabetes. Low blood sugar can happen quickly, and it can be an emergency. Treating Low Blood Sugar Low blood sugar is often treated by eating or drinking something sugary right away. If you can think clearly and swallow safely, follow the 15:15 rule:  Take 15 grams of a fast-acting carb (carbohydrate). Some fast-acting carbs are: ? 1 tube of glucose gel. ? 3 sugar tablets (glucose pills). ? 6-8 pieces of hard candy. ? 4 oz (120 mL) of fruit juice. ? 4 oz (120 mL) of regular (not diet) soda.  Check your blood sugar 15 minutes after  you take the carb.  If your blood sugar is still at or below 70 mg/dL (3.9 mmol/L), take 15 grams of a carb again.  If your blood sugar does not go above 70 mg/dL (3.9 mmol/L) after 3 tries, get help right away.  After your blood sugar goes back to normal, eat a meal or a snack within 1 hour.  Treating Very Low Blood Sugar If your blood sugar is at or below 54 mg/dL (3 mmol/L), you have very low blood sugar (severe hypoglycemia). This is an emergency. Do not wait to see if the symptoms will go away. Get medical help right away. Call your local emergency services (911 in the [place] Do not drive yourself to the hospital. If you have very low blood sugar and you cannot eat or drink, you may need a glucagon shot (injection). A family member or friend should learn how to check your blood sugar and how to give you a glucagon shot. Ask your doctor if you need to have a glucagon shot kit at home. Follow these instructions at home: General instructions  Avoid any diets that cause you to not eat enough food. Talk with your doctor before you start any new diet.  Take over-the-counter and prescription medicines only as told by your doctor.  Limit alcohol to no more than 1 drink per day for nonpregnant women and 2 drinks per day for men. One drink equals  12 oz of beer, 5 oz of wine, or 1 oz of hard liquor.  Keep all follow-up visits as told by your doctor. This is important. If You Have Diabetes:   Make sure you know the symptoms of low blood sugar.  Always keep a source of sugar with you, such as: ? Sugar. ? Sugar tablets. ? Glucose gel. ? Fruit juice. ? Regular soda (not diet soda). ? Milk. ? Hard candy. ? Honey.  Take your medicines as told.  Follow your exercise and meal plan. ? Eat on time. Do not skip meals. ? Follow your sick day plan when you cannot eat or drink normally. Make this plan ahead of time with your doctor.  Check your blood sugar as often as told by your doctor.  Always check before and after exercise.  Share your diabetes care plan with: ? Your work or school. ? People you live with.  Check your pee (urine) for ketones: ? When you are sick. ? As told by your doctor.  Carry a card or wear jewelry that says you have diabetes. If You Have Low Blood Sugar From Other Causes:   Check your blood sugar as often as told by your doctor.  Follow instructions from your doctor about what you cannot eat or drink. Contact a doctor if:  You have trouble keeping your blood sugar in your target range.  You have low blood sugar often. Get help right away if:  You still have symptoms after you eat or drink something sugary.  Your blood sugar is at or below 54 mg/dL (3 mmol/L).  You have jerky movements that you cannot control.  You pass out. These symptoms may be an emergency. Do not wait to see if the symptoms will go away. Get medical help right away. Call your local emergency services (911 in the [place] Do not drive yourself to the hospital. This information is not intended to replace advice given to you by your health care provider. Make sure you discuss any questions you have with your health care provider. Document Released: 01/26/2010 Document Revised: 04/08/2016 Document Reviewed: 12/05/2015 Elsevier Interactive Patient Education  [employer].

## 2017-06-09 LAB — BMP8+ANION GAP
Anion Gap: 14 mmol/L (ref 10.0–18.0)
BUN/Creatinine Ratio: 13 (ref 9–20)
BUN: 21 mg/dL (ref 6–24)
CALCIUM: 9.1 mg/dL (ref 8.7–10.2)
CO2: 25 mmol/L (ref 20–29)
CREATININE: 1.59 mg/dL — AB (ref 0.76–1.27)
Chloride: 95 mmol/L — ABNORMAL LOW (ref 96–106)
GFR calc Af Amer: 61 mL/min/{1.73_m2} (ref >59)
GFR, EST NON AFRICAN AMERICAN: 53 mL/min/{1.73_m2} — AB (ref >59)
Glucose: 334 mg/dL — ABNORMAL HIGH (ref 65–99)
POTASSIUM: 3.6 mmol/L (ref 3.5–5.2)
SODIUM: 134 mmol/L (ref 134–144)

## 2017-06-09 NOTE — Progress Notes (Signed)
Medicine attending: Medical history, presenting problems, physical findings, and medications, reviewed with resident physician Dr Gorica Svalina on the day of the patient visit and I concur with her evaluation and management plan. 

## 2017-06-16 MED FILL — ACCU-CHEK NANO SMARTVIEW ME: W/DEVICE | 20 days supply | Qty: 1 | Fill #0

## 2017-06-17 ENCOUNTER — Ambulatory Visit (HOSPITAL_COMMUNITY)
Admission: RE | Admit: 2017-06-17 | Discharge: 2017-06-17 | Disposition: A | Discharge status: Home / Self Care | Payer: Medicaid Other | Source: Ambulatory Visit | Attending: Internal Medicine | Admitting: Internal Medicine

## 2017-06-17 VITALS — BP 116/82 | HR 82 | Wt 331.2 lb

## 2017-06-17 DIAGNOSIS — I110 Hypertensive heart disease with heart failure: Secondary | ICD-10-CM | POA: Insufficient documentation

## 2017-06-17 DIAGNOSIS — I428 Other cardiomyopathies: Secondary | ICD-10-CM

## 2017-06-17 DIAGNOSIS — Z95810 Presence of automatic (implantable) cardiac defibrillator: Secondary | ICD-10-CM | POA: Diagnosis not present

## 2017-06-17 DIAGNOSIS — I472 Ventricular tachycardia, unspecified: Secondary | ICD-10-CM

## 2017-06-17 DIAGNOSIS — Z9119 Patient's noncompliance with other medical treatment and regimen: Secondary | ICD-10-CM | POA: Diagnosis not present

## 2017-06-17 DIAGNOSIS — I429 Cardiomyopathy, unspecified: Secondary | ICD-10-CM | POA: Diagnosis not present

## 2017-06-17 DIAGNOSIS — Z7982 Long term (current) use of aspirin: Secondary | ICD-10-CM | POA: Diagnosis not present

## 2017-06-17 DIAGNOSIS — Z4502 Encounter for adjustment and management of automatic implantable cardiac defibrillator: Secondary | ICD-10-CM

## 2017-06-17 DIAGNOSIS — I5022 Chronic systolic (congestive) heart failure: Secondary | ICD-10-CM | POA: Diagnosis present

## 2017-06-17 DIAGNOSIS — I10 Essential (primary) hypertension: Secondary | ICD-10-CM

## 2017-06-17 DIAGNOSIS — E785 Hyperlipidemia, unspecified: Secondary | ICD-10-CM | POA: Insufficient documentation

## 2017-06-17 DIAGNOSIS — I5042 Chronic combined systolic (congestive) and diastolic (congestive) heart failure: Secondary | ICD-10-CM | POA: Diagnosis not present

## 2017-06-17 DIAGNOSIS — E669 Obesity, unspecified: Secondary | ICD-10-CM | POA: Insufficient documentation

## 2017-06-17 DIAGNOSIS — F17210 Nicotine dependence, cigarettes, uncomplicated: Secondary | ICD-10-CM | POA: Diagnosis not present

## 2017-06-17 DIAGNOSIS — Z79899 Other long term (current) drug therapy: Secondary | ICD-10-CM | POA: Insufficient documentation

## 2017-06-17 DIAGNOSIS — Z6841 Body Mass Index (BMI) 40.0 and over, adult: Secondary | ICD-10-CM | POA: Insufficient documentation

## 2017-06-17 DIAGNOSIS — Z794 Long term (current) use of insulin: Secondary | ICD-10-CM | POA: Insufficient documentation

## 2017-06-17 DIAGNOSIS — E119 Type 2 diabetes mellitus without complications: Secondary | ICD-10-CM | POA: Diagnosis not present

## 2017-06-17 DIAGNOSIS — Z880 Allergy status to penicillin: Secondary | ICD-10-CM | POA: Diagnosis not present

## 2017-06-17 MED ORDER — SPIRONOLACTONE 25 MG PO TABS: 25.0000 mg | ORAL_TABLET | Freq: Every day | ORAL | 3 refills | Status: DC

## 2017-06-17 NOTE — Patient Instructions (Signed)
INCREASE Spironolactone to 25 mg, one tab daily at bedtime  Your physician recommends that you schedule a follow-up appointment in: 2 months with Dr [name] and echo  Your physician has requested that you have an echocardiogram. Echocardiography is a painless test that uses sound waves to create images of your heart. It provides your doctor with information about the size and shape of your heart and how well your heart's chambers and valves are working. This procedure takes approximately one hour. There are no restrictions for this procedure.  Do the following things EVERYDAY: 1) Weigh yourself in the morning before breakfast. Write it down and keep it in a log. 2) Take your medicines as prescribed 3) Eat low salt foods-Limit salt (sodium) to 2000 mg per day.  4) Stay as active as you can everyday 5) Limit all fluids for the day to less than 2 liters

## 2017-06-17 NOTE — Progress Notes (Signed)
Patient ID: Douglas [name], male   DOB: 1976/01/11, 41 y.o.   MRN: [id number]  EP: Dr Graciela Husbands PCP: Dr. Johna Roles Primary Cardiologist: Dr. [name]  History of Present Illness: Douglas [name] is a 41 y.o. male with a h/o HTN, HL, obesity, NICM and chronic systolic heart failure. He is S/P Medtronic ICD due to NICM. Management has been complicated by severe non-compliance.   August 2012 CPX VO2 20  Underwent cath in 2006 which showed normal cors with EF 20-25%.   EF had recovered in late 2013 but has again fallen to 15% per echo 01/20/13.  He was admitted to [facility] 02/21/13 from clinic for low output symptoms and volume overload.  He required IV lasix and metolazone.  He diuresed 19 pounds with discharge weight of 254 pounds.  He had runs of VT therefore he was started on amiodarone per EP and Lifevest was placed at discharge.  He underwent RHC on 02/22/13.  He has not required metolazone.   He was admitted in 1/16 with VT and ICD shock x 2.   He returns today for HF follow up. Weight down 10 pounds last visit. He has been watching his diet strictly. Taking all medications. Not SOB with walking in the neighborhood, working out in his house without SOB. Denies orthopnea, PND. Denies chest pain. Drinking less than 2L a day. Smoking two cigarettes a day. Not drinking ETOH, Not using cocaine.   Optivol: Volume dry, impedence up. Activity 4 hours a day.      RHC 02/22/13  RA = 12  RV = 49/6/15  PA = 54/29 (41)  PCW = 28 (v= 40)  Fick cardiac output/index = 4.3/1.7  PVR = 2.6 Woods  SVR = 1580 dynes  FA sat = 98%  PA sat = 53%, 54%  Non-invasive BP = 124/86 (97)  RHC 05/03/13 RA = 2  RV = 29/2/2  PA = 26/10 (17)  PCW = 3  Fick cardiac output/index = 6.1/2.3  PVR = 2.3  FA sat = 93%  PA sat = 65%, 67%  ECHO 06/25/13 EF 20-25% ECHO 05/01/14 EF 35-40%, moderately dilated LV, mild LVH, diffuse hypokinesis Echo 6/16 EF 25-30%, mild LVH, moderately dilated LV.  ECHO 09/23/2016 EF  25-30%.  Labs (10/15/13): K 3.9 Creatinine 0.99 Labs (3/15): K 4, creatinine 1.04, TSH normal, LFTs, normal Labs (12/10/2014): K 4.4 Creatinine 1.42   SH: lives with his Mom. Rare ETOH, smokes rarely. Remote cocaine.  FH: Mom has CAD.    ROS:  Please see the history of present illness.  He denies fevers, chills, melena, hematochezia.  All other systems reviewed and negative.  Past Medical History:  Diagnosis Date  . Depression, major, single episode 01/14/2017  . Diabetes mellitus (HCC)   . ED (erectile dysfunction)   . History of syncope 01/29/2015  . Hypertension   . 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 Asymptomatic.   Marland Kitchen Nonischemic cardiomyopathy (HCC)    a.  echo 4/06: EF 30%, mild to mod MR, mild RAE, inf HK, lat HK , ant HK;    b.  cath 4/06: no CAD, EF 20-25%  . NSVT (nonsustained ventricular tachycardia) (HCC)   . Obesity   . Systolic CHF, chronic (HCC)    EF 11/17 25-30%, s/p ICD    Current Outpatient Prescriptions  Medication Sig Dispense Refill  . amiodarone (PACERONE) 200 MG tablet Take 1 tablet (200 mg total) by mouth daily. 30 tablet 5  .  aspirin EC 81 MG EC tablet Take 1 tablet (81 mg total) by mouth daily. 30 tablet 0  . atorvastatin (LIPITOR) 40 MG tablet Take 1 tablet (40 mg total) by mouth daily. 30 tablet 11  . buPROPion (WELLBUTRIN XL) 150 MG 24 hr tablet Take 1 tablet (150 mg total) by mouth every morning. 30 tablet 2  . carvedilol (COREG) 12.5 MG tablet TAKE 1 TABLET BY MOUTH 2 TIMES DAILY WITH A MEAL. 60 tablet 2  . glucose blood test strip The patient is insulin requiring, ICD 10 code E11.9. The patient tests 3 times per day. 100 each 12  . Insulin Glargine (LANTUS) 100 UNIT/ML Solostar Pen Inject 20 Units into the skin daily at 10 pm. 15 mL 1  . Insulin Pen Needle (BD PEN NEEDLE NANO U/F) 32G X 4 MM MISC Inject 1 application into the skin at bedtime. The patient is insulin requiring, ICD 10 code E11.9. The patient  injects 1 times per day. 100 each 3  . isosorbide-hydrALAZINE (BIDIL) 20-37.5 MG tablet Take 1 tablet by mouth 3 (three) times daily. 90 tablet 3  . Lancets 30G MISC 1 applicator by Does not apply route 4 (four) times daily. The patient is insulin requiring, ICD 10 code E11.9.The patient tests 4x per day. 100 each 5  . metFORMIN (GLUCOPHAGE-XR) 500 MG 24 hr tablet Take 1 tablet (500 mg total) by mouth 2 (two) times daily with a meal. 180 tablet 1  . potassium chloride SA (K-DUR,KLOR-CON) 20 MEQ tablet Take 2 tablets (40 mEq total) by mouth daily. 60 tablet 3  . sacubitril-valsartan (ENTRESTO) 49-51 MG Take 1 tablet by mouth 2 (two) times daily. Needs office visit 60 tablet 3  . spironolactone (ALDACTONE) 25 MG tablet Take 0.5 tablets (12.5 mg total) by mouth daily. 45 tablet 3  . torsemide (DEMADEX) 20 MG tablet TAKE 4 TABLETS BY MOUTH IN THE MORNING AND 3 TABLETS IN EVENING 210 tablet 3  . metolazone (ZAROXOLYN) 5 MG tablet Take only as directed by CHF clinic [phone]) (Patient not taking: Reported on 06/17/2017) 10 tablet 0   No current facility-administered medications for this encounter.     Allergies  Allergen Reactions  . Penicillins Other (See Comments)    Unknown childhood reaction     Vitals:   06/17/17 1110  BP: 116/82  Pulse: 82  SpO2: 97%  Weight: (!) 331 lb 3.2 oz (150.2 kg)   Wt Readings from Last 3 Encounters:  06/17/17 (!) 331 lb 3.2 oz (150.2 kg)  06/08/17 (!) 338 lb 1.6 oz (153.4 kg)  05/02/17 (!) 341 lb 12.8 oz (155 kg)   PHYSICAL EXAM: General: Obese male, NAD.  HEENT: Normal.  Neck: supple. No thyromegaly. JVP flat.  Carotids 2+ bilaterally.  No Bruits   Cardiac: PMI laterally displaced. Regular rate and rhythm. No S3 appreciated.  Lungs: Clear bilaterally. Normal effort.  Abdomen: Obese, soft, non tender, non distended. no HSM. No bruits or masses. +BS  Ext:  warm. no cyanosis or clubbing. No peripheral edema.  Skin: warm and dry Psych:Normal affect.  Cranial nerves intact.   1) Chronic systolic HF: Nonischemic cardiomyopathy. Echo 09/2016 with EF 25-30%, RV systolic function mildly reduced.  S/P Medtronic ICD 08/2013.   - NYHA II - He has improved greatly over the past 2 months, developed a diet and exercise plan and has been compliant. Weight down 10 pounds. Activity level up by Optivol. He feels great.  - Continue torsemide 80mg /40mg . I have asked him to  reduce to 40/40mg  if he develops dizziness.  - Continue Coreg 12.5 mg BID - Increase Spiro to 25 mg to take a bedtime - Continue Bidil TID - Continue Entresto 49/51 mg BID - Congratulated him on his progress. He was very positive today and wishes to continue to follow a low salt diet and stay active.   2) HTN:  - Well controlled on current regimen.   3) VT:  - Continue amio 200 mg daily.  - Recent LFT's stable.  - No VT/VF by Optivol.  4) Cocaine abuse:   - Has been clean for one year.   5) Tobacco Abuse - Smoking 2 cigarettes a day.  - Enocouraged cessation    He is due for an Echo, will have him do this in 2 months and see Dr. [name] then. Will recheck BMET next week after increasing Cleda Daub.    Little Ishikawa, NP  06/17/2017

## 2017-06-17 NOTE — Progress Notes (Signed)
Advanced Heart Failure Medication Review by a Pharmacist  Does the patient  feel that his/her medications are working for him/her?  yes  Has the patient been experiencing any side effects to the medications prescribed?  no  Does the patient measure his/her own blood pressure or blood glucose at home?  yes   Does the patient have any problems obtaining medications due to transportation or finances?   no  Understanding of regimen: good Understanding of indications: good Potential of compliance: good Patient understands to avoid NSAIDs. Patient understands to avoid decongestants.  Issues to address at subsequent visits: None   Pharmacist comments: Douglas [name] is a pleasant 41 yo M presenting without a medication list but with good recall of his regimen. He reports good compliance with his regimen. He reports that his BG is usually in the 200-300s fasting and I have advised him to contact his PCP for recommendations on possible Lantus dose changes. He did not have any other medication-related questions or concerns for me at this time.   [name]. Bonnye Fava, PharmD, BCPS, CPP Clinical Pharmacist Pager: [phone] Phone: [phone] 06/17/2017 11:18 AM      Time with patient: 10 minutes Preparation and documentation time: 2 minutes Total time: 12 minutes

## 2017-06-24 ENCOUNTER — Other Ambulatory Visit (HOSPITAL_COMMUNITY): Payer: Self-pay | Admitting: *Deleted

## 2017-06-24 ENCOUNTER — Ambulatory Visit (INDEPENDENT_AMBULATORY_CARE_PROVIDER_SITE_OTHER): Payer: Medicaid Other | Admitting: Internal Medicine

## 2017-06-24 ENCOUNTER — Encounter: Payer: Self-pay | Admitting: Internal Medicine

## 2017-06-24 ENCOUNTER — Ambulatory Visit (HOSPITAL_COMMUNITY)
Admission: RE | Admit: 2017-06-24 | Discharge: 2017-06-24 | Disposition: A | Discharge status: Home / Self Care | Payer: Medicaid Other | Source: Ambulatory Visit | Attending: Internal Medicine | Admitting: Internal Medicine

## 2017-06-24 VITALS — BP 118/63 | HR 69 | Temp 98.0°F | Ht 76.0 in | Wt 336.9 lb

## 2017-06-24 DIAGNOSIS — Z794 Long term (current) use of insulin: Secondary | ICD-10-CM | POA: Diagnosis not present

## 2017-06-24 DIAGNOSIS — Z833 Family history of diabetes mellitus: Secondary | ICD-10-CM

## 2017-06-24 DIAGNOSIS — N182 Chronic kidney disease, stage 2 (mild): Secondary | ICD-10-CM

## 2017-06-24 DIAGNOSIS — E1122 Type 2 diabetes mellitus with diabetic chronic kidney disease: Secondary | ICD-10-CM | POA: Diagnosis present

## 2017-06-24 DIAGNOSIS — I5042 Chronic combined systolic (congestive) and diastolic (congestive) heart failure: Secondary | ICD-10-CM

## 2017-06-24 DIAGNOSIS — Z6841 Body Mass Index (BMI) 40.0 and over, adult: Secondary | ICD-10-CM

## 2017-06-24 DIAGNOSIS — E669 Obesity, unspecified: Secondary | ICD-10-CM

## 2017-06-24 DIAGNOSIS — F17210 Nicotine dependence, cigarettes, uncomplicated: Secondary | ICD-10-CM

## 2017-06-24 LAB — BASIC METABOLIC PANEL
ANION GAP: 14 (ref 5–15)
BUN: 17 mg/dL (ref 6–20)
CALCIUM: 9 mg/dL (ref 8.9–10.3)
CO2: 21 mmol/L — AB (ref 22–32)
Chloride: 101 mmol/L (ref 101–111)
Creatinine, Ser: 1.29 mg/dL — ABNORMAL HIGH (ref 0.61–1.24)
GFR calc Af Amer: >60 mL/min (ref >60)
GFR calc non Af Amer: >60 mL/min (ref >60)
GLUCOSE: 258 mg/dL — AB (ref 65–99)
POTASSIUM: 4 mmol/L (ref 3.5–5.1)
Sodium: 136 mmol/L (ref 135–145)

## 2017-06-24 NOTE — Progress Notes (Signed)
   CC: DM follow up   HPI:  [name] is a 41 y.o. male with PMH as detailed below who presents to clinic for T2DM follow up. Please see problem based assessment and plan for further details.   Past Medical History:  Diagnosis Date  . Depression, major, single episode 01/14/2017  . Diabetes mellitus (HCC)   . ED (erectile dysfunction)   . History of syncope 01/29/2015  . Hypertension   . 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 Asymptomatic.   Marland Kitchen Nonischemic cardiomyopathy (HCC)    a.  echo 4/06: EF 30%, mild to mod MR, mild RAE, inf HK, lat HK , ant HK;    b.  cath 4/06: no CAD, EF 20-25%  . NSVT (nonsustained ventricular tachycardia) (HCC)   . Obesity   . Systolic CHF, chronic (HCC)    EF 11/17 25-30%, s/p ICD    Review of Systems:   Review of Systems  Constitutional: Negative for chills and fever.  Eyes: Negative for blurred vision.  Respiratory: Negative for cough and shortness of breath.   Cardiovascular: Positive for leg swelling. Negative for chest pain and palpitations.  Gastrointestinal: Negative for abdominal pain, constipation, diarrhea, nausea and vomiting.  Genitourinary: Negative for frequency and urgency.  Neurological: Negative for dizziness and headaches.    Physical Exam:  Vitals:   06/24/17 1332  BP: 118/63  Pulse: 69  Temp: 98 F (36.7 C)  TempSrc: Oral  SpO2: 100%  Weight: (!) 336 lb 14.4 oz (152.8 kg)  Height: 6\' 4"  (1.93 m)   General: pleasant male, obese, well-developed, in NAD  HENT: NCAT, neck supple and FROM, MMM, OP clear without exudates or erythema Cardiac: regular rate and rhythm, nl S1/S2, no murmurs, rubs or gallops  Pulm: CTAB, no wheezes or crackles, no increased work of breathing  Abd: soft, NTND, normoactive bowel sounds Ext: warm and well perfused, 1+ peripheral edema bilaterally    Assessment & Plan:   See Encounters Tab for problem based charting.  Patient seen with Dr.  

## 2017-06-24 NOTE — Progress Notes (Signed)
Internal Medicine Clinic Attending  I saw and evaluated the patient.  I personally confirmed the key portions of the history and exam documented by Dr. Santos-Sanchez and I reviewed pertinent patient test results.  The assessment, diagnosis, and plan were formulated together and I agree with the documentation in the resident's note. 

## 2017-06-24 NOTE — Assessment & Plan Note (Signed)
Patient presenting for DM follow up. Has been taking Lantus 20u QHS. BG between 200-300s with lowest at 181 and highest at 414. Reports compliance with Lantus and metformin. Reports no symptoms of hypoglycemia. Denies polydipsia.  - Increase Lantus 20u --> 25U QHS - Increase metformin to 1000mg  in AM and 500mg  at bedtime  - Consider adding Victoza to regimen since this will also help with weight loss  - Referral for nutrition consult  - F/u in 2 weeks in [facility]. Patient instructed to bring meter and log.

## 2017-06-24 NOTE — Patient Instructions (Signed)
It was a pleasure to meet you, Douglas [name].   We increased your Lantus to 25 units. Please START using 25 units of Lantus before bedtime.   We also increased your metformin. Please START taking 2 tablets in the morning and 1 tablet at night or vice versa.   Please follow up with [place] in 2 weeks and bring your meter and blood sugar log.

## 2017-07-04 ENCOUNTER — Ambulatory Visit (INDEPENDENT_AMBULATORY_CARE_PROVIDER_SITE_OTHER): Payer: Medicaid Other

## 2017-07-04 DIAGNOSIS — I5042 Chronic combined systolic (congestive) and diastolic (congestive) heart failure: Secondary | ICD-10-CM

## 2017-07-04 DIAGNOSIS — Z95810 Presence of automatic (implantable) cardiac defibrillator: Secondary | ICD-10-CM | POA: Diagnosis not present

## 2017-07-04 DIAGNOSIS — Z4502 Encounter for adjustment and management of automatic implantable cardiac defibrillator: Secondary | ICD-10-CM

## 2017-07-07 ENCOUNTER — Other Ambulatory Visit (HOSPITAL_COMMUNITY): Payer: Self-pay | Admitting: Cardiology

## 2017-07-07 MED FILL — TORSEMIDE 20 MG TABLET: 20 | 30 days supply | Qty: 210 | Fill #1

## 2017-07-07 MED FILL — SPIRONOLACTONE 25 MG TABLET: 25 | 90 days supply | Qty: 90 | Fill #0

## 2017-07-07 MED FILL — buPROPion HCL ER (XL) 150 M: 150 | 30 days supply | Qty: 30 | Fill #0

## 2017-07-07 MED FILL — POTASSIUM CL ER 20 MEQ TAB: 20 | 30 days supply | Qty: 60 | Fill #1

## 2017-07-07 MED FILL — ENTRESTO 49 MG-51 MG TABLET: 49-51 | 30 days supply | Qty: 60 | Fill #2

## 2017-07-07 MED FILL — METFORMIN HCL ER 500 MG TAB: 500 | 90 days supply | Qty: 180 | Fill #0

## 2017-07-07 MED FILL — ATORVASTATIN 40 MG TABLET: 40 | 30 days supply | Qty: 30 | Fill #2

## 2017-07-07 MED FILL — CARVEDILOL 12.5 MG TABS: 12.5 | 30 days supply | Qty: 60 | Fill #1

## 2017-07-07 NOTE — Progress Notes (Signed)
EPIC Encounter for ICM Monitoring  Patient Name: NAYAN PROCH is a 41 y.o. male Date: 07/07/2017 Primary Care Physican: Alphonzo Grieve, MD Primary Cardiologist:Bensimhon Electrophysiologist: Faustino Congress Weight:does not weigh        Heart Failure questions reviewed, pt has one ankle with a little swelling but denied any shortness of breath.    Thoracic impedance abnormal suggesting fluid accumulation since 06/27/2017.  Prescribed dosage: Torsemide 20 mg 4 tablets (80 mg total) every AM and 3 tablets (60 mg total) every PM. Potassium 20 mEq 2 tablets (40 mEq total) daily.   Labs: 06/24/2017 Creatinine 1.29, BUN 17, Potassium 4.0, Sodium 136, EGFR >60 06/08/2017 Creatinine 1.59, BUN 21, Potassium 3.6, Sodium 134, EGFR >60 05/09/2017 Creatinine 1.59, BUN 24, Potassium 3.6, Sodium 131, EGFR 52->60 05/02/2017 Creatinine 1.35, BUN 14, Potassium 3.5, Sodium 136, EGFR >60 01/05/2017 Creatinine 1.46, BUN 18, Potassium 4.1, Sodium 135, EGFR 58->60 10/04/2016 Creatinine 1.42, BUN 11, Potassium 3.3, Sodium 138, EGFR >60 09/23/2016 Creatinine 1.28, BUN 16, Potassium 3.7, Sodium 136, EGFR >60  06/01/2016 Creatinine 1.34, BUN 6, Potassium 3.4, Sodium 139, EGFR >60 01/09/2016 Creatinine 1.26, BUN 15, Potassium 3.3, Sodium 138, EGFR 70-81 05/07/2015 Creatinine 1.30, BUN 18, Potassium 3.5, Sodium 139  Recommendations:  Patient ran Willey Due on Torsemide and has only been taking 40 mg in AM and 40 mg in PM for at least the last week.  He will pick up new script tomorrow.  Advised if he is running out of Torsemide to call the office for refills.  Advised to return to prescribed dosage of Torsemide and to limit salt intake to 2000 mg/day.  Encouraged to call for fluid symptoms or use ER for any urgent symptoms.  Follow-up plan: ICM clinic phone appointment on 07/14/2017 (manual send) to recheck fluid levels.    Copy of ICM check sent to Dr. Phillip Heal and Dr. Caryl Comes.   3 month ICM trend:  07/06/2017   1 Year ICM trend:      Rosalene Billings, RN 07/07/2017 4:54 PM

## 2017-07-08 ENCOUNTER — Ambulatory Visit (INDEPENDENT_AMBULATORY_CARE_PROVIDER_SITE_OTHER): Payer: Medicaid Other | Admitting: Dietician

## 2017-07-08 ENCOUNTER — Ambulatory Visit (INDEPENDENT_AMBULATORY_CARE_PROVIDER_SITE_OTHER): Payer: Medicaid Other | Admitting: Internal Medicine

## 2017-07-08 ENCOUNTER — Encounter: Payer: Self-pay | Admitting: Internal Medicine

## 2017-07-08 VITALS — BP 123/76 | HR 70 | Temp 98.0°F | Wt 340.4 lb

## 2017-07-08 DIAGNOSIS — N182 Chronic kidney disease, stage 2 (mild): Principal | ICD-10-CM

## 2017-07-08 DIAGNOSIS — Z794 Long term (current) use of insulin: Secondary | ICD-10-CM | POA: Diagnosis not present

## 2017-07-08 DIAGNOSIS — E1122 Type 2 diabetes mellitus with diabetic chronic kidney disease: Secondary | ICD-10-CM

## 2017-07-08 DIAGNOSIS — E119 Type 2 diabetes mellitus without complications: Secondary | ICD-10-CM | POA: Diagnosis not present

## 2017-07-08 DIAGNOSIS — R600 Localized edema: Secondary | ICD-10-CM

## 2017-07-08 DIAGNOSIS — Z713 Dietary counseling and surveillance: Secondary | ICD-10-CM | POA: Diagnosis not present

## 2017-07-08 MED ORDER — EXENATIDE ER 2 MG ~~LOC~~ PEN: 2.0000 mg | PEN_INJECTOR | SUBCUTANEOUS | 3 refills | Status: DC

## 2017-07-08 MED FILL — BIDIL TABLET: 20-37.5 | 30 days supply | Qty: 90 | Fill #0

## 2017-07-08 MED FILL — BYDUREON 2 MG PEN INJECT: 2 | 28 days supply | Qty: 4 | Fill #0

## 2017-07-08 NOTE — Assessment & Plan Note (Addendum)
Patient presenting for diabetes follow-up. During his last visit on 8/10 his Lantus was increased from 20 units to 25 QHS. He is also on metformin XR 1000 in AM and 500 QHS. Reports compliance with medications. Last A1c 13.7 on 05/2017. Glucometer: Average BG 250, lowest BG 134, and highest BG 325. No low BG noted. He reports no symptoms of hypoglycemia. Patient states he continues to struggle with low carbohydrate diet. Reports being so busy at times that he eats what is available to him at the moment. He also continues to drink sodas and juice and eat white rice.    Patient will meet with Donna today for nutrition counseling. 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 Will also add Bydureon to his regimen for better glycemic control. Initially planned to start Victoza but not covered by Medicaid unless patient fails other agents in the same class. Also offered patient Byetta, but declined due to BID injections.   Uncontrolled, T2DM:  - Start Bydureon 2mg once weekly  - Patient met with Donna today for further nutrition counseling  - Follow up in 4 weeks  

## 2017-07-08 NOTE — Progress Notes (Signed)
Diabetes Self-Management Education  Visit Type: First/Initial  Appt. Start Time: 1420 Appt. End Time: 1520  07/08/2017  [name], identified by name and date of birth, is a 41 y.o. male with a diagnosis of Diabetes: Type 2.   ASSESSMENT  [name] reports good basic knowledge of meal planning for diabetes. 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 He does not want to attend group classes for his diabetes at this point, but agrees to visits on same day as doctor die to transportation limitations. He is currently working intermittently.       Diabetes Self-Management Education - 07/08/17 1700      Visit Information   Visit Type First/Initial     Initial Visit   Diabetes Type Type 2   Are you currently following a meal plan? Yes   What type of meal plan do you follow? smaller portion, less carbs   Are you taking your medications as prescribed? Yes   Date Diagnosed 2016     Psychosocial Assessment   Patient Belief/Attitude about Diabetes Motivated to manage diabetes   Self-care barriers Lack of transportation;Lack of material resources   Self-management support Doctor's office;CDE visits   Patient Concerns Nutrition/Meal planning;Medication   Special Needs None   Preferred Learning Style No preference indicated     Pre-Education Assessment   Patient understands the diabetes disease and treatment process. Needs Instruction   Patient understands incorporating nutritional management into lifestyle. Needs Instruction   Patient undertands incorporating physical activity into lifestyle. Demonstrates understanding / competency   Patient understands using medications safely. Needs Instruction   Patient understands monitoring blood glucose, interpreting and using results Needs Instruction   Patient understands prevention, detection, and treatment of acute complications. Needs Instruction   Patient understands prevention, detection, and treatment of chronic  complications. Needs Instruction   Patient understands how to develop strategies to address psychosocial issues. Demonstrates understanding / competency   Patient understands how to develop strategies to promote health/change behavior. Demonstrates understanding / competency     Complications   How often do you check your blood sugar? 3-4 times/day   Fasting Blood glucose range (mg/dL) 660-630   Postprandial Blood glucose range (mg/dL) 160-109;>323   Number of hypoglycemic episodes per month 0   Number of hyperglycemic episodes per week 4     Patient Education   Previous Diabetes Education No   Disease state  Definition of diabetes, type 1 and 2, and the diagnosis of diabetes   Nutrition management  Meal timing in regards to the patients' current diabetes medication.;Meal options for control of blood glucose level and chronic complications.   Medications Reviewed patients medication for diabetes, action, purpose, timing of dose and side effects.  taught patient how to use Bydureon pen and action of GLP-1s   Monitoring Identified appropriate SMBG and/or A1C goals.     Outcomes   Expected Outcomes Demonstrated interest in learning. Expect positive outcomes   Future DMSE 2 months   Program Status Not Completed      Individualized Plan for Diabetes Self-Management Training:   Learning Objective:  Patient will have a greater understanding of diabetes self-management. Patient education plan is to attend individual and/or group sessions per assessed needs and concerns.   Plan:   Patient Instructions  Today we talked about:    Blood sugar goals for most: 70=130 before meals  70-180 after meals (1-3 hours after)   Bydureon helps lower blood sugar and decrease your appetite, so it may help with weight loss.    Diabetes meal planning- healthy plate at each meal - half veggies, 1/4 protein and 1/4                                                                                              starch, add fruit for dessert and low                                                                                       sugar beverage  Snacks are okay as long as they are well balanced and healthy most of the time, too!   I made a follow up with me as we discussed on the same day you see your doctor in September.   You said the most important change you could make right now to take the best care of your diabetes is to get more sleep!    That sound like a goal!  Here are some ideas to help:   Go to bed and get up at the same time each day - Your body has a natural clock, which will make you sleepy when you're ready for bed. Try not to ignore this. - Going to bed too early may cause you to have a disturbed sleep. - Getting up at the same time helps to keep your body clock synchronised with what's going on outside. - If you can stick to a fairly regular waking and sleeping time, your body will get used to it. - Avoid the temptation to try to make up for a poor night's sleep by sleeping in. However, this doesn't mean that you should be obsessive about it, an occasional night out or sleep-in is notgoing to hurt.  Be physically active each day and try to spend time outdoors or in natural light - Regular physical activity improves restful sleep. But don't be physically active just before going to bed. Physical activity immediately before bed stimulates your body and may make it difficult to fall asleep. Do your physical activity earlier in the day, preferably before dinner time. - Light is important for your body to produce melatonin, a hormone that helps make you feel sleepy. Sunlight early in the day is particularly helpful in synchronising your body clock.    Make your bedroom as restful as possible - Keep the temperature cool, keep noises and outside light to a minimum, and leave distracting  things such as beeping watches or clocks  outside. - Use your bed only for sleep and sex. Some people use their bed for knitting, studying, watching TV, making phone calls, and so on. You need to try to avoid this and make  sure that  your bed is associated with sleeping. The brain makes connections between places (the bedroom) and events (sleeping) and you need to reinforce these. - Don't share your bed with children or pets. Research has shown that parents sleeping with  young children sleep less and have more disturbed sleep. - Don't look at the clock all the time. Clocks with bright numbers are a distraction, and obsessing over time will just make it more difficult to sleep.  Take care about what you do just before bed - Don't engage in stimulating activity just before bed. Playing a competitive game, watching an  exciting programme or a movie on TV, or having an important family discussion stimulates your mind, and thoughts will overflow to the bedroom. Worrying about or planning the next  day's activity may be a natural thing to do, but try to avoid it.  - Avoid using screens. Brightly lit screens reduce your body's ability to produce melatonin.  While you might think you are using your screen to relax until you can sleep, your device or TV is actually keeping you awake.    - Don't use alcohol to help you sleep. Alcohol may help you get to sleep but it has some bad effects. It causes you to need more trips to the toilet, it causes you to wake up early, it causes fragmented sleep and it worsens snoring and sleep apnoea.   - Don't drink caffeine-containing drinks in the evening. Coffee and tea are the obvious caffeine-containing drinks but colas and many other soft drinks also contain caffeine. Read the labels. A glass of warm milk is an old-fashioned recipe that may work well. If frequent trips to the toilet are a problem during the night, try not to drink too much before bedtime  and make sure you go to bed with an empty bladder.    -  Don't go to bed too hungry or too full. If you are usually have a light supper, you should keep doing this but don't eat too much.  - Don't have a nap in the evening before you go to bed. If you usually have a daytime nap and it doesn't affect your night-time sleep, you can keep doing this. But try not to sleep in the evening as that is going to reduce your body's need to fall asleep.  Take medications as directed - Prescription medications may cause you to be alert or sleepy. You should follow the instructions that come with them. - Don't vary the time of day that you take your medication. - Don't rely on sleeping tablets to help you to sleep. Sleeping tablets have a role when something is temporarily causing you to have trouble getting to sleep but they are only a temporary fix. Some tablets may cause you to be a bit sleepy in the daytime and when you  stop taking them you may get a rebound and find it harder to fall asleep.        Expected Outcomes:  Demonstrated interest in learning. Expect positive outcomes  Education material provided: Living Well with Diabetes  If problems or questions, patient to contact team via:  Phone  Future DSME appointment:    1 month Plyler, Lupita Leash, RD 07/08/2017 5:45 PM.

## 2017-07-08 NOTE — Patient Instructions (Addendum)
It was nice to see you again, Douglas [name].   We started a new medication for your diabetes called Bydureon. You will inject 2mg  once every week.   Continue taking Lantus 25 units at night and metformin 1000mg  in the morning and 500mg  at night.   Please follow up in 4 weeks.   Please call the Internal Medicine clinic if you have any questions.

## 2017-07-08 NOTE — Progress Notes (Signed)
   CC: Diabetes follow-up  HPI:  Mr.Burnette L Cortez is a 41 y.o. male with PMH listed below who presents to clinic for diabetes follow-up after adjustment in insulin dose 2 weeks ago. He see problem based assessment and plan for further details.  Past Medical History:  Diagnosis Date  . Depression, major, single episode 01/14/2017  . Diabetes mellitus (HCC)   . ED (erectile dysfunction)   . History of syncope 01/29/2015  . Hypertension   . 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 Asymptomatic.   Marland Kitchen Nonischemic cardiomyopathy (HCC)    a.  echo 4/06: EF 30%, mild to mod MR, mild RAE, inf HK, lat HK , ant HK;    b.  cath 4/06: no CAD, EF 20-25%  . NSVT (nonsustained ventricular tachycardia) (HCC)   . Obesity   . Systolic CHF, chronic (HCC)    EF 11/17 25-30%, s/p ICD   Review of Systems:   Review of Systems  Constitutional: Negative for chills and fever.  Respiratory: Negative for cough and shortness of breath.   Cardiovascular: Positive for leg swelling. Negative for chest pain and palpitations.  Gastrointestinal: Negative for abdominal pain, nausea and vomiting.  Genitourinary: Negative for dysuria, frequency and urgency.    Physical Exam:  Vitals:   07/08/17 1332  BP: 123/76  Pulse: 70  Temp: 98 F (36.7 C)  TempSrc: Oral  SpO2: 100%  Weight: (!) 340 lb 6.4 oz (154.4 kg)   General: Pleasant male, well-developed, well-nourished, in no acute distress HENT: NCAT, neck supple and FROM, MMM, OP clear without exudates or erythema  Cardiac: regular rate and rhythm, nl S1/S2, no murmurs, rubs or gallops  Pulm: CTAB, no wheezes or crackles, no increased work of breathing  Ext: warm and well perfused, mild peripheral edema bilaterally     Assessment & Plan:   See Encounters Tab for problem based charting.  Patient seen with Dr. Cleda Daub

## 2017-07-08 NOTE — Patient Instructions (Signed)
Today we talked about:    Blood sugar goals for most: 70=130 before meals                                               70-180 after meals (1-3 hours after)   Bydureon helps lower blood sugar and decrease your appetite, so it may help with weight loss.    Diabetes meal planning- healthy plate at each meal - half veggies, 1/4 protein and 1/4                                                                                             starch, add fruit for dessert and low                                                                                       sugar beverage  Snacks are okay as long as they are well balanced and healthy most of the time, too!   I made a follow up with me as we discussed on the same day you see your doctor in September.   You said the most important change you could make right now to take the best care of your diabetes is to get more sleep!    That sound like a goal!  Here are some ideas to help:   Go to bed and get up at the same time each day - Your body has a natural clock, which will make you sleepy when you're ready for bed. Try not to ignore this. - Going to bed too early may cause you to have a disturbed sleep. - Getting up at the same time helps to keep your body clock synchronised with what's going on outside. - If you can stick to a fairly regular waking and sleeping time, your body will get used to it. - Avoid the temptation to try to make up for a poor night's sleep by sleeping in. However, this doesn't mean that you should be obsessive about it, an occasional night out or sleep-in is notgoing to hurt.  Be physically active each day and try to spend time outdoors or in natural light - Regular physical activity improves restful sleep. But don't be physically active just before going to bed. Physical activity immediately before bed stimulates your body and may make it difficult to fall asleep. Do your physical activity earlier in the day,  preferably before dinner time. - Light is important for your body to produce melatonin, a hormone that helps make you feel sleepy. Sunlight early in the day is particularly helpful in synchronising your body clock.    Make your bedroom as restful as  possible - Keep the temperature cool, keep noises and outside light to a minimum, and leave distracting  things such as beeping watches or clocks outside. - Use your bed only for sleep and sex. Some people use their bed for knitting, studying, watching TV, making phone calls, and so on. You need to try to avoid this and make sure that  your bed is associated with sleeping. The brain makes connections between places (the bedroom) and events (sleeping) and you need to reinforce these. - Don't share your bed with children or pets. Research has shown that parents sleeping with  young children sleep less and have more disturbed sleep. - Don't look at the clock all the time. Clocks with bright numbers are a distraction, and obsessing over time will just make it more difficult to sleep.  Take care about what you do just before bed - Don't engage in stimulating activity just before bed. Playing a competitive game, watching an  exciting programme or a movie on TV, or having an important family discussion stimulates your mind, and thoughts will overflow to the bedroom. Worrying about or planning the next  day's activity may be a natural thing to do, but try to avoid it.  - Avoid using screens. Brightly lit screens reduce your body's ability to produce melatonin.  While you might think you are using your screen to relax until you can sleep, your device or TV is actually keeping you awake.    - Don't use alcohol to help you sleep. Alcohol may help you get to sleep but it has some bad effects. It causes you to need more trips to the toilet, it causes you to wake up early, it causes fragmented sleep and it worsens snoring and sleep apnoea.   - Don't drink  caffeine-containing drinks in the evening. Coffee and tea are the obvious caffeine-containing drinks but colas and many other soft drinks also contain caffeine. Read the labels. A glass of warm milk is an old-fashioned recipe that may work well. If frequent trips to the toilet are a problem during the night, try not to drink too much before bedtime  and make sure you go to bed with an empty bladder.    - Don't go to bed too hungry or too full. If you are usually have a light supper, you should keep doing this but don't eat too much.  - Don't have a nap in the evening before you go to bed. If you usually have a daytime nap and it doesn't affect your night-time sleep, you can keep doing this. But try not to sleep in the evening as that is going to reduce your body's need to fall asleep.  Take medications as directed - Prescription medications may cause you to be alert or sleepy. You should follow the instructions that come with them. - Don't vary the time of day that you take your medication. - Don't rely on sleeping tablets to help you to sleep. Sleeping tablets have a role when something is temporarily causing you to have trouble getting to sleep but they are only a temporary fix. Some tablets may cause you to be a bit sleepy in the daytime and when you  stop taking them you may get a rebound and find it harder to fall asleep.

## 2017-07-10 NOTE — Progress Notes (Signed)
Internal Medicine Clinic Attending  I saw and evaluated the patient.  I personally confirmed the key portions of the history and exam documented by Dr. Santos-Sanchez and I reviewed pertinent patient test results.  The assessment, diagnosis, and plan were formulated together and I agree with the documentation in the resident's note. 

## 2017-07-12 ENCOUNTER — Telehealth: Payer: Self-pay | Admitting: Pharmacist

## 2017-07-12 NOTE — Progress Notes (Signed)
Patient called and left message asking for help with getting medications through [facility] pharmacy. Tried calling patient back but had to leave a message. Spoke to [facility] pharmacy to notify them patient may be under HF program, I have no other insight at this time but happy to help if needed.

## 2017-07-14 ENCOUNTER — Ambulatory Visit (INDEPENDENT_AMBULATORY_CARE_PROVIDER_SITE_OTHER): Payer: Medicaid Other

## 2017-07-14 DIAGNOSIS — Z4502 Encounter for adjustment and management of automatic implantable cardiac defibrillator: Secondary | ICD-10-CM

## 2017-07-14 DIAGNOSIS — I5042 Chronic combined systolic (congestive) and diastolic (congestive) heart failure: Secondary | ICD-10-CM

## 2017-07-14 NOTE — Progress Notes (Signed)
EPIC Encounter for ICM Monitoring  Patient Name: Douglas [name] is a 41 y.o. male Date: 07/14/2017 Primary Care Physican: Alphonzo Grieve, MD Primary Cardiologist:Bensimhon Electrophysiologist: Faustino Congress Weight:does not weigh      Attempted call to patient and unable to reach.  Left detailed message regarding transmission.  Transmission reviewed.    Thoracic impedance returned to normal after patient resuming prescribed Torsemide  Prescribed dosage: Torsemide 20 mg 4 tablets (80 mg total) every AM and 3 tablets (60 mg total) every PM. Potassium 20 mEq 2 tablets (40 mEq total) daily.   Labs: 06/24/2017 Creatinine 1.29, BUN 17, Potassium 4.0, Sodium 136, EGFR >60 06/08/2017 Creatinine 1.59, BUN 21, Potassium 3.6, Sodium 134, EGFR >60 05/09/2017 Creatinine 1.59, BUN 24, Potassium 3.6, Sodium 131, EGFR 52->60 05/02/2017 Creatinine 1.35, BUN 14, Potassium 3.5, Sodium 136, EGFR >60 01/05/2017 Creatinine 1.46, BUN 18, Potassium 4.1, Sodium 135, EGFR 58->60 10/04/2016 Creatinine 1.42, BUN 11, Potassium 3.3, Sodium 138, EGFR >60 09/23/2016 Creatinine 1.28, BUN 16, Potassium 3.7, Sodium 136, EGFR >60  06/01/2016 Creatinine 1.34, BUN 6, Potassium 3.4, Sodium 139, EGFR >60 01/09/2016 Creatinine 1.26, BUN 15, Potassium 3.3, Sodium 138, EGFR 70-81 05/07/2015 Creatinine 1.30, BUN 18, Potassium 3.5, Sodium 139  Recommendations: Left voice mail with ICM number and encouraged to call for fluid symptoms.  Follow-up plan: ICM clinic phone appointment on 08/04/2017.    Copy of ICM check sent to Dr. Caryl Comes.   3 month ICM trend: 07/14/2017   1 Year ICM trend:      Rosalene Billings, RN 07/14/2017 5:13 PM

## 2017-07-20 MED FILL — ACCU-CHEK SMARTVIEW STRIP: 33 days supply | Qty: 100 | Fill #1

## 2017-07-20 MED FILL — LANTUS SOLOSTAR 100 UNITS/M: 100 | 30 days supply | Qty: 6 | Fill #1

## 2017-08-04 ENCOUNTER — Telehealth: Payer: Self-pay

## 2017-08-04 ENCOUNTER — Ambulatory Visit (INDEPENDENT_AMBULATORY_CARE_PROVIDER_SITE_OTHER): Payer: Medicaid Other | Admitting: *Deleted

## 2017-08-04 DIAGNOSIS — I428 Other cardiomyopathies: Secondary | ICD-10-CM

## 2017-08-04 DIAGNOSIS — I5042 Chronic combined systolic (congestive) and diastolic (congestive) heart failure: Secondary | ICD-10-CM | POA: Diagnosis not present

## 2017-08-04 DIAGNOSIS — Z95810 Presence of automatic (implantable) cardiac defibrillator: Secondary | ICD-10-CM | POA: Diagnosis not present

## 2017-08-04 DIAGNOSIS — Z4502 Encounter for adjustment and management of automatic implantable cardiac defibrillator: Secondary | ICD-10-CM

## 2017-08-04 NOTE — Progress Notes (Signed)
EPIC Encounter for ICM Monitoring  Patient Name: Douglas [name] is a 41 y.o. male Date: 08/04/2017 Primary Care Physican: Alphonzo Grieve, MD Primary Cardiologist:Bensimhon Electrophysiologist: Faustino Congress Weight:does not weigh      Attempted call to patient and unable to reach.  Left detailed message regarding transmission.  Transmission reviewed.    Thoracic impedance normal but was abnormal suggesting fluid accumulation from 07/12/2017 - 07/26/2017.  Prescribed dosage: Torsemide 20 mg 4 tablets (80 mg total) every AM and 3 tablets (60 mg total) every PM. Potassium 20 mEq 2 tablets (40 mEq total) daily.   Labs: 06/24/2017 Creatinine 1.29, BUN 17, Potassium 4.0, Sodium 136, EGFR >60 06/08/2017 Creatinine 1.59, BUN 21, Potassium 3.6, Sodium 134, EGFR >60 05/09/2017 Creatinine 1.59, BUN 24, Potassium 3.6, Sodium 131, EGFR 52->60 05/02/2017 Creatinine 1.35, BUN 14, Potassium 3.5, Sodium 136, EGFR >60 01/05/2017 Creatinine 1.46, BUN 18, Potassium 4.1, Sodium 135, EGFR 58->60 10/04/2016 Creatinine 1.42, BUN 11, Potassium 3.3, Sodium 138, EGFR >60 09/23/2016 Creatinine 1.28, BUN 16, Potassium 3.7, Sodium 136, EGFR >60  06/01/2016 Creatinine 1.34, BUN 6, Potassium 3.4, Sodium 139, EGFR >60 01/09/2016 Creatinine 1.26, BUN 15, Potassium 3.3, Sodium 138, EGFR 70-81 05/07/2015 Creatinine 1.30, BUN 18, Potassium 3.5, Sodium 139  Recommendations: Left voice mail with ICM number and encouraged to call for fluid symptoms.  Follow-up plan: ICM clinic phone appointment on 09/05/2017.  Office appointment scheduled 08/19/2017 with Dr. Haroldine Laws.  Copy of ICM check sent to Dr. Caryl Comes.   3 month ICM trend: 08/04/2017   1 Year ICM trend:      Rosalene Billings, RN 08/04/2017 11:34 AM

## 2017-08-04 NOTE — Telephone Encounter (Signed)
Remote ICM transmission received.  Attempted call to patient and left detailed message regarding transmission and next ICM scheduled for 09/05/2017.  Advised to return call for any fluid symptoms or questions.   

## 2017-08-04 NOTE — Progress Notes (Signed)
Remote ICD transmission.   

## 2017-08-05 ENCOUNTER — Encounter: Payer: Self-pay | Admitting: Cardiology

## 2017-08-08 LAB — CUP PACEART REMOTE DEVICE CHECK
Battery Remaining Longevity: 100 mo
Battery Voltage: 3 V
HighPow Impedance: 48 Ohm
HighPow Impedance: 60 Ohm
Implantable Lead Location: 753860
Implantable Pulse Generator Implant Date: 20141009
Lead Channel Impedance Value: 399 Ohm
Lead Channel Pacing Threshold Amplitude: 0.625 V
Lead Channel Pacing Threshold Pulse Width: 0.4 ms
Lead Channel Setting Pacing Amplitude: 2.5 V
Lead Channel Setting Sensing Sensitivity: 0.45 mV
MDC IDC LEAD IMPLANT DT: 20141009
MDC IDC MSMT LEADCHNL RV IMPEDANCE VALUE: 285 Ohm
MDC IDC MSMT LEADCHNL RV SENSING INTR AMPL: 11 mV
MDC IDC MSMT LEADCHNL RV SENSING INTR AMPL: 11 mV
MDC IDC SESS DTM: 20180920141128
MDC IDC SET LEADCHNL RV PACING PULSEWIDTH: 0.4 ms
MDC IDC STAT BRADY RV PERCENT PACED: 0.01 %

## 2017-08-10 ENCOUNTER — Ambulatory Visit (HOSPITAL_COMMUNITY)
Admission: RE | Admit: 2017-08-10 | Discharge: 2017-08-10 | Disposition: A | Discharge status: Home / Self Care | Payer: Medicaid Other | Source: Ambulatory Visit | Attending: Internal Medicine | Admitting: Internal Medicine

## 2017-08-10 ENCOUNTER — Ambulatory Visit (INDEPENDENT_AMBULATORY_CARE_PROVIDER_SITE_OTHER): Payer: Medicaid Other | Admitting: Dietician

## 2017-08-10 ENCOUNTER — Encounter: Payer: Self-pay | Admitting: Internal Medicine

## 2017-08-10 ENCOUNTER — Ambulatory Visit (INDEPENDENT_AMBULATORY_CARE_PROVIDER_SITE_OTHER): Payer: Medicaid Other | Admitting: Internal Medicine

## 2017-08-10 VITALS — BP 129/69 | HR 81 | Temp 98.1°F | Ht 76.0 in | Wt 340.0 lb

## 2017-08-10 DIAGNOSIS — Z79899 Other long term (current) drug therapy: Secondary | ICD-10-CM

## 2017-08-10 DIAGNOSIS — I428 Other cardiomyopathies: Secondary | ICD-10-CM | POA: Diagnosis not present

## 2017-08-10 DIAGNOSIS — I440 Atrioventricular block, first degree: Secondary | ICD-10-CM

## 2017-08-10 DIAGNOSIS — E119 Type 2 diabetes mellitus without complications: Secondary | ICD-10-CM | POA: Diagnosis not present

## 2017-08-10 DIAGNOSIS — Z713 Dietary counseling and surveillance: Secondary | ICD-10-CM | POA: Diagnosis not present

## 2017-08-10 DIAGNOSIS — I5042 Chronic combined systolic (congestive) and diastolic (congestive) heart failure: Secondary | ICD-10-CM | POA: Insufficient documentation

## 2017-08-10 DIAGNOSIS — I130 Hypertensive heart and chronic kidney disease with heart failure and stage 1 through stage 4 chronic kidney disease, or unspecified chronic kidney disease: Secondary | ICD-10-CM | POA: Diagnosis not present

## 2017-08-10 DIAGNOSIS — N182 Chronic kidney disease, stage 2 (mild): Secondary | ICD-10-CM | POA: Diagnosis not present

## 2017-08-10 DIAGNOSIS — F1490 Cocaine use, unspecified, uncomplicated: Secondary | ICD-10-CM

## 2017-08-10 DIAGNOSIS — R0789 Other chest pain: Secondary | ICD-10-CM

## 2017-08-10 DIAGNOSIS — F339 Major depressive disorder, recurrent, unspecified: Secondary | ICD-10-CM | POA: Diagnosis not present

## 2017-08-10 DIAGNOSIS — E1122 Type 2 diabetes mellitus with diabetic chronic kidney disease: Secondary | ICD-10-CM

## 2017-08-10 DIAGNOSIS — T501X6A Underdosing of loop [high-ceiling] diuretics, initial encounter: Secondary | ICD-10-CM | POA: Diagnosis not present

## 2017-08-10 DIAGNOSIS — Z794 Long term (current) use of insulin: Secondary | ICD-10-CM | POA: Diagnosis not present

## 2017-08-10 DIAGNOSIS — Z95810 Presence of automatic (implantable) cardiac defibrillator: Secondary | ICD-10-CM | POA: Diagnosis not present

## 2017-08-10 DIAGNOSIS — R21 Rash and other nonspecific skin eruption: Secondary | ICD-10-CM

## 2017-08-10 DIAGNOSIS — Z720 Tobacco use: Secondary | ICD-10-CM

## 2017-08-10 DIAGNOSIS — Z23 Encounter for immunization: Secondary | ICD-10-CM | POA: Diagnosis not present

## 2017-08-10 DIAGNOSIS — F17210 Nicotine dependence, cigarettes, uncomplicated: Secondary | ICD-10-CM

## 2017-08-10 DIAGNOSIS — Z91120 Patient's intentional underdosing of medication regimen due to financial hardship: Secondary | ICD-10-CM | POA: Diagnosis not present

## 2017-08-10 DIAGNOSIS — I10 Essential (primary) hypertension: Secondary | ICD-10-CM

## 2017-08-10 DIAGNOSIS — F321 Major depressive disorder, single episode, moderate: Secondary | ICD-10-CM

## 2017-08-10 MED ORDER — BUPROPION HCL ER (XL) 300 MG PO TB24: 300.0000 mg | ORAL_TABLET | Freq: Every day | ORAL | 1 refills | Status: DC

## 2017-08-10 MED ORDER — TORSEMIDE 20 MG PO TABS: ORAL_TABLET | ORAL | 3 refills | Status: DC

## 2017-08-10 MED FILL — BUPROPION HCL XL 300 MG TAB: 300 | 90 days supply | Qty: 90 | Fill #0 | Status: TO

## 2017-08-10 MED FILL — TORSEMIDE 20 MG TABLET: 20 | 30 days supply | Qty: 210 | Fill #0 | Status: TO

## 2017-08-10 NOTE — Patient Instructions (Addendum)
I have sent your Wellbutrin and torsemide to the [place] outpatient pharmacy for no cost today. The wellbutrin will be increase dose to 300mg  daily (one pill only).  Continue your lantus 25 units at night and your metformin 1000mg  in the morning and 500mg  in the afternoon.

## 2017-08-10 NOTE — Assessment & Plan Note (Signed)
Patient with better control of his diabetes per glucometer since starting bydureon, however patient appears to have had allergic reaction (rash) associated with its use; he is not interested in changing to another agent at this time.  Plan: --continue metformin ER 1000mg  qAM, 500mg  qPM --continue Lantus 25 units qhs --he will meet with our diabetes educator today to continue working on diet changes as well as continued education about diabetes and management --f/u in 4-6 weeks for A1c and glucometer overview

## 2017-08-10 NOTE — Patient Instructions (Signed)
1. Call to make an eye doctor appointment. Please ask him to fax the report to our office at [phone]  Francene Boyers, MD [facility]  Phone: [phone];        2.   Great job checking your blood sugars. You might consider tlkaing to Dr. Samuella Cota about checking your blood sugar 2-3 times a day for 3-4 days each week.   3. We can discuss low blood sugar, high blood sugar and preventing, detecting and caring for complications of diabetes at your next visit.  ( Feet, eyes, teeth, heart, kidneys)  Please make an appointment with me on the same day you see the doctor.  Call anytime I can be of assistance.  Lupita Leash  [phone]

## 2017-08-10 NOTE — Assessment & Plan Note (Addendum)
Patient was started on wellbutrin 150mg  daily for MDD a couple of months ago, which he states he has been compliant with. He states the wellbutrin has helped decrease the amount of cigarettes he smokes, however he does not feel it has been effective for his depressive symptoms. He endorses one episode last week during which he secluded himself, cried and thought about dying for about 3 days due to being overwhelmed by the amount of medications he is on, his medical problems, and his financial situation. He denies that he wanted to take actions to harm himself or others even at that low-point. 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 He continues endorsing either sleeping too much or not sleeping enough, poor appetite, depressed mood and interest. PHQ9 in July was 12, today it is 4. He denies suicidal or homicidal ideation. He has not been able to go to [place] for counseling as he relies on his sister to provide transportation; his home does not have nearby public transportation.  Plan: --increase Wellbutrin 300mg  daily, provided one time no charge fill through [facility] --will work with pharmacist to possibly get patient qualified for free mail in pharmacy --EKG today shows manual QTc mildly prolonged at 472; will monitor

## 2017-08-10 NOTE — Assessment & Plan Note (Signed)
Patient continues to smoke 3 cigarettes a day, endorses habit to smoke after eating, however he denies cravings or even enjoying the cigarettes.  He is hopefull he will be able to stop completely.  Plan: --encouraged his progress --inc wellbutrin to 300mg  daily

## 2017-08-10 NOTE — Assessment & Plan Note (Signed)
He endorses one episode of back pain that radiated to his left chest and left arm; the pain began while he was sitting down and lasted about one minute. It was associated by nausea, but no vomiting, palpitations, shortness of breath, syncope. He states this was a different type of pain from his previous cardiac pain and did not feel like an ICD shock either. Episode occurred unrelated to recent cocaine use.  Patient asymptomatic now, with stable VS and exam.  Plan: --EKG - 1st degree AV block, no q waves or ischemic ST/T wave changes. QTc manual 472; will watch with wellbutrin

## 2017-08-10 NOTE — Assessment & Plan Note (Addendum)
Weight 340lbs today, stable from previous visit, however still elevated from HF appointment. He has 1+ bil pitting edema, stable shortness of breath with exertion only.  He endorses not always taking torsemide as prescribed due to not being able to afford refills.  Plan: --refilled torsemide through [facility] no charge, one time fill --will work with our pharmacist to possibly get him into free mail-in pharmacy --continue current regimen: spironolactone 12.5mg  daily, bidil 20-37.5 BID, entresto 49/51mg  BID, carvedilol 12.5mg  BID, potassium daily --he will f/u with Dr. [name] Oct 5th; encouraged him to keep appt.

## 2017-08-10 NOTE — Assessment & Plan Note (Signed)
Well controlled today.  Plan: --continue current therapy - spironolactone 12.5mg  daily, bidil 20-37.5 BID, entresto 49/51mg  BID, carvedilol 12.5mg  BID

## 2017-08-10 NOTE — Progress Notes (Signed)
Diabetes Self-Management Education  Visit Type:  (P) Follow-up  Appt. Start Time: 1500 Appt. End Time: 1530  08/10/2017  [name], identified by name and date of birth, is a 41 y.o. male with a diagnosis of Diabetes:  Marland Kitchen  Type 2 diabetes ASSESSMENT Reviewed his meter download with him . Encouraged blood sugars in target most of the time  Learning Objective:  Patient will have a greater understanding of diabetes self-management. Patient education plan is to attend individual and/or group sessions per assessed needs and concerns. My plan to support myself in continuing these changes to care for my diabetes is to attend or contact:   Emotional Support ? Depression & Bipolar Support Alliance- [phone]-www.dbsalliance.org o Find a local support groups & Therapist by zip code at [email or web address] o National phone number [phone]  Diabetes Support Groups Type 2 diabetes support group : 2nd Monday of every month from 6-7 PM at [address] [facility] conference room [phone] local support resources -doctor's office, CDE, Dietitian, pharmacist, church Plan:   Patient Instructions   1. Call to make an eye doctor appointment. Please ask him to fax the report to our office at [phone]  Francene Boyers, MD [facility]  Phone: [phone];        2.   Great job checking your blood sugars. You might consider tlkaing to Dr. Samuella Cota about checking your blood sugar 2-3 times a day for 3-4 days each week.   3. We can discuss low blood sugar, high blood sugar and preventing, detecting and caring for complications of diabetes at your next visit.  ( Feet, eyes, teeth, heart, kidneys)  Please make an appointment with me on the same day you see the doctor.  Call anytime I can be of assistance.  Lupita Leash  [phone]    Expected Outcomes:     Education material provided: Support group flyer  If problems or questions, patient to contact team via:   Phone  Future DSME appointment: -  next doctor appointment day Lindzy Rupert, Lupita Leash, RD 08/10/2017 6:27 PM.

## 2017-08-10 NOTE — Assessment & Plan Note (Signed)
Patient continues to use cocaine, last use about 1 week ago. He endorses its use mainly due to his MDD.   Plan: --encourage cessation of cocaine use --continue addressing MDD

## 2017-08-10 NOTE — Progress Notes (Signed)
CC: diabetes  HPI:  [name] is a 41 y.o. with a PMH of HTN, NICM, cCHF s/p ICD, MDD, tobacco use, cocaine use, T2DM presenting to clinic for f/u on his diabetes.  T2DM: Patient with last A1c of 13 in July 2018. Current regimen includes Lantus 25 units qhs, metformin 1000mg  qAM and 500mg  qPM, and last month he was started on bydureon 2mg  qweekly. Patient states he has noticed improvement of his polydipsia. He notes having a new onset rash after starting bydureon that is exacerbated each time he injects it. He is not interested in continuing bydureon or changing to another agent at this time. He brings his meter in today, which shows no hypoglycemic episodes, AM CBGs over last two weeks ranging from 91-150, and PM CBGs ranging from 104-207. He endorses improvement in his polydipsia, but is still having trouble with fatigue.   MDD:  Patient was started on wellbutrin 150mg  daily for MDD a couple of months ago, which he states he has been compliant with. He states the wellbutrin has helped decrease the amount of cigarettes he smokes, however he does not feel it has been effective for his depressive symptoms. He endorses one episode last week during which he secluded himself, cried and thought about dying for about 3 days due to being overwhelmed by the amount of medications he is on, his medical problems, and his financial situation. He denies that he wanted to take actions to harm himself or others even at that low-point. 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 He continues endorsing either sleeping too much or not sleeping enough, poor appetite, depressed mood and interest. PHQ9 in July was 12, today it is 21. He denies suicidal or homicidal ideation. He has not been able to go to [place] for counseling as he relies on his sister to provide transportation; his home does not have nearby public transportation.  He endorses one episode of back pain that radiated  to his left chest and left arm; the pain began while he was sitting down and lasted about one minute. It was associated by nausea, but no vomiting, palpitations, shortness of breath, syncope. He states this was a different type of pain from his previous cardiac pain and did not feel like an ICD shock either.   Patient endorses poor financial situation and inability to afford his medicine co-pays due to not being able to work. He has applied for disability insurance in the past but has been denied per patient. He attempted helping a friend out to try to increase his stamina and possibly obtain a job, however, he quickly became dyspneic and had to rest. He endorses not being able to do much more activity than his ADLs at home; even so he endorses taking frequent breaks.  He denies cough, sputum production; states his LE edema is minimal right now. He endorses rationing out his torsemide and not taking full doses at all times due to inability to pay for refills at times.  Please see problem based Assessment and Plan for status of patients chronic conditions.  Past Medical History:  Diagnosis Date  . Depression, major, single episode 01/14/2017  . Diabetes mellitus (HCC)   . ED (erectile dysfunction)   . History of syncope 01/29/2015  . Hypertension   . 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 Asymptomatic.   03/16/2017 Nonischemic cardiomyopathy (HCC)    a.  echo 4/06: EF 30%, mild to mod  MR, mild RAE, inf HK, lat HK , ant HK;    b.  cath 4/06: no CAD, EF 20-25%  . NSVT (nonsustained ventricular tachycardia) (HCC)   . Obesity   . Systolic CHF, chronic (HCC)    EF 11/17 25-30%, s/p ICD    Review of Systems:   ROS Per HPI  Physical Exam:  Vitals:   08/10/17 1407  BP: 129/69  Pulse: 81  Temp: 98.1 F (36.7 C)  TempSrc: Oral  SpO2: 100%  Weight: (!) 340 lb (154.2 kg)  Height: 6\' 4"  (1.93 m)   GENERAL- alert, co-operative, appears as stated age, not in any  distress. HEENT- Atraumatic, normocephalic, oral mucosa appears moist CARDIAC- RRR, no murmurs, rubs or gallops. RESP- Moving equal volumes of air, and clear to auscultation bilaterally, no wheezes or crackles. ABDOMEN- Soft, nontender, bowel sounds present. NEURO- No obvious Cr N abnormality. EXTREMITIES- pulse 2+, symmetric, 1+ pitting edema in bil LEs. SKIN- Warm, dry. Evidence of healing, excoriated, papular rash on ventral portion of left arm and abdomen; no signs of infection. PSYCH- Normal mood and affect, appropriate thought content and speech.  Assessment & Plan:   See Encounters Tab for problem based charting.   Patient discussed with Dr. , MD Internal Medicine PGY2

## 2017-08-11 NOTE — Progress Notes (Signed)
Internal Medicine Clinic Attending  Case discussed with Dr. Svalina  at the time of the visit.  We reviewed the resident's history and exam and pertinent patient test results.  I agree with the assessment, diagnosis, and plan of care documented in the resident's note.  Alexander N Raines, MD   

## 2017-08-19 ENCOUNTER — Ambulatory Visit (HOSPITAL_BASED_OUTPATIENT_CLINIC_OR_DEPARTMENT_OTHER)
Admission: RE | Admit: 2017-08-19 | Discharge: 2017-08-19 | Disposition: A | Discharge status: Home / Self Care | Payer: Medicaid Other | Source: Ambulatory Visit | Attending: Internal Medicine | Admitting: Internal Medicine

## 2017-08-19 ENCOUNTER — Encounter (HOSPITAL_COMMUNITY): Payer: Self-pay | Admitting: Internal Medicine

## 2017-08-19 ENCOUNTER — Ambulatory Visit (HOSPITAL_COMMUNITY)
Admission: RE | Admit: 2017-08-19 | Discharge: 2017-08-19 | Disposition: A | Discharge status: Home / Self Care | Payer: Medicaid Other | Source: Ambulatory Visit | Attending: Internal Medicine | Admitting: Internal Medicine

## 2017-08-19 VITALS — BP 142/90 | HR 72 | Wt 343.5 lb

## 2017-08-19 DIAGNOSIS — I081 Rheumatic disorders of both mitral and tricuspid valves: Secondary | ICD-10-CM | POA: Diagnosis not present

## 2017-08-19 DIAGNOSIS — I10 Essential (primary) hypertension: Secondary | ICD-10-CM

## 2017-08-19 DIAGNOSIS — I5042 Chronic combined systolic (congestive) and diastolic (congestive) heart failure: Secondary | ICD-10-CM

## 2017-08-19 LAB — ECHOCARDIOGRAM COMPLETE
AVLVOTPG: 3 mmHg
E decel time: 187 msec
E/e' ratio: 8.59
FS: 24 % — AB (ref 28–44)
IV/PV OW: 1.12
LA diam end sys: 55 mm
LA vol index: 35.8 mL/m2
LA vol: 99.4 mL
LADIAMINDEX: 1.98 cm/m2
LASIZE: 55 mm
LAVOLA4C: 98.6 mL
LV E/e'average: 8.59
LV PW d: 10.7 mm — AB (ref 0.6–1.1)
LV TDI E'LATERAL: 7.18
LV TDI E'MEDIAL: 4.79
LV dias vol index: 99 mL/m2
LV sys vol: 165 mL — AB (ref 21–61)
LVDIAVOL: 274 mL — AB (ref 62–150)
LVEEMED: 8.59
LVELAT: 7.18 cm/s
LVOT VTI: 17.8 cm
LVOT peak vel: 91.4 cm/s
LVSYSVOLIN: 59 mL/m2
MV Dec: 187
MV pk E vel: 61.7 m/s
MVPKAVEL: 91.3 m/s
RV LATERAL S' VELOCITY: 10.9 cm/s
RV TAPSE: 21.4 mm
RV sys press: 23 mmHg
Reg peak vel: 221 cm/s
Simpson's disk: 40
Stroke v: 109 ml
TRMAXVEL: 221 cm/s

## 2017-08-19 MED ORDER — SACUBITRIL-VALSARTAN 97-103 MG PO TABS: 1.0000 | ORAL_TABLET | Freq: Two times a day (BID) | ORAL | 3 refills | Status: DC

## 2017-08-19 NOTE — Patient Instructions (Signed)
Increase Entresto 97/103 mg (1 tab), twice a day  Your physician recommends that you return for lab work in: 2 weeks   Your physician recommends that you schedule a follow-up appointment in: 3 months

## 2017-08-19 NOTE — Progress Notes (Signed)
Medication Samples have been provided to the patient.  Drug name: Sherryll Burger       Strength: 49/51        Qty: 2  LOT: [id number]  Exp.Date: 5/20  Dosing instructions: Take 2 tabs twice a day  The patient has been instructed regarding the correct time, dose, and frequency of taking this medication, including desired effects and most common side effects.   Aime Carreras 3:00 PM 08/19/2017

## 2017-08-19 NOTE — Progress Notes (Signed)
Patient ID: Douglas [name], male   DOB: March 02, 1976, 41 y.o.   MRN: [id number]  EP: Dr Graciela Husbands PCP: Dr. Johna Roles Primary Cardiologist: Dr. [name]  History of Present Illness: Douglas [name] is a 41 y.o. male with a h/o HTN, HL, obesity, NICM and chronic systolic heart failure. He is S/P Medtronic ICD due to NICM. Management has been complicated by severe non-compliance.   August 2012 CPX VO2 20  Underwent cath in 2006 which showed normal cors with EF 20-25%.   EF had recovered in late 2013 but has again fallen to 15% per echo 01/20/13.  He was admitted to [facility]/21/13 from clinic for low output symptoms and volume overload.  He required IV lasix and metolazone.  He diuresed 19 pounds with discharge weight of 254 pounds.  He had runs of VT therefore he was started on amiodarone per EP and Lifevest was placed at discharge.  He underwent RHC on 02/22/13.  He has not required metolazone.   He was admitted in 1/16 with VT and ICD shock x 2.   Today he returns for HF follow up. Overall feeling just ok. Complaining of fatgiue. SOB with steps. Not working. Having trouble paying medications. Smoking 3 cigarettes. Last used cocaine 6 months ago. Says he has trouble paying for medications. Drinking lots of fluids.   Optivol: fluid index above threshold. Activity ~2 hours per day.      RHC 02/22/13  RA = 12  RV = 49/6/15  PA = 54/29 (41)  PCW = 28 (v= 40)  Fick cardiac output/index = 4.3/1.7  PVR = 2.6 Woods  SVR = 1580 dynes  FA sat = 98%  PA sat = 53%, 54%  Non-invasive BP = 124/86 (97)  RHC 05/03/13 RA = 2  RV = 29/2/2  PA = 26/10 (17)  PCW = 3  Fick cardiac output/index = 6.1/2.3  PVR = 2.3  FA sat = 93%  PA sat = 65%, 67%  ECHO 06/25/13 EF 20-25% ECHO 05/01/14 EF 35-40%, moderately dilated LV, mild LVH, diffuse hypokinesis Echo 6/16 EF 25-30%, mild LVH, moderately dilated LV.  ECHO 09/23/2016 EF 25-30%.  Labs (10/15/13): K 3.9 Creatinine 0.99 Labs (3/15): K 4, creatinine  1.04, TSH normal, LFTs, normal Labs (12/10/2014): K 4.4 Creatinine 1.42   SH: lives with his Mom. Rare ETOH, smokes rarely. Remote cocaine.  FH: Mom has CAD.    ROS:  Please see the history of present illness.  He denies fevers, chills, melena, hematochezia.  All other systems reviewed and negative.  Past Medical History:  Diagnosis Date  . Depression, major, single episode 01/14/2017  . Diabetes mellitus (HCC)   . ED (erectile dysfunction)   . History of syncope 01/29/2015  . Hypertension   . 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 Asymptomatic.   Marland Kitchen Nonischemic cardiomyopathy (HCC)    a.  echo 4/06: EF 30%, mild to mod MR, mild RAE, inf HK, lat HK , ant HK;    b.  cath 4/06: no CAD, EF 20-25%  . NSVT (nonsustained ventricular tachycardia) (HCC)   . Obesity   . Systolic CHF, chronic (HCC)    EF 11/17 25-30%, s/p ICD    Current Outpatient Prescriptions  Medication Sig Dispense Refill  . amiodarone (PACERONE) 200 MG tablet Take 1 tablet (200 mg total) by mouth daily. 30 tablet 5  . aspirin EC 81 MG EC tablet Take 1 tablet (81 mg total) by mouth daily.  30 tablet 0  . atorvastatin (LIPITOR) 40 MG tablet Take 1 tablet (40 mg total) by mouth daily. 30 tablet 11  . BIDIL 20-37.5 MG tablet TAKE 1 TABLET BY MOUTH 3 TIMES DAILY. 90 tablet 3  . buPROPion (WELLBUTRIN XL) 300 MG 24 hr tablet Take 1 tablet (300 mg total) by mouth daily. 90 tablet 1  . carvedilol (COREG) 12.5 MG tablet TAKE 1 TABLET BY MOUTH 2 TIMES DAILY WITH A MEAL. 60 tablet 2  . glucose blood test strip The patient is insulin requiring, ICD 10 code E11.9. The patient tests 3 times per day. 100 each 12  . Insulin Glargine (LANTUS) 100 UNIT/ML Solostar Pen Inject 20 Units into the skin daily at 10 pm. 15 mL 1  . Insulin Pen Needle (BD PEN NEEDLE NANO U/F) 32G X 4 MM MISC Inject 1 application into the skin at bedtime. The patient is insulin requiring, ICD 10 code E11.9. The patient injects 1  times per day. 100 each 3  . Lancets 30G MISC 1 applicator by Does not apply route 4 (four) times daily. The patient is insulin requiring, ICD 10 code E11.9.The patient tests 4x per day. 100 each 5  . metFORMIN (GLUCOPHAGE-XR) 500 MG 24 hr tablet Take 1 tablet (500 mg total) by mouth 2 (two) times daily with a meal. 180 tablet 1  . potassium chloride SA (K-DUR,KLOR-CON) 20 MEQ tablet Take 2 tablets (40 mEq total) by mouth daily. 60 tablet 3  . sacubitril-valsartan (ENTRESTO) 49-51 MG Take 1 tablet by mouth 2 (two) times daily. Needs office visit 60 tablet 3  . spironolactone (ALDACTONE) 25 MG tablet Take 1 tablet (25 mg total) by mouth at bedtime. 90 tablet 3  . torsemide (DEMADEX) 20 MG tablet TAKE 4 TABLETS BY MOUTH IN THE MORNING AND 3 TABLETS IN EVENING 210 tablet 3  . metolazone (ZAROXOLYN) 5 MG tablet Take only as directed by CHF clinic ([phone]) (Patient not taking: Reported on 06/17/2017) 10 tablet 0   No current facility-administered medications for this encounter.     Allergies  Allergen Reactions  . Bydureon [Exenatide] Rash  . Penicillins Other (See Comments)    Unknown childhood reaction     Vitals:   08/19/17 1353  BP: (!) 142/90  Pulse: 72  SpO2: 100%  Weight: (!) 343 lb 8 oz (155.8 kg)   Wt Readings from Last 3 Encounters:  08/19/17 (!) 343 lb 8 oz (155.8 kg)  08/10/17 (!) 340 lb (154.2 kg)  07/08/17 (!) 340 lb 6.4 oz (154.4 kg)   PHYSICAL EXAM: General:  Well appearing. No resp difficulty HEENT: normal Neck: supple.  JVD~10 . Carotids 2+ bilat; no bruits. No lymphadenopathy or thryomegaly appreciated. Cor: PMI nondisplaced. Regular rate & rhythm. No rubs, gallops or murmurs. Lungs: clear Abdomen: obese, soft, nontender, ++distended. No hepatosplenomegaly. No bruits or masses. Good bowel sounds. Extremities: no cyanosis, clubbing, rash, RLE and LLE trace edema Neuro: alert & orientedx3, cranial nerves grossly intact. moves all 4 extremities w/o difficulty.  Affect pleasant   1) Chronic systolic HF: Nonischemic cardiomyopathy. Echo 09/2016 with EF 25-30%, RV systolic function mildly reduced.  S/P Medtronic ICD 08/2013.   - NYHA II. Todays ECHO EF 40-45% reviewed by Dr [name]  - Optivol- fluid trending up . Activity 2 hours per day. No VT.  - Continue torsemide 80mg /40mg . Having trouble paying for medications. Needs to take as prescribed. Discussed.  - Continue Coreg 12.5 mg BID -Continue Spiro to 25 mg daily.  -  Continue Bidil TID - Increase entresto to 97/103 twice a day.  2) HTN:  - Elevated. Increase entresto 97-103 twice a day.  3) VT:  - Continue amio 200 mg daily.  - Recent LFT's stable.  - No VT/VF  4) Cocaine abuse:   Last used 6 months  5) Tobacco Abuse -Discussed smoking cessation.   6) DMII- on insulin.   RTC in 2 weeks for BMET. Follow up in 3 months.    Tonye Becket, NP  08/19/2017   Patient seen and examined with Tonye Becket, NP. We discussed all aspects of the encounter. I agree with the assessment and plan as stated above.   Echo reviewed personally. EF 40-45%. ICD interrogated in person. Overall improved with NYHA II-III symptoms. Volume status and BP up. Will increase Entresto to 97/103 bid. Stressed need to be compliant with medications and contact us if he is running out.   [name], MD  7:08 PM

## 2017-08-19 NOTE — Progress Notes (Signed)
  Echocardiogram 2D Echocardiogram has been performed.  Delcie Roch 08/19/2017, 1:56 PM

## 2017-08-22 NOTE — Progress Notes (Signed)
Medical examination form  For [place] support was enforcement was completeds and signed by Dr. [name]. Original copy was given to pt to be processed a designated agency. A copy was made and sent to medical records.

## 2017-08-22 NOTE — Addendum Note (Signed)
Encounter addended by: Teresa Coombs, RN on: 08/22/2017  3:10 PM<BR>    Actions taken: Sign clinical note

## 2017-08-23 MED FILL — POTASSIUM CL ER 20 MEQ TAB: 20 | 30 days supply | Qty: 60 | Fill #2

## 2017-08-23 MED FILL — LANTUS SOLOSTAR 100 UNITS/M: 100 | 30 days supply | Qty: 6 | Fill #2

## 2017-08-23 MED FILL — ENTRESTO 49 MG-51 MG TABLET: 49-51 | 30 days supply | Qty: 60 | Fill #3

## 2017-08-23 MED FILL — ACCU-CHEK SMARTVIEW STRIP: 33 days supply | Qty: 100 | Fill #2

## 2017-08-23 MED FILL — AMIODARONE HCL 200 MG TAB: 200 | 30 days supply | Qty: 30 | Fill #1

## 2017-08-23 MED FILL — BIDIL TABLET: 20-37.5 | 30 days supply | Qty: 90 | Fill #1

## 2017-08-23 MED FILL — ATORVASTATIN 40 MG TABLET: 40 | 30 days supply | Qty: 30 | Fill #3

## 2017-08-23 MED FILL — CARVEDILOL 12.5 MG TABS: 12.5 | 30 days supply | Qty: 60 | Fill #2

## 2017-09-02 ENCOUNTER — Inpatient Hospital Stay (HOSPITAL_COMMUNITY): Admission: RE | Admit: 2017-09-02 | Payer: Medicaid Other | Source: Ambulatory Visit

## 2017-09-05 ENCOUNTER — Ambulatory Visit (INDEPENDENT_AMBULATORY_CARE_PROVIDER_SITE_OTHER): Payer: Medicaid Other

## 2017-09-05 ENCOUNTER — Telehealth: Payer: Self-pay | Admitting: Cardiology

## 2017-09-05 DIAGNOSIS — I5042 Chronic combined systolic (congestive) and diastolic (congestive) heart failure: Secondary | ICD-10-CM | POA: Diagnosis not present

## 2017-09-05 DIAGNOSIS — Z95810 Presence of automatic (implantable) cardiac defibrillator: Secondary | ICD-10-CM

## 2017-09-05 NOTE — Telephone Encounter (Signed)
LMOVM reminding pt to send remote transmission.   

## 2017-09-06 ENCOUNTER — Other Ambulatory Visit: Payer: Self-pay | Admitting: Internal Medicine

## 2017-09-06 NOTE — Progress Notes (Signed)
EPIC Encounter for ICM Monitoring  Patient Name: FREELAND PRACHT is a 41 y.o. male Date: 09/06/2017 Primary Care Physican: Alphonzo Grieve, MD Primary Cardiologist:Bensimhon Electrophysiologist: Faustino Congress Weight:does not weigh   Since 04-Aug-2017 VT-NS (>4 beats, >200 bpm) 3      Heart Failure questions reviewed, pt asymptomatic.   Thoracic impedance normal but was abnormal suggesting fluid accumulation from 08/02/2017 to 08/31/2017.  Prescribed dosage: Torsemide 20 mg 4 tablets (80 mg total) every AM and 3 tablets (60 mg total) every PM. Potassium 20 mEq 2 tablets (40 mEq total) daily.   Labs: 06/24/2017 Creatinine 1.29, BUN 17, Potassium 4.0, Sodium 136, EGFR >60 06/08/2017 Creatinine 1.59, BUN 21, Potassium 3.6, Sodium 134, EGFR >60 05/09/2017 Creatinine 1.59, BUN 24, Potassium 3.6, Sodium 131, EGFR 52->60 05/02/2017 Creatinine 1.35, BUN 14, Potassium 3.5, Sodium 136, EGFR >60 01/05/2017 Creatinine 1.46, BUN 18, Potassium 4.1, Sodium 135, EGFR 58->60 10/04/2016 Creatinine 1.42, BUN 11, Potassium 3.3, Sodium 138, EGFR >60 09/23/2016 Creatinine 1.28, BUN 16, Potassium 3.7, Sodium 136, EGFR >60  06/01/2016 Creatinine 1.34, BUN 6, Potassium 3.4, Sodium 139, EGFR >60 01/09/2016 Creatinine 1.26, BUN 15, Potassium 3.3, Sodium 138, EGFR 70-81 05/07/2015 Creatinine 1.30, BUN 18, Potassium 3.5, Sodium 139  Recommendations: No changes.  He said she has been drinking a lot of fluids and advised to limit fluid intake to < 2 liters/day.  Encouraged to call for fluid symptoms.  Follow-up plan: ICM clinic phone appointment on 10/10/2017.   Copy of ICM check sent to Dr. Caryl Comes.   3 month ICM trend: 09/06/2017   1 Year ICM trend:      Rosalene Billings, RN 09/06/2017 12:24 PM

## 2017-10-05 ENCOUNTER — Other Ambulatory Visit (HOSPITAL_COMMUNITY): Payer: Self-pay | Admitting: Internal Medicine

## 2017-10-05 MED FILL — POTASSIUM CL ER 20 MEQ TAB: 20 | 30 days supply | Qty: 60 | Fill #3

## 2017-10-05 MED FILL — LANTUS SOLOSTAR 100 UNITS/M: 100 | 30 days supply | Qty: 6 | Fill #3

## 2017-10-05 MED FILL — AMIODARONE HCL 200 MG TAB: 200 | 30 days supply | Qty: 30 | Fill #2

## 2017-10-05 MED FILL — METFORMIN HCL ER 500 MG TAB: 500 | 90 days supply | Qty: 180 | Fill #1

## 2017-10-05 MED FILL — TORSEMIDE 20 MG TABS: 20 | 30 days supply | Qty: 210 | Fill #0

## 2017-10-05 MED FILL — SPIRONOLACTONE 25 MG TABLET: 25 | 90 days supply | Qty: 90 | Fill #1

## 2017-10-05 MED FILL — ATORVASTATIN 40 MG TABLET: 40 | 30 days supply | Qty: 30 | Fill #4

## 2017-10-10 ENCOUNTER — Ambulatory Visit (INDEPENDENT_AMBULATORY_CARE_PROVIDER_SITE_OTHER): Payer: Medicaid Other

## 2017-10-10 ENCOUNTER — Other Ambulatory Visit: Payer: Self-pay | Admitting: *Deleted

## 2017-10-10 DIAGNOSIS — Z95810 Presence of automatic (implantable) cardiac defibrillator: Secondary | ICD-10-CM | POA: Diagnosis not present

## 2017-10-10 DIAGNOSIS — I5042 Chronic combined systolic (congestive) and diastolic (congestive) heart failure: Secondary | ICD-10-CM | POA: Diagnosis not present

## 2017-10-10 NOTE — Progress Notes (Signed)
EPIC Encounter for ICM Monitoring  Patient Name: Douglas [name] is a 41 y.o. male Date: 10/10/2017 Primary Care Physican: Alphonzo Grieve, MD Primary Cardiologist:Bensimhon Electrophysiologist: Faustino Congress Weight:does not weigh       Heart Failure questions reviewed, pt asymptomatic and stated he feels fine.   Thoracic impedance abnormal suggesting fluid accumulation since 10/01/2017.  Prescribed dosage: Torsemide 20 mg 4 tablets (80 mg total) every AM and 3 tablets (60 mg total) every PM. Potassium 20 mEq 2 tablets (40 mEq total) daily.   Labs: 06/24/2017 Creatinine 1.29, BUN 17, Potassium 4.0, Sodium 136, EGFR >60 06/08/2017 Creatinine 1.59, BUN 21, Potassium 3.6, Sodium 134, EGFR >60 05/09/2017 Creatinine 1.59, BUN 24, Potassium 3.6, Sodium 131, EGFR 52->60 05/02/2017 Creatinine 1.35, BUN 14, Potassium 3.5, Sodium 136, EGFR >60 01/05/2017 Creatinine 1.46, BUN 18, Potassium 4.1, Sodium 135, EGFR 58->60 10/04/2016 Creatinine 1.42, BUN 11, Potassium 3.3, Sodium 138, EGFR >60 09/23/2016 Creatinine 1.28, BUN 16, Potassium 3.7, Sodium 136, EGFR >60  06/01/2016 Creatinine 1.34, BUN 6, Potassium 3.4, Sodium 139, EGFR >60 01/09/2016 Creatinine 1.26, BUN 15, Potassium 3.3, Sodium 138, EGFR 70-81 05/07/2015 Creatinine 1.30, BUN 18, Potassium 3.5, Sodium 139  Recommendations: No changes.  Advised to limit salt intake to 2000 mg/day and fluid intake to < 2 liters/day.  Encouraged to call for fluid symptoms.  Follow-up plan: ICM clinic phone appointment on 10/17/2017 to recheck fluid levels.    Copy of ICM check sent to Dr. Caryl Comes and Dr. Haroldine Laws.   3 month ICM trend: 10/10/2017    1 Year ICM trend:       Rosalene Billings, RN 10/10/2017 12:32 PM

## 2017-10-12 MED ORDER — CARVEDILOL 12.5 MG PO TABS: ORAL_TABLET | ORAL | 2 refills | Status: DC

## 2017-10-17 ENCOUNTER — Telehealth: Payer: Self-pay | Admitting: Cardiology

## 2017-10-17 NOTE — Telephone Encounter (Signed)
LMOVM reminding pt to send remote transmission.   

## 2017-10-21 ENCOUNTER — Other Ambulatory Visit: Payer: Self-pay | Admitting: Internal Medicine

## 2017-10-21 ENCOUNTER — Other Ambulatory Visit (HOSPITAL_COMMUNITY): Payer: Self-pay | Admitting: Internal Medicine

## 2017-10-21 NOTE — Progress Notes (Signed)
No ICM remote transmission received for 10/17/2017 and next ICM transmission scheduled for 11/25/2017.

## 2017-10-25 ENCOUNTER — Other Ambulatory Visit (HOSPITAL_COMMUNITY): Payer: Self-pay | Admitting: *Deleted

## 2017-10-25 MED ORDER — CARVEDILOL 12.5 MG PO TABS: ORAL_TABLET | ORAL | 2 refills | Status: DC

## 2017-10-26 ENCOUNTER — Encounter: Payer: Medicaid Other | Admitting: Internal Medicine

## 2017-11-03 ENCOUNTER — Telehealth: Payer: Self-pay | Admitting: Cardiology

## 2017-11-03 ENCOUNTER — Ambulatory Visit (INDEPENDENT_AMBULATORY_CARE_PROVIDER_SITE_OTHER): Payer: Medicaid Other | Admitting: *Deleted

## 2017-11-03 DIAGNOSIS — I428 Other cardiomyopathies: Secondary | ICD-10-CM

## 2017-11-03 DIAGNOSIS — I472 Ventricular tachycardia, unspecified: Secondary | ICD-10-CM

## 2017-11-03 NOTE — Telephone Encounter (Signed)
LMOVM reminding pt to send remote transmission.   

## 2017-11-04 ENCOUNTER — Encounter: Payer: Self-pay | Admitting: Cardiology

## 2017-11-04 NOTE — Progress Notes (Signed)
Remote ICD transmission.   

## 2017-11-07 MED FILL — CARVEDILOL 12.5 MG TABS: 12.5 | 30 days supply | Qty: 60 | Fill #0

## 2017-11-07 MED FILL — LANTUS SOLOSTAR 100 UNITS/M: 100 | 30 days supply | Qty: 6 | Fill #4

## 2017-11-07 MED FILL — ENTRESTO 97 MG-103 MG TAB: 97-103 | 30 days supply | Qty: 60 | Fill #0

## 2017-11-10 ENCOUNTER — Ambulatory Visit: Payer: Medicaid Other

## 2017-11-10 DIAGNOSIS — Z95810 Presence of automatic (implantable) cardiac defibrillator: Secondary | ICD-10-CM

## 2017-11-10 DIAGNOSIS — I5042 Chronic combined systolic (congestive) and diastolic (congestive) heart failure: Secondary | ICD-10-CM

## 2017-11-11 NOTE — Progress Notes (Signed)
EPIC Encounter for ICM Monitoring  Patient Name: Douglas [name] is a 41 y.o. male Date: 11/11/2017 Primary Care Physican: Alphonzo Grieve, MD Primary Cardiologist:Bensimhon Electrophysiologist: Faustino Congress Weight:does not weigh      Heart Failure questions reviewed, pt asymptomatic.   Thoracic impedance normal.  Prescribed dosage: Torsemide 20 mg 4 tablets (80 mg total) every AM and 3 tablets (60 mg total) every PM. Potassium 20 mEq 2 tablets (40 mEq total) daily.   Labs: 06/24/2017 Creatinine 1.29, BUN 17, Potassium 4.0, Sodium 136, EGFR >60 06/08/2017 Creatinine 1.59, BUN 21, Potassium 3.6, Sodium 134, EGFR >60 05/09/2017 Creatinine 1.59, BUN 24, Potassium 3.6, Sodium 131, EGFR 52->60 05/02/2017 Creatinine 1.35, BUN 14, Potassium 3.5, Sodium 136, EGFR >60 01/05/2017 Creatinine 1.46, BUN 18, Potassium 4.1, Sodium 135, EGFR 58->60 10/04/2016 Creatinine 1.42, BUN 11, Potassium 3.3, Sodium 138, EGFR >60 09/23/2016 Creatinine 1.28, BUN 16, Potassium 3.7, Sodium 136, EGFR >60  06/01/2016 Creatinine 1.34, BUN 6, Potassium 3.4, Sodium 139, EGFR >60 01/09/2016 Creatinine 1.26, BUN 15, Potassium 3.3, Sodium 138, EGFR 70-81 05/07/2015 Creatinine 1.30, BUN 18, Potassium 3.5, Sodium 139  Recommendations: No changes.   Encouraged to call for fluid symptoms.  Follow-up plan: ICM clinic phone appointment on 12/12/2017.  Office appointment scheduled 11/28/2017 with HF Clinic NP/PA.  Copy of ICM check sent to Dr. Caryl Comes.   3 month ICM trend: 11/05/2017    1 Year ICM trend:       Rosalene Billings, RN 11/11/2017 1:46 PM

## 2017-11-12 LAB — CUP PACEART REMOTE DEVICE CHECK
Brady Statistic RV Percent Paced: 0.01 %
HighPow Impedance: 54 Ohm
HighPow Impedance: 76 Ohm
Implantable Lead Implant Date: 20141009
Implantable Lead Location: 753860
Implantable Pulse Generator Implant Date: 20141009
Lead Channel Pacing Threshold Amplitude: 0.75 V
Lead Channel Sensing Intrinsic Amplitude: 10.5 mV
Lead Channel Setting Sensing Sensitivity: 0.45 mV
MDC IDC MSMT BATTERY REMAINING LONGEVITY: 96 mo
MDC IDC MSMT BATTERY VOLTAGE: 2.99 V
MDC IDC MSMT LEADCHNL RV IMPEDANCE VALUE: 342 Ohm
MDC IDC MSMT LEADCHNL RV IMPEDANCE VALUE: 437 Ohm
MDC IDC MSMT LEADCHNL RV PACING THRESHOLD PULSEWIDTH: 0.4 ms
MDC IDC MSMT LEADCHNL RV SENSING INTR AMPL: 10.5 mV
MDC IDC SESS DTM: 20181221010900
MDC IDC SET LEADCHNL RV PACING AMPLITUDE: 2.5 V
MDC IDC SET LEADCHNL RV PACING PULSEWIDTH: 0.4 ms

## 2017-11-17 ENCOUNTER — Other Ambulatory Visit: Payer: Self-pay | Admitting: Internal Medicine

## 2017-11-17 DIAGNOSIS — N182 Chronic kidney disease, stage 2 (mild): Principal | ICD-10-CM

## 2017-11-17 DIAGNOSIS — E1122 Type 2 diabetes mellitus with diabetic chronic kidney disease: Secondary | ICD-10-CM

## 2017-11-17 MED FILL — POTASSIUM CL ER 20 MEQ TAB: 20 | 30 days supply | Qty: 60 | Fill #0

## 2017-11-17 MED FILL — BIDIL TABLET: 20-37.5 | 30 days supply | Qty: 90 | Fill #2

## 2017-11-17 MED FILL — TORSEMIDE 20 MG TABS: 20 | 30 days supply | Qty: 210 | Fill #1

## 2017-11-17 MED FILL — ATORVASTATIN 40 MG TABLET: 40 | 30 days supply | Qty: 30 | Fill #5

## 2017-11-17 MED FILL — AMIODARONE HCL 200 MG TAB: 200 | 30 days supply | Qty: 30 | Fill #3

## 2017-11-28 ENCOUNTER — Encounter (HOSPITAL_COMMUNITY): Payer: Medicaid Other

## 2017-12-12 ENCOUNTER — Ambulatory Visit (INDEPENDENT_AMBULATORY_CARE_PROVIDER_SITE_OTHER): Payer: Medicaid Other

## 2017-12-12 DIAGNOSIS — Z95810 Presence of automatic (implantable) cardiac defibrillator: Secondary | ICD-10-CM

## 2017-12-12 DIAGNOSIS — I5042 Chronic combined systolic (congestive) and diastolic (congestive) heart failure: Secondary | ICD-10-CM | POA: Diagnosis not present

## 2017-12-12 NOTE — Progress Notes (Signed)
EPIC Encounter for ICM Monitoring  Patient Name: Douglas [name] is a 42 y.o. male Date: 12/12/2017 Primary Care Physican: Alphonzo Grieve, MD Primary Cardiologist:Bensimhon Electrophysiologist: Faustino Congress Weight:does not weigh                                            Attempted call to patient and unable to reach.  Left message to return call.  Transmission reviewed.    Thoracic impedance normal today but was abnormal suggesting fluid accumulation from 12/01/2016 until today with exception of 2 days at baseline.  Prescribed dosage: Torsemide 20 mg 4 tablets (80 mg total) every AM and 3 tablets (60 mg total) every PM. Potassium 20 mEq 2 tablets (40 mEq total) daily.   Labs: 06/24/2017 Creatinine 1.29, BUN 17, Potassium 4.0, Sodium 136, EGFR >60 06/08/2017 Creatinine 1.59, BUN 21, Potassium 3.6, Sodium 134, EGFR >60 05/09/2017 Creatinine 1.59, BUN 24, Potassium 3.6, Sodium 131, EGFR 52->60 05/02/2017 Creatinine 1.35, BUN 14, Potassium 3.5, Sodium 136, EGFR >60 01/05/2017 Creatinine 1.46, BUN 18, Potassium 4.1, Sodium 135, EGFR 58->60 10/04/2016 Creatinine 1.42, BUN 11, Potassium 3.3, Sodium 138, EGFR >60 09/23/2016 Creatinine 1.28, BUN 16, Potassium 3.7, Sodium 136, EGFR >60  06/01/2016 Creatinine 1.34, BUN 6, Potassium 3.4, Sodium 139, EGFR >60 01/09/2016 Creatinine 1.26, BUN 15, Potassium 3.3, Sodium 138, EGFR 70-81 05/07/2015 Creatinine 1.30, BUN 18, Potassium 3.5, Sodium 139  Recommendations: NONE - Unable to reach.  Follow-up plan: ICM clinic phone appointment on 01/12/2018.   Copy of ICM check sent to Dr. Caryl Comes and Dr. Haroldine Laws.   3 month ICM trend: 12/12/2017    1 Year ICM trend:       Rosalene Billings, RN 12/12/2017 2:05 PM

## 2017-12-13 ENCOUNTER — Encounter: Payer: Self-pay | Admitting: Internal Medicine

## 2017-12-13 ENCOUNTER — Ambulatory Visit: Payer: Medicaid Other

## 2017-12-13 ENCOUNTER — Telehealth: Payer: Self-pay

## 2017-12-13 NOTE — Telephone Encounter (Signed)
Remote ICM transmission received.  Attempted call to patient and no answer and no message.  

## 2017-12-15 ENCOUNTER — Other Ambulatory Visit: Payer: Self-pay | Admitting: Internal Medicine

## 2017-12-15 DIAGNOSIS — N182 Chronic kidney disease, stage 2 (mild): Principal | ICD-10-CM

## 2017-12-15 DIAGNOSIS — E1122 Type 2 diabetes mellitus with diabetic chronic kidney disease: Secondary | ICD-10-CM

## 2017-12-16 MED FILL — LANTUS SOLOSTAR 100 UNITS/M: 100 | 30 days supply | Qty: 6 | Fill #0

## 2017-12-16 NOTE — Telephone Encounter (Signed)
Please have patient make app in [facility] within 1-2 weeks for diabetes. Thank you! Per Dr Samuella Cota.

## 2017-12-21 MED FILL — CARVEDILOL 12.5 MG TABS: 12.5 | 30 days supply | Qty: 60 | Fill #1

## 2017-12-21 MED FILL — TORSEMIDE 20 MG TABS: 20 | 30 days supply | Qty: 210 | Fill #2

## 2017-12-21 MED FILL — UNIFINE PENTIPS 32GX5/32: 32G X 4 MM | 30 days supply | Qty: 100 | Fill #1

## 2017-12-21 MED FILL — UNIFINE PENTIPS 32GX5/32": 32G X 4 MM | 30 days supply | Qty: 100 | Fill #1

## 2017-12-21 MED FILL — BIDIL TABLET: 20-37.5 | 30 days supply | Qty: 90 | Fill #3

## 2017-12-21 MED FILL — ENTRESTO 97 MG-103 MG TAB: 97-103 | 30 days supply | Qty: 60 | Fill #1

## 2018-01-11 ENCOUNTER — Ambulatory Visit: Payer: Medicaid Other

## 2018-01-12 ENCOUNTER — Ambulatory Visit (INDEPENDENT_AMBULATORY_CARE_PROVIDER_SITE_OTHER): Payer: Medicaid Other

## 2018-01-12 ENCOUNTER — Telehealth: Payer: Self-pay

## 2018-01-12 DIAGNOSIS — Z95810 Presence of automatic (implantable) cardiac defibrillator: Secondary | ICD-10-CM

## 2018-01-12 DIAGNOSIS — I5042 Chronic combined systolic (congestive) and diastolic (congestive) heart failure: Secondary | ICD-10-CM | POA: Diagnosis not present

## 2018-01-12 NOTE — Telephone Encounter (Signed)
Remote ICM transmission received.  Attempted call to patient and was the recording for [employer].  No message left.

## 2018-01-12 NOTE — Progress Notes (Signed)
EPIC Encounter for ICM Monitoring  Patient Name: Douglas [name] is a 42 y.o. male Date: 01/12/2018 Primary Care Physican: Alphonzo Grieve, MD Primary Cardiologist:Bensimhon Electrophysiologist: Faustino Congress Weight:does not weigh      Attempted call to patient and unable to reach.   Transmission reviewed.    Thoracic impedance abnormal suggesting fluid accumulation.  Prescribed dosage: Torsemide 20 mg 4 tablets (80 mg total) every AM and 3 tablets (60 mg total) every PM. Potassium 20 mEq 2 tablets (40 mEq total) daily.   Labs: 06/24/2017 Creatinine 1.29, BUN 17, Potassium 4.0, Sodium 136, EGFR >60 06/08/2017 Creatinine 1.59, BUN 21, Potassium 3.6, Sodium 134, EGFR >60 05/09/2017 Creatinine 1.59, BUN 24, Potassium 3.6, Sodium 131, EGFR 52->60 05/02/2017 Creatinine 1.35, BUN 14, Potassium 3.5, Sodium 136, EGFR >60 01/05/2017 Creatinine 1.46, BUN 18, Potassium 4.1, Sodium 135, EGFR 58->60 10/04/2016 Creatinine 1.42, BUN 11, Potassium 3.3, Sodium 138, EGFR >60 09/23/2016 Creatinine 1.28, BUN 16, Potassium 3.7, Sodium 136, EGFR >60  06/01/2016 Creatinine 1.34, BUN 6, Potassium 3.4, Sodium 139, EGFR >60 01/09/2016 Creatinine 1.26, BUN 15, Potassium 3.3, Sodium 138, EGFR 70-81 05/07/2015 Creatinine 1.30, BUN 18, Potassium 3.5, Sodium 139  Recommendations: NONE - Unable to reach.  Follow-up plan: ICM clinic phone appointment on 01/19/2018 to recheck fluid levels.  Due to reschedule missed appointment in January with HF clinic.  Needs to schedule appointment with Dr Caryl Comes (last appt was 2015) and message sent to scheduler to call patient to make an appointment.  Copy of ICM check sent to Dr. Haroldine Laws and Dr. Caryl Comes.   3 month ICM trend: 01/12/2018     1 Year ICM trend:       Rosalene Billings, RN 01/12/2018 9:40 AM

## 2018-01-16 ENCOUNTER — Encounter: Payer: Self-pay | Admitting: Internal Medicine

## 2018-01-19 ENCOUNTER — Ambulatory Visit (INDEPENDENT_AMBULATORY_CARE_PROVIDER_SITE_OTHER): Payer: Self-pay

## 2018-01-19 ENCOUNTER — Telehealth: Payer: Self-pay

## 2018-01-19 DIAGNOSIS — Z95810 Presence of automatic (implantable) cardiac defibrillator: Secondary | ICD-10-CM

## 2018-01-19 DIAGNOSIS — I5042 Chronic combined systolic (congestive) and diastolic (congestive) heart failure: Secondary | ICD-10-CM

## 2018-01-19 NOTE — Telephone Encounter (Signed)
LMOVM requesting that pt send manual transmission 

## 2018-01-20 ENCOUNTER — Telehealth: Payer: Self-pay

## 2018-01-20 NOTE — Progress Notes (Signed)
EPIC Encounter for ICM Monitoring  Patient Name: Douglas [name] is a 42 y.o. male Date: 01/20/2018 Primary Care Physican: Alphonzo Grieve, MD Primary Cardiologist:Bensimhon Electrophysiologist: Faustino Congress Weight:does not weigh        Attempted call to patient and unable to reach.  Left message to return call.  Transmission reviewed.    Thoracic impedance at baseline as of 01/19/2018 but has been abnormal suggesting fluid accumulation since December with exception of a few days at baseline.  Prescribed dosage: Torsemide 20 mg 4 tablets (80 mg total) every AM and 3 tablets (60 mg total) every PM. Potassium 20 mEq 2 tablets (40 mEq total) daily.   Labs: 06/24/2017 Creatinine 1.29, BUN 17, Potassium 4.0, Sodium 136, EGFR >60 06/08/2017 Creatinine 1.59, BUN 21, Potassium 3.6, Sodium 134, EGFR >60 05/09/2017 Creatinine 1.59, BUN 24, Potassium 3.6, Sodium 131, EGFR 52->60 05/02/2017 Creatinine 1.35, BUN 14, Potassium 3.5, Sodium 136, EGFR >60 01/05/2017 Creatinine 1.46, BUN 18, Potassium 4.1, Sodium 135, EGFR 58->60 10/04/2016 Creatinine 1.42, BUN 11, Potassium 3.3, Sodium 138, EGFR >60 09/23/2016 Creatinine 1.28, BUN 16, Potassium 3.7, Sodium 136, EGFR >60  06/01/2016 Creatinine 1.34, BUN 6, Potassium 3.4, Sodium 139, EGFR >60 01/09/2016 Creatinine 1.26, BUN 15, Potassium 3.3, Sodium 138, EGFR 70-81 05/07/2015 Creatinine 1.30, BUN 18, Potassium 3.5, Sodium 139  Recommendations: NONE - Unable to reach.  Follow-up plan: ICM clinic phone appointment on 02/14/2018.   Patient missed January HF clinic appointment and advised to reschedule.  He is due to schedule device appointment with Dr Caryl Comes (last appt was 2015) and scheduler attempted a call to patient.  Recall letter sent reminding patient he needs to schedule an appointment.  Copy of ICM check sent to Dr. Caryl Comes and Dr. Haroldine Laws.   3 month ICM trend: 01/19/2018    1 Year ICM trend:       Rosalene Billings,  RN 01/20/2018 7:58 AM

## 2018-01-20 NOTE — Telephone Encounter (Signed)
Remote ICM transmission received.  Attempted call to patient and left message to return call. 

## 2018-01-24 ENCOUNTER — Other Ambulatory Visit: Payer: Self-pay | Admitting: Internal Medicine

## 2018-01-24 ENCOUNTER — Other Ambulatory Visit (HOSPITAL_COMMUNITY): Payer: Self-pay | Admitting: Internal Medicine

## 2018-01-24 DIAGNOSIS — N182 Chronic kidney disease, stage 2 (mild): Principal | ICD-10-CM

## 2018-01-24 DIAGNOSIS — E1122 Type 2 diabetes mellitus with diabetic chronic kidney disease: Secondary | ICD-10-CM

## 2018-01-24 MED FILL — BUPROPION HCL XL 300 MG TAB: 300 | 90 days supply | Qty: 90 | Fill #0

## 2018-01-24 MED FILL — LANTUS SOLOSTAR 100 UNITS/M: 100 | 30 days supply | Qty: 6 | Fill #1

## 2018-01-24 MED FILL — TORSEMIDE 20 MG TABLET: 20 | 30 days supply | Qty: 210 | Fill #2

## 2018-01-24 MED FILL — POTASSIUM CL ER 20 MEQ TABL: 20 | 30 days supply | Qty: 60 | Fill #1

## 2018-01-25 MED FILL — ENTRESTO 97 MG-103 MG TAB: 97-103 | 30 days supply | Qty: 60 | Fill #2

## 2018-01-26 MED FILL — BIDIL TABLET: 20-37.5 | 30 days supply | Qty: 90 | Fill #0

## 2018-01-26 MED FILL — METFORMIN HCL ER 500 MG TAB: 500 | 30 days supply | Qty: 90 | Fill #0

## 2018-01-26 MED FILL — ATORVASTATIN 40 MG TABLET: 40 | 30 days supply | Qty: 30 | Fill #0

## 2018-01-27 NOTE — Telephone Encounter (Signed)
Confirmed with patient that he would be coming to appt on 01/30/2018 at 9:15. Kinnie Feil, RN, BSN

## 2018-01-30 ENCOUNTER — Ambulatory Visit: Payer: Medicaid Other

## 2018-01-31 ENCOUNTER — Encounter: Payer: Self-pay | Admitting: Internal Medicine

## 2018-02-01 ENCOUNTER — Telehealth (HOSPITAL_COMMUNITY): Payer: Self-pay | Admitting: Pharmacist

## 2018-02-01 NOTE — Telephone Encounter (Signed)
Entresto PA approved by [place] Medicaid through 01/20/19.   [name]. Bonnye Fava, PharmD, BCPS, CPP Clinical Pharmacist Phone: [phone] 02/01/2018 10:42 AM

## 2018-02-02 ENCOUNTER — Ambulatory Visit (INDEPENDENT_AMBULATORY_CARE_PROVIDER_SITE_OTHER): Payer: Medicaid Other | Admitting: *Deleted

## 2018-02-02 DIAGNOSIS — I472 Ventricular tachycardia, unspecified: Secondary | ICD-10-CM

## 2018-02-02 NOTE — Progress Notes (Signed)
Remote ICD transmission.   

## 2018-02-03 ENCOUNTER — Encounter: Payer: Self-pay | Admitting: Cardiology

## 2018-02-06 ENCOUNTER — Telehealth (HOSPITAL_COMMUNITY): Payer: Self-pay

## 2018-02-06 NOTE — Telephone Encounter (Signed)
CHF Clinic appointment reminder call placed to patient for upcoming post-hospital follow up.  Does understand purpose of this appointment and where CHF Clinic is located? Yes  How is patient feeling? Well, states he is up "a couple of lbs" but feels it is true weight and not water weight as he has no c/o swelling or SOB.  Does patient have all of their medications since their recent discharge? Yes  Patient also reminded to take all medications as prescribed on the day of his/her appointment and to bring all medications to this appointment.  Advised to call our office for tardiness or cancellations/rescheduling needs.  Elige Radon, Bettina Gavia

## 2018-02-07 ENCOUNTER — Encounter (HOSPITAL_COMMUNITY): Payer: Medicaid Other

## 2018-02-07 ENCOUNTER — Encounter: Payer: Self-pay | Admitting: Internal Medicine

## 2018-02-14 ENCOUNTER — Encounter: Payer: Self-pay | Admitting: Internal Medicine

## 2018-02-14 ENCOUNTER — Telehealth: Payer: Self-pay | Admitting: Cardiology

## 2018-02-14 NOTE — Telephone Encounter (Signed)
LMOVM reminding pt to send remote transmission.   

## 2018-02-20 ENCOUNTER — Encounter (HOSPITAL_COMMUNITY): Payer: Self-pay

## 2018-02-20 ENCOUNTER — Ambulatory Visit (HOSPITAL_COMMUNITY)
Admission: RE | Admit: 2018-02-20 | Discharge: 2018-02-20 | Disposition: A | Discharge status: Home / Self Care | Payer: Medicaid Other | Source: Ambulatory Visit | Attending: Internal Medicine | Admitting: Internal Medicine

## 2018-02-20 VITALS — BP 126/84 | HR 95 | Wt 331.0 lb

## 2018-02-20 DIAGNOSIS — I429 Cardiomyopathy, unspecified: Secondary | ICD-10-CM | POA: Insufficient documentation

## 2018-02-20 DIAGNOSIS — Z794 Long term (current) use of insulin: Secondary | ICD-10-CM | POA: Insufficient documentation

## 2018-02-20 DIAGNOSIS — Z720 Tobacco use: Secondary | ICD-10-CM

## 2018-02-20 DIAGNOSIS — F1490 Cocaine use, unspecified, uncomplicated: Secondary | ICD-10-CM | POA: Diagnosis not present

## 2018-02-20 DIAGNOSIS — I110 Hypertensive heart disease with heart failure: Secondary | ICD-10-CM | POA: Diagnosis not present

## 2018-02-20 DIAGNOSIS — F329 Major depressive disorder, single episode, unspecified: Secondary | ICD-10-CM | POA: Diagnosis not present

## 2018-02-20 DIAGNOSIS — F17210 Nicotine dependence, cigarettes, uncomplicated: Secondary | ICD-10-CM | POA: Diagnosis not present

## 2018-02-20 DIAGNOSIS — I472 Ventricular tachycardia, unspecified: Secondary | ICD-10-CM

## 2018-02-20 DIAGNOSIS — Z79899 Other long term (current) drug therapy: Secondary | ICD-10-CM | POA: Diagnosis not present

## 2018-02-20 DIAGNOSIS — Z95811 Presence of heart assist device: Secondary | ICD-10-CM | POA: Insufficient documentation

## 2018-02-20 DIAGNOSIS — I5042 Chronic combined systolic (congestive) and diastolic (congestive) heart failure: Secondary | ICD-10-CM | POA: Diagnosis not present

## 2018-02-20 DIAGNOSIS — Z9119 Patient's noncompliance with other medical treatment and regimen: Secondary | ICD-10-CM | POA: Insufficient documentation

## 2018-02-20 DIAGNOSIS — Z880 Allergy status to penicillin: Secondary | ICD-10-CM | POA: Diagnosis not present

## 2018-02-20 DIAGNOSIS — I5022 Chronic systolic (congestive) heart failure: Secondary | ICD-10-CM | POA: Diagnosis present

## 2018-02-20 DIAGNOSIS — Z7982 Long term (current) use of aspirin: Secondary | ICD-10-CM | POA: Insufficient documentation

## 2018-02-20 DIAGNOSIS — E669 Obesity, unspecified: Secondary | ICD-10-CM | POA: Diagnosis not present

## 2018-02-20 DIAGNOSIS — Z4502 Encounter for adjustment and management of automatic implantable cardiac defibrillator: Secondary | ICD-10-CM | POA: Diagnosis not present

## 2018-02-20 DIAGNOSIS — E119 Type 2 diabetes mellitus without complications: Secondary | ICD-10-CM | POA: Insufficient documentation

## 2018-02-20 LAB — COMPREHENSIVE METABOLIC PANEL
ALBUMIN: 3.5 g/dL (ref 3.5–5.0)
ALK PHOS: 78 U/L (ref 38–126)
ALT: 19 U/L (ref 17–63)
ANION GAP: 12 (ref 5–15)
AST: 23 U/L (ref 15–41)
BUN: 19 mg/dL (ref 6–20)
CALCIUM: 8.9 mg/dL (ref 8.9–10.3)
CO2: 24 mmol/L (ref 22–32)
Chloride: 102 mmol/L (ref 101–111)
Creatinine, Ser: 1.29 mg/dL — ABNORMAL HIGH (ref 0.61–1.24)
GFR calc Af Amer: >60 mL/min (ref >60)
GFR calc non Af Amer: >60 mL/min (ref >60)
GLUCOSE: 134 mg/dL — AB (ref 65–99)
Potassium: 3.3 mmol/L — ABNORMAL LOW (ref 3.5–5.1)
SODIUM: 138 mmol/L (ref 135–145)
Total Bilirubin: 0.5 mg/dL (ref 0.3–1.2)
Total Protein: 7.2 g/dL (ref 6.5–8.1)

## 2018-02-20 LAB — TSH: TSH: 0.647 u[IU]/mL (ref 0.350–4.500)

## 2018-02-20 LAB — BRAIN NATRIURETIC PEPTIDE: B Natriuretic Peptide: 133.8 pg/mL — ABNORMAL HIGH (ref 0.0–100.0)

## 2018-02-20 NOTE — Patient Instructions (Addendum)
Routine lab work today. Will notify you of abnormal results, otherwise no news is good news!  Follow up 3 months with Amy Clegg NP-C.  _________________________________________________________ [place] Code:  1400  Take all medication as prescribed the day of your appointment. Bring all medications with you to your appointment.  Do the following things EVERYDAY: 1) Weigh yourself in the morning before breakfast. Write it down and keep it in a log. 2) Take your medicines as prescribed 3) Eat low salt foods-Limit salt (sodium) to 2000 mg per day.  4) Stay as active as you can everyday 5) Limit all fluids for the day to less than 2 liters

## 2018-02-20 NOTE — Progress Notes (Signed)
Patient ID: Douglas [name], male   DOB: June 01, 1976, 42 y.o.   MRN: [id number]  EP: Dr Graciela Husbands PCP: Dr. Johna Roles Primary Cardiologist: Dr. [name]  History of Present Illness: Douglas [name] is a 42 y.o. male with a h/o HTN, HL, obesity, NICM and chronic systolic heart failure. He is S/P Medtronic ICD due to NICM. Management has been complicated by severe non-compliance.   Underwent cath in 2006 which showed normal cors with EF 20-25%.   EF had recovered in late 2013 but has again fallen to 15% per echo 01/20/13.  He was admitted to [facility] 02/21/13 from clinic for low output symptoms and volume overload.  He required IV lasix and metolazone.  He diuresed 19 pounds with discharge weight of 254 pounds.  He had runs of VT therefore he was started on amiodarone per EP and Lifevest was placed at discharge.  He underwent RHC on 02/22/13.  He has not required metolazone.   He was admitted in 1/16 with VT and ICD shock x 2.   Today he returns for HF follow up. He has been seen over the last 5 months. Says he has been under a lot of stress because his wife died about 4 months ago. Overall feeling fine. Denies SOB/PND/Orthopnea. Appetite ok. No fever or chills. He has not been weighing at home. Smoking 1 pack a cigarettes a week. Started using cocaine again. Taking all medications.    RHC 02/22/13  RA = 12  RV = 49/6/15  PA = 54/29 (41)  PCW = 28 (v= 40)  Fick cardiac output/index = 4.3/1.7  PVR = 2.6 Woods  SVR = 1580 dynes  FA sat = 98%  PA sat = 53%, 54%  Non-invasive BP = 124/86 (97)  RHC 05/03/13 RA = 2  RV = 29/2/2  PA = 26/10 (17)  PCW = 3  Fick cardiac output/index = 6.1/2.3  PVR = 2.3  FA sat = 93%  PA sat = 65%, 67%  ECHO 06/25/13 EF 20-25% ECHO 05/01/14 EF 35-40%, moderately dilated LV, mild LVH, diffuse hypokinesis Echo 6/16 EF 25-30%, mild LVH, moderately dilated LV.  ECHO 09/23/2016 EF 25-30%. 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 Rare ETOH, smokes rarely. Remote cocaine.  FH: Mom has CAD.    ROS:  Please see the history of present illness.  He denies fevers, chills, melena, hematochezia.  All other systems reviewed and negative.  Past Medical History:  Diagnosis Date  . Depression, major, single episode 01/14/2017  . Diabetes mellitus (HCC)   . ED (erectile dysfunction)   . History of syncope 01/29/2015  . Hypertension   . 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 Asymptomatic.   Marland Kitchen Nonischemic cardiomyopathy (HCC)    a.  echo 4/06: EF 30%, mild to mod MR, mild RAE, inf HK, lat HK , ant HK;    b.  cath 4/06: no CAD, EF 20-25%  . NSVT (nonsustained ventricular tachycardia) (HCC)   . Obesity   . Systolic CHF, chronic (HCC)    EF 11/17 25-30%, s/p ICD    Current Outpatient Medications  Medication Sig Dispense Refill  . amiodarone (PACERONE) 200 MG tablet Take 1 tablet (200 mg total) by mouth daily. 30 tablet 5  . aspirin EC 81 MG EC tablet Take 1 tablet (81 mg total) by mouth daily. 30 tablet 0  . atorvastatin (LIPITOR) 40 MG tablet TAKE  1 TABLET BY MOUTH ONCE DAILY 30 tablet 2  . BIDIL 20-37.5 MG tablet TAKE 1 TABLET BY MOUTH 3 TIMES DAILY. 90 tablet 3  . buPROPion (WELLBUTRIN XL) 300 MG 24 hr tablet Take 1 tablet (300 mg total) by mouth daily. 90 tablet 1  . carvedilol (COREG) 12.5 MG tablet TAKE 1 TABLET BY MOUTH 2 TIMES DAILY WITH A MEAL. 60 tablet 2  . glucose blood test strip The patient is insulin requiring, ICD 10 code E11.9. The patient tests 3 times per day. 100 each 12  . Insulin Pen Needle (BD PEN NEEDLE NANO U/F) 32G X 4 MM MISC Inject 1 application into the skin at bedtime. The patient is insulin requiring, ICD 10 code E11.9. The patient injects 1 times per day. 100 each 3  . Lancets 30G MISC 1 applicator by Does not apply route 4 (four) times daily. The patient is insulin requiring, ICD 10 code E11.9.The patient tests 4x per day. 100 each 5  . LANTUS  SOLOSTAR 100 UNIT/ML Solostar Pen INJECT 20 UNITS INTO THE SKIN DAILY AT 10 PM. 15 mL 1  . metFORMIN (GLUCOPHAGE-XR) 500 MG 24 hr tablet Take 2 tablets by mouth with breakfast, and 1 tablet by mouth every with dinner. 180 tablet 0  . metolazone (ZAROXOLYN) 5 MG tablet Take only as directed by CHF clinic [phone]) (Patient not taking: Reported on 06/17/2017) 10 tablet 0  . potassium chloride SA (K-DUR,KLOR-CON) 20 MEQ tablet TAKE 2 TABLETS BY MOUTH DAILY. 60 tablet 2  . sacubitril-valsartan (ENTRESTO) 97-103 MG Take 1 tablet by mouth 2 (two) times daily. 60 tablet 3  . spironolactone (ALDACTONE) 25 MG tablet Take 1 tablet (25 mg total) by mouth at bedtime. 90 tablet 3  . torsemide (DEMADEX) 20 MG tablet TAKE 4 TABLETS BY MOUTH IN THE MORNING AND 3 TABLETS IN EVENING 210 tablet 3   No current facility-administered medications for this encounter.     Allergies  Allergen Reactions  . Bydureon [Exenatide] Rash  . Penicillins Other (See Comments)    Unknown childhood reaction     Vitals:   02/20/18 1430  BP: 126/84  Pulse: 95  SpO2: 98%   Wt Readings from Last 3 Encounters:  02/20/18 (!) 331 lb (150.1 kg)  08/19/17 (!) 343 lb 8 oz (155.8 kg)  08/10/17 (!) 340 lb (154.2 kg)   PHYSICAL EXAM: General:  Well appearing. No resp difficulty HEENT: normal Neck: supple. no JVD. Carotids 2+ bilat; no bruits. No lymphadenopathy or thryomegaly appreciated. Cor: PMI nondisplaced. Regular rate & rhythm. No rubs, gallops or murmurs. Lungs: clear Abdomen: obese, soft, nontender, nondistended. No hepatosplenomegaly. No bruits or masses. Good bowel sounds. Extremities: no cyanosis, clubbing, rash, edema Neuro: alert & orientedx3, cranial nerves grossly intact. moves all 4 extremities w/o difficulty. Affect pleasant  1) Chronic systolic HF: Nonischemic cardiomyopathy. Echo 09/2016 with EF 25-30%, RV systolic function mildly reduced, ECHO 08/19/2017 EF 40-45%.  S/P Medtronic ICD  - Continue  torsemide 80mg /60mg .  - Continue Coreg 12.5 mg BID -Continue Spiro to 25 mg daily.  - Continue Bidil TID -Continue entresto to 97/103 twice a day.  - Check BMET today.  2) HTN:  . Stable.  3) VT:  - Continue amio 200 mg daily.  . - Check TSH/LFTs  4) Cocaine abuse:   Started using again. Discussed cessation.  5) Tobacco Abuse - Discussed smoking cessation.  6) DMII- on insulin.   Follow up in 3-4 months. Referred to HFSW for coping strategies.  Tonye Becket, NP  02/20/2018

## 2018-02-20 NOTE — Addendum Note (Signed)
Encounter addended by: Marcy Siren, LCSW on: 02/20/2018 4:25 PM  Actions taken: Sign clinical note

## 2018-02-20 NOTE — Progress Notes (Signed)
CSW met with patient in the clinic. Patient shared multiple social stressors with the main one the loss of his wife. Patient reports his wife passed away from complications of Diabetes. Patient is working but struggles financially. CSW provided supportive intervention and gave patient some bus passes to get home and to work this week. Patient appreciative of visit and support. Patient will call if further needs arise. Raquel Sarna, [place], [place]

## 2018-02-24 LAB — CUP PACEART REMOTE DEVICE CHECK
Battery Remaining Longevity: 93 mo
Brady Statistic RV Percent Paced: 0.03 %
HIGH POWER IMPEDANCE MEASURED VALUE: 50 Ohm
HIGH POWER IMPEDANCE MEASURED VALUE: 69 Ohm
Implantable Lead Implant Date: 20141009
Implantable Lead Model: 7121
Lead Channel Impedance Value: 323 Ohm
Lead Channel Pacing Threshold Amplitude: 0.875 V
Lead Channel Sensing Intrinsic Amplitude: 11.125 mV
Lead Channel Setting Pacing Pulse Width: 0.4 ms
MDC IDC LEAD LOCATION: 753860
MDC IDC MSMT BATTERY VOLTAGE: 3 V
MDC IDC MSMT LEADCHNL RV IMPEDANCE VALUE: 437 Ohm
MDC IDC MSMT LEADCHNL RV PACING THRESHOLD PULSEWIDTH: 0.4 ms
MDC IDC MSMT LEADCHNL RV SENSING INTR AMPL: 11.125 mV
MDC IDC PG IMPLANT DT: 20141009
MDC IDC SESS DTM: 20190321052303
MDC IDC SET LEADCHNL RV PACING AMPLITUDE: 2.5 V
MDC IDC SET LEADCHNL RV SENSING SENSITIVITY: 0.45 mV

## 2018-02-28 ENCOUNTER — Encounter (HOSPITAL_COMMUNITY): Payer: Self-pay | Admitting: Cardiology

## 2018-03-03 NOTE — Progress Notes (Signed)
No ICM remote transmission received for 02/14/2018 and next ICM transmission scheduled for 03/20/2018.

## 2018-03-10 MED FILL — TORSEMIDE 10 MG TABLET: 10 | 30 days supply | Qty: 420 | Fill #0

## 2018-03-10 MED FILL — AMIODARONE HCL 200 MG TAB: 200 | 30 days supply | Qty: 30 | Fill #4

## 2018-03-10 MED FILL — ENTRESTO 97 MG-103 MG TAB: 97-103 | 30 days supply | Qty: 60 | Fill #3

## 2018-03-10 MED FILL — CARVEDILOL 12.5 MG TABS: 12.5 | 30 days supply | Qty: 60 | Fill #2

## 2018-03-14 ENCOUNTER — Telehealth (HOSPITAL_COMMUNITY): Payer: Self-pay | Admitting: *Deleted

## 2018-03-14 DIAGNOSIS — E1122 Type 2 diabetes mellitus with diabetic chronic kidney disease: Secondary | ICD-10-CM

## 2018-03-14 DIAGNOSIS — N182 Chronic kidney disease, stage 2 (mild): Principal | ICD-10-CM

## 2018-03-14 MED ORDER — POTASSIUM CHLORIDE CRYS ER 20 MEQ PO TBCR: 40.0000 meq | EXTENDED_RELEASE_TABLET | Freq: Two times a day (BID) | ORAL | 2 refills | Status: DC

## 2018-03-14 MED FILL — POTASSIUM CL ER 20 MEQ TABL: 20 | 30 days supply | Qty: 120 | Fill #0

## 2018-03-14 NOTE — Telephone Encounter (Signed)
Pt states his was turned off for a while and he just received a letter from our office to call about results.  We had been trying to reach pt for lab results:  Notes recorded by Tonye Becket D, NP on 02/21/2018 at 5:07 PM EDT Increase potassium 40 meq twice a day  Pt is aware and agreeable, new rx sent to pharmacy

## 2018-03-20 ENCOUNTER — Telehealth: Payer: Self-pay

## 2018-03-20 NOTE — Telephone Encounter (Signed)
LMOVM reminding pt to send remote transmission.   

## 2018-03-24 NOTE — Progress Notes (Signed)
No ICM remote transmission received for 03/20/2018 and next ICM transmission scheduled for 04/13/2018.    

## 2018-04-20 NOTE — Progress Notes (Signed)
No ICM remote transmission received for 04/13/2018 and next ICM transmission scheduled for 05/04/2018.    

## 2018-04-24 ENCOUNTER — Telehealth: Payer: Self-pay | Admitting: Licensed Clinical Social Worker

## 2018-04-24 NOTE — Telephone Encounter (Signed)
CSW received call from patient requesting assistance with finding a PCP. Patient states he has tried to reach IM clinic with no success. Patient has had 3 no shows in the past and a note in the chart reflects patient no longer a patient through the IM clinic. CSW advised patient to reach out to [facility] caseworker to be re assigned to new PCP. CSW provided patient with some options for PCP in the community. Patient grateful for the assistance and will return call to CSW if needed. Lasandra Beech, LCSW, CCSW-MCS [phone]

## 2018-05-04 ENCOUNTER — Encounter: Payer: Medicaid Other | Admitting: *Deleted

## 2018-05-04 ENCOUNTER — Telehealth: Payer: Self-pay | Admitting: Cardiology

## 2018-05-04 NOTE — Telephone Encounter (Signed)
LMOVM reminding pt to send remote transmission.   

## 2018-05-05 ENCOUNTER — Encounter: Payer: Self-pay | Admitting: Cardiology

## 2018-05-12 NOTE — Progress Notes (Signed)
No ICM remote transmission received for 05/04/2018 and next ICM transmission scheduled for 05/29/2018.

## 2018-05-15 ENCOUNTER — Other Ambulatory Visit (HOSPITAL_COMMUNITY): Payer: Self-pay | Admitting: Adult Health

## 2018-05-15 ENCOUNTER — Encounter (HOSPITAL_COMMUNITY): Payer: Self-pay

## 2018-05-15 ENCOUNTER — Ambulatory Visit (HOSPITAL_COMMUNITY)
Admission: RE | Admit: 2018-05-15 | Discharge: 2018-05-15 | Disposition: A | Discharge status: Home / Self Care | Payer: Medicaid Other | Source: Ambulatory Visit | Attending: Internal Medicine | Admitting: Internal Medicine

## 2018-05-15 ENCOUNTER — Other Ambulatory Visit (HOSPITAL_COMMUNITY): Payer: Self-pay | Admitting: Internal Medicine

## 2018-05-15 VITALS — BP 148/106 | HR 80 | Wt 332.0 lb

## 2018-05-15 DIAGNOSIS — I5022 Chronic systolic (congestive) heart failure: Secondary | ICD-10-CM | POA: Diagnosis not present

## 2018-05-15 DIAGNOSIS — I5042 Chronic combined systolic (congestive) and diastolic (congestive) heart failure: Secondary | ICD-10-CM | POA: Diagnosis not present

## 2018-05-15 DIAGNOSIS — F329 Major depressive disorder, single episode, unspecified: Secondary | ICD-10-CM | POA: Insufficient documentation

## 2018-05-15 DIAGNOSIS — R0602 Shortness of breath: Secondary | ICD-10-CM | POA: Insufficient documentation

## 2018-05-15 DIAGNOSIS — E669 Obesity, unspecified: Secondary | ICD-10-CM | POA: Diagnosis not present

## 2018-05-15 DIAGNOSIS — I429 Cardiomyopathy, unspecified: Secondary | ICD-10-CM | POA: Insufficient documentation

## 2018-05-15 DIAGNOSIS — Z9119 Patient's noncompliance with other medical treatment and regimen: Secondary | ICD-10-CM | POA: Insufficient documentation

## 2018-05-15 DIAGNOSIS — E119 Type 2 diabetes mellitus without complications: Secondary | ICD-10-CM | POA: Diagnosis not present

## 2018-05-15 DIAGNOSIS — Z720 Tobacco use: Secondary | ICD-10-CM

## 2018-05-15 DIAGNOSIS — I428 Other cardiomyopathies: Secondary | ICD-10-CM | POA: Diagnosis not present

## 2018-05-15 DIAGNOSIS — Z888 Allergy status to other drugs, medicaments and biological substances status: Secondary | ICD-10-CM | POA: Diagnosis not present

## 2018-05-15 DIAGNOSIS — F1490 Cocaine use, unspecified, uncomplicated: Secondary | ICD-10-CM

## 2018-05-15 DIAGNOSIS — Z880 Allergy status to penicillin: Secondary | ICD-10-CM | POA: Diagnosis not present

## 2018-05-15 DIAGNOSIS — Z79899 Other long term (current) drug therapy: Secondary | ICD-10-CM | POA: Insufficient documentation

## 2018-05-15 DIAGNOSIS — I10 Essential (primary) hypertension: Secondary | ICD-10-CM | POA: Diagnosis not present

## 2018-05-15 DIAGNOSIS — Z7982 Long term (current) use of aspirin: Secondary | ICD-10-CM | POA: Diagnosis not present

## 2018-05-15 DIAGNOSIS — Z95810 Presence of automatic (implantable) cardiac defibrillator: Secondary | ICD-10-CM | POA: Diagnosis not present

## 2018-05-15 DIAGNOSIS — I110 Hypertensive heart disease with heart failure: Secondary | ICD-10-CM | POA: Insufficient documentation

## 2018-05-15 DIAGNOSIS — Z794 Long term (current) use of insulin: Secondary | ICD-10-CM | POA: Insufficient documentation

## 2018-05-15 DIAGNOSIS — F1410 Cocaine abuse, uncomplicated: Secondary | ICD-10-CM | POA: Diagnosis not present

## 2018-05-15 DIAGNOSIS — Z8249 Family history of ischemic heart disease and other diseases of the circulatory system: Secondary | ICD-10-CM | POA: Diagnosis not present

## 2018-05-15 LAB — BASIC METABOLIC PANEL
ANION GAP: 8 (ref 5–15)
BUN: 12 mg/dL (ref 6–20)
CALCIUM: 9 mg/dL (ref 8.9–10.3)
CO2: 24 mmol/L (ref 22–32)
CREATININE: 1.24 mg/dL (ref 0.61–1.24)
Chloride: 108 mmol/L (ref 98–111)
GFR calc Af Amer: >60 mL/min (ref >60)
Glucose, Bld: 159 mg/dL — ABNORMAL HIGH (ref 70–99)
Potassium: 4 mmol/L (ref 3.5–5.1)
SODIUM: 140 mmol/L (ref 135–145)

## 2018-05-15 MED ORDER — CARVEDILOL 12.5 MG PO TABS: 18.7500 mg | ORAL_TABLET | Freq: Two times a day (BID) | ORAL | 11 refills | Status: DC

## 2018-05-15 MED FILL — METFORMIN HCL ER 500 MG TAB: 500 | 30 days supply | Qty: 90 | Fill #1

## 2018-05-15 MED FILL — TORSEMIDE 10 MG TABLET: 10 | 30 days supply | Qty: 420 | Fill #0

## 2018-05-15 MED FILL — ENTRESTO 97 MG-103 MG TAB: 97-103 | 30 days supply | Qty: 60 | Fill #0

## 2018-05-15 MED FILL — CARVEDILOL 12.5 MG TABLET: 12.5 | 30 days supply | Qty: 60 | Fill #0

## 2018-05-15 MED FILL — LANTUS SOLOSTAR 100 UNITS/M: 100 | 30 days supply | Qty: 6 | Fill #2

## 2018-05-15 NOTE — Progress Notes (Signed)
Patient ID: Douglas [name], male   DOB: [birth year], 42 y.o.   MRN: [id number]  EP: Dr Graciela Husbands PCP: Dr. Johna Roles Primary Cardiologist: Dr. [name]  History of Present Illness: Douglas [name] is a 42 y.o. male with a h/o HTN, HL, obesity, smoker, cocaine abuse,  NICM and chronic systolic heart failure. He is S/P Medtronic ICD due to NICM. Management has been complicated by severe non-compliance.   Underwent cath in 2006 which showed normal cors with EF 20-25%.   EF had recovered in late 2013 but has again fallen to 15% per echo 01/20/13.  He was admitted to [facility] 02/21/13 from clinic for low output symptoms and volume overload.  He required IV lasix and metolazone.  He diuresed 19 pounds with discharge weight of 254 pounds.  He had runs of VT therefore he was started on amiodarone per EP and Lifevest was placed at discharge.  He underwent RHC on 02/22/13.  He has not required metolazone.   He was admitted in 1/16 with VT and ICD shock x 2.   Today he returns for HF follow up. Overall feeling fine. Denies PND/Orthopnea. SOB with steps. Appetite ok. No fever or chills. Not weighing at home.Taking all medications. Smoking 1 pack of cigarettes per week. Drink 40 ounces of beer a week. Using cocaine every now and then.    Optivol- Activity ~4 hours per day. Fluid below threshold. No VT/AFib  RHC 02/22/13  RA = 12  RV = 49/6/15  PA = 54/29 (41)  PCW = 28 (v= 40)  Fick cardiac output/index = 4.3/1.7  PVR = 2.6 Woods  SVR = 1580 dynes  FA sat = 98%  PA sat = 53%, 54%  Non-invasive BP = 124/86 (97)  RHC 05/03/13 RA = 2  RV = 29/2/2  PA = 26/10 (17)  PCW = 3  Fick cardiac output/index = 6.1/2.3  PVR = 2.3  FA sat = 93%  PA sat = 65%, 67%  ECHO 06/25/13 EF 20-25% ECHO 05/01/14 EF 35-40%, moderately dilated LV, mild LVH, diffuse hypokinesis Echo 6/16 EF 25-30%, mild LVH, moderately dilated LV.  ECHO 09/23/2016 EF 25-30%. 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 Rare ETOH, smokes rarely. Remote cocaine.  FH: Mom has CAD.    ROS:  Please see the history of present illness.  He denies fevers, chills, melena, hematochezia.  All other systems reviewed and negative.  Past Medical History:  Diagnosis Date  . Depression, major, single episode 01/14/2017  . Diabetes mellitus (HCC)   . ED (erectile dysfunction)   . History of syncope 01/29/2015  . Hypertension   . 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 Asymptomatic.   Marland Kitchen Nonischemic cardiomyopathy (HCC)    a.  echo 4/06: EF 30%, mild to mod MR, mild RAE, inf HK, lat HK , ant HK;    b.  cath 4/06: no CAD, EF 20-25%  . NSVT (nonsustained ventricular tachycardia) (HCC)   . Obesity   . Systolic CHF, chronic (HCC)    EF 11/17 25-30%, s/p ICD    Current Outpatient Medications  Medication Sig Dispense Refill  . amiodarone (PACERONE) 200 MG tablet Take 1 tablet (200 mg total) by mouth daily. 30 tablet 5  . aspirin EC 81 MG EC tablet Take 1 tablet (81 mg total) by mouth daily. 30 tablet 0  . atorvastatin (LIPITOR) 40 MG tablet TAKE 1 TABLET  BY MOUTH ONCE DAILY 30 tablet 2  . BIDIL 20-37.5 MG tablet TAKE 1 TABLET BY MOUTH 3 TIMES DAILY. 90 tablet 3  . buPROPion (WELLBUTRIN XL) 300 MG 24 hr tablet Take 1 tablet (300 mg total) by mouth daily. 90 tablet 1  . carvedilol (COREG) 12.5 MG tablet TAKE 1 TABLET BY MOUTH 2 TIMES DAILY WITH A MEAL. 60 tablet 11  . ENTRESTO 97-103 MG TAKE 1 TABLET BY MOUTH TWICE DAILY 60 tablet 11  . glucose blood test strip The patient is insulin requiring, ICD 10 code E11.9. The patient tests 3 times per day. 100 each 12  . Insulin Pen Needle (BD PEN NEEDLE NANO U/F) 32G X 4 MM MISC Inject 1 application into the skin at bedtime. The patient is insulin requiring, ICD 10 code E11.9. The patient injects 1 times per day. 100 each 3  . Lancets 30G MISC 1 applicator by Does not apply route 4 (four) times daily. The patient is insulin  requiring, ICD 10 code E11.9.The patient tests 4x per day. 100 each 5  . LANTUS SOLOSTAR 100 UNIT/ML Solostar Pen INJECT 20 UNITS INTO THE SKIN DAILY AT 10 PM. 15 mL 1  . metFORMIN (GLUCOPHAGE-XR) 500 MG 24 hr tablet Take 2 tablets by mouth with breakfast, and 1 tablet by mouth every with dinner. 180 tablet 0  . potassium chloride SA (K-DUR,KLOR-CON) 20 MEQ tablet Take 2 tablets (40 mEq total) by mouth 2 (two) times daily. 120 tablet 2  . spironolactone (ALDACTONE) 25 MG tablet Take 1 tablet (25 mg total) by mouth at bedtime. 90 tablet 3  . torsemide (DEMADEX) 10 MG tablet TAKE 8 TABLETS BY MOUTH IN THE MORNING AND 6 TABLETS IN EVENING 420 tablet 3  . metolazone (ZAROXOLYN) 5 MG tablet Take only as directed by CHF clinic [phone]) (Patient not taking: Reported on 05/15/2018) 10 tablet 0   No current facility-administered medications for this encounter.     Allergies  Allergen Reactions  . Bydureon [Exenatide] Rash  . Penicillins Other (See Comments)    Unknown childhood reaction     Vitals:   05/15/18 1415  BP: (!) 148/106  Pulse: 80  SpO2: 97%  Weight: (!) 332 lb (150.6 kg)   Wt Readings from Last 3 Encounters:  05/15/18 (!) 332 lb (150.6 kg)  02/20/18 (!) 331 lb (150.1 kg)  08/19/17 (!) 343 lb 8 oz (155.8 kg)   PHYSICAL EXAM: General:  Well appearing. No resp difficulty HEENT: normal Neck: supple. JVP 7-8. Carotids 2+ bilat; no bruits. No lymphadenopathy or thryomegaly appreciated. Cor: PMI nondisplaced. Regular rate & rhythm. No rubs, gallops or murmurs. Lungs: clear Abdomen: obese, soft, nontender, nondistended. No hepatosplenomegaly. No bruits or masses. Good bowel sounds. Extremities: no cyanosis, clubbing, rash, edema Neuro: alert & orientedx3, cranial nerves grossly intact. moves all 4 extremities w/o difficulty. Affect pleasant  1) Chronic systolic HF: Nonischemic cardiomyopathy. Medtronic ICD  Echo 09/2016 with EF 25-30%, RV systolic function mildly reduced,  ECHO 08/19/2017 EF 40-45%. -NYHA II-III  - Continue torsemide 80mg /60mg .  - Increase carvediol 18.75 mg twice a day.  -Continue Spiro to 25 mg daily.  - Continue Bidil TID -Continue entresto to 97/103 twice a day.  -Check BMET  2) HTN:  Elevated. Increase carvedilol as above.  3) VT:  - Continue amio 200 mg daily.  . - TSH/LFTs on 02/2018  4) Cocaine abuse:   Continues to intermittently. Discussed cessation.  5) Tobacco Abuse -Discussed smoking cessation .  6) DMII-  on insulin.    Follow up 3-4 months.  Greater than 50% of the 25 minute visit was spent in counseling/coordination of care regarding disease state education, salt/fluid restriction, sliding scale diuretics, and medication compliance.   Douglas Becket, NP  05/15/2018

## 2018-05-15 NOTE — Patient Instructions (Signed)
Labs today (will call for abnormal results, otherwise no news is good news)  INCREASE Carvedilol 18.75 mg (1.5 Tablet) Twice Daily.  Follow up in 3-4 months.

## 2018-05-29 ENCOUNTER — Telehealth: Payer: Self-pay

## 2018-05-29 NOTE — Telephone Encounter (Signed)
LMOVM reminding pt to send remote transmission.   

## 2018-06-02 NOTE — Progress Notes (Signed)
No ICM remote transmission received for 05/29/2018 and next ICM transmission scheduled for 06/22/2018.    

## 2018-06-22 ENCOUNTER — Telehealth: Payer: Self-pay

## 2018-06-22 NOTE — Telephone Encounter (Signed)
Spoke with pt and reminded pt of remote transmission that is due today. Pt verbalized understanding.   

## 2018-06-23 NOTE — Progress Notes (Signed)
Unable to reach patient since December 2018 for ICM follow up.  Device clinic will continue to follow every 3 months.  No further ICM calls.

## 2018-06-29 ENCOUNTER — Other Ambulatory Visit: Payer: Self-pay | Admitting: Internal Medicine

## 2018-06-29 ENCOUNTER — Other Ambulatory Visit (HOSPITAL_COMMUNITY): Payer: Self-pay | Admitting: Internal Medicine

## 2018-06-29 DIAGNOSIS — N182 Chronic kidney disease, stage 2 (mild): Principal | ICD-10-CM

## 2018-06-29 DIAGNOSIS — E1122 Type 2 diabetes mellitus with diabetic chronic kidney disease: Secondary | ICD-10-CM

## 2018-06-29 MED ORDER — TORSEMIDE 20 MG PO TABS: ORAL_TABLET | ORAL | 3 refills | Status: DC

## 2018-06-29 MED ORDER — TORSEMIDE 20 MG PO TABS: ORAL_TABLET | ORAL | 1 refills | Status: DC

## 2018-06-29 MED FILL — LANTUS SOLOSTAR 100 UNITS/M: 100 | 30 days supply | Qty: 6 | Fill #3

## 2018-06-29 MED FILL — TORSEMIDE 20 MG TABLET: 20 | 30 days supply | Qty: 210 | Fill #0

## 2018-06-29 MED FILL — POTASSIUM CL ER 20 MEQ TABL: 20 | 30 days supply | Qty: 120 | Fill #1

## 2018-06-29 MED FILL — BIDIL TABLET: 20-37.5 | 30 days supply | Qty: 90 | Fill #1

## 2018-06-29 MED FILL — ENTRESTO 97 MG-103 MG TAB: 97-103 | 30 days supply | Qty: 60 | Fill #1

## 2018-06-29 MED FILL — ATORVASTATIN 40 MG TABLET: 40 | 30 days supply | Qty: 30 | Fill #1

## 2018-06-29 MED FILL — CARVEDILOL 12.5 MG TABLET: 12.5 | 30 days supply | Qty: 60 | Fill #1

## 2018-06-30 ENCOUNTER — Other Ambulatory Visit (HOSPITAL_COMMUNITY): Payer: Self-pay

## 2018-06-30 DIAGNOSIS — I10 Essential (primary) hypertension: Secondary | ICD-10-CM

## 2018-06-30 MED ORDER — SPIRONOLACTONE 25 MG PO TABS: 25.0000 mg | ORAL_TABLET | Freq: Every day | ORAL | 3 refills | Status: DC

## 2018-07-05 ENCOUNTER — Encounter (HOSPITAL_COMMUNITY): Payer: Self-pay | Admitting: Emergency Medicine

## 2018-07-05 ENCOUNTER — Emergency Department (HOSPITAL_COMMUNITY): Payer: Medicaid Other

## 2018-07-05 ENCOUNTER — Other Ambulatory Visit: Payer: Self-pay

## 2018-07-05 ENCOUNTER — Emergency Department (HOSPITAL_COMMUNITY)
Admission: EM | Admit: 2018-07-05 | Discharge: 2018-07-05 | Disposition: A | Discharge status: Home / Self Care | Payer: Medicaid Other | Attending: Emergency Medicine | Admitting: Emergency Medicine

## 2018-07-05 DIAGNOSIS — F17210 Nicotine dependence, cigarettes, uncomplicated: Secondary | ICD-10-CM | POA: Diagnosis not present

## 2018-07-05 DIAGNOSIS — Z794 Long term (current) use of insulin: Secondary | ICD-10-CM | POA: Insufficient documentation

## 2018-07-05 DIAGNOSIS — N182 Chronic kidney disease, stage 2 (mild): Secondary | ICD-10-CM | POA: Diagnosis not present

## 2018-07-05 DIAGNOSIS — Z79899 Other long term (current) drug therapy: Secondary | ICD-10-CM | POA: Diagnosis not present

## 2018-07-05 DIAGNOSIS — Z7982 Long term (current) use of aspirin: Secondary | ICD-10-CM | POA: Insufficient documentation

## 2018-07-05 DIAGNOSIS — I130 Hypertensive heart and chronic kidney disease with heart failure and stage 1 through stage 4 chronic kidney disease, or unspecified chronic kidney disease: Secondary | ICD-10-CM | POA: Diagnosis not present

## 2018-07-05 DIAGNOSIS — E1122 Type 2 diabetes mellitus with diabetic chronic kidney disease: Secondary | ICD-10-CM | POA: Diagnosis not present

## 2018-07-05 DIAGNOSIS — M25462 Effusion, left knee: Secondary | ICD-10-CM | POA: Diagnosis not present

## 2018-07-05 DIAGNOSIS — M25562 Pain in left knee: Secondary | ICD-10-CM | POA: Diagnosis not present

## 2018-07-05 DIAGNOSIS — I5042 Chronic combined systolic (congestive) and diastolic (congestive) heart failure: Secondary | ICD-10-CM | POA: Diagnosis not present

## 2018-07-05 NOTE — ED Provider Notes (Signed)
[facility]-EMERGENCY DEPT Provider Note   CSN: [id number] Arrival date & time: 07/05/18  1912     History   Chief Complaint Chief Complaint  Patient presents with  . Leg Pain  . Knee Pain    [name] is a 42 y.o. male.  42 y.o male with a PMH HTN, NSVT, CHD, DM presents to the ED with a chief complaint of left knee pain x 6 days. Patient states the pain first began on his left hip and now has migrate to his knee.Today patient was going to visit a family member upstairs when he stepped out of the car, he felt "a pop". He reports the pain as 10/10 worse with flexion of his knee joint. Patient states he has tried applying icy hot to the area which seem to relieve the symptoms temporarily.He denies any calf swelling or tenderness, fever, shortness of breath or chest pain.      Past Medical History:  Diagnosis Date  . Depression, major, single episode 01/14/2017  . Diabetes mellitus (HCC)   . ED (erectile dysfunction)   . History of syncope 01/29/2015  . Hypertension   . 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 Asymptomatic.   Marland Kitchen Nonischemic cardiomyopathy (HCC)    a.  echo 4/06: EF 30%, mild to mod MR, mild RAE, inf HK, lat HK , ant HK;    b.  cath 4/06: no CAD, EF 20-25%  . NSVT (nonsustained ventricular tachycardia) (HCC)   . Obesity   . Systolic CHF, chronic (HCC)    EF 11/17 25-30%, s/p ICD    Patient Active Problem List   Diagnosis Date Noted  . Onychomycosis 01/16/2017  . Depression, major, single episode 01/14/2017  . Allergic rhinitis 04/28/2015  . Chest pain 04/21/2015  . Prolonged Q-T interval on ECG 01/31/2015  . ICD (MDT) in place 01/29/2015  . Obesity 01/29/2015  . Healthcare maintenance 12/11/2014  . Cocaine use 06/23/2012  . Tobacco use 09/20/2011  . ED (erectile dysfunction) 08/02/2011  . Chronic combined systolic and diastolic congestive heart failure (HCC) 06/21/2011  . Diabetes  mellitus with stage 2 chronic kidney disease (HCC) 04/28/2009  . Hyperlipidemia 04/28/2009  . Essential hypertension 04/28/2009    Past Surgical History:  Procedure Laterality Date  . CARDIAC CATHETERIZATION  09/2011; 02/2013; 04/2013  . CARDIAC DEFIBRILLATOR PLACEMENT  08/23/2013  . IMPLANTABLE CARDIOVERTER DEFIBRILLATOR IMPLANT N/A 08/23/2013   Procedure: IMPLANTABLE CARDIOVERTER DEFIBRILLATOR IMPLANT;  Surgeon: Duke Salvia, MD;  Location: [facility] CATH LAB;  Service: Cardiovascular;  Laterality: N/A;  . LEFT AND RIGHT HEART CATHETERIZATION WITH CORONARY ANGIOGRAM N/A 09/20/2011   Procedure: LEFT AND RIGHT HEART CATHETERIZATION WITH CORONARY ANGIOGRAM;  Surgeon: Dolores Patty, MD;  Location: [facility] CATH LAB;  Service: Cardiovascular;  Laterality: N/A;  . MULTIPLE EXTRACTIONS WITH ALVEOLOPLASTY N/A 01/26/2013   Procedure:  EXTRACION  TOOTH # 19 WITH ALVEOLOPLASTY;  Surgeon: Charlynne Pander, DDS;  Location: [facility] OR;  Service: Oral Surgery;  Laterality: N/A;  . RIGHT HEART CATHETERIZATION N/A 02/22/2013   Procedure: RIGHT HEART CATH;  Surgeon: Dolores Patty, MD;  Location: [facility] CATH LAB;  Service: Cardiovascular;  Laterality: N/A;  . RIGHT HEART CATHETERIZATION N/A 05/03/2013   Procedure: RIGHT HEART CATH;  Surgeon: Dolores Patty, MD;  Location: [facility] CATH LAB;  Service: Cardiovascular;  Laterality: N/A;        Home Medications    Prior to Admission medications   Medication Sig Start Date End  Date Taking? Authorizing Provider  amiodarone (PACERONE) 200 MG tablet Take 1 tablet (200 mg total) by mouth daily. 04/20/17   Nyra Market, MD  aspirin EC 81 MG EC tablet Take 1 tablet (81 mg total) by mouth daily. 04/23/15   Lorenda Hatchet, MD  atorvastatin (LIPITOR) 40 MG tablet TAKE 1 TABLET BY MOUTH ONCE DAILY 01/25/18   Nyra Market, MD  BIDIL 20-37.5 MG tablet TAKE 1 TABLET BY MOUTH 3 TIMES DAILY. 01/26/18   Bensimhon, Bevelyn Buckles, MD  buPROPion (WELLBUTRIN XL) 300 MG 24 hr tablet Take 1 tablet  (300 mg total) by mouth daily. 08/10/17 08/10/18  Nyra Market, MD  carvedilol (COREG) 12.5 MG tablet Take 1.5 tablets (18.75 mg total) by mouth 2 (two) times daily with a meal. 05/15/18   Clegg, Amy D, NP  ENTRESTO 97-103 MG TAKE 1 TABLET BY MOUTH TWICE DAILY 05/15/18   Bensimhon, Bevelyn Buckles, MD  glucose blood test strip The patient is insulin requiring, ICD 10 code E11.9. The patient tests 3 times per day. 06/08/17   Nyra Market, MD  Insulin Pen Needle (BD PEN NEEDLE NANO U/F) 32G X 4 MM MISC Inject 1 application into the skin at bedtime. The patient is insulin requiring, ICD 10 code E11.9. The patient injects 1 times per day. 06/08/17   Nyra Market, MD  Lancets 30G MISC 1 applicator by Does not apply route 4 (four) times daily. The patient is insulin requiring, ICD 10 code E11.9.The patient tests 4x per day. 06/08/17   Nyra Market, MD  LANTUS SOLOSTAR 100 UNIT/ML Solostar Pen INJECT 20 UNITS INTO THE SKIN DAILY AT 10 PM. 12/16/17   Nyra Market, MD  metFORMIN (GLUCOPHAGE-XR) 500 MG 24 hr tablet Take 2 tablets by mouth with breakfast, and 1 tablet by mouth every with dinner. 01/25/18   Nyra Market, MD  metolazone (ZAROXOLYN) 5 MG tablet Take only as directed by CHF clinic ([phone]) Patient not taking: Reported on 05/15/2018 05/02/17   Little Ishikawa, NP  potassium chloride SA (K-DUR,KLOR-CON) 20 MEQ tablet Take 2 tablets (40 mEq total) by mouth 2 (two) times daily. 03/14/18   Clegg, Amy D, NP  spironolactone (ALDACTONE) 25 MG tablet Take 1 tablet (25 mg total) by mouth at bedtime. 06/30/18   Clegg, Amy D, NP  torsemide (DEMADEX) 20 MG tablet Take 80 mg (4 Tablets) in the AM and 60 mg (3 Tablets) in the PM. 06/29/18   Bensimhon, Bevelyn Buckles, MD    Family History Family History  Problem Relation Age of Onset  . Coronary artery disease Mother 25       s/p PCI  . Lung cancer Father   . Diabetes type II Maternal Uncle   . Coronary artery disease Maternal Uncle   . Stroke Neg Hx   . Heart  attack Neg Hx     Social History Social History   Tobacco Use  . Smoking status: Current Every Day Smoker    Packs/day: 0.25    Years: 8.00    Pack years: 2.00    Types: Cigarettes  . Smokeless tobacco: Never Used  . Tobacco comment: ~3 cigarettes a day  Substance Use Topics  . Alcohol use: Yes    Alcohol/week: 0.0 standard drinks    Comment: 1-2 beers per week  . Drug use: Yes    Types: Cocaine    Comment: 08/10/17 - last use one week ago     Allergies   Bydureon [exenatide] and Penicillins   Review of  Systems Review of Systems  Constitutional: Negative for fever.  Respiratory: Negative for shortness of breath.   Cardiovascular: Negative for chest pain.  Musculoskeletal: Positive for arthralgias.  Skin: Negative for wound.  All other systems reviewed and are negative.    Physical Exam Updated Vital Signs BP (!) 139/91 (BP Location: Left Arm)   Pulse 88   Temp 98.1 F (36.7 C) (Oral)   Resp 19   Ht 6\' 4"  (1.93 m)   Wt (!) 152 kg   SpO2 99%   BMI 40.78 kg/m   Physical Exam  Constitutional: He is oriented to person, place, and time. He appears well-developed and well-nourished.  Neck: Normal range of motion. Neck supple.  Cardiovascular: Normal heart sounds.  Pulses:      Dorsalis pedis pulses are 2+ on the right side, and 2+ on the left side.       Posterior tibial pulses are 2+ on the right side, and 2+ on the left side.  Pulmonary/Chest: Effort normal and breath sounds normal.  Abdominal: Soft.  Musculoskeletal: He exhibits no edema, tenderness or deformity.       Left knee: Normal. He exhibits normal range of motion, no swelling, no effusion, no ecchymosis, no deformity, no laceration, no erythema, normal alignment and no LCL laxity.       Left ankle: He exhibits normal pulse.       Legs: Pulses present, no deformity or laceration noted. No crepitus upon flexion of the knee. Full ROM of left knee.   Neurological: He is alert and oriented to person,  place, and time.  Skin: Skin is warm and dry. Capillary refill takes less than 2 seconds.  Nursing note and vitals reviewed.    ED Treatments / Results  Labs (all labs ordered are listed, but only abnormal results are displayed) Labs Reviewed - No data to display  EKG None  Radiology Dg Knee Complete 4 Views Left  Result Date: 07/05/2018 CLINICAL DATA:  Pain following fall EXAM: LEFT KNEE - COMPLETE 4+ VIEW COMPARISON:  None. FINDINGS: Frontal, lateral, and bilateral oblique views were obtained. There is no appreciable fracture or dislocation. There is a small joint effusion. Joint spaces appear normal. There are spurs arising from the anterior patella. No erosive change. IMPRESSION: Anterior patellar spurs. Suspect a degree of quadriceps and patellar tendinosis. Small joint effusion.  No fracture or dislocation. Electronically Signed   By: 07/07/2018 III M.D.   On: 07/05/2018 20:20    Procedures Procedures (including critical care time)  Medications Ordered in ED Medications - No data to display   Initial Impression / Assessment and Plan / ED Course  I have reviewed the triage vital signs and the nursing notes.  Pertinent labs & imaging results that were available during my care of the patient were reviewed by me and considered in my medical decision making (see chart for details).     Patient presents with left knee pain x 6 days. Patient states he got out of the car today when he felt a pop on his left knee.  DG left knee showed no fracture or acute abnormality but a small joint effusion. I have provided a knee sleeve for patient comfort, I have also provided him with a referral to an Orthopedist. Patient is advised to make appointment. Return precautions provided.   Final Clinical Impressions(s) / ED Diagnoses   Final diagnoses:  Acute pain of left knee    ED Discharge Orders    None  Claude Manges, PA-C 07/05/18 2055    Loren Racer,  MD 07/05/18 2252

## 2018-07-05 NOTE — Discharge Instructions (Addendum)
I have provided the referral for an Orthopedic specialist. Please schedule an appointment for further evaluation of your left knee as needed. Alternate ibuprofen and tylenol for the pain.If your symptoms worsen, you experience numbness or tingling in your leg or pain is out of proportion.

## 2018-07-05 NOTE — ED Triage Notes (Signed)
Patient states that when he was here visiting someone when he was trying to get out of the car and felt his knee pop and he fell. Patient states that he has had some swelling over the past week before hand.

## 2018-07-06 ENCOUNTER — Ambulatory Visit (INDEPENDENT_AMBULATORY_CARE_PROVIDER_SITE_OTHER): Payer: Medicaid Other | Admitting: *Deleted

## 2018-07-06 DIAGNOSIS — I428 Other cardiomyopathies: Secondary | ICD-10-CM | POA: Diagnosis not present

## 2018-07-06 DIAGNOSIS — I5042 Chronic combined systolic (congestive) and diastolic (congestive) heart failure: Secondary | ICD-10-CM

## 2018-07-07 ENCOUNTER — Encounter: Payer: Self-pay | Admitting: Cardiology

## 2018-07-07 NOTE — Progress Notes (Signed)
Remote ICD transmission.   

## 2018-07-31 ENCOUNTER — Encounter: Payer: Medicaid Other | Admitting: Internal Medicine

## 2018-08-02 ENCOUNTER — Encounter: Payer: Medicaid Other | Admitting: Internal Medicine

## 2018-08-11 LAB — CUP PACEART REMOTE DEVICE CHECK
Battery Voltage: 2.99 V
Brady Statistic RV Percent Paced: 0.08 %
HighPow Impedance: 48 Ohm
HighPow Impedance: 60 Ohm
Implantable Lead Location: 753860
Implantable Pulse Generator Implant Date: 20141009
Lead Channel Impedance Value: 437 Ohm
Lead Channel Pacing Threshold Amplitude: 0.75 V
Lead Channel Pacing Threshold Pulse Width: 0.4 ms
Lead Channel Sensing Intrinsic Amplitude: 13.125 mV
Lead Channel Setting Sensing Sensitivity: 0.45 mV
MDC IDC LEAD IMPLANT DT: 20141009
MDC IDC MSMT BATTERY REMAINING LONGEVITY: 79 mo
MDC IDC MSMT LEADCHNL RV IMPEDANCE VALUE: 285 Ohm
MDC IDC MSMT LEADCHNL RV SENSING INTR AMPL: 13.125 mV
MDC IDC SESS DTM: 20190822155211
MDC IDC SET LEADCHNL RV PACING AMPLITUDE: 2.5 V
MDC IDC SET LEADCHNL RV PACING PULSEWIDTH: 0.4 ms

## 2018-08-15 ENCOUNTER — Encounter (HOSPITAL_COMMUNITY): Payer: Medicaid Other

## 2018-08-22 ENCOUNTER — Other Ambulatory Visit: Payer: Self-pay | Admitting: Internal Medicine

## 2018-08-22 DIAGNOSIS — R55 Syncope and collapse: Secondary | ICD-10-CM

## 2018-08-22 MED FILL — ENTRESTO 97 MG-103 MG TAB: 97-103 | 30 days supply | Qty: 60 | Fill #2

## 2018-08-22 MED FILL — TORSEMIDE 20 MG TABLET: 20 | 30 days supply | Qty: 210 | Fill #1

## 2018-08-22 MED FILL — CARVEDILOL 12.5 MG TABLET: 12.5 | 30 days supply | Qty: 60 | Fill #2

## 2018-08-28 NOTE — Progress Notes (Addendum)
Patient ID: Douglas [name], male   DOB: 10-13-1976, 42 y.o.   MRN: [id number]  EP: Dr Graciela Husbands PCP: Dr. Johna Roles Primary Cardiologist: Dr. [name]  History of Present Illness: Douglas [name] is a 42 y.o. male with a h/o HTN, HL, obesity, smoker, cocaine abuse,  NICM and chronic systolic heart failure. He is S/P Medtronic ICD due to NICM. Management has been complicated by severe non-compliance.   Underwent cath in 2006 which showed normal cors with EF 20-25%.   EF had recovered in late 2013 but has again fallen to 15% per echo 01/20/13.  He was admitted to [facility] 02/21/13 from clinic for low output symptoms and volume overload.  He required IV lasix and metolazone.  He diuresed 19 pounds with discharge weight of 254 pounds.  He had runs of VT therefore he was started on amiodarone per EP and Lifevest was placed at discharge.  He underwent RHC on 02/22/13.  He has not required metolazone.   He was admitted in 1/16 with VT and ICD shock x 2.   Today he returns for HF follow up. Last visit coreg was increased, but he did not increase coreg. He also ran out of spiro 1 month ago. Overall doing well. Only SOB with stairs. Able to walk on flat ground as far as wants. Stays busy working at [employer] and taking care of his two grandchildren. He ran out of spiro 1 month ago and says refills were called into the wrong pharmacy. Has not needed PRN metolazone in years. Denies orthopnea, PND, or edema. No CP or dizziness. Occasionally uses cocaine. Continues to smoke 6-8 cigarettes/day. Minimal ETOH intake. Taking all medications other than spiro. Weights 334-327 lbs at home. Sister cooks most of his meals and limits salt.  Optivol: thoracic impedence right at threshold and trending up. Optivol indicates volume overload for the last 2 weeks. No VT/VF. Active 4 hours/day.   RHC 02/22/13  RA = 12  RV = 49/6/15  PA = 54/29 (41)  PCW = 28 (v= 40)  Fick cardiac output/index = 4.3/1.7  PVR = 2.6 Woods  SVR = 1580  dynes  FA sat = 98%  PA sat = 53%, 54%  Non-invasive BP = 124/86 (97)  RHC 05/03/13 RA = 2  RV = 29/2/2  PA = 26/10 (17)  PCW = 3  Fick cardiac output/index = 6.1/2.3  PVR = 2.3  FA sat = 93%  PA sat = 65%, 67%  ECHO 06/25/13 EF 20-25% ECHO 05/01/14 EF 35-40%, moderately dilated LV, mild LVH, diffuse hypokinesis Echo 6/16 EF 25-30%, mild LVH, moderately dilated LV.  ECHO 09/23/2016 EF 25-30%. ECHO 08/19/2017 EF 40-45% grade IDD  August 2012 CPX VO2 20 SH: lives with his Douglas [name]. Rare ETOH, smokes rarely. Remote cocaine.  FH: Douglas [name] has CAD.    Review of systems complete and found to be negative unless listed in HPI.   Past Medical History:  Diagnosis Date  . Depression, major, single episode 01/14/2017  . Diabetes mellitus (HCC)   . ED (erectile dysfunction)   . History of syncope 01/29/2015  . Hypertension   . 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 Asymptomatic.   Marland Kitchen Nonischemic cardiomyopathy (HCC)    a.  echo 4/06: EF 30%, mild to mod MR, mild RAE, inf HK, lat HK , ant HK;    b.  cath 4/06: no CAD, EF 20-25%  . NSVT (nonsustained ventricular tachycardia) (HCC)   . Obesity   .  Systolic CHF, chronic (HCC)    EF 11/17 25-30%, s/p ICD    Current Outpatient Medications  Medication Sig Dispense Refill  . amiodarone (PACERONE) 200 MG tablet Take 1 tablet (200 mg total) by mouth daily. 30 tablet 5  . aspirin EC 81 MG EC tablet Take 1 tablet (81 mg total) by mouth daily. 30 tablet 0  . atorvastatin (LIPITOR) 40 MG tablet TAKE 1 TABLET BY MOUTH ONCE DAILY 30 tablet 2  . BIDIL 20-37.5 MG tablet TAKE 1 TABLET BY MOUTH 3 TIMES DAILY. 90 tablet 3  . carvedilol (COREG) 12.5 MG tablet Take 12.5 mg by mouth 2 (two) times daily with a meal.    . ENTRESTO 97-103 MG TAKE 1 TABLET BY MOUTH TWICE DAILY 60 tablet 11  . glucose blood test strip The patient is insulin requiring, ICD 10 code E11.9. The patient tests 3 times per day. 100 each 12  . Insulin Pen  Needle (BD PEN NEEDLE NANO U/F) 32G X 4 MM MISC Inject 1 application into the skin at bedtime. The patient is insulin requiring, ICD 10 code E11.9. The patient injects 1 times per day. 100 each 3  . Lancets 30G MISC 1 applicator by Does not apply route 4 (four) times daily. The patient is insulin requiring, ICD 10 code E11.9.The patient tests 4x per day. 100 each 5  . LANTUS SOLOSTAR 100 UNIT/ML Solostar Pen INJECT 20 UNITS INTO THE SKIN DAILY AT 10 PM. 15 mL 1  . metFORMIN (GLUCOPHAGE-XR) 500 MG 24 hr tablet Take 2 tablets by mouth with breakfast, and 1 tablet by mouth every with dinner. 180 tablet 0  . potassium chloride SA (K-DUR,KLOR-CON) 20 MEQ tablet Take 2 tablets (40 mEq total) by mouth 2 (two) times daily. 120 tablet 2  . torsemide (DEMADEX) 20 MG tablet Take 80 mg (4 Tablets) in the AM and 60 mg (3 Tablets) in the PM. 210 tablet 3  . buPROPion (WELLBUTRIN XL) 300 MG 24 hr tablet Take 1 tablet (300 mg total) by mouth daily. 90 tablet 1  . metolazone (ZAROXOLYN) 5 MG tablet Take only as directed by CHF clinic ([phone]) (Patient not taking: Reported on 08/29/2018) 10 tablet 0   No current facility-administered medications for this encounter.     Allergies  Allergen Reactions  . Bydureon [Exenatide] Rash  . Penicillins Other (See Comments)    Unknown childhood reaction     Vitals:   08/29/18 0947  BP: 110/76  Pulse: 83  SpO2: 95%  Weight: (!) 148.4 kg (327 lb 3.2 oz)   Wt Readings from Last 3 Encounters:  08/29/18 (!) 148.4 kg (327 lb 3.2 oz)  07/05/18 (!) 152 kg (335 lb)  05/15/18 (!) 150.6 kg (332 lb)   PHYSICAL EXAM: General: Obese. No resp difficulty. HEENT: Normal Neck: Supple. JVP difficult. Carotids 2+ bilat; no bruits. No thyromegaly or nodule noted. Cor: PMI nondisplaced. RRR, No M/G/R noted Lungs: CTAB, normal effort. Abdomen: Soft, non-tender, non-distended, no HSM. No bruits or masses. +BS  Extremities: No cyanosis, clubbing, or rash. R and LLE no  edema.  Neuro: Alert & orientedx3, cranial nerves grossly intact. moves all 4 extremities w/o difficulty. Affect pleasant   1) Chronic systolic HF: Nonischemic cardiomyopathy. Medtronic ICD  Echo 09/2016 with EF 25-30%, RV systolic function mildly reduced, ECHO 08/19/2017 EF 40-45%. - NYHA II-III  - Volume status mildly elevated on exam.  - Increase torsemide to 80 mg BID x 2 days, then back to torsemide 80mg /60mg .  -  Continue carvediol 12.5 mg twice a day.  - Continue Bidil 1 tab TID - Continue entresto 97/103 twice a day.  - Restart spiro 12.5 mg daily. BMET today and in 7-10 days 2) HTN:  - Well controlled today.   3) VT:  - Continue amio 200 mg daily.  - TSH/LFTs okay on 02/2018 - No VT on ICD interrogation. 4) Cocaine abuse:   - Continues to use intermittently due to stress. Discussed cessation.  5) Tobacco Abuse - Smoking 5-8 cigarettes/day 6) DMII- on insulin. No change.   Increase torsemide to 80 mg BID x 2 days, then back to torsemide 80mg /60mg .  Restart spiro 12.5 mg daily. BMET today and in 7-10 days.  F/u pharmacy in 4 weeks.  F/u 3 months with echo with Dr 9-10 CSW saw him today to assist with finding a new PCP  [name], NP  08/29/2018   Greater than 50% of the 25 minute visit was spent in counseling/coordination of care regarding disease state education, salt/fluid restriction, sliding scale diuretics, and medication compliance.

## 2018-08-29 ENCOUNTER — Ambulatory Visit (HOSPITAL_COMMUNITY)
Admission: RE | Admit: 2018-08-29 | Discharge: 2018-08-29 | Disposition: A | Discharge status: Home / Self Care | Payer: Medicaid Other | Source: Ambulatory Visit | Attending: Cardiology | Admitting: Cardiology

## 2018-08-29 ENCOUNTER — Encounter (HOSPITAL_COMMUNITY): Payer: Self-pay

## 2018-08-29 ENCOUNTER — Telehealth: Payer: Self-pay

## 2018-08-29 VITALS — BP 110/76 | HR 83 | Wt 327.2 lb

## 2018-08-29 DIAGNOSIS — Z79899 Other long term (current) drug therapy: Secondary | ICD-10-CM | POA: Insufficient documentation

## 2018-08-29 DIAGNOSIS — Z8249 Family history of ischemic heart disease and other diseases of the circulatory system: Secondary | ICD-10-CM | POA: Insufficient documentation

## 2018-08-29 DIAGNOSIS — Z95810 Presence of automatic (implantable) cardiac defibrillator: Secondary | ICD-10-CM | POA: Diagnosis not present

## 2018-08-29 DIAGNOSIS — F1490 Cocaine use, unspecified, uncomplicated: Secondary | ICD-10-CM

## 2018-08-29 DIAGNOSIS — Z9119 Patient's noncompliance with other medical treatment and regimen: Secondary | ICD-10-CM | POA: Insufficient documentation

## 2018-08-29 DIAGNOSIS — F1410 Cocaine abuse, uncomplicated: Secondary | ICD-10-CM | POA: Diagnosis not present

## 2018-08-29 DIAGNOSIS — F17210 Nicotine dependence, cigarettes, uncomplicated: Secondary | ICD-10-CM | POA: Diagnosis not present

## 2018-08-29 DIAGNOSIS — Z7982 Long term (current) use of aspirin: Secondary | ICD-10-CM | POA: Diagnosis not present

## 2018-08-29 DIAGNOSIS — I428 Other cardiomyopathies: Secondary | ICD-10-CM | POA: Diagnosis not present

## 2018-08-29 DIAGNOSIS — I472 Ventricular tachycardia, unspecified: Secondary | ICD-10-CM

## 2018-08-29 DIAGNOSIS — Z794 Long term (current) use of insulin: Secondary | ICD-10-CM | POA: Diagnosis not present

## 2018-08-29 DIAGNOSIS — F329 Major depressive disorder, single episode, unspecified: Secondary | ICD-10-CM | POA: Insufficient documentation

## 2018-08-29 DIAGNOSIS — E785 Hyperlipidemia, unspecified: Secondary | ICD-10-CM | POA: Insufficient documentation

## 2018-08-29 DIAGNOSIS — Z880 Allergy status to penicillin: Secondary | ICD-10-CM | POA: Insufficient documentation

## 2018-08-29 DIAGNOSIS — E119 Type 2 diabetes mellitus without complications: Secondary | ICD-10-CM | POA: Diagnosis not present

## 2018-08-29 DIAGNOSIS — I5042 Chronic combined systolic (congestive) and diastolic (congestive) heart failure: Secondary | ICD-10-CM

## 2018-08-29 DIAGNOSIS — E669 Obesity, unspecified: Secondary | ICD-10-CM | POA: Diagnosis not present

## 2018-08-29 DIAGNOSIS — I5022 Chronic systolic (congestive) heart failure: Secondary | ICD-10-CM | POA: Diagnosis not present

## 2018-08-29 DIAGNOSIS — I10 Essential (primary) hypertension: Secondary | ICD-10-CM

## 2018-08-29 DIAGNOSIS — Z720 Tobacco use: Secondary | ICD-10-CM

## 2018-08-29 DIAGNOSIS — I110 Hypertensive heart disease with heart failure: Secondary | ICD-10-CM | POA: Diagnosis not present

## 2018-08-29 LAB — BASIC METABOLIC PANEL
ANION GAP: 6 (ref 5–15)
BUN: 16 mg/dL (ref 6–20)
CHLORIDE: 103 mmol/L (ref 98–111)
CO2: 30 mmol/L (ref 22–32)
Calcium: 8.6 mg/dL — ABNORMAL LOW (ref 8.9–10.3)
Creatinine, Ser: 1.23 mg/dL (ref 0.61–1.24)
GFR calc non Af Amer: >60 mL/min (ref >60)
Glucose, Bld: 183 mg/dL — ABNORMAL HIGH (ref 70–99)
POTASSIUM: 3.6 mmol/L (ref 3.5–5.1)
SODIUM: 139 mmol/L (ref 135–145)

## 2018-08-29 MED ORDER — TORSEMIDE 20 MG PO TABS: ORAL_TABLET | ORAL | 3 refills | Status: DC

## 2018-08-29 MED ORDER — SPIRONOLACTONE 25 MG PO TABS: 12.5000 mg | ORAL_TABLET | Freq: Every day | ORAL | 3 refills | Status: DC

## 2018-08-29 NOTE — Progress Notes (Signed)
CSW referred to assist patient with obtaining PCP.  Per previous CSW notes patient was assigned to go to [facility] on his Medicaid card.  Patient missed 3 consecutive appointments at clinic so they are not willing to see him anymore.  CSW called Erskine Squibb at [facility] and had patient scheduled for PCP appointment at Primary Care at [facility] appointment for 09/04/18 at 9:30am- patient informed  CSW then discussed patient's Social Determinant of health.  Patient reports no issues with housing- stays with his sister and does not pay rent or utilities- no reason to believe he will not be able to stay there for foreseeable future.    Patient reports his is working part-time (20 hours a week) at [employer] and that he receives food-stamps.  Patient reports that sometimes it is hard to pay for food or medications but that it has never become a serious concern.  CSW provided with list of local food pantries and free meals in case patient has concerns in the future.  CSW will continue to follow in clinic and assist as needed  [name], LCSW Clinical Social Worker [phone]

## 2018-08-29 NOTE — Addendum Note (Signed)
Encounter addended by: Alford Highland, NP on: 08/29/2018 3:21 PM  Actions taken: Sign clinical note

## 2018-08-29 NOTE — Patient Instructions (Signed)
INCREASE Torsemide to 80 mg (4 tabs) twice a day, then resume 80 mg (4 tabs) in the AM and 60 mg (3 tabs) in the PM RESTART Spironolactone 12.5 mg, one half tab daily  Labs today We will only contact you if something comes back abnormal or we need to make some changes. Otherwise no news is good news!  Labs needed in 7-10 days   Your physician recommends that you schedule a follow-up appointment in: 3-4 weeks  in the [facility] Practition[facility] (PA/NP) Clinic   Your physician recommends that you schedule a follow-up appointment in: 3 months with Dr [name] and echo  Your physician has requested that you have an echocardiogram. Echocardiography is a painless test that uses sound waves to create images of your heart. It provides your doctor with information about the size and shape of your heart and how well your heart's chambers and valves are working. This procedure takes approximately one hour. There are no restrictions for this procedure.   Do the following things EVERYDAY: 1) Weigh yourself in the morning before breakfast. Write it down and keep it in a log. 2) Take your medicines as prescribed 3) Eat low salt foods-Limit salt (sodium) to 2000 mg per day.  4) Stay as active as you can everyday 5) Limit all fluids for the day to less than 2 liters

## 2018-08-29 NOTE — Telephone Encounter (Signed)
Call received from Clent Demark, LCSW requesting an appointment for the patient to establish care with a PCP. Informed her that an appointment has been scheduled for him at [facility] on 09/04/18 @ 0930.

## 2018-09-04 ENCOUNTER — Ambulatory Visit: Payer: Medicaid Other | Admitting: Family Medicine

## 2018-09-05 ENCOUNTER — Other Ambulatory Visit: Payer: Self-pay | Admitting: Adult Health

## 2018-09-05 ENCOUNTER — Other Ambulatory Visit (HOSPITAL_COMMUNITY): Payer: Medicaid Other

## 2018-09-19 ENCOUNTER — Inpatient Hospital Stay (HOSPITAL_COMMUNITY)
Admission: RE | Admit: 2018-09-19 | Discharge: 2018-09-19 | Disposition: A | Discharge status: Home / Self Care | Payer: Medicaid Other | Source: Ambulatory Visit

## 2018-09-20 ENCOUNTER — Other Ambulatory Visit: Payer: Self-pay

## 2018-09-20 ENCOUNTER — Emergency Department (HOSPITAL_COMMUNITY)
Admission: EM | Admit: 2018-09-20 | Discharge: 2018-09-20 | Disposition: A | Discharge status: Home / Self Care | Payer: Medicaid Other | Attending: Emergency Medicine | Admitting: Emergency Medicine

## 2018-09-20 ENCOUNTER — Encounter (HOSPITAL_COMMUNITY): Payer: Self-pay | Admitting: *Deleted

## 2018-09-20 DIAGNOSIS — I428 Other cardiomyopathies: Secondary | ICD-10-CM | POA: Diagnosis not present

## 2018-09-20 DIAGNOSIS — E119 Type 2 diabetes mellitus without complications: Secondary | ICD-10-CM | POA: Insufficient documentation

## 2018-09-20 DIAGNOSIS — F17210 Nicotine dependence, cigarettes, uncomplicated: Secondary | ICD-10-CM | POA: Diagnosis not present

## 2018-09-20 DIAGNOSIS — I10 Essential (primary) hypertension: Secondary | ICD-10-CM | POA: Insufficient documentation

## 2018-09-20 DIAGNOSIS — Z79899 Other long term (current) drug therapy: Secondary | ICD-10-CM | POA: Diagnosis not present

## 2018-09-20 DIAGNOSIS — K0889 Other specified disorders of teeth and supporting structures: Secondary | ICD-10-CM | POA: Insufficient documentation

## 2018-09-20 MED ORDER — CLINDAMYCIN HCL 150 MG PO CAPS: 450.0000 mg | ORAL_CAPSULE | Freq: Three times a day (TID) | ORAL | 0 refills | Status: DC

## 2018-09-20 MED ORDER — PROBIOTIC 250 MG PO CAPS: 1.0000 | ORAL_CAPSULE | Freq: Every day | ORAL | 0 refills | Status: DC

## 2018-09-20 MED ORDER — IBUPROFEN 600 MG PO TABS: 600.0000 mg | ORAL_TABLET | Freq: Four times a day (QID) | ORAL | 0 refills | Status: DC | PRN

## 2018-09-20 MED ORDER — ACETAMINOPHEN 500 MG PO TABS: 500.0000 mg | ORAL_TABLET | Freq: Four times a day (QID) | ORAL | 0 refills | Status: DC | PRN

## 2018-09-20 NOTE — ED Provider Notes (Signed)
[facility]-EMERGENCY DEPT Provider Note   CSN: [id number] Arrival date & time: 09/20/18  1929     History   Chief Complaint Chief Complaint  Patient presents with  . Dental Pain    [name] is a 42 y.o. male with history of diabetes, nonischemic cardiomyopathy, hypertension who presents with dental pain for the past 2 days.  He reports his tooth broke off 5 days ago.  He reports taking an ibuprofen and a Tylenol PM at home with some relief.  He denies any fevers or neck pain.  He does not have a Education officer, community.  HPI  Past Medical History:  Diagnosis Date  . Depression, major, single episode 01/14/2017  . Diabetes mellitus (HCC)   . ED (erectile dysfunction)   . History of syncope 01/29/2015  . Hypertension   . 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 Asymptomatic.   Marland Kitchen Nonischemic cardiomyopathy (HCC)    a.  echo 4/06: EF 30%, mild to mod MR, mild RAE, inf HK, lat HK , ant HK;    b.  cath 4/06: no CAD, EF 20-25%  . NSVT (nonsustained ventricular tachycardia) (HCC)   . Obesity   . Systolic CHF, chronic (HCC)    EF 11/17 25-30%, s/p ICD    Patient Active Problem List   Diagnosis Date Noted  . Onychomycosis 01/16/2017  . Depression, major, single episode 01/14/2017  . Allergic rhinitis 04/28/2015  . Chest pain 04/21/2015  . Prolonged Q-T interval on ECG 01/31/2015  . ICD (MDT) in place 01/29/2015  . Obesity 01/29/2015  . Healthcare maintenance 12/11/2014  . Cocaine use 06/23/2012  . Tobacco use 09/20/2011  . ED (erectile dysfunction) 08/02/2011  . Chronic combined systolic and diastolic congestive heart failure (HCC) 06/21/2011  . Diabetes mellitus with stage 2 chronic kidney disease (HCC) 04/28/2009  . Hyperlipidemia 04/28/2009  . Essential hypertension 04/28/2009    Past Surgical History:  Procedure Laterality Date  . CARDIAC CATHETERIZATION  09/2011; 02/2013; 04/2013  . CARDIAC DEFIBRILLATOR  PLACEMENT  08/23/2013  . IMPLANTABLE CARDIOVERTER DEFIBRILLATOR IMPLANT N/A 08/23/2013   Procedure: IMPLANTABLE CARDIOVERTER DEFIBRILLATOR IMPLANT;  Surgeon: Duke Salvia, MD;  Location: [facility] CATH LAB;  Service: Cardiovascular;  Laterality: N/A;  . LEFT AND RIGHT HEART CATHETERIZATION WITH CORONARY ANGIOGRAM N/A 09/20/2011   Procedure: LEFT AND RIGHT HEART CATHETERIZATION WITH CORONARY ANGIOGRAM;  Surgeon: Dolores Patty, MD;  Location: [facility] CATH LAB;  Service: Cardiovascular;  Laterality: N/A;  . MULTIPLE EXTRACTIONS WITH ALVEOLOPLASTY N/A 01/26/2013   Procedure:  EXTRACION  TOOTH # 19 WITH ALVEOLOPLASTY;  Surgeon: Charlynne Pander, DDS;  Location: [facility] OR;  Service: Oral Surgery;  Laterality: N/A;  . RIGHT HEART CATHETERIZATION N/A 02/22/2013   Procedure: RIGHT HEART CATH;  Surgeon: Dolores Patty, MD;  Location: [facility] CATH LAB;  Service: Cardiovascular;  Laterality: N/A;  . RIGHT HEART CATHETERIZATION N/A 05/03/2013   Procedure: RIGHT HEART CATH;  Surgeon: Dolores Patty, MD;  Location: [facility] CATH LAB;  Service: Cardiovascular;  Laterality: N/A;        Home Medications    Prior to Admission medications   Medication Sig Start Date End Date Taking? Authorizing Provider  acetaminophen (TYLENOL) 500 MG tablet Take 1 tablet (500 mg total) by mouth every 6 (six) hours as needed. 09/20/18   Oktober Glazer, Waylan Boga, PA-C  amiodarone (PACERONE) 200 MG tablet Take 1 tablet (200 mg total) by mouth daily. 04/20/17   Nyra Market, MD  aspirin EC 81  MG EC tablet Take 1 tablet (81 mg total) by mouth daily. 04/23/15   Lorenda Hatchet, MD  atorvastatin (LIPITOR) 40 MG tablet TAKE 1 TABLET BY MOUTH ONCE DAILY 01/25/18   Nyra Market, MD  BIDIL 20-37.5 MG tablet TAKE 1 TABLET BY MOUTH 3 TIMES DAILY. 01/26/18   Bensimhon, Bevelyn Buckles, MD  buPROPion (WELLBUTRIN XL) 300 MG 24 hr tablet Take 1 tablet (300 mg total) by mouth daily. 08/10/17 08/10/18  Nyra Market, MD  carvedilol (COREG) 12.5 MG tablet Take 12.5 mg by mouth  2 (two) times daily with a meal.    [provider]  clindamycin (CLEOCIN) 150 MG capsule Take 3 capsules (450 mg total) by mouth 3 (three) times daily. 09/20/18   Makih Stefanko M, PA-C  ENTRESTO 97-103 MG TAKE 1 TABLET BY MOUTH TWICE DAILY 05/15/18   Bensimhon, Bevelyn Buckles, MD  glucose blood test strip The patient is insulin requiring, ICD 10 code E11.9. The patient tests 3 times per day. 06/08/17   Nyra Market, MD  ibuprofen (ADVIL,MOTRIN) 600 MG tablet Take 1 tablet (600 mg total) by mouth every 6 (six) hours as needed. 09/20/18   Raivyn Kabler, Waylan Boga, PA-C  Insulin Pen Needle (BD PEN NEEDLE NANO U/F) 32G X 4 MM MISC Inject 1 application into the skin at bedtime. The patient is insulin requiring, ICD 10 code E11.9. The patient injects 1 times per day. 06/08/17   Nyra Market, MD  Lancets 30G MISC 1 applicator by Does not apply route 4 (four) times daily. The patient is insulin requiring, ICD 10 code E11.9.The patient tests 4x per day. 06/08/17   Nyra Market, MD  LANTUS SOLOSTAR 100 UNIT/ML Solostar Pen INJECT 20 UNITS INTO THE SKIN DAILY AT 10 PM. 12/16/17   Nyra Market, MD  metFORMIN (GLUCOPHAGE-XR) 500 MG 24 hr tablet Take 2 tablets by mouth with breakfast, and 1 tablet by mouth every with dinner. 01/25/18   Nyra Market, MD  metolazone (ZAROXOLYN) 5 MG tablet Take only as directed by CHF clinic ([phone]) Patient not taking: Reported on 08/29/2018 05/02/17   Little Ishikawa, NP  potassium chloride SA (K-DUR,KLOR-CON) 20 MEQ tablet Take 2 tablets (40 mEq total) by mouth 2 (two) times daily. 03/14/18   Clegg, Amy D, NP  Saccharomyces boulardii (PROBIOTIC) 250 MG CAPS Take 1 capsule by mouth daily. 09/20/18   Florice Hindle, Waylan Boga, PA-C  spironolactone (ALDACTONE) 25 MG tablet Take 0.5 tablets (12.5 mg total) by mouth daily. 08/29/18 11/27/18  Alford Highland, NP  torsemide (DEMADEX) 20 MG tablet Take 4 tablets (80 mg total) by mouth 2 (two) times daily for 2 days, THEN 4 tablets (80 mg total)  every morning for 30 days, THEN 3 tablets (60 mg total) every evening. 08/29/18 10/30/18  Alford Highland, NP    Family History Family History  Problem Relation Age of Onset  . Coronary artery disease Mother 2       s/p PCI  . Lung cancer Father   . Diabetes type II Maternal Uncle   . Coronary artery disease Maternal Uncle   . Stroke Neg Hx   . Heart attack Neg Hx     Social History Social History   Tobacco Use  . Smoking status: Current Every Day Smoker    Packs/day: 0.25    Years: 8.00    Pack years: 2.00    Types: Cigarettes  . Smokeless tobacco: Never Used  . Tobacco comment: ~3 cigarettes a day  Substance Use Topics  .  Alcohol use: Yes    Alcohol/week: 0.0 standard drinks    Comment: 1-2 beers per week  . Drug use: Yes    Types: Cocaine    Comment: 08/10/17 - last use one week ago     Allergies   Bydureon [exenatide] and Penicillins   Review of Systems Review of Systems  Constitutional: Negative for fever.  HENT: Positive for dental problem.   Musculoskeletal: Negative for neck pain.     Physical Exam Updated Vital Signs BP (!) 141/97 (BP Location: Left Arm) Comment: Triage RN, [place] aware  Pulse 87   Temp 98.7 F (37.1 C) (Oral)   Resp 18   Ht 6\' 4"  (1.93 m)   Wt (!) 149.7 kg   SpO2 100%   BMI 40.17 kg/m   Physical Exam  Constitutional: He appears well-developed and well-nourished. No distress.  HENT:  Head: Normocephalic and atraumatic.  Mouth/Throat: Oropharynx is clear and moist. No trismus in the jaw. No oropharyngeal exudate.    No submandibular tenderness or masses  Eyes: Pupils are equal, round, and reactive to light. Conjunctivae are normal. Right eye exhibits no discharge. Left eye exhibits no discharge. No scleral icterus.  Neck: Normal range of motion. Neck supple. No thyromegaly present.  Cardiovascular: Normal rate, regular rhythm, normal heart sounds and intact distal pulses. Exam reveals no gallop and no friction rub.  No  murmur heard. Pulmonary/Chest: Effort normal and breath sounds normal. No stridor. No respiratory distress. He has no wheezes. He has no rales.  Abdominal: Soft. Bowel sounds are normal. He exhibits no distension. There is no tenderness. There is no rebound and no guarding.  Musculoskeletal: He exhibits no edema.  Lymphadenopathy:    He has no cervical adenopathy.  Neurological: He is alert. Coordination normal.  Skin: Skin is warm and dry. No rash noted. He is not diaphoretic. No pallor.  Psychiatric: He has a normal mood and affect.  Nursing note and vitals reviewed.    ED Treatments / Results  Labs (all labs ordered are listed, but only abnormal results are displayed) Labs Reviewed - No data to display  EKG None  Radiology No results found.  Procedures Procedures (including critical care time)  Medications Ordered in ED Medications - No data to display   Initial Impression / Assessment and Plan / ED Course  I have reviewed the triage vital signs and the nursing notes.  Pertinent labs & imaging results that were available during my care of the patient were reviewed by me and considered in my medical decision making (see chart for details).     Patient with dentalgia.  No abscess requiring immediate incision and drainage.  Exam not concerning for Ludwig's angina or pharyngeal abscess.  Will treat with clindamycin, ibuprofen, Tylenol. Pt instructed to follow-up with dentist.  Given dental resources.  Discussed return precautions.  Patient understands and agrees with plan.  Patient vitals stable throughout ED course and discharged in satisfactory condition.   Final Clinical Impressions(s) / ED Diagnoses   Final diagnoses:  Pain, dental    ED Discharge Orders         Ordered    clindamycin (CLEOCIN) 150 MG capsule  3 times daily     09/20/18 2110    ibuprofen (ADVIL,MOTRIN) 600 MG tablet  Every 6 hours PRN     09/20/18 2110    acetaminophen (TYLENOL) 500 MG tablet   Every 6 hours PRN     09/20/18 2110    Saccharomyces boulardii (PROBIOTIC)  250 MG CAPS  Daily     09/20/18 2112           Emi Holes, Cordelia Poche 09/20/18 2122    Arby Barrette, MD 09/21/18 1311

## 2018-09-20 NOTE — ED Triage Notes (Signed)
Pt reports chipped L upper tooth Saturday, his face started to swell Sunday.  Pain onset was today.

## 2018-09-20 NOTE — Discharge Instructions (Addendum)
Medications: Clindamycin, ibuprofen, Tylenol  Treatment: Take clindamycin as prescribed until completed.  Take a probiotic daily while taking this medication.  Do not take in the same swallow, wait a couple hours after taking clindamycin.  You can alternate ibuprofen and Tylenol as prescribed, as needed for pain.  Follow-up: It is important that you follow-up with the dentist, as that will be the definitive treatment for your problem.  Please return to emergency department if you develop any fevers over 100.4, large masses in your neck, lockjaw, or any other new or concerning symptoms.

## 2018-10-02 ENCOUNTER — Encounter: Payer: Medicaid Other | Admitting: Internal Medicine

## 2018-10-04 ENCOUNTER — Encounter: Payer: Self-pay | Admitting: Internal Medicine

## 2018-10-06 ENCOUNTER — Ambulatory Visit: Payer: Medicaid Other

## 2018-10-06 ENCOUNTER — Telehealth: Payer: Self-pay

## 2018-10-06 DIAGNOSIS — Z4502 Encounter for adjustment and management of automatic implantable cardiac defibrillator: Secondary | ICD-10-CM

## 2018-10-06 NOTE — Telephone Encounter (Signed)
Attempted to confirm remote transmission with pt. No answer and was unable to leave a message.   

## 2018-10-09 ENCOUNTER — Encounter: Payer: Self-pay | Admitting: Cardiology

## 2018-10-09 NOTE — Progress Notes (Signed)
No received  

## 2018-10-13 ENCOUNTER — Other Ambulatory Visit: Payer: Self-pay | Admitting: Internal Medicine

## 2018-10-13 DIAGNOSIS — R55 Syncope and collapse: Secondary | ICD-10-CM

## 2018-10-13 MED FILL — CARVEDILOL 12.5 MG TABLET: 12.5 | 30 days supply | Qty: 60 | Fill #3

## 2018-10-13 MED FILL — ENTRESTO 97 MG-103 MG TAB: 97-103 | 30 days supply | Qty: 60 | Fill #3

## 2018-10-13 MED FILL — TORSEMIDE 20 MG TABLET: 20 | 30 days supply | Qty: 210 | Fill #2

## 2018-10-24 ENCOUNTER — Ambulatory Visit (INDEPENDENT_AMBULATORY_CARE_PROVIDER_SITE_OTHER): Payer: Medicaid Other

## 2018-10-24 DIAGNOSIS — I428 Other cardiomyopathies: Secondary | ICD-10-CM | POA: Diagnosis not present

## 2018-10-24 DIAGNOSIS — I5022 Chronic systolic (congestive) heart failure: Secondary | ICD-10-CM

## 2018-10-26 NOTE — Progress Notes (Signed)
Remote ICD transmission.   

## 2018-11-22 ENCOUNTER — Other Ambulatory Visit: Payer: Self-pay | Admitting: Internal Medicine

## 2018-11-22 DIAGNOSIS — R55 Syncope and collapse: Secondary | ICD-10-CM

## 2018-11-22 MED FILL — TORSEMIDE 20 MG TABLET: 20 | 30 days supply | Qty: 210 | Fill #3

## 2018-11-22 MED FILL — LANTUS SOLOSTAR 100 UNITS/M: 100 | 30 days supply | Qty: 6 | Fill #4

## 2018-11-22 MED FILL — ENTRESTO 97 MG-103 MG TAB: 97-103 | 30 days supply | Qty: 60 | Fill #4

## 2018-11-22 MED FILL — BIDIL TABLET: 20-37.5 | 30 days supply | Qty: 90 | Fill #2

## 2018-11-22 MED FILL — SPIRONOLACTONE 25 MG TABS: 25 | 30 days supply | Qty: 15 | Fill #0

## 2018-11-22 MED FILL — CARVEDILOL 12.5 MG TABLET: 12.5 | 30 days supply | Qty: 60 | Fill #4

## 2018-11-23 ENCOUNTER — Other Ambulatory Visit (HOSPITAL_COMMUNITY): Payer: Self-pay

## 2018-11-23 DIAGNOSIS — R55 Syncope and collapse: Secondary | ICD-10-CM

## 2018-11-23 MED ORDER — AMIODARONE HCL 200 MG PO TABS: 200.0000 mg | ORAL_TABLET | Freq: Every day | ORAL | 0 refills | Status: DC

## 2018-11-23 MED FILL — AMIODARONE HCL 200 MG TAB: 200 | 30 days supply | Qty: 30 | Fill #0

## 2018-12-04 LAB — CUP PACEART REMOTE DEVICE CHECK
Battery Voltage: 2.97 V
Date Time Interrogation Session: 20191210205753
HIGH POWER IMPEDANCE MEASURED VALUE: 70 Ohm
HighPow Impedance: 53 Ohm
Implantable Lead Implant Date: 20141009
Implantable Lead Location: 753860
Implantable Lead Model: 7121
Lead Channel Impedance Value: 323 Ohm
Lead Channel Pacing Threshold Amplitude: 0.75 V
Lead Channel Pacing Threshold Pulse Width: 0.4 ms
Lead Channel Sensing Intrinsic Amplitude: 13.25 mV
Lead Channel Setting Pacing Pulse Width: 0.4 ms
MDC IDC MSMT BATTERY REMAINING LONGEVITY: 72 mo
MDC IDC MSMT LEADCHNL RV IMPEDANCE VALUE: 437 Ohm
MDC IDC MSMT LEADCHNL RV SENSING INTR AMPL: 13.25 mV
MDC IDC PG IMPLANT DT: 20141009
MDC IDC SET LEADCHNL RV PACING AMPLITUDE: 2.5 V
MDC IDC SET LEADCHNL RV SENSING SENSITIVITY: 0.45 mV
MDC IDC STAT BRADY RV PERCENT PACED: 0.06 %

## 2018-12-12 ENCOUNTER — Encounter (HOSPITAL_COMMUNITY): Payer: Medicaid Other | Admitting: Internal Medicine

## 2018-12-12 ENCOUNTER — Ambulatory Visit (HOSPITAL_COMMUNITY): Admission: RE | Admit: 2018-12-12 | Payer: Medicaid Other | Source: Ambulatory Visit

## 2019-01-05 ENCOUNTER — Other Ambulatory Visit (HOSPITAL_COMMUNITY): Payer: Self-pay | Admitting: Internal Medicine

## 2019-01-05 ENCOUNTER — Other Ambulatory Visit: Payer: Self-pay | Admitting: Internal Medicine

## 2019-01-05 DIAGNOSIS — E1122 Type 2 diabetes mellitus with diabetic chronic kidney disease: Secondary | ICD-10-CM

## 2019-01-05 DIAGNOSIS — N182 Chronic kidney disease, stage 2 (mild): Principal | ICD-10-CM

## 2019-01-05 MED FILL — ENTRESTO 97 MG-103 MG TAB: 97-103 | 30 days supply | Qty: 60 | Fill #5

## 2019-01-05 MED FILL — ATORVASTATIN 40 MG TABLET: 40 | 30 days supply | Qty: 30 | Fill #2

## 2019-01-05 MED FILL — CARVEDILOL 12.5 MG TABLET: 12.5 | 30 days supply | Qty: 60 | Fill #5

## 2019-01-05 MED FILL — BIDIL TABLET: 20-37.5 | 30 days supply | Qty: 90 | Fill #3

## 2019-01-08 MED FILL — TORSEMIDE 20 MG TABLET: 20 | 30 days supply | Qty: 210 | Fill #0

## 2019-01-23 ENCOUNTER — Ambulatory Visit (INDEPENDENT_AMBULATORY_CARE_PROVIDER_SITE_OTHER): Payer: Medicaid Other | Admitting: *Deleted

## 2019-01-23 DIAGNOSIS — I428 Other cardiomyopathies: Secondary | ICD-10-CM

## 2019-01-24 LAB — CUP PACEART REMOTE DEVICE CHECK
HIGH POWER IMPEDANCE MEASURED VALUE: 47 Ohm
HighPow Impedance: 58 Ohm
Implantable Lead Implant Date: 20141009
Implantable Lead Location: 753860
Implantable Lead Model: 7121
Implantable Pulse Generator Implant Date: 20141009
Lead Channel Pacing Threshold Amplitude: 0.875 V
Lead Channel Pacing Threshold Pulse Width: 0.4 ms
Lead Channel Sensing Intrinsic Amplitude: 11.375 mV
Lead Channel Setting Pacing Pulse Width: 0.4 ms
Lead Channel Setting Sensing Sensitivity: 0.45 mV
MDC IDC MSMT BATTERY REMAINING LONGEVITY: 77 mo
MDC IDC MSMT BATTERY VOLTAGE: 2.99 V
MDC IDC MSMT LEADCHNL RV IMPEDANCE VALUE: 285 Ohm
MDC IDC MSMT LEADCHNL RV IMPEDANCE VALUE: 399 Ohm
MDC IDC MSMT LEADCHNL RV SENSING INTR AMPL: 11.375 mV
MDC IDC SESS DTM: 20200310162208
MDC IDC SET LEADCHNL RV PACING AMPLITUDE: 2.5 V
MDC IDC STAT BRADY RV PERCENT PACED: 0.09 %

## 2019-01-31 ENCOUNTER — Encounter: Payer: Self-pay | Admitting: Cardiology

## 2019-01-31 NOTE — Progress Notes (Signed)
Remote ICD transmission.   

## 2019-02-05 ENCOUNTER — Other Ambulatory Visit (HOSPITAL_COMMUNITY): Payer: Self-pay | Admitting: Internal Medicine

## 2019-02-05 ENCOUNTER — Other Ambulatory Visit: Payer: Self-pay | Admitting: Internal Medicine

## 2019-02-05 DIAGNOSIS — N182 Chronic kidney disease, stage 2 (mild): Principal | ICD-10-CM

## 2019-02-05 DIAGNOSIS — E1122 Type 2 diabetes mellitus with diabetic chronic kidney disease: Secondary | ICD-10-CM

## 2019-02-06 MED FILL — TORSEMIDE 20 MG TABLET: 20 | 30 days supply | Qty: 210 | Fill #0

## 2019-02-06 MED FILL — BIDIL TABLET: 20-37.5 | 30 days supply | Qty: 90 | Fill #0

## 2019-02-07 ENCOUNTER — Other Ambulatory Visit (HOSPITAL_COMMUNITY): Payer: Self-pay | Admitting: *Deleted

## 2019-02-07 ENCOUNTER — Telehealth (HOSPITAL_COMMUNITY): Payer: Self-pay | Admitting: Vascular Surgery

## 2019-02-07 DIAGNOSIS — N182 Chronic kidney disease, stage 2 (mild): Secondary | ICD-10-CM

## 2019-02-07 DIAGNOSIS — R55 Syncope and collapse: Secondary | ICD-10-CM

## 2019-02-07 DIAGNOSIS — E1122 Type 2 diabetes mellitus with diabetic chronic kidney disease: Secondary | ICD-10-CM

## 2019-02-07 MED ORDER — POTASSIUM CHLORIDE CRYS ER 20 MEQ PO TBCR: 40.0000 meq | EXTENDED_RELEASE_TABLET | Freq: Two times a day (BID) | ORAL | 2 refills | Status: DC

## 2019-02-07 MED ORDER — SACUBITRIL-VALSARTAN 97-103 MG PO TABS: 1.0000 | ORAL_TABLET | Freq: Two times a day (BID) | ORAL | 11 refills | Status: DC

## 2019-02-07 MED ORDER — SPIRONOLACTONE 25 MG PO TABS: 12.5000 mg | ORAL_TABLET | Freq: Every day | ORAL | 3 refills | Status: DC

## 2019-02-07 MED ORDER — ATORVASTATIN CALCIUM 40 MG PO TABS: 40.0000 mg | ORAL_TABLET | Freq: Every day | ORAL | 2 refills | Status: DC

## 2019-02-07 MED ORDER — AMIODARONE HCL 200 MG PO TABS: 200.0000 mg | ORAL_TABLET | Freq: Every day | ORAL | 0 refills | Status: DC

## 2019-02-07 MED ORDER — TORSEMIDE 20 MG PO TABS: ORAL_TABLET | ORAL | 3 refills | Status: DC

## 2019-02-07 MED ORDER — ISOSORB DINITRATE-HYDRALAZINE 20-37.5 MG PO TABS: 1.0000 | ORAL_TABLET | Freq: Three times a day (TID) | ORAL | 3 refills | Status: DC

## 2019-02-07 MED ORDER — CARVEDILOL 12.5 MG PO TABS: 12.5000 mg | ORAL_TABLET | Freq: Two times a day (BID) | ORAL | 3 refills | Status: DC

## 2019-02-07 MED FILL — ATORVASTATIN 40 MG TABLET: 40 | 30 days supply | Qty: 30 | Fill #0

## 2019-02-07 MED FILL — SPIRONOLACTONE 25 MG TABS: 25 | 90 days supply | Qty: 45 | Fill #0

## 2019-02-07 MED FILL — AMIODARONE HCL 200 MG TAB: 200 | 30 days supply | Qty: 30 | Fill #0

## 2019-02-07 MED FILL — POTASSIUM CHLORIDE CRYS ER: 20 | 30 days supply | Qty: 120 | Fill #0

## 2019-02-07 MED FILL — CARVEDILOL 12.5 MG TABLET: 12.5 | 30 days supply | Qty: 60 | Fill #0

## 2019-02-07 NOTE — Telephone Encounter (Signed)
Called pt to reschedule 4/2 appt w/ echo w/ db, pt appt moved to 6/25, pt states he will need refills on his medications sent to [facility] OP pharm

## 2019-02-12 ENCOUNTER — Ambulatory Visit: Payer: Medicaid Other | Admitting: Nurse Practitioner

## 2019-02-13 ENCOUNTER — Telehealth (HOSPITAL_COMMUNITY): Payer: Self-pay

## 2019-02-13 NOTE — Telephone Encounter (Signed)
Sherryll Burger PA reenrollment request form faxed with DB signature

## 2019-02-14 MED FILL — ENTRESTO 97 MG-103 MG TAB: 97-103 | 30 days supply | Qty: 60 | Fill #0

## 2019-02-15 ENCOUNTER — Encounter (HOSPITAL_COMMUNITY): Payer: Medicaid Other | Admitting: Internal Medicine

## 2019-02-15 ENCOUNTER — Ambulatory Visit (HOSPITAL_COMMUNITY): Payer: Medicaid Other

## 2019-03-19 MED FILL — CARVEDILOL 12.5 MG TABLET: 12.5 | 30 days supply | Qty: 60 | Fill #1

## 2019-03-19 MED FILL — ENTRESTO 97 MG-103 MG TAB: 97-103 | 30 days supply | Qty: 60 | Fill #1

## 2019-03-19 MED FILL — TORSEMIDE 20 MG TABLET: 20 | 30 days supply | Qty: 210 | Fill #0

## 2019-03-19 MED FILL — ATORVASTATIN 40 MG TABLET: 40 | 30 days supply | Qty: 30 | Fill #1

## 2019-03-19 MED FILL — POTASSIUM CHLORIDE CRYS ER: 20 | 30 days supply | Qty: 120 | Fill #1

## 2019-03-30 ENCOUNTER — Other Ambulatory Visit: Payer: Self-pay

## 2019-03-30 ENCOUNTER — Ambulatory Visit: Payer: Medicaid Other | Attending: Nurse Practitioner | Admitting: Nurse Practitioner

## 2019-03-30 ENCOUNTER — Encounter: Payer: Self-pay | Admitting: Nurse Practitioner

## 2019-03-30 DIAGNOSIS — Z794 Long term (current) use of insulin: Secondary | ICD-10-CM

## 2019-03-30 DIAGNOSIS — E1122 Type 2 diabetes mellitus with diabetic chronic kidney disease: Secondary | ICD-10-CM | POA: Diagnosis not present

## 2019-03-30 DIAGNOSIS — I428 Other cardiomyopathies: Secondary | ICD-10-CM | POA: Diagnosis not present

## 2019-03-30 DIAGNOSIS — I130 Hypertensive heart and chronic kidney disease with heart failure and stage 1 through stage 4 chronic kidney disease, or unspecified chronic kidney disease: Secondary | ICD-10-CM | POA: Diagnosis not present

## 2019-03-30 DIAGNOSIS — E669 Obesity, unspecified: Secondary | ICD-10-CM | POA: Insufficient documentation

## 2019-03-30 DIAGNOSIS — N182 Chronic kidney disease, stage 2 (mild): Secondary | ICD-10-CM | POA: Insufficient documentation

## 2019-03-30 DIAGNOSIS — E1143 Type 2 diabetes mellitus with diabetic autonomic (poly)neuropathy: Secondary | ICD-10-CM | POA: Insufficient documentation

## 2019-03-30 DIAGNOSIS — F17210 Nicotine dependence, cigarettes, uncomplicated: Secondary | ICD-10-CM | POA: Diagnosis not present

## 2019-03-30 DIAGNOSIS — E0843 Diabetes mellitus due to underlying condition with diabetic autonomic (poly)neuropathy: Secondary | ICD-10-CM

## 2019-03-30 DIAGNOSIS — Z833 Family history of diabetes mellitus: Secondary | ICD-10-CM | POA: Insufficient documentation

## 2019-03-30 DIAGNOSIS — Z95810 Presence of automatic (implantable) cardiac defibrillator: Secondary | ICD-10-CM | POA: Insufficient documentation

## 2019-03-30 DIAGNOSIS — I472 Ventricular tachycardia, unspecified: Secondary | ICD-10-CM

## 2019-03-30 DIAGNOSIS — Z801 Family history of malignant neoplasm of trachea, bronchus and lung: Secondary | ICD-10-CM | POA: Diagnosis not present

## 2019-03-30 DIAGNOSIS — I5022 Chronic systolic (congestive) heart failure: Secondary | ICD-10-CM | POA: Diagnosis not present

## 2019-03-30 DIAGNOSIS — E1165 Type 2 diabetes mellitus with hyperglycemia: Secondary | ICD-10-CM | POA: Diagnosis not present

## 2019-03-30 DIAGNOSIS — Z8249 Family history of ischemic heart disease and other diseases of the circulatory system: Secondary | ICD-10-CM | POA: Insufficient documentation

## 2019-03-30 MED ORDER — METFORMIN HCL ER 500 MG PO TB24: 500.0000 mg | ORAL_TABLET | Freq: Two times a day (BID) | ORAL | 3 refills | Status: DC

## 2019-03-30 MED ORDER — SITAGLIPTIN PHOSPHATE 50 MG PO TABS: 50.0000 mg | ORAL_TABLET | Freq: Every day | ORAL | 0 refills | Status: DC

## 2019-03-30 MED ORDER — ACCU-CHEK GUIDE CONTROL VI LIQD: 1.0000 | Freq: Once | 0 refills | Status: DC | PRN

## 2019-03-30 MED ORDER — ACCU-CHEK FASTCLIX LANCET KIT: 1.0000 | PACK | Freq: Once | 0 refills | Status: AC

## 2019-03-30 MED ORDER — GLUCOSE BLOOD VI STRP: ORAL_STRIP | 12 refills | Status: DC

## 2019-03-30 MED ORDER — BASAGLAR KWIKPEN 100 UNIT/ML ~~LOC~~ SOPN: 30.0000 [IU] | PEN_INJECTOR | Freq: Every day | SUBCUTANEOUS | 3 refills | Status: DC

## 2019-03-30 MED ORDER — ACCU-CHEK FASTCLIX LANCETS MISC: 3 refills | Status: DC

## 2019-03-30 MED ORDER — ACCU-CHEK GUIDE ME W/DEVICE KIT: 1.0000 | PACK | Freq: Once | 0 refills | Status: AC

## 2019-03-30 MED ORDER — INSULIN PEN NEEDLE 32G X 4 MM MISC: 1.0000 "application " | Freq: Every day | 3 refills | Status: DC

## 2019-03-30 NOTE — Progress Notes (Signed)
Virtual Visit via Telephone Note Due to national recommendations of social distancing due to [name] 19, telehealth visit is felt to be most appropriate for this patient at this time.  I discussed the limitations, risks, security and privacy concerns of performing an evaluation and management service by telephone and the availability of in person appointments. I also discussed with the patient that there may be a patient responsible charge related to this service. The patient expressed understanding and agreed to proceed.    I connected with Douglas [name] on 03/30/19  at  2:08 PM EDT  EDT by telephone and verified that I am speaking with the correct person using two identifiers.   Consent I discussed the limitations, risks, security and privacy concerns of performing an evaluation and management service by telephone and the availability of in person appointments. I also discussed with the patient that there may be a patient responsible charge related to this service. The patient expressed understanding and agreed to proceed.   Location of Patient: Private Residence    Location of Provider: [place] and [employer] Office    Persons participating in Telemedicine visit: [name] FNP-BC [place]    History of Present Illness: Telemedicine visit for: Establish care PMH: HTN, NICM, cCHF s/p ICD, MDD, tobacco use, cocaine use, T2DM. Needs to follow up with Cardiology. ECHO 08/19/2017 EF 40-45%. - NYHA II-III  - Volume status mildly elevated on exam.  Past Medical History:  Diagnosis Date  . Depression, major, single episode 01/14/2017  . Diabetes mellitus ([place])   . ED (erectile dysfunction)   . History of syncope 01/29/2015  . Hypertension   . 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 Asymptomatic.   Marland Kitchen Nonischemic cardiomyopathy ([place])    a.  echo 4/06: EF 30%, mild to mod MR, mild RAE, inf HK, lat HK , ant HK;     b.  cath 4/06: no CAD, EF 20-25%  . NSVT (nonsustained ventricular tachycardia) ([place])   . Obesity   . Systolic CHF, chronic ([place])    EF 11/17 25-30%, s/p ICD     DM TYPE 2 Diagnosed with Diabetes Mellitus Type 2  "a few years ago".  Has not monitored his blood glucose levels in a few months. States battery died on his machine so he has not been checking. Stopped taking metformin 500 mg Daily but can't recall why he stopped taking it. States he "felt funny". Denies any hypoglycemic symptoms. Endorses hyperglycemic symptoms of intermittent blurred vision and neuropathy. He does continue to smoke. I have encouraged him to restart metformin and will continue to monitor for side effects. Will add Januvia today.  Other medications include basaglar 30 units. He denies any hypoglycemic symptoms.  Lab Results  Component Value Date   HGBA1C 13.6 06/08/2017     Essential Hypertension He does not monitor his blood pressure at home. Current medications include  entresto 97-103 mg BID, spironolactone 50 mg daily, torsemide 20 mg daily, Bidil 20-37.5 mg TID.  BP Readings from Last 3 Encounters:  09/20/18 (!) 141/97  08/29/18 110/76  07/05/18 138/75     Past Surgical History:  Procedure Laterality Date  . CARDIAC CATHETERIZATION  09/2011; 02/2013; 04/2013  . CARDIAC DEFIBRILLATOR PLACEMENT  08/23/2013  . IMPLANTABLE CARDIOVERTER DEFIBRILLATOR IMPLANT N/A 08/23/2013   Procedure: IMPLANTABLE CARDIOVERTER DEFIBRILLATOR IMPLANT;  Surgeon: Deboraha Sprang, MD;  Location: [facility] CATH LAB;  Service: Cardiovascular;  Laterality: N/A;  . LEFT AND RIGHT  HEART CATHETERIZATION WITH CORONARY ANGIOGRAM N/A 09/20/2011   Procedure: LEFT AND RIGHT HEART CATHETERIZATION WITH CORONARY ANGIOGRAM;  Surgeon: Jolaine Artist, MD;  Location: [facility] CATH LAB;  Service: Cardiovascular;  Laterality: N/A;  . MULTIPLE EXTRACTIONS WITH ALVEOLOPLASTY N/A 01/26/2013   Procedure:  EXTRACION  TOOTH # 19 WITH ALVEOLOPLASTY;  Surgeon: Lenn Cal, DDS;  Location: [place];  Service: Oral Surgery;  Laterality: N/A;  . RIGHT HEART CATHETERIZATION N/A 02/22/2013   Procedure: RIGHT HEART CATH;  Surgeon: Jolaine Artist, MD;  Location: [facility] CATH LAB;  Service: Cardiovascular;  Laterality: N/A;  . RIGHT HEART CATHETERIZATION N/A 05/03/2013   Procedure: RIGHT HEART CATH;  Surgeon: Jolaine Artist, MD;  Location: [facility] CATH LAB;  Service: Cardiovascular;  Laterality: N/A;    Family History  Problem Relation Age of Onset  . Coronary artery disease Mother 54       s/p PCI  . Lung cancer Father   . Diabetes type II Maternal Uncle   . Coronary artery disease Maternal Uncle   . Stroke Neg Hx   . Heart attack Neg Hx     Social History   Socioeconomic History  . Marital status: Legally Separated    Spouse name: Not on file  . Number of children: 5  . Years of education: 51  . Highest education level: Not on file  Occupational History  . Occupation: [employer]    Comment: part-time  Social Needs  . Financial resource strain: Somewhat hard  . Food insecurity:    Worry: Sometimes true    Inability: Sometimes true  . Transportation needs:    Medical: Not on file    Non-medical: Not on file  Tobacco Use  . Smoking status: Current Every Day Smoker    Packs/day: 0.25    Years: 8.00    Pack years: 2.00    Types: Cigarettes  . Smokeless tobacco: Never Used  . Tobacco comment: ~3 cigarettes a day  Substance and Sexual Activity  . Alcohol use: Yes    Alcohol/week: 0.0 standard drinks    Comment: 2x week.   . Drug use: Not Currently    Types: Cocaine    Comment: 08/10/17 - last use one week ago  . Sexual activity: Yes  Lifestyle  . Physical activity:    Days per week: Not on file    Minutes per session: Not on file  . Stress: Not on file  Relationships  . Social connections:    Talks on phone: Not on file    Gets together: Not on file    Attends religious service: Not on file    Active member of club or organization:  Not on file    Attends meetings of clubs or organizations: Not on file    Relationship status: Not on file  Other Topics Concern  . Not on file  Social History Narrative   Referred to PCP- appointment made for 09/04/18 at 9:30am at [facility]      Provided with food pantry and free meal list- patient reported sometimes having issues paying for food but reports he does received food stamps and works part time- he does not pay for housing as he lives with his sister.      Patient uses his mother's car and reports no issues getting transport to to medical appointments.     Observations/Objective: Awake, alert and oriented x 3   Review of Systems  Constitutional: Negative for fever, malaise/fatigue and weight loss.  HENT: Negative.  Negative for nosebleeds.   Eyes: Negative.  Negative for blurred vision, double vision and photophobia.  Respiratory: Negative.  Negative for cough and shortness of breath.   Cardiovascular: Negative.  Negative for chest pain, palpitations and leg swelling.  Gastrointestinal: Negative.  Negative for heartburn, nausea and vomiting.  Musculoskeletal: Negative.  Negative for myalgias.  Neurological: Negative.  Negative for dizziness, focal weakness, seizures and headaches.  Psychiatric/Behavioral: Negative.  Negative for suicidal ideas.    Assessment and Plan: Douglas [name] was seen today for new patient (initial visit).  Diagnoses and all orders for this visit:  Diabetes mellitus due to underlying condition with diabetic autonomic neuropathy, with long-term current use of insulin ([place]) -     Lancets Misc. (ACCU-CHEK FASTCLIX LANCET) KIT; 1 kit by Does not apply route once for 1 dose. -     glucose blood (ACCU-CHEK GUIDE) test strip; Use as instructed. Check blood glucose by fingerstick twice per day. E11.8 -     Blood Glucose Monitoring Suppl (ACCU-CHEK GUIDE ME) w/Device KIT; 1 each by Does not apply route once for 1 dose. -     Blood Glucose Calibration (ACCU-CHEK  GUIDE CONTROL) LIQD; 1 each by In Vitro route once as needed for up to 1 dose. -     Accu-Chek FastClix Lancets MISC; Use as instructed. Inject into the skin twice daily E11.8 -     Hemoglobin A1c -     Lipid panel -     CBC -     sitaGLIPtin (JANUVIA) 50 MG tablet; Take 1 tablet (50 mg total) by mouth daily for 30 days. -     metFORMIN (GLUCOPHAGE-XR) 500 MG 24 hr tablet; Take 1 tablet (500 mg total) by mouth 2 (two) times daily with a meal for 30 days. -     Insulin Glargine (BASAGLAR KWIKPEN) 100 UNIT/ML SOPN; Inject 0.3 mLs (30 Units total) into the skin daily for 30 days. Stop taking Lantus -     Insulin Pen Needle (BD PEN NEEDLE NANO U/F) 32G X 4 MM MISC; Inject 1 application into the skin at bedtime. The patient is insulin requiring, ICD 10 code E11.9. The patient injects 1 times per day.  Diabetes mellitus with stage 2 chronic kidney disease (HCC) -     CMP14+EGFR -     Insulin Pen Needle (BD PEN NEEDLE NANO U/F) 32G X 4 MM MISC; Inject 1 application into the skin at bedtime. The patient is insulin requiring, ICD 10 code E11.9. The patient injects 1 times per day.  Chronic systolic heart failure (HCC) Continue medications as prescribed STOP SMOKING and using illicit substances Do the following things EVERYDAY: 1. Weigh yourself in the morning before breakfast. Write it down and keep it in a log. 2. Take your medicines as prescribed 3. Eat low salt foods-Limit salt (sodium) to 2000 mg per day.  4. Stay as active as you can everyday 5. Limit all fluids for the day to less than 2 liters   VT (ventricular tachycardia) (HCC) Follow up with cardiology as instructed     Follow Up Instructions Return in about 4 weeks (around 04/27/2019).     I discussed the assessment and treatment plan with the patient. The patient was provided an opportunity to ask questions and all were answered. The patient agreed with the plan and demonstrated an understanding of the instructions.   The patient  was advised to call back or seek an in-person evaluation if the symptoms worsen or  if the condition fails to improve as anticipated.  I provided 41 minutes of non-face-to-face time during this encounter including median intraservice time, reviewing previous notes, labs, imaging, medications and explaining diagnosis and management.  Gildardo Pounds, FNP-BC

## 2019-04-02 ENCOUNTER — Other Ambulatory Visit: Payer: Medicaid Other

## 2019-04-17 MED FILL — CARVEDILOL 12.5 MG TABLET: 12.5 | 30 days supply | Qty: 60 | Fill #2

## 2019-04-17 MED FILL — ENTRESTO 97 MG-103 MG TAB: 97-103 | 30 days supply | Qty: 60 | Fill #2

## 2019-04-17 MED FILL — TORSEMIDE 20 MG TABLET: 20 | 30 days supply | Qty: 210 | Fill #1

## 2019-04-17 MED FILL — BIDIL TABLET: 20-37.5 | 30 days supply | Qty: 90 | Fill #1

## 2019-04-17 MED FILL — ATORVASTATIN 40 MG TABLET: 40 | 30 days supply | Qty: 30 | Fill #2

## 2019-04-23 ENCOUNTER — Telehealth (HOSPITAL_COMMUNITY): Payer: Self-pay | Admitting: Vascular Surgery

## 2019-04-23 NOTE — Telephone Encounter (Signed)
Left pt message, 05/10/19 appt w/ db will be canceled , asked pt to keep echo 6//25/20, pt will be put on waitlist

## 2019-04-24 ENCOUNTER — Ambulatory Visit (INDEPENDENT_AMBULATORY_CARE_PROVIDER_SITE_OTHER): Payer: Medicaid Other | Admitting: *Deleted

## 2019-04-24 DIAGNOSIS — I428 Other cardiomyopathies: Secondary | ICD-10-CM

## 2019-04-24 DIAGNOSIS — I5022 Chronic systolic (congestive) heart failure: Secondary | ICD-10-CM

## 2019-04-25 LAB — CUP PACEART REMOTE DEVICE CHECK
Battery Remaining Longevity: 75 mo
Battery Voltage: 2.96 V
Brady Statistic RV Percent Paced: 0.02 %
Date Time Interrogation Session: 20200610112907
HighPow Impedance: 52 Ohm
HighPow Impedance: 71 Ohm
Implantable Lead Implant Date: 20141009
Implantable Lead Location: 753860
Implantable Lead Model: 7121
Implantable Pulse Generator Implant Date: 20141009
Lead Channel Impedance Value: 323 Ohm
Lead Channel Impedance Value: 437 Ohm
Lead Channel Pacing Threshold Amplitude: 0.875 V
Lead Channel Pacing Threshold Pulse Width: 0.4 ms
Lead Channel Sensing Intrinsic Amplitude: 10.625 mV
Lead Channel Sensing Intrinsic Amplitude: 10.625 mV
Lead Channel Setting Pacing Amplitude: 2.5 V
Lead Channel Setting Pacing Pulse Width: 0.4 ms
Lead Channel Setting Sensing Sensitivity: 0.45 mV

## 2019-04-30 ENCOUNTER — Other Ambulatory Visit: Payer: Self-pay

## 2019-04-30 ENCOUNTER — Ambulatory Visit: Payer: Medicaid Other | Attending: Nurse Practitioner | Admitting: Nurse Practitioner

## 2019-05-03 NOTE — Progress Notes (Signed)
Remote ICD transmission.   

## 2019-05-10 ENCOUNTER — Other Ambulatory Visit: Payer: Self-pay

## 2019-05-10 ENCOUNTER — Ambulatory Visit (HOSPITAL_COMMUNITY)
Admission: RE | Admit: 2019-05-10 | Discharge: 2019-05-10 | Disposition: A | Discharge status: Home / Self Care | Payer: Medicaid Other | Source: Ambulatory Visit | Attending: Cardiology | Admitting: Cardiology

## 2019-05-10 ENCOUNTER — Encounter (HOSPITAL_COMMUNITY): Payer: Medicaid Other | Admitting: Internal Medicine

## 2019-05-10 DIAGNOSIS — I5042 Chronic combined systolic (congestive) and diastolic (congestive) heart failure: Secondary | ICD-10-CM

## 2019-05-10 DIAGNOSIS — E785 Hyperlipidemia, unspecified: Secondary | ICD-10-CM | POA: Insufficient documentation

## 2019-05-10 DIAGNOSIS — E119 Type 2 diabetes mellitus without complications: Secondary | ICD-10-CM | POA: Diagnosis not present

## 2019-05-10 DIAGNOSIS — E669 Obesity, unspecified: Secondary | ICD-10-CM | POA: Insufficient documentation

## 2019-05-10 DIAGNOSIS — I472 Ventricular tachycardia: Secondary | ICD-10-CM | POA: Insufficient documentation

## 2019-05-10 DIAGNOSIS — F1910 Other psychoactive substance abuse, uncomplicated: Secondary | ICD-10-CM | POA: Diagnosis not present

## 2019-05-10 DIAGNOSIS — R079 Chest pain, unspecified: Secondary | ICD-10-CM | POA: Insufficient documentation

## 2019-05-10 DIAGNOSIS — Z720 Tobacco use: Secondary | ICD-10-CM | POA: Insufficient documentation

## 2019-05-24 ENCOUNTER — Other Ambulatory Visit (HOSPITAL_COMMUNITY): Payer: Self-pay | Admitting: Adult Health

## 2019-05-24 DIAGNOSIS — E1122 Type 2 diabetes mellitus with diabetic chronic kidney disease: Secondary | ICD-10-CM

## 2019-05-24 MED FILL — TORSEMIDE 20 MG TABLET: 20 | 30 days supply | Qty: 210 | Fill #2

## 2019-05-24 MED FILL — ENTRESTO 97 MG-103 MG TAB: 97-103 | 30 days supply | Qty: 60 | Fill #3

## 2019-05-24 MED FILL — CARVEDILOL 12.5 MG TABLET: 12.5 | 30 days supply | Qty: 60 | Fill #3

## 2019-05-25 MED FILL — ATORVASTATIN 40 MG TABLET: 40 | 30 days supply | Qty: 30 | Fill #0

## 2019-06-04 ENCOUNTER — Ambulatory Visit: Payer: Medicaid Other | Attending: Nurse Practitioner | Admitting: Nurse Practitioner

## 2019-06-04 ENCOUNTER — Encounter: Payer: Self-pay | Admitting: Nurse Practitioner

## 2019-06-04 ENCOUNTER — Other Ambulatory Visit: Payer: Self-pay

## 2019-06-04 DIAGNOSIS — I472 Ventricular tachycardia: Secondary | ICD-10-CM | POA: Diagnosis not present

## 2019-06-04 DIAGNOSIS — Z8249 Family history of ischemic heart disease and other diseases of the circulatory system: Secondary | ICD-10-CM | POA: Insufficient documentation

## 2019-06-04 DIAGNOSIS — Z801 Family history of malignant neoplasm of trachea, bronchus and lung: Secondary | ICD-10-CM | POA: Insufficient documentation

## 2019-06-04 DIAGNOSIS — E1122 Type 2 diabetes mellitus with diabetic chronic kidney disease: Secondary | ICD-10-CM | POA: Diagnosis not present

## 2019-06-04 DIAGNOSIS — Z95810 Presence of automatic (implantable) cardiac defibrillator: Secondary | ICD-10-CM | POA: Diagnosis not present

## 2019-06-04 DIAGNOSIS — F17210 Nicotine dependence, cigarettes, uncomplicated: Secondary | ICD-10-CM | POA: Insufficient documentation

## 2019-06-04 DIAGNOSIS — I110 Hypertensive heart disease with heart failure: Secondary | ICD-10-CM | POA: Insufficient documentation

## 2019-06-04 DIAGNOSIS — Z9119 Patient's noncompliance with other medical treatment and regimen: Secondary | ICD-10-CM | POA: Diagnosis not present

## 2019-06-04 DIAGNOSIS — I5022 Chronic systolic (congestive) heart failure: Secondary | ICD-10-CM | POA: Diagnosis not present

## 2019-06-04 DIAGNOSIS — E669 Obesity, unspecified: Secondary | ICD-10-CM | POA: Diagnosis not present

## 2019-06-04 DIAGNOSIS — E0843 Diabetes mellitus due to underlying condition with diabetic autonomic (poly)neuropathy: Secondary | ICD-10-CM | POA: Diagnosis not present

## 2019-06-04 DIAGNOSIS — I428 Other cardiomyopathies: Secondary | ICD-10-CM | POA: Insufficient documentation

## 2019-06-04 DIAGNOSIS — N182 Chronic kidney disease, stage 2 (mild): Secondary | ICD-10-CM | POA: Diagnosis not present

## 2019-06-04 DIAGNOSIS — Z833 Family history of diabetes mellitus: Secondary | ICD-10-CM | POA: Diagnosis not present

## 2019-06-04 DIAGNOSIS — F1411 Cocaine abuse, in remission: Secondary | ICD-10-CM | POA: Insufficient documentation

## 2019-06-04 DIAGNOSIS — Z794 Long term (current) use of insulin: Secondary | ICD-10-CM

## 2019-06-04 NOTE — Progress Notes (Signed)
Virtual Visit via Telephone Note Due to national recommendations of social distancing due to COVID 19, telehealth visit is felt to be most appropriate for this patient at this time.  I discussed the limitations, risks, security and privacy concerns of performing an evaluation and management service by telephone and the availability of in person appointments. I also discussed with the patient that there may be a patient responsible charge related to this service. The patient expressed understanding and agreed to proceed.    I connected with [name] on 06/04/19  at   2:10 PM EDT  EDT by telephone and verified that I am speaking with the correct person using two identifiers.   Consent I discussed the limitations, risks, security and privacy concerns of performing an evaluation and management service by telephone and the availability of in person appointments. I also discussed with the patient that there may be a patient responsible charge related to this service. The patient expressed understanding and agreed to proceed.   Location of Patient: Private Residence   Location of Provider: [facility]    Persons participating in Telemedicine visit: Bertram Denver FNP-BC YY [place] CMA Jusitn Mardene Celeste    History of Present Illness: Telemedicine visit for: Follow up HTN, HL, obesity, smoker, cocaine abuse,  NICM and chronic systolic heart failure. He is S/P Medtronic ICD due to NICM. Management has been complicated by severe non-compliance. EF 25-30%  Still waiting on him to come into the office for blood work. I have been waiting since May for this. He has also not been taking his medications as prescribed. Not picking them up from the pharmacy when ordered.  He is not aware of most of the name of his medications. Seems disengaged today when discussing his non adherence.  He has not been monitoring his blood glucose levels at home.  States he does not have  a battery in his meter. He has canceled several appointments with his cardiologist as well as had several no shows last year. Stating to me today that "something came up".   I reinforced his significant health issues with him today and how imperative it is that he keep his appointments, monitor excessive sodium in his diet,  and take his medications as prescribed. He has had several no shows with this office. He has not seen cardiology since 08-29-2018. Will obtain A1C as well as other labs that have been pending at next office visit in a few weeks. He is supposed to bring his meter.  He does not monitor his blood pressure at home. Will order meter through his insurance plan. STILL SMOKING!!!! Lab Results  Component Value Date   HGBA1C 13.6 06/08/2017     Past Medical History:  Diagnosis Date  . Depression, major, single episode 01/14/2017  . Diabetes mellitus (HCC)   . ED (erectile dysfunction)   . History of syncope 01/29/2015  . Hypertension   . 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 Asymptomatic.   Marland Kitchen Nonischemic cardiomyopathy (HCC)    a.  echo 4/06: EF 30%, mild to mod MR, mild RAE, inf HK, lat HK , ant HK;    b.  cath 4/06: no CAD, EF 20-25%  . NSVT (nonsustained ventricular tachycardia) (HCC)   . Obesity   . Systolic CHF, chronic (HCC)    EF 11/17 25-30%, s/p ICD    Past Surgical History:  Procedure Laterality Date  . CARDIAC CATHETERIZATION  09/2011; 02/2013; 04/2013  .  CARDIAC DEFIBRILLATOR PLACEMENT  08/23/2013  . IMPLANTABLE CARDIOVERTER DEFIBRILLATOR IMPLANT N/A 08/23/2013   Procedure: IMPLANTABLE CARDIOVERTER DEFIBRILLATOR IMPLANT;  Surgeon: Duke Salvia, MD;  Location: [facility] CATH LAB;  Service: Cardiovascular;  Laterality: N/A;  . LEFT AND RIGHT HEART CATHETERIZATION WITH CORONARY ANGIOGRAM N/A 09/20/2011   Procedure: LEFT AND RIGHT HEART CATHETERIZATION WITH CORONARY ANGIOGRAM;  Surgeon: Dolores Patty, MD;  Location: [facility] CATH LAB;   Service: Cardiovascular;  Laterality: N/A;  . MULTIPLE EXTRACTIONS WITH ALVEOLOPLASTY N/A 01/26/2013   Procedure:  EXTRACION  TOOTH # 19 WITH ALVEOLOPLASTY;  Surgeon: Charlynne Pander, DDS;  Location: [facility] OR;  Service: Oral Surgery;  Laterality: N/A;  . RIGHT HEART CATHETERIZATION N/A 02/22/2013   Procedure: RIGHT HEART CATH;  Surgeon: Dolores Patty, MD;  Location: [facility] CATH LAB;  Service: Cardiovascular;  Laterality: N/A;  . RIGHT HEART CATHETERIZATION N/A 05/03/2013   Procedure: RIGHT HEART CATH;  Surgeon: Dolores Patty, MD;  Location: [facility] CATH LAB;  Service: Cardiovascular;  Laterality: N/A;    Family History  Problem Relation Age of Onset  . Coronary artery disease Mother 31       s/p PCI  . Lung cancer Father   . Diabetes type II Maternal Uncle   . Coronary artery disease Maternal Uncle   . Stroke Neg Hx   . Heart attack Neg Hx     Social History   Socioeconomic History  . Marital status: Legally Separated    Spouse name: Not on file  . Number of children: 5  . Years of education: 67  . Highest education level: Not on file  Occupational History  . Occupation: [employer]    Comment: part-time  Social Needs  . Financial resource strain: Somewhat hard  . Food insecurity    Worry: Sometimes true    Inability: Sometimes true  . Transportation needs    Medical: Not on file    Non-medical: Not on file  Tobacco Use  . Smoking status: Current Every Day Smoker    Packs/day: 0.25    Years: 8.00    Pack years: 2.00    Types: Cigarettes  . Smokeless tobacco: Never Used  . Tobacco comment: ~3 cigarettes a day  Substance and Sexual Activity  . Alcohol use: Yes    Alcohol/week: 0.0 standard drinks    Comment: 2x week.   . Drug use: Not Currently    Types: Cocaine    Comment: 08/10/17 - last use one week ago  . Sexual activity: Yes  Lifestyle  . Physical activity    Days per week: Not on file    Minutes per session: Not on file  . Stress: Not on file  Relationships   . Social Musician on phone: Not on file    Gets together: Not on file    Attends religious service: Not on file    Active member of club or organization: Not on file    Attends meetings of clubs or organizations: Not on file    Relationship status: Not on file  Other Topics Concern  . Not on file  Social History Narrative   Referred to PCP- appointment made for 09/04/18 at 9:30am at [facility]      Provided with food pantry and free meal list- patient reported sometimes having issues paying for food but reports he does received food stamps and works part time- he does not pay for housing as he lives with his sister.  Patient uses his mother's car and reports no issues getting transport to to medical appointments.     Observations/Objective: Awake, alert and oriented x 3   Review of Systems  Constitutional: Negative for fever, malaise/fatigue and weight loss.  HENT: Negative.  Negative for nosebleeds.   Eyes: Negative.  Negative for blurred vision, double vision and photophobia.  Respiratory: Positive for shortness of breath (with increased activity). Negative for cough and wheezing.   Cardiovascular: Negative.  Negative for chest pain, palpitations and leg swelling.  Gastrointestinal: Negative.  Negative for heartburn, nausea and vomiting.  Musculoskeletal: Negative.  Negative for myalgias.  Neurological: Negative.  Negative for dizziness, focal weakness, seizures and headaches.  Psychiatric/Behavioral: Negative.  Negative for suicidal ideas.    Assessment and Plan: There are no diagnoses linked to this encounter.   Follow Up Instructions No follow-ups on file.     I discussed the assessment and treatment plan with the patient. The patient was provided an opportunity to ask questions and all were answered. The patient agreed with the plan and demonstrated an understanding of the instructions.   The patient was advised to call back or seek an in-person  evaluation if the symptoms worsen or if the condition fails to improve as anticipated.  I provided 24 minutes of non-face-to-face time during this encounter including median intraservice time, reviewing previous notes, labs, imaging, medications and explaining diagnosis and management.  Gildardo Pounds, FNP-BC

## 2019-06-09 ENCOUNTER — Encounter: Payer: Self-pay | Admitting: Nurse Practitioner

## 2019-06-09 MED ORDER — BD PEN NEEDLE NANO U/F 32G X 4 MM MISC: 1.0000 "application " | Freq: Every day | 3 refills | Status: DC

## 2019-06-09 MED ORDER — BASAGLAR KWIKPEN 100 UNIT/ML ~~LOC~~ SOPN: 30.0000 [IU] | PEN_INJECTOR | Freq: Every day | SUBCUTANEOUS | 3 refills | Status: DC

## 2019-06-09 MED ORDER — SPIRONOLACTONE 25 MG PO TABS: 12.5000 mg | ORAL_TABLET | Freq: Every day | ORAL | 3 refills | Status: DC

## 2019-06-09 MED ORDER — SITAGLIPTIN PHOSPHATE 50 MG PO TABS: 50.0000 mg | ORAL_TABLET | Freq: Every day | ORAL | 2 refills | Status: DC

## 2019-06-09 MED ORDER — BIDIL 20-37.5 MG PO TABS: 1.0000 | ORAL_TABLET | Freq: Three times a day (TID) | ORAL | 3 refills | Status: DC

## 2019-06-09 MED ORDER — BLOOD PRESSURE MONITOR DEVI: 0 refills | Status: DC

## 2019-06-11 ENCOUNTER — Ambulatory Visit: Payer: Medicaid Other | Attending: Family Medicine

## 2019-06-11 ENCOUNTER — Other Ambulatory Visit: Payer: Self-pay

## 2019-06-11 DIAGNOSIS — N182 Chronic kidney disease, stage 2 (mild): Secondary | ICD-10-CM | POA: Diagnosis not present

## 2019-06-11 DIAGNOSIS — Z794 Long term (current) use of insulin: Secondary | ICD-10-CM | POA: Diagnosis not present

## 2019-06-11 DIAGNOSIS — I5042 Chronic combined systolic (congestive) and diastolic (congestive) heart failure: Secondary | ICD-10-CM

## 2019-06-11 DIAGNOSIS — E0843 Diabetes mellitus due to underlying condition with diabetic autonomic (poly)neuropathy: Secondary | ICD-10-CM | POA: Diagnosis not present

## 2019-06-11 DIAGNOSIS — E1122 Type 2 diabetes mellitus with diabetic chronic kidney disease: Secondary | ICD-10-CM | POA: Diagnosis not present

## 2019-06-11 MED FILL — TRUEPLUS PEN NDL 31G X 1/4: 31G X 6 MM | 90 days supply | Qty: 100 | Fill #0

## 2019-06-11 MED FILL — SPIRONOLACTONE 25 MG TABLET: 25 | 90 days supply | Qty: 45 | Fill #0

## 2019-06-11 MED FILL — TRUEPLUS PEN NDL 31G X 1/4": 31G X 6 MM | 90 days supply | Qty: 100 | Fill #0

## 2019-06-11 MED FILL — BIDIL TABLET: 20-37.5 | 30 days supply | Qty: 90 | Fill #0

## 2019-06-12 LAB — CMP14+EGFR
ALT: 21 IU/L (ref 0–44)
AST: 14 IU/L (ref 0–40)
Albumin/Globulin Ratio: 1.3 (ref 1.2–2.2)
Albumin: 3.9 g/dL — ABNORMAL LOW (ref 4.0–5.0)
Alkaline Phosphatase: 101 IU/L (ref 39–117)
BUN/Creatinine Ratio: 14 (ref 9–20)
BUN: 18 mg/dL (ref 6–24)
Bilirubin Total: 0.5 mg/dL (ref 0.0–1.2)
CO2: 24 mmol/L (ref 20–29)
Calcium: 8.7 mg/dL (ref 8.7–10.2)
Chloride: 97 mmol/L (ref 96–106)
Creatinine, Ser: 1.3 mg/dL — ABNORMAL HIGH (ref 0.76–1.27)
GFR calc Af Amer: 77 mL/min/{1.73_m2} (ref >59)
GFR calc non Af Amer: 66 mL/min/{1.73_m2} (ref >59)
Globulin, Total: 3 g/dL (ref 1.5–4.5)
Glucose: 338 mg/dL — ABNORMAL HIGH (ref 65–99)
Potassium: 3.9 mmol/L (ref 3.5–5.2)
Sodium: 139 mmol/L (ref 134–144)
Total Protein: 6.9 g/dL (ref 6.0–8.5)

## 2019-06-12 LAB — LIPID PANEL
Chol/HDL Ratio: 6.6 ratio — ABNORMAL HIGH (ref 0.0–5.0)
Cholesterol, Total: 257 mg/dL — ABNORMAL HIGH (ref 100–199)
HDL: 39 mg/dL — ABNORMAL LOW (ref >39)
LDL Calculated: 164 mg/dL — ABNORMAL HIGH (ref 0–99)
Triglycerides: 270 mg/dL — ABNORMAL HIGH (ref 0–149)
VLDL Cholesterol Cal: 54 mg/dL — ABNORMAL HIGH (ref 5–40)

## 2019-06-12 LAB — CBC
Hematocrit: 40.6 % (ref 37.5–51.0)
Hemoglobin: 13.6 g/dL (ref 13.0–17.7)
MCH: 29.4 pg (ref 26.6–33.0)
MCHC: 33.5 g/dL (ref 31.5–35.7)
MCV: 88 fL (ref 79–97)
Platelets: 227 10*3/uL (ref 150–450)
RBC: 4.62 x10E6/uL (ref 4.14–5.80)
RDW: 12.9 % (ref 11.6–15.4)
WBC: 8 10*3/uL (ref 3.4–10.8)

## 2019-06-12 LAB — HEMOGLOBIN A1C
Est. average glucose Bld gHb Est-mCnc: 275 mg/dL
Hgb A1c MFr Bld: 11.2 % — ABNORMAL HIGH (ref 4.8–5.6)

## 2019-06-15 ENCOUNTER — Other Ambulatory Visit: Payer: Self-pay | Admitting: Nurse Practitioner

## 2019-06-15 MED ORDER — INSULIN GLARGINE 100 UNIT/ML SOLOSTAR PEN: 30.0000 [IU] | PEN_INJECTOR | Freq: Every day | SUBCUTANEOUS | 11 refills | Status: DC

## 2019-06-15 MED FILL — ACCU-CHEK GUIDE MONITOR SYS: W/DEVICE | 30 days supply | Qty: 1 | Fill #0

## 2019-06-15 MED FILL — ACCU-CHEK FASTCLIX LANCETS: 51 days supply | Qty: 102 | Fill #0

## 2019-06-15 MED FILL — ATORVASTATIN CALCIUM 40 MG: 40 | 30 days supply | Qty: 30 | Fill #0

## 2019-06-15 MED FILL — ACCU-CHEK GUIDE TEST STRIP: 50 days supply | Qty: 100 | Fill #0

## 2019-06-15 MED FILL — LANTUS SOLOSTAR 100 UNITS/M: 100 | 30 days supply | Qty: 15 | Fill #0

## 2019-06-27 MED FILL — TORSEMIDE 20 MG TABLET: 20 | 30 days supply | Qty: 210 | Fill #0

## 2019-06-27 MED FILL — POTASSIUM CL ER 20 MEQ TAB: 20 | 30 days supply | Qty: 120 | Fill #0

## 2019-06-27 MED FILL — JANUVIA 50 MG TABLET: 50 | 30 days supply | Qty: 30 | Fill #0

## 2019-06-27 MED FILL — ENTRESTO 97 MG-103 MG TAB: 97-103 | 30 days supply | Qty: 60 | Fill #0

## 2019-07-03 ENCOUNTER — Ambulatory Visit: Payer: Medicaid Other | Attending: Family Medicine | Admitting: Pharmacist

## 2019-07-03 ENCOUNTER — Other Ambulatory Visit: Payer: Self-pay

## 2019-07-03 DIAGNOSIS — I10 Essential (primary) hypertension: Secondary | ICD-10-CM | POA: Diagnosis not present

## 2019-07-03 DIAGNOSIS — Z8249 Family history of ischemic heart disease and other diseases of the circulatory system: Secondary | ICD-10-CM | POA: Diagnosis not present

## 2019-07-03 DIAGNOSIS — E1122 Type 2 diabetes mellitus with diabetic chronic kidney disease: Secondary | ICD-10-CM | POA: Diagnosis not present

## 2019-07-03 DIAGNOSIS — E785 Hyperlipidemia, unspecified: Secondary | ICD-10-CM | POA: Diagnosis not present

## 2019-07-03 DIAGNOSIS — Z833 Family history of diabetes mellitus: Secondary | ICD-10-CM | POA: Diagnosis not present

## 2019-07-03 DIAGNOSIS — F17210 Nicotine dependence, cigarettes, uncomplicated: Secondary | ICD-10-CM | POA: Insufficient documentation

## 2019-07-03 DIAGNOSIS — E119 Type 2 diabetes mellitus without complications: Secondary | ICD-10-CM | POA: Insufficient documentation

## 2019-07-03 DIAGNOSIS — N182 Chronic kidney disease, stage 2 (mild): Secondary | ICD-10-CM

## 2019-07-03 LAB — GLUCOSE, POCT (MANUAL RESULT ENTRY): POC Glucose: 260 mg/dl — AB (ref 70–99)

## 2019-07-03 NOTE — Progress Notes (Signed)
S:    PCP: Zelda   No chief complaint on file.  Patient arrives in good spirits.  Presents for diabetes evaluation, education, and management Patient was referred and last seen by Primary Care Provider on 06/04/19.   Patient reports diabetes was diagnosed ~4 yrs ago.   Family/Social History:  - FHx: CAD (mother), T2DM (maternal uncle) - Tobacco: current every day smoker (reports smoking 4 cigarettes/day) - Alcohol: admits to drinking one 40 oz beer every 2-3 days  Insurance coverage/medication affordability:  - [place] Medicaid  Patient denies adherence with medications.  **Cannot tolerate metformin - made him "feel funny" Current diabetes medications include: Lantus 30 units daily (forgets to take on some days), Januvia 50 mg daily  Current hypertension medications include: carvedilol 12.5 mg BID, BiDil 20-37.5 mg TID, Entresto 97-103 mg BID, spironolactone 12.5 mg daily, torsemide 80 mg qAM and 60 mg qPM Current hyperlipidemia medications include: atorvastatin 40 mg daily  Patient denies hypoglycemic events.  Patient reported dietary habits:  - Reports that he limits carbs but admits to drinking sweet tea, Koolaid, and regular soda  Patient-reported exercise habits:  - Reports walking occasionally but is unable to recall specific amounts or amount of time spent walking   Patient denies polyuria.  Patient reports neuropathy. Patient reports visual changes. Patient reports self foot exams.     O:  POCT: 260  Home fasting CBG: 200s  2 hour post-prandial/random CBG: 200-400s.  Lab Results  Component Value Date   HGBA1C 11.2 (H) 06/11/2019   There were no vitals filed for this visit.  Lipid Panel     Component Value Date/Time   CHOL 257 (H) 06/11/2019 1156   TRIG 270 (H) 06/11/2019 1156   HDL 39 (L) 06/11/2019 1156   CHOLHDL 6.6 (H) 06/11/2019 1156   CHOLHDL 5.2 12/10/2014 1619   VLDL 36 12/10/2014 1619   LDLCALC 164 (H) 06/11/2019 1156   Clinical ASCVD: No   The 10-year ASCVD risk score Mikey Bussing DC Jr., et al., 2013) is: 26.6%   Values used to calculate the score:     Age: 62 years     Sex: Male     Is Non-Hispanic African American: Yes     Diabetic: Yes     Tobacco smoker: Yes     Systolic Blood Pressure: 384 mmHg     Is BP treated: Yes     HDL Cholesterol: 39 mg/dL     Total Cholesterol: 257 mg/dL   Compelling indications: HF, CKD  A/P: Diabetes longstanding currently uncontrolled. Patient is able to verbalize appropriate hypoglycemia management plan. Patient is not adherent with medication. Control is suboptimal due to medication non-adherence and non-compliance with a diabetic lifestyle. I attempted to discuss significant morbidity/mortality associated with poorly controlled diabetes but pt displays a very nonchalant attitude.   He cannot tolerate metformin. His renal function from 06/11/19 is wnl but Scr has trended up slightly from 10 months ago. With his CKD and CHF, I recommend an SGLT-2 inhibitor. However, pt is in need of more robust A1c lowering and he continues to experience hyperglycemia at home. I recommend to increase insulin at this point to try and get him out of the 200 - 400 range at home. I emphasized to the patient that he will not benefit from additional medication fully until he adheres to a proper diabetic lifestyle.  -Increased dose of Lantus to 36 units daily.  -Continue Januvia. -Extensively discussed pathophysiology of DM, recommended lifestyle interventions, dietary effects  on glycemic control -Counseled on s/sx of and management of hypoglycemia -Next A1C anticipated 08/2019.  -Urine microalbumin due; will obtain at follow-up  ASCVD risk - primary prevention in patient with DM. Last LDL is not controlled. ASCVD risk score is >20%  - high intensity statin indicated. -Continued atorvastatin 40 mg.  -Low-fat, low-cholesterol diet  HM: UTD on PNA and tetanus vaccines.  - Influenza due; will address at next  appointment.   Written patient instructions provided.  Total time in face to face counseling 30 minutes.   Follow up Pharmacist Clinic Visit in 2 weeks.     Butch Penny, PharmD, CPP Clinical Pharmacist [facility] [phone]

## 2019-07-03 NOTE — Patient Instructions (Signed)
Thank you for coming to see me today. Please do the following:  1. Increase Lantus to 36 units daily.  2. Continue Januvia for now. 3. Continue checking blood sugars at home. 4. Continue making the lifestyle changes we've discussed together during our visit. Diet and exercise play a significant role in improving your blood sugars.  5. Follow-up with your PCP in 2 weeks.   Hypoglycemia or low blood sugar:   Low blood sugar can happen quickly and may become an emergency if not treated right away.   While this shouldn't happen often, it can be brought upon if you skip a meal or do not eat enough. Also, if your insulin or other diabetes medications are dosed too high, this can cause your blood sugar to go to low.   Warning signs of low blood sugar include: 1. Feeling shaky or dizzy 2. Feeling weak or tired  3. Excessive hunger 4. Feeling anxious or upset  5. Sweating even when you aren't exercising  What to do if I experience low blood sugar? 1. Check your blood sugar with your meter. If lower than 70, proceed to step 2.  2. Treat with 3-4 glucose tablets or 3 packets of regular sugar. If these aren't around, you can try hard candy. Yet another option would be to drink 4 ounces of fruit juice or 6 ounces of REGULAR soda.  3. Re-check your sugar in 15 minutes. If it is still below 70, do what you did in step 2 again. If has come back up, go ahead and eat a snack or small meal at this time.

## 2019-07-04 ENCOUNTER — Encounter: Payer: Self-pay | Admitting: Pharmacist

## 2019-07-16 ENCOUNTER — Other Ambulatory Visit: Payer: Self-pay

## 2019-07-16 ENCOUNTER — Ambulatory Visit: Payer: Medicaid Other | Attending: Family Medicine | Admitting: Pharmacist

## 2019-07-16 ENCOUNTER — Encounter: Payer: Self-pay | Admitting: Pharmacist

## 2019-07-16 ENCOUNTER — Ambulatory Visit: Payer: Medicaid Other | Admitting: Pharmacist

## 2019-07-16 DIAGNOSIS — Z833 Family history of diabetes mellitus: Secondary | ICD-10-CM | POA: Diagnosis not present

## 2019-07-16 DIAGNOSIS — Z794 Long term (current) use of insulin: Secondary | ICD-10-CM | POA: Insufficient documentation

## 2019-07-16 DIAGNOSIS — Z79899 Other long term (current) drug therapy: Secondary | ICD-10-CM | POA: Diagnosis not present

## 2019-07-16 DIAGNOSIS — I10 Essential (primary) hypertension: Secondary | ICD-10-CM | POA: Diagnosis not present

## 2019-07-16 DIAGNOSIS — Z8249 Family history of ischemic heart disease and other diseases of the circulatory system: Secondary | ICD-10-CM | POA: Diagnosis not present

## 2019-07-16 DIAGNOSIS — F17210 Nicotine dependence, cigarettes, uncomplicated: Secondary | ICD-10-CM | POA: Diagnosis not present

## 2019-07-16 DIAGNOSIS — E1140 Type 2 diabetes mellitus with diabetic neuropathy, unspecified: Secondary | ICD-10-CM | POA: Diagnosis not present

## 2019-07-16 DIAGNOSIS — N182 Chronic kidney disease, stage 2 (mild): Secondary | ICD-10-CM | POA: Diagnosis not present

## 2019-07-16 DIAGNOSIS — E785 Hyperlipidemia, unspecified: Secondary | ICD-10-CM | POA: Insufficient documentation

## 2019-07-16 DIAGNOSIS — E1122 Type 2 diabetes mellitus with diabetic chronic kidney disease: Secondary | ICD-10-CM | POA: Diagnosis not present

## 2019-07-16 DIAGNOSIS — Z23 Encounter for immunization: Secondary | ICD-10-CM | POA: Diagnosis not present

## 2019-07-16 LAB — GLUCOSE, POCT (MANUAL RESULT ENTRY): POC Glucose: 151 mg/dl — AB (ref 70–99)

## 2019-07-16 NOTE — Progress Notes (Signed)
S:    PCP: Zelda   No chief complaint on file.  Patient arrives in good spirits.  Presents for diabetes evaluation, education, and management Patient was referred and last seen by Primary Care Provider on 06/04/19.  I last saw the patient 07/03/19 and adjusted his insulin.  Patient reports diabetes was diagnosed ~4 yrs ago.   Family/Social History:  - FHx: CAD (mother), T2DM (maternal uncle) - Tobacco: current every day smoker (reports smoking 4 cigarettes/day) - Alcohol: admits to drinking one 40 oz beer every 2-3 days  Insurance coverage/medication affordability:  - [place] Medicaid  Patient reports adherence with medications.  **Cannot tolerate metformin - made him "feel funny" Current diabetes medications include: Lantus 36 units daily, Januvia 50 mg daily  Current hypertension medications include: carvedilol 12.5 mg BID, BiDil 20-37.5 mg TID, Entresto 97-103 mg BID, spironolactone 12.5 mg daily, torsemide 80 mg qAM and 60 mg qPM Current hyperlipidemia medications include: atorvastatin 40 mg daily  Patient denies hypoglycemic events.  Patient reported dietary habits:  - Reports that he limits carbs but admits to drinking sweet tea, Koolaid, and regular soda  Patient-reported exercise habits:  - Reports walking occasionally but is unable to recall specific amounts or amount of time spent walking   Patient denies polyuria.  Patient reports neuropathy. Patient reports visual changes. Patient reports self foot exams.    O:  POCT: 151  Home fasting CBG: 109 - 155  Lab Results  Component Value Date   HGBA1C 11.2 (H) 06/11/2019   There were no vitals filed for this visit.  Lipid Panel     Component Value Date/Time   CHOL 257 (H) 06/11/2019 1156   TRIG 270 (H) 06/11/2019 1156   HDL 39 (L) 06/11/2019 1156   CHOLHDL 6.6 (H) 06/11/2019 1156   CHOLHDL 5.2 12/10/2014 1619   VLDL 36 12/10/2014 1619   LDLCALC 164 (H) 06/11/2019 1156   Clinical ASCVD: No  The 10-year  ASCVD risk score Mikey Bussing DC Jr., et al., 2013) is: 26.6%   Values used to calculate the score:     Age: 43 years     Sex: Male     Is Non-Hispanic African American: Yes     Diabetic: Yes     Tobacco smoker: Yes     Systolic Blood Pressure: 161 mmHg     Is BP treated: Yes     HDL Cholesterol: 39 mg/dL     Total Cholesterol: 257 mg/dL   Compelling indications: HF, CKD  A/P: Diabetes longstanding currently uncontrolled. Patient is able to verbalize appropriate hypoglycemia management plan. Patient is adherent with medication. Home blood sugars have improved over this last week.   Advise to add SGLT-2 inhibitor in the future d/t patient's hx of CKD and CHF.   -Continue Lantus 36 units daily.  -Continue Januvia. -Extensively discussed pathophysiology of DM, recommended lifestyle interventions, dietary effects on glycemic control -Counseled on s/sx of and management of hypoglycemia -Next A1C anticipated 08/2019.  -Urine microalbumin due; pt unable to urinate.   ASCVD risk - primary prevention in patient with DM. Last LDL is not controlled. ASCVD risk score is >20%  - high intensity statin indicated. -Continued atorvastatin 40 mg.  -Low-fat, low-cholesterol diet  HM: UTD on PNA and tetanus vaccines.  - Influenza given  Written patient instructions provided.  Total time in face to face counseling 30 minutes.   Follow up PCP visit in October.     Benard Halsted, PharmD, CPP Clinical Pharmacist  [place] [phone]

## 2019-07-16 NOTE — Patient Instructions (Signed)
Thank you for coming to see me today. Please do the following:  1. Continue current medications.  2. Continue checking blood sugars at home.  3. Continue making the lifestyle changes we've discussed together during our visit. Diet and exercise play a significant role in improving your blood sugars.  4. Follow-up with your PCP in October.    Hypoglycemia or low blood sugar:   Low blood sugar can happen quickly and may become an emergency if not treated right away.   While this shouldn't happen often, it can be brought upon if you skip a meal or do not eat enough. Also, if your insulin or other diabetes medications are dosed too high, this can cause your blood sugar to go to low.   Warning signs of low blood sugar include: 1. Feeling shaky or dizzy 2. Feeling weak or tired  3. Excessive hunger 4. Feeling anxious or upset  5. Sweating even when you aren't exercising  What to do if I experience low blood sugar? 1. Check your blood sugar with your meter. If lower than 70, proceed to step 2.  2. Treat with 3-4 glucose tablets or 3 packets of regular sugar. If these aren't around, you can try hard candy. Yet another option would be to drink 4 ounces of fruit juice or 6 ounces of REGULAR soda.  3. Re-check your sugar in 15 minutes. If it is still below 70, do what you did in step 2 again. If has come back up, go ahead and eat a snack or small meal at this time.

## 2019-07-25 ENCOUNTER — Ambulatory Visit (INDEPENDENT_AMBULATORY_CARE_PROVIDER_SITE_OTHER): Payer: Medicaid Other | Admitting: *Deleted

## 2019-07-25 DIAGNOSIS — I472 Ventricular tachycardia, unspecified: Secondary | ICD-10-CM

## 2019-07-25 DIAGNOSIS — I5042 Chronic combined systolic (congestive) and diastolic (congestive) heart failure: Secondary | ICD-10-CM

## 2019-07-26 LAB — CUP PACEART REMOTE DEVICE CHECK
Battery Remaining Longevity: 68 mo
Battery Voltage: 2.98 V
Brady Statistic RV Percent Paced: 0.1 %
Date Time Interrogation Session: 20200910053828
HighPow Impedance: 49 Ohm
HighPow Impedance: 65 Ohm
Implantable Lead Implant Date: 20141009
Implantable Lead Location: 753860
Implantable Lead Model: 7121
Implantable Pulse Generator Implant Date: 20141009
Lead Channel Impedance Value: 323 Ohm
Lead Channel Impedance Value: 437 Ohm
Lead Channel Pacing Threshold Amplitude: 0.75 V
Lead Channel Pacing Threshold Pulse Width: 0.4 ms
Lead Channel Sensing Intrinsic Amplitude: 10.25 mV
Lead Channel Sensing Intrinsic Amplitude: 10.25 mV
Lead Channel Setting Pacing Amplitude: 2.5 V
Lead Channel Setting Pacing Pulse Width: 0.4 ms
Lead Channel Setting Sensing Sensitivity: 0.45 mV

## 2019-07-31 ENCOUNTER — Other Ambulatory Visit (HOSPITAL_COMMUNITY): Payer: Self-pay | Admitting: Adult Health

## 2019-07-31 MED FILL — ATORVASTATIN CALCIUM 40 MG: 40 | 30 days supply | Qty: 30 | Fill #1

## 2019-07-31 MED FILL — ENTRESTO 97 MG-103 MG TAB: 97-103 | 30 days supply | Qty: 60 | Fill #1

## 2019-07-31 MED FILL — BIDIL TABLET: 20-37.5 | 30 days supply | Qty: 90 | Fill #1

## 2019-07-31 MED FILL — JANUVIA 50 MG TABLET: 50 | 30 days supply | Qty: 30 | Fill #1

## 2019-08-01 ENCOUNTER — Other Ambulatory Visit (HOSPITAL_COMMUNITY): Payer: Self-pay | Admitting: Adult Health

## 2019-08-01 MED FILL — TORSEMIDE 20 MG TABLET: 20 | 30 days supply | Qty: 210 | Fill #0

## 2019-08-09 NOTE — Progress Notes (Signed)
Remote ICD transmission.   

## 2019-08-14 ENCOUNTER — Ambulatory Visit: Payer: Medicaid Other | Admitting: Nurse Practitioner

## 2019-08-28 ENCOUNTER — Other Ambulatory Visit (HOSPITAL_COMMUNITY): Payer: Self-pay | Admitting: Adult Health

## 2019-08-28 MED FILL — JANUVIA 50 MG TABLET: 50 | 30 days supply | Qty: 30 | Fill #2

## 2019-08-28 MED FILL — LANTUS SOLOSTAR 100 UNITS/M: 100 | 30 days supply | Qty: 15 | Fill #1

## 2019-08-28 MED FILL — TORSEMIDE 20 MG TABLET: 20 | 30 days supply | Qty: 210 | Fill #0

## 2019-08-31 MED FILL — CARVEDILOL 12.5 MG TABLET: 12.5 | 30 days supply | Qty: 60 | Fill #0

## 2019-08-31 MED FILL — ENTRESTO 97 MG-103 MG TAB: 97-103 | 30 days supply | Qty: 60 | Fill #2

## 2019-08-31 MED FILL — ATORVASTATIN CALCIUM 40 MG: 40 | 30 days supply | Qty: 30 | Fill #2

## 2019-09-12 ENCOUNTER — Encounter (HOSPITAL_COMMUNITY): Payer: Medicaid Other | Admitting: Internal Medicine

## 2019-09-19 MED FILL — ENTRESTO 97 MG-103 MG TAB: 97-103 | 30 days supply | Qty: 60 | Fill #2

## 2019-09-19 MED FILL — BIDIL 20-37.5 MG TABS: 20-37.5 | 30 days supply | Qty: 90 | Fill #2

## 2019-09-19 MED FILL — CARVEDILOL 12.5 MG TABLET: 12.5 | 30 days supply | Qty: 60 | Fill #0

## 2019-09-19 MED FILL — ATORVASTATIN CALCIUM 40 MG: 40 | 30 days supply | Qty: 30 | Fill #2

## 2019-09-24 ENCOUNTER — Ambulatory Visit: Payer: Medicaid Other | Attending: Nurse Practitioner | Admitting: Nurse Practitioner

## 2019-09-24 ENCOUNTER — Other Ambulatory Visit: Payer: Self-pay

## 2019-09-24 ENCOUNTER — Encounter: Payer: Self-pay | Admitting: Nurse Practitioner

## 2019-09-24 VITALS — BP 121/73 | HR 84 | Ht 76.0 in | Wt 348.6 lb

## 2019-09-24 DIAGNOSIS — E782 Mixed hyperlipidemia: Secondary | ICD-10-CM | POA: Diagnosis not present

## 2019-09-24 DIAGNOSIS — Z79899 Other long term (current) drug therapy: Secondary | ICD-10-CM | POA: Diagnosis not present

## 2019-09-24 DIAGNOSIS — Z7982 Long term (current) use of aspirin: Secondary | ICD-10-CM | POA: Diagnosis not present

## 2019-09-24 DIAGNOSIS — I5022 Chronic systolic (congestive) heart failure: Secondary | ICD-10-CM | POA: Diagnosis not present

## 2019-09-24 DIAGNOSIS — I10 Essential (primary) hypertension: Secondary | ICD-10-CM | POA: Diagnosis not present

## 2019-09-24 DIAGNOSIS — Z794 Long term (current) use of insulin: Secondary | ICD-10-CM | POA: Insufficient documentation

## 2019-09-24 DIAGNOSIS — I428 Other cardiomyopathies: Secondary | ICD-10-CM | POA: Insufficient documentation

## 2019-09-24 DIAGNOSIS — Z8249 Family history of ischemic heart disease and other diseases of the circulatory system: Secondary | ICD-10-CM | POA: Diagnosis not present

## 2019-09-24 DIAGNOSIS — I130 Hypertensive heart and chronic kidney disease with heart failure and stage 1 through stage 4 chronic kidney disease, or unspecified chronic kidney disease: Secondary | ICD-10-CM | POA: Insufficient documentation

## 2019-09-24 DIAGNOSIS — Z95810 Presence of automatic (implantable) cardiac defibrillator: Secondary | ICD-10-CM | POA: Diagnosis not present

## 2019-09-24 DIAGNOSIS — N182 Chronic kidney disease, stage 2 (mild): Secondary | ICD-10-CM | POA: Diagnosis not present

## 2019-09-24 DIAGNOSIS — E1122 Type 2 diabetes mellitus with diabetic chronic kidney disease: Secondary | ICD-10-CM

## 2019-09-24 LAB — GLUCOSE, POCT (MANUAL RESULT ENTRY): POC Glucose: 195 mg/dl — AB (ref 70–99)

## 2019-09-24 MED ORDER — BD PEN NEEDLE NANO U/F 32G X 4 MM MISC: 1.0000 "application " | Freq: Every day | 3 refills | Status: DC

## 2019-09-24 MED FILL — TRUEPLUS PEN NDL 32GX5/32: 32G X 4 MM | 25 days supply | Qty: 100 | Fill #0

## 2019-09-24 NOTE — Progress Notes (Signed)
Assessment & Plan:  Douglas [name] was seen today for diabetes and hypertension.  Diagnoses and all orders for this visit:  Essential hypertension -     CMP14+EGFR Continue all antihypertensives as prescribed.  Remember to bring in your blood pressure log with you for your follow up appointment.  DASH/Mediterranean Diets are healthier choices for HTN.    Diabetes mellitus with stage 2 chronic kidney disease (Douglas [name]) -     CMP14+EGFR -     Ambulatory referral to Ophthalmology -     urine micro -     A1c -     Glucose (CBG) -     Insulin Pen Needle (BD PEN NEEDLE NANO U/F) 32G X 4 MM MISC; Inject 1 application into the skin at bedtime. The patient is insulin requiring, ICD 10 code E11.9. The patient injects 1 times per day. Continue blood sugar control as discussed in office today, low carbohydrate diet, and regular physical exercise as tolerated, 150 minutes per week (30 min each day, 5 days per week, or 50 min 3 days per week). Keep blood sugar logs with fasting goal of 90-130 mg/dl, post prandial (after you eat) less than 180.  For Hypoglycemia: BS <60 and Hyperglycemia BS >400; contact the clinic ASAP. Annual eye exams and foot exams are recommended.   Mixed hyperlipidemia -     Lipid Panel INSTRUCTIONS: Work on a low fat, heart healthy diet and participate in regular aerobic exercise program by working out at least 150 minutes per week; 5 days a week-30 minutes per day. Avoid red meat/beef/steak,  fried foods. junk foods, sodas, sugary drinks, unhealthy snacking, alcohol and smoking.  Drink at least 80 oz of water per day and monitor your carbohydrate intake daily.     Patient has been counseled on age-appropriate routine health concerns for screening and prevention. These are reviewed and up-to-date. Referrals have been placed accordingly. Immunizations are up-to-date or declined.    Subjective:   Chief Complaint  Patient presents with  . Diabetes  . Hypertension   [name] 43 y.o. male presents to office today for follow up.  has a past medical history of Depression, major, single episode (01/14/2017), Diabetes mellitus (Douglas [name]), ED (erectile dysfunction), History of syncope (01/29/2015), Hypertension, ICD (implantable cardioverter-defibrillator) discharge (11/30/2014), Nonischemic cardiomyopathy (Douglas [name]), NSVT (nonsustained ventricular tachycardia) (Douglas [name]), Obesity, and Systolic CHF, chronic (Douglas [name]).   Has appt with Cardiology on 10-01-2019  DM TYPE 2  He has not checked his blood sugars in over a month. Can not recall any specific readings. Glucose in office is elevated today. He is not consistently diet adherent. Does not exercise. Still eating bologna. Had a bagel and egg sandwich prior to coming into office today. Denies any symptoms of hypoglycemia.  Current medications include Januvia 50 mg daily and Lantus 30 units in the evening.  Unfortunately we were unable to obtain his A1c today here in the office due to equipment malfunction.  Will order A1c and additional labs are sent out today.  Based on next A1c will determine if any adjustments with medications need to be made. I did recommend to Douglas [name] that he monitor his blood glucose levels at least twice a day with one fasting reading and one postprandial reading.  He was also encouraged to spot check his blood glucose levels after any meals that he is uncertain about that could cause spikes in his glucose levels.  He states he knows about nutrition already and studied nutrition in school.  Lab Results  Component Value Date   HGBA1C 11.2 (H) 06/11/2019   Essential Hypertension Blood pressures well controlled today.  He has not been monitoring his blood pressure at home recently.  Current medications include carvedilol 12.5 mg twice daily, BiDil 20-37.5 mg 3 times daily, Entresto 97-103 mg twice daily, spironolactone 12.5 mg daily and torsemide 80 mg in the a.m. and 60 mg in the evening. He continues to smoke up to 6  cigarettes a day. Not ready to fully quit. Feels he is doing good only smoking 6 as he used to smoke 1 ppd. Denies chest pain, shortness of breath, palpitations, lightheadedness, dizziness, headaches or or visual disturbances. Still with BLE edema.  BP Readings from Last 3 Encounters:  09/24/19 121/73  09/20/18 (!) 141/97  08/29/18 110/76   Hyperlipidemia Patient presents for follow up to hyperlipidemia. LDL is not at goal due to dietary intake.   He is medication compliant taking atorvastatin 40 mg daily. He denies statin intolerance including myalgias.  Lab Results  Component Value Date   CHOL 257 (H) 06/11/2019   Lab Results  Component Value Date   HDL 39 (L) 06/11/2019   Lab Results  Component Value Date   LDLCALC 164 (H) 06/11/2019   Lab Results  Component Value Date   TRIG 270 (H) 06/11/2019   Lab Results  Component Value Date   CHOLHDL 6.6 (H) 06/11/2019     Review of Systems  Constitutional: Negative for fever, malaise/fatigue and weight loss.  HENT: Negative.  Negative for nosebleeds.   Eyes: Negative.  Negative for blurred vision, double vision and photophobia.  Respiratory: Negative.  Negative for cough and shortness of breath.   Cardiovascular: Positive for leg swelling. Negative for chest pain and palpitations.  Gastrointestinal: Negative.  Negative for heartburn, nausea and vomiting.  Genitourinary:       ED  Musculoskeletal: Negative.  Negative for myalgias.  Neurological: Negative.  Negative for dizziness, focal weakness, seizures and headaches.  Psychiatric/Behavioral: Negative.  Negative for suicidal ideas.    Past Medical History:  Diagnosis Date  . Depression, major, single episode 01/14/2017  . Diabetes mellitus (Douglas [name])   . ED (erectile dysfunction)   . History of syncope 01/29/2015  . Hypertension   . 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 Asymptomatic.   Marland Kitchen Nonischemic cardiomyopathy (Douglas [name])    a.  echo 4/06:  EF 30%, mild to mod MR, mild RAE, inf HK, lat HK , ant HK;    b.  cath 4/06: no CAD, EF 20-25%  . NSVT (nonsustained ventricular tachycardia) (Douglas [name])   . Obesity   . Systolic CHF, chronic ([place])    EF 11/17 25-30%, s/p ICD    Past Surgical History:  Procedure Laterality Date  . CARDIAC CATHETERIZATION  09/2011; 02/2013; 04/2013  . CARDIAC DEFIBRILLATOR PLACEMENT  08/23/2013  . IMPLANTABLE CARDIOVERTER DEFIBRILLATOR IMPLANT N/A 08/23/2013   Procedure: IMPLANTABLE CARDIOVERTER DEFIBRILLATOR IMPLANT;  Surgeon: Deboraha Sprang, MD;  Location[facility] C[facility] LAB;  Service: Cardiovascular;  Laterality: N/A;  . LEFT AND RIGHT HEART CATHETERIZATION WITH CORONARY ANGIOGRAM N/A 09/20/2011   Procedure: LEFT AND RIGHT HEART CATHETERIZATION WITH CORONARY ANGIOGRAM;  Surgeon: Jolaine Artist, MD;  Location: [facility] CATH LAB;  Service: Cardiovascular;  Laterality: N/A;  . MULTIPLE EXTRACTIONS WITH ALVEOLOPLASTY N/A 01/26/2013   Procedure:  EXTRACION  TOOTH # 19 WITH ALVEOLOPLASTY;  Surgeon: Lenn Cal, DDS;  Location: [place];  Service: Oral Surgery;  Laterality: N/A;  . RIGHT HEART  CATHETERIZATION N/A 02/22/2013   Procedure: RIGHT HEART CATH;  Surgeon: Jolaine Artist, MD;  Location: [facility] CATH LAB;  Service: Cardiovascular;  Laterality: N/A;  . RIGHT HEART CATHETERIZATION N/A 05/03/2013   Procedure: RIGHT HEART CATH;  Surgeon: Jolaine Artist, MD;  Location: [facility] CATH LAB;  Service: Cardiovascular;  Laterality: N/A;    Family History  Problem Relation Age of Onset  . Coronary artery disease Mother 21       s/p PCI  . Lung cancer Father   . Diabetes type II Maternal Uncle   . Coronary artery disease Maternal Uncle   . Stroke Neg Hx   . Heart attack Neg Hx     Social History Reviewed with no changes to be made today.   Outpatient Medications Prior to Visit  Medication Sig Dispense Refill  . Accu-Chek FastClix Lancets MISC Use as instructed. Inject into the skin twice daily E11.8 100 each 3  . acetaminophen  (TYLENOL) 500 MG tablet Take 1 tablet (500 mg total) by mouth every 6 (six) hours as needed. 30 tablet 0  . amiodarone (PACERONE) 200 MG tablet Take 1 tablet (200 mg total) by mouth daily. DUE FOR FOLLOW UP WITH DR Haroldine Laws FOR FURTHER REFILLS 30 tablet 0  . aspirin EC 81 MG EC tablet Take 1 tablet (81 mg total) by mouth daily. 30 tablet 0  . atorvastatin (LIPITOR) 40 MG tablet TAKE 1 TABLET (40 MG TOTAL) BY MOUTH DAILY. 30 tablet 2  . Blood Glucose Calibration (ACCU-CHEK GUIDE CONTROL) LIQD 1 each by In Vitro route once as needed for up to 1 dose. 1 each 0  . Blood Pressure Monitor DEVI Please provide patient with insurance approved blood pressure monitor 1 Device 0  . carvedilol (COREG) 12.5 MG tablet TAKE 1 TABLET (12.5 MG TOTAL) BY MOUTH 2 (TWO) TIMES DAILY WITH A MEAL. 60 tablet 3  . glucose blood (ACCU-CHEK GUIDE) test strip Use as instructed. Check blood glucose by fingerstick twice per day. E11.8 100 each 12  . ibuprofen (ADVIL,MOTRIN) 600 MG tablet Take 1 tablet (600 mg total) by mouth every 6 (six) hours as needed. 30 tablet 0  . Insulin Glargine (LANTUS) 100 UNIT/ML Solostar Pen Inject 30 Units into the skin daily at 10 pm. (Patient taking differently: Inject 36 Units into the skin daily at 10 pm. ) 15 mL 11  . isosorbide-hydrALAZINE (BIDIL) 20-37.5 MG tablet Take 1 tablet by mouth 3 (three) times daily. 90 tablet 3  . sacubitril-valsartan (ENTRESTO) 97-103 MG Take 1 tablet by mouth 2 (two) times daily. 60 tablet 11  . sitaGLIPtin (JANUVIA) 50 MG tablet Take 1 tablet (50 mg total) by mouth daily. 90 tablet 2  . spironolactone (ALDACTONE) 25 MG tablet Take 0.5 tablets (12.5 mg total) by mouth daily. 45 tablet 3  . torsemide (DEMADEX) 20 MG tablet TAKE 4 TABLETS BY MOUTH IN THE MORNING AND 3 TABLETS IN THE EVENING 210 tablet 0  . Insulin Pen Needle (BD PEN NEEDLE NANO U/F) 32G X 4 MM MISC Inject 1 application into the skin at bedtime. The patient is insulin requiring, ICD 10 code E11.9. The  patient injects 1 times per day. 100 each 3   No facility-administered medications prior to visit.     Allergies  Allergen Reactions  . Bydureon [Exenatide] Rash  . Penicillins Other (See Comments)    Unknown childhood reaction        Objective:    BP 121/73 (BP Location: Right Arm, Patient Position:  Sitting, Cuff Size: Large)   Pulse 84   Ht '6\' 4"'$  (1.93 m)   Wt (!) 348 lb 9.6 oz (158.1 kg)   SpO2 95%   BMI 42.43 kg/m  Wt Readings from Last 3 Encounters:  09/24/19 (!) 348 lb 9.6 oz (158.1 kg)  09/20/18 (!) 330 lb (149.7 kg)  08/29/18 (!) 327 lb 3.2 oz (148.4 kg)    Physical Exam Vitals signs and nursing note reviewed.  Constitutional:      Appearance: He is well-developed.  HENT:     Head: Normocephalic and atraumatic.  Neck:     Musculoskeletal: Normal range of motion.  Cardiovascular:     Rate and Rhythm: Normal rate and regular rhythm.     Heart sounds: Normal heart sounds. No murmur. No friction rub. No gallop.   Pulmonary:     Effort: Pulmonary effort is normal. No tachypnea or respiratory distress.     Breath sounds: Normal breath sounds. No decreased breath sounds, wheezing, rhonchi or rales.  Chest:     Chest wall: No tenderness.  Abdominal:     General: Bowel sounds are normal.     Palpations: Abdomen is soft.  Musculoskeletal: Normal range of motion.     Right lower leg: Edema present.     Left lower leg: Edema (non pitting edema) present.  Feet:     Right foot:     Protective Sensation: 10 sites tested. 10 sites sensed.     Skin integrity: Callus and dry skin present.     Toenail Condition: Right toenails are abnormally thick. Fungal disease present.    Left foot:     Protective Sensation: 10 sites tested. 10 sites sensed.     Skin integrity: Callus and dry skin present.     Toenail Condition: Left toenails are abnormally thick. Fungal disease present. Skin:    General: Skin is warm and dry.  Neurological:     Mental Status: He is alert and  oriented to person, place, and time.     Coordination: Coordination normal.  Psychiatric:        Behavior: Behavior normal. Behavior is cooperative.        Thought Content: Thought content normal.        Judgment: Judgment normal.        Patient has been counseled extensively about nutrition and exercise as well as the importance of adherence with medications and regular follow-up. The patient was given clear instructions to go to ER or return to medical center if symptoms don't improve, worsen or new problems develop. The patient verbalized understanding.   Follow-up: Return for will call to schedule based on A1c results.   Gildardo Pounds, FNP-BC [facility] [place], [place]   09/24/2019, 2:32 PM

## 2019-09-25 LAB — LIPID PANEL
Chol/HDL Ratio: 4.5 ratio (ref 0.0–5.0)
Cholesterol, Total: 192 mg/dL (ref 100–199)
HDL: 43 mg/dL (ref >39)
LDL Chol Calc (NIH): 117 mg/dL — ABNORMAL HIGH (ref 0–99)
Triglycerides: 183 mg/dL — ABNORMAL HIGH (ref 0–149)
VLDL Cholesterol Cal: 32 mg/dL (ref 5–40)

## 2019-09-25 LAB — HEMOGLOBIN A1C
Est. average glucose Bld gHb Est-mCnc: 171 mg/dL
Hgb A1c MFr Bld: 7.6 % — ABNORMAL HIGH (ref 4.8–5.6)

## 2019-09-25 LAB — CMP14+EGFR
ALT: 23 IU/L (ref 0–44)
AST: 21 IU/L (ref 0–40)
Albumin/Globulin Ratio: 1.2 (ref 1.2–2.2)
Albumin: 3.7 g/dL — ABNORMAL LOW (ref 4.0–5.0)
Alkaline Phosphatase: 99 IU/L (ref 39–117)
BUN/Creatinine Ratio: 11 (ref 9–20)
BUN: 15 mg/dL (ref 6–24)
Bilirubin Total: 0.5 mg/dL (ref 0.0–1.2)
CO2: 27 mmol/L (ref 20–29)
Calcium: 8.8 mg/dL (ref 8.7–10.2)
Chloride: 102 mmol/L (ref 96–106)
Creatinine, Ser: 1.37 mg/dL — ABNORMAL HIGH (ref 0.76–1.27)
GFR calc Af Amer: 72 mL/min/{1.73_m2} (ref >59)
GFR calc non Af Amer: 62 mL/min/{1.73_m2} (ref >59)
Globulin, Total: 3.1 g/dL (ref 1.5–4.5)
Glucose: 178 mg/dL — ABNORMAL HIGH (ref 65–99)
Potassium: 3.8 mmol/L (ref 3.5–5.2)
Sodium: 143 mmol/L (ref 134–144)
Total Protein: 6.8 g/dL (ref 6.0–8.5)

## 2019-09-25 LAB — MICROALBUMIN / CREATININE URINE RATIO
Creatinine, Urine: 40.9 mg/dL
Microalb/Creat Ratio: 362 mg/g{creat} — ABNORMAL HIGH (ref 0–29)
Microalbumin, Urine: 147.9 ug/mL

## 2019-09-28 ENCOUNTER — Telehealth (INDEPENDENT_AMBULATORY_CARE_PROVIDER_SITE_OTHER): Payer: Self-pay

## 2019-09-28 NOTE — Telephone Encounter (Signed)
Patient date of birth verified. He is aware of all lab results and instructions per PCP. Nat Christen, CMA

## 2019-09-28 NOTE — Telephone Encounter (Signed)
-----   Message from Gildardo Pounds, NP sent at 09/25/2019  8:28 AM EST ----- A1c has significantly improved! Down from 11.2 to 7.6. You are doing a great job. Will continue the same medications. Please make a lab appointment for 4 weeks to check your kidney function. Cholesterol levels are elevated. Make sure you take your atorvastatin as prescribed. This helps lower your levels and reduce the risk of stroke or heart attack. INSTRUCTIONS: Work on a low fat, heart healthy diet and participate in regular aerobic exercise program by working out at least 150 minutes per week; 5 days a week-30 minutes per day. Avoid red meat/beef/steak,  fried foods. junk foods, sodas, sugary drinks, unhealthy snacking, alcohol and smoking.  Drink at least 80 oz of water per day and monitor your carbohydrate intake daily.

## 2019-10-01 ENCOUNTER — Telehealth (HOSPITAL_COMMUNITY): Payer: Self-pay | Admitting: Pharmacy Technician

## 2019-10-01 ENCOUNTER — Other Ambulatory Visit: Payer: Self-pay

## 2019-10-01 ENCOUNTER — Ambulatory Visit (HOSPITAL_COMMUNITY)
Admission: RE | Admit: 2019-10-01 | Discharge: 2019-10-01 | Disposition: A | Discharge status: Home / Self Care | Payer: Medicaid Other | Source: Ambulatory Visit | Attending: Internal Medicine | Admitting: Internal Medicine

## 2019-10-01 ENCOUNTER — Encounter (HOSPITAL_COMMUNITY): Payer: Self-pay | Admitting: Internal Medicine

## 2019-10-01 VITALS — BP 106/70 | HR 82 | Wt 347.6 lb

## 2019-10-01 DIAGNOSIS — F1490 Cocaine use, unspecified, uncomplicated: Secondary | ICD-10-CM | POA: Diagnosis not present

## 2019-10-01 DIAGNOSIS — Z8249 Family history of ischemic heart disease and other diseases of the circulatory system: Secondary | ICD-10-CM | POA: Diagnosis not present

## 2019-10-01 DIAGNOSIS — R9431 Abnormal electrocardiogram [ECG] [EKG]: Secondary | ICD-10-CM | POA: Diagnosis not present

## 2019-10-01 DIAGNOSIS — F1410 Cocaine abuse, uncomplicated: Secondary | ICD-10-CM | POA: Diagnosis not present

## 2019-10-01 DIAGNOSIS — I5022 Chronic systolic (congestive) heart failure: Secondary | ICD-10-CM | POA: Diagnosis present

## 2019-10-01 DIAGNOSIS — Z95810 Presence of automatic (implantable) cardiac defibrillator: Secondary | ICD-10-CM | POA: Insufficient documentation

## 2019-10-01 DIAGNOSIS — Z7982 Long term (current) use of aspirin: Secondary | ICD-10-CM | POA: Insufficient documentation

## 2019-10-01 DIAGNOSIS — I472 Ventricular tachycardia, unspecified: Secondary | ICD-10-CM

## 2019-10-01 DIAGNOSIS — I428 Other cardiomyopathies: Secondary | ICD-10-CM | POA: Insufficient documentation

## 2019-10-01 DIAGNOSIS — E119 Type 2 diabetes mellitus without complications: Secondary | ICD-10-CM | POA: Insufficient documentation

## 2019-10-01 DIAGNOSIS — I10 Essential (primary) hypertension: Secondary | ICD-10-CM | POA: Diagnosis not present

## 2019-10-01 DIAGNOSIS — F17210 Nicotine dependence, cigarettes, uncomplicated: Secondary | ICD-10-CM | POA: Insufficient documentation

## 2019-10-01 DIAGNOSIS — Z6841 Body Mass Index (BMI) 40.0 and over, adult: Secondary | ICD-10-CM | POA: Insufficient documentation

## 2019-10-01 DIAGNOSIS — E669 Obesity, unspecified: Secondary | ICD-10-CM | POA: Insufficient documentation

## 2019-10-01 DIAGNOSIS — Z9119 Patient's noncompliance with other medical treatment and regimen: Secondary | ICD-10-CM | POA: Diagnosis not present

## 2019-10-01 DIAGNOSIS — Z79899 Other long term (current) drug therapy: Secondary | ICD-10-CM | POA: Diagnosis not present

## 2019-10-01 DIAGNOSIS — I110 Hypertensive heart disease with heart failure: Secondary | ICD-10-CM | POA: Insufficient documentation

## 2019-10-01 DIAGNOSIS — E785 Hyperlipidemia, unspecified: Secondary | ICD-10-CM | POA: Diagnosis not present

## 2019-10-01 DIAGNOSIS — F329 Major depressive disorder, single episode, unspecified: Secondary | ICD-10-CM | POA: Diagnosis not present

## 2019-10-01 DIAGNOSIS — Z794 Long term (current) use of insulin: Secondary | ICD-10-CM | POA: Insufficient documentation

## 2019-10-01 LAB — COMPREHENSIVE METABOLIC PANEL
ALT: 24 U/L (ref 0–44)
AST: 25 U/L (ref 15–41)
Albumin: 3.4 g/dL — ABNORMAL LOW (ref 3.5–5.0)
Alkaline Phosphatase: 68 U/L (ref 38–126)
Anion gap: 13 (ref 5–15)
BUN: 19 mg/dL (ref 6–20)
CO2: 25 mmol/L (ref 22–32)
Calcium: 8.7 mg/dL — ABNORMAL LOW (ref 8.9–10.3)
Chloride: 102 mmol/L (ref 98–111)
Creatinine, Ser: 1.43 mg/dL — ABNORMAL HIGH (ref 0.61–1.24)
GFR calc Af Amer: >60 mL/min (ref >60)
GFR calc non Af Amer: 59 mL/min — ABNORMAL LOW (ref >60)
Glucose, Bld: 181 mg/dL — ABNORMAL HIGH (ref 70–99)
Potassium: 3.8 mmol/L (ref 3.5–5.1)
Sodium: 140 mmol/L (ref 135–145)
Total Bilirubin: 1.3 mg/dL — ABNORMAL HIGH (ref 0.3–1.2)
Total Protein: 6.9 g/dL (ref 6.5–8.1)

## 2019-10-01 LAB — HEMOGLOBIN A1C
Hgb A1c MFr Bld: 7.5 % — ABNORMAL HIGH (ref 4.8–5.6)
Mean Plasma Glucose: 168.55 mg/dL

## 2019-10-01 LAB — BRAIN NATRIURETIC PEPTIDE: B Natriuretic Peptide: 212.8 pg/mL — ABNORMAL HIGH (ref 0.0–100.0)

## 2019-10-01 MED ORDER — METOLAZONE 2.5 MG PO TABS: 2.5000 mg | ORAL_TABLET | ORAL | 0 refills | Status: DC

## 2019-10-01 MED ORDER — FARXIGA 10 MG PO TABS: 10.0000 mg | ORAL_TABLET | Freq: Every day | ORAL | 6 refills | Status: DC

## 2019-10-01 MED ORDER — TORSEMIDE 100 MG PO TABS: ORAL_TABLET | ORAL | 6 refills | Status: DC

## 2019-10-01 MED FILL — metOLazone 2.5 MG TABS: 2.5 | 5 days supply | Qty: 5 | Fill #0

## 2019-10-01 MED FILL — FARXIGA 10 MG TABLET: 10 | 30 days supply | Qty: 30 | Fill #0

## 2019-10-01 MED FILL — TORSEMIDE 100 MG TABLET: 100 | 30 days supply | Qty: 45 | Fill #0

## 2019-10-01 NOTE — Patient Instructions (Signed)
Stop Januvia  Start Farxiga 10 mg daily  Change Torsemide to 100 mg in AM (1 tab) and 50 mg (1/2 tab) in PM  Take Metolazone 2.5 mg TODAY ONLY  Take an extra 40 meq (2 tabs) of Potassium TODAY ONLY  Labs done today, we will notify you for abnormal results  Labs in 2 weeks  Your physician recommends that you schedule a follow-up appointment in: 6 weeks  If you have any questions or concerns before your next appointment please send [place] a message through [place] or call our office at [phone].  At the [facility], you and your health needs are our priority. As part of our continuing mission to provide you with exceptional heart care, we have created designated Provider Care Teams. These Care Teams include your primary Cardiologist (physician) and Advanced Practice Providers (APPs- Physician Assistants and Nurse Practitioners) who all work together to provide you with the care you need, when you need it.   You may see any of the following providers on your designated Care Team at your next follow up: Marland Kitchen Dr Glori Bickers . Dr Loralie Champagne . Darrick Grinder, NP . Lyda Jester, PA   Please be sure to bring in all your medications bottles to every appointment.

## 2019-10-01 NOTE — Telephone Encounter (Signed)
Patient Advocate Encounter   Received notification from Medicaid that prior authorization for Wilder Glade is required.   PA submitted on CoverMyMeds Key [id number] W Status is pending   Will continue to follow.  Charlann Boxer, CPhT

## 2019-10-01 NOTE — Addendum Note (Signed)
Encounter addended by: Scarlette Calico, RN on: 10/01/2019 11:48 AM  Actions taken: Diagnosis association updated, Order list changed, Medication long-term status modified, Charge Capture section accepted, Clinical Note Signed

## 2019-10-01 NOTE — Progress Notes (Signed)
ADVANCED HF CLINIC NOTE  Patient ID: Douglas [name], male   DOB: [birth year], 43 y.o.   MRN: [id number]  EP: Dr Caryl Comes Primary Cardiologist: Dr. Haroldine Laws  History of Present Illness: Douglas [name] is a 43 y.o. male with a h/o HTN, HL, obesity, smoker, cocaine abuse,  NICM and chronic systolic heart failure. He is S/P Medtronic ICD due to NICM. Management has been complicated by severe non-compliance.   Underwent cath in 2006 which showed normal cors with EF 20-25%.   EF had recovered in late 2013 but has again fallen to 15% per echo 01/20/13.  He was admitted to [facility] 02/21/13 from clinic for low output symptoms and volume overload.  He required IV lasix and metolazone.  He diuresed 19 pounds with discharge weight of 254 pounds.  He had runs of VT therefore he was started on amiodarone per EP and Lifevest was placed at discharge.  He underwent RHC on 02/22/13.  He has not required metolazone.   He was admitted in 1/16 with VT and ICD shock x 2.   Echo 6/20 25-30%   Today he returns for HF follow up. We have not seen him since 10/19. He has gained almost 20 pounds and now up to 350 pounds. Says he feels pretty good but has been sitting around with COVID. Mild SOB with activity. + LE edema. Sleeping in chair. Not wearing CPAP. Smoking about 3-4 cigs/day. Occasional beer. Occasional cocaine. Taking torsemide but says it is had taking 4 in am and 3 in pm  ICD interrogated personally in clinic: No VT. No AF. Fluid over threshold. Personally reviewed    RHC 02/22/13  RA = 12  RV = 49/6/15  PA = 54/29 (41)  PCW = 28 (v= 40)  Fick cardiac output/index = 4.3/1.7  PVR = 2.6 [place]  SVR = 1580 dynes  FA sat = 98%  PA sat = 53%, 54%  Non-invasive BP = 124/86 (97)  RHC 05/03/13 RA = 2  RV = 29/2/2  PA = 26/10 (17)  PCW = 3  Fick cardiac output/index = 6.1/2.3  PVR = 2.3  FA sat = 93%  PA sat = 65%, 67%  ECHO 06/25/13 EF 20-25% ECHO 05/01/14 EF 35-40%, moderately dilated LV, mild LVH,  diffuse hypokinesis Echo 6/16 EF 25-30%, mild LVH, moderately dilated LV.  ECHO 09/23/2016 EF 25-30%. ECHO 08/19/2017 EF 40-45% grade IDD  August 2012 CPX VO2 20 SH: lives with his Mom. Rare ETOH, smokes rarely. Remote cocaine.  FH: Mom has CAD.    Review of systems complete and found to be negative unless listed in HPI.   Past Medical History:  Diagnosis Date  . Depression, major, single episode 01/14/2017  . Diabetes mellitus ([place])   . ED (erectile dysfunction)   . History of syncope 01/29/2015  . Hypertension   . 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 Asymptomatic.   Marland Kitchen Nonischemic cardiomyopathy ([place])    a.  echo 4/06: EF 30%, mild to mod MR, mild RAE, inf HK, lat HK , ant HK;    b.  cath 4/06: no CAD, EF 20-25%  . NSVT (nonsustained ventricular tachycardia) ([place])   . Obesity   . Systolic CHF, chronic ([place])    EF 11/17 25-30%, s/p ICD    Current Outpatient Medications  Medication Sig Dispense Refill  . Accu-Chek FastClix Lancets MISC Use as instructed. Inject into the skin twice daily E11.8 100 each 3  .  acetaminophen (TYLENOL) 500 MG tablet Take 1 tablet (500 mg total) by mouth every 6 (six) hours as needed. 30 tablet 0  . aspirin EC 81 MG EC tablet Take 1 tablet (81 mg total) by mouth daily. 30 tablet 0  . atorvastatin (LIPITOR) 40 MG tablet TAKE 1 TABLET (40 MG TOTAL) BY MOUTH DAILY. 30 tablet 2  . Blood Glucose Calibration (ACCU-CHEK GUIDE CONTROL) LIQD 1 each by In Vitro route once as needed for up to 1 dose. 1 each 0  . Blood Pressure Monitor DEVI Please provide patient with insurance approved blood pressure monitor 1 Device 0  . carvedilol (COREG) 12.5 MG tablet TAKE 1 TABLET (12.5 MG TOTAL) BY MOUTH 2 (TWO) TIMES DAILY WITH A MEAL. 60 tablet 3  . glucose blood (ACCU-CHEK GUIDE) test strip Use as instructed. Check blood glucose by fingerstick twice per day. E11.8 100 each 12  . ibuprofen (ADVIL,MOTRIN) 600 MG tablet Take 1 tablet  (600 mg total) by mouth every 6 (six) hours as needed. 30 tablet 0  . Insulin Glargine (LANTUS) 100 UNIT/ML Solostar Pen Inject 30 Units into the skin daily at 10 pm. (Patient taking differently: Inject 36 Units into the skin daily at 10 pm. ) 15 mL 11  . Insulin Pen Needle (BD PEN NEEDLE NANO U/F) 32G X 4 MM MISC Inject 1 application into the skin at bedtime. The patient is insulin requiring, ICD 10 code E11.9. The patient injects 1 times per day. 100 each 3  . isosorbide-hydrALAZINE (BIDIL) 20-37.5 MG tablet Take 1 tablet by mouth 3 (three) times daily. 90 tablet 3  . sacubitril-valsartan (ENTRESTO) 97-103 MG Take 1 tablet by mouth 2 (two) times daily. 60 tablet 11  . sitaGLIPtin (JANUVIA) 50 MG tablet Take 1 tablet (50 mg total) by mouth daily. 90 tablet 2  . spironolactone (ALDACTONE) 25 MG tablet Take 0.5 tablets (12.5 mg total) by mouth daily. 45 tablet 3  . torsemide (DEMADEX) 20 MG tablet TAKE 4 TABLETS BY MOUTH IN THE MORNING AND 3 TABLETS IN THE EVENING 210 tablet 0  . potassium chloride SA (KLOR-CON) 20 MEQ tablet      No current facility-administered medications for this encounter.     Allergies  Allergen Reactions  . Bydureon [Exenatide] Rash  . Penicillins Other (See Comments)    Unknown childhood reaction     Vitals:   10/01/19 1058  BP: 106/70  Pulse: 82  SpO2: 99%  Weight: (!) 157.7 kg (347 lb 9.6 oz)   Wt Readings from Last 3 Encounters:  10/01/19 (!) 157.7 kg (347 lb 9.6 oz)  09/24/19 (!) 158.1 kg (348 lb 9.6 oz)  09/20/18 (!) 149.7 kg (330 lb)   PHYSICAL EXAM: General:  Obese male. No resp difficulty HEENT: normal Neck: supple. no JVD. Carotids 2+ bilat; no bruits. No lymphadenopathy or thryomegaly appreciated. Cor: PMI nondisplaced. Regular rate & rhythm. No rubs, gallops or murmurs. Lungs: clear Abdomen: obese soft, nontender, nondistended. No hepatosplenomegaly. No bruits or masses. Good bowel sounds. Extremities: no cyanosis, clubbing, rash, tr-1+ edema  Neuro: alert & orientedx3, cranial nerves grossly intact. moves all 4 extremities w/o difficulty. Affect pleasant  NSR 78 non-specific TW abnormality. Personally reviewed   1) Chronic systolic HF: Nonischemic cardiomyopathy. Medtronic ICD  - Echo 09/2016 with EF 25-30%, RV systolic function mildly reduced - ECHO 08/19/2017 EF 40-45%. - Echo 6/20 EF 25-30% - Stable NYHA II-III  - Volume elevated. Change torsemide from 80/60 to 100/50 to make it easier with pills -  Give metolazone 2.5 today. + K 40 - Continue carvediol 12.5 mg twice a day.  - Continue Bidil 1 tab TID - Continue entresto 97/103 twice a day.  - Switch Januvia to Farxiga 10  - Continue spiro 12.5 mg daily. BMET today and in 7-10 days - F/u NP/PA  2) HTN:  - Well controlled  3) VT:  - Quiescent. Off amio x 1 year - ICD interrogated personally today. No VT or AF. Activity 3 hours. Fluid up 4) Cocaine abuse:   - Continues to use intermittently due to stress. Discussed cessation.  5) Tobacco Abuse - Smoking 3-5 cigarettes/day - Discussed cessation 6) DMII- on insulin. - Switch Januvia to Farxiga 10    [name], MD  10/01/2019

## 2019-10-03 NOTE — Telephone Encounter (Signed)
Advanced Heart Failure Patient Advocate Encounter  Prior Authorization for Wilder Glade has been approved.    PA#  [id number] Effective dates: 10/01/2019 through 09/30/2020  Patients co-pay is $3.00  Charlann Boxer, CPhT

## 2019-10-15 ENCOUNTER — Other Ambulatory Visit: Payer: Self-pay

## 2019-10-15 ENCOUNTER — Other Ambulatory Visit (HOSPITAL_COMMUNITY): Payer: Self-pay

## 2019-10-15 ENCOUNTER — Ambulatory Visit (HOSPITAL_COMMUNITY)
Admission: RE | Admit: 2019-10-15 | Discharge: 2019-10-15 | Disposition: A | Discharge status: Home / Self Care | Payer: Medicaid Other | Source: Ambulatory Visit | Attending: Cardiology | Admitting: Cardiology

## 2019-10-15 DIAGNOSIS — I5022 Chronic systolic (congestive) heart failure: Secondary | ICD-10-CM | POA: Insufficient documentation

## 2019-10-15 LAB — BASIC METABOLIC PANEL
Anion gap: 13 (ref 5–15)
BUN: 19 mg/dL (ref 6–20)
CO2: 24 mmol/L (ref 22–32)
Calcium: 8.8 mg/dL — ABNORMAL LOW (ref 8.9–10.3)
Chloride: 102 mmol/L (ref 98–111)
Creatinine, Ser: 1.62 mg/dL — ABNORMAL HIGH (ref 0.61–1.24)
GFR calc Af Amer: 59 mL/min — ABNORMAL LOW (ref >60)
GFR calc non Af Amer: 51 mL/min — ABNORMAL LOW (ref >60)
Glucose, Bld: 251 mg/dL — ABNORMAL HIGH (ref 70–99)
Potassium: 3.4 mmol/L — ABNORMAL LOW (ref 3.5–5.1)
Sodium: 139 mmol/L (ref 135–145)

## 2019-10-15 NOTE — Progress Notes (Signed)
Orders Placed This Encounter  Procedures  . Basic Metabolic Panel (BMET)    Standing Status:   Future    Standing Expiration Date:   10/14/2020

## 2019-10-18 ENCOUNTER — Telehealth (HOSPITAL_COMMUNITY): Payer: Self-pay | Admitting: *Deleted

## 2019-10-18 MED ORDER — POTASSIUM CHLORIDE CRYS ER 20 MEQ PO TBCR: 40.0000 meq | EXTENDED_RELEASE_TABLET | Freq: Two times a day (BID) | ORAL | 6 refills | Status: DC

## 2019-10-18 NOTE — Telephone Encounter (Signed)
-----   Message from Jolaine Artist, MD sent at 10/18/2019  3:32 PM EST ----- Increase k to 40 bid. Take 40 extra today.

## 2019-10-18 NOTE — Telephone Encounter (Signed)
Notes recorded by Scarlette Calico, RN on 10/18/2019 at 5:24 PM EST  Pt aware, agreeable and verbalizes understanding, new rx sent in

## 2019-10-19 MED FILL — POTASSIUM CL ER 20 MEQ TAB: 20 | 30 days supply | Qty: 120 | Fill #0

## 2019-10-22 MED FILL — FARXIGA 10 MG TABLET: 10 | 30 days supply | Qty: 30 | Fill #1

## 2019-10-25 ENCOUNTER — Other Ambulatory Visit (HOSPITAL_COMMUNITY): Payer: Self-pay | Admitting: Adult Health

## 2019-10-25 DIAGNOSIS — H40023 Open angle with borderline findings, high risk, bilateral: Secondary | ICD-10-CM | POA: Diagnosis not present

## 2019-10-25 DIAGNOSIS — Z794 Long term (current) use of insulin: Secondary | ICD-10-CM | POA: Diagnosis not present

## 2019-10-25 DIAGNOSIS — E119 Type 2 diabetes mellitus without complications: Secondary | ICD-10-CM | POA: Diagnosis not present

## 2019-10-25 MED FILL — CARVEDILOL 12.5 MG TABLET: 12.5 | 30 days supply | Qty: 60 | Fill #1

## 2019-10-25 MED FILL — ENTRESTO 97 MG-103 MG TAB: 97-103 | 30 days supply | Qty: 60 | Fill #3

## 2019-10-25 MED FILL — LANTUS SOLOSTAR 100 UNITS/M: 100 | 50 days supply | Qty: 15 | Fill #2

## 2019-10-26 MED FILL — TORSEMIDE 20 MG TABLET: 20 | 30 days supply | Qty: 210 | Fill #0

## 2019-10-30 ENCOUNTER — Other Ambulatory Visit (HOSPITAL_COMMUNITY): Payer: Self-pay | Admitting: Internal Medicine

## 2019-10-30 DIAGNOSIS — E1122 Type 2 diabetes mellitus with diabetic chronic kidney disease: Secondary | ICD-10-CM

## 2019-10-30 MED FILL — POTASSIUM CL ER 20 MEQ TAB: 20 | 30 days supply | Qty: 120 | Fill #0

## 2019-10-30 MED FILL — ATORVASTATIN CALCIUM 40 MG: 40 | 30 days supply | Qty: 30 | Fill #0

## 2019-11-15 ENCOUNTER — Ambulatory Visit: Payer: Medicaid Other | Attending: Internal Medicine

## 2019-11-15 DIAGNOSIS — U071 COVID-19: Secondary | ICD-10-CM | POA: Diagnosis not present

## 2019-11-15 DIAGNOSIS — R238 Other skin changes: Secondary | ICD-10-CM | POA: Diagnosis not present

## 2019-11-17 LAB — NOVEL CORONAVIRUS, NAA: SARS-CoV-2, NAA: DETECTED — AB

## 2019-11-18 ENCOUNTER — Telehealth: Payer: Self-pay | Admitting: Nurse Practitioner

## 2019-11-18 NOTE — Telephone Encounter (Signed)
Called to discuss with patient about Covid symptoms and the use of bamlanivimab, a monoclonal antibody infusion for those with mild to moderate Covid symptoms and at a high risk of hospitalization.  Pt is not qualified for this infusion at the [facility] infusion center due to no current symptoms and is overall "feeling well".  Discussed quarantine time and symptoms to seek immediate care for.  Advised him to ensure he schedules follow-up with his PCP.

## 2019-11-19 ENCOUNTER — Encounter (HOSPITAL_COMMUNITY): Payer: Medicaid Other

## 2019-11-29 MED FILL — POTASSIUM CL ER 20 MEQ TAB: 20 | 30 days supply | Qty: 120 | Fill #1

## 2019-11-29 MED FILL — CARVEDILOL 12.5 MG TABLET: 12.5 | 30 days supply | Qty: 60 | Fill #2

## 2019-11-29 MED FILL — ATORVASTATIN CALCIUM 40 MG: 40 | 30 days supply | Qty: 30 | Fill #0

## 2019-11-29 MED FILL — TORSEMIDE 100 MG TABLET: 100 | 30 days supply | Qty: 45 | Fill #1

## 2019-11-29 MED FILL — FARXIGA 10 MG TABLET: 10 | 30 days supply | Qty: 30 | Fill #2

## 2019-11-29 MED FILL — ENTRESTO 97 MG-103 MG TAB: 97-103 | 30 days supply | Qty: 60 | Fill #4

## 2019-11-29 MED FILL — BIDIL 20-37.5 MG TABS: 20-37.5 | 30 days supply | Qty: 90 | Fill #3

## 2019-12-06 ENCOUNTER — Other Ambulatory Visit: Payer: Self-pay

## 2019-12-06 ENCOUNTER — Encounter (HOSPITAL_COMMUNITY): Payer: Self-pay

## 2019-12-06 ENCOUNTER — Ambulatory Visit (HOSPITAL_COMMUNITY)
Admission: RE | Admit: 2019-12-06 | Discharge: 2019-12-06 | Disposition: A | Discharge status: Home / Self Care | Payer: Medicaid Other | Source: Ambulatory Visit | Attending: Cardiology | Admitting: Cardiology

## 2019-12-06 VITALS — BP 110/72 | HR 87 | Wt 335.8 lb

## 2019-12-06 DIAGNOSIS — E669 Obesity, unspecified: Secondary | ICD-10-CM | POA: Insufficient documentation

## 2019-12-06 DIAGNOSIS — Z720 Tobacco use: Secondary | ICD-10-CM

## 2019-12-06 DIAGNOSIS — Z79899 Other long term (current) drug therapy: Secondary | ICD-10-CM | POA: Diagnosis not present

## 2019-12-06 DIAGNOSIS — I428 Other cardiomyopathies: Secondary | ICD-10-CM | POA: Diagnosis not present

## 2019-12-06 DIAGNOSIS — Z794 Long term (current) use of insulin: Secondary | ICD-10-CM | POA: Insufficient documentation

## 2019-12-06 DIAGNOSIS — I5022 Chronic systolic (congestive) heart failure: Secondary | ICD-10-CM | POA: Insufficient documentation

## 2019-12-06 DIAGNOSIS — Z8249 Family history of ischemic heart disease and other diseases of the circulatory system: Secondary | ICD-10-CM | POA: Insufficient documentation

## 2019-12-06 DIAGNOSIS — F17210 Nicotine dependence, cigarettes, uncomplicated: Secondary | ICD-10-CM | POA: Insufficient documentation

## 2019-12-06 DIAGNOSIS — Z7982 Long term (current) use of aspirin: Secondary | ICD-10-CM | POA: Diagnosis not present

## 2019-12-06 DIAGNOSIS — E119 Type 2 diabetes mellitus without complications: Secondary | ICD-10-CM | POA: Insufficient documentation

## 2019-12-06 DIAGNOSIS — Z95810 Presence of automatic (implantable) cardiac defibrillator: Secondary | ICD-10-CM | POA: Diagnosis not present

## 2019-12-06 DIAGNOSIS — I5042 Chronic combined systolic (congestive) and diastolic (congestive) heart failure: Secondary | ICD-10-CM | POA: Diagnosis not present

## 2019-12-06 DIAGNOSIS — I110 Hypertensive heart disease with heart failure: Secondary | ICD-10-CM | POA: Diagnosis not present

## 2019-12-06 DIAGNOSIS — Z9119 Patient's noncompliance with other medical treatment and regimen: Secondary | ICD-10-CM | POA: Insufficient documentation

## 2019-12-06 DIAGNOSIS — Z8616 Personal history of COVID-19: Secondary | ICD-10-CM | POA: Diagnosis not present

## 2019-12-06 DIAGNOSIS — F1490 Cocaine use, unspecified, uncomplicated: Secondary | ICD-10-CM

## 2019-12-06 DIAGNOSIS — R0602 Shortness of breath: Secondary | ICD-10-CM | POA: Diagnosis present

## 2019-12-06 LAB — BASIC METABOLIC PANEL
Anion gap: 9 (ref 5–15)
BUN: 18 mg/dL (ref 6–20)
CO2: 27 mmol/L (ref 22–32)
Calcium: 8.9 mg/dL (ref 8.9–10.3)
Chloride: 100 mmol/L (ref 98–111)
Creatinine, Ser: 1.2 mg/dL (ref 0.61–1.24)
GFR calc Af Amer: >60 mL/min (ref >60)
GFR calc non Af Amer: >60 mL/min (ref >60)
Glucose, Bld: 192 mg/dL — ABNORMAL HIGH (ref 70–99)
Potassium: 4.2 mmol/L (ref 3.5–5.1)
Sodium: 136 mmol/L (ref 135–145)

## 2019-12-06 NOTE — Progress Notes (Signed)
ADVANCED HF CLINIC NOTE  Patient ID: Douglas [name], male   DOB: [birth year], 44 y.o.   MRN: [id number]  EP: Dr Graciela Husbands Primary Cardiologist: Dr. [name] PCP: Bertram Denver NP  History of Present Illness: Douglas [name] is a 44 y.o. male with a h/o HTN, HL, obesity, smoker, cocaine abuse,  NICM and chronic systolic heart failure. He is S/P Medtronic ICD due to NICM. Management has been complicated by severe non-compliance.   Underwent cath in 2006 which showed normal cors with EF 20-25%.   EF had recovered in late 2013 but has again fallen to 15% per echo 01/20/13.  He was admitted to [facility] 02/21/13 from clinic for low output symptoms and volume overload.  He required IV lasix and metolazone.  He diuresed 19 pounds with discharge weight of 254 pounds.  He had runs of VT therefore he was started on amiodarone per EP and Lifevest was placed at discharge.  He underwent RHC on 02/22/13.  He has not required metolazone.   He was admitted in 1/16 with VT and ICD shock x 2.   Echo 6/20 -->EF 25-30%   He was COVID + 11/14/20. He quarantined and recovered.   Today he returns for HF follow up.Overall feeling fine.SOB with moderate exertion.  Denies PND/Orthopnea. Appetite ok. No fever or chills. Weight at home 330-332  pounds. Taking all medications. Smoking 1-2 cigarettes per day. Not drinking alcohol. Last used cocaine last week.    ICD interrogated personally in clinic: unable to obtain interrogation.   RHC 02/22/13  RA = 12  RV = 49/6/15  PA = 54/29 (41)  PCW = 28 (v= 40)  Fick cardiac output/index = 4.3/1.7  PVR = 2.6 Woods  SVR = 1580 dynes  FA sat = 98%  PA sat = 53%, 54%  Non-invasive BP = 124/86 (97)  RHC 05/03/13 RA = 2  RV = 29/2/2  PA = 26/10 (17)  PCW = 3  Fick cardiac output/index = 6.1/2.3  PVR = 2.3  FA sat = 93%  PA sat = 65%, 67%  ECHO 06/25/13 EF 20-25% ECHO 05/01/14 EF 35-40%, moderately dilated LV, mild LVH, diffuse hypokinesis Echo 6/16 EF 25-30%, mild LVH,  moderately dilated LV.  ECHO 09/23/2016 EF 25-30%. ECHO 08/19/2017 EF 40-45% grade IDD  August 2012 CPX VO2 20 SH: lives with his sister. Rare ETOH, smokes rarely. Remote cocaine. FH: Mom has CAD.    Review of systems complete and found to be negative unless listed in HPI.   Past Medical History:  Diagnosis Date  . Depression, major, single episode 01/14/2017  . Diabetes mellitus (HCC)   . ED (erectile dysfunction)   . History of syncope 01/29/2015  . Hypertension   . 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 Asymptomatic.   Marland Kitchen Nonischemic cardiomyopathy (HCC)    a.  echo 4/06: EF 30%, mild to mod MR, mild RAE, inf HK, lat HK , ant HK;    b.  cath 4/06: no CAD, EF 20-25%  . NSVT (nonsustained ventricular tachycardia) (HCC)   . Obesity   . Systolic CHF, chronic (HCC)    EF 11/17 25-30%, s/p ICD    Current Outpatient Medications  Medication Sig Dispense Refill  . Accu-Chek FastClix Lancets MISC Use as instructed. Inject into the skin twice daily E11.8 100 each 3  . acetaminophen (TYLENOL) 500 MG tablet Take 1 tablet (500 mg total) by mouth every 6 (six) hours as needed. 30  tablet 0  . aspirin EC 81 MG EC tablet Take 1 tablet (81 mg total) by mouth daily. 30 tablet 0  . atorvastatin (LIPITOR) 40 MG tablet TAKE 1 TABLET (40 MG TOTAL) BY MOUTH DAILY. 30 tablet 2  . Blood Glucose Calibration (ACCU-CHEK GUIDE CONTROL) LIQD 1 each by In Vitro route once as needed for up to 1 dose. 1 each 0  . Blood Pressure Monitor DEVI Please provide patient with insurance approved blood pressure monitor 1 Device 0  . carvedilol (COREG) 12.5 MG tablet TAKE 1 TABLET (12.5 MG TOTAL) BY MOUTH 2 (TWO) TIMES DAILY WITH A MEAL. 60 tablet 3  . dapagliflozin propanediol (FARXIGA) 10 MG TABS tablet Take 10 mg by mouth daily before breakfast. 30 tablet 6  . glucose blood (ACCU-CHEK GUIDE) test strip Use as instructed. Check blood glucose by fingerstick twice per day. E11.8 100 each  12  . ibuprofen (ADVIL,MOTRIN) 600 MG tablet Take 1 tablet (600 mg total) by mouth every 6 (six) hours as needed. 30 tablet 0  . Insulin Glargine (LANTUS) 100 UNIT/ML Solostar Pen Inject 30 Units into the skin daily at 10 pm. (Patient taking differently: Inject 36 Units into the skin daily at 10 pm. ) 15 mL 11  . Insulin Pen Needle (BD PEN NEEDLE NANO U/F) 32G X 4 MM MISC Inject 1 application into the skin at bedtime. The patient is insulin requiring, ICD 10 code E11.9. The patient injects 1 times per day. 100 each 3  . isosorbide-hydrALAZINE (BIDIL) 20-37.5 MG tablet Take 1 tablet by mouth 3 (three) times daily. 90 tablet 3  . potassium chloride SA (KLOR-CON) 20 MEQ tablet Take 2 tablets (40 mEq total) by mouth 2 (two) times daily. 120 tablet 6  . sacubitril-valsartan (ENTRESTO) 97-103 MG Take 1 tablet by mouth 2 (two) times daily. 60 tablet 11  . torsemide (DEMADEX) 100 MG tablet Take 1 tablet (100 mg total) by mouth every morning AND 0.5 tablets (50 mg total) every evening. 45 tablet 6  . metolazone (ZAROXOLYN) 2.5 MG tablet Take 1 tablet (2.5 mg total) by mouth as directed. (Patient not taking: Reported on 12/06/2019) 5 tablet 0  . spironolactone (ALDACTONE) 25 MG tablet Take 0.5 tablets (12.5 mg total) by mouth daily. (Patient not taking: Reported on 12/06/2019) 45 tablet 3   No current facility-administered medications for this encounter.    Allergies  Allergen Reactions  . Bydureon [Exenatide] Rash  . Penicillins Other (See Comments)    Unknown childhood reaction     Vitals:   12/06/19 1504  BP: 110/72  Pulse: 87  SpO2: 98%  Weight: (!) 152.3 kg (335 lb 12.8 oz)   Wt Readings from Last 3 Encounters:  12/06/19 (!) 152.3 kg (335 lb 12.8 oz)  10/01/19 (!) 157.7 kg (347 lb 9.6 oz)  09/24/19 (!) 158.1 kg (348 lb 9.6 oz)   PHYSICAL EXAM: General:  No resp difficulty HEENT: normal Neck: supple. JVP 6-7 . Carotids 2+ bilat; no bruits. No lymphadenopathy or thryomegaly  appreciated. Cor: PMI nondisplaced. Regular rate & rhythm. No rubs, gallops or murmurs. Lungs: clear Abdomen: obese, soft, nontender, nondistended. No hepatosplenomegaly. No bruits or masses. Good bowel sounds. Extremities: no cyanosis, clubbing, rash, edema Neuro: alert & orientedx3, cranial nerves grossly intact. moves all 4 extremities w/o difficulty. Affect pleasant    1) Chronic systolic HF: Nonischemic cardiomyopathy. Medtronic ICD  - Echo 09/2016 with EF 25-30%, RV systolic function mildly reduced - ECHO 08/19/2017 EF 40-45%. - Echo 6/20 EF  25-30%  -NYHA III. Volume status stable. Continue torsemide 100/50 mg daily.   - Continue carvediol 12.5 mg twice a day.  - Continue Bidil 1 tab TID - Continue entresto 97/103 twice a day.   - Continue Farxiga 10 mg daily  - Continue spiro 12.5 mg daily. - Check BMET today   2) HTN:  - Stable.  3) VT:  - Quiescent. Off amio x 1 year -4) Cocaine abuse:   - Recently used. Discussed cessation.   5) Tobacco Abuse - Discussed cessation 6) DMII- on insulin. Continue Farxiga 10 mg daily.   FOllow up in 3-4 months.  Darrick Grinder, NP  12/06/2019

## 2019-12-06 NOTE — Patient Instructions (Addendum)
Lab work done today. We will notify you of any abnormal lab work. No news is good news!  Please follow up with the [facility] in 3-4 months.  At the [facility], you and your health needs are our priority. As part of our continuing mission to provide you with exceptional heart care, we have created designated Provider Care Teams. These Care Teams include your primary Cardiologist (physician) and Advanced Practice Providers (APPs- Physician Assistants and Nurse Practitioners) who all work together to provide you with the care you need, when you need it.   You may see any of the following providers on your designated Care Team at your next follow up: . Dr Daniel Bensimhon . Dr Dalton McLean . Amy Clegg, NP . Brittainy Simmons, PA . Lauren Kemp, PharmD   Please be sure to bring in all your medications bottles to every appointment.    

## 2019-12-12 MED FILL — LANTUS SOLOSTAR 100 UNITS/M: 100 | 50 days supply | Qty: 15 | Fill #3

## 2020-01-01 MED FILL — CARVEDILOL 12.5 MG TABLET: 12.5 | 30 days supply | Qty: 60 | Fill #3

## 2020-01-01 MED FILL — POTASSIUM CL ER 20 MEQ TABL: 20 | 30 days supply | Qty: 120 | Fill #2

## 2020-01-01 MED FILL — BIDIL 20-37.5 MG TABS: 20-37.5 | 30 days supply | Qty: 90 | Fill #0

## 2020-01-01 MED FILL — ENTRESTO 97 MG-103 MG TAB: 97-103 | 30 days supply | Qty: 60 | Fill #5

## 2020-01-01 MED FILL — FARXIGA 10 MG TABLET: 10 | 30 days supply | Qty: 30 | Fill #3

## 2020-01-01 MED FILL — ATORVASTATIN CALCIUM 40 MG: 40 | 30 days supply | Qty: 30 | Fill #1

## 2020-01-01 MED FILL — TORSEMIDE 100 MG TABLET: 100 | 30 days supply | Qty: 45 | Fill #2

## 2020-01-23 ENCOUNTER — Ambulatory Visit (INDEPENDENT_AMBULATORY_CARE_PROVIDER_SITE_OTHER): Payer: Medicaid Other | Admitting: *Deleted

## 2020-01-23 DIAGNOSIS — I472 Ventricular tachycardia, unspecified: Secondary | ICD-10-CM

## 2020-01-24 LAB — CUP PACEART REMOTE DEVICE CHECK
Battery Remaining Longevity: 59 mo
Battery Voltage: 2.97 V
Brady Statistic RV Percent Paced: 0.01 %
Date Time Interrogation Session: 20210311022828
HighPow Impedance: 57 Ohm
HighPow Impedance: 80 Ohm
Implantable Lead Implant Date: 20141009
Implantable Lead Location: 753860
Implantable Lead Model: 7121
Implantable Pulse Generator Implant Date: 20141009
Lead Channel Impedance Value: 342 Ohm
Lead Channel Impedance Value: 456 Ohm
Lead Channel Pacing Threshold Amplitude: 0.75 V
Lead Channel Pacing Threshold Pulse Width: 0.4 ms
Lead Channel Sensing Intrinsic Amplitude: 9 mV
Lead Channel Sensing Intrinsic Amplitude: 9 mV
Lead Channel Setting Pacing Amplitude: 2.5 V
Lead Channel Setting Pacing Pulse Width: 0.4 ms
Lead Channel Setting Sensing Sensitivity: 0.45 mV

## 2020-01-24 NOTE — Progress Notes (Signed)
ICD Remote  

## 2020-01-28 ENCOUNTER — Other Ambulatory Visit (HOSPITAL_COMMUNITY): Payer: Self-pay | Admitting: Internal Medicine

## 2020-01-28 MED FILL — ENTRESTO 97 MG-103 MG TAB: 97-103 | 30 days supply | Qty: 60 | Fill #6

## 2020-01-28 MED FILL — TRUEPLUS PEN NDL 32GX5/32": 32G X 4 MM | 90 days supply | Qty: 100 | Fill #1

## 2020-01-28 MED FILL — POTASSIUM CL ER 20 MEQ TABL: 20 | 30 days supply | Qty: 120 | Fill #3

## 2020-01-28 MED FILL — LANTUS SOLOSTAR 100 UNITS/M: 100 | 50 days supply | Qty: 15 | Fill #4

## 2020-01-28 MED FILL — TRUEPLUS PEN NDL 32GX5/32: 32G X 4 MM | 90 days supply | Qty: 100 | Fill #1

## 2020-01-28 MED FILL — ATORVASTATIN CALCIUM 40 MG: 40 | 30 days supply | Qty: 30 | Fill #2

## 2020-01-28 MED FILL — BIDIL 20-37.5 MG TABS: 20-37.5 | 30 days supply | Qty: 90 | Fill #1

## 2020-01-28 MED FILL — TORSEMIDE 100 MG TABLET: 100 | 30 days supply | Qty: 45 | Fill #3

## 2020-01-28 MED FILL — CARVEDILOL 12.5 MG TABLET: 12.5 | 90 days supply | Qty: 180 | Fill #0

## 2020-01-28 MED FILL — FARXIGA 10 MG TABLET: 10 | 30 days supply | Qty: 30 | Fill #4

## 2020-02-21 ENCOUNTER — Ambulatory Visit: Payer: Medicaid Other | Admitting: Nurse Practitioner

## 2020-02-28 ENCOUNTER — Ambulatory Visit: Payer: Medicaid Other | Admitting: Nurse Practitioner

## 2020-02-28 ENCOUNTER — Ambulatory Visit (HOSPITAL_COMMUNITY)
Admission: RE | Admit: 2020-02-28 | Discharge: 2020-02-28 | Disposition: A | Discharge status: Home / Self Care | Payer: Medicaid Other | Source: Ambulatory Visit | Attending: Cardiology | Admitting: Cardiology

## 2020-02-28 ENCOUNTER — Encounter (HOSPITAL_COMMUNITY): Payer: Self-pay

## 2020-02-28 ENCOUNTER — Other Ambulatory Visit: Payer: Self-pay

## 2020-02-28 ENCOUNTER — Other Ambulatory Visit (HOSPITAL_COMMUNITY): Payer: Self-pay | Admitting: Cardiology

## 2020-02-28 VITALS — BP 110/68 | HR 93 | Wt 322.0 lb

## 2020-02-28 DIAGNOSIS — Z79899 Other long term (current) drug therapy: Secondary | ICD-10-CM | POA: Diagnosis not present

## 2020-02-28 DIAGNOSIS — Z8616 Personal history of COVID-19: Secondary | ICD-10-CM | POA: Diagnosis not present

## 2020-02-28 DIAGNOSIS — F17210 Nicotine dependence, cigarettes, uncomplicated: Secondary | ICD-10-CM | POA: Diagnosis not present

## 2020-02-28 DIAGNOSIS — Z794 Long term (current) use of insulin: Secondary | ICD-10-CM | POA: Diagnosis not present

## 2020-02-28 DIAGNOSIS — Z888 Allergy status to other drugs, medicaments and biological substances status: Secondary | ICD-10-CM | POA: Diagnosis not present

## 2020-02-28 DIAGNOSIS — E119 Type 2 diabetes mellitus without complications: Secondary | ICD-10-CM | POA: Insufficient documentation

## 2020-02-28 DIAGNOSIS — Z8249 Family history of ischemic heart disease and other diseases of the circulatory system: Secondary | ICD-10-CM | POA: Diagnosis not present

## 2020-02-28 DIAGNOSIS — I428 Other cardiomyopathies: Secondary | ICD-10-CM | POA: Diagnosis not present

## 2020-02-28 DIAGNOSIS — Z95810 Presence of automatic (implantable) cardiac defibrillator: Secondary | ICD-10-CM | POA: Diagnosis not present

## 2020-02-28 DIAGNOSIS — I5042 Chronic combined systolic (congestive) and diastolic (congestive) heart failure: Secondary | ICD-10-CM | POA: Diagnosis not present

## 2020-02-28 DIAGNOSIS — I110 Hypertensive heart disease with heart failure: Secondary | ICD-10-CM | POA: Insufficient documentation

## 2020-02-28 DIAGNOSIS — Z7982 Long term (current) use of aspirin: Secondary | ICD-10-CM | POA: Insufficient documentation

## 2020-02-28 DIAGNOSIS — Z6839 Body mass index (BMI) 39.0-39.9, adult: Secondary | ICD-10-CM | POA: Diagnosis not present

## 2020-02-28 DIAGNOSIS — I5022 Chronic systolic (congestive) heart failure: Secondary | ICD-10-CM | POA: Insufficient documentation

## 2020-02-28 DIAGNOSIS — I472 Ventricular tachycardia: Secondary | ICD-10-CM | POA: Diagnosis not present

## 2020-02-28 DIAGNOSIS — Z880 Allergy status to penicillin: Secondary | ICD-10-CM | POA: Insufficient documentation

## 2020-02-28 DIAGNOSIS — E0843 Diabetes mellitus due to underlying condition with diabetic autonomic (poly)neuropathy: Secondary | ICD-10-CM | POA: Diagnosis not present

## 2020-02-28 DIAGNOSIS — F1410 Cocaine abuse, uncomplicated: Secondary | ICD-10-CM | POA: Insufficient documentation

## 2020-02-28 DIAGNOSIS — E669 Obesity, unspecified: Secondary | ICD-10-CM | POA: Insufficient documentation

## 2020-02-28 LAB — BASIC METABOLIC PANEL
Anion gap: 14 (ref 5–15)
BUN: 41 mg/dL — ABNORMAL HIGH (ref 6–20)
CO2: 24 mmol/L (ref 22–32)
Calcium: 9.4 mg/dL (ref 8.9–10.3)
Chloride: 98 mmol/L (ref 98–111)
Creatinine, Ser: 1.68 mg/dL — ABNORMAL HIGH (ref 0.61–1.24)
GFR calc Af Amer: 56 mL/min — ABNORMAL LOW (ref >60)
GFR calc non Af Amer: 48 mL/min — ABNORMAL LOW (ref >60)
Glucose, Bld: 170 mg/dL — ABNORMAL HIGH (ref 70–99)
Potassium: 4.6 mmol/L (ref 3.5–5.1)
Sodium: 136 mmol/L (ref 135–145)

## 2020-02-28 MED ORDER — ENTRESTO 97-103 MG PO TABS: 1.0000 | ORAL_TABLET | Freq: Two times a day (BID) | ORAL | 11 refills | Status: DC

## 2020-02-28 MED ORDER — SPIRONOLACTONE 25 MG PO TABS: 25.0000 mg | ORAL_TABLET | Freq: Every day | ORAL | 2 refills | Status: DC

## 2020-02-28 MED ORDER — TORSEMIDE 100 MG PO TABS: ORAL_TABLET | ORAL | 6 refills | Status: DC

## 2020-02-28 MED ORDER — FARXIGA 10 MG PO TABS: 10.0000 mg | ORAL_TABLET | Freq: Every day | ORAL | 6 refills | Status: DC

## 2020-02-28 MED ORDER — ACCU-CHEK GUIDE VI STRP: ORAL_STRIP | 12 refills | Status: DC

## 2020-02-28 MED FILL — TORSEMIDE 100 MG TABLET: 100 | 30 days supply | Qty: 45 | Fill #0

## 2020-02-28 MED FILL — SPIRONOLACTONE 25 MG TABLET: 25 | 30 days supply | Qty: 30 | Fill #0

## 2020-02-28 MED FILL — FARXIGA 10 MG TABLET: 10 | 30 days supply | Qty: 30 | Fill #0

## 2020-02-28 MED FILL — ACCU-CHEK GUIDE TEST STRIP: 50 days supply | Qty: 100 | Fill #0

## 2020-02-28 NOTE — Progress Notes (Signed)
ADVANCED HF CLINIC NOTE  Patient ID: Douglas [name], male   DOB: 1976-06-22, 44 y.o.   MRN: [id number]  EP: Dr Graciela Husbands Primary Cardiologist: Dr. [name] PCP: Bertram Denver NP  History of Present Illness: Douglas [name] is a 44 y.o. male with a h/o HTN, HL, obesity, smoker, cocaine abuse,  NICM and chronic systolic heart failure. He is S/P Medtronic ICD due to NICM. Management has been complicated by severe non-compliance.   Underwent cath in 2006 which showed normal cors with EF 20-25%.   EF had recovered in late 2013 but has again fallen to 15% per echo 01/20/13.  He was admitted to [facility] 02/21/13 from clinic for low output symptoms and volume overload.  He required IV lasix and metolazone.  He diuresed 19 pounds with discharge weight of 254 pounds.  He had runs of VT therefore he was started on amiodarone per EP and Lifevest was placed at discharge.  He underwent RHC on 02/22/13.  He has not required metolazone.   He was admitted in 1/16 with VT and ICD shock x 2.   Echo 6/20 -->EF 25-30%   He was COVID + 11/14/20. He quarantined and recovered.   Last clinic appt was 1/21. Was doing well at that time. Volume status was stable. Unfortunately he was still smoking cigarettes and admitted to recent cocaine use. Per progress note, we were not able to obtain device interrogation at that visit.   He returns back to clinic today for f/u. Attempt was made to interrogate device again, but error messages states "device not supported". Per chart review, he had a remote interrogation done 01/24/20 read by Dr. Graciela Husbands. This showed normal functioning. Battery status was good. Lead measurements unchanged and histograms were appropriate. 2 short NSVT events were detected. No sustained VT.   He reports that he is doing well from a symptom standpoint. No resting nor exertional symptoms w/ day to day activities. NYHA Class II. Denies LEE. No orthopnea/ PND. No chest pain. Reports good med compliance. He has  been more active, in an effort to lose wt. He has lost 25 lb since Nov 2020. Unfortunately he continues to smoke but has cut down significantly and also continues to use cocaine. Last used 3 weeks ago, per his report. He has been trying to quit completely    ICD interrogation Attempted: unable to obtain interrogation (see HPI above).   RHC 02/22/13  RA = 12  RV = 49/6/15  PA = 54/29 (41)  PCW = 28 (v= 40)  Fick cardiac output/index = 4.3/1.7  PVR = 2.6 Woods  SVR = 1580 dynes  FA sat = 98%  PA sat = 53%, 54%  Non-invasive BP = 124/86 (97)  RHC 05/03/13 RA = 2  RV = 29/2/2  PA = 26/10 (17)  PCW = 3  Fick cardiac output/index = 6.1/2.3  PVR = 2.3  FA sat = 93%  PA sat = 65%, 67%  ECHO 06/25/13 EF 20-25% ECHO 05/01/14 EF 35-40%, moderately dilated LV, mild LVH, diffuse hypokinesis Echo 6/16 EF 25-30%, mild LVH, moderately dilated LV.  ECHO 09/23/2016 EF 25-30%. ECHO 08/19/2017 EF 40-45% grade IDD  August 2012 CPX VO2 20 SH: lives with his sister. Rare ETOH, smokes rarely. Remote cocaine. FH: Mom has CAD.    Review of systems complete and found to be negative unless listed in HPI.   Past Medical History:  Diagnosis Date  . Depression, major, single episode 01/14/2017  .  Diabetes mellitus (HCC)   . ED (erectile dysfunction)   . History of syncope 01/29/2015  . Hypertension   . 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 Asymptomatic.   Marland Kitchen Nonischemic cardiomyopathy (HCC)    a.  echo 4/06: EF 30%, mild to mod MR, mild RAE, inf HK, lat HK , ant HK;    b.  cath 4/06: no CAD, EF 20-25%  . NSVT (nonsustained ventricular tachycardia) (HCC)   . Obesity   . Systolic CHF, chronic (HCC)    EF 11/17 25-30%, s/p ICD    Current Outpatient Medications  Medication Sig Dispense Refill  . Accu-Chek FastClix Lancets MISC Use as instructed. Inject into the skin twice daily E11.8 100 each 3  . acetaminophen (TYLENOL) 500 MG tablet Take 1 tablet (500 mg  total) by mouth every 6 (six) hours as needed. 30 tablet 0  . aspirin EC 81 MG EC tablet Take 1 tablet (81 mg total) by mouth daily. 30 tablet 0  . atorvastatin (LIPITOR) 40 MG tablet TAKE 1 TABLET (40 MG TOTAL) BY MOUTH DAILY. 30 tablet 2  . Blood Glucose Calibration (ACCU-CHEK GUIDE CONTROL) LIQD 1 each by In Vitro route once as needed for up to 1 dose. 1 each 0  . Blood Pressure Monitor DEVI Please provide patient with insurance approved blood pressure monitor 1 Device 0  . carvedilol (COREG) 12.5 MG tablet TAKE 1 TABLET (12.5 MG TOTAL) BY MOUTH 2 (TWO) TIMES DAILY WITH A MEAL. 180 tablet 3  . dapagliflozin propanediol (FARXIGA) 10 MG TABS tablet Take 10 mg by mouth daily before breakfast. 30 tablet 6  . glucose blood (ACCU-CHEK GUIDE) test strip Use as instructed. Check blood glucose by fingerstick twice per day. E11.8 100 each 12  . ibuprofen (ADVIL,MOTRIN) 600 MG tablet Take 1 tablet (600 mg total) by mouth every 6 (six) hours as needed. 30 tablet 0  . Insulin Glargine (LANTUS) 100 UNIT/ML Solostar Pen Inject 30 Units into the skin daily at 10 pm. (Patient taking differently: Inject 36 Units into the skin daily at 10 pm. ) 15 mL 11  . Insulin Pen Needle (BD PEN NEEDLE NANO U/F) 32G X 4 MM MISC Inject 1 application into the skin at bedtime. The patient is insulin requiring, ICD 10 code E11.9. The patient injects 1 times per day. 100 each 3  . isosorbide-hydrALAZINE (BIDIL) 20-37.5 MG tablet Take 1 tablet by mouth 3 (three) times daily. 90 tablet 3  . metolazone (ZAROXOLYN) 2.5 MG tablet Take 1 tablet (2.5 mg total) by mouth as directed. 5 tablet 0  . potassium chloride SA (KLOR-CON) 20 MEQ tablet Take 2 tablets (40 mEq total) by mouth 2 (two) times daily. 120 tablet 6  . sacubitril-valsartan (ENTRESTO) 97-103 MG Take 1 tablet by mouth 2 (two) times daily. 60 tablet 11  . torsemide (DEMADEX) 100 MG tablet Take 1 tablet (100 mg total) by mouth every morning AND 0.5 tablets (50 mg total) every  evening. 45 tablet 6  . spironolactone (ALDACTONE) 25 MG tablet Take 0.5 tablets (12.5 mg total) by mouth daily. (Patient not taking: Reported on 12/06/2019) 45 tablet 3   No current facility-administered medications for this encounter.    Allergies  Allergen Reactions  . Bydureon [Exenatide] Rash  . Penicillins Other (See Comments)    Unknown childhood reaction     Vitals:   02/28/20 1500  BP: 110/68  Pulse: 93  SpO2: 98%  Weight: (!) 146.1 kg (322 lb)  Wt Readings from Last 3 Encounters:  02/28/20 (!) 146.1 kg (322 lb)  12/06/19 (!) 152.3 kg (335 lb 12.8 oz)  10/01/19 (!) 157.7 kg (347 lb 9.6 oz)   PHYSICAL EXAM: General:  Well appearing, obese AAM. No respiratory difficulty HEENT: normal Neck: supple. no JVD. Carotids 2+ bilat; no bruits. No lymphadenopathy or thyromegaly appreciated. Cor: PMI nondisplaced. Regular rate & rhythm. No rubs, gallops or murmurs. Lungs: clear Abdomen: obese, soft, nontender, nondistended. No hepatosplenomegaly. No bruits or masses. Good bowel sounds. Extremities: no cyanosis, clubbing, rash, edema Neuro: alert & oriented x 3, cranial nerves grossly intact. moves all 4 extremities w/o difficulty. Affect pleasant.   1) Chronic systolic HF: Nonischemic cardiomyopathy. S/p Medtronic ICD  - Echo 09/2016 with EF 23-55%, RV systolic function mildly reduced - ECHO 08/19/2017 EF 40-45%. - Echo 6/20 EF 25-30%  -NYHA II. Volume status stable.  - Continue torsemide 100/50 mg daily.   - Continue carvediol 12.5 mg twice a day.  - Continue Bidil 1 tab TID - Continue entresto 97/103 twice a day.   -Continue Farxiga 10 mg daily  - Increase spiro to 25 mg daily. - Check BMET today   2) HTN:  -  Controlled on current regimen   3) VT:  - Quiescent. Off amio x 1 year - unable to get device interrogation today (see above) - most recent remove interrogation 01/24/20 showed only 2 brief runs of NSVT   4) Cocaine abuse:   - Recently used ~ 3 weeks ago.  Discussed importance of complete cessation and risk of heart attack/ cardiac arrest w/ use   5) Tobacco Abuse - Discussed cessation  6) DMII- on insulin. - last hgb A1c 7.5 - Continue Farxiga 10 mg daily.  - has f/u w/ PCP tomorrow   7. Obesity Body mass index is 39.2 kg/m. - he is making efforts to lose wt. Has increased activity and has lost 25 lb since 11/20.  - he was encouraged to stay active and congratulated on his efforts   F/u in 3 months    Jasaun Carn [place], [place]  02/28/2020

## 2020-02-28 NOTE — Patient Instructions (Signed)
INCREASE Spironolactone to 25 mg, one tab daily  Labs today We will only contact you if something comes back abnormal or we need to make some changes. Otherwise no news is good news!  Your physician recommends that you schedule a follow-up appointment in: 3 months with Dr [name]  Do the following things EVERYDAY: 1) Weigh yourself in the morning before breakfast. Write it down and keep it in a log. 2) Take your medicines as prescribed 3) Eat low salt foods--Limit salt (sodium) to 2000 mg per day.  4) Stay as active as you can everyday 5) Limit all fluids for the day to less than 2 liters  At the [facility], you and your health needs are our priority. As part of our continuing mission to provide you with exceptional heart care, we have created designated Provider Care Teams. These Care Teams include your primary Cardiologist (physician) and Advanced Practice Providers (APPs- Physician Assistants and Nurse Practitioners) who all work together to provide you with the care you need, when you need it.   You may see any of the following providers on your designated Care Team at your next follow up: Marland Kitchen Dr [name] . Dr Marca Ancona . Tonye Becket, NP . Robbie Lis, PA . Karle Plumber, PharmD   Please be sure to bring in all your medications bottles to every appointment.

## 2020-02-29 ENCOUNTER — Encounter: Payer: Self-pay | Admitting: Nurse Practitioner

## 2020-02-29 ENCOUNTER — Telehealth (HOSPITAL_COMMUNITY): Payer: Self-pay | Admitting: Cardiology

## 2020-02-29 ENCOUNTER — Ambulatory Visit: Payer: Medicaid Other | Attending: Nurse Practitioner | Admitting: Nurse Practitioner

## 2020-02-29 ENCOUNTER — Other Ambulatory Visit: Payer: Self-pay | Admitting: Nurse Practitioner

## 2020-02-29 VITALS — BP 109/62 | HR 86 | Ht 76.0 in | Wt 325.0 lb

## 2020-02-29 DIAGNOSIS — Z130 Encounter for screening for diseases of the blood and blood-forming organs and certain disorders involving the immune mechanism: Secondary | ICD-10-CM

## 2020-02-29 DIAGNOSIS — E1122 Type 2 diabetes mellitus with diabetic chronic kidney disease: Secondary | ICD-10-CM

## 2020-02-29 DIAGNOSIS — N182 Chronic kidney disease, stage 2 (mild): Secondary | ICD-10-CM | POA: Diagnosis not present

## 2020-02-29 DIAGNOSIS — I10 Essential (primary) hypertension: Secondary | ICD-10-CM | POA: Diagnosis not present

## 2020-02-29 DIAGNOSIS — Z720 Tobacco use: Secondary | ICD-10-CM | POA: Diagnosis not present

## 2020-02-29 DIAGNOSIS — E782 Mixed hyperlipidemia: Secondary | ICD-10-CM | POA: Diagnosis not present

## 2020-02-29 LAB — GLUCOSE, POCT (MANUAL RESULT ENTRY): POC Glucose: 199 mg/dl — AB (ref 70–99)

## 2020-02-29 LAB — POCT GLYCOSYLATED HEMOGLOBIN (HGB A1C): HbA1c, POC (controlled diabetic range): 7.9 % — AB (ref 0.0–7.0)

## 2020-02-29 MED ORDER — ATORVASTATIN CALCIUM 40 MG PO TABS: 40.0000 mg | ORAL_TABLET | Freq: Every day | ORAL | 2 refills | Status: DC

## 2020-02-29 MED ORDER — INSULIN GLARGINE 100 UNIT/ML SOLOSTAR PEN: 36.0000 [IU] | PEN_INJECTOR | Freq: Every day | SUBCUTANEOUS | 6 refills | Status: DC

## 2020-02-29 MED FILL — ATORVASTATIN CALCIUM 40 MG: 40 | 90 days supply | Qty: 90 | Fill #0

## 2020-02-29 MED FILL — LANTUS SOLOSTAR 100 UNITS/M: 100 | 83 days supply | Qty: 30 | Fill #0

## 2020-02-29 NOTE — Telephone Encounter (Signed)
-----   Message from Allayne Butcher, [place] sent at 02/28/2020  5:46 PM EDT ----- Regarding: Reduce Amiodarone Let pt know that he can reduce his amiodarone down to 200 mg once daily. This was recommended to do a this return f/u if he was still in NSR. I forgot to instruct him to do this.

## 2020-02-29 NOTE — Progress Notes (Signed)
Assessment & Plan:  Douglas [name] was seen today for diabetes.  Diagnoses and all orders for this visit:  Diabetes mellitus with stage 2 chronic kidney disease (HCC) -     POCT glucose (manual entry) -     POCT glycosylated hemoglobin (Hb A1C) -     Ambulatory referral to Ophthalmology -     insulin glargine (LANTUS) 100 UNIT/ML Solostar Pen; Inject 36 Units into the skin daily at 10 pm. Continue blood sugar control as discussed in office today, low carbohydrate diet, and regular physical exercise as tolerated, 150 minutes per week (30 min each day, 5 days per week, or 50 min 3 days per week). Keep blood sugar logs with fasting goal of 90-130 mg/dl, post prandial (after you eat) less than 180.  For Hypoglycemia: BS <60 and Hyperglycemia BS >400; contact the clinic ASAP. Annual eye exams and foot exams are recommended.   Mixed hyperlipidemia -     Lipid panel -     atorvastatin (LIPITOR) 40 MG tablet; Take 1 tablet (40 mg total) by mouth daily. INSTRUCTIONS: Work on a low fat, heart healthy diet and participate in regular aerobic exercise program by working out at least 150 minutes per week; 5 days a week-30 minutes per day. Avoid red meat/beef/steak,  fried foods. junk foods, sodas, sugary drinks, unhealthy snacking, alcohol and smoking.  Drink at least 80 oz of water per day and monitor your carbohydrate intake daily.   Essential hypertension Continue all antihypertensives as prescribed.  Remember to bring in your blood pressure log with you for your follow up appointment.  DASH/Mediterranean Diets are healthier choices for HTN.    Tobacco use Douglas [name] was counseled on the dangers of tobacco use, and was advised to quit. Reviewed strategies to maximize success, including removing cigarettes and smoking materials from environment, stress management and support of family/friends as well as pharmacological alternatives including: Wellbutrin, Chantix, Nicotine patch, Nicotine gum or lozenges.  Smoking cessation support: smoking cessation hotline: 1-800-QUIT-NOW.  Smoking cessation classes are also available through [facility]. Call [phone] or visit our website at [email or web address].   A total of 3 minutes was spent on counseling for smoking cessation and Douglas [name] is not ready to quit.   Screening for deficiency anemia -     CBC    Patient has been counseled on age-appropriate routine health concerns for screening and prevention. These are reviewed and up-to-date. Referrals have been placed accordingly. Immunizations are up-to-date or declined.    Subjective:   Chief Complaint  Patient presents with  . Diabetes   [name] 44 y.o. male presents to office today for follow up.  has a past medical history of Depression, major, 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  HISTORY OF POOR COMPLIANCE Continues to use cocaine Seeing Cardiology for NICM and chronic systolic heart failure. He is S/P Medtronic ICD due to NICM. Most recent visit was yesterday.   DM TYPE 2 Monitoring 1-2 times per day. Taking Lantus 36 units daily.  Fasting readings: High as 200s. Instructed to increase Lantus by 2 units every 3 days for fasting blood glucose greater than 130. I have instructed him to call the office if he takes more than 50 units. Will start victoza. Also taking farxiga 10mg  daily which was was discontinued) Overdue for  eye exam. Referral placed.  Lab Results  Component Value Date   HGBA1C 7.9 (A) 02/29/2020   Lab Results  Component Value Date   HGBA1C 7.5 (H) 10/01/2019    Dyslipidemia LDL Not at goal of <70. He is taking atorvastatin 40 mg daily.  Lab  Results  Component Value Date   LDLCALC 117 (H) 09/24/2019   Essential Hypertension/CHF He does not monitor his blood pressure at home. Currently well controlled on carvedilol 12.5 mg twice daily, isosorbide-hydralazine 20-37.5 mg 3 times daily, Entresto 97-103 mg twice daily, spironolactone 25 mg daily, torsemide 100 mg every morning and 50 mg every afternoon and as needed metolazone 2.5 mg. Denies chest pain, increased or worsening shortness of breath, palpitations, lightheadedness, dizziness, headaches or worsening BLE edema. Weight is down 10 lbs since January. BP Readings from Last 3 Encounters:  02/29/20 109/62  02/28/20 110/68  12/06/19 110/72    Review of Systems  Constitutional: Negative for fever, malaise/fatigue and weight loss.  HENT: Negative.  Negative for nosebleeds.   Eyes: Negative.  Negative for blurred vision, double vision and photophobia.  Respiratory: Negative.  Negative for cough and shortness of breath.   Cardiovascular: Negative.  Negative for chest pain, palpitations and leg swelling.  Gastrointestinal: Negative.  Negative for heartburn, nausea and vomiting.  Musculoskeletal: Negative.  Negative for myalgias.  Neurological: Negative.  Negative for dizziness, focal weakness, seizures and headaches.  Psychiatric/Behavioral: Negative.  Negative for suicidal ideas.    Past Medical History:  Diagnosis Date  . Depression, major, single episode 01/14/2017  . Diabetes mellitus ([place])   . ED (erectile dysfunction)   . History of syncope 01/29/2015  . Hypertension   . 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 Asymptomatic.   Marland Kitchen Nonischemic cardiomyopathy ([place])    a.  echo 4/06: EF 30%, mild to mod MR, mild RAE, inf HK, lat HK , ant HK;    b.  cath 4/06: no CAD, EF 20-25%  . NSVT (nonsustained ventricular tachycardia) ([place])   . Obesity   . Systolic CHF, chronic ([place])    EF 11/17 25-30%, s/p ICD    Past Surgical History:   Procedure Laterality Date  . CARDIAC CATHETERIZATION  09/2011; 02/2013; 04/2013  . CARDIAC DEFIBRILLATOR PLACEMENT  08/23/2013  . IMPLANTABLE CARDIOVERTER DEFIBRILLATOR IMPLANT N/A 08/23/2013   Procedure: IMPLANTABLE CARDIOVERTER DEFIBRILLATOR IMPLANT;  Surgeon: Deboraha Sprang, MD;  Location: [facility] CATH LAB;  Service: Cardiovascular;  Laterality: N/A;  . LEFT AND RIGHT HEART CATHETERIZATION WITH CORONARY ANGIOGRAM N/A 09/20/2011   Procedure: LEFT AND RIGHT HEART CATHETERIZATION WITH CORONARY ANGIOGRAM;  Surgeon: Jolaine Artist, MD;  Location: [facility] CATH LAB;  Service: Cardiovascular;  Laterality: N/A;  . MULTIPLE EXTRACTIONS WITH ALVEOLOPLASTY N/A 01/26/2013   Procedure:  EXTRACION  TOOTH # 19 WITH ALVEOLOPLASTY;  Surgeon: Lenn Cal, DDS;  Location: [place];  Service: Oral Surgery;  Laterality: N/A;  . RIGHT HEART CATHETERIZATION N/A 02/22/2013   Procedure: RIGHT HEART CATH;  Surgeon: Jolaine Artist, MD;  Location: [facility] CATH LAB;  Service: Cardiovascular;  Laterality: N/A;  . RIGHT HEART CATHETERIZATION N/A 05/03/2013   Procedure: RIGHT HEART CATH;  Surgeon: Jolaine Artist, MD;  Location: [facility] CATH LAB;  Service: Cardiovascular;  Laterality: N/A;    Family History  Problem Relation Age of Onset  . Coronary artery disease Mother 88       s/p PCI  . Lung cancer Father   . Diabetes type II Maternal Uncle   .  Coronary artery disease Maternal Uncle   . Stroke Neg Hx   . Heart attack Neg Hx     Social History Reviewed with no changes to be made today.   Outpatient Medications Prior to Visit  Medication Sig Dispense Refill  . Accu-Chek FastClix Lancets MISC Use as instructed. Inject into the skin twice daily E11.8 100 each 3  . acetaminophen (TYLENOL) 500 MG tablet Take 1 tablet (500 mg total) by mouth every 6 (six) hours as needed. 30 tablet 0  . aspirin EC 81 MG EC tablet Take 1 tablet (81 mg total) by mouth daily. 30 tablet 0  . Blood Glucose Calibration (ACCU-CHEK GUIDE CONTROL) LIQD  1 each by In Vitro route once as needed for up to 1 dose. 1 each 0  . Blood Pressure Monitor DEVI Please provide patient with insurance approved blood pressure monitor 1 Device 0  . carvedilol (COREG) 12.5 MG tablet TAKE 1 TABLET (12.5 MG TOTAL) BY MOUTH 2 (TWO) TIMES DAILY WITH A MEAL. 180 tablet 3  . dapagliflozin propanediol (FARXIGA) 10 MG TABS tablet Take 10 mg by mouth daily before breakfast. 30 tablet 6  . glucose blood (ACCU-CHEK GUIDE) test strip Use as instructed. Check blood glucose by fingerstick twice per day. E11.8 100 each 12  . ibuprofen (ADVIL,MOTRIN) 600 MG tablet Take 1 tablet (600 mg total) by mouth every 6 (six) hours as needed. 30 tablet 0  . Insulin Pen Needle (BD PEN NEEDLE NANO U/F) 32G X 4 MM MISC Inject 1 application into the skin at bedtime. The patient is insulin requiring, ICD 10 code E11.9. The patient injects 1 times per day. 100 each 3  . isosorbide-hydrALAZINE (BIDIL) 20-37.5 MG tablet Take 1 tablet by mouth 3 (three) times daily. 90 tablet 3  . metolazone (ZAROXOLYN) 2.5 MG tablet Take 1 tablet (2.5 mg total) by mouth as directed. 5 tablet 0  . potassium chloride SA (KLOR-CON) 20 MEQ tablet Take 2 tablets (40 mEq total) by mouth 2 (two) times daily. 120 tablet 6  . sacubitril-valsartan (ENTRESTO) 97-103 MG Take 1 tablet by mouth 2 (two) times daily. 60 tablet 11  . spironolactone (ALDACTONE) 25 MG tablet Take 1 tablet (25 mg total) by mouth daily. 90 tablet 2  . torsemide (DEMADEX) 100 MG tablet Take 1 tablet (100 mg total) by mouth every morning AND 0.5 tablets (50 mg total) every evening. 45 tablet 6  . atorvastatin (LIPITOR) 40 MG tablet TAKE 1 TABLET (40 MG TOTAL) BY MOUTH DAILY. 30 tablet 2  . Insulin Glargine (LANTUS) 100 UNIT/ML Solostar Pen Inject 30 Units into the skin daily at 10 pm. (Patient taking differently: Inject 36 Units into the skin daily at 10 pm. ) 15 mL 11   No facility-administered medications prior to visit.    Allergies  Allergen  Reactions  . Bydureon [Exenatide] Rash  . Penicillins Other (See Comments)    Unknown childhood reaction        Objective:    BP 109/62   Pulse 86   Ht 6\' 4"  (1.93 m)   Wt (!) 325 lb (147.4 kg)   SpO2 98%   BMI 39.56 kg/m  Wt Readings from Last 3 Encounters:  02/29/20 (!) 325 lb (147.4 kg)  02/28/20 (!) 322 lb (146.1 kg)  12/06/19 (!) 335 lb 12.8 oz (152.3 kg)    Physical Exam Vitals and nursing note reviewed.  Constitutional:      Appearance: He is well-developed.  HENT:  Head: Normocephalic and atraumatic.  Cardiovascular:     Rate and Rhythm: Normal rate and regular rhythm.     Heart sounds: Normal heart sounds. No murmur. No friction rub. No gallop.   Pulmonary:     Effort: Pulmonary effort is normal. No tachypnea or respiratory distress.     Breath sounds: Normal breath sounds. No decreased breath sounds, wheezing, rhonchi or rales.  Chest:     Chest wall: No tenderness.  Abdominal:     General: Bowel sounds are normal.     Palpations: Abdomen is soft.  Musculoskeletal:        General: Normal range of motion.     Cervical back: Normal range of motion.  Skin:    General: Skin is warm and dry.  Neurological:     Mental Status: He is alert and oriented to person, place, and time.     Coordination: Coordination normal.  Psychiatric:        Behavior: Behavior normal. Behavior is cooperative.        Thought Content: Thought content normal.        Judgment: Judgment normal.          Patient has been counseled extensively about nutrition and exercise as well as the importance of adherence with medications and regular follow-up. The patient was given clear instructions to go to ER or return to medical center if symptoms don't improve, worsen or new problems develop. The patient verbalized understanding.   Follow-up: Return in about 3 months (around 05/30/2020).   Claiborne Rigg, FNP-BC [facility] [place],  [place] [phone]   02/29/2020, 2:57 PM

## 2020-03-03 LAB — CBC
Hematocrit: 49.2 % (ref 37.5–51.0)
Hemoglobin: 17.5 g/dL (ref 13.0–17.7)
MCH: 29.7 pg (ref 26.6–33.0)
MCHC: 35.6 g/dL (ref 31.5–35.7)
MCV: 83 fL (ref 79–97)
Platelets: 273 10*3/uL (ref 150–450)
RBC: 5.9 x10E6/uL — ABNORMAL HIGH (ref 4.14–5.80)
RDW: 14.2 % (ref 11.6–15.4)
WBC: 10.6 10*3/uL (ref 3.4–10.8)

## 2020-03-03 LAB — LIPID PANEL
Chol/HDL Ratio: 4.4 ratio (ref 0.0–5.0)
Cholesterol, Total: 158 mg/dL (ref 100–199)
HDL: 36 mg/dL — ABNORMAL LOW (ref >39)
LDL Chol Calc (NIH): 89 mg/dL (ref 0–99)
Triglycerides: 194 mg/dL — ABNORMAL HIGH (ref 0–149)
VLDL Cholesterol Cal: 33 mg/dL (ref 5–40)

## 2020-03-05 ENCOUNTER — Telehealth (HOSPITAL_COMMUNITY): Payer: Self-pay | Admitting: Cardiology

## 2020-03-05 ENCOUNTER — Other Ambulatory Visit (HOSPITAL_COMMUNITY): Payer: Self-pay | Admitting: Cardiology

## 2020-03-05 DIAGNOSIS — I5042 Chronic combined systolic (congestive) and diastolic (congestive) heart failure: Secondary | ICD-10-CM

## 2020-03-05 MED ORDER — TORSEMIDE 100 MG PO TABS: ORAL_TABLET | ORAL | 6 refills | Status: DC

## 2020-03-05 MED FILL — POTASSIUM CL ER 20 MEQ TABL: 20 | 30 days supply | Qty: 120 | Fill #4

## 2020-03-05 NOTE — Telephone Encounter (Signed)
-----   Message from Allayne Butcher, [place] sent at 03/04/2020  1:18 PM EDT ----- SCr/ BUN suggest dehydration. Stop evening dose of torsemide. Continue w/ 100 mg once daily. Repeat BMP in 1 week. Monitor wt closely on reduced dose of torsemide. If > 3 lb gain in 24 hr, take an extra 1/2 tablet of torsemide.

## 2020-03-05 NOTE — Telephone Encounter (Signed)
Pt aware and voiced understanding Repeat labs 03/13/20

## 2020-03-07 ENCOUNTER — Telehealth (HOSPITAL_COMMUNITY): Payer: Self-pay | Admitting: Pharmacist

## 2020-03-07 ENCOUNTER — Telehealth (HOSPITAL_COMMUNITY): Payer: Self-pay | Admitting: Cardiology

## 2020-03-07 NOTE — Telephone Encounter (Signed)
Patient Advocate Encounter   Received notification from [facility] Medicaid that prior authorization for Sherryll Burger is required.   PA submitted on [facility] Tracks Confirmation #: [id number] W Recipient ID:  [id number] O Status is pending   Will continue to follow.  Karle Plumber, PharmD, BCPS, BCCP, CPP Heart Failure Clinic Pharmacist [phone]

## 2020-03-07 NOTE — Telephone Encounter (Signed)
Patient called to report his entresto needs another PA PA sent to pharmacist for review Will provide samples until PA complete  Medication Samples have been provided to the patient.  Drug name: entresto       Strength: 49/51        Qty: 28  LOT: [id number]  Exp.Date: 04/2021  Dosing instructions: two tabs twice daily   The patient has been instructed regarding the correct time, dose, and frequency of taking this medication, including desired effects and most common side effects.   Magda Bernheim M 4:04 PM 03/07/2020

## 2020-03-10 NOTE — Telephone Encounter (Signed)
Advanced Heart Failure Patient Advocate Encounter  Prior Authorization for Sherryll Burger has been approved.    PA# [id number] Effective dates: 03/07/2020 - 03/02/2021  Patients co-pay is $3.00  Karle Plumber, PharmD, BCPS, BCCP, CPP Heart Failure Clinic Pharmacist [phone]

## 2020-03-12 MED FILL — ENTRESTO 97 MG-103 MG TAB: 97-103 | 30 days supply | Qty: 60 | Fill #0

## 2020-03-13 ENCOUNTER — Other Ambulatory Visit: Payer: Self-pay

## 2020-03-13 ENCOUNTER — Ambulatory Visit (HOSPITAL_COMMUNITY)
Admission: RE | Admit: 2020-03-13 | Discharge: 2020-03-13 | Disposition: A | Discharge status: Home / Self Care | Payer: Medicaid Other | Source: Ambulatory Visit | Attending: Cardiology | Admitting: Cardiology

## 2020-03-13 DIAGNOSIS — I5042 Chronic combined systolic (congestive) and diastolic (congestive) heart failure: Secondary | ICD-10-CM | POA: Insufficient documentation

## 2020-03-13 LAB — BASIC METABOLIC PANEL
Anion gap: 13 (ref 5–15)
BUN: 20 mg/dL (ref 6–20)
CO2: 24 mmol/L (ref 22–32)
Calcium: 9.1 mg/dL (ref 8.9–10.3)
Chloride: 103 mmol/L (ref 98–111)
Creatinine, Ser: 1.57 mg/dL — ABNORMAL HIGH (ref 0.61–1.24)
GFR calc Af Amer: >60 mL/min (ref >60)
GFR calc non Af Amer: 52 mL/min — ABNORMAL LOW (ref >60)
Glucose, Bld: 198 mg/dL — ABNORMAL HIGH (ref 70–99)
Potassium: 4.2 mmol/L (ref 3.5–5.1)
Sodium: 140 mmol/L (ref 135–145)

## 2020-03-13 NOTE — Telephone Encounter (Signed)
Spoke with patient regarding amiodarone, reports he was told to discontinue at last office visit with Dr [name]  Medication list does not have amiodarone listed

## 2020-04-03 ENCOUNTER — Other Ambulatory Visit (HOSPITAL_COMMUNITY): Payer: Self-pay | Admitting: Cardiology

## 2020-04-03 MED FILL — FARXIGA 10 MG TABLET: 10 | 30 days supply | Qty: 30 | Fill #5

## 2020-04-03 MED FILL — TORSEMIDE 100 MG TABLET: 100 | 30 days supply | Qty: 45 | Fill #4

## 2020-04-03 MED FILL — SPIRONOLACTONE 25 MG TABLET: 25 | 30 days supply | Qty: 30 | Fill #1

## 2020-04-04 ENCOUNTER — Other Ambulatory Visit (HOSPITAL_COMMUNITY): Payer: Self-pay | Admitting: Internal Medicine

## 2020-04-04 MED FILL — BIDIL 20-37.5 MG TABS: 20-37.5 | 90 days supply | Qty: 270 | Fill #0

## 2020-04-07 MED FILL — ENTRESTO 97 MG-103 MG TAB: 97-103 | 30 days supply | Qty: 60 | Fill #1

## 2020-04-23 ENCOUNTER — Ambulatory Visit (INDEPENDENT_AMBULATORY_CARE_PROVIDER_SITE_OTHER): Payer: Medicaid Other | Admitting: *Deleted

## 2020-04-23 DIAGNOSIS — I428 Other cardiomyopathies: Secondary | ICD-10-CM

## 2020-04-23 LAB — CUP PACEART REMOTE DEVICE CHECK
Battery Remaining Longevity: 49 mo
Battery Voltage: 2.97 V
Brady Statistic RV Percent Paced: 0.01 %
Date Time Interrogation Session: 20210609092459
HighPow Impedance: 51 Ohm
HighPow Impedance: 67 Ohm
Implantable Lead Implant Date: 20141009
Implantable Lead Location: 753860
Implantable Lead Model: 7121
Implantable Pulse Generator Implant Date: 20141009
Lead Channel Impedance Value: 323 Ohm
Lead Channel Impedance Value: 437 Ohm
Lead Channel Pacing Threshold Amplitude: 0.75 V
Lead Channel Pacing Threshold Pulse Width: 0.4 ms
Lead Channel Sensing Intrinsic Amplitude: 10.375 mV
Lead Channel Sensing Intrinsic Amplitude: 10.375 mV
Lead Channel Setting Pacing Amplitude: 2.5 V
Lead Channel Setting Pacing Pulse Width: 0.4 ms
Lead Channel Setting Sensing Sensitivity: 0.45 mV

## 2020-04-24 DIAGNOSIS — H11153 Pinguecula, bilateral: Secondary | ICD-10-CM | POA: Diagnosis not present

## 2020-04-24 DIAGNOSIS — H40013 Open angle with borderline findings, low risk, bilateral: Secondary | ICD-10-CM | POA: Diagnosis not present

## 2020-04-24 DIAGNOSIS — H02831 Dermatochalasis of right upper eyelid: Secondary | ICD-10-CM | POA: Diagnosis not present

## 2020-04-24 DIAGNOSIS — H18413 Arcus senilis, bilateral: Secondary | ICD-10-CM | POA: Diagnosis not present

## 2020-04-24 DIAGNOSIS — H11823 Conjunctivochalasis, bilateral: Secondary | ICD-10-CM | POA: Diagnosis not present

## 2020-04-24 DIAGNOSIS — H02834 Dermatochalasis of left upper eyelid: Secondary | ICD-10-CM | POA: Diagnosis not present

## 2020-04-24 DIAGNOSIS — H40053 Ocular hypertension, bilateral: Secondary | ICD-10-CM | POA: Diagnosis not present

## 2020-04-24 LAB — HM DIABETES EYE EXAM

## 2020-04-24 NOTE — Progress Notes (Signed)
Remote ICD transmission.   

## 2020-05-05 MED FILL — POTASSIUM CL ER 20 MEQ TABL: 20 | 30 days supply | Qty: 120 | Fill #5

## 2020-05-05 MED FILL — SPIRONOLACTONE 25 MG TABLET: 25 | 90 days supply | Qty: 90 | Fill #2

## 2020-05-05 MED FILL — FARXIGA 10 MG TABLET: 10 | 30 days supply | Qty: 30 | Fill #6

## 2020-05-05 MED FILL — ENTRESTO 97 MG-103 MG TAB: 97-103 | 30 days supply | Qty: 60 | Fill #2

## 2020-05-05 MED FILL — TORSEMIDE 100 MG TABLET: 100 | 90 days supply | Qty: 45 | Fill #5

## 2020-05-20 MED FILL — ACCU-CHEK GUIDE TEST STRIP: 34 days supply | Qty: 100 | Fill #1

## 2020-05-20 MED FILL — CARVEDILOL 12.5 MG TABLET: 12.5 | 90 days supply | Qty: 180 | Fill #1

## 2020-05-20 MED FILL — LANTUS SOLOSTAR 100 UNITS/M: 100 | 83 days supply | Qty: 30 | Fill #1

## 2020-05-21 MED FILL — BD PEN NDL NANO 32GX5/32: 32G X 4 MM | 30 days supply | Qty: 100 | Fill #2

## 2020-06-03 ENCOUNTER — Other Ambulatory Visit: Payer: Self-pay | Admitting: Nurse Practitioner

## 2020-06-03 DIAGNOSIS — E782 Mixed hyperlipidemia: Secondary | ICD-10-CM

## 2020-06-03 MED FILL — FARXIGA 10 MG TABLET: 10 | 30 days supply | Qty: 30 | Fill #1

## 2020-06-03 MED FILL — ATORVASTATIN CALCIUM 40 MG: 40 | 90 days supply | Qty: 90 | Fill #0

## 2020-06-03 MED FILL — ENTRESTO 97 MG-103 MG TAB: 97-103 | 30 days supply | Qty: 60 | Fill #3

## 2020-06-03 MED FILL — TORSEMIDE 100 MG TABLET: 100 | 30 days supply | Qty: 45 | Fill #6

## 2020-06-03 MED FILL — POTASSIUM CL ER 20 MEQ TABL: 20 | 30 days supply | Qty: 120 | Fill #6

## 2020-06-11 ENCOUNTER — Ambulatory Visit (HOSPITAL_COMMUNITY)
Admission: RE | Admit: 2020-06-11 | Discharge: 2020-06-11 | Disposition: A | Discharge status: Home / Self Care | Payer: Medicaid Other | Source: Ambulatory Visit | Attending: Internal Medicine | Admitting: Internal Medicine

## 2020-06-11 ENCOUNTER — Encounter: Payer: Self-pay | Admitting: Internal Medicine

## 2020-06-11 ENCOUNTER — Ambulatory Visit: Payer: Medicaid Other | Admitting: Nurse Practitioner

## 2020-06-11 ENCOUNTER — Other Ambulatory Visit: Payer: Self-pay

## 2020-06-11 VITALS — BP 115/85 | HR 75 | Wt 326.4 lb

## 2020-06-11 DIAGNOSIS — E119 Type 2 diabetes mellitus without complications: Secondary | ICD-10-CM | POA: Insufficient documentation

## 2020-06-11 DIAGNOSIS — Z720 Tobacco use: Secondary | ICD-10-CM

## 2020-06-11 DIAGNOSIS — Z791 Long term (current) use of non-steroidal anti-inflammatories (NSAID): Secondary | ICD-10-CM | POA: Diagnosis not present

## 2020-06-11 DIAGNOSIS — F1490 Cocaine use, unspecified, uncomplicated: Secondary | ICD-10-CM | POA: Diagnosis not present

## 2020-06-11 DIAGNOSIS — E785 Hyperlipidemia, unspecified: Secondary | ICD-10-CM | POA: Diagnosis not present

## 2020-06-11 DIAGNOSIS — M549 Dorsalgia, unspecified: Secondary | ICD-10-CM | POA: Diagnosis not present

## 2020-06-11 DIAGNOSIS — Z9119 Patient's noncompliance with other medical treatment and regimen: Secondary | ICD-10-CM | POA: Diagnosis not present

## 2020-06-11 DIAGNOSIS — Z7982 Long term (current) use of aspirin: Secondary | ICD-10-CM | POA: Diagnosis not present

## 2020-06-11 DIAGNOSIS — Z6839 Body mass index (BMI) 39.0-39.9, adult: Secondary | ICD-10-CM | POA: Insufficient documentation

## 2020-06-11 DIAGNOSIS — Z79899 Other long term (current) drug therapy: Secondary | ICD-10-CM | POA: Insufficient documentation

## 2020-06-11 DIAGNOSIS — Z8616 Personal history of COVID-19: Secondary | ICD-10-CM | POA: Diagnosis not present

## 2020-06-11 DIAGNOSIS — I10 Essential (primary) hypertension: Secondary | ICD-10-CM | POA: Diagnosis not present

## 2020-06-11 DIAGNOSIS — I5022 Chronic systolic (congestive) heart failure: Secondary | ICD-10-CM | POA: Insufficient documentation

## 2020-06-11 DIAGNOSIS — F17210 Nicotine dependence, cigarettes, uncomplicated: Secondary | ICD-10-CM | POA: Diagnosis not present

## 2020-06-11 DIAGNOSIS — E669 Obesity, unspecified: Secondary | ICD-10-CM | POA: Diagnosis not present

## 2020-06-11 DIAGNOSIS — Z8249 Family history of ischemic heart disease and other diseases of the circulatory system: Secondary | ICD-10-CM | POA: Diagnosis not present

## 2020-06-11 DIAGNOSIS — Z95810 Presence of automatic (implantable) cardiac defibrillator: Secondary | ICD-10-CM | POA: Insufficient documentation

## 2020-06-11 DIAGNOSIS — Z794 Long term (current) use of insulin: Secondary | ICD-10-CM | POA: Diagnosis not present

## 2020-06-11 DIAGNOSIS — I472 Ventricular tachycardia: Secondary | ICD-10-CM | POA: Diagnosis not present

## 2020-06-11 DIAGNOSIS — Z880 Allergy status to penicillin: Secondary | ICD-10-CM | POA: Insufficient documentation

## 2020-06-11 DIAGNOSIS — I428 Other cardiomyopathies: Secondary | ICD-10-CM | POA: Diagnosis not present

## 2020-06-11 DIAGNOSIS — I110 Hypertensive heart disease with heart failure: Secondary | ICD-10-CM | POA: Diagnosis not present

## 2020-06-11 LAB — BASIC METABOLIC PANEL
Anion gap: 10 (ref 5–15)
BUN: 22 mg/dL — ABNORMAL HIGH (ref 6–20)
CO2: 24 mmol/L (ref 22–32)
Calcium: 8.5 mg/dL — ABNORMAL LOW (ref 8.9–10.3)
Chloride: 103 mmol/L (ref 98–111)
Creatinine, Ser: 1.45 mg/dL — ABNORMAL HIGH (ref 0.61–1.24)
GFR calc Af Amer: >60 mL/min (ref >60)
GFR calc non Af Amer: 58 mL/min — ABNORMAL LOW (ref >60)
Glucose, Bld: 120 mg/dL — ABNORMAL HIGH (ref 70–99)
Potassium: 4.2 mmol/L (ref 3.5–5.1)
Sodium: 137 mmol/L (ref 135–145)

## 2020-06-11 LAB — URINALYSIS, ROUTINE W REFLEX MICROSCOPIC
Bacteria, UA: NONE SEEN
Bilirubin Urine: NEGATIVE
Glucose, UA: >=500 mg/dL — AB
Hgb urine dipstick: NEGATIVE
Ketones, ur: NEGATIVE mg/dL
Leukocytes,Ua: NEGATIVE
Nitrite: NEGATIVE
Protein, ur: NEGATIVE mg/dL
Specific Gravity, Urine: 1.005 (ref 1.005–1.030)
pH: 5 (ref 5.0–8.0)

## 2020-06-11 LAB — BRAIN NATRIURETIC PEPTIDE: B Natriuretic Peptide: 93.6 pg/mL (ref 0.0–100.0)

## 2020-06-11 NOTE — Patient Instructions (Signed)
Labs done today, we will call you with abnormal results  Please call our in December to schedule your follow up appointment  If you have any questions or concerns before your next appointment please send [place] a message through [place] or call our office at [phone].    TO LEAVE A MESSAGE FOR THE NURSE SELECT OPTION 2, PLEASE LEAVE A MESSAGE INCLUDING: . YOUR NAME . DATE OF BIRTH . CALL BACK NUMBER . REASON FOR CALL**this is important as we prioritize the call backs  YOU WILL RECEIVE A CALL BACK THE SAME DAY AS LONG AS YOU CALL BEFORE 4:00 PM  At the [facility], you and your health needs are our priority. As part of our continuing mission to provide you with exceptional heart care, we have created designated Provider Care Teams. These Care Teams include your primary Cardiologist (physician) and Advanced Practice Providers (APPs- Physician Assistants and Nurse Practitioners) who all work together to provide you with the care you need, when you need it.   You may see any of the following providers on your designated Care Team at your next follow up: Marland Kitchen Dr [name] . Dr Marca Ancona . Tonye Becket, NP . Robbie Lis, PA . Karle Plumber, PharmD   Please be sure to bring in all your medications bottles to every appointment.

## 2020-06-11 NOTE — Progress Notes (Signed)
ADVANCED HF CLINIC NOTE  Patient ID: Douglas [name], male   DOB: [birth year], 44 y.o.   MRN: [id number]  EP: Dr Graciela Husbands Primary Cardiologist: Dr. [name] PCP: Bertram Denver NP  History of Present Illness: Douglas [name] is a 44 y.o. male with a h/o HTN, HL, obesity, smoker, cocaine abuse,  NICM and chronic systolic heart failure. He is S/P Medtronic ICD due to NICM. Management has been complicated by severe non-compliance.   Underwent cath in 2006 which showed normal cors with EF 20-25%.   EF had recovered in late 2013 but has again fallen to 15% per echo 01/20/13.  He was admitted to [facility] 02/21/13 from clinic for low output symptoms and volume overload.  He required IV lasix and metolazone.  He diuresed 19 pounds with discharge weight of 254 pounds.  He had runs of VT therefore he was started on amiodarone per EP and Lifevest was placed at discharge.  He underwent RHC on 02/22/13.  He has not required metolazone.   He was admitted in 1/16 with VT and ICD shock x 2.   Echo 6/20 -->EF 25-30%   He was COVID + 11/14/20. He quarantined and recovered.   Here for routine f/u. Continues to smoke cigarettes (< 1/2 ppd) and uses cocaine ~1x/week. Says he's doing pretty good. Can walk at a slow pace without SOB or CP. If goes faster will develop a little SOB. No edema, orthopnea, PND. Complaint with meds. C/o back pain. Worried about his kidney. No fevers, chills, dysuria.    ICD interrogation:No VT/AF. Fluid climbing but not above threshold Activity 2hr/day.   RHC 02/22/13  RA = 12  RV = 49/6/15  PA = 54/29 (41)  PCW = 28 (v= 40)  Fick cardiac output/index = 4.3/1.7  PVR = 2.6 [place]  SVR = 1580 dynes  FA sat = 98%  PA sat = 53%, 54%  Non-invasive BP = 124/86 (97)  RHC 05/03/13 RA = 2  RV = 29/2/2  PA = 26/10 (17)  PCW = 3  Fick cardiac output/index = 6.1/2.3  PVR = 2.3  FA sat = 93%  PA sat = 65%, 67%  ECHO 06/25/13 EF 20-25% ECHO 05/01/14 EF 35-40%, moderately dilated LV,  mild LVH, diffuse hypokinesis Echo 6/16 EF 25-30%, mild LVH, moderately dilated LV.  ECHO 09/23/2016 EF 25-30%. ECHO 08/19/2017 EF 40-45% grade IDD  August 2012 CPX VO2 20 SH: lives with his sister. Rare ETOH, smokes rarely. Remote cocaine. FH: Mom has CAD.    Review of systems complete and found to be negative unless listed in HPI.   Past Medical History:  Diagnosis Date  . Depression, major, single episode 01/14/2017  . Diabetes mellitus (HCC)   . ED (erectile dysfunction)   . History of syncope 01/29/2015  . Hypertension   . 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 Asymptomatic.   Marland Kitchen Nonischemic cardiomyopathy (HCC)    a.  echo 4/06: EF 30%, mild to mod MR, mild RAE, inf HK, lat HK , ant HK;    b.  cath 4/06: no CAD, EF 20-25%  . NSVT (nonsustained ventricular tachycardia) (HCC)   . Obesity   . Systolic CHF, chronic (HCC)    EF 11/17 25-30%, s/p ICD    Current Outpatient Medications  Medication Sig Dispense Refill  . Accu-Chek FastClix Lancets MISC Use as instructed. Inject into the skin twice daily E11.8 100 each 3  . acetaminophen (TYLENOL) 500 MG tablet  Take 1 tablet (500 mg total) by mouth every 6 (six) hours as needed. 30 tablet 0  . aspirin EC 81 MG EC tablet Take 1 tablet (81 mg total) by mouth daily. 30 tablet 0  . atorvastatin (LIPITOR) 40 MG tablet TAKE 1 TABLET (40 MG TOTAL) BY MOUTH DAILY. 90 tablet 0  . Blood Glucose Calibration (ACCU-CHEK GUIDE CONTROL) LIQD 1 each by In Vitro route once as needed for up to 1 dose. 1 each 0  . Blood Pressure Monitor DEVI Please provide patient with insurance approved blood pressure monitor 1 Device 0  . carvedilol (COREG) 12.5 MG tablet TAKE 1 TABLET (12.5 MG TOTAL) BY MOUTH 2 (TWO) TIMES DAILY WITH A MEAL. 180 tablet 3  . dapagliflozin propanediol (FARXIGA) 10 MG TABS tablet Take 10 mg by mouth daily before breakfast. 30 tablet 6  . glucose blood (ACCU-CHEK GUIDE) test strip Use as instructed.  Check blood glucose by fingerstick twice per day. E11.8 100 each 12  . ibuprofen (ADVIL,MOTRIN) 600 MG tablet Take 1 tablet (600 mg total) by mouth every 6 (six) hours as needed. 30 tablet 0  . Insulin Pen Needle (BD PEN NEEDLE NANO U/F) 32G X 4 MM MISC Inject 1 application into the skin at bedtime. The patient is insulin requiring, ICD 10 code E11.9. The patient injects 1 times per day. 100 each 3  . isosorbide-hydrALAZINE (BIDIL) 20-37.5 MG tablet Take 1 tablet by mouth 3 (three) times daily. 270 tablet 3  . metolazone (ZAROXOLYN) 2.5 MG tablet Take 1 tablet (2.5 mg total) by mouth as directed. 5 tablet 0  . potassium chloride SA (KLOR-CON) 20 MEQ tablet Take 2 tablets (40 mEq total) by mouth 2 (two) times daily. 120 tablet 6  . sacubitril-valsartan (ENTRESTO) 97-103 MG Take 1 tablet by mouth 2 (two) times daily. 60 tablet 11  . spironolactone (ALDACTONE) 25 MG tablet Take 25 mg by mouth daily.    Marland Kitchen torsemide (DEMADEX) 100 MG tablet Take 1 tablet (100 mg total) by mouth every morning. May also take 0.5 tablets (50 mg total) at bedtime as needed. 45 tablet 6   No current facility-administered medications for this encounter.    Allergies  Allergen Reactions  . Bydureon [Exenatide] Rash  . Penicillins Other (See Comments)    Unknown childhood reaction     Vitals:   06/11/20 1403  BP: 115/85  Pulse: 75  SpO2: 97%  Weight: (!) 148.1 kg (326 lb 6.4 oz)   Wt Readings from Last 3 Encounters:  06/11/20 (!) 148.1 kg (326 lb 6.4 oz)  02/29/20 (!) 147.4 kg (325 lb)  02/28/20 (!) 146.1 kg (322 lb)   PHYSICAL EXAM: General:  Well appearing. No resp difficulty HEENT: normal Neck: supple. no JVD. Carotids 2+ bilat; no bruits. No lymphadenopathy or thryomegaly appreciated. Cor: PMI nondisplaced. Regular rate & rhythm. No rubs, gallops or murmurs. Lungs: clear Abdomen: obese soft, nontender, nondistended. No hepatosplenomegaly. No bruits or masses. Good bowel sounds. Extremities: no cyanosis,  clubbing, rash, edema Neuro: alert & orientedx3, cranial nerves grossly intact. moves all 4 extremities w/o difficulty. Affect pleasant   1) Chronic systolic HF: Nonischemic cardiomyopathy. S/p Medtronic ICD  - Echo 09/2016 with EF 25-30%, RV systolic function mildly reduced - ECHO 08/19/2017 EF 40-45%. - Echo 6/20 EF 25-30%  -NYHA II. Volume status slightly elevated. Taking torsemide 100/50. Will have him take 100 bid x 2 days - Continue torsemide 100/50 mg daily.   - Continue carvediol 12.5 mg twice a day.  -  Continue Bidil 1 tab BID (says he gets a HA if he takes more) - Continue entresto 97/103 twice a day.   -Continue Farxiga 10 mg daily  - Continue spiro 25 mg daily. - Check labs  - Repeat echo at next visit  2) HTN:  -  Controlled on current regimen   3) VT:  - Quiescent. Off amio x 1 year - No VT on device interrogation  - most recent remove interrogation 01/24/20 showed only 2 brief runs of NSVT   4) Cocaine abuse:   - Understands the need to quit     5) Tobacco Abuse - Understands the need to quit   6) DMII- on insulin. - last hgb A1c 7.5 - Continue Farxiga 10 mg daily.  - has f/u w/ PCP  7. Obesity Body mass index is 39.73 kg/m. - encouraged him to keep working on weight loss   8. Back pain. - will check UA at his request   [name], MD  06/11/2020

## 2020-06-11 NOTE — Addendum Note (Signed)
Encounter addended by: Noralee Space, RN on: 06/11/2020 2:33 PM  Actions taken: Visit diagnoses modified, Order list changed, Diagnosis association updated, Clinical Note Signed, Charge Capture section accepted

## 2020-06-12 LAB — URINE CULTURE: Culture: NO GROWTH

## 2020-07-04 ENCOUNTER — Other Ambulatory Visit (HOSPITAL_COMMUNITY): Payer: Self-pay | Admitting: Internal Medicine

## 2020-07-04 MED FILL — FARXIGA 10 MG TABLET: 10 | 30 days supply | Qty: 30 | Fill #2

## 2020-07-04 MED FILL — TORSEMIDE 100 MG TABLET: 100 | 30 days supply | Qty: 45 | Fill #0

## 2020-07-04 MED FILL — ENTRESTO 97 MG-103 MG TAB: 97-103 | 30 days supply | Qty: 60 | Fill #4

## 2020-07-07 ENCOUNTER — Other Ambulatory Visit (HOSPITAL_COMMUNITY): Payer: Self-pay | Admitting: Internal Medicine

## 2020-07-07 MED FILL — POTASSIUM CL ER 20 MEQ TAB: 20 | 30 days supply | Qty: 120 | Fill #0

## 2020-07-15 ENCOUNTER — Encounter: Payer: Self-pay | Admitting: Nurse Practitioner

## 2020-07-15 ENCOUNTER — Ambulatory Visit: Payer: Medicaid Other | Attending: Nurse Practitioner | Admitting: Nurse Practitioner

## 2020-07-15 DIAGNOSIS — N182 Chronic kidney disease, stage 2 (mild): Secondary | ICD-10-CM

## 2020-07-15 DIAGNOSIS — E1122 Type 2 diabetes mellitus with diabetic chronic kidney disease: Secondary | ICD-10-CM | POA: Diagnosis not present

## 2020-07-15 DIAGNOSIS — I10 Essential (primary) hypertension: Secondary | ICD-10-CM

## 2020-07-15 DIAGNOSIS — E782 Mixed hyperlipidemia: Secondary | ICD-10-CM | POA: Diagnosis not present

## 2020-07-15 NOTE — Progress Notes (Signed)
Virtual Visit via Telephone Note Due to national recommendations of social distancing due to COVID 19, telehealth visit is felt to be most appropriate for this patient at this time.  I discussed the limitations, risks, security and privacy concerns of performing an evaluation and management service by telephone and the availability of in person appointments. I also discussed with the patient that there may be a patient responsible charge related to this service. The patient expressed understanding and agreed to proceed.    I connected with [name] on 07/15/20  at  10:50 AM EDT  EDT by telephone and verified that I am speaking with the correct person using two identifiers.   Consent I discussed the limitations, risks, security and privacy concerns of performing an evaluation and management service by telephone and the availability of in person appointments. I also discussed with the patient that there may be a patient responsible charge related to this service. The patient expressed understanding and agreed to proceed.   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  Essential Hypertension Well controlled. He does continue to smoke. Trying to cut back. Last appt with Cardiology was 06-11-2020. He has a blood pressure monitor but does not use it often. Denies chest pain, shortness of breath, palpitations, lightheadedness, dizziness, headaches or BLE edema. Currently taking  bidil 20-37.5 mg TID, zaroxolyn2.5 mg prn for BLE edema, entrestro 97-103 mg BID, aldactone 25 mg daily and dmadex 100 mg in the am and 50 mg in pm prn.  BP Readings from Last 3 Encounters:  06/11/20 115/85  02/29/20 109/62  02/28/20 110/68   DM TYPE 2 Needs tighter diabetes control. Average readings 160-180s. Not consistently diet adherent. LDL not at goal. Currently taking atorvastatin 40 mg daily. Denies statin intolerance or myalgias.  Lab Results  Component Value Date   HGBA1C 7.9 (A) 02/29/2020   Lab Results  Component Value Date   LDLCALC 89 02/29/2020      Outpatient Medications Prior to Visit  Medication Sig Dispense Refill  . Accu-Chek FastClix Lancets MISC Use as instructed. Inject into the skin twice daily E11.8 100 each 3  . acetaminophen (TYLENOL) 500 MG tablet Take 1 tablet (500 mg total) by mouth every 6 (six) hours as needed. 30 tablet 0  . aspirin EC 81 MG EC tablet Take 1 tablet (81 mg total) by mouth daily. 30 tablet 0  . atorvastatin (LIPITOR) 40 MG tablet TAKE 1 TABLET (40 MG TOTAL) BY MOUTH DAILY. 90 tablet 0  . Blood Glucose Calibration (ACCU-CHEK GUIDE CONTROL) LIQD 1 each by In Vitro route once as needed for up to 1 dose. 1 each 0  . Blood Pressure Monitor DEVI Please provide patient with insurance approved blood pressure monitor 1 Device 0  . carvedilol (COREG) 12.5 MG tablet TAKE 1 TABLET (12.5 MG TOTAL) BY MOUTH 2 (TWO) TIMES DAILY WITH A MEAL. 180 tablet 3  . dapagliflozin propanediol (FARXIGA) 10 MG TABS tablet Take 10 mg by mouth daily before breakfast. 30 tablet 6  . glucose blood (ACCU-CHEK GUIDE) test strip Use as instructed. Check blood  glucose by fingerstick twice per day. E11.8 100 each 12  . ibuprofen (ADVIL,MOTRIN) 600 MG tablet Take 1 tablet (600 mg total) by mouth every 6 (six) hours as needed. 30 tablet 0  . Insulin Pen Needle (BD PEN NEEDLE NANO U/F) 32G X 4 MM MISC Inject 1 application into the skin at bedtime. The patient is insulin  requiring, ICD 10 code E11.9. The patient injects 1 times per day. 100 each 3  . isosorbide-hydrALAZINE (BIDIL) 20-37.5 MG tablet Take 1 tablet by mouth 3 (three) times daily. 270 tablet 3  . metolazone (ZAROXOLYN) 2.5 MG tablet Take 1 tablet (2.5 mg total) by mouth as directed. 5 tablet 0  . potassium chloride SA (KLOR-CON) 20 MEQ tablet TAKE 2 TABLETS (40 MEQ TOTAL) BY MOUTH 2 (TWO) TIMES DAILY. 120 tablet 11  . sacubitril-valsartan (ENTRESTO) 97-103 MG Take 1 tablet by mouth 2 (two) times daily. 60 tablet 11  . spironolactone (ALDACTONE) 25 MG tablet Take 25 mg by mouth daily.    Marland Kitchen torsemide (DEMADEX) 100 MG tablet Take 1 tablet (100 mg total) by mouth every morning. May also take 0.5 tablets (50 mg total) at bedtime as needed. 45 tablet 6   No facility-administered medications prior to visit.    Allergies  Allergen Reactions  . Bydureon [Exenatide] Rash  . Penicillins Other (See Comments)    Unknown childhood reaction     Review of Systems  Constitutional: Negative for chills, diaphoresis, fatigue, fever and unexpected weight change.  HENT: Negative for trouble swallowing.   Eyes: Negative for visual disturbance.  Respiratory: Negative for cough, chest tightness, shortness of breath and wheezing.   Cardiovascular: Negative for chest pain and palpitations.  Gastrointestinal: Negative for abdominal pain, constipation, diarrhea, nausea and vomiting.  Endocrine: Negative for polydipsia, polyphagia and polyuria.  Genitourinary: Negative for difficulty urinating.  Musculoskeletal: Negative for myalgias.  Skin: Negative for wound.  Neurological: Negative for dizziness, tremors, seizures, syncope, speech difficulty, weakness, light-headedness, numbness and headaches.  Psychiatric/Behavioral: Negative.        Objective:    There were no vitals taken for this visit. Wt Readings from Last 3 Encounters:  06/11/20 (!) 326 lb 6.4 oz (148.1 kg)  02/29/20 (!) 325 lb (147.4 kg)  02/28/20  (!) 322 lb (146.1 kg)    Family History  Problem Relation Age of Onset  . Coronary artery disease Mother 71       s/p PCI  . Lung cancer Father   . Diabetes type II Maternal Uncle   . Coronary artery disease Maternal Uncle   . Stroke Neg Hx   . Heart attack Neg Hx     Social History   Socioeconomic History  . Marital status: Legally Separated    Spouse name: Not on file  . Number of children: 5  . Years of education: 72  . Highest education level: Not on file  Occupational History  . Occupation: [employer]    Comment: part-time  Tobacco Use  . Smoking status: Current Every Day Smoker    Packs/day: 0.25    Years: 8.00    Pack years: 2.00    Types: Cigarettes  . Smokeless tobacco: Never Used  . Tobacco comment: ~3 cigarettes a day  Vaping Use  . Vaping Use: Never used  Substance and Sexual Activity  . Alcohol use: Yes    Alcohol/week: 0.0 standard drinks    Comment: 2x week.   . Drug use: Not Currently    Types: Cocaine  Comment: 08/10/17 - last use one week ago  . Sexual activity: Yes  Other Topics Concern  . Not on file  Social History Narrative   Referred to PCP- appointment made for 09/04/18 at 9:30am at [facility]      Provided with food pantry and free meal list- patient reported sometimes having issues paying for food but reports he does received food stamps and works part time- he does not pay for housing as he lives with his sister.      Patient uses his mother's car and reports no issues getting transport to to medical appointments.   Social Determinants of Health   Financial Resource Strain:   . Difficulty of Paying Living Expenses: Not on file  Food Insecurity:   . Worried About Programme researcher, broadcasting/film/video in the Last Year: Not on file  . Ran Out of Food in the Last Year: Not on file  Transportation Needs:   . Lack of Transportation (Medical): Not on file  . Lack of Transportation (Non-Medical): Not on file  Physical Activity:   . Days of Exercise per  Week: Not on file  . Minutes of Exercise per Session: Not on file  Stress:   . Feeling of Stress : Not on file  Social Connections:   . Frequency of Communication with Friends and Family: Not on file  . Frequency of Social Gatherings with Friends and Family: Not on file  . Attends Religious Services: Not on file  . Active Member of Clubs or Organizations: Not on file  . Attends Banker Meetings: Not on file  . Marital Status: Not on file     Observations/Objective: Awake, alert and oriented x 3    Assessment and Plan: Sanath was seen today for follow-up.  Diagnoses and all orders for this visit:  Essential hypertension Continue all antihypertensives as prescribed.  Remember to bring in your blood pressure log with you for your follow up appointment.  DASH/Mediterranean Diets are healthier choices for HTN.    Mixed hyperlipidemia INSTRUCTIONS: Work on a low fat, heart healthy diet and participate in regular aerobic exercise program by working out at least 150 minutes per week; 5 days a week-30 minutes per day. Avoid red meat/beef/steak,  fried foods. junk foods, sodas, sugary drinks, unhealthy snacking, alcohol and smoking.  Drink at least 80 oz of water per day and monitor your carbohydrate intake daily.    Diabetes mellitus with stage 2 chronic kidney disease (HCC) Continue blood sugar control as discussed in office today, low carbohydrate diet, and regular physical exercise as tolerated, 150 minutes per week (30 min each day, 5 days per week, or 50 min 3 days per week). Keep blood sugar logs with fasting goal of 90-130 mg/dl, post prandial (after you eat) less than 180.  For Hypoglycemia: BS <60 and Hyperglycemia BS >400; contact the clinic ASAP. Annual eye exams and foot exams are recommended.      Follow Up Instructions Return in about 3 months (around 10/14/2020).     I discussed the assessment and treatment plan with the patient. The patient was  provided an opportunity to ask questions and all were answered. The patient agreed with the plan and demonstrated an understanding of the instructions.   The patient was advised to call back or seek an in-person evaluation if the symptoms worsen or if the condition fails to improve as anticipated.  I provided 16 minutes of non-face-to-face time during this encounter including median intraservice time, reviewing  previous notes, labs, imaging, medications and explaining diagnosis and management.  Gildardo Pounds, FNP-BC

## 2020-07-17 ENCOUNTER — Encounter: Payer: Self-pay | Admitting: Nurse Practitioner

## 2020-07-23 ENCOUNTER — Other Ambulatory Visit: Payer: Self-pay | Admitting: Nurse Practitioner

## 2020-07-23 ENCOUNTER — Other Ambulatory Visit: Payer: Medicaid Other

## 2020-07-23 DIAGNOSIS — Z1159 Encounter for screening for other viral diseases: Secondary | ICD-10-CM

## 2020-07-23 DIAGNOSIS — N182 Chronic kidney disease, stage 2 (mild): Secondary | ICD-10-CM

## 2020-08-05 ENCOUNTER — Ambulatory Visit (INDEPENDENT_AMBULATORY_CARE_PROVIDER_SITE_OTHER): Payer: Medicaid Other | Admitting: *Deleted

## 2020-08-05 DIAGNOSIS — I428 Other cardiomyopathies: Secondary | ICD-10-CM | POA: Diagnosis not present

## 2020-08-05 LAB — CUP PACEART REMOTE DEVICE CHECK
Battery Remaining Longevity: 43 mo
Battery Voltage: 2.97 V
Brady Statistic RV Percent Paced: 0.02 %
Date Time Interrogation Session: 20210921120019
HighPow Impedance: 51 Ohm
HighPow Impedance: 66 Ohm
Implantable Lead Implant Date: 20141009
Implantable Lead Location: 753860
Implantable Lead Model: 7121
Implantable Pulse Generator Implant Date: 20141009
Lead Channel Impedance Value: 323 Ohm
Lead Channel Impedance Value: 437 Ohm
Lead Channel Pacing Threshold Amplitude: 0.625 V
Lead Channel Pacing Threshold Pulse Width: 0.4 ms
Lead Channel Sensing Intrinsic Amplitude: 9.5 mV
Lead Channel Sensing Intrinsic Amplitude: 9.5 mV
Lead Channel Setting Pacing Amplitude: 2.5 V
Lead Channel Setting Pacing Pulse Width: 0.4 ms
Lead Channel Setting Sensing Sensitivity: 0.45 mV

## 2020-08-06 MED FILL — SPIRONOLACTONE 25 MG TABLET: 25 | 90 days supply | Qty: 90 | Fill #3

## 2020-08-06 MED FILL — FARXIGA 10 MG TABLET: 10 | 30 days supply | Qty: 30 | Fill #3

## 2020-08-06 MED FILL — POTASSIUM CHLORIDE 20meqER: 20 | 30 days supply | Qty: 120 | Fill #1

## 2020-08-06 MED FILL — TORSEMIDE 100 MG TABLET: 100 | 30 days supply | Qty: 45 | Fill #1

## 2020-08-06 MED FILL — LANTUS SOLOSTAR 100 UNITS/M: 100 | 83 days supply | Qty: 30 | Fill #2

## 2020-08-06 MED FILL — ACCU-CHEK GUIDE TEST STRIP: 34 days supply | Qty: 100 | Fill #2

## 2020-08-06 MED FILL — TECHLITE PEN NDL 32GX5/32: 32G X 4 MM | 30 days supply | Qty: 100 | Fill #3

## 2020-08-06 NOTE — Progress Notes (Signed)
Remote ICD transmission.   

## 2020-08-13 MED FILL — ENTRESTO 97 MG-103 MG TAB: 97-103 | 30 days supply | Qty: 60 | Fill #5

## 2020-08-13 MED FILL — CARVEDILOL 12.5 MG TABLET: 12.5 | 90 days supply | Qty: 180 | Fill #2

## 2020-08-15 MED FILL — BIDIL 20-37.5 MG TABS: 20-37.5 | 90 days supply | Qty: 270 | Fill #1

## 2020-09-08 ENCOUNTER — Other Ambulatory Visit: Payer: Self-pay | Admitting: Nurse Practitioner

## 2020-09-08 DIAGNOSIS — E782 Mixed hyperlipidemia: Secondary | ICD-10-CM

## 2020-09-08 MED FILL — BIDIL 20-37.5 MG TABS: 20-37.5 | 90 days supply | Qty: 270 | Fill #1

## 2020-09-08 MED FILL — ENTRESTO 97 MG-103 MG TAB: 97-103 | 30 days supply | Qty: 60 | Fill #6

## 2020-09-08 MED FILL — FARXIGA 10 MG TABLET: 10 | 30 days supply | Qty: 30 | Fill #4

## 2020-09-08 MED FILL — ATORVASTATIN CALCIUM 40 MG: 40 | 90 days supply | Qty: 90 | Fill #0

## 2020-09-08 MED FILL — POTASSIUM CHLORIDE 20meqER: 20 | 30 days supply | Qty: 120 | Fill #2

## 2020-09-08 MED FILL — TORSEMIDE 100 MG TABLET: 100 | 30 days supply | Qty: 45 | Fill #2

## 2020-09-08 NOTE — Telephone Encounter (Signed)
Requested Prescriptions  Pending Prescriptions Disp Refills  . atorvastatin (LIPITOR) 40 MG tablet [Pharmacy Med Name: ATORVASTATIN CALCIUM 40 MG 40 Tablet] 90 tablet 0    Sig: TAKE 1 TABLET (40 MG TOTAL) BY MOUTH DAILY.     Cardiovascular:  Antilipid - Statins Failed - 09/08/2020 10:06 AM      Failed - LDL in normal range and within 360 days    LDL Chol Calc (NIH)  Date Value Ref Range Status  02/29/2020 89 0 - 99 mg/dL Final         Failed - HDL in normal range and within 360 days    HDL  Date Value Ref Range Status  02/29/2020 36 (L) >39 mg/dL Final         Failed - Triglycerides in normal range and within 360 days    Triglycerides  Date Value Ref Range Status  02/29/2020 194 (H) 0 - 149 mg/dL Final         Passed - Total Cholesterol in normal range and within 360 days    Cholesterol, Total  Date Value Ref Range Status  02/29/2020 158 100 - 199 mg/dL Final         Passed - Patient is not pregnant      Passed - Valid encounter within last 12 months    Recent Outpatient Visits          1 month ago Essential hypertension   [facility] [place], [place] W, NP   6 months ago Diabetes mellitus with stage 2 chronic kidney disease [facility])   [facility] [place], Shea Stakes, NP   11 months ago Essential hypertension   [facility] [facility] [place], [place] W, NP   1 year ago Diabetes mellitus with stage 2 chronic kidney disease [facility])   [facility] [place], Cornelius Moras, RPH-CPP   1 year ago Diabetes mellitus with stage 2 chronic kidney disease [facility])   [facility] Drucilla Chalet, RPH-CPP      Future Appointments            In 1 month Claiborne Rigg, NP [facility]

## 2020-10-14 MED FILL — TORSEMIDE 100 MG TABLET: 100 | 30 days supply | Qty: 45 | Fill #3

## 2020-10-14 MED FILL — ENTRESTO 97 MG-103 MG TAB: 97-103 | 30 days supply | Qty: 60 | Fill #7

## 2020-10-14 MED FILL — POTASSIUM CHLORIDE 20meqER: 20 | 30 days supply | Qty: 120 | Fill #3

## 2020-10-14 MED FILL — FARXIGA 10 MG TABLET: 10 | 30 days supply | Qty: 30 | Fill #5

## 2020-10-22 ENCOUNTER — Ambulatory Visit: Payer: Medicaid Other | Admitting: Nurse Practitioner

## 2020-11-04 ENCOUNTER — Ambulatory Visit (INDEPENDENT_AMBULATORY_CARE_PROVIDER_SITE_OTHER): Payer: Medicaid Other

## 2020-11-04 DIAGNOSIS — I428 Other cardiomyopathies: Secondary | ICD-10-CM | POA: Diagnosis not present

## 2020-11-05 LAB — CUP PACEART REMOTE DEVICE CHECK
Battery Remaining Longevity: 41 mo
Battery Voltage: 2.97 V
Brady Statistic RV Percent Paced: 0.08 %
Date Time Interrogation Session: 20211221211713
HighPow Impedance: 61 Ohm
HighPow Impedance: 83 Ohm
Implantable Lead Implant Date: 20141009
Implantable Lead Location: 753860
Implantable Lead Model: 7121
Implantable Pulse Generator Implant Date: 20141009
Lead Channel Impedance Value: 342 Ohm
Lead Channel Impedance Value: 494 Ohm
Lead Channel Pacing Threshold Amplitude: 0.75 V
Lead Channel Pacing Threshold Pulse Width: 0.4 ms
Lead Channel Sensing Intrinsic Amplitude: 11.375 mV
Lead Channel Sensing Intrinsic Amplitude: 11.375 mV
Lead Channel Setting Pacing Amplitude: 2.5 V
Lead Channel Setting Pacing Pulse Width: 0.4 ms
Lead Channel Setting Sensing Sensitivity: 0.45 mV

## 2020-11-19 ENCOUNTER — Other Ambulatory Visit: Payer: Self-pay | Admitting: Nurse Practitioner

## 2020-11-19 DIAGNOSIS — E782 Mixed hyperlipidemia: Secondary | ICD-10-CM

## 2020-11-19 MED FILL — ENTRESTO 97 MG-103 MG TAB: 97-103 | 30 days supply | Qty: 60 | Fill #8

## 2020-11-19 MED FILL — FARXIGA 10 MG TABLET: 10 | 30 days supply | Qty: 30 | Fill #6

## 2020-11-19 MED FILL — POTASSIUM CHLORIDE 20meqER: 20 | 30 days supply | Qty: 120 | Fill #4

## 2020-11-19 MED FILL — SPIRONOLACTONE 25 MG TABLET: 25 | 30 days supply | Qty: 30 | Fill #4

## 2020-11-19 MED FILL — CARVEDILOL 12.5 MG TABLET: 12.5 | 90 days supply | Qty: 180 | Fill #3

## 2020-11-19 MED FILL — TORSEMIDE 100 MG TABLET: 100 | 30 days supply | Qty: 45 | Fill #4

## 2020-11-19 MED FILL — ACCU-CHEK GUIDE TEST STRIP: 34 days supply | Qty: 100 | Fill #3

## 2020-11-19 MED FILL — LANTUS SOLOSTAR 100 UNITS/M: 100 | 83 days supply | Qty: 30 | Fill #3

## 2020-11-19 NOTE — Progress Notes (Signed)
Remote ICD transmission.   

## 2020-12-12 ENCOUNTER — Other Ambulatory Visit: Payer: Self-pay | Admitting: Nurse Practitioner

## 2020-12-12 ENCOUNTER — Other Ambulatory Visit: Payer: Self-pay

## 2020-12-12 ENCOUNTER — Ambulatory Visit: Payer: Medicaid Other | Attending: Nurse Practitioner | Admitting: Nurse Practitioner

## 2020-12-12 ENCOUNTER — Encounter: Payer: Self-pay | Admitting: Nurse Practitioner

## 2020-12-12 VITALS — BP 93/56 | HR 80 | Ht 76.0 in | Wt 334.0 lb

## 2020-12-12 DIAGNOSIS — Z23 Encounter for immunization: Secondary | ICD-10-CM

## 2020-12-12 DIAGNOSIS — Z1159 Encounter for screening for other viral diseases: Secondary | ICD-10-CM

## 2020-12-12 DIAGNOSIS — N182 Chronic kidney disease, stage 2 (mild): Secondary | ICD-10-CM

## 2020-12-12 DIAGNOSIS — I10 Essential (primary) hypertension: Secondary | ICD-10-CM | POA: Diagnosis not present

## 2020-12-12 DIAGNOSIS — E782 Mixed hyperlipidemia: Secondary | ICD-10-CM

## 2020-12-12 DIAGNOSIS — R7989 Other specified abnormal findings of blood chemistry: Secondary | ICD-10-CM

## 2020-12-12 DIAGNOSIS — E1122 Type 2 diabetes mellitus with diabetic chronic kidney disease: Secondary | ICD-10-CM

## 2020-12-12 LAB — POCT GLYCOSYLATED HEMOGLOBIN (HGB A1C): Hemoglobin A1C: 8.1 % — AB (ref 4.0–5.6)

## 2020-12-12 LAB — GLUCOSE, POCT (MANUAL RESULT ENTRY): POC Glucose: 214 mg/dl — AB (ref 70–99)

## 2020-12-12 MED ORDER — DAPAGLIFLOZIN PROPANEDIOL 10 MG PO TABS: 10.0000 mg | ORAL_TABLET | Freq: Every day | ORAL | 6 refills | Status: DC

## 2020-12-12 MED ORDER — BD PEN NEEDLE NANO U/F 32G X 4 MM MISC: 1.0000 "application " | Freq: Every day | 3 refills | Status: DC

## 2020-12-12 MED ORDER — SACUBITRIL-VALSARTAN 49-51 MG PO TABS
1.0000 | ORAL_TABLET | Freq: Two times a day (BID) | ORAL | 0 refills | Status: DC
Start: 2020-12-12 — End: 2020-12-12

## 2020-12-12 MED ORDER — VICTOZA 18 MG/3ML ~~LOC~~ SOPN: PEN_INJECTOR | SUBCUTANEOUS | 6 refills | Status: DC

## 2020-12-12 NOTE — Progress Notes (Signed)
Assessment & Plan:  [name] was seen today for follow-up.  Diagnoses and all orders for this visit:  Diabetes mellitus with stage 2 chronic kidney disease (HCC) -     Glucose (CBG) -     HgB A1c -     Microalbumin/Creatinine Ratio, Urine -     CMP14+EGFR -     CBC -     dapagliflozin propanediol (FARXIGA) 10 MG TABS tablet; Take 1 tablet (10 mg total) by mouth daily before breakfast. -     liraglutide (VICTOZA) 18 MG/3ML SOPN; SubQ:  Inject 0.6 mg once daily into the skin. Week 2: increase to 1.2 mg once daily; week 3: increase to 1.8 mg once daily -     Insulin Pen Needle (BD PEN NEEDLE NANO U/F) 32G X 4 MM MISC; Inject 1 application into the skin daily. The patient is insulin requiring, ICD 10 code E11.9. The patient injects 1 times per day. Continue blood sugar control as discussed in office today, low carbohydrate diet, and regular physical exercise as tolerated, 150 minutes per week (30 min each day, 5 days per week, or 50 min 3 days per week). Keep blood sugar logs with fasting goal of 90-130 mg/dl, post prandial (after you eat) less than 180.  For Hypoglycemia: BS <60 and Hyperglycemia BS >400; contact the clinic ASAP. Annual eye exams and foot exams are recommended.   Need for hepatitis C screening test -     HCV Ab w Reflex to Quant PCR  Need for immunization against influenza -     Flu Vaccine QUAD 36+ mos IM  Essential hypertension -     sacubitril-valsartan (ENTRESTO) 49-51 MG; Take 1 tablet by mouth 2 (two) times daily. Continue all antihypertensives as prescribed.  Remember to bring in your blood pressure log with you for your follow up appointment.  DASH/Mediterranean Diets are healthier choices for HTN.      Patient has been counseled on age-appropriate routine health concerns for screening and prevention. These are reviewed and up-to-date. Referrals have been placed accordingly. Immunizations are up-to-date or declined.    Subjective:   Chief Complaint   Patient presents with  . Follow-up    Patient is here for 3 months follow up.    [name] 45 y.o. male presents to office today for follow up. 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   He has not had a visit with me since August 2021   Essential Hypertension Blood pressure was checked 3 separate times. Current reading of 93/56 was the highest reading today. He is asymptomatic however I am decreasing his entresto to 49-51 mg BID from 97-103 mg BID. He will continue on BIDIL 20-37.38m BID, carvedilol 12.5 mg BID and spirinolactone 25 mg daily. Denies chest pain, shortness of breath, palpitations, lightheadedness, dizziness, headaches or BLE edema. He continues to smoke cigarettes.  BP Readings from Last 3 Encounters:  12/12/20 (!) 93/56  06/11/20 115/85  02/29/20 109/62   DM 2 Not well controlled. Adding farxiga today. He is currently prescribed. victoza 1.8 mg daily. Unable to tolerate metformin. He has not been monitoring his blood glucose levels daily. Refilled atorvastatin 40 mg daily. LDL not at goal Lab Results  Component Value Date   HGBA1C 8.1 (A) 12/12/2020   Lab Results  Component Value Date   LDLCALC 89 02/29/2020   Review of  Systems  Constitutional: Negative for fever, malaise/fatigue and weight loss.  HENT: Negative.  Negative for nosebleeds.   Eyes: Negative.  Negative for blurred vision, double vision and photophobia.  Respiratory: Negative.  Negative for cough and shortness of breath.   Cardiovascular: Negative.  Negative for chest pain, palpitations and leg swelling.  Gastrointestinal: Negative.  Negative for heartburn, nausea and vomiting.  Musculoskeletal: Negative.  Negative for myalgias.  Neurological: Negative.  Negative for dizziness, focal weakness, seizures and headaches.   Psychiatric/Behavioral: Negative.  Negative for suicidal ideas.    Past Medical History:  Diagnosis Date  . Depression, major, single episode 01/14/2017  . Diabetes mellitus ([place])   . ED (erectile dysfunction)   . History of syncope 01/29/2015  . Hypertension   . 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 Asymptomatic.   Marland Kitchen Nonischemic cardiomyopathy ([place])    a.  echo 4/06: EF 30%, mild to mod MR, mild RAE, inf HK, lat HK , ant HK;    b.  cath 4/06: no CAD, EF 20-25%  . NSVT (nonsustained ventricular tachycardia) ([place])   . Obesity   . Systolic CHF, chronic ([place])    EF 11/17 25-30%, s/p ICD    Past Surgical History:  Procedure Laterality Date  . CARDIAC CATHETERIZATION  09/2011; 02/2013; 04/2013  . CARDIAC DEFIBRILLATOR PLACEMENT  08/23/2013  . IMPLANTABLE CARDIOVERTER DEFIBRILLATOR IMPLANT N/A 08/23/2013   Procedure: IMPLANTABLE CARDIOVERTER DEFIBRILLATOR IMPLANT;  Surgeon: Deboraha Sprang, MD;  Location[facility] C[facility] LAB;  Service: Cardiovascular;  Laterality: N/A;  . LEFT AND RIGHT HEART CATHETERIZATION WITH CORONARY ANGIOGRAM N/A 09/20/2011   Procedure: LEFT AND RIGHT HEART CATHETERIZATION WITH CORONARY ANGIOGRAM;  Surgeon: Jolaine Artist, MD;  Location: [facility] CATH LAB;  Service: Cardiovascular;  Laterality: N/A;  . MULTIPLE EXTRACTIONS WITH ALVEOLOPLASTY N/A 01/26/2013   Procedure:  EXTRACION  TOOTH # 19 WITH ALVEOLOPLASTY;  Surgeon: Lenn Cal, DDS;  Location: [place];  Service: Oral Surgery;  Laterality: N/A;  . RIGHT HEART CATHETERIZATION N/A 02/22/2013   Procedure: RIGHT HEART CATH;  Surgeon: Jolaine Artist, MD;  Location: [facility] CATH LAB;  Service: Cardiovascular;  Laterality: N/A;  . RIGHT HEART CATHETERIZATION N/A 05/03/2013   Procedure: RIGHT HEART CATH;  Surgeon: Jolaine Artist, MD;  Location: [facility] CATH LAB;  Service: Cardiovascular;  Laterality: N/A;    Family History  Problem Relation Age of Onset  . Coronary artery disease Mother 68        s/p PCI  . Lung cancer Father   . Diabetes type II Maternal Uncle   . Coronary artery disease Maternal Uncle   . Stroke Neg Hx   . Heart attack Neg Hx     Social History Reviewed with no changes to be made today.   Outpatient Medications Prior to Visit  Medication Sig Dispense Refill  . Accu-Chek FastClix Lancets MISC Use as instructed. Inject into the skin twice daily E11.8 100 each 3  . acetaminophen (TYLENOL) 500 MG tablet Take 1 tablet (500 mg total) by mouth every 6 (six) hours as needed. 30 tablet 0  . aspirin EC 81 MG EC tablet Take 1 tablet (81 mg total) by mouth daily. 30 tablet 0  . atorvastatin (LIPITOR) 40 MG tablet TAKE 1 TABLET (40 MG TOTAL) BY MOUTH DAILY. 90 tablet 0  . Blood Glucose Calibration (ACCU-CHEK GUIDE CONTROL) LIQD 1 each by In Vitro route once as needed for up to 1 dose. 1 each 0  . Blood Pressure Monitor  DEVI Please provide patient with insurance approved blood pressure monitor 1 Device 0  . carvedilol (COREG) 12.5 MG tablet TAKE 1 TABLET (12.5 MG TOTAL) BY MOUTH 2 (TWO) TIMES DAILY WITH A MEAL. 180 tablet 3  . glucose blood (ACCU-CHEK GUIDE) test strip Use as instructed. Check blood glucose by fingerstick twice per day. E11.8 100 each 12  . ibuprofen (ADVIL,MOTRIN) 600 MG tablet Take 1 tablet (600 mg total) by mouth every 6 (six) hours as needed. 30 tablet 0  . isosorbide-hydrALAZINE (BIDIL) 20-37.5 MG tablet Take 1 tablet by mouth 3 (three) times daily. (Patient taking differently: Take 1 tablet by mouth 2 (two) times daily.) 270 tablet 3  . potassium chloride SA (KLOR-CON) 20 MEQ tablet TAKE 2 TABLETS (40 MEQ TOTAL) BY MOUTH 2 (TWO) TIMES DAILY. 120 tablet 11  . spironolactone (ALDACTONE) 25 MG tablet Take 25 mg by mouth daily.    Marland Kitchen torsemide (DEMADEX) 100 MG tablet Take 1 tablet (100 mg total) by mouth every morning. May also take 0.5 tablets (50 mg total) at bedtime as needed. 45 tablet 6  . dapagliflozin propanediol (FARXIGA) 10 MG TABS tablet Take 10  mg by mouth daily before breakfast. 30 tablet 6  . Insulin Pen Needle (BD PEN NEEDLE NANO U/F) 32G X 4 MM MISC Inject 1 application into the skin at bedtime. The patient is insulin requiring, ICD 10 code E11.9. The patient injects 1 times per day. 100 each 3  . sacubitril-valsartan (ENTRESTO) 97-103 MG Take 1 tablet by mouth 2 (two) times daily. 60 tablet 11  . metolazone (ZAROXOLYN) 2.5 MG tablet Take 1 tablet (2.5 mg total) by mouth as directed. (Patient not taking: Reported on 12/12/2020) 5 tablet 0   No facility-administered medications prior to visit.    Allergies  Allergen Reactions  . Bydureon [Exenatide] Rash  . Penicillins Other (See Comments)    Unknown childhood reaction        Objective:    BP (!) 93/56 (BP Location: Right Arm, Patient Position: Sitting, Cuff Size: Large)   Pulse 80   Ht 6\' 4"  (1.93 m)   Wt (!) 334 lb (151.5 kg)   SpO2 95%   BMI 40.66 kg/m  Wt Readings from Last 3 Encounters:  12/12/20 (!) 334 lb (151.5 kg)  06/11/20 (!) 326 lb 6.4 oz (148.1 kg)  02/29/20 (!) 325 lb (147.4 kg)    Physical Exam Vitals and nursing note reviewed.  Constitutional:      Appearance: He is well-developed and well-nourished.  HENT:     Head: Normocephalic and atraumatic.  Eyes:     Extraocular Movements: EOM normal.  Cardiovascular:     Rate and Rhythm: Normal rate and regular rhythm.     Pulses: Intact distal pulses.     Heart sounds: Normal heart sounds. No murmur heard. No friction rub. No gallop.   Pulmonary:     Effort: Pulmonary effort is normal. No tachypnea or respiratory distress.     Breath sounds: Normal breath sounds. No decreased breath sounds, wheezing, rhonchi or rales.  Chest:     Chest wall: No tenderness.  Abdominal:     General: Bowel sounds are normal.     Palpations: Abdomen is soft.  Musculoskeletal:        General: No edema. Normal range of motion.     Cervical back: Normal range of motion.  Skin:    General: Skin is warm and dry.   Neurological:     Mental Status: He  is alert and oriented to person, place, and time.     Coordination: Coordination normal.  Psychiatric:        Mood and Affect: Mood and affect normal.        Behavior: Behavior normal. Behavior is cooperative.        Thought Content: Thought content normal.        Judgment: Judgment normal.          Patient has been counseled extensively about nutrition and exercise as well as the importance of adherence with medications and regular follow-up. The patient was given clear instructions to go to ER or return to medical center if symptoms don't improve, worsen or new problems develop. The patient verbalized understanding.   Follow-up: Return in about 2 weeks (around 12/26/2020) for BP recheck.   Gildardo Pounds, FNP-BC [facility] [place], [place]   12/17/2020, 10:23 PM

## 2020-12-13 LAB — CMP14+EGFR
ALT: 22 IU/L (ref 0–44)
AST: 18 IU/L (ref 0–40)
Albumin/Globulin Ratio: 1.4 (ref 1.2–2.2)
Albumin: 4.4 g/dL (ref 4.0–5.0)
Alkaline Phosphatase: 100 IU/L (ref 44–121)
BUN/Creatinine Ratio: 16 (ref 9–20)
BUN: 27 mg/dL — ABNORMAL HIGH (ref 6–24)
Bilirubin Total: 0.5 mg/dL (ref 0.0–1.2)
CO2: 24 mmol/L (ref 20–29)
Calcium: 9 mg/dL (ref 8.7–10.2)
Chloride: 96 mmol/L (ref 96–106)
Creatinine, Ser: 1.64 mg/dL — ABNORMAL HIGH (ref 0.76–1.27)
GFR calc Af Amer: 57 mL/min/{1.73_m2} — ABNORMAL LOW (ref >59)
GFR calc non Af Amer: 49 mL/min/{1.73_m2} — ABNORMAL LOW (ref >59)
Globulin, Total: 3.2 g/dL (ref 1.5–4.5)
Glucose: 185 mg/dL — ABNORMAL HIGH (ref 65–99)
Potassium: 4.5 mmol/L (ref 3.5–5.2)
Sodium: 134 mmol/L (ref 134–144)
Total Protein: 7.6 g/dL (ref 6.0–8.5)

## 2020-12-13 LAB — CBC
Hematocrit: 41.9 % (ref 37.5–51.0)
Hemoglobin: 14.7 g/dL (ref 13.0–17.7)
MCH: 29.8 pg (ref 26.6–33.0)
MCHC: 35.1 g/dL (ref 31.5–35.7)
MCV: 85 fL (ref 79–97)
Platelets: 235 x10E3/uL (ref 150–450)
RBC: 4.94 x10E6/uL (ref 4.14–5.80)
RDW: 13 % (ref 11.6–15.4)
WBC: 8.8 x10E3/uL (ref 3.4–10.8)

## 2020-12-13 LAB — MICROALBUMIN / CREATININE URINE RATIO
Creatinine, Urine: 66.6 mg/dL
Microalb/Creat Ratio: 25 mg/g{creat} (ref 0–29)
Microalbumin, Urine: 16.9 ug/mL

## 2020-12-13 LAB — HCV AB W REFLEX TO QUANT PCR: HCV Ab: 0.1 s/co ratio (ref 0.0–0.9)

## 2020-12-13 LAB — HCV INTERPRETATION

## 2020-12-15 MED FILL — BD PEN NDL NANO 32GX5/32: 32G X 4 MM | 25 days supply | Qty: 100 | Fill #0

## 2020-12-15 MED FILL — FARXIGA 10 MG TABLET: 10 | 30 days supply | Qty: 30 | Fill #0

## 2020-12-15 MED FILL — ENTRESTO 49 MG-51 MG TABLET: 49-51 | 30 days supply | Qty: 60 | Fill #0

## 2020-12-17 ENCOUNTER — Other Ambulatory Visit: Payer: Self-pay

## 2020-12-17 ENCOUNTER — Other Ambulatory Visit: Payer: Self-pay | Admitting: Obstetrics and Gynecology

## 2020-12-17 ENCOUNTER — Encounter: Payer: Self-pay | Admitting: Nurse Practitioner

## 2020-12-17 MED FILL — VICTOZA 18 MG/3 ML INJECT P: 18 | 30 days supply | Qty: 9 | Fill #0

## 2020-12-17 NOTE — Patient Outreach (Signed)
Medicaid Managed Care   Nurse Care Manager Note  12/17/2020 Name:  Douglas [name] MRN:  [id number] DOB:  18-Sep-1976  Douglas [name] is an 45 y.o. year old male who is a primary patient of Douglas Rigg, NP.  The [facility] Managed Care Coordination team was consulted for assistance with:    chronic healthcare mangement needs.  Douglas [name] was given information about Medicaid Managed Care Coordination team services today. [name] agreed to services and verbal consent obtained.  Engaged with patient by telephone for initial visit in response to provider referral for case management and/or care coordination services.   Assessments/Interventions:  Review of past medical history, allergies, medications, health status, including review of consultants reports, laboratory and other test data, was performed as part of comprehensive evaluation and provision of chronic care management services.  SDOH (Social Determinants of Health) assessments and interventions performed:   Care Plan  Allergies  Allergen Reactions  . Bydureon [Exenatide] Rash  . Penicillins Other (See Comments)    Unknown childhood reaction     Medications Reviewed Today    Reviewed by Danie Chandler, RN (Registered Nurse) on 12/17/20 at 1418  Med List Status: <None>  Medication Order Taking? Sig Documenting Provider Last Dose Status Informant  Accu-Chek FastClix Lancets MISC [id number] No Use as instructed. Inject into the skin twice daily E11.8 Douglas Rigg, NP Taking Active   acetaminophen (TYLENOL) 500 MG tablet [id number] No Take 1 tablet (500 mg total) by mouth every 6 (six) hours as needed. Emi Holes, PA-C Taking Active   aspirin EC 81 MG EC tablet [id number] No Take 1 tablet (81 mg total) by mouth daily. Lorenda Hatchet, MD Taking Active Self  atorvastatin (LIPITOR) 40 MG tablet [id number] No TAKE 1 TABLET (40 MG TOTAL) BY MOUTH DAILY. Douglas Rigg, NP Taking Active   Blood Glucose  Calibration (ACCU-CHEK GUIDE CONTROL) LIQD [id number] No 1 each by In Vitro route once as needed for up to 1 dose. Douglas Rigg, NP Taking Active   Blood Pressure Monitor DEVI [id number] No Please provide patient with insurance approved blood pressure monitor Douglas Rigg, NP Taking Active   carvedilol (COREG) 12.5 MG tablet [id number] No TAKE 1 TABLET (12.5 MG TOTAL) BY MOUTH 2 (TWO) TIMES DAILY WITH A MEAL. Bensimhon, Bevelyn Buckles, MD Taking Active   dapagliflozin propanediol (FARXIGA) 10 MG TABS tablet [id number]  Take 1 tablet (10 mg total) by mouth daily before breakfast. Douglas Rigg, NP  Active   glucose blood (ACCU-CHEK GUIDE) test strip [id number] No Use as instructed. Check blood glucose by fingerstick twice per day. E11.8 Robbie Lis M, PA-C Taking Active   ibuprofen (ADVIL,MOTRIN) 600 MG tablet [id number] No Take 1 tablet (600 mg total) by mouth every 6 (six) hours as needed. Emi Holes, PA-C Taking Active   Insulin Pen Needle (BD PEN NEEDLE NANO U/F) 32G X 4 MM MISC [id number]  Inject 1 application into the skin daily. The patient is insulin requiring, ICD 10 code E11.9. The patient injects 1 times per day. Douglas Rigg, NP  Active   isosorbide-hydrALAZINE (BIDIL) 20-37.5 MG tablet [id number] No Take 1 tablet by mouth 3 (three) times daily.  Patient taking differently: Take 1 tablet by mouth 2 (two) times daily.   Bensimhon, Bevelyn Buckles, MD Taking Active   liraglutide (VICTOZA) 18 MG/3ML SOPN [id number]  SubQ:  Inject 0.6 mg once daily into the skin. Week 2: increase to 1.2 mg once  daily; week 3: increase to 1.8 mg once daily Douglas Rigg, NP  Active   potassium chloride SA (KLOR-CON) 20 MEQ tablet [id number] No TAKE 2 TABLETS (40 MEQ TOTAL) BY MOUTH 2 (TWO) TIMES DAILY. Bensimhon, Bevelyn Buckles, MD Taking Active   sacubitril-valsartan (ENTRESTO) 49-51 MG [id number]  Take 1 tablet by mouth 2 (two) times daily. Douglas Rigg, NP  Active   spironolactone (ALDACTONE) 25 MG tablet  [id number] No Take 25 mg by mouth daily. [provider] Taking Active   torsemide (DEMADEX) 100 MG tablet [id number] No Take 1 tablet (100 mg total) by mouth every morning. May also take 0.5 tablets (50 mg total) at bedtime as needed. Allayne Butcher, PA-C Taking Active           Patient Active Problem List   Diagnosis Date Noted  . Onychomycosis 01/16/2017  . Depression, major, single episode 01/14/2017  . Allergic rhinitis 04/28/2015  . Chest pain 04/21/2015  . Prolonged Q-T interval on ECG 01/31/2015  . ICD (MDT) in place 01/29/2015  . Obesity 01/29/2015  . Healthcare maintenance 12/11/2014  . Cocaine use 06/23/2012  . Tobacco use 09/20/2011  . VT (ventricular tachycardia) (HCC) 09/19/2011  . ED (erectile dysfunction) 08/02/2011  . Chronic combined systolic and diastolic congestive heart failure (HCC) 06/21/2011  . Diabetes mellitus with stage 2 chronic kidney disease (HCC) 04/28/2009  . Hyperlipidemia 04/28/2009  . Essential hypertension 04/28/2009    Conditions to be addressed/monitored per PCP order:  chronic healthcare mangement needs-DM2. HTN, CHF  Care Plan : General Plan of Care (Adult)  Updates made by Danie Chandler, RN since 12/17/2020 12:00 AM    Problem: Health Promotion or Disease Self-Management (General Plan of Care)   Priority: High  Onset Date: 12/17/2020    Long-Range Goal: Self-Management Plan Developed   Start Date: 12/17/2020  Expected End Date: 03/16/2021  This Visit's Progress: Not on track  Priority: High  Note:   Current Barriers:  . Chronic Disease Management support and education needs.  Nurse Case Manager Clinical Goal(s):  Marland Kitchen Over the next 90 days, patient will attend all scheduled medical appointments:  Interventions:  . Inter-disciplinary care team collaboration (see longitudinal plan of care) . Evaluation of current treatment plan and patient's adherence to plan as established by provider. . Advised patient to schedule follow  up appointment with Dr. [name]. . Reviewed medications with patient. . Discussed plans with patient for ongoing care management follow up and provided patient with direct contact information for care management team  Patient Goals/Self-Care Activities Over the next 90 days, patient will:  -Patient will  Self administers medications as prescribed Attends all scheduled provider appointments Calls pharmacy for medication refills Calls provider office for new concerns or questions  Follow Up Plan: A member of the Managed Medicaid team will reach out to the patient over the next 30 days.  Patient has been provided with contact information for Managed Medicaid team.           Follow Up:  Patient agrees to Care Plan and Follow-up.  Plan: The Managed Medicaid care management team will reach out to the patient again over the next 30 days. and The patient has been provided with contact information for the Managed Medicaid care management team and has been advised to call with any health related questions or concerns.  Date/time of next scheduled RN care management/care coordination outreach:  01/13/21 at 145

## 2020-12-17 NOTE — Patient Instructions (Signed)
Hi Mr. Judice, thank you for speaking with me today.  Mr. Kanady was given information about Medicaid Managed Care team care coordination services as a part of their [facility] Community Plan Medicaid benefit. [name] verbally consented to engagement with the [facility] Managed Care team.   For questions related to your [facility], please call: [phone] or visit the homepage here: [email or web address]  If you would like to schedule transportation through your [facility], please call the following number at least 2 days in advance of your appointment: [phone]  Mr. Borum - following are the goals we discussed in your visit today:  Goals Addressed            This Visit's Progress   . Protect My Health       Timeframe:  Long-Range Goal Priority:  High Start Date:       12/17/20                      Expected End Date:      03/16/21                 Follow Up Date 01/14/21   - schedule recommended health tests  - schedule and keep appointment for annual check-up        Patient verbalizes understanding of instructions provided today.   The Managed Medicaid care management team will reach out to the patient again over the next 30 days.  The patient has been provided with contact information for the Managed Medicaid care management team and has been advised to call with any health related questions or concerns.   Kathi Der RN, BSN [place]  [facility] Care Management Coordinator - Managed Medicaid High Risk [phone]  Following is a copy of your plan of care:  Patient Care Plan: General Plan of Care (Adult)    Problem Identified: Health Promotion or Disease Self-Management (General Plan of Care)   Priority: High  Onset Date: 12/17/2020    Long-Range Goal: Self-Management Plan Developed   Start Date: 12/17/2020  Expected End Date: 03/16/2021   This Visit's Progress: Not on track  Priority: High  Note:   Current Barriers:  . Chronic Disease Management support and education needs.  Nurse Case Manager Clinical Goal(s):  Marland Kitchen Over the next 90 days, patient will attend all scheduled medical appointments:  Interventions:  . Inter-disciplinary care team collaboration (see longitudinal plan of care) . Evaluation of current treatment plan and patient's adherence to plan as established by provider. . Advised patient to schedule follow up appointment with Dr. [name]. . Reviewed medications with patient. . Discussed plans with patient for ongoing care management follow up and provided patient with direct contact information for care management team  Patient Goals/Self-Care Activities Over the next 90 days, patient will:  -Patient will  Self administers medications as prescribed Attends all scheduled provider appointments Calls pharmacy for medication refills Calls provider office for new concerns or questions  Follow Up Plan: A member of the Managed Medicaid team will reach out to the patient over the next 30 days.  Patient has been provided with contact information for Managed Medicaid team.

## 2020-12-19 ENCOUNTER — Other Ambulatory Visit (HOSPITAL_COMMUNITY): Payer: Self-pay | Admitting: Cardiology

## 2020-12-19 ENCOUNTER — Other Ambulatory Visit (HOSPITAL_COMMUNITY): Payer: Self-pay | Admitting: Internal Medicine

## 2020-12-19 MED FILL — BIDIL 20-37.5 MG TABS: 20-37.5 | 90 days supply | Qty: 270 | Fill #2

## 2020-12-19 MED FILL — POTASSIUM CHLORIDE 20meqER: 20 | 30 days supply | Qty: 120 | Fill #5

## 2020-12-19 MED FILL — TORSEMIDE 100 MG TABLET: 100 | 30 days supply | Qty: 45 | Fill #5

## 2020-12-19 MED FILL — SPIRONOLACTONE 25 MG TABLET: 25 | 30 days supply | Qty: 30 | Fill #0

## 2021-01-13 ENCOUNTER — Other Ambulatory Visit: Payer: Self-pay | Admitting: Obstetrics and Gynecology

## 2021-01-13 NOTE — Patient Outreach (Signed)
Care Coordination  01/13/2021  KAIYDEN SIMKIN [birth year] [id number]    Medicaid Managed Care   Unsuccessful Outreach Note  01/13/2021 Name: HIMMAT ENBERG MRN: [id number] DOB: 1976/02/18  Referred by: Claiborne Rigg, NP Reason for referral : High Risk Managed Medicaid (Unsuccessful telephone outreach)   An unsuccessful telephone outreach was attempted today. The patient was referred to the case management team for assistance with care management and care coordination.   Follow Up Plan: A member of the Managed Medicaid  care management team will reach out to the patient again over the next 7 days.   Kathi Der RN, BSN [place]  [facility] [employer] - Managed Medicaid High Risk [phone].

## 2021-01-13 NOTE — Patient Instructions (Signed)
Hi Mr. Frix, I am sorry we missed you today  - as a part of your Medicaid benefit, you are eligible for care management and care coordination services at no cost or copay. I was unable to reach you by phone today but would be happy to help you with your health related needs. Please feel free to call me at [phone].  A member of the Managed Medicaid care management team will reach out to you again over the next 7 days.   Kathi Der RN, BSN [place]  [facility] [employer] - Managed Medicaid High Risk [phone].

## 2021-01-16 NOTE — Progress Notes (Signed)
Error

## 2021-01-19 ENCOUNTER — Other Ambulatory Visit: Payer: Self-pay

## 2021-01-19 ENCOUNTER — Other Ambulatory Visit (HOSPITAL_COMMUNITY): Payer: Self-pay | Admitting: Family Medicine

## 2021-01-19 ENCOUNTER — Ambulatory Visit (HOSPITAL_COMMUNITY)
Admission: RE | Admit: 2021-01-19 | Discharge: 2021-01-19 | Disposition: A | Discharge status: Home / Self Care | Payer: Medicaid Other | Source: Ambulatory Visit | Attending: Adult Health | Admitting: Adult Health

## 2021-01-19 VITALS — BP 88/60 | HR 88 | Wt 326.8 lb

## 2021-01-19 DIAGNOSIS — Z720 Tobacco use: Secondary | ICD-10-CM

## 2021-01-19 DIAGNOSIS — E119 Type 2 diabetes mellitus without complications: Secondary | ICD-10-CM | POA: Diagnosis not present

## 2021-01-19 DIAGNOSIS — Z794 Long term (current) use of insulin: Secondary | ICD-10-CM | POA: Diagnosis not present

## 2021-01-19 DIAGNOSIS — Z9119 Patient's noncompliance with other medical treatment and regimen: Secondary | ICD-10-CM | POA: Insufficient documentation

## 2021-01-19 DIAGNOSIS — I110 Hypertensive heart disease with heart failure: Secondary | ICD-10-CM | POA: Diagnosis not present

## 2021-01-19 DIAGNOSIS — F1410 Cocaine abuse, uncomplicated: Secondary | ICD-10-CM | POA: Diagnosis not present

## 2021-01-19 DIAGNOSIS — E1122 Type 2 diabetes mellitus with diabetic chronic kidney disease: Secondary | ICD-10-CM

## 2021-01-19 DIAGNOSIS — Z7982 Long term (current) use of aspirin: Secondary | ICD-10-CM | POA: Diagnosis not present

## 2021-01-19 DIAGNOSIS — Z6839 Body mass index (BMI) 39.0-39.9, adult: Secondary | ICD-10-CM | POA: Diagnosis not present

## 2021-01-19 DIAGNOSIS — I5042 Chronic combined systolic (congestive) and diastolic (congestive) heart failure: Secondary | ICD-10-CM | POA: Diagnosis not present

## 2021-01-19 DIAGNOSIS — I428 Other cardiomyopathies: Secondary | ICD-10-CM | POA: Diagnosis not present

## 2021-01-19 DIAGNOSIS — F1490 Cocaine use, unspecified, uncomplicated: Secondary | ICD-10-CM | POA: Diagnosis not present

## 2021-01-19 DIAGNOSIS — Z79899 Other long term (current) drug therapy: Secondary | ICD-10-CM | POA: Diagnosis not present

## 2021-01-19 DIAGNOSIS — F17210 Nicotine dependence, cigarettes, uncomplicated: Secondary | ICD-10-CM | POA: Insufficient documentation

## 2021-01-19 DIAGNOSIS — Z95810 Presence of automatic (implantable) cardiac defibrillator: Secondary | ICD-10-CM | POA: Insufficient documentation

## 2021-01-19 DIAGNOSIS — Z8249 Family history of ischemic heart disease and other diseases of the circulatory system: Secondary | ICD-10-CM | POA: Diagnosis not present

## 2021-01-19 DIAGNOSIS — E669 Obesity, unspecified: Secondary | ICD-10-CM | POA: Diagnosis not present

## 2021-01-19 DIAGNOSIS — I10 Essential (primary) hypertension: Secondary | ICD-10-CM

## 2021-01-19 DIAGNOSIS — N182 Chronic kidney disease, stage 2 (mild): Secondary | ICD-10-CM

## 2021-01-19 DIAGNOSIS — Z8616 Personal history of COVID-19: Secondary | ICD-10-CM | POA: Diagnosis not present

## 2021-01-19 DIAGNOSIS — I5022 Chronic systolic (congestive) heart failure: Secondary | ICD-10-CM | POA: Diagnosis present

## 2021-01-19 LAB — BASIC METABOLIC PANEL
Anion gap: 10 (ref 5–15)
BUN: 22 mg/dL — ABNORMAL HIGH (ref 6–20)
CO2: 26 mmol/L (ref 22–32)
Calcium: 9.4 mg/dL (ref 8.9–10.3)
Chloride: 102 mmol/L (ref 98–111)
Creatinine, Ser: 1.29 mg/dL — ABNORMAL HIGH (ref 0.61–1.24)
GFR, Estimated: >60 mL/min (ref >60)
Glucose, Bld: 157 mg/dL — ABNORMAL HIGH (ref 70–99)
Potassium: 4.6 mmol/L (ref 3.5–5.1)
Sodium: 138 mmol/L (ref 135–145)

## 2021-01-19 MED ORDER — SPIRONOLACTONE 25 MG PO TABS: 25.0000 mg | ORAL_TABLET | Freq: Every day | ORAL | 11 refills | Status: DC

## 2021-01-19 MED ORDER — ENTRESTO 49-51 MG PO TABS: 1.0000 | ORAL_TABLET | Freq: Two times a day (BID) | ORAL | 11 refills | Status: DC

## 2021-01-19 MED ORDER — DAPAGLIFLOZIN PROPANEDIOL 10 MG PO TABS: 10.0000 mg | ORAL_TABLET | Freq: Every day | ORAL | 3 refills | Status: DC

## 2021-01-19 MED ORDER — TORSEMIDE 100 MG PO TABS: ORAL_TABLET | ORAL | 3 refills | Status: DC

## 2021-01-19 MED FILL — FARXIGA 10 MG TABLET: 10 | 30 days supply | Qty: 30 | Fill #0

## 2021-01-19 MED FILL — SPIRONOLACTONE 25 MG TABLET: 25 | 30 days supply | Qty: 30 | Fill #0

## 2021-01-19 MED FILL — TORSEMIDE 100 MG TABLET: 100 | 60 days supply | Qty: 90 | Fill #0

## 2021-01-19 MED FILL — ENTRESTO 49 MG-51 MG TABLET: 49-51 | 30 days supply | Qty: 60 | Fill #0

## 2021-01-19 NOTE — Patient Instructions (Addendum)
Labs done today. We will contact you only if your labs are abnormal.  DO NOT TAKE YOUR TORSEMIDE EVENING DOSE UNTIL Friday.   No other medication changes were made. Please continue all current medications as prescribed. All of your heart failure medications have been refilled.   Your physician recommends that you schedule a follow-up appointment in: 3 months for an appointment with Dr. [name] with an echo prior to your exam  Your physician has requested that you have an echocardiogram. Echocardiography is a painless test that uses sound waves to create images of your heart. It provides your doctor with information about the size and shape of your heart and how well your heart's chambers and valves are working. This procedure takes approximately one hour. There are no restrictions for this procedure.   If you have any questions or concerns before your next appointment please send [place] a message through [place] or call our office at [phone].    TO LEAVE A MESSAGE FOR THE NURSE SELECT OPTION 2, PLEASE LEAVE A MESSAGE INCLUDING: . YOUR NAME . DATE OF BIRTH . CALL BACK NUMBER . REASON FOR CALL**this is important as we prioritize the call backs  YOU WILL RECEIVE A CALL BACK THE SAME DAY AS LONG AS YOU CALL BEFORE 4:00 PM   Do the following things EVERYDAY: 1) Weigh yourself in the morning before breakfast. Write it down and keep it in a log. 2) Take your medicines as prescribed 3) Eat low salt foods--Limit salt (sodium) to 2000 mg per day.  4) Stay as active as you can everyday 5) Limit all fluids for the day to less than 2 liters   At the [facility], you and your health needs are our priority. As part of our continuing mission to provide you with exceptional heart care, we have created designated Provider Care Teams. These Care Teams include your primary Cardiologist (physician) and Advanced Practice Providers (APPs- Physician Assistants and Nurse Practitioners) who  all work together to provide you with the care you need, when you need it.   You may see any of the following providers on your designated Care Team at your next follow up: Marland Kitchen Dr [name] . Dr Marca Ancona . Tonye Becket, NP . Robbie Lis, PA . Karle Plumber, PharmD   Please be sure to bring in all your medications bottles to every appointment.

## 2021-01-19 NOTE — Progress Notes (Signed)
ADVANCED HF CLINIC NOTE  Patient ID: Douglas [name], male   DOB: 03-31-1976, 45 y.o.   MRN: [id number]  EP: Dr Graciela Husbands Primary Cardiologist: Dr. [name] PCP: Douglas Denver NP  History of Present Illness: Douglas [name] is a 45 y.o. male with a h/o HTN, HL, obesity, smoker, cocaine abuse,  NICM and chronic systolic heart failure. He is S/P Medtronic ICD due to NICM. Management has been complicated by severe non-compliance.   Underwent cath in 2006 which showed normal cors with EF 20-25%.   EF had recovered in late 2013 but has again fallen to 15% per echo 01/20/13.  He was admitted to [facility] 02/21/13 from clinic for low output symptoms and volume overload.  He required IV lasix and metolazone.  He diuresed 19 pounds with discharge weight of 254 pounds.  He had runs of VT therefore he was started on amiodarone per EP and Lifevest was placed at discharge.  He underwent RHC on 02/22/13.  He has not required metolazone.   He was admitted in 1/16 with VT and ICD shock x 2.   Echo 6/20 -->EF 25-30%   He was COVID + 11/14/20. He quarantined and recovered.   Last month he saw PCP and entresto was cut back tot 49-51 mg twice a day due to low bp.   Today he returns for HF follow up.Overall feeling fine. Rarely short of breath. Denies SOB/PND/Orthopnea. Appetite ok. No fever or chills.  He has not been weighing at home. Taking all medications. Working full time at [employer] at [employer]. Uses cocaine every now and then. Smokes 4-5 cigarettes.   ICD interrogation: Fluid index below threshold. Activity ~ 4 hours. No VT/A fib   RHC 02/22/13  RA = 12  RV = 49/6/15  PA = 54/29 (41)  PCW = 28 (v= 40)  Fick cardiac output/index = 4.3/1.7  PVR = 2.6 [place]  SVR = 1580 dynes  FA sat = 98%  PA sat = 53%, 54%  Non-invasive BP = 124/86 (97)  RHC 05/03/13 RA = 2  RV = 29/2/2  PA = 26/10 (17)  PCW = 3  Fick cardiac output/index = 6.1/2.3  PVR = 2.3  FA sat = 93%  PA sat = 65%, 67%  ECHO  06/25/13 EF 20-25% ECHO 05/01/14 EF 35-40%, moderately dilated LV, mild LVH, diffuse hypokinesis Echo 6/16 EF 25-30%, mild LVH, moderately dilated LV.  ECHO 09/23/2016 EF 25-30%. ECHO 08/19/2017 EF 40-45% grade IDD  August 2012 CPX VO2 20 SH: lives with his sister. Rare ETOH, smokes rarely. Remote cocaine. FH: Mom has CAD.    Review of systems complete and found to be negative unless listed in HPI.   Past Medical History:  Diagnosis Date  . Depression, major, single episode 01/14/2017  . Diabetes mellitus (HCC)   . ED (erectile dysfunction)   . History of syncope 01/29/2015  . Hypertension   . 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 Asymptomatic.   Marland Kitchen Nonischemic cardiomyopathy (HCC)    a.  echo 4/06: EF 30%, mild to mod MR, mild RAE, inf HK, lat HK , ant HK;    b.  cath 4/06: no CAD, EF 20-25%  . NSVT (nonsustained ventricular tachycardia) (HCC)   . Obesity   . Systolic CHF, chronic (HCC)    EF 11/17 25-30%, s/p ICD    Current Outpatient Medications  Medication Sig Dispense Refill  . Accu-Chek FastClix Lancets MISC Use as instructed. Inject  into the skin twice daily E11.8 100 each 3  . acetaminophen (TYLENOL) 500 MG tablet Take 1 tablet (500 mg total) by mouth every 6 (six) hours as needed. 30 tablet 0  . aspirin EC 81 MG EC tablet Take 1 tablet (81 mg total) by mouth daily. 30 tablet 0  . atorvastatin (LIPITOR) 40 MG tablet TAKE 1 TABLET (40 MG TOTAL) BY MOUTH DAILY. 90 tablet 0  . Blood Glucose Calibration (ACCU-CHEK GUIDE CONTROL) LIQD 1 each by In Vitro route once as needed for up to 1 dose. 1 each 0  . Blood Pressure Monitor DEVI Please provide patient with insurance approved blood pressure monitor 1 Device 0  . carvedilol (COREG) 12.5 MG tablet TAKE 1 TABLET (12.5 MG TOTAL) BY MOUTH 2 (TWO) TIMES DAILY WITH A MEAL. 180 tablet 3  . dapagliflozin propanediol (FARXIGA) 10 MG TABS tablet Take 1 tablet (10 mg total) by mouth daily before  breakfast. 30 tablet 6  . glucose blood (ACCU-CHEK GUIDE) test strip Use as instructed. Check blood glucose by fingerstick twice per day. E11.8 100 each 12  . ibuprofen (ADVIL,MOTRIN) 600 MG tablet Take 1 tablet (600 mg total) by mouth every 6 (six) hours as needed. 30 tablet 0  . Insulin Pen Needle (BD PEN NEEDLE NANO U/F) 32G X 4 MM MISC Inject 1 application into the skin daily. The patient is insulin requiring, ICD 10 code E11.9. The patient injects 1 times per day. 100 each 3  . isosorbide-hydrALAZINE (BIDIL) 20-37.5 MG tablet Take 1 tablet by mouth 3 (three) times daily. (Patient taking differently: Take 1 tablet by mouth 2 (two) times daily.) 270 tablet 3  . potassium chloride SA (KLOR-CON) 20 MEQ tablet TAKE 2 TABLETS (40 MEQ TOTAL) BY MOUTH 2 (TWO) TIMES DAILY. 120 tablet 11  . sacubitril-valsartan (ENTRESTO) 49-51 MG Take 1 tablet by mouth 2 (two) times daily.    Marland Kitchen spironolactone (ALDACTONE) 25 MG tablet TAKE 1 TABLET (25 MG TOTAL) BY MOUTH DAILY. 30 tablet 2  . torsemide (DEMADEX) 100 MG tablet Take 1 tablet (100 mg total) by mouth every morning. May also take 0.5 tablets (50 mg total) at bedtime as needed. 45 tablet 6   No current facility-administered medications for this encounter.    Allergies  Allergen Reactions  . Bydureon [Exenatide] Rash  . Penicillins Other (See Comments)    Unknown childhood reaction     Vitals:   01/19/21 1057  BP: (!) 88/60  Pulse: 88  SpO2: 96%  Weight: (!) 148.2 kg (326 lb 12.8 oz)   Wt Readings from Last 3 Encounters:  01/19/21 (!) 148.2 kg (326 lb 12.8 oz)  12/12/20 (!) 151.5 kg (334 lb)  06/11/20 (!) 148.1 kg (326 lb 6.4 oz)   PHYSICAL EXAM: General:  Well appearing. No resp difficulty HEENT: normal Neck: supple. no JVD. Carotids 2+ bilat; no bruits. No lymphadenopathy or thryomegaly appreciated. Cor: PMI nondisplaced. Regular rate & rhythm. No rubs, gallops or murmurs. Lungs: clear Abdomen: soft, nontender, nondistended. No  hepatosplenomegaly. No bruits or masses. Good bowel sounds. Extremities: no cyanosis, clubbing, rash, edema Neuro: alert & orientedx3, cranial nerves grossly intact. moves all 4 extremities w/o difficulty. Affect pleasant   1) Chronic systolic HF: Nonischemic cardiomyopathy. S/p Medtronic ICD  - Echo 09/2016 with EF 25-30%, RV systolic function mildly reduced - ECHO 08/19/2017 EF 40-45%. - Echo 6/20 EF 25-30% - Optivol- Activity ~ 3 hours No Vt. No A fib. Fluid index low.  -NYHA II. Volume status stable.  -  Continue torsemide 100 mg daily. Needs to hold evening torsemide until Friday.   - Continue carvediol 12.5 mg twice a day.  - Continue Bidil 1 tab BID (says he gets a HA if he takes more) - Continue entresto 49-51 mg twice a day.    -Continue Farxiga 10 mg daily  - Continue spiro 25 mg daily. - Repeat echo at next visit - Refill entresto, spiro, farxiga, and torsemide.  - Check BMET   2) HTN:  -  Low today but asymptomatic. Continue current regimen.   3) VT:  - No episodes > 1 year.   4) Cocaine abuse:   - Discussed cessation.    5) Tobacco Abuse - Discussed cessation.   6) DMII- on insulin. - last hgb A1c 7.5 - Continue Farxiga 10 mg daily.   7. Obesity Body mass index is 39.78 kg/m.  Follow up in 3 months with Dr [name] and an ECHO. Check BMET today.  Greater than 50% of the (total minutes 25) visit spent in counseling/coordination of care regarding the above.   Tonye Becket, NP  01/19/2021

## 2021-01-21 MED FILL — POTASSIUM CHLORIDE 20meqER: 20 | 30 days supply | Qty: 120 | Fill #6

## 2021-02-03 ENCOUNTER — Ambulatory Visit (INDEPENDENT_AMBULATORY_CARE_PROVIDER_SITE_OTHER): Payer: Medicaid Other

## 2021-02-03 DIAGNOSIS — I428 Other cardiomyopathies: Secondary | ICD-10-CM

## 2021-02-05 LAB — CUP PACEART REMOTE DEVICE CHECK
Battery Remaining Longevity: 41 mo
Battery Voltage: 2.96 V
Brady Statistic RV Percent Paced: 0.04 %
Date Time Interrogation Session: 20220324113554
HighPow Impedance: 51 Ohm
HighPow Impedance: 65 Ohm
Implantable Lead Implant Date: 20141009
Implantable Lead Location: 753860
Implantable Lead Model: 7121
Implantable Pulse Generator Implant Date: 20141009
Lead Channel Impedance Value: 323 Ohm
Lead Channel Impedance Value: 437 Ohm
Lead Channel Pacing Threshold Amplitude: 0.75 V
Lead Channel Pacing Threshold Pulse Width: 0.4 ms
Lead Channel Sensing Intrinsic Amplitude: 10.125 mV
Lead Channel Sensing Intrinsic Amplitude: 10.125 mV
Lead Channel Setting Pacing Amplitude: 2.5 V
Lead Channel Setting Pacing Pulse Width: 0.4 ms
Lead Channel Setting Sensing Sensitivity: 0.45 mV

## 2021-02-11 NOTE — Progress Notes (Signed)
Remote ICD transmission.   

## 2021-02-14 ENCOUNTER — Other Ambulatory Visit: Payer: Self-pay

## 2021-02-23 ENCOUNTER — Other Ambulatory Visit: Payer: Self-pay

## 2021-02-23 ENCOUNTER — Other Ambulatory Visit: Payer: Self-pay | Admitting: Nurse Practitioner

## 2021-02-23 ENCOUNTER — Other Ambulatory Visit (HOSPITAL_COMMUNITY): Payer: Self-pay | Admitting: Internal Medicine

## 2021-02-23 DIAGNOSIS — I10 Essential (primary) hypertension: Secondary | ICD-10-CM

## 2021-02-23 MED FILL — Potassium Chloride Microencapsulated Crys ER Tab 20 mEq: ORAL | 30 days supply | Qty: 120 | Fill #0 | Status: AC

## 2021-02-23 MED FILL — Dapagliflozin Propanediol Tab 10 MG (Base Equivalent): ORAL | 30 days supply | Qty: 30 | Fill #0 | Status: AC

## 2021-02-23 MED FILL — Insulin Pen Needle 32 G X 4 MM (1/6" or 5/32"): 90 days supply | Qty: 100 | Fill #0 | Status: CN

## 2021-02-23 MED FILL — Insulin Glargine Soln Pen-Injector 100 Unit/ML: SUBCUTANEOUS | 25 days supply | Qty: 9 | Fill #0 | Status: AC

## 2021-02-23 MED FILL — Insulin Pen Needle 32 G X 4 MM (1/6" or 5/32"): 25 days supply | Qty: 100 | Fill #0 | Status: CN

## 2021-02-23 MED FILL — Spironolactone Tab 25 MG: ORAL | 30 days supply | Qty: 30 | Fill #0 | Status: AC

## 2021-02-23 NOTE — Telephone Encounter (Signed)
Requested medication (s) are due for refill today:   Yes  Requested medication (s) are on the active medication list:   Yes  Future visit scheduled:   No   Last ordered: 12/12/2020 #60, 0 refills  Returned because no protocol assigned to this medication   Requested Prescriptions  Pending Prescriptions Disp Refills   sacubitril-valsartan (ENTRESTO) 49-51 MG 60 tablet 0    Sig: TAKE 1 TABLET BY MOUTH 2 (TWO) TIMES DAILY.      Off-Protocol Failed - 02/23/2021 10:41 AM      Failed - Medication not assigned to a protocol, review manually.      Passed - Valid encounter within last 12 months    Recent Outpatient Visits           2 months ago Diabetes mellitus with stage 2 chronic kidney disease [facility])   [place] [facility] [place], Shea Stakes, NP   7 months ago Essential hypertension   [place] [facility] [place], [place] W, NP   12 months ago Diabetes mellitus with stage 2 chronic kidney disease [facility])   [place] [facility] Claiborne Rigg, NP   1 year ago Essential hypertension   [place] [facility] [place], Shea Stakes, NP   1 year ago Diabetes mellitus with stage 2 chronic kidney disease [facility])   [facility] Drucilla Chalet, RPH-CPP

## 2021-02-24 ENCOUNTER — Other Ambulatory Visit: Payer: Self-pay

## 2021-02-24 MED FILL — Insulin Pen Needle 32 G X 4 MM (1/6" or 5/32"): 25 days supply | Qty: 100 | Fill #0 | Status: AC

## 2021-02-26 ENCOUNTER — Other Ambulatory Visit: Payer: Self-pay

## 2021-02-26 ENCOUNTER — Other Ambulatory Visit (HOSPITAL_COMMUNITY): Payer: Self-pay | Admitting: Internal Medicine

## 2021-02-26 DIAGNOSIS — N182 Chronic kidney disease, stage 2 (mild): Secondary | ICD-10-CM

## 2021-02-26 DIAGNOSIS — E1122 Type 2 diabetes mellitus with diabetic chronic kidney disease: Secondary | ICD-10-CM

## 2021-02-26 MED ORDER — CARVEDILOL 12.5 MG PO TABS
ORAL_TABLET | Freq: Two times a day (BID) | ORAL | 3 refills | Status: DC
  Filled 2021-02-26: qty 60, 30d supply, fill #0
  Filled 2021-03-29: qty 60, 30d supply, fill #1
  Filled 2021-04-30: qty 60, 30d supply, fill #2

## 2021-02-26 MED ORDER — ENTRESTO 49-51 MG PO TABS
1.0000 | ORAL_TABLET | Freq: Two times a day (BID) | ORAL | 11 refills | Status: DC
  Filled 2021-02-26: qty 60, 30d supply, fill #0
  Filled 2021-03-29 – 2021-04-28 (×4): qty 60, 30d supply, fill #1
  Filled 2021-06-09: qty 60, 30d supply, fill #2
  Filled 2021-07-08 – 2021-07-22 (×3): qty 60, 30d supply, fill #3
  Filled 2021-08-24: qty 60, 30d supply, fill #4
  Filled 2021-10-05: qty 60, 30d supply, fill #5
  Filled 2021-11-17: qty 60, 30d supply, fill #6
  Filled 2022-01-08: qty 60, 30d supply, fill #0
  Filled 2022-02-10: qty 60, 30d supply, fill #1

## 2021-02-26 MED ORDER — DAPAGLIFLOZIN PROPANEDIOL 10 MG PO TABS
ORAL_TABLET | Freq: Every day | ORAL | 3 refills | Status: DC
  Filled 2021-03-29: qty 34, 34d supply, fill #0
  Filled 2021-03-30: qty 30, 30d supply, fill #0
  Filled 2021-04-30: qty 30, 30d supply, fill #1

## 2021-03-11 ENCOUNTER — Other Ambulatory Visit: Payer: Self-pay

## 2021-03-29 ENCOUNTER — Other Ambulatory Visit: Payer: Self-pay | Admitting: Nurse Practitioner

## 2021-03-29 DIAGNOSIS — E782 Mixed hyperlipidemia: Secondary | ICD-10-CM

## 2021-03-29 MED FILL — Torsemide Tab 100 MG: ORAL | 34 days supply | Qty: 51 | Fill #0 | Status: AC

## 2021-03-29 MED FILL — Spironolactone Tab 25 MG: ORAL | 30 days supply | Qty: 30 | Fill #1 | Status: AC

## 2021-03-29 MED FILL — Potassium Chloride Microencapsulated Crys ER Tab 20 mEq: ORAL | 30 days supply | Qty: 120 | Fill #1 | Status: AC

## 2021-03-29 NOTE — Telephone Encounter (Signed)
Message sent to pt via MyChart to call [facility] and make appt for f/u and blood work  Last RF 11/19/20 #90. Overdue lab work (last lipid panel 02/19/20. Active med list.

## 2021-03-30 ENCOUNTER — Other Ambulatory Visit: Payer: Self-pay

## 2021-03-30 MED ORDER — ATORVASTATIN CALCIUM 40 MG PO TABS
ORAL_TABLET | Freq: Every day | ORAL | 0 refills | Status: DC
  Filled 2021-03-30: qty 30, 30d supply, fill #0

## 2021-03-31 ENCOUNTER — Other Ambulatory Visit: Payer: Self-pay

## 2021-04-03 ENCOUNTER — Other Ambulatory Visit: Payer: Self-pay | Admitting: Nurse Practitioner

## 2021-04-03 ENCOUNTER — Other Ambulatory Visit: Payer: Self-pay | Admitting: Pharmacist

## 2021-04-03 ENCOUNTER — Other Ambulatory Visit: Payer: Self-pay

## 2021-04-03 DIAGNOSIS — E1122 Type 2 diabetes mellitus with diabetic chronic kidney disease: Secondary | ICD-10-CM

## 2021-04-03 MED ORDER — ACCU-CHEK GUIDE VI STRP
ORAL_STRIP | 2 refills | Status: DC
  Filled 2021-04-03: qty 100, 50d supply, fill #0
  Filled 2021-06-24: qty 100, 50d supply, fill #1
  Filled 2021-09-16: qty 100, 50d supply, fill #2

## 2021-04-03 MED ORDER — ACCU-CHEK SOFTCLIX LANCETS MISC
2 refills | Status: DC
  Filled 2021-04-03: qty 100, 50d supply, fill #0
  Filled 2021-09-16: qty 100, 50d supply, fill #1

## 2021-04-03 MED ORDER — ACCU-CHEK GUIDE ME W/DEVICE KIT
PACK | 0 refills | Status: DC
  Filled 2021-04-03: qty 1, 1d supply, fill #0

## 2021-04-03 NOTE — Telephone Encounter (Signed)
Requested medication (s) are due for refill today: Yes  Requested medication (s) are on the active medication list: Yes  Last refill:  02/29/20  Future visit scheduled: No  Notes to clinic:  Prescription has expired.    Requested Prescriptions  Pending Prescriptions Disp Refills   insulin glargine (LANTUS SOLOSTAR) 100 UNIT/ML Solostar Pen 30 mL 6    Sig: INJECT 36 UNITS INTO THE SKIN DAILY AT 10 PM.      Endocrinology:  Diabetes - Insulins Failed - 04/03/2021  9:42 AM      Failed - HBA1C is between 0 and 7.9 and within 180 days    Hemoglobin A1C  Date Value Ref Range Status  12/12/2020 8.1 (A) 4.0 - 5.6 % Final   HbA1c, POC (controlled diabetic range)  Date Value Ref Range Status  02/29/2020 7.9 (A) 0.0 - 7.0 % Final          Passed - Valid encounter within last 6 months    Recent Outpatient Visits           3 months ago Diabetes mellitus with stage 2 chronic kidney disease (HCC)   [facility] [place], Shea Stakes, NP   8 months ago Essential hypertension   [facility] [address] [facility] [place], [place] W, NP   1 year ago Diabetes mellitus with stage 2 chronic kidney disease [facility])   [facility] [place], Shea Stakes, NP   1 year ago Essential hypertension   [facility] [place], Shea Stakes, NP   1 year ago Diabetes mellitus with stage 2 chronic kidney disease [facility])   [facility] [place], Cornelius Moras, RPH-CPP

## 2021-04-07 ENCOUNTER — Other Ambulatory Visit: Payer: Self-pay

## 2021-04-07 ENCOUNTER — Telehealth (HOSPITAL_COMMUNITY): Payer: Self-pay | Admitting: *Deleted

## 2021-04-07 NOTE — Telephone Encounter (Signed)
[facility] called stating needs PA for [employer] per insurance. I sent a message to [facility] to start PA also called pt and offered samples until PA is approved. Pt aware and thanked me for the call.

## 2021-04-08 ENCOUNTER — Other Ambulatory Visit: Payer: Self-pay

## 2021-04-08 ENCOUNTER — Other Ambulatory Visit: Payer: Self-pay | Admitting: Nurse Practitioner

## 2021-04-08 DIAGNOSIS — E1122 Type 2 diabetes mellitus with diabetic chronic kidney disease: Secondary | ICD-10-CM

## 2021-04-08 MED ORDER — LANTUS SOLOSTAR 100 UNIT/ML ~~LOC~~ SOPN
PEN_INJECTOR | SUBCUTANEOUS | 0 refills | Status: DC
  Filled 2021-04-08: qty 15, 42d supply, fill #0
  Filled 2021-04-28: qty 15, 41d supply, fill #0

## 2021-04-08 NOTE — Telephone Encounter (Signed)
Requested medication (s) are due for refill today - refused Rx  Requested medication (s) are on the active medication list -yes  Future visit scheduled -yes  Last refill: 02/26/21  Notes to clinic: Rx was refused - needs appointment- is scheduled in 1 month with provider- sent for review of request  Requested Prescriptions  Pending Prescriptions Disp Refills   insulin glargine (LANTUS SOLOSTAR) 100 UNIT/ML Solostar Pen 30 mL 6    Sig: INJECT 36 UNITS INTO THE SKIN DAILY AT 10 PM.      Endocrinology:  Diabetes - Insulins Failed - 04/08/2021 10:00 AM      Failed - HBA1C is between 0 and 7.9 and within 180 days    Hemoglobin A1C  Date Value Ref Range Status  12/12/2020 8.1 (A) 4.0 - 5.6 % Final   HbA1c, POC (controlled diabetic range)  Date Value Ref Range Status  02/29/2020 7.9 (A) 0.0 - 7.0 % Final          Passed - Valid encounter within last 6 months    Recent Outpatient Visits           3 months ago Diabetes mellitus with stage 2 chronic kidney disease (HCC)   [facility] [place], Shea Stakes, NP   8 months ago Essential hypertension   [facility] [place], [place] W, NP   1 year ago Diabetes mellitus with stage 2 chronic kidney disease (HCC)   [facility] [place], Shea Stakes, NP   1 year ago Essential hypertension   [facility] [place], [place] W, NP   1 year ago Diabetes mellitus with stage 2 chronic kidney disease (HCC)   [facility] Drucilla Chalet, RPH-CPP       Future Appointments             In 1 month Claiborne Rigg, NP [facility]                 Requested Prescriptions  Pending Prescriptions Disp Refills   insulin glargine (LANTUS SOLOSTAR) 100 UNIT/ML Solostar Pen 30 mL 6    Sig: INJECT 36 UNITS INTO THE SKIN DAILY AT 10 PM.      Endocrinology:  Diabetes - Insulins  Failed - 04/08/2021 10:00 AM      Failed - HBA1C is between 0 and 7.9 and within 180 days    Hemoglobin A1C  Date Value Ref Range Status  12/12/2020 8.1 (A) 4.0 - 5.6 % Final   HbA1c, POC (controlled diabetic range)  Date Value Ref Range Status  02/29/2020 7.9 (A) 0.0 - 7.0 % Final          Passed - Valid encounter within last 6 months    Recent Outpatient Visits           3 months ago Diabetes mellitus with stage 2 chronic kidney disease (HCC)   [facility] [place], Shea Stakes, NP   8 months ago Essential hypertension   [place] [address] [facility] [place], [place] W, NP   1 year ago Diabetes mellitus with stage 2 chronic kidney disease [facility])   [facility] [place], Shea Stakes, NP   1 year ago Essential hypertension   [place] [facility] [place], [place] W, NP   1 year ago Diabetes mellitus with stage 2 chronic  kidney disease [facility])   [facility] [place], Cornelius Moras, RPH-CPP       Future Appointments             In 1 month Claiborne Rigg, NP [employer] [facility]

## 2021-04-15 ENCOUNTER — Other Ambulatory Visit: Payer: Self-pay

## 2021-04-24 ENCOUNTER — Other Ambulatory Visit (HOSPITAL_COMMUNITY): Payer: Self-pay

## 2021-04-24 ENCOUNTER — Telehealth (HOSPITAL_COMMUNITY): Payer: Self-pay | Admitting: Pharmacy Technician

## 2021-04-24 ENCOUNTER — Other Ambulatory Visit: Payer: Self-pay

## 2021-04-24 ENCOUNTER — Encounter (HOSPITAL_COMMUNITY): Payer: Self-pay | Admitting: Internal Medicine

## 2021-04-24 ENCOUNTER — Ambulatory Visit (HOSPITAL_BASED_OUTPATIENT_CLINIC_OR_DEPARTMENT_OTHER)
Admission: RE | Admit: 2021-04-24 | Discharge: 2021-04-24 | Disposition: A | Discharge status: Home / Self Care | Payer: Medicaid Other | Source: Ambulatory Visit | Attending: Internal Medicine | Admitting: Internal Medicine

## 2021-04-24 ENCOUNTER — Ambulatory Visit (HOSPITAL_COMMUNITY)
Admission: RE | Admit: 2021-04-24 | Discharge: 2021-04-24 | Disposition: A | Discharge status: Home / Self Care | Payer: Medicaid Other | Source: Ambulatory Visit | Attending: Internal Medicine | Admitting: Internal Medicine

## 2021-04-24 VITALS — BP 102/60 | HR 78 | Wt 319.8 lb

## 2021-04-24 DIAGNOSIS — Z7982 Long term (current) use of aspirin: Secondary | ICD-10-CM | POA: Diagnosis not present

## 2021-04-24 DIAGNOSIS — Z794 Long term (current) use of insulin: Secondary | ICD-10-CM | POA: Diagnosis not present

## 2021-04-24 DIAGNOSIS — I110 Hypertensive heart disease with heart failure: Secondary | ICD-10-CM | POA: Insufficient documentation

## 2021-04-24 DIAGNOSIS — F1490 Cocaine use, unspecified, uncomplicated: Secondary | ICD-10-CM | POA: Diagnosis not present

## 2021-04-24 DIAGNOSIS — E119 Type 2 diabetes mellitus without complications: Secondary | ICD-10-CM | POA: Insufficient documentation

## 2021-04-24 DIAGNOSIS — F17210 Nicotine dependence, cigarettes, uncomplicated: Secondary | ICD-10-CM | POA: Insufficient documentation

## 2021-04-24 DIAGNOSIS — Z79899 Other long term (current) drug therapy: Secondary | ICD-10-CM | POA: Insufficient documentation

## 2021-04-24 DIAGNOSIS — E1122 Type 2 diabetes mellitus with diabetic chronic kidney disease: Secondary | ICD-10-CM | POA: Diagnosis not present

## 2021-04-24 DIAGNOSIS — E669 Obesity, unspecified: Secondary | ICD-10-CM | POA: Diagnosis not present

## 2021-04-24 DIAGNOSIS — F1410 Cocaine abuse, uncomplicated: Secondary | ICD-10-CM | POA: Insufficient documentation

## 2021-04-24 DIAGNOSIS — Z720 Tobacco use: Secondary | ICD-10-CM

## 2021-04-24 DIAGNOSIS — I5022 Chronic systolic (congestive) heart failure: Secondary | ICD-10-CM | POA: Diagnosis not present

## 2021-04-24 DIAGNOSIS — Z6838 Body mass index (BMI) 38.0-38.9, adult: Secondary | ICD-10-CM | POA: Insufficient documentation

## 2021-04-24 DIAGNOSIS — Z8249 Family history of ischemic heart disease and other diseases of the circulatory system: Secondary | ICD-10-CM | POA: Diagnosis not present

## 2021-04-24 DIAGNOSIS — I472 Ventricular tachycardia: Secondary | ICD-10-CM | POA: Diagnosis not present

## 2021-04-24 DIAGNOSIS — I428 Other cardiomyopathies: Secondary | ICD-10-CM | POA: Insufficient documentation

## 2021-04-24 DIAGNOSIS — Z9119 Patient's noncompliance with other medical treatment and regimen: Secondary | ICD-10-CM | POA: Diagnosis not present

## 2021-04-24 DIAGNOSIS — Z880 Allergy status to penicillin: Secondary | ICD-10-CM | POA: Diagnosis not present

## 2021-04-24 DIAGNOSIS — Z95810 Presence of automatic (implantable) cardiac defibrillator: Secondary | ICD-10-CM | POA: Diagnosis not present

## 2021-04-24 DIAGNOSIS — I080 Rheumatic disorders of both mitral and aortic valves: Secondary | ICD-10-CM | POA: Diagnosis not present

## 2021-04-24 DIAGNOSIS — N182 Chronic kidney disease, stage 2 (mild): Secondary | ICD-10-CM

## 2021-04-24 DIAGNOSIS — I10 Essential (primary) hypertension: Secondary | ICD-10-CM

## 2021-04-24 LAB — BASIC METABOLIC PANEL
Anion gap: 12 (ref 5–15)
BUN: 25 mg/dL — ABNORMAL HIGH (ref 6–20)
CO2: 22 mmol/L (ref 22–32)
Calcium: 8.9 mg/dL (ref 8.9–10.3)
Chloride: 101 mmol/L (ref 98–111)
Creatinine, Ser: 1.35 mg/dL — ABNORMAL HIGH (ref 0.61–1.24)
GFR, Estimated: >60 mL/min (ref >60)
Glucose, Bld: 136 mg/dL — ABNORMAL HIGH (ref 70–99)
Potassium: 4 mmol/L (ref 3.5–5.1)
Sodium: 135 mmol/L (ref 135–145)

## 2021-04-24 LAB — BRAIN NATRIURETIC PEPTIDE: B Natriuretic Peptide: 92.4 pg/mL (ref 0.0–100.0)

## 2021-04-24 LAB — ECHOCARDIOGRAM COMPLETE
Calc EF: 37.8 %
S' Lateral: 3.7 cm
Single Plane A2C EF: 35.6 %
Single Plane A4C EF: 40.7 %

## 2021-04-24 MED ORDER — SILDENAFIL CITRATE 100 MG PO TABS
100.0000 mg | ORAL_TABLET | Freq: Every day | ORAL | 0 refills | Status: DC | PRN
  Filled 2021-04-24: qty 10, 30d supply, fill #0
  Filled 2021-04-28 – 2021-08-06 (×3): qty 10, 10d supply, fill #0

## 2021-04-24 NOTE — Progress Notes (Signed)
Medication Samples have been provided to the patient.  Drug name: Entresot       Strength: 49/51mg         Qty: 1  LOT: [id number]  Exp.Date: 7/24  Dosing instructions: take 1 tab Twice daily   The patient has been instructed regarding the correct time, dose, and frequency of taking this medication, including desired effects and most common side effects.   Lakie Mclouth 3:52 PM 04/24/2021

## 2021-04-24 NOTE — Telephone Encounter (Signed)
Patient Advocate Encounter   Received notification from [facility] that prior authorization for Sherryll Burger is required.   PA submitted on CoverMyMeds Key [id number] Status is pending   Will continue to follow.

## 2021-04-24 NOTE — Patient Instructions (Signed)
Labs done today, your results will be available in MyChart, we will contact you for abnormal readings.  Please call our office in November to schedule your follow up appointment  If you have any questions or concerns before your next appointment please send us a message through mychart or call our office at [phone].    TO LEAVE A MESSAGE FOR THE NURSE SELECT OPTION 2, PLEASE LEAVE A MESSAGE INCLUDING: YOUR NAME DATE OF BIRTH CALL BACK NUMBER REASON FOR CALL**this is important as we prioritize the call backs  YOU WILL RECEIVE A CALL BACK THE SAME DAY AS LONG AS YOU CALL BEFORE 4:00 PM  At the [facility], you and your health needs are our priority. As part of our continuing mission to provide you with exceptional heart care, we have created designated Provider Care Teams. These Care Teams include your primary Cardiologist (physician) and Advanced Practice Providers (APPs- Physician Assistants and Nurse Practitioners) who all work together to provide you with the care you need, when you need it.   You may see any of the following providers on your designated Care Team at your next follow up: Dr Daniel Bensimhon Dr Dalton McLean Dr Brandon Winfrey Amy Clegg, NP Brittainy Simmons, PA Jessica Milford,NP Lauren Kemp, PharmD   Please be sure to bring in all your medications bottles to every appointment.   

## 2021-04-24 NOTE — Progress Notes (Signed)
  Echocardiogram 2D Echocardiogram has been performed.  Douglas [name] 04/24/2021, 2:57 PM

## 2021-04-24 NOTE — Progress Notes (Signed)
ADVANCED HF CLINIC NOTE  Patient ID: Douglas [name], male   DOB: April 25, 1976, 45 y.o.   MRN: [id number]  EP: Dr Caryl Comes Primary Cardiologist: Dr. Haroldine Laws PCP: [name] NP  History of Present Illness:  Douglas [name] is a 45 y.o. male with a h/o HTN, HL, obesity, smoker, cocaine abuse,  NICM and chronic systolic heart failure. He is S/P Medtronic ICD due to NICM. Management has been complicated by severe non-compliance.   Underwent cath in 2006 which showed normal cors with EF 20-25%.   EF had recovered in late 2013 but has again fallen to 15% per echo 01/20/13.  He was admitted to [facility] 02/21/13 from clinic for low output symptoms and volume overload.  He required IV lasix and metolazone.  He diuresed 19 pounds with discharge weight of 254 pounds.  He had runs of VT therefore he was started on amiodarone per EP and Lifevest was placed at discharge.  He underwent RHC on 02/22/13.  He has not required metolazone.   He was admitted in 1/16 with VT and ICD shock x 2.   Echo 6/20 -->EF 25-30%   He was COVID + 11/14/20. He quarantined and recovered.   Echo today 04/24/21 EF 30-35% RV ok. Personally reviewed  Today he returns for HF follow up. Feels good. Working full time at [employer] at [employer]. Gets around pretty good. Mild DOE if he hurries. No edema, orthopnea or PND. Still smoking a few cigs per day. Occasional cocaine. Complaint with meds.   ICD interrogation: No AF/VT. Fluid looks great. Activity 4 hr/day Personally reviewed  RHC 02/22/13  RA = 12  RV = 49/6/15  PA = 54/29 (41)  PCW = 28 (v= 40)  Fick cardiac output/index = 4.3/1.7  PVR = 2.6 [place]  SVR = 1580 dynes  FA sat = 98%  PA sat = 53%, 54%  Non-invasive BP = 124/86 (97)  RHC 05/03/13 RA = 2  RV = 29/2/2  PA = 26/10 (17)  PCW = 3  Fick cardiac output/index = 6.1/2.3  PVR = 2.3  FA sat = 93%  PA sat = 65%, 67%  ECHO 06/25/13 EF 20-25% ECHO 05/01/14 EF 35-40%, moderately dilated LV, mild LVH, diffuse  hypokinesis Echo 6/16 EF 25-30%, mild LVH, moderately dilated LV.  ECHO 09/23/2016 EF 25-30%. ECHO 08/19/2017 EF 40-45% grade IDD  August 2012 CPX VO2 20 SH: lives with his sister. Rare ETOH, smokes rarely. Remote cocaine. FH: Mom has CAD.    Review of systems complete and found to be negative unless listed in HPI.   Past Medical History:  Diagnosis Date   Depression, major, single episode 01/14/2017   Diabetes mellitus [facility])    ED (erectile dysfunction)    History of syncope 01/29/2015   Hypertension    ICD (implantable cardioverter-defibrillator) discharge 11/30/2014   On 11/30/14. Asymptomatic.    Nonischemic cardiomyopathy ([place])    a.  echo 4/06: EF 30%, mild to mod MR, mild RAE, inf HK, lat HK , ant HK;    b.  cath 4/06: no CAD, EF 20-25%   NSVT (nonsustained ventricular tachycardia) (HCC)    Obesity    Systolic CHF, chronic (HCC)    EF 11/17 25-30%, s/p ICD    Current Outpatient Medications  Medication Sig Dispense Refill   Accu-Chek Softclix Lancets lancets Check blood sugar twice daily E11.8 100 each 2   acetaminophen (TYLENOL) 500 MG tablet Take 1 tablet (500 mg total) by  mouth every 6 (six) hours as needed. 30 tablet 0   aspirin EC 81 MG EC tablet Take 1 tablet (81 mg total) by mouth daily. 30 tablet 0   atorvastatin (LIPITOR) 40 MG tablet TAKE 1 TABLET (40 MG TOTAL) BY MOUTH DAILY. 30 tablet 0   Blood Glucose Calibration (ACCU-CHEK GUIDE CONTROL) LIQD 1 each by In Vitro route once as needed for up to 1 dose. 1 each 0   Blood Glucose Monitoring Suppl (ACCU-CHEK GUIDE ME) w/Device KIT Check blood sugar twice daily E11.8 1 kit 0   Blood Pressure Monitor DEVI Please provide patient with insurance approved blood pressure monitor 1 Device 0   carvedilol (COREG) 12.5 MG tablet TAKE 1 TABLET (12.5 MG TOTAL) BY MOUTH 2 (TWO) TIMES DAILY WITH A MEAL. 180 tablet 3   dapagliflozin propanediol (FARXIGA) 10 MG TABS tablet TAKE 1 TABLET (10 MG TOTAL) BY MOUTH DAILY BEFORE BREAKFAST. 90  tablet 3   glucose blood (ACCU-CHEK GUIDE) test strip Check blood sugar twice daily E11.8 100 each 2   ibuprofen (ADVIL,MOTRIN) 600 MG tablet Take 1 tablet (600 mg total) by mouth every 6 (six) hours as needed. 30 tablet 0   insulin glargine (LANTUS SOLOSTAR) 100 UNIT/ML Solostar Pen INJECT 36 UNITS INTO THE SKIN DAILY AT 10 PM. 15 mL 0   Insulin Pen Needle 32G X 4 MM MISC INJECT 1 APPLICATION INTO THE SKIN DAILY. THE PATIENT IS INSULIN REQUIRING, ICD 10 CODE E11.9. THE PATIENT INJECTS 1 TIMES PER DAY. 100 each 3   potassium chloride SA (KLOR-CON) 20 MEQ tablet TAKE 2 TABLETS (40 MEQ TOTAL) BY MOUTH 2 (TWO) TIMES DAILY. 120 tablet 11   sacubitril-valsartan (ENTRESTO) 49-51 MG TAKE 1 TABLET BY MOUTH 2 (TWO) TIMES DAILY. 60 tablet 11   spironolactone (ALDACTONE) 25 MG tablet TAKE 1 TABLET (25 MG TOTAL) BY MOUTH DAILY. 30 tablet 11   torsemide (DEMADEX) 100 MG tablet TAKE 1 TABLET (100 MG TOTAL) BY MOUTH EVERY MORNING. MAY ALSO TAKE 0.5 TABLETS (50 MG TOTAL) AT BEDTIME AS NEEDED. 90 tablet 3   No current facility-administered medications for this encounter.    Allergies  Allergen Reactions   Bydureon [Exenatide] Rash   Penicillins Other (See Comments)    Unknown childhood reaction     Vitals:   04/24/21 1500  BP: 102/60  Pulse: 78  SpO2: 95%  Weight: (!) 145.1 kg (319 lb 12.8 oz)   Wt Readings from Last 3 Encounters:  04/24/21 (!) 145.1 kg (319 lb 12.8 oz)  01/19/21 (!) 148.2 kg (326 lb 12.8 oz)  12/12/20 (!) 151.5 kg (334 lb)   PHYSICAL EXAM: General:  Well appearing. No resp difficulty HEENT: normal Neck: supple. no JVD. Carotids 2+ bilat; no bruits. No lymphadenopathy or thryomegaly appreciated. Cor: PMI nondisplaced. Regular rate & rhythm. No rubs, gallops or murmurs. Lungs: clear Abdomen: obese soft, nontender, nondistended. No hepatosplenomegaly. No bruits or masses. Good bowel sounds. Extremities: no cyanosis, clubbing, rash, edema Neuro: alert & orientedx3, cranial  nerves grossly intact. moves all 4 extremities w/o difficulty. Affect pleasant   Assessment/Plan:  1) Chronic systolic HF: Nonischemic cardiomyopathy. S/p Medtronic ICD  - Echo 09/2016 with EF 45-85%, RV systolic function mildly reduced - ECHO 08/19/2017 EF 40-45%. - Echo 6/20 EF 25-30% - Echo today 04/24/21 EF 30-35% RV ok. Personally reviewed - ICD interrogation: No AF/VT. Fluid looks great. Activity 4 hr/day Personally reviewed - Doing well. NYHA II  - Continue torsemide 100 mg daily. Needs to hold evening  torsemide until Friday.   - Continue carvediol 12.5 mg twice a day.  - Continue Bidil 1 tab BID (says he gets a HA if he takes more) - Continue entresto 49-51 mg twice a day.  (Failed 97-103 due to low BP)  -Continue Farxiga 10 mg daily  - Continue spiro 25 mg daily.  2) HTN:  -  Blood pressure well controlled. Continue current regimen.  3) VT:  - No episodes > 1 year.   4) Cocaine abuse:   - Discussed cessation   5) Tobacco Abuse - Discussed cessation.   6) DMII- on insulin. - Continue Farxiga 10 mg daily.   7. Obesity - Body mass index is 38.93 kg/m.  Glori Bickers, MD  04/24/2021

## 2021-04-24 NOTE — Addendum Note (Signed)
Encounter addended by: Noralee Space, RN on: 04/24/2021 4:01 PM  Actions taken: Clinical Note Signed, Order list changed, Pharmacy for encounter modified

## 2021-04-24 NOTE — Addendum Note (Signed)
Encounter addended by: Noralee Space, RN on: 04/24/2021 3:40 PM  Actions taken: Order list changed, Diagnosis association updated, Clinical Note Signed, Charge Capture section accepted

## 2021-04-28 ENCOUNTER — Other Ambulatory Visit: Payer: Self-pay

## 2021-04-28 ENCOUNTER — Telehealth: Payer: Self-pay | Admitting: Nurse Practitioner

## 2021-04-28 DIAGNOSIS — H40053 Ocular hypertension, bilateral: Secondary | ICD-10-CM | POA: Diagnosis not present

## 2021-04-28 DIAGNOSIS — H40013 Open angle with borderline findings, low risk, bilateral: Secondary | ICD-10-CM | POA: Diagnosis not present

## 2021-04-28 DIAGNOSIS — H5213 Myopia, bilateral: Secondary | ICD-10-CM | POA: Diagnosis not present

## 2021-04-28 DIAGNOSIS — H2513 Age-related nuclear cataract, bilateral: Secondary | ICD-10-CM | POA: Diagnosis not present

## 2021-04-28 DIAGNOSIS — H52223 Regular astigmatism, bilateral: Secondary | ICD-10-CM | POA: Diagnosis not present

## 2021-04-28 DIAGNOSIS — H524 Presbyopia: Secondary | ICD-10-CM | POA: Diagnosis not present

## 2021-04-28 NOTE — Telephone Encounter (Signed)
Patient called in to see if he could get a prescription for antibiotics or some medicine because he tested positive for Covid. Patient ask for a call back from the [facility] or a nurse.

## 2021-04-28 NOTE — Telephone Encounter (Signed)
Advanced Heart Failure Patient Advocate Encounter  Prior Authorization for Douglas [name] has been approved.    PA# [id number] Effective dates: 04/24/21 through 04/24/22  [name] Asa, CPhT

## 2021-04-29 ENCOUNTER — Other Ambulatory Visit: Payer: Self-pay

## 2021-04-29 ENCOUNTER — Encounter: Payer: Self-pay | Admitting: Nurse Practitioner

## 2021-04-29 ENCOUNTER — Ambulatory Visit: Payer: Medicaid Other | Attending: Nurse Practitioner | Admitting: Nurse Practitioner

## 2021-04-29 DIAGNOSIS — U071 COVID-19: Secondary | ICD-10-CM

## 2021-04-29 MED ORDER — ACETAMINOPHEN 500 MG PO TABS: 500.0000 mg | ORAL_TABLET | Freq: Four times a day (QID) | ORAL | 0 refills | Status: DC | PRN

## 2021-04-29 MED ORDER — FLUTICASONE PROPIONATE 50 MCG/ACT NA SUSP
2.0000 | Freq: Every day | NASAL | 6 refills | Status: DC
  Filled 2021-04-29: qty 16, 30d supply, fill #0

## 2021-04-29 MED ORDER — DEXTROMETHORPHAN-GUAIFENESIN 5-100 MG/5ML PO LIQD
10.0000 mL | Freq: Four times a day (QID) | ORAL | 0 refills | Status: AC | PRN
  Filled 2021-04-29: qty 237, 6d supply, fill #0

## 2021-04-29 NOTE — Progress Notes (Signed)
Virtual Visit via Telephone Note Due to national recommendations of social distancing due to COVID 19, telehealth visit is felt to be most appropriate for this patient at this time.  I discussed the limitations, risks, security and privacy concerns of performing an evaluation and management service by telephone and the availability of in person appointments. I also discussed with the patient that there may be a patient responsible charge related to this service. The patient expressed understanding and agreed to proceed.    I connected with Douglas [name] on 04/29/21  at   1:30 PM EDT  EDT by telephone and verified that I am speaking with the correct person using two identifiers.  Location of Patient: Private Residence   Location of Provider: [facility]    Persons participating in Telemedicine visit: Bertram Denver FNP-BC Egidio Mardene Celeste    History of Present Illness: Telemedicine visit for: COVID He has a past medical history of Depression, major, single episode (01/14/2017), Diabetes mellitus, ED, Syncope (01/29/2015), Hypertension, ICD (11/30/2014), Nonischemic cardiomyopathy (HCC), NSVT , Obesity, and Systolic CHF, chronic.   COVID States he gets tested every Monday and Thursday at his job and he tested positive for COVID 2 days ago on Monday.  Symptoms ymptoms started 2 days ago after he was tested and currently include headache and stuffy nose.  He does endorse chills on Sunday night prior to being tested positive on Monday.  He has received 2/2 Pfizer vaccine but did not receive the booster vaccination.  I did discuss with him Paxlovid and monoclonal antibody infusion and at this time he declines both and would prefer conservative treatment at this time.  He is aware that if his symptoms progress over the next 24 to 48 hours he will contact the office and I will refer him for further treatment.    Past Medical History:  Diagnosis Date   Depression,  major, single episode 01/14/2017   Diabetes mellitus [facility])    ED (erectile dysfunction)    History of syncope 01/29/2015   Hypertension    ICD (implantable cardioverter-defibrillator) discharge 11/30/2014   On 11/30/14. Asymptomatic.    Nonischemic cardiomyopathy (HCC)    a.  echo 4/06: EF 30%, mild to mod MR, mild RAE, inf HK, lat HK , ant HK;    b.  cath 4/06: no CAD, EF 20-25%   NSVT (nonsustained ventricular tachycardia) (HCC)    Obesity    Systolic CHF, chronic (HCC)    EF 11/17 25-30%, s/p ICD    Past Surgical History:  Procedure Laterality Date   CARDIAC CATHETERIZATION  09/2011; 02/2013; 04/2013   CARDIAC DEFIBRILLATOR PLACEMENT  08/23/2013   IMPLANTABLE CARDIOVERTER DEFIBRILLATOR IMPLANT N/A 08/23/2013   Procedure: IMPLANTABLE CARDIOVERTER DEFIBRILLATOR IMPLANT;  Surgeon: Duke Salvia, MD;  Location: [facility] CATH LAB;  Service: Cardiovascular;  Laterality: N/A;   LEFT AND RIGHT HEART CATHETERIZATION WITH CORONARY ANGIOGRAM N/A 09/20/2011   Procedure: LEFT AND RIGHT HEART CATHETERIZATION WITH CORONARY ANGIOGRAM;  Surgeon: Dolores Patty, MD;  Location[facility] C[facility] LAB;  Service: Cardiovascular;  Laterality: N/A;   MULTIPLE EXTRACTIONS WITH ALVEOLOPLASTY N/A 01/26/2013   Procedure:  EXTRACION  TOOTH # 19 WITH ALVEOLOPLASTY;  Surgeon: Charlynne Pander, DDS;  Location: [facility] OR;  Service: Oral Surgery;  Laterality: N/A;   RIGHT HEART CATHETERIZATION N/A 02/22/2013   Procedure: RIGHT HEART CATH;  Surgeon: Dolores Patty, MD;  Location: [facility] CATH LAB;  Service: Cardiovascular;  Laterality: N/A;   RIGHT HEART CATHETERIZATION N/A  05/03/2013   Procedure: RIGHT HEART CATH;  Surgeon: Dolores Patty, MD;  Location: 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    Drug use: Not Currently    Types: Cocaine    Comment: 08/10/17 - last use one week ago   Sexual activity: Yes  Other Topics Concern   Not on file  Social History Narrative   Referred to PCP- appointment made for 09/04/18 at 9:30am at [facility]      Provided with food pantry and free meal list- patient reported sometimes having issues paying for food but reports he does received food stamps and works part time- he does not pay for housing as he lives with his sister.      Patient uses his mother's car and reports no issues getting transport to to medical appointments.   Social Determinants of Health   Financial Resource Strain: Not on file  Food Insecurity: Not on file  Transportation Needs: Not on file  Physical Activity: Not on file  Stress: Not on file  Social Connections: Not on file     Observations/Objective: Awake, alert and oriented x 3   Review of Systems  Constitutional:  Positive for chills and fever. Negative for malaise/fatigue.       Both fever and chills have resolved  HENT:  Positive for congestion. Negative for ear discharge, ear pain, hearing loss, sinus pain and sore throat.   Eyes: Negative.   Respiratory:  Negative for cough, sputum production, shortness of breath and wheezing.   Cardiovascular: Negative.  Negative for chest pain, orthopnea and  leg swelling.  Gastrointestinal: Negative.  Negative for abdominal pain, diarrhea, nausea and vomiting.  Neurological:  Positive for headaches. Negative for dizziness and focal weakness.  Endo/Heme/Allergies:  Negative for environmental allergies.  Psychiatric/Behavioral: Negative.     Assessment and Plan: Diagnoses and all orders for this visit:  COVID-19 -     fluticasone (FLONASE) 50 MCG/ACT nasal spray; Place 2 sprays into both nostrils daily. -     acetaminophen (TYLENOL) 500 MG tablet; Take 1 tablet (500 mg total) by mouth every 6 (six) hours as needed. -     Dextromethorphan-guaiFENesin 5-100 MG/5ML LIQD; Take 10 mLs by mouth every 6 (six) hours as needed for up to 10 days.    Follow Up Instructions Return if symptoms worsen or fail to  improve.     I discussed the assessment and treatment plan with the patient. The patient was provided an opportunity to ask questions and all were answered. The patient agreed with the plan and demonstrated an understanding of the instructions.   The patient was advised to call back or seek an in-person evaluation if the symptoms worsen or if the condition fails to improve as anticipated.  I provided 15 minutes of non-face-to-face time during this encounter including median intraservice time, reviewing previous notes, labs, imaging, medications and explaining diagnosis and management.  Claiborne Rigg, FNP-BC

## 2021-04-29 NOTE — Telephone Encounter (Signed)
Mediation has been sent to pharmacy.

## 2021-04-30 ENCOUNTER — Other Ambulatory Visit: Payer: Self-pay

## 2021-04-30 ENCOUNTER — Other Ambulatory Visit: Payer: Self-pay | Admitting: Family Medicine

## 2021-04-30 DIAGNOSIS — E782 Mixed hyperlipidemia: Secondary | ICD-10-CM

## 2021-04-30 MED FILL — Torsemide Tab 100 MG: ORAL | 34 days supply | Qty: 51 | Fill #1 | Status: AC

## 2021-04-30 MED FILL — Spironolactone Tab 25 MG: ORAL | 30 days supply | Qty: 30 | Fill #2 | Status: AC

## 2021-04-30 NOTE — Telephone Encounter (Signed)
Requested medication (s) are due for refill today- yes  Requested medication (s) are on the active medication list -yes  Future visit scheduled -yes  Last refill: 04/03/21  Notes to clinic: Patient has scheduled appointment-06/01/21- last RF- notes must have appointment for RF- sent for review  Requested Prescriptions  Pending Prescriptions Disp Refills   atorvastatin (LIPITOR) 40 MG tablet 30 tablet 0    Sig: TAKE 1 TABLET (40 MG TOTAL) BY MOUTH DAILY.      Cardiovascular:  Antilipid - Statins Failed - 04/30/2021  2:33 PM      Failed - Total Cholesterol in normal range and within 360 days    Cholesterol, Total  Date Value Ref Range Status  02/29/2020 158 100 - 199 mg/dL Final          Failed - LDL in normal range and within 360 days    LDL Chol Calc (NIH)  Date Value Ref Range Status  02/29/2020 89 0 - 99 mg/dL Final          Failed - HDL in normal range and within 360 days    HDL  Date Value Ref Range Status  02/29/2020 36 (L) >39 mg/dL Final          Failed - Triglycerides in normal range and within 360 days    Triglycerides  Date Value Ref Range Status  02/29/2020 194 (H) 0 - 149 mg/dL Final          Passed - Patient is not pregnant      Passed - Valid encounter within last 12 months    Recent Outpatient Visits           Yesterday COVID-19   [facility] [place], [place] W, NP   4 months ago Diabetes mellitus with stage 2 chronic kidney disease [facility])   [facility] [place], Shea Stakes, NP   9 months ago Essential hypertension   [place] [facility] [place], [place] W, NP   1 year ago Diabetes mellitus with stage 2 chronic kidney disease [facility])   [place] [facility] [place], Shea Stakes, NP   1 year ago Essential hypertension    [facility] [place], Shea Stakes, NP       Future Appointments             In 1 month Claiborne Rigg, NP [facility]                 Requested Prescriptions  Pending Prescriptions Disp Refills   atorvastatin (LIPITOR) 40 MG tablet 30 tablet 0    Sig: TAKE 1 TABLET (40 MG TOTAL) BY MOUTH DAILY.      Cardiovascular:  Antilipid - Statins Failed - 04/30/2021  2:33 PM      Failed - Total Cholesterol in normal range and within 360 days    Cholesterol, Total  Date Value Ref Range Status  02/29/2020 158 100 - 199 mg/dL Final          Failed - LDL in normal range and within 360 days    LDL Chol Calc (NIH)  Date Value Ref Range Status  02/29/2020 89 0 - 99 mg/dL Final          Failed - HDL in normal range and within 360 days    HDL  Date Value Ref Range Status  02/29/2020 36 (L) >39 mg/dL Final  Failed - Triglycerides in normal range and within 360 days    Triglycerides  Date Value Ref Range Status  02/29/2020 194 (H) 0 - 149 mg/dL Final          Passed - Patient is not pregnant      Passed - Valid encounter within last 12 months    Recent Outpatient Visits           Yesterday COVID-19   [facility] [place], [place] W, NP   4 months ago Diabetes mellitus with stage 2 chronic kidney disease [facility])   [facility] Claiborne Rigg, NP   9 months ago Essential hypertension   [place] [facility] [place], [place] W, NP   1 year ago Diabetes mellitus with stage 2 chronic kidney disease [facility])   [place] [facility] Claiborne Rigg, NP   1 year ago Essential hypertension    [facility] [place], Shea Stakes, NP       Future Appointments             In 1 month Claiborne Rigg, NP [facility]

## 2021-05-01 ENCOUNTER — Other Ambulatory Visit: Payer: Self-pay

## 2021-05-01 MED ORDER — ATORVASTATIN CALCIUM 40 MG PO TABS
ORAL_TABLET | Freq: Every day | ORAL | 0 refills | Status: DC
  Filled 2021-05-01 – 2021-06-05 (×2): qty 30, 30d supply, fill #0

## 2021-05-05 ENCOUNTER — Ambulatory Visit (INDEPENDENT_AMBULATORY_CARE_PROVIDER_SITE_OTHER): Payer: Medicaid Other

## 2021-05-05 DIAGNOSIS — I428 Other cardiomyopathies: Secondary | ICD-10-CM

## 2021-05-07 LAB — CUP PACEART REMOTE DEVICE CHECK
Battery Remaining Longevity: 41 mo
Battery Voltage: 2.95 V
Brady Statistic RV Percent Paced: 0.03 %
Date Time Interrogation Session: 20220623042211
HighPow Impedance: 51 Ohm
HighPow Impedance: 66 Ohm
Implantable Lead Implant Date: 20141009
Implantable Lead Location: 753860
Implantable Lead Model: 7121
Implantable Pulse Generator Implant Date: 20141009
Lead Channel Impedance Value: 323 Ohm
Lead Channel Impedance Value: 437 Ohm
Lead Channel Pacing Threshold Amplitude: 0.75 V
Lead Channel Pacing Threshold Pulse Width: 0.4 ms
Lead Channel Sensing Intrinsic Amplitude: 9.375 mV
Lead Channel Sensing Intrinsic Amplitude: 9.375 mV
Lead Channel Setting Pacing Amplitude: 2.5 V
Lead Channel Setting Pacing Pulse Width: 0.4 ms
Lead Channel Setting Sensing Sensitivity: 0.45 mV

## 2021-05-08 ENCOUNTER — Other Ambulatory Visit: Payer: Self-pay

## 2021-05-25 NOTE — Progress Notes (Signed)
Remote ICD transmission.   

## 2021-06-02 ENCOUNTER — Other Ambulatory Visit: Payer: Self-pay

## 2021-06-02 ENCOUNTER — Encounter: Payer: Self-pay | Admitting: Nurse Practitioner

## 2021-06-02 ENCOUNTER — Ambulatory Visit: Payer: Medicaid Other | Attending: Nurse Practitioner | Admitting: Nurse Practitioner

## 2021-06-02 VITALS — BP 102/67 | HR 89 | Resp 16 | Wt 322.6 lb

## 2021-06-02 DIAGNOSIS — Z6839 Body mass index (BMI) 39.0-39.9, adult: Secondary | ICD-10-CM | POA: Insufficient documentation

## 2021-06-02 DIAGNOSIS — Z95810 Presence of automatic (implantable) cardiac defibrillator: Secondary | ICD-10-CM | POA: Diagnosis not present

## 2021-06-02 DIAGNOSIS — Z79899 Other long term (current) drug therapy: Secondary | ICD-10-CM | POA: Insufficient documentation

## 2021-06-02 DIAGNOSIS — E1122 Type 2 diabetes mellitus with diabetic chronic kidney disease: Secondary | ICD-10-CM | POA: Diagnosis not present

## 2021-06-02 DIAGNOSIS — I130 Hypertensive heart and chronic kidney disease with heart failure and stage 1 through stage 4 chronic kidney disease, or unspecified chronic kidney disease: Secondary | ICD-10-CM | POA: Insufficient documentation

## 2021-06-02 DIAGNOSIS — I10 Essential (primary) hypertension: Secondary | ICD-10-CM

## 2021-06-02 DIAGNOSIS — I5042 Chronic combined systolic (congestive) and diastolic (congestive) heart failure: Secondary | ICD-10-CM | POA: Insufficient documentation

## 2021-06-02 DIAGNOSIS — D649 Anemia, unspecified: Secondary | ICD-10-CM | POA: Insufficient documentation

## 2021-06-02 DIAGNOSIS — E785 Hyperlipidemia, unspecified: Secondary | ICD-10-CM | POA: Insufficient documentation

## 2021-06-02 DIAGNOSIS — N182 Chronic kidney disease, stage 2 (mild): Secondary | ICD-10-CM | POA: Insufficient documentation

## 2021-06-02 DIAGNOSIS — Z833 Family history of diabetes mellitus: Secondary | ICD-10-CM | POA: Diagnosis not present

## 2021-06-02 DIAGNOSIS — Z794 Long term (current) use of insulin: Secondary | ICD-10-CM | POA: Insufficient documentation

## 2021-06-02 DIAGNOSIS — R21 Rash and other nonspecific skin eruption: Secondary | ICD-10-CM | POA: Diagnosis not present

## 2021-06-02 DIAGNOSIS — Z7982 Long term (current) use of aspirin: Secondary | ICD-10-CM | POA: Insufficient documentation

## 2021-06-02 DIAGNOSIS — Z8249 Family history of ischemic heart disease and other diseases of the circulatory system: Secondary | ICD-10-CM | POA: Insufficient documentation

## 2021-06-02 DIAGNOSIS — Z1211 Encounter for screening for malignant neoplasm of colon: Secondary | ICD-10-CM

## 2021-06-02 LAB — POCT GLYCOSYLATED HEMOGLOBIN (HGB A1C): HbA1c, POC (controlled diabetic range): 8.3 % — AB (ref 0.0–7.0)

## 2021-06-02 LAB — GLUCOSE, POCT (MANUAL RESULT ENTRY): POC Glucose: 141 mg/dl — AB (ref 70–99)

## 2021-06-02 MED ORDER — DAPAGLIFLOZIN PROPANEDIOL 10 MG PO TABS
ORAL_TABLET | Freq: Every day | ORAL | 3 refills | Status: DC
  Filled 2021-06-02: qty 30, 30d supply, fill #0
  Filled 2021-07-08 – 2021-07-22 (×2): qty 30, 30d supply, fill #1
  Filled 2021-08-24: qty 30, 30d supply, fill #2
  Filled 2021-10-05: qty 30, 30d supply, fill #3
  Filled 2021-11-17: qty 30, 30d supply, fill #4

## 2021-06-02 MED ORDER — CLOTRIMAZOLE-BETAMETHASONE 1-0.05 % EX CREA
1.0000 "application " | TOPICAL_CREAM | Freq: Every day | CUTANEOUS | 1 refills | Status: DC
  Filled 2021-06-02: qty 60, 25d supply, fill #0
  Filled 2021-06-02: qty 60, 15d supply, fill #0
  Filled 2021-06-24: qty 30, 28d supply, fill #0

## 2021-06-02 MED ORDER — CARVEDILOL 12.5 MG PO TABS
ORAL_TABLET | Freq: Two times a day (BID) | ORAL | 0 refills | Status: DC
  Filled 2021-06-02: qty 180, 90d supply, fill #0

## 2021-06-02 MED ORDER — VICTOZA 18 MG/3ML ~~LOC~~ SOPN
1.8000 mg | PEN_INJECTOR | Freq: Every day | SUBCUTANEOUS | 1 refills | Status: DC
  Filled 2021-06-02: qty 27, 90d supply, fill #0
  Filled 2021-09-16: qty 27, 90d supply, fill #1

## 2021-06-02 MED ORDER — POTASSIUM CHLORIDE CRYS ER 20 MEQ PO TBCR
EXTENDED_RELEASE_TABLET | ORAL | 11 refills | Status: DC
  Filled 2021-06-02: qty 360, 90d supply, fill #0
  Filled 2021-11-07 – 2021-11-17 (×2): qty 360, 90d supply, fill #1

## 2021-06-02 MED ORDER — INSULIN PEN NEEDLE 32G X 4 MM MISC
3 refills | Status: DC
  Filled 2021-06-02: qty 100, 25d supply, fill #0
  Filled 2021-07-22: qty 100, 25d supply, fill #1
  Filled 2021-09-16: qty 100, 25d supply, fill #2
  Filled 2022-01-12: qty 100, 90d supply, fill #0
  Filled 2022-02-10: qty 100, 25d supply, fill #0

## 2021-06-02 MED ORDER — SPIRONOLACTONE 25 MG PO TABS
ORAL_TABLET | Freq: Every day | ORAL | 0 refills | Status: DC
  Filled 2021-06-02: qty 90, 90d supply, fill #0

## 2021-06-02 NOTE — Progress Notes (Signed)
Assessment & Plan:  [name] was seen today for diabetes, hypertension and rash.  Diagnoses and all orders for this visit:  Diabetes mellitus with stage 2 chronic kidney disease (HCC) -     POCT glucose (manual entry) -     POCT glycosylated hemoglobin (Hb A1C) -     liraglutide (VICTOZA) 18 MG/3ML SOPN; Inject 1.8 mg into the skin daily. -     dapagliflozin propanediol (FARXIGA) 10 MG TABS tablet; TAKE 1 TABLET (10 MG TOTAL) BY MOUTH DAILY BEFORE BREAKFAST. -     Insulin Pen Needle 32G X 4 MM MISC; INJECT 1 APPLICATION INTO THE SKIN DAILY. THE PATIENT IS INSULIN REQUIRING, ICD 10 CODE E11.9. THE PATIENT INJECTS 1 TIMES PER DAY. Continue blood sugar control as discussed in office today, low carbohydrate diet, and regular physical exercise as tolerated, 150 minutes per week (30 min each day, 5 days per week, or 50 min 3 days per week). Keep blood sugar logs with fasting goal of 90-130 mg/dl, post prandial (after you eat) less than 180.  For Hypoglycemia: BS <60 and Hyperglycemia BS >400; contact the clinic ASAP. Annual eye exams and foot exams are recommended.   Essential hypertension -     potassium chloride SA (KLOR-CON) 20 MEQ tablet; TAKE 2 TABLETS (40 MEQ TOTAL) BY MOUTH 2 (TWO) TIMES DAILY. -     carvedilol (COREG) 12.5 MG tablet; TAKE 1 TABLET (12.5 MG TOTAL) BY MOUTH 2 (TWO) TIMES DAILY WITH A MEAL. Continue all antihypertensives as prescribed.  Remember to bring in your blood pressure log with you for your follow up appointment.  DASH/Mediterranean Diets are healthier choices for HTN.     Chronic combined systolic and diastolic congestive heart failure (HCC) -     potassium chloride SA (KLOR-CON) 20 MEQ tablet; TAKE 2 TABLETS (40 MEQ TOTAL) BY MOUTH 2 (TWO) TIMES DAILY. -     carvedilol (COREG) 12.5 MG tablet; TAKE 1 TABLET (12.5 MG TOTAL) BY MOUTH 2 (TWO) TIMES DAILY WITH A MEAL. -     spironolactone (ALDACTONE) 25 MG tablet; TAKE 1 TABLET (25 MG TOTAL) BY MOUTH DAILY. DASH  DIET  Dyslipidemia, goal LDL below 70 -     Lipid panel INSTRUCTIONS: Work on a low fat, heart healthy diet and participate in regular aerobic exercise program by working out at least 150 minutes per week; 5 days a week-30 minutes per day. Avoid red meat/beef/steak,  fried foods. junk foods, sodas, sugary drinks, unhealthy snacking, alcohol and smoking.  Drink at least 80 oz of water per day and monitor your carbohydrate intake daily.     Colon cancer screening -     Ambulatory referral to Gastroenterology  Low hematocrit -     CBC  Skin rash -     clotrimazole-betamethasone (LOTRISONE) cream; Apply 1 application topically daily.   Patient has been counseled on age-appropriate routine health concerns for screening and prevention. These are reviewed and up-to-date. Referrals have been placed accordingly. Immunizations are up-to-date or declined.    Subjective:   Chief Complaint  Patient presents with   Diabetes   Hypertension   Rash    [name] 45 y.o. male presents to office today for follow up to his chronic health conditions He is being followed by cardiology for Heart failure PMH: HTN, HPL, Morbid Obesity, Tobacco dependence, cocaine abuse, NICM and chronic systolic heart failure with ICD, medication non adherence.   DM 2 Poorly controlled. Checks his blood glucose levels  once nightly. States average readings 120-140s however A1c is 8.3 today. He ran out of victoza and farxiga and states they were not refilled. Only using lantus 36 units nightly at this time. Will refill farxiga and victoza today. He is aware that these medications should be continued monthly unless otherwise instructed by cardiology or PCP. LDL not at goal with atorvastatin 40 mg daily.  Lab Results  Component Value Date   HGBA1C 8.3 (A) 06/02/2021    Lab Results  Component Value Date   LDLCALC 89 02/29/2020    HTN/CHF Blood pressure is well controlled. Currently prescribed carvedilol 12.5  mg BID, entresto 49-51 mg BID, spironolactone 25 mg daily, torsemide 100 mg daily. He denies chest pain or worsening shortness of breath.  BP Readings from Last 3 Encounters:  06/02/21 102/67  04/24/21 102/60  01/19/21 (!) 88/60    Rash: Patient complains of rash involving the  right axilla . Rash started several days ago.  Discomfort associated with rash: is painful and is pruritic.  Associated symptoms: none.  Patient has not had previous evaluation of rash. Patient has not had previous treatment. Patient has not identified precipitant however he believes it may be due to excessive perspiration with the heat/outside temperatures. Patient has not had new exposures (soaps, lotions, laundry detergents, foods, medications, plants, insects or animals.)    Review of Systems  Constitutional:  Negative for fever, malaise/fatigue and weight loss.  HENT: Negative.  Negative for nosebleeds.   Eyes: Negative.  Negative for blurred vision, double vision and photophobia.  Respiratory: Negative.  Negative for cough and shortness of breath.   Cardiovascular: Negative.  Negative for chest pain, palpitations, leg swelling and PND.  Gastrointestinal: Negative.  Negative for heartburn, nausea and vomiting.  Musculoskeletal: Negative.  Negative for myalgias.  Skin:  Positive for itching and rash.       SEE HPI  Neurological: Negative.  Negative for dizziness, focal weakness, seizures and headaches.  Psychiatric/Behavioral: Negative.  Negative for suicidal ideas.    Past Medical History:  Diagnosis Date   Depression, major, single episode 01/14/2017   Diabetes mellitus [facility])    ED (erectile dysfunction)    History of syncope 01/29/2015   Hypertension    ICD (implantable cardioverter-defibrillator) discharge 11/30/2014   On 11/30/14. Asymptomatic.    Nonischemic cardiomyopathy ([facility])    a.  echo 4/06: EF 30%, mild to mod MR, mild RAE, inf HK, lat HK , ant HK;    b.  cath 4/06: no CAD, EF 20-25%   NSVT  (nonsustained ventricular tachycardia) (HCC)    Obesity    Systolic CHF, chronic ([place])    EF 11/17 25-30%, s/p ICD    Past Surgical History:  Procedure Laterality Date   CARDIAC CATHETERIZATION  09/2011; 02/2013; 04/2013   CARDIAC DEFIBRILLATOR PLACEMENT  08/23/2013   IMPLANTABLE CARDIOVERTER DEFIBRILLATOR IMPLANT N/A 08/23/2013   Procedure: IMPLANTABLE CARDIOVERTER DEFIBRILLATOR IMPLANT;  Surgeon: Deboraha Sprang, MD;  Location: [facility] CATH LAB;  Service: Cardiovascular;  Laterality: N/A;   LEFT AND RIGHT HEART CATHETERIZATION WITH CORONARY ANGIOGRAM N/A 09/20/2011   Procedure: LEFT AND RIGHT HEART CATHETERIZATION WITH CORONARY ANGIOGRAM;  Surgeon: Jolaine Artist, MD;  Location: [facility] CATH LAB;  Service: Cardiovascular;  Laterality: N/A;   MULTIPLE EXTRACTIONS WITH ALVEOLOPLASTY N/A 01/26/2013   Procedure:  EXTRACION  TOOTH # 19 WITH ALVEOLOPLASTY;  Surgeon: Lenn Cal, DDS;  Location: [place];  Service: Oral Surgery;  Laterality: N/A;   RIGHT HEART CATHETERIZATION N/A 02/22/2013  Procedure: RIGHT HEART CATH;  Surgeon: Jolaine Artist, MD;  Location[facility] C[facility] LAB;  Service: Cardiovascular;  Laterality: N/A;   RIGHT HEART CATHETERIZATION N/A 05/03/2013   Procedure: RIGHT HEART CATH;  Surgeon: Jolaine Artist, MD;  Location: [facility] CATH LAB;  Service: Cardiovascular;  Laterality: N/A;    Family History  Problem Relation Age of Onset   Coronary artery disease Mother 58       s/p PCI   Lung cancer Father    Diabetes type II Maternal Uncle    Coronary artery disease Maternal Uncle    Stroke Neg Hx    Heart attack Neg Hx     Social History Reviewed with no changes to be made today.   Outpatient Medications Prior to Visit  Medication Sig Dispense Refill   Accu-Chek Softclix Lancets lancets Check blood sugar twice daily E11.8 100 each 2   acetaminophen (TYLENOL) 500 MG tablet Take 1 tablet (500 mg total) by mouth every 6 (six) hours as needed. 30 tablet 0   aspirin EC 81 MG EC tablet Take  1 tablet (81 mg total) by mouth daily. 30 tablet 0   atorvastatin (LIPITOR) 40 MG tablet TAKE 1 TABLET (40 MG TOTAL) BY MOUTH DAILY. 30 tablet 0   Blood Glucose Calibration (ACCU-CHEK GUIDE CONTROL) LIQD 1 each by In Vitro route once as needed for up to 1 dose. 1 each 0   Blood Glucose Monitoring Suppl (ACCU-CHEK GUIDE ME) w/Device KIT Check blood sugar twice daily E11.8 1 kit 0   Blood Pressure Monitor DEVI Please provide patient with insurance approved blood pressure monitor 1 Device 0   fluticasone (FLONASE) 50 MCG/ACT nasal spray Place 2 sprays into both nostrils daily. 16 g 6   glucose blood (ACCU-CHEK GUIDE) test strip Check blood sugar twice daily E11.8 100 each 2   insulin glargine (LANTUS SOLOSTAR) 100 UNIT/ML Solostar Pen INJECT 36 UNITS INTO THE SKIN DAILY AT 10 PM. 15 mL 0   sacubitril-valsartan (ENTRESTO) 49-51 MG TAKE 1 TABLET BY MOUTH 2 (TWO) TIMES DAILY. 60 tablet 11   torsemide (DEMADEX) 100 MG tablet TAKE 1 TABLET (100 MG TOTAL) BY MOUTH EVERY MORNING. MAY ALSO TAKE 0.5 TABLETS (50 MG TOTAL) AT BEDTIME AS NEEDED. 90 tablet 3   carvedilol (COREG) 12.5 MG tablet TAKE 1 TABLET (12.5 MG TOTAL) BY MOUTH 2 (TWO) TIMES DAILY WITH A MEAL. 180 tablet 3   dapagliflozin propanediol (FARXIGA) 10 MG TABS tablet TAKE 1 TABLET (10 MG TOTAL) BY MOUTH DAILY BEFORE BREAKFAST. 90 tablet 3   Insulin Pen Needle 32G X 4 MM MISC INJECT 1 APPLICATION INTO THE SKIN DAILY. THE PATIENT IS INSULIN REQUIRING, ICD 10 CODE E11.9. THE PATIENT INJECTS 1 TIMES PER DAY. 100 each 3   potassium chloride SA (KLOR-CON) 20 MEQ tablet TAKE 2 TABLETS (40 MEQ TOTAL) BY MOUTH 2 (TWO) TIMES DAILY. 120 tablet 11   spironolactone (ALDACTONE) 25 MG tablet TAKE 1 TABLET (25 MG TOTAL) BY MOUTH DAILY. 30 tablet 11   ibuprofen (ADVIL,MOTRIN) 600 MG tablet Take 1 tablet (600 mg total) by mouth every 6 (six) hours as needed. (Patient not taking: Reported on 06/02/2021) 30 tablet 0   sildenafil (VIAGRA) 100 MG tablet Take 1 tablet (100  mg total) by mouth daily as needed for erectile dysfunction. (Patient not taking: Reported on 06/02/2021) 10 tablet 0   No facility-administered medications prior to visit.    Allergies  Allergen Reactions   Bydureon [Exenatide] Rash   Penicillins Other (  See Comments)    Unknown childhood reaction        Objective:    BP 102/67 (BP Location: Right Arm, Patient Position: Sitting, Cuff Size: Large)   Pulse 89   Resp 16   Wt (!) 322 lb 9.6 oz (146.3 kg)   SpO2 97%   BMI 39.27 kg/m  Wt Readings from Last 3 Encounters:  06/02/21 (!) 322 lb 9.6 oz (146.3 kg)  04/24/21 (!) 319 lb 12.8 oz (145.1 kg)  01/19/21 (!) 326 lb 12.8 oz (148.2 kg)    Physical Exam Vitals and nursing note reviewed.  Constitutional:      Appearance: He is well-developed.  HENT:     Head: Normocephalic and atraumatic.  Cardiovascular:     Rate and Rhythm: Normal rate and regular rhythm.     Heart sounds: Normal heart sounds. No murmur heard.   No friction rub. No gallop.  Pulmonary:     Effort: Pulmonary effort is normal. No tachypnea or respiratory distress.     Breath sounds: Normal breath sounds. No decreased breath sounds, wheezing, rhonchi or rales.  Chest:     Chest wall: No tenderness.  Abdominal:     General: Bowel sounds are normal.     Palpations: Abdomen is soft.  Musculoskeletal:        General: Normal range of motion.     Cervical back: Normal range of motion.  Skin:    General: Skin is warm and dry.     Findings: Rash present. No abrasion, abscess, ecchymosis, erythema, signs of injury or lesion. Rash is macular.     Comments: Candidial appearing rash in right axilla  Neurological:     Mental Status: He is alert and oriented to person, place, and time.     Coordination: Coordination normal.  Psychiatric:        Behavior: Behavior normal. Behavior is cooperative.        Thought Content: Thought content normal.        Judgment: Judgment normal.         Patient has been  counseled extensively about nutrition and exercise as well as the importance of adherence with medications and regular follow-up. The patient was given clear instructions to go to ER or return to medical center if symptoms don't improve, worsen or new problems develop. The patient verbalized understanding.   Follow-up: Return in about 3 months (around 09/02/2021) for [place] 4 weeks.SEE ME IN 3 months.   Gildardo Pounds, FNP-BC [facility] [place], [place]   06/02/2021, 1:07 PM

## 2021-06-03 ENCOUNTER — Other Ambulatory Visit: Payer: Self-pay

## 2021-06-03 ENCOUNTER — Other Ambulatory Visit: Payer: Self-pay | Admitting: Nurse Practitioner

## 2021-06-03 LAB — CBC
Hematocrit: 44.4 % (ref 37.5–51.0)
Hemoglobin: 15.3 g/dL (ref 13.0–17.7)
MCH: 28.8 pg (ref 26.6–33.0)
MCHC: 34.5 g/dL (ref 31.5–35.7)
MCV: 84 fL (ref 79–97)
Platelets: 247 10*3/uL (ref 150–450)
RBC: 5.31 x10E6/uL (ref 4.14–5.80)
RDW: 13.1 % (ref 11.6–15.4)
WBC: 8.8 10*3/uL (ref 3.4–10.8)

## 2021-06-03 LAB — LIPID PANEL
Chol/HDL Ratio: 6.9 ratio — ABNORMAL HIGH (ref 0.0–5.0)
Cholesterol, Total: 228 mg/dL — ABNORMAL HIGH (ref 100–199)
HDL: 33 mg/dL — ABNORMAL LOW (ref >39)
LDL Chol Calc (NIH): 157 mg/dL — ABNORMAL HIGH (ref 0–99)
Triglycerides: 203 mg/dL — ABNORMAL HIGH (ref 0–149)
VLDL Cholesterol Cal: 38 mg/dL (ref 5–40)

## 2021-06-05 ENCOUNTER — Other Ambulatory Visit: Payer: Self-pay

## 2021-06-09 ENCOUNTER — Other Ambulatory Visit: Payer: Self-pay

## 2021-06-09 ENCOUNTER — Other Ambulatory Visit (HOSPITAL_COMMUNITY): Payer: Self-pay | Admitting: Internal Medicine

## 2021-06-09 MED ORDER — ISOSORB DINITRATE-HYDRALAZINE 20-37.5 MG PO TABS
1.0000 | ORAL_TABLET | Freq: Two times a day (BID) | ORAL | 3 refills | Status: DC
  Filled 2021-06-09 – 2021-06-10 (×2): qty 180, 90d supply, fill #0
  Filled 2021-11-07: qty 180, 90d supply, fill #1

## 2021-06-09 MED FILL — Torsemide Tab 100 MG: ORAL | 34 days supply | Qty: 51 | Fill #2 | Status: AC

## 2021-06-09 NOTE — Telephone Encounter (Signed)
Patient takes this medication 1 tablet twice a day.

## 2021-06-10 ENCOUNTER — Other Ambulatory Visit: Payer: Self-pay

## 2021-06-11 ENCOUNTER — Other Ambulatory Visit: Payer: Self-pay

## 2021-06-11 ENCOUNTER — Other Ambulatory Visit: Payer: Self-pay | Admitting: Family Medicine

## 2021-06-11 DIAGNOSIS — N182 Chronic kidney disease, stage 2 (mild): Secondary | ICD-10-CM

## 2021-06-11 DIAGNOSIS — E1122 Type 2 diabetes mellitus with diabetic chronic kidney disease: Secondary | ICD-10-CM

## 2021-06-11 MED ORDER — LANTUS SOLOSTAR 100 UNIT/ML ~~LOC~~ SOPN
PEN_INJECTOR | SUBCUTANEOUS | 0 refills | Status: DC
  Filled 2021-06-11 – 2021-06-24 (×2): qty 15, 41d supply, fill #0

## 2021-06-18 ENCOUNTER — Other Ambulatory Visit: Payer: Self-pay

## 2021-06-24 ENCOUNTER — Other Ambulatory Visit: Payer: Self-pay

## 2021-06-26 ENCOUNTER — Other Ambulatory Visit: Payer: Self-pay

## 2021-06-26 ENCOUNTER — Encounter (HOSPITAL_COMMUNITY): Payer: Self-pay | Admitting: Emergency Medicine

## 2021-06-26 ENCOUNTER — Ambulatory Visit (HOSPITAL_COMMUNITY)
Admission: EM | Admit: 2021-06-26 | Discharge: 2021-06-26 | Disposition: A | Discharge status: Home / Self Care | Payer: Medicaid Other | Attending: Student | Admitting: Student

## 2021-06-26 DIAGNOSIS — E860 Dehydration: Secondary | ICD-10-CM | POA: Diagnosis not present

## 2021-06-26 DIAGNOSIS — E1169 Type 2 diabetes mellitus with other specified complication: Secondary | ICD-10-CM

## 2021-06-26 DIAGNOSIS — I509 Heart failure, unspecified: Secondary | ICD-10-CM

## 2021-06-26 DIAGNOSIS — Z794 Long term (current) use of insulin: Secondary | ICD-10-CM

## 2021-06-26 DIAGNOSIS — E1159 Type 2 diabetes mellitus with other circulatory complications: Secondary | ICD-10-CM

## 2021-06-26 LAB — POCT URINALYSIS DIPSTICK, ED / UC
Bilirubin Urine: NEGATIVE
Glucose, UA: 250 mg/dL — AB
Ketones, ur: NEGATIVE mg/dL
Leukocytes,Ua: NEGATIVE
Nitrite: NEGATIVE
Protein, ur: NEGATIVE mg/dL
Specific Gravity, Urine: <=1.005 (ref 1.005–1.030)
Urobilinogen, UA: 0.2 mg/dL (ref 0.0–1.0)
pH: 6 (ref 5.0–8.0)

## 2021-06-26 LAB — CBG MONITORING, ED: Glucose-Capillary: 84 mg/dL (ref 70–99)

## 2021-06-26 NOTE — ED Notes (Signed)
Patient gave urine sample. Drinking second cup of water at this time.

## 2021-06-26 NOTE — ED Triage Notes (Signed)
Alen Blew, PA notified about patients BP. Pt sent back to lobby with cup of water.

## 2021-06-26 NOTE — ED Triage Notes (Signed)
Pt presents with hypotension. States started feeling dizzy at work this am and BP read 94/50 and 101/51.

## 2021-06-26 NOTE — Discharge Instructions (Addendum)
-  Continue drinking plenty of water -Seek additional medical attention if new symptoms like chest pain, shortness of breath, dizziness returns, etc.

## 2021-06-26 NOTE — ED Provider Notes (Signed)
Douglas [name]    CSN: [id number] Arrival date & time: 06/26/21  1135      History   Chief Complaint Chief Complaint  Patient presents with   Hypotension    [name] is a 45 y.o. male presenting with dehydration.  Medical history obesity, diabetes, hypertension, CHF.  States that he has a busy job and works in the heat, he did not drink any water yesterday and so this morning when he went to work he began to feel lightheaded.  He sat down and this passed after about 5 minutes.  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  Denies absolutely any dizziness at the time of this visit, denies chest pain, shortness of breath, weakness.  Denies pedal edema, states that his home weights are stable.  He is taking his medications as directed.  HPI  Past Medical History:  Diagnosis Date   Depression, major, single episode 01/14/2017   Diabetes mellitus [facility])    ED (erectile dysfunction)    History of syncope 01/29/2015   Hypertension    ICD (implantable cardioverter-defibrillator) discharge 11/30/2014   On 11/30/14. Asymptomatic.    Nonischemic cardiomyopathy ([place])    a.  echo 4/06: EF 30%, mild to mod MR, mild RAE, inf HK, lat HK , ant HK;    b.  cath 4/06: no CAD, EF 20-25%   NSVT (nonsustained ventricular tachycardia) (HCC)    Obesity    Systolic CHF, chronic ([place])    EF 11/17 25-30%, s/p ICD    Patient Active Problem List   Diagnosis Date Noted   Onychomycosis 01/16/2017   Depression, major, single episode 01/14/2017   Allergic rhinitis 04/28/2015   Chest pain 04/21/2015   Prolonged Q-T interval on ECG 01/31/2015   ICD (MDT) in place 01/29/2015   Obesity 01/29/2015   Healthcare maintenance 12/11/2014   Cocaine use 06/23/2012   Tobacco use 09/20/2011   VT (ventricular tachycardia) ([place]) 09/19/2011   ED (erectile dysfunction) 08/02/2011   Chronic combined systolic and diastolic congestive heart failure ([place])  06/21/2011   Diabetes mellitus with stage 2 chronic kidney disease ([place]) 04/28/2009   Hyperlipidemia 04/28/2009   Essential hypertension 04/28/2009    Past Surgical History:  Procedure Laterality Date   CARDIAC CATHETERIZATION  09/2011; 02/2013; 04/2013   CARDIAC DEFIBRILLATOR PLACEMENT  08/23/2013   IMPLANTABLE CARDIOVERTER DEFIBRILLATOR IMPLANT N/A 08/23/2013   Procedure: IMPLANTABLE CARDIOVERTER DEFIBRILLATOR IMPLANT;  Surgeon: Deboraha Sprang, MD;  Location: [facility] CATH LAB;  Service: Cardiovascular;  Laterality: N/A;   LEFT AND RIGHT HEART CATHETERIZATION WITH CORONARY ANGIOGRAM N/A 09/20/2011   Procedure: LEFT AND RIGHT HEART CATHETERIZATION WITH CORONARY ANGIOGRAM;  Surgeon: Jolaine Artist, MD;  Location: [facility] CATH LAB;  Service: Cardiovascular;  Laterality: N/A;   MULTIPLE EXTRACTIONS WITH ALVEOLOPLASTY N/A 01/26/2013   Procedure:  EXTRACION  TOOTH # 19 WITH ALVEOLOPLASTY;  Surgeon: Lenn Cal, DDS;  Location: [place];  Service: Oral Surgery;  Laterality: N/A;   RIGHT HEART CATHETERIZATION N/A 02/22/2013   Procedure: RIGHT HEART CATH;  Surgeon: Jolaine Artist, MD;  Location: [facility] CATH LAB;  Service: Cardiovascular;  Laterality: N/A;   RIGHT HEART CATHETERIZATION N/A 05/03/2013   Procedure: RIGHT HEART CATH;  Surgeon: Jolaine Artist, MD;  Location: [facility] CATH LAB;  Service: Cardiovascular;  Laterality: N/A;       Home Medications    Prior to Admission medications   Medication Sig Start Date End  Date Taking? Authorizing Provider  Accu-Chek Softclix Lancets lancets Check blood sugar twice daily E11.8 04/03/21   Charlott Rakes, MD  acetaminophen (TYLENOL) 500 MG tablet Take 1 tablet (500 mg total) by mouth every 6 (six) hours as needed. 04/29/21   Gildardo Pounds, NP  aspirin EC 81 MG EC tablet Take 1 tablet (81 mg total) by mouth daily. 04/23/15   Kelby Aline, MD  atorvastatin (LIPITOR) 40 MG tablet TAKE 1 TABLET (40 MG TOTAL) BY MOUTH DAILY. 05/01/21 05/01/22  Charlott Rakes, MD   Blood Glucose Calibration (ACCU-CHEK GUIDE CONTROL) LIQD 1 each by In Vitro route once as needed for up to 1 dose. 03/30/19   Gildardo Pounds, NP  Blood Glucose Monitoring Suppl (ACCU-CHEK GUIDE ME) w/Device KIT Check blood sugar twice daily E11.8 04/03/21   Charlott Rakes, MD  Blood Pressure Monitor DEVI Please provide patient with insurance approved blood pressure monitor 06/09/19   Gildardo Pounds, NP  carvedilol (COREG) 12.5 MG tablet TAKE 1 TABLET (12.5 MG TOTAL) BY MOUTH 2 (TWO) TIMES DAILY WITH A MEAL. 06/02/21 08/31/21  Gildardo Pounds, NP  clotrimazole-betamethasone (LOTRISONE) cream Apply 1 application topically daily. 06/02/21   Gildardo Pounds, NP  dapagliflozin propanediol (FARXIGA) 10 MG TABS tablet TAKE 1 TABLET (10 MG TOTAL) BY MOUTH DAILY BEFORE BREAKFAST. 06/02/21 06/02/22  Gildardo Pounds, NP  fluticasone (FLONASE) 50 MCG/ACT nasal spray Place 2 sprays into both nostrils daily. 04/29/21   Gildardo Pounds, NP  glucose blood (ACCU-CHEK GUIDE) test strip Check blood sugar twice daily E11.8 04/03/21   Charlott Rakes, MD  insulin glargine (LANTUS SOLOSTAR) 100 UNIT/ML Solostar Pen INJECT 36 UNITS INTO THE SKIN DAILY AT 10 PM. 06/11/21 06/11/22  Charlott Rakes, MD  Insulin Pen Needle 32G X 4 MM MISC INJECT 1 APPLICATION INTO THE SKIN DAILY. THE PATIENT IS INSULIN REQUIRING, ICD 10 CODE E11.9. THE PATIENT INJECTS 1 TIMES PER DAY. 06/02/21 06/02/22  Gildardo Pounds, NP  isosorbide-hydrALAZINE (BIDIL) 20-37.5 MG tablet Take 1 tablet by mouth 2 (two) times daily. 06/09/21   Bensimhon, Shaune Pascal, MD  liraglutide (VICTOZA) 18 MG/3ML SOPN Inject 1.8 mg into the skin daily. 06/02/21 08/31/21  Gildardo Pounds, NP  potassium chloride SA (KLOR-CON) 20 MEQ tablet TAKE 2 TABLETS (40 MEQ TOTAL) BY MOUTH 2 (TWO) TIMES DAILY. 06/02/21 06/02/22  Gildardo Pounds, NP  sacubitril-valsartan (ENTRESTO) 49-51 MG TAKE 1 TABLET BY MOUTH 2 (TWO) TIMES DAILY. 02/26/21 02/26/22  Clegg, Amy D, NP  sildenafil (VIAGRA) 100 MG  tablet Take 1 tablet (100 mg total) by mouth daily as needed for erectile dysfunction. Patient not taking: Reported on 06/02/2021 04/24/21   Bensimhon, Shaune Pascal, MD  spironolactone (ALDACTONE) 25 MG tablet TAKE 1 TABLET (25 MG TOTAL) BY MOUTH DAILY. 06/02/21 08/31/21  Gildardo Pounds, NP  torsemide (DEMADEX) 100 MG tablet TAKE 1 TABLET (100 MG TOTAL) BY MOUTH EVERY MORNING. MAY ALSO TAKE 0.5 TABLETS (50 MG TOTAL) AT BEDTIME AS NEEDED. 01/19/21 01/19/22  Rafael Bihari, FNP    Family History Family History  Problem Relation Age of Onset   Coronary artery disease Mother 38       s/p PCI   Lung cancer Father    Diabetes type II Maternal Uncle    Coronary artery disease Maternal Uncle    Stroke Neg Hx    Heart attack Neg Hx     Social History Social History   Tobacco Use   Smoking status: Every Day  Packs/day: 0.25    Years: 8.00    Pack years: 2.00    Types: Cigarettes   Smokeless tobacco: Never   Tobacco comments:    ~3 cigarettes a day  Vaping Use   Vaping Use: Never used  Substance Use Topics   Alcohol use: Yes    Alcohol/week: 0.0 standard drinks    Comment: 2x week.    Drug use: Not Currently    Types: Cocaine    Comment: 08/10/17 - last use one week ago     Allergies   Bydureon [exenatide] and Penicillins   Review of Systems Review of Systems  Neurological:  Positive for light-headedness. Negative for dizziness, tremors, seizures, syncope, facial asymmetry, speech difficulty, weakness, numbness and headaches.  All other systems reviewed and are negative.   Physical Exam Triage Vital Signs ED Triage Vitals  Enc Vitals Group     BP 06/26/21 1214 (!) 103/48     Pulse Rate 06/26/21 1214 72     Resp 06/26/21 1214 16     Temp --      Temp src --      SpO2 06/26/21 1214 99 %     Weight --      Height --      Head Circumference --      Peak Flow --      Pain Score 06/26/21 1212 0     Pain Loc --      Pain Edu? --      Excl. in [place]? --    No data  found.  Updated Vital Signs BP (!) 112/54   Pulse 68   Resp 16   SpO2 100%   Visual Acuity Right Eye Distance:   Left Eye Distance:   Bilateral Distance:    Right Eye Near:   Left Eye Near:    Bilateral Near:     Physical Exam Vitals reviewed.  Constitutional:      General: He is not in acute distress.    Appearance: Normal appearance. He is obese. He is not ill-appearing or diaphoretic.  HENT:     Head: Normocephalic and atraumatic.     Mouth/Throat:     Mouth: Mucous membranes are moist.  Eyes:     Extraocular Movements: Extraocular movements intact.     Pupils: Pupils are equal, round, and reactive to light.  Cardiovascular:     Rate and Rhythm: Normal rate and regular rhythm.     Pulses:          Radial pulses are 2+ on the right side and 2+ on the left side.     Heart sounds: Normal heart sounds.  Pulmonary:     Effort: Pulmonary effort is normal.     Breath sounds: Normal breath sounds.  Abdominal:     Palpations: Abdomen is soft.     Tenderness: There is no abdominal tenderness. There is no guarding or rebound.  Musculoskeletal:     Right lower leg: No edema.     Left lower leg: No edema.  Skin:    General: Skin is warm.     Capillary Refill: Capillary refill takes less than 2 seconds.  Neurological:     General: No focal deficit present.     Mental Status: He is alert and oriented to person, place, and time.     Comments: CN 2-12 grossly intact, PERRLA, EOMI. Negative rhomberg, pronator drift.  Psychiatric:        Mood and Affect: Mood normal.  Behavior: Behavior normal.        Thought Content: Thought content normal.        Judgment: Judgment normal.     UC Treatments / Results  Labs (all labs ordered are listed, but only abnormal results are displayed) Labs Reviewed  POCT URINALYSIS DIPSTICK, ED / UC - Abnormal; Notable for the following components:      Result Value   Glucose, UA 250 (*)    Hgb urine dipstick TRACE (*)    All other  components within normal limits  CBG MONITORING, ED    EKG   Radiology No results found.  Procedures Procedures (including critical care time)  Medications Ordered in UC Medications - No data to display  Initial Impression / Assessment and Plan / UC Course  I have reviewed the triage vital signs and the nursing notes.  Pertinent labs & imaging results that were available during my care of the patient were reviewed by me and considered in my medical decision making (see chart for details).     This patient is a very pleasant 45 y.o. year old male presenting with dehydration due to working in heat and not hydrating sufficiently. Some lightheadedness this morning but none currently.   EKG NSR (compared with 2020 EKG) Nonfasting CBG 84 BP initially 103/48, following oral rehydration 112/54, which is normal range for him per chart review. Neuro exam benign. Pt is asymptomatic through entire visit. Does endorse 5 minutes of lightheadedness few hours ago that has completely resolved. No chest pain, SOB.   Continue good hydration.  CHF- continue current regimen. No recent weight gain. Diabetes- continue current regimen.  ED return precautions discussed. Patient verbalizes understanding and agreement.    Final Clinical Impressions(s) / UC Diagnoses   Final diagnoses:  Dehydration  Congestive heart failure, unspecified HF chronicity, unspecified heart failure type ([place])  Type 2 diabetes mellitus with other specified complication, with long-term current use of insulin ([place])     Discharge Instructions      -Continue drinking plenty of water -Seek additional medical attention if new symptoms like chest pain, shortness of breath, dizziness returns, etc.     ED Prescriptions   None    PDMP not reviewed this encounter.   Hazel Sams, PA-C 06/26/21 1323

## 2021-06-30 ENCOUNTER — Encounter: Payer: Self-pay | Admitting: Pharmacist

## 2021-06-30 ENCOUNTER — Ambulatory Visit: Payer: Medicaid Other | Attending: Nurse Practitioner | Admitting: Pharmacist

## 2021-06-30 ENCOUNTER — Other Ambulatory Visit: Payer: Self-pay

## 2021-06-30 DIAGNOSIS — E1122 Type 2 diabetes mellitus with diabetic chronic kidney disease: Secondary | ICD-10-CM | POA: Diagnosis not present

## 2021-06-30 DIAGNOSIS — N182 Chronic kidney disease, stage 2 (mild): Secondary | ICD-10-CM | POA: Diagnosis not present

## 2021-06-30 LAB — GLUCOSE, POCT (MANUAL RESULT ENTRY): POC Glucose: 115 mg/dl — AB (ref 70–99)

## 2021-06-30 NOTE — Progress Notes (Signed)
S:    PCP: Zelda   No chief complaint on file.  Patient arrives in good spirits.  Presents for diabetes evaluation, education, and management. Patient was referred and last seen by Primary Care Provider on 06/02/2021. At that visit, refills were given as pt had reported being without medications prior to that appointment.  No medications changes were made.   Today, patient reports diabetes was diagnosed around 5-6 years ago. He tells me he has tried metformin before but stopped this d/t "feeling funny" while taking. He denies any hx of DKA, pancreatitis. Denies any history of thyroid cancer. He does have NICM (S/p ICD implant) and CKD II but denies any hx of CVA or ACS.  Family/Social History:  - Fhx: CAD, DM - Tobacco: current every day smoker (0.25 PPD) - Alcohol: denies use   Insurance coverage/medication affordability: [place] Medicaid   Medication adherence reported.   Current diabetes medications include: Farxiga 10 mg daily, Lantus 36 units, Victoza 1.8 mg daily  Current hypertension medications include: Entresto 49-51 mg BID, carvedilol 12.5 mg BID, BIDil 20-37.5 mg BID, spironolactone 100 mg daily **Torsemide for fluid control Current hyperlipidemia medications include: atorvastatin 40 mg daily   Patient denies hypoglycemic events.  Patient reported dietary habits:  - Does try to limit sweets and carbs  - Does admit to drinking sweetened fruit juices and is trying to change this  Patient-reported exercise habits:  - None outside of work. Active at work.    Patient denies nocturia (nighttime urination).  Patient reports neuropathy (nerve pain). Patient denies visual changes. Patient reports self foot exams.     O:  POCT: 115  Lab Results  Component Value Date   HGBA1C 8.3 (A) 06/02/2021   There were no vitals filed for this visit.  Lipid Panel     Component Value Date/Time   CHOL 228 (H) 06/02/2021 1110   TRIG 203 (H) 06/02/2021 1110   HDL 33 (L) 06/02/2021  1110   CHOLHDL 6.9 (H) 06/02/2021 1110   CHOLHDL 5.2 12/10/2014 1619   VLDL 36 12/10/2014 1619   LDLCALC 157 (H) 06/02/2021 1110   Brings home meter and averages are as follows: Averages:  7-day: 128 14-day: 130 30-day: 141  Of note, no values <70 and highest value is 200 since seeing PCP.   Clinical Atherosclerotic Cardiovascular Disease (ASCVD): No  The 10-year ASCVD risk score Denman George DC Jr., et al., 2013) is: 20.2%   Values used to calculate the score:     Age: 45 years     Sex: Male     Is Non-Hispanic African American: Yes     Diabetic: Yes     Tobacco smoker: Yes     Systolic Blood Pressure: 112 mmHg     Is BP treated: Yes     HDL Cholesterol: 33 mg/dL     Total Cholesterol: 228 mg/dL   A/P: Diabetes longstanding currently above goal based on recent A1c. Home CBG averages reveal improved control since restarting [place] and Victoza. Patient is able to verbalize appropriate hypoglycemia management plan. Medication adherence appears appropriate. -Continued current regimen.  -Emphasized diet. Pt will work to eliminate regular fruit juices.  -Extensively discussed pathophysiology of diabetes, recommended lifestyle interventions, dietary effects on blood sugar control -Counseled on s/sx of and management of hypoglycemia -Next A1C anticipated 08/2021.   ASCVD risk - primary prevention in patient with diabetes. Last LDL is not controlled. ASCVD risk score is >20%  - high intensity statin indicated. Can  consider additional therapy to achieve LDL target. Priority given to DM today. Will discuss at f/u visit.  -Continued atorvastatin 40 mg.   Written patient instructions provided.  Total time in face to face counseling 30 minutes.   Follow up Pharmacist Clinic Visit in 1 month.   Butch Penny, PharmD, P[place]y [place], CPP Clinical Pharmacist [facility] [phone]

## 2021-07-08 ENCOUNTER — Other Ambulatory Visit: Payer: Self-pay | Admitting: Family Medicine

## 2021-07-08 ENCOUNTER — Other Ambulatory Visit: Payer: Self-pay

## 2021-07-08 DIAGNOSIS — E782 Mixed hyperlipidemia: Secondary | ICD-10-CM

## 2021-07-08 MED FILL — Torsemide Tab 100 MG: ORAL | 34 days supply | Qty: 51 | Fill #3 | Status: CN

## 2021-07-08 NOTE — Telephone Encounter (Signed)
Requested medication (s) are due for refill today: yes  Requested medication (s) are on the active medication list: yes  Last refill:  05/01/21-05/01/22 #30 0 refills  Future visit scheduled: yes in 1 month  Notes to clinic:  [id number]     Requested Prescriptions  Pending Prescriptions Disp Refills   atorvastatin (LIPITOR) 40 MG tablet 30 tablet 0    Sig: TAKE 1 TABLET (40 MG TOTAL) BY MOUTH DAILY.     Cardiovascular:  Antilipid - Statins Failed - 07/08/2021  4:38 PM      Failed - Total Cholesterol in normal range and within 360 days    Cholesterol, Total  Date Value Ref Range Status  06/02/2021 228 (H) 100 - 199 mg/dL Final          Failed - LDL in normal range and within 360 days    LDL Chol Calc (NIH)  Date Value Ref Range Status  06/02/2021 157 (H) 0 - 99 mg/dL Final          Failed - HDL in normal range and within 360 days    HDL  Date Value Ref Range Status  06/02/2021 33 (L) >39 mg/dL Final          Failed - Triglycerides in normal range and within 360 days    Triglycerides  Date Value Ref Range Status  06/02/2021 203 (H) 0 - 149 mg/dL Final          Passed - Patient is not pregnant      Passed - Valid encounter within last 12 months    Recent Outpatient Visits           1 week ago Diabetes mellitus with stage 2 chronic kidney disease (HCC)   [facility] [place], Stephen L, RPH-CPP   1 month ago Diabetes mellitus with stage 2 chronic kidney disease (HCC)   [facility] [place], Shea Stakes, NP   2 months ago COVID-19   [facility] [place], [place] W, NP   6 months ago Diabetes mellitus with stage 2 chronic kidney disease [facility])   [facility] [place], Shea Stakes, NP   11 months ago Essential hypertension   [facility] [place], Shea Stakes, NP       Future Appointments             In 3 weeks Drucilla Chalet, RPH-CPP [facility]   In 1 month Claiborne Rigg, NP [facility]

## 2021-07-09 ENCOUNTER — Other Ambulatory Visit: Payer: Self-pay

## 2021-07-09 MED ORDER — ATORVASTATIN CALCIUM 40 MG PO TABS
ORAL_TABLET | Freq: Every day | ORAL | 0 refills | Status: DC
  Filled 2021-07-09 – 2021-07-22 (×2): qty 30, 30d supply, fill #0

## 2021-07-16 ENCOUNTER — Other Ambulatory Visit: Payer: Self-pay

## 2021-07-22 ENCOUNTER — Other Ambulatory Visit: Payer: Self-pay

## 2021-07-22 ENCOUNTER — Other Ambulatory Visit: Payer: Self-pay | Admitting: Family Medicine

## 2021-07-22 DIAGNOSIS — E1122 Type 2 diabetes mellitus with diabetic chronic kidney disease: Secondary | ICD-10-CM

## 2021-07-22 DIAGNOSIS — N182 Chronic kidney disease, stage 2 (mild): Secondary | ICD-10-CM

## 2021-07-22 MED ORDER — LANTUS SOLOSTAR 100 UNIT/ML ~~LOC~~ SOPN
PEN_INJECTOR | SUBCUTANEOUS | 0 refills | Status: DC
  Filled 2021-07-22: qty 15, fill #0
  Filled 2021-08-06: qty 15, 41d supply, fill #0

## 2021-07-22 MED FILL — Torsemide Tab 100 MG: ORAL | 34 days supply | Qty: 51 | Fill #3 | Status: AC

## 2021-07-22 NOTE — Telephone Encounter (Signed)
Requested Prescriptions  Pending Prescriptions Disp Refills  . insulin glargine (LANTUS SOLOSTAR) 100 UNIT/ML Solostar Pen 15 mL 0    Sig: INJECT 36 UNITS INTO THE SKIN DAILY AT 10 PM.     Endocrinology:  Diabetes - Insulins Failed - 07/22/2021 11:25 AM      Failed - HBA1C is between 0 and 7.9 and within 180 days    HbA1c, POC (controlled diabetic range)  Date Value Ref Range Status  06/02/2021 8.3 (A) 0.0 - 7.0 % Final         Passed - Valid encounter within last 6 months    Recent Outpatient Visits          3 weeks ago Diabetes mellitus with stage 2 chronic kidney disease (HCC)   [facility] [place], Jeannett Senior L, RPH-CPP   1 month ago Diabetes mellitus with stage 2 chronic kidney disease (HCC)   [facility] [place], Shea Stakes, NP   2 months ago COVID-19   [facility] [place], [place] W, NP   7 months ago Diabetes mellitus with stage 2 chronic kidney disease [facility])   [facility] [place], Shea Stakes, NP   1 year ago Essential hypertension   [facility] [place], Shea Stakes, NP      Future Appointments            In 1 week Lois Huxley, Cornelius Moras, RPH-CPP [facility]   In 1 month Claiborne Rigg, NP [facility]

## 2021-07-23 ENCOUNTER — Other Ambulatory Visit: Payer: Self-pay

## 2021-08-04 ENCOUNTER — Ambulatory Visit: Payer: Medicaid Other | Admitting: Pharmacist

## 2021-08-04 ENCOUNTER — Ambulatory Visit (INDEPENDENT_AMBULATORY_CARE_PROVIDER_SITE_OTHER): Payer: Medicaid Other

## 2021-08-04 DIAGNOSIS — I428 Other cardiomyopathies: Secondary | ICD-10-CM

## 2021-08-06 ENCOUNTER — Other Ambulatory Visit: Payer: Self-pay

## 2021-08-06 LAB — CUP PACEART REMOTE DEVICE CHECK
Battery Remaining Longevity: 37 mo
Battery Voltage: 2.95 V
Brady Statistic RV Percent Paced: 0.03 %
Date Time Interrogation Session: 20220921120202
HighPow Impedance: 52 Ohm
HighPow Impedance: 66 Ohm
Implantable Lead Implant Date: 20141009
Implantable Lead Location: 753860
Implantable Lead Model: 7121
Implantable Pulse Generator Implant Date: 20141009
Lead Channel Impedance Value: 323 Ohm
Lead Channel Impedance Value: 399 Ohm
Lead Channel Pacing Threshold Amplitude: 0.75 V
Lead Channel Pacing Threshold Pulse Width: 0.4 ms
Lead Channel Sensing Intrinsic Amplitude: 10.25 mV
Lead Channel Sensing Intrinsic Amplitude: 10.25 mV
Lead Channel Setting Pacing Amplitude: 2.5 V
Lead Channel Setting Pacing Pulse Width: 0.4 ms
Lead Channel Setting Sensing Sensitivity: 0.45 mV

## 2021-08-07 ENCOUNTER — Other Ambulatory Visit: Payer: Self-pay

## 2021-08-11 NOTE — Progress Notes (Signed)
Remote ICD transmission.   

## 2021-08-14 ENCOUNTER — Other Ambulatory Visit: Payer: Self-pay

## 2021-08-24 ENCOUNTER — Other Ambulatory Visit: Payer: Self-pay

## 2021-08-24 ENCOUNTER — Other Ambulatory Visit: Payer: Self-pay | Admitting: Family Medicine

## 2021-08-24 DIAGNOSIS — E782 Mixed hyperlipidemia: Secondary | ICD-10-CM

## 2021-08-24 MED FILL — Torsemide Tab 100 MG: ORAL | 34 days supply | Qty: 51 | Fill #4 | Status: AC

## 2021-08-25 ENCOUNTER — Other Ambulatory Visit: Payer: Self-pay

## 2021-08-25 MED ORDER — ATORVASTATIN CALCIUM 40 MG PO TABS
ORAL_TABLET | Freq: Every day | ORAL | 2 refills | Status: DC
  Filled 2021-08-25: qty 30, 30d supply, fill #0
  Filled 2021-10-16 – 2021-11-17 (×3): qty 30, 30d supply, fill #1
  Filled 2022-01-08: qty 30, 30d supply, fill #0

## 2021-09-02 ENCOUNTER — Ambulatory Visit: Payer: Medicaid Other | Attending: Nurse Practitioner | Admitting: Nurse Practitioner

## 2021-09-02 ENCOUNTER — Encounter: Payer: Self-pay | Admitting: Nurse Practitioner

## 2021-09-02 ENCOUNTER — Other Ambulatory Visit: Payer: Self-pay

## 2021-09-02 VITALS — BP 100/60 | HR 78 | Ht 76.0 in | Wt 314.0 lb

## 2021-09-02 DIAGNOSIS — I10 Essential (primary) hypertension: Secondary | ICD-10-CM | POA: Diagnosis not present

## 2021-09-02 DIAGNOSIS — I5022 Chronic systolic (congestive) heart failure: Secondary | ICD-10-CM | POA: Insufficient documentation

## 2021-09-02 DIAGNOSIS — Z79899 Other long term (current) drug therapy: Secondary | ICD-10-CM | POA: Insufficient documentation

## 2021-09-02 DIAGNOSIS — Z7982 Long term (current) use of aspirin: Secondary | ICD-10-CM | POA: Insufficient documentation

## 2021-09-02 DIAGNOSIS — N182 Chronic kidney disease, stage 2 (mild): Secondary | ICD-10-CM | POA: Insufficient documentation

## 2021-09-02 DIAGNOSIS — Z1211 Encounter for screening for malignant neoplasm of colon: Secondary | ICD-10-CM | POA: Insufficient documentation

## 2021-09-02 DIAGNOSIS — F17210 Nicotine dependence, cigarettes, uncomplicated: Secondary | ICD-10-CM | POA: Insufficient documentation

## 2021-09-02 DIAGNOSIS — I130 Hypertensive heart and chronic kidney disease with heart failure and stage 1 through stage 4 chronic kidney disease, or unspecified chronic kidney disease: Secondary | ICD-10-CM | POA: Diagnosis not present

## 2021-09-02 DIAGNOSIS — Z7901 Long term (current) use of anticoagulants: Secondary | ICD-10-CM | POA: Insufficient documentation

## 2021-09-02 DIAGNOSIS — Z833 Family history of diabetes mellitus: Secondary | ICD-10-CM | POA: Insufficient documentation

## 2021-09-02 DIAGNOSIS — E1122 Type 2 diabetes mellitus with diabetic chronic kidney disease: Secondary | ICD-10-CM | POA: Insufficient documentation

## 2021-09-02 DIAGNOSIS — E785 Hyperlipidemia, unspecified: Secondary | ICD-10-CM | POA: Diagnosis not present

## 2021-09-02 DIAGNOSIS — Z7985 Long-term (current) use of injectable non-insulin antidiabetic drugs: Secondary | ICD-10-CM | POA: Insufficient documentation

## 2021-09-02 DIAGNOSIS — Z794 Long term (current) use of insulin: Secondary | ICD-10-CM | POA: Diagnosis not present

## 2021-09-02 DIAGNOSIS — Z6838 Body mass index (BMI) 38.0-38.9, adult: Secondary | ICD-10-CM | POA: Insufficient documentation

## 2021-09-02 LAB — POCT GLYCOSYLATED HEMOGLOBIN (HGB A1C): Hemoglobin A1C: 6.5 % — AB (ref 4.0–5.6)

## 2021-09-02 LAB — GLUCOSE, POCT (MANUAL RESULT ENTRY): POC Glucose: 129 mg/dl — AB (ref 70–99)

## 2021-09-02 MED ORDER — BLOOD PRESSURE MONITOR DEVI: 0 refills | Status: DC

## 2021-09-02 NOTE — Progress Notes (Signed)
Assessment & Plan:  [name] was seen today for diabetes.  Diagnoses and all orders for this visit:  Diabetes mellitus with stage 2 chronic kidney disease ([place]) -     POCT glucose (manual entry) -     POCT glycosylated hemoglobin (Hb A1C) -     CMP14+EGFR  Essential hypertension -     Blood Pressure Monitor DEVI; Please provide patient with insurance approved blood pressure monitor I10.0 -     CMP14+EGFR He will contact me via mychart regarding home BP readings  Dyslipidemia, goal LDL below 70 INSTRUCTIONS: Work on a low fat, heart healthy diet and participate in regular aerobic exercise program by working out at least 150 minutes per week; 5 days a week-30 minutes per day. Avoid red meat/beef/steak,  fried foods. junk foods, sodas, sugary drinks, unhealthy snacking, alcohol and smoking..    Colon cancer screening -     Ambulatory referral to Gastroenterology   Patient has been counseled on age-appropriate routine health concerns for screening and prevention. These are reviewed and up-to-date. Referrals have been placed accordingly. Immunizations are up-to-date or declined.    Subjective:   Chief Complaint  Patient presents with   Diabetes   [name] 45 y.o. male presents to office today for for follow up to DM, HTN and HPL He is being followed by cardiology for Heart failure PMH: HTN, HPL, Morbid Obesity, Tobacco dependence, cocaine abuse, NICM and chronic systolic heart failure with ICD, medication non adherence.    HTN Well controlled. I ordered him a BP monitor today. His blood pressure is soft today and he will need to monitor at home. He has been given parameters of what would be considered low BP readings. Endorses adherence taking carvedilol 12.5 mg BID, bidil 20-375.5 mg BID, entresto 49-51 mg BID, spironolactone 25 mg daily and torsemide 100 mg daily. Weight is down 8lbs since July.  BP Readings from Last 3 Encounters:  09/02/21 100/60  06/26/21 (!)  112/54  06/02/21 102/67    DM 2 Well controlled.  A1c down from 8.3 to 6.5. Checks his blood glucose levels in the am fasting and then prior to dinner. States average readings 100s. He is currently taking  victoza, farxiga, lantus 36 units. He has been instructed on how to reduce lantus based on fasting blood glucose readings. LDL not at goal with atorvastatin 40 mg daily. May need to switch to rosuvastatin.  Lab Results  Component Value Date   HGBA1C 6.5 (A) 09/02/2021   Lab Results  Component Value Date   LDLCALC 157 (H) 06/02/2021    Review of Systems  Constitutional:  Negative for fever, malaise/fatigue and weight loss.  HENT: Negative.  Negative for nosebleeds.   Eyes: Negative.  Negative for blurred vision, double vision and photophobia.  Respiratory: Negative.  Negative for cough and shortness of breath.   Cardiovascular: Negative.  Negative for chest pain, palpitations and leg swelling.  Gastrointestinal: Negative.  Negative for heartburn, nausea and vomiting.  Musculoskeletal: Negative.  Negative for myalgias.  Neurological: Negative.  Negative for dizziness, focal weakness, seizures and headaches.  Psychiatric/Behavioral: Negative.  Negative for suicidal ideas.    Past Medical History:  Diagnosis Date   Depression, major, single episode 01/14/2017   Diabetes mellitus [facility])    ED (erectile dysfunction)    History of syncope 01/29/2015   Hypertension    ICD (implantable cardioverter-defibrillator) discharge 11/30/2014   On 11/30/14. Asymptomatic.    Nonischemic cardiomyopathy ([place])  a.  echo 4/06: EF 30%, mild to mod MR, mild RAE, inf HK, lat HK , ant HK;    b.  cath 4/06: no CAD, EF 20-25%   NSVT (nonsustained ventricular tachycardia)    Obesity    Systolic CHF, chronic ([place])    EF 11/17 25-30%, s/p ICD    Past Surgical History:  Procedure Laterality Date   CARDIAC CATHETERIZATION  09/2011; 02/2013; 04/2013   CARDIAC DEFIBRILLATOR PLACEMENT  08/23/2013   IMPLANTABLE  CARDIOVERTER DEFIBRILLATOR IMPLANT N/A 08/23/2013   Procedure: IMPLANTABLE CARDIOVERTER DEFIBRILLATOR IMPLANT;  Surgeon: Deboraha Sprang, MD;  Location[facility] LAB;  Service: Cardiovascular;  Laterality: N/A;   LEFT AND RIGHT HEART CATHETERIZATION WITH CORONARY ANGIOGRAM N/A 09/20/2011   Procedure: LEFT AND RIGHT HEART CATHETERIZATION WITH CORONARY ANGIOGRAM;  Surgeon: Jolaine Artist, MD;  Location: [facility] C[facility] LAB;  Service: Cardiovascular;  Laterality: N/A;   MULTIPLE EXTRACTIONS WITH ALVEOLOPLASTY N/A 01/26/2013   Procedure:  EXTRACION  TOOTH # 19 WITH ALVEOLOPLASTY;  Surgeon: Lenn Cal, DDS;  Location: [facility];  Service: Oral Surgery;  Laterality: N/A;   RIGHT HEART CATHETERIZATION N/A 02/22/2013   Procedure: RIGHT HEART CATH;  Surgeon: Jolaine Artist, MD;  Location: [facility] CATH LAB;  Service: Cardiovascular;  Laterality: N/A;   RIGHT HEART CATHETERIZATION N/A 05/03/2013   Procedure: RIGHT HEART CATH;  Surgeon: Jolaine Artist, MD;  Location: [facility] CATH LAB;  Service: Cardiovascular;  Laterality: N/A;    Family History  Problem Relation Age of Onset   Coronary artery disease Mother 55       s/p PCI   Lung cancer Father    Diabetes type II Maternal Uncle    Coronary artery disease Maternal Uncle    Stroke Neg Hx    Heart attack Neg Hx     Social History Reviewed with no changes to be made today.   Outpatient Medications Prior to Visit  Medication Sig Dispense Refill   Accu-Chek Softclix Lancets lancets Check blood sugar twice daily E11.8 100 each 2   acetaminophen (TYLENOL) 500 MG tablet Take 1 tablet (500 mg total) by mouth every 6 (six) hours as needed. 30 tablet 0   aspirin EC 81 MG EC tablet Take 1 tablet (81 mg total) by mouth daily. 30 tablet 0   atorvastatin (LIPITOR) 40 MG tablet TAKE 1 TABLET (40 MG TOTAL) BY MOUTH DAILY. 30 tablet 2   Blood Glucose Calibration (ACCU-CHEK GUIDE CONTROL) LIQD 1 each by In Vitro route once as needed for up to 1 dose. 1 each 0   Blood  Glucose Monitoring Suppl (ACCU-CHEK GUIDE ME) w/Device KIT Check blood sugar twice daily E11.8 1 kit 0   clotrimazole-betamethasone (LOTRISONE) cream Apply 1 application topically daily. 60 g 1   dapagliflozin propanediol (FARXIGA) 10 MG TABS tablet TAKE 1 TABLET (10 MG TOTAL) BY MOUTH DAILY BEFORE BREAKFAST. 90 tablet 3   fluticasone (FLONASE) 50 MCG/ACT nasal spray Place 2 sprays into both nostrils daily. 16 g 6   glucose blood (ACCU-CHEK GUIDE) test strip Check blood sugar twice daily E11.8 100 each 2   insulin glargine (LANTUS SOLOSTAR) 100 UNIT/ML Solostar Pen INJECT 36 UNITS INTO THE SKIN DAILY AT 10 PM. 15 mL 0   Insulin Pen Needle 32G X 4 MM MISC INJECT 1 APPLICATION INTO THE SKIN DAILY. THE PATIENT IS INSULIN REQUIRING, ICD 10 CODE E11.9. THE PATIENT INJECTS 1 TIMES PER DAY. 100 each 3   isosorbide-hydrALAZINE (BIDIL) 20-37.5 MG tablet Take 1 tablet  by mouth 2 (two) times daily. 270 tablet 3   potassium chloride SA (KLOR-CON) 20 MEQ tablet TAKE 2 TABLETS (40 MEQ TOTAL) BY MOUTH 2 (TWO) TIMES DAILY. 120 tablet 11   sacubitril-valsartan (ENTRESTO) 49-51 MG TAKE 1 TABLET BY MOUTH 2 (TWO) TIMES DAILY. 60 tablet 11   sildenafil (VIAGRA) 100 MG tablet Take 1 tablet (100 mg total) by mouth daily as needed for erectile dysfunction. 10 tablet 0   torsemide (DEMADEX) 100 MG tablet TAKE 1 TABLET (100 MG TOTAL) BY MOUTH EVERY MORNING. MAY ALSO TAKE 0.5 TABLETS (50 MG TOTAL) AT BEDTIME AS NEEDED. 90 tablet 3   Blood Pressure Monitor DEVI Please provide patient with insurance approved blood pressure monitor 1 Device 0   carvedilol (COREG) 12.5 MG tablet TAKE 1 TABLET (12.5 MG TOTAL) BY MOUTH 2 (TWO) TIMES DAILY WITH A MEAL. 180 tablet 0   liraglutide (VICTOZA) 18 MG/3ML SOPN Inject 1.8 mg into the skin daily. 27 mL 1   spironolactone (ALDACTONE) 25 MG tablet TAKE 1 TABLET (25 MG TOTAL) BY MOUTH DAILY. 90 tablet 0   No facility-administered medications prior to visit.    Allergies  Allergen Reactions    Bydureon [Exenatide] Rash   Penicillins Other (See Comments)    Unknown childhood reaction        Objective:    BP 100/60   Pulse 78   Ht $R'6\' 4"'Lo$  (1.93 m)   Wt (!) 314 lb (142.4 kg)   SpO2 96%   BMI 38.22 kg/m  Wt Readings from Last 3 Encounters:  09/02/21 (!) 314 lb (142.4 kg)  06/02/21 (!) 322 lb 9.6 oz (146.3 kg)  04/24/21 (!) 319 lb 12.8 oz (145.1 kg)    Physical Exam Vitals and nursing note reviewed.  Constitutional:      Appearance: He is well-developed. He is obese.  HENT:     Head: Normocephalic and atraumatic.  Cardiovascular:     Rate and Rhythm: Normal rate and regular rhythm.     Heart sounds: Normal heart sounds. No murmur heard.   No friction rub. No gallop.  Pulmonary:     Effort: Pulmonary effort is normal. No tachypnea or respiratory distress.     Breath sounds: Normal breath sounds. No decreased breath sounds, wheezing, rhonchi or rales.  Chest:     Chest wall: No tenderness.  Abdominal:     General: Bowel sounds are normal.     Palpations: Abdomen is soft.  Musculoskeletal:        General: Normal range of motion.     Cervical back: Normal range of motion.  Skin:    General: Skin is warm and dry.  Neurological:     Mental Status: He is alert and oriented to person, place, and time.     Coordination: Coordination normal.  Psychiatric:        Behavior: Behavior normal. Behavior is cooperative.        Thought Content: Thought content normal.        Judgment: Judgment normal.         Patient has been counseled extensively about nutrition and exercise as well as the importance of adherence with medications and regular follow-up. The patient was given clear instructions to go to ER or return to medical center if symptoms don't improve, worsen or new problems develop. The patient verbalized understanding.   Follow-up: Return in about 3 months (around 12/03/2021).   Gildardo Pounds, FNP-BC [place] [facility] [place],  [place] [phone]   09/02/2021, 10:55 PM

## 2021-09-02 NOTE — Patient Instructions (Signed)
[facility] [phone]  DECREASE LANTUS BY 2 UNITS EVERY 3 DAYS IF YOUR FASTING BLOOD GLUCOSE LEVEL IS LESS THAN 90

## 2021-09-03 LAB — CMP14+EGFR
ALT: 15 IU/L (ref 0–44)
AST: 13 IU/L (ref 0–40)
Albumin/Globulin Ratio: 1.3 (ref 1.2–2.2)
Albumin: 4.3 g/dL (ref 4.0–5.0)
Alkaline Phosphatase: 96 IU/L (ref 44–121)
BUN/Creatinine Ratio: 14 (ref 9–20)
BUN: 20 mg/dL (ref 6–24)
Bilirubin Total: 0.3 mg/dL (ref 0.0–1.2)
CO2: 23 mmol/L (ref 20–29)
Calcium: 9.3 mg/dL (ref 8.7–10.2)
Chloride: 99 mmol/L (ref 96–106)
Creatinine, Ser: 1.46 mg/dL — ABNORMAL HIGH (ref 0.76–1.27)
Globulin, Total: 3.2 g/dL (ref 1.5–4.5)
Glucose: 114 mg/dL — ABNORMAL HIGH (ref 70–99)
Potassium: 4.5 mmol/L (ref 3.5–5.2)
Sodium: 142 mmol/L (ref 134–144)
Total Protein: 7.5 g/dL (ref 6.0–8.5)
eGFR: 60 mL/min/{1.73_m2} (ref >59)

## 2021-09-16 ENCOUNTER — Other Ambulatory Visit: Payer: Self-pay

## 2021-09-16 ENCOUNTER — Other Ambulatory Visit: Payer: Self-pay | Admitting: Nurse Practitioner

## 2021-09-16 DIAGNOSIS — I10 Essential (primary) hypertension: Secondary | ICD-10-CM

## 2021-09-16 DIAGNOSIS — I5042 Chronic combined systolic (congestive) and diastolic (congestive) heart failure: Secondary | ICD-10-CM

## 2021-09-16 NOTE — Telephone Encounter (Signed)
Requested medications are due for refill today.  yes  Requested medications are on the active medications list.  yes  Last refill. 06/02/2021 for both  Future visit scheduled.   yes  Notes to clinic.  Both prescriptions expired 08/31/2021.

## 2021-09-17 ENCOUNTER — Other Ambulatory Visit: Payer: Self-pay

## 2021-09-17 MED ORDER — CARVEDILOL 12.5 MG PO TABS
ORAL_TABLET | Freq: Two times a day (BID) | ORAL | 2 refills | Status: DC
Start: 2021-09-17 — End: 2022-02-10
  Filled 2021-09-17: qty 60, 30d supply, fill #0
  Filled 2021-11-17: qty 60, 30d supply, fill #1
  Filled 2022-01-08: qty 60, 30d supply, fill #0

## 2021-09-17 MED ORDER — SPIRONOLACTONE 25 MG PO TABS
ORAL_TABLET | Freq: Every day | ORAL | 2 refills | Status: DC
  Filled 2021-09-17 – 2021-10-03 (×2): qty 30, 30d supply, fill #0
  Filled 2021-11-17: qty 30, 30d supply, fill #1
  Filled 2022-01-08: qty 30, 30d supply, fill #0

## 2021-09-18 ENCOUNTER — Other Ambulatory Visit: Payer: Self-pay

## 2021-09-21 ENCOUNTER — Other Ambulatory Visit: Payer: Self-pay

## 2021-10-05 ENCOUNTER — Other Ambulatory Visit: Payer: Self-pay

## 2021-10-05 MED FILL — Torsemide Tab 100 MG: ORAL | 10 days supply | Qty: 15 | Fill #5 | Status: AC

## 2021-10-06 ENCOUNTER — Other Ambulatory Visit: Payer: Self-pay

## 2021-10-07 ENCOUNTER — Other Ambulatory Visit: Payer: Self-pay | Admitting: Nurse Practitioner

## 2021-10-07 ENCOUNTER — Other Ambulatory Visit: Payer: Self-pay

## 2021-10-07 DIAGNOSIS — E1122 Type 2 diabetes mellitus with diabetic chronic kidney disease: Secondary | ICD-10-CM

## 2021-10-07 MED ORDER — LANTUS SOLOSTAR 100 UNIT/ML ~~LOC~~ SOPN
PEN_INJECTOR | SUBCUTANEOUS | 0 refills | Status: DC
Start: 2021-10-07 — End: 2022-01-12
  Filled 2021-10-07: qty 12, 33d supply, fill #0

## 2021-10-07 NOTE — Telephone Encounter (Signed)
Requested Prescriptions  Pending Prescriptions Disp Refills  . insulin glargine (LANTUS SOLOSTAR) 100 UNIT/ML Solostar Pen 15 mL 0    Sig: INJECT 36 UNITS INTO THE SKIN DAILY AT 10 PM.     Endocrinology:  Diabetes - Insulins Passed - 10/07/2021  2:46 PM      Passed - HBA1C is between 0 and 7.9 and within 180 days    Hemoglobin A1C  Date Value Ref Range Status  09/02/2021 6.5 (A) 4.0 - 5.6 % Final   HbA1c, POC (controlled diabetic range)  Date Value Ref Range Status  06/02/2021 8.3 (A) 0.0 - 7.0 % Final         Passed - Valid encounter within last 6 months    Recent Outpatient Visits          1 month ago Diabetes mellitus with stage 2 chronic kidney disease (HCC)   [facility] [place], [place] W, NP   3 months ago Diabetes mellitus with stage 2 chronic kidney disease [facility])   [place] [place], Jeannett Senior L, RPH-CPP   4 months ago Diabetes mellitus with stage 2 chronic kidney disease [facility])   [facility] [place], Shea Stakes, NP   5 months ago COVID-19   [facility] [place], [place] W, NP   9 months ago Diabetes mellitus with stage 2 chronic kidney disease [facility])   [facility] [place], Shea Stakes, NP      Future Appointments            In 1 month Claiborne Rigg, NP [facility]

## 2021-10-12 ENCOUNTER — Other Ambulatory Visit: Payer: Self-pay

## 2021-10-16 ENCOUNTER — Other Ambulatory Visit (HOSPITAL_COMMUNITY): Payer: Self-pay | Admitting: Nurse Practitioner

## 2021-10-16 ENCOUNTER — Other Ambulatory Visit: Payer: Self-pay

## 2021-10-19 ENCOUNTER — Other Ambulatory Visit: Payer: Self-pay

## 2021-10-23 ENCOUNTER — Other Ambulatory Visit: Payer: Self-pay

## 2021-11-07 ENCOUNTER — Other Ambulatory Visit (HOSPITAL_COMMUNITY): Payer: Self-pay | Admitting: Family Medicine

## 2021-11-10 ENCOUNTER — Other Ambulatory Visit: Payer: Self-pay

## 2021-11-10 MED ORDER — TORSEMIDE 100 MG PO TABS
ORAL_TABLET | ORAL | 0 refills | Status: DC
  Filled 2021-11-10 – 2021-11-17 (×2): qty 45, 30d supply, fill #0

## 2021-11-11 ENCOUNTER — Other Ambulatory Visit: Payer: Self-pay

## 2021-11-16 ENCOUNTER — Other Ambulatory Visit: Payer: Self-pay

## 2021-11-17 ENCOUNTER — Other Ambulatory Visit: Payer: Self-pay

## 2021-12-04 ENCOUNTER — Ambulatory Visit: Payer: Medicaid Other | Admitting: Nurse Practitioner

## 2022-01-08 ENCOUNTER — Other Ambulatory Visit (HOSPITAL_COMMUNITY): Payer: Self-pay | Admitting: Family Medicine

## 2022-01-08 ENCOUNTER — Other Ambulatory Visit: Payer: Self-pay

## 2022-01-08 MED ORDER — TORSEMIDE 100 MG PO TABS
100.0000 mg | ORAL_TABLET | Freq: Every day | ORAL | 0 refills | Status: DC
  Filled 2022-01-08: qty 30, 30d supply, fill #0

## 2022-01-12 ENCOUNTER — Other Ambulatory Visit: Payer: Self-pay

## 2022-01-12 ENCOUNTER — Other Ambulatory Visit: Payer: Self-pay | Admitting: Nurse Practitioner

## 2022-01-12 DIAGNOSIS — E1122 Type 2 diabetes mellitus with diabetic chronic kidney disease: Secondary | ICD-10-CM

## 2022-01-12 DIAGNOSIS — N182 Chronic kidney disease, stage 2 (mild): Secondary | ICD-10-CM

## 2022-01-13 ENCOUNTER — Other Ambulatory Visit: Payer: Self-pay

## 2022-01-13 ENCOUNTER — Other Ambulatory Visit: Payer: Self-pay | Admitting: Pharmacist

## 2022-01-13 MED ORDER — LANTUS SOLOSTAR 100 UNIT/ML ~~LOC~~ SOPN
PEN_INJECTOR | SUBCUTANEOUS | 0 refills | Status: DC
  Filled 2022-01-13: qty 12, 33d supply, fill #0

## 2022-01-13 MED ORDER — BASAGLAR KWIKPEN 100 UNIT/ML ~~LOC~~ SOPN
36.0000 [IU] | PEN_INJECTOR | Freq: Every day | SUBCUTANEOUS | 0 refills | Status: DC
  Filled 2022-01-13 – 2022-02-10 (×2): qty 15, 41d supply, fill #0

## 2022-01-13 NOTE — Telephone Encounter (Signed)
Requested Prescriptions  ?Pending Prescriptions Disp Refills  ?? insulin glargine (LANTUS SOLOSTAR) 100 UNIT/ML Solostar Pen 15 mL 0  ?  Sig: INJECT 36 UNITS INTO THE SKIN DAILY AT 10 PM.  ?  ? Endocrinology:  Diabetes - Insulins Passed - 01/12/2022  3:34 PM  ?  ?  Passed - HBA1C is between 0 and 7.9 and within 180 days  ?  Hemoglobin A1C  ?Date Value Ref Range Status  ?09/02/2021 6.5 (A) 4.0 - 5.6 % Final  ? ?HbA1c, POC (controlled diabetic range)  ?Date Value Ref Range Status  ?06/02/2021 8.3 (A) 0.0 - 7.0 % Final  ?   ?  ?  Passed - Valid encounter within last 6 months  ?  Recent Outpatient Visits   ?      ? 4 months ago Diabetes mellitus with stage 2 chronic kidney disease ([place])  ? [place], [place] W, NP  ? 6 months ago Diabetes mellitus with stage 2 chronic kidney disease ([place])  ? [place], RPH-CPP  ? 7 months ago Diabetes mellitus with stage 2 chronic kidney disease ([place])  ? [place], [place] W, NP  ? 8 months ago COVID-19  ? [place], [place] W, NP  ? 1 year ago Diabetes mellitus with stage 2 chronic kidney disease ([place])  ? [place], [place] Buff, NP  ?  ?  ? ?  ?  ?  ? ?

## 2022-01-14 ENCOUNTER — Other Ambulatory Visit: Payer: Self-pay

## 2022-01-19 ENCOUNTER — Other Ambulatory Visit: Payer: Self-pay

## 2022-01-21 ENCOUNTER — Other Ambulatory Visit: Payer: Self-pay

## 2022-02-10 ENCOUNTER — Other Ambulatory Visit: Payer: Self-pay | Admitting: Family Medicine

## 2022-02-10 ENCOUNTER — Other Ambulatory Visit (HOSPITAL_COMMUNITY): Payer: Self-pay | Admitting: Family Medicine

## 2022-02-10 ENCOUNTER — Other Ambulatory Visit: Payer: Self-pay | Admitting: Nurse Practitioner

## 2022-02-10 ENCOUNTER — Other Ambulatory Visit: Payer: Self-pay

## 2022-02-10 DIAGNOSIS — E782 Mixed hyperlipidemia: Secondary | ICD-10-CM

## 2022-02-10 DIAGNOSIS — I10 Essential (primary) hypertension: Secondary | ICD-10-CM

## 2022-02-10 DIAGNOSIS — I5042 Chronic combined systolic (congestive) and diastolic (congestive) heart failure: Secondary | ICD-10-CM

## 2022-02-10 MED ORDER — TORSEMIDE 100 MG PO TABS
100.0000 mg | ORAL_TABLET | Freq: Every day | ORAL | 0 refills | Status: DC
  Filled 2022-02-10: qty 15, 15d supply, fill #0

## 2022-02-10 MED ORDER — ATORVASTATIN CALCIUM 40 MG PO TABS
ORAL_TABLET | Freq: Every day | ORAL | 0 refills | Status: DC
  Filled 2022-02-10: qty 30, 30d supply, fill #0

## 2022-02-10 MED ORDER — SPIRONOLACTONE 25 MG PO TABS
ORAL_TABLET | Freq: Every day | ORAL | 0 refills | Status: DC
  Filled 2022-02-10: qty 30, 30d supply, fill #0

## 2022-02-10 MED ORDER — CARVEDILOL 12.5 MG PO TABS
ORAL_TABLET | Freq: Two times a day (BID) | ORAL | 0 refills | Status: DC
  Filled 2022-02-10: qty 60, 30d supply, fill #0

## 2022-02-10 MED ORDER — ACCU-CHEK GUIDE VI STRP
ORAL_STRIP | 0 refills | Status: DC
  Filled 2022-02-10: qty 100, 50d supply, fill #0

## 2022-02-12 ENCOUNTER — Other Ambulatory Visit: Payer: Self-pay

## 2022-02-23 ENCOUNTER — Other Ambulatory Visit: Payer: Self-pay | Admitting: Obstetrics and Gynecology

## 2022-02-23 NOTE — Patient Outreach (Signed)
Care Coordination ? ?02/23/2022 ? ?[name] ?04/22/1976 ?[id number] ? ? ?Medicaid Managed Care  ? ?Unsuccessful Outreach Note ? ?02/23/2022 ?Name: Douglas [name] MRN: [id number] DOB: 02/19/1976 ? ?Referred by: Claiborne Rigg, NP ?Reason for referral : High Risk Managed Medicaid (Unsuccessful telephone outreach) ? ? ?A second unsuccessful telephone outreach was attempted today. The patient was referred to the case management team for assistance with care management and care coordination.  ? ?Follow Up Plan: The care management team will reach out to the patient again over the next 30 business days.  ? ?Kathi Der RN, BSN ?[place]  [facility] ?Care Management Coordinator - Managed Medicaid High Risk ?[phone] ?  ? ? ?

## 2022-02-23 NOTE — Patient Instructions (Signed)
Visit Information ? ?[name]  - as a part of your Medicaid benefit, you are eligible for care management and care coordination services at no cost or copay. I was unable to reach you by phone today but would be happy to help you with your health related needs. Please feel free to call me at [phone]. ? ?A member of the Managed Medicaid care management team will reach out to you again over the next 30 business  days.  ? ?Kathi Der RN, BSN ?[place]  [facility] ?Care Management Coordinator - Managed Medicaid High Risk ?[phone] ?  ?

## 2022-02-28 ENCOUNTER — Emergency Department (HOSPITAL_COMMUNITY): Payer: Medicaid Other

## 2022-02-28 ENCOUNTER — Other Ambulatory Visit: Payer: Self-pay

## 2022-02-28 ENCOUNTER — Inpatient Hospital Stay (HOSPITAL_COMMUNITY)
Admission: EM | Admit: 2022-02-28 | Discharge: 2022-03-05 | DRG: 286 | Disposition: A | Discharge status: Home / Self Care | Payer: Medicaid Other | Attending: Cardiology | Admitting: Cardiology

## 2022-02-28 ENCOUNTER — Encounter (HOSPITAL_COMMUNITY): Payer: Self-pay | Admitting: Emergency Medicine

## 2022-02-28 DIAGNOSIS — I2510 Atherosclerotic heart disease of native coronary artery without angina pectoris: Secondary | ICD-10-CM | POA: Diagnosis present

## 2022-02-28 DIAGNOSIS — F1410 Cocaine abuse, uncomplicated: Secondary | ICD-10-CM | POA: Diagnosis present

## 2022-02-28 DIAGNOSIS — Z560 Unemployment, unspecified: Secondary | ICD-10-CM

## 2022-02-28 DIAGNOSIS — N182 Chronic kidney disease, stage 2 (mild): Secondary | ICD-10-CM | POA: Diagnosis present

## 2022-02-28 DIAGNOSIS — Z8249 Family history of ischemic heart disease and other diseases of the circulatory system: Secondary | ICD-10-CM

## 2022-02-28 DIAGNOSIS — R0600 Dyspnea, unspecified: Principal | ICD-10-CM

## 2022-02-28 DIAGNOSIS — E1122 Type 2 diabetes mellitus with diabetic chronic kidney disease: Secondary | ICD-10-CM | POA: Diagnosis present

## 2022-02-28 DIAGNOSIS — R079 Chest pain, unspecified: Secondary | ICD-10-CM | POA: Diagnosis not present

## 2022-02-28 DIAGNOSIS — I509 Heart failure, unspecified: Secondary | ICD-10-CM

## 2022-02-28 DIAGNOSIS — I110 Hypertensive heart disease with heart failure: Secondary | ICD-10-CM | POA: Diagnosis not present

## 2022-02-28 DIAGNOSIS — I130 Hypertensive heart and chronic kidney disease with heart failure and stage 1 through stage 4 chronic kidney disease, or unspecified chronic kidney disease: Principal | ICD-10-CM | POA: Diagnosis present

## 2022-02-28 DIAGNOSIS — I272 Pulmonary hypertension, unspecified: Secondary | ICD-10-CM | POA: Diagnosis present

## 2022-02-28 DIAGNOSIS — Z6835 Body mass index (BMI) 35.0-35.9, adult: Secondary | ICD-10-CM

## 2022-02-28 DIAGNOSIS — I428 Other cardiomyopathies: Secondary | ICD-10-CM | POA: Diagnosis present

## 2022-02-28 DIAGNOSIS — Z833 Family history of diabetes mellitus: Secondary | ICD-10-CM | POA: Diagnosis not present

## 2022-02-28 DIAGNOSIS — R0602 Shortness of breath: Secondary | ICD-10-CM | POA: Diagnosis not present

## 2022-02-28 DIAGNOSIS — R0902 Hypoxemia: Secondary | ICD-10-CM | POA: Diagnosis not present

## 2022-02-28 DIAGNOSIS — Z801 Family history of malignant neoplasm of trachea, bronchus and lung: Secondary | ICD-10-CM

## 2022-02-28 DIAGNOSIS — Z7982 Long term (current) use of aspirin: Secondary | ICD-10-CM

## 2022-02-28 DIAGNOSIS — Z20822 Contact with and (suspected) exposure to covid-19: Secondary | ICD-10-CM | POA: Diagnosis present

## 2022-02-28 DIAGNOSIS — Z888 Allergy status to other drugs, medicaments and biological substances status: Secondary | ICD-10-CM

## 2022-02-28 DIAGNOSIS — I5043 Acute on chronic combined systolic (congestive) and diastolic (congestive) heart failure: Secondary | ICD-10-CM | POA: Diagnosis not present

## 2022-02-28 DIAGNOSIS — E785 Hyperlipidemia, unspecified: Secondary | ICD-10-CM | POA: Diagnosis present

## 2022-02-28 DIAGNOSIS — Z880 Allergy status to penicillin: Secondary | ICD-10-CM | POA: Diagnosis not present

## 2022-02-28 DIAGNOSIS — I472 Ventricular tachycardia, unspecified: Secondary | ICD-10-CM | POA: Diagnosis present

## 2022-02-28 DIAGNOSIS — I5023 Acute on chronic systolic (congestive) heart failure: Secondary | ICD-10-CM | POA: Diagnosis present

## 2022-02-28 DIAGNOSIS — E669 Obesity, unspecified: Secondary | ICD-10-CM | POA: Diagnosis present

## 2022-02-28 DIAGNOSIS — Z95810 Presence of automatic (implantable) cardiac defibrillator: Secondary | ICD-10-CM | POA: Diagnosis not present

## 2022-02-28 DIAGNOSIS — Z79899 Other long term (current) drug therapy: Secondary | ICD-10-CM

## 2022-02-28 DIAGNOSIS — I5021 Acute systolic (congestive) heart failure: Secondary | ICD-10-CM | POA: Diagnosis not present

## 2022-02-28 DIAGNOSIS — F17210 Nicotine dependence, cigarettes, uncomplicated: Secondary | ICD-10-CM | POA: Diagnosis present

## 2022-02-28 DIAGNOSIS — Z794 Long term (current) use of insulin: Secondary | ICD-10-CM

## 2022-02-28 DIAGNOSIS — E1165 Type 2 diabetes mellitus with hyperglycemia: Secondary | ICD-10-CM | POA: Diagnosis present

## 2022-02-28 DIAGNOSIS — I471 Supraventricular tachycardia: Secondary | ICD-10-CM | POA: Diagnosis not present

## 2022-02-28 DIAGNOSIS — I493 Ventricular premature depolarization: Secondary | ICD-10-CM | POA: Diagnosis present

## 2022-02-28 DIAGNOSIS — I5042 Chronic combined systolic (congestive) and diastolic (congestive) heart failure: Secondary | ICD-10-CM

## 2022-02-28 DIAGNOSIS — E782 Mixed hyperlipidemia: Secondary | ICD-10-CM

## 2022-02-28 DIAGNOSIS — E876 Hypokalemia: Secondary | ICD-10-CM | POA: Diagnosis present

## 2022-02-28 DIAGNOSIS — Z5902 Unsheltered homelessness: Secondary | ICD-10-CM

## 2022-02-28 DIAGNOSIS — Z91148 Patient's other noncompliance with medication regimen for other reason: Secondary | ICD-10-CM

## 2022-02-28 DIAGNOSIS — I517 Cardiomegaly: Secondary | ICD-10-CM | POA: Diagnosis not present

## 2022-02-28 LAB — BASIC METABOLIC PANEL
Anion gap: 10 (ref 5–15)
Anion gap: 11 (ref 5–15)
BUN: 13 mg/dL (ref 6–20)
BUN: 14 mg/dL (ref 6–20)
CO2: 27 mmol/L (ref 22–32)
CO2: 27 mmol/L (ref 22–32)
Calcium: 8.4 mg/dL — ABNORMAL LOW (ref 8.9–10.3)
Calcium: 8.4 mg/dL — ABNORMAL LOW (ref 8.9–10.3)
Chloride: 101 mmol/L (ref 98–111)
Chloride: 100 mmol/L (ref 98–111)
Creatinine, Ser: 1.25 mg/dL — ABNORMAL HIGH (ref 0.61–1.24)
Creatinine, Ser: 1.43 mg/dL — ABNORMAL HIGH (ref 0.61–1.24)
GFR, Estimated: >60 mL/min (ref >60)
GFR, Estimated: >60 mL/min (ref >60)
Glucose, Bld: 207 mg/dL — ABNORMAL HIGH (ref 70–99)
Glucose, Bld: 198 mg/dL — ABNORMAL HIGH (ref 70–99)
Potassium: 2.6 mmol/L — CL (ref 3.5–5.1)
Potassium: 3.3 mmol/L — ABNORMAL LOW (ref 3.5–5.1)
Sodium: 138 mmol/L (ref 135–145)
Sodium: 138 mmol/L (ref 135–145)

## 2022-02-28 LAB — CBC
HCT: 43.9 % (ref 39.0–52.0)
Hemoglobin: 15.6 g/dL (ref 13.0–17.0)
MCH: 30.2 pg (ref 26.0–34.0)
MCHC: 35.5 g/dL (ref 30.0–36.0)
MCV: 85.1 fL (ref 80.0–100.0)
Platelets: 224 10*3/uL (ref 150–400)
RBC: 5.16 MIL/uL (ref 4.22–5.81)
RDW: 15.1 % (ref 11.5–15.5)
WBC: 7.6 10*3/uL (ref 4.0–10.5)
nRBC: 0 % (ref 0.0–0.2)

## 2022-02-28 LAB — RAPID URINE DRUG SCREEN, HOSP PERFORMED
Amphetamines: NOT DETECTED
Barbiturates: NOT DETECTED
Benzodiazepines: NOT DETECTED
Cocaine: POSITIVE — AB
Opiates: NOT DETECTED
Tetrahydrocannabinol: NOT DETECTED

## 2022-02-28 LAB — BRAIN NATRIURETIC PEPTIDE: B Natriuretic Peptide: 1287.2 pg/mL — ABNORMAL HIGH (ref 0.0–100.0)

## 2022-02-28 LAB — HIV ANTIBODY (ROUTINE TESTING W REFLEX): HIV Screen 4th Generation wRfx: NONREACTIVE

## 2022-02-28 LAB — TROPONIN I (HIGH SENSITIVITY)
Troponin I (High Sensitivity): 143 ng/L (ref <18)
Troponin I (High Sensitivity): 142 ng/L (ref <18)

## 2022-02-28 LAB — RESP PANEL BY RT-PCR (FLU A&B, COVID) ARPGX2
Influenza A by PCR: NEGATIVE
Influenza B by PCR: NEGATIVE
SARS Coronavirus 2 by RT PCR: NEGATIVE

## 2022-02-28 LAB — MAGNESIUM: Magnesium: 1.6 mg/dL — ABNORMAL LOW (ref 1.7–2.4)

## 2022-02-28 LAB — HEMOGLOBIN A1C
Hgb A1c MFr Bld: 7.4 % — ABNORMAL HIGH (ref 4.8–5.6)
Mean Plasma Glucose: 165.68 mg/dL

## 2022-02-28 LAB — CBG MONITORING, ED: Glucose-Capillary: 207 mg/dL — ABNORMAL HIGH (ref 70–99)

## 2022-02-28 MED ORDER — ATORVASTATIN CALCIUM 40 MG PO TABS
40.0000 mg | ORAL_TABLET | Freq: Every day | ORAL | Status: DC
  Administered 2022-03-01 – 2022-03-05 (×5): 40 mg via ORAL
  Filled 2022-02-28 (×6): qty 1

## 2022-02-28 MED ORDER — ONDANSETRON HCL 4 MG/2ML IJ SOLN: 4.0000 mg | Freq: Four times a day (QID) | INTRAMUSCULAR | Status: DC | PRN

## 2022-02-28 MED ORDER — FLUTICASONE PROPIONATE 50 MCG/ACT NA SUSP: 2.0000 | Freq: Every day | NASAL | Status: DC

## 2022-02-28 MED ORDER — DAPAGLIFLOZIN PROPANEDIOL 10 MG PO TABS
10.0000 mg | ORAL_TABLET | Freq: Every day | ORAL | Status: DC
  Administered 2022-02-28 – 2022-03-05 (×6): 10 mg via ORAL
  Filled 2022-02-28 (×6): qty 1

## 2022-02-28 MED ORDER — FUROSEMIDE 10 MG/ML IJ SOLN
120.0000 mg | Freq: Two times a day (BID) | INTRAMUSCULAR | Status: DC
  Administered 2022-02-28 – 2022-03-04 (×8): 120 mg via INTRAVENOUS
  Filled 2022-02-28: qty 10
  Filled 2022-02-28: qty 2
  Filled 2022-02-28 (×3): qty 12
  Filled 2022-02-28 (×4): qty 10
  Filled 2022-02-28: qty 12

## 2022-02-28 MED ORDER — MAGNESIUM SULFATE 2 GM/50ML IV SOLN
2.0000 g | Freq: Once | INTRAVENOUS | Status: AC
  Administered 2022-02-28: 2 g via INTRAVENOUS
  Filled 2022-02-28: qty 50

## 2022-02-28 MED ORDER — POTASSIUM CHLORIDE CRYS ER 20 MEQ PO TBCR
40.0000 meq | EXTENDED_RELEASE_TABLET | Freq: Once | ORAL | Status: AC
  Administered 2022-02-28: 40 meq via ORAL
  Filled 2022-02-28: qty 2

## 2022-02-28 MED ORDER — SPIRONOLACTONE 25 MG PO TABS
25.0000 mg | ORAL_TABLET | Freq: Every day | ORAL | Status: DC
  Administered 2022-03-01 – 2022-03-05 (×5): 25 mg via ORAL
  Filled 2022-02-28 (×5): qty 1

## 2022-02-28 MED ORDER — ASPIRIN EC 81 MG PO TBEC
81.0000 mg | DELAYED_RELEASE_TABLET | Freq: Every day | ORAL | Status: DC
  Administered 2022-03-01 – 2022-03-05 (×5): 81 mg via ORAL
  Filled 2022-02-28 (×5): qty 1

## 2022-02-28 MED ORDER — ACETAMINOPHEN 325 MG PO TABS
650.0000 mg | ORAL_TABLET | ORAL | Status: DC | PRN
  Administered 2022-03-01: 650 mg via ORAL
  Filled 2022-02-28: qty 2

## 2022-02-28 MED ORDER — POTASSIUM CHLORIDE CRYS ER 20 MEQ PO TBCR
40.0000 meq | EXTENDED_RELEASE_TABLET | Freq: Once | ORAL | Status: AC
Start: 2022-02-28 — End: 2022-02-28
  Administered 2022-02-28: 40 meq via ORAL
  Filled 2022-02-28: qty 2

## 2022-02-28 MED ORDER — POTASSIUM CHLORIDE 10 MEQ/100ML IV SOLN
10.0000 meq | INTRAVENOUS | Status: AC
  Administered 2022-02-28 (×4): 10 meq via INTRAVENOUS
  Filled 2022-02-28 (×4): qty 100

## 2022-02-28 MED ORDER — CARVEDILOL 12.5 MG PO TABS: 12.5000 mg | ORAL_TABLET | Freq: Two times a day (BID) | ORAL | Status: DC

## 2022-02-28 MED ORDER — POTASSIUM CHLORIDE CRYS ER 20 MEQ PO TBCR: 40.0000 meq | EXTENDED_RELEASE_TABLET | Freq: Once | ORAL | Status: DC

## 2022-02-28 MED ORDER — HEPARIN SODIUM (PORCINE) 5000 UNIT/ML IJ SOLN
5000.0000 [IU] | Freq: Three times a day (TID) | INTRAMUSCULAR | Status: DC
  Administered 2022-02-28 – 2022-03-03 (×9): 5000 [IU] via SUBCUTANEOUS
  Filled 2022-02-28 (×9): qty 1

## 2022-02-28 MED ORDER — INSULIN ASPART 100 UNIT/ML IJ SOLN
0.0000 [IU] | Freq: Three times a day (TID) | INTRAMUSCULAR | Status: DC
  Administered 2022-03-01 (×2): 3 [IU] via SUBCUTANEOUS
  Administered 2022-03-01 – 2022-03-04 (×7): 2 [IU] via SUBCUTANEOUS
  Administered 2022-03-04: 3 [IU] via SUBCUTANEOUS
  Administered 2022-03-05: 2 [IU] via SUBCUTANEOUS

## 2022-02-28 MED ORDER — CARVEDILOL 12.5 MG PO TABS
12.5000 mg | ORAL_TABLET | Freq: Two times a day (BID) | ORAL | Status: DC
  Administered 2022-02-28 – 2022-03-03 (×6): 12.5 mg via ORAL
  Filled 2022-02-28 (×6): qty 1

## 2022-02-28 MED ORDER — SACUBITRIL-VALSARTAN 49-51 MG PO TABS
1.0000 | ORAL_TABLET | Freq: Two times a day (BID) | ORAL | Status: DC
  Administered 2022-02-28 – 2022-03-05 (×10): 1 via ORAL
  Filled 2022-02-28 (×11): qty 1

## 2022-02-28 MED ORDER — INSULIN GLARGINE-YFGN 100 UNIT/ML ~~LOC~~ SOLN
18.0000 [IU] | Freq: Every day | SUBCUTANEOUS | Status: DC
  Administered 2022-02-28 – 2022-03-04 (×5): 18 [IU] via SUBCUTANEOUS
  Filled 2022-02-28 (×6): qty 0.18

## 2022-02-28 MED ORDER — NITROGLYCERIN 0.4 MG SL SUBL
0.4000 mg | SUBLINGUAL_TABLET | SUBLINGUAL | Status: DC | PRN
Start: 2022-02-28 — End: 2022-03-05

## 2022-02-28 NOTE — H&P (Addendum)
?Cardiology Admission History and Physical:  ? ?Patient ID: Douglas [name] ?MRN: [id number]; DOB: June 08, 1976  ? ?Admission date: 02/28/2022 ? ?PCP:  Gildardo Pounds, NP ?  ?[facility] Providers ?Cardiologist:  None   { ? ? ? ?Chief Complaint: shortness of breath ? ?Patient Profile:  ? ?Douglas [name] is a 46 y.o. male with normal coronaries on cardiac catheterization in 2006, non-ischemic cardiomyopathy/ chronic systolic CHF with EF as low as 15% in the past but 30-35% on Echo in 04/2021, VT s/p [employer] ICD in 07/2013, hypertension, hyperlipidemia, type 2 diabetes on insulin, obesity, tobacco abuse, and cocaine abuse who is being seen 02/28/2022 for the evaluation of acute on chronic CHF. ? ?History of Present Illness:  ? ?Mr. Popwell is a 46 year old male with the above history who is followed by Dr. Haroldine Laws.  Patient has a long history of nonischemic cardiomyopathy.  Initially diagnosed in 2006 when found to have an EF 20-25%.  Cardiac cath at that time showed normal coronaries.  EF later recovered in late 2013 but then fell again to 15% in 01/2013.  He was admitted in 02/2013 for low output symptoms and volume overload.  He was diuresed with IV Lasix and metolazone.  He did have runs of VT that admission and was started on Amiodarone per EP.  He had a LifeVest placed at discharge and then ultimately underwent ICD placement in 07/2013.  Last ischemic evaluation was a Myoview in 2016 which showed a fixed anterior and lateral defect but no reversible ischemia. It was considered high risk due to reduced EF.  Last echo in 04/2021 showed LVEF of 30-35% with no regional wall motion abnormalities and grade 1 diastolic dysfunction, normal RV and no significant valvular disease.  He was last seen by Dr. Haroldine Laws in 04/2021 at which time he was doing well.  He reported mild dyspnea on exertion if he carries but otherwise denied any shortness of breath.  He was compliant with all his medications at that time but did  report occasional cocaine use as well as ongoing tobacco use. ? ?Patient presents to the ED today for further outpatient evaluation of shortness of breath.  Patient reports a difficult few weeks.  He lost his job in 10/2021 and has been living in his car  He realized he was running short on his Torsemide last week so about 4 days ago he started taking 50 mg daily rather than his usual 100 mg daily.  His brother is on Lasix and so about 2 days ago he also started taking 20 mg of Lasix with his Torsemide 50 mg.  He started noticing dyspnea both with exertion but more so at night 2 days ago.  He states he has not been sleeping due to orthopnea.  He had 1 episode of left-sided chest/rib pain when he got up to use the restroom 2 nights ago that lasted for a few minutes and then resolved.  He has had no recurrent chest pain.  This occurred after he became acutely short of breath after getting up.  He has not noticed any significant lower extremity edema but does note about a 5 to 7 pound weight gain over the last couple months since losing his job. He reports some palpitations when he gets acutely short of breath but denies any outside of these times. No lightheadedness, dizziness, or syncope. No recent fevers or illness. He reports intermittent cough but no nasal congestion. No abnormal bleeding. ? ?Upon arrival to  the ED, patient mildly tachypneic buts vitals stable.  EKG shows normal sinus rhythm, rate 75 bpm, with PVC but no acute ST/T changes.  He does have a prolonged QTc of 536 ms.  Initial high-sensitivity troponin 143.  BNP markedly elevated at 1287.  Chest x-ray showed cardiomegaly with central pulmonary vessels and interstitial markings in the perihilar regions as well as minimal bilateral pleural effusions suggestive of CHF.  WBC 7.6, Hgb 15.6, Plts 224. Na 138, K 2.6, Glucose 207, Cr 1.25.  ? ?As noted above, patient started running out of his torsemide last week.  He does state he has been compliant with his  Entresto, spironolactone, Coreg, aspirin, and Lipitor,.  He states he has not taken his potassium supplement for about a month, his BiDil for a couple weeks, or his [place] for about 1 week.  Since losing his job and living in his car, he states he has been eating a lot of snack foods which he knows has a lot of sodium. It also sounds like he has been drinking a lot of fluid (water and juice). He does report ongoing cocaine use and states he last used within the past 5 days.  He also reports ongoing tobacco use and states he smokes 1 pack of cigarettes every 2 to 3 days.  ? ?Past Medical History:  ?Diagnosis Date  ? Depression, major, single episode 01/14/2017  ? Diabetes mellitus ([place])   ? ED (erectile dysfunction)   ? History of syncope 01/29/2015  ? Hypertension   ? ICD (implantable cardioverter-defibrillator) discharge 11/30/2014  ? On 11/30/14. Asymptomatic.   ? Nonischemic cardiomyopathy ([place])   ? a.  echo 4/06: EF 30%, mild to mod MR, mild RAE, inf HK, lat HK , ant HK;    b.  cath 4/06: no CAD, EF 20-25%  ? NSVT (nonsustained ventricular tachycardia) ([place])   ? Obesity   ? Systolic CHF, chronic ([place])   ? EF 11/17 25-30%, s/p ICD  ? ? ?Past Surgical History:  ?Procedure Laterality Date  ? CARDIAC CATHETERIZATION  09/2011; 02/2013; 04/2013  ? CARDIAC DEFIBRILLATOR PLACEMENT  08/23/2013  ? IMPLANTABLE CARDIOVERTER DEFIBRILLATOR IMPLANT N/A 08/23/2013  ? Procedure: IMPLANTABLE CARDIOVERTER DEFIBRILLATOR IMPLANT;  Surgeon: Deboraha Sprang, MD;  Location: [facility] CATH LAB;  Service: Cardiovascular;  Laterality: N/A;  ? LEFT AND RIGHT HEART CATHETERIZATION WITH CORONARY ANGIOGRAM N/A 09/20/2011  ? Procedure: LEFT AND RIGHT HEART CATHETERIZATION WITH CORONARY ANGIOGRAM;  Surgeon: Jolaine Artist, MD;  Location: [facility] CATH LAB;  Service: Cardiovascular;  Laterality: N/A;  ? MULTIPLE EXTRACTIONS WITH ALVEOLOPLASTY N/A 01/26/2013  ? Procedure:  EXTRACION  TOOTH # 19 WITH ALVEOLOPLASTY;  Surgeon: Lenn Cal, DDS;  Location: [place];   Service: Oral Surgery;  Laterality: N/A;  ? RIGHT HEART CATHETERIZATION N/A 02/22/2013  ? Procedure: RIGHT HEART CATH;  Surgeon: Jolaine Artist, MD;  Location: [facility] CATH LAB;  Service: Cardiovascular;  Laterality: N/A;  ? RIGHT HEART CATHETERIZATION N/A 05/03/2013  ? Procedure: RIGHT HEART CATH;  Surgeon: Jolaine Artist, MD;  Location: [facility] CATH LAB;  Service: Cardiovascular;  Laterality: N/A;  ?  ? ?Medications Prior to Admission: ?Prior to Admission medications   ?Medication Sig Start Date End Date Taking? Authorizing Provider  ?Accu-Chek Softclix Lancets lancets Check blood sugar twice daily E11.8 04/03/21   [place] Rakes, MD  ?acetaminophen (TYLENOL) 500 MG tablet Take 1 tablet (500 mg total) by mouth every 6 (six) hours as needed. 04/29/21   Gildardo Pounds, NP  ?  aspirin EC 81 MG EC tablet Take 1 tablet (81 mg total) by mouth daily. 04/23/15   Kelby Aline, MD  ?atorvastatin (LIPITOR) 40 MG tablet TAKE 1 TABLET (40 MG TOTAL) BY MOUTH DAILY. 02/10/22 02/10/23  [place] Rakes, MD  ?Blood Glucose Calibration (ACCU-CHEK GUIDE CONTROL) LIQD 1 each by In Vitro route once as needed for up to 1 dose. 03/30/19   Gildardo Pounds, NP  ?Blood Glucose Monitoring Suppl (ACCU-CHEK GUIDE ME) w/Device KIT Check blood sugar twice daily E11.8 04/03/21   [place] Rakes, MD  ?Blood Pressure Monitor DEVI Please provide patient with insurance approved blood pressure monitor I10.0 09/02/21   Gildardo Pounds, NP  ?carvedilol (COREG) 12.5 MG tablet TAKE 1 TABLET (12.5 MG TOTAL) BY MOUTH 2 (TWO) TIMES DAILY WITH A MEAL. 02/10/22 05/11/22  [place] Rakes, MD  ?clotrimazole-betamethasone (LOTRISONE) cream Apply 1 application topically daily. 06/02/21   Gildardo Pounds, NP  ?dapagliflozin propanediol (FARXIGA) 10 MG TABS tablet TAKE 1 TABLET (10 MG TOTAL) BY MOUTH DAILY BEFORE BREAKFAST. 06/02/21 06/02/22  Gildardo Pounds, NP  ?fluticasone (FLONASE) 50 MCG/ACT nasal spray Place 2 sprays into both nostrils daily. 04/29/21   Gildardo Pounds, NP  ?glucose blood (ACCU-CHEK GUIDE) test strip Check blood sugar twice daily E11.8 02/10/22   [place] Rakes, MD  ?Insulin Glargine (BASAGLAR KWIKPEN) 100 UNIT/ML Inject 36 Units into the skin dai

## 2022-02-28 NOTE — ED Triage Notes (Signed)
Pt reports SOB since Friday.  L sided chest pain and L rib pain since turning over in bed yesterday.  Reports cough with white foamy phlegm. Hx of CHF.  Denies lower extremity edema.  Ran out of Lasix today. ?

## 2022-02-28 NOTE — ED Notes (Signed)
Pt placed on hospital bed

## 2022-02-28 NOTE — ED Provider Triage Note (Signed)
Emergency Medicine Provider Triage Evaluation Note ? ?Douglas [name] , a 46 y.o. male  was evaluated in triage.  Pt complains of shortness of breath.  Pertinent history includes CHF, hypertension, cardiomyopathy, ICD. ? ?Patient reports that over the last 4 days he has been experiencing gradually increasing shortness of breath, no clear inciting event.  Patient reports that yesterday he did experience some left-sided chest pain with this while lying in bed however chest pain is completely resolved however shortness of breath has continued.  He reports shortness of breath worsened with activity.  Patient feels that he may have increased fluid.  He reports associated cough with white phlegm. ?Review of Systems  ?Positive: Chest pain resolved.  Shortness of breath.  Cough with sputum production. ?Negative: Fever, chills, chest pain today.  Abdominal pain, nausea, vomiting, extremity swelling/color change or any additional concerns. ? ?Physical Exam  ?BP 111/73   Pulse 68   Temp 98.8 ?F (37.1 ?C) (Oral)   Resp (!) 24   SpO2 92%  ?Gen:   Awake, no distress   ?Resp:  Normal effort slightly diminished lung sounds bilaterally. ?MSK:   Moves extremities without difficulty  ?Other:  Heart regular rate and rhythm.  1+ bilateral lower extremity edema. ? ?Medical Decision Making  ?Medically screening exam initiated at 2:36 PM.  Appropriate orders placed.  Douglas [name] was informed that the remainder of the evaluation will be completed by another provider, this initial triage assessment does not replace that evaluation, and the importance of remaining in the ED until their evaluation is complete. ? ? ? ?Note: Portions of this report may have been transcribed using voice recognition software. Every effort was made to ensure accuracy; however, inadvertent computerized transcription errors may still be present. ? ?  ?Deliah Boston, PA-C ?02/28/22 1437 ? ?

## 2022-02-28 NOTE — ED Notes (Signed)
Attempted IV without success.

## 2022-02-28 NOTE — ED Notes (Signed)
K 2.6 ?Trop 143 ? ?Douglas [name], [place] notified. ?

## 2022-02-28 NOTE — ED Provider Notes (Signed)
?[facility] ?Provider Note ? ? ?CSN: [id number] ?Arrival date & time: 02/28/22  1356 ? ?  ? ?History ? ?Chief Complaint  ?Patient presents with  ?? Shortness of Breath  ?? Chest Pain  ? ? ?Douglas [name] is a 46 y.o. male. ? ? ?Shortness of Breath ?Associated symptoms: chest pain   ?Chest Pain ?Associated symptoms: shortness of breath   ? ?Patient is a 46 year old male with past medical history significant for continued cocaine use, nonischemic cardiomyopathy CHF with EF of 30-35 6/22 however has recovered from prior worse EF as low as 15%, he is a ICD as result of this low EF, he also has a history of hypertension, HLD, DM 2, obesity, tobacco abuse ? ?Seems that patient became homeless approximately 3 months ago has relapsed to using cocaine and last use 5 days ago.  He also is almost ran out of his torsemide he takes 100 mg daily and has been having his pills for a few days in order to get through the weekend states that he began feeling more short of breath and came to the ER for evaluation of this.  He states he did have one episode that was brief of chest pain but resolved and he denies any current chest pain. ? ?He states he has significant shortness of breath that is worse when he lays down he states he has had some swelling in his legs.  He states that it has been approximately symmetric. ? ?  ? ?Home Medications ?Prior to Admission medications   ?Medication Sig Start Date End Date Taking? Authorizing Provider  ?acetaminophen (TYLENOL) 500 MG tablet Take 1 tablet (500 mg total) by mouth every 6 (six) hours as needed. ?Patient taking differently: Take 500 mg by mouth every 6 (six) hours as needed for moderate pain. 04/29/21  Yes Gildardo Pounds, NP  ?aspirin EC 81 MG EC tablet Take 1 tablet (81 mg total) by mouth daily. 04/23/15  Yes Kelby Aline, MD  ?atorvastatin (LIPITOR) 40 MG tablet TAKE 1 TABLET (40 MG TOTAL) BY MOUTH DAILY. ?Patient taking differently: Take 40  mg by mouth daily. 02/10/22 02/10/23 Yes Charlott Rakes, MD  ?carvedilol (COREG) 12.5 MG tablet TAKE 1 TABLET (12.5 MG TOTAL) BY MOUTH 2 (TWO) TIMES DAILY WITH A MEAL. ?Patient taking differently: Take 12.5 mg by mouth 2 (two) times daily with a meal. 02/10/22 05/11/22 Yes Newlin, Enobong, MD  ?dapagliflozin propanediol (FARXIGA) 10 MG TABS tablet TAKE 1 TABLET (10 MG TOTAL) BY MOUTH DAILY BEFORE BREAKFAST. ?Patient taking differently: Take 10 mg by mouth daily before breakfast. 06/02/21 06/02/22 Yes Gildardo Pounds, NP  ?Insulin Glargine (BASAGLAR KWIKPEN) 100 UNIT/ML Inject 36 Units into the skin daily. must have office visit for refills. ?Patient taking differently: Inject 36 Units into the skin every evening. 01/13/22  Yes Charlott Rakes, MD  ?isosorbide-hydrALAZINE (BIDIL) 20-37.5 MG tablet Take 1 tablet by mouth 2 (two) times daily. 06/09/21  Yes Bensimhon, Shaune Pascal, MD  ?potassium chloride SA (KLOR-CON M) 20 MEQ tablet TAKE 2 TABLETS (40 MEQ TOTAL) BY MOUTH 2 (TWO) TIMES DAILY. ?Patient taking differently: 40 mEq 2 (two) times daily. 06/02/21 06/02/22 Yes Gildardo Pounds, NP  ?sacubitril-valsartan (ENTRESTO) 49-51 MG TAKE 1 TABLET BY MOUTH 2 (TWO) TIMES DAILY. 02/26/21 03/14/22 Yes Clegg, Amy D, NP  ?spironolactone (ALDACTONE) 25 MG tablet TAKE 1 TABLET (25 MG TOTAL) BY MOUTH DAILY. ?Patient taking differently: Take 25 mg by mouth daily. 02/10/22 05/11/22 Yes Newlin,  Enobong, MD  ?torsemide (DEMADEX) 100 MG tablet Take 1 tablet (100 mg total) by mouth daily. LAST REFILL NEEDS FOLLOW UP APPOINTMENT FOR ANYMORE REFILLS ?Patient taking differently: Take 100 mg by mouth daily. 02/10/22  Yes Bensimhon, Shaune Pascal, MD  ?Accu-Chek Softclix Lancets lancets Check blood sugar twice daily E11.8 04/03/21   Charlott Rakes, MD  ?Blood Glucose Calibration (ACCU-CHEK GUIDE CONTROL) LIQD 1 each by In Vitro route once as needed for up to 1 dose. 03/30/19   Gildardo Pounds, NP  ?Blood Glucose Monitoring Suppl (ACCU-CHEK GUIDE ME) w/Device  KIT Check blood sugar twice daily E11.8 04/03/21   Charlott Rakes, MD  ?Blood Pressure Monitor DEVI Please provide patient with insurance approved blood pressure monitor I10.0 09/02/21   Gildardo Pounds, NP  ?clotrimazole-betamethasone (LOTRISONE) cream Apply 1 application topically daily. ?Patient not taking: Reported on 02/28/2022 06/02/21   Gildardo Pounds, NP  ?fluticasone [facility]) 50 MCG/ACT nasal spray Place 2 sprays into both nostrils daily. ?Patient not taking: Reported on 02/28/2022 04/29/21   Gildardo Pounds, NP  ?glucose blood (ACCU-CHEK GUIDE) test strip Check blood sugar twice daily E11.8 02/10/22   Charlott Rakes, MD  ?Insulin Pen Needle 32G X 4 MM MISC INJECT 1 APPLICATION INTO THE SKIN DAILY. THE PATIENT IS INSULIN REQUIRING, ICD 10 CODE E11.9. THE PATIENT INJECTS 1 TIMES PER DAY. 06/02/21 06/02/22  Gildardo Pounds, NP  ?liraglutide (VICTOZA) 18 MG/3ML SOPN Inject 1.8 mg into the skin daily. ?Patient not taking: Reported on 02/28/2022 06/02/21 08/31/21  Gildardo Pounds, NP  ?sildenafil (VIAGRA) 100 MG tablet Take 1 tablet (100 mg total) by mouth daily as needed for erectile dysfunction. ?Patient not taking: Reported on 02/28/2022 04/24/21   Bensimhon, Shaune Pascal, MD  ?   ? ?Allergies    ?Bydureon [exenatide] and Penicillins   ? ?Review of Systems   ?Review of Systems  ?Respiratory:  Positive for shortness of breath.   ?Cardiovascular:  Positive for chest pain.  ? ?Physical Exam ?Updated Vital Signs ?BP 121/80 (BP Location: Right Arm)   Pulse 76   Temp 98.8 ?F (37.1 ?C) (Oral)   Resp (!) 26   SpO2 96%  ?Physical Exam ?Vitals and nursing note reviewed.  ?Constitutional:   ?   General: He is not in acute distress. ?   Appearance: He is obese.  ?   Comments: On 2 L nasal cannula due to hypoxia  ?HENT:  ?   Head: Normocephalic and atraumatic.  ?   Nose: Nose normal.  ?Eyes:  ?   General: No scleral icterus. ?Neck:  ?   Comments: Large neck, difficult to assess JVP ?Cardiovascular:  ?   Rate and Rhythm:  Normal rate and regular rhythm.  ?   Pulses: Normal pulses.  ?   Heart sounds: Normal heart sounds.  ?Pulmonary:  ?   Effort: Pulmonary effort is normal. No respiratory distress.  ?   Breath sounds: No wheezing.  ?Abdominal:  ?   Palpations: Abdomen is soft.  ?   Tenderness: There is no abdominal tenderness.  ?Musculoskeletal:  ?   Cervical back: Normal range of motion and neck supple.  ?   Right lower leg: Edema present.  ?   Left lower leg: Edema present.  ?Skin: ?   General: Skin is warm and dry.  ?   Capillary Refill: Capillary refill takes less than 2 seconds.  ?Neurological:  ?   Mental Status: He is alert. Mental status is at baseline.  ?  Psychiatric:     ?   Mood and Affect: Mood normal.     ?   Behavior: Behavior normal.  ? ? ?ED Results / Procedures / Treatments   ?Labs ?(all labs ordered are listed, but only abnormal results are displayed) ?Labs Reviewed  ?BASIC METABOLIC PANEL - Abnormal; Notable for the following components:  ?    Result Value  ? Potassium 2.6 (*)   ? Glucose, Bld 207 (*)   ? Creatinine, Ser 1.25 (*)   ? Calcium 8.4 (*)   ? All other components within normal limits  ?BRAIN NATRIURETIC PEPTIDE - Abnormal; Notable for the following components:  ? B Natriuretic Peptide 1,287.2 (*)   ? All other components within normal limits  ?MAGNESIUM - Abnormal; Notable for the following components:  ? Magnesium 1.6 (*)   ? All other components within normal limits  ?RAPID URINE DRUG SCREEN, HOSP PERFORMED - Abnormal; Notable for the following components:  ? Cocaine POSITIVE (*)   ? All other components within normal limits  ?BASIC METABOLIC PANEL - Abnormal; Notable for the following components:  ? Potassium 3.3 (*)   ? Glucose, Bld 198 (*)   ? Creatinine, Ser 1.43 (*)   ? Calcium 8.4 (*)   ? All other components within normal limits  ?HEMOGLOBIN A1C - Abnormal; Notable for the following components:  ? Hgb A1c MFr Bld 7.4 (*)   ? All other components within normal limits  ?TROPONIN I (HIGH SENSITIVITY)  - Abnormal; Notable for the following components:  ? Troponin I (High Sensitivity) 143 (*)   ? All other components within normal limits  ?TROPONIN I (HIGH SENSITIVITY) - Abnormal; Notable for the following co

## 2022-03-01 LAB — LIPID PANEL
Cholesterol: 191 mg/dL (ref 0–200)
HDL: 41 mg/dL (ref >40)
LDL Cholesterol: 133 mg/dL — ABNORMAL HIGH (ref 0–99)
Total CHOL/HDL Ratio: 4.7 RATIO
Triglycerides: 86 mg/dL (ref <150)
VLDL: 17 mg/dL (ref 0–40)

## 2022-03-01 LAB — CBG MONITORING, ED
Glucose-Capillary: 159 mg/dL — ABNORMAL HIGH (ref 70–99)
Glucose-Capillary: 145 mg/dL — ABNORMAL HIGH (ref 70–99)

## 2022-03-01 LAB — GLUCOSE, CAPILLARY
Glucose-Capillary: 166 mg/dL — ABNORMAL HIGH (ref 70–99)
Glucose-Capillary: 109 mg/dL — ABNORMAL HIGH (ref 70–99)

## 2022-03-01 LAB — BASIC METABOLIC PANEL
Anion gap: 10 (ref 5–15)
BUN: 16 mg/dL (ref 6–20)
CO2: 29 mmol/L (ref 22–32)
Calcium: 8.5 mg/dL — ABNORMAL LOW (ref 8.9–10.3)
Chloride: 102 mmol/L (ref 98–111)
Creatinine, Ser: 1.48 mg/dL — ABNORMAL HIGH (ref 0.61–1.24)
GFR, Estimated: 58 mL/min — ABNORMAL LOW (ref >60)
Glucose, Bld: 105 mg/dL — ABNORMAL HIGH (ref 70–99)
Potassium: 3.6 mmol/L (ref 3.5–5.1)
Sodium: 141 mmol/L (ref 135–145)

## 2022-03-01 LAB — MAGNESIUM: Magnesium: 2.1 mg/dL (ref 1.7–2.4)

## 2022-03-01 MED ORDER — ORAL CARE MOUTH RINSE
15.0000 mL | Freq: Two times a day (BID) | OROMUCOSAL | Status: DC
  Administered 2022-03-02 – 2022-03-05 (×6): 15 mL via OROMUCOSAL

## 2022-03-01 NOTE — ED Notes (Signed)
Checked patient blood sugar it was 159 patient is sitting up eating breakfast  ?

## 2022-03-01 NOTE — Progress Notes (Addendum)
?Heart and Vascular Care Navigation ? ?03/01/2022 ? ?Douglas [name] ?10-Aug-1976 ?[id number] ? ?Reason for Referral: Outpatient HF CSW referred to enroll in paramedicine program ?  ?Engaged with patient face to face for initial visit for Heart and Vascular Care Coordination. ?                                                                                                  ?Assessment:  Pt agreeable to enrollment in [employer]. ? ?Paramedicine Initial Assessment: ? ?Housing:  ?In what kind of housing do you live? House/apt/trailer/shelter?  Currently living in his car or couch surfing with friends/family.  Names an aunt who he has stayed with on and off. ? ?Was staying with his sister for the past 4 years but she moved into different housing about 2 months ago (end of Feb) and he was unable to move with her.  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 ? ?Do you rent/pay a mortgage/own? N/a ? ?Do you live with anyone? no ? ?Social:  ?What is your current marital status? Widowed- lost his wife 4 years ago ? ?Do you have any children? 4 children ? ?Do you have family or friends who live locally? His sister, aunt, and brother who is also a patient of ours. ? ?Income:  ?What is your current source of income?  Unemployment income- only active another 6-7 weeks ? ? ?Insurance:  ?Are you currently insured? medicaid ? ?Do you have prescription coverage? yes ? ?If no insurance, have you applied for coverage (Medicaid, disability, marketplace etc)?  Signed disability papers with inpatient TOC CSW ? ?Transportation:  ?Do you have transportation to your medical appointments? Has a car which is working ? ? Daily Health Needs: ?Do you have a working scale at home? Not at this time ? ?How do you manage your medications at home? Has been  hard to manage without stable housing ? ?Do you have any concerns with mobility at home? No can do ADLs independently ? ?Do you use any assistive devices at home or have PCS at home? no ? ?Do you have a PCP? Zelda Flemming ? ?Do you have any trouble reading or writing? no ? ?Are there any additional barriers you see to getting the care you need?  Has some issues with cocaine abuse- provided resources by inpatient TOC CSW- I can follow pt outpatient for further needs. ?                               ? ?HRT/VAS Care Coordination   ? ? Outpatient Care Team Community Paramedicine; Social Worker  ? Social Worker Name: Rosetta Posner, [facility], [phone]  ? Living arrangements for the past 2 months Homeless; No permanent address  ? Lives with: Self  ? Patient Current Insurance Coverage Medicaid  ? Patient Has Concern  With Paying Medical Bills No  ? Does Patient Have Prescription Coverage? Yes  ? Home Assistive Devices/Equipment Blood pressure cuff; CBG Meter  ? ?  ? ? ?Social History:                                                                             ?SDOH Screenings  ? ?Alcohol Screen: Not on file  ?Depression (PHQ2-9): Medium Risk  ? PHQ-2 Score: 6  ?Financial Resource Strain: High Risk  ? Difficulty of Paying Living Expenses: Hard  ?Food Insecurity: Not on file  ?Housing: High Risk  ? Last Housing Risk Score: 4  ?Physical Activity: Not on file  ?Social Connections: Not on file  ?Stress: Not on file  ?Tobacco Use: High Risk  ? Smoking Tobacco Use: Every Day  ? Smokeless Tobacco Use: Never  ? Passive Exposure: Not on file  ?Transportation Needs: No Transportation Needs  ? Lack of Transportation (Medical): No  ? Lack of Transportation (Non-Medical): No  ? ? ?SDOH Interventions: ?Financial Resources:  Corporate treasurer Interventions: Artist ?Gets unemployment at this time  ?Food Insecurity:   Gets food stamps- reports no concerns with getting sufficient food at this time  ?Housing Insecurity:   Housing Interventions: Other (Comment) (provided shelter resources) VISDPAT completed and sent to [facility]  ?Transportation:   Transportation Interventions: Intervention Not Indicated  ? ? ?Follow-up plan:   ? ?CSW will follow pt and refer to paramedicine for assignment once DC'd ? ?[name], LCSW ?Clinical Social Worker ?[facility] ?Desk#: [phone] ?Cell#: [phone] ? ? ? ?

## 2022-03-01 NOTE — TOC CAGE-AID Note (Addendum)
Transition of Care (TOC) - CAGE-AID Screening ? ? ?Patient Details  ?Name: Douglas [name] ?MRN: [id number] ?Date of Birth: 1976-04-08 ? ?Transition of Care (TOC) CM/SW Contact:    ?Amish Mintzer, LCSW ?Phone Number: ?03/01/2022, 3:45 PM ? ? 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 [name] agreed to substance use resources offered and housing/shelter resources. HF CSW completed a very brief SDOH with the patient who reported that he has been living out of his car and couch surfing with family since he lost his job and living situation. Patient reported they do have a PCP at [facility] and he can get to the pharmacy to pick up his medications. [name] agreed to filling out a disability application and CSW sent to the [facility]. ? ?CSW will continue to follow throughout discharge. ? ? ?CAGE-AID Screening: ?  ? ?Have You Ever Felt You Ought to Cut Down on Your Drinking or Drug Use?: Yes ?Have People Annoyed You By Critizing Your Drinking Or Drug Use?: No ?Have You Felt Bad Or Guilty About Your Drinking Or Drug Use?: No ?Have You Ever Had a Drink or Used Drugs First Thing In The Morning to Steady Your Nerves or to Get Rid of a Hangover?: No ?CAGE-AID Score: 1 ? ?Substance Abuse Education Offered: Yes ? ?Substance abuse interventions: Scientist, clinical (histocompatibility and immunogenetics), SDOH Screening ? ? ? ? ?[place], MSW, LCSW ?[phone] ?Heart Failure Social Worker  ? ?

## 2022-03-01 NOTE — TOC Initial Note (Addendum)
Transition of Care (TOC) - Initial/Assessment Note  ? ? ?Patient Details  ?Name: Douglas [name] ?MRN: [id number] ?Date of Birth: 1976-10-04 ? ?Transition of Care [facility]) CM/SW Contact:    ?Marcheta Grammes Rexene Alberts, RN ?Phone Number: [phone] ?03/01/2022, 4:38 PM ? ?Clinical Narrative:                 ?HF TOC CM spoke to pt at bedside. Pt states he was living with sister in her home but sister sold home to downsize. Pt states he is getting his unemployment which is less than a $1000 per month. He has financial obligations so stay in hotel is too expensive. Pt has list of shelters and number for [facility]. Pt inquired about [facility] assistance program for hotel. [place] # [phone]. States pt can come to [facility] from 8 am-3 pm on day of dc and she will arrange him an appt with CM to assist with housing and shelters. He can take shower, wash clothes and meal at [facility] from 8am - 3 pm. Has a car which he is living in his vehicle. Pt states he gets meds from [facility], and see PCP at clinic. Appt arranged at [facility] on May 17, at 230 pm.  ? ?Will have meds come up from [place] at dc.  ? ?Expected Discharge Plan: Home/Self Care ?Barriers to Discharge: Continued Medical Work up ? ? ?Patient Goals and CMS Choice ?Patient states their goals for this hospitalization and ongoing recovery are:: patient concerned about housing and not feeling well ?CMS Medicare.gov Compare Post Acute Care list provided to:: Patient ?Choice offered to / list presented to : Patient ? ?Expected Discharge Plan and Services ?Expected Discharge Plan: Home/Self Care ?In-house Referral: Clinical Social Work ?Discharge Planning Services: CM Consult ?  ?Living arrangements for the past 2 months: Homeless ?                ?  ?  ?  ?  ?  ?  ?  ?  ?  ?  ? ?Prior Living Arrangements/Services ?Living arrangements for the past 2 months: Homeless ?Lives with:: Self ?Patient language and need for interpreter reviewed:: Yes ?Do  you feel safe going back to the place where you live?: Yes      ?Need for Family Participation in Patient Care: No (Comment) ?Care giver support system in place?: No (comment) ?  ?Criminal Activity/Legal Involvement Pertinent to Current Situation/Hospitalization: No - Comment as needed ? ?Activities of Daily Living ?Home Assistive Devices/Equipment: Blood pressure cuff, CBG Meter ?ADL Screening (condition at time of admission) ?Patient's cognitive ability adequate to safely complete daily activities?: Yes ?Is the patient deaf or have difficulty hearing?: No ?Does the patient have difficulty seeing, even when wearing glasses/contacts?: No ?Does the patient have difficulty concentrating, remembering, or making decisions?: No ?Patient able to express need for assistance with ADLs?: Yes ?Does the patient have difficulty dressing or bathing?: No ?Independently performs ADLs?: Yes (appropriate for developmental age) ?Does the patient have difficulty walking or climbing stairs?: No ?Weakness of Legs: None ?Weakness of Arms/Hands: None ? ?Permission Sought/Granted ?Permission sought to share information with : Case Manager, Family Supports, PCP ?Permission granted to share information with : Yes, Verbal Permission Granted ? Share Information with NAME: Beatriz Chancellor ? Permission granted to share info w AGENCY: Homeless Shelter ? Permission granted to share info w Relationship: sister ? Permission granted to share info w Contact Information: [phone] ? ?Emotional  Assessment ?Appearance:: Appears stated age ?Attitude/Demeanor/Rapport: Engaged ?Affect (typically observed): Accepting ?Orientation: : Oriented to Self, Oriented to Place, Oriented to  Time, Oriented to Situation ?Alcohol / Substance Use: Illicit Drugs ?Psych Involvement: No (comment) ? ?Admission diagnosis:  Hypoxia [R09.02] ?Acute on chronic combined systolic and diastolic CHF (congestive heart failure) ([place]) [I50.43] ?Dyspnea, unspecified type [R06.00] ?Acute  on chronic congestive heart failure, unspecified heart failure type ([place]) [I50.9] ?Patient Active Problem List  ? Diagnosis Date Noted  ? Acute on chronic combined systolic and diastolic CHF (congestive heart failure) ([place]) 02/28/2022  ? Onychomycosis 01/16/2017  ? Depression, major, single episode 01/14/2017  ? Allergic rhinitis 04/28/2015  ? Chest pain 04/21/2015  ? Prolonged Q-T interval on ECG 01/31/2015  ? ICD (MDT) in place 01/29/2015  ? Obesity 01/29/2015  ? Healthcare maintenance 12/11/2014  ? Cocaine use 06/23/2012  ? Tobacco use 09/20/2011  ? VT (ventricular tachycardia) ([place]) 09/19/2011  ? ED (erectile dysfunction) 08/02/2011  ? Chronic combined systolic and diastolic congestive heart failure ([place]) 06/21/2011  ? Diabetes mellitus with stage 2 chronic kidney disease ([place]) 04/28/2009  ? Hyperlipidemia 04/28/2009  ? Essential hypertension 04/28/2009  ? ?PCP:  Gildardo Pounds, NP ?Pharmacy:   ?[facility] at [address], [address] ?[place] [address] ?Phone: [phone] Fax: [phone] ? ? ? ? ?Social Determinants of Health (SDOH) Interventions ?Financial Strain Interventions: Development worker, community ?Housing Interventions: Other (Comment) (provided shelter resources) ?Transportation Interventions: Intervention Not Indicated ? ?Readmission Risk Interventions ?   ? View : No data to display.  ?  ?  ?  ? ? ? ?

## 2022-03-01 NOTE — Consult Note (Addendum)
?  ?Advanced Heart Failure Team Consult Note ? ? ?Primary Physician: Gildardo Pounds, NP ?HF -Cardiologist:  Dr Haroldine Laws  ? ?Reason for Consultation: A/C HFrEF ? ?HPI:   ? ?Douglas [name] is seen today for evaluation of A/C HFrEF at the request of Dr  ? ?Douglas [name] is a 46 year old with h/o substance abuse, tobacco abuse, obesity, VT, medtronic ICD, NICM, chronic HFrEF.He has been followed > 10 years in the HF clinic. ? ?He was last seen in the HF clinic in June 22022. Echo EF 30-35%. Lost his job in December. Living in his car for the last few months. Has a hard time getting food. Ran out torsemide about 7 days ago. Missed the last few HF appointments. Using cocaine again. Smoking 1 pack cigarettes every 2-3 days .  ? ?Presented to the ED with increased dyspnea. CXR with vascular congestion. BNP > 1000, K < 3, HS Trop 143>142, Hgb A1C 7.4, SARS2 negative, HIV NR, UDS + cocaine.  Started on IV lasix. Given 2 grams mag.  ? ?Remains short of breath with exertion.  ? ?Cardiac Testing  ?ECHO 06/25/13 EF 20-25% ?ECHO 05/01/14 EF 35-40%, moderately dilated LV, mild LVH, diffuse hypokinesis ?Echo 6/16 EF 25-30%, mild LVH, moderately dilated LV.  ?ECHO 09/23/2016 EF 25-30%. ?ECHO 08/19/2017 EF 40-45% grade IDD ?ECHO 04/2021 EF 30-35%  ? ?Review of Systems: [y] = yes, [ ]  = no  ? ?General: Weight gain [ ] ; Weight loss [ ] ; Anorexia [ ] ; Fatigue [ Y]; Fever [ ] ; Chills [ ] ; Weakness [Y ]  ?Cardiac: Chest pain/pressure [ ] ; Resting SOB [ ] ; Exertional SOB [Y ]; Orthopnea Jazmín.Cullens ]; Pedal Edema [Y ]; Palpitations [ ] ; Syncope [ ] ; Presyncope [ ] ; Paroxysmal nocturnal dyspnea[ ]   ?Pulmonary: Cough [ ] ; Wheezing[ ] ; Hemoptysis[ ] ; Sputum [ ] ; Snoring [ ]   ?GI: Vomiting[ ] ; Dysphagia[ ] ; Melena[ ] ; Hematochezia [ ] ; Heartburn[ ] ; Abdominal pain [ ] ; Constipation [ ] ; Diarrhea [ ] ; BRBPR [ ]   ?GU: Hematuria[ ] ; Dysuria [ ] ; Nocturia[ ]   ?Vascular: Pain in legs with walking [ ] ; Pain in feet with lying flat [ ] ; Non-healing sores [ ] ;  Stroke [ ] ; TIA [ ] ; Slurred speech [ ] ;  ?Neuro: Headaches[ ] ; Vertigo[ ] ; Seizures[ ] ; Paresthesias[ ] ;Blurred vision [ ] ; Diplopia [ ] ; Vision changes [ ]   ?Ortho/Skin: Arthritis [ ] ; Joint pain [Y]; Muscle pain [ ] ; Joint swelling [ ] ; Back Pain [ Y]; Rash [ ]   ?Psych: Depression[ Y]; Anxiety[ ]   ?Heme: Bleeding problems [ ] ; Clotting disorders [ ] ; Anemia [ ]   ?Endocrine: Diabetes [ Y]; Thyroid dysfunction[ ]  ? ?Home Medications ?Prior to Admission medications   ?Medication Sig Start Date End Date Taking? Authorizing Provider  ?acetaminophen (TYLENOL) 500 MG tablet Take 1 tablet (500 mg total) by mouth every 6 (six) hours as needed. ?Patient taking differently: Take 500 mg by mouth every 6 (six) hours as needed for moderate pain. 04/29/21  Yes Gildardo Pounds, NP  ?aspirin EC 81 MG EC tablet Take 1 tablet (81 mg total) by mouth daily. 04/23/15  Yes Kelby Aline, MD  ?atorvastatin (LIPITOR) 40 MG tablet TAKE 1 TABLET (40 MG TOTAL) BY MOUTH DAILY. ?Patient taking differently: Take 40 mg by mouth daily. 02/10/22 02/10/23 Yes Charlott Rakes, MD  ?carvedilol (COREG) 12.5 MG tablet TAKE 1 TABLET (12.5 MG TOTAL) BY MOUTH 2 (TWO) TIMES DAILY WITH A MEAL. ?Patient taking differently: Take 12.5 mg by  mouth 2 (two) times daily with a meal. 02/10/22 05/11/22 Yes Newlin, Enobong, MD  ?dapagliflozin propanediol (FARXIGA) 10 MG TABS tablet TAKE 1 TABLET (10 MG TOTAL) BY MOUTH DAILY BEFORE BREAKFAST. ?Patient taking differently: Take 10 mg by mouth daily before breakfast. 06/02/21 06/02/22 Yes Gildardo Pounds, NP  ?Insulin Glargine (BASAGLAR KWIKPEN) 100 UNIT/ML Inject 36 Units into the skin daily. must have office visit for refills. ?Patient taking differently: Inject 36 Units into the skin every evening. 01/13/22  Yes Charlott Rakes, MD  ?isosorbide-hydrALAZINE (BIDIL) 20-37.5 MG tablet Take 1 tablet by mouth 2 (two) times daily. 06/09/21  Yes Sherron Mapp, Shaune Pascal, MD  ?potassium chloride SA (KLOR-CON M) 20 MEQ tablet TAKE 2  TABLETS (40 MEQ TOTAL) BY MOUTH 2 (TWO) TIMES DAILY. ?Patient taking differently: 40 mEq 2 (two) times daily. 06/02/21 06/02/22 Yes Gildardo Pounds, NP  ?sacubitril-valsartan (ENTRESTO) 49-51 MG TAKE 1 TABLET BY MOUTH 2 (TWO) TIMES DAILY. 02/26/21 03/14/22 Yes Clegg, Amy D, NP  ?spironolactone (ALDACTONE) 25 MG tablet TAKE 1 TABLET (25 MG TOTAL) BY MOUTH DAILY. ?Patient taking differently: Take 25 mg by mouth daily. 02/10/22 05/11/22 Yes Charlott Rakes, MD  ?torsemide (DEMADEX) 100 MG tablet Take 1 tablet (100 mg total) by mouth daily. LAST REFILL NEEDS FOLLOW UP APPOINTMENT FOR ANYMORE REFILLS ?Patient taking differently: Take 100 mg by mouth daily. 02/10/22  Yes Allante Whitmire, Shaune Pascal, MD  ?Accu-Chek Softclix Lancets lancets Check blood sugar twice daily E11.8 04/03/21   Charlott Rakes, MD  ?Blood Glucose Calibration (ACCU-CHEK GUIDE CONTROL) LIQD 1 each by In Vitro route once as needed for up to 1 dose. 03/30/19   Gildardo Pounds, NP  ?Blood Glucose Monitoring Suppl (ACCU-CHEK GUIDE ME) w/Device KIT Check blood sugar twice daily E11.8 04/03/21   Charlott Rakes, MD  ?Blood Pressure Monitor DEVI Please provide patient with insurance approved blood pressure monitor I10.0 09/02/21   Gildardo Pounds, NP  ?clotrimazole-betamethasone (LOTRISONE) cream Apply 1 application topically daily. ?Patient not taking: Reported on 02/28/2022 06/02/21   Gildardo Pounds, NP  ?fluticasone [facility]) 50 MCG/ACT nasal spray Place 2 sprays into both nostrils daily. ?Patient not taking: Reported on 02/28/2022 04/29/21   Gildardo Pounds, NP  ?glucose blood (ACCU-CHEK GUIDE) test strip Check blood sugar twice daily E11.8 02/10/22   Charlott Rakes, MD  ?Insulin Pen Needle 32G X 4 MM MISC INJECT 1 APPLICATION INTO THE SKIN DAILY. THE PATIENT IS INSULIN REQUIRING, ICD 10 CODE E11.9. THE PATIENT INJECTS 1 TIMES PER DAY. 06/02/21 06/02/22  Gildardo Pounds, NP  ?liraglutide (VICTOZA) 18 MG/3ML SOPN Inject 1.8 mg into the skin daily. ?Patient not taking:  Reported on 02/28/2022 06/02/21 08/31/21  Gildardo Pounds, NP  ?sildenafil (VIAGRA) 100 MG tablet Take 1 tablet (100 mg total) by mouth daily as needed for erectile dysfunction. ?Patient not taking: Reported on 02/28/2022 04/24/21   Tezra Mahr, Shaune Pascal, MD  ? ? ?Past Medical History: ?Past Medical History:  ?Diagnosis Date  ? Depression, major, single episode 01/14/2017  ? Diabetes mellitus ([place])   ? ED (erectile dysfunction)   ? History of syncope 01/29/2015  ? Hypertension   ? ICD (implantable cardioverter-defibrillator) discharge 11/30/2014  ? On 11/30/14. Asymptomatic.   ? Nonischemic cardiomyopathy ([place])   ? a.  echo 4/06: EF 30%, mild to mod Douglas, mild RAE, inf HK, lat HK , ant HK;    b.  cath 4/06: no CAD, EF 20-25%  ? NSVT (nonsustained ventricular tachycardia) ([place])   ? Obesity   ?  Systolic CHF, chronic ([place])   ? EF 11/17 25-30%, s/p ICD  ? ? ?Past Surgical History: ?Past Surgical History:  ?Procedure Laterality Date  ? CARDIAC CATHETERIZATION  09/2011; 02/2013; 04/2013  ? CARDIAC DEFIBRILLATOR PLACEMENT  08/23/2013  ? IMPLANTABLE CARDIOVERTER DEFIBRILLATOR IMPLANT N/A 08/23/2013  ? Procedure: IMPLANTABLE CARDIOVERTER DEFIBRILLATOR IMPLANT;  Surgeon: Deboraha Sprang, MD;  Location: [facility] CATH LAB;  Service: Cardiovascular;  Laterality: N/A;  ? LEFT AND RIGHT HEART CATHETERIZATION WITH CORONARY ANGIOGRAM N/A 09/20/2011  ? Procedure: LEFT AND RIGHT HEART CATHETERIZATION WITH CORONARY ANGIOGRAM;  Surgeon: Jolaine Artist, MD;  Location: [facility] CATH LAB;  Service: Cardiovascular;  Laterality: N/A;  ? MULTIPLE EXTRACTIONS WITH ALVEOLOPLASTY N/A 01/26/2013  ? Procedure:  EXTRACION  TOOTH # 19 WITH ALVEOLOPLASTY;  Surgeon: Lenn Cal, DDS;  Location: [place];  Service: Oral Surgery;  Laterality: N/A;  ? RIGHT HEART CATHETERIZATION N/A 02/22/2013  ? Procedure: RIGHT HEART CATH;  Surgeon: Jolaine Artist, MD;  Location: [facility] CATH LAB;  Service: Cardiovascular;  Laterality: N/A;  ? RIGHT HEART CATHETERIZATION N/A 05/03/2013  ?  Procedure: RIGHT HEART CATH;  Surgeon: Jolaine Artist, MD;  Location: [facility] CATH LAB;  Service: Cardiovascular;  Laterality: N/A;  ? ? ?Family History: ?Family History  ?Problem Relation Age of Onset  ? Coronary a

## 2022-03-02 ENCOUNTER — Inpatient Hospital Stay (HOSPITAL_COMMUNITY): Payer: Medicaid Other

## 2022-03-02 ENCOUNTER — Other Ambulatory Visit (HOSPITAL_COMMUNITY): Payer: Self-pay

## 2022-03-02 DIAGNOSIS — I5021 Acute systolic (congestive) heart failure: Secondary | ICD-10-CM | POA: Diagnosis not present

## 2022-03-02 LAB — ECHOCARDIOGRAM COMPLETE
Area-P 1/2: 4.6 cm2
Height: 76 in
MV M vel: 4.5 m/s
MV Peak grad: 81 mmHg
Radius: 0.4 cm
S' Lateral: 6.6 cm
Single Plane A4C EF: 32.4 %
Weight: 4700.8 oz

## 2022-03-02 LAB — GLUCOSE, CAPILLARY
Glucose-Capillary: 92 mg/dL (ref 70–99)
Glucose-Capillary: 124 mg/dL — ABNORMAL HIGH (ref 70–99)
Glucose-Capillary: 141 mg/dL — ABNORMAL HIGH (ref 70–99)
Glucose-Capillary: 185 mg/dL — ABNORMAL HIGH (ref 70–99)

## 2022-03-02 LAB — BASIC METABOLIC PANEL
Anion gap: 8 (ref 5–15)
BUN: 20 mg/dL (ref 6–20)
CO2: 27 mmol/L (ref 22–32)
Calcium: 8.2 mg/dL — ABNORMAL LOW (ref 8.9–10.3)
Chloride: 104 mmol/L (ref 98–111)
Creatinine, Ser: 1.38 mg/dL — ABNORMAL HIGH (ref 0.61–1.24)
GFR, Estimated: >60 mL/min (ref >60)
Glucose, Bld: 105 mg/dL — ABNORMAL HIGH (ref 70–99)
Potassium: 2.9 mmol/L — ABNORMAL LOW (ref 3.5–5.1)
Sodium: 139 mmol/L (ref 135–145)

## 2022-03-02 LAB — POTASSIUM: Potassium: 3.1 mmol/L — ABNORMAL LOW (ref 3.5–5.1)

## 2022-03-02 LAB — MAGNESIUM: Magnesium: 1.9 mg/dL (ref 1.7–2.4)

## 2022-03-02 MED ORDER — MAGNESIUM SULFATE 2 GM/50ML IV SOLN
2.0000 g | Freq: Once | INTRAVENOUS | Status: AC
  Administered 2022-03-02: 2 g via INTRAVENOUS
  Filled 2022-03-02: qty 50

## 2022-03-02 MED ORDER — GUAIFENESIN-DM 100-10 MG/5ML PO SYRP
5.0000 mL | ORAL_SOLUTION | ORAL | Status: DC | PRN
  Administered 2022-03-02: 5 mL via ORAL
  Filled 2022-03-02: qty 5

## 2022-03-02 MED ORDER — POTASSIUM CHLORIDE CRYS ER 20 MEQ PO TBCR
60.0000 meq | EXTENDED_RELEASE_TABLET | Freq: Once | ORAL | Status: AC
  Administered 2022-03-02: 60 meq via ORAL
  Filled 2022-03-02: qty 3

## 2022-03-02 MED ORDER — POTASSIUM CHLORIDE CRYS ER 20 MEQ PO TBCR
40.0000 meq | EXTENDED_RELEASE_TABLET | Freq: Two times a day (BID) | ORAL | Status: DC
Start: 2022-03-02 — End: 2022-03-05
  Administered 2022-03-02 – 2022-03-05 (×7): 40 meq via ORAL
  Filled 2022-03-02 (×7): qty 2

## 2022-03-02 NOTE — Progress Notes (Addendum)
? ? Advanced Heart Failure Rounding Note ? ?PCP-Cardiologist: None  ? ?Subjective:   ?Admit weight 300-->293  ? ?4/17 Diuresing with IV lasix. Brisk diuresis noted.  ? ? ?Feeling better. Remains SOB with exertion.  ? ? ?Objective:   ?Weight Range: ?133.3 kg ?Body mass index is 35.76 kg/m?.  ? ?Vital Signs:   ?Temp:  [97.6 ?F (36.4 ?C)-98.4 ?F (36.9 ?C)] 97.9 ?F (36.6 ?C) (04/18 0500) ?Pulse Rate:  [63-93] 69 (04/18 0500) ?Resp:  [15-38] 19 (04/18 0500) ?BP: (94-142)/(62-119) 113/71 (04/18 0500) ?SpO2:  [95 %-100 %] 95 % (04/18 0500) ?Weight:  [133.3 kg-136.2 kg] 133.3 kg (04/18 0500) ?Last BM Date : 03/01/22 ? ?Weight change: ?Filed Weights  ? 03/01/22 1329 03/02/22 0500  ?Weight: (!) 136.2 kg 133.3 kg  ? ? ?Intake/Output:  ? ?Intake/Output Summary (Last 24 hours) at 03/02/2022 0741 ?Last data filed at 03/02/2022 0600 ?Gross per 24 hour  ?Intake 940 ml  ?Output 3925 ml  ?Net -2985 ml  ?  ? ? ?Physical Exam  ?  ?General:  Sitting on the side of the bed.  No resp difficulty ?HEENT: Normal ?Neck: Supple. JVP 9-10. Carotids 2+ bilat; no bruits. No lymphadenopathy or thyromegaly appreciated. ?Cor: PMI nondisplaced. Regular rate & rhythm. No rubs, gallops or murmurs. ?Lungs: Clear ?Abdomen: Soft, nontender, nondistended. No hepatosplenomegaly. No bruits or masses. Good bowel sounds. ?Extremities: No cyanosis, clubbing, rash, R and LLE trace edema ?Neuro: Alert & orientedx3, cranial nerves grossly intact. moves all 4 extremities w/o difficulty. Affect pleasant ? ? ?Telemetry  ? ?SR with occasional PVCs 3-10 per hour. 1 NSVT this morning.  ? ?EKG  ? N/A ?Labs  ?  ?CBC ?Recent Labs  ?  02/28/22 ?1431  ?WBC 7.6  ?HGB 15.6  ?HCT 43.9  ?MCV 85.1  ?PLT 224  ? ?Basic Metabolic Panel ?Recent Labs  ?  03/01/22 ?0717 03/01/22 ?0722 03/02/22 ?[id number]  ?NA  --  141 139  ?K  --  3.6 2.9*  ?CL  --  102 104  ?CO2  --  29 27  ?GLUCOSE  --  105* 105*  ?BUN  --  16 20  ?CREATININE  --  1.48* 1.38*  ?CALCIUM  --  8.5* 8.2*  ?MG 2.1  --  1.9   ? ?Liver Function Tests ?No results for input(s): AST, ALT, ALKPHOS, BILITOT, PROT, ALBUMIN in the last 72 hours. ?No results for input(s): LIPASE, AMYLASE in the last 72 hours. ?Cardiac Enzymes ?No results for input(s): CKTOTAL, CKMB, CKMBINDEX, TROPONINI in the last 72 hours. ? ?BNP: ?BNP (last 3 results) ?Recent Labs  ?  04/24/21 ?1534 02/28/22 ?1436  ?BNP 92.4 1,287.2*  ? ? ?ProBNP (last 3 results) ?No results for input(s): PROBNP in the last 8760 hours. ? ? ?D-Dimer ?No results for input(s): DDIMER in the last 72 hours. ?Hemoglobin A1C ?Recent Labs  ?  02/28/22 ?1431  ?HGBA1C 7.4*  ? ?Fasting Lipid Panel ?Recent Labs  ?  03/01/22 ?0717  ?CHOL 191  ?HDL 41  ?LDLCALC 133*  ?TRIG 86  ?CHOLHDL 4.7  ? ?Thyroid Function Tests ?No results for input(s): TSH, T4TOTAL, T3FREE, THYROIDAB in the last 72 hours. ? ?Invalid input(s): FREET3 ? ?Other results: ? ? ?Imaging  ? ? ?No results found. ? ? ?Medications:   ? ? ?Scheduled Medications: ? aspirin EC  81 mg Oral Daily  ? atorvastatin  40 mg Oral Daily  ? carvedilol  12.5 mg Oral BID WC  ? dapagliflozin propanediol  10 mg Oral  Daily  ? heparin  5,000 Units Subcutaneous Q8H  ? insulin aspart  0-15 Units Subcutaneous TID WC  ? insulin glargine-yfgn  18 Units Subcutaneous QHS  ? mouth rinse  15 mL Mouth Rinse BID  ? sacubitril-valsartan  1 tablet Oral BID  ? spironolactone  25 mg Oral Daily  ? ? ?Infusions: ? furosemide 120 mg (03/01/22 1800)  ? ? ?PRN Medications: ?acetaminophen, nitroGLYCERIN ? ? ? ?Patient Profile  ? ?Mr Bomkamp is a 46 year old with h/o substance abuse, tobacco abuse, obesity, VT, medtronic ICD, NICM, chronic HFrEF.He has been followed > 10 years in the HF clinic. ?  ?Admitted A/C HFrEF in the setting inability to obtain medications ? ?Assessment/Plan  ? ?1. HFrEF ?-NICM by cath in 2006. Medtronic ICD. Last Echo [id number] EF 30-35%.  ?-Admitted with volume overload and has diuresing with IV lasix. BNP > 1200.  ?- Volume status improving. Needs one more day  of IV lasix.  ?- Supp K  ?- Can continue coreg, entresto, farxiga, and spiro. ?- Consult cardiac rehab.   ?  ?2. NSVT ?-Supp K and Mag ?- Check BMET and Mag in am. ?-Continue coreg at current dose.  ?  ?3. DMII ?-SSI  ?-Hgb A1C 7.4  ?- Continue SGLT2I ?  ?4. Hyperlipidemia ?-LDL 131 ?-Continue atorvastatin. ?  ?5. Substance Abuse ?-UDS + Cocaine ?-Discussed cessation  ?  ?6.Tobacco Abuse ?-Discussed he needs to stop.  ?  ?7. SDOH ?-Homeless. Consult TOC SW.  ?-He has been referred to HF Paramedicine.  ?  ? ? ?Length of Stay: 2 ? ?Darrick Grinder, NP  ?03/02/2022, 7:41 AM ? ?Advanced Heart Failure Team ?Pager [phone] (M-F; 7a - 5p)  ?Please contact [facility] for night-coverage after hours (5p -7a ) and weekends on [email or web address] ? ? ?Patient seen and examined with the above-signed Advanced Practice Provider and/or Housestaff. I personally reviewed laboratory data, imaging studies and relevant notes. I independently examined the patient and formulated the important aspects of the plan. I have edited the note to reflect any of my changes or salient points. I have personally discussed the plan with the patient and/or family. ? ?Diuresing briskly. Weight down 7 pounds. Breathing better but still with some orthopnea. Renal function stable ? ?Echo unchanged EF 25-30% Personally reviewed ? ?General: Sitting up in bed No resp difficulty ?HEENT: normal ?Neck: supple. JVP 7-8 Carotids 2+ bilat; no bruits. No lymphadenopathy or thryomegaly appreciated. ?Cor: PMI laterally displaced. Regular rate & rhythm. No rubs, gallops or murmurs. ?Lungs: clear ?Abdomen: soft, nontender, nondistended. No hepatosplenomegaly. No bruits or masses. Good bowel sounds. ?Extremities: no cyanosis, clubbing, rash, 1+ edema ?Neuro: alert & orientedx3, cranial nerves grossly intact. moves all 4 extremities w/o difficulty. Affect pleasant ? ?Diuresing well. Continue IV lasix. If remains symptomatic after diuresis will need repeat R/L cath.  ? ?Glori Bickers, MD  ?9:14 PM ? ? ? ?

## 2022-03-02 NOTE — Plan of Care (Signed)
?  Problem: Education: ?Goal: Ability to demonstrate management of disease process will improve ?Outcome: Progressing ?  ?Problem: Activity: ?Goal: Capacity to carry out activities will improve ?Outcome: Progressing ?  ?Problem: Cardiac: ?Goal: Ability to achieve and maintain adequate cardiopulmonary perfusion will improve ?Outcome: Progressing ?  ?

## 2022-03-02 NOTE — Consult Note (Signed)
? ?  [facility] CM Inpatient Consult ? ? ?03/02/2022 ? ?Douglas [name] ?1976-01-12 ?[id number] ? ?Managed Medicaid ? ?Primary Care Provider:   ? ?Patient screened for post hospitalization needs with the Managed Medicaid team. Patient discussed in progression meeting.  ? ? ?Plan:  Continue to follow progress and disposition to assess for post hospital care management needs.   ? ?For questions contact:  ? ?Charlesetta Shanks, RN BSN CCM ?[employer] Liaison ? [phone] business mobile phone ?Toll free office [phone]  ?Fax number: [phone] ?[email or web address] ?[email or web address]  ? ? ? ?

## 2022-03-02 NOTE — Progress Notes (Signed)
?  Echocardiogram ?2D Echocardiogram has been performed. ? ?Douglas [name] ?03/02/2022, 10:50 AM ?

## 2022-03-02 NOTE — Progress Notes (Signed)
Nutrition Brief Note ? ?Patient identified on the Malnutrition Screening Tool (MST) Report. ?Patient reports recent weight loss r/t changes in his eating habits and fluids. From review of usual weights, no significant weight changes noted. He is happy about the weight loss. He lives in his car and has been eating more snack foods (chips, crackers, small sandwiches) than usual. He is aware of his low sodium diet and has tried to stay away from high sodium foods. He has had low sodium diet education in the past and reports no questions at this time. He states that he has friends and family that provide him with food. He has talked to the CSW about the [facility]. He currently has a great appetite and has been eating well since admission.  ? ?Nutrition Focused Physical Exam ?Flowsheet Row Most Recent Value  ?Orbital Region No depletion  ?Upper Arm Region No depletion  ?Thoracic and Lumbar Region No depletion  ?Buccal Region No depletion  ?Temple Region No depletion  ?Clavicle Bone Region No depletion  ?Clavicle and Acromion Bone Region No depletion  ?Scapular Bone Region No depletion  ?Dorsal Hand No depletion  ?Patellar Region No depletion  ?Anterior Thigh Region No depletion  ?Posterior Calf Region No depletion  ?Edema (RD Assessment) Mild  ?Hair Reviewed  ?Eyes Reviewed  ?Mouth Reviewed  ?Skin Reviewed  ?Nails Reviewed  ? ?  ? ? ? ?Body mass index is 35.76 kg/m?Marland Kitchen Patient meets criteria for obesity based on current BMI.  ? ?Current diet order is heart healthy, carbohydrate modified, patient is consuming approximately 100% of meals at this time. Labs and medications reviewed.  ? ?No nutrition interventions warranted at this time. If nutrition issues arise, please consult RD.  ? ?Gabriel Rainwater RD, LDN, CNSC ?Please refer to Amion for contact information.                                                       ? ? ?

## 2022-03-03 ENCOUNTER — Encounter (HOSPITAL_COMMUNITY)
Admission: EM | Disposition: A | Discharge status: Home / Self Care | Payer: Self-pay | Source: Home / Self Care | Attending: Internal Medicine

## 2022-03-03 DIAGNOSIS — I2510 Atherosclerotic heart disease of native coronary artery without angina pectoris: Secondary | ICD-10-CM

## 2022-03-03 DIAGNOSIS — I471 Supraventricular tachycardia: Secondary | ICD-10-CM

## 2022-03-03 HISTORY — PX: RIGHT/LEFT HEART CATH AND CORONARY ANGIOGRAPHY: CATH118266

## 2022-03-03 LAB — CBC
HCT: 43.8 % (ref 39.0–52.0)
Hemoglobin: 14.4 g/dL (ref 13.0–17.0)
MCH: 28.3 pg (ref 26.0–34.0)
MCHC: 32.9 g/dL (ref 30.0–36.0)
MCV: 86.2 fL (ref 80.0–100.0)
Platelets: 235 10*3/uL (ref 150–400)
RBC: 5.08 MIL/uL (ref 4.22–5.81)
RDW: 15.2 % (ref 11.5–15.5)
WBC: 7.1 10*3/uL (ref 4.0–10.5)
nRBC: 0 % (ref 0.0–0.2)

## 2022-03-03 LAB — POCT I-STAT EG7
Acid-Base Excess: 5 mmol/L — ABNORMAL HIGH (ref 0.0–2.0)
Bicarbonate: 29.8 mmol/L — ABNORMAL HIGH (ref 20.0–28.0)
Calcium, Ion: 1.18 mmol/L (ref 1.15–1.40)
HCT: 43 % (ref 39.0–52.0)
Hemoglobin: 14.6 g/dL (ref 13.0–17.0)
O2 Saturation: 65 %
Potassium: 4.1 mmol/L (ref 3.5–5.1)
Sodium: 142 mmol/L (ref 135–145)
TCO2: 31 mmol/L (ref 22–32)
pCO2, Ven: 44.3 mmHg (ref 44–60)
pH, Ven: 7.436 — ABNORMAL HIGH (ref 7.25–7.43)
pO2, Ven: 33 mmHg (ref 32–45)

## 2022-03-03 LAB — CREATININE, SERUM
Creatinine, Ser: 1.41 mg/dL — ABNORMAL HIGH (ref 0.61–1.24)
GFR, Estimated: >60 mL/min (ref >60)

## 2022-03-03 LAB — BASIC METABOLIC PANEL
Anion gap: 8 (ref 5–15)
BUN: 20 mg/dL (ref 6–20)
CO2: 26 mmol/L (ref 22–32)
Calcium: 8.6 mg/dL — ABNORMAL LOW (ref 8.9–10.3)
Chloride: 106 mmol/L (ref 98–111)
Creatinine, Ser: 1.32 mg/dL — ABNORMAL HIGH (ref 0.61–1.24)
GFR, Estimated: >60 mL/min (ref >60)
Glucose, Bld: 135 mg/dL — ABNORMAL HIGH (ref 70–99)
Potassium: 3.5 mmol/L (ref 3.5–5.1)
Sodium: 140 mmol/L (ref 135–145)

## 2022-03-03 LAB — POCT I-STAT 7, (LYTES, BLD GAS, ICA,H+H)
Acid-Base Excess: 3 mmol/L — ABNORMAL HIGH (ref 0.0–2.0)
Bicarbonate: 26.5 mmol/L (ref 20.0–28.0)
Calcium, Ion: 1.04 mmol/L — ABNORMAL LOW (ref 1.15–1.40)
HCT: 41 % (ref 39.0–52.0)
Hemoglobin: 13.9 g/dL (ref 13.0–17.0)
O2 Saturation: 92 %
Potassium: 3.8 mmol/L (ref 3.5–5.1)
Sodium: 144 mmol/L (ref 135–145)
TCO2: 28 mmol/L (ref 22–32)
pCO2 arterial: 36.7 mmHg (ref 32–48)
pH, Arterial: 7.466 — ABNORMAL HIGH (ref 7.35–7.45)
pO2, Arterial: 60 mmHg — ABNORMAL LOW (ref 83–108)

## 2022-03-03 LAB — GLUCOSE, CAPILLARY
Glucose-Capillary: 135 mg/dL — ABNORMAL HIGH (ref 70–99)
Glucose-Capillary: 138 mg/dL — ABNORMAL HIGH (ref 70–99)
Glucose-Capillary: 156 mg/dL — ABNORMAL HIGH (ref 70–99)
Glucose-Capillary: 190 mg/dL — ABNORMAL HIGH (ref 70–99)

## 2022-03-03 LAB — MAGNESIUM: Magnesium: 2.2 mg/dL (ref 1.7–2.4)

## 2022-03-03 SURGERY — RIGHT/LEFT HEART CATH AND CORONARY ANGIOGRAPHY
Anesthesia: LOCAL

## 2022-03-03 MED ORDER — HEPARIN (PORCINE) IN NACL 1000-0.9 UT/500ML-% IV SOLN
INTRAVENOUS | Status: AC
  Filled 2022-03-03: qty 1000

## 2022-03-03 MED ORDER — HEPARIN SODIUM (PORCINE) 1000 UNIT/ML IJ SOLN
INTRAMUSCULAR | Status: AC
  Filled 2022-03-03: qty 10

## 2022-03-03 MED ORDER — CARVEDILOL 3.125 MG PO TABS
6.2500 mg | ORAL_TABLET | Freq: Two times a day (BID) | ORAL | Status: DC
  Administered 2022-03-04 – 2022-03-05 (×3): 6.25 mg via ORAL
  Filled 2022-03-03 (×4): qty 2

## 2022-03-03 MED ORDER — LABETALOL HCL 5 MG/ML IV SOLN: 10.0000 mg | INTRAVENOUS | Status: AC | PRN

## 2022-03-03 MED ORDER — SODIUM CHLORIDE 0.9 % IV SOLN: 250.0000 mL | INTRAVENOUS | Status: DC | PRN

## 2022-03-03 MED ORDER — SODIUM CHLORIDE 0.9% FLUSH
3.0000 mL | Freq: Two times a day (BID) | INTRAVENOUS | Status: DC
  Administered 2022-03-03 – 2022-03-05 (×4): 3 mL via INTRAVENOUS

## 2022-03-03 MED ORDER — ONDANSETRON HCL 4 MG/2ML IJ SOLN
INTRAMUSCULAR | Status: AC
Start: 2022-03-03 — End: ?
  Filled 2022-03-03: qty 2

## 2022-03-03 MED ORDER — LIDOCAINE HCL (PF) 1 % IJ SOLN
INTRAMUSCULAR | Status: DC | PRN
  Administered 2022-03-03 (×2): 2 mL

## 2022-03-03 MED ORDER — HEPARIN SODIUM (PORCINE) 1000 UNIT/ML IJ SOLN
INTRAMUSCULAR | Status: DC | PRN
  Administered 2022-03-03: 6000 [IU] via INTRAVENOUS

## 2022-03-03 MED ORDER — HEPARIN (PORCINE) IN NACL 1000-0.9 UT/500ML-% IV SOLN
INTRAVENOUS | Status: DC | PRN
  Administered 2022-03-03 (×2): 500 mL

## 2022-03-03 MED ORDER — ONDANSETRON HCL 4 MG/2ML IJ SOLN
INTRAMUSCULAR | Status: DC | PRN
  Administered 2022-03-03: 4 mg via INTRAVENOUS

## 2022-03-03 MED ORDER — SODIUM CHLORIDE 0.9% FLUSH: 3.0000 mL | Freq: Two times a day (BID) | INTRAVENOUS | Status: DC

## 2022-03-03 MED ORDER — SODIUM CHLORIDE 0.9 % IV SOLN: INTRAVENOUS | Status: DC

## 2022-03-03 MED ORDER — LIDOCAINE HCL (PF) 1 % IJ SOLN
INTRAMUSCULAR | Status: AC
  Filled 2022-03-03: qty 30

## 2022-03-03 MED ORDER — IOHEXOL 350 MG/ML SOLN
INTRAVENOUS | Status: DC | PRN
  Administered 2022-03-03: 50 mL

## 2022-03-03 MED ORDER — SODIUM CHLORIDE 0.9% FLUSH: 3.0000 mL | INTRAVENOUS | Status: DC | PRN

## 2022-03-03 MED ORDER — ASPIRIN 81 MG PO CHEW: 81.0000 mg | CHEWABLE_TABLET | ORAL | Status: AC

## 2022-03-03 MED ORDER — FENTANYL CITRATE (PF) 100 MCG/2ML IJ SOLN
INTRAMUSCULAR | Status: DC | PRN
  Administered 2022-03-03: 25 ug via INTRAVENOUS

## 2022-03-03 MED ORDER — HYDRALAZINE HCL 20 MG/ML IJ SOLN: 10.0000 mg | INTRAMUSCULAR | Status: AC | PRN

## 2022-03-03 MED ORDER — ENOXAPARIN SODIUM 40 MG/0.4ML IJ SOSY
40.0000 mg | PREFILLED_SYRINGE | INTRAMUSCULAR | Status: DC
  Administered 2022-03-04 – 2022-03-05 (×2): 40 mg via SUBCUTANEOUS
  Filled 2022-03-03 (×2): qty 0.4

## 2022-03-03 MED ORDER — VERAPAMIL HCL 2.5 MG/ML IV SOLN
INTRAVENOUS | Status: DC | PRN
  Administered 2022-03-03: 10 mL via INTRA_ARTERIAL

## 2022-03-03 MED ORDER — POTASSIUM CHLORIDE CRYS ER 20 MEQ PO TBCR
40.0000 meq | EXTENDED_RELEASE_TABLET | Freq: Once | ORAL | Status: AC
  Administered 2022-03-03: 40 meq via ORAL
  Filled 2022-03-03: qty 2

## 2022-03-03 MED ORDER — MIDAZOLAM HCL 2 MG/2ML IJ SOLN
INTRAMUSCULAR | Status: DC | PRN
  Administered 2022-03-03: 1 mg via INTRAVENOUS

## 2022-03-03 MED ORDER — VERAPAMIL HCL 2.5 MG/ML IV SOLN
INTRAVENOUS | Status: AC
  Filled 2022-03-03: qty 2

## 2022-03-03 MED ORDER — FENTANYL CITRATE (PF) 100 MCG/2ML IJ SOLN
INTRAMUSCULAR | Status: AC
  Filled 2022-03-03: qty 2

## 2022-03-03 MED ORDER — ASPIRIN 81 MG PO CHEW: 81.0000 mg | CHEWABLE_TABLET | ORAL | Status: DC

## 2022-03-03 MED ORDER — ACETAMINOPHEN 325 MG PO TABS: 650.0000 mg | ORAL_TABLET | ORAL | Status: DC | PRN

## 2022-03-03 MED ORDER — MIDAZOLAM HCL 2 MG/2ML IJ SOLN
INTRAMUSCULAR | Status: AC
  Filled 2022-03-03: qty 2

## 2022-03-03 SURGICAL SUPPLY — 12 items
CATH 5FR JL3.5 JR4 ANG PIG MP (CATHETERS) ×1 IMPLANT
CATH BALLN WEDGE 5F 110CM (CATHETERS) ×1 IMPLANT
DEVICE RAD COMP TR BAND LRG (VASCULAR PRODUCTS) ×1 IMPLANT
GLIDESHEATH SLEND SS 6F .021 (SHEATH) ×1 IMPLANT
GUIDEWIRE .025 260CM (WIRE) ×1 IMPLANT
GUIDEWIRE INQWIRE 1.5J.035X260 (WIRE) IMPLANT
INQWIRE 1.5J .035X260CM (WIRE) ×2
KIT MICROPUNCTURE NIT STIFF (SHEATH) ×1 IMPLANT
PACK CARDIAC CATHETERIZATION (CUSTOM PROCEDURE TRAY) ×2 IMPLANT
SHEATH GLIDE SLENDER 4/5FR (SHEATH) ×1 IMPLANT
SHEATH PROBE COVER 6X72 (BAG) ×1 IMPLANT
TRANSDUCER W/STOPCOCK (MISCELLANEOUS) ×2 IMPLANT

## 2022-03-03 NOTE — Interval H&P Note (Signed)
History and Physical Interval Note: ? ?03/03/2022 ?5:21 PM ? ?Douglas [name]  has presented today for surgery, with the diagnosis of heart failure.  The various methods of treatment have been discussed with the patient and family. After consideration of risks, benefits and other options for treatment, the patient has consented to  Procedure(s): ?RIGHT/LEFT HEART CATH AND CORONARY ANGIOGRAPHY (N/A) and possible coronary angioplasty as a surgical intervention.  The patient's history has been reviewed, patient examined, no change in status, stable for surgery.  I have reviewed the patient's chart and labs.  Questions were answered to the patient's satisfaction.   ? ? ?[name] ? ? ?

## 2022-03-03 NOTE — H&P (View-Only) (Signed)
? ? Advanced Heart Failure Rounding Note ? ?PCP-Cardiologist: None  ? ?Subjective:   ? ?Continues to diurese well with IV lasix. Down another 4 lb.  ? ?Still very short of breath. Winded with even light activity, + orthopnea. ? ? ?Objective:   ?Weight Range: ?131.5 kg ?Body mass index is 35.29 kg/m?.  ? ?Vital Signs:   ?Temp:  [97.8 ?F (36.6 ?C)-98.4 ?F (36.9 ?C)] 97.8 ?F (36.6 ?C) (04/19 1140) ?Pulse Rate:  [64-78] 64 (04/19 1140) ?Resp:  [18-20] 20 (04/19 1140) ?BP: (94-119)/(59-85) 108/68 (04/19 1140) ?SpO2:  [91 %-95 %] 94 % (04/19 1140) ?Weight:  [131.5 kg] 131.5 kg (04/19 0515) ?Last BM Date : 03/03/22 ? ?Weight change: ?Filed Weights  ? 03/01/22 1329 03/02/22 0500 03/03/22 0515  ?Weight: (!) 136.2 kg 133.3 kg 131.5 kg  ? ? ?Intake/Output:  ? ?Intake/Output Summary (Last 24 hours) at 03/03/2022 1211 ?Last data filed at 03/03/2022 1000 ?Gross per 24 hour  ?Intake 1145.3 ml  ?Output 2710 ml  ?Net -1564.7 ml  ?  ? ? ?Physical Exam  ?  ?General:  Sitting up on side of bed. ?HEENT: normal ?Neck: supple. JVP 10 cm. Carotids 2+ bilat; no bruits.  ?Cor: PMI nondisplaced. Regular rate & rhythm. No rubs, gallops or murmurs. ?Lungs: decreased in bases ?Abdomen: soft, nontender, nondistended. No hepatosplenomegaly.  ?Extremities: no cyanosis, clubbing, rash, edema ?Neuro: alert & orientedx3, cranial nerves grossly intact. moves all 4 extremities w/o difficulty. Affect pleasant ? ? ? ?Telemetry  ? ?SR 60s-70s, 5 PVCs/min, several runs NSVT overnight longest 17 beats ? ?EKG  ? N/A ?Labs  ?  ?CBC ?Recent Labs  ?  02/28/22 ?1431  ?WBC 7.6  ?HGB 15.6  ?HCT 43.9  ?MCV 85.1  ?PLT 224  ? ?Basic Metabolic Panel ?Recent Labs  ?  03/02/22 ?[id number] 03/02/22 ?[id number] 03/03/22 ?0420  ?NA 139  --  140  ?K 2.9* 3.1* 3.5  ?CL 104  --  106  ?CO2 27  --  26  ?GLUCOSE 105*  --  135*  ?BUN 20  --  20  ?CREATININE 1.38*  --  1.32*  ?CALCIUM 8.2*  --  8.6*  ?MG 1.9  --  2.2  ? ?Liver Function Tests ?No results for input(s): AST, ALT, ALKPHOS,  BILITOT, PROT, ALBUMIN in the last 72 hours. ?No results for input(s): LIPASE, AMYLASE in the last 72 hours. ?Cardiac Enzymes ?No results for input(s): CKTOTAL, CKMB, CKMBINDEX, TROPONINI in the last 72 hours. ? ?BNP: ?BNP (last 3 results) ?Recent Labs  ?  04/24/21 ?1534 02/28/22 ?1436  ?BNP 92.4 1,287.2*  ? ? ?ProBNP (last 3 results) ?No results for input(s): PROBNP in the last 8760 hours. ? ? ?D-Dimer ?No results for input(s): DDIMER in the last 72 hours. ?Hemoglobin A1C ?Recent Labs  ?  02/28/22 ?1431  ?HGBA1C 7.4*  ? ?Fasting Lipid Panel ?Recent Labs  ?  03/01/22 ?0717  ?CHOL 191  ?HDL 41  ?LDLCALC 133*  ?TRIG 86  ?CHOLHDL 4.7  ? ?Thyroid Function Tests ?No results for input(s): TSH, T4TOTAL, T3FREE, THYROIDAB in the last 72 hours. ? ?Invalid input(s): FREET3 ? ?Other results: ? ? ?Imaging  ? ? ?No results found. ? ? ?Medications:   ? ? ?Scheduled Medications: ? aspirin EC  81 mg Oral Daily  ? atorvastatin  40 mg Oral Daily  ? carvedilol  12.5 mg Oral BID WC  ? dapagliflozin propanediol  10 mg Oral Daily  ? heparin  5,000 Units Subcutaneous Q8H  ? insulin  aspart  0-15 Units Subcutaneous TID WC  ? insulin glargine-yfgn  18 Units Subcutaneous QHS  ? mouth rinse  15 mL Mouth Rinse BID  ? potassium chloride  40 mEq Oral BID  ? potassium chloride  40 mEq Oral Once  ? sacubitril-valsartan  1 tablet Oral BID  ? spironolactone  25 mg Oral Daily  ? ? ?Infusions: ? furosemide 120 mg (03/03/22 0851)  ? ? ?PRN Medications: ?acetaminophen, guaiFENesin-dextromethorphan, nitroGLYCERIN ? ? ? ?Patient Profile  ? ?Mr Holderby is a 46 year old with h/o substance abuse, tobacco abuse, obesity, VT, medtronic ICD, NICM, chronic HFrEF.He has been followed > 10 years in the HF clinic. ?  ?Admitted A/C HFrEF in the setting inability to obtain medications ? ?Assessment/Plan  ? ?1. HFrEF ?- NICM by cath in 2006. Medtronic ICD.  ?- Echo [id number] EF 30-35%.  ?- Echo 04/23: EF 25-30%, LV severely dilated, RV moderately down, moderate to severe  MR ?- Admitted with volume overload. BNP > 1200.  ?- Diuresing well with IV lasix 120 mg BID. Down 11 lb. No improvement in dyspnea and orthopnea. ?- Will arrange for [facility] this afternoon to assess R and L filling pressures, CO and r/o obstructive CAD. No recent ischemic workup. ?- Scr stable. Continue to monitor. ?- Continue coreg 12.5 mg BID ?- Continue entresto 49/51 mg BID ?- Continue spiro 25 mg daily ?- Continue farxiga 10 mg daily ?- Cardiac rehab.   ?  ?2. NSVT ?- Supp K (3.5), mag okay ?- Continue coreg at current dose.  ?- ? May need addition of amiodarone ?  ?3. DMII ?- SSI  ?- Hgb A1C 7.4  ?- Continue SGLT2I ?  ?4. Hyperlipidemia ?- LDL 131 ?- Continue atorvastatin. ?  ?5. Substance Abuse ?- UDS + Cocaine ?- Discussed cessation  ?  ?6.Tobacco Abuse ?- Discussed need for cessation ?  ?7. SDOH ?-Homeless. Consulted TOC CSW. Referring to [facility] at discharge. ?-He has been referred to HF Paramedicine.  ?-Has medicaid ? ? ?Length of Stay: 3 ? ?FINCH, LINDSAY N, PA-C  ?03/03/2022, 12:11 PM ? ?Advanced Heart Failure Team ?Pager [phone] (M-F; 7a - 5p)  ?Please contact [facility] for night-coverage after hours (5p -7a ) and weekends on [email or web address] ? ?Patient seen and examined with the above-signed Advanced Practice Provider and/or Housestaff. I personally reviewed laboratory data, imaging studies and relevant notes. I independently examined the patient and formulated the important aspects of the plan. I have edited the note to reflect any of my changes or salient points. I have personally discussed the plan with the patient and/or family. ? ?Has diuresed well but continues to complain of SOB and some orthopnea.. ? ?General:  Sitting up in bed No resp difficulty ?HEENT: normal ?Neck: supple. no JVD. Carotids 2+ bilat; no bruits. No lymphadenopathy or thryomegaly appreciated. ?Cor: PMI nondisplaced. Regular rate & rhythm. No rubs, gallops or murmurs. ?Lungs: clear ?Abdomen: soft, nontender, nondistended. No  hepatosplenomegaly. No bruits or masses. Good bowel sounds. ?Extremities: no cyanosis, clubbing, rash, edema ?Neuro: alert & orientedx3, cranial nerves grossly intact. moves all 4 extremities w/o difficulty. Affect pleasant ? ?Symptomatically not much improved despite diuresis. Will plan for R/L cath today to further evaluate.  ? ?Glori Bickers, MD  ?2:30 PM ? ? ? ?

## 2022-03-03 NOTE — Plan of Care (Signed)
  Problem: Education: Goal: Ability to demonstrate management of disease process will improve Outcome: Progressing Goal: Ability to verbalize understanding of medication therapies will improve Outcome: Progressing   

## 2022-03-03 NOTE — Progress Notes (Addendum)
? ? Advanced Heart Failure Rounding Note ? ?PCP-Cardiologist: None  ? ?Subjective:   ? ?Continues to diurese well with IV lasix. Down another 4 lb.  ? ?Still very short of breath. Winded with even light activity, + orthopnea. ? ? ?Objective:   ?Weight Range: ?131.5 kg ?Body mass index is 35.29 kg/m?.  ? ?Vital Signs:   ?Temp:  [97.8 ?F (36.6 ?C)-98.4 ?F (36.9 ?C)] 97.8 ?F (36.6 ?C) (04/19 1140) ?Pulse Rate:  [64-78] 64 (04/19 1140) ?Resp:  [18-20] 20 (04/19 1140) ?BP: (94-119)/(59-85) 108/68 (04/19 1140) ?SpO2:  [91 %-95 %] 94 % (04/19 1140) ?Weight:  [131.5 kg] 131.5 kg (04/19 0515) ?Last BM Date : 03/03/22 ? ?Weight change: ?Filed Weights  ? 03/01/22 1329 03/02/22 0500 03/03/22 0515  ?Weight: (!) 136.2 kg 133.3 kg 131.5 kg  ? ? ?Intake/Output:  ? ?Intake/Output Summary (Last 24 hours) at 03/03/2022 1211 ?Last data filed at 03/03/2022 1000 ?Gross per 24 hour  ?Intake 1145.3 ml  ?Output 2710 ml  ?Net -1564.7 ml  ?  ? ? ?Physical Exam  ?  ?General:  Sitting up on side of bed. ?HEENT: normal ?Neck: supple. JVP 10 cm. Carotids 2+ bilat; no bruits.  ?Cor: PMI nondisplaced. Regular rate & rhythm. No rubs, gallops or murmurs. ?Lungs: decreased in bases ?Abdomen: soft, nontender, nondistended. No hepatosplenomegaly.  ?Extremities: no cyanosis, clubbing, rash, edema ?Neuro: alert & orientedx3, cranial nerves grossly intact. moves all 4 extremities w/o difficulty. Affect pleasant ? ? ? ?Telemetry  ? ?SR 60s-70s, 5 PVCs/min, several runs NSVT overnight longest 17 beats ? ?EKG  ? [name]/A ?Labs  ?  ?CBC ?Recent Labs  ?  02/28/22 ?1431  ?WBC 7.6  ?HGB 15.6  ?HCT 43.9  ?MCV 85.1  ?PLT 224  ? ?Basic Metabolic Panel ?Recent Labs  ?  03/02/22 ?[id number] 03/02/22 ?[id number] 03/03/22 ?0420  ?NA 139  --  140  ?K 2.9* 3.1* 3.5  ?CL 104  --  106  ?CO2 27  --  26  ?GLUCOSE 105*  --  135*  ?BUN 20  --  20  ?CREATININE 1.38*  --  1.32*  ?CALCIUM 8.2*  --  8.6*  ?MG 1.9  --  2.2  ? ?Liver Function Tests ?No results for input(s): AST, ALT, ALKPHOS,  BILITOT, PROT, ALBUMIN in the last 72 hours. ?No results for input(s): LIPASE, AMYLASE in the last 72 hours. ?Cardiac Enzymes ?No results for input(s): CKTOTAL, CKMB, CKMBINDEX, TROPONINI in the last 72 hours. ? ?BNP: ?BNP (last 3 results) ?Recent Labs  ?  04/24/21 ?1534 02/28/22 ?1436  ?BNP 92.4 1,287.2*  ? ? ?ProBNP (last 3 results) ?No results for input(s): PROBNP in the last 8760 hours. ? ? ?D-Dimer ?No results for input(s): DDIMER in the last 72 hours. ?Hemoglobin A1C ?Recent Labs  ?  02/28/22 ?1431  ?HGBA1C 7.4*  ? ?Fasting Lipid Panel ?Recent Labs  ?  03/01/22 ?0717  ?CHOL 191  ?HDL 41  ?LDLCALC 133*  ?TRIG 86  ?CHOLHDL 4.7  ? ?Thyroid Function Tests ?No results for input(s): TSH, T4TOTAL, T3FREE, THYROIDAB in the last 72 hours. ? ?Invalid input(s): FREET3 ? ?Other results: ? ? ?Imaging  ? ? ?No results found. ? ? ?Medications:   ? ? ?Scheduled Medications: ? aspirin EC  81 mg Oral Daily  ? atorvastatin  40 mg Oral Daily  ? carvedilol  12.5 mg Oral BID WC  ? dapagliflozin propanediol  10 mg Oral Daily  ? heparin  5,000 Units Subcutaneous Q8H  ? insulin  aspart  0-15 Units Subcutaneous TID WC  ? insulin glargine-yfgn  18 Units Subcutaneous QHS  ? mouth rinse  15 mL Mouth Rinse BID  ? potassium chloride  40 mEq Oral BID  ? potassium chloride  40 mEq Oral Once  ? sacubitril-valsartan  1 tablet Oral BID  ? spironolactone  25 mg Oral Daily  ? ? ?Infusions: ? furosemide 120 mg (03/03/22 0851)  ? ? ?PRN Medications: ?acetaminophen, guaiFENesin-dextromethorphan, nitroGLYCERIN ? ? ? ?Patient Profile  ? ?Douglas [name] is a 46 year old with h/o substance abuse, tobacco abuse, obesity, VT, medtronic ICD, NICM, chronic HFrEF.He has been followed > 10 years in the HF clinic. ?  ?Admitted A/C HFrEF in the setting inability to obtain medications ? ?Assessment/Plan  ? ?1. HFrEF ?- NICM by cath in 2006. Medtronic ICD.  ?- Echo [id number] EF 30-35%.  ?- Echo 04/23: EF 25-30%, LV severely dilated, RV moderately down, moderate to severe  Douglas ?- Admitted with volume overload. BNP > 1200.  ?- Diuresing well with IV lasix 120 mg BID. Down 11 lb. No improvement in dyspnea and orthopnea. ?- Will arrange for [facility] this afternoon to assess R and L filling pressures, CO and r/o obstructive CAD. No recent ischemic workup. ?- Scr stable. Continue to monitor. ?- Continue coreg 12.5 mg BID ?- Continue entresto 49/51 mg BID ?- Continue spiro 25 mg daily ?- Continue farxiga 10 mg daily ?- Cardiac rehab.   ?  ?2. NSVT ?- Supp K (3.5), mag okay ?- Continue coreg at current dose.  ?- ? May need addition of amiodarone ?  ?3. DMII ?- SSI  ?- Hgb A1C 7.4  ?- Continue SGLT2I ?  ?4. Hyperlipidemia ?- LDL 131 ?- Continue atorvastatin. ?  ?5. Substance Abuse ?- UDS + Cocaine ?- Discussed cessation  ?  ?6.Tobacco Abuse ?- Discussed need for cessation ?  ?7. SDOH ?-Homeless. Consulted TOC CSW. Referring to [facility] at discharge. ?-He has been referred to HF Paramedicine.  ?-Has medicaid ? ? ?Length of Stay: 3 ? ?Douglas [name], Douglas N, PA-C  ?03/03/2022, 12:11 PM ? ?Advanced Heart Failure Team ?Pager [phone] (M-F; 7a - 5p)  ?Please contact [facility] for night-coverage after hours (5p -7a ) and weekends on [email or web address] ? ?Patient seen and examined with the above-signed Advanced Practice Provider and/or Housestaff. I personally reviewed laboratory data, imaging studies and relevant notes. I independently examined the patient and formulated the important aspects of the plan. I have edited the note to reflect any of my changes or salient points. I have personally discussed the plan with the patient and/or family. ? ?Has diuresed well but continues to complain of SOB and some orthopnea.. ? ?General:  Sitting up in bed No resp difficulty ?HEENT: normal ?Neck: supple. no JVD. Carotids 2+ bilat; no bruits. No lymphadenopathy or thryomegaly appreciated. ?Cor: PMI nondisplaced. Regular rate & rhythm. No rubs, gallops or murmurs. ?Lungs: clear ?Abdomen: soft, nontender, nondistended. No  hepatosplenomegaly. No bruits or masses. Good bowel sounds. ?Extremities: no cyanosis, clubbing, rash, edema ?Neuro: alert & orientedx3, cranial nerves grossly intact. moves all 4 extremities w/o difficulty. Affect pleasant ? ?Symptomatically not much improved despite diuresis. Will plan for R/L cath today to further evaluate.  ? ?Glori Bickers, MD  ?2:30 PM ? ? ? ?

## 2022-03-03 NOTE — Progress Notes (Signed)
CARDIAC REHAB PHASE I  ? ?Offered to walk with pt. Pt states fatigue now, but has plans to walk later. Pt given HF booklet. Stressed importance of symptom monitoring and medication compliance. Pt able to breathe easier when lying down. Pt denies further questions or concerns.  ? ?[id number] ?Reynold Bowen, RN BSN ?03/03/2022 ?2:04 PM ? ?

## 2022-03-03 NOTE — TOC Progression Note (Addendum)
Transition of Care (TOC) - Progression Note  ? ? ?Patient Details  ?Name: Douglas [name] ?MRN: [id number] ?Date of Birth: 06-Aug-1976 ? ?Transition of Care (TOC) CM/SW Contact  ?Zakaria Sedor, LCSW ?Phone Number: ?03/03/2022, 2:12 PM ? ?Clinical Narrative:    ?HF CSW spoke with Mr. Caughell at bedside again about the plan at time of discharge and Mr. Flight said he would go to the [facility] once he is discharged. Mr. Whichard reported that he does get Food Stamps already and that food isn't a concern for him right now but shelter is. CSW doesn't have any shelter options at this time other than the resources already provided to Mr. Peek. ? ?CSW will continue to follow throughout discharge. ? ? ?Expected Discharge Plan: Home/Self Care ?Barriers to Discharge: Continued Medical Work up ? ?Expected Discharge Plan and Services ?Expected Discharge Plan: Home/Self Care ?In-house Referral: Clinical Social Work ?Discharge Planning Services: CM Consult ?  ?Living arrangements for the past 2 months: Homeless ?                ?  ?  ?  ?  ?  ?  ?  ?  ?  ?  ? ? ?Social Determinants of Health (SDOH) Interventions ?Financial Strain Interventions: Artist ?Housing Interventions: Other (Comment) (provided shelter resources) ?Transportation Interventions: Intervention Not Indicated ? ?Readmission Risk Interventions ?   ? View : No data to display.  ?  ?  ?  ? ?Brecklynn Jian, MSW, LCSW ?[phone] ?Heart Failure Social Worker  ?

## 2022-03-04 ENCOUNTER — Encounter (HOSPITAL_COMMUNITY): Payer: Self-pay | Admitting: Internal Medicine

## 2022-03-04 LAB — BASIC METABOLIC PANEL
Anion gap: 7 (ref 5–15)
BUN: 23 mg/dL — ABNORMAL HIGH (ref 6–20)
CO2: 24 mmol/L (ref 22–32)
Calcium: 8.6 mg/dL — ABNORMAL LOW (ref 8.9–10.3)
Chloride: 106 mmol/L (ref 98–111)
Creatinine, Ser: 1.62 mg/dL — ABNORMAL HIGH (ref 0.61–1.24)
GFR, Estimated: 52 mL/min — ABNORMAL LOW (ref >60)
Glucose, Bld: 109 mg/dL — ABNORMAL HIGH (ref 70–99)
Potassium: 4.5 mmol/L (ref 3.5–5.1)
Sodium: 137 mmol/L (ref 135–145)

## 2022-03-04 LAB — POCT I-STAT EG7
Acid-Base Excess: 5 mmol/L — ABNORMAL HIGH (ref 0.0–2.0)
Bicarbonate: 30 mmol/L — ABNORMAL HIGH (ref 20.0–28.0)
Calcium, Ion: 1.19 mmol/L (ref 1.15–1.40)
HCT: 43 % (ref 39.0–52.0)
Hemoglobin: 14.6 g/dL (ref 13.0–17.0)
O2 Saturation: 60 %
Potassium: 4.3 mmol/L (ref 3.5–5.1)
Sodium: 142 mmol/L (ref 135–145)
TCO2: 31 mmol/L (ref 22–32)
pCO2, Ven: 45.1 mmHg (ref 44–60)
pH, Ven: 7.43 (ref 7.25–7.43)
pO2, Ven: 31 mmHg — CL (ref 32–45)

## 2022-03-04 LAB — GLUCOSE, CAPILLARY
Glucose-Capillary: 128 mg/dL — ABNORMAL HIGH (ref 70–99)
Glucose-Capillary: 174 mg/dL — ABNORMAL HIGH (ref 70–99)
Glucose-Capillary: 146 mg/dL — ABNORMAL HIGH (ref 70–99)
Glucose-Capillary: 149 mg/dL — ABNORMAL HIGH (ref 70–99)

## 2022-03-04 LAB — MAGNESIUM: Magnesium: 2.3 mg/dL (ref 1.7–2.4)

## 2022-03-04 MED ORDER — TORSEMIDE 100 MG PO TABS
100.0000 mg | ORAL_TABLET | Freq: Every day | ORAL | Status: DC
  Administered 2022-03-05: 100 mg via ORAL
  Filled 2022-03-04: qty 1

## 2022-03-04 MED ORDER — METOLAZONE 5 MG PO TABS
5.0000 mg | ORAL_TABLET | Freq: Every day | ORAL | Status: DC
  Administered 2022-03-04 – 2022-03-05 (×2): 5 mg via ORAL
  Filled 2022-03-04 (×2): qty 1

## 2022-03-04 NOTE — Discharge Summary (Addendum)
?Advanced Heart Failure Team ? ?Discharge Summary  ? ?Patient ID: Douglas [name] ?MRN: [id number], DOB/AGE: 1976/02/16 46 y.o. Admit date: 02/28/2022 ?D/C date:     03/05/2022  ? ?Primary Discharge Diagnoses:  ?A/C HFrEF ?NSVT ?DMII ?Hyperlipidemia  ?Substance Abuse ?Tobacco Abuse ?  ?Hospital Course:  ?[name] is a 46 year old with h/o substance abuse, tobacco abuse, obesity, VT, medtronic ICD, NICM, chronic HFrEF.He has been followed > 10 years in the HF clinic.  ? ?Admitted with A/C HFrEF. Diuresed with IV lasix. Once diuresed transitioned to torsemide 100 mg daily. Overall diuresed 21 lb. Discharge wt 279 lb.  ? ?Had RHC/LHC with elevated filling pressures, moderately reduced CO, mild nonob CAD, and severe NICM. Plan to continue GDMT. All meds to sent to [facility] at D/C  ? ?See below for detailed problems list. He will contine to be followed closely in the HF clinic.  ? ?1. Acute on Chronic HFrEF ?- NICM by cath in 2006. Medtronic ICD.  ?- Echo 02725 EF 30-35%.  ?- Echo 04/23: EF 25-30%, LV severely dilated, RV moderately down, moderate to severe MR ?- Admitted with volume overload. BNP > 1200.  ?- S/P RHC/LHC with elevated filling pressures, moderately reduced CO, mild nonob CAD, and severe NICM. RA 14/PCWP 26. Diuresed well w/ IV Lasix. Diuresed 21 lb.  ?- Switch back to Torsemide, 100 mg daily   ?- Continue lower dose of coreg at 6.25 mg twice a day.  ?- Continue entresto 49/51 mg BID ?- Continue spiro 25 mg daily ?- Continue farxiga 10 mg daily ?  ?  ?2. NSVT ?- improved, no recurrence on tele  ?- Continue coreg at current dose.  ?- K and Mg stable  ?  ?  ?3. DMII ?- SSI  ?- Hgb A1C 7.4  ?- Continue SGLT2I ?  ?4. Hyperlipidemia ?- LDL 131 ?- Continue atorvastatin. ?  ?5. Substance Abuse ?- UDS + Cocaine ?- Discussed cessation  ?  ?6.Tobacco Abuse ?- Discussed need for cessation ?  ?7. CAD ?-Mild non ob CAD cath  ?-Continue asa + statin ?  ?8. SDOH ?-Homeless. Consulted TOC CSW. Referring to [facility] at  discharge. ?-He has been referred to HF Paramedicine.  ?-Has medicaid ?  ?Stable for d/c home today. Hospital f/u has been arranged. ? ?Discharge Weight Range: 279 lb ?Discharge Vitals: Blood pressure 109/74, pulse 76, temperature 97.7 ?F (36.5 ?C), temperature source Oral, resp. rate 16, height $RemoveBe'6\' 4"'gehGpdVfy$  (1.93 m), weight 126.9 kg, SpO2 95 %. ? ?Labs: ?Lab Results  ?Component Value Date  ? WBC 7.1 03/03/2022  ? HGB 14.4 03/03/2022  ? HCT 43.8 03/03/2022  ? MCV 86.2 03/03/2022  ? PLT 235 03/03/2022  ?  ?Recent Labs  ?Lab 03/05/22 ?3664  ?NA 137  ?K 3.9  ?CL 99  ?CO2 28  ?BUN 30*  ?CREATININE 1.52*  ?CALCIUM 9.2  ?GLUCOSE 111*  ? ?Lab Results  ?Component Value Date  ? CHOL 191 03/01/2022  ? HDL 41 03/01/2022  ? LDLCALC 133 (H) 03/01/2022  ? TRIG 86 03/01/2022  ? ?BNP (last 3 results) ?Recent Labs  ?  04/24/21 ?1534 02/28/22 ?1436  ?BNP 92.4 1,287.2*  ? ? ?ProBNP (last 3 results) ?No results for input(s): PROBNP in the last 8760 hours. ? ? ?Diagnostic Studies/Procedures  ? ?CARDIAC CATHETERIZATION ? ?Result Date: 03/03/2022 ?  Prox RCA lesion is 30% stenosed.   Dist RCA lesion is 50% stenosed.   Ost Cx to Prox Cx lesion is 20% stenosed.  Prox LAD to Mid LAD lesion is 40% stenosed.   Dist LAD lesion is 20% stenosed.   The left ventricular ejection fraction is 25-35% by visual estimate. Findings: Ao = 97/76 (85) LV = 98/30 RA =  14 RV = 66/17 PA = 66/26 (47) PCW = 26 Fick cardiac output/index = 5.6/2.1 PVR = 4.0 WU SVR = 983 Ao sat = 92% PA sat = 60%, 65% Assessment: 1. Severe NICM EF 25% 2. Mild non-obstructive CAD 3. Elevated filling pressures with moderately reduced CO 4. Moderate mixed pulmonary HTN Plan/Discussion: Continue diuresis. Cut back b-blocker. Glori Bickers, MD 6:02 PM  ? ?Discharge Medications  ? ?Allergies as of 03/05/2022   ? ?   Reactions  ? Bydureon [exenatide] Rash  ? Penicillins Other (See Comments)  ? Unknown childhood reaction  ? ?  ? ?  ?Medication List  ?  ? ?STOP taking these medications    ? ?BiDil 20-37.5 MG tablet ?Generic drug: isosorbide-hydrALAZINE ?  ? ?  ? ?TAKE these medications   ? ?Accu-Chek Guide Control Liqd ?1 each by In Vitro route once as needed for up to 1 dose. ?  ?Accu-Chek Guide Me w/Device Kit ?Check blood sugar twice daily E11.8 ?  ?Accu-Chek Guide test strip ?Generic drug: glucose blood ?Check blood sugar twice daily E11.8 ?  ?Accu-Chek Softclix Lancets lancets ?Check blood sugar twice daily E11.8 ?  ?acetaminophen 500 MG tablet ?Commonly known as: TYLENOL ?Take 1 tablet (500 mg total) by mouth every 6 (six) hours as needed. ?What changed: reasons to take this ?  ?Aspirin Low Dose 81 MG EC tablet ?Generic drug: aspirin ?Take 1 tablet (81 mg total) by mouth daily. ?  ?atorvastatin 40 MG tablet ?Commonly known as: LIPITOR ?Take 1 tablet (40 mg total) by mouth daily. ?  ?Basaglar KwikPen 100 UNIT/ML ?Inject 36 Units into the skin daily. must have office visit for refills. ?What changed: when to take this ?  ?BD Pen Needle Nano U/F 32G X 4 MM Misc ?Generic drug: Insulin Pen Needle ?INJECT 1 APPLICATION INTO THE SKIN DAILY. THE PATIENT IS INSULIN REQUIRING, ICD 10 CODE E11.9. THE PATIENT INJECTS 1 TIMES PER DAY. ?  ?Blood Pressure Monitor Devi ?Please provide patient with insurance approved blood pressure monitor I10.0 ?  ?carvedilol 6.25 MG tablet ?Commonly known as: COREG ?Take 1 tablet (6.25 mg total) by mouth 2 (two) times daily with a meal. ?What changed:  ?medication strength ?how much to take ?  ?clotrimazole-betamethasone cream ?Commonly known as: LOTRISONE ?Apply 1 application topically daily. ?  ?Entresto 49-51 MG ?Generic drug: sacubitril-valsartan ?TAKE 1 TABLET BY MOUTH 2 (TWO) TIMES DAILY. ?  ?Farxiga 10 MG Tabs tablet ?Generic drug: dapagliflozin propanediol ?Take 1 tablet (10 mg total) by mouth daily before breakfast. ?  ?fluticasone 50 MCG/ACT nasal spray ?Commonly known as: FLONASE ?Place 2 sprays into both nostrils daily. ?  ?potassium chloride SA 20 MEQ  tablet ?Commonly known as: KLOR-CON M ?TAKE 2 TABLETS (40 MEQ TOTAL) BY MOUTH 2 (TWO) TIMES DAILY. ?What changed:  ?how much to take ?when to take this ?  ?sildenafil 100 MG tablet ?Commonly known as: VIAGRA ?Take 1 tablet (100 mg total) by mouth daily as needed for erectile dysfunction. ?  ?spironolactone 25 MG tablet ?Commonly known as: ALDACTONE ?Take 1 tablet (25 mg total) by mouth daily. ?  ?torsemide 100 MG tablet ?Commonly known as: DEMADEX ?Take 1 tablet (100 mg total) by mouth daily. LAST REFILL NEEDS FOLLOW UP APPOINTMENT FOR ANYMORE  REFILLS ?What changed: additional instructions ?  ?Victoza 18 MG/3ML Sopn ?Generic drug: liraglutide ?Inject 1.8 mg into the skin daily. ?  ? ?  ? ? ?Disposition  ? ?The patient will be discharged in stable condition to home. ? ? Follow-up Information   ? ? [facility] Follow up on 03/17/2022.   ?Specialty: Cardiology ?Why: at 10:30 ?Contact information: ?[address] ?[phone] mc ?[place] ?[phone] ? ?  ?  ? ?  ?  ? ?  ?  ? ? ?Duration of Discharge Encounter: Greater than 35 minutes  ? ?Signed, ?Lyda Jester, PA-C  ?03/05/2022, 1:02 PM ? ?

## 2022-03-04 NOTE — Progress Notes (Addendum)
? ? Advanced Heart Failure Rounding Note ? ?PCP-Cardiologist: None  ? ?Subjective:   ?Admit weight 300>>289.6 pounds  ? ?4/19 RHC/LHC with elevated filling pressures, moderately reduced CO, mild nonob CAD, and severe NICM. Continued to diurese with IV lasix. Sluggish urine output.  Only received one dose of IV lasix.  ? ?Creatinine 1.4>1.6  ? ? ?Feeling better. Denies SOB.  ? ?Objective:   ?Weight Range: ?131.4 kg ?Body mass index is 35.25 kg/m?.  ? ?Vital Signs:   ?Temp:  [97.8 ?F (36.6 ?C)-98.5 ?F (36.9 ?C)] 98.5 ?F (36.9 ?C) (04/20 0459) ?Pulse Rate:  [59-82] 76 (04/20 0459) ?Resp:  [20] 20 (04/20 0459) ?BP: (108-123)/(60-78) 111/71 (04/20 0459) ?SpO2:  [76 %-94 %] 93 % (04/20 0459) ?Weight:  [131.4 kg] 131.4 kg (04/20 0459) ?Last BM Date : 03/03/22 ? ?Weight change: ?Filed Weights  ? 03/02/22 0500 03/03/22 0515 03/04/22 0459  ?Weight: 133.3 kg 131.5 kg 131.4 kg  ? ? ?Intake/Output:  ? ?Intake/Output Summary (Last 24 hours) at 03/04/2022 0833 ?Last data filed at 03/04/2022 0747 ?Gross per 24 hour  ?Intake 908.3 ml  ?Output 1000 ml  ?Net -91.7 ml  ?  ? ? ?Physical Exam  ?  ?General:  No resp difficulty ?HEENT: normal ?Neck: supple. JVP 11-1 2. Carotids 2+ bilat; no bruits. No lymphadenopathy or thryomegaly appreciated. ?Cor: PMI nondisplaced. Regular rate & rhythm. No rubs, gallops or murmurs. ?Lungs: clear ?Abdomen: soft, nontender, nondistended. No hepatosplenomegaly. No bruits or masses. Good bowel sounds. ?Extremities: no cyanosis, clubbing, rash, edema ?Neuro: alert & orientedx3, cranial nerves grossly intact. moves all 4 extremities w/o difficulty. Affect pleasant ? ?Telemetry  ?SR 60-80s with episodes of NSVT  ? ?EKG  ? N/A ?Labs  ?  ?CBC ?Recent Labs  ?  03/03/22 ?1738 03/03/22 ?1926  ?WBC  --  7.1  ?HGB 14.6  14.6 14.4  ?HCT 43.0  43.0 43.8  ?MCV  --  86.2  ?PLT  --  235  ? ?Basic Metabolic Panel ?Recent Labs  ?  03/02/22 ?0047 03/02/22 ?0950 03/03/22 ?0420 03/03/22 ?1734 03/03/22 ?1738 03/03/22 ?1926  03/04/22 ?0455  ?NA 139  --  140   < > 142  142  --  137  ?K 2.9*   < > 3.5   < > 4.3  4.1  --  4.5  ?CL 104  --  106  --   --   --  106  ?CO2 27  --  26  --   --   --  24  ?GLUCOSE 105*  --  135*  --   --   --  109*  ?BUN 20  --  20  --   --   --  23*  ?CREATININE 1.38*  --  1.32*  --   --  1.41* 1.62*  ?CALCIUM 8.2*  --  8.6*  --   --   --  8.6*  ?MG 1.9  --  2.2  --   --   --   --   ? < > = values in this interval not displayed.  ? ?Liver Function Tests ?No results for input(s): AST, ALT, ALKPHOS, BILITOT, PROT, ALBUMIN in the last 72 hours. ?No results for input(s): LIPASE, AMYLASE in the last 72 hours. ?Cardiac Enzymes ?No results for input(s): CKTOTAL, CKMB, CKMBINDEX, TROPONINI in the last 72 hours. ? ?BNP: ?BNP (last 3 results) ?Recent Labs  ?  04/24/21 ?1534 02/28/22 ?1436  ?BNP 92.4 1,287.2*  ? ? ?ProBNP (last 3  results) ?No results for input(s): PROBNP in the last 8760 hours. ? ? ?D-Dimer ?No results for input(s): DDIMER in the last 72 hours. ?Hemoglobin A1C ?No results for input(s): HGBA1C in the last 72 hours. ? ?Fasting Lipid Panel ?No results for input(s): CHOL, HDL, LDLCALC, TRIG, CHOLHDL, LDLDIRECT in the last 72 hours. ? ?Thyroid Function Tests ?No results for input(s): TSH, T4TOTAL, T3FREE, THYROIDAB in the last 72 hours. ? ?Invalid input(s): FREET3 ? ?Other results: ? ? ?Imaging  ? ? ?CARDIAC CATHETERIZATION ? ?Result Date: 03/03/2022 ?  Prox RCA lesion is 30% stenosed.   Dist RCA lesion is 50% stenosed.   Ost Cx to Prox Cx lesion is 20% stenosed.   Prox LAD to Mid LAD lesion is 40% stenosed.   Dist LAD lesion is 20% stenosed.   The left ventricular ejection fraction is 25-35% by visual estimate. Findings: Ao = 97/76 (85) LV = 98/30 RA =  14 RV = 66/17 PA = 66/26 (47) PCW = 26 Fick cardiac output/index = 5.6/2.1 PVR = 4.0 WU SVR = 983 Ao sat = 92% PA sat = 60%, 65% Assessment: 1. Severe NICM EF 25% 2. Mild non-obstructive CAD 3. Elevated filling pressures with moderately reduced CO 4. Moderate  mixed pulmonary HTN Plan/Discussion: Continue diuresis. Cut back b-blocker. Glori Bickers, MD 6:02 PM  ? ? ?Medications:   ? ? ?Scheduled Medications: ? aspirin EC  81 mg Oral Daily  ? atorvastatin  40 mg Oral Daily  ? carvedilol  6.25 mg Oral BID WC  ? dapagliflozin propanediol  10 mg Oral Daily  ? enoxaparin (LOVENOX) injection  40 mg Subcutaneous Q24H  ? insulin aspart  0-15 Units Subcutaneous TID WC  ? insulin glargine-yfgn  18 Units Subcutaneous QHS  ? mouth rinse  15 mL Mouth Rinse BID  ? potassium chloride  40 mEq Oral BID  ? sacubitril-valsartan  1 tablet Oral BID  ? sodium chloride flush  3 mL Intravenous Q12H  ? spironolactone  25 mg Oral Daily  ? ? ?Infusions: ? sodium chloride    ? furosemide 120 mg (03/03/22 0851)  ? ? ?PRN Medications: ?sodium chloride, acetaminophen, guaiFENesin-dextromethorphan, nitroGLYCERIN, sodium chloride flush ? ? ? ?Patient Profile  ? ?Douglas [name] is a 46 year old with h/o substance abuse, tobacco abuse, obesity, VT, medtronic ICD, NICM, chronic HFrEF.He has been followed > 10 years in the HF clinic. ?  ?Admitted A/C HFrEF in the setting inability to obtain medications ? ?Assessment/Plan  ? ?1. Acute on Chronic HFrEF ?- NICM by cath in 2006. Medtronic ICD.  ?- Echo [id number] EF 30-35%.  ?- Echo 04/23: EF 25-30%, LV severely dilated, RV moderately down, moderate to severe Douglas ?- Admitted with volume overload. BNP > 1200.  ?- S/P RHC/LHC with elevated filling pressures, moderately reduced CO, mild nonob CAD, and severe NICM. RA 14/PCWP 26. Continued to diurese with IV lasix and  give dose of metolazone.  ?- Continue lower dose of coreg at 6.25 mg twice a day.  ?- Continue entresto 49/51 mg BID ?- Continue spiro 25 mg daily ?- Continue farxiga 10 mg daily ?- Cardiac rehab.   ?- BME Tin am.  ?  ?2. NSVT ?- K 4.5 . Check Mag daily.  ?- Continue coreg at current dose.  ?- ? May need addition of amiodarone ?  ?3. DMII ?- SSI  ?- Hgb A1C 7.4  ?- Continue SGLT2I ?  ?4. Hyperlipidemia ?-  LDL 131 ?- Continue atorvastatin. ?  ?5. Substance  Abuse ?- UDS + Cocaine ?- Discussed cessation  ?  ?6.Tobacco Abuse ?- Discussed need for cessation ?  ?7. CAD ?-Mild non ob CAD cath  ?-Continue asa + statin ? ?8. SDOH ?-Homeless. Consulted TOC CSW. Referring to [facility] at discharge. ?-He has been referred to HF Paramedicine.  ?-Has medicaid ? ? ?Length of Stay: 4 ? ?Douglas Grinder, NP  ?03/04/2022, 8:33 AM ? ?Advanced Heart Failure Team ?Pager [phone] (M-F; 7a - 5p)  ?Please contact [facility] for night-coverage after hours (5p -7a ) and weekends on [email or web address] ? ?Patient seen and examined with the above-signed Advanced Practice Provider and/or Housestaff. I personally reviewed laboratory data, imaging studies and relevant notes. I independently examined the patient and formulated the important aspects of the plan. I have edited the note to reflect any of my changes or salient points. I have personally discussed the plan with the patient and/or family. ? ?Results of heart cath reviewed. Diuresing well today with massive urine output.  ? ?Feels much better. No orthopnea or PND.  ? ?General:  Well appearing. No resp difficulty ?HEENT: normal ?Neck: supple. no JVD. Carotids 2+ bilat; no bruits. No lymphadenopathy or thryomegaly appreciated. ?Cor: PMI nondisplaced. Regular rate & rhythm. No rubs, gallops or murmurs. ?Lungs: clear ?Abdomen: soft, nontender, nondistended. No hepatosplenomegaly. No bruits or masses. Good bowel sounds. ?Extremities: no cyanosis, clubbing, rash, edema ?Neuro: alert & orientedx3, cranial nerves grossly intact. moves all 4 extremities w/o difficulty. Affect pleasant ? ?Volume status much improved. Hopefully can go home tomorrow after full diuresis. Discussed need for cessation of tobacco and cocaine.  ? ?Glori Bickers, MD  ?10:53 PM ? ? ? ? ?

## 2022-03-05 ENCOUNTER — Other Ambulatory Visit (HOSPITAL_COMMUNITY): Payer: Self-pay

## 2022-03-05 LAB — BASIC METABOLIC PANEL
Anion gap: 10 (ref 5–15)
BUN: 30 mg/dL — ABNORMAL HIGH (ref 6–20)
CO2: 28 mmol/L (ref 22–32)
Calcium: 9.2 mg/dL (ref 8.9–10.3)
Chloride: 99 mmol/L (ref 98–111)
Creatinine, Ser: 1.52 mg/dL — ABNORMAL HIGH (ref 0.61–1.24)
GFR, Estimated: 57 mL/min — ABNORMAL LOW (ref >60)
Glucose, Bld: 111 mg/dL — ABNORMAL HIGH (ref 70–99)
Potassium: 3.9 mmol/L (ref 3.5–5.1)
Sodium: 137 mmol/L (ref 135–145)

## 2022-03-05 LAB — GLUCOSE, CAPILLARY
Glucose-Capillary: 131 mg/dL — ABNORMAL HIGH (ref 70–99)
Glucose-Capillary: 138 mg/dL — ABNORMAL HIGH (ref 70–99)

## 2022-03-05 LAB — MAGNESIUM: Magnesium: 2.4 mg/dL (ref 1.7–2.4)

## 2022-03-05 MED ORDER — CARVEDILOL 6.25 MG PO TABS
6.2500 mg | ORAL_TABLET | Freq: Two times a day (BID) | ORAL | 6 refills | Status: DC
  Filled 2022-03-05 – 2022-03-31 (×2): qty 60, 30d supply, fill #0
  Filled 2022-04-27: qty 60, 30d supply, fill #1

## 2022-03-05 MED ORDER — ATORVASTATIN CALCIUM 40 MG PO TABS
40.0000 mg | ORAL_TABLET | Freq: Every day | ORAL | 6 refills | Status: DC
  Filled 2022-03-05 – 2022-03-31 (×2): qty 30, 30d supply, fill #0
  Filled 2022-05-17 – 2022-06-14 (×3): qty 30, 30d supply, fill #1
  Filled 2022-07-22 – 2022-08-24 (×3): qty 30, 30d supply, fill #2
  Filled 2022-09-21: qty 30, 30d supply, fill #3

## 2022-03-05 MED ORDER — DAPAGLIFLOZIN PROPANEDIOL 10 MG PO TABS
10.0000 mg | ORAL_TABLET | Freq: Every day | ORAL | 3 refills | Status: DC
  Filled 2022-03-05 – 2022-03-31 (×2): qty 30, 30d supply, fill #0
  Filled 2022-04-27 – 2022-05-17 (×2): qty 30, 30d supply, fill #1

## 2022-03-05 MED ORDER — ASPIRIN 81 MG PO TBEC
81.0000 mg | DELAYED_RELEASE_TABLET | Freq: Every day | ORAL | 5 refills | Status: DC
Start: 2022-03-05 — End: 2022-11-19
  Filled 2022-03-05: qty 30, 30d supply, fill #0
  Filled 2022-10-11: qty 30, 30d supply, fill #1

## 2022-03-05 MED ORDER — TORSEMIDE 100 MG PO TABS
100.0000 mg | ORAL_TABLET | Freq: Every day | ORAL | 6 refills | Status: DC
  Filled 2022-03-05 – 2022-03-31 (×2): qty 30, 30d supply, fill #0
  Filled 2022-04-27: qty 30, 30d supply, fill #1

## 2022-03-05 MED ORDER — SPIRONOLACTONE 25 MG PO TABS
25.0000 mg | ORAL_TABLET | Freq: Every day | ORAL | 5 refills | Status: DC
  Filled 2022-03-05: qty 30, 30d supply, fill #0

## 2022-03-05 MED ORDER — ENTRESTO 49-51 MG PO TABS
1.0000 | ORAL_TABLET | Freq: Two times a day (BID) | ORAL | 11 refills | Status: DC
  Filled 2022-03-05 – 2022-03-31 (×2): qty 60, 30d supply, fill #0
  Filled 2022-04-27: qty 60, 30d supply, fill #1
  Filled 2022-07-22: qty 60, 30d supply, fill #2
  Filled 2022-08-24: qty 60, 30d supply, fill #3
  Filled 2022-09-21: qty 60, 30d supply, fill #4

## 2022-03-05 NOTE — Consult Note (Signed)
? ?  [facility] CM Inpatient Consult ? ? ?03/05/2022 ? ?Douglas [name] ?11-08-1976 ?[id number] ? ?Managed Medicaid [MM]: [employer] ? ?Primary Care Provider:  Claiborne Rigg, NP ,[facility], this provider does the [facility] follow up ? ?Discussed in progression meeting for post hospital follow up.  Reviewed note from [facility] MM RNCM regarding follow up with patient.  Gave patient a reminder appointment card with contact information for [facility] to assist with Managed Medicaid.  Patient agrees he will call he is going to his cousin's to pick up his car and then go to [facility]. ? ?For questions, ? ?Charlesetta Shanks, RN BSN CCM ?[employer] Liaison ? [phone] business mobile phone ?Toll free office [phone]  ?Fax number: [phone] ?[email or web address] ? ? ?

## 2022-03-05 NOTE — Progress Notes (Signed)
CARDIAC REHAB PHASE I  ? ?Reinforced HF education with pt. Stressed importance of med compliance. Pt denies further questions or concerns. D/c today. ? ?1010-1035 ?Reynold Bowen, RN BSN ?03/05/2022 ?10:35 AM ? ?

## 2022-03-05 NOTE — Progress Notes (Addendum)
? ? Advanced Heart Failure Rounding Note ? ?PCP-Cardiologist: None  ? ?Subjective:   ? ?4/19 RHC/LHC with elevated filling pressures, moderately reduced CO, mild nonob CAD, and severe NICM. IV diuretics continued.  ? ?Diuresed well again yesterday, 7L in UOP.  Wt down 10 lb. 21 lb total   ? ?Scr trending down, 1.62>>1.52  ?K 3.9  ?Mg 2.4  ? ?Feels much better. Denies further dyspnea. Ambulating w/o difficulty. Ready to go home.  ? ? ? ?Objective:   ?Weight Range: ?126.9 kg ?Body mass index is 34.05 kg/m?.  ? ?Vital Signs:   ?Temp:  [97.9 ?F (36.6 ?C)-98.5 ?F (36.9 ?C)] 98.5 ?F (36.9 ?C) (04/21 [id number]) ?Pulse Rate:  [56-73] 56 (04/21 [id number]) ?Resp:  [18-20] 18 (04/21 [id number]) ?BP: (103-131)/(60-88) 103/68 (04/21 [id number]) ?SpO2:  [93 %-98 %] 95 % (04/21 [id number]) ?Weight:  [126.9 kg] 126.9 kg (04/21 [id number]) ?Last BM Date : 03/03/22 ? ?Weight change: ?Filed Weights  ? 03/03/22 0515 03/04/22 0459 03/05/22 0611  ?Weight: 131.5 kg 131.4 kg 126.9 kg  ? ? ?Intake/Output:  ? ?Intake/Output Summary (Last 24 hours) at 03/05/2022 0915 ?Last data filed at 03/05/2022 0600 ?Gross per 24 hour  ?Intake 780 ml  ?Output 6800 ml  ?Net -6020 ml  ?  ? ? ?Physical Exam  ? ?General:  Well appearing. No respiratory difficulty ?HEENT: normal ?Neck: supple. no JVD. Carotids 2+ bilat; no bruits. No lymphadenopathy or thyromegaly appreciated. ?Cor: PMI nondisplaced. Regular rate & rhythm. No rubs, gallops or murmurs. ?Lungs: clear ?Abdomen: soft, nontender, nondistended. No hepatosplenomegaly. No bruits or masses. Good bowel sounds. ?Extremities: no cyanosis, clubbing, rash, edema ?Neuro: alert & oriented x 3, cranial nerves grossly intact. moves all 4 extremities w/o difficulty. Affect pleasant. ? ?Telemetry  ? ?NSR 80s  ? ?EKG  ?  ?N/A ? ?Labs  ?  ?CBC ?Recent Labs  ?  03/03/22 ?1738 03/03/22 ?1926  ?WBC  --  7.1  ?HGB 14.6  14.6 14.4  ?HCT 43.0  43.0 43.8  ?MCV  --  86.2  ?PLT  --  235  ? ?Basic Metabolic Panel ?Recent Labs  ?  03/04/22 ?0455  03/05/22 ?0359  ?NA 137 137  ?K 4.5 3.9  ?CL 106 99  ?CO2 24 28  ?GLUCOSE 109* 111*  ?BUN 23* 30*  ?CREATININE 1.62* 1.52*  ?CALCIUM 8.6* 9.2  ?MG 2.3 2.4  ? ?Liver Function Tests ?No results for input(s): AST, ALT, ALKPHOS, BILITOT, PROT, ALBUMIN in the last 72 hours. ?No results for input(s): LIPASE, AMYLASE in the last 72 hours. ?Cardiac Enzymes ?No results for input(s): CKTOTAL, CKMB, CKMBINDEX, TROPONINI in the last 72 hours. ? ?BNP: ?BNP (last 3 results) ?Recent Labs  ?  04/24/21 ?1534 02/28/22 ?1436  ?BNP 92.4 1,287.2*  ? ? ?ProBNP (last 3 results) ?No results for input(s): PROBNP in the last 8760 hours. ? ? ?D-Dimer ?No results for input(s): DDIMER in the last 72 hours. ?Hemoglobin A1C ?No results for input(s): HGBA1C in the last 72 hours. ? ?Fasting Lipid Panel ?No results for input(s): CHOL, HDL, LDLCALC, TRIG, CHOLHDL, LDLDIRECT in the last 72 hours. ? ?Thyroid Function Tests ?No results for input(s): TSH, T4TOTAL, T3FREE, THYROIDAB in the last 72 hours. ? ?Invalid input(s): FREET3 ? ?Other results: ? ? ?Imaging  ? ? ?No results found. ? ? ?Medications:   ? ? ?Scheduled Medications: ? aspirin EC  81 mg Oral Daily  ? atorvastatin  40 mg Oral Daily  ? carvedilol  6.25 mg Oral BID WC  ?  dapagliflozin propanediol  10 mg Oral Daily  ? enoxaparin (LOVENOX) injection  40 mg Subcutaneous Q24H  ? insulin aspart  0-15 Units Subcutaneous TID WC  ? insulin glargine-yfgn  18 Units Subcutaneous QHS  ? mouth rinse  15 mL Mouth Rinse BID  ? metolazone  5 mg Oral Daily  ? potassium chloride  40 mEq Oral BID  ? sacubitril-valsartan  1 tablet Oral BID  ? sodium chloride flush  3 mL Intravenous Q12H  ? spironolactone  25 mg Oral Daily  ? torsemide  100 mg Oral Daily  ? ? ?Infusions: ? sodium chloride    ? ? ?PRN Medications: ?sodium chloride, acetaminophen, guaiFENesin-dextromethorphan, nitroGLYCERIN, sodium chloride flush ? ? ? ?Patient Profile  ? ?Douglas [name] is a 46 year old with h/o substance abuse, tobacco abuse,  obesity, VT, medtronic ICD, NICM, chronic HFrEF.He has been followed > 10 years in the HF clinic. ?  ?Admitted A/C HFrEF in the setting of inability to obtain medications ? ?Assessment/Plan  ? ?1. Acute on Chronic HFrEF ?- NICM by cath in 2006. Medtronic ICD.  ?- Echo [id number] EF 30-35%.  ?- Echo 04/23: EF 25-30%, LV severely dilated, RV moderately down, moderate to severe Douglas ?- Admitted with volume overload. BNP > 1200.  ?- S/P RHC/LHC with elevated filling pressures, moderately reduced CO, mild nonob CAD, and severe NICM. RA 14/PCWP 26. Diuresed well w/ IV Lasix. Diuresed 21 lb.  ?- Switch back to Torsemide, 100 mg daily   ?- Continue lower dose of coreg at 6.25 mg twice a day.  ?- Continue entresto 49/51 mg BID ?- Continue spiro 25 mg daily ?- Continue farxiga 10 mg daily ? ?  ?2. NSVT ?- improved, no recurrence on tele  ?- Continue coreg at current dose.  ?- K and Mg stable  ? ? ?3. DMII ?- SSI  ?- Hgb A1C 7.4  ?- Continue SGLT2I ?  ?4. Hyperlipidemia ?- LDL 131 ?- Continue atorvastatin. ?  ?5. Substance Abuse ?- UDS + Cocaine ?- Discussed cessation  ?  ?6.Tobacco Abuse ?- Discussed need for cessation ?  ?7. CAD ?-Mild non ob CAD cath  ?-Continue asa + statin ? ?8. SDOH ?-Homeless. Consulted TOC CSW. Referring to [facility] at discharge. ?-He has been referred to HF Paramedicine.  ?-Has medicaid ? ?Stable for d/c home today. Hospital f/u has been arranged.  ? ? ?Length of Stay: 5 ? ?Douglas Jester, PA-C  ?03/05/2022, 9:15 AM ? ?Advanced Heart Failure Team ?Pager [phone] (M-F; 7a - 5p)  ?Please contact [facility] for night-coverage after hours (5p -7a ) and weekends on [email or web address] ? ?Agree with the above PA note.  ? ?Patient to be discharged today.  Hospital followup and meds for discharge arranged.  ? ?Douglas [name] ?03/05/2022 ? ? ? ? ? ?

## 2022-03-05 NOTE — TOC Transition Note (Signed)
Transition of Care (TOC) - CM/SW Discharge Note ? ? ?Patient Details  ?Name: Douglas [name] ?MRN: [id number] ?Date of Birth: 1976/06/16 ? ?Transition of Care (TOC) CM/SW Contact:  ?Zenon Mayo, RN ?Phone Number: ?03/05/2022, 10:06 AM ? ? ?Clinical Narrative:    ?Patient is for dc today, he states his cousin is coming to transport him home today.  TOC to fill medications.  ? ? ?  ?Barriers to Discharge: Continued Medical Work up ? ? ?Patient Goals and CMS Choice ?Patient states their goals for this hospitalization and ongoing recovery are:: patient concerned about housing and not feeling well ?CMS Medicare.gov Compare Post Acute Care list provided to:: Patient ?Choice offered to / list presented to : Patient ? ?Discharge Placement ?  ?           ?  ?  ?  ?  ? ?Discharge Plan and Services ?In-house Referral: Clinical Social Work ?Discharge Planning Services: CM Consult ?           ?  ?  ?  ?  ?  ?  ?  ?  ?  ?  ? ?Social Determinants of Health (SDOH) Interventions ?Financial Strain Interventions: Development worker, community ?Housing Interventions: Other (Comment) (provided shelter resources) ?Transportation Interventions: Intervention Not Indicated ? ? ?Readmission Risk Interventions ?   ? View : No data to display.  ?  ?  ?  ? ? ? ? ? ?

## 2022-03-08 ENCOUNTER — Telehealth: Payer: Self-pay

## 2022-03-08 NOTE — Telephone Encounter (Signed)
Transition Care Management Unsuccessful Follow-up Telephone Call ? ?Date of discharge and from where:  03/05/2022 from [facility] ? ?Attempts:  1st Attempt ? ?Reason for unsuccessful TCM follow-up call:  Unable to leave message ? ? ? ?

## 2022-03-08 NOTE — Telephone Encounter (Signed)
Transition Care Management Unsuccessful Follow-up Telephone Call ? ?Date of discharge and from where:  03/05/2022, [facility]  ? ?Attempts:  1st Attempt ? ?Reason for unsuccessful TCM follow-up call:  Unable to leave message, voicemail not set up # [phone] ? ? ? ?

## 2022-03-09 ENCOUNTER — Telehealth: Payer: Self-pay

## 2022-03-09 NOTE — Telephone Encounter (Signed)
Transition Care Management Unsuccessful Follow-up Telephone Call ? ?Date of discharge and from where:  03/05/2022 from [facility] ? ?Attempts:  2nd Attempt ? ?Reason for unsuccessful TCM follow-up call:  Unable to leave message ? ? ? ?

## 2022-03-09 NOTE — Telephone Encounter (Signed)
Transition Care Management Unsuccessful Follow-up Telephone Call ? ?Date of discharge and from where:  03/05/2022, [facility]  ? ?Attempts:  2nd Attempt ? ?Reason for unsuccessful TCM follow-up call:  Unable to leave message, ?voicemail not set up # [phone]. ? ?Need to discuss scheduling a hospital follow up visit with PCP ? ? ?

## 2022-03-10 ENCOUNTER — Telehealth: Payer: Self-pay

## 2022-03-10 NOTE — Telephone Encounter (Signed)
Pt returning call requesting a call back.  ? ? [phone]) [phone] ?

## 2022-03-10 NOTE — Telephone Encounter (Signed)
Attempted to call patient back #  [phone], voicemail not set up.  ?

## 2022-03-10 NOTE — Telephone Encounter (Signed)
Pt returning phone call ° °

## 2022-03-10 NOTE — Telephone Encounter (Signed)
Transition Care Management Unsuccessful Follow-up Telephone Call ? ?Date of discharge and from where:  03/05/2022, [facility]  ? ?Attempts:  3rd Attempt ? ?Reason for unsuccessful TCM follow-up call:  Unable to leave message - voicemail not set up # [phone]. ? ?Letter sent to patient requesting he contact [facility] to schedule a follow up appointment as we have not been able to reach him.  ? ? ? ?

## 2022-03-11 ENCOUNTER — Telehealth: Payer: Self-pay

## 2022-03-11 NOTE — Telephone Encounter (Signed)
Call placed to patient.  Since I spoke to him, he has scheduled his follow up appointment at [facility] with Bertram Denver, NP - 03/31/2022. He said he has transportation and all of his medications.  He is also aware that he has an appointment with [facility] 03/17/2022. ?

## 2022-03-11 NOTE — Telephone Encounter (Signed)
Opened in error

## 2022-03-16 ENCOUNTER — Telehealth (HOSPITAL_COMMUNITY): Payer: Self-pay

## 2022-03-16 NOTE — Telephone Encounter (Signed)
Called to confirm/remind patient of their appointment at the [facility] on 03/17/22.  ? ?Patient reminded to bring all medications and/or complete list. ? ?Confirmed patient has transportation. Gave directions, instructed to utilize [address] parking. ? ?Confirmed appointment prior to ending call.  ? ?

## 2022-03-17 ENCOUNTER — Other Ambulatory Visit (HOSPITAL_COMMUNITY): Payer: Self-pay | Admitting: Emergency Medicine

## 2022-03-17 ENCOUNTER — Ambulatory Visit (INDEPENDENT_AMBULATORY_CARE_PROVIDER_SITE_OTHER): Payer: Medicaid Other

## 2022-03-17 ENCOUNTER — Encounter (HOSPITAL_COMMUNITY): Payer: Self-pay

## 2022-03-17 ENCOUNTER — Ambulatory Visit (HOSPITAL_COMMUNITY)
Admit: 2022-03-17 | Discharge: 2022-03-17 | Disposition: A | Discharge status: Home / Self Care | Payer: Medicaid Other | Attending: Family Medicine | Admitting: Family Medicine

## 2022-03-17 VITALS — BP 100/70 | HR 71 | Wt 299.6 lb

## 2022-03-17 DIAGNOSIS — E669 Obesity, unspecified: Secondary | ICD-10-CM | POA: Insufficient documentation

## 2022-03-17 DIAGNOSIS — I428 Other cardiomyopathies: Secondary | ICD-10-CM | POA: Insufficient documentation

## 2022-03-17 DIAGNOSIS — Z91199 Patient's noncompliance with other medical treatment and regimen due to unspecified reason: Secondary | ICD-10-CM | POA: Insufficient documentation

## 2022-03-17 DIAGNOSIS — Z7901 Long term (current) use of anticoagulants: Secondary | ICD-10-CM | POA: Diagnosis not present

## 2022-03-17 DIAGNOSIS — E1122 Type 2 diabetes mellitus with diabetic chronic kidney disease: Secondary | ICD-10-CM

## 2022-03-17 DIAGNOSIS — I2510 Atherosclerotic heart disease of native coronary artery without angina pectoris: Secondary | ICD-10-CM | POA: Diagnosis not present

## 2022-03-17 DIAGNOSIS — I5042 Chronic combined systolic (congestive) and diastolic (congestive) heart failure: Secondary | ICD-10-CM

## 2022-03-17 DIAGNOSIS — Z794 Long term (current) use of insulin: Secondary | ICD-10-CM | POA: Diagnosis not present

## 2022-03-17 DIAGNOSIS — Z79899 Other long term (current) drug therapy: Secondary | ICD-10-CM | POA: Diagnosis not present

## 2022-03-17 DIAGNOSIS — I5022 Chronic systolic (congestive) heart failure: Secondary | ICD-10-CM | POA: Insufficient documentation

## 2022-03-17 DIAGNOSIS — I472 Ventricular tachycardia, unspecified: Secondary | ICD-10-CM | POA: Insufficient documentation

## 2022-03-17 DIAGNOSIS — F1410 Cocaine abuse, uncomplicated: Secondary | ICD-10-CM | POA: Diagnosis not present

## 2022-03-17 DIAGNOSIS — I110 Hypertensive heart disease with heart failure: Secondary | ICD-10-CM | POA: Diagnosis present

## 2022-03-17 DIAGNOSIS — I4729 Other ventricular tachycardia: Secondary | ICD-10-CM

## 2022-03-17 DIAGNOSIS — F17200 Nicotine dependence, unspecified, uncomplicated: Secondary | ICD-10-CM | POA: Diagnosis not present

## 2022-03-17 DIAGNOSIS — E119 Type 2 diabetes mellitus without complications: Secondary | ICD-10-CM | POA: Diagnosis not present

## 2022-03-17 DIAGNOSIS — Z720 Tobacco use: Secondary | ICD-10-CM

## 2022-03-17 DIAGNOSIS — F1490 Cocaine use, unspecified, uncomplicated: Secondary | ICD-10-CM

## 2022-03-17 DIAGNOSIS — Z8616 Personal history of COVID-19: Secondary | ICD-10-CM | POA: Insufficient documentation

## 2022-03-17 DIAGNOSIS — Z95810 Presence of automatic (implantable) cardiac defibrillator: Secondary | ICD-10-CM | POA: Diagnosis not present

## 2022-03-17 DIAGNOSIS — E785 Hyperlipidemia, unspecified: Secondary | ICD-10-CM | POA: Insufficient documentation

## 2022-03-17 DIAGNOSIS — Z139 Encounter for screening, unspecified: Secondary | ICD-10-CM

## 2022-03-17 DIAGNOSIS — N182 Chronic kidney disease, stage 2 (mild): Secondary | ICD-10-CM

## 2022-03-17 LAB — CUP PACEART REMOTE DEVICE CHECK
Battery Remaining Longevity: 29 mo
Battery Voltage: 2.9 V
Brady Statistic RV Percent Paced: 0.03 %
Date Time Interrogation Session: 20230503110430
HighPow Impedance: 48 Ohm
HighPow Impedance: 62 Ohm
Implantable Lead Implant Date: 20141009
Implantable Lead Location: 753860
Implantable Lead Model: 7121
Implantable Pulse Generator Implant Date: 20141009
Lead Channel Impedance Value: 285 Ohm
Lead Channel Impedance Value: 399 Ohm
Lead Channel Pacing Threshold Amplitude: 0.75 V
Lead Channel Pacing Threshold Pulse Width: 0.4 ms
Lead Channel Sensing Intrinsic Amplitude: 11.75 mV
Lead Channel Sensing Intrinsic Amplitude: 11.75 mV
Lead Channel Setting Pacing Amplitude: 2.5 V
Lead Channel Setting Pacing Pulse Width: 0.4 ms
Lead Channel Setting Sensing Sensitivity: 0.45 mV

## 2022-03-17 LAB — BASIC METABOLIC PANEL
Anion gap: 10 (ref 5–15)
BUN: 26 mg/dL — ABNORMAL HIGH (ref 6–20)
CO2: 23 mmol/L (ref 22–32)
Calcium: 8.6 mg/dL — ABNORMAL LOW (ref 8.9–10.3)
Chloride: 105 mmol/L (ref 98–111)
Creatinine, Ser: 1.35 mg/dL — ABNORMAL HIGH (ref 0.61–1.24)
GFR, Estimated: >60 mL/min (ref >60)
Glucose, Bld: 134 mg/dL — ABNORMAL HIGH (ref 70–99)
Potassium: 4.2 mmol/L (ref 3.5–5.1)
Sodium: 138 mmol/L (ref 135–145)

## 2022-03-17 LAB — BRAIN NATRIURETIC PEPTIDE: B Natriuretic Peptide: 253.3 pg/mL — ABNORMAL HIGH (ref 0.0–100.0)

## 2022-03-17 NOTE — Progress Notes (Signed)
? ?  ?ADVANCED HF CLINIC NOTE ? ?Patient ID: Douglas [name], male   DOB: [birth year], 46 y.o.   MRN: [id number] ? ?PCP: [name] NP ?EP: Dr Caryl Comes ?HF Cardiologist: Dr. Haroldine Laws ? ?HPI: ?[name] is a 46 y.o. male with a h/o HTN, HL, obesity, smoker, cocaine abuse,  NICM and chronic systolic heart failure. He is S/P Medtronic ICD due to NICM. Management has been complicated by severe non-compliance.  ? ?Underwent cath in 2006 which showed normal cors with EF 20-25%.   EF had recovered in late 2013 but has again fallen to 15% per echo 01/20/13. ? ?He was admitted to [facility] 02/21/13 from clinic for low output symptoms and volume overload.  He required IV lasix and metolazone.  He diuresed 19 pounds with discharge weight of 254 pounds.  He had runs of VT therefore he was started on amiodarone per EP and Lifevest was placed at discharge.  He underwent RHC on 02/22/13.  He has not required metolazone.  ? ?He was admitted in 1/16 with VT and ICD shock x 2.  ? ?Echo 6/20 -->EF 25-30%  ? ?He was COVID + 11/14/20. He quarantined and recovered.  ? ?Echo 04/24/21 EF 30-35% RV ok.  ? ?Last seen in clinic 6/22, euvolemic with stable NYHA II symptoms. 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 ? ?Admitted 4/23 with a/c CHF after not taking meds x 1 week. UDS + cocaine. Echo showed EF 25-30%, LV severely dilated, RV moderately down, moderate to severe MR. Underwent R/LHC showing elevated filling pressures, moderately reduced CO, and mild non obs CAD. He was diuresed with IV lasix, down 21 lbs. GDMT titrated and paramedicine arranged. Discharged home, weight 279 lbs. ? ?Today he returns for post hospital HF follow up, here with paramedicine, [place]. Overall feeling fine. 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 No SOB with activity. Denies palpitations, CP, dizziness, edema, or PND/Orthopnea. Appetite ok. No fever or chills. Not weighing at home. Taking all  medications. Last used cocaine a week. Smoking 2-3 cigs/day.  ? ?Cardiac Studies: ?- Echo (4/23): EF 25-30%, LV severely dilated, RV moderately down, moderate to severe MR ? ?- R/LHC (4/23):  ?  Prox RCA lesion is 30% stenosed. ?  Dist RCA lesion is 50% stenosed. ?  Ost Cx to Prox Cx lesion is 20% stenosed. ?  Prox LAD to Mid LAD lesion is 40% stenosed. ?  Dist LAD lesion is 20% stenosed. ?  The left ventricular ejection fraction is 25-35% by visual estimate. ?  ?Ao = 97/76 (85) ?LV = 98/30 ?RA =  14 ?RV = 66/17 ?PA = 66/26 (47) ?PCW = 26 ?Fick cardiac output/index = 5.6/2.1 ?PVR = 4.0 WU ?SVR = 983 ?Ao sat = 92% ?PA sat = 60%, 65% ?  ?1. Severe NICM EF 25% ?2. Mild non-obstructive CAD ?3. Elevated filling pressures with moderately reduced CO ?4. Moderate mixed pulmonary HTN ? ?- RHC 02/22/13  ?RA = 12  ?RV = 49/6/15  ?PA = 54/29 (41)  ?PCW = 28 (v= 40)  ?Fick cardiac output/index = 4.3/1.7  ?PVR = 2.6 Sherral Hammers  ?SVR = 1580 dynes  ?FA sat = 98%  ?PA sat = 53%, 54%  ?Non-invasive BP = 124/86 (97) ? ?- RHC 05/03/13 ?RA = 2  ?RV = 29/2/2  ?PA = 26/10 (17)  ?PCW = 3  ?Fick cardiac output/index = 6.1/2.3  ?PVR = 2.3  ?  FA sat = 93%  ?PA sat = 65%, 67% ? ?- ECHO 06/25/13 EF 20-25% ?- ECHO 05/01/14 EF 35-40%, moderately dilated LV, mild LVH, diffuse hypokinesis ?- Echo 6/16 EF 25-30%, mild LVH, moderately dilated LV.  ?- ECHO 09/23/2016 EF 25-30%. ?- ECHO 08/19/2017 EF 40-45% grade IDD ? ?- CPX 06/2011 VO2 20 ? ?SH: lives with his aunt. Rare ETOH, smokes rarely. Frequent cocaine. ?FH: Mom has CAD.   ? ?Review of systems complete and found to be negative unless listed in HPI.  ? ?Past Medical History:  ?Diagnosis Date  ? Depression, major, single episode 01/14/2017  ? Diabetes mellitus ([name])   ? ED (erectile dysfunction)   ? History of syncope 01/29/2015  ? Hypertension   ? ICD (implantable cardioverter-defibrillator) discharge 11/30/2014  ? On 11/30/14. Asymptomatic.   ? Nonischemic cardiomyopathy ([place])   ? a.  echo 4/06: EF 30%, mild to  mod MR, mild RAE, inf HK, lat HK , ant HK;    b.  cath 4/06: no CAD, EF 20-25%  ? NSVT (nonsustained ventricular tachycardia) ([place])   ? Obesity   ? Systolic CHF, chronic ([place])   ? EF 11/17 25-30%, s/p ICD  ? ? ?Current Outpatient Medications  ?Medication Sig Dispense Refill  ? Accu-Chek Softclix Lancets lancets Check blood sugar twice daily E11.8 100 each 2  ? acetaminophen (TYLENOL) 500 MG tablet Take 1 tablet (500 mg total) by mouth every 6 (six) hours as needed. (Patient taking differently: Take 500 mg by mouth every 6 (six) hours as needed for moderate pain.) 30 tablet 0  ? aspirin 81 MG EC tablet Take 1 tablet (81 mg total) by mouth daily. 30 tablet 5  ? atorvastatin (LIPITOR) 40 MG tablet Take 1 tablet (40 mg total) by mouth daily. 30 tablet 6  ? Blood Glucose Calibration (ACCU-CHEK GUIDE CONTROL) LIQD 1 each by In Vitro route once as needed for up to 1 dose. 1 each 0  ? Blood Glucose Monitoring Suppl (ACCU-CHEK GUIDE ME) w/Device KIT Check blood sugar twice daily E11.8 1 kit 0  ? Blood Pressure Monitor DEVI Please provide patient with insurance approved blood pressure monitor I10.0 1 each 0  ? carvedilol (COREG) 6.25 MG tablet Take 1 tablet (6.25 mg total) by mouth 2 (two) times daily with a meal. 60 tablet 6  ? clotrimazole-betamethasone (LOTRISONE) cream Apply 1 application topically daily. (Patient taking differently: Apply 1 application. topically as needed.) 60 g 1  ? dapagliflozin propanediol (FARXIGA) 10 MG TABS tablet Take 1 tablet (10 mg total) by mouth daily before breakfast. 90 tablet 3  ? fluticasone (FLONASE) 50 MCG/ACT nasal spray Place 2 sprays into both nostrils daily. (Patient taking differently: Place 2 sprays into both nostrils as needed.) 16 g 6  ? glucose blood (ACCU-CHEK GUIDE) test strip Check blood sugar twice daily E11.8 100 each 0  ? Insulin Glargine (BASAGLAR KWIKPEN) 100 UNIT/ML Inject 36 Units into the skin daily. must have office visit for refills. (Patient taking differently:  Inject 36 Units into the skin every morning.) 15 mL 0  ? Insulin Pen Needle 32G X 4 MM MISC INJECT 1 APPLICATION INTO THE SKIN DAILY. THE PATIENT IS INSULIN REQUIRING, ICD 10 CODE E11.9. THE PATIENT INJECTS 1 TIMES PER DAY. 100 each 3  ? potassium chloride SA (KLOR-CON M) 20 MEQ tablet TAKE 2 TABLETS (40 MEQ TOTAL) BY MOUTH 2 (TWO) TIMES DAILY. (Patient taking differently: 40 mEq 2 (two) times daily.) 120 tablet 11  ? sacubitril-valsartan (ENTRESTO)  49-51 MG TAKE 1 TABLET BY MOUTH 2 (TWO) TIMES DAILY. 60 tablet 11  ? sildenafil (VIAGRA) 100 MG tablet Take 1 tablet (100 mg total) by mouth daily as needed for erectile dysfunction. 10 tablet 0  ? torsemide (DEMADEX) 100 MG tablet Take 1 tablet (100 mg total) by mouth daily. LAST REFILL NEEDS FOLLOW UP APPOINTMENT FOR ANYMORE REFILLS 30 tablet 6  ? ?No current facility-administered medications for this encounter.  ? ?Allergies  ?Allergen Reactions  ? Bydureon [Exenatide] Rash  ? Penicillins Other (See Comments)  ?  Unknown childhood reaction ?  ? ?BP 100/70   Pulse 71   Wt 135.9 kg   SpO2 97%   BMI 36.47 kg/m?  ? ?Wt Readings from Last 3 Encounters:  ?03/17/22 135.9 kg  ?03/05/22 126.9 kg  ?09/02/21 (!) 142.4 kg  ? ?Physical Exam: ?General:  NAD. No resp difficulty ?HEENT: Normal ?Neck: Supple. No JVD. Carotids 2+ bilat; no bruits. No lymphadenopathy or thryomegaly appreciated. ?Cor: PMI nondisplaced. Regular rate & rhythm. No rubs, gallops or murmurs. ?Lungs: Clear ?Abdomen: Obese, nontender, nondistended. No hepatosplenomegaly. No bruits or masses. Good bowel sounds. ?Extremities: No cyanosis, clubbing, rash, edema ?Neuro: Alert & oriented x 3, cranial nerves grossly intact. Moves all 4 extremities w/o difficulty. Affect pleasant. ? ?ECG (personally reviewed): NSR w/ PVC, 71 bpm ? ?ICD interrogation (personally reviewed): OptiVol below threshold but trending up, thoracic impedence down, 2 hrs day/activity, no VT. ? ?Assessment/Plan: ?Chronic HFrEF ?- NICM by cath  in 2006. Medtronic ICD.  ?- Echo 33612 EF 30-35%.  ?- Echo (4/23): EF 25-30%, LV severely dilated, RV moderately down, moderate to severe MR ?- RHC/LHC (4/23): with elevated filling pressures, moderately reduce

## 2022-03-17 NOTE — Patient Instructions (Signed)
EKG done today. ? ?Labs done today. We will contact you only if your labs are abnormal. ? ?INCREASE Torsemide to 100mg  (1 tablet) by mouth every morning and 50mg  (1/2 tablet) by mouth every evening for 3 days THEN DECREASE to 100mg  (1 tablet) by mouth daily.  ? ?No other medication changes were made. Please continue all current medications as prescribed. ? ?Your physician recommends that you schedule a follow-up appointment in: 10-14 days for a lab only appointment and in 4-6 weeks with our NP/PA Clinic and in 3-4 months with Dr. [name].  ? ?If you have any questions or concerns before your next appointment please send [place] a message through [place] or call our office at [phone].   ? ?TO LEAVE A MESSAGE FOR THE NURSE SELECT OPTION 2, PLEASE LEAVE A MESSAGE INCLUDING: ?YOUR NAME ?DATE OF BIRTH ?CALL BACK NUMBER ?REASON FOR CALL**this is important as we prioritize the call backs ? ?YOU WILL RECEIVE A CALL BACK THE SAME DAY AS LONG AS YOU CALL BEFORE 4:00 PM ? ? ?Do the following things EVERYDAY: ?Weigh yourself in the morning before breakfast. Write it down and keep it in a log. ?Take your medicines as prescribed ?Eat low salt foods--Limit salt (sodium) to 2000 mg per day.  ?Stay as active as you can everyday ?Limit all fluids for the day to less than 2 liters ? ? ?At the [facility], you and your health needs are our priority. As part of our continuing mission to provide you with exceptional heart care, we have created designated Provider Care Teams. These Care Teams include your primary Cardiologist (physician) and Advanced Practice Providers (APPs- Physician Assistants and Nurse Practitioners) who all work together to provide you with the care you need, when you need it.  ? ?You may see any of the following providers on your designated Care Team at your next follow up: ?Dr [name] ?Dr Marca Ancona ?Tonye Becket, NP ?Robbie Lis, PA ?Karle Plumber, PharmD ? ? ?Please be sure to  bring in all your medications bottles to every appointment.  ? ?

## 2022-03-17 NOTE — Progress Notes (Signed)
Paramedicine Encounter ? ? ?Patient ID: Douglas [name] , male,   DOB: Jun 13, 1976,45 y.o.,  MRN: [id number] ? ? ?Met patient in clinic today with Douglas [name].  Pt denies chest pain or SOB.  Lung sounds clear and equal bilaterally and unremarkable edema to lower extremities.  Lung sounds clear throughout.  No exertional SOB.  Pt. Instructed to add 1/2 tablet of Spironlactone.  Also, Pt taking 152m. Torsemide in the a.m. and instructed to add and additional 540m Torsemide in p.m. for the next 3 days, then back to 10018mab daily.  [name] currently staying with his aunt.  He states he is getting ready to start a new job at [employer] discussed with him scheduling a home visit and he stated he needed to find out his new schedule with his job.  I advised him I will follow up with him tomorrow to check his availability for a home visit.  He does desire that I set him up with a pill box for his medications.   Also gave pt a scale and requested that he weigh daily.  Time spent with patient 35 minutes ? ? ? ?DedRenee RamusMT-Paramedic  ?[id number] 6310/01/2022  ?

## 2022-03-23 ENCOUNTER — Telehealth (HOSPITAL_COMMUNITY): Payer: Self-pay | Admitting: Emergency Medicine

## 2022-03-23 NOTE — Telephone Encounter (Signed)
Called @ 10:48 this morning.  No answer.  LVM ?

## 2022-03-26 ENCOUNTER — Other Ambulatory Visit: Payer: Self-pay | Admitting: Internal Medicine

## 2022-03-29 ENCOUNTER — Other Ambulatory Visit (HOSPITAL_COMMUNITY): Payer: Medicaid Other

## 2022-03-30 ENCOUNTER — Other Ambulatory Visit: Payer: Self-pay | Admitting: Obstetrics and Gynecology

## 2022-03-30 NOTE — Patient Instructions (Signed)
Visit Information ? ?[name]  - as a part of your Medicaid benefit, you are eligible for care management and care coordination services at no cost or copay. I was unable to reach you by phone today but would be happy to help you with your health related needs. Please feel free to call me at [phone] ? ?Kathi Der RN, BSN ?[place]  [facility] ?Care Management Coordinator - Managed Medicaid High Risk ?[phone] ?  ? ?

## 2022-03-30 NOTE — Patient Outreach (Signed)
Care Coordination ? ?03/30/2022 ? ?Rally Mardene Celeste ?10/03/1976 ?[id number] ? ? ?Medicaid Managed Care  ? ?Unsuccessful Outreach Note ? ?03/30/2022 ?Name: Douglas [name] MRN: [id number] DOB: 14-Oct-1976 ? ?Referred by: Claiborne Rigg, NP ?Reason for referral : High Risk Managed Medicaid (Unsuccessful telephone outreach) ? ? ?Third unsuccessful telephone outreach was attempted today. The patient was referred to the case management team for assistance with care management and care coordination. The patient's primary care provider has been notified of our unsuccessful attempts to make or maintain contact with the patient. The care management team is pleased to engage with this patient at any time in the future should he/she be interested in assistance from the care management team.  ? ?Follow Up Plan: We have been unable to make contact with the patient for follow up. The care management team is available to follow up with the patient after provider conversation with the patient regarding recommendation for care management engagement and subsequent re-referral to the care management team.  ? ?Kathi Der RN, BSN ?[place]  [facility] ?Care Management Coordinator - Managed Medicaid High Risk ?[phone] ?  ? ? ?

## 2022-03-31 ENCOUNTER — Encounter: Payer: Self-pay | Admitting: Nurse Practitioner

## 2022-03-31 ENCOUNTER — Ambulatory Visit: Payer: Medicaid Other | Attending: Nurse Practitioner | Admitting: Nurse Practitioner

## 2022-03-31 ENCOUNTER — Other Ambulatory Visit: Payer: Self-pay | Admitting: Family Medicine

## 2022-03-31 ENCOUNTER — Other Ambulatory Visit: Payer: Self-pay

## 2022-03-31 VITALS — BP 107/69 | HR 68 | Temp 97.0°F | Wt 297.0 lb

## 2022-03-31 DIAGNOSIS — I10 Essential (primary) hypertension: Secondary | ICD-10-CM

## 2022-03-31 DIAGNOSIS — I82409 Acute embolism and thrombosis of unspecified deep veins of unspecified lower extremity: Secondary | ICD-10-CM

## 2022-03-31 DIAGNOSIS — R569 Unspecified convulsions: Secondary | ICD-10-CM | POA: Insufficient documentation

## 2022-03-31 DIAGNOSIS — I5042 Chronic combined systolic (congestive) and diastolic (congestive) heart failure: Secondary | ICD-10-CM

## 2022-03-31 DIAGNOSIS — Z09 Encounter for follow-up examination after completed treatment for conditions other than malignant neoplasm: Secondary | ICD-10-CM

## 2022-03-31 MED ORDER — SPIRONOLACTONE 25 MG PO TABS
25.0000 mg | ORAL_TABLET | Freq: Every day | ORAL | 0 refills | Status: DC
  Filled 2022-03-31: qty 90, 90d supply, fill #0
  Filled 2022-04-27: qty 30, 30d supply, fill #0

## 2022-03-31 NOTE — Progress Notes (Signed)
Remote ICD transmission.   

## 2022-03-31 NOTE — Progress Notes (Signed)
Hospital follow up?

## 2022-03-31 NOTE — Progress Notes (Signed)
Assessment & Plan:  Douglas [name] was seen today for diabetes and hospitalization follow-up.  Diagnoses and all orders for this visit:  Hospital discharge follow-up  Primary hypertension -     spironolactone (ALDACTONE) 25 MG tablet; Take 1 tablet (25 mg total) by mouth daily. DASH DIET  Chronic combined systolic and diastolic congestive heart failure (HCC) -     spironolactone (ALDACTONE) 25 MG tablet; Take 1 tablet (25 mg total) by mouth daily.    Patient has been counseled on age-appropriate routine health concerns for screening and prevention. These are reviewed and up-to-date. Referrals have been placed accordingly. Immunizations are up-to-date or declined.    Subjective:   Chief Complaint  Patient presents with   Diabetes   Hospitalization Follow-up   [name] 46 y.o. male presents to office today for HFU and DM.  He  has a past medical history of Depression, substance and tobacco abuse, major, single episode (01/14/2017), DM2, ED (erectile dysfunction), History of syncope (01/29/2015), Hypertension, ICD discharge (11/30/2014), Nonischemic cardiomyopathy, NSVT, Obesity, and Systolic CHF, chronic.   HFU Admitted for 5 days 4-16 through 4-21 with acute on chronic heart failure due to medication nonadherence. BNP >1200. He required IV lasix  (diuresed 21lbs)  and was transitioned to torsemide on discharge. Had RHC/LHC with elevated filling pressures, moderately reduced CO, mild nonob CAD, and severe NICM. EF 25-30%. He was instructed to follow up closely with the heart failure clinic upon dc.  UDS +cocaine.   He has no questions today regarding his HFU. Diet is not optimal as he works at [employer]. States they can eat the fried foods there for free but have to pay for the healthier options such as grilled chicken. He has been referred to the paramedicine program.  WEIGHT IS STABLE TODAY   DM 2 Diabetes not quite at goal. Needs to work on healthier diet.Currently taking  farxiga 10 mg daily, basaglar 36 units daily.  Lab Results  Component Value Date   HGBA1C 7.4 (H) 02/28/2022     HTN Blood pressure controlled. Taking coreg 6.25 mg BID, entresto 49-51 mg BID, spironolactone 25 mg daily and torsemdie 100 mg daily.  BP Readings from Last 3 Encounters:  04/01/22 (P) 100/70  03/31/22 107/69  03/17/22 100/70       Review of Systems  Constitutional:  Negative for fever, malaise/fatigue and weight loss.  HENT: Negative.  Negative for nosebleeds.   Eyes: Negative.  Negative for blurred vision, double vision and photophobia.  Respiratory: Negative.  Negative for cough and shortness of breath.   Cardiovascular: Negative.  Negative for chest pain, palpitations and leg swelling.  Gastrointestinal: Negative.  Negative for heartburn, nausea and vomiting.  Musculoskeletal: Negative.  Negative for myalgias.  Neurological: Negative.  Negative for dizziness, focal weakness, seizures and headaches.  Psychiatric/Behavioral: Negative.  Negative for suicidal ideas.    Past Medical History:  Diagnosis Date   Depression, major, single episode 01/14/2017   Diabetes mellitus [facility])    ED (erectile dysfunction)    History of syncope 01/29/2015   Hypertension    ICD (implantable cardioverter-defibrillator) discharge 11/30/2014   On 11/30/14. Asymptomatic.    Nonischemic cardiomyopathy ([place])    a.  echo 4/06: EF 30%, mild to mod MR, mild RAE, inf HK, lat HK , ant HK;    b.  cath 4/06: no CAD, EF 20-25%   NSVT (nonsustained ventricular tachycardia) (HCC)    Obesity    Systolic CHF, chronic ([place])  EF 11/17 25-30%, s/p ICD    Past Surgical History:  Procedure Laterality Date   CARDIAC CATHETERIZATION  09/2011; 02/2013; 04/2013   CARDIAC DEFIBRILLATOR PLACEMENT  08/23/2013   IMPLANTABLE CARDIOVERTER DEFIBRILLATOR IMPLANT N/A 08/23/2013   Procedure: IMPLANTABLE CARDIOVERTER DEFIBRILLATOR IMPLANT;  Surgeon: Deboraha Sprang, MD;  Location[facility] CATH LAB;  Service: Cardiovascular;   Laterality: N/A;   LEFT AND RIGHT HEART CATHETERIZATION WITH CORONARY ANGIOGRAM N/A 09/20/2011   Procedure: LEFT AND RIGHT HEART CATHETERIZATION WITH CORONARY ANGIOGRAM;  Surgeon: Jolaine Artist, MD;  Location: [facility] CATH LAB;  Service: Cardiovascular;  Laterality: N/A;   MULTIPLE EXTRACTIONS WITH ALVEOLOPLASTY N/A 01/26/2013   Procedure:  EXTRACION  TOOTH # 19 WITH ALVEOLOPLASTY;  Surgeon: Lenn Cal, DDS;  Location: [place];  Service: Oral Surgery;  Laterality: N/A;   RIGHT HEART CATHETERIZATION N/A 02/22/2013   Procedure: RIGHT HEART CATH;  Surgeon: Jolaine Artist, MD;  Location[facility] C[facility] LAB;  Service: Cardiovascular;  Laterality: N/A;   RIGHT HEART CATHETERIZATION N/A 05/03/2013   Procedure: RIGHT HEART CATH;  Surgeon: Jolaine Artist, MD;  Location: [facility] CATH LAB;  Service: Cardiovascular;  Laterality: N/A;   RIGHT/LEFT HEART CATH AND CORONARY ANGIOGRAPHY N/A 03/03/2022   Procedure: RIGHT/LEFT HEART CATH AND CORONARY ANGIOGRAPHY;  Surgeon: Jolaine Artist, MD;  Location: [place] CV LAB;  Service: Cardiovascular;  Laterality: N/A;    Family History  Problem Relation Age of Onset   Coronary artery disease Mother 34       s/p PCI   Lung cancer Father    Diabetes type II Maternal Uncle    Coronary artery disease Maternal Uncle    Stroke Neg Hx    Heart attack Neg Hx     Social History Reviewed with no changes to be made today.   Outpatient Medications Prior to Visit  Medication Sig Dispense Refill   Accu-Chek Softclix Lancets lancets Check blood sugar twice daily E11.8 100 each 2   acetaminophen (TYLENOL) 500 MG tablet Take 1 tablet (500 mg total) by mouth every 6 (six) hours as needed. (Patient taking differently: Take 500 mg by mouth every 6 (six) hours as needed for moderate pain.) 30 tablet 0   aspirin 81 MG EC tablet Take 1 tablet (81 mg total) by mouth daily. 30 tablet 5   atorvastatin (LIPITOR) 40 MG tablet Take 1 tablet (40 mg total) by mouth daily. 30 tablet 6    Blood Glucose Calibration (ACCU-CHEK GUIDE CONTROL) LIQD 1 each by In Vitro route once as needed for up to 1 dose. 1 each 0   Blood Glucose Monitoring Suppl (ACCU-CHEK GUIDE ME) w/Device KIT Check blood sugar twice daily E11.8 1 kit 0   carvedilol (COREG) 6.25 MG tablet Take 1 tablet (6.25 mg total) by mouth 2 (two) times daily with a meal. 60 tablet 6   dapagliflozin propanediol (FARXIGA) 10 MG TABS tablet Take 1 tablet (10 mg total) by mouth daily before breakfast. 90 tablet 3   glucose blood (ACCU-CHEK GUIDE) test strip Check blood sugar twice daily E11.8 100 each 0   Insulin Glargine (BASAGLAR KWIKPEN) 100 UNIT/ML Inject 36 Units into the skin daily. must have office visit for refills. (Patient taking differently: Inject 36 Units into the skin every morning.) 15 mL 0   Insulin Pen Needle 32G X 4 MM MISC INJECT 1 APPLICATION INTO THE SKIN DAILY. THE PATIENT IS INSULIN REQUIRING, ICD 10 CODE E11.9. THE PATIENT INJECTS 1 TIMES PER DAY. 100 each 3  potassium chloride SA (KLOR-CON M) 20 MEQ tablet TAKE 2 TABLETS (40 MEQ TOTAL) BY MOUTH 2 (TWO) TIMES DAILY. (Patient taking differently: 40 mEq 2 (two) times daily.) 120 tablet 11   sacubitril-valsartan (ENTRESTO) 49-51 MG TAKE 1 TABLET BY MOUTH 2 (TWO) TIMES DAILY. 60 tablet 11   torsemide (DEMADEX) 100 MG tablet Take 1 tablet (100 mg total) by mouth daily. LAST REFILL NEEDS FOLLOW UP APPOINTMENT FOR ANYMORE REFILLS 30 tablet 6   Blood Pressure Monitor DEVI Please provide patient with insurance approved blood pressure monitor I10.0 (Patient not taking: Reported on 04/01/2022) 1 each 0   clotrimazole-betamethasone (LOTRISONE) cream Apply 1 application topically daily. (Patient not taking: Reported on 04/01/2022) 60 g 1   fluticasone (FLONASE) 50 MCG/ACT nasal spray Place 2 sprays into both nostrils daily. (Patient not taking: Reported on 04/01/2022) 16 g 6   sildenafil (VIAGRA) 100 MG tablet Take 1 tablet (100 mg total) by mouth daily as needed for erectile  dysfunction. (Patient not taking: Reported on 04/01/2022) 10 tablet 0   spironolactone (ALDACTONE) 25 MG tablet Take 25 mg by mouth daily.     No facility-administered medications prior to visit.    Allergies  Allergen Reactions   Bydureon [Exenatide] Rash   Penicillins Other (See Comments)    Unknown childhood reaction        Objective:    BP 107/69   Pulse 68   Temp (!) 97 F (36.1 C) (Oral)   Wt 297 lb (134.7 kg)   SpO2 97%   BMI 36.15 kg/m  Wt Readings from Last 3 Encounters:  04/01/22 297 lb 9.6 oz (135 kg)  03/31/22 297 lb (134.7 kg)  03/17/22 299 lb 9.6 oz (135.9 kg)    Physical Exam Vitals and nursing note reviewed.  Constitutional:      Appearance: He is well-developed.  HENT:     Head: Normocephalic and atraumatic.  Cardiovascular:     Rate and Rhythm: Normal rate and regular rhythm.     Heart sounds: Normal heart sounds. No murmur heard.   No friction rub. No gallop.  Pulmonary:     Effort: Pulmonary effort is normal. No tachypnea or respiratory distress.     Breath sounds: Normal breath sounds. No decreased breath sounds, wheezing, rhonchi or rales.  Chest:     Chest wall: No tenderness.  Abdominal:     General: Bowel sounds are normal.     Palpations: Abdomen is soft.  Musculoskeletal:        General: Normal range of motion.     Cervical back: Normal range of motion.  Skin:    General: Skin is warm and dry.  Neurological:     Mental Status: He is alert and oriented to person, place, and time.     Coordination: Coordination normal.  Psychiatric:        Behavior: Behavior normal. Behavior is cooperative.        Thought Content: Thought content normal.        Judgment: Judgment normal.         Patient has been counseled extensively about nutrition and exercise as well as the importance of adherence with medications and regular follow-up. The patient was given clear instructions to go to ER or return to medical center if symptoms don't  improve, worsen or new problems develop. The patient verbalized understanding.   Follow-up: Return in about 3 months (around 07/01/2022).   Gildardo Pounds, FNP-BC [facility],  [place] [phone]   04/08/2022, 9:42 PM

## 2022-04-01 ENCOUNTER — Other Ambulatory Visit (HOSPITAL_COMMUNITY): Payer: Self-pay | Admitting: Emergency Medicine

## 2022-04-01 ENCOUNTER — Other Ambulatory Visit: Payer: Self-pay

## 2022-04-01 NOTE — Progress Notes (Signed)
Paramedicine Encounter    Patient ID: Douglas [name], male    DOB: 1976-09-28, 46 y.o.   MRN: [id number]   BP (P) 100/70 (BP Location: Right Arm, Patient Position: Sitting, Cuff Size: Normal)   Pulse (P) 76   Resp (P) 16   Wt 297 lb 9.6 oz (135 kg)   SpO2 (P) 93%   BMI 36.23 kg/m  CBG 115 Weight yesterday-297lbs  Last visit weight-299lbs  This was my first home visit with Douglas [name].  I met him on the front porch of his aunt's house which is where he's currently staying till he can find a place.  He has no complaints of chest pain or SOB.  Lung sounds clear and equal bilat.  He did have 2+ pitting edema to his lower extremities.  He said he wasn't that swollen this morning and that he's been on his feet a lot today.  Weight is good.  Reviewed medications with pt and he is currently taking his meds from the bottles and seems to be familiar with what each med is for.  He states he would like a pill box and I will get one from the clinic for his next visit.     Patient Care Team: Gildardo Pounds, NP as PCP - General (Nurse Practitioner) Calton Dach, MD as Referring Physician (Optometry) Bensimhon, Shaune Pascal, MD as Consulting Physician (Cardiology) Deboraha Sprang, MD as Consulting Physician (Cardiology)  Patient Active Problem List   Diagnosis Date Noted   Acute embolism and thrombosis of deep vein of lower extremity, unspecified laterality ([place]) 03/31/2022   Seizure ([place]) 03/31/2022   Acute on chronic combined systolic and diastolic CHF (congestive heart failure) ([place]) 02/28/2022   Onychomycosis 01/16/2017   Depression, major, single episode 01/14/2017   Allergic rhinitis 04/28/2015   Chest pain 04/21/2015   Prolonged Q-T interval on ECG 01/31/2015   ICD (MDT) in place 01/29/2015   Obesity 01/29/2015   Healthcare maintenance 12/11/2014   Cocaine use 06/23/2012   Tobacco use 09/20/2011   VT (ventricular tachycardia) ([place]) 09/19/2011   ED (erectile dysfunction)  08/02/2011   Chronic combined systolic and diastolic congestive heart failure ([place]) 06/21/2011   Diabetes mellitus with stage 2 chronic kidney disease ([place]) 04/28/2009   Hyperlipidemia 04/28/2009   Essential hypertension 04/28/2009    Current Outpatient Medications:    Accu-Chek Softclix Lancets lancets, Check blood sugar twice daily E11.8, Disp: 100 each, Rfl: 2   acetaminophen (TYLENOL) 500 MG tablet, Take 1 tablet (500 mg total) by mouth every 6 (six) hours as needed. (Patient taking differently: Take 500 mg by mouth every 6 (six) hours as needed for moderate pain.), Disp: 30 tablet, Rfl: 0   aspirin 81 MG EC tablet, Take 1 tablet (81 mg total) by mouth daily., Disp: 30 tablet, Rfl: 5   atorvastatin (LIPITOR) 40 MG tablet, Take 1 tablet (40 mg total) by mouth daily., Disp: 30 tablet, Rfl: 6   Blood Glucose Calibration (ACCU-CHEK GUIDE CONTROL) LIQD, 1 each by In Vitro route once as needed for up to 1 dose., Disp: 1 each, Rfl: 0   Blood Glucose Monitoring Suppl (ACCU-CHEK GUIDE ME) w/Device KIT, Check blood sugar twice daily E11.8, Disp: 1 kit, Rfl: 0   carvedilol (COREG) 6.25 MG tablet, Take 1 tablet (6.25 mg total) by mouth 2 (two) times daily with a meal., Disp: 60 tablet, Rfl: 6   dapagliflozin propanediol (FARXIGA) 10 MG TABS tablet, Take 1 tablet (10 mg total) by mouth daily  before breakfast., Disp: 90 tablet, Rfl: 3   glucose blood (ACCU-CHEK GUIDE) test strip, Check blood sugar twice daily E11.8, Disp: 100 each, Rfl: 0   Insulin Glargine (BASAGLAR KWIKPEN) 100 UNIT/ML, Inject 36 Units into the skin daily. must have office visit for refills. (Patient taking differently: Inject 36 Units into the skin every morning.), Disp: 15 mL, Rfl: 0   Insulin Pen Needle 32G X 4 MM MISC, INJECT 1 APPLICATION INTO THE SKIN DAILY. THE PATIENT IS INSULIN REQUIRING, ICD 10 CODE E11.9. THE PATIENT INJECTS 1 TIMES PER DAY., Disp: 100 each, Rfl: 3   potassium chloride SA (KLOR-CON M) 20 MEQ tablet, TAKE 2  TABLETS (40 MEQ TOTAL) BY MOUTH 2 (TWO) TIMES DAILY. (Patient taking differently: 40 mEq 2 (two) times daily.), Disp: 120 tablet, Rfl: 11   sacubitril-valsartan (ENTRESTO) 49-51 MG, TAKE 1 TABLET BY MOUTH 2 (TWO) TIMES DAILY., Disp: 60 tablet, Rfl: 11   spironolactone (ALDACTONE) 25 MG tablet, Take 1 tablet (25 mg total) by mouth daily., Disp: 90 tablet, Rfl: 0   torsemide (DEMADEX) 100 MG tablet, Take 1 tablet (100 mg total) by mouth daily. LAST REFILL NEEDS FOLLOW UP APPOINTMENT FOR ANYMORE REFILLS, Disp: 30 tablet, Rfl: 6   Blood Pressure Monitor DEVI, Please provide patient with insurance approved blood pressure monitor I10.0 (Patient not taking: Reported on 04/01/2022), Disp: 1 each, Rfl: 0   clotrimazole-betamethasone (LOTRISONE) cream, Apply 1 application topically daily. (Patient not taking: Reported on 04/01/2022), Disp: 60 g, Rfl: 1   fluticasone (FLONASE) 50 MCG/ACT nasal spray, Place 2 sprays into both nostrils daily. (Patient not taking: Reported on 04/01/2022), Disp: 16 g, Rfl: 6   sildenafil (VIAGRA) 100 MG tablet, Take 1 tablet (100 mg total) by mouth daily as needed for erectile dysfunction. (Patient not taking: Reported on 04/01/2022), Disp: 10 tablet, Rfl: 0 Allergies  Allergen Reactions   Bydureon [Exenatide] Rash   Penicillins Other (See Comments)    Unknown childhood reaction       Social History   Socioeconomic History   Marital status: Widowed    Spouse name: Not on file   Number of children: 5   Years of education: 40   Highest education level: Not on file  Occupational History   Occupation: [employer]ia    Comment: part-time  Tobacco Use   Smoking status: Every Day    Packs/day: 0.25    Years: 8.00    Pack years: 2.00    Types: Cigarettes   Smokeless tobacco: Never   Tobacco comments:    ~3 cigarettes a day  Vaping Use   Vaping Use: Never used  Substance and Sexual Activity   Alcohol use: Yes    Alcohol/week: 0.0 standard drinks    Comment: 2x week.     Drug use: Not Currently    Types: Cocaine    Comment: 08/10/17 - last use one week ago   Sexual activity: Yes  Other Topics Concern   Not on file  Social History Narrative   Referred to PCP- appointment made for 09/04/18 at 9:30am at [facility]      Provided with food pantry and free meal list- patient reported sometimes having issues paying for food but reports he does received food stamps and works part time- he does not pay for housing as he lives with his sister.      Patient uses his mother's car and reports no issues getting transport to to medical appointments.   Social Determinants of Health   Financial  Resource Strain: High Risk   Difficulty of Paying Living Expenses: Hard  Food Insecurity: No Food Insecurity   Worried About Charity fundraiser in the Last Year: Never true   Ran Out of Food in the Last Year: Never true  Transportation Needs: No Transportation Needs   Lack of Transportation (Medical): No   Lack of Transportation (Non-Medical): No  Physical Activity: Not on file  Stress: Not on file  Social Connections: Not on file  Intimate Partner Violence: Not on file    Physical Exam      Future Appointments  Date Time Provider [place]  04/28/2022  3:00 PM [facility] PA/NP [facility] None  06/16/2022 11:00 AM [facility] DEVICE REMOTES [facility]  07/02/2022  3:50 PM Gildardo Pounds, NP [facility] None  07/22/2022  2:20 PM Bensimhon, Shaune Pascal, MD [facility] None  09/15/2022 11:00 AM [facility] DEVICE REMOTES [facility]  12/15/2022 11:00 AM [facility] DEVICE REMOTES [facility]  03/16/2023 11:00 AM [facility] DEVICE REMOTES [facility]       Renee Ramus, [place] Paramedic  04/01/22

## 2022-04-06 ENCOUNTER — Telehealth (HOSPITAL_COMMUNITY): Payer: Self-pay | Admitting: Licensed Clinical Social Worker

## 2022-04-06 NOTE — Telephone Encounter (Signed)
CSW informed that the [facility] has been trying to reach patient to follow up on disability application and requested to assist with contacting patient. CSW attempted to contact patient with no answer and message left. Raquel Sarna, [place], [place]

## 2022-04-08 ENCOUNTER — Other Ambulatory Visit: Payer: Self-pay

## 2022-04-08 ENCOUNTER — Encounter: Payer: Self-pay | Admitting: Nurse Practitioner

## 2022-04-13 ENCOUNTER — Telehealth (HOSPITAL_COMMUNITY): Payer: Self-pay | Admitting: Emergency Medicine

## 2022-04-13 NOTE — Telephone Encounter (Signed)
Called  @ 11:37 Voicemail box full and could not leave message.  Text him to call me to give me a time to do a home visit.

## 2022-04-20 ENCOUNTER — Telehealth (HOSPITAL_COMMUNITY): Payer: Self-pay | Admitting: Emergency Medicine

## 2022-04-20 NOTE — Telephone Encounter (Signed)
Attempted to contact @ 14:28 w/o answer and unable to leave voicemail- voicemail full.

## 2022-04-22 ENCOUNTER — Telehealth (HOSPITAL_COMMUNITY): Payer: Self-pay | Admitting: Emergency Medicine

## 2022-04-22 NOTE — Telephone Encounter (Signed)
Attempted phone call.  No answer, voice mail full.  Needs home visit

## 2022-04-27 ENCOUNTER — Other Ambulatory Visit: Payer: Self-pay

## 2022-04-27 ENCOUNTER — Telehealth (HOSPITAL_COMMUNITY): Payer: Self-pay | Admitting: Licensed Clinical Social Worker

## 2022-04-27 ENCOUNTER — Telehealth (HOSPITAL_COMMUNITY): Payer: Self-pay

## 2022-04-27 NOTE — Telephone Encounter (Signed)
CSW informed that paramedic has been unable to get a hold of pt- CSW attempted to call pt to remind of appt tomorrow and discuss continued enrollment in paramedicine.  Unable to reach so sent text message informing of above  [name], LCSW Clinical Social Worker [facility] Desk#: [phone] Cell#: [phone]

## 2022-04-27 NOTE — Telephone Encounter (Signed)
Pt returned CSW call- confirms he is aware of appt tomorrow and has no barriers to attending.  Is interested in joining paramedicine- paramedic will plan to meet during appt tomorrow  [name], LCSW Clinical Social Worker [facility] Desk#: [phone] Cell#: [phone]

## 2022-04-27 NOTE — Telephone Encounter (Signed)
Called and was unable to leave patient a voice message to confirm/remind patient of their appointment at the [facility] on 04/28/22.    

## 2022-04-28 ENCOUNTER — Other Ambulatory Visit (HOSPITAL_COMMUNITY): Payer: Self-pay | Admitting: Emergency Medicine

## 2022-04-28 ENCOUNTER — Other Ambulatory Visit: Payer: Self-pay | Admitting: Family Medicine

## 2022-04-28 ENCOUNTER — Other Ambulatory Visit: Payer: Self-pay | Admitting: Pharmacist

## 2022-04-28 ENCOUNTER — Encounter (HOSPITAL_COMMUNITY): Payer: Self-pay

## 2022-04-28 ENCOUNTER — Ambulatory Visit (HOSPITAL_COMMUNITY)
Admission: RE | Admit: 2022-04-28 | Discharge: 2022-04-28 | Disposition: A | Discharge status: Home / Self Care | Payer: Medicaid Other | Source: Ambulatory Visit | Attending: Family Medicine | Admitting: Family Medicine

## 2022-04-28 ENCOUNTER — Other Ambulatory Visit: Payer: Self-pay

## 2022-04-28 VITALS — BP 98/60 | HR 61 | Wt 302.0 lb

## 2022-04-28 DIAGNOSIS — Z4502 Encounter for adjustment and management of automatic implantable cardiac defibrillator: Secondary | ICD-10-CM | POA: Diagnosis not present

## 2022-04-28 DIAGNOSIS — I4729 Other ventricular tachycardia: Secondary | ICD-10-CM | POA: Diagnosis not present

## 2022-04-28 DIAGNOSIS — I10 Essential (primary) hypertension: Secondary | ICD-10-CM | POA: Diagnosis not present

## 2022-04-28 DIAGNOSIS — E1122 Type 2 diabetes mellitus with diabetic chronic kidney disease: Secondary | ICD-10-CM

## 2022-04-28 DIAGNOSIS — E785 Hyperlipidemia, unspecified: Secondary | ICD-10-CM | POA: Diagnosis not present

## 2022-04-28 DIAGNOSIS — Z79899 Other long term (current) drug therapy: Secondary | ICD-10-CM | POA: Insufficient documentation

## 2022-04-28 DIAGNOSIS — E669 Obesity, unspecified: Secondary | ICD-10-CM | POA: Diagnosis not present

## 2022-04-28 DIAGNOSIS — F1410 Cocaine abuse, uncomplicated: Secondary | ICD-10-CM | POA: Insufficient documentation

## 2022-04-28 DIAGNOSIS — E119 Type 2 diabetes mellitus without complications: Secondary | ICD-10-CM | POA: Insufficient documentation

## 2022-04-28 DIAGNOSIS — I2510 Atherosclerotic heart disease of native coronary artery without angina pectoris: Secondary | ICD-10-CM | POA: Diagnosis not present

## 2022-04-28 DIAGNOSIS — Z794 Long term (current) use of insulin: Secondary | ICD-10-CM | POA: Diagnosis not present

## 2022-04-28 DIAGNOSIS — I110 Hypertensive heart disease with heart failure: Secondary | ICD-10-CM | POA: Insufficient documentation

## 2022-04-28 DIAGNOSIS — N182 Chronic kidney disease, stage 2 (mild): Secondary | ICD-10-CM

## 2022-04-28 DIAGNOSIS — I5023 Acute on chronic systolic (congestive) heart failure: Secondary | ICD-10-CM | POA: Insufficient documentation

## 2022-04-28 DIAGNOSIS — I472 Ventricular tachycardia, unspecified: Secondary | ICD-10-CM | POA: Diagnosis not present

## 2022-04-28 DIAGNOSIS — Z720 Tobacco use: Secondary | ICD-10-CM

## 2022-04-28 DIAGNOSIS — F1490 Cocaine use, unspecified, uncomplicated: Secondary | ICD-10-CM

## 2022-04-28 DIAGNOSIS — I428 Other cardiomyopathies: Secondary | ICD-10-CM | POA: Insufficient documentation

## 2022-04-28 DIAGNOSIS — Z139 Encounter for screening, unspecified: Secondary | ICD-10-CM

## 2022-04-28 LAB — BASIC METABOLIC PANEL
Anion gap: 9 (ref 5–15)
BUN: 15 mg/dL (ref 6–20)
CO2: 25 mmol/L (ref 22–32)
Calcium: 8.6 mg/dL — ABNORMAL LOW (ref 8.9–10.3)
Chloride: 105 mmol/L (ref 98–111)
Creatinine, Ser: 1.36 mg/dL — ABNORMAL HIGH (ref 0.61–1.24)
GFR, Estimated: >60 mL/min (ref >60)
Glucose, Bld: 178 mg/dL — ABNORMAL HIGH (ref 70–99)
Potassium: 3.8 mmol/L (ref 3.5–5.1)
Sodium: 139 mmol/L (ref 135–145)

## 2022-04-28 LAB — BRAIN NATRIURETIC PEPTIDE: B Natriuretic Peptide: 696.6 pg/mL — ABNORMAL HIGH (ref 0.0–100.0)

## 2022-04-28 MED ORDER — METOLAZONE 2.5 MG PO TABS
2.5000 mg | ORAL_TABLET | Freq: Once | ORAL | 0 refills | Status: DC
  Filled 2022-04-28: qty 1, 1d supply, fill #0

## 2022-04-28 MED ORDER — LANTUS SOLOSTAR 100 UNIT/ML ~~LOC~~ SOPN
36.0000 [IU] | PEN_INJECTOR | Freq: Every day | SUBCUTANEOUS | 1 refills | Status: DC
  Filled 2022-04-28: qty 15, 41d supply, fill #0
  Filled 2022-06-22 – 2022-07-02 (×2): qty 15, 41d supply, fill #1

## 2022-04-28 MED ORDER — CARVEDILOL 3.125 MG PO TABS
3.1250 mg | ORAL_TABLET | Freq: Two times a day (BID) | ORAL | 3 refills | Status: DC
  Filled 2022-04-28: qty 60, 30d supply, fill #0
  Filled 2022-05-17 – 2022-06-14 (×3): qty 60, 30d supply, fill #1
  Filled 2022-07-22: qty 60, 30d supply, fill #2
  Filled 2022-08-24: qty 60, 30d supply, fill #3

## 2022-04-28 MED ORDER — TORSEMIDE 100 MG PO TABS
ORAL_TABLET | ORAL | 6 refills | Status: DC
  Filled 2022-04-28 – 2022-05-17 (×2): qty 45, 30d supply, fill #0
  Filled 2022-06-14: qty 45, 30d supply, fill #1
  Filled 2022-07-22: qty 45, 30d supply, fill #2
  Filled 2022-08-24: qty 45, 30d supply, fill #3
  Filled 2022-09-21: qty 45, 30d supply, fill #4

## 2022-04-28 MED ORDER — POTASSIUM CHLORIDE CRYS ER 20 MEQ PO TBCR
60.0000 meq | EXTENDED_RELEASE_TABLET | Freq: Two times a day (BID) | ORAL | 11 refills | Status: DC
  Filled 2022-04-28 – 2022-05-17 (×2): qty 180, 30d supply, fill #0

## 2022-04-28 MED ORDER — BASAGLAR KWIKPEN 100 UNIT/ML ~~LOC~~ SOPN
36.0000 [IU] | PEN_INJECTOR | Freq: Every day | SUBCUTANEOUS | 1 refills | Status: DC
Start: 2022-04-28 — End: 2022-04-28
  Filled 2022-04-28: qty 15, 41d supply, fill #0

## 2022-04-28 NOTE — Telephone Encounter (Signed)
Requested Prescriptions  Pending Prescriptions Disp Refills  . Insulin Glargine (BASAGLAR KWIKPEN) 100 UNIT/ML 15 mL 1    Sig: Inject 36 Units into the skin daily. must have office visit for refills.     Endocrinology:  Diabetes - Insulins Passed - 04/28/2022  3:51 PM      Passed - HBA1C is between 0 and 7.9 and within 180 days    HbA1c, POC (controlled diabetic range)  Date Value Ref Range Status  06/02/2021 8.3 (A) 0.0 - 7.0 % Final   Hgb A1c MFr Bld  Date Value Ref Range Status  02/28/2022 7.4 (H) 4.8 - 5.6 % Final    Comment:    REPEATED TO VERIFY (NOTE) Pre diabetes:          5.7%-6.4%  Diabetes:              >6.4%  Glycemic control for   <7.0% adults with diabetes          Passed - Valid encounter within last 6 months    Recent Outpatient Visits          4 weeks ago Hospital discharge follow-up   [place], [place] W, NP   7 months ago Diabetes mellitus with stage 2 chronic kidney disease [facility])   [place], [place] W, NP   10 months ago Diabetes mellitus with stage 2 chronic kidney disease [facility])   [place], Annie [place] L, RPH-CPP   11 months ago Diabetes mellitus with stage 2 chronic kidney disease [facility])   [place], Vernia Buff, NP   12 months ago [place], Vernia Buff, NP      Future Appointments            In 2 months Gildardo Pounds, NP [place]

## 2022-04-28 NOTE — Patient Instructions (Addendum)
INCREASE Torsemide to 100 mg in the AM and 50 mg in the PM INCREASE Potassium to 60 meq twice a day DECREASE Carvedilol to 3.125 mg, one tab twice a day START Metolazone 2.5 mg for one dose 04/28/2022 with additional 40 meq of potassium  Labs today We will only contact you if something comes back abnormal or we need to make some changes. Otherwise no news is good news!  Labs needed in 7-10 days  Please wear your compression hose daily, place them on as soon as you get up in the morning and remove before you go to bed at night.   Your physician recommends that you schedule a follow-up appointment in: 3 weeks  in the Advanced Practitioners (PA/NP) Clinic   Do the following things EVERYDAY: Weigh yourself in the morning before breakfast. Write it down and keep it in a log. Take your medicines as prescribed Eat low salt foods--Limit salt (sodium) to 2000 mg per day.  Stay as active as you can everyday Limit all fluids for the day to less than 2 liters  At the [facility], you and your health needs are our priority. As part of our continuing mission to provide you with exceptional heart care, we have created designated Provider Care Teams. These Care Teams include your primary Cardiologist (physician) and Advanced Practice Providers (APPs- Physician Assistants and Nurse Practitioners) who all work together to provide you with the care you need, when you need it.   You may see any of the following providers on your designated Care Team at your next follow up: Dr [name] Dr Carron Curie, NP Robbie Lis, [place] [facility] Elsie, [place] Karle Plumber, PharmD   Please be sure to bring in all your medications bottles to every appointment.   If you have any questions or concerns before your next appointment please send [place] a message through [place] [place] or call our office at [phone].    TO LEAVE A MESSAGE FOR THE NURSE SELECT OPTION 2, PLEASE  LEAVE A MESSAGE INCLUDING: YOUR NAME DATE OF BIRTH CALL BACK NUMBER REASON FOR CALL**this is important as we prioritize the call backs  YOU WILL RECEIVE A CALL BACK THE SAME DAY AS LONG AS YOU CALL BEFORE 4:00 PM

## 2022-04-28 NOTE — Progress Notes (Signed)
[facility]  Patient ID: Douglas [name], male   DOB: 1976-05-21, 46 y.o.   MRN: [id number]  PCP: [name] NP EP: Dr Caryl Comes HF Cardiologist: Dr. Haroldine Laws  HPI: Douglas [name] is a 46 y.o. male with a h/o HTN, HL, obesity, smoker, cocaine abuse,  NICM and chronic systolic heart failure. He is S/P Medtronic ICD due to NICM. Management has been complicated by severe non-compliance.   Underwent cath in 2006 which showed normal cors with EF 20-25%.   EF had recovered in late 2013 but has again fallen to 15% per echo 01/20/13.  He was admitted to [facility] 02/21/13 from clinic for low output symptoms and volume overload.  He required IV lasix and metolazone.  He diuresed 19 pounds with discharge weight of 254 pounds.  He had runs of VT therefore he was started on amiodarone per EP and Lifevest was placed at discharge.  He underwent RHC on 02/22/13.  He has not required metolazone.   He was admitted in 1/16 with VT and ICD shock x 2.   Echo 6/20 -->EF 25-30%   He was COVID + 11/14/20. He quarantined and recovered.   Echo 04/24/21 EF 30-35% RV ok.   Seen in clinic 6/22, euvolemic with stable NYHA II symptoms. 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  Admitted 4/23 with a/c CHF after not taking meds x 1 week. UDS + cocaine. Echo showed EF 25-30%, LV severely dilated, RV moderately down, moderate to severe MR. Underwent R/LHC showing elevated filling pressures, moderately reduced CO, and mild non obs CAD. He was diuresed with IV lasix, down 21 lbs. GDMT titrated and paramedicine arranged. Discharged home, weight 279 lbs.  Follow up 5/23, OptiVol up and torsemide increased, weight 299 lbs.   Today he returns for HF follow up with paramedic, [place]. Overall feeling fine. He does not have SOB with work duties, recently started Restaurant manager, fast food work at [employer]. Has some more LE swelling. Drinking a lot of soda at work. Denies palpitations, CP, dizziness, or  PND/Orthopnea. Appetite ok. No fever or chills. Weight at home 300 pounds. Taking all medications. Last used cocaine 1-2 weeks ago, smokes 1-2 cigs/day.   Cardiac Studies: - Echo (4/23): EF 25-30%, LV severely dilated, RV moderately down, moderate to severe MR  - R/LHC (4/23):    Prox RCA lesion is 30% stenosed.   Dist RCA lesion is 50% stenosed.   Ost Cx to Prox Cx lesion is 20% stenosed.   Prox LAD to Mid LAD lesion is 40% stenosed.   Dist LAD lesion is 20% stenosed.   The left ventricular ejection fraction is 25-35% by visual estimate.   Ao = 97/76 (85) LV = 98/30 RA =  14 RV = 66/17 PA = 66/26 (47) PCW = 26 Fick cardiac output/index = 5.6/2.1 PVR = 4.0 WU SVR = 983 Ao sat = 92% PA sat = 60%, 65%   1. Severe NICM EF 25% 2. Mild non-obstructive CAD 3. Elevated filling pressures with moderately reduced CO 4. Moderate mixed pulmonary HTN  - RHC 02/22/13  RA = 12  RV = 49/6/15  PA = 54/29 (41)  PCW = 28 (v= 40)  Fick cardiac output/index = 4.3/1.7  PVR = 2.6 [place]  SVR = 1580 dynes  FA sat = 98%  PA sat = 53%, 54%  Non-invasive BP = 124/86 (97)  - RHC 05/03/13 RA = 2  RV = 29/2/2  PA =  26/10 (17)  PCW = 3  Fick cardiac output/index = 6.1/2.3  PVR = 2.3  FA sat = 93%  PA sat = 65%, 67%  - ECHO 06/25/13 EF 20-25% - ECHO 05/01/14 EF 35-40%, moderately dilated LV, mild LVH, diffuse hypokinesis - Echo 6/16 EF 25-30%, mild LVH, moderately dilated LV.  - ECHO 09/23/2016 EF 25-30%. - ECHO 08/19/2017 EF 40-45% grade IDD  - CPX 06/2011 VO2 20  SH: lives with his aunt. Rare ETOH, smokes rarely. Frequent cocaine. FH: Mom has CAD.    Review of systems complete and found to be negative unless listed in HPI.   Past Medical History:  Diagnosis Date   Depression, major, single episode 01/14/2017   Diabetes mellitus [facility])    ED (erectile dysfunction)    History of syncope 01/29/2015   Hypertension    ICD (implantable cardioverter-defibrillator) discharge 11/30/2014   On  11/30/14. Asymptomatic.    Nonischemic cardiomyopathy ([place])    a.  echo 4/06: EF 30%, mild to mod MR, mild RAE, inf HK, lat HK , ant HK;    b.  cath 4/06: no CAD, EF 20-25%   NSVT (nonsustained ventricular tachycardia) (HCC)    Obesity    Systolic CHF, chronic (HCC)    EF 11/17 25-30%, s/p ICD    Current Outpatient Medications  Medication Sig Dispense Refill   Accu-Chek Softclix Lancets lancets Check blood sugar twice daily E11.8 100 each 2   acetaminophen (TYLENOL) 500 MG tablet Take 1 tablet (500 mg total) by mouth every 6 (six) hours as needed. (Patient taking differently: Take 500 mg by mouth every 6 (six) hours as needed for moderate pain.) 30 tablet 0   aspirin 81 MG EC tablet Take 1 tablet (81 mg total) by mouth daily. 30 tablet 5   atorvastatin (LIPITOR) 40 MG tablet Take 1 tablet (40 mg total) by mouth daily. 30 tablet 6   Blood Glucose Calibration (ACCU-CHEK GUIDE CONTROL) LIQD 1 each by In Vitro route once as needed for up to 1 dose. 1 each 0   Blood Glucose Monitoring Suppl (ACCU-CHEK GUIDE ME) w/Device KIT Check blood sugar twice daily E11.8 1 kit 0   carvedilol (COREG) 6.25 MG tablet Take 1 tablet (6.25 mg total) by mouth 2 (two) times daily with a meal. 60 tablet 6   dapagliflozin propanediol (FARXIGA) 10 MG TABS tablet Take 1 tablet (10 mg total) by mouth daily before breakfast. 90 tablet 3   glucose blood (ACCU-CHEK GUIDE) test strip Check blood sugar twice daily E11.8 100 each 0   Insulin Glargine (BASAGLAR KWIKPEN) 100 UNIT/ML Inject 36 Units into the skin daily. must have office visit for refills. (Patient taking differently: Inject 36 Units into the skin every morning.) 15 mL 0   Insulin Pen Needle 32G X 4 MM MISC INJECT 1 APPLICATION INTO THE SKIN DAILY. THE PATIENT IS INSULIN REQUIRING, ICD 10 CODE E11.9. THE PATIENT INJECTS 1 TIMES PER DAY. 100 each 3   potassium chloride SA (KLOR-CON M) 20 MEQ tablet TAKE 2 TABLETS (40 MEQ TOTAL) BY MOUTH 2 (TWO) TIMES DAILY. (Patient  taking differently: 40 mEq 2 (two) times daily.) 120 tablet 11   sacubitril-valsartan (ENTRESTO) 49-51 MG TAKE 1 TABLET BY MOUTH 2 (TWO) TIMES DAILY. 60 tablet 11   spironolactone (ALDACTONE) 25 MG tablet Take 1 tablet (25 mg total) by mouth once daily. 90 tablet 0   torsemide (DEMADEX) 100 MG tablet Take 1 tablet (100 mg total) by mouth daily. LAST REFILL NEEDS  FOLLOW UP APPOINTMENT FOR ANYMORE REFILLS 30 tablet 6   No current facility-administered medications for this encounter.   Allergies  Allergen Reactions   Bydureon [Exenatide] Rash   Penicillins Other (See Comments)    Unknown childhood reaction    BP 98/60   Pulse 61   Wt (!) 137 kg (302 lb)   SpO2 96%   BMI 36.76 kg/m   Wt Readings from Last 3 Encounters:  04/28/22 (!) 137 kg (302 lb)  04/28/22 (!) 137 kg (302 lb)  04/01/22 135 kg (297 lb 9.6 oz)   Physical Exam: General:  NAD. No resp difficulty HEENT: Normal Neck: Supple. JVP difficult, thick neck. Carotids 2+ bilat; no bruits. No lymphadenopathy or thryomegaly appreciated. Cor: PMI nondisplaced. Regular rate & rhythm. No rubs, gallops or murmurs. Lungs: Clear Abdomen: Obese,  nontender, nondistended. No hepatosplenomegaly. No bruits or masses. Good bowel sounds. Extremities: No cyanosis, clubbing, rash, 1-2+ BLE edema Neuro: Alert & oriented x 3, cranial nerves grossly intact. Moves all 4 extremities w/o difficulty. Affect pleasant.  ICD interrogation (personally reviewed): OptiVol up, thoracic impedence down, 3 hrs day/activity, no VT.  Assessment/Plan: Acute on Chronic Systolic Heart Failure - NICM by cath in 2006. Medtronic ICD.  - Echo 04/2021 EF 30-35%.  - Echo (4/23): EF 25-30%, LV severely dilated, RV moderately down, moderate to severe MR - RHC/LHC (4/23): with elevated filling pressures, moderately reduced CO, mild nonob CAD, and severe NICM. RA 14 , PCWP 26.  - NYHA II, volume up on exam and by OptiVol, likely 10 lbs up.  - Take metolazone 2.5 mg x 1  today + extra 40 KCL. - Increase torsemide to 100 mg am/50 mg pm and increase KCL to 60 bid. - Decrease carvedilol to 3.125 mg bid to allow more BP room for diuresis. - Continue Entresto 49/51 mg bid. No BP room to increase today. - Continue spiro 25 mg daily. - Continue Farxiga 10 mg daily. - Discussed limiting salt/fluid intake, and weighing daily.  - Continue paramedicine, appreciate their assistance. - BMET, BNP today, repeat BMET in 1 week. - I will ask device RN to send transmission in 1 week to follow fluid. May need weekly metolazone going further.  2. NSVT  - Denies palpitations. ICD interrogation as above. - On beta blocker. - Labs today.    3. DM2 - On insulin. - Hgb A1C 7.4  - Continue SGLT2i   4. Hyperlipidemia - LDL 133 (4/23) - Continue atorvastatin.   5. Cocaine abuse - UDS + cocaine this past admit - Discussed cessation.    6.Tobacco Abuse - Discussed cessation.   7. CAD - LHC (4/23): mild non ob CAD cath. - No chest pain.  - Needs to stop smoking. - Continue asa + statin.   8. SDOH - Continue HF Paramedicine.  - He has Medicaid.  Follow up with APP in 2-3 weeks to check fluid and Dr. Haroldine Laws in 3-4 months.  Allena Katz, FNP-BC 04/28/22

## 2022-04-28 NOTE — Progress Notes (Signed)
Paramedicine Encounter   Patient ID: Douglas [name] , male,   DOB: 1976/02/24,45 y.o.,  MRN: [id number]    Met patient in clinic today with provider.  Time spent with patient 45 minutes  Have not been able to have a home visit since 5/18.  Met Douglas [name] in HF clinic today w/ Allena Katz.  Pt reports to be doing fairly well.  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  He reports to be somewhat compliant with his meds.  Lung sounds were reported to be clear.  He does have edema to his lower extremities.  His Medtronic device indicates his fluid is elevated.  [name] made med changes in Metolazone, Potassium, Torsemide and Carvedilol.  Pt. Is to follow-up for bloodwork next week due to med changes.  Pt reports he is still using Cocaine on occasion and has used recently, but not specific as to when.    He reports that his job has helped him not use drugs by keeping him busy.  Also discussed limiting his fluid intake to 64 oz or less.  Med changes called in to [facility] and pt to p/u this afternoon.  As pt has been very hard to reach, I asked him to call me the first of next week to set a time for a home visit on 6/22.  Renee Ramus, [place] 04/28/2022   297.9 last visit 5/18

## 2022-05-04 ENCOUNTER — Telehealth: Payer: Self-pay

## 2022-05-04 NOTE — Telephone Encounter (Signed)
Attempted call to patient to assist with sending remote transmission to recheck fluid levels for Douglas Rome, NP at [facility] clinic.  No answer.  Message sent to [name], [place] to inform patient at 6/26 scheduled OV that monitor has been disconnected since 01/08/2022 and last remote received was 07/2021.

## 2022-05-04 NOTE — Telephone Encounter (Signed)
-----   Message from Jacklynn Ganong, [place] sent at 04/28/2022  3:31 PM EDT ----- Regarding: Douglas [name], Can you send a transmission in 1 week please? Thanks! -J

## 2022-05-05 ENCOUNTER — Telehealth (HOSPITAL_COMMUNITY): Payer: Self-pay | Admitting: Licensed Clinical Social Worker

## 2022-05-05 NOTE — Telephone Encounter (Addendum)
HF Paramedicine Team Based Care Meeting  HF MD- NA  HF NP - Amy Clegg NP-C   [facility] HF Paramedicine  Douglas [name]  [facility] admit within the last 30 days for heart failure? no  Medications concerns? Unable to assess  SDOH - homeless- staying with family  Eligible for discharge? Don't have a clear picture of pt needs due to only having seen twice- need to try to assess for further needs- plan to see tomorrow.  [name], LCSW Clinical Social Worker [facility] Desk#: [phone] Cell#: [phone]

## 2022-05-06 ENCOUNTER — Other Ambulatory Visit (HOSPITAL_COMMUNITY): Payer: Medicaid Other

## 2022-05-07 NOTE — Progress Notes (Deleted)
Cardiology Office Note Date:  05/07/2022  Patient ID:  Douglas [name], Douglas [name] 1975-12-22, MRN [id number] PCP:  Claiborne Rigg, NP  Cardiologist:  Dr. [name] Electrophysiologist: Dr. Graciela Husbands  ***refresh   Chief Complaint: *** lost to f/u  History of Present Illness: Douglas [name] is a 46 y.o. male with history of  HTN, HL, obesity, smoker, cocaine abuse,  NICM and chronic systolic heart failure, ICD, NSVT, cocaine abuse, tobacco abuse.  The last note I see with Dr Graciela Husbands is back in 2016  He has followed with HF team since. Most recently June 2023, he was working, denied SOB with work activities, dietary indiscretions, particularly sodas. Denies cocaine for a couple weeks, still smoking Meds were adjusted.  *** symptoms *** meds, CM, CHF team *** volume *** + remotes   Device information MDT single chamber ICD implanted 08/23/2013   Past Medical History:  Diagnosis Date   Depression, major, single episode 01/14/2017   Diabetes mellitus (HCC)    ED (erectile dysfunction)    History of syncope 01/29/2015   Hypertension    ICD (implantable cardioverter-defibrillator) discharge 11/30/2014   On 11/30/14. Asymptomatic.    Nonischemic cardiomyopathy (HCC)    a.  echo 4/06: EF 30%, mild to mod MR, mild RAE, inf HK, lat HK , ant HK;    b.  cath 4/06: no CAD, EF 20-25%   NSVT (nonsustained ventricular tachycardia) (HCC)    Obesity    Systolic CHF, chronic (HCC)    EF 11/17 25-30%, s/p ICD    Past Surgical History:  Procedure Laterality Date   CARDIAC CATHETERIZATION  09/2011; 02/2013; 04/2013   CARDIAC DEFIBRILLATOR PLACEMENT  08/23/2013   IMPLANTABLE CARDIOVERTER DEFIBRILLATOR IMPLANT N/A 08/23/2013   Procedure: IMPLANTABLE CARDIOVERTER DEFIBRILLATOR IMPLANT;  Surgeon: Duke Salvia, MD;  Location: [facility] CATH LAB;  Service: Cardiovascular;  Laterality: N/A;   LEFT AND RIGHT HEART CATHETERIZATION WITH CORONARY ANGIOGRAM N/A 09/20/2011   Procedure: LEFT AND RIGHT HEART  CATHETERIZATION WITH CORONARY ANGIOGRAM;  Surgeon: Dolores Patty, MD;  Location: [facility] CATH LAB;  Service: Cardiovascular;  Laterality: N/A;   MULTIPLE EXTRACTIONS WITH ALVEOLOPLASTY N/A 01/26/2013   Procedure:  EXTRACION  TOOTH # 19 WITH ALVEOLOPLASTY;  Surgeon: Charlynne Pander, DDS;  Location: [facility] OR;  Service: Oral Surgery;  Laterality: N/A;   RIGHT HEART CATHETERIZATION N/A 02/22/2013   Procedure: RIGHT HEART CATH;  Surgeon: Dolores Patty, MD;  Location: [facility] CATH LAB;  Service: Cardiovascular;  Laterality: N/A;   RIGHT HEART CATHETERIZATION N/A 05/03/2013   Procedure: RIGHT HEART CATH;  Surgeon: Dolores Patty, MD;  Location: [facility] CATH LAB;  Service: Cardiovascular;  Laterality: N/A;   RIGHT/LEFT HEART CATH AND CORONARY ANGIOGRAPHY N/A 03/03/2022   Procedure: RIGHT/LEFT HEART CATH AND CORONARY ANGIOGRAPHY;  Surgeon: Dolores Patty, MD;  Location: [facility] INVASIVE CV LAB;  Service: Cardiovascular;  Laterality: N/A;    Current Outpatient Medications  Medication Sig Dispense Refill   Accu-Chek Softclix Lancets lancets Check blood sugar twice daily E11.8 100 each 2   acetaminophen (TYLENOL) 500 MG tablet Take 1 tablet (500 mg total) by mouth every 6 (six) hours as needed. (Patient taking differently: Take 500 mg by mouth every 6 (six) hours as needed for moderate pain.) 30 tablet 0   aspirin 81 MG EC tablet Take 1 tablet (81 mg total) by mouth daily. 30 tablet 5   atorvastatin (LIPITOR) 40 MG tablet Take 1 tablet (40 mg total) by mouth daily. 30 tablet 6  Blood Glucose Calibration (ACCU-CHEK GUIDE CONTROL) LIQD 1 each by In Vitro route once as needed for up to 1 dose. 1 each 0   Blood Glucose Monitoring Suppl (ACCU-CHEK GUIDE ME) w/Device KIT Check blood sugar twice daily E11.8 1 kit 0   carvedilol (COREG) 3.125 MG tablet Take 1 tablet (3.125 mg total) by mouth 2 (two) times daily with a meal. 60 tablet 3   dapagliflozin propanediol (FARXIGA) 10 MG TABS tablet Take 1 tablet (10 mg total) by  mouth daily before breakfast. 90 tablet 3   glucose blood (ACCU-CHEK GUIDE) test strip Check blood sugar twice daily E11.8 100 each 0   insulin glargine (LANTUS SOLOSTAR) 100 UNIT/ML Solostar Pen Inject 36 Units into the skin once daily. 15 mL 1   Insulin Pen Needle 32G X 4 MM MISC INJECT 1 APPLICATION INTO THE SKIN DAILY. THE PATIENT IS INSULIN REQUIRING, ICD 10 CODE E11.9. THE PATIENT INJECTS 1 TIMES PER DAY. 100 each 3   metolazone (ZAROXOLYN) 2.5 MG tablet Take 1 tablet (2.5 mg total) by mouth once for 1 dose. With ADDITIONAL 40 meq of Potaasium 1 tablet 0   potassium chloride SA (KLOR-CON M) 20 MEQ tablet Take 3 tablets (60 mEq total) by mouth 2 (two) times daily. 180 tablet 11   sacubitril-valsartan (ENTRESTO) 49-51 MG TAKE 1 TABLET BY MOUTH 2 (TWO) TIMES DAILY. 60 tablet 11   spironolactone (ALDACTONE) 25 MG tablet Take 1 tablet (25 mg total) by mouth once daily. 90 tablet 0   torsemide (DEMADEX) 100 MG tablet Take 1 tablet (100 mg total) by mouth daily. LAST REFILL NEEDS FOLLOW UP APPOINTMENT FOR ANYMORE REFILLS 30 tablet 6   torsemide (DEMADEX) 100 MG tablet Take 1 tablet (100 mg total) by mouth in the morning AND 0.5 tablets (50 mg total) every evening. 45 tablet 6   No current facility-administered medications for this visit.    Allergies:   Bydureon [exenatide] and Penicillins   Social History:  The patient  reports that he has been smoking cigarettes. He has a 2.00 pack-year smoking history. He has never used smokeless tobacco. He reports current alcohol use. He reports that he does not currently use drugs after having used the following drugs: Cocaine.   Family History:  The patient's family history includes Coronary artery disease in his maternal uncle; Coronary artery disease (age of onset: 56) in his mother; Diabetes type II in his maternal uncle; Lung cancer in his father.  ROS:  Please see the history of present illness.    All other systems are reviewed and otherwise  negative.   PHYSICAL EXAM:  VS:  There were no vitals taken for this visit. BMI: There is no height or weight on file to calculate BMI. Well nourished, well developed, in no acute distress HEENT: normocephalic, atraumatic Neck: no JVD, carotid bruits or masses Cardiac:  *** RRR; no significant murmurs, no rubs, or gallops Lungs:  *** CTA b/l, no wheezing, rhonchi or rales Abd: soft, nontender MS: no deformity or *** atrophy Ext: *** no edema Skin: warm and dry, no rash Neuro:  No gross deficits appreciated Psych: euthymic mood, full affect  *** ICD site is stable, no tethering or discomfort   EKG:  not done today  Device interrogation done today and reviewed by myself:  ***  - Echo (4/23): EF 25-30%, LV severely dilated, RV moderately down, moderate to severe MR   - R/LHC (4/23):    Prox RCA lesion is 30% stenosed.  Dist RCA lesion is 50% stenosed.   Ost Cx to Prox Cx lesion is 20% stenosed.   Prox LAD to Mid LAD lesion is 40% stenosed.   Dist LAD lesion is 20% stenosed.   The left ventricular ejection fraction is 25-35% by visual estimate.   Ao = 97/76 (85) LV = 98/30 RA =  14 RV = 66/17 PA = 66/26 (47) PCW = 26 Fick cardiac output/index = 5.6/2.1 PVR = 4.0 WU SVR = 983 Ao sat = 92% PA sat = 60%, 65%   1. Severe NICM EF 25% 2. Mild non-obstructive CAD 3. Elevated filling pressures with moderately reduced CO 4. Moderate mixed pulmonary HTN   - RHC 02/22/13  RA = 12  RV = 49/6/15  PA = 54/29 (41)  PCW = 28 (v= 40)  Fick cardiac output/index = 4.3/1.7  PVR = 2.6 Woods  SVR = 1580 dynes  FA sat = 98%  PA sat = 53%, 54%  Non-invasive BP = 124/86 (97)   - RHC 05/03/13 RA = 2  RV = 29/2/2  PA = 26/10 (17)  PCW = 3  Fick cardiac output/index = 6.1/2.3  PVR = 2.3  FA sat = 93%  PA sat = 65%, 67%   - ECHO 06/25/13 EF 20-25% - ECHO 05/01/14 EF 35-40%, moderately dilated LV, mild LVH, diffuse hypokinesis - Echo 6/16 EF 25-30%, mild LVH, moderately dilated  LV.  - ECHO 09/23/2016 EF 25-30%. - ECHO 08/19/2017 EF 40-45% grade IDD   - CPX 06/2011 VO2 20   Recent Labs: 09/02/2021: ALT 15 03/03/2022: Hemoglobin 14.4; Platelets 235 03/05/2022: Magnesium 2.4 04/28/2022: B Natriuretic Peptide 696.6; BUN 15; Creatinine, Ser 1.36; Potassium 3.8; Sodium 139  03/01/2022: Cholesterol 191; HDL 41; LDL Cholesterol 133; Total CHOL/HDL Ratio 4.7; Triglycerides 86; VLDL 17   Estimated Creatinine Clearance: 101.5 mL/min (A) (by C-G formula based on SCr of 1.36 mg/dL (H)).   Wt Readings from Last 3 Encounters:  04/28/22 (!) 302 lb (137 kg)  04/28/22 (!) 302 lb (137 kg)  04/01/22 297 lb 9.6 oz (135 kg)     Other studies reviewed: Additional studies/records reviewed today include: summarized above  ASSESSMENT AND PLAN:  ICD ***  NICM Chronic CHF ***  NSVT ***  Disposition: F/u with ***  Current medicines are reviewed at length with the patient today.  The patient did not have any concerns regarding medicines.  Venetia Night, PA-C 05/07/2022 1:50 PM     [place] [address] [place] [address] [phone] (office)  [phone] (fax)

## 2022-05-10 ENCOUNTER — Encounter: Payer: Medicaid Other | Admitting: Physician Assistant

## 2022-05-14 ENCOUNTER — Other Ambulatory Visit: Payer: Self-pay | Admitting: Internal Medicine

## 2022-05-17 ENCOUNTER — Other Ambulatory Visit (HOSPITAL_COMMUNITY): Payer: Self-pay | Admitting: Family Medicine

## 2022-05-19 ENCOUNTER — Other Ambulatory Visit: Payer: Self-pay

## 2022-05-20 ENCOUNTER — Other Ambulatory Visit: Payer: Self-pay

## 2022-05-21 ENCOUNTER — Other Ambulatory Visit: Payer: Self-pay

## 2022-05-24 ENCOUNTER — Other Ambulatory Visit: Payer: Self-pay

## 2022-05-25 ENCOUNTER — Other Ambulatory Visit: Payer: Self-pay

## 2022-05-25 ENCOUNTER — Telehealth (HOSPITAL_COMMUNITY): Payer: Self-pay | Admitting: Emergency Medicine

## 2022-05-25 NOTE — Telephone Encounter (Signed)
Attempted call to schedule appt.  Voice mail full could not leave message.  Did text him regarding home visit.

## 2022-05-26 ENCOUNTER — Other Ambulatory Visit: Payer: Self-pay

## 2022-06-01 ENCOUNTER — Telehealth (HOSPITAL_COMMUNITY): Payer: Self-pay

## 2022-06-01 NOTE — Progress Notes (Signed)
[facility]  Patient ID: Douglas [name], male   DOB: 1976-05-21, 46 y.o.   MRN: [id number]  PCP: [name] NP EP: Dr Caryl Comes HF Cardiologist: Dr. Haroldine Laws  HPI: Douglas [name] is a 46 y.o. male with a h/o HTN, HL, obesity, smoker, cocaine abuse,  NICM and chronic systolic heart failure. He is S/P Medtronic ICD due to NICM. Management has been complicated by severe non-compliance.   Underwent cath in 2006 which showed normal cors with EF 20-25%.   EF had recovered in late 2013 but has again fallen to 15% per echo 01/20/13.  He was admitted to [facility] 02/21/13 from clinic for low output symptoms and volume overload.  He required IV lasix and metolazone.  He diuresed 19 pounds with discharge weight of 254 pounds.  He had runs of VT therefore he was started on amiodarone per EP and Lifevest was placed at discharge.  He underwent RHC on 02/22/13.  He has not required metolazone.   He was admitted in 1/16 with VT and ICD shock x 2.   Echo 6/20 -->EF 25-30%   He was COVID + 11/14/20. He quarantined and recovered.   Echo 04/24/21 EF 30-35% RV ok.   Seen in clinic 6/22, euvolemic with stable NYHA II symptoms. 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  Admitted 4/23 with a/c CHF after not taking meds x 1 week. UDS + cocaine. Echo showed EF 25-30%, LV severely dilated, RV moderately down, moderate to severe MR. Underwent R/LHC showing elevated filling pressures, moderately reduced CO, and mild non obs CAD. He was diuresed with IV lasix, down 21 lbs. GDMT titrated and paramedicine arranged. Discharged home, weight 279 lbs.  Follow up 5/23, OptiVol up and torsemide increased, weight 299 lbs.   Today he returns for HF follow up with paramedic, [place]. Overall feeling fine. He does not have SOB with work duties, recently started Restaurant manager, fast food work at [employer]. Has some more LE swelling. Drinking a lot of soda at work. Denies palpitations, CP, dizziness, or  PND/Orthopnea. Appetite ok. No fever or chills. Weight at home 300 pounds. Taking all medications. Last used cocaine 1-2 weeks ago, smokes 1-2 cigs/day.   Cardiac Studies: - Echo (4/23): EF 25-30%, LV severely dilated, RV moderately down, moderate to severe MR  - R/LHC (4/23):    Prox RCA lesion is 30% stenosed.   Dist RCA lesion is 50% stenosed.   Ost Cx to Prox Cx lesion is 20% stenosed.   Prox LAD to Mid LAD lesion is 40% stenosed.   Dist LAD lesion is 20% stenosed.   The left ventricular ejection fraction is 25-35% by visual estimate.   Ao = 97/76 (85) LV = 98/30 RA =  14 RV = 66/17 PA = 66/26 (47) PCW = 26 Fick cardiac output/index = 5.6/2.1 PVR = 4.0 WU SVR = 983 Ao sat = 92% PA sat = 60%, 65%   1. Severe NICM EF 25% 2. Mild non-obstructive CAD 3. Elevated filling pressures with moderately reduced CO 4. Moderate mixed pulmonary HTN  - RHC 02/22/13  RA = 12  RV = 49/6/15  PA = 54/29 (41)  PCW = 28 (v= 40)  Fick cardiac output/index = 4.3/1.7  PVR = 2.6 Woods  SVR = 1580 dynes  FA sat = 98%  PA sat = 53%, 54%  Non-invasive BP = 124/86 (97)  - RHC 05/03/13 RA = 2  RV = 29/2/2  PA =  26/10 (17)  PCW = 3  Fick cardiac output/index = 6.1/2.3  PVR = 2.3  FA sat = 93%  PA sat = 65%, 67%  - ECHO 06/25/13 EF 20-25% - ECHO 05/01/14 EF 35-40%, moderately dilated LV, mild LVH, diffuse hypokinesis - Echo 6/16 EF 25-30%, mild LVH, moderately dilated LV.  - ECHO 09/23/2016 EF 25-30%. - ECHO 08/19/2017 EF 40-45% grade IDD  - CPX 06/2011 VO2 20  SH: lives with his aunt. Rare ETOH, smokes rarely. Frequent cocaine. FH: Mom has CAD.    Review of systems complete and found to be negative unless listed in HPI.   Past Medical History:  Diagnosis Date   Depression, major, single episode 01/14/2017   Diabetes mellitus [facility])    ED (erectile dysfunction)    History of syncope 01/29/2015   Hypertension    ICD (implantable cardioverter-defibrillator) discharge 11/30/2014   On  11/30/14. Asymptomatic.    Nonischemic cardiomyopathy ([place])    a.  echo 4/06: EF 30%, mild to mod MR, mild RAE, inf HK, lat HK , ant HK;    b.  cath 4/06: no CAD, EF 20-25%   NSVT (nonsustained ventricular tachycardia) (HCC)    Obesity    Systolic CHF, chronic (HCC)    EF 11/17 25-30%, s/p ICD    Current Outpatient Medications  Medication Sig Dispense Refill   Accu-Chek Softclix Lancets lancets Check blood sugar twice daily E11.8 100 each 2   acetaminophen (TYLENOL) 500 MG tablet Take 1 tablet (500 mg total) by mouth every 6 (six) hours as needed. (Patient taking differently: Take 500 mg by mouth every 6 (six) hours as needed for moderate pain.) 30 tablet 0   aspirin 81 MG EC tablet Take 1 tablet (81 mg total) by mouth daily. 30 tablet 5   atorvastatin (LIPITOR) 40 MG tablet Take 1 tablet (40 mg total) by mouth daily. 30 tablet 6   Blood Glucose Calibration (ACCU-CHEK GUIDE CONTROL) LIQD 1 each by In Vitro route once as needed for up to 1 dose. 1 each 0   Blood Glucose Monitoring Suppl (ACCU-CHEK GUIDE ME) w/Device KIT Check blood sugar twice daily E11.8 1 kit 0   carvedilol (COREG) 3.125 MG tablet Take 1 tablet (3.125 mg total) by mouth 2 (two) times daily with a meal. 60 tablet 3   dapagliflozin propanediol (FARXIGA) 10 MG TABS tablet Take 1 tablet (10 mg total) by mouth daily before breakfast. 90 tablet 3   glucose blood (ACCU-CHEK GUIDE) test strip Check blood sugar twice daily E11.8 100 each 0   insulin glargine (LANTUS SOLOSTAR) 100 UNIT/ML Solostar Pen Inject 36 Units into the skin once daily. 15 mL 1   Insulin Pen Needle 32G X 4 MM MISC INJECT 1 APPLICATION INTO THE SKIN DAILY. THE PATIENT IS INSULIN REQUIRING, ICD 10 CODE E11.9. THE PATIENT INJECTS 1 TIMES PER DAY. 100 each 3   metolazone (ZAROXOLYN) 2.5 MG tablet Take 1 tablet (2.5 mg total) by mouth once for 1 dose. With ADDITIONAL 40 meq of Potaasium 1 tablet 0   potassium chloride SA (KLOR-CON M) 20 MEQ tablet Take 3 tablets (60  mEq total) by mouth 2 (two) times daily. 180 tablet 11   sacubitril-valsartan (ENTRESTO) 49-51 MG TAKE 1 TABLET BY MOUTH 2 (TWO) TIMES DAILY. 60 tablet 11   spironolactone (ALDACTONE) 25 MG tablet Take 1 tablet (25 mg total) by mouth once daily. 90 tablet 0   torsemide (DEMADEX) 100 MG tablet Take 1 tablet (100 mg total) by  mouth daily. LAST REFILL NEEDS FOLLOW UP APPOINTMENT FOR ANYMORE REFILLS 30 tablet 6   torsemide (DEMADEX) 100 MG tablet Take 1 tablet (100 mg total) by mouth in the morning AND 0.5 tablets (50 mg total) every evening. 45 tablet 6   No current facility-administered medications for this visit.   Allergies  Allergen Reactions   Bydureon [Exenatide] Rash   Penicillins Other (See Comments)    Unknown childhood reaction    There were no vitals taken for this visit.  Wt Readings from Last 3 Encounters:  04/28/22 (!) 137 kg (302 lb)  04/28/22 (!) 137 kg (302 lb)  04/01/22 135 kg (297 lb 9.6 oz)   Physical Exam: General:  NAD. No resp difficulty HEENT: Normal Neck: Supple. JVP difficult, thick neck. Carotids 2+ bilat; no bruits. No lymphadenopathy or thryomegaly appreciated. Cor: PMI nondisplaced. Regular rate & rhythm. No rubs, gallops or murmurs. Lungs: Clear Abdomen: Obese,  nontender, nondistended. No hepatosplenomegaly. No bruits or masses. Good bowel sounds. Extremities: No cyanosis, clubbing, rash, 1-2+ BLE edema Neuro: Alert & oriented x 3, cranial nerves grossly intact. Moves all 4 extremities w/o difficulty. Affect pleasant.  ICD interrogation (personally reviewed): OptiVol up, thoracic impedence down, 3 hrs day/activity, no VT.  Assessment/Plan: Acute on Chronic Systolic Heart Failure - NICM by cath in 2006. Medtronic ICD.  - Echo 04/2021 EF 30-35%.  - Echo (4/23): EF 25-30%, LV severely dilated, RV moderately down, moderate to severe MR - RHC/LHC (4/23): with elevated filling pressures, moderately reduced CO, mild nonob CAD, and severe NICM. RA 14 , PCWP  26.  - NYHA II, volume up on exam and by OptiVol, likely 10 lbs up.  - Take metolazone 2.5 mg x 1 today + extra 40 KCL. - Increase torsemide to 100 mg am/50 mg pm and increase KCL to 60 bid. - Decrease carvedilol to 3.125 mg bid to allow more BP room for diuresis. - Continue Entresto 49/51 mg bid. No BP room to increase today. - Continue spiro 25 mg daily. - Continue Farxiga 10 mg daily. - Discussed limiting salt/fluid intake, and weighing daily.  - Continue paramedicine, appreciate their assistance. - BMET, BNP today, repeat BMET in 1 week. - I will ask device RN to send transmission in 1 week to follow fluid. May need weekly metolazone going further.  2. NSVT  - Denies palpitations. ICD interrogation as above. - On beta blocker. - Labs today.    3. DM2 - On insulin. - Hgb A1C 7.4  - Continue SGLT2i   4. Hyperlipidemia - LDL 133 (4/23) - Continue atorvastatin.   5. Cocaine abuse - UDS + cocaine this past admit - Discussed cessation.    6.Tobacco Abuse - Discussed cessation.   7. CAD - LHC (4/23): mild non ob CAD cath. - No chest pain.  - Needs to stop smoking. - Continue asa + statin.   8. SDOH - Continue HF Paramedicine.  - He has Medicaid.  Follow up with APP in 2-3 weeks to check fluid and Dr. Haroldine Laws in 3-4 months.  Allena Katz, FNP-BC 06/01/22

## 2022-06-01 NOTE — Telephone Encounter (Signed)
Called to confirm/remind patient of their appointment at the [facility] on 06/02/22.   Patient reminded to bring all medications and/or complete list.  Confirmed patient has transportation. Gave directions, instructed to utilize valet parking.  Confirmed appointment prior to ending call.

## 2022-06-02 ENCOUNTER — Encounter (HOSPITAL_COMMUNITY): Payer: Self-pay

## 2022-06-02 ENCOUNTER — Ambulatory Visit (HOSPITAL_COMMUNITY)
Admission: RE | Admit: 2022-06-02 | Discharge: 2022-06-02 | Disposition: A | Discharge status: Home / Self Care | Payer: Medicaid Other | Source: Ambulatory Visit | Attending: Family Medicine | Admitting: Family Medicine

## 2022-06-02 ENCOUNTER — Other Ambulatory Visit: Payer: Self-pay

## 2022-06-02 ENCOUNTER — Other Ambulatory Visit (HOSPITAL_COMMUNITY): Payer: Self-pay | Admitting: Family Medicine

## 2022-06-02 VITALS — BP 102/78 | HR 62 | Wt 295.4 lb

## 2022-06-02 DIAGNOSIS — I110 Hypertensive heart disease with heart failure: Secondary | ICD-10-CM | POA: Diagnosis present

## 2022-06-02 DIAGNOSIS — Z79899 Other long term (current) drug therapy: Secondary | ICD-10-CM | POA: Diagnosis not present

## 2022-06-02 DIAGNOSIS — I2510 Atherosclerotic heart disease of native coronary artery without angina pectoris: Secondary | ICD-10-CM | POA: Diagnosis not present

## 2022-06-02 DIAGNOSIS — F1410 Cocaine abuse, uncomplicated: Secondary | ICD-10-CM | POA: Diagnosis not present

## 2022-06-02 DIAGNOSIS — I4729 Other ventricular tachycardia: Secondary | ICD-10-CM | POA: Diagnosis not present

## 2022-06-02 DIAGNOSIS — F17200 Nicotine dependence, unspecified, uncomplicated: Secondary | ICD-10-CM | POA: Diagnosis not present

## 2022-06-02 DIAGNOSIS — Z720 Tobacco use: Secondary | ICD-10-CM

## 2022-06-02 DIAGNOSIS — Z794 Long term (current) use of insulin: Secondary | ICD-10-CM | POA: Diagnosis not present

## 2022-06-02 DIAGNOSIS — E119 Type 2 diabetes mellitus without complications: Secondary | ICD-10-CM | POA: Insufficient documentation

## 2022-06-02 DIAGNOSIS — E669 Obesity, unspecified: Secondary | ICD-10-CM | POA: Insufficient documentation

## 2022-06-02 DIAGNOSIS — I5022 Chronic systolic (congestive) heart failure: Secondary | ICD-10-CM | POA: Insufficient documentation

## 2022-06-02 DIAGNOSIS — I428 Other cardiomyopathies: Secondary | ICD-10-CM | POA: Insufficient documentation

## 2022-06-02 DIAGNOSIS — Z8616 Personal history of COVID-19: Secondary | ICD-10-CM | POA: Insufficient documentation

## 2022-06-02 DIAGNOSIS — Z4502 Encounter for adjustment and management of automatic implantable cardiac defibrillator: Secondary | ICD-10-CM | POA: Diagnosis not present

## 2022-06-02 DIAGNOSIS — I5023 Acute on chronic systolic (congestive) heart failure: Secondary | ICD-10-CM

## 2022-06-02 DIAGNOSIS — E785 Hyperlipidemia, unspecified: Secondary | ICD-10-CM | POA: Diagnosis not present

## 2022-06-02 DIAGNOSIS — I472 Ventricular tachycardia, unspecified: Secondary | ICD-10-CM | POA: Diagnosis not present

## 2022-06-02 DIAGNOSIS — Z139 Encounter for screening, unspecified: Secondary | ICD-10-CM

## 2022-06-02 DIAGNOSIS — F1490 Cocaine use, unspecified, uncomplicated: Secondary | ICD-10-CM

## 2022-06-02 DIAGNOSIS — I10 Essential (primary) hypertension: Secondary | ICD-10-CM

## 2022-06-02 LAB — BASIC METABOLIC PANEL WITH GFR
Anion gap: 8 (ref 5–15)
BUN: 15 mg/dL (ref 6–20)
CO2: 27 mmol/L (ref 22–32)
Calcium: 8.2 mg/dL — ABNORMAL LOW (ref 8.9–10.3)
Chloride: 105 mmol/L (ref 98–111)
Creatinine, Ser: 1.21 mg/dL (ref 0.61–1.24)
GFR, Estimated: >60 mL/min
Glucose, Bld: 117 mg/dL — ABNORMAL HIGH (ref 70–99)
Potassium: 3.3 mmol/L — ABNORMAL LOW (ref 3.5–5.1)
Sodium: 140 mmol/L (ref 135–145)

## 2022-06-02 LAB — BRAIN NATRIURETIC PEPTIDE: B Natriuretic Peptide: 649.2 pg/mL — ABNORMAL HIGH (ref 0.0–100.0)

## 2022-06-02 MED ORDER — METOLAZONE 2.5 MG PO TABS
2.5000 mg | ORAL_TABLET | ORAL | 1 refills | Status: DC
  Filled 2022-06-02: qty 6, 84d supply, fill #0
  Filled 2022-09-21: qty 6, 84d supply, fill #1

## 2022-06-02 MED ORDER — DAPAGLIFLOZIN PROPANEDIOL 10 MG PO TABS
10.0000 mg | ORAL_TABLET | Freq: Every day | ORAL | 3 refills | Status: DC
  Filled 2022-06-02 – 2022-07-02 (×3): qty 90, 90d supply, fill #0
  Filled 2022-09-21: qty 90, 90d supply, fill #1

## 2022-06-02 NOTE — Patient Instructions (Addendum)
Thank you for coming in today  Labs were done today, if any labs are abnormal the clinic will call you No news is good news  RESTART Farxiga 10 mg 1 tablet daily  TAKE Metolazone 2.5 mg with 40 meq of Potassium every other Wednesday  Your physician recommends that you return for lab work in:  10-14 days for BMET  Your physician recommends that you schedule a follow-up appointment in:  Keep follow up appointment with Dr. [name]    Do the following things EVERYDAY: Weigh yourself in the morning before breakfast. Write it down and keep it in a log. Take your medicines as prescribed Eat low salt foods--Limit salt (sodium) to 2000 mg per day.  Stay as active as you can everyday Limit all fluids for the day to less than 2 liters   At the [facility], you and your health needs are our priority. As part of our continuing mission to provide you with exceptional heart care, we have created designated Provider Care Teams. These Care Teams include your primary Cardiologist (physician) and Advanced Practice Providers (APPs- Physician Assistants and Nurse Practitioners) who all work together to provide you with the care you need, when you need it.   You may see any of the following providers on your designated Care Team at your next follow up: Dr [name] Dr Carron Curie, NP Robbie Lis, [place] [facility] [place], [place] Karle Plumber, PharmD   Please be sure to bring in all your medications bottles to every appointment.   If you have any questions or concerns before your next appointment please send [place] a message through [place] or call our office at [phone].    TO LEAVE A MESSAGE FOR THE NURSE SELECT OPTION 2, PLEASE LEAVE A MESSAGE INCLUDING: YOUR NAME DATE OF BIRTH CALL BACK NUMBER REASON FOR CALL**this is important as we prioritize the call backs  YOU WILL RECEIVE A CALL BACK THE SAME DAY AS LONG AS YOU CALL BEFORE 4:00 PM

## 2022-06-02 NOTE — Progress Notes (Signed)
H&V Care Navigation CSW Progress Note  Clinical Social Worker met with patient to discuss housing concerns.  Patient is participating in a Managed Medicaid Plan:  Yes  SDOH Screenings   Alcohol Screen: Not on file  Depression (PHQ2-9): Medium Risk (03/31/2022)   Depression (PHQ2-9)    PHQ-2 Score: 5  Financial Resource Strain: High Risk (03/01/2022)   Overall Financial Resource Strain (CARDIA)    Difficulty of Paying Living Expenses: Hard  Food Insecurity: No Food Insecurity (03/01/2022)   Hunger Vital Sign    Worried About Running Out of Food in the Last Year: Never true    Ran Out of Food in the Last Year: Never true  Housing: High Risk (03/01/2022)   Housing    Last Housing Risk Score: 4  Physical Activity: Not on file  Social Connections: Not on file  Stress: Not on file  Tobacco Use: High Risk (06/02/2022)   Patient History    Smoking Tobacco Use: Every Day    Smokeless Tobacco Use: Never    Passive Exposure: Not on file  Transportation Needs: No Transportation Needs (03/01/2022)   PRAPARE - Transportation    Lack of Transportation (Medical): No    Lack of Transportation (Non-Medical): No   Pt has everything he needs right now- is sleeping on the floor at his aunts house but states no room for airbed or anything.  Not able to see paramedic due to lack of communication and busy schedule so [place] at this time.  Jorge Ny, LCSW Clinical Social Worker [facility] Desk#: [phone] Cell#: [phone]

## 2022-06-08 ENCOUNTER — Other Ambulatory Visit: Payer: Self-pay

## 2022-06-11 ENCOUNTER — Telehealth (HOSPITAL_COMMUNITY): Payer: Self-pay | Admitting: Emergency Medicine

## 2022-06-11 ENCOUNTER — Other Ambulatory Visit (HOSPITAL_COMMUNITY): Payer: Self-pay | Admitting: Emergency Medicine

## 2022-06-11 NOTE — Telephone Encounter (Signed)
Texted and attempted to schedule appointment.  He said he would schedule but did not respond.  He has been difficult to get a hold of.  He does not answer to phone calls.  Will sometimes respond to texts.

## 2022-06-14 ENCOUNTER — Other Ambulatory Visit: Payer: Self-pay

## 2022-06-15 ENCOUNTER — Other Ambulatory Visit: Payer: Self-pay

## 2022-06-17 ENCOUNTER — Other Ambulatory Visit (HOSPITAL_COMMUNITY): Payer: Medicaid Other

## 2022-06-18 NOTE — Progress Notes (Signed)
[name] has been discharged from Paramedicine due to inability to communicate by phone or text and establish home visits.  I know he has a job and has been working a lot.  I have offered to visit him at his job or any other locations but he declined.  He has been very cordial with the few encounters I have had with him.  Should he have a need for Paramedicine outreach in the future I will be glad to assist.

## 2022-06-21 ENCOUNTER — Other Ambulatory Visit: Payer: Self-pay

## 2022-06-21 ENCOUNTER — Other Ambulatory Visit (HOSPITAL_COMMUNITY): Payer: Self-pay | Admitting: Family Medicine

## 2022-06-21 DIAGNOSIS — I5023 Acute on chronic systolic (congestive) heart failure: Secondary | ICD-10-CM

## 2022-06-21 DIAGNOSIS — I10 Essential (primary) hypertension: Secondary | ICD-10-CM

## 2022-06-21 MED ORDER — POTASSIUM CHLORIDE CRYS ER 20 MEQ PO TBCR
20.0000 meq | EXTENDED_RELEASE_TABLET | Freq: Two times a day (BID) | ORAL | 11 refills | Status: DC
  Filled 2022-06-21 – 2022-07-02 (×2): qty 60, 30d supply, fill #0
  Filled 2022-07-22 – 2022-08-24 (×3): qty 60, 30d supply, fill #1
  Filled 2022-09-21: qty 60, 30d supply, fill #2

## 2022-06-23 ENCOUNTER — Other Ambulatory Visit: Payer: Self-pay

## 2022-06-25 ENCOUNTER — Other Ambulatory Visit: Payer: Self-pay

## 2022-06-29 ENCOUNTER — Other Ambulatory Visit: Payer: Self-pay

## 2022-07-02 ENCOUNTER — Ambulatory Visit: Payer: Medicaid Other | Attending: Nurse Practitioner | Admitting: Nurse Practitioner

## 2022-07-02 ENCOUNTER — Other Ambulatory Visit: Payer: Self-pay

## 2022-07-02 ENCOUNTER — Encounter: Payer: Self-pay | Admitting: Nurse Practitioner

## 2022-07-02 VITALS — BP 125/82 | HR 63 | Temp 97.9°F | Ht 76.0 in | Wt 288.8 lb

## 2022-07-02 DIAGNOSIS — E1122 Type 2 diabetes mellitus with diabetic chronic kidney disease: Secondary | ICD-10-CM

## 2022-07-02 DIAGNOSIS — E119 Type 2 diabetes mellitus without complications: Secondary | ICD-10-CM | POA: Diagnosis not present

## 2022-07-02 DIAGNOSIS — I10 Essential (primary) hypertension: Secondary | ICD-10-CM

## 2022-07-02 DIAGNOSIS — N182 Chronic kidney disease, stage 2 (mild): Secondary | ICD-10-CM | POA: Diagnosis not present

## 2022-07-02 DIAGNOSIS — Z794 Long term (current) use of insulin: Secondary | ICD-10-CM | POA: Diagnosis not present

## 2022-07-02 DIAGNOSIS — Z1211 Encounter for screening for malignant neoplasm of colon: Secondary | ICD-10-CM

## 2022-07-02 LAB — POCT GLYCOSYLATED HEMOGLOBIN (HGB A1C): HbA1c POC (<> result, manual entry): 7.3 % (ref 4.0–5.6)

## 2022-07-02 LAB — GLUCOSE, POCT (MANUAL RESULT ENTRY): POC Glucose: 164 mg/dl — AB (ref 70–99)

## 2022-07-02 NOTE — Progress Notes (Unsigned)
Assessment & Plan:  Douglas [name] was seen today for diabetes.  Diagnoses and all orders for this visit:  Type 2 diabetes mellitus without complication, with long-term current use of insulin  Continue blood sugar control as discussed in office today, low carbohydrate diet, and regular physical exercise as tolerated, 150 minutes per week (30 min each day, 5 days per week, or 50 min 3 days per week). Keep blood sugar logs with fasting goal of 90-130 mg/dl, post prandial (after you eat) less than 180.  For Hypoglycemia: BS <60 and Hyperglycemia BS >400; contact the clinic ASAP. Annual eye exams and foot exams are recommended.   Diabetes mellitus with stage 2 chronic kidney disease (HCC) -     POCT glycosylated hemoglobin (Hb A1C) -     Ambulatory referral to Ophthalmology -     POCT glucose (manual entry)  Essential hypertension -     Basic metabolic panel  Colon cancer screening -     Ambulatory referral to Gastroenterology    Patient has been counseled on age-appropriate routine health concerns for screening and prevention. These are reviewed and up-to-date. Referrals have been placed accordingly. Immunizations are up-to-date or declined.    Subjective:   Chief Complaint  Patient presents with   Diabetes   [name] 46 y.o. male presents to office today for follow up to DM and HTN.  He has a past medical history of Depression, major, single episode (01/14/2017), CAD, HPL, history of substance abuse, DM2, ED, History of syncope (01/29/2015), Hypertension, ICD discharge (11/30/2014), Nonischemic cardiomyopathy, NSVT, Obesity, and Acute on chronic Systolic CHF   Being followed by Cardiology and has appt next month  Started smoking 15 years ago. Smokes about 5 cigarettes a day. The most he has smoked was half a pack of cigarettes per day.  States he had an episode of presyncope at work a few weeks ago. Temperature was very hot in the kitchen and he could feel himself feeling  faint. He was able to hold on to a table and became weak. He did fall onto the floor but did not hit his head.   DM 2 Diabetes is not at goal. A1c has decreased from last visit. He works at [employer] but is trying to eat healthier even with limited food options. He is still drinking sodas and juices. Aware that they are contributing to his diabetes not being at goal. States he is trying to cut back. Currently prescribed lantus 36 units daily and farixga 10 mg daily Lab Results  Component Value Date   HGBA1C 7.3 07/02/2022  LDL not at goal with atorvastatin 38m daily. He is not fasting today so unable to draw full lipid pane. Lab Results  Component Value Date   LDLCALC 133 (H) 03/01/2022     HTN Blood pressure is well controlled. Taking carvedilol 3.125 mg BID, entresto 49-51 mg BID, spironolactone 25 mg daily and torsemide 100 mg in am and 50 mg pm. Taking metolazone every other week.  BP Readings from Last 3 Encounters:  07/02/22 125/82  06/02/22 102/78  04/28/22 98/60     Lab Results  Component Value Date   HGBA1C 7.3 07/02/2022     Review of Systems  Constitutional:  Negative for fever, malaise/fatigue and weight loss.  HENT: Negative.  Negative for nosebleeds.   Eyes: Negative.  Negative for blurred vision, double vision and photophobia.  Respiratory: Negative.  Negative for cough and shortness of breath.   Cardiovascular: Negative.  Negative  for chest pain, palpitations and leg swelling.  Gastrointestinal: Negative.  Negative for heartburn, nausea and vomiting.  Musculoskeletal: Negative.  Negative for myalgias.  Neurological: Negative.  Negative for dizziness, focal weakness, seizures and headaches.  Psychiatric/Behavioral: Negative.  Negative for suicidal ideas.     Past Medical History:  Diagnosis Date   Depression, major, single episode 01/14/2017   Diabetes mellitus [facility])    ED (erectile dysfunction)    History of syncope 01/29/2015   Hypertension    ICD  (implantable cardioverter-defibrillator) discharge 11/30/2014   On 11/30/14. Asymptomatic.    Nonischemic cardiomyopathy ([place])    a.  echo 4/06: EF 30%, mild to mod MR, mild RAE, inf HK, lat HK , ant HK;    b.  cath 4/06: no CAD, EF 20-25%   NSVT (nonsustained ventricular tachycardia) (HCC)    Obesity    Systolic CHF, chronic ([place])    EF 11/17 25-30%, s/p ICD    Past Surgical History:  Procedure Laterality Date   CARDIAC CATHETERIZATION  09/2011; 02/2013; 04/2013   CARDIAC DEFIBRILLATOR PLACEMENT  08/23/2013   IMPLANTABLE CARDIOVERTER DEFIBRILLATOR IMPLANT N/A 08/23/2013   Procedure: IMPLANTABLE CARDIOVERTER DEFIBRILLATOR IMPLANT;  Surgeon: Deboraha Sprang, MD;  Location: [facility] CATH LAB;  Service: Cardiovascular;  Laterality: N/A;   LEFT AND RIGHT HEART CATHETERIZATION WITH CORONARY ANGIOGRAM N/A 09/20/2011   Procedure: LEFT AND RIGHT HEART CATHETERIZATION WITH CORONARY ANGIOGRAM;  Surgeon: Jolaine Artist, MD;  Location: [facility] CATH LAB;  Service: Cardiovascular;  Laterality: N/A;   MULTIPLE EXTRACTIONS WITH ALVEOLOPLASTY N/A 01/26/2013   Procedure:  EXTRACION  TOOTH # 19 WITH ALVEOLOPLASTY;  Surgeon: Lenn Cal, DDS;  Location: [place];  Service: Oral Surgery;  Laterality: N/A;   RIGHT HEART CATHETERIZATION N/A 02/22/2013   Procedure: RIGHT HEART CATH;  Surgeon: Jolaine Artist, MD;  Location: [facility] CATH LAB;  Service: Cardiovascular;  Laterality: N/A;   RIGHT HEART CATHETERIZATION N/A 05/03/2013   Procedure: RIGHT HEART CATH;  Surgeon: Jolaine Artist, MD;  Location: [facility] CATH LAB;  Service: Cardiovascular;  Laterality: N/A;   RIGHT/LEFT HEART CATH AND CORONARY ANGIOGRAPHY N/A 03/03/2022   Procedure: RIGHT/LEFT HEART CATH AND CORONARY ANGIOGRAPHY;  Surgeon: Jolaine Artist, MD;  Location: [place] CV LAB;  Service: Cardiovascular;  Laterality: N/A;    Family History  Problem Relation Age of Onset   Coronary artery disease Mother 54       s/p PCI   Lung cancer Father    Diabetes type  II Maternal Uncle    Coronary artery disease Maternal Uncle    Stroke Neg Hx    Heart attack Neg Hx     Social History Reviewed with no changes to be made today.   Outpatient Medications Prior to Visit  Medication Sig Dispense Refill   Accu-Chek Softclix Lancets lancets Check blood sugar twice daily E11.8 100 each 2   acetaminophen (TYLENOL) 500 MG tablet Take 1 tablet (500 mg total) by mouth every 6 (six) hours as needed. 30 tablet 0   aspirin 81 MG EC tablet Take 1 tablet (81 mg total) by mouth daily. 30 tablet 5   atorvastatin (LIPITOR) 40 MG tablet Take 1 tablet (40 mg total) by mouth daily. 30 tablet 6   Blood Glucose Calibration (ACCU-CHEK GUIDE CONTROL) LIQD 1 each by In Vitro route once as needed for up to 1 dose. 1 each 0   Blood Glucose Monitoring Suppl (ACCU-CHEK GUIDE ME) w/Device KIT Check blood sugar twice daily E11.8 1 kit  0   carvedilol (COREG) 3.125 MG tablet Take 1 tablet (3.125 mg total) by mouth 2 (two) times daily with a meal. 60 tablet 3   dapagliflozin propanediol (FARXIGA) 10 MG TABS tablet Take 1 tablet (10 mg total) by mouth daily before breakfast. 90 tablet 3   glucose blood (ACCU-CHEK GUIDE) test strip Check blood sugar twice daily E11.8 100 each 0   insulin glargine (LANTUS SOLOSTAR) 100 UNIT/ML Solostar Pen Inject 36 Units into the skin once daily. 15 mL 1   metolazone (ZAROXOLYN) 2.5 MG tablet Take 1 pill every other Wednesday 8 tablet 1   Potassium Chloride (KLOR-CON PO) Take 20 mg by mouth 2 (two) times daily.     potassium chloride SA (KLOR-CON M) 20 MEQ tablet Take 1 tablet (20 mEq total) by mouth 2 (two) times daily. 60 tablet 11   sacubitril-valsartan (ENTRESTO) 49-51 MG TAKE 1 TABLET BY MOUTH 2 (TWO) TIMES DAILY. 60 tablet 11   torsemide (DEMADEX) 100 MG tablet Take 1 tablet (100 mg total) by mouth in the morning AND 0.5 tablets (50 mg total) every evening. 45 tablet 6   spironolactone (ALDACTONE) 25 MG tablet Take 1 tablet (25 mg total) by mouth once  daily. (Patient not taking: Reported on 07/02/2022) 90 tablet 0   No facility-administered medications prior to visit.    Allergies  Allergen Reactions   Bydureon [Exenatide] Rash   Penicillins Other (See Comments)    Unknown childhood reaction        Objective:    BP 125/82   Pulse 63   Temp 97.9 F (36.6 C) (Oral)   Ht _0  (1.93 m)   Wt 288 lb 12.8 oz (131 kg)   SpO2 98%   BMI 35.15 kg/m  Wt Readings from Last 3 Encounters:  07/02/22 288 lb 12.8 oz (131 kg)  06/02/22 295 lb 6.4 oz (134 kg)  04/28/22 (!) 302 lb (137 kg)    Physical Exam Vitals and nursing note reviewed.  Constitutional:      Appearance: He is well-developed.  HENT:     Head: Normocephalic and atraumatic.  Cardiovascular:     Rate and Rhythm: Normal rate and regular rhythm.     Heart sounds: Normal heart sounds. No murmur heard.    No friction rub. No gallop.  Pulmonary:     Effort: Pulmonary effort is normal. No tachypnea or respiratory distress.     Breath sounds: Normal breath sounds. No decreased breath sounds, wheezing, rhonchi or rales.  Chest:     Chest wall: No tenderness.  Abdominal:     General: Bowel sounds are normal.     Palpations: Abdomen is soft.  Musculoskeletal:        General: Normal range of motion.     Cervical back: Normal range of motion.  Skin:    General: Skin is warm and dry.  Neurological:     Mental Status: He is alert and oriented to person, place, and time.     Coordination: Coordination normal.  Psychiatric:        Behavior: Behavior normal. Behavior is cooperative.        Thought Content: Thought content normal.        Judgment: Judgment normal.          Patient has been counseled extensively about nutrition and exercise as well as the importance of adherence with medications and regular follow-up. The patient was given clear instructions to go to ER or return to medical center  if symptoms don't improve, worsen or new problems develop. The patient  verbalized understanding.   Follow-up: Return in about 3 months (around 10/02/2022) for DM.   Gildardo Pounds, FNP-BC [facility] and [place], [place]   07/03/2022, 8:10 AM

## 2022-07-03 ENCOUNTER — Encounter: Payer: Self-pay | Admitting: Nurse Practitioner

## 2022-07-03 LAB — BASIC METABOLIC PANEL
BUN/Creatinine Ratio: 15 (ref 9–20)
BUN: 23 mg/dL (ref 6–24)
CO2: 24 mmol/L (ref 20–29)
Calcium: 9.7 mg/dL (ref 8.7–10.2)
Chloride: 98 mmol/L (ref 96–106)
Creatinine, Ser: 1.55 mg/dL — ABNORMAL HIGH (ref 0.76–1.27)
Glucose: 133 mg/dL — ABNORMAL HIGH (ref 70–99)
Potassium: 4.1 mmol/L (ref 3.5–5.2)
Sodium: 138 mmol/L (ref 134–144)
eGFR: 55 mL/min/{1.73_m2} — ABNORMAL LOW (ref >59)

## 2022-07-22 ENCOUNTER — Encounter (HOSPITAL_COMMUNITY): Payer: Self-pay | Admitting: Internal Medicine

## 2022-07-22 ENCOUNTER — Ambulatory Visit (HOSPITAL_COMMUNITY)
Admission: RE | Admit: 2022-07-22 | Discharge: 2022-07-22 | Disposition: A | Discharge status: Home / Self Care | Payer: Medicaid Other | Source: Ambulatory Visit | Attending: Internal Medicine | Admitting: Internal Medicine

## 2022-07-22 VITALS — BP 108/62 | HR 76 | Wt 297.6 lb

## 2022-07-22 DIAGNOSIS — I428 Other cardiomyopathies: Secondary | ICD-10-CM | POA: Diagnosis not present

## 2022-07-22 DIAGNOSIS — I2510 Atherosclerotic heart disease of native coronary artery without angina pectoris: Secondary | ICD-10-CM | POA: Diagnosis not present

## 2022-07-22 DIAGNOSIS — F1490 Cocaine use, unspecified, uncomplicated: Secondary | ICD-10-CM | POA: Diagnosis not present

## 2022-07-22 DIAGNOSIS — Z794 Long term (current) use of insulin: Secondary | ICD-10-CM | POA: Insufficient documentation

## 2022-07-22 DIAGNOSIS — I10 Essential (primary) hypertension: Secondary | ICD-10-CM

## 2022-07-22 DIAGNOSIS — Z7984 Long term (current) use of oral hypoglycemic drugs: Secondary | ICD-10-CM | POA: Diagnosis not present

## 2022-07-22 DIAGNOSIS — I110 Hypertensive heart disease with heart failure: Secondary | ICD-10-CM | POA: Diagnosis not present

## 2022-07-22 DIAGNOSIS — E669 Obesity, unspecified: Secondary | ICD-10-CM | POA: Insufficient documentation

## 2022-07-22 DIAGNOSIS — I472 Ventricular tachycardia, unspecified: Secondary | ICD-10-CM | POA: Insufficient documentation

## 2022-07-22 DIAGNOSIS — E119 Type 2 diabetes mellitus without complications: Secondary | ICD-10-CM | POA: Insufficient documentation

## 2022-07-22 DIAGNOSIS — I5042 Chronic combined systolic (congestive) and diastolic (congestive) heart failure: Secondary | ICD-10-CM | POA: Diagnosis not present

## 2022-07-22 DIAGNOSIS — Z4502 Encounter for adjustment and management of automatic implantable cardiac defibrillator: Secondary | ICD-10-CM | POA: Diagnosis not present

## 2022-07-22 DIAGNOSIS — I5022 Chronic systolic (congestive) heart failure: Secondary | ICD-10-CM | POA: Diagnosis not present

## 2022-07-22 DIAGNOSIS — E785 Hyperlipidemia, unspecified: Secondary | ICD-10-CM | POA: Insufficient documentation

## 2022-07-22 DIAGNOSIS — F1410 Cocaine abuse, uncomplicated: Secondary | ICD-10-CM | POA: Insufficient documentation

## 2022-07-22 DIAGNOSIS — Z79899 Other long term (current) drug therapy: Secondary | ICD-10-CM | POA: Insufficient documentation

## 2022-07-22 DIAGNOSIS — Z720 Tobacco use: Secondary | ICD-10-CM

## 2022-07-22 DIAGNOSIS — I4729 Other ventricular tachycardia: Secondary | ICD-10-CM

## 2022-07-22 DIAGNOSIS — F17200 Nicotine dependence, unspecified, uncomplicated: Secondary | ICD-10-CM | POA: Insufficient documentation

## 2022-07-22 LAB — BASIC METABOLIC PANEL
Anion gap: 6 (ref 5–15)
BUN: 18 mg/dL (ref 6–20)
CO2: 22 mmol/L (ref 22–32)
Calcium: 8.2 mg/dL — ABNORMAL LOW (ref 8.9–10.3)
Chloride: 109 mmol/L (ref 98–111)
Creatinine, Ser: 1.37 mg/dL — ABNORMAL HIGH (ref 0.61–1.24)
GFR, Estimated: >60 mL/min (ref >60)
Glucose, Bld: 163 mg/dL — ABNORMAL HIGH (ref 70–99)
Potassium: 3.9 mmol/L (ref 3.5–5.1)
Sodium: 137 mmol/L (ref 135–145)

## 2022-07-22 LAB — BRAIN NATRIURETIC PEPTIDE: B Natriuretic Peptide: 557.4 pg/mL — ABNORMAL HIGH (ref 0.0–100.0)

## 2022-07-22 NOTE — Progress Notes (Signed)
[facility]  Patient ID: Douglas [name], male   DOB: [birth year], 46 y.o.   MRN: [id number]  PCP: [name] NP EP: Dr Caryl Comes HF Cardiologist: Dr. Haroldine Laws  HPI: Douglas [name] is a 46 y.o. male with a h/o HTN, HL, obesity, smoker, cocaine abuse,  NICM and chronic systolic heart failure. He is S/P Medtronic ICD due to NICM. Management has been complicated by severe non-compliance.   Underwent cath in 2006 which showed normal cors with EF 20-25%.   EF had recovered in late 2013 but has again fallen to 15% per echo 01/20/13.  He was admitted to [facility] 02/21/13 from clinic for low output symptoms and volume overload.  He required IV lasix and metolazone.  He diuresed 19 pounds with discharge weight of 254 pounds.  He had runs of VT therefore he was started on amiodarone per EP and Lifevest was placed at discharge.  He underwent RHC on 02/22/13.  He has not required metolazone.   He was admitted in 1/16 with VT and ICD shock x 2.   Echo 6/20 -->EF 25-30%   He was COVID + 11/14/20. He quarantined and recovered.   Echo 04/24/21 EF 30-35% RV ok.   Seen in clinic 6/22, euvolemic with stable NYHA II symptoms. 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  Admitted 4/23 with a/c CHF after not taking meds x 1 week. UDS + cocaine. Echo showed EF 25-30%, LV severely dilated, RV moderately down, moderate to severe MR. Underwent R/LHC showing elevated filling pressures, moderately reduced CO, and mild non obs CAD. He was diuresed with IV lasix, down 21 lbs. GDMT titrated and paramedicine arranged. Discharged home, weight 279 lbs.  Follow up 5/23, OptiVol up and torsemide increased, weight 299 lbs.   Follow up 6/23, NYHA II despite volume up ~ 10 lbs. Torsemide increased to 100/50 and instructed to take metolazone 2.5/40 KCL.  Today he returns for HF follow up. Feeling ok. Still working FT at [employer]. Gets around pretty good. Fluid up and down. Taking  metolazone every other Wednesday. Fluid much improved. Compliant with meds. Still smoking.    Cardiac Studies: - Echo (4/23): EF 25-30%, LV severely dilated, RV moderately down, moderate to severe MR  - R/LHC (4/23):    Prox RCA lesion is 30% stenosed.   Dist RCA lesion is 50% stenosed.   Ost Cx to Prox Cx lesion is 20% stenosed.   Prox LAD to Mid LAD lesion is 40% stenosed.   Dist LAD lesion is 20% stenosed.   The left ventricular ejection fraction is 25-35% by visual estimate.   Ao = 97/76 (85) LV = 98/30 RA =  14 RV = 66/17 PA = 66/26 (47) PCW = 26 Fick cardiac output/index = 5.6/2.1 PVR = 4.0 WU SVR = 983 Ao sat = 92% PA sat = 60%, 65%   1. Severe NICM EF 25% 2. Mild non-obstructive CAD 3. Elevated filling pressures with moderately reduced CO 4. Moderate mixed pulmonary HTN  - RHC 02/22/13  RA = 12  RV = 49/6/15  PA = 54/29 (41)  PCW = 28 (v= 40)  Fick cardiac output/index = 4.3/1.7  PVR = 2.6 [place]  SVR = 1580 dynes  FA sat = 98%  PA sat = 53%, 54%  Non-invasive BP = 124/86 (97)  - RHC 05/03/13 RA = 2  RV = 29/2/2  PA = 26/10 (17)  PCW = 3  Fick cardiac  output/index = 6.1/2.3  PVR = 2.3  FA sat = 93%  PA sat = 65%, 67%  - ECHO 06/25/13 EF 20-25% - ECHO 05/01/14 EF 35-40%, moderately dilated LV, mild LVH, diffuse hypokinesis - Echo 6/16 EF 25-30%, mild LVH, moderately dilated LV.  - ECHO 09/23/2016 EF 25-30%. - ECHO 08/19/2017 EF 40-45% grade IDD  - CPX 06/2011 VO2 20  SH: lives with his aunt. Rare ETOH, smokes rarely. Frequent cocaine. FH: Mom has CAD.    Review of systems complete and found to be negative unless listed in HPI.   Past Medical History:  Diagnosis Date   Depression, major, single episode 01/14/2017   Diabetes mellitus [facility])    ED (erectile dysfunction)    History of syncope 01/29/2015   Hypertension    ICD (implantable cardioverter-defibrillator) discharge 11/30/2014   On 11/30/14. Asymptomatic.    Nonischemic cardiomyopathy ([place])     a.  echo 4/06: EF 30%, mild to mod MR, mild RAE, inf HK, lat HK , ant HK;    b.  cath 4/06: no CAD, EF 20-25%   NSVT (nonsustained ventricular tachycardia) (HCC)    Obesity    Systolic CHF, chronic (HCC)    EF 11/17 25-30%, s/p ICD   Current Outpatient Medications  Medication Sig Dispense Refill   Accu-Chek Softclix Lancets lancets Check blood sugar twice daily E11.8 100 each 2   acetaminophen (TYLENOL) 500 MG tablet Take 1 tablet (500 mg total) by mouth every 6 (six) hours as needed. 30 tablet 0   aspirin 81 MG EC tablet Take 1 tablet (81 mg total) by mouth daily. 30 tablet 5   atorvastatin (LIPITOR) 40 MG tablet Take 1 tablet (40 mg total) by mouth daily. 30 tablet 6   Blood Glucose Calibration (ACCU-CHEK GUIDE CONTROL) LIQD 1 each by In Vitro route once as needed for up to 1 dose. 1 each 0   Blood Glucose Monitoring Suppl (ACCU-CHEK GUIDE ME) w/Device KIT Check blood sugar twice daily E11.8 1 kit 0   carvedilol (COREG) 3.125 MG tablet Take 1 tablet (3.125 mg total) by mouth 2 (two) times daily with a meal. 60 tablet 3   dapagliflozin propanediol (FARXIGA) 10 MG TABS tablet Take 1 tablet (10 mg total) by mouth daily before breakfast. 90 tablet 3   glucose blood (ACCU-CHEK GUIDE) test strip Check blood sugar twice daily E11.8 100 each 0   insulin glargine (LANTUS SOLOSTAR) 100 UNIT/ML Solostar Pen Inject 36 Units into the skin once daily. 15 mL 1   metolazone (ZAROXOLYN) 2.5 MG tablet Take 1 pill every other Wednesday 8 tablet 1   potassium chloride SA (KLOR-CON M) 20 MEQ tablet Take 1 tablet (20 mEq total) by mouth 2 (two) times daily. 60 tablet 11   sacubitril-valsartan (ENTRESTO) 49-51 MG TAKE 1 TABLET BY MOUTH 2 (TWO) TIMES DAILY. 60 tablet 11   torsemide (DEMADEX) 100 MG tablet Take 1 tablet (100 mg total) by mouth in the morning AND 0.5 tablets (50 mg total) every evening. 45 tablet 6   No current facility-administered medications for this encounter.   Allergies  Allergen Reactions    Bydureon [Exenatide] Rash   Penicillins Other (See Comments)    Unknown childhood reaction    BP 108/62   Pulse 76   Wt 135 kg (297 lb 9.6 oz)   SpO2 97%   BMI 36.23 kg/m   Wt Readings from Last 3 Encounters:  07/22/22 135 kg (297 lb 9.6 oz)  07/02/22 131 kg (288  lb 12.8 oz)  06/02/22 134 kg (295 lb 6.4 oz)   Physical Exam: General:  Well appearing. No resp difficulty HEENT: normal Neck: supple. no JVD. Carotids 2+ bilat; no bruits. No lymphadenopathy or thryomegaly appreciated. Cor: PMI nondisplaced. Regular rate & rhythm. No rubs, gallops or murmurs. Lungs: clear Abdomen: obese, soft, nontender, nondistended. No hepatosplenomegaly. No bruits or masses. Good bowel sounds. Extremities: no cyanosis, clubbing, rash, edema Neuro: alert & orientedx3, cranial nerves grossly intact. moves all 4 extremities w/o difficulty. Affect pleasant   ICD interrogation (personally reviewed): OptiVol trending back up, thoracic impedence down, 3 hrs day/activity, no VT.  Assessment/Plan: Chronic Systolic Heart Failure - NICM by cath in 2006. Medtronic ICD.  - Echo 04/2021 EF 30-35%.  - Echo (4/23): EF 25-30%, LV severely dilated, RV moderately down, moderate to severe MR - RHC/LHC (4/23): with elevated filling pressures, moderately reduced CO, mild nonob CAD, and severe NICM. RA 14 , PCWP 26.  - Start NYHA II, volume status improved  - Continue metolazone 2.5 mg/extra 40 KCL every other Wednesday (start today). - Continue torsemide 100 mg am/50 mg pm and KCL 60 bid. - Continue Farxiga 10 mg daily - Continue carvedilol 3.125 mg bid. - Continue Entresto 49/51 mg bid. No BP room to increase today. - Continue spiro 25 mg daily. - Discussed limiting salt/fluid intake, and weighing daily.  - BMET/BNP today, repeat BMET in 2 weeks.  2. NSVT  - Denies palpitations. ICD interrogation as above. - On beta blocker.   3. DM2 - On insulin. - Hgb A1C 7.4  - Restart SGLT2i.   4.  Hyperlipidemia - LDL 133 (4/23) - Continue atorvastatin.   5. Cocaine abuse - Continues to use ~weekly.  - Discussed cessation   6.Tobacco Abuse - Encouraged cessation   7. CAD - LHC (4/23): mild non ob CAD cath. - No s/s angina - Continue asa + statin.   8. SDOH - He has Medicaid. Discussed switching pharmacies to [place] that will waive co-pay fee. - HFSW helping with resources.  Glori Bickers, MD  2:33 PM

## 2022-07-22 NOTE — Patient Instructions (Signed)
Medication Changes:  none  Lab Work:  Labs done today, your results will be available in MyChart, we will contact you for abnormal readings.  Testing/Procedures:  Your physician has requested that you have an echocardiogram. Echocardiography is a painless test that uses sound waves to create images of your heart. It provides your doctor with information about the size and shape of your heart and how well your heart's chambers and valves are working. This procedure takes approximately one hour. There are no restrictions for this procedure. IN 6 MONTHS  Referrals:  none  Special Instructions // Education:  Do the following things EVERYDAY: Weigh yourself in the morning before breakfast. Write it down and keep it in a log. Take your medicines as prescribed Eat low salt foods--Limit salt (sodium) to 2000 mg per day.  Stay as active as you can everyday Limit all fluids for the day to less than 2 liters   Follow-Up in: 6 months with an echocardiogram, **PLEASE CALL OUR OFFICE IN NOVEMBER TO SCHEDULE THIS APPPOINTMENT  At the [facility], you and your health needs are our priority. We have a designated team specialized in the treatment of Heart Failure. This Care Team includes your primary Heart Failure Specialized Cardiologist (physician), Advanced Practice Providers (APPs- Physician Assistants and Nurse Practitioners), and Pharmacist who all work together to provide you with the care you need, when you need it.   You may see any of the following providers on your designated Care Team at your next follow up:  Dr. [name] Dr. Marca Ancona Dr. Marcos Eke, NP Robbie Lis, [place] [facility] [place], [place] Brynda Peon, NP Karle Plumber, PharmD   Please be sure to bring in all your medications bottles to every appointment.   Need to Contact us:  If you have any questions or concerns before your next appointment please send [place] a  message through [place] or call our office at [phone].    TO LEAVE A MESSAGE FOR THE NURSE SELECT OPTION 2, PLEASE LEAVE A MESSAGE INCLUDING: YOUR NAME DATE OF BIRTH CALL BACK NUMBER REASON FOR CALL**this is important as we prioritize the call backs  YOU WILL RECEIVE A CALL BACK THE SAME DAY AS LONG AS YOU CALL BEFORE 4:00 PM

## 2022-07-23 ENCOUNTER — Other Ambulatory Visit: Payer: Self-pay

## 2022-07-26 ENCOUNTER — Encounter (HOSPITAL_COMMUNITY): Payer: Self-pay

## 2022-07-26 ENCOUNTER — Ambulatory Visit (HOSPITAL_COMMUNITY)
Admission: EM | Admit: 2022-07-26 | Discharge: 2022-07-26 | Disposition: A | Discharge status: Home / Self Care | Payer: Medicaid Other

## 2022-07-26 DIAGNOSIS — B349 Viral infection, unspecified: Secondary | ICD-10-CM | POA: Diagnosis not present

## 2022-07-26 DIAGNOSIS — F17200 Nicotine dependence, unspecified, uncomplicated: Secondary | ICD-10-CM

## 2022-07-26 NOTE — Discharge Instructions (Addendum)
Avoid alcohol,cocaine,smoking,etc. May take over the counter emetrol as label directed. Keep an eye on your blood sugar when sick as it may be elevated. Follow up with PCP. Go to Er for new or worsening issues or concerns(unable to keep fluids down, elevated BS over 300, chest pain,shortness of breath, etc).

## 2022-07-26 NOTE — ED Triage Notes (Signed)
Onset 2 days ago with upset stomach, constipation, emesis, and dizziness. Patient took some antidiarrhea medication last night. Patient states with the dizziness, room spinning when standing.   No known sick exposure, no one around the Patient with similar symptoms. No medication or dietary changes.   Patient was able to keep down chips and water today. Last threw up last night.

## 2022-07-26 NOTE — ED Provider Notes (Signed)
[place]    CSN: [id number] Arrival date & time: 07/26/22  1442      History   Chief Complaint Chief Complaint  Patient presents with   Emesis   Dizziness    [name] is a 46 y.o. male.   46 year old male, Douglas [name], presents to Urgent care for emesis yesterday w dizziness, pt states he drank alcohol(more than usual) over the weekend and had the episode of vomiting and dizziness then, pt states he woke up late today(needs work note). Pt is able to keep po fluids down today per his report. Pt deneis Cp ,SOB, or palpitaitons. VSS in office today. Per pt report his BS was 140's this am, follows with cardiology and PCP regularly.   He has a past medical history of Depression, major, single episode (01/14/2017), CAD, HPL, history of substance abuse(cocaine), DM2, ED, History of syncope (01/29/2015), Hypertension, ICD discharge (11/30/2014), Nonischemic cardiomyopathy, NSVT, Obesity, and Acute on chronic Systolic CHF   The history is provided by the patient. No language interpreter was used.    Past Medical History:  Diagnosis Date   Depression, major, single episode 01/14/2017   Diabetes mellitus [facility])    ED (erectile dysfunction)    History of syncope 01/29/2015   Hypertension    ICD (implantable cardioverter-defibrillator) discharge 11/30/2014   On 11/30/14. Asymptomatic.    Nonischemic cardiomyopathy ([place])    a.  echo 4/06: EF 30%, mild to mod MR, mild RAE, inf HK, lat HK , ant HK;    b.  cath 4/06: no CAD, EF 20-25%   NSVT (nonsustained ventricular tachycardia) (HCC)    Obesity    Systolic CHF, chronic ([place])    EF 11/17 25-30%, s/p ICD    Patient Active Problem List   Diagnosis Date Noted   Nonspecific syndrome suggestive of viral illness 07/26/2022   Smoker 07/26/2022   Acute embolism and thrombosis of deep vein of lower extremity, unspecified laterality ([place]) 03/31/2022   Seizure ([place]) 03/31/2022   Acute on chronic combined systolic and  diastolic CHF (congestive heart failure) ([place]) 02/28/2022   Onychomycosis 01/16/2017   Depression, major, single episode 01/14/2017   Allergic rhinitis 04/28/2015   Chest pain 04/21/2015   Prolonged Q-T interval on ECG 01/31/2015   ICD (MDT) in place 01/29/2015   Obesity 01/29/2015   Healthcare maintenance 12/11/2014   Cocaine use 06/23/2012   Tobacco use 09/20/2011   VT (ventricular tachycardia) ([place]) 09/19/2011   ED (erectile dysfunction) 08/02/2011   Chronic combined systolic and diastolic congestive heart failure ([place]) 06/21/2011   Diabetes mellitus with stage 2 chronic kidney disease ([place]) 04/28/2009   Hyperlipidemia 04/28/2009   Essential hypertension 04/28/2009    Past Surgical History:  Procedure Laterality Date   CARDIAC CATHETERIZATION  09/2011; 02/2013; 04/2013   CARDIAC DEFIBRILLATOR PLACEMENT  08/23/2013   IMPLANTABLE CARDIOVERTER DEFIBRILLATOR IMPLANT N/A 08/23/2013   Procedure: IMPLANTABLE CARDIOVERTER DEFIBRILLATOR IMPLANT;  Surgeon: Deboraha Sprang, MD;  Location: [facility] CATH LAB;  Service: Cardiovascular;  Laterality: N/A;   LEFT AND RIGHT HEART CATHETERIZATION WITH CORONARY ANGIOGRAM N/A 09/20/2011   Procedure: LEFT AND RIGHT HEART CATHETERIZATION WITH CORONARY ANGIOGRAM;  Surgeon: Jolaine Artist, MD;  Location: [facility] CATH LAB;  Service: Cardiovascular;  Laterality: N/A;   MULTIPLE EXTRACTIONS WITH ALVEOLOPLASTY N/A 01/26/2013   Procedure:  EXTRACION  TOOTH # 19 WITH ALVEOLOPLASTY;  Surgeon: Lenn Cal, DDS;  Location: [place];  Service: Oral Surgery;  Laterality: N/A;   RIGHT HEART  CATHETERIZATION N/A 02/22/2013   Procedure: RIGHT HEART CATH;  Surgeon: Jolaine Artist, MD;  Location[facility] CATH LAB;  Service: Cardiovascular;  Laterality: N/A;   RIGHT HEART CATHETERIZATION N/A 05/03/2013   Procedure: RIGHT HEART CATH;  Surgeon: Jolaine Artist, MD;  Location: [facility] CATH LAB;  Service: Cardiovascular;  Laterality: N/A;   RIGHT/LEFT HEART CATH AND CORONARY ANGIOGRAPHY N/A  03/03/2022   Procedure: RIGHT/LEFT HEART CATH AND CORONARY ANGIOGRAPHY;  Surgeon: Jolaine Artist, MD;  Location: [place] CV LAB;  Service: Cardiovascular;  Laterality: N/A;       Home Medications    Prior to Admission medications   Medication Sig Start Date End Date Taking? Authorizing Provider  acetaminophen (TYLENOL) 500 MG tablet Take 1 tablet (500 mg total) by mouth every 6 (six) hours as needed. 04/29/21  Yes Gildardo Pounds, NP  aspirin 81 MG EC tablet Take 1 tablet (81 mg total) by mouth daily. 03/05/22  Yes Lyda Jester M, PA-C  atorvastatin (LIPITOR) 40 MG tablet Take 1 tablet (40 mg total) by mouth daily. 03/05/22 03/05/23 Yes Simmons, Brittainy M, PA-C  carvedilol (COREG) 3.125 MG tablet Take 1 tablet (3.125 mg total) by mouth 2 (two) times daily with a meal. 04/28/22  Yes [place], Maricela Bo, FNP  dapagliflozin propanediol (FARXIGA) 10 MG TABS tablet Take 1 tablet (10 mg total) by mouth daily before breakfast. 06/02/22 06/02/23 Yes [place], Maricela Bo, FNP  insulin glargine (LANTUS SOLOSTAR) 100 UNIT/ML Solostar Pen Inject 36 Units into the skin once daily. 04/28/22  Yes Charlott Rakes, MD  metolazone (ZAROXOLYN) 2.5 MG tablet Take 1 pill every other Wednesday 06/02/22 08/31/22 Yes [place], Maricela Bo, FNP  potassium chloride SA (KLOR-CON M) 20 MEQ tablet Take 1 tablet (20 mEq total) by mouth 2 (two) times daily. 06/21/22  Yes Larey Dresser, MD  sacubitril-valsartan (ENTRESTO) 49-51 MG TAKE 1 TABLET BY MOUTH 2 (TWO) TIMES DAILY. 03/05/22 03/05/23 Yes Simmons, Brittainy M, PA-C  torsemide (DEMADEX) 100 MG tablet Take 1 tablet (100 mg total) by mouth in the morning AND 0.5 tablets (50 mg total) every evening. 04/28/22  Yes [place], Maricela Bo, FNP  Accu-Chek Softclix Lancets lancets Check blood sugar twice daily E11.8 04/03/21   Charlott Rakes, MD  Blood Glucose Calibration (ACCU-CHEK GUIDE CONTROL) LIQD 1 each by In Vitro route once as needed for up to 1 dose. 03/30/19   Gildardo Pounds, NP  Blood Glucose Monitoring Suppl (ACCU-CHEK GUIDE ME) w/Device KIT Check blood sugar twice daily E11.8 04/03/21   Charlott Rakes, MD  glucose blood (ACCU-CHEK GUIDE) test strip Check blood sugar twice daily E11.8 02/10/22   Charlott Rakes, MD    Family History Family History  Problem Relation Age of Onset   Coronary artery disease Mother 66       s/p PCI   Lung cancer Father    Diabetes type II Maternal Uncle    Coronary artery disease Maternal Uncle    Stroke Neg Hx    Heart attack Neg Hx     Social History Social History   Tobacco Use   Smoking status: Every Day    Packs/day: 0.25    Years: 8.00    Total pack years: 2.00    Types: Cigarettes   Smokeless tobacco: Never   Tobacco comments:    ~3 cigarettes a day  Vaping Use   Vaping Use: Never used  Substance Use Topics   Alcohol use: Yes    Alcohol/week: 0.0 standard drinks of  alcohol    Comment: 2x week.    Drug use: Not Currently    Types: Cocaine    Comment: 08/10/17 - last use one week ago     Allergies   Bydureon [exenatide] and Penicillins   Review of Systems Review of Systems  Gastrointestinal:  Positive for nausea and vomiting.  Neurological:  Positive for dizziness. Negative for headaches.  All other systems reviewed and are negative.    Physical Exam Triage Vital Signs ED Triage Vitals  Enc Vitals Group     BP 07/26/22 1556 97/73     Pulse Rate 07/26/22 1556 88     Resp 07/26/22 1556 16     Temp 07/26/22 1556 98.1 F (36.7 C)     Temp Source 07/26/22 1556 Oral     SpO2 07/26/22 1556 95 %     Weight 07/26/22 1559 290 lb (131.5 kg)     Height 07/26/22 1559 _0  (1.93 m)     Head Circumference --      Peak Flow --      Pain Score 07/26/22 1559 4     Pain Loc --      Pain Edu? --      Excl. in [place]? --    No data found.  Updated Vital Signs BP 97/73 (BP Location: Left Wrist)   Pulse 88   Temp 98.1 F (36.7 C) (Oral)   Resp 16   Ht _1  (1.93 m)   Wt 290 lb (131.5 kg)    SpO2 95%   BMI 35.30 kg/m   Visual Acuity Right Eye Distance:   Left Eye Distance:   Bilateral Distance:    Right Eye Near:   Left Eye Near:    Bilateral Near:     Physical Exam Vitals and nursing note reviewed.  Constitutional:      General: He is not in acute distress.    Appearance: He is well-developed.  HENT:     Head: Normocephalic and atraumatic.  Eyes:     Conjunctiva/sclera: Conjunctivae normal.  Cardiovascular:     Rate and Rhythm: Normal rate and regular rhythm.     Heart sounds: No murmur heard. Pulmonary:     Effort: Pulmonary effort is normal. No respiratory distress.     Breath sounds: Normal breath sounds.  Abdominal:     Palpations: Abdomen is soft.     Tenderness: There is no abdominal tenderness.  Musculoskeletal:        General: No swelling.     Cervical back: Neck supple.  Skin:    General: Skin is warm and dry.     Capillary Refill: Capillary refill takes less than 2 seconds.  Neurological:     General: No focal deficit present.     Mental Status: He is alert.     GCS: GCS eye subscore is 4. GCS verbal subscore is 5. GCS motor subscore is 6.     Cranial Nerves: Cranial nerves 2-12 are intact.     Sensory: Sensation is intact.     Motor: Motor function is intact.     Coordination: Coordination is intact.     Gait: Gait is intact.  Psychiatric:        Mood and Affect: Mood normal.      UC Treatments / Results  Labs (all labs ordered are listed, but only abnormal results are displayed) Labs Reviewed - No data to display  EKG   Radiology No results found.  Procedures Procedures (  including critical care time)  Medications Ordered in UC Medications - No data to display  Initial Impression / Assessment and Plan / UC Course  I have reviewed the triage vital signs and the nursing notes.  Pertinent labs & imaging results that were available during my care of the patient were reviewed by me and considered in my medical decision  making (see chart for details).     Ddx: Viral syndrome, alcohol intoxication(hangover), Vertigo  Pt verbalized understanding to this provider, strict return/go to ER precautions given. Final Clinical Impressions(s) / UC Diagnoses   Final diagnoses:  Nonspecific syndrome suggestive of viral illness  Smoker     Discharge Instructions      Avoid alcohol,cocaine,smoking,etc. May take over the counter emetrol as label directed. Keep an eye on your blood sugar when sick as it may be elevated. Follow up with PCP. Go to Er for new or worsening issues or concerns(unable to keep fluids down, elevated BS over 300, chest pain,shortness of breath, etc).     ED Prescriptions   None    PDMP not reviewed this encounter.   Tori Milks, NP 75/83/07 1802

## 2022-08-04 ENCOUNTER — Other Ambulatory Visit: Payer: Self-pay

## 2022-08-08 ENCOUNTER — Other Ambulatory Visit: Payer: Self-pay | Admitting: Family Medicine

## 2022-08-09 ENCOUNTER — Other Ambulatory Visit: Payer: Self-pay

## 2022-08-09 MED ORDER — LANTUS SOLOSTAR 100 UNIT/ML ~~LOC~~ SOPN
36.0000 [IU] | PEN_INJECTOR | Freq: Every day | SUBCUTANEOUS | 1 refills | Status: DC
  Filled 2022-08-09 – 2022-08-24 (×2): qty 15, 41d supply, fill #0
  Filled 2022-10-11: qty 15, 41d supply, fill #1

## 2022-08-09 MED ORDER — ACCU-CHEK GUIDE VI STRP
ORAL_STRIP | 0 refills | Status: DC
  Filled 2022-08-09: qty 100, 25d supply, fill #0
  Filled 2022-08-24: qty 100, 50d supply, fill #0

## 2022-08-09 NOTE — Telephone Encounter (Signed)
Requested Prescriptions  Pending Prescriptions Disp Refills  . glucose blood (ACCU-CHEK GUIDE) test strip 100 each 0    Sig: Check blood sugar twice daily E11.8     Endocrinology: Diabetes - Testing Supplies Passed - 08/08/2022  6:26 PM      Passed - Valid encounter within last 12 months    Recent Outpatient Visits          1 month ago Type 2 diabetes mellitus without complication, with long-term current use of insulin [facility])   [place], Vernia Buff, NP   4 months ago Hospital discharge follow-up   [place], [place] W, NP   11 months ago Diabetes mellitus with stage 2 chronic kidney disease [facility])   [place], [place] W, NP   1 year ago Diabetes mellitus with stage 2 chronic kidney disease [facility])   [place], Jarome Matin, RPH-CPP   1 year ago Diabetes mellitus with stage 2 chronic kidney disease [facility])   [place] Gildardo Pounds, NP      Future Appointments            In 1 month Gildardo Pounds, NP [place]           . insulin glargine (LANTUS SOLOSTAR) 100 UNIT/ML Solostar Pen 15 mL 1    Sig: Inject 36 Units into the skin once daily.     Endocrinology:  Diabetes - Insulins Passed - 08/08/2022  6:26 PM      Passed - HBA1C is between 0 and 7.9 and within 180 days    HbA1c, POC (controlled diabetic range)  Date Value Ref Range Status  06/02/2021 8.3 (A) 0.0 - 7.0 % Final   HbA1c POC (<> result, manual entry)  Date Value Ref Range Status  07/02/2022 7.3 4.0 - 5.6 % Final         Passed - Valid encounter within last 6 months    Recent Outpatient Visits          1 month ago Type 2 diabetes mellitus without complication, with long-term current use of insulin ([place])   [place], Vernia Buff, NP   4 months ago Hospital  discharge follow-up   [place], [place] W, NP   11 months ago Diabetes mellitus with stage 2 chronic kidney disease [facility])   [place], [place] W, NP   1 year ago Diabetes mellitus with stage 2 chronic kidney disease [facility])   [place], Jarome Matin, RPH-CPP   1 year ago Diabetes mellitus with stage 2 chronic kidney disease [facility])   [place] Gildardo Pounds, NP      Future Appointments            In 1 month Gildardo Pounds, NP [place]

## 2022-08-16 ENCOUNTER — Other Ambulatory Visit: Payer: Self-pay

## 2022-08-18 ENCOUNTER — Other Ambulatory Visit: Payer: Self-pay

## 2022-08-19 ENCOUNTER — Other Ambulatory Visit: Payer: Self-pay

## 2022-08-24 ENCOUNTER — Other Ambulatory Visit: Payer: Self-pay | Admitting: Nurse Practitioner

## 2022-08-24 ENCOUNTER — Other Ambulatory Visit: Payer: Self-pay

## 2022-08-24 DIAGNOSIS — E1122 Type 2 diabetes mellitus with diabetic chronic kidney disease: Secondary | ICD-10-CM

## 2022-08-24 MED ORDER — BD PEN NEEDLE NANO U/F 32G X 4 MM MISC
1 refills | Status: DC
  Filled 2022-08-24: qty 100, 90d supply, fill #0

## 2022-08-25 ENCOUNTER — Other Ambulatory Visit: Payer: Self-pay

## 2022-08-27 ENCOUNTER — Other Ambulatory Visit: Payer: Self-pay

## 2022-09-03 ENCOUNTER — Ambulatory Visit (INDEPENDENT_AMBULATORY_CARE_PROVIDER_SITE_OTHER): Payer: Medicaid Other

## 2022-09-03 ENCOUNTER — Telehealth (HOSPITAL_COMMUNITY): Payer: Self-pay

## 2022-09-03 DIAGNOSIS — I472 Ventricular tachycardia, unspecified: Secondary | ICD-10-CM

## 2022-09-03 NOTE — Telephone Encounter (Signed)
Patient called stating that his device made a sound earlier. I asked patient if he could transmit a reading to [place]. He said he wasn't at home but felt fine and stated he would send one whenever he got home. I also advised patient that he  should contact the device clinic as well. FYI

## 2022-09-06 ENCOUNTER — Telehealth: Payer: Self-pay

## 2022-09-06 LAB — CUP PACEART REMOTE DEVICE CHECK
Battery Remaining Longevity: 25 mo
Battery Voltage: 2.93 V
Brady Statistic RV Percent Paced: 0.19 %
Date Time Interrogation Session: 20231020173749
HighPow Impedance: 49 Ohm
HighPow Impedance: 63 Ohm
Implantable Lead Implant Date: 20141009
Implantable Lead Location: 753860
Implantable Lead Model: 7121
Implantable Pulse Generator Implant Date: 20141009
Lead Channel Impedance Value: 285 Ohm
Lead Channel Impedance Value: 988 Ohm
Lead Channel Pacing Threshold Amplitude: 2.25 V
Lead Channel Pacing Threshold Pulse Width: 0.4 ms
Lead Channel Sensing Intrinsic Amplitude: 16 mV
Lead Channel Sensing Intrinsic Amplitude: 16 mV
Lead Channel Setting Pacing Amplitude: 4.5 V
Lead Channel Setting Pacing Pulse Width: 0.4 ms
Lead Channel Setting Sensing Sensitivity: 0.45 mV

## 2022-09-06 NOTE — Telephone Encounter (Signed)
Pt called in stating he has been hearing the siren from his device since Friday and wants to know what is going on. Patient is at work and not able to send transmission, he sent one 09/03/2022

## 2022-09-06 NOTE — Telephone Encounter (Signed)
Returned call to Pt.  Advised his device was alarming because his RV lead impedance had increased.  His capture threshold has also increased, and his output has increased.  Pt last seen in 2015.  Pt scheduled to see Dr. Caryl Comes tomorrow 09/07/22 at 2:00 pm.

## 2022-09-07 ENCOUNTER — Encounter: Payer: Self-pay | Admitting: Internal Medicine

## 2022-09-07 ENCOUNTER — Ambulatory Visit: Payer: Medicaid Other

## 2022-09-07 ENCOUNTER — Ambulatory Visit: Payer: Medicaid Other | Attending: Internal Medicine | Admitting: Internal Medicine

## 2022-09-07 VITALS — BP 108/64 | HR 82 | Ht 76.0 in | Wt 295.2 lb

## 2022-09-07 DIAGNOSIS — I472 Ventricular tachycardia, unspecified: Secondary | ICD-10-CM

## 2022-09-07 LAB — CUP PACEART INCLINIC DEVICE CHECK
Battery Remaining Longevity: 25 mo
Battery Voltage: 2.87 V
Brady Statistic RV Percent Paced: 0.18 %
Date Time Interrogation Session: 20231024172413
HighPow Impedance: 52 Ohm
HighPow Impedance: 74 Ohm
Implantable Lead Connection Status: 753985
Implantable Lead Implant Date: 20141009
Implantable Lead Location: 753860
Implantable Lead Model: 7121
Implantable Pulse Generator Implant Date: 20141009
Lead Channel Impedance Value: 1045 Ohm
Lead Channel Impedance Value: 323 Ohm
Lead Channel Pacing Threshold Amplitude: 0.5 V
Lead Channel Pacing Threshold Amplitude: 2.25 V
Lead Channel Pacing Threshold Pulse Width: 0.4 ms
Lead Channel Pacing Threshold Pulse Width: 0.4 ms
Lead Channel Sensing Intrinsic Amplitude: 10.625 mV
Lead Channel Sensing Intrinsic Amplitude: 11.375 mV
Lead Channel Setting Pacing Amplitude: 4.5 V
Lead Channel Setting Pacing Pulse Width: 0.4 ms
Lead Channel Setting Sensing Sensitivity: 0.45 mV
Zone Setting Status: 755011

## 2022-09-07 NOTE — Progress Notes (Unsigned)
Enrolled for [employer] to mail a ZIO XT long term holter monitor to the patients address on file.  

## 2022-09-07 NOTE — Progress Notes (Signed)
Patient Care Team: Gildardo Pounds, NP as PCP - General (Nurse Practitioner) Calton Dach, MD as Referring Physician (Optometry) Bensimhon, Shaune Pascal, MD as Consulting Physician (Cardiology) Deboraha Sprang, MD as Consulting Physician (Cardiology)   HPI  Douglas [name] is a 46 y.o. male Seen in followup for ICD implanted 10/14 for primary prevention for NICM  Functional status is improved with less DOE, no edema, snores>>neg sleep study and has lost 70 lbs (at least)  Here today because of device alert.  Related to the ring electrode.  Date Cr K Hgb  4/23   14.4  9/23 1.37 3.9     DATE TEST EF   4/23 Echo   25-30 % LAE severe  4//23 LHC    % Nonobstructive CAD Pulm HTN  Elevated LVEDP              Trying to lose weight  Past Medical History:  Diagnosis Date   Depression, major, single episode 01/14/2017   Diabetes mellitus [facility])    ED (erectile dysfunction)    History of syncope 01/29/2015   Hypertension    ICD (implantable cardioverter-defibrillator) discharge 11/30/2014   On 11/30/14. Asymptomatic.    Nonischemic cardiomyopathy ([place])    a.  echo 4/06: EF 30%, mild to mod MR, mild RAE, inf HK, lat HK , ant HK;    b.  cath 4/06: no CAD, EF 20-25%   NSVT (nonsustained ventricular tachycardia) (HCC)    Obesity    Systolic CHF, chronic ([place])    EF 11/17 25-30%, s/p ICD    Past Surgical History:  Procedure Laterality Date   CARDIAC CATHETERIZATION  09/2011; 02/2013; 04/2013   CARDIAC DEFIBRILLATOR PLACEMENT  08/23/2013   IMPLANTABLE CARDIOVERTER DEFIBRILLATOR IMPLANT N/A 08/23/2013   Procedure: IMPLANTABLE CARDIOVERTER DEFIBRILLATOR IMPLANT;  Surgeon: Deboraha Sprang, MD;  [facility] CATH LAB;  Service: Cardiovascular;  Laterality: N/A;   LEFT AND RIGHT HEART CATHETERIZATION WITH CORONARY ANGIOGRAM N/A 09/20/2011   Procedure: LEFT AND RIGHT HEART CATHETERIZATION WITH CORONARY ANGIOGRAM;  Surgeon: Jolaine Artist, MD;  Location[facility] CATH LAB;  Service:  Cardiovascular;  Laterality: N/A;   MULTIPLE EXTRACTIONS WITH ALVEOLOPLASTY N/A 01/26/2013   Procedure:  EXTRACION  TOOTH # 19 WITH ALVEOLOPLASTY;  Surgeon: Lenn Cal, DDS;  Location: [place];  Service: Oral Surgery;  Laterality: N/A;   RIGHT HEART CATHETERIZATION N/A 02/22/2013   Procedure: RIGHT HEART CATH;  Surgeon: Jolaine Artist, MD;  Location: [facility] CATH LAB;  Service: Cardiovascular;  Laterality: N/A;   RIGHT HEART CATHETERIZATION N/A 05/03/2013   Procedure: RIGHT HEART CATH;  Surgeon: Jolaine Artist, MD;  Location: [facility] CATH LAB;  Service: Cardiovascular;  Laterality: N/A;   RIGHT/LEFT HEART CATH AND CORONARY ANGIOGRAPHY N/A 03/03/2022   Procedure: RIGHT/LEFT HEART CATH AND CORONARY ANGIOGRAPHY;  Surgeon: Jolaine Artist, MD;  Location: [place] CV LAB;  Service: Cardiovascular;  Laterality: N/A;    Current Outpatient Medications  Medication Sig Dispense Refill   Accu-Chek Softclix Lancets lancets Check blood sugar twice daily E11.8 100 each 2   acetaminophen (TYLENOL) 500 MG tablet Take 1 tablet (500 mg total) by mouth every 6 (six) hours as needed. 30 tablet 0   aspirin 81 MG EC tablet Take 1 tablet (81 mg total) by mouth daily. 30 tablet 5   atorvastatin (LIPITOR) 40 MG tablet Take 1 tablet (40 mg total) by mouth daily. 30 tablet 6   Blood Glucose Calibration (ACCU-CHEK GUIDE CONTROL)  LIQD 1 each by In Vitro route once as needed for up to 1 dose. 1 each 0   Blood Glucose Monitoring Suppl (ACCU-CHEK GUIDE ME) w/Device KIT Check blood sugar twice daily E11.8 1 kit 0   carvedilol (COREG) 3.125 MG tablet Take 1 tablet (3.125 mg total) by mouth 2 (two) times daily with a meal. 60 tablet 3   dapagliflozin propanediol (FARXIGA) 10 MG TABS tablet Take 1 tablet (10 mg total) by mouth daily before breakfast. 90 tablet 3   glucose blood (ACCU-CHEK GUIDE) test strip Check blood sugar twice daily (Must have office visit for refills) 100 each 0   insulin glargine (LANTUS SOLOSTAR) 100  UNIT/ML Solostar Pen Inject 36 Units into the skin once daily. 15 mL 1   Insulin Pen Needle (BD PEN NEEDLE NANO U/F) 32G X 4 MM MISC INJECT 1 APPLICATION INTO THE SKIN DAILY. 100 each 1   potassium chloride SA (KLOR-CON M) 20 MEQ tablet Take 1 tablet (20 mEq total) by mouth 2 (two) times daily. 60 tablet 11   sacubitril-valsartan (ENTRESTO) 49-51 MG TAKE 1 TABLET BY MOUTH 2 (TWO) TIMES DAILY. 60 tablet 11   torsemide (DEMADEX) 100 MG tablet Take 1 tablet (100 mg total) by mouth in the morning AND 0.5 tablets (50 mg total) every evening. 45 tablet 6   metolazone (ZAROXOLYN) 2.5 MG tablet Take 1 pill every other Wednesday 8 tablet 1   No current facility-administered medications for this visit.    Allergies  Allergen Reactions   Bydureon [Exenatide] Rash   Penicillins Other (See Comments)    Unknown childhood reaction     Review of Systems negative except from HPI and PMH  Physical Exam BP 108/64   Pulse 82   Ht _0  (1.93 m)   Wt 295 lb 3.2 oz (133.9 kg)   SpO2 94%   BMI 35.93 kg/m  Well developed and well nourished in no acute distress HENT normal Neck supple with JVP-flat Clear Device pocket well healed; without hematoma or erythema.  There is no tethering  Regular rate and rhythm, no  gallop No  murmur Abd-soft with active BS No Clubbing cyanosis  edema Skin-warm and dry A & Oriented  Grossly normal sensory and motor function  ECG Sinus at 82  18/11/43 Axis -23 PVCs-frequent   Assessment and  Plan  NICM  ICD     Obesity   RV lead impedance and threshold change abrupt involving the ring electrode  PVCs   The patient is seen for the first time in more than half a decade.  He comes in today because alarming which were thought to be related to him being written.  There is a change rather abruptly occurring over weeks with impedance and thresholds related to tip to ring configuration but not admitted impedance measures only-coil configuration suggesting that  the electrode has been damaged.  We have reprogrammed his device today to bring for pacing and sensing.  It may be at the time of generator replacement to be appropriate to consider lead replacement in this young man.  I have advised him that there is some potential that the lead damage that we see now could be a more complete lead fracture at which point the replacement would be indicated.  There is a frequent PVC burden both visually as well as on ECG.  We will undertake a 3-day Zio patch to quantitate.  Euvolemic.  Continue on his diuretics.  Blood pressure well controlled, will continue on  his carvedilol and his Delene Loll

## 2022-09-07 NOTE — Patient Instructions (Signed)
Medication Instructions:  Your physician recommends that you continue on your current medications as directed. Please refer to the Current Medication list given to you today.  *If you need a refill on your cardiac medications before your next appointment, please call your pharmacy*   Lab Work: None ordered.  If you have labs (blood work) drawn today and your tests are completely normal, you will receive your results only by: [place] (if you have MyChart) OR A paper copy in the mail If you have any lab test that is abnormal or we need to change your treatment, we will call you to review the results.   Testing/Procedures: [place]- Long Term Monitor Instructions  Your physician has requested you wear a ZIO patch monitor for 3 days.  This is a single patch monitor. [employer] supplies one patch monitor per enrollment. Additional stickers are not available. Please do not apply patch if you will be having a Nuclear Stress Test,  Echocardiogram, Cardiac CT, MRI, or Chest Xray during the period you would be wearing the  monitor. The patch cannot be worn during these tests. You cannot remove and re-apply the  ZIO XT patch monitor.  Your ZIO patch monitor will be mailed 3 day USPS to your address on file. It may take 3-5 days  to receive your monitor after you have been enrolled.  Once you have received your monitor, please review the enclosed instructions. Your monitor  has already been registered assigning a specific monitor serial # to you.  Billing and Patient Assistance Program Information  We have supplied [employer] with any of your insurance information on file for billing purposes. [employer] offers a sliding scale Patient Assistance Program for patients that do not have  insurance, or whose insurance does not completely cover the cost of the ZIO monitor.  You must apply for the Patient Assistance Program to qualify for this discounted rate.  To apply, please call [employer] at  [phone], select option 4, select option 2, ask to apply for  Patient Assistance Program. Theodore Demark will ask your household income, and how many people  are in your household. They will quote your out-of-pocket cost based on that information.  [employer] will also be able to set up a 53-month interest-free payment plan if needed.  Applying the monitor   Shave hair from upper left chest.  Hold abrader disc by orange tab. Rub abrader in 40 strokes over the upper left chest as  indicated in your monitor instructions.  Clean area with 4 enclosed alcohol pads. Let dry.  Apply patch as indicated in monitor instructions. Patch will be placed under collarbone on left  side of chest with arrow pointing upward.  Rub patch adhesive wings for 2 minutes. Remove white label marked "1". Remove the white  label marked "2". Rub patch adhesive wings for 2 additional minutes.  While looking in a mirror, press and release button in center of patch. A small green light will  flash 3-4 times. This will be your only indicator that the monitor has been turned on.  Do not shower for the first 24 hours. You may shower after the first 24 hours.  Press the button if you feel a symptom. You will hear a small click. Record Date, Time and  Symptom in the Patient Logbook.  When you are ready to remove the patch, follow instructions on the last 2 pages of Patient  Logbook. Stick patch monitor onto the last page of Patient Logbook.  Place Patient  Logbook in the blue and white box. Use locking tab on box and tape box closed  securely. The blue and white box has prepaid postage on it. Please place it in the mailbox as  soon as possible. Your physician should have your test results approximately 7 days after the  monitor has been mailed back to [employer].  Call [employer] Customer Care at [phone] if you have questions regarding  your ZIO XT patch monitor. Call them immediately if you see an orange light  blinking on your  monitor.  If your monitor falls off in less than 4 days, contact our Monitor department at [phone].  If your monitor becomes loose or falls off after 4 days call [employer] at [phone] for  suggestions on securing your monitor    Follow-Up: At [facility], you and your health needs are our priority.  As part of our continuing mission to provide you with exceptional heart care, we have created designated Provider Care Teams.  These Care Teams include your primary Cardiologist (physician) and Advanced Practice Providers (APPs -  Physician Assistants and Nurse Practitioners) who all work together to provide you with the care you need, when you need it.  We recommend signing up for the patient portal called "MyChart".  Sign up information is provided on this After Visit Summary.  MyChart is used to connect with patients for Virtual Visits (Telemedicine).  Patients are able to view lab/test results, encounter notes, upcoming appointments, etc.  Non-urgent messages can be sent to your provider as well.   To learn more about what you can do with MyChart, go to [email or web address].    Your next appointment:   12 months with Dr Klein  Important Information About Sugar       

## 2022-09-08 ENCOUNTER — Telehealth: Payer: Self-pay

## 2022-09-08 NOTE — Telephone Encounter (Signed)
The patient states he came in yesterday to see Caryl Comes to get his alarm off but it is still alarming. I told him the nurse will give him a call back.

## 2022-09-08 NOTE — Telephone Encounter (Signed)
Pt called to report his alarm for his device has continued since office visit yesterday.  Went to MDT website and reset RV impedance alert.  Pt then sent manual transmission which per website will expedite change.  Advised Pt if he noticed that the alarm continues call device office and will have him come in to have that turned off.  Pt indicates understanding.

## 2022-09-09 ENCOUNTER — Ambulatory Visit: Payer: Medicaid Other | Attending: Internal Medicine

## 2022-09-09 DIAGNOSIS — I472 Ventricular tachycardia, unspecified: Secondary | ICD-10-CM

## 2022-09-09 LAB — CUP PACEART INCLINIC DEVICE CHECK
Battery Remaining Longevity: 24 mo
Battery Voltage: 2.85 V
Brady Statistic RV Percent Paced: 0.09 %
Date Time Interrogation Session: 20231026135037
HighPow Impedance: 53 Ohm
HighPow Impedance: 75 Ohm
Implantable Lead Connection Status: 753985
Implantable Lead Implant Date: 20141009
Implantable Lead Location: 753860
Implantable Lead Model: 7121
Implantable Pulse Generator Implant Date: 20141009
Lead Channel Impedance Value: 1064 Ohm
Lead Channel Impedance Value: 323 Ohm
Lead Channel Pacing Threshold Amplitude: 0.625 V
Lead Channel Pacing Threshold Pulse Width: 0.4 ms
Lead Channel Sensing Intrinsic Amplitude: 11.875 mV
Lead Channel Sensing Intrinsic Amplitude: 15.75 mV
Lead Channel Setting Pacing Amplitude: 3 V
Lead Channel Setting Pacing Pulse Width: 0.4 ms
Lead Channel Setting Sensing Sensitivity: 0.45 mV
Zone Setting Status: 755011

## 2022-09-09 NOTE — Progress Notes (Signed)
Remote ICD transmission.   

## 2022-09-09 NOTE — Progress Notes (Signed)
Patient seen in device clinic for RV Bipolar lead impedance alarming. Verbal orders obtained from Dr. Caryl Comes to turn off alarm for Bipolar lead impedance. Other changes made on ICD  by MDT rep Del. See attachment report for changes.

## 2022-09-09 NOTE — Telephone Encounter (Signed)
Pt left a voicemail because wanting the nurse to give him a call about his ICD alarming.

## 2022-09-09 NOTE — Telephone Encounter (Signed)
Device clinic apt made 09/09/22 @ 1:00 pm

## 2022-09-21 ENCOUNTER — Other Ambulatory Visit (HOSPITAL_COMMUNITY): Payer: Self-pay | Admitting: Family Medicine

## 2022-09-22 ENCOUNTER — Other Ambulatory Visit: Payer: Self-pay

## 2022-09-22 MED ORDER — CARVEDILOL 3.125 MG PO TABS
3.1250 mg | ORAL_TABLET | Freq: Two times a day (BID) | ORAL | 3 refills | Status: DC
  Filled 2022-09-22: qty 60, 30d supply, fill #0

## 2022-09-23 ENCOUNTER — Other Ambulatory Visit: Payer: Self-pay

## 2022-09-24 ENCOUNTER — Other Ambulatory Visit: Payer: Self-pay

## 2022-10-06 ENCOUNTER — Ambulatory Visit: Payer: Medicaid Other | Attending: Nurse Practitioner | Admitting: Nurse Practitioner

## 2022-10-06 ENCOUNTER — Encounter: Payer: Self-pay | Admitting: Nurse Practitioner

## 2022-10-06 VITALS — BP 97/67 | HR 71 | Temp 98.1°F | Ht 76.0 in | Wt 291.0 lb

## 2022-10-06 DIAGNOSIS — I10 Essential (primary) hypertension: Secondary | ICD-10-CM | POA: Diagnosis not present

## 2022-10-06 DIAGNOSIS — K429 Umbilical hernia without obstruction or gangrene: Secondary | ICD-10-CM | POA: Diagnosis not present

## 2022-10-06 DIAGNOSIS — R7989 Other specified abnormal findings of blood chemistry: Secondary | ICD-10-CM | POA: Diagnosis not present

## 2022-10-06 DIAGNOSIS — Z794 Long term (current) use of insulin: Secondary | ICD-10-CM | POA: Diagnosis not present

## 2022-10-06 DIAGNOSIS — Z23 Encounter for immunization: Secondary | ICD-10-CM

## 2022-10-06 DIAGNOSIS — E7800 Pure hypercholesterolemia, unspecified: Secondary | ICD-10-CM

## 2022-10-06 DIAGNOSIS — E119 Type 2 diabetes mellitus without complications: Secondary | ICD-10-CM

## 2022-10-06 LAB — POCT GLYCOSYLATED HEMOGLOBIN (HGB A1C): HbA1c, POC (controlled diabetic range): 6.3 % (ref 0.0–7.0)

## 2022-10-06 NOTE — Progress Notes (Signed)
Assessment & Plan:  Douglas [name] was seen today for diabetes.  Diagnoses and all orders for this visit:  Type 2 diabetes mellitus without complication, with long-term current use of insulin (HCC) -     POCT glycosylated hemoglobin (Hb A1C) -     Microalbumin / creatinine urine ratio Continue blood sugar control as discussed in office today, low carbohydrate diet, and regular physical exercise as tolerated, 150 minutes per week (30 min each day, 5 days per week, or 50 min 3 days per week). Keep blood sugar logs with fasting goal of 90-130 mg/dl, post prandial (after you eat) less than 180.  For Hypoglycemia: BS <60 and Hyperglycemia BS >400; contact the clinic ASAP. Annual eye exams and foot exams are recommended.   Essential hypertension Continue all antihypertensives as prescribed.  Reminded to bring in blood pressure log for follow  up appointment.  RECOMMENDATIONS: DASH/Mediterranean Diets are healthier choices for HTN.    Pure hypercholesterolemia -     Lipid panel INSTRUCTIONS: Work on a low fat, heart healthy diet and participate in regular aerobic exercise program by working out at least 150 minutes per week; 5 days a week-30 minutes per day. Avoid red meat/beef/steak,  fried foods. junk foods, sodas, sugary drinks, unhealthy snacking, alcohol and smoking.  Drink at least 80 oz of water per day and monitor your carbohydrate intake daily.    Abnormal CBC -     CBC with Differential  Need for immunization against influenza -     Flu Vaccine QUAD 13moIM (Fluarix, Fluzone & Alfiuria Quad PF)    Patient has been counseled on age-appropriate routine health concerns for screening and prevention. These are reviewed and up-to-date. Referrals have been placed accordingly. Immunizations are up-to-date or declined.    Subjective:   Chief Complaint  Patient presents with   Diabetes   HPI Douglas L [id number].o. male presents to office today for follow up to DM  He has a past medical  history of Depression, major, single episode (01/14/2017), CAD, HPL, history of substance abuse, DM2, ED, History of syncope (01/29/2015), Hypertension, ICD discharge (11/30/2014), Nonischemic cardiomyopathy, NSVT, Obesity, and Acute on chronic Systolic CHF    Being followed by Cardiology   Started smoking 15 years ago. Smokes about 5 cigarettes a day. The most he has smoked was half a pack of cigarettes per day.   Notes palpable pain in area of Umbilical hernia. Onset 8 months ago. He has been losing weight so hernia likely has been there longer but is more noticeable with weight loss.    DM2 Diabetes is well controlled with Lantus 36 units daily, Farxiga 10 mg daily.  LDL not quite at goal with atorvastatin 40 mg daily Lab Results  Component Value Date   HGBA1C 6.3 10/06/2022    Lab Results  Component Value Date   LDLCALC 133 (H) 03/01/2022     HTN Blood pressure is well controlled with carvedilol 3.125 mg twice daily and Entresto 49-51 mg twice daily. He also takes torsemide and Zaroxolyn. BP Readings from Last 3 Encounters:  10/06/22 97/67  09/07/22 108/64  07/26/22 97/73     Review of Systems  Constitutional:  Negative for fever, malaise/fatigue and weight loss.  HENT: Negative.  Negative for nosebleeds.   Eyes: Negative.  Negative for blurred vision, double vision and photophobia.  Respiratory: Negative.  Negative for cough and shortness of breath.   Cardiovascular: Negative.  Negative for chest pain, palpitations and leg swelling.  Gastrointestinal:  Negative.  Negative for heartburn, nausea and vomiting.  Musculoskeletal: Negative.  Negative for myalgias.  Neurological: Negative.  Negative for dizziness, focal weakness, seizures and headaches.  Psychiatric/Behavioral: Negative.  Negative for suicidal ideas.     Past Medical History:  Diagnosis Date   Depression, major, single episode 01/14/2017   Diabetes mellitus [facility])    ED (erectile dysfunction)    History of syncope  01/29/2015   Hypertension    ICD (implantable cardioverter-defibrillator) discharge 11/30/2014   On 11/30/14. Asymptomatic.    Nonischemic cardiomyopathy ([place])    a.  echo 4/06: EF 30%, mild to mod MR, mild RAE, inf HK, lat HK , ant HK;    b.  cath 4/06: no CAD, EF 20-25%   NSVT (nonsustained ventricular tachycardia) (HCC)    Obesity    Systolic CHF, chronic ([place])    EF 11/17 25-30%, s/p ICD    Past Surgical History:  Procedure Laterality Date   CARDIAC CATHETERIZATION  09/2011; 02/2013; 04/2013   CARDIAC DEFIBRILLATOR PLACEMENT  08/23/2013   IMPLANTABLE CARDIOVERTER DEFIBRILLATOR IMPLANT N/A 08/23/2013   Procedure: IMPLANTABLE CARDIOVERTER DEFIBRILLATOR IMPLANT;  Surgeon: Deboraha Sprang, MD;  Location: [facility] CATH LAB;  Service: Cardiovascular;  Laterality: N/A;   LEFT AND RIGHT HEART CATHETERIZATION WITH CORONARY ANGIOGRAM N/A 09/20/2011   Procedure: LEFT AND RIGHT HEART CATHETERIZATION WITH CORONARY ANGIOGRAM;  Surgeon: Jolaine Artist, MD;  Location[facility] CATH LAB;  Service: Cardiovascular;  Laterality: N/A;   MULTIPLE EXTRACTIONS WITH ALVEOLOPLASTY N/A 01/26/2013   Procedure:  EXTRACION  TOOTH # 19 WITH ALVEOLOPLASTY;  Surgeon: Lenn Cal, DDS;  Location: [place];  Service: Oral Surgery;  Laterality: N/A;   RIGHT HEART CATHETERIZATION N/A 02/22/2013   Procedure: RIGHT HEART CATH;  Surgeon: Jolaine Artist, MD;  Location: [facility] CATH LAB;  Service: Cardiovascular;  Laterality: N/A;   RIGHT HEART CATHETERIZATION N/A 05/03/2013   Procedure: RIGHT HEART CATH;  Surgeon: Jolaine Artist, MD;  Location: [facility] CATH LAB;  Service: Cardiovascular;  Laterality: N/A;   RIGHT/LEFT HEART CATH AND CORONARY ANGIOGRAPHY N/A 03/03/2022   Procedure: RIGHT/LEFT HEART CATH AND CORONARY ANGIOGRAPHY;  Surgeon: Jolaine Artist, MD;  Location: [place] CV LAB;  Service: Cardiovascular;  Laterality: N/A;    Family History  Problem Relation Age of Onset   Coronary artery disease Mother 22       s/p PCI    Lung cancer Father    Diabetes type II Maternal Uncle    Coronary artery disease Maternal Uncle    Stroke Neg Hx    Heart attack Neg Hx     Social History Reviewed with no changes to be made today.   Outpatient Medications Prior to Visit  Medication Sig Dispense Refill   Accu-Chek Softclix Lancets lancets Check blood sugar twice daily E11.8 100 each 2   acetaminophen (TYLENOL) 500 MG tablet Take 1 tablet (500 mg total) by mouth every 6 (six) hours as needed. 30 tablet 0   aspirin 81 MG EC tablet Take 1 tablet (81 mg total) by mouth daily. 30 tablet 5   atorvastatin (LIPITOR) 40 MG tablet Take 1 tablet (40 mg total) by mouth daily. 30 tablet 6   Blood Glucose Calibration (ACCU-CHEK GUIDE CONTROL) LIQD 1 each by In Vitro route once as needed for up to 1 dose. 1 each 0   Blood Glucose Monitoring Suppl (ACCU-CHEK GUIDE ME) w/Device KIT Check blood sugar twice daily E11.8 1 kit 0   carvedilol (COREG) 3.125 MG tablet Take  1 tablet (3.125 mg total) by mouth 2 (two) times daily with a meal. 60 tablet 3   dapagliflozin propanediol (FARXIGA) 10 MG TABS tablet Take 1 tablet (10 mg total) by mouth daily before breakfast. 90 tablet 3   glucose blood (ACCU-CHEK GUIDE) test strip Check blood sugar twice daily (Must have office visit for refills) 100 each 0   insulin glargine (LANTUS SOLOSTAR) 100 UNIT/ML Solostar Pen Inject 36 Units into the skin once daily. 15 mL 1   Insulin Pen Needle (BD PEN NEEDLE NANO U/F) 32G X 4 MM MISC INJECT 1 APPLICATION INTO THE SKIN DAILY. 100 each 1   metolazone (ZAROXOLYN) 2.5 MG tablet Take 1 pill every other Wednesday 8 tablet 1   potassium chloride SA (KLOR-CON M) 20 MEQ tablet Take 1 tablet (20 mEq total) by mouth 2 (two) times daily. 60 tablet 11   sacubitril-valsartan (ENTRESTO) 49-51 MG TAKE 1 TABLET BY MOUTH 2 (TWO) TIMES DAILY. 60 tablet 11   torsemide (DEMADEX) 100 MG tablet Take 1 tablet (100 mg total) by mouth in the morning AND 0.5 tablets (50 mg total) every  evening. 45 tablet 6   No facility-administered medications prior to visit.    Allergies  Allergen Reactions   Bydureon [Exenatide] Rash   Penicillins Other (See Comments)    Unknown childhood reaction        Objective:    BP 97/67   Pulse 71   Temp 98.1 F (36.7 C) (Temporal)   Ht _0  (1.93 m)   Wt 291 lb (132 kg)   SpO2 98%   BMI 35.42 kg/m  Wt Readings from Last 3 Encounters:  10/06/22 291 lb (132 kg)  09/07/22 295 lb 3.2 oz (133.9 kg)  07/26/22 290 lb (131.5 kg)    Physical Exam Vitals and nursing note reviewed.  Constitutional:      Appearance: He is well-developed.  HENT:     Head: Normocephalic and atraumatic.  Cardiovascular:     Rate and Rhythm: Normal rate and regular rhythm.     Heart sounds: Normal heart sounds. No murmur heard.    No friction rub. No gallop.  Pulmonary:     Effort: Pulmonary effort is normal. No tachypnea or respiratory distress.     Breath sounds: Normal breath sounds. No decreased breath sounds, wheezing, rhonchi or rales.  Chest:     Chest wall: No tenderness.  Abdominal:     General: Bowel sounds are normal.     Palpations: Abdomen is soft.  Musculoskeletal:        General: Normal range of motion.     Cervical back: Normal range of motion.  Skin:    General: Skin is warm and dry.  Neurological:     Mental Status: He is alert and oriented to person, place, and time.     Coordination: Coordination normal.  Psychiatric:        Behavior: Behavior normal. Behavior is cooperative.        Thought Content: Thought content normal.        Judgment: Judgment normal.          Patient has been counseled extensively about nutrition and exercise as well as the importance of adherence with medications and regular follow-up. The patient was given clear instructions to go to ER or return to medical center if symptoms don't improve, worsen or new problems develop. The patient verbalized understanding.   Follow-up: Return in about 3  months (around 01/06/2023).   Sahith Nurse  Leanne Chang, FNP-BC [facility] and [facility] [place], [place]   10/06/2022, 6:24 PM

## 2022-10-08 LAB — CBC WITH DIFFERENTIAL/PLATELET
Basophils Absolute: 0 10*3/uL (ref 0.0–0.2)
Basos: 1 %
EOS (ABSOLUTE): 0.3 10*3/uL (ref 0.0–0.4)
Eos: 5 %
Hematocrit: 51.7 % — ABNORMAL HIGH (ref 37.5–51.0)
Hemoglobin: 17.2 g/dL (ref 13.0–17.7)
Immature Grans (Abs): 0 10*3/uL (ref 0.0–0.1)
Immature Granulocytes: 0 %
Lymphocytes Absolute: 2.7 10*3/uL (ref 0.7–3.1)
Lymphs: 41 %
MCH: 28.8 pg (ref 26.6–33.0)
MCHC: 33.3 g/dL (ref 31.5–35.7)
MCV: 87 fL (ref 79–97)
Monocytes Absolute: 0.9 10*3/uL (ref 0.1–0.9)
Monocytes: 14 %
Neutrophils Absolute: 2.5 10*3/uL (ref 1.4–7.0)
Neutrophils: 39 %
Platelets: 227 10*3/uL (ref 150–450)
RBC: 5.98 x10E6/uL — ABNORMAL HIGH (ref 4.14–5.80)
RDW: 12.9 % (ref 11.6–15.4)
WBC: 6.4 10*3/uL (ref 3.4–10.8)

## 2022-10-08 LAB — LIPID PANEL
Chol/HDL Ratio: 3.8 ratio (ref 0.0–5.0)
Cholesterol, Total: 192 mg/dL (ref 100–199)
HDL: 50 mg/dL (ref >39)
LDL Chol Calc (NIH): 124 mg/dL — ABNORMAL HIGH (ref 0–99)
Triglycerides: 97 mg/dL (ref 0–149)
VLDL Cholesterol Cal: 18 mg/dL (ref 5–40)

## 2022-10-08 LAB — MICROALBUMIN / CREATININE URINE RATIO
Creatinine, Urine: 27.2 mg/dL
Microalb/Creat Ratio: 2231 mg/g creat — ABNORMAL HIGH (ref 0–29)
Microalbumin, Urine: 606.8 ug/mL

## 2022-10-11 ENCOUNTER — Other Ambulatory Visit: Payer: Self-pay

## 2022-10-11 NOTE — Progress Notes (Signed)
Scheduled

## 2022-10-18 ENCOUNTER — Inpatient Hospital Stay (HOSPITAL_COMMUNITY)
Admission: EM | Admit: 2022-10-18 | Discharge: 2022-10-21 | DRG: 291 | Disposition: A | Discharge status: Home / Self Care | Payer: Medicaid Other | Attending: Internal Medicine | Admitting: Internal Medicine

## 2022-10-18 ENCOUNTER — Other Ambulatory Visit: Payer: Self-pay

## 2022-10-18 ENCOUNTER — Ambulatory Visit (HOSPITAL_COMMUNITY): Payer: Medicaid Other

## 2022-10-18 ENCOUNTER — Emergency Department (HOSPITAL_COMMUNITY): Payer: Medicaid Other

## 2022-10-18 ENCOUNTER — Encounter (HOSPITAL_COMMUNITY): Payer: Self-pay | Admitting: Emergency Medicine

## 2022-10-18 DIAGNOSIS — R079 Chest pain, unspecified: Secondary | ICD-10-CM | POA: Diagnosis not present

## 2022-10-18 DIAGNOSIS — R059 Cough, unspecified: Secondary | ICD-10-CM | POA: Diagnosis not present

## 2022-10-18 DIAGNOSIS — I130 Hypertensive heart and chronic kidney disease with heart failure and stage 1 through stage 4 chronic kidney disease, or unspecified chronic kidney disease: Principal | ICD-10-CM | POA: Diagnosis present

## 2022-10-18 DIAGNOSIS — Z95810 Presence of automatic (implantable) cardiac defibrillator: Secondary | ICD-10-CM

## 2022-10-18 DIAGNOSIS — I2510 Atherosclerotic heart disease of native coronary artery without angina pectoris: Secondary | ICD-10-CM | POA: Diagnosis not present

## 2022-10-18 DIAGNOSIS — Z7982 Long term (current) use of aspirin: Secondary | ICD-10-CM

## 2022-10-18 DIAGNOSIS — E669 Obesity, unspecified: Secondary | ICD-10-CM | POA: Diagnosis present

## 2022-10-18 DIAGNOSIS — E785 Hyperlipidemia, unspecified: Secondary | ICD-10-CM | POA: Diagnosis present

## 2022-10-18 DIAGNOSIS — I110 Hypertensive heart disease with heart failure: Secondary | ICD-10-CM | POA: Diagnosis not present

## 2022-10-18 DIAGNOSIS — N182 Chronic kidney disease, stage 2 (mild): Secondary | ICD-10-CM | POA: Diagnosis not present

## 2022-10-18 DIAGNOSIS — E1122 Type 2 diabetes mellitus with diabetic chronic kidney disease: Secondary | ICD-10-CM

## 2022-10-18 DIAGNOSIS — F17210 Nicotine dependence, cigarettes, uncomplicated: Secondary | ICD-10-CM | POA: Diagnosis present

## 2022-10-18 DIAGNOSIS — N179 Acute kidney failure, unspecified: Secondary | ICD-10-CM

## 2022-10-18 DIAGNOSIS — I509 Heart failure, unspecified: Secondary | ICD-10-CM

## 2022-10-18 DIAGNOSIS — I5023 Acute on chronic systolic (congestive) heart failure: Secondary | ICD-10-CM

## 2022-10-18 DIAGNOSIS — F1410 Cocaine abuse, uncomplicated: Secondary | ICD-10-CM | POA: Diagnosis present

## 2022-10-18 DIAGNOSIS — Z888 Allergy status to other drugs, medicaments and biological substances status: Secondary | ICD-10-CM

## 2022-10-18 DIAGNOSIS — Z8249 Family history of ischemic heart disease and other diseases of the circulatory system: Secondary | ICD-10-CM

## 2022-10-18 DIAGNOSIS — Z794 Long term (current) use of insulin: Secondary | ICD-10-CM

## 2022-10-18 DIAGNOSIS — Z880 Allergy status to penicillin: Secondary | ICD-10-CM

## 2022-10-18 DIAGNOSIS — Z833 Family history of diabetes mellitus: Secondary | ICD-10-CM

## 2022-10-18 DIAGNOSIS — I2489 Other forms of acute ischemic heart disease: Secondary | ICD-10-CM | POA: Diagnosis present

## 2022-10-18 DIAGNOSIS — R0602 Shortness of breath: Secondary | ICD-10-CM | POA: Diagnosis not present

## 2022-10-18 DIAGNOSIS — I428 Other cardiomyopathies: Secondary | ICD-10-CM | POA: Diagnosis present

## 2022-10-18 DIAGNOSIS — I10 Essential (primary) hypertension: Secondary | ICD-10-CM

## 2022-10-18 DIAGNOSIS — Z6835 Body mass index (BMI) 35.0-35.9, adult: Secondary | ICD-10-CM

## 2022-10-18 DIAGNOSIS — Z79899 Other long term (current) drug therapy: Secondary | ICD-10-CM

## 2022-10-18 DIAGNOSIS — R7989 Other specified abnormal findings of blood chemistry: Secondary | ICD-10-CM

## 2022-10-18 DIAGNOSIS — N1831 Chronic kidney disease, stage 3a: Secondary | ICD-10-CM | POA: Diagnosis present

## 2022-10-18 LAB — CBC WITH DIFFERENTIAL/PLATELET
Abs Immature Granulocytes: 0.06 10*3/uL (ref 0.00–0.07)
Basophils Absolute: 0 10*3/uL (ref 0.0–0.1)
Basophils Relative: 0 %
Eosinophils Absolute: 0.1 10*3/uL (ref 0.0–0.5)
Eosinophils Relative: 2 %
HCT: 48.6 % (ref 39.0–52.0)
Hemoglobin: 16.1 g/dL (ref 13.0–17.0)
Immature Granulocytes: 1 %
Lymphocytes Relative: 24 %
Lymphs Abs: 1.7 10*3/uL (ref 0.7–4.0)
MCH: 29.2 pg (ref 26.0–34.0)
MCHC: 33.1 g/dL (ref 30.0–36.0)
MCV: 88 fL (ref 80.0–100.0)
Monocytes Absolute: 0.9 10*3/uL (ref 0.1–1.0)
Monocytes Relative: 13 %
Neutro Abs: 4.1 10*3/uL (ref 1.7–7.7)
Neutrophils Relative %: 60 %
Platelets: 199 10*3/uL (ref 150–400)
RBC: 5.52 MIL/uL (ref 4.22–5.81)
RDW: 13.2 % (ref 11.5–15.5)
WBC: 7 10*3/uL (ref 4.0–10.5)
nRBC: 0 % (ref 0.0–0.2)

## 2022-10-18 LAB — BASIC METABOLIC PANEL
Anion gap: 10 (ref 5–15)
BUN: 15 mg/dL (ref 6–20)
CO2: 24 mmol/L (ref 22–32)
Calcium: 8.5 mg/dL — ABNORMAL LOW (ref 8.9–10.3)
Chloride: 103 mmol/L (ref 98–111)
Creatinine, Ser: 1.94 mg/dL — ABNORMAL HIGH (ref 0.61–1.24)
GFR, Estimated: 42 mL/min — ABNORMAL LOW (ref >60)
Glucose, Bld: 179 mg/dL — ABNORMAL HIGH (ref 70–99)
Potassium: 4 mmol/L (ref 3.5–5.1)
Sodium: 137 mmol/L (ref 135–145)

## 2022-10-18 LAB — TROPONIN I (HIGH SENSITIVITY)
Troponin I (High Sensitivity): 124 ng/L (ref <18)
Troponin I (High Sensitivity): 244 ng/L (ref <18)

## 2022-10-18 LAB — CBG MONITORING, ED: Glucose-Capillary: 108 mg/dL — ABNORMAL HIGH (ref 70–99)

## 2022-10-18 LAB — BRAIN NATRIURETIC PEPTIDE: B Natriuretic Peptide: 1494.7 pg/mL — ABNORMAL HIGH (ref 0.0–100.0)

## 2022-10-18 MED ORDER — ACETAMINOPHEN 325 MG PO TABS: 650.0000 mg | ORAL_TABLET | Freq: Four times a day (QID) | ORAL | Status: DC | PRN

## 2022-10-18 MED ORDER — ASPIRIN 81 MG PO TBEC
81.0000 mg | DELAYED_RELEASE_TABLET | Freq: Every day | ORAL | Status: DC
  Administered 2022-10-19 – 2022-10-21 (×3): 81 mg via ORAL
  Filled 2022-10-18 (×3): qty 1

## 2022-10-18 MED ORDER — INSULIN GLARGINE-YFGN 100 UNIT/ML ~~LOC~~ SOLN
10.0000 [IU] | Freq: Every day | SUBCUTANEOUS | Status: DC
  Administered 2022-10-19 – 2022-10-21 (×3): 10 [IU] via SUBCUTANEOUS
  Filled 2022-10-18 (×3): qty 0.1

## 2022-10-18 MED ORDER — ENOXAPARIN SODIUM 40 MG/0.4ML IJ SOSY
40.0000 mg | PREFILLED_SYRINGE | INTRAMUSCULAR | Status: DC
  Administered 2022-10-18 – 2022-10-20 (×3): 40 mg via SUBCUTANEOUS
  Filled 2022-10-18 (×3): qty 0.4

## 2022-10-18 MED ORDER — SENNOSIDES-DOCUSATE SODIUM 8.6-50 MG PO TABS: 1.0000 | ORAL_TABLET | Freq: Every evening | ORAL | Status: DC | PRN

## 2022-10-18 MED ORDER — ACETAMINOPHEN 650 MG RE SUPP: 650.0000 mg | Freq: Four times a day (QID) | RECTAL | Status: DC | PRN

## 2022-10-18 MED ORDER — INSULIN ASPART 100 UNIT/ML IJ SOLN
0.0000 [IU] | Freq: Three times a day (TID) | INTRAMUSCULAR | Status: DC
  Administered 2022-10-19 – 2022-10-20 (×2): 1 [IU] via SUBCUTANEOUS

## 2022-10-18 MED ORDER — INSULIN ASPART 100 UNIT/ML IJ SOLN: 0.0000 [IU] | Freq: Every day | INTRAMUSCULAR | Status: DC

## 2022-10-18 MED ORDER — CARVEDILOL 3.125 MG PO TABS
3.1250 mg | ORAL_TABLET | Freq: Two times a day (BID) | ORAL | Status: DC
  Administered 2022-10-19 – 2022-10-21 (×4): 3.125 mg via ORAL
  Filled 2022-10-18 (×5): qty 1

## 2022-10-18 MED ORDER — SODIUM CHLORIDE 0.9% FLUSH
3.0000 mL | Freq: Two times a day (BID) | INTRAVENOUS | Status: DC
  Administered 2022-10-18 – 2022-10-21 (×6): 3 mL via INTRAVENOUS

## 2022-10-18 MED ORDER — ATORVASTATIN CALCIUM 40 MG PO TABS
40.0000 mg | ORAL_TABLET | Freq: Every day | ORAL | Status: DC
  Administered 2022-10-19 – 2022-10-21 (×3): 40 mg via ORAL
  Filled 2022-10-18 (×3): qty 1

## 2022-10-18 MED ORDER — FUROSEMIDE 10 MG/ML IJ SOLN
80.0000 mg | Freq: Two times a day (BID) | INTRAMUSCULAR | Status: DC
  Administered 2022-10-19 – 2022-10-20 (×3): 80 mg via INTRAVENOUS
  Filled 2022-10-18 (×3): qty 8

## 2022-10-18 MED ORDER — FUROSEMIDE 10 MG/ML IJ SOLN
80.0000 mg | Freq: Once | INTRAMUSCULAR | Status: AC
  Administered 2022-10-18: 80 mg via INTRAVENOUS
  Filled 2022-10-18: qty 8

## 2022-10-18 NOTE — ED Provider Triage Note (Signed)
Emergency Medicine Provider Triage Evaluation Note  Douglas [name] , a 46 y.o. male  was evaluated in triage.  Pt complains of shortness of breath. Symptoms began last night. States he has had decreased urine output than usual on his lasix. States yesterday he took metolazine along with his lasix and seemed to have increased output, but remains short of breath. Has chronic cough without changes. Denies fever. Denies chest pain or palpitations. .  Review of Systems  Positive: See above Negative:   Physical Exam  Ht 6\' 4"  (1.93 m)   Wt 131.5 kg   BMI 35.30 kg/m  Gen:   Awake, no distress   Resp:  Normal effort  MSK:   Moves extremities without difficulty Other:  Crackles in BLL. BLE edema.   Medical Decision Making  Medically screening exam initiated at 6:13 PM.  Appropriate orders placed.  Savannah L Micciche was informed that the remainder of the evaluation will be completed by another provider, this initial triage assessment does not replace that evaluation, and the importance of remaining in the ED until their evaluation is complete.     Luisa Hart, PA-C 10/18/22 [phone]

## 2022-10-18 NOTE — ED Triage Notes (Signed)
Pt reports SOB and CP when coughing for about 12 hours ago. Pt has noticed any leg swelling. Hx of CHF.

## 2022-10-18 NOTE — ED Provider Notes (Signed)
[facility] EMERGENCY DEPARTMENT Provider Note   CSN: [id number] Arrival date & time: 10/18/22  1751     History  Chief Complaint  Patient presents with   Shortness of Breath    Douglas [name] is a 47 y.o. male.   Shortness of Breath The patient is a 46 year old male past medical history of CAD, T2DM, HTN, ICD, nonischemic cardiomyopathy, systolic CHF who is presenting for evaluation of shortness of breath.  The patient states that he developed shortness of breath earlier today which is worse on exertion.  He had associated chest pain that only occurred while coughing.  He denies recent fevers, nausea, vomiting, abdominal pain, diarrhea, or urinary symptoms.  He states that he has been taking his torsemide daily and his metolazone once every other Monday as prescribed.  He is also reporting mild worsening of his chronic lower extremity edema.     Home Medications Prior to Admission medications   Medication Sig Start Date End Date Taking? Authorizing Provider  Accu-Chek Softclix Lancets lancets Check blood sugar twice daily E11.8 04/03/21   Charlott Rakes, MD  acetaminophen (TYLENOL) 500 MG tablet Take 1 tablet (500 mg total) by mouth every 6 (six) hours as needed. 04/29/21   Gildardo Pounds, NP  aspirin 81 MG EC tablet Take 1 tablet (81 mg total) by mouth daily. 03/05/22   Lyda Jester M, PA-C  atorvastatin (LIPITOR) 40 MG tablet Take 1 tablet (40 mg total) by mouth daily. 03/05/22 03/05/23  Lyda Jester M, PA-C  Blood Glucose Calibration (ACCU-CHEK GUIDE CONTROL) LIQD 1 each by In Vitro route once as needed for up to 1 dose. 03/30/19   Gildardo Pounds, NP  Blood Glucose Monitoring Suppl (ACCU-CHEK GUIDE ME) w/Device KIT Check blood sugar twice daily E11.8 04/03/21   Charlott Rakes, MD  carvedilol (COREG) 3.125 MG tablet Take 1 tablet (3.125 mg total) by mouth 2 (two) times daily with a meal. 09/22/22   Milford, Maricela Bo, FNP  dapagliflozin propanediol  (FARXIGA) 10 MG TABS tablet Take 1 tablet (10 mg total) by mouth daily before breakfast. 06/02/22 06/02/23  Milford, Maricela Bo, FNP  glucose blood (ACCU-CHEK GUIDE) test strip Check blood sugar twice daily (Must have office visit for refills) 08/09/22   Gildardo Pounds, NP  insulin glargine (LANTUS SOLOSTAR) 100 UNIT/ML Solostar Pen Inject 36 Units into the skin once daily. 08/09/22   Gildardo Pounds, NP  Insulin Pen Needle (BD PEN NEEDLE NANO U/F) 32G X 4 MM MISC INJECT 1 APPLICATION INTO THE SKIN DAILY. 08/24/22   Charlott Rakes, MD  metolazone (ZAROXOLYN) 2.5 MG tablet Take 1 pill every other Wednesday 06/02/22 12/17/22  Rafael Bihari, FNP  potassium chloride SA (KLOR-CON M) 20 MEQ tablet Take 1 tablet (20 mEq total) by mouth 2 (two) times daily. 06/21/22   Larey Dresser, MD  sacubitril-valsartan (ENTRESTO) 49-51 MG TAKE 1 TABLET BY MOUTH 2 (TWO) TIMES DAILY. 03/05/22 03/05/23  Lyda Jester M, PA-C  torsemide (DEMADEX) 100 MG tablet Take 1 tablet (100 mg total) by mouth in the morning AND 0.5 tablets (50 mg total) every evening. 04/28/22   Milford, Maricela Bo, FNP      Allergies    Bydureon [exenatide] and Penicillins    Review of Systems   Review of Systems  Respiratory:  Positive for shortness of breath.    See HPI Physical Exam Updated Vital Signs BP 106/79   Pulse 96   Temp 98 F (36.7 C)  Resp (!) 22   Ht _0  (1.93 m)   Wt 131.5 kg   SpO2 93%   BMI 35.30 kg/m  Physical Exam Vitals and nursing note reviewed.  Constitutional:      General: He is not in acute distress.    Appearance: He is well-developed. He is obese.  HENT:     Head: Normocephalic and atraumatic.  Eyes:     Conjunctiva/sclera: Conjunctivae normal.  Cardiovascular:     Rate and Rhythm: Normal rate and regular rhythm.     Heart sounds: Murmur heard.  Pulmonary:     Effort: Pulmonary effort is normal. No respiratory distress.     Breath sounds: Normal breath sounds.  Abdominal:     Palpations:  Abdomen is soft.     Tenderness: There is no abdominal tenderness.  Musculoskeletal:        General: No swelling.     Cervical back: Neck supple.     Right lower leg: Edema present.     Left lower leg: Edema present.  Skin:    General: Skin is warm and dry.     Capillary Refill: Capillary refill takes less than 2 seconds.  Neurological:     General: No focal deficit present.     Mental Status: He is alert and oriented to person, place, and time.  Psychiatric:        Mood and Affect: Mood normal.     ED Results / Procedures / Treatments   Labs (all labs ordered are listed, but only abnormal results are displayed) Labs Reviewed  BASIC METABOLIC PANEL - Abnormal; Notable for the following components:      Result Value   Glucose, Bld 179 (*)    Creatinine, Ser 1.94 (*)    Calcium 8.5 (*)    GFR, Estimated 42 (*)    All other components within normal limits  BRAIN NATRIURETIC PEPTIDE - Abnormal; Notable for the following components:   B Natriuretic Peptide 1,494.7 (*)    All other components within normal limits  TROPONIN I (HIGH SENSITIVITY) - Abnormal; Notable for the following components:   Troponin I (High Sensitivity) 124 (*)    All other components within normal limits  CBC WITH DIFFERENTIAL/PLATELET  TROPONIN I (HIGH SENSITIVITY)    EKG EKG Interpretation  Date/Time:  Monday October 18 2022 18:16:37 EST Ventricular Rate:  86 PR Interval:  182 QRS Duration: 98 QT Interval:  420 QTC Calculation: 502 R Axis:   93 Text Interpretation: Normal sinus rhythm Possible Left atrial enlargement Rightward axis Prolonged QT Abnormal ECG When compared with ECG of 17-Mar-2022 10:51, PREVIOUS ECG IS PRESENT Confirmed by Carmin Muskrat [phone]) on 10/18/2022 9:35:28 PM  Radiology DG Chest 2 View  Result Date: 10/18/2022 CLINICAL DATA:  Shortness of breath, chest pain, cough EXAM: CHEST - 2 VIEW COMPARISON:  02/28/2022 FINDINGS: Left AICD remains in place, unchanged.  Cardiomegaly, vascular congestion. Mild peribronchial thickening and interstitial prominence. No effusions. No acute bony abnormality. IMPRESSION: Cardiomegaly with vascular congestion. Peribronchial thickening and interstitial prominence could reflect bronchitis, less likely interstitial edema. Electronically Signed   By: Rolm Baptise M.D.   On: 10/18/2022 19:16    Procedures Procedures    Medications Ordered in ED Medications - No data to display  ED Course/ Medical Decision Making/ A&P                           Medical Decision Making  The patient is  a 46 year old male past medical history of CAD, T2DM, HTN, ICD, nonischemic cardiomyopathy, systolic CHF who is presenting for evaluation of shortness of breath.  The differential diagnosis considered includes: CHF exacerbation, ACS, PE, pneumonia, viral respiratory infection, anemia.  On initial evaluation, the patient was hemodynamically stable and afebrile.  His physical exam was significant for JVD halfway to the angle of the mandible and 2+ lower extremity edema without additional findings.  I independently reviewed and interpreted all parts of patient's diagnostic workup which included a EKG which showed normal sinus rhythm with a prolonged QTc of 502 but no acute ischemic changes.  He received a chest x-ray which showed cardiomegaly with peribronchial thickening but low concern for interstitial edema.  His laboratory workup included CBC with white blood cell count of 7.0 and hemoglobin of 16.1; BMP with glucose elevated 179, creatinine 1.94 from a baseline of around 1.35; troponin elevated to 124 with second troponin 244; and BNP elevated 1494.  Based on the patient's history, physical exam, and diagnostic workup a CHF exacerbation is favored as likely source of patient's symptoms.  While ACS was considered, the absence of chest pain and nonischemic EKG make this less likely at this time.  The patient's troponin elevation is favored to be  secondary to demand ischemia from his volume overload.  PE was also considered however the patient is not tachycardic, or hypoxic and does not have associated chest pain making this less likely at this time.  There were also no infectious symptoms to support infectious etiology.  The patient was admitted to the hospital service for management of CHF exacerbation.  He was started on IV Lasix in the emergency department.  Amount and/or Complexity of Data Reviewed External Data Reviewed: labs, radiology, ECG and notes. Labs: ordered. Decision-making details documented in ED Course. Radiology: ordered and independent interpretation performed. Decision-making details documented in ED Course. ECG/medicine tests: ordered and independent interpretation performed. Decision-making details documented in ED Course.  Risk Decision regarding hospitalization.   Patient's presentation is most consistent with acute presentation with potential threat to life or bodily function.         Final Clinical Impression(s) / ED Diagnoses Final diagnoses:  Acute on chronic systolic congestive heart failure [facility])    Rx / DC Orders ED Discharge Orders     None         Dani Gobble, MD 10/18/22 2240    Carmin Muskrat, MD 10/19/22 2138

## 2022-10-18 NOTE — ED Notes (Signed)
Troponin 124. Lauren A, PA informed.

## 2022-10-18 NOTE — H&P (Signed)
History and Physical    Douglas [name] YFV:[id number] DOB: 01-25-1976 DOA: 10/18/2022  PCP: Gildardo Pounds, NP   Patient coming from: Home   Chief Complaint: SOB   HPI: Douglas [name] is a 46 y.o. 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  Patient reports worsening shortness of breath over the past 1 day.  He has a mild cough associated with this.  He has some chest discomfort when he coughs, but not with exertion.  He has not noticed a change in his chronic leg swelling.  He reports adherence with all of his medications.  He typically tries to avoid salty foods but did have a salty meal 2 nights ago.  He denies fevers or chills.  Denies leg tenderness, and denies hemoptysis.  EF was 25-30% with global hypokinesis, severely dilated LV, moderately reduced RV systolic function, severe left atrial enlargement, and moderate-severe MR on TTE in April 2023.   ED Course: Upon arrival to the ED, patient is found to be afebrile and saturating mid 90s on room air with mild tachypnea and stable blood pressure.  EKG demonstrates sinus rhythm with right axis deviation and QTc 502.  Chest x-ray with cardiomegaly, vascular congestion, parabronchial thickening, and interstitial prominence.  Blood work notable for BNP 1004-95, troponin 124, and creatinine 1.94.  He was given 80 mg IV Lasix in the ED.  Review of Systems:  All other systems reviewed and apart from HPI, are negative.  Past Medical History:  Diagnosis Date   Depression, major, single episode 01/14/2017   Diabetes mellitus [facility])    ED (erectile dysfunction)    History of syncope 01/29/2015   Hypertension    ICD (implantable cardioverter-defibrillator) discharge 11/30/2014   On 11/30/14. Asymptomatic.    Nonischemic cardiomyopathy ([place])    a.  echo 4/06: EF 30%,  mild to mod MR, mild RAE, inf HK, lat HK , ant HK;    b.  cath 4/06: no CAD, EF 20-25%   NSVT (nonsustained ventricular tachycardia) ([facility])    Obesity    Systolic CHF, chronic ([place])    EF 11/17 25-30%, s/p ICD    Past Surgical History:  Procedure Laterality Date   CARDIAC CATHETERIZATION  09/2011; 02/2013; 04/2013   CARDIAC DEFIBRILLATOR PLACEMENT  08/23/2013   IMPLANTABLE CARDIOVERTER DEFIBRILLATOR IMPLANT N/A 08/23/2013   Procedure: IMPLANTABLE CARDIOVERTER DEFIBRILLATOR IMPLANT;  Surgeon: Deboraha Sprang, MD;  Location: [facility] CATH LAB;  Service: Cardiovascular;  Laterality: N/A;   LEFT AND RIGHT HEART CATHETERIZATION WITH CORONARY ANGIOGRAM N/A 09/20/2011   Procedure: LEFT AND RIGHT HEART CATHETERIZATION WITH CORONARY ANGIOGRAM;  Surgeon: Jolaine Artist, MD;  Location: [facility] CATH LAB;  Service: Cardiovascular;  Laterality: N/A;   MULTIPLE EXTRACTIONS WITH ALVEOLOPLASTY N/A 01/26/2013   Procedure:  EXTRACION  TOOTH # 19 WITH ALVEOLOPLASTY;  Surgeon: Lenn Cal, DDS;  Location: [facility];  Service: Oral Surgery;  Laterality: N/A;   RIGHT HEART CATHETERIZATION N/A 02/22/2013   Procedure: RIGHT HEART CATH;  Surgeon: Jolaine Artist, MD;  Location: [facility] CATH LAB;  Service: Cardiovascular;  Laterality: N/A;   RIGHT HEART CATHETERIZATION N/A 05/03/2013   Procedure: RIGHT HEART CATH;  Surgeon: Jolaine Artist, MD;  Location: [facility] CATH LAB;  Service: Cardiovascular;  Laterality: N/A;   RIGHT/LEFT HEART CATH AND CORONARY ANGIOGRAPHY N/A 03/03/2022   Procedure: RIGHT/LEFT HEART CATH  AND CORONARY ANGIOGRAPHY;  Surgeon: Jolaine Artist, MD;  Location: [facility] LAB;  Service: Cardiovascular;  Laterality: N/A;    Social History:   reports that he has been smoking cigarettes. He has a 2.00 pack-year smoking history. He has never used smokeless tobacco. He reports current alcohol use. He reports that he does not currently use drugs after having used the following drugs: Cocaine.  Allergies  Allergen  Reactions   Bydureon [Exenatide] Rash   Penicillins Other (See Comments)    Unknown childhood reaction     Family History  Problem Relation Age of Onset   Coronary artery disease Mother 50       s/p PCI   Lung cancer Father    Diabetes type II Maternal Uncle    Coronary artery disease Maternal Uncle    Stroke Neg Hx    Heart attack Neg Hx      Prior to Admission medications   Medication Sig Start Date End Date Taking? Authorizing Provider  Accu-Chek Softclix Lancets lancets Check blood sugar twice daily E11.8 04/03/21   Charlott Rakes, MD  acetaminophen (TYLENOL) 500 MG tablet Take 1 tablet (500 mg total) by mouth every 6 (six) hours as needed. 04/29/21   Gildardo Pounds, NP  aspirin 81 MG EC tablet Take 1 tablet (81 mg total) by mouth daily. 03/05/22   Lyda Jester M, PA-C  atorvastatin (LIPITOR) 40 MG tablet Take 1 tablet (40 mg total) by mouth daily. 03/05/22 03/05/23  Lyda Jester M, PA-C  Blood Glucose Calibration (ACCU-CHEK GUIDE CONTROL) LIQD 1 each by In Vitro route once as needed for up to 1 dose. 03/30/19   Gildardo Pounds, NP  Blood Glucose Monitoring Suppl (ACCU-CHEK GUIDE ME) w/Device KIT Check blood sugar twice daily E11.8 04/03/21   Charlott Rakes, MD  carvedilol (COREG) 3.125 MG tablet Take 1 tablet (3.125 mg total) by mouth 2 (two) times daily with a meal. 09/22/22   Milford, Maricela Bo, FNP  dapagliflozin propanediol (FARXIGA) 10 MG TABS tablet Take 1 tablet (10 mg total) by mouth daily before breakfast. 06/02/22 06/02/23  Milford, Maricela Bo, FNP  glucose blood (ACCU-CHEK GUIDE) test strip Check blood sugar twice daily (Must have office visit for refills) 08/09/22   Gildardo Pounds, NP  insulin glargine (LANTUS SOLOSTAR) 100 UNIT/ML Solostar Pen Inject 36 Units into the skin once daily. 08/09/22   Gildardo Pounds, NP  Insulin Pen Needle (BD PEN NEEDLE NANO U/F) 32G X 4 MM MISC INJECT 1 APPLICATION INTO THE SKIN DAILY. 08/24/22   Charlott Rakes, MD  metolazone  (ZAROXOLYN) 2.5 MG tablet Take 1 pill every other Wednesday 06/02/22 12/17/22  Rafael Bihari, FNP  potassium chloride SA (KLOR-CON M) 20 MEQ tablet Take 1 tablet (20 mEq total) by mouth 2 (two) times daily. 06/21/22   Larey Dresser, MD  sacubitril-valsartan (ENTRESTO) 49-51 MG TAKE 1 TABLET BY MOUTH 2 (TWO) TIMES DAILY. 03/05/22 03/05/23  Lyda Jester M, PA-C  torsemide (DEMADEX) 100 MG tablet Take 1 tablet (100 mg total) by mouth in the morning AND 0.5 tablets (50 mg total) every evening. 04/28/22   Rafael Bihari, FNP    Physical Exam: Vitals:   10/18/22 1812 10/18/22 1814 10/18/22 2052 10/18/22 2130  BP:  114/65 118/72 106/79  Pulse:  83 86 96  Resp:  18 18 (!) 22  Temp:  97.8 F (36.6 C) 98 F (36.7 C)   TempSrc:  Oral    SpO2:  94%  95% 93%  Weight: 131.5 kg     Height: _0  (1.93 m)       Constitutional: NAD, calm  Eyes: PERTLA, lids and conjunctivae normal ENMT: Mucous membranes are moist. Posterior pharynx clear of any exudate or lesions.   Neck: supple, no masses  Respiratory: Dyspneic with speech. No wheezing.    Cardiovascular: S1 & S2 heard, regular rate and rhythm. Pretibial pitting edema bilaterally. JVD. Abdomen: No distension, no tenderness, soft. Bowel sounds active.  Musculoskeletal: no clubbing / cyanosis. No joint deformity upper and lower extremities.   Skin: no significant rashes, lesions, ulcers. Warm, dry, well-perfused. Neurologic: CN 2-12 grossly intact. Moving all extremities. Alert and oriented.  Psychiatric: Calm. Cooperative.    Labs and Imaging on Admission: I have personally reviewed following labs and imaging studies  CBC: Recent Labs  Lab 10/18/22 1821  WBC 7.0  NEUTROABS 4.1  HGB 16.1  HCT 48.6  MCV 88.0  PLT 381   Basic Metabolic Panel: Recent Labs  Lab 10/18/22 1821  NA 137  K 4.0  CL 103  CO2 24  GLUCOSE 179*  BUN 15  CREATININE 1.94*  CALCIUM 8.5*   GFR: Estimated Creatinine Clearance: 69 mL/min (A) (by C-G  formula based on SCr of 1.94 mg/dL (H)). Liver Function Tests: No results for input(s): "AST", "ALT", "ALKPHOS", "BILITOT", "PROT", "ALBUMIN" in the last 168 hours. No results for input(s): "LIPASE", "AMYLASE" in the last 168 hours. No results for input(s): "AMMONIA" in the last 168 hours. Coagulation Profile: No results for input(s): "INR", "PROTIME" in the last 168 hours. Cardiac Enzymes: No results for input(s): "CKTOTAL", "CKMB", "CKMBINDEX", "TROPONINI" in the last 168 hours. BNP (last 3 results) No results for input(s): "PROBNP" in the last 8760 hours. HbA1C: No results for input(s): "HGBA1C" in the last 72 hours. CBG: No results for input(s): "GLUCAP" in the last 168 hours. Lipid Profile: No results for input(s): "CHOL", "HDL", "LDLCALC", "TRIG", "CHOLHDL", "LDLDIRECT" in the last 72 hours. Thyroid Function Tests: No results for input(s): "TSH", "T4TOTAL", "FREET4", "T3FREE", "THYROIDAB" in the last 72 hours. Anemia Panel: No results for input(s): "VITAMINB12", "FOLATE", "FERRITIN", "TIBC", "IRON", "RETICCTPCT" in the last 72 hours. Urine analysis:    Component Value Date/Time   COLORURINE STRAW (A) 06/11/2020 1423   APPEARANCEUR CLEAR 06/11/2020 1423   LABSPEC <=1.005 06/26/2021 1229   PHURINE 6.0 06/26/2021 1229   GLUCOSEU 250 (A) 06/26/2021 1229   HGBUR TRACE (A) 06/26/2021 1229   BILIRUBINUR NEGATIVE 06/26/2021 1229   KETONESUR NEGATIVE 06/26/2021 1229   PROTEINUR NEGATIVE 06/26/2021 1229   UROBILINOGEN 0.2 06/26/2021 1229   NITRITE NEGATIVE 06/26/2021 1229   LEUKOCYTESUR NEGATIVE 06/26/2021 1229   Sepsis Labs: _1 (procalcitonin:4,lacticidven:4) )No results found for this or any previous visit (from the past 240 hour(s)).   Radiological Exams on Admission: DG Chest 2 View  Result Date: 10/18/2022 CLINICAL DATA:  Shortness of breath, chest pain, cough EXAM: CHEST - 2 VIEW COMPARISON:  02/28/2022 FINDINGS: Left AICD remains in place, unchanged.  Cardiomegaly, vascular congestion. Mild peribronchial thickening and interstitial prominence. No effusions. No acute bony abnormality. IMPRESSION: Cardiomegaly with vascular congestion. Peribronchial thickening and interstitial prominence could reflect bronchitis, less likely interstitial edema. Electronically Signed   By: Rolm Baptise M.D.   On: 10/18/2022 19:16    EKG: Independently reviewed. Sinus rhythm, RAD, QTc 502.   Assessment/Plan   1. Acute on chronic systolic CHF  - Likely precipitated by dietary indiscretion  - Continue diuresis with 80 mg IV Lasix q12h,  hold Entresto given increased creatinine, continue Coreg as tolerated, monitor wt and I/Os, monitor renal function and electrolytes   2. 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 Insulin-dependent DM  - A1c was 6.3% in November 2023  - Check CBGs, continue long-acting insulin (with dose-reduction in light of decreased GFR), and use SSI as well for now    4. Elevated troponin  - No anginal complaints or acute ischemic features on EKG  - He had mild non-obstructive CAD on LHC in April 2023  - Likely supply-demand mismatch in setting of acute CHF  - Trend troponin, continue ASA and Lipitor    DVT prophylaxis: Lovenox  Code Status: Full  Level of Care: Level of care: Telemetry Cardiac Family Communication: none present  Disposition Plan:  Patient is from: home  Anticipated d/c is to: Home Anticipated d/c date is: Possibly as early as 12/5 or 10/20/22  Patient currently: pending improved volume status, stable renal function, transition back to oral diuretic  Consults called: none  Admission status: Observation     Vianne Bulls, MD [facility]  10/18/2022, 9:59 PM

## 2022-10-19 ENCOUNTER — Other Ambulatory Visit: Payer: Self-pay

## 2022-10-19 DIAGNOSIS — N179 Acute kidney failure, unspecified: Secondary | ICD-10-CM | POA: Diagnosis present

## 2022-10-19 DIAGNOSIS — E1122 Type 2 diabetes mellitus with diabetic chronic kidney disease: Secondary | ICD-10-CM | POA: Diagnosis present

## 2022-10-19 DIAGNOSIS — R059 Cough, unspecified: Secondary | ICD-10-CM | POA: Diagnosis not present

## 2022-10-19 DIAGNOSIS — Z888 Allergy status to other drugs, medicaments and biological substances status: Secondary | ICD-10-CM | POA: Diagnosis not present

## 2022-10-19 DIAGNOSIS — Z7982 Long term (current) use of aspirin: Secondary | ICD-10-CM | POA: Diagnosis not present

## 2022-10-19 DIAGNOSIS — E669 Obesity, unspecified: Secondary | ICD-10-CM | POA: Diagnosis present

## 2022-10-19 DIAGNOSIS — Z95810 Presence of automatic (implantable) cardiac defibrillator: Secondary | ICD-10-CM | POA: Diagnosis not present

## 2022-10-19 DIAGNOSIS — I428 Other cardiomyopathies: Secondary | ICD-10-CM | POA: Diagnosis present

## 2022-10-19 DIAGNOSIS — I2510 Atherosclerotic heart disease of native coronary artery without angina pectoris: Secondary | ICD-10-CM | POA: Diagnosis present

## 2022-10-19 DIAGNOSIS — Z794 Long term (current) use of insulin: Secondary | ICD-10-CM | POA: Diagnosis not present

## 2022-10-19 DIAGNOSIS — R079 Chest pain, unspecified: Secondary | ICD-10-CM | POA: Diagnosis not present

## 2022-10-19 DIAGNOSIS — I509 Heart failure, unspecified: Secondary | ICD-10-CM | POA: Diagnosis not present

## 2022-10-19 DIAGNOSIS — I110 Hypertensive heart disease with heart failure: Secondary | ICD-10-CM | POA: Diagnosis not present

## 2022-10-19 DIAGNOSIS — Z6835 Body mass index (BMI) 35.0-35.9, adult: Secondary | ICD-10-CM | POA: Diagnosis not present

## 2022-10-19 DIAGNOSIS — Z79899 Other long term (current) drug therapy: Secondary | ICD-10-CM | POA: Diagnosis not present

## 2022-10-19 DIAGNOSIS — Z833 Family history of diabetes mellitus: Secondary | ICD-10-CM | POA: Diagnosis not present

## 2022-10-19 DIAGNOSIS — F17210 Nicotine dependence, cigarettes, uncomplicated: Secondary | ICD-10-CM | POA: Diagnosis present

## 2022-10-19 DIAGNOSIS — N1831 Chronic kidney disease, stage 3a: Secondary | ICD-10-CM | POA: Diagnosis present

## 2022-10-19 DIAGNOSIS — Z880 Allergy status to penicillin: Secondary | ICD-10-CM | POA: Diagnosis not present

## 2022-10-19 DIAGNOSIS — Z8249 Family history of ischemic heart disease and other diseases of the circulatory system: Secondary | ICD-10-CM | POA: Diagnosis not present

## 2022-10-19 DIAGNOSIS — E785 Hyperlipidemia, unspecified: Secondary | ICD-10-CM | POA: Diagnosis present

## 2022-10-19 DIAGNOSIS — F1410 Cocaine abuse, uncomplicated: Secondary | ICD-10-CM | POA: Diagnosis present

## 2022-10-19 DIAGNOSIS — I130 Hypertensive heart and chronic kidney disease with heart failure and stage 1 through stage 4 chronic kidney disease, or unspecified chronic kidney disease: Secondary | ICD-10-CM | POA: Diagnosis present

## 2022-10-19 DIAGNOSIS — I2489 Other forms of acute ischemic heart disease: Secondary | ICD-10-CM | POA: Diagnosis present

## 2022-10-19 DIAGNOSIS — I5023 Acute on chronic systolic (congestive) heart failure: Secondary | ICD-10-CM | POA: Diagnosis not present

## 2022-10-19 DIAGNOSIS — R0602 Shortness of breath: Secondary | ICD-10-CM | POA: Diagnosis not present

## 2022-10-19 LAB — BASIC METABOLIC PANEL
Anion gap: 9 (ref 5–15)
BUN: 22 mg/dL — ABNORMAL HIGH (ref 6–20)
CO2: 26 mmol/L (ref 22–32)
Calcium: 8.9 mg/dL (ref 8.9–10.3)
Chloride: 105 mmol/L (ref 98–111)
Creatinine, Ser: 1.95 mg/dL — ABNORMAL HIGH (ref 0.61–1.24)
GFR, Estimated: 42 mL/min — ABNORMAL LOW (ref >60)
Glucose, Bld: 88 mg/dL (ref 70–99)
Potassium: 4.2 mmol/L (ref 3.5–5.1)
Sodium: 140 mmol/L (ref 135–145)

## 2022-10-19 LAB — MAGNESIUM: Magnesium: 2.1 mg/dL (ref 1.7–2.4)

## 2022-10-19 LAB — CBG MONITORING, ED
Glucose-Capillary: 112 mg/dL — ABNORMAL HIGH (ref 70–99)
Glucose-Capillary: 132 mg/dL — ABNORMAL HIGH (ref 70–99)

## 2022-10-19 LAB — GLUCOSE, CAPILLARY
Glucose-Capillary: 151 mg/dL — ABNORMAL HIGH (ref 70–99)
Glucose-Capillary: 155 mg/dL — ABNORMAL HIGH (ref 70–99)

## 2022-10-19 LAB — CBC
HCT: 48.8 % (ref 39.0–52.0)
Hemoglobin: 16.5 g/dL (ref 13.0–17.0)
MCH: 29.3 pg (ref 26.0–34.0)
MCHC: 33.8 g/dL (ref 30.0–36.0)
MCV: 86.5 fL (ref 80.0–100.0)
Platelets: 202 10*3/uL (ref 150–400)
RBC: 5.64 MIL/uL (ref 4.22–5.81)
RDW: 13.3 % (ref 11.5–15.5)
WBC: 7.7 10*3/uL (ref 4.0–10.5)
nRBC: 0 % (ref 0.0–0.2)

## 2022-10-19 MED ORDER — PHENOL 1.4 % MT LIQD
1.0000 | OROMUCOSAL | Status: DC | PRN
  Administered 2022-10-19: 1 via OROMUCOSAL
  Filled 2022-10-19: qty 177

## 2022-10-19 MED ORDER — GUAIFENESIN-DM 100-10 MG/5ML PO SYRP
5.0000 mL | ORAL_SOLUTION | ORAL | Status: DC | PRN
  Administered 2022-10-19 – 2022-10-21 (×4): 5 mL via ORAL
  Filled 2022-10-19 (×5): qty 5

## 2022-10-19 MED ORDER — BENZONATATE 100 MG PO CAPS
200.0000 mg | ORAL_CAPSULE | Freq: Three times a day (TID) | ORAL | Status: DC | PRN
  Administered 2022-10-19 – 2022-10-20 (×2): 200 mg via ORAL
  Filled 2022-10-19 (×2): qty 2

## 2022-10-19 NOTE — Progress Notes (Addendum)
Pt arrived to unit, Alert and oriented. Placed on cardiac monitor.   Pt had 18 bt run of v-tach. Pt asymptomatic. Denied chest pain.Pt has ICD in place. Ghimire MD paged to make aware.   Pt currently in bed watching television, call bell within reach.

## 2022-10-19 NOTE — Progress Notes (Signed)
TR[facility] night cross cover note:   I was notified by RN of patient's persistent cough with existing prn Robitussin with dextromethorphan.  I subsequently added prn Tessalon Perles to this regimen for refractory cough.     Douglas Pigg, DO Hospitalist

## 2022-10-19 NOTE — Progress Notes (Signed)
PROGRESS NOTE    Douglas [name]  XBM:[id number] DOB: Dec 27, 1975 DOA: 10/18/2022 PCP: Claiborne Rigg, NP    Brief Narrative:  46 year old with history of essential hypertension, hyperlipidemia, obesity, cocaine use, chronic systolic heart failure with known ejection fraction 25% status post ICD, nonobstructive coronary artery disease presented to the emergency room with shortness of breath for 1 day and gradually worsening symptoms for about a week.  In the emergency room afebrile, on room air.  Mildly tachypneic.  Blood pressure stable.  Chest x-ray with cardiomegaly and vascular congestion.  BNP more than thousand.  Creatinine 1.94.  Admitted with CHF exacerbation.   Assessment & Plan:   Acute on chronic systolic heart failure: Presented with dyspnea on mobility, orthopnea and weight gain. night time cough. Patient is on IV diuretics and responding.  Known ejection fraction 25%.  Has ICD in place.  Probably dietary discrepancies.  Also intermittent use of cocaine. Still with significant symptoms. Recorded weight 131 kg.  No previous weight available. Continue IV Lasix 80 mg twice daily, continue intake output monitoring.  Continue Coreg.  Holding Entresto due to increased creatinine.  Daily weight.  Acute kidney injury superimposed on chronic kidney disease stage II: Recent creatinine 1.37.  Presentation creatinine 1.94.  Probably due to decompensated heart failure.  Urine output is adequate.  Recheck tomorrow morning.  Holding [place].  Type 2 diabetes on insulin: Continue insulin and uptitrate as needed.  Troponin elevation: Mild troponin elevation secondary to demand ischemia.  Recent cardiac cath in April with mild coronary artery disease.  Patient is on aspirin and Lipitor.  Continue.  Denies any chest pain.  Cocaine use: Counseled.  Patient tells me he has cut down a lot and working on stopping it.  Patient is also working on housing options and getting back to his good  health.     DVT prophylaxis: enoxaparin (LOVENOX) injection 40 mg Start: 10/18/22 2200   Code Status: Full code Family Communication: None at bedside Disposition Plan: Status is: Observation The patient will require care spanning > 2 midnights and should be moved to inpatient because: IV diuresis, significant heart failure symptoms     Consultants:  None  Procedures:  None  Antimicrobials:  None   Subjective: Patient seen and examined.  Still in the emergency room.  He feels much better than yesterday.  Still feels short of breath on mobilizing around.  Urine output is not recorded but he tells me he urinated about 8 of the bedside urinals.  Objective: Vitals:   10/19/22 0625 10/19/22 0728 10/19/22 0800 10/19/22 0935  BP:  125/86 (!) 118/95 105/84  Pulse:  87 79 86  Resp:   16 19  Temp: 98.5 F (36.9 C)     TempSrc: Oral     SpO2:   95% 93%  Weight:      Height:        Intake/Output Summary (Last 24 hours) at 10/19/2022 1121 Last data filed at 10/19/2022 0934 Gross per 24 hour  Intake --  Output 800 ml  Net -800 ml   Filed Weights   10/18/22 1812  Weight: 131.5 kg    Examination:  General exam: Appears calm and comfortable.  On room air. Respiratory system: Bilateral basal crackles present.  Good air entry.  On room air.  Not in any distress. Cardiovascular system: S1 & S2 heard, RRR.  Trace bilateral pedal edema. Gastrointestinal system: Abdomen is nondistended, soft and nontender. No organomegaly or masses felt. Normal bowel  sounds heard.  Obese and pendulous. Central nervous system: Alert and oriented. No focal neurological deficits. Extremities: Symmetric 5 x 5 power. Skin: No rashes, lesions or ulcers Psychiatry: Judgement and insight appear normal. Mood & affect appropriate.     Data Reviewed: I have personally reviewed following labs and imaging studies  CBC: Recent Labs  Lab 10/18/22 1821 10/19/22 0258  WBC 7.0 7.7  NEUTROABS 4.1  --    HGB 16.1 16.5  HCT 48.6 48.8  MCV 88.0 86.5  PLT 199 202   Basic Metabolic Panel: Recent Labs  Lab 10/18/22 1821 10/19/22 0258  NA 137 140  K 4.0 4.2  CL 103 105  CO2 24 26  GLUCOSE 179* 88  BUN 15 22*  CREATININE 1.94* 1.95*  CALCIUM 8.5* 8.9  MG  --  2.1   GFR: Estimated Creatinine Clearance: 68.6 mL/min (A) (by C-G formula based on SCr of 1.95 mg/dL (H)). Liver Function Tests: No results for input(s): "AST", "ALT", "ALKPHOS", "BILITOT", "PROT", "ALBUMIN" in the last 168 hours. No results for input(s): "LIPASE", "AMYLASE" in the last 168 hours. No results for input(s): "AMMONIA" in the last 168 hours. Coagulation Profile: No results for input(s): "INR", "PROTIME" in the last 168 hours. Cardiac Enzymes: No results for input(s): "CKTOTAL", "CKMB", "CKMBINDEX", "TROPONINI" in the last 168 hours. BNP (last 3 results) No results for input(s): "PROBNP" in the last 8760 hours. HbA1C: No results for input(s): "HGBA1C" in the last 72 hours. CBG: Recent Labs  Lab 10/18/22 2312 10/19/22 0723  GLUCAP 108* 112*   Lipid Profile: No results for input(s): "CHOL", "HDL", "LDLCALC", "TRIG", "CHOLHDL", "LDLDIRECT" in the last 72 hours. Thyroid Function Tests: No results for input(s): "TSH", "T4TOTAL", "FREET4", "T3FREE", "THYROIDAB" in the last 72 hours. Anemia Panel: No results for input(s): "VITAMINB12", "FOLATE", "FERRITIN", "TIBC", "IRON", "RETICCTPCT" in the last 72 hours. Sepsis Labs: No results for input(s): "PROCALCITON", "LATICACIDVEN" in the last 168 hours.  No results found for this or any previous visit (from the past 240 hour(s)).       Radiology Studies: DG Chest 2 View  Result Date: 10/18/2022 CLINICAL DATA:  Shortness of breath, chest pain, cough EXAM: CHEST - 2 VIEW COMPARISON:  02/28/2022 FINDINGS: Left AICD remains in place, unchanged. Cardiomegaly, vascular congestion. Mild peribronchial thickening and interstitial prominence. No effusions. No acute  bony abnormality. IMPRESSION: Cardiomegaly with vascular congestion. Peribronchial thickening and interstitial prominence could reflect bronchitis, less likely interstitial edema. Electronically Signed   By: Charlett Nose M.D.   On: 10/18/2022 19:16        Scheduled Meds:  aspirin EC  81 mg Oral Daily   atorvastatin  40 mg Oral Daily   carvedilol  3.125 mg Oral BID WC   enoxaparin (LOVENOX) injection  40 mg Subcutaneous Q24H   furosemide  80 mg Intravenous Q12H   insulin aspart  0-5 Units Subcutaneous QHS   insulin aspart  0-6 Units Subcutaneous TID WC   insulin glargine-yfgn  10 Units Subcutaneous Daily   sodium chloride flush  3 mL Intravenous Q12H   Continuous Infusions:   LOS: 0 days    Time spent: 35 minutes    Dorcas Carrow, MD [facility] Pager [phone]

## 2022-10-19 NOTE — Consult Note (Signed)
   [facility] Inpatient Consult   10/19/2022  OSMIN WELZ [birth year] [id number]  Managed Medicaid: High Risk  1:15 pm Arriving to floor in patient care at current time.  Continue to follow.  Charlesetta Shanks, RN BSN CCM [facility]  [phone] business mobile phone Toll free office [phone]  *Concierge Line  [phone] Fax number: [phone] [email or web address]

## 2022-10-19 NOTE — Progress Notes (Signed)
Heart Failure Navigator Progress Note  Assessed for Heart & Vascular TOC clinic readiness.  Patient does not meet criteria due to prior to hospitalization care established with AHF clinic and Dr. Bensimhon.  HF Navigation team will sign-off.   Caedan Sumler, MSN, RN Heart Failure Nurse Navigator   

## 2022-10-20 ENCOUNTER — Other Ambulatory Visit (HOSPITAL_COMMUNITY): Payer: Self-pay

## 2022-10-20 DIAGNOSIS — I5023 Acute on chronic systolic (congestive) heart failure: Secondary | ICD-10-CM | POA: Diagnosis not present

## 2022-10-20 LAB — CBC
HCT: 49 % (ref 39.0–52.0)
Hemoglobin: 17 g/dL (ref 13.0–17.0)
MCH: 29.3 pg (ref 26.0–34.0)
MCHC: 34.7 g/dL (ref 30.0–36.0)
MCV: 84.3 fL (ref 80.0–100.0)
Platelets: 199 10*3/uL (ref 150–400)
RBC: 5.81 MIL/uL (ref 4.22–5.81)
RDW: 13.2 % (ref 11.5–15.5)
WBC: 8.4 10*3/uL (ref 4.0–10.5)
nRBC: 0 % (ref 0.0–0.2)

## 2022-10-20 LAB — GLUCOSE, CAPILLARY
Glucose-Capillary: 124 mg/dL — ABNORMAL HIGH (ref 70–99)
Glucose-Capillary: 169 mg/dL — ABNORMAL HIGH (ref 70–99)
Glucose-Capillary: 135 mg/dL — ABNORMAL HIGH (ref 70–99)
Glucose-Capillary: 136 mg/dL — ABNORMAL HIGH (ref 70–99)

## 2022-10-20 LAB — BASIC METABOLIC PANEL
Anion gap: 12 (ref 5–15)
BUN: 25 mg/dL — ABNORMAL HIGH (ref 6–20)
CO2: 28 mmol/L (ref 22–32)
Calcium: 9.4 mg/dL (ref 8.9–10.3)
Chloride: 97 mmol/L — ABNORMAL LOW (ref 98–111)
Creatinine, Ser: 1.8 mg/dL — ABNORMAL HIGH (ref 0.61–1.24)
GFR, Estimated: 46 mL/min — ABNORMAL LOW (ref >60)
Glucose, Bld: 113 mg/dL — ABNORMAL HIGH (ref 70–99)
Potassium: 3.4 mmol/L — ABNORMAL LOW (ref 3.5–5.1)
Sodium: 137 mmol/L (ref 135–145)

## 2022-10-20 MED ORDER — DAPAGLIFLOZIN PROPANEDIOL 10 MG PO TABS
10.0000 mg | ORAL_TABLET | Freq: Every day | ORAL | Status: DC
  Administered 2022-10-20 – 2022-10-21 (×2): 10 mg via ORAL
  Filled 2022-10-20 (×2): qty 1

## 2022-10-20 MED ORDER — TORSEMIDE 20 MG PO TABS
60.0000 mg | ORAL_TABLET | Freq: Every day | ORAL | Status: DC
  Administered 2022-10-20: 60 mg via ORAL
  Filled 2022-10-20: qty 3

## 2022-10-20 MED ORDER — ENTRESTO 24-26 MG PO TABS
1.0000 | ORAL_TABLET | Freq: Two times a day (BID) | ORAL | 0 refills | Status: DC
  Filled 2022-10-20: qty 60, 30d supply, fill #0

## 2022-10-20 NOTE — Progress Notes (Signed)
   10/20/22 1000  Mobility  Activity Ambulated independently in hallway  Level of Assistance Independent  Assistive Device None  Distance Ambulated (ft) 550 ft  Activity Response Tolerated well  Mobility Referral Yes  $Mobility charge 1 Mobility   Mobility Specialist Progress Note  Received pt in bed having no complaints and agreeable to mobility. Pt was asymptomatic throughout ambulation and returned to room w/o fault. Left in EOB w/ call bell in reach and all needs met.  Lucious Groves Mobility Specialist  Please contact via SecureChat or Rehab office at [phone]

## 2022-10-20 NOTE — Progress Notes (Signed)
Came in room to give pt insulin.  Pt was a little dazed and said he felt really funny a bit ago after eating lunch.  Pt stated he had a h/a, drifted off to sleep, but doesn't remember sequence, then ate spaghetti.  At some point, but stated he is so tired and sleepy.  Pt stated he felt a weird sensation in his left side of face as if it got hard and swollen.  He started to get up, felt SOB, but felt very dizzy.  Assessed for stroke symptoms, but all negative.  Notified MD.   Orders to hold patient until MD assesses.  BP 103/71  , HR 80, 95% oxygen room air.  Pt now laying in bed.  Will continue to monitor.

## 2022-10-20 NOTE — Consult Note (Signed)
   [facility] CM Inpatient Consult   10/20/2022  Douglas [name] 1976-09-29 [id number]  Patient is participating in a Managed Medicaid Plan:  Yes with [employer].  Met with patient at the bedside and explain reason for the rounding visit.  Chart reviewed for post hospital needs.  Patient states ongoing struggle with permanent housing states, "I'm making it but still working and working on things."  Explained  follow up for chronic disease and states he has a good phone number and agreeable to follow up.  Plan: Will request post hospital follow up with MM team  Natividad Brood, RN BSN [place]  [phone] business mobile phone Toll free office [phone]  *[place]  [phone] Fax number: [phone] [email or web address]

## 2022-10-20 NOTE — Progress Notes (Addendum)
PROGRESS NOTE    Douglas [name]  [id number] DOB: 1976-09-14 DOA: 10/18/2022 PCP: Gildardo Pounds, NP    Brief Narrative:  46 year old with history of essential hypertension, hyperlipidemia, obesity, cocaine use, chronic systolic heart failure with known ejection fraction 25% status post ICD, nonobstructive coronary artery disease presented to the emergency room with shortness of breath for 1 day and gradually worsening symptoms for about a week.  In the emergency room afebrile, on room air.  Mildly tachypneic.  Blood pressure stable.  Chest x-ray with cardiomegaly and vascular congestion.  BNP more than thousand.  Creatinine 1.94.  Admitted with CHF exacerbation.   Assessment & Plan:   Acute on chronic systolic heart failure: NICM Presented with dyspnea on mobility, orthopnea and weight gain. -last echo 4/23 ECHO w ejection fraction 25-30%, mod reduced RV.  Has ICD in place.  -dietary discrepancies, intermittent use of cocaine. Allegedly compliant w/ meds -appears euvolemic and some orthostatic symptoms,will hold further iV lasix -change to Po torsemide, resume farxiga -resume entresto at lower dose at DC  Acute kidney injury superimposed on chronic kidney disease stage II: Recent creatinine 1.37.  Presentation creatinine 1.94.  Probably due to decompensated heart failure.  Urine output is adequate.   -Holding Entresto.  Type 2 diabetes on insulin: Continue insulin and uptitrate as needed.  Troponin elevation: Mild troponin elevation secondary to demand ischemia.  Recent cardiac cath in April with mild coronary artery disease.   -Patient is on aspirin and Lipitor.   Cocaine use: Counseled.  Patient tells me he has cut down a lot and working on stopping it.  Patient is also working on housing options and getting back to his good health.     DVT prophylaxis: enoxaparin (LOVENOX) injection 40 mg Start: 10/18/22 2200   Code Status: Full code Family Communication: None at  bedside Disposition Plan: Home in 1-2days     Antimicrobials:  None   Subjective: Feels well, walked earlier, ready to go home  Objective: Vitals:   10/19/22 1915 10/20/22 0012 10/20/22 0538 10/20/22 0714  BP: 100/77 106/67 105/70 90/76  Pulse: 69 88 71 73  Resp: 19 19 19 18   Temp: 98 F (36.7 C) 98 F (36.7 C) 99.6 F (37.6 C) 98.9 F (37.2 C)  TempSrc: Oral Oral Oral Oral  SpO2: 98% 98% 97% 92%  Weight:   124.1 kg   Height:        Intake/Output Summary (Last 24 hours) at 10/20/2022 1359 Last data filed at 10/20/2022 0830 Gross per 24 hour  Intake 1262 ml  Output 1475 ml  Net -213 ml   Filed Weights   10/18/22 1812 10/19/22 1210 10/20/22 0538  Weight: 131.5 kg 127.9 kg 124.1 kg    Examination:  Gen: Awake, Alert, Oriented X 3,  HEENT: no JVD Lungs: decreased BS at bases CVS: S1S2/RRR Abd: soft, Non tender, non distended, BS present Extremities: No edema Skin: no new rashes on exposed skin     Data Reviewed: I have personally reviewed following labs and imaging studies  CBC: Recent Labs  Lab 10/18/22 1821 10/19/22 0258 10/20/22 0025  WBC 7.0 7.7 8.4  NEUTROABS 4.1  --   --   HGB 16.1 16.5 17.0  HCT 48.6 48.8 49.0  MCV 88.0 86.5 84.3  PLT 199 202 [id number]   Basic Metabolic Panel: Recent Labs  Lab 10/18/22 1821 10/19/22 0258 10/20/22 0025  NA 137 140 137  K 4.0 4.2 3.4*  CL 103 105 97*  CO2  24 26 28   GLUCOSE 179* 88 113*  BUN 15 22* 25*  CREATININE 1.94* 1.95* 1.80*  CALCIUM 8.5* 8.9 9.4  MG  --  2.1  --    GFR: Estimated Creatinine Clearance: 72.2 mL/min (A) (by C-G formula based on SCr of 1.8 mg/dL (H)). Liver Function Tests: No results for input(s): "AST", "ALT", "ALKPHOS", "BILITOT", "PROT", "ALBUMIN" in the last 168 hours. No results for input(s): "LIPASE", "AMYLASE" in the last 168 hours. No results for input(s): "AMMONIA" in the last 168 hours. Coagulation Profile: No results for input(s): "INR", "PROTIME" in the last 168  hours. Cardiac Enzymes: No results for input(s): "CKTOTAL", "CKMB", "CKMBINDEX", "TROPONINI" in the last 168 hours. BNP (last 3 results) No results for input(s): "PROBNP" in the last 8760 hours. HbA1C: No results for input(s): "HGBA1C" in the last 72 hours. CBG: Recent Labs  Lab 10/19/22 1134 10/19/22 1602 10/19/22 2106 10/20/22 0623 10/20/22 1102  GLUCAP 132* 151* 155* 124* 169*   Lipid Profile: No results for input(s): "CHOL", "HDL", "LDLCALC", "TRIG", "CHOLHDL", "LDLDIRECT" in the last 72 hours. Thyroid Function Tests: No results for input(s): "TSH", "T4TOTAL", "FREET4", "T3FREE", "THYROIDAB" in the last 72 hours. Anemia Panel: No results for input(s): "VITAMINB12", "FOLATE", "FERRITIN", "TIBC", "IRON", "RETICCTPCT" in the last 72 hours. Sepsis Labs: No results for input(s): "PROCALCITON", "LATICACIDVEN" in the last 168 hours.  No results found for this or any previous visit (from the past 240 hour(s)).       Radiology Studies: DG Chest 2 View  Result Date: 10/18/2022 CLINICAL DATA:  Shortness of breath, chest pain, cough EXAM: CHEST - 2 VIEW COMPARISON:  02/28/2022 FINDINGS: Left AICD remains in place, unchanged. Cardiomegaly, vascular congestion. Mild peribronchial thickening and interstitial prominence. No effusions. No acute bony abnormality. IMPRESSION: Cardiomegaly with vascular congestion. Peribronchial thickening and interstitial prominence could reflect bronchitis, less likely interstitial edema. Electronically Signed   By: 03/02/2022 M.D.   On: 10/18/2022 19:16        Scheduled Meds:  aspirin EC  81 mg Oral Daily   atorvastatin  40 mg Oral Daily   carvedilol  3.125 mg Oral BID WC   enoxaparin (LOVENOX) injection  40 mg Subcutaneous Q24H   insulin aspart  0-5 Units Subcutaneous QHS   insulin aspart  0-6 Units Subcutaneous TID WC   insulin glargine-yfgn  10 Units Subcutaneous Daily   sodium chloride flush  3 mL Intravenous Q12H   torsemide  60 mg Oral  Daily   Continuous Infusions:   LOS: 1 day    Time spent: 35 minutes    14/02/2022, MD [facility]

## 2022-10-20 NOTE — Plan of Care (Signed)
  Problem: Cardiac: Goal: Ability to achieve and maintain adequate cardiopulmonary perfusion will improve 10/20/2022 2330 by Jeanella Flattery, RN Outcome: Adequate for Discharge 10/20/2022 2330 by Jeanella Flattery, RN Reactivated

## 2022-10-21 DIAGNOSIS — I5023 Acute on chronic systolic (congestive) heart failure: Secondary | ICD-10-CM | POA: Diagnosis not present

## 2022-10-21 LAB — CBC
HCT: 48.5 % (ref 39.0–52.0)
Hemoglobin: 17.1 g/dL — ABNORMAL HIGH (ref 13.0–17.0)
MCH: 29.3 pg (ref 26.0–34.0)
MCHC: 35.3 g/dL (ref 30.0–36.0)
MCV: 83.2 fL (ref 80.0–100.0)
Platelets: 204 10*3/uL (ref 150–400)
RBC: 5.83 MIL/uL — ABNORMAL HIGH (ref 4.22–5.81)
RDW: 13.1 % (ref 11.5–15.5)
WBC: 8.8 10*3/uL (ref 4.0–10.5)
nRBC: 0 % (ref 0.0–0.2)

## 2022-10-21 LAB — BASIC METABOLIC PANEL
Anion gap: 12 (ref 5–15)
BUN: 30 mg/dL — ABNORMAL HIGH (ref 6–20)
CO2: 29 mmol/L (ref 22–32)
Calcium: 9 mg/dL (ref 8.9–10.3)
Chloride: 97 mmol/L — ABNORMAL LOW (ref 98–111)
Creatinine, Ser: 2.03 mg/dL — ABNORMAL HIGH (ref 0.61–1.24)
GFR, Estimated: 40 mL/min — ABNORMAL LOW (ref >60)
Glucose, Bld: 104 mg/dL — ABNORMAL HIGH (ref 70–99)
Potassium: 3.7 mmol/L (ref 3.5–5.1)
Sodium: 138 mmol/L (ref 135–145)

## 2022-10-21 LAB — GLUCOSE, CAPILLARY
Glucose-Capillary: 146 mg/dL — ABNORMAL HIGH (ref 70–99)
Glucose-Capillary: 126 mg/dL — ABNORMAL HIGH (ref 70–99)

## 2022-10-21 MED ORDER — ENOXAPARIN SODIUM 60 MG/0.6ML IJ SOSY: 60.0000 mg | PREFILLED_SYRINGE | INTRAMUSCULAR | Status: DC

## 2022-10-21 NOTE — Discharge Summary (Signed)
Physician Discharge Summary  Douglas [name]:[id number] DOB: May 22, 1976 DOA: 10/18/2022  PCP: Gildardo Pounds, NP  Admit date: 10/18/2022 Discharge date: 10/21/2022  Time spent: 45 minutes  Recommendations for Outpatient Follow-up:  CHF neck in 1 to 2 weeks, message sent, needs repeat BMP and possibly resume Entresto if creatinine is improving   Discharge Diagnoses:  Principal Problem:   Acute on chronic systolic CHF (congestive heart failure) ([place]) Active Problems:   Elevated troponin   Diabetes mellitus with stage 2 chronic kidney disease ([place])   Acute renal failure superimposed on stage 2 chronic kidney disease ([place])   Non-occlusive coronary artery disease   CHF exacerbation ([place])   Discharge Condition: Improved  Diet recommendation: Low-sodium, heart healthy, diabetic  Filed Weights   10/19/22 1210 10/20/22 0538 10/21/22 0114  Weight: 127.9 kg 124.1 kg 123.5 kg    History of present illness:  46/M with history of chronic systolic CHF, NICM,, ICD, essential hypertension, hyperlipidemia, obesity, cocaine use,  nonobstructive coronary artery disease presented to the emergency room with shortness of breath for 1 day and gradually worsening symptoms for about a week. Chest x-ray with cardiomegaly and vascular congestion.  BNP more than thousand.  Creatinine 1.94.  Admitted with CHF exacerbation.   Hospital Course:   Acute on chronic systolic heart failure: NICM Presented with dyspnea on mobility, orthopnea and weight gain. -last echo 4/23 ECHO w ejection fraction 25-30%, mod reduced RV.  Has ICD in place.   -dietary discrepancies suspected, works at [employer], h/o cocaine use. Allegedly compliant w/ meds now and has not used any cocaine in several months -Diuresed well on IV Lasix, creatinine was 1.9 on admission, now 2.0, he is euvolemic at this time, weight down 12 pounds, down to 272lbs at DC -Resumed home regimen of torsemide as instructed by heart failure team  before with weekly metolazone, held off on restarting Entresto with mild worsening AKI, discussed with CHF team they will arrange close follow-up in 1 to 2 weeks with repeat labs   AKI on CKD 3a -Baseline creatinine around 1.5-1.6, creatinine was 1.94 on admission, trended up to 2.0 at the time of discharge -Diuretics as above, Entresto on hold for now -Needs BMP in 1 week   Type 2 diabetes on insulin: Continued on home regimen of insulin   Troponin elevation: Mild troponin elevation secondary to demand ischemia.  Recent cardiac cath in April with mild coronary artery disease.   -Patient is on aspirin, Coreg and Lipitor.    Cocaine use: Counseled.  Has cut down significantly,  patient is also working on better housing options and dietary indiscretions      Discharge Exam: Vitals:   10/21/22 0710 10/21/22 0756  BP: 93/80 (!) 130/96  Pulse: 88 85  Resp: 20   Temp: 98.9 F (37.2 C) 98.6 F (37 C)  SpO2: 93%    Gen: Obese male sitting up in bed, awake, Alert, Oriented X 3, no distress HEENT: no JVD Lungs: Good air movement bilaterally, CTAB CVS: S1S2/RRR Abd: soft, Non tender, non distended, BS present Extremities: No edema Skin: no new rashes on exposed skin   Discharge Instructions   Discharge Instructions     Diet - low sodium heart healthy   Complete by: As directed    Diet - low sodium heart healthy   Complete by: As directed    Diet Carb Modified   Complete by: As directed    Increase activity slowly   Complete by: As directed  Increase activity slowly   Complete by: As directed       Allergies as of 10/21/2022       Reactions   Bydureon [exenatide] Rash   Penicillins Other (See Comments)   Unknown childhood reaction        Medication List     STOP taking these medications    Entresto 49-51 MG Generic drug: sacubitril-valsartan       TAKE these medications    Accu-Chek Guide Control Liqd 1 each by In Vitro route once as needed for up to  1 dose.   Accu-Chek Guide Me w/Device Kit Check blood sugar twice daily E11.8   Accu-Chek Guide test strip Generic drug: glucose blood Check blood sugar twice daily (Must have office visit for refills)   Accu-Chek Softclix Lancets lancets Check blood sugar twice daily E11.8   acetaminophen 500 MG tablet Commonly known as: TYLENOL Take 1 tablet (500 mg total) by mouth every 6 (six) hours as needed.   Aspirin Low Dose 81 MG tablet Generic drug: aspirin EC Take 1 tablet (81 mg total) by mouth daily.   atorvastatin 40 MG tablet Commonly known as: LIPITOR Take 1 tablet (40 mg total) by mouth daily.   carvedilol 3.125 MG tablet Commonly known as: COREG Take 1 tablet (3.125 mg total) by mouth 2 (two) times daily with a meal.   Farxiga 10 MG Tabs tablet Generic drug: dapagliflozin propanediol Take 1 tablet (10 mg total) by mouth daily before breakfast.   Lantus SoloStar 100 UNIT/ML Solostar Pen Generic drug: insulin glargine Inject 36 Units into the skin once daily.   metolazone 2.5 MG tablet Commonly known as: ZAROXOLYN Take 1 pill every other Wednesday What changed: additional instructions   potassium chloride SA 20 MEQ tablet Commonly known as: KLOR-CON M Take 1 tablet (20 mEq total) by mouth 2 (two) times daily.   TechLite Pen Needles 32G X 4 MM Misc Generic drug: Insulin Pen Needle INJECT 1 APPLICATION INTO THE SKIN DAILY.   torsemide 100 MG tablet Commonly known as: DEMADEX Take 1 tablet (100 mg total) by mouth in the morning AND 0.5 tablets (50 mg total) every evening.       Allergies  Allergen Reactions   Bydureon [Exenatide] Rash   Penicillins Other (See Comments)    Unknown childhood reaction       The results of significant diagnostics from this hospitalization (including imaging, microbiology, ancillary and laboratory) are listed below for reference.    Significant Diagnostic Studies: DG Chest 2 View  Result Date: 10/18/2022 CLINICAL DATA:   Shortness of breath, chest pain, cough EXAM: CHEST - 2 VIEW COMPARISON:  02/28/2022 FINDINGS: Left AICD remains in place, unchanged. Cardiomegaly, vascular congestion. Mild peribronchial thickening and interstitial prominence. No effusions. No acute bony abnormality. IMPRESSION: Cardiomegaly with vascular congestion. Peribronchial thickening and interstitial prominence could reflect bronchitis, less likely interstitial edema. Electronically Signed   By: Rolm Baptise M.D.   On: 10/18/2022 19:16    Microbiology: No results found for this or any previous visit (from the past 240 hour(s)).   Labs: Basic Metabolic Panel: Recent Labs  Lab 10/18/22 1821 10/19/22 0258 10/20/22 0025 10/21/22 0107  NA 137 140 137 138  K 4.0 4.2 3.4* 3.7  CL 103 105 97* 97*  CO2 _0 GLUCOSE 179* 88 113* 104*  BUN 15 22* 25* 30*  CREATININE 1.94* 1.95* 1.80* 2.03*  CALCIUM 8.5* 8.9 9.4 9.0  MG  --  2.1  --   --  Liver Function Tests: No results for input(s): "AST", "ALT", "ALKPHOS", "BILITOT", "PROT", "ALBUMIN" in the last 168 hours. No results for input(s): "LIPASE", "AMYLASE" in the last 168 hours. No results for input(s): "AMMONIA" in the last 168 hours. CBC: Recent Labs  Lab 10/18/22 1821 10/19/22 0258 10/20/22 0025 10/21/22 0107  WBC 7.0 7.7 8.4 8.8  NEUTROABS 4.1  --   --   --   HGB 16.1 16.5 17.0 17.1*  HCT 48.6 48.8 49.0 48.5  MCV 88.0 86.5 84.3 83.2  PLT 199 202 199 204   Cardiac Enzymes: No results for input(s): "CKTOTAL", "CKMB", "CKMBINDEX", "TROPONINI" in the last 168 hours. BNP: BNP (last 3 results) Recent Labs    06/02/22 1527 07/22/22 1452 10/18/22 1821  BNP 649.2* 557.4* 1,494.7*    ProBNP (last 3 results) No results for input(s): "PROBNP" in the last 8760 hours.  CBG: Recent Labs  Lab 10/20/22 0623 10/20/22 1102 10/20/22 1643 10/20/22 2106 10/21/22 0601  GLUCAP 124* 169* 135* 136* 146*       Signed:  Domenic Polite MD.  [facility] 10/21/2022, 11:48 AM

## 2022-10-21 NOTE — Progress Notes (Signed)
   10/21/22 1000  Mobility  Activity Ambulated independently in hallway  Level of Assistance Independent  Assistive Device None  Distance Ambulated (ft) 500 ft  Activity Response Tolerated well  Mobility Referral Yes  $Mobility charge 1 Mobility   Mobility Specialist Progress Note  Received pt in bed having no complaints and agreeable to mobility. Pt was asymptomatic throughout ambulation and returned to room w/o fault. Left EOB w/ call bell in reach and all needs met.   Lucious Groves Mobility Specialist  Please contact via SecureChat or Rehab office at [phone]

## 2022-10-21 NOTE — Plan of Care (Signed)
  Problem: Cardiac: Goal: Ability to achieve and maintain adequate cardiopulmonary perfusion will improve Outcome: Adequate for Discharge   

## 2022-10-21 NOTE — Progress Notes (Signed)
Patient did well overnight, no issues other than a dry cough.

## 2022-10-21 NOTE — Progress Notes (Signed)
Patient is being discharged now by his nurse Sarah. Patient is driving himself home according to his nurse.  Orvan Seen SWOT RN

## 2022-10-25 ENCOUNTER — Inpatient Hospital Stay (HOSPITAL_COMMUNITY)
Admission: EM | Admit: 2022-10-25 | Discharge: 2022-11-19 | DRG: 266 | Disposition: A | Discharge status: Hospice | Payer: Medicaid Other | Attending: Cardiology | Admitting: Cardiology

## 2022-10-25 ENCOUNTER — Other Ambulatory Visit: Payer: Self-pay

## 2022-10-25 ENCOUNTER — Encounter (HOSPITAL_COMMUNITY): Payer: Self-pay

## 2022-10-25 ENCOUNTER — Emergency Department (HOSPITAL_COMMUNITY): Payer: Medicaid Other

## 2022-10-25 ENCOUNTER — Telehealth: Payer: Self-pay

## 2022-10-25 DIAGNOSIS — Z9911 Dependence on respirator [ventilator] status: Secondary | ICD-10-CM | POA: Diagnosis not present

## 2022-10-25 DIAGNOSIS — E871 Hypo-osmolality and hyponatremia: Secondary | ICD-10-CM | POA: Diagnosis present

## 2022-10-25 DIAGNOSIS — E1165 Type 2 diabetes mellitus with hyperglycemia: Secondary | ICD-10-CM | POA: Diagnosis not present

## 2022-10-25 DIAGNOSIS — F17210 Nicotine dependence, cigarettes, uncomplicated: Secondary | ICD-10-CM | POA: Diagnosis present

## 2022-10-25 DIAGNOSIS — F1490 Cocaine use, unspecified, uncomplicated: Secondary | ICD-10-CM | POA: Diagnosis present

## 2022-10-25 DIAGNOSIS — I340 Nonrheumatic mitral (valve) insufficiency: Secondary | ICD-10-CM | POA: Diagnosis present

## 2022-10-25 DIAGNOSIS — I110 Hypertensive heart disease with heart failure: Secondary | ICD-10-CM | POA: Diagnosis not present

## 2022-10-25 DIAGNOSIS — I4729 Other ventricular tachycardia: Secondary | ICD-10-CM | POA: Diagnosis not present

## 2022-10-25 DIAGNOSIS — Z952 Presence of prosthetic heart valve: Secondary | ICD-10-CM | POA: Diagnosis not present

## 2022-10-25 DIAGNOSIS — Z95810 Presence of automatic (implantable) cardiac defibrillator: Secondary | ICD-10-CM | POA: Diagnosis not present

## 2022-10-25 DIAGNOSIS — R569 Unspecified convulsions: Secondary | ICD-10-CM

## 2022-10-25 DIAGNOSIS — N1832 Chronic kidney disease, stage 3b: Secondary | ICD-10-CM | POA: Diagnosis present

## 2022-10-25 DIAGNOSIS — I428 Other cardiomyopathies: Secondary | ICD-10-CM | POA: Diagnosis present

## 2022-10-25 DIAGNOSIS — J9601 Acute respiratory failure with hypoxia: Secondary | ICD-10-CM | POA: Diagnosis not present

## 2022-10-25 DIAGNOSIS — Z006 Encounter for examination for normal comparison and control in clinical research program: Secondary | ICD-10-CM

## 2022-10-25 DIAGNOSIS — I272 Pulmonary hypertension, unspecified: Secondary | ICD-10-CM | POA: Diagnosis present

## 2022-10-25 DIAGNOSIS — Z6835 Body mass index (BMI) 35.0-35.9, adult: Secondary | ICD-10-CM

## 2022-10-25 DIAGNOSIS — E1122 Type 2 diabetes mellitus with diabetic chronic kidney disease: Secondary | ICD-10-CM | POA: Diagnosis present

## 2022-10-25 DIAGNOSIS — N179 Acute kidney failure, unspecified: Secondary | ICD-10-CM | POA: Diagnosis present

## 2022-10-25 DIAGNOSIS — Z66 Do not resuscitate: Secondary | ICD-10-CM | POA: Diagnosis not present

## 2022-10-25 DIAGNOSIS — Z515 Encounter for palliative care: Secondary | ICD-10-CM

## 2022-10-25 DIAGNOSIS — R079 Chest pain, unspecified: Secondary | ICD-10-CM | POA: Diagnosis not present

## 2022-10-25 DIAGNOSIS — I2489 Other forms of acute ischemic heart disease: Secondary | ICD-10-CM | POA: Diagnosis present

## 2022-10-25 DIAGNOSIS — I5023 Acute on chronic systolic (congestive) heart failure: Secondary | ICD-10-CM | POA: Diagnosis present

## 2022-10-25 DIAGNOSIS — Z5900 Homelessness unspecified: Secondary | ICD-10-CM | POA: Diagnosis not present

## 2022-10-25 DIAGNOSIS — Z91199 Patient's noncompliance with other medical treatment and regimen due to unspecified reason: Secondary | ICD-10-CM

## 2022-10-25 DIAGNOSIS — Z01818 Encounter for other preprocedural examination: Secondary | ICD-10-CM | POA: Diagnosis not present

## 2022-10-25 DIAGNOSIS — E876 Hypokalemia: Secondary | ICD-10-CM | POA: Diagnosis not present

## 2022-10-25 DIAGNOSIS — R579 Shock, unspecified: Secondary | ICD-10-CM | POA: Diagnosis not present

## 2022-10-25 DIAGNOSIS — I472 Ventricular tachycardia, unspecified: Secondary | ICD-10-CM | POA: Diagnosis present

## 2022-10-25 DIAGNOSIS — I5084 End stage heart failure: Secondary | ICD-10-CM | POA: Diagnosis present

## 2022-10-25 DIAGNOSIS — Z833 Family history of diabetes mellitus: Secondary | ICD-10-CM

## 2022-10-25 DIAGNOSIS — I5021 Acute systolic (congestive) heart failure: Secondary | ICD-10-CM | POA: Diagnosis not present

## 2022-10-25 DIAGNOSIS — F419 Anxiety disorder, unspecified: Secondary | ICD-10-CM | POA: Diagnosis not present

## 2022-10-25 DIAGNOSIS — I2510 Atherosclerotic heart disease of native coronary artery without angina pectoris: Secondary | ICD-10-CM | POA: Diagnosis not present

## 2022-10-25 DIAGNOSIS — E785 Hyperlipidemia, unspecified: Secondary | ICD-10-CM | POA: Diagnosis present

## 2022-10-25 DIAGNOSIS — Z7982 Long term (current) use of aspirin: Secondary | ICD-10-CM

## 2022-10-25 DIAGNOSIS — R0609 Other forms of dyspnea: Secondary | ICD-10-CM | POA: Diagnosis not present

## 2022-10-25 DIAGNOSIS — Z608 Other problems related to social environment: Secondary | ICD-10-CM | POA: Diagnosis present

## 2022-10-25 DIAGNOSIS — J810 Acute pulmonary edema: Secondary | ICD-10-CM | POA: Diagnosis not present

## 2022-10-25 DIAGNOSIS — F1410 Cocaine abuse, uncomplicated: Secondary | ICD-10-CM | POA: Diagnosis present

## 2022-10-25 DIAGNOSIS — I5043 Acute on chronic combined systolic (congestive) and diastolic (congestive) heart failure: Secondary | ICD-10-CM | POA: Diagnosis not present

## 2022-10-25 DIAGNOSIS — I4891 Unspecified atrial fibrillation: Secondary | ICD-10-CM | POA: Diagnosis present

## 2022-10-25 DIAGNOSIS — R570 Cardiogenic shock: Secondary | ICD-10-CM | POA: Diagnosis not present

## 2022-10-25 DIAGNOSIS — R0600 Dyspnea, unspecified: Secondary | ICD-10-CM | POA: Diagnosis not present

## 2022-10-25 DIAGNOSIS — Z79899 Other long term (current) drug therapy: Secondary | ICD-10-CM

## 2022-10-25 DIAGNOSIS — G479 Sleep disorder, unspecified: Secondary | ICD-10-CM | POA: Diagnosis present

## 2022-10-25 DIAGNOSIS — I509 Heart failure, unspecified: Secondary | ICD-10-CM | POA: Diagnosis not present

## 2022-10-25 DIAGNOSIS — I10 Essential (primary) hypertension: Secondary | ICD-10-CM | POA: Diagnosis present

## 2022-10-25 DIAGNOSIS — D696 Thrombocytopenia, unspecified: Secondary | ICD-10-CM | POA: Diagnosis not present

## 2022-10-25 DIAGNOSIS — R339 Retention of urine, unspecified: Secondary | ICD-10-CM | POA: Diagnosis present

## 2022-10-25 DIAGNOSIS — E669 Obesity, unspecified: Secondary | ICD-10-CM | POA: Diagnosis present

## 2022-10-25 DIAGNOSIS — Z888 Allergy status to other drugs, medicaments and biological substances status: Secondary | ICD-10-CM

## 2022-10-25 DIAGNOSIS — J9690 Respiratory failure, unspecified, unspecified whether with hypoxia or hypercapnia: Secondary | ICD-10-CM | POA: Diagnosis not present

## 2022-10-25 DIAGNOSIS — K219 Gastro-esophageal reflux disease without esophagitis: Secondary | ICD-10-CM | POA: Diagnosis present

## 2022-10-25 DIAGNOSIS — J9811 Atelectasis: Secondary | ICD-10-CM | POA: Diagnosis not present

## 2022-10-25 DIAGNOSIS — N529 Male erectile dysfunction, unspecified: Secondary | ICD-10-CM | POA: Diagnosis present

## 2022-10-25 DIAGNOSIS — I088 Other rheumatic multiple valve diseases: Secondary | ICD-10-CM | POA: Diagnosis not present

## 2022-10-25 DIAGNOSIS — J9600 Acute respiratory failure, unspecified whether with hypoxia or hypercapnia: Secondary | ICD-10-CM | POA: Diagnosis present

## 2022-10-25 DIAGNOSIS — Z8249 Family history of ischemic heart disease and other diseases of the circulatory system: Secondary | ICD-10-CM

## 2022-10-25 DIAGNOSIS — I501 Left ventricular failure: Secondary | ICD-10-CM | POA: Insufficient documentation

## 2022-10-25 DIAGNOSIS — Z794 Long term (current) use of insulin: Secondary | ICD-10-CM

## 2022-10-25 DIAGNOSIS — F064 Anxiety disorder due to known physiological condition: Secondary | ICD-10-CM | POA: Diagnosis present

## 2022-10-25 DIAGNOSIS — N182 Chronic kidney disease, stage 2 (mild): Secondary | ICD-10-CM | POA: Diagnosis present

## 2022-10-25 DIAGNOSIS — I130 Hypertensive heart and chronic kidney disease with heart failure and stage 1 through stage 4 chronic kidney disease, or unspecified chronic kidney disease: Secondary | ICD-10-CM | POA: Diagnosis not present

## 2022-10-25 DIAGNOSIS — J811 Chronic pulmonary edema: Secondary | ICD-10-CM | POA: Diagnosis not present

## 2022-10-25 DIAGNOSIS — I493 Ventricular premature depolarization: Secondary | ICD-10-CM | POA: Diagnosis not present

## 2022-10-25 DIAGNOSIS — F321 Major depressive disorder, single episode, moderate: Secondary | ICD-10-CM | POA: Diagnosis not present

## 2022-10-25 DIAGNOSIS — F329 Major depressive disorder, single episode, unspecified: Secondary | ICD-10-CM | POA: Diagnosis present

## 2022-10-25 DIAGNOSIS — R0602 Shortness of breath: Secondary | ICD-10-CM | POA: Diagnosis not present

## 2022-10-25 DIAGNOSIS — R918 Other nonspecific abnormal finding of lung field: Secondary | ICD-10-CM | POA: Diagnosis not present

## 2022-10-25 DIAGNOSIS — T502X5A Adverse effect of carbonic-anhydrase inhibitors, benzothiadiazides and other diuretics, initial encounter: Secondary | ICD-10-CM | POA: Diagnosis not present

## 2022-10-25 DIAGNOSIS — Z01810 Encounter for preprocedural cardiovascular examination: Secondary | ICD-10-CM | POA: Diagnosis not present

## 2022-10-25 DIAGNOSIS — E875 Hyperkalemia: Secondary | ICD-10-CM | POA: Diagnosis not present

## 2022-10-25 DIAGNOSIS — Z880 Allergy status to penicillin: Secondary | ICD-10-CM

## 2022-10-25 LAB — COMPREHENSIVE METABOLIC PANEL
ALT: 41 U/L (ref 0–44)
AST: 29 U/L (ref 15–41)
Albumin: 3.1 g/dL — ABNORMAL LOW (ref 3.5–5.0)
Alkaline Phosphatase: 69 U/L (ref 38–126)
Anion gap: 13 (ref 5–15)
BUN: 29 mg/dL — ABNORMAL HIGH (ref 6–20)
CO2: 21 mmol/L — ABNORMAL LOW (ref 22–32)
Calcium: 8.7 mg/dL — ABNORMAL LOW (ref 8.9–10.3)
Chloride: 103 mmol/L (ref 98–111)
Creatinine, Ser: 1.96 mg/dL — ABNORMAL HIGH (ref 0.61–1.24)
GFR, Estimated: 41 mL/min — ABNORMAL LOW (ref >60)
Glucose, Bld: 151 mg/dL — ABNORMAL HIGH (ref 70–99)
Potassium: 4.1 mmol/L (ref 3.5–5.1)
Sodium: 137 mmol/L (ref 135–145)
Total Bilirubin: 0.9 mg/dL (ref 0.3–1.2)
Total Protein: 7.2 g/dL (ref 6.5–8.1)

## 2022-10-25 LAB — CBC WITH DIFFERENTIAL/PLATELET
Abs Immature Granulocytes: 0.02 10*3/uL (ref 0.00–0.07)
Basophils Absolute: 0 10*3/uL (ref 0.0–0.1)
Basophils Relative: 0 %
Eosinophils Absolute: 0.1 10*3/uL (ref 0.0–0.5)
Eosinophils Relative: 1 %
HCT: 46 % (ref 39.0–52.0)
Hemoglobin: 16.2 g/dL (ref 13.0–17.0)
Immature Granulocytes: 0 %
Lymphocytes Relative: 33 %
Lymphs Abs: 2.3 10*3/uL (ref 0.7–4.0)
MCH: 29.5 pg (ref 26.0–34.0)
MCHC: 35.2 g/dL (ref 30.0–36.0)
MCV: 83.6 fL (ref 80.0–100.0)
Monocytes Absolute: 0.8 10*3/uL (ref 0.1–1.0)
Monocytes Relative: 12 %
Neutro Abs: 3.7 10*3/uL (ref 1.7–7.7)
Neutrophils Relative %: 54 %
Platelets: 274 10*3/uL (ref 150–400)
RBC: 5.5 MIL/uL (ref 4.22–5.81)
RDW: 12.9 % (ref 11.5–15.5)
WBC: 6.9 10*3/uL (ref 4.0–10.5)
nRBC: 0 % (ref 0.0–0.2)

## 2022-10-25 LAB — CBG MONITORING, ED: Glucose-Capillary: 191 mg/dL — ABNORMAL HIGH (ref 70–99)

## 2022-10-25 LAB — TROPONIN I (HIGH SENSITIVITY)
Troponin I (High Sensitivity): 120 ng/L (ref <18)
Troponin I (High Sensitivity): 153 ng/L (ref <18)

## 2022-10-25 LAB — BRAIN NATRIURETIC PEPTIDE: B Natriuretic Peptide: 1428.2 pg/mL — ABNORMAL HIGH (ref 0.0–100.0)

## 2022-10-25 MED ORDER — DAPAGLIFLOZIN PROPANEDIOL 10 MG PO TABS
10.0000 mg | ORAL_TABLET | Freq: Every day | ORAL | Status: DC
  Administered 2022-10-25 – 2022-11-08 (×14): 10 mg via ORAL
  Filled 2022-10-25 (×17): qty 1

## 2022-10-25 MED ORDER — CARVEDILOL 3.125 MG PO TABS
3.1250 mg | ORAL_TABLET | Freq: Two times a day (BID) | ORAL | Status: DC
  Administered 2022-10-25 – 2022-10-27 (×3): 3.125 mg via ORAL
  Filled 2022-10-25 (×4): qty 1

## 2022-10-25 MED ORDER — ACETAMINOPHEN 325 MG PO TABS
650.0000 mg | ORAL_TABLET | Freq: Four times a day (QID) | ORAL | Status: DC | PRN
  Administered 2022-10-26 – 2022-11-01 (×3): 650 mg via ORAL
  Filled 2022-10-25 (×3): qty 2

## 2022-10-25 MED ORDER — ACETAMINOPHEN 650 MG RE SUPP: 650.0000 mg | Freq: Four times a day (QID) | RECTAL | Status: DC | PRN

## 2022-10-25 MED ORDER — FUROSEMIDE 10 MG/ML IJ SOLN
80.0000 mg | Freq: Once | INTRAMUSCULAR | Status: AC
  Administered 2022-10-25: 80 mg via INTRAVENOUS
  Filled 2022-10-25: qty 8

## 2022-10-25 MED ORDER — GUAIFENESIN-DM 100-10 MG/5ML PO SYRP
15.0000 mL | ORAL_SOLUTION | Freq: Four times a day (QID) | ORAL | Status: DC | PRN
  Administered 2022-10-25 – 2022-10-26 (×3): 15 mL via ORAL
  Administered 2022-10-28 – 2022-10-29 (×2): 5 mL via ORAL
  Administered 2022-10-30 – 2022-11-06 (×13): 15 mL via ORAL
  Filled 2022-10-25 (×18): qty 15

## 2022-10-25 MED ORDER — POLYETHYLENE GLYCOL 3350 17 G PO PACK: 17.0000 g | PACK | Freq: Every day | ORAL | Status: DC | PRN

## 2022-10-25 MED ORDER — ATORVASTATIN CALCIUM 40 MG PO TABS
40.0000 mg | ORAL_TABLET | Freq: Every day | ORAL | Status: DC
  Administered 2022-10-25 – 2022-10-26 (×2): 40 mg via ORAL
  Filled 2022-10-25 (×2): qty 1

## 2022-10-25 MED ORDER — FUROSEMIDE 10 MG/ML IJ SOLN
120.0000 mg | Freq: Two times a day (BID) | INTRAVENOUS | Status: DC
  Filled 2022-10-25 (×3): qty 12

## 2022-10-25 MED ORDER — SODIUM CHLORIDE 0.9% FLUSH
3.0000 mL | Freq: Two times a day (BID) | INTRAVENOUS | Status: DC
  Administered 2022-10-25 – 2022-11-19 (×25): 3 mL via INTRAVENOUS

## 2022-10-25 MED ORDER — ENOXAPARIN SODIUM 60 MG/0.6ML IJ SOSY
60.0000 mg | PREFILLED_SYRINGE | INTRAMUSCULAR | Status: DC
  Administered 2022-10-26 – 2022-11-04 (×11): 60 mg via SUBCUTANEOUS
  Filled 2022-10-25 (×13): qty 0.6

## 2022-10-25 MED ORDER — INSULIN ASPART 100 UNIT/ML IJ SOLN
0.0000 [IU] | Freq: Three times a day (TID) | INTRAMUSCULAR | Status: DC
  Administered 2022-10-26 (×3): 3 [IU] via SUBCUTANEOUS
  Administered 2022-10-27: 2 [IU] via SUBCUTANEOUS
  Administered 2022-10-27: 5 [IU] via SUBCUTANEOUS
  Administered 2022-10-28: 2 [IU] via SUBCUTANEOUS
  Administered 2022-10-28: 5 [IU] via SUBCUTANEOUS
  Administered 2022-10-29: 2 [IU] via SUBCUTANEOUS
  Administered 2022-10-29 – 2022-10-30 (×2): 3 [IU] via SUBCUTANEOUS
  Administered 2022-10-30 – 2022-10-31 (×2): 2 [IU] via SUBCUTANEOUS
  Administered 2022-10-31: 3 [IU] via SUBCUTANEOUS
  Administered 2022-10-31 – 2022-11-01 (×2): 2 [IU] via SUBCUTANEOUS
  Administered 2022-11-01: 3 [IU] via SUBCUTANEOUS
  Administered 2022-11-02: 2 [IU] via SUBCUTANEOUS
  Administered 2022-11-02: 3 [IU] via SUBCUTANEOUS
  Administered 2022-11-03 (×2): 2 [IU] via SUBCUTANEOUS
  Administered 2022-11-04: 3 [IU] via SUBCUTANEOUS
  Administered 2022-11-04 – 2022-11-05 (×2): 2 [IU] via SUBCUTANEOUS
  Administered 2022-11-05: 3 [IU] via SUBCUTANEOUS
  Administered 2022-11-05: 2 [IU] via SUBCUTANEOUS
  Administered 2022-11-06 – 2022-11-07 (×2): 3 [IU] via SUBCUTANEOUS
  Administered 2022-11-08: 2 [IU] via SUBCUTANEOUS

## 2022-10-25 MED ORDER — ALBUTEROL SULFATE HFA 108 (90 BASE) MCG/ACT IN AERS
2.0000 | INHALATION_SPRAY | RESPIRATORY_TRACT | Status: DC | PRN
  Administered 2022-10-25 – 2022-10-26 (×6): 2 via RESPIRATORY_TRACT
  Filled 2022-10-25: qty 6.7

## 2022-10-25 MED ORDER — INSULIN GLARGINE-YFGN 100 UNIT/ML ~~LOC~~ SOLN
15.0000 [IU] | Freq: Every day | SUBCUTANEOUS | Status: DC
  Administered 2022-10-27 – 2022-11-08 (×13): 15 [IU] via SUBCUTANEOUS
  Filled 2022-10-25 (×16): qty 0.15

## 2022-10-25 NOTE — ED Notes (Signed)
MD notified of critical troponin 153

## 2022-10-25 NOTE — H&P (Signed)
History and Physical   Douglas [name] GYK:[id number] DOB: 1976-01-22 DOA: 10/25/2022  PCP: Gildardo Pounds, NP   Patient coming from: Home  Chief Complaint: Shortness of breath  HPI: Douglas [name] is a 46 y.o. male with medical history significant of V. tach status post ICD, seizures, hyperlipidemia, hypertension, diabetes, CKD 2, CHF, depression, DVT, substance use presenting with shortness of breath.  Patient presenting with shortness of breath occurring for the past several couple of days.  Has some worsening edema but not nearly as bad as previous admission.  Reports orthopnea as well.  He was recently admitted for similar symptoms and was discharged 4 days ago.  At discharge creatinine was 2.0, weight was improved to 272, Entresto was held and plan was for close follow-up with heart failure clinic given continued creatinine elevation.  He denies fevers, chills, chest pain, abdominal pain, constipation, diarrhea, nausea, vomiting.  ED Course: Vital signs in the ED stable.  Lab workup included CMP with bicarb 21, BUN 29, creatinine of 1.96 which is up from baseline 1.5, close 1591, calcium 8.7, albumin 3.1.  CBC within normal limits.  Troponin elevated at 120 and then 153 on repeat.  BNP elevated to 1428.  Chest x-ray showed cardiomegaly with prominent pulmonary vasculature and questionable small left pleural effusion.  Patient received 80 mg IV Lasix and dose of albuterol in the ED.  Review of Systems: As per HPI otherwise all other systems reviewed and are negative.  Past Medical History:  Diagnosis Date   Depression, major, single episode 01/14/2017   Diabetes mellitus [facility]    ED (erectile dysfunction)    History of syncope 01/29/2015   Hypertension    ICD (implantable cardioverter-defibrillator) discharge 11/30/2014   On 11/30/14. Asymptomatic.    Nonischemic cardiomyopathy ([place])    a.  echo 4/06: EF 30%, mild to mod MR, mild RAE, inf HK, lat HK , ant HK;    b.  cath 4/06:  no CAD, EF 20-25%   NSVT (nonsustained ventricular tachycardia) (HCC)    Obesity    Systolic CHF, chronic ([place])    EF 11/17 25-30%, s/p ICD    Past Surgical History:  Procedure Laterality Date   CARDIAC CATHETERIZATION  09/2011; 02/2013; 04/2013   CARDIAC DEFIBRILLATOR PLACEMENT  08/23/2013   IMPLANTABLE CARDIOVERTER DEFIBRILLATOR IMPLANT N/A 08/23/2013   Procedure: IMPLANTABLE CARDIOVERTER DEFIBRILLATOR IMPLANT;  Surgeon: Deboraha Sprang, MD;  Location: [facility] CATH LAB;  Service: Cardiovascular;  Laterality: N/A;   LEFT AND RIGHT HEART CATHETERIZATION WITH CORONARY ANGIOGRAM N/A 09/20/2011   Procedure: LEFT AND RIGHT HEART CATHETERIZATION WITH CORONARY ANGIOGRAM;  Surgeon: Jolaine Artist, MD;  Location: [facility] CATH LAB;  Service: Cardiovascular;  Laterality: N/A;   MULTIPLE EXTRACTIONS WITH ALVEOLOPLASTY N/A 01/26/2013   Procedure:  EXTRACION  TOOTH # 19 WITH ALVEOLOPLASTY;  Surgeon: Lenn Cal, DDS;  Location: [place];  Service: Oral Surgery;  Laterality: N/A;   RIGHT HEART CATHETERIZATION N/A 02/22/2013   Procedure: RIGHT HEART CATH;  Surgeon: Jolaine Artist, MD;  Location: [facility] CATH LAB;  Service: Cardiovascular;  Laterality: N/A;   RIGHT HEART CATHETERIZATION N/A 05/03/2013   Procedure: RIGHT HEART CATH;  Surgeon: Jolaine Artist, MD;  Location: [facility] CATH LAB;  Service: Cardiovascular;  Laterality: N/A;   RIGHT/LEFT HEART CATH AND CORONARY ANGIOGRAPHY N/A 03/03/2022   Procedure: RIGHT/LEFT HEART CATH AND CORONARY ANGIOGRAPHY;  Surgeon: Jolaine Artist, MD;  Location: [place] CV LAB;  Service: Cardiovascular;  Laterality: N/A;  Social History  reports that he has been smoking cigarettes. He has a 2.00 pack-year smoking history. He has never used smokeless tobacco. He reports current alcohol use. He reports that he does not currently use drugs after having used the following drugs: Cocaine.  Allergies  Allergen Reactions   Bydureon [Exenatide] Rash   Penicillins Other (See  Comments)    Unknown childhood reaction     Family History  Problem Relation Age of Onset   Coronary artery disease Mother 58       s/p PCI   Lung cancer Father    Diabetes type II Maternal Uncle    Coronary artery disease Maternal Uncle    Stroke Neg Hx    Heart attack Neg Hx   Reviewed on admission  Prior to Admission medications   Medication Sig Start Date End Date Taking? Authorizing Provider  Accu-Chek Softclix Lancets lancets Check blood sugar twice daily E11.8 04/03/21   Charlott Rakes, MD  acetaminophen (TYLENOL) 500 MG tablet Take 1 tablet (500 mg total) by mouth every 6 (six) hours as needed. 04/29/21   Gildardo Pounds, NP  aspirin 81 MG EC tablet Take 1 tablet (81 mg total) by mouth daily. 03/05/22   Lyda Jester M, PA-C  atorvastatin (LIPITOR) 40 MG tablet Take 1 tablet (40 mg total) by mouth daily. 03/05/22 03/05/23  Lyda Jester M, PA-C  Blood Glucose Calibration (ACCU-CHEK GUIDE CONTROL) LIQD 1 each by In Vitro route once as needed for up to 1 dose. 03/30/19   Gildardo Pounds, NP  Blood Glucose Monitoring Suppl (ACCU-CHEK GUIDE ME) w/Device KIT Check blood sugar twice daily E11.8 04/03/21   Charlott Rakes, MD  carvedilol (COREG) 3.125 MG tablet Take 1 tablet (3.125 mg total) by mouth 2 (two) times daily with a meal. 09/22/22   [place], Maricela Bo, FNP  dapagliflozin propanediol (FARXIGA) 10 MG TABS tablet Take 1 tablet (10 mg total) by mouth daily before breakfast. 06/02/22 06/02/23  [place], Maricela Bo, FNP  glucose blood (ACCU-CHEK GUIDE) test strip Check blood sugar twice daily (Must have office visit for refills) 08/09/22   Gildardo Pounds, NP  insulin glargine (LANTUS SOLOSTAR) 100 UNIT/ML Solostar Pen Inject 36 Units into the skin once daily. 08/09/22   Gildardo Pounds, NP  Insulin Pen Needle (BD PEN NEEDLE NANO U/F) 32G X 4 MM MISC INJECT 1 APPLICATION INTO THE SKIN DAILY. 08/24/22   Charlott Rakes, MD  metolazone (ZAROXOLYN) 2.5 MG tablet Take 1 pill every  other Wednesday Patient taking differently: Take 2.5 mg by mouth as directed. Take 1 pill every other Monday 06/02/22 12/17/22  Rafael Bihari, FNP  potassium chloride SA (KLOR-CON M) 20 MEQ tablet Take 1 tablet (20 mEq total) by mouth 2 (two) times daily. 06/21/22   Larey Dresser, MD  torsemide (DEMADEX) 100 MG tablet Take 1 tablet (100 mg total) by mouth in the morning AND 0.5 tablets (50 mg total) every evening. 04/28/22   Rafael Bihari, FNP    Physical Exam: Vitals:   10/25/22 0811 10/25/22 0815 10/25/22 1123 10/25/22 1145  BP: 132/76  134/86 (!) 122/95  Pulse: 95  80 84  Resp: _0 Temp: (!) 97.5 F (36.4 C)  97.7 F (36.5 C)   SpO2: 95%  100% 100%  Weight:  127 kg    Height:  _1  (1.93 m)      Physical Exam Constitutional:      General: He is not in  acute distress.    Appearance: Normal appearance.  HENT:     Head: Normocephalic and atraumatic.     Mouth/Throat:     Mouth: Mucous membranes are moist.     Pharynx: Oropharynx is clear.  Eyes:     Extraocular Movements: Extraocular movements intact.     Pupils: Pupils are equal, round, and reactive to light.  Cardiovascular:     Rate and Rhythm: Normal rate and regular rhythm.     Pulses: Normal pulses.     Heart sounds: Normal heart sounds.  Pulmonary:     Effort: Pulmonary effort is normal. No respiratory distress.  Abdominal:     General: Bowel sounds are normal. There is no distension.     Palpations: Abdomen is soft.     Tenderness: There is no abdominal tenderness.  Musculoskeletal:        General: No swelling or deformity.     Right lower leg: Edema present.     Left lower leg: Edema present.  Skin:    General: Skin is warm and dry.  Neurological:     General: No focal deficit present.     Mental Status: Mental status is at baseline.    Labs on Admission: I have personally reviewed following labs and imaging studies  CBC: Recent Labs  Lab 10/18/22 1821 10/19/22 0258 10/20/22 0025  10/21/22 0107 10/25/22 1442  WBC 7.0 7.7 8.4 8.8 6.9  NEUTROABS 4.1  --   --   --  3.7  HGB 16.1 16.5 17.0 17.1* 16.2  HCT 48.6 48.8 49.0 48.5 46.0  MCV 88.0 86.5 84.3 83.2 83.6  PLT 199 202 199 204 257    Basic Metabolic Panel: Recent Labs  Lab 10/18/22 1821 10/19/22 0258 10/20/22 0025 10/21/22 0107 10/25/22 1442  NA 137 140 137 138 137  K 4.0 4.2 3.4* 3.7 4.1  CL 103 105 97* 97* 103  CO2 _0 21*  GLUCOSE 179* 88 113* 104* 151*  BUN 15 22* 25* 30* 29*  CREATININE 1.94* 1.95* 1.80* 2.03* 1.96*  CALCIUM 8.5* 8.9 9.4 9.0 8.7*  MG  --  2.1  --   --   --     GFR: Estimated Creatinine Clearance: 67.1 mL/min (A) (by C-G formula based on SCr of 1.96 mg/dL (H)).  Liver Function Tests: Recent Labs  Lab 10/25/22 1442  AST 29  ALT 41  ALKPHOS 69  BILITOT 0.9  PROT 7.2  ALBUMIN 3.1*    Urine analysis:    Component Value Date/Time   COLORURINE STRAW (A) 06/11/2020 1423   APPEARANCEUR CLEAR 06/11/2020 1423   LABSPEC <=1.005 06/26/2021 1229   PHURINE 6.0 06/26/2021 1229   GLUCOSEU 250 (A) 06/26/2021 1229   HGBUR TRACE (A) 06/26/2021 1229   BILIRUBINUR NEGATIVE 06/26/2021 1229   KETONESUR NEGATIVE 06/26/2021 1229   PROTEINUR NEGATIVE 06/26/2021 1229   UROBILINOGEN 0.2 06/26/2021 1229   NITRITE NEGATIVE 06/26/2021 1229   LEUKOCYTESUR NEGATIVE 06/26/2021 1229    Radiological Exams on Admission: DG Chest 2 View  Result Date: 10/25/2022 CLINICAL DATA:  Shortness of breath EXAM: CHEST - 2 VIEW COMPARISON:  Previous studies including the examination of 10/18/2022 FINDINGS: Transverse diameter of heart is increased. Central pulmonary vessels are more prominent. Subtle increased markings are seen in parahilar regions. There is no focal consolidation. There is minimal blunting of left lateral CP angle. There is no pneumothorax. Pacemaker/defibrillator battery is seen in left infraclavicular region. IMPRESSION: Cardiomegaly. Central pulmonary vessels are more  prominent  suggesting CHF. Possible small left pleural effusion. Electronically Signed   By: Elmer Picker M.D.   On: 10/25/2022 09:05    EKG: Independently reviewed.  Sinus rhythm at 81 bpm.  PVC noted.  Low voltage multiple leads.  Nonspecific intraventricular conduction delay with QRS of 100, ?Early/incomplete LBBB.  Nonspecific ST changes.  Assessment/Plan Active Problems:   Diabetes mellitus with stage 2 chronic kidney disease (HCC)   Hyperlipidemia   Essential hypertension   Chronic combined systolic and diastolic congestive heart failure (HCC)   VT (ventricular tachycardia) ([place])   ICD (MDT) in place   Depression, major, single episode   Seizure ([place])   Acute on chronic systolic CHF (congestive heart failure) (HCC)   Acute on chronic systolic CHF > Patient with known history of CHF with last echo earlier this year showing EF of 25-30%, global hypokinesis, and moderately reduced RV function.  Status post AICD > Was recently admitted for similar symptoms and treated for CHF exacerbation, discharged 4 days ago.  On discharge creatinine was 2.0, weight was 272 which is now up to 279.  Plan was for close follow-up with heart failure clinic but he has not made it to that appointment yet.  Entresto held on discharge. > Since discharge may be history, little too much fluid but otherwise has been trying to follow instructions.  Volume overload is less significant than previous admit, hopefully shorter day this admission.  > Patient received 80 mg IV Lasix in the ED. - Monitor on telemetry - Increase Lasix dose to 120 mg IV twice daily - Strict I's and O's, daily weights - Repeat echocardiogram - Check magnesium - Trend renal function and electrolytes - Continue home carvedilol, Farxiga  AKI > Present during recent admission.  Creatinine currently at 1.96 was around 2 on discharge as well.  Baseline of 1.5. > Continue to monitor response to diuresis above. - Trend renal function and  electrolytes  History of V. Tach > Status post AICD - Continue home amiodarone  Hyperlipidemia - Continue home atorvastatin  Hypertension - On IV Lasix and carvedilol as above  Diabetes > 36 units daily at home. - 15 units daily - SSI - Continue home Farxiga  Substance use > History of cocaine use and has not used in several months.  DVT prophylaxis: Lovenox  Code Status:   Full Family Communication:  None on admission.  He has been in touch with his family. Disposition Plan:   Patient is from:  Home  Anticipated DC to:  Home  Anticipated DC date:  2 to 5 days  Anticipated DC barriers: None  Consults called:  None Admission status:  Inpatient, telemetry  Severity of Illness: The appropriate patient status for this patient is INPATIENT. Inpatient status is judged to be reasonable and necessary in order to provide the required intensity of service to ensure the patient's safety. The patient's presenting symptoms, physical exam findings, and initial radiographic and laboratory data in the context of their chronic comorbidities is felt to place them at high risk for further clinical deterioration. Furthermore, it is not anticipated that the patient will be medically stable for discharge from the hospital within 2 midnights of admission.   * I certify that at the point of admission it is my clinical judgment that the patient will require inpatient hospital care spanning beyond 2 midnights from the point of admission due to high intensity of service, high risk for further deterioration and high frequency of surveillance required.*  Marcelyn Bruins MD [facility]  How to contact the [facility] Attending or Consulting provider [place] or covering provider during after hours [place], for this patient?   Check the care team in [facility] and look for a) attending/consulting [facility] provider listed and b) the [facility] team listed Log into [email or web address] and use 's universal password to  access. If you do not have the password, please contact the hospital operator. Locate the [facility] provider you are looking for under [facility] and page to a number that you can be directly reached. If you still have difficulty reaching the provider, please page the [facility] (Director on Call) for the Hospitalists listed on amion for assistance.  10/25/2022, 6:02 PM

## 2022-10-25 NOTE — ED Notes (Signed)
Patient appears to have a dry persistent cough. Cough is causing the patient to become uncomfortably and more short of breath. PRN medications have been given with no relief.

## 2022-10-25 NOTE — Telephone Encounter (Signed)
Transition Care Management Follow-up Telephone Call Date of discharge and from where: 10/21/2022, [facility].   Patient currently in ED.

## 2022-10-25 NOTE — ED Provider Notes (Signed)
[place] EMERGENCY DEPARTMENT Provider Note  CSN: [id number] Arrival date & time: 10/25/22 [id number]  Chief Complaint(s) Shortness of Breath  [name] is a 46 y.o. male with PMH nonischemic cardiomyopathy with EF 25 to 30% with AICD in place, T2DM, HTN, CHF with recent hospital admission and discharge on 10/21/2022 for CHF exacerbation who presents emergency department for evaluation of persistent shortness of breath.  He states that he has not been feeling well since discharge and his shortness of breath significantly worsened last night.  He states that he frequently awakens from sleep with feelings of a need for urination and gasping for air.  He is endorsing persistent shortness of breath on exertion and states that he did take half of his torsemide dose this morning but has not had any improvement.  Denies chest pain, abdominal pain, nausea, vomiting or other systemic symptoms.  Denies nocturnal hematuria.   Past Medical History Past Medical History:  Diagnosis Date   Depression, major, single episode 01/14/2017   Diabetes mellitus [facility])    ED (erectile dysfunction)    History of syncope 01/29/2015   Hypertension    ICD (implantable cardioverter-defibrillator) discharge 11/30/2014   On 11/30/14. Asymptomatic.    Nonischemic cardiomyopathy ([place])    a.  echo 4/06: EF 30%, mild to mod MR, mild RAE, inf HK, lat HK , ant HK;    b.  cath 4/06: no CAD, EF 20-25%   NSVT (nonsustained ventricular tachycardia) (HCC)    Obesity    Systolic CHF, chronic (HCC)    EF 11/17 25-30%, s/p ICD   Patient Active Problem List   Diagnosis Date Noted   Acute on chronic systolic CHF (congestive heart failure) ([place]) 10/25/2022   Acute renal failure superimposed on stage 2 chronic kidney disease ([place]) 10/18/2022   Non-occlusive coronary artery disease 10/18/2022   Nonspecific syndrome suggestive of viral illness 07/26/2022   Smoker 07/26/2022   Acute embolism and thrombosis of  deep vein of lower extremity, unspecified laterality ([place]) 03/31/2022   Seizure ([place]) 03/31/2022   Onychomycosis 01/16/2017   Depression, major, single episode 01/14/2017   Allergic rhinitis 04/28/2015   Chest pain 04/21/2015   Prolonged Q-T interval on ECG 01/31/2015   ICD (MDT) in place 01/29/2015   Elevated troponin 01/29/2015   Obesity 01/29/2015   Healthcare maintenance 12/11/2014   Cocaine use 06/23/2012   Tobacco use 09/20/2011   VT (ventricular tachycardia) ([place]) 09/19/2011   ED (erectile dysfunction) 08/02/2011   Chronic combined systolic and diastolic congestive heart failure ([place]) 06/21/2011   Diabetes mellitus with stage 2 chronic kidney disease ([place]) 04/28/2009   Hyperlipidemia 04/28/2009   Essential hypertension 04/28/2009   Home Medication(s) Prior to Admission medications   Medication Sig Start Date End Date Taking? Authorizing Provider  acetaminophen (TYLENOL) 500 MG tablet Take 1 tablet (500 mg total) by mouth every 6 (six) hours as needed. Patient taking differently: Take 1,000 mg by mouth every 6 (six) hours as needed for mild pain, moderate pain, fever or headache. 04/29/21  Yes Gildardo Pounds, NP  atorvastatin (LIPITOR) 40 MG tablet Take 1 tablet (40 mg total) by mouth daily. Patient taking differently: Take 40 mg by mouth in the morning. 03/05/22 03/05/23 Yes Rosita Fire, Brittainy M, PA-C  carvedilol (COREG) 3.125 MG tablet Take 1 tablet (3.125 mg total) by mouth 2 (two) times daily with a meal. 09/22/22  Yes [place], Maricela Bo, FNP  dapagliflozin propanediol (FARXIGA) 10 MG TABS tablet Take 1 tablet (10  mg total) by mouth daily before breakfast. Patient taking differently: Take 10 mg by mouth in the morning. 06/02/22 06/02/23 Yes [place], Maricela Bo, FNP  insulin glargine (LANTUS SOLOSTAR) 100 UNIT/ML Solostar Pen Inject 36 Units into the skin once daily. Patient taking differently: Inject 36 Units into the skin in the morning. 08/09/22  Yes Gildardo Pounds, NP   metolazone (ZAROXOLYN) 2.5 MG tablet Take 1 pill every other Wednesday Patient taking differently: Take 2.5 mg by mouth as directed. 2.5 mg every other Monday as needed for fluid 06/02/22 12/17/22 Yes [place], [place], FNP  potassium chloride SA (KLOR-CON M) 20 MEQ tablet Take 1 tablet (20 mEq total) by mouth 2 (two) times daily. 06/21/22  Yes Larey Dresser, MD  torsemide (DEMADEX) 100 MG tablet Take 1 tablet (100 mg total) by mouth in the morning AND 0.5 tablets (50 mg total) every evening. Patient taking differently: 100 mg every morning, 50 mg every evening 04/28/22  Yes [place], Maricela Bo, FNP  aspirin 81 MG EC tablet Take 1 tablet (81 mg total) by mouth daily. Patient not taking: Reported on 10/25/2022 03/05/22   Lyda Jester M, PA-C  Insulin Pen Needle (BD PEN NEEDLE NANO U/F) 32G X 4 MM MISC INJECT 1 APPLICATION INTO THE SKIN DAILY. 08/24/22   Charlott Rakes, MD                                                                                                                                    Past Surgical History Past Surgical History:  Procedure Laterality Date   CARDIAC CATHETERIZATION  09/2011; 02/2013; 04/2013   CARDIAC DEFIBRILLATOR PLACEMENT  08/23/2013   IMPLANTABLE CARDIOVERTER DEFIBRILLATOR IMPLANT N/A 08/23/2013   Procedure: IMPLANTABLE CARDIOVERTER DEFIBRILLATOR IMPLANT;  Surgeon: Deboraha Sprang, MD;  Location: [facility] CATH LAB;  Service: Cardiovascular;  Laterality: N/A;   LEFT AND RIGHT HEART CATHETERIZATION WITH CORONARY ANGIOGRAM N/A 09/20/2011   Procedure: LEFT AND RIGHT HEART CATHETERIZATION WITH CORONARY ANGIOGRAM;  Surgeon: Jolaine Artist, MD;  Location: [facility] CATH LAB;  Service: Cardiovascular;  Laterality: N/A;   MULTIPLE EXTRACTIONS WITH ALVEOLOPLASTY N/A 01/26/2013   Procedure:  EXTRACION  TOOTH # 19 WITH ALVEOLOPLASTY;  Surgeon: Lenn Cal, DDS;  Location: [facility];  Service: Oral Surgery;  Laterality: N/A;   RIGHT HEART CATHETERIZATION N/A 02/22/2013   Procedure:  RIGHT HEART CATH;  Surgeon: Jolaine Artist, MD;  Location: [facility] CATH LAB;  Service: Cardiovascular;  Laterality: N/A;   RIGHT HEART CATHETERIZATION N/A 05/03/2013   Procedure: RIGHT HEART CATH;  Surgeon: Jolaine Artist, MD;  Location: [facility] CATH LAB;  Service: Cardiovascular;  Laterality: N/A;   RIGHT/LEFT HEART CATH AND CORONARY ANGIOGRAPHY N/A 03/03/2022   Procedure: RIGHT/LEFT HEART CATH AND CORONARY ANGIOGRAPHY;  Surgeon: Jolaine Artist, MD;  Location: [facility] CV LAB;  Service: Cardiovascular;  Laterality: N/A;   Family History Family History  Problem Relation Age of Onset  Coronary artery disease Mother 44       s/p PCI   Lung cancer Father    Diabetes type II Maternal Uncle    Coronary artery disease Maternal Uncle    Stroke Neg Hx    Heart attack Neg Hx     Social History Social History   Tobacco Use   Smoking status: Every Day    Packs/day: 0.25    Years: 8.00    Total pack years: 2.00    Types: Cigarettes   Smokeless tobacco: Never   Tobacco comments:    ~3 cigarettes a day  Vaping Use   Vaping Use: Never used  Substance Use Topics   Alcohol use: Yes    Alcohol/week: 0.0 standard drinks of alcohol    Comment: 2x week.    Drug use: Not Currently    Types: Cocaine    Comment: 08/10/17 - last use one week ago   Allergies Bydureon [exenatide] and Penicillins  Review of Systems Review of Systems  Respiratory:  Positive for shortness of breath.   Cardiovascular:  Positive for leg swelling.    Physical Exam Vital Signs  I have reviewed the triage vital signs BP (!) 122/95   Pulse 84   Temp 97.7 F (36.5 C)   Resp 16   Ht 6\' 4"  (1.93 m)   Wt 127 kg   SpO2 100%   BMI 34.08 kg/m   Physical Exam Constitutional:      General: He is not in acute distress.    Appearance: Normal appearance.  HENT:     Head: Normocephalic and atraumatic.     Nose: No congestion or rhinorrhea.  Eyes:     General:        Right eye: No discharge.        Left  eye: No discharge.     Extraocular Movements: Extraocular movements intact.     Pupils: Pupils are equal, round, and reactive to light.  Cardiovascular:     Rate and Rhythm: Normal rate and regular rhythm.     Heart sounds: No murmur heard. Pulmonary:     Effort: No respiratory distress.     Breath sounds: Rales present. No wheezing.  Abdominal:     General: There is no distension.     Tenderness: There is no abdominal tenderness.  Musculoskeletal:        General: Normal range of motion.     Cervical back: Normal range of motion.     Right lower leg: Edema present.     Left lower leg: Edema present.  Skin:    General: Skin is warm and dry.  Neurological:     General: No focal deficit present.     Mental Status: He is alert.     ED Results and Treatments Labs (all labs ordered are listed, but only abnormal results are displayed) Labs Reviewed  COMPREHENSIVE METABOLIC PANEL - Abnormal; Notable for the following components:      Result Value   CO2 21 (*)    Glucose, Bld 151 (*)    BUN 29 (*)    Creatinine, Ser 1.96 (*)    Calcium 8.7 (*)    Albumin 3.1 (*)    GFR, Estimated 41 (*)    All other components within normal limits  BRAIN NATRIURETIC PEPTIDE - Abnormal; Notable for the following components:   B Natriuretic Peptide 1,428.2 (*)    All other components within normal limits  TROPONIN I (HIGH SENSITIVITY) - Abnormal;  Notable for the following components:   Troponin I (High Sensitivity) 120 (*)    All other components within normal limits  TROPONIN I (HIGH SENSITIVITY) - Abnormal; Notable for the following components:   Troponin I (High Sensitivity) 153 (*)    All other components within normal limits  CBC WITH DIFFERENTIAL/PLATELET  MAGNESIUM  CBC  COMPREHENSIVE METABOLIC PANEL                                                                                                                          Radiology DG Chest 2 View  Result Date: 10/25/2022 CLINICAL  DATA:  Shortness of breath EXAM: CHEST - 2 VIEW COMPARISON:  Previous studies including the examination of 10/18/2022 FINDINGS: Transverse diameter of heart is increased. Central pulmonary vessels are more prominent. Subtle increased markings are seen in parahilar regions. There is no focal consolidation. There is minimal blunting of left lateral CP angle. There is no pneumothorax. Pacemaker/defibrillator battery is seen in left infraclavicular region. IMPRESSION: Cardiomegaly. Central pulmonary vessels are more prominent suggesting CHF. Possible small left pleural effusion. Electronically Signed   By: Elmer Picker M.D.   On: 10/25/2022 09:05    Pertinent labs & imaging results that were available during my care of the patient were reviewed by me and considered in my medical decision making (see MDM for details).  Medications Ordered in ED Medications  albuterol (VENTOLIN HFA) 108 (90 Base) MCG/ACT inhaler 2 puff (2 puffs Inhalation Given 10/25/22 1448)  carvedilol (COREG) tablet 3.125 mg (has no administration in time range)  atorvastatin (LIPITOR) tablet 40 mg (has no administration in time range)  dapagliflozin propanediol (FARXIGA) tablet 10 mg (has no administration in time range)  furosemide (LASIX) 120 mg in dextrose 5 % 50 mL IVPB (has no administration in time range)  enoxaparin (LOVENOX) injection 60 mg (has no administration in time range)  sodium chloride flush (NS) 0.9 % injection 3 mL (has no administration in time range)  acetaminophen (TYLENOL) tablet 650 mg (has no administration in time range)    Or  acetaminophen (TYLENOL) suppository 650 mg (has no administration in time range)  polyethylene glycol (MIRALAX / GLYCOLAX) packet 17 g (has no administration in time range)  insulin glargine-yfgn (SEMGLEE) injection 15 Units (has no administration in time range)  insulin aspart (novoLOG) injection 0-15 Units (has no administration in time range)  furosemide (LASIX) injection  80 mg (80 mg Intravenous Given 10/25/22 1448)  Procedures Procedures  (including critical care time)  Medical Decision Making / ED Course   This patient presents to the ED for concern of shortness of breath, this involves an extensive number of treatment options, and is a complaint that carries with it a high risk of complications and morbidity.  The differential diagnosis includes CHF exacerbation, pneumonia, ACS, PE, medication noncompliance  MDM: Patient seen emerged part for evaluation of shortness of breath.  Physical exam with rales at the bases, mild bilateral lower extremity edema but is otherwise unremarkable.  Chest x-ray with CHF and fluid overload.  80 of Lasix initiated and at time of signout, patient is pending laboratory valuation.  Please see provider assignment of continuation of workup.  Anticipate admission.   Additional history obtained:  -External records from outside source obtained and reviewed including: Chart review including previous notes, labs, imaging, consultation notes   Lab Tests: -I ordered, reviewed, and interpreted labs.   The pertinent results include:   Labs Reviewed  COMPREHENSIVE METABOLIC PANEL - Abnormal; Notable for the following components:      Result Value   CO2 21 (*)    Glucose, Bld 151 (*)    BUN 29 (*)    Creatinine, Ser 1.96 (*)    Calcium 8.7 (*)    Albumin 3.1 (*)    GFR, Estimated 41 (*)    All other components within normal limits  BRAIN NATRIURETIC PEPTIDE - Abnormal; Notable for the following components:   B Natriuretic Peptide 1,428.2 (*)    All other components within normal limits  TROPONIN I (HIGH SENSITIVITY) - Abnormal; Notable for the following components:   Troponin I (High Sensitivity) 120 (*)    All other components within normal limits  TROPONIN I (HIGH SENSITIVITY) - Abnormal;  Notable for the following components:   Troponin I (High Sensitivity) 153 (*)    All other components within normal limits  CBC WITH DIFFERENTIAL/PLATELET  MAGNESIUM  CBC  COMPREHENSIVE METABOLIC PANEL      EKG   EKG Interpretation  Date/Time:  Monday October 25 2022 08:14:31 EST Ventricular Rate:  81 PR Interval:  174 QRS Duration: 100 QT Interval:  416 QTC Calculation: 483 R Axis:   28 Text Interpretation: Sinus rhythm with sinus arrhythmia with occasional Premature ventricular complexes Nonspecific ST and T wave abnormality Abnormal ECG When compared with ECG of 18-Oct-2022 18:16, PREVIOUS ECG IS PRESENT Confirmed by [place] ([id number]) on 10/25/2022 8:18:37 PM         Imaging Studies ordered: I ordered imaging studies including CXR I independently visualized and interpreted imaging. I agree with the radiologist interpretation   Medicines ordered and prescription drug management: Meds ordered this encounter  Medications   albuterol (VENTOLIN HFA) 108 (90 Base) MCG/ACT inhaler 2 puff   furosemide (LASIX) injection 80 mg   carvedilol (COREG) tablet 3.125 mg   atorvastatin (LIPITOR) tablet 40 mg   dapagliflozin propanediol (FARXIGA) tablet 10 mg   furosemide (LASIX) 120 mg in dextrose 5 % 50 mL IVPB   enoxaparin (LOVENOX) injection 60 mg   sodium chloride flush (NS) 0.9 % injection 3 mL   OR Linked Order Group    acetaminophen (TYLENOL) tablet 650 mg    acetaminophen (TYLENOL) suppository 650 mg   polyethylene glycol (MIRALAX / GLYCOLAX) packet 17 g   insulin glargine-yfgn (SEMGLEE) injection 15 Units   insulin aspart (novoLOG) injection 0-15 Units    Order Specific Question:   Correction coverage:    Answer:  Moderate (average weight, post-op)    Order Specific Question:   CBG < 70:    Answer:   Implement Hypoglycemia Standing Orders and refer to Hypoglycemia Standing Orders sidebar report    Order Specific Question:   CBG 70 - 120:    Answer:   0 units     Order Specific Question:   CBG 121 - 150:    Answer:   2 units    Order Specific Question:   CBG 151 - 200:    Answer:   3 units    Order Specific Question:   CBG 201 - 250:    Answer:   5 units    Order Specific Question:   CBG 251 - 300:    Answer:   8 units    Order Specific Question:   CBG 301 - 350:    Answer:   11 units    Order Specific Question:   CBG 351 - 400:    Answer:   15 units    Order Specific Question:   CBG > 400    Answer:   call MD and obtain STAT lab verification    -I have reviewed the patients home medicines and have made adjustments as needed  Critical interventions none  Cardiac Monitoring: The patient was maintained on a cardiac monitor.  I personally viewed and interpreted the cardiac monitored which showed an underlying rhythm of: NSR  Social Determinants of Health:  Factors impacting patients care include: none   Reevaluation: After the interventions noted above, I reevaluated the patient and found that they have :stayed the same  Co morbidities that complicate the patient evaluation  Past Medical History:  Diagnosis Date   Depression, major, single episode 01/14/2017   Diabetes mellitus [facility])    ED (erectile dysfunction)    History of syncope 01/29/2015   Hypertension    ICD (implantable cardioverter-defibrillator) discharge 11/30/2014   On 11/30/14. Asymptomatic.    Nonischemic cardiomyopathy ([place])    a.  echo 4/06: EF 30%, mild to mod MR, mild RAE, inf HK, lat HK , ant HK;    b.  cath 4/06: no CAD, EF 20-25%   NSVT (nonsustained ventricular tachycardia) (HCC)    Obesity    Systolic CHF, chronic ([place])    EF 11/17 25-30%, s/p ICD      Dispostion: I considered admission for this patient, and disposition pending laboratory evaluation.  Anticipate admission.     Final Clinical Impression(s) / ED Diagnoses Final diagnoses:  Acute on chronic congestive heart failure, unspecified heart failure type [facility])     @PCDICTATION @    Teressa Lower, MD 10/25/22 2018

## 2022-10-25 NOTE — ED Notes (Signed)
This RN called lab to see if they received blood specimens for this patient. Per lab, specimens not received. New specimens drawn and sent by this RN.

## 2022-10-25 NOTE — ED Triage Notes (Signed)
Pt arrived POV from home c/o [facility] that started last night. Pt also endorses back pain.

## 2022-10-25 NOTE — ED Notes (Signed)
Pt given food and drink per MD 

## 2022-10-25 NOTE — ED Provider Notes (Signed)
  Physical Exam  BP (!) 122/95   Pulse 84   Temp 97.7 F (36.5 C)   Resp 16   Ht 6\' 4"  (1.93 m)   Wt 127 kg   SpO2 100%   BMI 34.08 kg/m   Physical Exam  Procedures  Procedures  ED Course / MDM    Medical Decision Making Amount and/or Complexity of Data Reviewed Labs: ordered. Radiology: ordered.  Risk Prescription drug management.    Received patient in signout.  Shortness of breath.  Recent admission for CHF.  Similar symptoms now.  BNP elevated.  Troponin elevated.  X-ray shows volume overload.  Given 80 of Lasix but still feeling dyspneic.  Will admit.      , MD 10/25/22 1736

## 2022-10-26 ENCOUNTER — Inpatient Hospital Stay: Payer: Self-pay

## 2022-10-26 ENCOUNTER — Other Ambulatory Visit (HOSPITAL_COMMUNITY): Payer: Self-pay

## 2022-10-26 ENCOUNTER — Inpatient Hospital Stay (HOSPITAL_COMMUNITY): Payer: Medicaid Other

## 2022-10-26 ENCOUNTER — Encounter (HOSPITAL_COMMUNITY): Payer: Self-pay

## 2022-10-26 ENCOUNTER — Encounter (HOSPITAL_COMMUNITY): Payer: Medicaid Other

## 2022-10-26 DIAGNOSIS — I5021 Acute systolic (congestive) heart failure: Secondary | ICD-10-CM

## 2022-10-26 DIAGNOSIS — I5023 Acute on chronic systolic (congestive) heart failure: Secondary | ICD-10-CM | POA: Diagnosis not present

## 2022-10-26 LAB — COMPREHENSIVE METABOLIC PANEL
ALT: 36 U/L (ref 0–44)
AST: 21 U/L (ref 15–41)
Albumin: 3 g/dL — ABNORMAL LOW (ref 3.5–5.0)
Alkaline Phosphatase: 65 U/L (ref 38–126)
Anion gap: 11 (ref 5–15)
BUN: 26 mg/dL — ABNORMAL HIGH (ref 6–20)
CO2: 21 mmol/L — ABNORMAL LOW (ref 22–32)
Calcium: 8.7 mg/dL — ABNORMAL LOW (ref 8.9–10.3)
Chloride: 105 mmol/L (ref 98–111)
Creatinine, Ser: 1.75 mg/dL — ABNORMAL HIGH (ref 0.61–1.24)
GFR, Estimated: 47 mL/min — ABNORMAL LOW (ref >60)
Glucose, Bld: 149 mg/dL — ABNORMAL HIGH (ref 70–99)
Potassium: 3.5 mmol/L (ref 3.5–5.1)
Sodium: 137 mmol/L (ref 135–145)
Total Bilirubin: 1.1 mg/dL (ref 0.3–1.2)
Total Protein: 6.8 g/dL (ref 6.5–8.1)

## 2022-10-26 LAB — CBG MONITORING, ED
Glucose-Capillary: 161 mg/dL — ABNORMAL HIGH (ref 70–99)
Glucose-Capillary: 177 mg/dL — ABNORMAL HIGH (ref 70–99)
Glucose-Capillary: 163 mg/dL — ABNORMAL HIGH (ref 70–99)

## 2022-10-26 LAB — RESPIRATORY PANEL BY PCR

## 2022-10-26 LAB — CBC
HCT: 47 % (ref 39.0–52.0)
Hemoglobin: 15.7 g/dL (ref 13.0–17.0)
MCH: 28.9 pg (ref 26.0–34.0)
MCHC: 33.4 g/dL (ref 30.0–36.0)
MCV: 86.4 fL (ref 80.0–100.0)
Platelets: 271 10*3/uL (ref 150–400)
RBC: 5.44 MIL/uL (ref 4.22–5.81)
RDW: 13 % (ref 11.5–15.5)
WBC: 8.1 10*3/uL (ref 4.0–10.5)
nRBC: 0 % (ref 0.0–0.2)

## 2022-10-26 LAB — GLUCOSE, CAPILLARY: Glucose-Capillary: 178 mg/dL — ABNORMAL HIGH (ref 70–99)

## 2022-10-26 LAB — ECHOCARDIOGRAM COMPLETE
Area-P 1/2: 5.88 cm2
Height: 76 in
MV M vel: 4.9 m/s
MV Peak grad: 96 mmHg
MV VTI: 1.61 cm2
Radius: 0.7 cm
S' Lateral: 6.6 cm
Weight: 4480 oz

## 2022-10-26 MED ORDER — AMIODARONE HCL 200 MG PO TABS: 400.0000 mg | ORAL_TABLET | Freq: Two times a day (BID) | ORAL | Status: DC

## 2022-10-26 MED ORDER — POTASSIUM CHLORIDE CRYS ER 20 MEQ PO TBCR
40.0000 meq | EXTENDED_RELEASE_TABLET | Freq: Two times a day (BID) | ORAL | Status: AC
  Administered 2022-10-26 (×2): 40 meq via ORAL
  Filled 2022-10-26 (×2): qty 2

## 2022-10-26 MED ORDER — FUROSEMIDE 10 MG/ML IJ SOLN
80.0000 mg | Freq: Two times a day (BID) | INTRAMUSCULAR | Status: DC
  Administered 2022-10-26 – 2022-10-27 (×4): 80 mg via INTRAVENOUS
  Filled 2022-10-26 (×4): qty 8

## 2022-10-26 MED ORDER — MAGNESIUM SULFATE 2 GM/50ML IV SOLN
2.0000 g | Freq: Once | INTRAVENOUS | Status: AC
  Administered 2022-10-26: 2 g via INTRAVENOUS
  Filled 2022-10-26: qty 50

## 2022-10-26 MED ORDER — HYDROCODONE BIT-HOMATROP MBR 5-1.5 MG/5ML PO SOLN
5.0000 mL | Freq: Once | ORAL | Status: AC
  Administered 2022-10-26: 5 mL via ORAL
  Filled 2022-10-26: qty 5

## 2022-10-26 MED ORDER — METOLAZONE 5 MG PO TABS
5.0000 mg | ORAL_TABLET | Freq: Once | ORAL | Status: AC
  Administered 2022-10-26: 5 mg via ORAL
  Filled 2022-10-26 (×2): qty 1

## 2022-10-26 MED ORDER — ALBUTEROL SULFATE (2.5 MG/3ML) 0.083% IN NEBU: 2.5000 mg | INHALATION_SOLUTION | RESPIRATORY_TRACT | Status: DC | PRN

## 2022-10-26 MED ORDER — ATORVASTATIN CALCIUM 80 MG PO TABS
80.0000 mg | ORAL_TABLET | Freq: Every day | ORAL | Status: DC
  Administered 2022-10-27 – 2022-11-08 (×13): 80 mg via ORAL
  Filled 2022-10-26 (×13): qty 1

## 2022-10-26 MED ORDER — POTASSIUM CHLORIDE CRYS ER 20 MEQ PO TBCR
40.0000 meq | EXTENDED_RELEASE_TABLET | Freq: Once | ORAL | Status: AC
  Administered 2022-10-26: 40 meq via ORAL
  Filled 2022-10-26: qty 2

## 2022-10-26 MED ORDER — ASPIRIN 81 MG PO CHEW
81.0000 mg | CHEWABLE_TABLET | Freq: Every day | ORAL | Status: DC
  Administered 2022-10-26 – 2022-11-08 (×14): 81 mg via ORAL
  Filled 2022-10-26 (×15): qty 1

## 2022-10-26 MED ORDER — MELATONIN 5 MG PO TABS
10.0000 mg | ORAL_TABLET | Freq: Every evening | ORAL | Status: DC | PRN
  Administered 2022-10-27 – 2022-11-08 (×8): 10 mg via ORAL
  Filled 2022-10-26 (×10): qty 2

## 2022-10-26 MED ORDER — ALPRAZOLAM 0.5 MG PO TABS
0.5000 mg | ORAL_TABLET | Freq: Two times a day (BID) | ORAL | Status: DC | PRN
  Administered 2022-10-26 – 2022-11-08 (×19): 0.5 mg via ORAL
  Filled 2022-10-26: qty 2
  Filled 2022-10-26 (×4): qty 1
  Filled 2022-10-26: qty 2
  Filled 2022-10-26 (×15): qty 1

## 2022-10-26 MED ORDER — HYDRALAZINE HCL 10 MG PO TABS
10.0000 mg | ORAL_TABLET | Freq: Three times a day (TID) | ORAL | Status: DC
  Administered 2022-10-26 – 2022-10-27 (×3): 10 mg via ORAL
  Filled 2022-10-26 (×3): qty 1

## 2022-10-26 NOTE — Progress Notes (Signed)
Heart Failure Navigator Progress Note  Assessed for Heart & Vascular TOC clinic readiness.  Patient does not meet criteria due to Advanced Heart Failure Team patient of Dr. Bensimhon.   Navigator will sign off at this time.   Mahira Petras, BSN, RN Heart Failure Nurse Navigator Secure Chat Only   

## 2022-10-26 NOTE — Progress Notes (Addendum)
Ongoing episodes of anxiety. Patient endorses shortness of breath and incontinence with these episodes. Patient reports four days of insomnia because falling asleep precipitates these episodes. Xanax provided. Emotional support provided. Plan of care discussed with patient.  Ongoing runs of vtach, longest 8 beats. Physician notified.  Longest now 10 at this time.

## 2022-10-26 NOTE — ED Notes (Signed)
Dr. Jomarie Longs contacted about pt requesting something to help him rest. Also, concerned Carvedilol may be making his cough worse.

## 2022-10-26 NOTE — TOC Benefit Eligibility Note (Signed)
Patient Product/process development scientist completed.    The patient is currently admitted and upon discharge could be taking Bidil tablets.  The current 30 day co-pay is $4.00.   The patient is insured through [facility]   Roland Earl, CPHT Pharmacy Patient Advocate Specialist [facility] Patient Advocate Team Direct Number: [phone]  Fax: [phone]

## 2022-10-26 NOTE — ED Notes (Signed)
Pt requesting something to help him rest. Dr. Jomarie Longs contacted about same.

## 2022-10-26 NOTE — ED Notes (Signed)
Patient is currently resting in the chair. States he was able to get some sleep after previously given medication.

## 2022-10-26 NOTE — ED Notes (Signed)
Dr. Jomarie Longs informed pt is having panic episodes when he falls asleep and waking up short of breath at times.

## 2022-10-26 NOTE — ED Notes (Signed)
Walked  into room, patient is still unable to sleep for long periods of time. Patient was placed on 2L [place] for comfort and page was made to the Dr. Patient states whenever he falls asleep for a long period of time he is woken up by shortness of breath.

## 2022-10-26 NOTE — ED Notes (Signed)
Patient began having uncontrollable dry cough. Dr Imogene Burn was contacted and medication was given.

## 2022-10-26 NOTE — ED Notes (Signed)
Echo at bedside

## 2022-10-26 NOTE — Progress Notes (Signed)
  Echocardiogram 2D Echocardiogram has been performed.  Douglas [name] 10/26/2022, 9:04 AM

## 2022-10-26 NOTE — Consult Note (Addendum)
Advanced Heart Failure Team Consult Note   Primary Physician: Claiborne Rigg, NP PCP-Cardiologist: Dr [name]  Reason for Consultation: A/C HFrEF   HPI:    Douglas [name] is seen today for evaluation of A/C HFrEF  at the request of Dr Jomarie Longs.   Douglas [name] is a 46 year old with a history of HTN, hyperlipidemia, smoker, cocaine abuse, NICM, Medtronic ICD, NSVT, and chronic HFrEF. Last used cocaine about a week ago.  EF has been down for some time.   Admitted 02/2022 with A/C HFrEF. Diuresed with IV lasix. Had cath mild nonobstructive CAD. Echo EF 25-30%, LV severely dilated, RV moderately down, moderate to severe Douglas. GDMT resumed and placed on torsemide 100 mg daily.     Prox RCA lesion is 30% stenosed.   Dist RCA lesion is 50% stenosed.   Ost Cx to Prox Cx lesion is 20% stenosed.   Prox LAD to Mid LAD lesion is 40% stenosed.   Dist LAD lesion is 20% stenosed.   The left ventricular ejection fraction is 25-35% by visual estimate CO 5.6 CI 2.1   Echo -EF 25-30%, LV severely dilated, RV moderately down, moderate to severe Douglas.  Admitted 10/18/22 with A/C HFrEF. Diuresed with IV lasix and transitioned to torsemide 100 mg am and 50 mg pm + every week metolazone. Sherryll Burger was held due to elevated creatinine. Discharged on  10/21/22,  Initially felt ok but that night he had hard time sleeping due to a cough. Taking all medications but continued to worsen.  Sunday he slept on the floor because he was having a hard time breathing when he tried to go to bed. He took his medications but missed Sundays morning torsemide. He only took the evening dose of torsemide (50 mg).    He returned to the ED with progressive shortness of breath. Weight had gone up 8 pounds. CXR with pulmonary edema. Creatinine 1.96 on admit.  BNP 1428, HS Trop 120/-->153, K 4.1. Started on IV lasix. Echo ordered.  Ongoing orthopnea.   Medtronic Device Interrogation: Frequent burst of NSVT since December.    SDOH:  Lives with his sister. Works at [employer]. Able to drive to appointments.   Review of Systems: [y] = yes, [ ]  = no   General: Weight gain [Y ]; Weight loss [ ] ; Anorexia [ ] ; Fatigue [ Y]; Fever [ ] ; Chills [ ] ; Weakness [ Y]  Cardiac: Chest pain/pressure [ ] ; Resting SOB [Y ]; Exertional SOB [ Y]; Orthopnea [ Y]; Pedal Edema [ Y]; Palpitations [ ] ; Syncope [ ] ; Presyncope [ ] ; Paroxysmal nocturnal dyspnea[ ]   Pulmonary: Cough [ ] ; Wheezing[ ] ; Hemoptysis[ ] ; Sputum [ ] ; Snoring [ ]   GI: Vomiting[ ] ; Dysphagia[ ] ; Melena[ ] ; Hematochezia [ ] ; Heartburn[ ] ; Abdominal pain [ ] ; Constipation [ ] ; Diarrhea [ ] ; BRBPR [ ]   GU: Hematuria[ ] ; Dysuria [ ] ; Nocturia[ ]   Vascular: Pain in legs with walking [ ] ; Pain in feet with lying flat [ ] ; Non-healing sores [ ] ; Stroke [ ] ; TIA [ ] ; Slurred speech [ ] ;  Neuro: Headaches[ ] ; Vertigo[ ] ; Seizures[ ] ; Paresthesias[ ] ;Blurred vision [ ] ; Diplopia [ ] ; Vision changes [ ]   Ortho/Skin: Arthritis [ ] ; Joint pain [Y ]; Muscle pain [ ] ; Joint swelling [ ] ; Back Pain [Y ]; Rash [ ]   Psych: Depression[Y ]; Anxiety[ ]   Heme: Bleeding problems [ ] ; Clotting disorders [ ] ; Anemia [ ]   Endocrine: Diabetes [Y ];  Thyroid dysfunction[ ]   Home Medications Prior to Admission medications   Medication Sig Start Date End Date Taking? Authorizing Provider  acetaminophen (TYLENOL) 500 MG tablet Take 1 tablet (500 mg total) by mouth every 6 (six) hours as needed. Patient taking differently: Take 1,000 mg by mouth every 6 (six) hours as needed for mild pain, moderate pain, fever or headache. 04/29/21  Yes Claiborne Rigg, NP  atorvastatin (LIPITOR) 40 MG tablet Take 1 tablet (40 mg total) by mouth daily. Patient taking differently: Take 40 mg by mouth in the morning. 03/05/22 03/05/23 Yes Sharol Harness, Brittainy M, PA-C  carvedilol (COREG) 3.125 MG tablet Take 1 tablet (3.125 mg total) by mouth 2 (two) times daily with a meal. 09/22/22  Yes Milford, Anderson Malta, FNP  dapagliflozin  propanediol (FARXIGA) 10 MG TABS tablet Take 1 tablet (10 mg total) by mouth daily before breakfast. Patient taking differently: Take 10 mg by mouth in the morning. 06/02/22 06/02/23 Yes Milford, Anderson Malta, FNP  insulin glargine (LANTUS SOLOSTAR) 100 UNIT/ML Solostar Pen Inject 36 Units into the skin once daily. Patient taking differently: Inject 36 Units into the skin in the morning. 08/09/22  Yes Claiborne Rigg, NP  metolazone (ZAROXOLYN) 2.5 MG tablet Take 1 pill every other Wednesday Patient taking differently: Take 2.5 mg by mouth as directed. 2.5 mg every other Monday as needed for fluid 06/02/22 12/17/22 Yes Milford, [place], FNP  potassium chloride SA (KLOR-CON M) 20 MEQ tablet Take 1 tablet (20 mEq total) by mouth 2 (two) times daily. 06/21/22  Yes Laurey Morale, MD  torsemide (DEMADEX) 100 MG tablet Take 1 tablet (100 mg total) by mouth in the morning AND 0.5 tablets (50 mg total) every evening. Patient taking differently: 100 mg every morning, 50 mg every evening 04/28/22  Yes Milford, Anderson Malta, FNP  aspirin 81 MG EC tablet Take 1 tablet (81 mg total) by mouth daily. Patient not taking: Reported on 10/25/2022 03/05/22   Robbie Lis M, PA-C  Insulin Pen Needle (BD PEN NEEDLE NANO U/F) 32G X 4 MM MISC INJECT 1 APPLICATION INTO THE SKIN DAILY. 08/24/22   Hoy Register, MD    Past Medical History: Past Medical History:  Diagnosis Date   Depression, major, single episode 01/14/2017   Diabetes mellitus [facility])    ED (erectile dysfunction)    History of syncope 01/29/2015   Hypertension    ICD (implantable cardioverter-defibrillator) discharge 11/30/2014   On 11/30/14. Asymptomatic.    Nonischemic cardiomyopathy (HCC)    a.  echo 4/06: EF 30%, mild to mod Douglas, mild RAE, inf HK, lat HK , ant HK;    b.  cath 4/06: no CAD, EF 20-25%   NSVT (nonsustained ventricular tachycardia) (HCC)    Obesity    Systolic CHF, chronic (HCC)    EF 11/17 25-30%, s/p ICD    Past Surgical History: Past  Surgical History:  Procedure Laterality Date   CARDIAC CATHETERIZATION  09/2011; 02/2013; 04/2013   CARDIAC DEFIBRILLATOR PLACEMENT  08/23/2013   IMPLANTABLE CARDIOVERTER DEFIBRILLATOR IMPLANT N/A 08/23/2013   Procedure: IMPLANTABLE CARDIOVERTER DEFIBRILLATOR IMPLANT;  Surgeon: Duke Salvia, MD;  Location: [facility] CATH LAB;  Service: Cardiovascular;  Laterality: N/A;   LEFT AND RIGHT HEART CATHETERIZATION WITH CORONARY ANGIOGRAM N/A 09/20/2011   Procedure: LEFT AND RIGHT HEART CATHETERIZATION WITH CORONARY ANGIOGRAM;  Surgeon: Dolores Patty, MD;  Location: [facility] CATH LAB;  Service: Cardiovascular;  Laterality: N/A;   MULTIPLE EXTRACTIONS WITH ALVEOLOPLASTY N/A 01/26/2013   Procedure:  EXTRACION  TOOTH # 19 WITH ALVEOLOPLASTY;  Surgeon: Charlynne Pander, DDS;  Location: [facility] OR;  Service: Oral Surgery;  Laterality: N/A;   RIGHT HEART CATHETERIZATION N/A 02/22/2013   Procedure: RIGHT HEART CATH;  Surgeon: Dolores Patty, MD;  Location: [facility] CATH LAB;  Service: Cardiovascular;  Laterality: N/A;   RIGHT HEART CATHETERIZATION N/A 05/03/2013   Procedure: RIGHT HEART CATH;  Surgeon: Dolores Patty, MD;  Location: [facility] CATH LAB;  Service: Cardiovascular;  Laterality: N/A;   RIGHT/LEFT HEART CATH AND CORONARY ANGIOGRAPHY N/A 03/03/2022   Procedure: RIGHT/LEFT HEART CATH AND CORONARY ANGIOGRAPHY;  Surgeon: Dolores Patty, MD;  Location: [facility] INVASIVE CV LAB;  Service: Cardiovascular;  Laterality: N/A;    Family History: Family History  Problem Relation Age of Onset   Coronary artery disease Mother 55       s/p PCI   Lung cancer Father    Diabetes type II Maternal Uncle    Coronary artery disease Maternal Uncle    Stroke Neg Hx    Heart attack Neg Hx     Social History: Social History   Socioeconomic History   Marital status: Widowed    Spouse name: Not on file   Number of children: 5   Years of education: 36   Highest education level: Not on file  Occupational History   Occupation: [employer]    Comment: part-time  Tobacco Use   Smoking status: Every Day    Packs/day: 0.25    Years: 8.00    Total pack years: 2.00    Types: Cigarettes   Smokeless tobacco: Never   Tobacco comments:    ~3 cigarettes a day  Vaping Use   Vaping Use: Never used  Substance and Sexual Activity   Alcohol use: Yes    Alcohol/week: 0.0 standard drinks of alcohol    Comment: 2x week.    Drug use: Not Currently    Types: Cocaine    Comment: 08/10/17 - last use one week ago   Sexual activity: Yes  Other Topics Concern   Not on file  Social History Narrative   Referred to PCP- appointment made for 09/04/18 at 9:30am at [facility]      Provided with food pantry and free meal list- patient reported sometimes having issues paying for food but reports he does received food stamps and works part time- he does not pay for housing as he lives with his sister.      Patient uses his mother's car and reports no issues getting transport to to medical appointments.   Social Determinants of Health   Financial Resource Strain: High Risk (03/01/2022)   Overall Financial Resource Strain (CARDIA)    Difficulty of Paying Living Expenses: Hard  Food Insecurity: No Food Insecurity (03/01/2022)   Hunger Vital Sign    Worried About Running Out of Food in the Last Year: Never true    Ran Out of Food in the Last Year: Never true  Transportation Needs: No Transportation Needs (03/01/2022)   PRAPARE - Administrator, Civil Service (Medical): No    Lack of Transportation (Non-Medical): No  Physical Activity: Not on file  Stress: Not on file  Social Connections: Not on file    Allergies:  Allergies  Allergen Reactions   Bydureon [Exenatide] Rash   Penicillins Other (See Comments)    Childhood allergy Unknown reaction    Objective:    Vital Signs:   Temp:  [97.7 F (36.5 C)-98.5  F (36.9 C)] 97.9 F (36.6 C) (12/12 0831) Pulse Rate:  [66-94] 91 (12/12 1000) Resp:  [15-29] 21 (12/12  1000) BP: (115-142)/(79-117) 121/84 (12/12 1000) SpO2:  [93 %-100 %] 98 % (12/12 1000)    Weight change: Filed Weights   10/25/22 0815  Weight: 127 kg    Intake/Output:   Intake/Output Summary (Last 24 hours) at 10/26/2022 1100 Last data filed at 10/26/2022 0900 Gross per 24 hour  Intake 3 ml  Output 1650 ml  Net -1647 ml      Physical Exam    General:  Sitting in the recliner.  HEENT: normal Neck: supple. JVP 10-11. Carotids 2+ bilat; no bruits. No lymphadenopathy or thyromegaly appreciated. Cor: PMI nondisplaced. Regular rate & rhythm. No rubs, gallops or murmurs. Lungs: R and LLL crackles in the bases Abdomen: soft, nontender, nondistended. No hepatosplenomegaly. No bruits or masses. Good bowel sounds. Extremities: no cyanosis, clubbing, rash, R and LLE trace -1+ edema Neuro: alert & orientedx3, cranial nerves grossly intact. moves all 4 extremities w/o difficulty. Affect pleasant   Telemetry   SR 80s with frequent PVCs/ NSVT   EKG    SR 81 bpm   Labs   Basic Metabolic Panel: Recent Labs  Lab 10/20/22 0025 10/21/22 0107 10/25/22 1442 10/26/22 0420  NA 137 138 137 137  K 3.4* 3.7 4.1 3.5  CL 97* 97* 103 105  CO2 28 29 21* 21*  GLUCOSE 113* 104* 151* 149*  BUN 25* 30* 29* 26*  CREATININE 1.80* 2.03* 1.96* 1.75*  CALCIUM 9.4 9.0 8.7* 8.7*    Liver Function Tests: Recent Labs  Lab 10/25/22 1442 10/26/22 0420  AST 29 21  ALT 41 36  ALKPHOS 69 65  BILITOT 0.9 1.1  PROT 7.2 6.8  ALBUMIN 3.1* 3.0*   No results for input(s): "LIPASE", "AMYLASE" in the last 168 hours. No results for input(s): "AMMONIA" in the last 168 hours.  CBC: Recent Labs  Lab 10/20/22 0025 10/21/22 0107 10/25/22 1442 10/26/22 0420  WBC 8.4 8.8 6.9 8.1  NEUTROABS  --   --  3.7  --   HGB 17.0 17.1* 16.2 15.7  HCT 49.0 48.5 46.0 47.0  MCV 84.3 83.2 83.6 86.4  PLT 199 204 274 271    Cardiac Enzymes: No results for input(s): "CKTOTAL", "CKMB", "CKMBINDEX",  "TROPONINI" in the last 168 hours.  BNP: BNP (last 3 results) Recent Labs    07/22/22 1452 10/18/22 1821 10/25/22 1442  BNP 557.4* 1,494.7* 1,428.2*    ProBNP (last 3 results) No results for input(s): "PROBNP" in the last 8760 hours.   CBG: Recent Labs  Lab 10/20/22 2106 10/21/22 0601 10/21/22 1156 10/25/22 2144 10/26/22 0751  GLUCAP 136* 146* 126* 191* 161*    Coagulation Studies: No results for input(s): "LABPROT", "INR" in the last 72 hours.   Imaging   ECHOCARDIOGRAM COMPLETE  Result Date: 10/26/2022    ECHOCARDIOGRAM REPORT   Patient Name:   Douglas [name] Date of Exam: 10/26/2022 Medical Rec #:  [id number]         Height:       76.0 in Accession #:    [id number]        Weight:       280.0 lb Date of Birth:  06-02-1976        BSA:          2.556 m Patient Age:    46 years          BP:  122/101 mmHg Patient Gender: M                 HR:           112 bpm. Exam Location:  Inpatient Procedure: 2D Echo, Cardiac Doppler, Color Doppler and 3D Echo Indications:    CHF-Acute Systolic I50.21  History:        Patient has prior history of Echocardiogram examinations, most                 recent 03/02/2022. Cardiomyopathy, Defibrillator; Risk                 Factors:Hypertension and Diabetes.  Sonographer:    Leta Jungling RDCS Referring Phys: [id number] Cecille Po MELVIN  Sonographer Comments: Image acquisition challenging due to respiratory motion and coughing. IMPRESSIONS  1. Left ventricular ejection fraction, by estimation, is 20 to 25%. The left ventricle has severely decreased function. The left ventricle demonstrates global hypokinesis. The left ventricular internal cavity size was severely dilated. Left ventricular diastolic parameters are indeterminate.  2. Right ventricular systolic function is low normal. The right ventricular size is severely enlarged. There is severely elevated pulmonary artery systolic pressure.  3. Left atrial size was severely dilated.  4.  Right atrial size was severely dilated.  5. The mitral valve is abnormal. Moderate mitral valve regurgitation. No evidence of mitral stenosis.  6. Tricuspid valve regurgitation is moderate.  7. The aortic valve is normal in structure. Aortic valve regurgitation is not visualized. No aortic stenosis is present. Comparison(s): RVSP appears worse, LVEF and stroke volume appear slightly worse. FINDINGS  Left Ventricle: Left ventricular ejection fraction, by estimation, is 20 to 25%. The left ventricle has severely decreased function. The left ventricle demonstrates global hypokinesis. The left ventricular internal cavity size was severely dilated. There is no left ventricular hypertrophy. Left ventricular diastolic parameters are indeterminate. Right Ventricle: The right ventricular size is severely enlarged. No increase in right ventricular wall thickness. Right ventricular systolic function is low normal. There is severely elevated pulmonary artery systolic pressure. The tricuspid regurgitant  velocity is 3.69 m/s, and with an assumed right atrial pressure of 15 mmHg, the estimated right ventricular systolic pressure is 69.5 mmHg. Left Atrium: Left atrial size was severely dilated. Right Atrium: Right atrial size was severely dilated. Pericardium: There is no evidence of pericardial effusion. Mitral Valve: Normal LVCA on 2D planimetry. Blunted right sided pulmonary veins. The mitral valve is abnormal. Moderate mitral valve regurgitation. No evidence of mitral valve stenosis. MV peak gradient, 10.5 mmHg. The mean mitral valve gradient is 5.0 mmHg. Tricuspid Valve: The tricuspid valve is normal in structure. Tricuspid valve regurgitation is moderate. Aortic Valve: The aortic valve is normal in structure. Aortic valve regurgitation is not visualized. No aortic stenosis is present. Pulmonic Valve: The pulmonic valve was normal in structure. Pulmonic valve regurgitation is mild. No evidence of pulmonic stenosis. Aorta: The  aortic root and ascending aorta are structurally normal, with no evidence of dilitation. IAS/Shunts: No atrial level shunt detected by color flow Doppler. Additional Comments: A device lead is visualized in the right ventricle and right atrium.  LEFT VENTRICLE PLAX 2D LVIDd:         7.30 cm LVIDs:         6.60 cm LV PW:         0.90 cm LV IVS:        0.90 cm LVOT diam:     2.40 cm LV SV:  45 LV SV Index:   18 LVOT Area:     4.52 cm  RIGHT VENTRICLE RV Basal diam:  5.90 cm RV Mid diam:    4.20 cm RV S prime:     704.00 cm/s TAPSE (M-mode): 1.7 cm LEFT ATRIUM              Index        RIGHT ATRIUM           Index LA diam:        6.70 cm  2.62 cm/m   RA Area:     26.10 cm LA Vol (A2C):   117.0 ml 45.78 ml/m  RA Volume:   94.40 ml  36.94 ml/m LA Vol (A4C):   146.0 ml 57.13 ml/m LA Biplane Vol: 134.0 ml 52.43 ml/m  AORTIC VALVE LVOT Vmax:   69.15 cm/s LVOT Vmean:  49.900 cm/s LVOT VTI:    0.099 m  AORTA Ao Root diam: 2.90 cm Ao Asc diam:  3.00 cm MITRAL VALVE                  TRICUSPID VALVE MV Area (PHT): 5.88 cm       TR Peak grad:   54.5 mmHg MV Area VTI:   1.61 cm       TR Vmax:        369.00 cm/s MV Peak grad:  10.5 mmHg MV Mean grad:  5.0 mmHg       SHUNTS MV Vmax:       1.62 m/s       Systemic VTI:  0.10 m MV Vmean:      101.0 cm/s     Systemic Diam: 2.40 cm MV Decel Time: 129 msec Douglas Peak grad:    96.0 mmHg Douglas Mean grad:    53.0 mmHg Douglas Vmax:         490.00 cm/s Douglas Vmean:        331.0 cm/s Douglas PISA:         3.08 cm Douglas PISA Eff ROA: 24 mm Douglas PISA Radius:  0.70 cm MV E velocity: 129.00 cm/s Riley Lam MD Electronically signed by Riley Lam MD Signature Date/Time: 10/26/2022/9:14:04 AM    Final      Medications:     Current Medications:  atorvastatin  40 mg Oral Daily   carvedilol  3.125 mg Oral BID WC   dapagliflozin propanediol  10 mg Oral QAC breakfast   enoxaparin (LOVENOX) injection  60 mg Subcutaneous Q24H   furosemide  80 mg Intravenous BID   insulin aspart   0-15 Units Subcutaneous TID WC   insulin glargine-yfgn  15 Units Subcutaneous Daily   potassium chloride  40 mEq Oral BID   sodium chloride flush  3 mL Intravenous Q12H    Infusions:     Patient Profile    Douglas Windland is a 46 year old with a history of HTN, hyperlipidemia, smoker, cocaine abuse,NSVT,  NICM, Medtronic ICD, and chronic HFrEF. Last used cocaine about a week ago.  EF has been down for some time.   Admitted with A/C HFrEF. ? Suspect he had residual congestion when discharged.    Assessment/Plan   1. A/C HFrEF  NICM. Has Medtronic ICD. 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  Discharged last week and I suspect he had residual congestion and likely increased fluid intake.  - BNP essentially the same as last week. Interrogate ICD. Check Reds Clip. Will need RHC prior  to discharge ensure decongestion. Admitted with NYHA IV symptoms.  - On exam he appears warm/wet. Bicarb 21. Will continue IV lasix 80 mg twice a day and give dose of metolazone.  - Continue low dose coreg.  - Hold entresto/spiro with creatinine 2.  - Continue farxiga -May need to consider Bidil for afterload reduction.   - Follow renal function  - Will start process for Furoscix after discharge.   2. CKD Stage IIIb -Creatinine on admit 2. Baseline previously ~ 1.6.  - Trending down to 1.  - Follow creatinine.   3. Substance Abuse Last used cocaine about a week ago.  Discussed cessation.   4. CAD LHC 02/2022 nonobstructive CAD - LDL 124  - Increase atorvastatin to 80 mg daily   5. DMII -On SSI  6. HTN  -Continue current meds.  -May need to consider Bidil.   7. NSVT -Previously on amio but he stopped in April of this year.  I am not sure  why he stopped.  -Frequent burst of NSVT noted on device since the beginning of December.  - Continue current dose of coreg.  - Considered po amio. But with frequent ectopy will start IV amio .   8. Obesity  Body mass index is 34.08  kg/m.  9. Douglas  Once diuresed will need TEE to further assess severity.   Place PICC line to follow CVP-CO-OX        Length of Stay: 1  Makel Mcmann, NP  10/26/2022, 11:00 AM  Advanced Heart Failure Team Pager [phone] (M-F; 7a - 5p)  Please contact [facility] for night-coverage after hours (4p -7a ) and weekends on [email or web address]

## 2022-10-26 NOTE — ED Notes (Signed)
Patient is now back in bed.

## 2022-10-26 NOTE — ED Notes (Signed)
Patient transitioned back to the chair for comfort.

## 2022-10-26 NOTE — Progress Notes (Addendum)
PROGRESS NOTE    CONSTANCE ANCHETA  [id number] DOB: [birth year] DOA: 10/25/2022 PCP: Gildardo Pounds, NP   46/M with chronic systolic CHF, NICM,ICD, essential hypertension, hyperlipidemia, obesity, cocaine use,  nonobstructive coronary artery disease presented to the emergency room with shortness of breath for 1-2 days, DC from [facility] on 12/7 off entresto, Dc weight was 272;bs. In ED, creatinine of 1.96,  Troponin -120  BNP -1428.  Chest x-ray showed prominent pulmonary vasculature and questionable small left pleural effusion , weight up to 280lbs  Subjective: Some cough, not able to lay flat  Assessment and Plan:  Acute on chronic systolic heart failure: NICM -Presented with DoE, orthopnea and weight gain. -last echo 4/23 ECHO w ejection fraction 25-30%, mod reduced RV.  -Compliant with meds for the most part, did not take diuretics on Sunday, eats out frequently, weight up 8lbs since recent DC -continue IV lasix farxiga, coreg -resume entresto if creatinine continues to improve   AKI on CKD 3a -Baseline creatinine around 1.5-1.6, creatinine 1.9 on admission -Diuretics as above, Entresto held last admit  H/o VT -has ICD, continue Coreg   Type 2 diabetes on insulin:  -Continue glargine   Troponin elevation: Mild troponin elevation secondary to demand ischemia, CHF -Cardiac cath in April with mild coronary artery disease.   -Patient is on aspirin, Coreg and Lipitor.    Cocaine use: Counseled.  -denies any use since recent DC   DVT prophylaxis:lovenox Code Status: Full COde Family Communication: None present Disposition Plan: Home likely 48h  Consultants:    Procedures:   Antimicrobials:    Objective: Vitals:   10/26/22 0130 10/26/22 0200 10/26/22 0251 10/26/22 0415  BP: (!) 136/95 (!) 119/94  (!) 137/96  Pulse: 86 68  84  Resp: (!) 23 (!) 25  (!) 29  Temp:   98.4 F (36.9 C)   TempSrc:   Oral   SpO2: 93% 98%  98%  Weight:      Height:         Intake/Output Summary (Last 24 hours) at 10/26/2022 0531 Last data filed at 10/26/2022 0125 Gross per 24 hour  Intake 3 ml  Output 1250 ml  Net -1247 ml   Filed Weights   10/25/22 0815  Weight: 127 kg    Examination:  Gen: Pleasant obese male sitting up in bed awake, Alert, Oriented X 3,  HEENT: + JVD Lungs: Decreased breath sounds at the bases CVS: S1S2/RRR Abd: soft, Non tender, non distended, BS present Extremities: No edema Skin: no new rashes on exposed skin    Data Reviewed:   CBC: Recent Labs  Lab 10/20/22 0025 10/21/22 0107 10/25/22 1442 10/26/22 0420  WBC 8.4 8.8 6.9 8.1  NEUTROABS  --   --  3.7  --   HGB 17.0 17.1* 16.2 15.7  HCT 49.0 48.5 46.0 47.0  MCV 84.3 83.2 83.6 86.4  PLT 199 204 274 [id number]   Basic Metabolic Panel: Recent Labs  Lab 10/20/22 0025 10/21/22 0107 10/25/22 1442 10/26/22 0420  NA 137 138 137 137  K 3.4* 3.7 4.1 3.5  CL 97* 97* 103 105  CO2 28 29 21* 21*  GLUCOSE 113* 104* 151* 149*  BUN 25* 30* 29* 26*  CREATININE 1.80* 2.03* 1.96* 1.75*  CALCIUM 9.4 9.0 8.7* 8.7*   GFR: Estimated Creatinine Clearance: 75.1 mL/min (A) (by C-G formula based on SCr of 1.75 mg/dL (H)). Liver Function Tests: Recent Labs  Lab 10/25/22 1442 10/26/22 0420  AST 29 21  ALT 41 36  ALKPHOS 69 65  BILITOT 0.9 1.1  PROT 7.2 6.8  ALBUMIN 3.1* 3.0*   No results for input(s): "LIPASE", "AMYLASE" in the last 168 hours. No results for input(s): "AMMONIA" in the last 168 hours. Coagulation Profile: No results for input(s): "INR", "PROTIME" in the last 168 hours. Cardiac Enzymes: No results for input(s): "CKTOTAL", "CKMB", "CKMBINDEX", "TROPONINI" in the last 168 hours. BNP (last 3 results) No results for input(s): "PROBNP" in the last 8760 hours. HbA1C: No results for input(s): "HGBA1C" in the last 72 hours. CBG: Recent Labs  Lab 10/20/22 1643 10/20/22 2106 10/21/22 0601 10/21/22 1156 10/25/22 2144  GLUCAP 135* 136* 146* 126* 191*    Lipid Profile: No results for input(s): "CHOL", "HDL", "LDLCALC", "TRIG", "CHOLHDL", "LDLDIRECT" in the last 72 hours. Thyroid Function Tests: No results for input(s): "TSH", "T4TOTAL", "FREET4", "T3FREE", "THYROIDAB" in the last 72 hours. Anemia Panel: No results for input(s): "VITAMINB12", "FOLATE", "FERRITIN", "TIBC", "IRON", "RETICCTPCT" in the last 72 hours. Urine analysis:    Component Value Date/Time   COLORURINE STRAW (A) 06/11/2020 1423   APPEARANCEUR CLEAR 06/11/2020 1423   LABSPEC <=1.005 06/26/2021 1229   PHURINE 6.0 06/26/2021 1229   GLUCOSEU 250 (A) 06/26/2021 1229   HGBUR TRACE (A) 06/26/2021 1229   BILIRUBINUR NEGATIVE 06/26/2021 1229   KETONESUR NEGATIVE 06/26/2021 1229   PROTEINUR NEGATIVE 06/26/2021 1229   UROBILINOGEN 0.2 06/26/2021 1229   NITRITE NEGATIVE 06/26/2021 1229   LEUKOCYTESUR NEGATIVE 06/26/2021 1229   Sepsis Labs: @LABRCNTIP (procalcitonin:4,lacticidven:4)  )No results found for this or any previous visit (from the past 240 hour(s)).   Radiology Studies: DG Chest 2 View  Result Date: 10/25/2022 CLINICAL DATA:  Shortness of breath EXAM: CHEST - 2 VIEW COMPARISON:  Previous studies including the examination of 10/18/2022 FINDINGS: Transverse diameter of heart is increased. Central pulmonary vessels are more prominent. Subtle increased markings are seen in parahilar regions. There is no focal consolidation. There is minimal blunting of left lateral CP angle. There is no pneumothorax. Pacemaker/defibrillator battery is seen in left infraclavicular region. IMPRESSION: Cardiomegaly. Central pulmonary vessels are more prominent suggesting CHF. Possible small left pleural effusion. Electronically Signed   By: 14/02/2022 M.D.   On: 10/25/2022 09:05     Scheduled Meds:  atorvastatin  40 mg Oral Daily   carvedilol  3.125 mg Oral BID WC   dapagliflozin propanediol  10 mg Oral QAC breakfast   enoxaparin (LOVENOX) injection  60 mg Subcutaneous Q24H    HYDROcodone bit-homatropine  5 mL Oral Once   insulin aspart  0-15 Units Subcutaneous TID WC   insulin glargine-yfgn  15 Units Subcutaneous Daily   sodium chloride flush  3 mL Intravenous Q12H   Continuous Infusions:  furosemide       LOS: 1 day    Time spent: 14/09/2022    , MD [facility]   10/26/2022, 5:31 AM

## 2022-10-27 DIAGNOSIS — I5023 Acute on chronic systolic (congestive) heart failure: Secondary | ICD-10-CM | POA: Diagnosis not present

## 2022-10-27 LAB — COOXEMETRY PANEL
Carboxyhemoglobin: 1.5 % (ref 0.5–1.5)
Carboxyhemoglobin: 1.8 % — ABNORMAL HIGH (ref 0.5–1.5)
Methemoglobin: <0.7 % (ref 0.0–1.5)
Methemoglobin: <0.7 % (ref 0.0–1.5)
O2 Saturation: 53.4 %
O2 Saturation: 59.8 %
Total hemoglobin: 15.9 g/dL (ref 12.0–16.0)
Total hemoglobin: 15.6 g/dL (ref 12.0–16.0)

## 2022-10-27 LAB — MAGNESIUM: Magnesium: 2.5 mg/dL — ABNORMAL HIGH (ref 1.7–2.4)

## 2022-10-27 LAB — BASIC METABOLIC PANEL
Anion gap: 8 (ref 5–15)
BUN: 32 mg/dL — ABNORMAL HIGH (ref 6–20)
CO2: 27 mmol/L (ref 22–32)
Calcium: 9.2 mg/dL (ref 8.9–10.3)
Chloride: 103 mmol/L (ref 98–111)
Creatinine, Ser: 1.87 mg/dL — ABNORMAL HIGH (ref 0.61–1.24)
GFR, Estimated: 44 mL/min — ABNORMAL LOW (ref >60)
Glucose, Bld: 132 mg/dL — ABNORMAL HIGH (ref 70–99)
Potassium: 4.8 mmol/L (ref 3.5–5.1)
Sodium: 138 mmol/L (ref 135–145)

## 2022-10-27 LAB — GLUCOSE, CAPILLARY
Glucose-Capillary: 133 mg/dL — ABNORMAL HIGH (ref 70–99)
Glucose-Capillary: 225 mg/dL — ABNORMAL HIGH (ref 70–99)
Glucose-Capillary: 139 mg/dL — ABNORMAL HIGH (ref 70–99)
Glucose-Capillary: 142 mg/dL — ABNORMAL HIGH (ref 70–99)

## 2022-10-27 LAB — LACTIC ACID, PLASMA: Lactic Acid, Venous: 1.2 mmol/L (ref 0.5–1.9)

## 2022-10-27 MED ORDER — AMIODARONE HCL IN DEXTROSE 360-4.14 MG/200ML-% IV SOLN
60.0000 mg/h | INTRAVENOUS | Status: AC
  Administered 2022-10-27 (×2): 60 mg/h via INTRAVENOUS
  Filled 2022-10-27 (×2): qty 200

## 2022-10-27 MED ORDER — AMIODARONE HCL IN DEXTROSE 360-4.14 MG/200ML-% IV SOLN
30.0000 mg/h | INTRAVENOUS | Status: DC
  Administered 2022-10-27 – 2022-11-11 (×30): 30 mg/h via INTRAVENOUS
  Administered 2022-11-11 – 2022-11-13 (×7): 60 mg/h via INTRAVENOUS
  Administered 2022-11-13 (×2): 30 mg/h via INTRAVENOUS
  Administered 2022-11-13: 60 mg/h via INTRAVENOUS
  Administered 2022-11-14: 30 mg/h via INTRAVENOUS
  Administered 2022-11-14 – 2022-11-16 (×9): 60 mg/h via INTRAVENOUS
  Administered 2022-11-17: 30 mg/h via INTRAVENOUS
  Administered 2022-11-17: 60 mg/h via INTRAVENOUS
  Administered 2022-11-18: 30 mg/h via INTRAVENOUS
  Filled 2022-10-27 (×36): qty 200
  Filled 2022-10-27: qty 400
  Filled 2022-10-27 (×6): qty 200
  Filled 2022-10-27: qty 400
  Filled 2022-10-27 (×7): qty 200

## 2022-10-27 MED ORDER — SODIUM CHLORIDE 0.9% FLUSH
10.0000 mL | Freq: Two times a day (BID) | INTRAVENOUS | Status: DC
  Administered 2022-10-27 – 2022-11-04 (×12): 10 mL
  Administered 2022-11-06: 40 mL
  Administered 2022-11-07 – 2022-11-11 (×7): 10 mL
  Administered 2022-11-11 – 2022-11-14 (×2): 20 mL
  Administered 2022-11-15 (×2): 10 mL

## 2022-10-27 MED ORDER — HYDRALAZINE HCL 25 MG PO TABS
25.0000 mg | ORAL_TABLET | Freq: Three times a day (TID) | ORAL | Status: DC
  Administered 2022-10-27 – 2022-11-09 (×39): 25 mg via ORAL
  Filled 2022-10-27 (×39): qty 1

## 2022-10-27 MED ORDER — AMIODARONE LOAD VIA INFUSION
150.0000 mg | Freq: Once | INTRAVENOUS | Status: AC
  Administered 2022-10-27: 150 mg via INTRAVENOUS
  Filled 2022-10-27: qty 83.34

## 2022-10-27 MED ORDER — MILRINONE LACTATE IN DEXTROSE 20-5 MG/100ML-% IV SOLN
0.2500 ug/kg/min | INTRAVENOUS | Status: DC
  Administered 2022-10-27 – 2022-10-29 (×5): 0.25 ug/kg/min via INTRAVENOUS
  Administered 2022-10-30 – 2022-11-02 (×4): 0.125 ug/kg/min via INTRAVENOUS
  Administered 2022-11-04 – 2022-11-10 (×15): 0.25 ug/kg/min via INTRAVENOUS
  Administered 2022-11-10 – 2022-11-18 (×25): 0.375 ug/kg/min via INTRAVENOUS
  Filled 2022-10-27 (×24): qty 100
  Filled 2022-10-27: qty 200
  Filled 2022-10-27 (×6): qty 100
  Filled 2022-10-27: qty 200
  Filled 2022-10-27 (×12): qty 100
  Filled 2022-10-27: qty 200
  Filled 2022-10-27 (×7): qty 100

## 2022-10-27 MED ORDER — CHLORHEXIDINE GLUCONATE CLOTH 2 % EX PADS
6.0000 | MEDICATED_PAD | Freq: Every day | CUTANEOUS | Status: DC
  Administered 2022-10-27 – 2022-11-18 (×23): 6 via TOPICAL

## 2022-10-27 MED ORDER — FUROSEMIDE 10 MG/ML IJ SOLN
80.0000 mg | Freq: Once | INTRAMUSCULAR | Status: AC
  Administered 2022-10-27: 80 mg via INTRAVENOUS
  Filled 2022-10-27: qty 8

## 2022-10-27 MED ORDER — SODIUM CHLORIDE 0.9% FLUSH: 10.0000 mL | INTRAVENOUS | Status: DC | PRN

## 2022-10-27 MED ORDER — SODIUM CHLORIDE 0.9 % IV SOLN: INTRAVENOUS | Status: DC

## 2022-10-27 MED ORDER — ISOSORBIDE MONONITRATE ER 30 MG PO TB24
30.0000 mg | ORAL_TABLET | Freq: Every day | ORAL | Status: DC
  Administered 2022-10-27: 30 mg via ORAL
  Filled 2022-10-27: qty 1

## 2022-10-27 NOTE — Consult Note (Signed)
   [facility] Inpatient Consult   10/27/2022  AMEIR FARIA Oct 03, 1976 [id number]  Managed Medicaid [MM]:  [employer], High risk MM  Primary Care Provider:  Claiborne Rigg, NP, [facility]   Patient is currently active with [facility] [[facility]] Care Management for chronic disease management services.  Patient is to be engaged by a Managed Medicaid.  Our community based plan of care has focused on disease management and community resource support.    Plan:  Patient for follow up with MM team and agreed to plan for post hospital follow up still with housing needs.  Made Inpatient Transition Of Care [TOC] team member to make aware that [facility] Care Management following.   Of note, [facility] Care Management services does not replace or interfere with any services that are needed or arranged by inpatient [facility] care management team.   For additional questions or referrals please contact:  Charlesetta Shanks, RN BSN CCM [facility]  [phone] business mobile phone Toll free office [phone]  *Concierge Line  [phone] Fax number: [phone] [email or web address]

## 2022-10-27 NOTE — Progress Notes (Signed)
   Order placed for TEE to assess mitral regurgitation 10/28/22 at 1330.    Rosanne Wohlfarth NP-C  2:45 PM

## 2022-10-27 NOTE — Progress Notes (Signed)
   Called by nursing for increased work of breathing.   Amio drip started for NSVT. And frequent PVCs.   PICC placed. CVP 12-13.  On room air. 93%. WIll add supplemental oxygen.   Urine output sluggish.   CO-OX 53%. Add milrinone 0.25 mcg.  Give additional 80 mg IV lasix now.   Follow closely. May need to add Bipap.   Repeat CO-OX in 2 hours.    Niaomi Cartaya NP-C  11:07 AM

## 2022-10-27 NOTE — Progress Notes (Signed)
Peripherally Inserted Central Catheter Placement  The IV Nurse has discussed with the patient and/or persons authorized to consent for the patient, the purpose of this procedure and the potential benefits and risks involved with this procedure.  The benefits include less needle sticks, lab draws from the catheter, and the patient may be discharged home with the catheter. Risks include, but not limited to, infection, bleeding, blood clot (thrombus formation), and puncture of an artery; nerve damage and irregular heartbeat and possibility to perform a PICC exchange if needed/ordered by physician.  Alternatives to this procedure were also discussed.  Bard Power PICC patient education guide, fact sheet on infection prevention and patient information card has been provided to patient /or left at bedside.    PICC Placement Documentation  PICC Double Lumen 10/27/22 Right Basilic 44 cm 0 cm (Active)  Indication for Insertion or Continuance of Line Vasoactive infusions 10/27/22 0912  Exposed Catheter (cm) 0 cm 10/27/22 0912  Site Assessment Clean, Dry, Intact 10/27/22 0912  Lumen #1 Status Flushed;Saline locked;Blood return noted 10/27/22 0912  Lumen #2 Status Flushed;Saline locked;Blood return noted 10/27/22 0912  Dressing Type Transparent;Securing device 10/27/22 0912  Dressing Status Antimicrobial disc in place;Clean, Dry, Intact 10/27/22 0912  Safety Lock Not Applicable 10/27/22 0912  Line Adjustment (NICU/IV Team Only) No 10/27/22 0912  Dressing Intervention New dressing;Other (Comment) 10/27/22 0912  Dressing Change Due 11/03/22 10/27/22 0912       Annett Fabian 10/27/2022, 9:13 AM

## 2022-10-27 NOTE — Anesthesia Preprocedure Evaluation (Signed)
Anesthesia Evaluation  Patient identified by MRN, date of birth, ID band Patient awake    Reviewed: Allergy & Precautions, Patient's Chart, lab work & pertinent test results  Airway Mallampati: II       Dental   Pulmonary Current Smoker and Patient abstained from smoking.   breath sounds clear to auscultation       Cardiovascular hypertension, + CAD, +CHF and + DVT  + Cardiac Defibrillator (for NSVT)  Rhythm:Regular Rate:Normal  Non ischemic Cardiomyopathy  10/26/2022 TTE 1. Left ventricular ejection fraction, by estimation, is 20 to 25%. The  left ventricle has severely decreased function. The left ventricle  demonstrates global hypokinesis. The left ventricular internal cavity size  was severely dilated. Left ventricular  diastolic parameters are indeterminate.   2. Right ventricular systolic function is low normal. The right  ventricular size is severely enlarged. There is severely elevated  pulmonary artery systolic pressure.   3. Left atrial size was severely dilated.   4. Right atrial size was severely dilated.   5. The mitral valve is abnormal. Moderate mitral valve regurgitation. No  evidence of mitral stenosis.   6. Tricuspid valve regurgitation is moderate.   7. The aortic valve is normal in structure. Aortic valve regurgitation is  not visualized. No aortic stenosis is present.      Neuro/Psych Seizures -,  PSYCHIATRIC DISORDERS  Depression       GI/Hepatic   Endo/Other  diabetes    Renal/GU Renal InsufficiencyRenal diseaseLab Results      Component                Value               Date                      CREATININE               1.87 (H)            10/27/2022                BUN                      32 (H)              10/27/2022                NA                       138                 10/27/2022                K                        4.8                 10/27/2022                CL                        103                 10/27/2022                CO2  27                  10/27/2022                Musculoskeletal   Abdominal  (+) + obese (BMI 33.1)  Peds  Hematology Lab Results      Component                Value               Date                      WBC                      8.1                 10/26/2022                HGB                      15.7                10/26/2022                HCT                      47.0                10/26/2022                MCV                      86.4                10/26/2022                PLT                      271                 10/26/2022           On Lovenox   Anesthesia Other Findings All: PCn  Reproductive/Obstetrics                             Anesthesia Physical Anesthesia Plan  ASA: 4  Anesthesia Plan: MAC   Post-op Pain Management:    Induction: Intravenous  PONV Risk Score and Plan: Treatment may vary due to age or medical condition and Propofol infusion  Airway Management Planned: Natural Airway and Nasal Cannula  Additional Equipment: None  Intra-op Plan:   Post-operative Plan:   Informed Consent: I have reviewed the patients History and Physical, chart, labs and discussed the procedure including the risks, benefits and alternatives for the proposed anesthesia with the patient or authorized representative who has indicated his/her understanding and acceptance.     Dental advisory given  Plan Discussed with: CRNA and Anesthesiologist  Anesthesia Plan Comments:        Anesthesia Quick Evaluation

## 2022-10-27 NOTE — TOC Progression Note (Signed)
Transition of Care [facility]) - Progression Note    Patient Details  Name: Douglas [name] MRN: [id number] Date of Birth: 11-07-1976  Transition of Care [facility]) CM/SW Contact  Leone Haven, RN Phone Number: 10/27/2022, 3:47 PM  Clinical Narrative:    from home with family, picc placed today for CVP's, conts on amio drip, iv lasix. TOC following.         Expected Discharge Plan and Services                                                 Social Determinants of Health (SDOH) Interventions    Readmission Risk Interventions     No data to display

## 2022-10-27 NOTE — Progress Notes (Signed)
Advanced Heart Failure Rounding Note  PCP-Cardiologist: None   Subjective:    Echo 10/26/22: EF 20-25%, severely dilated LV, RV severely enlarged with low normal function, PASP 70 mmHg, severe BASE, moderate MR, moderate TR  PICC placed this am  Is/Os do not appear complete. Weight down another 5 lb.   BP elevated with narrow pulse pressure.  Complaining of anxiety this morning and trouble sleeping. Otherwise feels ok. Denies CP. Intt SOB 2/2 anxiety.   Objective:   Weight Range: 123.3 kg Body mass index is 33.08 kg/m.   Vital Signs:   Temp:  [97.7 F (36.5 C)-98 F (36.7 C)] 97.7 F (36.5 C) (12/13 0745) Pulse Rate:  [56-113] 97 (12/13 0745) Resp:  [15-27] 18 (12/13 0745) BP: (111-151)/(79-112) 122/101 (12/13 0745) SpO2:  [93 %-100 %] 95 % (12/13 0745) Weight:  [123.3 kg-125.2 kg] 123.3 kg (12/13 0621)    Weight change: Filed Weights   10/25/22 0815 10/26/22 1920 10/27/22 0621  Weight: 127 kg 125.2 kg 123.3 kg    Intake/Output:   Intake/Output Summary (Last 24 hours) at 10/27/2022 0832 Last data filed at 10/27/2022 0754 Gross per 24 hour  Intake 532.65 ml  Output 1250 ml  Net -717.35 ml     Physical Exam    General:  Well appearing. No resp difficulty HEENT: Normal Neck: Supple. JVP ~8. Carotids 2+ bilat; no bruits. No lymphadenopathy or thyromegaly appreciated. Cor: PMI nondisplaced. Regular rate & rhythm. No rubs, gallops or murmurs. Lungs: Clear Abdomen: Soft, nontender, nondistended. No hepatosplenomegaly. No bruits or masses. Good bowel sounds. Extremities: No cyanosis, clubbing, rash, non-pitting BLE edema. PICC RUE Neuro: Alert & orientedx3, cranial nerves grossly intact. moves all 4 extremities w/o difficulty. Affect pleasant  Telemetry   Sinus 90s, frequent NSVT runs up to 17 beats this am, 5-10 PVCs/min  Labs    CBC Recent Labs    10/25/22 1442 10/26/22 0420  WBC 6.9 8.1  NEUTROABS 3.7  --   HGB 16.2 15.7  HCT 46.0 47.0  MCV  83.6 86.4  PLT 274 271   Basic Metabolic Panel Recent Labs    57/84/69 0420 10/27/22 0106  NA 137 138  K 3.5 4.8  CL 105 103  CO2 21* 27  GLUCOSE 149* 132*  BUN 26* 32*  CREATININE 1.75* 1.87*  CALCIUM 8.7* 9.2  MG  --  2.5*   Liver Function Tests Recent Labs    10/25/22 1442 10/26/22 0420  AST 29 21  ALT 41 36  ALKPHOS 69 65  BILITOT 0.9 1.1  PROT 7.2 6.8  ALBUMIN 3.1* 3.0*   No results for input(s): "LIPASE", "AMYLASE" in the last 72 hours. Cardiac Enzymes No results for input(s): "CKTOTAL", "CKMB", "CKMBINDEX", "TROPONINI" in the last 72 hours.  BNP: BNP (last 3 results) Recent Labs    07/22/22 1452 10/18/22 1821 10/25/22 1442  BNP 557.4* 1,494.7* 1,428.2*    ProBNP (last 3 results) No results for input(s): "PROBNP" in the last 8760 hours.   D-Dimer No results for input(s): "DDIMER" in the last 72 hours. Hemoglobin A1C No results for input(s): "HGBA1C" in the last 72 hours. Fasting Lipid Panel No results for input(s): "CHOL", "HDL", "LDLCALC", "TRIG", "CHOLHDL", "LDLDIRECT" in the last 72 hours. Thyroid Function Tests No results for input(s): "TSH", "T4TOTAL", "T3FREE", "THYROIDAB" in the last 72 hours.  Invalid input(s): "FREET3"  Other results:   Imaging    [place] EKG SITE RITE  Result Date: 10/26/2022 If Site Rite image not attached, placement  could not be confirmed due to current cardiac rhythm.  ECHOCARDIOGRAM COMPLETE  Result Date: 10/26/2022    ECHOCARDIOGRAM REPORT   Patient Name:   Douglas [name] Date of Exam: 10/26/2022 Medical Rec #:  [id number]         Height:       76.0 in Accession #:    [id number]        Weight:       280.0 lb Date of Birth:  [birth year]        BSA:          2.556 m Patient Age:    46 years          BP:           122/101 mmHg Patient Gender: M                 HR:           112 bpm. Exam Location:  Inpatient Procedure: 2D Echo, Cardiac Doppler, Color Doppler and 3D Echo Indications:    CHF-Acute Systolic I50.21   History:        Patient has prior history of Echocardiogram examinations, most                 recent 03/02/2022. Cardiomyopathy, Defibrillator; Risk                 Factors:Hypertension and Diabetes.  Sonographer:    Leta Jungling RDCS Referring Phys: [id number] Cecille Po MELVIN  Sonographer Comments: Image acquisition challenging due to respiratory motion and coughing. IMPRESSIONS  1. Left ventricular ejection fraction, by estimation, is 20 to 25%. The left ventricle has severely decreased function. The left ventricle demonstrates global hypokinesis. The left ventricular internal cavity size was severely dilated. Left ventricular diastolic parameters are indeterminate.  2. Right ventricular systolic function is low normal. The right ventricular size is severely enlarged. There is severely elevated pulmonary artery systolic pressure.  3. Left atrial size was severely dilated.  4. Right atrial size was severely dilated.  5. The mitral valve is abnormal. Moderate mitral valve regurgitation. No evidence of mitral stenosis.  6. Tricuspid valve regurgitation is moderate.  7. The aortic valve is normal in structure. Aortic valve regurgitation is not visualized. No aortic stenosis is present. Comparison(s): RVSP appears worse, LVEF and stroke volume appear slightly worse. FINDINGS  Left Ventricle: Left ventricular ejection fraction, by estimation, is 20 to 25%. The left ventricle has severely decreased function. The left ventricle demonstrates global hypokinesis. The left ventricular internal cavity size was severely dilated. There is no left ventricular hypertrophy. Left ventricular diastolic parameters are indeterminate. Right Ventricle: The right ventricular size is severely enlarged. No increase in right ventricular wall thickness. Right ventricular systolic function is low normal. There is severely elevated pulmonary artery systolic pressure. The tricuspid regurgitant  velocity is 3.69 m/s, and with an assumed right  atrial pressure of 15 mmHg, the estimated right ventricular systolic pressure is 69.5 mmHg. Left Atrium: Left atrial size was severely dilated. Right Atrium: Right atrial size was severely dilated. Pericardium: There is no evidence of pericardial effusion. Mitral Valve: Normal LVCA on 2D planimetry. Blunted right sided pulmonary veins. The mitral valve is abnormal. Moderate mitral valve regurgitation. No evidence of mitral valve stenosis. MV peak gradient, 10.5 mmHg. The mean mitral valve gradient is 5.0 mmHg. Tricuspid Valve: The tricuspid valve is normal in structure. Tricuspid valve regurgitation is moderate. Aortic Valve: The aortic valve is normal in structure. Aortic valve regurgitation  is not visualized. No aortic stenosis is present. Pulmonic Valve: The pulmonic valve was normal in structure. Pulmonic valve regurgitation is mild. No evidence of pulmonic stenosis. Aorta: The aortic root and ascending aorta are structurally normal, with no evidence of dilitation. IAS/Shunts: No atrial level shunt detected by color flow Doppler. Additional Comments: A device lead is visualized in the right ventricle and right atrium.  LEFT VENTRICLE PLAX 2D LVIDd:         7.30 cm LVIDs:         6.60 cm LV PW:         0.90 cm LV IVS:        0.90 cm LVOT diam:     2.40 cm LV SV:         45 LV SV Index:   18 LVOT Area:     4.52 cm  RIGHT VENTRICLE RV Basal diam:  5.90 cm RV Mid diam:    4.20 cm RV S prime:     704.00 cm/s TAPSE (M-mode): 1.7 cm LEFT ATRIUM              Index        RIGHT ATRIUM           Index LA diam:        6.70 cm  2.62 cm/m   RA Area:     26.10 cm LA Vol (A2C):   117.0 ml 45.78 ml/m  RA Volume:   94.40 ml  36.94 ml/m LA Vol (A4C):   146.0 ml 57.13 ml/m LA Biplane Vol: 134.0 ml 52.43 ml/m  AORTIC VALVE LVOT Vmax:   69.15 cm/s LVOT Vmean:  49.900 cm/s LVOT VTI:    0.099 m  AORTA Ao Root diam: 2.90 cm Ao Asc diam:  3.00 cm MITRAL VALVE                  TRICUSPID VALVE MV Area (PHT): 5.88 cm       TR Peak  grad:   54.5 mmHg MV Area VTI:   1.61 cm       TR Vmax:        369.00 cm/s MV Peak grad:  10.5 mmHg MV Mean grad:  5.0 mmHg       SHUNTS MV Vmax:       1.62 m/s       Systemic VTI:  0.10 m MV Vmean:      101.0 cm/s     Systemic Diam: 2.40 cm MV Decel Time: 129 msec MR Peak grad:    96.0 mmHg MR Mean grad:    53.0 mmHg MR Vmax:         490.00 cm/s MR Vmean:        331.0 cm/s MR PISA:         3.08 cm MR PISA Eff ROA: 24 mm MR PISA Radius:  0.70 cm MV E velocity: 129.00 cm/s Riley Lam MD Electronically signed by Riley Lam MD Signature Date/Time: 10/26/2022/9:14:04 AM    Final      Medications:     Scheduled Medications:  aspirin  81 mg Oral Daily   atorvastatin  80 mg Oral Daily   carvedilol  3.125 mg Oral BID WC   dapagliflozin propanediol  10 mg Oral QAC breakfast   enoxaparin (LOVENOX) injection  60 mg Subcutaneous Q24H   furosemide  80 mg Intravenous BID   hydrALAZINE  10 mg Oral Q8H   insulin aspart  0-15 Units Subcutaneous TID  WC   insulin glargine-yfgn  15 Units Subcutaneous Daily   sodium chloride flush  3 mL Intravenous Q12H    Infusions:   PRN Medications: acetaminophen **OR** acetaminophen, albuterol, ALPRAZolam, guaiFENesin-dextromethorphan, melatonin, polyethylene glycol    Patient Profile   Mr. Tabar is a 46 year old with a history of HTN, hyperlipidemia, smoker, cocaine abuse,NSVT,  NICM, Medtronic ICD, and chronic HFrEF. Last used cocaine about a week ago.  EF has been down for some time.    Admitted recently with A/C HFrEF. Suspect he had residual congestion when discharged.  Readmitted 12/11 with A/C HFrEF.  Assessment/Plan   1. A/C HFrEF  - NICM. Has Medtronic ICD. 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   - Discharged last week and suspect he had residual congestion + increased fluid intake.  - BNP essentially the same as last week. Will need RHC prior to discharge ensure decongestion. Admitted with NYHA IV symptoms.  -  PICC currently being placed for CVP and CO-OX monitoring.  - Diuresing with IV lasix, continue through today. May transition to PO tomorrow - Continue low dose coreg.  - Hold entresto/spiro with creatinine close to 2.  - Continue farxiga - BP elevated. Increase hydralazine to 25 TID and add imdur 30. - Will start process for Furoscix after discharge.  - Strict I&O, daily weights   2. CKD Stage IIIb - Creatinine on admit 2. Baseline previously ~ 1.6.  - Cr 1.9 today. Follow closely with diuresis.   3. Substance Abuse - Last used cocaine about a week ago.  - Discussed cessation.    4. CAD - LHC 02/2022 nonobstructive CAD - LDL 124  - Increased atorvastatin to 80 mg daily    5. DMII -On SSI   6. HTN  -BP elevated -Increase hydralazine and add imdur   7. NSVT - Previously on amio but he stopped in April of this year.  I am not sure why he stopped.  - Frequent burst of NSVT noted on device since the beginning of December.  - Frequent NSVT on tele this am. Start IV amio. - Continue current dose of coreg.    8. Obesity  - BMI 33   9. MR  - Once diuresed will need TEE to further assess severity.    Length of Stay: 2  Brynda Peon, AGACNP-BC  10/27/2022, 8:32 AM  Advanced Heart Failure Team Pager [phone] (M-F; 7a - 5p)  Please contact [facility] for night-coverage after hours (5p -7a ) and weekends on [email or web address]

## 2022-10-27 NOTE — Progress Notes (Signed)
Refused cpap.

## 2022-10-27 NOTE — Progress Notes (Signed)
Douglas NOTE    Douglas [name]  HCW:[id number] DOB: 27-Aug-1976 DOA: 10/25/2022 PCP: Claiborne Rigg, NP   46/M with chronic systolic CHF-EF 20%, NICM,ICD, hypertension, hyperlipidemia, obesity, cocaine use,  nonobstructive coronary artery disease presented to the emergency room with shortness of breath for 1-2 days, DC from [facility] on 12/7 off entresto, Dc weight was 272lbs. In ED, creatinine of 1.96,  Troponin -120  BNP -1428.  Chest x-ray showed prominent pulmonary vasculature, weight up to 280lbs. -Admitted, started on diuretics, CHF team following  Subjective: -Did not sleep well last night, some dyspnea reported overnight, felt anxious  Assessment and Plan:  Acute on chronic systolic heart failure: NICM -Presented with DoE, orthopnea and weight gain. -last echo 4/23 ECHO w ejection fraction 25-30%, mod reduced RV.  -weight up 8lbs since recent DC -CHF team following, on IV Lasix, 2.5 L negative -PICC line being placed this morning for CVP and Co. ox monitoring -continue Farxiga, hydralazine, starting Imdur -resume entresto if creatinine continues to improve -Plan for RHC prior to discharge   AKI on CKD 3a -Baseline creatinine around 1.5-1.6, creatinine 1.9 on admission -Diuretics as above, Entresto on hold  NSVT -has ICD, continue Coreg -Starting IV Amio today, potassium and mag are stable   Type 2 diabetes on insulin:  -Continue glargine, CBGs are stable   Troponin elevation: Mild troponin elevation secondary to demand ischemia, CHF -Cardiac cath in April with mild coronary artery disease.   -Continue aspirin, Coreg and Lipitor.    Cocaine use: Counseled.  -denies any use since recent DC   DVT prophylaxis:lovenox Code Status: Full COde Family Communication: None present Disposition Plan: Home likely 48h  Consultants:    Procedures:   Antimicrobials:    Objective: Vitals:   10/26/22 1830 10/26/22 1920 10/27/22 0621 10/27/22 0745  BP: (!) 135/93  (!)  128/91 (!) 122/101  Pulse: 93  80 97  Resp: 15 (!) 22 20 18   Temp:  97.9 F (36.6 C) 98 F (36.7 C) 97.7 F (36.5 C)  TempSrc:  Oral Oral Oral  SpO2: 95% 93% 95% 95%  Weight:  125.2 kg 123.3 kg   Height:  6\' 4"  (1.93 m)      Intake/Output Summary (Last 24 hours) at 10/27/2022 1004 Last data filed at 10/27/2022 0754 Gross per 24 hour  Intake 532.65 ml  Output 1150 ml  Net -617.35 ml   Filed Weights   10/25/22 0815 10/26/22 1920 10/27/22 0621  Weight: 127 kg 125.2 kg 123.3 kg    Examination:  Gen: Pleasant male sitting up in bed, AAOx3, no distress HEENT: Positive JVD CVS: S1-S2, regular rhythm Lungs: Decreased breath sounds at the bases Abdomen: Soft, nontender, bowel sounds present Extremities: No edema  Abd: soft, Non tender, non distended, BS present Extremities: No edema Skin: no new rashes on exposed skin    Data Reviewed:   CBC: Recent Labs  Lab 10/21/22 0107 10/25/22 1442 10/26/22 0420  WBC 8.8 6.9 8.1  NEUTROABS  --  3.7  --   HGB 17.1* 16.2 15.7  HCT 48.5 46.0 47.0  MCV 83.2 83.6 86.4  PLT 204 274 271   Basic Metabolic Panel: Recent Labs  Lab 10/21/22 0107 10/25/22 1442 10/26/22 0420 10/27/22 0106  NA 138 137 137 138  K 3.7 4.1 3.5 4.8  CL 97* 103 105 103  CO2 29 21* 21* 27  GLUCOSE 104* 151* 149* 132*  BUN 30* 29* 26* 32*  CREATININE 2.03* 1.96* 1.75* 1.87*  CALCIUM  9.0 8.7* 8.7* 9.2  MG  --   --   --  2.5*   GFR: Estimated Creatinine Clearance: 69.3 mL/min (A) (by C-G formula based on SCr of 1.87 mg/dL (H)). Liver Function Tests: Recent Labs  Lab 10/25/22 1442 10/26/22 0420  AST 29 21  ALT 41 36  ALKPHOS 69 65  BILITOT 0.9 1.1  PROT 7.2 6.8  ALBUMIN 3.1* 3.0*   No results for input(s): "LIPASE", "AMYLASE" in the last 168 hours. No results for input(s): "AMMONIA" in the last 168 hours. Coagulation Profile: No results for input(s): "INR", "PROTIME" in the last 168 hours. Cardiac Enzymes: No results for input(s):  "CKTOTAL", "CKMB", "CKMBINDEX", "TROPONINI" in the last 168 hours. BNP (last 3 results) No results for input(s): "PROBNP" in the last 8760 hours. HbA1C: No results for input(s): "HGBA1C" in the last 72 hours. CBG: Recent Labs  Lab 10/26/22 0751 10/26/22 1326 10/26/22 1651 10/26/22 2141 10/27/22 0614  GLUCAP 161* 177* 163* 178* 133*   Lipid Profile: No results for input(s): "CHOL", "HDL", "LDLCALC", "TRIG", "CHOLHDL", "LDLDIRECT" in the last 72 hours. Thyroid Function Tests: No results for input(s): "TSH", "T4TOTAL", "FREET4", "T3FREE", "THYROIDAB" in the last 72 hours. Anemia Panel: No results for input(s): "VITAMINB12", "FOLATE", "FERRITIN", "TIBC", "IRON", "RETICCTPCT" in the last 72 hours. Urine analysis:    Component Value Date/Time   COLORURINE STRAW (A) 06/11/2020 1423   APPEARANCEUR CLEAR 06/11/2020 1423   LABSPEC <=1.005 06/26/2021 1229   PHURINE 6.0 06/26/2021 1229   GLUCOSEU 250 (A) 06/26/2021 1229   HGBUR TRACE (A) 06/26/2021 1229   BILIRUBINUR NEGATIVE 06/26/2021 1229   KETONESUR NEGATIVE 06/26/2021 1229   PROTEINUR NEGATIVE 06/26/2021 1229   UROBILINOGEN 0.2 06/26/2021 1229   NITRITE NEGATIVE 06/26/2021 1229   LEUKOCYTESUR NEGATIVE 06/26/2021 1229   Sepsis Labs: @LABRCNTIP (procalcitonin:4,lacticidven:4)  ) Recent Results (from the past 240 hour(s))  Respiratory (~20 pathogens) panel by PCR     Status: None   Collection Time: 10/26/22  5:43 PM   Specimen: Nasopharyngeal Swab; Respiratory  Result Value Ref Range Status   Adenovirus NOT DETECTED NOT DETECTED Final   Coronavirus 229E NOT DETECTED NOT DETECTED Final    Comment: (NOTE) The Coronavirus on the Respiratory Panel, DOES NOT test for the novel  Coronavirus (2019 nCoV)    Coronavirus HKU1 NOT DETECTED NOT DETECTED Final   Coronavirus NL63 NOT DETECTED NOT DETECTED Final   Coronavirus OC43 NOT DETECTED NOT DETECTED Final   Metapneumovirus NOT DETECTED NOT DETECTED Final   Rhinovirus /  Enterovirus NOT DETECTED NOT DETECTED Final   Influenza A NOT DETECTED NOT DETECTED Final   Influenza B NOT DETECTED NOT DETECTED Final   Parainfluenza Virus 1 NOT DETECTED NOT DETECTED Final   Parainfluenza Virus 2 NOT DETECTED NOT DETECTED Final   Parainfluenza Virus 3 NOT DETECTED NOT DETECTED Final   Parainfluenza Virus 4 NOT DETECTED NOT DETECTED Final   Respiratory Syncytial Virus NOT DETECTED NOT DETECTED Final   Bordetella pertussis NOT DETECTED NOT DETECTED Final   Bordetella Parapertussis NOT DETECTED NOT DETECTED Final   Chlamydophila pneumoniae NOT DETECTED NOT DETECTED Final   Mycoplasma pneumoniae NOT DETECTED NOT DETECTED Final    Comment: Performed at [facility], [address], [place], [place]     Radiology Studies: 17616 EKG SITE RITE  Result Date: 10/26/2022 If Site Rite image not attached, placement could not be confirmed due to current cardiac rhythm.  ECHOCARDIOGRAM COMPLETE  Result Date: 10/26/2022    ECHOCARDIOGRAM REPORT  Patient Name:   PEARSE [name] Date of Exam: 10/26/2022 Medical Rec #:  [id number]         Height:       76.0 in Accession #:    [id number]        Weight:       280.0 lb Date of Birth:  1976/01/21        BSA:          2.556 m Patient Age:    46 years          BP:           122/101 mmHg Patient Gender: M                 HR:           112 bpm. Exam Location:  Inpatient Procedure: 2D Echo, Cardiac Doppler, Color Doppler and 3D Echo Indications:    CHF-Acute Systolic I50.21  History:        Patient has prior history of Echocardiogram examinations, most                 recent 03/02/2022. Cardiomyopathy, Defibrillator; Risk                 Factors:Hypertension and Diabetes.  Sonographer:    Leta Jungling RDCS Referring Phys: [id number] Cecille Po MELVIN  Sonographer Comments: Image acquisition challenging due to respiratory motion and coughing. IMPRESSIONS  1. Left ventricular ejection fraction, by estimation, is 20 to 25%. The left  ventricle has severely decreased function. The left ventricle demonstrates global hypokinesis. The left ventricular internal cavity size was severely dilated. Left ventricular diastolic parameters are indeterminate.  2. Right ventricular systolic function is low normal. The right ventricular size is severely enlarged. There is severely elevated pulmonary artery systolic pressure.  3. Left atrial size was severely dilated.  4. Right atrial size was severely dilated.  5. The mitral valve is abnormal. Moderate mitral valve regurgitation. No evidence of mitral stenosis.  6. Tricuspid valve regurgitation is moderate.  7. The aortic valve is normal in structure. Aortic valve regurgitation is not visualized. No aortic stenosis is present. Comparison(s): RVSP appears worse, LVEF and stroke volume appear slightly worse. FINDINGS  Left Ventricle: Left ventricular ejection fraction, by estimation, is 20 to 25%. The left ventricle has severely decreased function. The left ventricle demonstrates global hypokinesis. The left ventricular internal cavity size was severely dilated. There is no left ventricular hypertrophy. Left ventricular diastolic parameters are indeterminate. Right Ventricle: The right ventricular size is severely enlarged. No increase in right ventricular wall thickness. Right ventricular systolic function is low normal. There is severely elevated pulmonary artery systolic pressure. The tricuspid regurgitant  velocity is 3.69 m/s, and with an assumed right atrial pressure of 15 mmHg, the estimated right ventricular systolic pressure is 69.5 mmHg. Left Atrium: Left atrial size was severely dilated. Right Atrium: Right atrial size was severely dilated. Pericardium: There is no evidence of pericardial effusion. Mitral Valve: Normal LVCA on 2D planimetry. Blunted right sided pulmonary veins. The mitral valve is abnormal. Moderate mitral valve regurgitation. No evidence of mitral valve stenosis. MV peak gradient,  10.5 mmHg. The mean mitral valve gradient is 5.0 mmHg. Tricuspid Valve: The tricuspid valve is normal in structure. Tricuspid valve regurgitation is moderate. Aortic Valve: The aortic valve is normal in structure. Aortic valve regurgitation is not visualized. No aortic stenosis is present. Pulmonic Valve: The pulmonic valve was normal in structure. Pulmonic valve regurgitation is mild. No  evidence of pulmonic stenosis. Aorta: The aortic root and ascending aorta are structurally normal, with no evidence of dilitation. IAS/Shunts: No atrial level shunt detected by color flow Doppler. Additional Comments: A device lead is visualized in the right ventricle and right atrium.  LEFT VENTRICLE PLAX 2D LVIDd:         7.30 cm LVIDs:         6.60 cm LV PW:         0.90 cm LV IVS:        0.90 cm LVOT diam:     2.40 cm LV SV:         45 LV SV Index:   18 LVOT Area:     4.52 cm  RIGHT VENTRICLE RV Basal diam:  5.90 cm RV Mid diam:    4.20 cm RV S prime:     704.00 cm/s TAPSE (M-mode): 1.7 cm LEFT ATRIUM              Index        RIGHT ATRIUM           Index LA diam:        6.70 cm  2.62 cm/m   RA Area:     26.10 cm LA Vol (A2C):   117.0 ml 45.78 ml/m  RA Volume:   94.40 ml  36.94 ml/m LA Vol (A4C):   146.0 ml 57.13 ml/m LA Biplane Vol: 134.0 ml 52.43 ml/m  AORTIC VALVE LVOT Vmax:   69.15 cm/s LVOT Vmean:  49.900 cm/s LVOT VTI:    0.099 m  AORTA Ao Root diam: 2.90 cm Ao Asc diam:  3.00 cm MITRAL VALVE                  TRICUSPID VALVE MV Area (PHT): 5.88 cm       TR Peak grad:   54.5 mmHg MV Area VTI:   1.61 cm       TR Vmax:        369.00 cm/s MV Peak grad:  10.5 mmHg MV Mean grad:  5.0 mmHg       SHUNTS MV Vmax:       1.62 m/s       Systemic VTI:  0.10 m MV Vmean:      101.0 cm/s     Systemic Diam: 2.40 cm MV Decel Time: 129 msec MR Peak grad:    96.0 mmHg MR Mean grad:    53.0 mmHg MR Vmax:         490.00 cm/s MR Vmean:        331.0 cm/s MR PISA:         3.08 cm MR PISA Eff ROA: 24 mm MR PISA Radius:  0.70 cm MV E  velocity: 129.00 cm/s Riley Lam MD Electronically signed by Riley Lam MD Signature Date/Time: 10/26/2022/9:14:04 AM    Final      Scheduled Meds:  amiodarone  150 mg Intravenous Once   aspirin  81 mg Oral Daily   atorvastatin  80 mg Oral Daily   carvedilol  3.125 mg Oral BID WC   Chlorhexidine Gluconate Cloth  6 each Topical Daily   dapagliflozin propanediol  10 mg Oral QAC breakfast   enoxaparin (LOVENOX) injection  60 mg Subcutaneous Q24H   furosemide  80 mg Intravenous BID   hydrALAZINE  25 mg Oral Q8H   insulin aspart  0-15 Units Subcutaneous TID WC   insulin glargine-yfgn  15 Units Subcutaneous Daily  isosorbide mononitrate  30 mg Oral Daily   sodium chloride flush  10-40 mL Intracatheter Q12H   sodium chloride flush  3 mL Intravenous Q12H   Continuous Infusions:  amiodarone 60 mg/hr (10/27/22 1002)   Followed by   amiodarone       LOS: 2 days    Time spent:    Zannie Cove, MD [facility]   10/27/2022, 10:04 AM

## 2022-10-27 NOTE — Progress Notes (Signed)
Called to pts room with BiPAP orders. Pt sitting on side of the bed in no respiratory distress. Pt stated he was not having any trouble breathing. Checked pulse ox which was 100% and all other vitals stable.

## 2022-10-28 ENCOUNTER — Inpatient Hospital Stay (HOSPITAL_COMMUNITY): Payer: Medicaid Other

## 2022-10-28 ENCOUNTER — Inpatient Hospital Stay (HOSPITAL_COMMUNITY): Payer: Medicaid Other | Admitting: Anesthesiology

## 2022-10-28 ENCOUNTER — Encounter (HOSPITAL_COMMUNITY)
Admission: EM | Disposition: A | Discharge status: Hospice | Payer: Self-pay | Source: Home / Self Care | Attending: Pulmonary Disease

## 2022-10-28 ENCOUNTER — Encounter (HOSPITAL_COMMUNITY): Payer: Self-pay | Admitting: Internal Medicine

## 2022-10-28 DIAGNOSIS — I5023 Acute on chronic systolic (congestive) heart failure: Secondary | ICD-10-CM | POA: Diagnosis not present

## 2022-10-28 DIAGNOSIS — I110 Hypertensive heart disease with heart failure: Secondary | ICD-10-CM

## 2022-10-28 DIAGNOSIS — I088 Other rheumatic multiple valve diseases: Secondary | ICD-10-CM

## 2022-10-28 DIAGNOSIS — I509 Heart failure, unspecified: Secondary | ICD-10-CM | POA: Diagnosis not present

## 2022-10-28 DIAGNOSIS — I2510 Atherosclerotic heart disease of native coronary artery without angina pectoris: Secondary | ICD-10-CM | POA: Diagnosis not present

## 2022-10-28 DIAGNOSIS — I340 Nonrheumatic mitral (valve) insufficiency: Secondary | ICD-10-CM

## 2022-10-28 DIAGNOSIS — F17210 Nicotine dependence, cigarettes, uncomplicated: Secondary | ICD-10-CM

## 2022-10-28 HISTORY — PX: TEE WITHOUT CARDIOVERSION: SHX5443

## 2022-10-28 LAB — CBC
HCT: 44.8 % (ref 39.0–52.0)
Hemoglobin: 15.8 g/dL (ref 13.0–17.0)
MCH: 29.5 pg (ref 26.0–34.0)
MCHC: 35.3 g/dL (ref 30.0–36.0)
MCV: 83.6 fL (ref 80.0–100.0)
Platelets: 279 10*3/uL (ref 150–400)
RBC: 5.36 MIL/uL (ref 4.22–5.81)
RDW: 12.7 % (ref 11.5–15.5)
WBC: 8.9 10*3/uL (ref 4.0–10.5)
nRBC: 0 % (ref 0.0–0.2)

## 2022-10-28 LAB — GLUCOSE, CAPILLARY
Glucose-Capillary: 144 mg/dL — ABNORMAL HIGH (ref 70–99)
Glucose-Capillary: 227 mg/dL — ABNORMAL HIGH (ref 70–99)
Glucose-Capillary: 145 mg/dL — ABNORMAL HIGH (ref 70–99)
Glucose-Capillary: 143 mg/dL — ABNORMAL HIGH (ref 70–99)

## 2022-10-28 LAB — COOXEMETRY PANEL
Carboxyhemoglobin: 1.9 % — ABNORMAL HIGH (ref 0.5–1.5)
Methemoglobin: <0.7 % (ref 0.0–1.5)
O2 Saturation: 64.4 %
Total hemoglobin: 15.8 g/dL (ref 12.0–16.0)

## 2022-10-28 LAB — BASIC METABOLIC PANEL
Anion gap: 11 (ref 5–15)
BUN: 41 mg/dL — ABNORMAL HIGH (ref 6–20)
CO2: 27 mmol/L (ref 22–32)
Calcium: 8.5 mg/dL — ABNORMAL LOW (ref 8.9–10.3)
Chloride: 99 mmol/L (ref 98–111)
Creatinine, Ser: 2.05 mg/dL — ABNORMAL HIGH (ref 0.61–1.24)
GFR, Estimated: 39 mL/min — ABNORMAL LOW (ref >60)
Glucose, Bld: 123 mg/dL — ABNORMAL HIGH (ref 70–99)
Potassium: 3.1 mmol/L — ABNORMAL LOW (ref 3.5–5.1)
Sodium: 137 mmol/L (ref 135–145)

## 2022-10-28 LAB — PROCALCITONIN: Procalcitonin: <0.1 ng/mL

## 2022-10-28 LAB — LACTIC ACID, PLASMA: Lactic Acid, Venous: 0.8 mmol/L (ref 0.5–1.9)

## 2022-10-28 LAB — BRAIN NATRIURETIC PEPTIDE: B Natriuretic Peptide: 491.6 pg/mL — ABNORMAL HIGH (ref 0.0–100.0)

## 2022-10-28 SURGERY — ECHOCARDIOGRAM, TRANSESOPHAGEAL
Anesthesia: Monitor Anesthesia Care

## 2022-10-28 MED ORDER — LIDOCAINE 2% (20 MG/ML) 5 ML SYRINGE
INTRAMUSCULAR | Status: DC | PRN
  Administered 2022-10-28: 100 mg via INTRAVENOUS

## 2022-10-28 MED ORDER — POTASSIUM CHLORIDE CRYS ER 20 MEQ PO TBCR
40.0000 meq | EXTENDED_RELEASE_TABLET | Freq: Once | ORAL | Status: AC
  Administered 2022-10-28: 40 meq via ORAL
  Filled 2022-10-28: qty 2

## 2022-10-28 MED ORDER — PROPOFOL 10 MG/ML IV BOLUS
INTRAVENOUS | Status: DC | PRN
  Administered 2022-10-28: 30 mg via INTRAVENOUS
  Administered 2022-10-28 (×2): 20 mg via INTRAVENOUS

## 2022-10-28 MED ORDER — PHENYLEPHRINE 80 MCG/ML (10ML) SYRINGE FOR IV PUSH (FOR BLOOD PRESSURE SUPPORT)
PREFILLED_SYRINGE | INTRAVENOUS | Status: DC | PRN
  Administered 2022-10-28: 160 ug via INTRAVENOUS

## 2022-10-28 MED ORDER — PROPOFOL 500 MG/50ML IV EMUL
INTRAVENOUS | Status: DC | PRN
  Administered 2022-10-28: 100 ug/kg/min via INTRAVENOUS

## 2022-10-28 MED ORDER — POTASSIUM CHLORIDE CRYS ER 20 MEQ PO TBCR
20.0000 meq | EXTENDED_RELEASE_TABLET | Freq: Once | ORAL | Status: AC
  Administered 2022-10-28: 20 meq via ORAL
  Filled 2022-10-28: qty 1

## 2022-10-28 NOTE — Transfer of Care (Signed)
Immediate Anesthesia Transfer of Care Note  Patient: Douglas [name]  Procedure(s) Performed: TRANSESOPHAGEAL ECHOCARDIOGRAM (TEE)  Patient Location: Endoscopy Unit  Anesthesia Type:MAC  Level of Consciousness: drowsy and patient cooperative  Airway & Oxygen Therapy: Patient Spontanous Breathing and Patient connected to nasal cannula oxygen  Post-op Assessment: Report given to RN and Post -op Vital signs reviewed and stable  Post vital signs: Reviewed and stable  Last Vitals:  Vitals Value Taken Time  BP 122/80   Temp    Pulse 82   Resp 14   SpO2 96%     Last Pain:  Vitals:   10/28/22 0704  TempSrc: Temporal  PainSc: 0-No pain         Complications: No notable events documented.

## 2022-10-28 NOTE — CV Procedure (Signed)
    TRANSESOPHAGEAL ECHOCARDIOGRAM   NAME:  BARRY FAIRCLOTH   MRN: [id number] DOB:  05/25/1976   ADMIT DATE: 10/25/2022  INDICATIONS:  Mitral regurgitation   PROCEDURE:   Informed consent was obtained prior to the procedure. The risks, benefits and alternatives for the procedure were discussed and the patient comprehended these risks.  Risks include, but are not limited to, cough, sore throat, vomiting, nausea, somnolence, esophageal and stomach trauma or perforation, bleeding, low blood pressure, aspiration, pneumonia, infection, trauma to the teeth and death.    After a procedural time-out, the patient was sedated by anesthesia service. The transesophageal probe was inserted in the esophagus and stomach without difficulty and multiple views were obtained.    COMPLICATIONS:    There were no immediate complications.  FINDINGS:  LEFT VENTRICLE: Dilated EF = 20%. Global HK  RIGHT VENTRICLE: Moderately reduced  LEFT ATRIUM: Severely dilated with smoke  LEFT ATRIAL APPENDAGE: No thrombus.   RIGHT ATRIUM: Moderately dilated  AORTIC VALVE:  Trileaflet. Mildly calcified No AI  MITRAL VALVE:    Normal. Severe central functional MR with mild restriction of posterior leaflet. No significant flow reversal in pulmonary veins  TRICUSPID VALVE: Normal. Moderate TR  PULMONIC VALVE: Grossly normal. Trivial PI  INTERATRIAL SEPTUM: No PFO or ASD.  PERICARDIUM: No effusion  DESCENDING AORTA: Mild plque   CONCLUSION:   Corri Delapaz,MD 8:19 AM

## 2022-10-28 NOTE — Interval H&P Note (Signed)
History and Physical Interval Note:  10/28/2022 7:25 AM  Douglas [name]  has presented today for surgery, with the diagnosis of moderate MR.  The various methods of treatment have been discussed with the patient and family. After consideration of risks, benefits and other options for treatment, the patient has consented to  Procedure(s): TRANSESOPHAGEAL ECHOCARDIOGRAM (TEE) (N/A) as a surgical intervention.  The patient's history has been reviewed, patient examined, no change in status, stable for surgery.  I have reviewed the patient's chart and labs.  Questions were answered to the patient's satisfaction.     Kionna Brier

## 2022-10-28 NOTE — Progress Notes (Signed)
Informed on-call hospitalist of bmet results (potassium 3.1) and TEE scheduled for 0730. Received order for PO potassium and care order to make exception to NPO for said dose. Provided to patient as ordered.

## 2022-10-28 NOTE — H&P (View-Only) (Signed)
Advanced Heart Failure Rounding Note  PCP-Cardiologist: None   Subjective:    Echo 10/26/22: EF 20-25%, severely dilated LV, RV severely enlarged with low normal function, PASP 70 mmHg, severe BASE, moderate MR, moderate TR  Started on milrinone yesterday for co-ox 53% with resp distress and poor urine output. Diuresis much improved. Over 4L out   ON IV amio for NSVT  K 3.1 this am and rec'd 40 kcl   Co-ox 64%. Scr 1.8 -> 2.0    Objective:   Weight Range: 121.2 kg Body mass index is 32.54 kg/m.   Vital Signs:   Temp:  [97.6 F (36.4 C)-98.5 F (36.9 C)] 98.3 F (36.8 C) (12/14 0704) Pulse Rate:  [65-99] 74 (12/14 0704) Resp:  [18-20] 20 (12/14 0704) BP: (92-136)/(70-101) 115/93 (12/14 0704) SpO2:  [94 %-96 %] 96 % (12/14 0704) Weight:  [121.2 kg] 121.2 kg (12/14 0207)    Weight change: Filed Weights   10/26/22 1920 10/27/22 0621 10/28/22 0207  Weight: 125.2 kg 123.3 kg 121.2 kg    Intake/Output:   Intake/Output Summary (Last 24 hours) at 10/28/2022 0717 Last data filed at 10/28/2022 0600 Gross per 24 hour  Intake 1561.98 ml  Output 4250 ml  Net -2688.02 ml      Physical Exam    General:  Well appearing. No resp difficulty HEENT: normal Neck: supple. no JVD. Carotids 2+ bilat; no bruits. No lymphadenopathy or thryomegaly appreciated. Cor: PMI nondisplaced. Regular rate & rhythm. 2/6 MR Lungs: clear Abdomen: obese soft, nontender, nondistended. No hepatosplenomegaly. No bruits or masses. Good bowel sounds. Extremities: no cyanosis, clubbing, rash, edema Neuro: alert & orientedx3, cranial nerves grossly intact. moves all 4 extremities w/o difficulty. Affect pleasant  Telemetry   Sinus 70-80s Personally reviewed   Labs    CBC Recent Labs    10/25/22 1442 10/26/22 0420  WBC 6.9 8.1  NEUTROABS 3.7  --   HGB 16.2 15.7  HCT 46.0 47.0  MCV 83.6 86.4  PLT 274 271    Basic Metabolic Panel Recent Labs    85/46/27 0106 10/28/22 0325  NA  138 137  K 4.8 3.1*  CL 103 99  CO2 27 27  GLUCOSE 132* 123*  BUN 32* 41*  CREATININE 1.87* 2.05*  CALCIUM 9.2 8.5*  MG 2.5*  --     Liver Function Tests Recent Labs    10/25/22 1442 10/26/22 0420  AST 29 21  ALT 41 36  ALKPHOS 69 65  BILITOT 0.9 1.1  PROT 7.2 6.8  ALBUMIN 3.1* 3.0*    No results for input(s): "LIPASE", "AMYLASE" in the last 72 hours. Cardiac Enzymes No results for input(s): "CKTOTAL", "CKMB", "CKMBINDEX", "TROPONINI" in the last 72 hours.  BNP: BNP (last 3 results) Recent Labs    07/22/22 1452 10/18/22 1821 10/25/22 1442  BNP 557.4* 1,494.7* 1,428.2*     ProBNP (last 3 results) No results for input(s): "PROBNP" in the last 8760 hours.   D-Dimer No results for input(s): "DDIMER" in the last 72 hours. Hemoglobin A1C No results for input(s): "HGBA1C" in the last 72 hours. Fasting Lipid Panel No results for input(s): "CHOL", "HDL", "LDLCALC", "TRIG", "CHOLHDL", "LDLDIRECT" in the last 72 hours. Thyroid Function Tests No results for input(s): "TSH", "T4TOTAL", "T3FREE", "THYROIDAB" in the last 72 hours.  Invalid input(s): "FREET3"  Other results:   Imaging    No results found.   Medications:     Scheduled Medications:  [MAR Hold] aspirin  81 mg Oral Daily   [  MAR Hold] atorvastatin  80 mg Oral Daily   [MAR Hold] Chlorhexidine Gluconate Cloth  6 each Topical Daily   [MAR Hold] dapagliflozin propanediol  10 mg Oral QAC breakfast   [MAR Hold] enoxaparin (LOVENOX) injection  60 mg Subcutaneous Q24H   [MAR Hold] furosemide  80 mg Intravenous BID   [MAR Hold] hydrALAZINE  25 mg Oral Q8H   [MAR Hold] insulin aspart  0-15 Units Subcutaneous TID WC   [MAR Hold] insulin glargine-yfgn  15 Units Subcutaneous Daily   [MAR Hold] sodium chloride flush  10-40 mL Intracatheter Q12H   [MAR Hold] sodium chloride flush  3 mL Intravenous Q12H    Infusions:  sodium chloride 20 mL/hr at 10/28/22 0322   amiodarone 30 mg/hr (10/27/22 2116)    milrinone 0.25 mcg/kg/min (10/28/22 0622)    PRN Medications: [MAR Hold] acetaminophen **OR** [MAR Hold] acetaminophen, [MAR Hold] albuterol, [MAR Hold] ALPRAZolam, [MAR Hold] guaiFENesin-dextromethorphan, [MAR Hold] melatonin, [MAR Hold] polyethylene glycol, [MAR Hold] sodium chloride flush    Patient Profile   Douglas [name] is a 46 year old with a history of HTN, hyperlipidemia, smoker, cocaine abuse,NSVT,  NICM, Medtronic ICD, and chronic HFrEF. Last used cocaine about a week ago.  EF has been down for some time.    Admitted recently with A/C HFrEF. Suspect he had residual congestion when discharged.  Readmitted 12/11 with A/C HFrEF.  Assessment/Plan   1. A/C HFrEF  - NICM. Has Medtronic ICD. 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   - Discharged last week and suspect he had residual congestion + increased fluid intake.  -  Admitted with NYHA IV symptoms.  - On milrinone 0.25 Co-ox 53% -> 64% - Off carvedilol with low output - Holding entresto/spiro with creatinine close to 2.  - Continue farxiga - Continue hydral/imdur - Continue IV diuresis - Will start process for Furoscix after discharge.  - Strict I&O, daily weights   2. AKI on CKD Stage IIIb - Creatinine on admit 2. Baseline previously ~ 1.6.  - Cr 1.9 -> 2.1 today. Follow closely with diuresis.   3. Substance Abuse - Last used cocaine about a week ago.  - Discussed cessation.  - not candidate for advanced therapies   4. CAD - LHC 02/2022 nonobstructive CAD - LDL 124  - Increased atorvastatin to 80 mg daily    5. DMII -On SSI   6. HTN  -BPimproved   7. NSVT - Previously on amio but he stopped in April of this year.  I am not sure why he stopped.  - Frequent burst of NSVT noted on device since the beginning of December.  - Frequent NSVT on tele this am. Now on IV amio - Keep K > 4.0 Mg > 2.0  - Continue current dose of coreg.    8. Obesity  - Body mass index is 32.54 kg/m.   9. MR  - TEE  today    Length of Stay: 3  [name], MD  7:24 AM  Advanced Heart Failure Team Pager [phone] (M-F; 7a - 5p)  Please contact [facility] for night-coverage after hours (5p -7a ) and weekends on [email or web address]

## 2022-10-28 NOTE — Progress Notes (Signed)
PROGRESS NOTE    Douglas [name]  BTD:[id number] DOB: 1975/12/15 DOA: 10/25/2022 PCP: Claiborne Rigg, NP   46/M with chronic systolic CHF-EF 20%, NICM,ICD, hypertension, hyperlipidemia, obesity, cocaine use,  nonobstructive coronary artery disease presented to the emergency room with shortness of breath for 1-2 days, DC from [facility] on 12/7 off entresto, Dc weight was 272lbs. In ED, creatinine of 1.96,  Troponin -120  BNP -1428.  Chest x-ray showed prominent pulmonary vasculature, weight up to 280lbs. -Admitted, started on diuretics, CHF team following  Subjective: Patient returned from TEE.  Denies any complaints currently.  Assessment and Plan:  Acute on chronic systolic heart failure: NICM -Presented with DoE, orthopnea and weight gain. -last echo 4/23 ECHO w ejection fraction 25-30%, mod reduced RV.  -weight up 8lbs since recent DC Seen by heart failure team.  Placed on IV Lasix.  PICC line placed and started on milrinone. Rising creatinine noted.  IV Lasix has been held for now. Further management per heart failure team. Also noted to be on Farxiga, hydralazine, statin.ve Underwent TEE which showed 20% EF with global hypokinesis.  LA was severely dilated.  No thrombus noted in the left atrium.  Severe MR was noted.   AKI on CKD 3a/hypokalemia -Baseline creatinine around 1.5-1.6, creatinine 1.9 on admission Diuretics placed on hold today.  Entresto on hold. Creatinine noted to be 2.05 today. Potassium being supplemented.  NSVT -has ICD, continue Coreg On amiodarone infusion.   Type 2 diabetes on insulin:  -Continue glargine, CBGs are stable   Troponin elevation:  Mild troponin elevation secondary to demand ischemia, CHF -Cardiac cath in April with mild coronary artery disease.   -Continue aspirin, Coreg and Lipitor.    Cocaine use: Counseled.  -denies any use since recent DC   DVT prophylaxis:lovenox Code Status: Full Code Family Communication: None  present Disposition Plan: Home when medically stable.  Consultants: Heart failure team  Procedures: TEE  Antimicrobials:    Objective: Vitals:   10/28/22 0840 10/28/22 0850 10/28/22 0943 10/28/22 1039  BP: 121/73 (!) 134/91 (!) 125/92 117/87  Pulse: 85 86 83   Resp: (!) 21 13 20 18   Temp:   97.8 F (36.6 C)   TempSrc:   Oral   SpO2: 98% 97% 97% 98%  Weight:      Height:        Intake/Output Summary (Last 24 hours) at 10/28/2022 1049 Last data filed at 10/28/2022 1038 Gross per 24 hour  Intake 1861.98 ml  Output 4225 ml  Net -2363.02 ml    Filed Weights   10/26/22 1920 10/27/22 0621 10/28/22 0207  Weight: 125.2 kg 123.3 kg 121.2 kg    Examination:  General appearance: Awake alert.  In no distress Resp: Clear to auscultation bilaterally.  Normal effort Cardio: S1-S2 is normal regular.  No S3-S4.  No rubs murmurs or bruit GI: Abdomen is soft.  Nontender nondistended.  Bowel sounds are present normal.  No masses organomegaly Extremities: No edema.  Full range of motion of lower extremities. Neurologic: Alert and oriented x3.  No focal neurological deficits.     Data Reviewed:   CBC: Recent Labs  Lab 10/25/22 1442 10/26/22 0420  WBC 6.9 8.1  NEUTROABS 3.7  --   HGB 16.2 15.7  HCT 46.0 47.0  MCV 83.6 86.4  PLT 274 271    Basic Metabolic Panel: Recent Labs  Lab 10/25/22 1442 10/26/22 0420 10/27/22 0106 10/28/22 0325  NA 137 137 138 137  K 4.1 3.5  4.8 3.1*  CL 103 105 103 99  CO2 21* 21* 27 27  GLUCOSE 151* 149* 132* 123*  BUN 29* 26* 32* 41*  CREATININE 1.96* 1.75* 1.87* 2.05*  CALCIUM 8.7* 8.7* 9.2 8.5*  MG  --   --  2.5*  --     GFR: Estimated Creatinine Clearance: 62.7 mL/min (A) (by C-G formula based on SCr of 2.05 mg/dL (H)). Liver Function Tests: Recent Labs  Lab 10/25/22 1442 10/26/22 0420  AST 29 21  ALT 41 36  ALKPHOS 69 65  BILITOT 0.9 1.1  PROT 7.2 6.8  ALBUMIN 3.1* 3.0*     CBG: Recent Labs  Lab 10/27/22 1043  10/27/22 1608 10/27/22 2102 10/28/22 0615 10/28/22 1037  GLUCAP 225* 139* 142* 144* 227*    Recent Results (from the past 240 hour(s))  Respiratory (~20 pathogens) panel by PCR     Status: None   Collection Time: 10/26/22  5:43 PM   Specimen: Nasopharyngeal Swab; Respiratory  Result Value Ref Range Status   Adenovirus NOT DETECTED NOT DETECTED Final   Coronavirus 229E NOT DETECTED NOT DETECTED Final    Comment: (NOTE) The Coronavirus on the Respiratory Panel, DOES NOT test for the novel  Coronavirus (2019 nCoV)    Coronavirus HKU1 NOT DETECTED NOT DETECTED Final   Coronavirus NL63 NOT DETECTED NOT DETECTED Final   Coronavirus OC43 NOT DETECTED NOT DETECTED Final   Metapneumovirus NOT DETECTED NOT DETECTED Final   Rhinovirus / Enterovirus NOT DETECTED NOT DETECTED Final   Influenza A NOT DETECTED NOT DETECTED Final   Influenza B NOT DETECTED NOT DETECTED Final   Parainfluenza Virus 1 NOT DETECTED NOT DETECTED Final   Parainfluenza Virus 2 NOT DETECTED NOT DETECTED Final   Parainfluenza Virus 3 NOT DETECTED NOT DETECTED Final   Parainfluenza Virus 4 NOT DETECTED NOT DETECTED Final   Respiratory Syncytial Virus NOT DETECTED NOT DETECTED Final   Bordetella pertussis NOT DETECTED NOT DETECTED Final   Bordetella Parapertussis NOT DETECTED NOT DETECTED Final   Chlamydophila pneumoniae NOT DETECTED NOT DETECTED Final   Mycoplasma pneumoniae NOT DETECTED NOT DETECTED Final    Comment: Performed at [facility], [address], [place], [place] [address]     Radiology Studies: [place] EKG SITE RITE  Result Date: 10/26/2022 If Site Rite image not attached, placement could not be confirmed due to current cardiac rhythm.    Scheduled Meds:  aspirin  81 mg Oral Daily   atorvastatin  80 mg Oral Daily   Chlorhexidine Gluconate Cloth  6 each Topical Daily   dapagliflozin propanediol  10 mg Oral QAC breakfast   enoxaparin (LOVENOX) injection  60 mg Subcutaneous Q24H   hydrALAZINE   25 mg Oral Q8H   insulin aspart  0-15 Units Subcutaneous TID WC   insulin glargine-yfgn  15 Units Subcutaneous Daily   potassium chloride  40 mEq Oral Once   sodium chloride flush  10-40 mL Intracatheter Q12H   sodium chloride flush  3 mL Intravenous Q12H   Continuous Infusions:  amiodarone 30 mg/hr (10/28/22 1006)   milrinone 0.25 mcg/kg/min (10/28/22 0743)     LOS: 3 days    Brittish Bolinger  [facility]   10/28/2022, 10:49 AM

## 2022-10-28 NOTE — Progress Notes (Addendum)
  Echocardiogram Echocardiogram transesophagael has been performed.  Douglas [name] 10/28/2022, 8:42 AM

## 2022-10-28 NOTE — Progress Notes (Signed)
Pt in bed watching tv willing to receive education. Pt was educated on the importance of daily weights, s/s of weight gain, adhering to meds, low na diet, and importance of exercise + CRPII. Pt received HF book, low na diet materials and CRPII information. Pt is interested in program, will refer to [facility]. Pt would like [facility] prior to d/c if possible.    Douglas [name] 10/28/2022 2:34 PM    [id number]

## 2022-10-28 NOTE — Progress Notes (Addendum)
Advanced Heart Failure Rounding Note  PCP-Cardiologist: None   Subjective:    Echo 10/26/22: EF 20-25%, severely dilated LV, RV severely enlarged with low normal function, PASP 70 mmHg, severe BASE, moderate MR, moderate TR  Started on milrinone yesterday for co-ox 53% with resp distress and poor urine output. Diuresis much improved. Over 4L out. Breathing much better. No CP.  CVP reported as 5   ON IV amio for NSVT  K 3.1 this am and rec'd 40 kcl   Co-ox 64%. Scr 1.8 -> 2.0    Objective:   Weight Range: 121.2 kg Body mass index is 32.54 kg/m.   Vital Signs:   Temp:  [97.6 F (36.4 C)-98.5 F (36.9 C)] 98.3 F (36.8 C) (12/14 0704) Pulse Rate:  [65-99] 74 (12/14 0704) Resp:  [18-20] 20 (12/14 0704) BP: (92-136)/(70-101) 115/93 (12/14 0704) SpO2:  [94 %-96 %] 96 % (12/14 0704) Weight:  [121.2 kg] 121.2 kg (12/14 0207)    Weight change: Filed Weights   10/26/22 1920 10/27/22 0621 10/28/22 0207  Weight: 125.2 kg 123.3 kg 121.2 kg    Intake/Output:   Intake/Output Summary (Last 24 hours) at 10/28/2022 0717 Last data filed at 10/28/2022 0600 Gross per 24 hour  Intake 1561.98 ml  Output 4250 ml  Net -2688.02 ml      Physical Exam    General:  Well appearing. No resp difficulty HEENT: normal Neck: supple. JVP 5 Carotids 2+ bilat; no bruits. No lymphadenopathy or thryomegaly appreciated. Cor: PMI nondisplaced. Regular rate & rhythm. 2/6 MR Lungs: clear Abdomen: obese soft, nontender, nondistended. No hepatosplenomegaly. No bruits or masses. Good bowel sounds. Extremities: no cyanosis, clubbing, rash, tr edema Neuro: alert & orientedx3, cranial nerves grossly intact. moves all 4 extremities w/o difficulty. Affect pleasant  Telemetry   Sinus 70-80s Personally reviewed   Labs    CBC Recent Labs    10/25/22 1442 10/26/22 0420  WBC 6.9 8.1  NEUTROABS 3.7  --   HGB 16.2 15.7  HCT 46.0 47.0  MCV 83.6 86.4  PLT 274 271    Basic Metabolic  Panel Recent Labs    10/27/22 0106 10/28/22 0325  NA 138 137  K 4.8 3.1*  CL 103 99  CO2 27 27  GLUCOSE 132* 123*  BUN 32* 41*  CREATININE 1.87* 2.05*  CALCIUM 9.2 8.5*  MG 2.5*  --     Liver Function Tests Recent Labs    10/25/22 1442 10/26/22 0420  AST 29 21  ALT 41 36  ALKPHOS 69 65  BILITOT 0.9 1.1  PROT 7.2 6.8  ALBUMIN 3.1* 3.0*    No results for input(s): "LIPASE", "AMYLASE" in the last 72 hours. Cardiac Enzymes No results for input(s): "CKTOTAL", "CKMB", "CKMBINDEX", "TROPONINI" in the last 72 hours.  BNP: BNP (last 3 results) Recent Labs    07/22/22 1452 10/18/22 1821 10/25/22 1442  BNP 557.4* 1,494.7* 1,428.2*     ProBNP (last 3 results) No results for input(s): "PROBNP" in the last 8760 hours.   D-Dimer No results for input(s): "DDIMER" in the last 72 hours. Hemoglobin A1C No results for input(s): "HGBA1C" in the last 72 hours. Fasting Lipid Panel No results for input(s): "CHOL", "HDL", "LDLCALC", "TRIG", "CHOLHDL", "LDLDIRECT" in the last 72 hours. Thyroid Function Tests No results for input(s): "TSH", "T4TOTAL", "T3FREE", "THYROIDAB" in the last 72 hours.  Invalid input(s): "FREET3"  Other results:   Imaging    No results found.   Medications:  Scheduled Medications:  [MAR Hold] aspirin  81 mg Oral Daily   [MAR Hold] atorvastatin  80 mg Oral Daily   [MAR Hold] Chlorhexidine Gluconate Cloth  6 each Topical Daily   [MAR Hold] dapagliflozin propanediol  10 mg Oral QAC breakfast   [MAR Hold] enoxaparin (LOVENOX) injection  60 mg Subcutaneous Q24H   [MAR Hold] furosemide  80 mg Intravenous BID   [MAR Hold] hydrALAZINE  25 mg Oral Q8H   [MAR Hold] insulin aspart  0-15 Units Subcutaneous TID WC   [MAR Hold] insulin glargine-yfgn  15 Units Subcutaneous Daily   [MAR Hold] sodium chloride flush  10-40 mL Intracatheter Q12H   [MAR Hold] sodium chloride flush  3 mL Intravenous Q12H    Infusions:  sodium chloride 20 mL/hr at  10/28/22 0322   amiodarone 30 mg/hr (10/27/22 2116)   milrinone 0.25 mcg/kg/min (10/28/22 0622)    PRN Medications: [MAR Hold] acetaminophen **OR** [MAR Hold] acetaminophen, [MAR Hold] albuterol, [MAR Hold] ALPRAZolam, [MAR Hold] guaiFENesin-dextromethorphan, [MAR Hold] melatonin, [MAR Hold] polyethylene glycol, [MAR Hold] sodium chloride flush    Patient Profile   Mr. Bewley is a 46 year old with a history of HTN, hyperlipidemia, smoker, cocaine abuse,NSVT,  NICM, Medtronic ICD, and chronic HFrEF. Last used cocaine about a week ago.  EF has been down for some time.    Admitted recently with A/C HFrEF. Suspect he had residual congestion when discharged.  Readmitted 12/11 with A/C HFrEF.  Assessment/Plan   1. A/C HFrEF  - NICM. Has Medtronic ICD. 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   - Discharged last week and suspect he had residual congestion + increased fluid intake.  -  Admitted with NYHA IV symptoms.  - On milrinone 0.25 Co-ox 53% -> 64% - Off carvedilol with low output - Holding entresto/spiro with creatinine close to 2.  - Continue farxiga - Continue hydral/imdur - Volume status much improved. Continue IV diuresis today.R echeck CVP  - Will start process for Furoscix after discharge.  - Strict I&O, daily weights   2. AKI on CKD Stage IIIb - Creatinine on admit 2. Baseline previously ~ 1.6.  - Cr 1.9 -> 2.1 today. Follow closely with diuresis.   3. Substance Abuse - Last used cocaine about a week ago.  - Discussed cessation.  - not candidate for advanced therapies   4. CAD - LHC 02/2022 nonobstructive CAD - LDL 124  - Increased atorvastatin to 80 mg daily    5. DMII -On SSI   6. HTN  -BPimproved   7. NSVT - Previously on amio but he stopped in April of this year.  I am not sure why he stopped.  - Frequent burst of NSVT noted on device since the beginning of December.  - Frequent NSVT on tele this am. Now on IV amio - Keep K > 4.0 Mg > 2.0  -  Continue current dose of coreg.    8. Obesity  - Body mass index is 32.54 kg/m.   9. MR  - TEE today    Length of Stay: 3  [name], MD  7:24 AM  Advanced Heart Failure Team Pager [phone] (M-F; 7a - 5p)  Please contact [facility] for night-coverage after hours (5p -7a ) and weekends on [email or web address]

## 2022-10-28 NOTE — TOC Initial Note (Signed)
Transition of Care [facility]) - Initial/Assessment Note    Patient Details  Name: Douglas [name] MRN: [id number] Date of Birth: 02-20-1976  Transition of Care [facility]) CM/SW Contact:    Elliot Cousin, RN Phone Number:[phone] 10/28/2022, 2:12 PM  Clinical Narrative:                  HF TOC CM spoke to pt at bedside. States he lives with Celine Ahr but want to secure permanent housing. He works part-time and states apts are very expensive. Pt reports having scale to do daily weights, He drives to his appts.  Gave permission to complete [id number] referral.    Expected Discharge Plan: Home/Self Care Barriers to Discharge: Continued Medical Work up   Patient Goals and CMS Choice Patient states their goals for this hospitalization and ongoing recovery are:: wants to remain independent and work      Expected Discharge Plan and Services Expected Discharge Plan: Home/Self Care   Discharge Planning Services: CM Consult   Living arrangements for the past 2 months: Apartment                                      Prior Living Arrangements/Services Living arrangements for the past 2 months: Apartment Lives with:: Relatives Patient language and need for interpreter reviewed:: Yes Do you feel safe going back to the place where you live?: Yes      Need for Family Participation in Patient Care: No (Comment) Care giver support system in place?: No (comment) Current home services: DME (scale) Criminal Activity/Legal Involvement Pertinent to Current Situation/Hospitalization: No - Comment as needed  Activities of Daily Living      Permission Sought/Granted Permission sought to share information with : Case Manager, Family Supports, PCP Permission granted to share information with : Yes, Verbal Permission Granted  Share Information with NAME: Gaspar Garbe     Permission granted to share info w Relationship: sister  Permission granted to share info w Contact Information:  [phone]  Emotional Assessment Appearance:: Appears stated age Attitude/Demeanor/Rapport: Engaged Affect (typically observed): Accepting Orientation: : Oriented to Self, Oriented to Place, Oriented to  Time, Oriented to Situation   Psych Involvement: No (comment)  Admission diagnosis:  Acute on chronic systolic CHF (congestive heart failure) (HCC) [I50.23] Acute on chronic congestive heart failure, unspecified heart failure type [facility]) [I50.9] Patient Active Problem List   Diagnosis Date Noted   Acute on chronic systolic CHF (congestive heart failure) (HCC) 10/25/2022   Acute renal failure superimposed on stage 2 chronic kidney disease (HCC) 10/18/2022   Non-occlusive coronary artery disease 10/18/2022   Nonspecific syndrome suggestive of viral illness 07/26/2022   Smoker 07/26/2022   Acute embolism and thrombosis of deep vein of lower extremity, unspecified laterality (HCC) 03/31/2022   Seizure (HCC) 03/31/2022   Onychomycosis 01/16/2017   Depression, major, single episode 01/14/2017   Allergic rhinitis 04/28/2015   Chest pain 04/21/2015   Prolonged Q-T interval on ECG 01/31/2015   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 [place] [address] Phone: [phone] Fax: [phone]  [facility] [address] [place] [address] Phone: [phone] Fax:  [phone]     Social Determinants of Health (SDOH) Interventions    Readmission Risk Interventions     No data to display

## 2022-10-28 NOTE — Progress Notes (Signed)
CVP 3. Stop Lasix today. Will continue milrinone today, 0.25 mcg/kg/min .   Robbie Lis, PA-C

## 2022-10-28 NOTE — Anesthesia Postprocedure Evaluation (Signed)
Anesthesia Post Note  Patient: Douglas [name]  Procedure(s) Performed: TRANSESOPHAGEAL ECHOCARDIOGRAM (TEE)     Patient location during evaluation: PACU Anesthesia Type: General Level of consciousness: awake Pain management: pain level controlled Vital Signs Assessment: post-procedure vital signs reviewed and stable Respiratory status: spontaneous breathing Cardiovascular status: stable Postop Assessment: no apparent nausea or vomiting Anesthetic complications: no   No notable events documented.  Last Vitals:  Vitals:   10/28/22 0840 10/28/22 0850  BP: 121/73 (!) 134/91  Pulse: 85 86  Resp: (!) 21 13  Temp:    SpO2: 98% 97%    Last Pain:  Vitals:   10/28/22 0850  TempSrc:   PainSc: 0-No pain                 Obaloluwa Delatte

## 2022-10-29 DIAGNOSIS — I5023 Acute on chronic systolic (congestive) heart failure: Secondary | ICD-10-CM | POA: Diagnosis not present

## 2022-10-29 LAB — BASIC METABOLIC PANEL
Anion gap: 11 (ref 5–15)
BUN: 42 mg/dL — ABNORMAL HIGH (ref 6–20)
CO2: 26 mmol/L (ref 22–32)
Calcium: 8.4 mg/dL — ABNORMAL LOW (ref 8.9–10.3)
Chloride: 99 mmol/L (ref 98–111)
Creatinine, Ser: 1.93 mg/dL — ABNORMAL HIGH (ref 0.61–1.24)
GFR, Estimated: 42 mL/min — ABNORMAL LOW (ref >60)
Glucose, Bld: 115 mg/dL — ABNORMAL HIGH (ref 70–99)
Potassium: 3.7 mmol/L (ref 3.5–5.1)
Sodium: 136 mmol/L (ref 135–145)

## 2022-10-29 LAB — COOXEMETRY PANEL
Carboxyhemoglobin: 1.9 % — ABNORMAL HIGH (ref 0.5–1.5)
Carboxyhemoglobin: 2.3 % — ABNORMAL HIGH (ref 0.5–1.5)
Carboxyhemoglobin: 1.6 % — ABNORMAL HIGH (ref 0.5–1.5)
Methemoglobin: <0.7 % (ref 0.0–1.5)
Methemoglobin: <0.7 % (ref 0.0–1.5)
Methemoglobin: <0.7 % (ref 0.0–1.5)
O2 Saturation: 49.3 %
O2 Saturation: 82.5 %
O2 Saturation: 55.7 %
Total hemoglobin: 16.6 g/dL — ABNORMAL HIGH (ref 12.0–16.0)
Total hemoglobin: 15.4 g/dL (ref 12.0–16.0)
Total hemoglobin: 15.7 g/dL (ref 12.0–16.0)

## 2022-10-29 LAB — GLUCOSE, CAPILLARY
Glucose-Capillary: 129 mg/dL — ABNORMAL HIGH (ref 70–99)
Glucose-Capillary: 152 mg/dL — ABNORMAL HIGH (ref 70–99)
Glucose-Capillary: 113 mg/dL — ABNORMAL HIGH (ref 70–99)
Glucose-Capillary: 217 mg/dL — ABNORMAL HIGH (ref 70–99)

## 2022-10-29 MED ORDER — TORSEMIDE 100 MG PO TABS
100.0000 mg | ORAL_TABLET | Freq: Every morning | ORAL | Status: DC
  Administered 2022-10-29 – 2022-10-31 (×3): 100 mg via ORAL
  Filled 2022-10-29 (×3): qty 1

## 2022-10-29 MED ORDER — POTASSIUM CHLORIDE CRYS ER 20 MEQ PO TBCR
40.0000 meq | EXTENDED_RELEASE_TABLET | Freq: Once | ORAL | Status: AC
  Administered 2022-10-29: 40 meq via ORAL
  Filled 2022-10-29: qty 2

## 2022-10-29 MED ORDER — TORSEMIDE 100 MG PO TABS
50.0000 mg | ORAL_TABLET | Freq: Every evening | ORAL | Status: DC
  Filled 2022-10-29: qty 0.5

## 2022-10-29 MED ORDER — ISOSORBIDE MONONITRATE ER 30 MG PO TB24
30.0000 mg | ORAL_TABLET | Freq: Every day | ORAL | Status: DC
  Administered 2022-10-29 – 2022-11-08 (×11): 30 mg via ORAL
  Filled 2022-10-29 (×11): qty 1

## 2022-10-29 MED ORDER — TORSEMIDE 100 MG PO TABS: 50.0000 mg | ORAL_TABLET | Freq: Every evening | ORAL | Status: DC

## 2022-10-29 MED ORDER — TORSEMIDE 100 MG PO TABS
50.0000 mg | ORAL_TABLET | Freq: Every evening | ORAL | Status: DC
  Administered 2022-10-29: 50 mg via ORAL

## 2022-10-29 NOTE — Progress Notes (Signed)
PROGRESS NOTE    Douglas [name]  IRW:[id number] DOB: Dec 16, 1975 DOA: 10/25/2022 PCP: Claiborne Rigg, NP   Brief Narrative:  46/M with chronic systolic CHF-EF 20%, NICM,ICD, hypertension, hyperlipidemia, obesity, cocaine use,  nonobstructive coronary artery disease presented to the emergency room with shortness of breath for 1-2 days, DC from [facility] on 12/7 off entresto, Dc weight was 272lbs. In ED, creatinine of 1.96,  Troponin -120  BNP -1428.  Chest x-ray showed prominent pulmonary vasculature, weight up to 280lbs. -Admitted, started on diuretics, CHF team following  Assessment & Plan:   Active Problems:   Diabetes mellitus with stage 2 chronic kidney disease (HCC)   Hyperlipidemia   Essential hypertension   VT (ventricular tachycardia) (HCC)   ICD (MDT) in place   Depression, major, single episode   Seizure (HCC)   Acute renal failure superimposed on stage 2 chronic kidney disease (HCC)   Acute on chronic systolic CHF (congestive heart failure) (HCC)  Acute hypoxic respiratory failure secondary to acute on chronic systolic heart failure: NICM -Presented with DoE, orthopnea and weight gain. -last echo 4/23 ECHO w ejection fraction 25-30%, mod reduced RV.  -weight up 8lbs since recent DC Seen by heart failure team.  Placed on IV Lasix.  PICC line placed and started on milrinone. Rising creatinine noted.  IV Lasix was held yesterday but creatinine slightly improved today so he has been started on torsemide. Also noted to be on Farxiga, hydralazine, statin.ve Underwent TEE which showed 20% EF with global hypokinesis.  LA was severely dilated.  No thrombus noted in the left atrium.  Severe MR was noted.  He is on aspirin.  Management per heart failure team.   AKI on CKD 3a/hypokalemia -Baseline creatinine around 1.5-1.6 prior to middle of this year but during recent hospitalization, his baseline was around 1.9, creatinine jumped to over 2 yesterday but down to 1.9 today.  He is  started on torsemide again.  Entresto on hold.   NSVT -has ICD, continue Coreg On amiodarone infusion.   Type 2 diabetes on insulin:  -Blood sugar very well-controlled.  Continue glargine, CBGs are stable   Troponin elevation:  Mild troponin elevation secondary to demand ischemia, CHF -Cardiac cath in April with mild coronary artery disease.   -Continue aspirin, Coreg and Lipitor.    Cocaine use: Counseled.  -denies any use since recent DC  Hyperlipidemia: Continue atorvastatin.  DVT prophylaxis: Lovenox   Code Status: Full Code  Family Communication:  None present at bedside.  Plan of care discussed with patient in length and he/she verbalized understanding and agreed with it.  Status is: Inpatient Remains inpatient appropriate because: He is on IV Amio and milrinone.   Estimated body mass index is 32.88 kg/m as calculated from the following:   Height as of this encounter: 6\' 4"  (1.93 m).   Weight as of this encounter: 122.5 kg.    Nutritional Assessment: Body mass index is 32.88 kg/m. Seen by dietician.  I agree with the assessment and plan as outlined below: Nutrition Status:        . Skin Assessment: I have examined the patient's skin and I agree with the wound assessment as performed by the wound care RN as outlined below:    Consultants:  Cardiology  Procedures:  None  Antimicrobials:  Anti-infectives (From admission, onward)    None         Subjective: Seen and examined.  He says he feels better.  No shortness of breath.  Objective: Vitals:   10/28/22 1945 10/29/22 0019 10/29/22 0400 10/29/22 0721  BP: 121/88 121/76 116/77 120/89  Pulse: 75 81 93 81  Resp: 17 18 18 18   Temp: 98.7 F (37.1 C)  97.9 F (36.6 C) 98.6 F (37 C)  TempSrc: Oral Oral Oral Oral  SpO2: 94% 94% 98% 97%  Weight:   122.5 kg   Height:        Intake/Output Summary (Last 24 hours) at 10/29/2022 0856 Last data filed at 10/29/2022 0809 Gross per 24 hour   Intake 2528.74 ml  Output 2450 ml  Net 78.74 ml   Filed Weights   10/27/22 0621 10/28/22 0207 10/29/22 0400  Weight: 123.3 kg 121.2 kg 122.5 kg    Examination:  General exam: Appears calm and comfortable, obese Respiratory system: Clear to auscultation. Respiratory effort normal. Cardiovascular system: S1 & S2 heard, RRR. No JVD, murmurs, rubs, gallops or clicks.  +1 edema bilateral lower extremity. Gastrointestinal system: Abdomen is nondistended, soft and nontender. No organomegaly or masses felt. Normal bowel sounds heard. Central nervous system: Alert and oriented. No focal neurological deficits. Extremities: Symmetric 5 x 5 power. Skin: No rashes, lesions or ulcers Psychiatry: Judgement and insight appear normal. Mood & affect appropriate.    Data Reviewed: I have personally reviewed following labs and imaging studies  CBC: Recent Labs  Lab 10/25/22 1442 10/26/22 0420 10/28/22 1824  WBC 6.9 8.1 8.9  NEUTROABS 3.7  --   --   HGB 16.2 15.7 15.8  HCT 46.0 47.0 44.8  MCV 83.6 86.4 83.6  PLT 274 271 279   Basic Metabolic Panel: Recent Labs  Lab 10/25/22 1442 10/26/22 0420 10/27/22 0106 10/28/22 0325 10/29/22 0511  NA 137 137 138 137 136  K 4.1 3.5 4.8 3.1* 3.7  CL 103 105 103 99 99  CO2 21* 21* 27 27 26   GLUCOSE 151* 149* 132* 123* 115*  BUN 29* 26* 32* 41* 42*  CREATININE 1.96* 1.75* 1.87* 2.05* 1.93*  CALCIUM 8.7* 8.7* 9.2 8.5* 8.4*  MG  --   --  2.5*  --   --    GFR: Estimated Creatinine Clearance: 66.9 mL/min (A) (by C-G formula based on SCr of 1.93 mg/dL (H)). Liver Function Tests: Recent Labs  Lab 10/25/22 1442 10/26/22 0420  AST 29 21  ALT 41 36  ALKPHOS 69 65  BILITOT 0.9 1.1  PROT 7.2 6.8  ALBUMIN 3.1* 3.0*   No results for input(s): "LIPASE", "AMYLASE" in the last 168 hours. No results for input(s): "AMMONIA" in the last 168 hours. Coagulation Profile: No results for input(s): "INR", "PROTIME" in the last 168 hours. Cardiac  Enzymes: No results for input(s): "CKTOTAL", "CKMB", "CKMBINDEX", "TROPONINI" in the last 168 hours. BNP (last 3 results) No results for input(s): "PROBNP" in the last 8760 hours. HbA1C: No results for input(s): "HGBA1C" in the last 72 hours. CBG: Recent Labs  Lab 10/28/22 0615 10/28/22 1037 10/28/22 1558 10/28/22 2117 10/29/22 0525  GLUCAP 144* 227* 145* 143* 129*   Lipid Profile: No results for input(s): "CHOL", "HDL", "LDLCALC", "TRIG", "CHOLHDL", "LDLDIRECT" in the last 72 hours. Thyroid Function Tests: No results for input(s): "TSH", "T4TOTAL", "FREET4", "T3FREE", "THYROIDAB" in the last 72 hours. Anemia Panel: No results for input(s): "VITAMINB12", "FOLATE", "FERRITIN", "TIBC", "IRON", "RETICCTPCT" in the last 72 hours. Sepsis Labs: Recent Labs  Lab 10/27/22 1446 10/28/22 0355 10/28/22 1824  PROCALCITON  --   --  <0.10  LATICACIDVEN 1.2 0.8  --  Recent Results (from the past 240 hour(s))  Respiratory (~20 pathogens) panel by PCR     Status: None   Collection Time: 10/26/22  5:43 PM   Specimen: Nasopharyngeal Swab; Respiratory  Result Value Ref Range Status   Adenovirus NOT DETECTED NOT DETECTED Final   Coronavirus 229E NOT DETECTED NOT DETECTED Final    Comment: (NOTE) The Coronavirus on the Respiratory Panel, DOES NOT test for the novel  Coronavirus (2019 nCoV)    Coronavirus HKU1 NOT DETECTED NOT DETECTED Final   Coronavirus NL63 NOT DETECTED NOT DETECTED Final   Coronavirus OC43 NOT DETECTED NOT DETECTED Final   Metapneumovirus NOT DETECTED NOT DETECTED Final   Rhinovirus / Enterovirus NOT DETECTED NOT DETECTED Final   Influenza A NOT DETECTED NOT DETECTED Final   Influenza B NOT DETECTED NOT DETECTED Final   Parainfluenza Virus 1 NOT DETECTED NOT DETECTED Final   Parainfluenza Virus 2 NOT DETECTED NOT DETECTED Final   Parainfluenza Virus 3 NOT DETECTED NOT DETECTED Final   Parainfluenza Virus 4 NOT DETECTED NOT DETECTED Final   Respiratory Syncytial  Virus NOT DETECTED NOT DETECTED Final   Bordetella pertussis NOT DETECTED NOT DETECTED Final   Bordetella Parapertussis NOT DETECTED NOT DETECTED Final   Chlamydophila pneumoniae NOT DETECTED NOT DETECTED Final   Mycoplasma pneumoniae NOT DETECTED NOT DETECTED Final    Comment: Performed at [facility], [address] [place], [place] [address]     Radiology Studies: DG CHEST PORT 1 VIEW  Result Date: 10/28/2022 CLINICAL DATA:  Dyspnea EXAM: PORTABLE CHEST 1 VIEW COMPARISON:  10/25/2022 FINDINGS: Left-sided implanted cardiac device remains in place. Stable cardiomegaly. Pulmonary vascular congestion with mildly prominent perihilar and bibasilar interstitial markings. No pleural effusion or pneumothorax. IMPRESSION: Appearance favoring CHF with mild edema, slightly improved from prior. Electronically Signed   By: Duanne Guess D.O.   On: 10/28/2022 18:06    Scheduled Meds:  aspirin  81 mg Oral Daily   atorvastatin  80 mg Oral Daily   Chlorhexidine Gluconate Cloth  6 each Topical Daily   dapagliflozin propanediol  10 mg Oral QAC breakfast   enoxaparin (LOVENOX) injection  60 mg Subcutaneous Q24H   hydrALAZINE  25 mg Oral Q8H   insulin aspart  0-15 Units Subcutaneous TID WC   insulin glargine-yfgn  15 Units Subcutaneous Daily   isosorbide mononitrate  30 mg Oral Daily   sodium chloride flush  10-40 mL Intracatheter Q12H   sodium chloride flush  3 mL Intravenous Q12H   torsemide  100 mg Oral q morning   torsemide  50 mg Oral QPM   Continuous Infusions:  amiodarone 30 mg/hr (10/29/22 0837)   milrinone 0.25 mcg/kg/min (10/29/22 0443)     LOS: 4 days   Hughie Closs, MD [facility]  10/29/2022, 8:56 AM   *Please note that this is a verbal dictation therefore any spelling or grammatical errors are due to the "Dragon Medical One" system interpretation.  Please page via Amion and do not message via secure chat for urgent patient care matters. Secure chat can be used  for non urgent patient care matters.  How to contact the [facility] Attending or Consulting provider 7A - 7P or covering provider during after hours 7P -7A, for this patient?  Check the care team in [facility] and look for a) attending/consulting TRH provider listed and b) the [facility] team listed. Page or secure chat 7A-7P. Log into [email or web address] and use [place]'s universal password to access. If you do  not have the password, please contact the hospital operator. Locate the [facility] provider you are looking for under [facility] and page to a number that you can be directly reached. If you still have difficulty reaching the provider, please page the [facility] (Director on Call) for the Hospitalists listed on amion for assistance.

## 2022-10-29 NOTE — Progress Notes (Signed)
CARDIAC REHAB PHASE I   Pt resting in bed, feeling well today. Reports oob in room and just finished taking shower, tolerating well with minimal sob. Reviewed education provided yesterday, all questions and concerns addressed. Has received pill box for home use. Will continue to follow.   1130-1200  Woodroe Chen, RN BSN 10/29/2022 12:20 PM

## 2022-10-29 NOTE — Progress Notes (Addendum)
Advanced Heart Failure Rounding Note  PCP-Cardiologist: None   Subjective:    12/13: Milrinone added for low-output, IV amio for frequent NSVT  CO-OX 49% on milrinone 0.25  CVP 7-8  No longer having orthopnea or PND but still dyspneic with light activity.   Echo, 10/26/22: EF 20-25%, severely dilated LV, RV severely enlarged with low normal function, PASP 70 mmHg, severe BASE, moderate MR, moderate TR  TEE, 1214/23: EF 20%, severely LAE, severe central MR, moderate TR   Objective:   Weight Range: 122.5 kg Body mass index is 32.88 kg/m.   Vital Signs:   Temp:  [97.7 F (36.5 C)-98.7 F (37.1 C)] 98.6 F (37 C) (12/15 0721) Pulse Rate:  [75-93] 81 (12/15 0721) Resp:  [13-25] 18 (12/15 0721) BP: (111-134)/(73-92) 120/89 (12/15 0721) SpO2:  [94 %-98 %] 97 % (12/15 0721) Weight:  [122.5 kg] 122.5 kg (12/15 0400)    Weight change: Filed Weights   10/27/22 0621 10/28/22 0207 10/29/22 0400  Weight: 123.3 kg 121.2 kg 122.5 kg    Intake/Output:   Intake/Output Summary (Last 24 hours) at 10/29/2022 0756 Last data filed at 10/29/2022 0600 Gross per 24 hour  Intake 1928.74 ml  Output 2450 ml  Net -521.26 ml     Physical Exam    General:  Sitting up on side of bed. No distress. HEENT: normal Neck: supple. JVP 8 cm. Carotids 2+ bilat; no bruits.  Cor: PMI nondisplaced. Regular rate & rhythm. No rubs, gallops, 2/6 MR murmur Lungs: clear Abdomen: obese, soft, nontender, nondistended.  Extremities: no cyanosis, clubbing, rash, 1+ edema Neuro: alert & orientedx3, cranial nerves grossly intact. moves all 4 extremities w/o difficulty. Affect pleasant   Telemetry   Sinus 70s-80s, less frequent runs NSVT (personally reviewed)   Labs    CBC Recent Labs    10/28/22 1824  WBC 8.9  HGB 15.8  HCT 44.8  MCV 83.6  PLT 279   Basic Metabolic Panel Recent Labs    30/86/57 0106 10/28/22 0325 10/29/22 0511  NA 138 137 136  K 4.8 3.1* 3.7  CL 103 99 99   CO2 27 27 26   GLUCOSE 132* 123* 115*  BUN 32* 41* 42*  CREATININE 1.87* 2.05* 1.93*  CALCIUM 9.2 8.5* 8.4*  MG 2.5*  --   --    Liver Function Tests No results for input(s): "AST", "ALT", "ALKPHOS", "BILITOT", "PROT", "ALBUMIN" in the last 72 hours. No results for input(s): "LIPASE", "AMYLASE" in the last 72 hours. Cardiac Enzymes No results for input(s): "CKTOTAL", "CKMB", "CKMBINDEX", "TROPONINI" in the last 72 hours.  BNP: BNP (last 3 results) Recent Labs    10/18/22 1821 10/25/22 1442 10/28/22 1825  BNP 1,494.7* 1,428.2* 491.6*    ProBNP (last 3 results) No results for input(s): "PROBNP" in the last 8760 hours.   D-Dimer No results for input(s): "DDIMER" in the last 72 hours. Hemoglobin A1C No results for input(s): "HGBA1C" in the last 72 hours. Fasting Lipid Panel No results for input(s): "CHOL", "HDL", "LDLCALC", "TRIG", "CHOLHDL", "LDLDIRECT" in the last 72 hours. Thyroid Function Tests No results for input(s): "TSH", "T4TOTAL", "T3FREE", "THYROIDAB" in the last 72 hours.  Invalid input(s): "FREET3"  Other results:   Imaging    DG CHEST PORT 1 VIEW  Result Date: 10/28/2022 CLINICAL DATA:  Dyspnea EXAM: PORTABLE CHEST 1 VIEW COMPARISON:  10/25/2022 FINDINGS: Left-sided implanted cardiac device remains in place. Stable cardiomegaly. Pulmonary vascular congestion with mildly prominent perihilar and bibasilar interstitial markings. No pleural  effusion or pneumothorax. IMPRESSION: Appearance favoring CHF with mild edema, slightly improved from prior. Electronically Signed   By: Duanne Guess D.O.   On: 10/28/2022 18:06     Medications:     Scheduled Medications:  aspirin  81 mg Oral Daily   atorvastatin  80 mg Oral Daily   Chlorhexidine Gluconate Cloth  6 each Topical Daily   dapagliflozin propanediol  10 mg Oral QAC breakfast   enoxaparin (LOVENOX) injection  60 mg Subcutaneous Q24H   hydrALAZINE  25 mg Oral Q8H   insulin aspart  0-15 Units  Subcutaneous TID WC   insulin glargine-yfgn  15 Units Subcutaneous Daily   sodium chloride flush  10-40 mL Intracatheter Q12H   sodium chloride flush  3 mL Intravenous Q12H    Infusions:  amiodarone 30 mg/hr (10/28/22 2119)   milrinone 0.25 mcg/kg/min (10/29/22 0443)    PRN Medications: acetaminophen **OR** acetaminophen, albuterol, ALPRAZolam, guaiFENesin-dextromethorphan, melatonin, polyethylene glycol, sodium chloride flush    Patient Profile   Douglas [name] is a 46 year old with a history of HTN, hyperlipidemia, smoker, cocaine abuse,NSVT,  NICM, Medtronic ICD, and chronic HFrEF. Last used cocaine about a week ago.  EF has been down for some time.    Admitted recently with A/C HFrEF. Suspect he had residual congestion when discharged.  Readmitted 12/11 with A/C HFrEF.  Assessment/Plan   1. A/C HFrEF  - NICM. Has Medtronic ICD. 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   - Discharged last week and suspect he had residual congestion + increased fluid intake.  -  Admitted with NYHA IV symptoms.  - On milrinone 0.25 CO-OX 49% >>>recheck. Anticipate slow milrinone wean. - CVP 7-8. Restart home Torsemide 100 mg q am/50 mg q pm - Off carvedilol with low output - Holding entresto/spiro with creatinine close to 2.  - Continue farxiga - Continue hydral. Add imdur. - Will start process for Furoscix after discharge.    2. AKI on CKD Stage IIIb - Creatinine on admit 2. Baseline previously ~ 1.6.  - Cr stable 1.9 today. Follow closely with diuresis.   3. Substance Abuse - Last used cocaine about a week ago.  - Discussed cessation.  - not candidate for advanced therapies   4. CAD - LHC 02/2022 nonobstructive CAD - LDL 124  - Increased atorvastatin to 80 mg daily    5. DMII -On SSI   6. HTN  - BP improved   7. NSVT - Previously on amio but he stopped in April of this year.  I am not sure why he stopped.  - Frequent burst of NSVT noted on device since the beginning  of December.  - Now on IV amio d/t frequent runs NSVT.  - Continue IV amio while on inotrope support - Keep K > 4.0 Mg > 2.0  - Off Coreg with low-output   8. Obesity  - Body mass index is 32.88 kg/m.   9. MR  - TEE 12/14: EF 20%, Severe central MR   Length of Stay: 4  FINCH, LINDSAY N, PA-C  7:56 AM  Advanced Heart Failure Team Pager [phone] (M-F; 7a - 5p)  Please contact [facility] for night-coverage after hours (5p -7a ) and weekends on [email or web address]  Patient seen and examined with the above-signed Advanced Practice Provider and/or Housestaff. I personally reviewed laboratory data, imaging studies and relevant notes. I independently examined the patient and formulated the important aspects of the plan. I have edited the note to  reflect any of my changes or salient points. I have personally discussed the plan with the patient and/or family.  Remains on milrinone. Co-ox low. CVP 7-8 Feels ok. No SOB, orthopnea or PND  TEE yesterday with EF 25% severe MR RV moderately down   General:  Lying in bed No resp difficulty HEENT: normal Neck: supple. JVP 7 Carotids 2+ bilat; no bruits. No lymphadenopathy or thryomegaly appreciated. Cor: PMI nondisplaced. Regular rate & rhythm. 2/6 MR Lungs: clear Abdomen: obese soft, nontender, nondistended. No hepatosplenomegaly. No bruits or masses. Good bowel sounds. Extremities: no cyanosis, clubbing, rash, edema Neuro: alert & orientedx3, cranial nerves grossly intact. moves all 4 extremities w/o difficulty. Affect pleasant  I suspect he is nearing end-stage HF. Ideally would look toward advanced therapies but substance abuse and non-compliance complicates issues.   Will try to wean milrinone slowly. Restart diuretics. mTEER may be an option but he may be too far along in his course. (MitraFR patient-type)   [name], MD  8:31 AM

## 2022-10-30 DIAGNOSIS — I5023 Acute on chronic systolic (congestive) heart failure: Secondary | ICD-10-CM | POA: Diagnosis not present

## 2022-10-30 LAB — GLUCOSE, CAPILLARY
Glucose-Capillary: 119 mg/dL — ABNORMAL HIGH (ref 70–99)
Glucose-Capillary: 142 mg/dL — ABNORMAL HIGH (ref 70–99)
Glucose-Capillary: 156 mg/dL — ABNORMAL HIGH (ref 70–99)
Glucose-Capillary: 144 mg/dL — ABNORMAL HIGH (ref 70–99)

## 2022-10-30 LAB — COOXEMETRY PANEL
Carboxyhemoglobin: 2.2 % — ABNORMAL HIGH (ref 0.5–1.5)
Methemoglobin: 0.8 % (ref 0.0–1.5)
O2 Saturation: 67.4 %
Total hemoglobin: 15.3 g/dL (ref 12.0–16.0)

## 2022-10-30 LAB — BASIC METABOLIC PANEL
Anion gap: 9 (ref 5–15)
BUN: 37 mg/dL — ABNORMAL HIGH (ref 6–20)
CO2: 29 mmol/L (ref 22–32)
Calcium: 8.4 mg/dL — ABNORMAL LOW (ref 8.9–10.3)
Chloride: 99 mmol/L (ref 98–111)
Creatinine, Ser: 1.68 mg/dL — ABNORMAL HIGH (ref 0.61–1.24)
GFR, Estimated: 50 mL/min — ABNORMAL LOW (ref >60)
Glucose, Bld: 114 mg/dL — ABNORMAL HIGH (ref 70–99)
Potassium: 3.4 mmol/L — ABNORMAL LOW (ref 3.5–5.1)
Sodium: 137 mmol/L (ref 135–145)

## 2022-10-30 LAB — MAGNESIUM: Magnesium: 2 mg/dL (ref 1.7–2.4)

## 2022-10-30 MED ORDER — POTASSIUM CHLORIDE CRYS ER 20 MEQ PO TBCR
40.0000 meq | EXTENDED_RELEASE_TABLET | Freq: Once | ORAL | Status: AC
  Administered 2022-10-30: 40 meq via ORAL
  Filled 2022-10-30: qty 2

## 2022-10-30 NOTE — Progress Notes (Signed)
Came to offer assist with amb but pt sleeping.

## 2022-10-30 NOTE — Progress Notes (Signed)
Advanced Heart Failure Rounding Note  PCP-Cardiologist: None   Subjective:    12/13: Milrinone added for low-output, IV amio for frequent NSVT  - Mixed venous of 67 on milrinone 0.29mcg/kg/min. CVP 4. Laying flat in bed, NAD.    Objective:   Weight Range: 120.5 kg Body mass index is 32.33 kg/m.   Vital Signs:   Temp:  [98 F (36.7 C)-98.7 F (37.1 C)] 98.7 F (37.1 C) (12/16 0521) Pulse Rate:  [77-81] 77 (12/16 0521) Resp:  [18] 18 (12/16 0521) BP: (112-114)/(78-90) 112/90 (12/16 0521) SpO2:  [93 %-97 %] 93 % (12/16 0521) Weight:  [120.5 kg] 120.5 kg (12/16 0521) Last BM Date : 10/29/22  Weight change: Filed Weights   10/28/22 0207 10/29/22 0400 10/30/22 0521  Weight: 121.2 kg 122.5 kg 120.5 kg    Intake/Output:   Intake/Output Summary (Last 24 hours) at 10/30/2022 1315 Last data filed at 10/30/2022 1206 Gross per 24 hour  Intake 910 ml  Output 4775 ml  Net -3865 ml      Physical Exam    General:  laying flat in bed, NAD HEENT: normal Neck: supple. CVP 4 cm. Carotids 2+ bilat; no bruits.  Cor: PMI nondisplaced. Regular rate & rhythm. No rubs, gallops, 2/6 MR murmur Lungs: clear Abdomen: obese, soft, nontender, nondistended.  Extremities: no cyanosis, clubbing, rash, 1+ edema Neuro: alert & orientedx3, cranial nerves grossly intact. moves all 4 extremities w/o difficulty. Affect pleasant   Telemetry   Sinus 70s-80s, less frequent runs NSVT (personally reviewed)   Labs    CBC Recent Labs    10/28/22 1824  WBC 8.9  HGB 15.8  HCT 44.8  MCV 83.6  PLT 279    Basic Metabolic Panel Recent Labs    69/48/54 0511 10/30/22 0428  NA 136 137  K 3.7 3.4*  CL 99 99  CO2 26 29  GLUCOSE 115* 114*  BUN 42* 37*  CREATININE 1.93* 1.68*  CALCIUM 8.4* 8.4*  MG  --  2.0    Liver Function Tests No results for input(s): "AST", "ALT", "ALKPHOS", "BILITOT", "PROT", "ALBUMIN" in the last 72 hours. No results for input(s): "LIPASE", "AMYLASE" in  the last 72 hours. Cardiac Enzymes No results for input(s): "CKTOTAL", "CKMB", "CKMBINDEX", "TROPONINI" in the last 72 hours.  BNP: BNP (last 3 results) Recent Labs    10/18/22 1821 10/25/22 1442 10/28/22 1825  BNP 1,494.7* 1,428.2* 491.6*     ProBNP (last 3 results) No results for input(s): "PROBNP" in the last 8760 hours.   D-Dimer No results for input(s): "DDIMER" in the last 72 hours. Hemoglobin A1C No results for input(s): "HGBA1C" in the last 72 hours. Fasting Lipid Panel No results for input(s): "CHOL", "HDL", "LDLCALC", "TRIG", "CHOLHDL", "LDLDIRECT" in the last 72 hours. Thyroid Function Tests No results for input(s): "TSH", "T4TOTAL", "T3FREE", "THYROIDAB" in the last 72 hours.  Invalid input(s): "FREET3"  Other results:   Imaging    No results found.   Medications:     Scheduled Medications:  aspirin  81 mg Oral Daily   atorvastatin  80 mg Oral Daily   Chlorhexidine Gluconate Cloth  6 each Topical Daily   dapagliflozin propanediol  10 mg Oral QAC breakfast   enoxaparin (LOVENOX) injection  60 mg Subcutaneous Q24H   hydrALAZINE  25 mg Oral Q8H   insulin aspart  0-15 Units Subcutaneous TID WC   insulin glargine-yfgn  15 Units Subcutaneous Daily   isosorbide mononitrate  30 mg Oral Daily  sodium chloride flush  10-40 mL Intracatheter Q12H   sodium chloride flush  3 mL Intravenous Q12H   torsemide  100 mg Oral q morning    Infusions:  amiodarone 30 mg/hr (10/30/22 [id number])   milrinone 0.25 mcg/kg/min (10/29/22 1619)    PRN Medications: acetaminophen **OR** acetaminophen, albuterol, ALPRAZolam, guaiFENesin-dextromethorphan, melatonin, polyethylene glycol, sodium chloride flush    Patient Profile   Douglas [name] is a 46 year old with a history of HTN, hyperlipidemia, smoker, cocaine abuse,NSVT,  NICM, Medtronic ICD, and chronic HFrEF. Last used cocaine about a week ago.  EF has been down for some time.    Admitted recently with A/C HFrEF.  Suspect he had residual congestion when discharged.  Readmitted 12/11 with A/C HFrEF.  Assessment/Plan   1. A/C HFrEF  - NICM. Has Medtronic ICD. Had [place] earlier this year with nonobstructive CAD.   - Discharged last week and suspect he had residual congestion + increased fluid intake.  -  Admitted with NYHA IV symptoms.  - Off carvedilol with low output - Holding entresto/spiro with creatinine close to 2.  - Continue farxiga - Continue hydral. Add imdur. - Will start process for Furoscix after discharge.  - This AM, sCr improved to 1.68, CVP 4 after 4.8 L urine output yesterday. Will decrease milrinone to 0.119mcg/kg/min and hold off on PM torsemide dose today. Plan for 100mg  torsemide tomorrow AM.    2. AKI on CKD Stage IIIb - Creatinine on admit 2. Baseline previously ~ 1.6.  - Cr stable 1.68 today. Follow closely with diuresis.   3. Substance Abuse - Last used cocaine about a week ago.  - Discussed cessation.  - not candidate for advanced therapies   4. CAD - LHC 02/2022 nonobstructive CAD - LDL 124  - Increased atorvastatin to 80 mg daily    5. DMII -On SSI   6. HTN  - BP improved   7. NSVT - Previously on amio but he stopped in April of this year.  I am not sure why he stopped.  - Frequent burst of NSVT noted on device since the beginning of December.  - Now on IV amio d/t frequent runs NSVT.  - Continue IV amio while on inotrope support - Keep K > 4.0 Mg > 2.0  - Off Coreg with low-output   8. Obesity  - Body mass index is 32.33 kg/m.   9. 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

## 2022-10-30 NOTE — Progress Notes (Signed)
PROGRESS NOTE    Douglas [name]  OEV:[id number] DOB: 10/24/1976 DOA: 10/25/2022 PCP: Claiborne Rigg, NP   Brief Narrative:  46/M with chronic systolic CHF-EF 20%, NICM,ICD, hypertension, hyperlipidemia, obesity, cocaine use,  nonobstructive coronary artery disease presented to the emergency room with shortness of breath for 1-2 days, [facility] on [facility]/7 off entresto, Dc weight was 272lbs. In ED, creatinine of 1.96,  Troponin -120  BNP -1428.  Chest x-ray showed prominent pulmonary vasculature, weight up to 280lbs. -Admitted, started on diuretics, CHF team following  Assessment & Plan:   Active Problems:   Diabetes mellitus with stage 2 chronic kidney disease (HCC)   Hyperlipidemia   Essential hypertension   VT (ventricular tachycardia) (HCC)   ICD (MDT) in place   Depression, major, single episode   Seizure (HCC)   Acute renal failure superimposed on stage 2 chronic kidney disease (HCC)   Acute on chronic systolic CHF (congestive heart failure) (HCC)  Acute hypoxic respiratory failure secondary to acute on chronic systolic heart failure: NICM -Presented with DoE, orthopnea and weight gain. -last echo 4/23 ECHO w ejection fraction 25-30%, mod reduced RV.  -weight up 8lbs since recent DC Seen by heart failure team.  Placed on IV Lasix.  PICC line placed and started on milrinone. Rising creatinine noted.  IV Lasix was held daily for yesterday but creatinine started to improve so he was started on torsemide on 10/29/2022.  Also noted to be on Farxiga, hydralazine, statin. Underwent TEE which showed 20% EF with global hypokinesis.  LA was severely dilated.  No thrombus noted in the left atrium.  Severe MR was noted.  He is on aspirin.  Management per heart failure team.   AKI on CKD 3a/hypokalemia -Baseline creatinine around 1.5-1.6 prior to middle of this year but during recent hospitalization, his baseline was around 1.9, creatinine jumped to over 2 day before yesterday but  has not started improving and down to 1.7 today.     NSVT -has ICD, continue Coreg On amiodarone infusion.   Type 2 diabetes on insulin:  -Blood sugar very well-controlled.  Continue glargine, CBGs are stable   Troponin elevation:  Mild troponin elevation secondary to demand ischemia, CHF -Cardiac cath in April with mild coronary artery disease.   -Continue aspirin, Coreg and Lipitor.    Cocaine use: Counseled.  -denies any use since recent DC  Hyperlipidemia: Continue atorvastatin.  DVT prophylaxis: Lovenox   Code Status: Full Code  Family Communication:  None present at bedside.  Plan of care discussed with patient in length and he/she verbalized understanding and agreed with it.  Status is: Inpatient Remains inpatient appropriate because: He is on IV Amio and milrinone.   Estimated body mass index is 32.33 kg/m as calculated from the following:   Height as of this encounter: 6\' 4"  (1.93 m).   Weight as of this encounter: 120.5 kg.    Nutritional Assessment: Body mass index is 32.33 kg/m. Seen by dietician.  I agree with the assessment and plan as outlined below: Nutrition Status:        . Skin Assessment: I have examined the patient's skin and I agree with the wound assessment as performed by the wound care RN as outlined below:    Consultants:  Cardiology  Procedures:  None  Antimicrobials:  Anti-infectives (From admission, onward)    None         Subjective: Patient seen and examined.  He has no complaints.  No shortness of  breath.  Objective: Vitals:   10/29/22 1055 10/29/22 1614 10/29/22 2002 10/30/22 0521  BP: 136/82 114/78 112/84 (!) 112/90  Pulse: 99 81 79 77  Resp: 18 18 18 18   Temp: 97.8 F (36.6 C) 98 F (36.7 C) 98.4 F (36.9 C) 98.7 F (37.1 C)  TempSrc: Oral Oral Oral Oral  SpO2: 98% 96% 97% 93%  Weight:    120.5 kg  Height:        Intake/Output Summary (Last 24 hours) at 10/30/2022 1028 Last data filed at 10/30/2022  0839 Gross per 24 hour  Intake 1150 ml  Output 4800 ml  Net -3650 ml    Filed Weights   10/28/22 0207 10/29/22 0400 10/30/22 0521  Weight: 121.2 kg 122.5 kg 120.5 kg    Examination:  General exam: Appears calm and comfortable  Respiratory system: Clear to auscultation. Respiratory effort normal. Cardiovascular system: S1 & S2 heard, RRR. No JVD, murmurs, rubs, gallops or clicks.  +1 pitting edema bilateral lower extremity. Gastrointestinal system: Abdomen is nondistended, soft and nontender. No organomegaly or masses felt. Normal bowel sounds heard. Central nervous system: Alert and oriented. No focal neurological deficits. Extremities: Symmetric 5 x 5 power. Skin: No rashes, lesions or ulcers.  Psychiatry: Judgement and insight appear normal. Mood & affect appropriate.     Data Reviewed: I have personally reviewed following labs and imaging studies  CBC: Recent Labs  Lab 10/25/22 1442 10/26/22 0420 10/28/22 1824  WBC 6.9 8.1 8.9  NEUTROABS 3.7  --   --   HGB 16.2 15.7 15.8  HCT 46.0 47.0 44.8  MCV 83.6 86.4 83.6  PLT 274 271 279    Basic Metabolic Panel: Recent Labs  Lab 10/26/22 0420 10/27/22 0106 10/28/22 0325 10/29/22 0511 10/30/22 0428  NA 137 138 137 136 137  K 3.5 4.8 3.1* 3.7 3.4*  CL 105 103 99 99 99  CO2 21* 27 27 26 29   GLUCOSE 149* 132* 123* 115* 114*  BUN 26* 32* 41* 42* 37*  CREATININE 1.75* 1.87* 2.05* 1.93* 1.68*  CALCIUM 8.7* 9.2 8.5* 8.4* 8.4*  MG  --  2.5*  --   --  2.0    GFR: Estimated Creatinine Clearance: 76.3 mL/min (A) (by C-G formula based on SCr of 1.68 mg/dL (H)). Liver Function Tests: Recent Labs  Lab 10/25/22 1442 10/26/22 0420  AST 29 21  ALT 41 36  ALKPHOS 69 65  BILITOT 0.9 1.1  PROT 7.2 6.8  ALBUMIN 3.1* 3.0*    No results for input(s): "LIPASE", "AMYLASE" in the last 168 hours. No results for input(s): "AMMONIA" in the last 168 hours. Coagulation Profile: No results for input(s): "INR", "PROTIME" in the  last 168 hours. Cardiac Enzymes: No results for input(s): "CKTOTAL", "CKMB", "CKMBINDEX", "TROPONINI" in the last 168 hours. BNP (last 3 results) No results for input(s): "PROBNP" in the last 8760 hours. HbA1C: No results for input(s): "HGBA1C" in the last 72 hours. CBG: Recent Labs  Lab 10/29/22 0525 10/29/22 1054 10/29/22 1559 10/29/22 2134 10/30/22 0641  GLUCAP 129* 152* 113* 217* 119*    Lipid Profile: No results for input(s): "CHOL", "HDL", "LDLCALC", "TRIG", "CHOLHDL", "LDLDIRECT" in the last 72 hours. Thyroid Function Tests: No results for input(s): "TSH", "T4TOTAL", "FREET4", "T3FREE", "THYROIDAB" in the last 72 hours. Anemia Panel: No results for input(s): "VITAMINB12", "FOLATE", "FERRITIN", "TIBC", "IRON", "RETICCTPCT" in the last 72 hours. Sepsis Labs: Recent Labs  Lab 10/27/22 1446 10/28/22 0355 10/28/22 1824  PROCALCITON  --   --  <  0.10  LATICACIDVEN 1.2 0.8  --      Recent Results (from the past 240 hour(s))  Respiratory (~20 pathogens) panel by PCR     Status: None   Collection Time: 10/26/22  5:43 PM   Specimen: Nasopharyngeal Swab; Respiratory  Result Value Ref Range Status   Adenovirus NOT DETECTED NOT DETECTED Final   Coronavirus 229E NOT DETECTED NOT DETECTED Final    Comment: (NOTE) The Coronavirus on the Respiratory Panel, DOES NOT test for the novel  Coronavirus (2019 nCoV)    Coronavirus HKU1 NOT DETECTED NOT DETECTED Final   Coronavirus NL63 NOT DETECTED NOT DETECTED Final   Coronavirus OC43 NOT DETECTED NOT DETECTED Final   Metapneumovirus NOT DETECTED NOT DETECTED Final   Rhinovirus / Enterovirus NOT DETECTED NOT DETECTED Final   Influenza A NOT DETECTED NOT DETECTED Final   Influenza B NOT DETECTED NOT DETECTED Final   Parainfluenza Virus 1 NOT DETECTED NOT DETECTED Final   Parainfluenza Virus 2 NOT DETECTED NOT DETECTED Final   Parainfluenza Virus 3 NOT DETECTED NOT DETECTED Final   Parainfluenza Virus 4 NOT DETECTED NOT DETECTED  Final   Respiratory Syncytial Virus NOT DETECTED NOT DETECTED Final   Bordetella pertussis NOT DETECTED NOT DETECTED Final   Bordetella Parapertussis NOT DETECTED NOT DETECTED Final   Chlamydophila pneumoniae NOT DETECTED NOT DETECTED Final   Mycoplasma pneumoniae NOT DETECTED NOT DETECTED Final    Comment: Performed at [facility], [address], [place], [place] [address]     Radiology Studies: DG CHEST PORT 1 VIEW  Result Date: 10/28/2022 CLINICAL DATA:  Dyspnea EXAM: PORTABLE CHEST 1 VIEW COMPARISON:  10/25/2022 FINDINGS: Left-sided implanted cardiac device remains in place. Stable cardiomegaly. Pulmonary vascular congestion with mildly prominent perihilar and bibasilar interstitial markings. No pleural effusion or pneumothorax. IMPRESSION: Appearance favoring CHF with mild edema, slightly improved from prior. Electronically Signed   By: Duanne Guess D.O.   On: 10/28/2022 18:06    Scheduled Meds:  aspirin  81 mg Oral Daily   atorvastatin  80 mg Oral Daily   Chlorhexidine Gluconate Cloth  6 each Topical Daily   dapagliflozin propanediol  10 mg Oral QAC breakfast   enoxaparin (LOVENOX) injection  60 mg Subcutaneous Q24H   hydrALAZINE  25 mg Oral Q8H   insulin aspart  0-15 Units Subcutaneous TID WC   insulin glargine-yfgn  15 Units Subcutaneous Daily   isosorbide mononitrate  30 mg Oral Daily   sodium chloride flush  10-40 mL Intracatheter Q12H   sodium chloride flush  3 mL Intravenous Q12H   torsemide  100 mg Oral q morning   torsemide  50 mg Oral QPM   Continuous Infusions:  amiodarone 30 mg/hr (10/30/22 6712)   milrinone 0.25 mcg/kg/min (10/29/22 1619)     LOS: 5 days   Hughie Closs, MD [facility]  10/30/2022, 10:28 AM   *Please note that this is a verbal dictation therefore any spelling or grammatical errors are due to the "Dragon Medical One" system interpretation.  Please page via Amion and do not message via secure chat for urgent patient care  matters. Secure chat can be used for non urgent patient care matters.  How to contact the [facility] Attending or Consulting provider 7A - 7P or covering provider during after hours 7P -7A, for this patient?  Check the care team in [facility] and look for a) attending/consulting [facility] provider listed and b) the [facility] team listed. Page or secure chat 7A-7P. Log into  [email or web address] and use [place]'s universal password to access. If you do not have the password, please contact the hospital operator. Locate the [facility] provider you are looking for under [facility] and page to a number that you can be directly reached. If you still have difficulty reaching the provider, please page the [facility] (Director on Call) for the Hospitalists listed on amion for assistance.

## 2022-10-30 NOTE — Progress Notes (Signed)
Initial Nutrition Assessment  DOCUMENTATION CODES:   Obesity unspecified  INTERVENTION:  - Continue current diet order.   NUTRITION DIAGNOSIS:  No nutrition diagnosis at this time.   GOAL:   Patient will meet greater than or equal to 90% of their needs  MONITOR:   PO intake  REASON FOR ASSESSMENT:   Malnutrition Screening Tool    ASSESSMENT:   46 y.o. male admits related to SOB. PMH includes: HLD, HTN, DM, CKD stage 2, CHF. Pt is currently receiving medical management related to acute on chronic systolic CHF.  Meds reviewed: lipitor, sliding scale insulin, semglee (15 units). Labs reviewed: K low.   RD working remotely. Per record, pt has eaten 100% of his meals since admission. Pt is meeting his needs at this time. RD will continue to monitor PO intakes.   Cardiac Rehab is following and has provided pt with materials regarding low sodium diet.   NUTRITION - FOCUSED PHYSICAL EXAM:  Unable to assess, attempt at f/u.   Diet Order:   Diet Order             Diet Heart Room service appropriate? Yes; Fluid consistency: Thin  Diet effective now                   EDUCATION NEEDS:   Education needs have been addressed  Skin:  Skin Assessment: Reviewed RN Assessment  Last BM:  10/29/22  Height:   Ht Readings from Last 1 Encounters:  10/26/22 6\' 4"  (1.93 m)    Weight:   Wt Readings from Last 1 Encounters:  10/30/22 120.5 kg    Ideal Body Weight:     BMI:  Body mass index is 32.33 kg/m.  Estimated Nutritional Needs:   Kcal:  1805-2170 kcals  Protein:  90-105 gm  Fluid:  >/= 1.8 L  11/01/22, RD, LDN, CNSC.

## 2022-10-31 ENCOUNTER — Encounter (HOSPITAL_COMMUNITY): Payer: Self-pay | Admitting: Internal Medicine

## 2022-10-31 DIAGNOSIS — F321 Major depressive disorder, single episode, moderate: Secondary | ICD-10-CM

## 2022-10-31 DIAGNOSIS — I10 Essential (primary) hypertension: Secondary | ICD-10-CM | POA: Diagnosis not present

## 2022-10-31 DIAGNOSIS — I340 Nonrheumatic mitral (valve) insufficiency: Secondary | ICD-10-CM | POA: Insufficient documentation

## 2022-10-31 DIAGNOSIS — F419 Anxiety disorder, unspecified: Secondary | ICD-10-CM | POA: Insufficient documentation

## 2022-10-31 DIAGNOSIS — I5023 Acute on chronic systolic (congestive) heart failure: Secondary | ICD-10-CM | POA: Diagnosis not present

## 2022-10-31 DIAGNOSIS — I4729 Other ventricular tachycardia: Secondary | ICD-10-CM | POA: Insufficient documentation

## 2022-10-31 DIAGNOSIS — N179 Acute kidney failure, unspecified: Secondary | ICD-10-CM | POA: Diagnosis not present

## 2022-10-31 LAB — BASIC METABOLIC PANEL
Anion gap: 9 (ref 5–15)
BUN: 36 mg/dL — ABNORMAL HIGH (ref 6–20)
CO2: 27 mmol/L (ref 22–32)
Calcium: 8.5 mg/dL — ABNORMAL LOW (ref 8.9–10.3)
Chloride: 100 mmol/L (ref 98–111)
Creatinine, Ser: 2.04 mg/dL — ABNORMAL HIGH (ref 0.61–1.24)
GFR, Estimated: 39 mL/min — ABNORMAL LOW (ref >60)
Glucose, Bld: 129 mg/dL — ABNORMAL HIGH (ref 70–99)
Potassium: 3.7 mmol/L (ref 3.5–5.1)
Sodium: 136 mmol/L (ref 135–145)

## 2022-10-31 LAB — COOXEMETRY PANEL
Carboxyhemoglobin: 1.8 % — ABNORMAL HIGH (ref 0.5–1.5)
Methemoglobin: <0.7 % (ref 0.0–1.5)
O2 Saturation: 60 %
Total hemoglobin: 15.2 g/dL (ref 12.0–16.0)

## 2022-10-31 LAB — GLUCOSE, CAPILLARY
Glucose-Capillary: 151 mg/dL — ABNORMAL HIGH (ref 70–99)
Glucose-Capillary: 132 mg/dL — ABNORMAL HIGH (ref 70–99)
Glucose-Capillary: 129 mg/dL — ABNORMAL HIGH (ref 70–99)
Glucose-Capillary: 184 mg/dL — ABNORMAL HIGH (ref 70–99)

## 2022-10-31 MED ORDER — POTASSIUM CHLORIDE CRYS ER 20 MEQ PO TBCR
40.0000 meq | EXTENDED_RELEASE_TABLET | Freq: Every day | ORAL | Status: DC
  Administered 2022-10-31: 40 meq via ORAL
  Filled 2022-10-31: qty 2

## 2022-10-31 NOTE — Progress Notes (Signed)
Advanced Heart Failure Rounding Note  PCP-Cardiologist: None   Subjective:    12/13: Milrinone added for low-output, IV amio for frequent NSVT  - Euvolemic on exam; peripheral edema has resolved. CVP 6 on milrinone 0.151mcg/kg/min. Only complaints at this time are difficulty sleeping.    Objective:   Weight Range: 123.1 kg Body mass index is 33.02 kg/m.   Vital Signs:   Temp:  [98.6 F (37 C)] 98.6 F (37 C) (12/17 0505) Pulse Rate:  [74-91] 83 (12/17 0815) Resp:  [18-20] 18 (12/17 0815) BP: (112-137)/(64-95) 137/95 (12/17 0815) SpO2:  [93 %-100 %] 93 % (12/17 0505) Weight:  [123.1 kg] 123.1 kg (12/17 0505) Last BM Date : 10/31/22  Weight change: Filed Weights   10/29/22 0400 10/30/22 0521 10/31/22 0505  Weight: 122.5 kg 120.5 kg 123.1 kg    Intake/Output:   Intake/Output Summary (Last 24 hours) at 10/31/2022 1247 Last data filed at 10/31/2022 0820 Gross per 24 hour  Intake 1986.7 ml  Output 1900 ml  Net 86.7 ml      Physical Exam    General:  laying flat in bed, NAD HEENT: normal Neck: supple. CVP 6 cm. Carotids 2+ bilat; no bruits.  Cor: PMI nondisplaced. Regular rate & rhythm. No rubs, gallops, 2/6 MR murmur Lungs: clear Abdomen: obese, soft, nontender, nondistended.  Extremities: no cyanosis, clubbing, rash, no edema Neuro: alert & orientedx3, cranial nerves grossly intact. moves all 4 extremities w/o difficulty. Affect pleasant   Telemetry   Sinus 70s-80s, less frequent runs NSVT (personally reviewed)   Labs    CBC Recent Labs    10/28/22 1824  WBC 8.9  HGB 15.8  HCT 44.8  MCV 83.6  PLT 279    Basic Metabolic Panel Recent Labs    62/37/62 0428 10/31/22 0655  NA 137 136  K 3.4* 3.7  CL 99 100  CO2 29 27  GLUCOSE 114* 129*  BUN 37* 36*  CREATININE 1.68* 2.04*  CALCIUM 8.4* 8.5*  MG 2.0  --     Liver Function Tests No results for input(s): "AST", "ALT", "ALKPHOS", "BILITOT", "PROT", "ALBUMIN" in the last 72 hours. No  results for input(s): "LIPASE", "AMYLASE" in the last 72 hours. Cardiac Enzymes No results for input(s): "CKTOTAL", "CKMB", "CKMBINDEX", "TROPONINI" in the last 72 hours.  BNP: BNP (last 3 results) Recent Labs    10/18/22 1821 10/25/22 1442 10/28/22 1825  BNP 1,494.7* 1,428.2* 491.6*     ProBNP (last 3 results) No results for input(s): "PROBNP" in the last 8760 hours.   D-Dimer No results for input(s): "DDIMER" in the last 72 hours. Hemoglobin A1C No results for input(s): "HGBA1C" in the last 72 hours. Fasting Lipid Panel No results for input(s): "CHOL", "HDL", "LDLCALC", "TRIG", "CHOLHDL", "LDLDIRECT" in the last 72 hours. Thyroid Function Tests No results for input(s): "TSH", "T4TOTAL", "T3FREE", "THYROIDAB" in the last 72 hours.  Invalid input(s): "FREET3"  Other results:   Imaging    No results found.   Medications:     Scheduled Medications:  aspirin  81 mg Oral Daily   atorvastatin  80 mg Oral Daily   Chlorhexidine Gluconate Cloth  6 each Topical Daily   dapagliflozin propanediol  10 mg Oral QAC breakfast   enoxaparin (LOVENOX) injection  60 mg Subcutaneous Q24H   hydrALAZINE  25 mg Oral Q8H   insulin aspart  0-15 Units Subcutaneous TID WC   insulin glargine-yfgn  15 Units Subcutaneous Daily   isosorbide mononitrate  30 mg Oral  Daily   sodium chloride flush  10-40 mL Intracatheter Q12H   sodium chloride flush  3 mL Intravenous Q12H    Infusions:  amiodarone 30 mg/hr (10/31/22 0812)   milrinone 0.125 mcg/kg/min (10/31/22 1224)    PRN Medications: acetaminophen **OR** acetaminophen, albuterol, ALPRAZolam, guaiFENesin-dextromethorphan, melatonin, polyethylene glycol, sodium chloride flush    Patient Profile   Douglas [name] is a 46 year old with a history of HTN, hyperlipidemia, smoker, cocaine abuse,NSVT,  NICM, Medtronic ICD, and chronic HFrEF. Last used cocaine about a week ago.  EF has been down for some time.    Admitted recently with A/C  HFrEF. Suspect he had residual congestion when discharged.  Readmitted 12/11 with A/C HFrEF.  Assessment/Plan   1. A/C HFrEF  - NICM. Has Medtronic ICD. 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   - Discharged last week and suspect he had residual congestion + increased fluid intake.  -  Admitted with NYHA IV symptoms.  - Off carvedilol with low output - Holding entresto/spiro with creatinine close to 2.  - Continue farxiga - Continue hydral. Add imdur. - Will start process for Furoscix after discharge.  - Rise in sCr over the past 24h likely secondary to over diuresis; however, also weaned milrinone yesterday to 0.131mcg/kg/min with mixed venous of 60 this AM, estimated fick cardiac index ~1.7 L/min/m2. Currently not a candidate for home inotropes. I had a lengthy discussion with him today regarding importance of drug cessation to be a candidate for home inotropes. Will hold off diuresis for the time being and continue milrinone at 0.169mcg/kg/min. On exam not overtly in low output state.    2. AKI on CKD Stage IIIb - Creatinine on admit 2. Baseline previously ~ 1.6.  - Cr stable 2.04 today. Follow closely with diuresis.   3. Substance Abuse - Last used cocaine about a week ago.  - Discussed cessation.  - not candidate for advanced therapies   4. CAD - LHC 02/2022 nonobstructive CAD - LDL 124  - Increased atorvastatin to 80 mg daily    5. DMII -On SSI   6. HTN  - BP improved   7. NSVT - Previously on amio but he stopped in April of this year.  I am not sure why he stopped.  - Frequent burst of NSVT noted on device since the beginning of December.  - Now on IV amio d/t frequent runs NSVT.  - Continue IV amio while on inotrope support - Keep K > 4.0 Mg > 2.0  - Off Coreg with low-output   8. Obesity  - Body mass index is 33.02 kg/m.   9. MR  - TEE 12/14: EF 20%, Severe central MR   Length of Stay: 6  Douglas Vandiver, DO  12:47 PM  Advanced Heart Failure  Team Pager [phone] (M-F; 7a - 5p)  Please contact [facility] for night-coverage after hours (5p -7a ) and weekends on [email or web address]

## 2022-10-31 NOTE — Hospital Course (Addendum)
[name] is a 46 y.o. M with sCHF EF 20% s/p ICD, polysubstance abuse, HTN, obesity, and CAD who presented with progressive shortness of breath.   12/11 Admitted for acute on chronic systolic CHF on IV diuretics 12/12 Advanced HF team consulted 12/13 Milrinone started 12/20 CT surgery and LVAD SW consulted  12/26 underwent unsuccessful mitral valve repair required pressors and intubation.  Currently transferred to ICU.  TR[facility] will sign off.

## 2022-10-31 NOTE — Progress Notes (Signed)
  Progress Note   Patient: Douglas [name] [id number] DOB: May 11, 1976 DOA: 10/25/2022     6 DOS: the patient was seen and examined on 10/31/2022 at 10:42AM      Brief hospital course: Douglas [name] is a 46 y.o. M with sCHF EF 20 s/p ICD, polysubstance abuse, HTN, obesity, and CAD who presente dwith progressive shortness of breath.       Assessment and Plan: Acute on chronic systolic congestive heart failure Mitral regurgitation Recorded I's and O's net -600 cc, but weight is up 3 kg.  Creatinine slightly up, cardiology feel he is volume down. - Off milrinone and diuretics per cardiology - Beta-blocker held due to low output - ARNI held due to renal insufficiency - Continue Farxiga, Imdur, hydralazine -Continue daily BMP, daily ins and outs   Acute kidney injury on CKD stage IIIb Baseline creatinine 1.6, creatinine 2 on admission. -Diuretics and milrinone per cardiology  Diabetes Glucose well-controlled - Continue glargine - Continue sliding scale corrections -Continue Farxiga  Hypertension Blood pressure mostly controlled - Continue Imdur, furosemide, hydralazine  Coronary artery disease - Continue atorvastatin, aspirin  Obesity BMI 33  Nonsustained ventricular tachycardia - Hold beta-blocker -Keep magnesium greater than 2, potassium greater than 4  Hyperlipidemia -Continue atorvastatin  Depression -Continue Xanax in the hospital  Polysubstance abuse Encourage cessation, denies recent use     Subjective: Patient has no complaints, he is just overall comfortable.  No respiratory symptoms, no chest pain.     Physical Exam: BP (!) 137/95 (BP Location: Left Arm)   Pulse 83   Temp 98.6 F (37 C) (Oral)   Resp 18   Ht 6\' 4"  (1.93 m)   Wt 123.1 kg   SpO2 93%   BMI 33.02 kg/m   Obese adult male, lying in bed, no acute distress RRR, no murmurs, no peripheral edema, JVP not visible due to body habitus Respiratory rate normal, lungs clear  without rales or wheezes Abdomen soft without tenderness palpation or guarding, no ascites or distention Attention normal, affect appropriate, judgment and insight appear normal    Data Reviewed: Creatinine slightly up, sodium normal, potassium down to 3.7 SCV O2 down to 60   Family Communication: None present    Disposition: Status is: Inpatient         Author: , MD 10/31/2022 1:24 PM  For on call review [email or web address].

## 2022-11-01 DIAGNOSIS — F321 Major depressive disorder, single episode, moderate: Secondary | ICD-10-CM | POA: Diagnosis not present

## 2022-11-01 DIAGNOSIS — N179 Acute kidney failure, unspecified: Secondary | ICD-10-CM | POA: Diagnosis not present

## 2022-11-01 DIAGNOSIS — Z66 Do not resuscitate: Secondary | ICD-10-CM | POA: Diagnosis not present

## 2022-11-01 DIAGNOSIS — Z006 Encounter for examination for normal comparison and control in clinical research program: Secondary | ICD-10-CM | POA: Diagnosis not present

## 2022-11-01 DIAGNOSIS — I5023 Acute on chronic systolic (congestive) heart failure: Secondary | ICD-10-CM | POA: Diagnosis not present

## 2022-11-01 DIAGNOSIS — I5043 Acute on chronic combined systolic (congestive) and diastolic (congestive) heart failure: Secondary | ICD-10-CM | POA: Diagnosis not present

## 2022-11-01 DIAGNOSIS — I10 Essential (primary) hypertension: Secondary | ICD-10-CM | POA: Diagnosis not present

## 2022-11-01 DIAGNOSIS — I130 Hypertensive heart and chronic kidney disease with heart failure and stage 1 through stage 4 chronic kidney disease, or unspecified chronic kidney disease: Secondary | ICD-10-CM | POA: Diagnosis not present

## 2022-11-01 LAB — BASIC METABOLIC PANEL
Anion gap: 10 (ref 5–15)
BUN: 34 mg/dL — ABNORMAL HIGH (ref 6–20)
CO2: 28 mmol/L (ref 22–32)
Calcium: 8.6 mg/dL — ABNORMAL LOW (ref 8.9–10.3)
Chloride: 100 mmol/L (ref 98–111)
Creatinine, Ser: 1.86 mg/dL — ABNORMAL HIGH (ref 0.61–1.24)
GFR, Estimated: 44 mL/min — ABNORMAL LOW (ref >60)
Glucose, Bld: 105 mg/dL — ABNORMAL HIGH (ref 70–99)
Potassium: 3.3 mmol/L — ABNORMAL LOW (ref 3.5–5.1)
Sodium: 138 mmol/L (ref 135–145)

## 2022-11-01 LAB — GLUCOSE, CAPILLARY
Glucose-Capillary: 119 mg/dL — ABNORMAL HIGH (ref 70–99)
Glucose-Capillary: 169 mg/dL — ABNORMAL HIGH (ref 70–99)
Glucose-Capillary: 149 mg/dL — ABNORMAL HIGH (ref 70–99)
Glucose-Capillary: 134 mg/dL — ABNORMAL HIGH (ref 70–99)

## 2022-11-01 LAB — MAGNESIUM: Magnesium: 2.1 mg/dL (ref 1.7–2.4)

## 2022-11-01 LAB — COOXEMETRY PANEL
Carboxyhemoglobin: 1 % (ref 0.5–1.5)
Methemoglobin: 1.2 % (ref 0.0–1.5)
O2 Saturation: 54 %
Total hemoglobin: 16.2 g/dL — ABNORMAL HIGH (ref 12.0–16.0)

## 2022-11-01 MED ORDER — FUROSEMIDE 10 MG/ML IJ SOLN
80.0000 mg | Freq: Two times a day (BID) | INTRAMUSCULAR | Status: DC
  Administered 2022-11-01 – 2022-11-02 (×2): 80 mg via INTRAVENOUS
  Filled 2022-11-01 (×2): qty 8

## 2022-11-01 MED ORDER — POTASSIUM CHLORIDE CRYS ER 20 MEQ PO TBCR
40.0000 meq | EXTENDED_RELEASE_TABLET | Freq: Two times a day (BID) | ORAL | Status: AC
  Administered 2022-11-01 (×2): 40 meq via ORAL
  Filled 2022-11-01 (×2): qty 2

## 2022-11-01 MED ORDER — TORSEMIDE 100 MG PO TABS
100.0000 mg | ORAL_TABLET | Freq: Once | ORAL | Status: AC
  Administered 2022-11-01: 100 mg via ORAL
  Filled 2022-11-01: qty 1

## 2022-11-01 NOTE — Assessment & Plan Note (Signed)
-   Continue amiodarone infusion - Continue magnesium and potassium supplements to keep above 2 and 4 respectively

## 2022-11-01 NOTE — Assessment & Plan Note (Signed)
Continue atorvastatin

## 2022-11-01 NOTE — Progress Notes (Signed)
Came to see pt to assist with ambulation but pt declined d/t back pain. Encouraged moving in bed and in room as tolerated.   Douglas [name] 11/01/2022 1:59 PM'

## 2022-11-01 NOTE — Assessment & Plan Note (Signed)
-   Continue Xanax 

## 2022-11-01 NOTE — Assessment & Plan Note (Signed)
Glucose controlled - Continue glargine - Continue sliding scale corrections - Continue Farxiga - Continue aspirin and atorvastatin

## 2022-11-01 NOTE — Progress Notes (Signed)
  Progress Note   Patient: Douglas [name] [id number] DOB: 07-Aug-1976 DOA: 10/25/2022     7 DOS: the patient was seen and examined on 11/01/2022 at 9:10AM      Brief hospital course: Douglas [name] is a 46 y.o. M with sCHF EF 20 s/p ICD, polysubstance abuse, HTN, obesity, and CAD who presente dwith progressive shortness of breath.       Assessment and Plan: * Acute on chronic systolic CHF (congestive heart failure) (HCC) Mitral regurgitation Over weekend, milrinone at 125 and getting toresemide 100 daily. In last 24, Weight down, net -2 L, creatinine improved, SCVo2 up to 54 - Milrinone and diuretics per cardiology - Beta-blocker held due to low output - ARNI held due to renal insufficiency - Continue Farxiga, Imdur, hydralazine - Continue CHF bundle    Acute renal failure superimposed on stage IIIb chronic kidney disease (HCC) Baseline creatinine 1.6, creatinine on admission 2, down to 1.8 today with diuresis  NSVT (nonsustained ventricular tachycardia) (HCC) - Continue amiodarone infusion - Continue magnesium and potassium supplements to keep above 2 and 4 respectively  Mitral regurgitation See above  Depression, major, single episode - Continue Xanax  Obesity BMI 32.5  Cocaine use Reports cessation  Essential hypertension Blood pressure controlled - Continue imdur, hydralazine  Hyperlipidemia - Continue atorvastatin  Diabetes mellitus with stage 2 chronic kidney disease (HCC) Glucose controlled - Continue glargine - Continue sliding scale corrections - Continue Farxiga - Continue aspirin and atorvastatin  Hypokalemia - Supplement potassium        Subjective: Patient has no complaints.  Still feels somewhat out of breath.  No orthopnea no confusion, no chest pain.     Physical Exam: BP 117/87 (BP Location: Left Arm)   Pulse 87   Temp (!) 97.4 F (36.3 C) (Oral)   Resp 18   Ht 6\' 4"  (1.93 m)   Wt 121.4 kg   SpO2 92%   BMI 32.59  kg/m   Obese adult male, lying on bed, no acute distress, interactive RRR, soft systolic murmur, I cannot appreciate JVP due to body habitus, no significant lower extremity edema Respiratory rate normal, lungs clear without rales or wheezes Abdomen soft without tenderness to palpation Attention normal, affect appropriate, judgment insight appear normal    Data Reviewed: Patient metabolic panel shows creatinine 1.8, potassium 3.3 Glucose normal Coags shows SCV O2 54  Family Communication: None present    Disposition: Status is: Inpatient Per cardioloigy        Author: , MD 11/01/2022 10:14 AM  For on call review [email or web address].

## 2022-11-01 NOTE — Assessment & Plan Note (Signed)
BMI 32.5 

## 2022-11-01 NOTE — Progress Notes (Addendum)
Advanced Heart Failure Rounding Note  PCP-Cardiologist: None   Subjective:    12/13: Milrinone added for low-output, IV amio for frequent NSVT 12/16: Milrinone decreased to 0.125  CO-OX 54% on milrinone 0.125.  CVP 9. Diuretics on hold. Scr improving, 2.04>1.86.  Reports intermittent orthopnea and dyspnea with exertion. RN reports he's had some improvement in symptoms with Ativan.   Objective:   Weight Range: 121.4 kg Body mass index is 32.59 kg/m.   Vital Signs:   Temp:  [97.4 F (36.3 C)-97.9 F (36.6 C)] 97.4 F (36.3 C) (12/18 0408) Pulse Rate:  [81-86] 81 (12/18 0408) Resp:  [18-22] 20 (12/18 0408) BP: (128-147)/(87-95) 128/89 (12/18 0408) SpO2:  [90 %-94 %] 92 % (12/18 0408) Weight:  [121.4 kg] 121.4 kg (12/18 0116) Last BM Date : 10/31/22  Weight change: Filed Weights   10/30/22 0521 10/31/22 0505 11/01/22 0116  Weight: 120.5 kg 123.1 kg 121.4 kg    Intake/Output:   Intake/Output Summary (Last 24 hours) at 11/01/2022 0754 Last data filed at 11/01/2022 0125 Gross per 24 hour  Intake 1080 ml  Output 3150 ml  Net -2070 ml     Physical Exam    General:  Sitting up on side of bed. No distress. HEENT: normal Neck: supple. JVP difficult but does not appear elevated. CVP 9. Carotids 2+ bilat; no bruits.  Cor: PMI nondisplaced. Regular rate & rhythm. No rubs, gallops, 2/6 MR murmur Lungs: clear Abdomen: soft, nontender, nondistended. No hepatosplenomegaly. No bruits or masses. Good bowel sounds. Extremities: no cyanosis, clubbing, rash, 1+ edema Neuro: alert & orientedx3, cranial nerves grossly intact. moves all 4 extremities w/o difficulty. Affect pleasant    Telemetry   Sinus 80s, runs of NSVT (longest this am 23 beats), variable PVCs up to 10/min   Labs    CBC No results for input(s): "WBC", "NEUTROABS", "HGB", "HCT", "MCV", "PLT" in the last 72 hours. Basic Metabolic Panel Recent Labs    29/52/84 0428 10/31/22 0655 11/01/22 0520   NA 137 136 138  K 3.4* 3.7 3.3*  CL 99 100 100  CO2 29 27 28   GLUCOSE 114* 129* 105*  BUN 37* 36* 34*  CREATININE 1.68* 2.04* 1.86*  CALCIUM 8.4* 8.5* 8.6*  MG 2.0  --   --    Liver Function Tests No results for input(s): "AST", "ALT", "ALKPHOS", "BILITOT", "PROT", "ALBUMIN" in the last 72 hours. No results for input(s): "LIPASE", "AMYLASE" in the last 72 hours. Cardiac Enzymes No results for input(s): "CKTOTAL", "CKMB", "CKMBINDEX", "TROPONINI" in the last 72 hours.  BNP: BNP (last 3 results) Recent Labs    10/18/22 1821 10/25/22 1442 10/28/22 1825  BNP 1,494.7* 1,428.2* 491.6*    ProBNP (last 3 results) No results for input(s): "PROBNP" in the last 8760 hours.   D-Dimer No results for input(s): "DDIMER" in the last 72 hours. Hemoglobin A1C No results for input(s): "HGBA1C" in the last 72 hours. Fasting Lipid Panel No results for input(s): "CHOL", "HDL", "LDLCALC", "TRIG", "CHOLHDL", "LDLDIRECT" in the last 72 hours. Thyroid Function Tests No results for input(s): "TSH", "T4TOTAL", "T3FREE", "THYROIDAB" in the last 72 hours.  Invalid input(s): "FREET3"  Other results:   Imaging    No results found.   Medications:     Scheduled Medications:  aspirin  81 mg Oral Daily   atorvastatin  80 mg Oral Daily   Chlorhexidine Gluconate Cloth  6 each Topical Daily   dapagliflozin propanediol  10 mg Oral QAC breakfast   enoxaparin (  LOVENOX) injection  60 mg Subcutaneous Q24H   hydrALAZINE  25 mg Oral Q8H   insulin aspart  0-15 Units Subcutaneous TID WC   insulin glargine-yfgn  15 Units Subcutaneous Daily   isosorbide mononitrate  30 mg Oral Daily   potassium chloride  40 mEq Oral Daily   sodium chloride flush  10-40 mL Intracatheter Q12H   sodium chloride flush  3 mL Intravenous Q12H    Infusions:  amiodarone 30 mg/hr (10/31/22 2144)   milrinone 0.125 mcg/kg/min (10/31/22 1224)    PRN Medications: acetaminophen **OR** acetaminophen, albuterol,  ALPRAZolam, guaiFENesin-dextromethorphan, melatonin, polyethylene glycol, sodium chloride flush    Patient Profile   Mr. Route is a 46 year old with a history of HTN, hyperlipidemia, smoker, cocaine abuse,NSVT,  NICM, Medtronic ICD, and chronic HFrEF. Last used cocaine about a week ago.  EF has been down for some time.    Admitted recently with A/C HFrEF. Suspect he had residual congestion when discharged.  Readmitted 12/11 with A/C HFrEF.  Assessment/Plan   1. A/C HFrEF  - NICM. Has Medtronic ICD. 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   - Discharged last week and suspect he had residual congestion + increased fluid intake.  - Admitted with NYHA IV symptoms.  - TEE this admit EF 20%, severe central MR - Milrinone added 12/13 for low-output. Bump in Scr 12/17 after initiated milrinone wean +/- overdiuresis. - CVP 9 this am. Does not appear significantly overloaded. Reporting orthopnea and dyspnea. ? Component of anxiety d/t improvement with Ativan but also worry may be d/t progressive HF. Give 100 mg Torsemide po today (on 100 mg q am, 50 q pm at home) - Off carvedilol with low output - Holding entresto/spiro with creatinine close to 2.  - Continue farxiga - Continue hydral and imdur - Will start process for Furoscix after discharge.  - Currently not a candidate for home inotropes or advanced therapies given substance abuse and noncompliance.    2. AKI on CKD Stage IIIb - Creatinine on admit 2. Baseline previously ~ 1.6.  - Cr improving today, 1.86.   3. Substance Abuse - Last used cocaine about a week PTA. - Discussed cessation.  - not candidate for advanced therapies   4. CAD - LHC 02/2022 nonobstructive CAD - LDL 124  - Increased atorvastatin to 80 mg daily    5. DMII -On SSI -Continue Farxiga   6. HTN  - BP improved   7. NSVT - Previously on amio but he stopped in April of this year.  I am not sure why he stopped.  - Frequent burst of NSVT noted on  device since the beginning of December.  - Now on IV amio d/t frequent runs NSVT.  - Continue IV amio while on inotrope support - Keep K > 4.0 Mg > 2.0  - Supp K today, check mag. - Off Coreg with low-output   8. Obesity  - Body mass index is 32.59 kg/m.   9. MR  - TEE 12/14: EF 20%, Severe central MR - May consider mTEER if HF not too advanced  SDOH: -Has medicaid -Works part-time at [employer].  -HF TOC CM following and assisting with housing. Lives with aunt.  Length of Stay: 7  FINCH, LINDSAY N, PA-C  7:54 AM  Advanced Heart Failure Team Pager [phone] (M-F; 7a - 5p)  Please contact [facility] for night-coverage after hours (5p -7a ) and weekends on [email or web address]   Patient seen and examined with  the above-signed Systems developer and/or Housestaff. I personally reviewed laboratory data, imaging studies and relevant notes. I independently examined the patient and formulated the important aspects of the plan. I have edited the note to reflect any of my changes or salient points. I have personally discussed the plan with the patient and/or family.  Remains SOB with any exertion. Remains on milrinone 0.125. Co-ox 54%. CVP 13-14 on my read.   General:  Sitting up  No resp difficulty HEENT: normal Neck: supple. JVP to jaw  Carotids 2+ bilat; no bruits. No lymphadenopathy or thryomegaly appreciated. Cor: PMI nondisplaced. Regular rate & rhythm. 2/6 MR Lungs: clear Abdomen: soft, nontender, nondistended. No hepatosplenomegaly. No bruits or masses. Good bowel sounds. Extremities: no cyanosis, clubbing, rash, tr edema Neuro: alert & orientedx3, cranial nerves grossly intact. moves all 4 extremities w/o difficulty. Affect pleasant  Co-ox low despite milrinone. CVP up. Will continue milrinone. Switch back to IV lasix. Suspect he will need RHC. Has not been candidate for advanced therapies due to ongoing cocaine use. Will get SW involved to discuss his situation further as  LVAD may be only way out of this.   [name], MD  4:11 PM

## 2022-11-01 NOTE — Assessment & Plan Note (Signed)
Baseline creatinine 1.6, creatinine on admission 2, down to 1.8 today with diuresis

## 2022-11-01 NOTE — Assessment & Plan Note (Signed)
See above

## 2022-11-01 NOTE — Assessment & Plan Note (Signed)
Reports cessation 

## 2022-11-01 NOTE — Progress Notes (Signed)
Pt called RN. Per pt was sitting at the edge of his bed and began having back pain then pain in the middle of his chest. Per pt had a bit of shortness of breath.  Monitor showed NSR w PVCs, HR on the 80s at this time. Pain lasted less than a minute per pt.  EKG was completed. Pt denies any more pain. MD spoke w pt at bedside. Lillia Abed PA also aware.   2:17 PM  Per CCMD pt had 16bt run of vtach. HR up to 180 non-sustained.  Currently NSR HR of 87.  Pt denies having symptoms. No chest pain or shortness of breath.  Pt currently in bed with call bell within reach.

## 2022-11-01 NOTE — Progress Notes (Signed)
I was made aware patient had an episode of chest discomfort this afternoon while sitting up on side of bed. Pain started in back and radiated to his chest. Reports relief with lying on his side and PRN Xanax.   He had no significant CAD on LHC in 04/23.  Currently pain free. ECG nonischemic. Rhythm sinus with PVCs on telemetry.   ? Some of chest discomfort d/t residual volume on board. Resumed IV lasix this am.

## 2022-11-01 NOTE — Assessment & Plan Note (Signed)
Blood pressure controlled - Continue imdur, hydralazine

## 2022-11-01 NOTE — Assessment & Plan Note (Addendum)
Mitral regurgitation Over weekend, milrinone at 125 and getting toresemide 100 daily. In last 24, Weight down, net -2 L, creatinine improved, SCVo2 up to 54 - Milrinone and diuretics per cardiology - Beta-blocker held due to low output - ARNI held due to renal insufficiency - Continue Farxiga, Imdur, hydralazine - Continue CHF bundle

## 2022-11-02 ENCOUNTER — Inpatient Hospital Stay (HOSPITAL_COMMUNITY): Payer: Medicaid Other

## 2022-11-02 DIAGNOSIS — I5023 Acute on chronic systolic (congestive) heart failure: Secondary | ICD-10-CM | POA: Diagnosis not present

## 2022-11-02 DIAGNOSIS — I10 Essential (primary) hypertension: Secondary | ICD-10-CM | POA: Diagnosis not present

## 2022-11-02 DIAGNOSIS — F321 Major depressive disorder, single episode, moderate: Secondary | ICD-10-CM | POA: Diagnosis not present

## 2022-11-02 DIAGNOSIS — N179 Acute kidney failure, unspecified: Secondary | ICD-10-CM | POA: Diagnosis not present

## 2022-11-02 LAB — HIV ANTIBODY (ROUTINE TESTING W REFLEX): HIV Screen 4th Generation wRfx: NONREACTIVE

## 2022-11-02 LAB — COMPREHENSIVE METABOLIC PANEL
ALT: 22 U/L (ref 0–44)
AST: 19 U/L (ref 15–41)
Albumin: 2.7 g/dL — ABNORMAL LOW (ref 3.5–5.0)
Alkaline Phosphatase: 63 U/L (ref 38–126)
Anion gap: 10 (ref 5–15)
BUN: 33 mg/dL — ABNORMAL HIGH (ref 6–20)
CO2: 26 mmol/L (ref 22–32)
Calcium: 8.6 mg/dL — ABNORMAL LOW (ref 8.9–10.3)
Chloride: 98 mmol/L (ref 98–111)
Creatinine, Ser: 1.98 mg/dL — ABNORMAL HIGH (ref 0.61–1.24)
GFR, Estimated: 41 mL/min — ABNORMAL LOW (ref >60)
Glucose, Bld: 131 mg/dL — ABNORMAL HIGH (ref 70–99)
Potassium: 4 mmol/L (ref 3.5–5.1)
Sodium: 134 mmol/L — ABNORMAL LOW (ref 135–145)
Total Bilirubin: 1.4 mg/dL — ABNORMAL HIGH (ref 0.3–1.2)
Total Protein: 6.8 g/dL (ref 6.5–8.1)

## 2022-11-02 LAB — CBC WITH DIFFERENTIAL/PLATELET
Abs Immature Granulocytes: 0.03 10*3/uL (ref 0.00–0.07)
Basophils Absolute: 0 10*3/uL (ref 0.0–0.1)
Basophils Relative: 0 %
Eosinophils Absolute: 0.1 10*3/uL (ref 0.0–0.5)
Eosinophils Relative: 1 %
HCT: 43.6 % (ref 39.0–52.0)
Hemoglobin: 14.6 g/dL (ref 13.0–17.0)
Immature Granulocytes: 0 %
Lymphocytes Relative: 27 %
Lymphs Abs: 2.6 10*3/uL (ref 0.7–4.0)
MCH: 28.5 pg (ref 26.0–34.0)
MCHC: 33.5 g/dL (ref 30.0–36.0)
MCV: 85 fL (ref 80.0–100.0)
Monocytes Absolute: 1.3 10*3/uL — ABNORMAL HIGH (ref 0.1–1.0)
Monocytes Relative: 14 %
Neutro Abs: 5.6 10*3/uL (ref 1.7–7.7)
Neutrophils Relative %: 58 %
Platelets: 267 10*3/uL (ref 150–400)
RBC: 5.13 MIL/uL (ref 4.22–5.81)
RDW: 13.1 % (ref 11.5–15.5)
WBC: 9.7 10*3/uL (ref 4.0–10.5)
nRBC: 0 % (ref 0.0–0.2)

## 2022-11-02 LAB — URINALYSIS, ROUTINE W REFLEX MICROSCOPIC
Bilirubin Urine: NEGATIVE
Glucose, UA: >=500 mg/dL — AB
Hgb urine dipstick: NEGATIVE
Ketones, ur: NEGATIVE mg/dL
Leukocytes,Ua: NEGATIVE
Nitrite: NEGATIVE
Protein, ur: 100 mg/dL — AB
Specific Gravity, Urine: 1.009 (ref 1.005–1.030)
pH: 6 (ref 5.0–8.0)

## 2022-11-02 LAB — HEPATITIS B SURFACE ANTIGEN: Hepatitis B Surface Ag: NONREACTIVE

## 2022-11-02 LAB — PREALBUMIN: Prealbumin: 24 mg/dL (ref 18–38)

## 2022-11-02 LAB — RAPID URINE DRUG SCREEN, HOSP PERFORMED
Amphetamines: NOT DETECTED
Barbiturates: NOT DETECTED
Benzodiazepines: POSITIVE — AB
Cocaine: NOT DETECTED
Opiates: NOT DETECTED
Tetrahydrocannabinol: NOT DETECTED

## 2022-11-02 LAB — PROTIME-INR
INR: 1.2 (ref 0.8–1.2)
Prothrombin Time: 15.3 seconds — ABNORMAL HIGH (ref 11.4–15.2)

## 2022-11-02 LAB — TYPE AND SCREEN
ABO/RH(D): A NEG
Antibody Screen: NEGATIVE

## 2022-11-02 LAB — COOXEMETRY PANEL
Carboxyhemoglobin: 2 % — ABNORMAL HIGH (ref 0.5–1.5)
Methemoglobin: 1 % (ref 0.0–1.5)
O2 Saturation: 63.8 %
Total hemoglobin: 15.4 g/dL (ref 12.0–16.0)

## 2022-11-02 LAB — GLUCOSE, CAPILLARY
Glucose-Capillary: 138 mg/dL — ABNORMAL HIGH (ref 70–99)
Glucose-Capillary: 167 mg/dL — ABNORMAL HIGH (ref 70–99)
Glucose-Capillary: 120 mg/dL — ABNORMAL HIGH (ref 70–99)
Glucose-Capillary: 160 mg/dL — ABNORMAL HIGH (ref 70–99)

## 2022-11-02 LAB — BASIC METABOLIC PANEL
Anion gap: 10 (ref 5–15)
BUN: 32 mg/dL — ABNORMAL HIGH (ref 6–20)
CO2: 27 mmol/L (ref 22–32)
Calcium: 8.9 mg/dL (ref 8.9–10.3)
Chloride: 100 mmol/L (ref 98–111)
Creatinine, Ser: 1.89 mg/dL — ABNORMAL HIGH (ref 0.61–1.24)
GFR, Estimated: 43 mL/min — ABNORMAL LOW (ref >60)
Glucose, Bld: 123 mg/dL — ABNORMAL HIGH (ref 70–99)
Potassium: 3.9 mmol/L (ref 3.5–5.1)
Sodium: 137 mmol/L (ref 135–145)

## 2022-11-02 LAB — HEPATITIS B SURFACE ANTIBODY,QUALITATIVE: Hep B S Ab: REACTIVE — AB

## 2022-11-02 LAB — URIC ACID: Uric Acid, Serum: 8.1 mg/dL (ref 3.7–8.6)

## 2022-11-02 LAB — TSH: TSH: 2.959 u[IU]/mL (ref 0.350–4.500)

## 2022-11-02 LAB — APTT: aPTT: 28 seconds (ref 24–36)

## 2022-11-02 LAB — PSA: Prostatic Specific Antigen: 0.5 ng/mL (ref 0.00–4.00)

## 2022-11-02 LAB — ANTITHROMBIN III: AntiThromb III Func: 113 % (ref 75–120)

## 2022-11-02 LAB — LACTATE DEHYDROGENASE: LDH: 224 U/L — ABNORMAL HIGH (ref 98–192)

## 2022-11-02 LAB — T4, FREE: Free T4: 1.16 ng/dL — ABNORMAL HIGH (ref 0.61–1.12)

## 2022-11-02 LAB — MAGNESIUM: Magnesium: 2.1 mg/dL (ref 1.7–2.4)

## 2022-11-02 LAB — ABO/RH: ABO/RH(D): A NEG

## 2022-11-02 LAB — HEPATITIS B CORE ANTIBODY, TOTAL: Hep B Core Total Ab: NONREACTIVE

## 2022-11-02 LAB — HEPATITIS C ANTIBODY: HCV Ab: NONREACTIVE

## 2022-11-02 MED ORDER — POTASSIUM CHLORIDE CRYS ER 20 MEQ PO TBCR
40.0000 meq | EXTENDED_RELEASE_TABLET | Freq: Two times a day (BID) | ORAL | Status: AC
  Administered 2022-11-02 (×2): 40 meq via ORAL
  Filled 2022-11-02 (×2): qty 2

## 2022-11-02 MED ORDER — TORSEMIDE 100 MG PO TABS
50.0000 mg | ORAL_TABLET | Freq: Every day | ORAL | Status: DC
  Administered 2022-11-02: 50 mg via ORAL

## 2022-11-02 MED ORDER — TORSEMIDE 100 MG PO TABS
100.0000 mg | ORAL_TABLET | Freq: Every day | ORAL | Status: DC
  Administered 2022-11-03: 100 mg via ORAL
  Filled 2022-11-02 (×2): qty 1

## 2022-11-02 NOTE — Progress Notes (Signed)
MCS EDUCATION NOTE:                VAD evaluation consent reviewed and signed by [name].  Initial VAD teaching completed with Douglas [name] and caregiver.   VAD educational packet including "Understanding Your Options with Advanced Heart Failure", "[facility] Patient Agreement for VAD Evaluation and Potential Implantation" consent, and [employer] "Heartmate 3 Left Ventricular Device (LVAD) Patient Guide", Heartmate 3 Left Ventricular Assist System Patient Education Program DVD", "[facility] HM III Patient Education", "[place] Mechanical Circulatory Support Program", and "Decision Aids for Left Ventricular Assist Device" reviewed in detail and left at bedside for continued reference.   All questions answered regarding VAD implant, hospital stay, and what to expect when discharged home living with a heart pump. Douglas [name] identified his sister as his primary caregiver.  Explained need for 24/7 care when Douglas [name] is discharged home due to sternal precautions, adaptation to living on support, emotional support, consistent and meticulous exit site care and management, medication adherence and high volume of follow up visits with the VAD Clinic after discharge; both Douglas [name] and caregiver verbalized understanding of above.   Explained that LVAD can be implanted for two indications in the setting of advanced left ventricular heart failure treatment:  Bridge to transplant - used for patients who cannot safely wait for heart transplant without this device.  Or    Destination therapy - used for patients until end of life or recovery of heart function.  Patient and caregiver(s) acknowledge that the indication at this point in time for LVAD therapy would be for DT due to drug use.   Provided brief equipment overview with HeartMate III including discussion on the following:   a) mobile power unit b) system controller   c) universal Magazine features editor   d) battery clips   e) Batteries   f)  Perc lock   g) Percutaneous lead    Discussed:  a) changing power source on system controller from tethered (MPU) to untethered (battery) mode   b) changing power source on system controller from untethered (battery) to tethered (MPU) mode   c) how to monitor battery life both on the system controller and on each individual battery   d) changing batteries   Reviewed and supplied a copy of home inspection check list stressing that only three pronged grounded power outlets can be used for VAD equipment. Mr Cardell confirmed home has electrical outlets that will support the equipment along with access working telephone. Douglas [name] tells me that he is currently sleeping on the floor at his aunt's home.  Identified the following lifestyle modifications while living on MCS:    1. No driving for at least three months and then only if doctor gives permission to do so.   2. No tub baths while pump implanted, and shower only when doctor gives permission.   3. No swimming or submersion in water while implanted with pump.   4. No contact sports or engaging in jumping activities.   5. Always have a backup controller, charged spare batteries, and battery clips nearby at all times in case of emergency.   6. Call the doctor or hospital contact person if any change in how the pump sounds, feels, or works.   7. Plan to sleep only when connected to the power module.   8. Do not sleep on your stomach.   9. Keep a backup system controller, charged batteries, battery clips, and flashlight near you during sleep in case of electrical power  outage.   10. Exit site care including dressing changes, monitoring for infection, and importance of keeping percutaneous lead stabilized at all times.    Extended the option to have one of our current patients and caregiver(s) come to talk with them about living on support} to assist with decision making.   Reviewed pictures of VAD drive line, site care, dressing changes, and drive line stabilization including  securement attachment device and abdominal binder. Discussed with Douglas [name] and family that they will be required to purchase dressing supplies as long as patient has the VAD in place.   He will also need to abide by sternal precautions with no lifting >10lbs, pushing, pulling and will need assistance with adapting to new life style with VAD equipment and care.   Intermacs patient survival statistics through June 2023 reviewed with patient and caregiver as follows:                                                  The patient understands that from this discussion it does not mean that they will receive the device, but that depends on an extensive evaluation process. The patient is aware of the fact that if at anytime they want to stop the evaluation process they can.  All questions have been answered at this time and contact information was provided should they encounter any further questions.  They are both agreeable at this time to the evaluation process and will move forward.    Total Session Time: 30 minutes  Tanda Rockers, RN VAD Coordinator   Office: [phone] 24/7 VAD Pager: [phone]

## 2022-11-02 NOTE — Plan of Care (Signed)
  Problem: Education: Goal: Ability to demonstrate management of disease process will improve Outcome: Progressing   Problem: Cardiac: Goal: Ability to achieve and maintain adequate cardiopulmonary perfusion will improve Outcome: Progressing   Problem: Health Behavior/Discharge Planning: Goal: Ability to manage health-related needs will improve Outcome: Progressing   Problem: Coping: Goal: Ability to adjust to condition or change in health will improve Outcome: Progressing

## 2022-11-02 NOTE — Progress Notes (Signed)
   11/02/22 1600  Mobility  Activity Ambulated with assistance in hallway  Level of Assistance Contact guard assist, steadying assist  Assistive Device None  Distance Ambulated (ft) 230 ft  Activity Response Tolerated well  Mobility Referral Yes  $Mobility charge 1 Mobility   Mobility Specialist Progress Note  Pre-Mobility: 96% SpO2(RA)  During Mobility: 87-92%% SpO2(RA) Post-Mobility: 93% SpO2(RA)  Pt was in bed and agreeable. X1 standing break d/t SOB but recovered after pursed lip breathing. Returned to bed w/ all needs met and call bell in reach.   Lucious Groves Mobility Specialist  Please contact via SecureChat or Rehab office at [phone]

## 2022-11-02 NOTE — Progress Notes (Addendum)
Advanced Heart Failure Rounding Note  PCP-Cardiologist: None   Subjective:    12/13: Milrinone added for low-output, IV amio for frequent NSVT 12/16: Milrinone decreased to 0.125  Remains on Milrinone 0.125. Co-ox 64%.   3L in UOP yesterday w/ IV Lasix. CVP down to 6 today.   Scr  2.04>1.86>>1.89.  Had 20 beat run of VT overnight. Remains on amio gtt at 30/hr  Mg 2.1 K 3.9   Feels better today. No complaints.   Objective:   Weight Range: 121.6 kg Body mass index is 32.63 kg/m.   Vital Signs:   Temp:  [97.7 F (36.5 C)-98.3 F (36.8 C)] 98 F (36.7 C) (12/19 0721) Pulse Rate:  [71-89] 82 (12/19 0721) Resp:  [18-22] 20 (12/19 0721) BP: (112-122)/(81-108) 117/96 (12/19 0721) SpO2:  [94 %-98 %] 95 % (12/19 0721) Weight:  [121.6 kg] 121.6 kg (12/19 0400) Last BM Date : 11/01/22  Weight change: Filed Weights   10/31/22 0505 11/01/22 0116 11/02/22 0400  Weight: 123.1 kg 121.4 kg 121.6 kg    Intake/Output:   Intake/Output Summary (Last 24 hours) at 11/02/2022 0914 Last data filed at 11/02/2022 0429 Gross per 24 hour  Intake 2704 ml  Output 2750 ml  Net -46 ml     Physical Exam    CVP 6  General:  Well appearing. No respiratory difficulty HEENT: normal Neck: supple. JVD 6 cm. Carotids 2+ bilat; no bruits. No lymphadenopathy or thyromegaly appreciated. Cor: PMI nondisplaced. Regular rate & rhythm. No rubs, gallops or murmurs. Lungs: clear Abdomen: soft, nontender, nondistended. No hepatosplenomegaly. No bruits or masses. Good bowel sounds. Extremities: no cyanosis, clubbing, rash, edema Neuro: alert & oriented x 3, cranial nerves grossly intact. moves all 4 extremities w/o difficulty. Affect pleasant.   Telemetry   NSR w/ frequent PVCs, 20 beat run of NSVT    Labs    CBC No results for input(s): "WBC", "NEUTROABS", "HGB", "HCT", "MCV", "PLT" in the last 72 hours. Basic Metabolic Panel Recent Labs    00/93/81 0520 11/02/22 0420  NA 138 137   K 3.3* 3.9  CL 100 100  CO2 28 27  GLUCOSE 105* 123*  BUN 34* 32*  CREATININE 1.86* 1.89*  CALCIUM 8.6* 8.9  MG 2.1 2.1   Liver Function Tests No results for input(s): "AST", "ALT", "ALKPHOS", "BILITOT", "PROT", "ALBUMIN" in the last 72 hours. No results for input(s): "LIPASE", "AMYLASE" in the last 72 hours. Cardiac Enzymes No results for input(s): "CKTOTAL", "CKMB", "CKMBINDEX", "TROPONINI" in the last 72 hours.  BNP: BNP (last 3 results) Recent Labs    10/18/22 1821 10/25/22 1442 10/28/22 1825  BNP 1,494.7* 1,428.2* 491.6*    ProBNP (last 3 results) No results for input(s): "PROBNP" in the last 8760 hours.   D-Dimer No results for input(s): "DDIMER" in the last 72 hours. Hemoglobin A1C No results for input(s): "HGBA1C" in the last 72 hours. Fasting Lipid Panel No results for input(s): "CHOL", "HDL", "LDLCALC", "TRIG", "CHOLHDL", "LDLDIRECT" in the last 72 hours. Thyroid Function Tests No results for input(s): "TSH", "T4TOTAL", "T3FREE", "THYROIDAB" in the last 72 hours.  Invalid input(s): "FREET3"  Other results:   Imaging    No results found.   Medications:     Scheduled Medications:  aspirin  81 mg Oral Daily   atorvastatin  80 mg Oral Daily   Chlorhexidine Gluconate Cloth  6 each Topical Daily   dapagliflozin propanediol  10 mg Oral QAC breakfast   enoxaparin (LOVENOX) injection  60  mg Subcutaneous Q24H   furosemide  80 mg Intravenous BID   hydrALAZINE  25 mg Oral Q8H   insulin aspart  0-15 Units Subcutaneous TID WC   insulin glargine-yfgn  15 Units Subcutaneous Daily   isosorbide mononitrate  30 mg Oral Daily   potassium chloride  40 mEq Oral BID   sodium chloride flush  10-40 mL Intracatheter Q12H   sodium chloride flush  3 mL Intravenous Q12H    Infusions:  amiodarone 30 mg/hr (11/02/22 0802)   milrinone 0.125 mcg/kg/min (11/02/22 0429)    PRN Medications: acetaminophen **OR** acetaminophen, albuterol, ALPRAZolam,  guaiFENesin-dextromethorphan, melatonin, polyethylene glycol, sodium chloride flush    Patient Profile   Mr. Zeppieri is a 46 year old with a history of HTN, hyperlipidemia, smoker, cocaine abuse,NSVT,  NICM, Medtronic ICD, and chronic HFrEF. Last used cocaine about a week ago.  EF has been down for some time.    Admitted recently with A/C HFrEF. Suspect he had residual congestion when discharged.  Readmitted 12/11 with A/C HFrEF.  Assessment/Plan   1. A/C HFrEF  - NICM. Has Medtronic ICD. Had [place] earlier this year with nonobstructive CAD.   - Discharged last week and suspect he had residual congestion + increased fluid intake.  - Admitted with NYHA IV symptoms.  - TEE this admit EF 20%, severe central MR - Milrinone added 12/13 for low-output. Bump in Scr 12/17 after initiated milrinone wean +/- overdiuresis. - CVP 6 this am. Restart TO torsemide 100 mg qam/ 50 mg qpm   - Off carvedilol with low output - Holding entresto/spiro with creatinine close to 2.  - Continue farxiga - Continue hydral and imdur  - Currently not a candidate for home inotropes or advanced therapies given substance abuse and noncompliance. May be future candidate if he can refrain from further substance use. Will ask HF SW and VAD coordinators to see today  - w/ long runs of NSVT, may need LifeVest at d/c. Will follow response once off milrinone      2. AKI on CKD Stage IIIb - Creatinine on admit 2. Baseline previously ~ 1.6.  - Cr improving today, 1.89.   3. Substance Abuse - Last used cocaine about a week PTA. - Discussed cessation.  - not current candidate for advanced therapies   4. CAD - LHC 02/2022 nonobstructive CAD - LDL 124  - Increased atorvastatin to 80 mg daily    5. DMII -On SSI -Continue Farxiga   6. HTN  - BP improved   7. NSVT - Previously on amio but he stopped in April of this year.  I am not sure why he stopped.  - Frequent burst of NSVT noted on device since the beginning of  December.  - Now on IV amio d/t frequent runs NSVT.  - Continue IV amio while on inotrope support - Keep K > 4.0 Mg > 2.0  - Off Coreg with low-output - if continues to have NSVT off milrinone, may need LifeVest at d/c    8. Obesity  - Body mass index is 32.63 kg/m.   9. MR  - TEE 12/14: EF 20%, Severe central MR - May consider mTEER if HF not too advanced  SDOH: -Has medicaid -Works part-time at [employer].  -HF TOC CM following and assisting with housing. Lives with aunt -Will ask HF SW to see today   Length of Stay: [place], [place]  9:14 AM  Advanced Heart Failure Team Pager [phone] (M-F; 7a -  5p)  Please contact [facility] for night-coverage after hours (5p -7a ) and weekends on [email or web address]  Patient seen and examined with the above-signed Advanced Practice Provider and/or Housestaff. I personally reviewed laboratory data, imaging studies and relevant notes. I independently examined the patient and formulated the important aspects of the plan. I have edited the note to reflect any of my changes or salient points. I have personally discussed the plan with the patient and/or family.  Remains on milrinone 0.125. Co-ox 64% CVP down to 5-6 with IV diuretics. Still feels SOB with minimla activity. SCr slightly better.   General:  Sitting up  No resp difficulty HEENT: normal Neck: supple. JVP 5-6  Carotids 2+ bilat; no bruits. No lymphadenopathy or thryomegaly appreciated. Cor: PMI laterally displaced. Regular rate & rhythm. 2/6 MR Lungs: clear Abdomen: soft, nontender, nondistended. No hepatosplenomegaly. No bruits or masses. Good bowel sounds. Extremities: no cyanosis, clubbing, rash, edema Neuro: alert & orientedx3, cranial nerves grossly intact. moves all 4 extremities w/o difficulty. Affect pleasant   Volume status and co-ox much improved with milrinone. I worry he is now milrinone dependent. He clearly has end-stage HF and I worry this may be last window to  consider advanced therapies however this is complicated by ongoing drug use. Will ask VAD and SW teams to see today to discuss. Continue milrinone. Switch to po diuretics.   Glori Bickers, MD  1:59 PM

## 2022-11-02 NOTE — Progress Notes (Signed)
  Progress Note   Patient: Douglas [name] LKG:[id number] DOB: 16-Jan-1976 DOA: 10/25/2022     8 DOS: the patient was seen and examined on 11/02/2022        Brief hospital course: [name] is a 46 y.o. M with sCHF EF 20 s/p ICD, polysubstance abuse, HTN, obesity, and CAD who presente dwith progressive shortness of breath.       Assessment and Plan: * Acute on chronic systolic CHF (congestive heart failure) (HCC) Mitral regurgitation No weight change.  Net even. - Milrinone and diuretics per cardiology - Continue Farxiga, Imdur, hydralazine per Cardiology     NSVT (nonsustained ventricular tachycardia) (HCC) Some more brief runs VT.   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     Physical Exam: BP (!) 129/92   Pulse 78   Temp 98.4 F (36.9 C) (Oral)   Resp 18   Ht 6\' 4"  (1.93 m)   Wt 121.6 kg   SpO2 95%   BMI 32.63 kg/m   Adult male, sitting abdominal bed, eating breakfast RRR, soft systolic murmur, but cannot appreciate pitting edema Respiratory rate normal, lung sounds diminished, no rales or wheezes appreciated Attention normal, affect appropriate, judgment and insight appear normal    Data Reviewed: Telemetry shows brief episode of ventricular tachycardia Glucose normal Metabolic panel shows creatinine 1.9, no change from yesterday, potassium 3.9, magnesium 2.1    Family Communication: None present    Disposition: Status is: Inpatient Per cardiology        Author: , MD 11/02/2022 2:56 PM  For on call review [email or web address].

## 2022-11-02 NOTE — Progress Notes (Signed)
Patient had 22 beats of vtach, provider notified.

## 2022-11-03 ENCOUNTER — Inpatient Hospital Stay (HOSPITAL_COMMUNITY): Payer: Medicaid Other

## 2022-11-03 ENCOUNTER — Encounter (HOSPITAL_COMMUNITY): Payer: Medicaid Other

## 2022-11-03 DIAGNOSIS — I5023 Acute on chronic systolic (congestive) heart failure: Secondary | ICD-10-CM | POA: Diagnosis not present

## 2022-11-03 DIAGNOSIS — I10 Essential (primary) hypertension: Secondary | ICD-10-CM | POA: Diagnosis not present

## 2022-11-03 DIAGNOSIS — N179 Acute kidney failure, unspecified: Secondary | ICD-10-CM | POA: Diagnosis not present

## 2022-11-03 DIAGNOSIS — Z01810 Encounter for preprocedural cardiovascular examination: Secondary | ICD-10-CM

## 2022-11-03 DIAGNOSIS — F321 Major depressive disorder, single episode, moderate: Secondary | ICD-10-CM | POA: Diagnosis not present

## 2022-11-03 LAB — GLUCOSE, CAPILLARY
Glucose-Capillary: 137 mg/dL — ABNORMAL HIGH (ref 70–99)
Glucose-Capillary: 97 mg/dL (ref 70–99)
Glucose-Capillary: 124 mg/dL — ABNORMAL HIGH (ref 70–99)
Glucose-Capillary: 132 mg/dL — ABNORMAL HIGH (ref 70–99)

## 2022-11-03 LAB — COOXEMETRY PANEL
Carboxyhemoglobin: 1.5 % (ref 0.5–1.5)
Carboxyhemoglobin: 1.6 % — ABNORMAL HIGH (ref 0.5–1.5)
Methemoglobin: 0.7 % (ref 0.0–1.5)
Methemoglobin: 0.9 % (ref 0.0–1.5)
O2 Saturation: 50.3 %
O2 Saturation: 53.5 %
Total hemoglobin: 15 g/dL (ref 12.0–16.0)
Total hemoglobin: 14.9 g/dL (ref 12.0–16.0)

## 2022-11-03 LAB — BASIC METABOLIC PANEL
Anion gap: 12 (ref 5–15)
BUN: 29 mg/dL — ABNORMAL HIGH (ref 6–20)
CO2: 25 mmol/L (ref 22–32)
Calcium: 8.4 mg/dL — ABNORMAL LOW (ref 8.9–10.3)
Chloride: 99 mmol/L (ref 98–111)
Creatinine, Ser: 1.77 mg/dL — ABNORMAL HIGH (ref 0.61–1.24)
GFR, Estimated: 47 mL/min — ABNORMAL LOW (ref >60)
Glucose, Bld: 252 mg/dL — ABNORMAL HIGH (ref 70–99)
Potassium: 3.8 mmol/L (ref 3.5–5.1)
Sodium: 136 mmol/L (ref 135–145)

## 2022-11-03 LAB — HEMOGLOBIN A1C
Hgb A1c MFr Bld: 6.4 % — ABNORMAL HIGH (ref 4.8–5.6)
Mean Plasma Glucose: 137 mg/dL

## 2022-11-03 LAB — HEPATITIS B SURFACE ANTIBODY, QUANTITATIVE: Hep B S AB Quant (Post): 88 m[IU]/mL (ref >9.9)

## 2022-11-03 MED ORDER — TORSEMIDE 100 MG PO TABS
100.0000 mg | ORAL_TABLET | Freq: Two times a day (BID) | ORAL | Status: DC
  Administered 2022-11-03: 100 mg via ORAL
  Filled 2022-11-03: qty 1

## 2022-11-03 MED ORDER — POTASSIUM CHLORIDE CRYS ER 20 MEQ PO TBCR
20.0000 meq | EXTENDED_RELEASE_TABLET | Freq: Once | ORAL | Status: AC
  Administered 2022-11-03: 20 meq via ORAL
  Filled 2022-11-03: qty 1

## 2022-11-03 MED ORDER — SPIRONOLACTONE 12.5 MG HALF TABLET
12.5000 mg | ORAL_TABLET | Freq: Every day | ORAL | Status: DC
  Administered 2022-11-03 – 2022-11-08 (×6): 12.5 mg via ORAL
  Filled 2022-11-03 (×6): qty 1

## 2022-11-03 NOTE — Progress Notes (Signed)
PROGRESS NOTE    Douglas [name]  RRN:[id number] DOB: 09-16-1976 DOA: 10/25/2022 PCP: Claiborne Rigg, NP  [name] is a 46 y.o. M with sCHF EF 20% s/p ICD, polysubstance abuse, HTN, obesity, and CAD who presented with progressive shortness of breath.    12/11: Admitted on diuretics 12/12: Advanced HF team consulted 12/13: Milrinone started 12/20: CT surgery and LVAD SW consulted    Subjective:   Assessment and Plan:  Acute on chronic systolic CHF  Mitral regurgitation -ECHO 12/23 w/ EF of 20%, severe MR -HF team following, now Inotrope dependent - Milrinone and diuretics per cardiology - Beta-blocker held due to low output - GDMT limited by AKI/CKD - Continue Farxiga, Aldactone, Imdur, hydralazine  Acute renal failure superimposed on stage IIIb chronic kidney disease (HCC) Baseline creatinine 1.6, creatinine on admission 2, down to 1.8 today with diuresis  NSVT (nonsustained ventricular tachycardia) (HCC) - Continue amiodarone infusion - Continue magnesium and potassium supplements to keep above 2 and 4 respectively  Mitral regurgitation See above  Depression, major, single episode - Continue Xanax  Obesity BMI 32.5  Cocaine use Reports cessation  Essential hypertension Blood pressure controlled - Continue imdur, hydralazine  Hyperlipidemia - Continue atorvastatin  Diabetes mellitus with stage 2 chronic kidney disease (HCC) Glucose controlled - Continue glargine - Continue sliding scale corrections - Continue Farxiga - Continue aspirin and atorvastatin   DVT prophylaxis:lovenox Code Status: Full Code Family Communication: none present Disposition Plan:   Consultants: CHF team   Procedures:   Antimicrobials:    Objective: Vitals:   11/03/22 0553 11/03/22 0800 11/03/22 1137 11/03/22 1631  BP: 105/85 111/84 125/82 105/76  Pulse: 92 95 84 84  Resp: 18 17 17 17   Temp: 97.7 F (36.5 C) 98.7 F (37.1 C) 98.2 F (36.8 C) 97.8 F (36.6  C)  TempSrc: Oral Oral Oral Oral  SpO2: 93% 93% 95% 97%  Weight: 120.5 kg     Height:        Intake/Output Summary (Last 24 hours) at 11/03/2022 1712 Last data filed at 11/03/2022 1330 Gross per 24 hour  Intake 1200 ml  Output 1800 ml  Net -600 ml   Filed Weights   11/01/22 0116 11/02/22 0400 11/03/22 0553  Weight: 121.4 kg 121.6 kg 120.5 kg    Examination:     Data Reviewed:   CBC: Recent Labs  Lab 10/28/22 1824 11/02/22 1206  WBC 8.9 9.7  NEUTROABS  --  5.6  HGB 15.8 14.6  HCT 44.8 43.6  MCV 83.6 85.0  PLT 279 267   Basic Metabolic Panel: Recent Labs  Lab 10/30/22 0428 10/31/22 0655 11/01/22 0520 11/02/22 0420 11/02/22 1206 11/03/22 0500  NA 137 136 138 137 134* 136  K 3.4* 3.7 3.3* 3.9 4.0 3.8  CL 99 100 100 100 98 99  CO2 29 27 28 27 26 25   GLUCOSE 114* 129* 105* 123* 131* 252*  BUN 37* 36* 34* 32* 33* 29*  CREATININE 1.68* 2.04* 1.86* 1.89* 1.98* 1.77*  CALCIUM 8.4* 8.5* 8.6* 8.9 8.6* 8.4*  MG 2.0  --  2.1 2.1  --   --    GFR: Estimated Creatinine Clearance: 72.4 mL/min (A) (by C-G formula based on SCr of 1.77 mg/dL (H)). Liver Function Tests: Recent Labs  Lab 11/02/22 1206  AST 19  ALT 22  ALKPHOS 63  BILITOT 1.4*  PROT 6.8  ALBUMIN 2.7*   No results for input(s): "LIPASE", "AMYLASE" in the last 168 hours. No results for  input(s): "AMMONIA" in the last 168 hours. Coagulation Profile: Recent Labs  Lab 11/02/22 1206  INR 1.2   Cardiac Enzymes: No results for input(s): "CKTOTAL", "CKMB", "CKMBINDEX", "TROPONINI" in the last 168 hours. BNP (last 3 results) No results for input(s): "PROBNP" in the last 8760 hours. HbA1C: Recent Labs    11/02/22 1206  HGBA1C 6.4*   CBG: Recent Labs  Lab 11/02/22 1539 11/02/22 2127 11/03/22 0551 11/03/22 1139 11/03/22 1630  GLUCAP 120* 160* 137* 97 124*   Lipid Profile: No results for input(s): "CHOL", "HDL", "LDLCALC", "TRIG", "CHOLHDL", "LDLDIRECT" in the last 72 hours. Thyroid  Function Tests: Recent Labs    11/02/22 1206  TSH 2.959  FREET4 1.16*   Anemia Panel: No results for input(s): "VITAMINB12", "FOLATE", "FERRITIN", "TIBC", "IRON", "RETICCTPCT" in the last 72 hours. Urine analysis:    Component Value Date/Time   COLORURINE YELLOW 11/02/2022 1450   APPEARANCEUR CLEAR 11/02/2022 1450   LABSPEC 1.009 11/02/2022 1450   PHURINE 6.0 11/02/2022 1450   GLUCOSEU >=500 (A) 11/02/2022 1450   HGBUR NEGATIVE 11/02/2022 1450   BILIRUBINUR NEGATIVE 11/02/2022 1450   KETONESUR NEGATIVE 11/02/2022 1450   PROTEINUR 100 (A) 11/02/2022 1450   UROBILINOGEN 0.2 06/26/2021 1229   NITRITE NEGATIVE 11/02/2022 1450   LEUKOCYTESUR NEGATIVE 11/02/2022 1450   Sepsis Labs: @LABRCNTIP (procalcitonin:4,lacticidven:4)  ) Recent Results (from the past 240 hour(s))  Respiratory (~20 pathogens) panel by PCR     Status: None   Collection Time: 10/26/22  5:43 PM   Specimen: Nasopharyngeal Swab; Respiratory  Result Value Ref Range Status   Adenovirus NOT DETECTED NOT DETECTED Final   Coronavirus 229E NOT DETECTED NOT DETECTED Final    Comment: (NOTE) The Coronavirus on the Respiratory Panel, DOES NOT test for the novel  Coronavirus (2019 nCoV)    Coronavirus HKU1 NOT DETECTED NOT DETECTED Final   Coronavirus NL63 NOT DETECTED NOT DETECTED Final   Coronavirus OC43 NOT DETECTED NOT DETECTED Final   Metapneumovirus NOT DETECTED NOT DETECTED Final   Rhinovirus / Enterovirus NOT DETECTED NOT DETECTED Final   Influenza A NOT DETECTED NOT DETECTED Final   Influenza B NOT DETECTED NOT DETECTED Final   Parainfluenza Virus 1 NOT DETECTED NOT DETECTED Final   Parainfluenza Virus 2 NOT DETECTED NOT DETECTED Final   Parainfluenza Virus 3 NOT DETECTED NOT DETECTED Final   Parainfluenza Virus 4 NOT DETECTED NOT DETECTED Final   Respiratory Syncytial Virus NOT DETECTED NOT DETECTED Final   Bordetella pertussis NOT DETECTED NOT DETECTED Final   Bordetella Parapertussis NOT DETECTED NOT  DETECTED Final   Chlamydophila pneumoniae NOT DETECTED NOT DETECTED Final   Mycoplasma pneumoniae NOT DETECTED NOT DETECTED Final    Comment: Performed at [facility], [address], [place], [place] [address]     Radiology Studies: VAS US DOPPLER PRE VAD  Result Date: 11/03/2022 PERIOPERATIVE VASCULAR EVALUATION Patient Name:  HENRRY CHENAULT  Date of Exam:   11/03/2022 Medical Rec #: [id number]          Accession #:    [id number] Date of Birth: [birth year]         Patient Gender: M Patient Age:   89 years Exam Location:  [facility] Procedure:      VAS US DOPPLER PRE VAD Referring Phys: Reuel Boom BENSIMHON --------------------------------------------------------------------------------  Indications:  Pre-VAD workup. Risk Factors: Hypertension, hyperlipidemia, Diabetes, current smoker, coronary               artery disease. Performing Technologist: Ernestene Mention  RVT, RDMS Supporting Technologist: Jean Rosenthal RDMS, RVT  Examination Guidelines: A complete evaluation includes B-mode imaging, spectral Doppler, color Doppler, and power Doppler as needed of all accessible portions of each vessel. Bilateral testing is considered an integral part of a complete examination. Limited examinations for reoccurring indications may be performed as noted.  Right Carotid Findings: +----------+--------+--------+--------+--------+------------------+           PSV cm/sEDV cm/sStenosisDescribeComments           +----------+--------+--------+--------+--------+------------------+ CCA Prox  85      15                                         +----------+--------+--------+--------+--------+------------------+ CCA Distal70      27                      intimal thickening +----------+--------+--------+--------+--------+------------------+ ICA Prox  33      14                                         +----------+--------+--------+--------+--------+------------------+ ICA Distal56      18                                          +----------+--------+--------+--------+--------+------------------+ ECA       109     19                                         +----------+--------+--------+--------+--------+------------------+ +----------+--------+-------+----------------+------------+           PSV cm/sEDV cmsDescribe        Arm Pressure +----------+--------+-------+----------------+------------+ Subclavian102            Multiphasic, WNL             +----------+--------+-------+----------------+------------+ +---------+--------+--+--------+-+---------+ VertebralPSV cm/s25EDV cm/s8Antegrade +---------+--------+--+--------+-+---------+ Left Carotid Findings: +----------+--------+--------+--------+--------+------------------+           PSV cm/sEDV cm/sStenosisDescribeComments           +----------+--------+--------+--------+--------+------------------+ CCA Prox  103     17                                         +----------+--------+--------+--------+--------+------------------+ CCA Distal71      19                      intimal thickening +----------+--------+--------+--------+--------+------------------+ ICA Prox  55      19                                         +----------+--------+--------+--------+--------+------------------+ ICA Distal51      22                                         +----------+--------+--------+--------+--------+------------------+ ECA       84  11                                         +----------+--------+--------+--------+--------+------------------+ +----------+--------+--------+----------------+------------+ SubclavianPSV cm/sEDV cm/sDescribe        Arm Pressure +----------+--------+--------+----------------+------------+           106             Multiphasic, [id number]          +----------+--------+--------+----------------+------------+ +---------+--------+--+--------+--+---------+ VertebralPSV  cm/s27EDV cm/s11Antegrade +---------+--------+--+--------+--+---------+  ABI Findings: +---------+------------------+-----+---------+-----------------------------+ Right    Rt Pressure (mmHg)IndexWaveform Comment                       +---------+------------------+-----+---------+-----------------------------+ Brachial                        triphasicLine in right upper extremity +---------+------------------+-----+---------+-----------------------------+ PTA      151               1.11 triphasic                              +---------+------------------+-----+---------+-----------------------------+ DP       159               1.17 triphasic                              +---------+------------------+-----+---------+-----------------------------+ Great Toe                       Normal                                 +---------+------------------+-----+---------+-----------------------------+ +---------+------------------+-----+---------+-------+ Left     Lt Pressure (mmHg)IndexWaveform Comment +---------+------------------+-----+---------+-------+ Brachial 136                    triphasic        +---------+------------------+-----+---------+-------+ PTA      142               1.04 triphasic        +---------+------------------+-----+---------+-------+ DP       140               1.03 triphasic        +---------+------------------+-----+---------+-------+ [place] Shown                    Normal           +---------+------------------+-----+---------+-------+  Summary: Right Carotid: The extracranial vessels were near-normal with only minimal wall                thickening or plaque. Left Carotid: The extracranial vessels were near-normal with only minimal wall               thickening or plaque. Vertebrals:  Bilateral vertebral arteries demonstrate antegrade flow. Subclavians: Normal flow hemodynamics were seen in bilateral subclavian               arteries.  *See table(s) above for measurements and observations. Right ABI: Resting right ankle-brachial index is within normal range. The right toe-brachial index is normal. Left ABI: Resting left ankle-brachial index is within normal range. The left toe-brachial index is normal.  Electronically signed by Cristal Deer  Clark MD on 11/03/2022 at 4:30:11 PM.    Final    VAS [place] LOWER EXTREMITY VENOUS (DVT)  Result Date: 11/03/2022  Lower Venous DVT Study Patient Name:  Douglas [name]  Date of Exam:   11/03/2022 Medical Rec #: [id number]          Accession #:    [id number] Date of Birth: [birth year]         Patient Gender: M Patient Age:   21 years Exam Location:  [facility] Procedure:      VAS [place] LOWER EXTREMITY VENOUS (DVT) Referring Phys: Reuel Boom BENSIMHON --------------------------------------------------------------------------------  Indications: Pre-VAD workup.  Comparison Study: No prior studies. Performing Technologist: Jean Rosenthal RDMS, RVT  Examination Guidelines: A complete evaluation includes B-mode imaging, spectral Doppler, color Doppler, and power Doppler as needed of all accessible portions of each vessel. Bilateral testing is considered an integral part of a complete examination. Limited examinations for reoccurring indications may be performed as noted. The reflux portion of the exam is performed with the patient in reverse Trendelenburg.  +---------+---------------+---------+-----------+----------+--------------+ RIGHT    CompressibilityPhasicitySpontaneityPropertiesThrombus Aging +---------+---------------+---------+-----------+----------+--------------+ CFV      Full           Yes      Yes                                 +---------+---------------+---------+-----------+----------+--------------+ SFJ      Full                                                        +---------+---------------+---------+-----------+----------+--------------+ FV Prox  Full                                                         +---------+---------------+---------+-----------+----------+--------------+ FV Mid   Full                                                        +---------+---------------+---------+-----------+----------+--------------+ FV DistalFull                                                        +---------+---------------+---------+-----------+----------+--------------+ PFV      Full                                                        +---------+---------------+---------+-----------+----------+--------------+ POP      Full           Yes      Yes                                 +---------+---------------+---------+-----------+----------+--------------+  PTV      Full                                                        +---------+---------------+---------+-----------+----------+--------------+ PERO     Full                                                        +---------+---------------+---------+-----------+----------+--------------+   +---------+---------------+---------+-----------+----------+--------------+ LEFT     CompressibilityPhasicitySpontaneityPropertiesThrombus Aging +---------+---------------+---------+-----------+----------+--------------+ CFV      Full           Yes      Yes                                 +---------+---------------+---------+-----------+----------+--------------+ SFJ      Full                                                        +---------+---------------+---------+-----------+----------+--------------+ FV Prox  Full                                                        +---------+---------------+---------+-----------+----------+--------------+ FV Mid   Full                                                        +---------+---------------+---------+-----------+----------+--------------+ FV DistalFull                                                         +---------+---------------+---------+-----------+----------+--------------+ PFV      Full                                                        +---------+---------------+---------+-----------+----------+--------------+ POP      Full           Yes      Yes                                 +---------+---------------+---------+-----------+----------+--------------+ PTV      Full                                                        +---------+---------------+---------+-----------+----------+--------------+  PERO     Full                                                        +---------+---------------+---------+-----------+----------+--------------+     Summary: RIGHT: - There is no evidence of deep vein thrombosis in the lower extremity.  - No cystic structure found in the popliteal fossa.  LEFT: - There is no evidence of deep vein thrombosis in the lower extremity.  - No cystic structure found in the popliteal fossa.  *See table(s) above for measurements and observations. Electronically signed by Sherald Hess MD on 11/03/2022 at 4:28:34 PM.    Final    CT CHEST ABDOMEN PELVIS WO CONTRAST  Result Date: 11/02/2022 CLINICAL DATA:  LVAD evaluation; preop r/u for surgical contraindication EXAM: CT CHEST, ABDOMEN AND PELVIS WITHOUT CONTRAST TECHNIQUE: Multidetector CT imaging of the chest, abdomen and pelvis was performed following the standard protocol without IV contrast. RADIATION DOSE REDUCTION: This exam was performed according to the departmental dose-optimization program which includes automated exposure control, adjustment of the mA and/or kV according to patient size and/or use of iterative reconstruction technique. COMPARISON:  None Available. FINDINGS: CT CHEST FINDINGS Cardiovascular: Left chest wall cardiac defibrillator. Right PICC with tip terminating in the distal the superior vena cava. No significant pericardial effusion. The thoracic aorta is normal in caliber. No  atherosclerotic plaque of the thoracic aorta. No coronary artery calcifications. The main pulmonary artery is enlarged in caliber measuring up to 3.9 cm. Mediastinum/Nodes: Multiple prominent mediastinal lymph nodes as well as enlarged precarinal lymph node measuring 1.3 cm (3:25). Question right hilar lymphadenopathy (3:28), noting limited sensitivity for the detection of hilar adenopathy on this noncontrast study. No axillary lymph nodes. Thyroid gland, trachea, and esophagus demonstrate no significant findings. Lungs/Pleura: Patchy bilateral ground-glass airspace opacities most prominent within the right upper lobe measuring up to 1.2 x 1.2 cm (4:30). No pulmonary mass. No pleural effusion. No pneumothorax. Musculoskeletal: No chest wall abnormality. No suspicious lytic or blastic osseous lesions. No acute displaced fracture. Multilevel degenerative changes of the spine. CT ABDOMEN PELVIS FINDINGS Hepatobiliary: No focal liver abnormality. No gallstones, gallbladder wall thickening, or pericholecystic fluid. No biliary dilatation. Pancreas: No focal lesion. Normal pancreatic contour. No surrounding inflammatory changes. No main pancreatic ductal dilatation. Spleen: Normal in size without focal abnormality. Adrenals/Urinary Tract: No adrenal nodule bilaterally. No nephrolithiasis and no hydronephrosis. No definite contour-deforming renal mass. No ureterolithiasis or hydroureter. The urinary bladder is unremarkable. Stomach/Bowel: Stomach is within normal limits. No evidence of bowel wall thickening or dilatation. Appendix appears normal. Vascular/Lymphatic: No abdominal aorta or iliac aneurysm. No abdominal, pelvic, or inguinal lymphadenopathy. Reproductive: Prostate is unremarkable. Other: No intraperitoneal free fluid. No intraperitoneal free gas. No organized fluid collection. Musculoskeletal: Small fat containing umbilical hernia. Bilateral small inguinal hernias. Subcutaneus soft tissue edema along the left  anterior abdomen likely due to medication injection. No suspicious lytic or blastic osseous lesions. No acute displaced fracture. Multilevel degenerative changes of the spine. IMPRESSION: 1. Patchy bilateral ground-glass airspace opacities most prominent within the right upper lobe measuring up to 1.2 x 1.2 cm Findings suggestive of infection/inflammation. Recommend follow-up CT in 3 months to evaluate for resolution. 2. Multiple prominent mediastinal lymph nodes as well as enlarged precarinal lymph node measuring 1.3 cm. Question right hilar lymphadenopathy, noting limited sensitivity for  the detection of hilar adenopathy on this noncontrast study. Findings may be reactive in etiology. 3. Cardiomegaly. 4. Enlarged main pulmonary artery suggestive of pulmonary hypertension. 5. Small fat containing umbilical hernia. Bilateral small inguinal hernias. 6. No acute intra-abdominal or intrapelvic abnormality with limited evaluation on this noncontrast study. Electronically Signed   By: Tish Frederickson M.D.   On: 11/02/2022 21:35   DG Orthopantogram  Result Date: 11/02/2022 CLINICAL DATA:  Preop. EXAMTresa Endo COMPARISON:  January 24, 2013. FINDINGS: No fracture or lytic area is seen involving the mandible. Several missing teeth are noted. IMPRESSION: No significant abnormality seen involving the mandible. Electronically Signed   By: Lupita Raider M.D.   On: 11/02/2022 16:45     Scheduled Meds:  aspirin  81 mg Oral Daily   atorvastatin  80 mg Oral Daily   Chlorhexidine Gluconate Cloth  6 each Topical Daily   dapagliflozin propanediol  10 mg Oral QAC breakfast   enoxaparin (LOVENOX) injection  60 mg Subcutaneous Q24H   hydrALAZINE  25 mg Oral Q8H   insulin aspart  0-15 Units Subcutaneous TID WC   insulin glargine-yfgn  15 Units Subcutaneous Daily   isosorbide mononitrate  30 mg Oral Daily   potassium chloride  20 mEq Oral Once   sodium chloride flush  10-40 mL Intracatheter Q12H   sodium  chloride flush  3 mL Intravenous Q12H   spironolactone  12.5 mg Oral Daily   torsemide  100 mg Oral BID   Continuous Infusions:  amiodarone 30 mg/hr (11/03/22 0816)   milrinone 0.25 mcg/kg/min (11/03/22 1401)     LOS: 9 days    Time spent:    Zannie Cove, MD [facility]   11/03/2022, 5:12 PM

## 2022-11-03 NOTE — Progress Notes (Signed)
Nutrition Follow-up  DOCUMENTATION CODES:   Not applicable  INTERVENTION:  Double protein portions with meals Nightly snack to increase nutritional frequency MVI with minerals daily  NUTRITION DIAGNOSIS:   Increased nutrient needs related to acute illness, other (see comment) (nutritional optimization for LVAD evaluation) as evidenced by estimated needs.  GOAL:   Patient will meet greater than or equal to 90% of their needs  ongoing  MONITOR:   PO intake, Labs, Weight trends  REASON FOR ASSESSMENT:   Other (Comment) (LVAD eval)    ASSESSMENT:   46 y.o. male admits related to SOB. PMH includes: HLD, HTN, DM, CKD stage 2, CHF. Pt is currently receiving medical management related to acute on chronic systolic CHF.  Pt sitting on EOB at time of visit. He had just finished eating his lunch which included an egg salad sandwich, potato chips and a fresh fruit cup which he ate 100% of. He endorses having a good appetite. He is trying to season his food with hot sauce and seasoning packets.   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 He states that when he cooks, he tries not to use salt for seasoning. On the days he works, he will typically just graze all day on foods such as grilled chicken, occasionally has the fried chicken, green beans, pinto beans, etc. He often brings food home to eat as well. He enjoys eating fruits and vegetables. He states that even when he "does the bad stuff (drugs)" he still makes himself eat.   Meal completions: 12/18: 100% breakfast, 50% lunch, 40% dinner 12/19: 15% breakfast, 90% lunch, 90% dinner 12/20: 100% breakfast  Expressed the importance of maintaining adequate PO intake with emphasis on protein as well as a good balance of nutrition to optimize his nutritional status pending LVAD evaluation.  Pt reports that he has been on a diuretic for years. He recalls that about 1 year ago he weighed about 290 lbs and is now down  to about 260 lbs. Reviewed weight history. Within the last 6 months, his weight has been fluctuating up and down however overall appears to have been trending down. Uncertain how much of this weight loss is related to fluid status versus true dry weight loss.   Medications: farxiga, SSI 0-15 units TID, semglee 15 units daily, torsemide  Labs: BUN 29, Cr 1.77, GFR 47, CBG's 120-167 x24 hours  UOP: x24 hours +921ml x12 hours I/O's: -12.5L since admission  NUTRITION - FOCUSED PHYSICAL EXAM:  Flowsheet Row Most Recent Value  Orbital Region No depletion  Upper Arm Region No depletion  Thoracic and Lumbar Region No depletion  Buccal Region No depletion  Temple Region No depletion  Clavicle Bone Region No depletion  Clavicle and Acromion Bone Region No depletion  Scapular Bone Region No depletion  Dorsal Hand No depletion  Patellar Region No depletion  Anterior Thigh Region No depletion  Posterior Calf Region No depletion  Edema (RD Assessment) Mild  [BLE]  Hair Reviewed  Eyes Reviewed  Mouth Reviewed  Skin Reviewed  Nails Reviewed       Diet Order:   Diet Order             Diet Heart Room service appropriate? Yes; Fluid consistency: Thin  Diet effective now                   EDUCATION NEEDS:   Education needs have been addressed  Skin:  Skin Assessment: Reviewed  RN Assessment  Last BM:  12/18  Height:   Ht Readings from Last 1 Encounters:  10/26/22 6\' 4"  (1.93 m)    Weight:   Wt Readings from Last 1 Encounters:  11/03/22 120.5 kg    Ideal Body Weight:  91.8 kg  BMI:  Body mass index is 32.34 kg/m.  Estimated Nutritional Needs:   Kcal:  2400-2600  Protein:  120-135g  Fluid:  >/=2L  11/05/22, RDN, LDN Clinical Nutrition

## 2022-11-03 NOTE — Progress Notes (Signed)
CSW met briefly at bedside with patient to discuss VAD assessment. Patient agreeable to meet tomorrow at bedside. Patient gave permission for CSW to contact his sister to discuss implant and assessment.  CSW contacted patient's sister who states she is working tomorrow but could be available by phone to discuss LVAD assessment and caregiver role. CSW will contact her by phone to complete assessment. Jackie Brennan, LCSW, CCSW-MCS [phone]  

## 2022-11-03 NOTE — Progress Notes (Signed)
  Progress Note   Patient: Douglas [name] ZGY:[id number] DOB: 1976-04-27 DOA: 10/25/2022     9 DOS: the patient was seen and examined on 11/03/2022        Brief hospital course: Douglas [name] is a 46 y.o. M with sCHF EF 20% s/p ICD, polysubstance abuse, HTN, obesity, and CAD who presented with progressive shortness of breath.     12/11: Admitted on diuretics 12/12: Advanced HF team consulted 12/13: Milrinone started 12/20: CT surgery and LVAD SW consulted      Assessment and Plan: * Acute on chronic systolic CHF (congestive heart failure) (HCC) Mitral regurgitation NSVT (nonsustained ventricular tachycardia) Essential hypertension Weight down 1.1 kg, net -670 cc. Creatinine 1.77, slightly improved. -Amiodarone, milrinone, torsemide per advanced cardiology - Spironolactone, Imdur, hydralazine, Farxiga, Lipitor, aspirin per cardiology   Mood disorder Probably situational - Xanax low-dose okay - Low threshold to start sertraline  Diabetes mellitus with stage 2 chronic kidney disease (HCC) Glucose controlled - Continue RT - Continue sliding scale corrections, procedure  Abnormal CT chest CT chest abdomen pelvis performed yesterday for elevated workup showed some small nodular groundglass opacities in the right upper lobe.  I do not believe these are infectious, agree with repeat imaging in 3 to 6 months for follow-up  Obesity BMI 32       Subjective: Anxious, no more chest pain.  No fever cough, dyspnea.     Physical Exam: BP 125/82 (BP Location: Left Arm)   Pulse 84   Temp 98.2 F (36.8 C) (Oral)   Resp 17   Ht 6\' 4"  (1.93 m)   Wt 120.5 kg   SpO2 95%   BMI 32.34 kg/m   Male, lying in bed, tired. RRR, I do not appreciate a murmur today, no pitting edema Respiratory rate seems normal, lung sounds diminished but no rales or wheezing appreciated Attention normal, affect blunted, judgment insight appear normal, moves all extremities with normal strength  and coordination, speech fluent, face symmetric    Data Reviewed: Glucose normal Patient metabolic panel shows creatinine 1.7 CT report shows some inflammatory nodules in the upper lungs, unclear nature SCV O2 is 50.     Family Communication: None present    Disposition: Status is: Inpatient Per cardiology        Author: , MD 11/03/2022 3:14 PM  For on call review [email or web address].

## 2022-11-03 NOTE — Progress Notes (Addendum)
Patient seen and brief conversation had regarding LVAD candidacy  Full Consult to follow later today.   Clare Charon MD CV Surgery

## 2022-11-03 NOTE — Progress Notes (Addendum)
MCS EDUCATION NOTE:                Provided brief equipment overview and demonstration with HeartMate III training loop including discussion on the following:   a) mobile power unit b) system controller   c) universal Magazine features editor   d) battery clips   e) Batteries   f)  Perc lock   g) Percutaneous lead   Demonstrated and discussed:  a) changing power source on system controller from tethered (MPU) to untethered (battery) mode   b) changing power source on system controller from untethered (battery) to tethered (MPU) mode   c) how to monitor battery life both on the system controller and on each individual battery   d) changing batteries   Identified the following lifestyle modifications while living on MCS:    1. No driving for at least three months and then only if doctor gives permission to do so.   2. No tub baths while pump implanted, and shower only when doctor gives permission.   3. No swimming or submersion in water while implanted with pump.   4. No contact sports or engaging in jumping activities.   5. Always have a backup controller, charged spare batteries, and battery clips nearby at all times in case of emergency.   6. Call the doctor or hospital contact person if any change in how the pump sounds, feels, or works.   7. Plan to sleep only when connected to the power module.   8. Do not sleep on your stomach.   9. Keep a backup system controller, charged batteries, battery clips, and flashlight near you during sleep in case of electrical power outage.   10. Exit site care including dressing changes, monitoring for infection, and importance of keeping percutaneous lead stabilized at all times.     One of our current patients visited with them today to answer questions about living on and caring for someone on MCS.   All questions have been answered at this time and contact information was provided should he encounter any further questions.    Pts eval ongoing. Still  need PFTs, Palliative care team to see, pre vad dopplers, venous dopplers and social evaluation.   Pt will require PA for VAD implant with his insurance.   Total Session Time: 45 minutes  Carlton Adam, RN VAD Coordinator   Office: [phone] 24/7 VAD Pager: [phone]

## 2022-11-03 NOTE — Progress Notes (Signed)
Pre-VAD and lower extremity venous study completed.   Please see CV Proc for preliminary results.   Jean Rosenthal, RDMS, RVT

## 2022-11-03 NOTE — Progress Notes (Addendum)
Advanced Heart Failure Rounding Note  PCP-Cardiologist: None   Subjective:    12/13: Milrinone added for low-output, IV amio for frequent NSVT 12/16: Milrinone decreased to 0.125  Remains on Milrinone 0.125. Co-ox 50%. Recheck pending.  CVP 9. On Torsemide 100 mg q am and 50 mg q pm.   No dyspnea currently but notes cough and orthopnea last night.     Objective:   Weight Range: 120.5 kg Body mass index is 32.34 kg/m.   Vital Signs:   Temp:  [97.6 F (36.4 C)-98.7 F (37.1 C)] 98.7 F (37.1 C) (12/20 0800) Pulse Rate:  [76-95] 95 (12/20 0800) Resp:  [17-19] 17 (12/20 0800) BP: (105-129)/(69-92) 111/84 (12/20 0800) SpO2:  [93 %-96 %] 93 % (12/20 0800) Weight:  [120.5 kg] 120.5 kg (12/20 0553) Last BM Date : 11/01/22  Weight change: Filed Weights   11/01/22 0116 11/02/22 0400 11/03/22 0553  Weight: 121.4 kg 121.6 kg 120.5 kg    Intake/Output:   Intake/Output Summary (Last 24 hours) at 11/03/2022 0945 Last data filed at 11/03/2022 0841 Gross per 24 hour  Intake 1348.92 ml  Output 1900 ml  Net -551.08 ml     Physical Exam    CVP 9  General:  Sitting up in bed. No distress. HEENT: normal Neck: supple. Jvp 8-10 cm. Carotids 2+ bilat; no bruits.  Cor: PMI nondisplaced. Regular rate & rhythm. No rubs, gallops, 2/6 MR murmur Lungs: clear Abdomen: soft, nontender, nondistended.  Extremities: no cyanosis, clubbing, rash, trace edema, RUE PICC Neuro: alert & orientedx3, cranial nerves grossly intact. moves all 4 extremities w/o difficulty. Affect pleasant    Telemetry   NSR w/ less frequent PVCs and VT   Labs    CBC Recent Labs    11/02/22 1206  WBC 9.7  NEUTROABS 5.6  HGB 14.6  HCT 43.6  MCV 85.0  PLT 267   Basic Metabolic Panel Recent Labs    62/83/15 0520 11/02/22 0420 11/02/22 1206 11/03/22 0500  NA 138 137 134* 136  K 3.3* 3.9 4.0 3.8  CL 100 100 98 99  CO2 28 27 26 25   GLUCOSE 105* 123* 131* 252*  BUN 34* 32* 33* 29*   CREATININE 1.86* 1.89* 1.98* 1.77*  CALCIUM 8.6* 8.9 8.6* 8.4*  MG 2.1 2.1  --   --    Liver Function Tests Recent Labs    11/02/22 1206  AST 19  ALT 22  ALKPHOS 63  BILITOT 1.4*  PROT 6.8  ALBUMIN 2.7*   No results for input(s): "LIPASE", "AMYLASE" in the last 72 hours. Cardiac Enzymes No results for input(s): "CKTOTAL", "CKMB", "CKMBINDEX", "TROPONINI" in the last 72 hours.  BNP: BNP (last 3 results) Recent Labs    10/18/22 1821 10/25/22 1442 10/28/22 1825  BNP 1,494.7* 1,428.2* 491.6*    ProBNP (last 3 results) No results for input(s): "PROBNP" in the last 8760 hours.   D-Dimer No results for input(s): "DDIMER" in the last 72 hours. Hemoglobin A1C Recent Labs    11/02/22 1206  HGBA1C 6.4*   Fasting Lipid Panel No results for input(s): "CHOL", "HDL", "LDLCALC", "TRIG", "CHOLHDL", "LDLDIRECT" in the last 72 hours. Thyroid Function Tests Recent Labs    11/02/22 1206  TSH 2.959    Other results:   Imaging    CT CHEST ABDOMEN PELVIS WO CONTRAST  Result Date: 11/02/2022 CLINICAL DATA:  LVAD evaluation; preop r/u for surgical contraindication EXAM: CT CHEST, ABDOMEN AND PELVIS WITHOUT CONTRAST TECHNIQUE: Multidetector CT imaging of  the chest, abdomen and pelvis was performed following the standard protocol without IV contrast. RADIATION DOSE REDUCTION: This exam was performed according to the departmental dose-optimization program which includes automated exposure control, adjustment of the mA and/or kV according to patient size and/or use of iterative reconstruction technique. COMPARISON:  None Available. FINDINGS: CT CHEST FINDINGS Cardiovascular: Left chest wall cardiac defibrillator. Right PICC with tip terminating in the distal the superior vena cava. No significant pericardial effusion. The thoracic aorta is normal in caliber. No atherosclerotic plaque of the thoracic aorta. No coronary artery calcifications. The main pulmonary artery is enlarged in  caliber measuring up to 3.9 cm. Mediastinum/Nodes: Multiple prominent mediastinal lymph nodes as well as enlarged precarinal lymph node measuring 1.3 cm (3:25). Question right hilar lymphadenopathy (3:28), noting limited sensitivity for the detection of hilar adenopathy on this noncontrast study. No axillary lymph nodes. Thyroid gland, trachea, and esophagus demonstrate no significant findings. Lungs/Pleura: Patchy bilateral ground-glass airspace opacities most prominent within the right upper lobe measuring up to 1.2 x 1.2 cm (4:30). No pulmonary mass. No pleural effusion. No pneumothorax. Musculoskeletal: No chest wall abnormality. No suspicious lytic or blastic osseous lesions. No acute displaced fracture. Multilevel degenerative changes of the spine. CT ABDOMEN PELVIS FINDINGS Hepatobiliary: No focal liver abnormality. No gallstones, gallbladder wall thickening, or pericholecystic fluid. No biliary dilatation. Pancreas: No focal lesion. Normal pancreatic contour. No surrounding inflammatory changes. No main pancreatic ductal dilatation. Spleen: Normal in size without focal abnormality. Adrenals/Urinary Tract: No adrenal nodule bilaterally. No nephrolithiasis and no hydronephrosis. No definite contour-deforming renal mass. No ureterolithiasis or hydroureter. The urinary bladder is unremarkable. Stomach/Bowel: Stomach is within normal limits. No evidence of bowel wall thickening or dilatation. Appendix appears normal. Vascular/Lymphatic: No abdominal aorta or iliac aneurysm. No abdominal, pelvic, or inguinal lymphadenopathy. Reproductive: Prostate is unremarkable. Other: No intraperitoneal free fluid. No intraperitoneal free gas. No organized fluid collection. Musculoskeletal: Small fat containing umbilical hernia. Bilateral small inguinal hernias. Subcutaneus soft tissue edema along the left anterior abdomen likely due to medication injection. No suspicious lytic or blastic osseous lesions. No acute displaced  fracture. Multilevel degenerative changes of the spine. IMPRESSION: 1. Patchy bilateral ground-glass airspace opacities most prominent within the right upper lobe measuring up to 1.2 x 1.2 cm Findings suggestive of infection/inflammation. Recommend follow-up CT in 3 months to evaluate for resolution. 2. Multiple prominent mediastinal lymph nodes as well as enlarged precarinal lymph node measuring 1.3 cm. Question right hilar lymphadenopathy, noting limited sensitivity for the detection of hilar adenopathy on this noncontrast study. Findings may be reactive in etiology. 3. Cardiomegaly. 4. Enlarged main pulmonary artery suggestive of pulmonary hypertension. 5. Small fat containing umbilical hernia. Bilateral small inguinal hernias. 6. No acute intra-abdominal or intrapelvic abnormality with limited evaluation on this noncontrast study. Electronically Signed   By: Tish Frederickson M.D.   On: 11/02/2022 21:35   DG Orthopantogram  Result Date: 11/02/2022 CLINICAL DATA:  Preop. EXAMTresa [name] COMPARISON:  January 24, 2013. FINDINGS: No fracture or lytic area is seen involving the mandible. Several missing teeth are noted. IMPRESSION: No significant abnormality seen involving the mandible. Electronically Signed   By: Lupita Raider M.D.   On: 11/02/2022 16:45     Medications:     Scheduled Medications:  aspirin  81 mg Oral Daily   atorvastatin  80 mg Oral Daily   Chlorhexidine Gluconate Cloth  6 each Topical Daily   dapagliflozin propanediol  10 mg Oral QAC breakfast   enoxaparin (LOVENOX) injection  60  mg Subcutaneous Q24H   hydrALAZINE  25 mg Oral Q8H   insulin aspart  0-15 Units Subcutaneous TID WC   insulin glargine-yfgn  15 Units Subcutaneous Daily   isosorbide mononitrate  30 mg Oral Daily   sodium chloride flush  10-40 mL Intracatheter Q12H   sodium chloride flush  3 mL Intravenous Q12H   torsemide  100 mg Oral Daily   torsemide  50 mg Oral q1800    Infusions:  amiodarone  30 mg/hr (11/03/22 0816)   milrinone 0.125 mcg/kg/min (11/02/22 0429)    PRN Medications: acetaminophen **OR** acetaminophen, albuterol, ALPRAZolam, guaiFENesin-dextromethorphan, melatonin, polyethylene glycol, sodium chloride flush    Patient Profile   Douglas [name] is a 46 year old with a history of HTN, hyperlipidemia, smoker, cocaine abuse,NSVT,  NICM, Medtronic ICD, and chronic HFrEF. Last used cocaine about a week ago.  EF has been down for some time.    Admitted recently with A/C HFrEF. Suspect he had residual congestion when discharged.  Readmitted 12/11 with A/C HFrEF.  Assessment/Plan   1. A/C HFrEF  - NICM. Has Medtronic ICD. 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   - Discharged last week and suspect he had residual congestion + increased fluid intake.  - Admitted with NYHA IV symptoms.  - TEE this admit EF 20%, severe central MR - Milrinone added 12/13 for low-output. Bump in Scr 12/17 after initiated milrinone wean +/- overdiuresis. - CVP 9. Still having some orthopnea and cough. Currently on Torsemide 100 q am and 50 q pm, increase to 100 BID - Add back spiro at 12.5 mg daily - Off carvedilol with low output - Continue farxiga - Continue hydral and imdur  - Has end-stage HF and there is growing concern that he is inotrope dependent. This may be the last opportunity to consider advanced therapies including VAD. However, this is complicated by ongoing drug use and difficult social situation. He has been meeting with VAD team and SW to further discuss.   2. AKI on CKD Stage IIIb - Creatinine on admit 2. Baseline previously ~ 1.6.  - Cr improving today, 1.77   3. Substance Abuse - Last used cocaine about a week PTA. - Discussed cessation.  - reports he is motivated to quit so he can qualify for VAD  4. CAD - LHC 02/2022 nonobstructive CAD - LDL 124  - Increased atorvastatin to 80 mg daily    5. DMII -On SSI -Continue Farxiga   6. HTN  - BP  improved   7. NSVT - Previously on amio but he stopped in April of this year.  I am not sure why he stopped.  - Frequent burst of NSVT noted on device since the beginning of December.  - Now on IV amio d/t frequent runs NSVT.  - Continue IV amio while on inotrope support, seems to be having less VT today - Keep K > 4.0 Mg > 2.0  - Off Coreg with low-output   8. Obesity  - Body mass index is 32.34 kg/m.   9. MR  - TEE 12/14: EF 20%, Severe central MR - HF may be too advanced to benefit from mTEER   SDOH: -Has medicaid -Works part-time at [employer].  -SW and VAD team following to see if he would be VAD candidate. Sister would be his caregiver. -Currently living with Aunt and sleeps on the floor. Would need more stable living situation prior to VAD.  ADDENDUM: -Repeat CO-OX 53% -Will increase milrinone to  0.25 to support CO and renal perfusion.  -Depending on CVP tomorrow will switch PO torsemide to IV lasix. -Continue VAD workup. Will need RHC at some point.   Length of Stay: 9  FINCH, LINDSAY N, PA-C  9:45 AM  Advanced Heart Failure Team Pager [phone] (M-F; 7a - 5p)  Please contact [facility] for night-coverage after hours (5p -7a ) and weekends on [email or web address]   Patient seen and examined with the above-signed Advanced Practice Provider and/or Housestaff. I personally reviewed laboratory data, imaging studies and relevant notes. I independently examined the patient and formulated the important aspects of the plan. I have edited the note to reflect any of my changes or salient points. I have personally discussed the plan with the patient and/or family.  Co-ox low despite milrinone. Dyspneic with minimal exertion. Scr stable at 1.77  General:  Sitting up in bed . No resp difficulty HEENT: normal Neck: supple. JVP 7-8. Carotids 2+ bilat; no bruits. No lymphadenopathy or thryomegaly appreciated. Cor: PMI nondisplaced. Regular rate & rhythm. 2/6 MR Lungs:  clear Abdomen: soft, nontender, nondistended. No hepatosplenomegaly. No bruits or masses. Good bowel sounds. Extremities: no cyanosis, clubbing, rash, edema Neuro: alert & orientedx3, cranial nerves grossly intact. moves all 4 extremities w/o difficulty. Affect pleasant  He is now inotrope dependent. Increase milrinone to 0.25. Long talk with him, his family and VAD team about need for mechanical support (likely this admission) if he qualifies.   Await surgical consult. Plan RHC prior.   [name], MD  3:02 PM

## 2022-11-03 NOTE — Progress Notes (Signed)
Came to offer assist with amb but pt declined. States "he didn't get much sleep" and "he's not in the mood to walk right now but could walk later". I informed the pt about the importance of building up strength before a potential surgery. Reports he walked yesterday and felt fine. I encouraged him to amb at least 3x/day. Will try to f/u later to assist with amb.    Faustino Congress 11/03/2022 9:20 AM

## 2022-11-04 ENCOUNTER — Inpatient Hospital Stay (HOSPITAL_COMMUNITY): Payer: Medicaid Other

## 2022-11-04 ENCOUNTER — Other Ambulatory Visit: Payer: Self-pay

## 2022-11-04 DIAGNOSIS — N179 Acute kidney failure, unspecified: Secondary | ICD-10-CM | POA: Diagnosis not present

## 2022-11-04 DIAGNOSIS — Z515 Encounter for palliative care: Secondary | ICD-10-CM | POA: Diagnosis not present

## 2022-11-04 DIAGNOSIS — N182 Chronic kidney disease, stage 2 (mild): Secondary | ICD-10-CM | POA: Diagnosis not present

## 2022-11-04 DIAGNOSIS — I509 Heart failure, unspecified: Secondary | ICD-10-CM | POA: Diagnosis not present

## 2022-11-04 DIAGNOSIS — I5023 Acute on chronic systolic (congestive) heart failure: Secondary | ICD-10-CM | POA: Diagnosis not present

## 2022-11-04 LAB — PULMONARY FUNCTION TEST
DL/VA % pred: 88 %
DL/VA: 3.88 ml/min/mmHg/L
DLCO cor % pred: 41 %
DLCO cor: 14.6 ml/min/mmHg
DLCO unc % pred: 40 %
DLCO unc: 14.52 ml/min/mmHg
FEF 25-75 Pre: 2.91 L/sec
FEF2575-%Pred-Pre: 69 %
FEV1-%Pred-Pre: 50 %
FEV1-Pre: 2.41 L
FEV1FVC-%Pred-Pre: 106 %
FEV6-%Pred-Pre: 47 %
FEV6-Pre: 2.81 L
FEV6FVC-%Pred-Pre: 103 %
FVC-%Pred-Pre: 46 %
FVC-Pre: 2.88 L
Pre FEV1/FVC ratio: 84 %
Pre FEV6/FVC Ratio: 100 %
RV % pred: 96 %
RV: 2.24 L
TLC % pred: 66 %
TLC: 5.39 L

## 2022-11-04 LAB — CBC
HCT: 42.2 % (ref 39.0–52.0)
Hemoglobin: 14.4 g/dL (ref 13.0–17.0)
MCH: 28.9 pg (ref 26.0–34.0)
MCHC: 34.1 g/dL (ref 30.0–36.0)
MCV: 84.6 fL (ref 80.0–100.0)
Platelets: 249 10*3/uL (ref 150–400)
RBC: 4.99 MIL/uL (ref 4.22–5.81)
RDW: 13.1 % (ref 11.5–15.5)
WBC: 7.6 10*3/uL (ref 4.0–10.5)
nRBC: 0 % (ref 0.0–0.2)

## 2022-11-04 LAB — BASIC METABOLIC PANEL
Anion gap: 10 (ref 5–15)
Anion gap: 8 (ref 5–15)
BUN: 30 mg/dL — ABNORMAL HIGH (ref 6–20)
BUN: 34 mg/dL — ABNORMAL HIGH (ref 6–20)
CO2: 26 mmol/L (ref 22–32)
CO2: 26 mmol/L (ref 22–32)
Calcium: 8.7 mg/dL — ABNORMAL LOW (ref 8.9–10.3)
Calcium: 8.7 mg/dL — ABNORMAL LOW (ref 8.9–10.3)
Chloride: 101 mmol/L (ref 98–111)
Chloride: 99 mmol/L (ref 98–111)
Creatinine, Ser: 1.99 mg/dL — ABNORMAL HIGH (ref 0.61–1.24)
Creatinine, Ser: 2.1 mg/dL — ABNORMAL HIGH (ref 0.61–1.24)
GFR, Estimated: 41 mL/min — ABNORMAL LOW (ref >60)
GFR, Estimated: 38 mL/min — ABNORMAL LOW (ref >60)
Glucose, Bld: 104 mg/dL — ABNORMAL HIGH (ref 70–99)
Glucose, Bld: 162 mg/dL — ABNORMAL HIGH (ref 70–99)
Potassium: 4 mmol/L (ref 3.5–5.1)
Potassium: 3.6 mmol/L (ref 3.5–5.1)
Sodium: 137 mmol/L (ref 135–145)
Sodium: 133 mmol/L — ABNORMAL LOW (ref 135–145)

## 2022-11-04 LAB — COOXEMETRY PANEL
Carboxyhemoglobin: 2 % — ABNORMAL HIGH (ref 0.5–1.5)
Methemoglobin: 0.8 % (ref 0.0–1.5)
O2 Saturation: 60.2 %
Total hemoglobin: 14.7 g/dL (ref 12.0–16.0)

## 2022-11-04 LAB — GLUCOSE, CAPILLARY
Glucose-Capillary: 138 mg/dL — ABNORMAL HIGH (ref 70–99)
Glucose-Capillary: 166 mg/dL — ABNORMAL HIGH (ref 70–99)
Glucose-Capillary: 118 mg/dL — ABNORMAL HIGH (ref 70–99)
Glucose-Capillary: 136 mg/dL — ABNORMAL HIGH (ref 70–99)
Glucose-Capillary: 158 mg/dL — ABNORMAL HIGH (ref 70–99)

## 2022-11-04 LAB — LUPUS ANTICOAGULANT PANEL
DRVVT: 41.8 s (ref 0.0–47.0)
PTT Lupus Anticoagulant: 34.9 s (ref 0.0–43.5)

## 2022-11-04 MED ORDER — SODIUM CHLORIDE 0.9% FLUSH: 3.0000 mL | Freq: Two times a day (BID) | INTRAVENOUS | Status: DC

## 2022-11-04 MED ORDER — POTASSIUM CHLORIDE CRYS ER 20 MEQ PO TBCR
40.0000 meq | EXTENDED_RELEASE_TABLET | Freq: Once | ORAL | Status: AC
  Administered 2022-11-04: 40 meq via ORAL
  Filled 2022-11-04: qty 2

## 2022-11-04 MED ORDER — SODIUM CHLORIDE 0.9 % IV SOLN: 250.0000 mL | INTRAVENOUS | Status: DC | PRN

## 2022-11-04 MED ORDER — POTASSIUM CHLORIDE CRYS ER 20 MEQ PO TBCR
20.0000 meq | EXTENDED_RELEASE_TABLET | Freq: Once | ORAL | Status: AC
  Administered 2022-11-04: 20 meq via ORAL
  Filled 2022-11-04: qty 1

## 2022-11-04 MED ORDER — ALBUTEROL SULFATE (2.5 MG/3ML) 0.083% IN NEBU: 2.5000 mg | INHALATION_SOLUTION | Freq: Once | RESPIRATORY_TRACT | Status: DC

## 2022-11-04 MED ORDER — SODIUM CHLORIDE 0.9 % IV SOLN: INTRAVENOUS | Status: DC

## 2022-11-04 MED ORDER — FUROSEMIDE 10 MG/ML IJ SOLN
80.0000 mg | Freq: Two times a day (BID) | INTRAMUSCULAR | Status: AC
  Administered 2022-11-04 (×2): 80 mg via INTRAVENOUS
  Filled 2022-11-04 (×2): qty 8

## 2022-11-04 MED ORDER — SODIUM CHLORIDE 0.9% FLUSH: 3.0000 mL | INTRAVENOUS | Status: DC | PRN

## 2022-11-04 NOTE — Progress Notes (Addendum)
Advanced Heart Failure Rounding Note  PCP-Cardiologist: None   Subjective:    Undergoing workup to see if he qualifies for VAD.  CO-OX 60% on milrinone 0.25.  CVP 9-10  Scr up slightly 1.77>>1.99. 2500 cc UOP charted yesterday with 100 mg Torsemide BID. Weight up 1 lb.  Dyspnea continues to improve. Still having intermittent cough especially with lying flat.   Objective:   Weight Range: 121.1 kg Body mass index is 32.49 kg/m.   Vital Signs:   Temp:  [97.8 F (36.6 C)-98.7 F (37.1 C)] 97.8 F (36.6 C) (12/21 0603) Pulse Rate:  [84-95] 91 (12/21 0603) Resp:  [17-18] 18 (12/21 0603) BP: (105-125)/(76-85) 105/85 (12/21 0603) SpO2:  [90 %-97 %] 90 % (12/21 0603) Weight:  [121.1 kg] 121.1 kg (12/21 0603) Last BM Date : 11/03/22  Weight change: Filed Weights   11/02/22 0400 11/03/22 0553 11/04/22 0603  Weight: 121.6 kg 120.5 kg 121.1 kg    Intake/Output:   Intake/Output Summary (Last 24 hours) at 11/04/2022 0708 Last data filed at 11/04/2022 0630 Gross per 24 hour  Intake 1624.93 ml  Output 2500 ml  Net -875.07 ml     Physical Exam    CVP 9-10 General:  Sitting up on side of bed. No distress. HEENT: normal Neck: supple. JVP ~ 10 cm. Carotids 2+ bilat; no bruits.  Cor: PMI nondisplaced. Regular rate & rhythm. No rubs, gallops, + 3/6 MR murmur Lungs: clear Abdomen: soft, nontender, nondistended.  Extremities: no cyanosis, clubbing, rash, trace edema Neuro: alert & orientedx3, cranial nerves grossly intact. moves all 4 extremities w/o difficulty. Affect pleasant     Telemetry    SR 80s-90s, ~ 5 PVCs min   Labs    CBC Recent Labs    11/02/22 1206 11/04/22 0330  WBC 9.7 7.6  NEUTROABS 5.6  --   HGB 14.6 14.4  HCT 43.6 42.2  MCV 85.0 84.6  PLT 267 249   Basic Metabolic Panel Recent Labs    40/81/44 0420 11/02/22 1206 11/03/22 0500 11/04/22 0330  NA 137   < > 136 137  K 3.9   < > 3.8 4.0  CL 100   < > 99 101  CO2 27   < > 25 26   GLUCOSE 123*   < > 252* 104*  BUN 32*   < > 29* 30*  CREATININE 1.89*   < > 1.77* 1.99*  CALCIUM 8.9   < > 8.4* 8.7*  MG 2.1  --   --   --    < > = values in this interval not displayed.   Liver Function Tests Recent Labs    11/02/22 1206  AST 19  ALT 22  ALKPHOS 63  BILITOT 1.4*  PROT 6.8  ALBUMIN 2.7*   No results for input(s): "LIPASE", "AMYLASE" in the last 72 hours. Cardiac Enzymes No results for input(s): "CKTOTAL", "CKMB", "CKMBINDEX", "TROPONINI" in the last 72 hours.  BNP: BNP (last 3 results) Recent Labs    10/18/22 1821 10/25/22 1442 10/28/22 1825  BNP 1,494.7* 1,428.2* 491.6*    ProBNP (last 3 results) No results for input(s): "PROBNP" in the last 8760 hours.   D-Dimer No results for input(s): "DDIMER" in the last 72 hours. Hemoglobin A1C Recent Labs    11/02/22 1206  HGBA1C 6.4*   Fasting Lipid Panel No results for input(s): "CHOL", "HDL", "LDLCALC", "TRIG", "CHOLHDL", "LDLDIRECT" in the last 72 hours. Thyroid Function Tests Recent Labs    11/02/22 1206  TSH 2.959    Other results:   Imaging    VAS US DOPPLER PRE VAD  Result Date: 11/03/2022 PERIOPERATIVE VASCULAR EVALUATION Patient Name:  Douglas [name]  Date of Exam:   11/03/2022 Medical Rec #: [id number]          Accession #:    [id number] Date of Birth: 12-Dec-1975         Patient Gender: M Patient Age:   46 years Exam Location:  [facility] Procedure:      VAS US DOPPLER PRE VAD Referring Phys: Quillian Quince Calista Crain --------------------------------------------------------------------------------  Indications:  Pre-VAD workup. Risk Factors: Hypertension, hyperlipidemia, Diabetes, current smoker, coronary               artery disease. Performing Technologist: Rogelia Rohrer RVT, RDMS Supporting Technologist: Darlin Coco RDMS, RVT  Examination Guidelines: A complete evaluation includes B-mode imaging, spectral Doppler, color Doppler, and power Doppler as needed of all accessible portions  of each vessel. Bilateral testing is considered an integral part of a complete examination. Limited examinations for reoccurring indications may be performed as noted.  Right Carotid Findings: +----------+--------+--------+--------+--------+------------------+           PSV cm/sEDV cm/sStenosisDescribeComments           +----------+--------+--------+--------+--------+------------------+ CCA Prox  85      15                                         +----------+--------+--------+--------+--------+------------------+ CCA Distal70      27                      intimal thickening +----------+--------+--------+--------+--------+------------------+ ICA Prox  33      14                                         +----------+--------+--------+--------+--------+------------------+ ICA Distal56      18                                         +----------+--------+--------+--------+--------+------------------+ ECA       109     19                                         +----------+--------+--------+--------+--------+------------------+ +----------+--------+-------+----------------+------------+           PSV cm/sEDV cmsDescribe        Arm Pressure +----------+--------+-------+----------------+------------+ Subclavian102            Multiphasic, WNL             +----------+--------+-------+----------------+------------+ +---------+--------+--+--------+-+---------+ VertebralPSV cm/s25EDV cm/s8Antegrade +---------+--------+--+--------+-+---------+ Left Carotid Findings: +----------+--------+--------+--------+--------+------------------+           PSV cm/sEDV cm/sStenosisDescribeComments           +----------+--------+--------+--------+--------+------------------+ CCA Prox  103     17                                         +----------+--------+--------+--------+--------+------------------+ CCA Distal71      19  intimal thickening  +----------+--------+--------+--------+--------+------------------+ ICA Prox  55      19                                         +----------+--------+--------+--------+--------+------------------+ ICA Distal51      22                                         +----------+--------+--------+--------+--------+------------------+ ECA       84      11                                         +----------+--------+--------+--------+--------+------------------+ +----------+--------+--------+----------------+------------+ SubclavianPSV cm/sEDV cm/sDescribe        Arm Pressure +----------+--------+--------+----------------+------------+           106             Multiphasic, [id number]          +----------+--------+--------+----------------+------------+ +---------+--------+--+--------+--+---------+ VertebralPSV cm/s27EDV cm/s11Antegrade +---------+--------+--+--------+--+---------+  ABI Findings: +---------+------------------+-----+---------+-----------------------------+ Right    Rt Pressure (mmHg)IndexWaveform Comment                       +---------+------------------+-----+---------+-----------------------------+ Brachial                        triphasicLine in right upper extremity +---------+------------------+-----+---------+-----------------------------+ PTA      151               1.11 triphasic                              +---------+------------------+-----+---------+-----------------------------+ DP       159               1.17 triphasic                              +---------+------------------+-----+---------+-----------------------------+ Great Toe                       Normal                                 +---------+------------------+-----+---------+-----------------------------+ +---------+------------------+-----+---------+-------+ Left     Lt Pressure (mmHg)IndexWaveform Comment +---------+------------------+-----+---------+-------+  Brachial 136                    triphasic        +---------+------------------+-----+---------+-------+ PTA      142               1.04 triphasic        +---------+------------------+-----+---------+-------+ DP       140               1.03 triphasic        +---------+------------------+-----+---------+-------+ Dorien Chihuahua                    Normal           +---------+------------------+-----+---------+-------+  Summary: Right Carotid: The extracranial vessels were near-normal with only minimal wall  thickening or plaque. Left Carotid: The extracranial vessels were near-normal with only minimal wall               thickening or plaque. Vertebrals:  Bilateral vertebral arteries demonstrate antegrade flow. Subclavians: Normal flow hemodynamics were seen in bilateral subclavian              arteries.  *See table(s) above for measurements and observations. Right ABI: Resting right ankle-brachial index is within normal range. The right toe-brachial index is normal. Left ABI: Resting left ankle-brachial index is within normal range. The left toe-brachial index is normal.  Electronically signed by Sherald Hess MD on 11/03/2022 at 4:30:11 PM.    Final    VAS [place] LOWER EXTREMITY VENOUS (DVT)  Result Date: 11/03/2022  Lower Venous DVT Study Patient Name:  DAMARIA STOFKO  Date of Exam:   11/03/2022 Medical Rec #: [id number]          Accession #:    [id number] Date of Birth: 1975-11-25         Patient Gender: M Patient Age:   10 years Exam Location:  [facility] Procedure:      VAS [place] LOWER EXTREMITY VENOUS (DVT) Referring Phys: Reuel Boom Walden Statz --------------------------------------------------------------------------------  Indications: Pre-VAD workup.  Comparison Study: No prior studies. Performing Technologist: Jean Rosenthal RDMS, RVT  Examination Guidelines: A complete evaluation includes B-mode imaging, spectral Doppler, color Doppler, and power Doppler as needed of  all accessible portions of each vessel. Bilateral testing is considered an integral part of a complete examination. Limited examinations for reoccurring indications may be performed as noted. The reflux portion of the exam is performed with the patient in reverse Trendelenburg.  +---------+---------------+---------+-----------+----------+--------------+ RIGHT    CompressibilityPhasicitySpontaneityPropertiesThrombus Aging +---------+---------------+---------+-----------+----------+--------------+ CFV      Full           Yes      Yes                                 +---------+---------------+---------+-----------+----------+--------------+ SFJ      Full                                                        +---------+---------------+---------+-----------+----------+--------------+ FV Prox  Full                                                        +---------+---------------+---------+-----------+----------+--------------+ FV Mid   Full                                                        +---------+---------------+---------+-----------+----------+--------------+ FV DistalFull                                                        +---------+---------------+---------+-----------+----------+--------------+ PFV  Full                                                        +---------+---------------+---------+-----------+----------+--------------+ POP      Full           Yes      Yes                                 +---------+---------------+---------+-----------+----------+--------------+ PTV      Full                                                        +---------+---------------+---------+-----------+----------+--------------+ PERO     Full                                                        +---------+---------------+---------+-----------+----------+--------------+    +---------+---------------+---------+-----------+----------+--------------+ LEFT     CompressibilityPhasicitySpontaneityPropertiesThrombus Aging +---------+---------------+---------+-----------+----------+--------------+ CFV      Full           Yes      Yes                                 +---------+---------------+---------+-----------+----------+--------------+ SFJ      Full                                                        +---------+---------------+---------+-----------+----------+--------------+ FV Prox  Full                                                        +---------+---------------+---------+-----------+----------+--------------+ FV Mid   Full                                                        +---------+---------------+---------+-----------+----------+--------------+ FV DistalFull                                                        +---------+---------------+---------+-----------+----------+--------------+ PFV      Full                                                        +---------+---------------+---------+-----------+----------+--------------+  POP      Full           Yes      Yes                                 +---------+---------------+---------+-----------+----------+--------------+ PTV      Full                                                        +---------+---------------+---------+-----------+----------+--------------+ PERO     Full                                                        +---------+---------------+---------+-----------+----------+--------------+     Summary: RIGHT: - There is no evidence of deep vein thrombosis in the lower extremity.  - No cystic structure found in the popliteal fossa.  LEFT: - There is no evidence of deep vein thrombosis in the lower extremity.  - No cystic structure found in the popliteal fossa.  *See table(s) above for measurements and observations. Electronically signed  by Sherald Hess MD on 11/03/2022 at 4:28:34 PM.    Final      Medications:     Scheduled Medications:  aspirin  81 mg Oral Daily   atorvastatin  80 mg Oral Daily   Chlorhexidine Gluconate Cloth  6 each Topical Daily   dapagliflozin propanediol  10 mg Oral QAC breakfast   enoxaparin (LOVENOX) injection  60 mg Subcutaneous Q24H   hydrALAZINE  25 mg Oral Q8H   insulin aspart  0-15 Units Subcutaneous TID WC   insulin glargine-yfgn  15 Units Subcutaneous Daily   isosorbide mononitrate  30 mg Oral Daily   sodium chloride flush  10-40 mL Intracatheter Q12H   sodium chloride flush  3 mL Intravenous Q12H   spironolactone  12.5 mg Oral Daily   torsemide  100 mg Oral BID    Infusions:  amiodarone 30 mg/hr (11/04/22 0630)   milrinone 0.25 mcg/kg/min (11/04/22 0630)    PRN Medications: acetaminophen **OR** acetaminophen, albuterol, ALPRAZolam, guaiFENesin-dextromethorphan, melatonin, polyethylene glycol, sodium chloride flush    Patient Profile   Douglas [name] is a 46 year old with a history of HTN, hyperlipidemia, smoker, cocaine abuse,NSVT,  NICM, Medtronic ICD, and chronic HFrEF. Last used cocaine about a week ago.  EF has been down for some time.    Admitted recently with A/C HFrEF. Suspect he had residual congestion when discharged.  Readmitted 12/11 with A/C HFrEF.  Assessment/Plan   1. A/C HFrEF  - NICM. Has Medtronic ICD. 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   - Admit early December. Suspect still had volume on board at discharge + increased fluid intake leading to readmission several days later. - NYHA IV on admit - TEE this admit EF 20%, severe central MR - CO-OX 60% on milrinone 0.25 - CVP 9-10. Switch po Torsemide to IV lasix 80 BID. 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 This may be the last opportunity to consider advanced therapies including VAD. However, this decision is complicated by ongoing drug use and difficult social situation. He has been meeting with VAD team and SW to further discuss. - Will need to arrange RHC if he qualifies for VAD   2. AKI on CKD Stage IIIb - Creatinine on admit 2. Baseline previously ~ 1.6.  - Cr up slightly today, 1.77>1.99. Monitor closely. - Diuretics as above - Continue inotrope support   3. Substance Abuse - Last used cocaine about a week PTA. - Discussed cessation.  - reports he is motivated to quit so he can qualify for VAD  4. CAD - LHC 02/2022 nonobstructive CAD - LDL 124  - Increased atorvastatin to 80 mg daily    5. DMII -On SSI -Continue Farxiga   6. HTN  - BP improved   7. NSVT - Previously on amio but he stopped in April of this year.  I am not sure why he stopped.  - Frequent burst of NSVT noted on device since the beginning of December.  - Now on IV amio d/t frequent runs NSVT.  - Continue IV amio while on inotrope support, seems to be having less VT last couple of days - Keep K > 4.0 Mg > 2.0  - Off Coreg with low-output   8. Obesity  - Body mass index is 32.49 kg/m.   9. MR  - TEE 12/14: EF 20%, Severe central MR - HF may be too advanced to benefit from mTEER  10. Mediastinal lymph nodes, GGO RUL - Noted on preVAD CT - Will need repeat imaging in 3-6 months to follow   SDOH: -Has medicaid -Works part-time at [employer].  -SW and VAD team following to see if he would be VAD candidate. HF SW meeting with his sister today. -Currently living with Aunt and sleeps on the floor. Would need more stable living situation prior to VAD.     Length of Stay: 10  Douglas [name], [place], PA-C  7:08 AM  Advanced Heart Failure Team Pager [phone] (M-F; 7a - 5p)  Please contact [facility] for night-coverage after hours (5p -7a ) and weekends on [email or web address]  Patient seen  and examined with the above-signed Advanced Practice Provider and/or Housestaff. I personally reviewed laboratory data, imaging studies and relevant notes. I independently examined the patient and formulated the important aspects of the plan. I have edited the note to reflect any of my changes or salient points. I have personally discussed the plan with the patient and/or family.  Remains tenuous. Co-ox improved on higher dose milrinone. CVP 10. Still with DOE and orthopnea. SCr climbing again.   General:  Sitting up in bed  No resp difficulty HEENT: normal Neck: supple. no JVD. Carotids 2+ bilat; no bruits. No lymphadenopathy or thryomegaly appreciated. Cor: PMI nondisplaced. Regular rate & rhythm. 2/6 MR Lungs: minimal basilar crackles  Abdomen: soft, nontender, nondistended. No hepatosplenomegaly. No bruits or masses. Good bowel sounds. Extremities: no cyanosis, clubbing, rash, edema Neuro: alert & orientedx3, cranial nerves grossly intact. moves all 4 extremities w/o difficulty. Affect pleasant  Remains very tenuous. Now inotrope dependent. Continue milrinone. Give IV lasix today. Will discuss at [place] today. Will likely need mechanical support this admit. If approved will need RHC and possible NE support to optimize.   Glori Bickers, MD  8:58 AM

## 2022-11-04 NOTE — H&P (View-Only) (Signed)
Advanced Heart Failure Rounding Note  PCP-Cardiologist: None   Subjective:    Undergoing workup to see if he qualifies for VAD.  CO-OX 60% on milrinone 0.25.  CVP 9-10  Scr up slightly 1.77>>1.99. 2500 cc UOP charted yesterday with 100 mg Torsemide BID. Weight up 1 lb.  Dyspnea continues to improve. Still having intermittent cough especially with lying flat.   Objective:   Weight Range: 121.1 kg Body mass index is 32.49 kg/m.   Vital Signs:   Temp:  [97.8 F (36.6 C)-98.7 F (37.1 C)] 97.8 F (36.6 C) (12/21 0603) Pulse Rate:  [84-95] 91 (12/21 0603) Resp:  [17-18] 18 (12/21 0603) BP: (105-125)/(76-85) 105/85 (12/21 0603) SpO2:  [90 %-97 %] 90 % (12/21 0603) Weight:  [121.1 kg] 121.1 kg (12/21 0603) Last BM Date : 11/03/22  Weight change: Filed Weights   11/02/22 0400 11/03/22 0553 11/04/22 0603  Weight: 121.6 kg 120.5 kg 121.1 kg    Intake/Output:   Intake/Output Summary (Last 24 hours) at 11/04/2022 0708 Last data filed at 11/04/2022 0630 Gross per 24 hour  Intake 1624.93 ml  Output 2500 ml  Net -875.07 ml     Physical Exam    CVP 9-10 General:  Sitting up on side of bed. No distress. HEENT: normal Neck: supple. JVP ~ 10 cm. Carotids 2+ bilat; no bruits.  Cor: PMI nondisplaced. Regular rate & rhythm. No rubs, gallops, + 3/6 MR murmur Lungs: clear Abdomen: soft, nontender, nondistended.  Extremities: no cyanosis, clubbing, rash, trace edema Neuro: alert & orientedx3, cranial nerves grossly intact. moves all 4 extremities w/o difficulty. Affect pleasant     Telemetry    SR 80s-90s, ~ 5 PVCs min   Labs    CBC Recent Labs    11/02/22 1206 11/04/22 0330  WBC 9.7 7.6  NEUTROABS 5.6  --   HGB 14.6 14.4  HCT 43.6 42.2  MCV 85.0 84.6  PLT 267 249   Basic Metabolic Panel Recent Labs    40/81/44 0420 11/02/22 1206 11/03/22 0500 11/04/22 0330  NA 137   < > 136 137  K 3.9   < > 3.8 4.0  CL 100   < > 99 101  CO2 27   < > 25 26   GLUCOSE 123*   < > 252* 104*  BUN 32*   < > 29* 30*  CREATININE 1.89*   < > 1.77* 1.99*  CALCIUM 8.9   < > 8.4* 8.7*  MG 2.1  --   --   --    < > = values in this interval not displayed.   Liver Function Tests Recent Labs    11/02/22 1206  AST 19  ALT 22  ALKPHOS 63  BILITOT 1.4*  PROT 6.8  ALBUMIN 2.7*   No results for input(s): "LIPASE", "AMYLASE" in the last 72 hours. Cardiac Enzymes No results for input(s): "CKTOTAL", "CKMB", "CKMBINDEX", "TROPONINI" in the last 72 hours.  BNP: BNP (last 3 results) Recent Labs    10/18/22 1821 10/25/22 1442 10/28/22 1825  BNP 1,494.7* 1,428.2* 491.6*    ProBNP (last 3 results) No results for input(s): "PROBNP" in the last 8760 hours.   D-Dimer No results for input(s): "DDIMER" in the last 72 hours. Hemoglobin A1C Recent Labs    11/02/22 1206  HGBA1C 6.4*   Fasting Lipid Panel No results for input(s): "CHOL", "HDL", "LDLCALC", "TRIG", "CHOLHDL", "LDLDIRECT" in the last 72 hours. Thyroid Function Tests Recent Labs    11/02/22 1206  TSH 2.959    Other results:   Imaging    VAS US DOPPLER PRE VAD  Result Date: 11/03/2022 PERIOPERATIVE VASCULAR EVALUATION Patient Name:  Douglas [name]  Date of Exam:   11/03/2022 Medical Rec #: [id number]          Accession #:    [id number] Date of Birth: 12-Dec-1975         Patient Gender: M Patient Age:   46 years Exam Location:  [facility] Procedure:      VAS US DOPPLER PRE VAD Referring Phys: Quillian Quince Kryslyn Helbig --------------------------------------------------------------------------------  Indications:  Pre-VAD workup. Risk Factors: Hypertension, hyperlipidemia, Diabetes, current smoker, coronary               artery disease. Performing Technologist: Rogelia Rohrer RVT, RDMS Supporting Technologist: Darlin Coco RDMS, RVT  Examination Guidelines: A complete evaluation includes B-mode imaging, spectral Doppler, color Doppler, and power Doppler as needed of all accessible portions  of each vessel. Bilateral testing is considered an integral part of a complete examination. Limited examinations for reoccurring indications may be performed as noted.  Right Carotid Findings: +----------+--------+--------+--------+--------+------------------+           PSV cm/sEDV cm/sStenosisDescribeComments           +----------+--------+--------+--------+--------+------------------+ CCA Prox  85      15                                         +----------+--------+--------+--------+--------+------------------+ CCA Distal70      27                      intimal thickening +----------+--------+--------+--------+--------+------------------+ ICA Prox  33      14                                         +----------+--------+--------+--------+--------+------------------+ ICA Distal56      18                                         +----------+--------+--------+--------+--------+------------------+ ECA       109     19                                         +----------+--------+--------+--------+--------+------------------+ +----------+--------+-------+----------------+------------+           PSV cm/sEDV cmsDescribe        Arm Pressure +----------+--------+-------+----------------+------------+ Subclavian102            Multiphasic, WNL             +----------+--------+-------+----------------+------------+ +---------+--------+--+--------+-+---------+ VertebralPSV cm/s25EDV cm/s8Antegrade +---------+--------+--+--------+-+---------+ Left Carotid Findings: +----------+--------+--------+--------+--------+------------------+           PSV cm/sEDV cm/sStenosisDescribeComments           +----------+--------+--------+--------+--------+------------------+ CCA Prox  103     17                                         +----------+--------+--------+--------+--------+------------------+ CCA Distal71      19  intimal thickening  +----------+--------+--------+--------+--------+------------------+ ICA Prox  55      19                                         +----------+--------+--------+--------+--------+------------------+ ICA Distal51      22                                         +----------+--------+--------+--------+--------+------------------+ ECA       84      11                                         +----------+--------+--------+--------+--------+------------------+ +----------+--------+--------+----------------+------------+ SubclavianPSV cm/sEDV cm/sDescribe        Arm Pressure +----------+--------+--------+----------------+------------+           106             Multiphasic, [id number]          +----------+--------+--------+----------------+------------+ +---------+--------+--+--------+--+---------+ VertebralPSV cm/s27EDV cm/s11Antegrade +---------+--------+--+--------+--+---------+  ABI Findings: +---------+------------------+-----+---------+-----------------------------+ Right    Rt Pressure (mmHg)IndexWaveform Comment                       +---------+------------------+-----+---------+-----------------------------+ Brachial                        triphasicLine in right upper extremity +---------+------------------+-----+---------+-----------------------------+ PTA      151               1.11 triphasic                              +---------+------------------+-----+---------+-----------------------------+ DP       159               1.17 triphasic                              +---------+------------------+-----+---------+-----------------------------+ Great Toe                       Normal                                 +---------+------------------+-----+---------+-----------------------------+ +---------+------------------+-----+---------+-------+ Left     Lt Pressure (mmHg)IndexWaveform Comment +---------+------------------+-----+---------+-------+  Brachial 136                    triphasic        +---------+------------------+-----+---------+-------+ PTA      142               1.04 triphasic        +---------+------------------+-----+---------+-------+ DP       140               1.03 triphasic        +---------+------------------+-----+---------+-------+ Dorien Chihuahua                    Normal           +---------+------------------+-----+---------+-------+  Summary: Right Carotid: The extracranial vessels were near-normal with only minimal wall  thickening or plaque. Left Carotid: The extracranial vessels were near-normal with only minimal wall               thickening or plaque. Vertebrals:  Bilateral vertebral arteries demonstrate antegrade flow. Subclavians: Normal flow hemodynamics were seen in bilateral subclavian              arteries.  *See table(s) above for measurements and observations. Right ABI: Resting right ankle-brachial index is within normal range. The right toe-brachial index is normal. Left ABI: Resting left ankle-brachial index is within normal range. The left toe-brachial index is normal.  Electronically signed by Christopher Clark MD on 11/03/2022 at 4:30:11 PM.    Final    VAS US LOWER EXTREMITY VENOUS (DVT)  Result Date: 11/03/2022  Lower Venous DVT Study Patient Name:  Douglas [name]  Date of Exam:   11/03/2022 Medical Rec #: [id number]          Accession #:    [id number] Date of Birth: 09/01/1976         Patient Gender: M Patient Age:   46 years Exam Location:  [facility] Procedure:      VAS US LOWER EXTREMITY VENOUS (DVT) Referring Phys: Kiree Dejarnette --------------------------------------------------------------------------------  Indications: Pre-VAD workup.  Comparison Study: No prior studies. Performing Technologist: Rachel Hodge RDMS, RVT  Examination Guidelines: A complete evaluation includes B-mode imaging, spectral Doppler, color Doppler, and power Doppler as needed of  all accessible portions of each vessel. Bilateral testing is considered an integral part of a complete examination. Limited examinations for reoccurring indications may be performed as noted. The reflux portion of the exam is performed with the patient in reverse Trendelenburg.  +---------+---------------+---------+-----------+----------+--------------+ RIGHT    CompressibilityPhasicitySpontaneityPropertiesThrombus Aging +---------+---------------+---------+-----------+----------+--------------+ CFV      Full           Yes      Yes                                 +---------+---------------+---------+-----------+----------+--------------+ SFJ      Full                                                        +---------+---------------+---------+-----------+----------+--------------+ FV Prox  Full                                                        +---------+---------------+---------+-----------+----------+--------------+ FV Mid   Full                                                        +---------+---------------+---------+-----------+----------+--------------+ FV DistalFull                                                        +---------+---------------+---------+-----------+----------+--------------+ PFV        Full                                                        +---------+---------------+---------+-----------+----------+--------------+ POP      Full           Yes      Yes                                 +---------+---------------+---------+-----------+----------+--------------+ PTV      Full                                                        +---------+---------------+---------+-----------+----------+--------------+ PERO     Full                                                        +---------+---------------+---------+-----------+----------+--------------+    +---------+---------------+---------+-----------+----------+--------------+ LEFT     CompressibilityPhasicitySpontaneityPropertiesThrombus Aging +---------+---------------+---------+-----------+----------+--------------+ CFV      Full           Yes      Yes                                 +---------+---------------+---------+-----------+----------+--------------+ SFJ      Full                                                        +---------+---------------+---------+-----------+----------+--------------+ FV Prox  Full                                                        +---------+---------------+---------+-----------+----------+--------------+ FV Mid   Full                                                        +---------+---------------+---------+-----------+----------+--------------+ FV DistalFull                                                        +---------+---------------+---------+-----------+----------+--------------+ PFV      Full                                                        +---------+---------------+---------+-----------+----------+--------------+  POP      Full           Yes      Yes                                 +---------+---------------+---------+-----------+----------+--------------+ PTV      Full                                                        +---------+---------------+---------+-----------+----------+--------------+ PERO     Full                                                        +---------+---------------+---------+-----------+----------+--------------+     Summary: RIGHT: - There is no evidence of deep vein thrombosis in the lower extremity.  - No cystic structure found in the popliteal fossa.  LEFT: - There is no evidence of deep vein thrombosis in the lower extremity.  - No cystic structure found in the popliteal fossa.  *See table(s) above for measurements and observations. Electronically signed  by Christopher Clark MD on 11/03/2022 at 4:28:34 PM.    Final      Medications:     Scheduled Medications:  aspirin  81 mg Oral Daily   atorvastatin  80 mg Oral Daily   Chlorhexidine Gluconate Cloth  6 each Topical Daily   dapagliflozin propanediol  10 mg Oral QAC breakfast   enoxaparin (LOVENOX) injection  60 mg Subcutaneous Q24H   hydrALAZINE  25 mg Oral Q8H   insulin aspart  0-15 Units Subcutaneous TID WC   insulin glargine-yfgn  15 Units Subcutaneous Daily   isosorbide mononitrate  30 mg Oral Daily   sodium chloride flush  10-40 mL Intracatheter Q12H   sodium chloride flush  3 mL Intravenous Q12H   spironolactone  12.5 mg Oral Daily   torsemide  100 mg Oral BID    Infusions:  amiodarone 30 mg/hr (11/04/22 0630)   milrinone 0.25 mcg/kg/min (11/04/22 0630)    PRN Medications: acetaminophen **OR** acetaminophen, albuterol, ALPRAZolam, guaiFENesin-dextromethorphan, melatonin, polyethylene glycol, sodium chloride flush    Patient Profile   [name] is a 46 year old with a history of HTN, hyperlipidemia, smoker, cocaine abuse,NSVT,  NICM, Medtronic ICD, and chronic HFrEF. Last used cocaine about a week ago.  EF has been down for some time.    Admitted recently with A/C HFrEF. Suspect he had residual congestion when discharged.  Readmitted 12/11 with A/C HFrEF.  Assessment/Plan   1. A/C HFrEF  - NICM. Has Medtronic ICD. 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   - Admit early December. Suspect still had volume on board at discharge + increased fluid intake leading to readmission several days later. - NYHA IV on admit - TEE this admit EF 20%, severe central MR - CO-OX 60% on milrinone 0.25 - CVP 9-10. Switch po Torsemide to IV lasix 80 BID. 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 This may be the last opportunity to consider advanced therapies including VAD. However, this decision is complicated by ongoing drug use and difficult social situation. He has been meeting with VAD team and SW to further discuss. - Will need to arrange RHC if he qualifies for VAD   2. AKI on CKD Stage IIIb - Creatinine on admit 2. Baseline previously ~ 1.6.  - Cr up slightly today, 1.77>1.99. Monitor closely. - Diuretics as above - Continue inotrope support   3. Substance Abuse - Last used cocaine about a week PTA. - Discussed cessation.  - reports he is motivated to quit so he can qualify for VAD  4. CAD - LHC 02/2022 nonobstructive CAD - LDL 124  - Increased atorvastatin to 80 mg daily    5. DMII -On SSI -Continue Farxiga   6. HTN  - BP improved   7. NSVT - Previously on amio but he stopped in April of this year.  I am not sure why he stopped.  - Frequent burst of NSVT noted on device since the beginning of December.  - Now on IV amio d/t frequent runs NSVT.  - Continue IV amio while on inotrope support, seems to be having less VT last couple of days - Keep K > 4.0 Mg > 2.0  - Off Coreg with low-output   8. Obesity  - Body mass index is 32.49 kg/m.   9. MR  - TEE 12/14: EF 20%, Severe central MR - HF may be too advanced to benefit from mTEER  10. Mediastinal lymph nodes, GGO RUL - Noted on preVAD CT - Will need repeat imaging in 3-6 months to follow   SDOH: -Has medicaid -Works part-time at [employer].  -SW and VAD team following to see if he would be VAD candidate. HF SW meeting with his sister today. -Currently living with Aunt and sleeps on the floor. Would need more stable living situation prior to VAD.     Length of Stay: 10  [facility], [place], PA-C  7:08 AM  Advanced Heart Failure Team Pager [phone] (M-F; 7a - 5p)  Please contact [facility] for night-coverage after hours (5p -7a ) and weekends on [email or web address]  Patient seen  and examined with the above-signed Advanced Practice Provider and/or Housestaff. I personally reviewed laboratory data, imaging studies and relevant notes. I independently examined the patient and formulated the important aspects of the plan. I have edited the note to reflect any of my changes or salient points. I have personally discussed the plan with the patient and/or family.  Remains tenuous. Co-ox improved on higher dose milrinone. CVP 10. Still with DOE and orthopnea. SCr climbing again.   General:  Sitting up in bed  No resp difficulty HEENT: normal Neck: supple. no JVD. Carotids 2+ bilat; no bruits. No lymphadenopathy or thryomegaly appreciated. Cor: PMI nondisplaced. Regular rate & rhythm. 2/6 MR Lungs: minimal basilar crackles  Abdomen: soft, nontender, nondistended. No hepatosplenomegaly. No bruits or masses. Good bowel sounds. Extremities: no cyanosis, clubbing, rash, edema Neuro: alert & orientedx3, cranial nerves grossly intact. moves all 4 extremities w/o difficulty. Affect pleasant  Remains very tenuous. Now inotrope dependent. Continue milrinone. Give IV lasix today. Will discuss at [place] today. Will likely need mechanical support this admit. If approved will need RHC and possible NE support to optimize.   Glori Bickers, MD  8:58 AM

## 2022-11-04 NOTE — Consult Note (Signed)
Consultation Note Date: 11/04/2022   Patient Name: Douglas [name]  DOB: 1976-05-24  MRN: [id number]  Age / Sex: 46 y.o., male  PCP: Claiborne Rigg, NP Referring Physician: Zannie Cove, MD  Reason for Consultation: LVAD assessment   HPI/Patient Profile: 46 y.o. male   admitted on 10/25/2022 with past  medical history significant of V. tach status post ICD, seizures, hyperlipidemia, hypertension, diabetes, CKD 2, CHF, depression, DVT, substance use presenting with shortness of breath.   Patient presenting with shortness of breath occurring for the past several couple of days.  Has some worsening edema but not nearly as bad as previous admission.  Reports orthopnea as well.   He was recently admitted for similar symptoms and was discharged 4 days ago.  At discharge creatinine was 2.0, weight was improved to 272, Entresto was held and plan was for close follow-up with heart failure clinic given continued creatinine elevation.  12/11: Admitted on diuretics 12/12: Advanced HF team consulted 12/13: Milrinone started 12/20: CT surgery and LVAD SW consulted  Palliative medicine team consulted to assist in LVAD assessment    Clinical Assessment and Goals of Care:  This NP Lorinda Creed reviewed medical records, received report from team, assessed the patient and then meet at the bedside with  to discuss advanced directives and a preparedness plan considering LVAD.   A detailed discussion was had today regarding the concept of a preparedness plan as it relates to LVAD destination therapy     Patient was  comfortable talking about the "what ifs"  and the importance of today's conversation, understanding the seriousness of his medical situation,  gathering all the information to be a full participant in his healthcare decisions.   Concepts specific to future possibilities of -long term  ventilation -artificial feeding and hydration -psychological adjustments  Patient has dense social issues, currently living intermittently with aunt, sleeping on the floor .   Patient does have a supportive sister however she has her own health issues and limitations regarding her ability to offer assistance with her brother.     Patient was able to verbalize to his family the importance of quality of life.   He is hopeful that LVAD procedure will increase  quality  of life.         At this time patient is open to all available medical interventions to prolong life and the success of the LVAD therapy.  Patient was encouraged to continue conversation into the future as it is vital for the patient centered care.  Chaplain services offered and refused at this time.    No documented H POA or advanced care planning documents noted.  Will need to be further explored during this hospitalization and certainly prior to any surgical procedure.     See LCSW note   Unfortunately at this time I feel that patient faces many obstacles making him  a strong candidate for LVAD implantation.       Primary Diagnoses: Present on Admission:  Hyperlipidemia  Essential hypertension  Diabetes mellitus with stage 2 chronic kidney disease (HCC)  Depression, major, single episode  Acute on chronic systolic CHF (congestive heart failure) (HCC)  Acute renal failure superimposed on stage IIIb chronic kidney disease (HCC)  Obesity  Cocaine use   I have reviewed the medical record, interviewed the patient and family, and examined the patient. The following aspects are pertinent.  Past Medical History:  Diagnosis Date   Depression, major, single episode 01/14/2017   Diabetes mellitus [facility])    ED (erectile dysfunction)    History of syncope 01/29/2015   Hypertension    ICD (implantable cardioverter-defibrillator) discharge 11/30/2014   On 11/30/14. Asymptomatic.    Nonischemic cardiomyopathy (HCC)    a.   echo 4/06: EF 30%, mild to mod MR, mild RAE, inf HK, lat HK , ant HK;    b.  cath 4/06: no CAD, EF 20-25%   NSVT (nonsustained ventricular tachycardia) (HCC)    Obesity    Systolic CHF, chronic (HCC)    EF 11/17 25-30%, s/p ICD   Social History   Socioeconomic History   Marital status: Widowed    Spouse name: Not on file   Number of children: 5   Years of education: 46   Highest education level: Not on file  Occupational History   Occupation: [employer]    Comment: part-time  Tobacco Use   Smoking status: Every Day    Packs/day: 0.25    Years: 8.00    Total pack years: 2.00    Types: Cigarettes   Smokeless tobacco: Never   Tobacco comments:    ~3 cigarettes a day  Vaping Use   Vaping Use: Never used  Substance and Sexual Activity   Alcohol use: Yes    Alcohol/week: 0.0 standard drinks of alcohol    Comment: 2x week.    Drug use: Not Currently    Types: Cocaine    Comment: 08/10/17 - last use one week ago   Sexual activity: Yes  Other Topics Concern   Not on file  Social History Narrative   Referred to PCP- appointment made for 09/04/18 at 9:30am at [facility]      Provided with food pantry and free meal list- patient reported sometimes having issues paying for food but reports he does received food stamps and works part time- he does not pay for housing as he lives with his sister.      Patient uses his mother's car and reports no issues getting transport to to medical appointments.   Social Determinants of Health   Financial Resource Strain: High Risk (03/01/2022)   Overall Financial Resource Strain (CARDIA)    Difficulty of Paying Living Expenses: Hard  Food Insecurity: No Food Insecurity (03/01/2022)   Hunger Vital Sign    Worried About Running Out of Food in the Last Year: Never true    Ran Out of Food in the Last Year: Never true  Transportation Needs: No Transportation Needs (10/28/2022)   PRAPARE - Administrator, Civil Service (Medical): No     Lack of Transportation (Non-Medical): No  Physical Activity: Not on file  Stress: Not on file  Social Connections: Not on file   Family History  Problem Relation Age of Onset   Coronary artery disease Mother 30       s/p PCI   Lung cancer Father    Diabetes type II Maternal Uncle    Coronary artery disease Maternal Uncle    Stroke Neg Hx  Heart attack Neg Hx    Scheduled Meds:  aspirin  81 mg Oral Daily   atorvastatin  80 mg Oral Daily   Chlorhexidine Gluconate Cloth  6 each Topical Daily   dapagliflozin propanediol  10 mg Oral QAC breakfast   enoxaparin (LOVENOX) injection  60 mg Subcutaneous Q24H   furosemide  80 mg Intravenous BID   hydrALAZINE  25 mg Oral Q8H   insulin aspart  0-15 Units Subcutaneous TID WC   insulin glargine-yfgn  15 Units Subcutaneous Daily   isosorbide mononitrate  30 mg Oral Daily   sodium chloride flush  10-40 mL Intracatheter Q12H   sodium chloride flush  3 mL Intravenous Q12H   spironolactone  12.5 mg Oral Daily   Continuous Infusions:  amiodarone 30 mg/hr (11/04/22 0832)   milrinone 0.25 mcg/kg/min (11/04/22 0630)   PRN Meds:.acetaminophen **OR** acetaminophen, albuterol, ALPRAZolam, guaiFENesin-dextromethorphan, melatonin, polyethylene glycol, sodium chloride flush Medications Prior to Admission:  Prior to Admission medications   Medication Sig Start Date End Date Taking? Authorizing Provider  acetaminophen (TYLENOL) 500 MG tablet Take 1 tablet (500 mg total) by mouth every 6 (six) hours as needed. Patient taking differently: Take 1,000 mg by mouth every 6 (six) hours as needed for mild pain, moderate pain, fever or headache. 04/29/21  Yes Claiborne Rigg, NP  atorvastatin (LIPITOR) 40 MG tablet Take 1 tablet (40 mg total) by mouth daily. Patient taking differently: Take 40 mg by mouth in the morning. 03/05/22 03/05/23 Yes Sharol Harness, Brittainy M, PA-C  carvedilol (COREG) 3.125 MG tablet Take 1 tablet (3.125 mg total) by mouth 2 (two) times daily  with a meal. 09/22/22  Yes Milford, Anderson Malta, FNP  dapagliflozin propanediol (FARXIGA) 10 MG TABS tablet Take 1 tablet (10 mg total) by mouth daily before breakfast. Patient taking differently: Take 10 mg by mouth in the morning. 06/02/22 06/02/23 Yes Milford, Anderson Malta, FNP  insulin glargine (LANTUS SOLOSTAR) 100 UNIT/ML Solostar Pen Inject 36 Units into the skin once daily. Patient taking differently: Inject 36 Units into the skin in the morning. 08/09/22  Yes Claiborne Rigg, NP  metolazone (ZAROXOLYN) 2.5 MG tablet Take 1 pill every other Wednesday Patient taking differently: Take 2.5 mg by mouth as directed. 2.5 mg every other Monday as needed for fluid 06/02/22 12/17/22 Yes Milford, [place], FNP  potassium chloride SA (KLOR-CON M) 20 MEQ tablet Take 1 tablet (20 mEq total) by mouth 2 (two) times daily. 06/21/22  Yes Laurey Morale, MD  torsemide (DEMADEX) 100 MG tablet Take 1 tablet (100 mg total) by mouth in the morning AND 0.5 tablets (50 mg total) every evening. Patient taking differently: 100 mg every morning, 50 mg every evening 04/28/22  Yes Milford, Anderson Malta, FNP  aspirin 81 MG EC tablet Take 1 tablet (81 mg total) by mouth daily. Patient not taking: Reported on 10/25/2022 03/05/22   Robbie Lis M, PA-C  Insulin Pen Needle (BD PEN NEEDLE NANO U/F) 32G X 4 MM MISC INJECT 1 APPLICATION INTO THE SKIN DAILY. 08/24/22   Hoy Register, MD   Allergies  Allergen Reactions   Bydureon [Exenatide] Rash   Penicillins Other (See Comments)    Childhood allergy Unknown reaction   Review of Systems  Neurological:  Positive for weakness.   Physical Exam Constitutional:      Appearance: He is normal weight. He is ill-appearing.  Cardiovascular:     Rate and Rhythm: Normal rate.  Skin:    General: Skin is warm and  dry.  Neurological:     Mental Status: He is alert.     Vital Signs: BP 119/82   Pulse 78   Temp 98.7 F (37.1 C) (Oral)   Resp 18   Ht 6\' 4"  (1.93 m)   Wt 121.1 kg    SpO2 98%   BMI 32.49 kg/m  Pain Scale: 0-10   Pain Score: 0-No pain   SpO2: SpO2: 98 % O2 Device:SpO2: 98 % O2 Flow Rate: .O2 Flow Rate (L/min): 2 L/min  IO: Intake/output summary:  Intake/Output Summary (Last 24 hours) at 11/04/2022 0901 Last data filed at 11/04/2022 0802 Gross per 24 hour  Intake 1804.93 ml  Output 2500 ml  Net -695.07 ml    LBM: Last BM Date : 11/03/22 Baseline Weight: Weight: 127 kg Most recent weight: Weight: 121.1 kg     Palliative Assessment/Data:    PMT will continue to follow intermittently while hospitalized for palliative medicine needs and emotional support  Time In: 0800 Time Out: 0915 Time Total: 75 minutes Greater than 50%  of this time was spent counseling and coordinating care related to the above assessment and plan.  Signed by: 0916, NP   Please contact Palliative Medicine Team phone at [phone] for questions and concerns.  For individual provider: See [phone]

## 2022-11-04 NOTE — Progress Notes (Signed)
LVAD Initial Psychosocial Screening  Date/Time Initiated:  11-04-22 1:50 pm Referral Source: Alyce Pagan, LVAD Coordinator  Referral Reason:  LVAD implantation Source of Information:  Pt, sister and chart review  Demographics Name:  Douglas [name] Address:  [address] [place] [address] Cell: [phone] Marital Status:  Widowed  Faith:  Nondenominational  Primary Language:  English DOB:  02/22/1976  Medical & Follow-up Adherence to Medical regimen/INR checks: compliant  Medication adherence: compliant  Physician/Clinic Appointment Attendance: compliant   Advance Directives: Do you have a Living Will or Medical POA? No  Would you like to complete a Living Will and Medical POA prior to surgery?  Yes Do you have Goals of Care? Yes  Have you had a consult with the Palliative Care Team at [facility]? Yes  Psychological Health Appearance:  In hospital gown Mental Status:  Alert, oriented Eye Contact:  Good Thought Content:  Coherent Speech:  Logical/coherent Mood:  Depressed and Appropriate  Affect:  Appropriate to circumstance Insight:  Good Judgement: Unimpaired Interaction Style:  Engaged  Family/Social Information Who lives in your home? Name:   Relationship:   Douglas [name]  Aunt (Patient is currently sleeping on the floor at his Aunt's home as he is homeless)  Other family members/support persons in your life? Name:   Relationship:   Douglas [name]  Sister Douglas [name] Daughter 71 yo Douglas [name]  Daughter 86 yo Douglas [name]  Brother [place]   Brother  Caregiving Needs Who is the primary caregiver? Devona Health status:  Good Do you drive?  yes Do you work?  Full time Physical Limitations:  none Do you have other care giving responsibilities?  No    Contact number: [phone]  Who is the secondary caregiver? Unsure at the moment Health status:   Do you drive?   Do you work?   Physical Limitations:   Do you have other care giving  responsibilities?   Contact number:  Home Environment/Personal Care Do you have reliable phone service? Yes  If so, what is the number?  [phone] [employer] phone) Do you own or rent your home? Homeless (staying with Aunt and belongings all remain in his car) Number of steps into the home? none How many levels in the home? 1st floor apartment Assistive devices in the home? no Electrical needs for LVAD (3 prong outlets)? yes Second hand smoke exposure in the home? self Travel distance from [facility]? 6 minutes   Community Are you active with community agencies/resources/homecare? No Agency Name:  n/a Are you active in a church, synagogue, mosque or other faith based community? No Faith based institutions name:  n/a What other sources do you have for spiritual support? Not really Are you active in any clubs or social organizations? none What do you do for fun?  Hobbies?  Interests? cook  Education/Work Information What is the last grade of school you completed?  12 th  Preferred method of learning?  Written, Verbal, and Hands on Do you have any problems with reading or writing?  No Are you currently employed?  Yes  When were you last employed?  Name of employer? 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 9 months If you are not working, do you plan to return to work after VAD surgery? Yes If yes, what type of employment do you hope to find? Same job Are you interested in job training or learning new skills?  No Did you serve  in the military? No  If so, what branch? Other  Financial Information What is your source of income? Employed although currently out of work due to hospitalization Do you have difficulty meeting your monthly expenses? Yes If yes, which ones? All bills "I lost everything paying for past bills and housing" How do you cope with this? it's challenging Can you budget for the monthly cost for dressing  supplies post procedure? Yes  Primary Health insurance:  [employer] Secondary Insurance: Prescription plan: Medicaid What are your prescription co-pays? $4 Do you use mail order for your prescriptions?  No Have you ever had to refuse medication due to cost?  No Have you applied for Medicaid?  Current Have you applied for Social Security Disability (SSI)  No  Medical Information Briefly describe why you are here for evaluation: 20 years ago was hospitalized  Do you have a PCP or other medical provider? Claiborne Rigg, NP Are you able to complete your ADL's? yes Do you have a history of trauma, physical, emotional, or sexual abuse? none Do you have any family history of heart problems? mother Do you smoke now or past usage? Yes   6-7 ciggarettes a day for past 14 years Do you drink alcohol now or past usage? Yes    socially Are you currently using illegal drugs or misuse of medication or past usage? Yes  Cocaine is drug of choice 2x monthly for the past 14 years Have you ever been treated for substance abuse? Yes       If yes, where and when did you receive treatment? [facility] in [place], [place] inpatient program for 90 days  Mental Health History How have you been feeling in the past year? Pretty good - It was looking like I was going somewhere. Have you ever had any problems with depression, anxiety or other mental health issues? Recently yes..shortness of breath causing anxiety and depressed over multiple life events mostly death of family members (mother, father and spouse) Do you see a counselor, psychiatrist or therapist?  no If you are currently experiencing problems are you interested in talking with a professional? No maybe  Have you or are you taking medications for anxiety/depression or any mental health concerns?  No  What are your coping strategies under stressful situations? pray Are there any other stressors in your life? Housing and health Have you had  any past or current thoughts of suicide? no How many hours do you sleep at night? 2.5 hours How is your appetite? Pretty good Would you be interested in attending the LVAD support group? maybe  PHQ2 Depression Scale:5 PHQ9 Depression scale (if positive PHQ2 screen):  will complete PHQ9 prior to implant   Legal Do you currently have any legal issues/problems?  denies    Plan for VAD Implementation Do you know and understand what happens during the VAD surgery? Patient Verbalizes Understanding  of surgery and able to describe details What do you know about the risks and side effect associated with VAD surgery? Patient Verbalizes Understanding  of risks (infection, stroke and death) Explain what will happen right after surgery: Patient Verbalizes Understanding  of OR to ICU and will be intubated What is your plan for transportation for the first 8 weeks post-surgery? (Patients are not recommended to drive post-surgery for 8 weeks)  Driver:  multiple family members Do you have airbags in your vehicle?  There is a risk of discharging the device if the airbag were to deploy. What  do you know about your diet post-surgery? Patient Verbalizes Understanding  of Heart healthy How do you plan to monitor your medications, current and future?   Pill box How do you plan to complete ADL's post-surgery?  Ask for help Will it be difficult to ask for help from your caregivers?  no  Please explain what you hope will be improved about your life as a result of receiving the LVAD? "My well being" Please tell me your biggest concern or fear about living with the LVAD?  No concerns Please explain your understanding of how their body will change. Are you worried about these changes? No Do you see any barriers to your surgery or follow-up? None  Understanding of LVAD Patient states understanding of the following: Surgical procedures and risks, Electrical need for LVAD (3 prong outlets), Safety precautions with  LVAD (water, etc.), LVAD daily self-care (dressing changes, computer check, extra supplies), Outpatient follow up (LVAD clinic appts, monitoring blood thinners), and Need for Emergency Planning  Discussed and Reviewed with Patient and Caregiver  Patient's current level of motivation to prepare for LVAD: Motivated but acknowledges housing and finances as an issue Patient's present Level of Consent for LVAD: Ready and wants to pursue    Education provided to patient/family/caregiver:   Caregiver role and responsibiltiy, Financial planning for LVAD, Role of Clinical Social Worker, and Signs of Depression and Anxiety  Comments:    Caregiver questions CSW discussed LVAD implantation and recovery needs with patients sister via phone. Patient is homeless and currently residing at his Aunt's home sleeping on the floor. Sister reports patient has been chronically homeless over the years and lived with her in the past. He stayed for 3 years and she does not want to get back into that situation of asking him to leave. In addition, she reports that she has a 3 bedroom and 2 children in the home and he would not be on the lease which would jeopardize her housing. She plans to discuss with other family members about possible options for housing and caregiving and return call next week to CSW.   Clinical Interventions Needed:    CSW will assist with resources to assist with disability application if patient is wishing to complete prior to surgery. CSW will provide available resources if any for housing pending financial status. CSW will monitor signs and symptoms of depression and assist with adjustment to life with an LVAD. CSW will monitor tobacco and substance use and refer for treatment programs and/or Health Coaching to assist and support patient with a drug free lifestyle. CSW will refer patient for HPOA, Living Will if not completed prior to surgery if still wishing to complete.  CSW encouraged  attendance with the LVAD Support Group to assist further with adjustment and post implant peer support.  Clinical Impressions/Recommendations:   Patient is a 46 yo male who is currently homeless and sleeping on the floor at his aunt's home. He is a widower although was separated from his wife at the time of her death. He has 4 children 04/12/23, 40, 22 and 14) who reside independently and the 48 yo resides with her mother. He has a very supportive sister although unsure if she can assist with caregiver role due to working 2 shifts and jeopardizing her own housing. 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 He will not have any income as he has not been at the company for 12 consecutive months so not eligible  for any disability benefits through the company. He is currently using cocaine 2x monthly and tobacco 6-7 cigarettes daily. He shared that he started using substances when he was 46 years old and noted that his father died that same time and he was a good support for him. Due to history of chronic homelessness, lack of caregiver support, substance use and homelessness this patient may not be a good candidate for LVAD implantation at this time. CSW will continue to be available as needed to assist with social issues if needed.  Shane Crutch, CCSW-MCS [phone]

## 2022-11-04 NOTE — Plan of Care (Signed)

## 2022-11-05 ENCOUNTER — Ambulatory Visit (HOSPITAL_COMMUNITY): Admission: RE | Admit: 2022-11-05 | Payer: Medicaid Other | Source: Home / Self Care | Admitting: Internal Medicine

## 2022-11-05 ENCOUNTER — Encounter (HOSPITAL_COMMUNITY)
Admission: EM | Disposition: A | Discharge status: Hospice | Payer: Self-pay | Source: Home / Self Care | Attending: Pulmonary Disease

## 2022-11-05 DIAGNOSIS — I5023 Acute on chronic systolic (congestive) heart failure: Secondary | ICD-10-CM | POA: Diagnosis not present

## 2022-11-05 DIAGNOSIS — I340 Nonrheumatic mitral (valve) insufficiency: Secondary | ICD-10-CM

## 2022-11-05 HISTORY — PX: RIGHT HEART CATH: CATH118263

## 2022-11-05 LAB — POCT I-STAT EG7
Acid-Base Excess: 2 mmol/L (ref 0.0–2.0)
Acid-Base Excess: 2 mmol/L (ref 0.0–2.0)
Bicarbonate: 25.9 mmol/L (ref 20.0–28.0)
Bicarbonate: 26.8 mmol/L (ref 20.0–28.0)
Calcium, Ion: 1.19 mmol/L (ref 1.15–1.40)
Calcium, Ion: 1.2 mmol/L (ref 1.15–1.40)
HCT: 43 % (ref 39.0–52.0)
HCT: 44 % (ref 39.0–52.0)
Hemoglobin: 14.6 g/dL (ref 13.0–17.0)
Hemoglobin: 15 g/dL (ref 13.0–17.0)
O2 Saturation: 59 %
O2 Saturation: 63 %
Potassium: 3.8 mmol/L (ref 3.5–5.1)
Potassium: 3.8 mmol/L (ref 3.5–5.1)
Sodium: 137 mmol/L (ref 135–145)
Sodium: 138 mmol/L (ref 135–145)
TCO2: 27 mmol/L (ref 22–32)
TCO2: 28 mmol/L (ref 22–32)
pCO2, Ven: 37.6 mmHg — ABNORMAL LOW (ref 44–60)
pCO2, Ven: 39.5 mmHg — ABNORMAL LOW (ref 44–60)
pH, Ven: 7.439 — ABNORMAL HIGH (ref 7.25–7.43)
pH, Ven: 7.446 — ABNORMAL HIGH (ref 7.25–7.43)
pO2, Ven: 30 mmHg — CL (ref 32–45)
pO2, Ven: 31 mmHg — CL (ref 32–45)

## 2022-11-05 LAB — BASIC METABOLIC PANEL
Anion gap: 10 (ref 5–15)
BUN: 33 mg/dL — ABNORMAL HIGH (ref 6–20)
CO2: 24 mmol/L (ref 22–32)
Calcium: 9 mg/dL (ref 8.9–10.3)
Chloride: 102 mmol/L (ref 98–111)
Creatinine, Ser: 1.86 mg/dL — ABNORMAL HIGH (ref 0.61–1.24)
GFR, Estimated: 44 mL/min — ABNORMAL LOW (ref >60)
Glucose, Bld: 116 mg/dL — ABNORMAL HIGH (ref 70–99)
Potassium: 4.4 mmol/L (ref 3.5–5.1)
Sodium: 136 mmol/L (ref 135–145)

## 2022-11-05 LAB — GLUCOSE, CAPILLARY
Glucose-Capillary: 114 mg/dL — ABNORMAL HIGH (ref 70–99)
Glucose-Capillary: 130 mg/dL — ABNORMAL HIGH (ref 70–99)
Glucose-Capillary: 180 mg/dL — ABNORMAL HIGH (ref 70–99)
Glucose-Capillary: 135 mg/dL — ABNORMAL HIGH (ref 70–99)
Glucose-Capillary: 159 mg/dL — ABNORMAL HIGH (ref 70–99)

## 2022-11-05 LAB — COOXEMETRY PANEL
Carboxyhemoglobin: 2.1 % — ABNORMAL HIGH (ref 0.5–1.5)
Methemoglobin: 0.7 % (ref 0.0–1.5)
O2 Saturation: 63.6 %
Total hemoglobin: 15.5 g/dL (ref 12.0–16.0)

## 2022-11-05 LAB — MAGNESIUM: Magnesium: 2.2 mg/dL (ref 1.7–2.4)

## 2022-11-05 SURGERY — RIGHT HEART CATH
Anesthesia: LOCAL

## 2022-11-05 MED ORDER — ALTEPLASE 2 MG IJ SOLR
2.0000 mg | Freq: Once | INTRAMUSCULAR | Status: AC | PRN
  Administered 2022-11-05: 2 mg
  Filled 2022-11-05: qty 2

## 2022-11-05 MED ORDER — ENOXAPARIN SODIUM 40 MG/0.4ML IJ SOSY: 40.0000 mg | PREFILLED_SYRINGE | INTRAMUSCULAR | Status: DC

## 2022-11-05 MED ORDER — TORSEMIDE 20 MG PO TABS
40.0000 mg | ORAL_TABLET | Freq: Every day | ORAL | Status: DC
  Administered 2022-11-05 – 2022-11-07 (×3): 40 mg via ORAL
  Filled 2022-11-05 (×3): qty 2

## 2022-11-05 MED ORDER — HEPARIN (PORCINE) IN NACL 1000-0.9 UT/500ML-% IV SOLN
INTRAVENOUS | Status: DC | PRN
  Administered 2022-11-05: 500 mL

## 2022-11-05 MED ORDER — LIDOCAINE HCL (PF) 1 % IJ SOLN
INTRAMUSCULAR | Status: DC | PRN
  Administered 2022-11-05: 2 mL via SUBCUTANEOUS

## 2022-11-05 MED ORDER — FUROSEMIDE 10 MG/ML IJ SOLN: 80.0000 mg | Freq: Two times a day (BID) | INTRAMUSCULAR | Status: DC

## 2022-11-05 MED ORDER — SODIUM CHLORIDE 0.9 % IV SOLN
250.0000 mL | INTRAVENOUS | Status: DC | PRN
  Administered 2022-11-13: 250 mL via INTRAVENOUS

## 2022-11-05 MED ORDER — ENOXAPARIN SODIUM 60 MG/0.6ML IJ SOSY
60.0000 mg | PREFILLED_SYRINGE | INTRAMUSCULAR | Status: DC
  Administered 2022-11-06 – 2022-11-11 (×6): 60 mg via SUBCUTANEOUS
  Filled 2022-11-05 (×6): qty 0.6

## 2022-11-05 MED ORDER — LABETALOL HCL 5 MG/ML IV SOLN: 10.0000 mg | INTRAVENOUS | Status: AC | PRN

## 2022-11-05 MED ORDER — HYDRALAZINE HCL 20 MG/ML IJ SOLN: 10.0000 mg | INTRAMUSCULAR | Status: AC | PRN

## 2022-11-05 MED ORDER — SODIUM CHLORIDE 0.9% FLUSH: 3.0000 mL | INTRAVENOUS | Status: DC | PRN

## 2022-11-05 MED ORDER — HEPARIN (PORCINE) IN NACL 1000-0.9 UT/500ML-% IV SOLN
INTRAVENOUS | Status: AC
  Filled 2022-11-05: qty 500

## 2022-11-05 MED ORDER — ACETAMINOPHEN 325 MG PO TABS
650.0000 mg | ORAL_TABLET | ORAL | Status: DC | PRN
  Filled 2022-11-05: qty 2

## 2022-11-05 MED ORDER — SODIUM CHLORIDE 0.9% FLUSH
3.0000 mL | Freq: Two times a day (BID) | INTRAVENOUS | Status: DC
  Administered 2022-11-09 – 2022-11-15 (×5): 3 mL via INTRAVENOUS

## 2022-11-05 MED ORDER — LIDOCAINE HCL (PF) 1 % IJ SOLN
INTRAMUSCULAR | Status: AC
  Filled 2022-11-05: qty 30

## 2022-11-05 SURGICAL SUPPLY — 7 items
CATH SWAN GANZ 7F STRAIGHT (CATHETERS) IMPLANT
GLIDESHEATH SLENDER 7FR .021G (SHEATH) IMPLANT
GUIDEWIRE .025 260CM (WIRE) IMPLANT
KIT MICROPUNCTURE NIT STIFF (SHEATH) IMPLANT
PACK CARDIAC CATHETERIZATION (CUSTOM PROCEDURE TRAY) ×1 IMPLANT
SHEATH PROBE COVER 6X72 (BAG) IMPLANT
TRANSDUCER W/STOPCOCK (MISCELLANEOUS) ×1 IMPLANT

## 2022-11-05 NOTE — Consult Note (Addendum)
[address]       [place] [address]             [phone]                    [facility] Medical Record #[id number] Date of Birth: Apr 01, 1976  Referring: No ref. provider found Primary Care: Claiborne Rigg, NP Primary Cardiologist: None  Chief Complaint:    Chief Complaint  Patient presents with   Shortness of Breath    History of Present Illness:    Douglas [name] is a 46 y.o. 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   Per admission h and p "Patient reports worsening shortness of breath over the past 1 day.  He has a mild cough associated with this.  He has some chest discomfort when he coughs, but not with exertion.  He has not noticed a change in his chronic leg swelling.  He reports adherence with all of his medications.  He typically tries to avoid salty foods but did have a salty meal 2 nights ago.  He denies fevers or chills.  Denies leg tenderness, and denies hemoptysis.   EF was 25-30% with global hypokinesis, severely dilated LV, moderately reduced RV systolic function, severe left atrial enlargement, and moderate-severe MR on TTE in April 2023.    ED Course: Upon arrival to the ED, patient is found to be afebrile and saturating mid 90s on room air with mild tachypnea and stable blood pressure.  EKG demonstrates sinus rhythm with right axis deviation and QTc 502.  Chest x-ray with cardiomegaly, vascular congestion, parabronchial thickening, and interstitial prominence.  Blood work notable for BNP 1004-95, troponin 124, and creatinine 1.94.  He was given 80 mg IV Lasix in the ED."  He states to me that housing has been a chronic issue. Not independently living. 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 Would be amenable to taking meds daily  and checking coumadin.    Past Medical History:  Diagnosis Date   Depression, major, single episode 01/14/2017   Diabetes mellitus [facility])    ED (erectile dysfunction)    History of syncope 01/29/2015   Hypertension    ICD (implantable cardioverter-defibrillator) discharge 11/30/2014   On 11/30/14. Asymptomatic.    Nonischemic cardiomyopathy (HCC)    a.  echo 4/06: EF 30%, mild to mod MR, mild RAE, inf HK, lat HK , ant HK;    b.  cath 4/06: no CAD, EF 20-25%   NSVT (nonsustained ventricular tachycardia) (HCC)    Obesity    Systolic CHF, chronic (HCC)    EF 11/17 25-30%, s/p ICD    Past Surgical History:  Procedure Laterality Date   CARDIAC CATHETERIZATION  09/2011; 02/2013; 04/2013   CARDIAC DEFIBRILLATOR PLACEMENT  08/23/2013   IMPLANTABLE CARDIOVERTER DEFIBRILLATOR IMPLANT N/A 08/23/2013   Procedure: IMPLANTABLE CARDIOVERTER DEFIBRILLATOR IMPLANT;  Surgeon: Duke Salvia, MD;  Location: [facility] CATH LAB;  Service: Cardiovascular;  Laterality: N/A;   LEFT AND RIGHT HEART CATHETERIZATION WITH CORONARY ANGIOGRAM N/A 09/20/2011   Procedure: LEFT AND RIGHT HEART CATHETERIZATION WITH CORONARY ANGIOGRAM;  Surgeon: Dolores Patty, MD;  Location: [facility] CATH LAB;  Service: Cardiovascular;  Laterality: N/A;   MULTIPLE EXTRACTIONS WITH ALVEOLOPLASTY N/A 01/26/2013   Procedure:  Jennefer Bravo  TOOTH # 19 WITH ALVEOLOPLASTY;  Surgeon: Charlynne Pander, DDS;  Location: [facility] OR;  Service: Oral Surgery;  Laterality: N/A;   RIGHT HEART CATHETERIZATION N/A 02/22/2013   Procedure: RIGHT HEART CATH;  Surgeon: Dolores Patty, MD;  Location: [facility] CATH LAB;  Service: Cardiovascular;  Laterality: N/A;   RIGHT HEART CATHETERIZATION N/A 05/03/2013   Procedure: RIGHT HEART CATH;  Surgeon: Dolores Patty, MD;  Location: [facility] CATH LAB;  Service: Cardiovascular;  Laterality: N/A;   RIGHT/LEFT HEART CATH AND CORONARY ANGIOGRAPHY N/A 03/03/2022   Procedure: RIGHT/LEFT HEART CATH AND CORONARY ANGIOGRAPHY;  Surgeon: Dolores Patty, MD;  Location: [facility] INVASIVE CV LAB;  Service: Cardiovascular;  Laterality: N/A;   TEE WITHOUT CARDIOVERSION N/A 10/28/2022   Procedure: TRANSESOPHAGEAL ECHOCARDIOGRAM (TEE);  Surgeon: Dolores Patty, MD;  Location: [facility] ENDOSCOPY;  Service: Cardiovascular;  Laterality: N/A;    Family History  Problem Relation Age of Onset   Coronary artery disease Mother 59       s/p PCI   Lung cancer Father    Diabetes type II Maternal Uncle    Coronary artery disease Maternal Uncle    Stroke Neg Hx    Heart attack Neg Hx      Social History   Tobacco Use  Smoking Status Every Day   Packs/day: 0.25   Years: 8.00   Total pack years: 2.00   Types: Cigarettes  Smokeless Tobacco Never  Tobacco Comments   ~3 cigarettes a day    Social History   Substance and Sexual Activity  Alcohol Use Yes   Alcohol/week: 0.0 standard drinks of alcohol   Comment: 2x week.      Allergies  Allergen Reactions   Bydureon [Exenatide] Rash   Penicillins Other (See Comments)    Childhood allergy Unknown reaction    Current Facility-Administered Medications  Medication Dose Route Frequency Provider Last Rate Last Admin   0.9 %  sodium chloride infusion  250 mL Intravenous PRN Andrey Farmer, PA-C       0.9 %  sodium chloride infusion   Intravenous Continuous Andrey Farmer, PA-C 10 mL/hr at 11/05/22 0510 Bolus from Bag at 11/05/22 0510   [MAR Hold] acetaminophen (TYLENOL) tablet 650 mg  650 mg Oral Q6H PRN Synetta Fail, MD   650 mg at 11/01/22 0857   Or   [MAR Hold] acetaminophen (TYLENOL) suppository 650 mg  650 mg Rectal Q6H PRN Synetta Fail, MD       [MAR Hold] albuterol (PROVENTIL) (2.5 MG/3ML) 0.083% nebulizer solution 2.5 mg  2.5 mg Nebulization Once Berenize Gatlin, Waverly Ferrari, MD       [MAR Hold] ALPRAZolam Prudy Feeler) tablet 0.5 mg  0.5 mg Oral BID PRN Zannie Cove, MD   0.5 mg at 11/04/22 2006   [MAR Hold] alteplase (CATHFLO ACTIVASE) injection 2 mg  2 mg Intracatheter Once  PRN John Giovanni, MD       amiodarone (NEXTERONE PREMIX) 360-4.14 MG/200ML-% (1.8 mg/mL) IV infusion  30 mg/hr Intravenous Continuous Andrey Farmer, PA-C 16.67 mL/hr at 11/05/22 0652 30 mg/hr at 11/05/22 0652   [MAR Hold] aspirin chewable tablet 81 mg  81 mg Oral Daily Zannie Cove, MD   81 mg at 11/04/22 0942   [MAR Hold] atorvastatin (LIPITOR) tablet 80 mg  80 mg Oral Daily Earnie Larsson, RPH   80 mg at 11/04/22 0941   [MAR Hold] Chlorhexidine Gluconate Cloth 2 % PADS 6 each  6 each Topical Daily Zannie Cove,  MD   6 each at 11/04/22 1000   [MAR Hold] dapagliflozin propanediol (FARXIGA) tablet 10 mg  10 mg Oral QAC breakfast Synetta Fail, MD   10 mg at 11/04/22 0856   [MAR Hold] enoxaparin (LOVENOX) injection 60 mg  60 mg Subcutaneous Q24H Synetta Fail, MD   60 mg at 11/04/22 2006   [MAR Hold] guaiFENesin-dextromethorphan (ROBITUSSIN DM) 100-10 MG/5ML syrup 15 mL  15 mL Oral Q6H PRN Carollee Herter, DO   15 mL at 11/04/22 1716   [MAR Hold] hydrALAZINE (APRESOLINE) tablet 25 mg  25 mg Oral Q8H Andrey Farmer, [place]   25 mg at 11/05/22 0510   [MAR Hold] insulin aspart (novoLOG) injection 0-15 Units  0-15 Units Subcutaneous TID [facility] Synetta Fail, MD   2 Units at 11/04/22 1716   [MAR Hold] insulin glargine-yfgn (SEMGLEE) injection 15 Units  15 Units Subcutaneous Daily Synetta Fail, MD   15 Units at 11/04/22 0952   [MAR Hold] isosorbide mononitrate (IMDUR) 24 hr tablet 30 mg  30 mg Oral Daily Andrey Farmer, PA-C   30 mg at 11/04/22 0942   [MAR Hold] melatonin tablet 10 mg  10 mg Oral QHS PRN Carollee Herter, DO   10 mg at 11/04/22 2006   milrinone (PRIMACOR) 20 MG/100 ML (0.2 mg/mL) infusion  0.25 mcg/kg/min Intravenous Continuous Andrey Farmer, PA-C 9.25 mL/hr at 11/05/22 0628 0.25 mcg/kg/min at 11/05/22 0628   [MAR Hold] polyethylene glycol (MIRALAX / GLYCOLAX) packet 17 g  17 g Oral Daily PRN Synetta Fail, MD       [facility] Hold] sodium  chloride flush (NS) 0.9 % injection 10-40 mL  10-40 mL Intracatheter Q12H Zannie Cove, MD   10 mL at 11/04/22 0942   [MAR Hold] sodium chloride flush (NS) 0.9 % injection 10-40 mL  10-40 mL Intracatheter PRN Zannie Cove, MD       [facility] Hold] sodium chloride flush (NS) 0.9 % injection 3 mL  3 mL Intravenous Q12H Synetta Fail, MD   3 mL at 11/04/22 0942   sodium chloride flush (NS) 0.9 % injection 3 mL  3 mL Intravenous PRN Andrey Farmer, PA-C       [facility] Hold] spironolactone (ALDACTONE) tablet 12.5 mg  12.5 mg Oral Daily Andrey Farmer, PA-C   12.5 mg at 11/04/22 0941    ROS 14 point ROS reviewed and negative except as per HPI   PHYSICAL EXAMINATION: BP (!) 117/92 (BP Location: Left Arm)   Pulse 78   Temp 98.4 F (36.9 C) (Oral)   Resp 18   Ht 6\' 4"  (1.93 m)   Wt 121.7 kg   SpO2 92%   BMI 32.66 kg/m   Gen: NAD Neuro: Alert and oriented CV: + murmur, no prior chest incision Resp: Nonlaboured Abd: Soft, ntnd Extr: WWP  Diagnostic Studies & Laboratory data:     Recent Radiology Findings:   VAS DOPPLER PRE VAD  Result Date: 11/03/2022 PERIOPERATIVE VASCULAR EVALUATION Patient Name:  Douglas [name]  Date of Exam:   11/03/2022 Medical Rec #: [id number]          Accession #:    [id number] Date of Birth: 1976-06-18         Patient Gender: M Patient Age:   26 years Exam Location:  [facility] Procedure:      VAS [facility] DOPPLER PRE VAD Referring Phys: [place] BENSIMHON --------------------------------------------------------------------------------  Indications:  Pre-VAD workup. Risk Factors: Hypertension, hyperlipidemia,  Diabetes, current smoker, coronary               artery disease. Performing Technologist: Ernestene Mention RVT, RDMS Supporting Technologist: Jean Rosenthal RDMS, RVT  Examination Guidelines: A complete evaluation includes B-mode imaging, spectral Doppler, color Doppler, and power Doppler as needed of all accessible portions of each vessel.  Bilateral testing is considered an integral part of a complete examination. Limited examinations for reoccurring indications may be performed as noted.  Right Carotid Findings: +----------+--------+--------+--------+--------+------------------+           PSV cm/sEDV cm/sStenosisDescribeComments           +----------+--------+--------+--------+--------+------------------+ CCA Prox  85      15                                         +----------+--------+--------+--------+--------+------------------+ CCA Distal70      27                      intimal thickening +----------+--------+--------+--------+--------+------------------+ ICA Prox  33      14                                         +----------+--------+--------+--------+--------+------------------+ ICA Distal56      18                                         +----------+--------+--------+--------+--------+------------------+ ECA       109     19                                         +----------+--------+--------+--------+--------+------------------+ +----------+--------+-------+----------------+------------+           PSV cm/sEDV cmsDescribe        Arm Pressure +----------+--------+-------+----------------+------------+ Subclavian102            Multiphasic, WNL             +----------+--------+-------+----------------+------------+ +---------+--------+--+--------+-+---------+ VertebralPSV cm/s25EDV cm/s8Antegrade +---------+--------+--+--------+-+---------+ Left Carotid Findings: +----------+--------+--------+--------+--------+------------------+           PSV cm/sEDV cm/sStenosisDescribeComments           +----------+--------+--------+--------+--------+------------------+ CCA Prox  103     17                                         +----------+--------+--------+--------+--------+------------------+ CCA Distal71      19                      intimal thickening  +----------+--------+--------+--------+--------+------------------+ ICA Prox  55      19                                         +----------+--------+--------+--------+--------+------------------+ ICA Distal51      22                                         +----------+--------+--------+--------+--------+------------------+  ECA       84      11                                         +----------+--------+--------+--------+--------+------------------+ +----------+--------+--------+----------------+------------+ SubclavianPSV cm/sEDV cm/sDescribe        Arm Pressure +----------+--------+--------+----------------+------------+           106             Multiphasic, [id number]          +----------+--------+--------+----------------+------------+ +---------+--------+--+--------+--+---------+ VertebralPSV cm/s27EDV cm/s11Antegrade +---------+--------+--+--------+--+---------+  ABI Findings: +---------+------------------+-----+---------+-----------------------------+ Right    Rt Pressure (mmHg)IndexWaveform Comment                       +---------+------------------+-----+---------+-----------------------------+ Brachial                        triphasicLine in right upper extremity +---------+------------------+-----+---------+-----------------------------+ PTA      151               1.11 triphasic                              +---------+------------------+-----+---------+-----------------------------+ DP       159               1.17 triphasic                              +---------+------------------+-----+---------+-----------------------------+ Great Toe                       Normal                                 +---------+------------------+-----+---------+-----------------------------+ +---------+------------------+-----+---------+-------+ Left     Lt Pressure (mmHg)IndexWaveform Comment +---------+------------------+-----+---------+-------+  Brachial 136                    triphasic        +---------+------------------+-----+---------+-------+ PTA      142               1.04 triphasic        +---------+------------------+-----+---------+-------+ DP       140               1.03 triphasic        +---------+------------------+-----+---------+-------+ [place] Shown                    Normal           +---------+------------------+-----+---------+-------+  Summary: Right Carotid: The extracranial vessels were near-normal with only minimal wall                thickening or plaque. Left Carotid: The extracranial vessels were near-normal with only minimal wall               thickening or plaque. Vertebrals:  Bilateral vertebral arteries demonstrate antegrade flow. Subclavians: Normal flow hemodynamics were seen in bilateral subclavian              arteries.  *See table(s) above for measurements and observations. Right ABI: Resting right ankle-brachial index is within normal range. The right toe-brachial index is normal. Left ABI: Resting left ankle-brachial index is within normal  range. The left toe-brachial index is normal.  Electronically signed by Douglas Hess MD on 11/03/2022 at 4:30:11 PM.    Final    VAS [place] LOWER EXTREMITY VENOUS (DVT)  Result Date: 11/03/2022  Lower Venous DVT Study Patient Name:  Douglas [name]  Date of Exam:   11/03/2022 Medical Rec #: [id number]          Accession #:    [id number] Date of Birth: [birth year]         Patient Gender: M Patient Age:   49 years Exam Location:  [facility] Procedure:      VAS [place] LOWER EXTREMITY VENOUS (DVT) Referring Phys: Reuel Boom BENSIMHON --------------------------------------------------------------------------------  Indications: Pre-VAD workup.  Comparison Study: No prior studies. Performing Technologist: Jean Rosenthal RDMS, RVT  Examination Guidelines: A complete evaluation includes B-mode imaging, spectral Doppler, color Doppler, and power Doppler as needed of  all accessible portions of each vessel. Bilateral testing is considered an integral part of a complete examination. Limited examinations for reoccurring indications may be performed as noted. The reflux portion of the exam is performed with the patient in reverse Trendelenburg.  +---------+---------------+---------+-----------+----------+--------------+ RIGHT    CompressibilityPhasicitySpontaneityPropertiesThrombus Aging +---------+---------------+---------+-----------+----------+--------------+ CFV      Full           Yes      Yes                                 +---------+---------------+---------+-----------+----------+--------------+ SFJ      Full                                                        +---------+---------------+---------+-----------+----------+--------------+ FV Prox  Full                                                        +---------+---------------+---------+-----------+----------+--------------+ FV Mid   Full                                                        +---------+---------------+---------+-----------+----------+--------------+ FV DistalFull                                                        +---------+---------------+---------+-----------+----------+--------------+ PFV      Full                                                        +---------+---------------+---------+-----------+----------+--------------+ POP      Full           Yes      Yes                                 +---------+---------------+---------+-----------+----------+--------------+  PTV      Full                                                        +---------+---------------+---------+-----------+----------+--------------+ PERO     Full                                                        +---------+---------------+---------+-----------+----------+--------------+    +---------+---------------+---------+-----------+----------+--------------+ LEFT     CompressibilityPhasicitySpontaneityPropertiesThrombus Aging +---------+---------------+---------+-----------+----------+--------------+ CFV      Full           Yes      Yes                                 +---------+---------------+---------+-----------+----------+--------------+ SFJ      Full                                                        +---------+---------------+---------+-----------+----------+--------------+ FV Prox  Full                                                        +---------+---------------+---------+-----------+----------+--------------+ FV Mid   Full                                                        +---------+---------------+---------+-----------+----------+--------------+ FV DistalFull                                                        +---------+---------------+---------+-----------+----------+--------------+ PFV      Full                                                        +---------+---------------+---------+-----------+----------+--------------+ POP      Full           Yes      Yes                                 +---------+---------------+---------+-----------+----------+--------------+ PTV      Full                                                        +---------+---------------+---------+-----------+----------+--------------+  PERO     Full                                                        +---------+---------------+---------+-----------+----------+--------------+     Summary: RIGHT: - There is no evidence of deep vein thrombosis in the lower extremity.  - No cystic structure found in the popliteal fossa.  LEFT: - There is no evidence of deep vein thrombosis in the lower extremity.  - No cystic structure found in the popliteal fossa.  *See table(s) above for measurements and observations. Electronically signed  by Douglas Hess MD on 11/03/2022 at 4:28:34 PM.    Final    CT CHEST ABDOMEN PELVIS WO CONTRAST  Result Date: 11/02/2022 CLINICAL DATA:  LVAD evaluation; preop r/u for surgical contraindication EXAM: CT CHEST, ABDOMEN AND PELVIS WITHOUT CONTRAST TECHNIQUE: Multidetector CT imaging of the chest, abdomen and pelvis was performed following the standard protocol without IV contrast. RADIATION DOSE REDUCTION: This exam was performed according to the departmental dose-optimization program which includes automated exposure control, adjustment of the mA and/or kV according to patient size and/or use of iterative reconstruction technique. COMPARISON:  None Available. FINDINGS: CT CHEST FINDINGS Cardiovascular: Left chest wall cardiac defibrillator. Right PICC with tip terminating in the distal the superior vena cava. No significant pericardial effusion. The thoracic aorta is normal in caliber. No atherosclerotic plaque of the thoracic aorta. No coronary artery calcifications. The main pulmonary artery is enlarged in caliber measuring up to 3.9 cm. Mediastinum/Nodes: Multiple prominent mediastinal lymph nodes as well as enlarged precarinal lymph node measuring 1.3 cm (3:25). Question right hilar lymphadenopathy (3:28), noting limited sensitivity for the detection of hilar adenopathy on this noncontrast study. No axillary lymph nodes. Thyroid gland, trachea, and esophagus demonstrate no significant findings. Lungs/Pleura: Patchy bilateral ground-glass airspace opacities most prominent within the right upper lobe measuring up to 1.2 x 1.2 cm (4:30). No pulmonary mass. No pleural effusion. No pneumothorax. Musculoskeletal: No chest wall abnormality. No suspicious lytic or blastic osseous lesions. No acute displaced fracture. Multilevel degenerative changes of the spine. CT ABDOMEN PELVIS FINDINGS Hepatobiliary: No focal liver abnormality. No gallstones, gallbladder wall thickening, or pericholecystic fluid. No biliary  dilatation. Pancreas: No focal lesion. Normal pancreatic contour. No surrounding inflammatory changes. No main pancreatic ductal dilatation. Spleen: Normal in size without focal abnormality. Adrenals/Urinary Tract: No adrenal nodule bilaterally. No nephrolithiasis and no hydronephrosis. No definite contour-deforming renal mass. No ureterolithiasis or hydroureter. The urinary bladder is unremarkable. Stomach/Bowel: Stomach is within normal limits. No evidence of bowel wall thickening or dilatation. Appendix appears normal. Vascular/Lymphatic: No abdominal aorta or iliac aneurysm. No abdominal, pelvic, or inguinal lymphadenopathy. Reproductive: Prostate is unremarkable. Other: No intraperitoneal free fluid. No intraperitoneal free gas. No organized fluid collection. Musculoskeletal: Small fat containing umbilical hernia. Bilateral small inguinal hernias. Subcutaneus soft tissue edema along the left anterior abdomen likely due to medication injection. No suspicious lytic or blastic osseous lesions. No acute displaced fracture. Multilevel degenerative changes of the spine. IMPRESSION: 1. Patchy bilateral ground-glass airspace opacities most prominent within the right upper lobe measuring up to 1.2 x 1.2 cm Findings suggestive of infection/inflammation. Recommend follow-up CT in 3 months to evaluate for resolution. 2. Multiple prominent mediastinal lymph nodes as well as enlarged precarinal lymph node measuring 1.3 cm. Question right hilar lymphadenopathy, noting limited sensitivity for  the detection of hilar adenopathy on this noncontrast study. Findings may be reactive in etiology. 3. Cardiomegaly. 4. Enlarged main pulmonary artery suggestive of pulmonary hypertension. 5. Small fat containing umbilical hernia. Bilateral small inguinal hernias. 6. No acute intra-abdominal or intrapelvic abnormality with limited evaluation on this noncontrast study. Electronically Signed   By: Tish Frederickson M.D.   On: 11/02/2022 21:35    DG Orthopantogram  Result Date: 11/02/2022 CLINICAL DATA:  Preop. EXAMTresa Endo COMPARISON:  January 24, 2013. FINDINGS: No fracture or lytic area is seen involving the mandible. Several missing teeth are noted. IMPRESSION: No significant abnormality seen involving the mandible. Electronically Signed   By: Lupita Raider M.D.   On: 11/02/2022 16:45   DG CHEST PORT 1 VIEW  Result Date: 10/28/2022 CLINICAL DATA:  Dyspnea EXAM: PORTABLE CHEST 1 VIEW COMPARISON:  10/25/2022 FINDINGS: Left-sided implanted cardiac device remains in place. Stable cardiomegaly. Pulmonary vascular congestion with mildly prominent perihilar and bibasilar interstitial markings. No pleural effusion or pneumothorax. IMPRESSION: Appearance favoring CHF with mild edema, slightly improved from prior. Electronically Signed   By: Duanne Guess D.O.   On: 10/28/2022 18:06   [place] EKG SITE RITE  Result Date: 10/26/2022 If Site Rite image not attached, placement could not be confirmed due to current cardiac rhythm.  ECHOCARDIOGRAM COMPLETE  Result Date: 10/26/2022    ECHOCARDIOGRAM REPORT   Patient Name:   Douglas [name] Date of Exam: 10/26/2022 Medical Rec #:  [id number]         Height:       76.0 in Accession #:    [id number]        Weight:       280.0 lb Date of Birth:  July 10, 1976        BSA:          2.556 m Patient Age:    46 years          BP:           122/101 mmHg Patient Gender: M                 HR:           112 bpm. Exam Location:  Inpatient Procedure: 2D Echo, Cardiac Doppler, Color Doppler and 3D Echo Indications:    CHF-Acute Systolic I50.21  History:        Patient has prior history of Echocardiogram examinations, most                 recent 03/02/2022. Cardiomyopathy, Defibrillator; Risk                 Factors:Hypertension and Diabetes.  Sonographer:    Leta Jungling RDCS Referring Phys: [id number] Cecille Po MELVIN  Sonographer Comments: Image acquisition challenging due to respiratory motion and  coughing. IMPRESSIONS  1. Left ventricular ejection fraction, by estimation, is 20 to 25%. The left ventricle has severely decreased function. The left ventricle demonstrates global hypokinesis. The left ventricular internal cavity size was severely dilated. Left ventricular diastolic parameters are indeterminate.  2. Right ventricular systolic function is low normal. The right ventricular size is severely enlarged. There is severely elevated pulmonary artery systolic pressure.  3. Left atrial size was severely dilated.  4. Right atrial size was severely dilated.  5. The mitral valve is abnormal. Moderate mitral valve regurgitation. No evidence of mitral stenosis.  6. Tricuspid valve regurgitation is moderate.  7. The aortic valve is normal in structure. Aortic valve regurgitation is not  visualized. No aortic stenosis is present. Comparison(s): RVSP appears worse, LVEF and stroke volume appear slightly worse. FINDINGS  Left Ventricle: Left ventricular ejection fraction, by estimation, is 20 to 25%. The left ventricle has severely decreased function. The left ventricle demonstrates global hypokinesis. The left ventricular internal cavity size was severely dilated. There is no left ventricular hypertrophy. Left ventricular diastolic parameters are indeterminate. Right Ventricle: The right ventricular size is severely enlarged. No increase in right ventricular wall thickness. Right ventricular systolic function is low normal. There is severely elevated pulmonary artery systolic pressure. The tricuspid regurgitant  velocity is 3.69 m/s, and with an assumed right atrial pressure of 15 mmHg, the estimated right ventricular systolic pressure is 69.5 mmHg. Left Atrium: Left atrial size was severely dilated. Right Atrium: Right atrial size was severely dilated. Pericardium: There is no evidence of pericardial effusion. Mitral Valve: Normal LVCA on 2D planimetry. Blunted right sided pulmonary veins. The mitral valve is  abnormal. Moderate mitral valve regurgitation. No evidence of mitral valve stenosis. MV peak gradient, 10.5 mmHg. The mean mitral valve gradient is 5.0 mmHg. Tricuspid Valve: The tricuspid valve is normal in structure. Tricuspid valve regurgitation is moderate. Aortic Valve: The aortic valve is normal in structure. Aortic valve regurgitation is not visualized. No aortic stenosis is present. Pulmonic Valve: The pulmonic valve was normal in structure. Pulmonic valve regurgitation is mild. No evidence of pulmonic stenosis. Aorta: The aortic root and ascending aorta are structurally normal, with no evidence of dilitation. IAS/Shunts: No atrial level shunt detected by color flow Doppler. Additional Comments: A device lead is visualized in the right ventricle and right atrium.  LEFT VENTRICLE PLAX 2D LVIDd:         7.30 cm LVIDs:         6.60 cm LV PW:         0.90 cm LV IVS:        0.90 cm LVOT diam:     2.40 cm LV SV:         45 LV SV Index:   18 LVOT Area:     4.52 cm  RIGHT VENTRICLE RV Basal diam:  5.90 cm RV Mid diam:    4.20 cm RV S prime:     704.00 cm/s TAPSE (M-mode): 1.7 cm LEFT ATRIUM              Index        RIGHT ATRIUM           Index LA diam:        6.70 cm  2.62 cm/m   RA Area:     26.10 cm LA Vol (A2C):   117.0 ml 45.78 ml/m  RA Volume:   94.40 ml  36.94 ml/m LA Vol (A4C):   146.0 ml 57.13 ml/m LA Biplane Vol: 134.0 ml 52.43 ml/m  AORTIC VALVE LVOT Vmax:   69.15 cm/s LVOT Vmean:  49.900 cm/s LVOT VTI:    0.099 m  AORTA Ao Root diam: 2.90 cm Ao Asc diam:  3.00 cm MITRAL VALVE                  TRICUSPID VALVE MV Area (PHT): 5.88 cm       TR Peak grad:   54.5 mmHg MV Area VTI:   1.61 cm       TR Vmax:        369.00 cm/s MV Peak grad:  10.5 mmHg MV Mean grad:  5.0 mmHg  SHUNTS MV Vmax:       1.62 m/s       Systemic VTI:  0.10 m MV Vmean:      101.0 cm/s     Systemic Diam: 2.40 cm MV Decel Time: 129 msec MR Peak grad:    96.0 mmHg MR Mean grad:    53.0 mmHg MR Vmax:         490.00 cm/s MR  Vmean:        331.0 cm/s MR PISA:         3.08 cm MR PISA Eff ROA: 24 mm MR PISA Radius:  0.70 cm MV E velocity: 129.00 cm/s Douglas Lam MD Electronically signed by Douglas Lam MD Signature Date/Time: 10/26/2022/9:14:04 AM    Final    DG Chest 2 View  Result Date: 10/25/2022 CLINICAL DATA:  Shortness of breath EXAM: CHEST - 2 VIEW COMPARISON:  Previous studies including the examination of 10/18/2022 FINDINGS: Transverse diameter of heart is increased. Central pulmonary vessels are more prominent. Subtle increased markings are seen in parahilar regions. There is no focal consolidation. There is minimal blunting of left lateral CP angle. There is no pneumothorax. Pacemaker/defibrillator battery is seen in left infraclavicular region. IMPRESSION: Cardiomegaly. Central pulmonary vessels are more prominent suggesting CHF. Possible small left pleural effusion. Electronically Signed   By: Ernie Avena M.D.   On: 10/25/2022 09:05   DG Chest 2 View  Result Date: 10/18/2022 CLINICAL DATA:  Shortness of breath, chest pain, cough EXAM: CHEST - 2 VIEW COMPARISON:  02/28/2022 FINDINGS: Left AICD remains in place, unchanged. Cardiomegaly, vascular congestion. Mild peribronchial thickening and interstitial prominence. No effusions. No acute bony abnormality. IMPRESSION: Cardiomegaly with vascular congestion. Peribronchial thickening and interstitial prominence could reflect bronchitis, less likely interstitial edema. Electronically Signed   By: Charlett Nose M.D.   On: 10/18/2022 19:16       I have independently reviewed the above radiology studies  and reviewed the findings with the patient.   Recent Lab Findings: Lab Results  Component Value Date   WBC 7.6 11/04/2022   HGB 14.4 11/04/2022   HCT 42.2 11/04/2022   PLT 249 11/04/2022   GLUCOSE 116 (H) 11/05/2022   CHOL 192 10/06/2022   TRIG 97 10/06/2022   HDL 50 10/06/2022   LDLCALC 124 (H) 10/06/2022   ALT 22 11/02/2022   AST  19 11/02/2022   NA 136 11/05/2022   K 4.4 11/05/2022   CL 102 11/05/2022   CREATININE 1.86 (H) 11/05/2022   BUN 33 (H) 11/05/2022   CO2 24 11/05/2022   TSH 2.959 11/02/2022   INR 1.2 11/02/2022   HGBA1C 6.4 (H) 11/02/2022     Assessment / Plan:   YEHONATAN GRANDISON is a 46 y.o. male with medical history significant for hypertension, hyperlipidemia, BMI 35, cocaine abuse, chronic systolic CHF.  He fits anatomic criteria for LVAD  LViDD adequate.  RV function borderline but overall adequate - can generate high enough pressure Cr is concerning around 2 - if it gets to 2.5 that is too high for LVAD  Social situation and housing is primary concern.  Drug use seems occasional and without other sequelae and would contraindicate from OHT but not necessarily LVAD.  Housing and caregiver are primary concern and will look for social work input.  If he is not a candidate for LVAD then transcatheter mitral therapies are an option.    Clare Charon MD CV Surgery

## 2022-11-05 NOTE — Progress Notes (Signed)
Advanced Heart Failure Rounding Note  PCP-Cardiologist: None   Subjective:    Remains on milrinone 0.25. Co-ox 64%  Still mildly SOB. Scr improved with diuresis.   Has been turned down for VAD due primarily due to limited social support. Being evaluated for mTEER   Objective:   Weight Range: 121.7 kg Body mass index is 32.66 kg/m.   Vital Signs:   Temp:  [97.6 F (36.4 C)-98.7 F (37.1 C)] 98.4 F (36.9 C) (12/22 0500) Pulse Rate:  [74-87] 78 (12/22 0500) Resp:  [18-20] 18 (12/22 0500) BP: (107-124)/(74-94) 117/92 (12/22 0500) SpO2:  [92 %-98 %] 94 % (12/22 0500) Weight:  [121.7 kg] 121.7 kg (12/22 0500) Last BM Date : 11/03/22  Weight change: Filed Weights   11/03/22 0553 11/04/22 0603 11/05/22 0500  Weight: 120.5 kg 121.1 kg 121.7 kg    Intake/Output:   Intake/Output Summary (Last 24 hours) at 11/05/2022 0746 Last data filed at 11/05/2022 2542 Gross per 24 hour  Intake 2244.24 ml  Output 2900 ml  Net -655.76 ml      Physical Exam    General:  Sitting up in bed No resp difficulty HEENT: normal Neck: supple. JVP 6-7. Carotids 2+ bilat; no bruits. No lymphadenopathy or thryomegaly appreciated. Cor: PMI laterally displaced. Regular rate & rhythm. No rubs, gallops or murmurs. Lungs: clear Abdomen: soft, nontender, nondistended. No hepatosplenomegaly. No bruits or masses. Good bowel sounds. Extremities: no cyanosis, clubbing, rash, edema Neuro: alert & orientedx3, cranial nerves grossly intact. moves all 4 extremities w/o difficulty. Affect pleasant  Telemetry    SR 80s-90s Personally reviewed   Labs    CBC Recent Labs    11/02/22 1206 11/04/22 0330  WBC 9.7 7.6  NEUTROABS 5.6  --   HGB 14.6 14.4  HCT 43.6 42.2  MCV 85.0 84.6  PLT 267 249    Basic Metabolic Panel Recent Labs    70/62/37 1407 11/05/22 0512  NA 133* 136  K 3.6 4.4  CL 99 102  CO2 26 24  GLUCOSE 162* 116*  BUN 34* 33*  CREATININE 2.10* 1.86*  CALCIUM 8.7* 9.0   MG  --  2.2    Liver Function Tests Recent Labs    11/02/22 1206  AST 19  ALT 22  ALKPHOS 63  BILITOT 1.4*  PROT 6.8  ALBUMIN 2.7*    No results for input(s): "LIPASE", "AMYLASE" in the last 72 hours. Cardiac Enzymes No results for input(s): "CKTOTAL", "CKMB", "CKMBINDEX", "TROPONINI" in the last 72 hours.  BNP: BNP (last 3 results) Recent Labs    10/18/22 1821 10/25/22 1442 10/28/22 1825  BNP 1,494.7* 1,428.2* 491.6*     ProBNP (last 3 results) No results for input(s): "PROBNP" in the last 8760 hours.   D-Dimer No results for input(s): "DDIMER" in the last 72 hours. Hemoglobin A1C Recent Labs    11/02/22 1206  HGBA1C 6.4*    Fasting Lipid Panel No results for input(s): "CHOL", "HDL", "LDLCALC", "TRIG", "CHOLHDL", "LDLDIRECT" in the last 72 hours. Thyroid Function Tests Recent Labs    11/02/22 1206  TSH 2.959     Other results:   Imaging    No results found.   Medications:     Scheduled Medications:  [MAR Hold] albuterol  2.5 mg Nebulization Once   [MAR Hold] aspirin  81 mg Oral Daily   [MAR Hold] atorvastatin  80 mg Oral Daily   [MAR Hold] Chlorhexidine Gluconate Cloth  6 each Topical Daily   [MAR Hold]  dapagliflozin propanediol  10 mg Oral QAC breakfast   [MAR Hold] enoxaparin (LOVENOX) injection  60 mg Subcutaneous Q24H   [MAR Hold] hydrALAZINE  25 mg Oral Q8H   [MAR Hold] insulin aspart  0-15 Units Subcutaneous TID WC   [MAR Hold] insulin glargine-yfgn  15 Units Subcutaneous Daily   [MAR Hold] isosorbide mononitrate  30 mg Oral Daily   [MAR Hold] sodium chloride flush  10-40 mL Intracatheter Q12H   [MAR Hold] sodium chloride flush  3 mL Intravenous Q12H   [MAR Hold] spironolactone  12.5 mg Oral Daily    Infusions:  sodium chloride     sodium chloride 10 mL/hr at 11/05/22 0510   amiodarone 30 mg/hr (11/05/22 0652)   milrinone 0.25 mcg/kg/min (11/05/22 0628)    PRN Medications: sodium chloride, [MAR Hold] acetaminophen  **OR** [MAR Hold] acetaminophen, [MAR Hold] ALPRAZolam, [MAR Hold] alteplase, [MAR Hold] guaiFENesin-dextromethorphan, [MAR Hold] melatonin, [MAR Hold] polyethylene glycol, [MAR Hold] sodium chloride flush, sodium chloride flush    Patient Profile   [name] is a 46 year old with a history of HTN, hyperlipidemia, smoker, cocaine abuse,NSVT,  NICM, Medtronic ICD, and chronic HFrEF. Last used cocaine about a week ago.  EF has been down for some time.    Admitted recently with A/C HFrEF. Suspect he had residual congestion when discharged.  Readmitted 12/11 with A/C HFrEF.  Assessment/Plan   1. A/C HFrEF  - NICM. Has Medtronic ICD. 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   - Admit early December. Suspect still had volume on board at discharge + increased fluid intake leading to readmission several days later. - NYHA IV on admit - TEE this admit EF 20%, severe central MR - CO-OX 64% on milrinone 0.25 - RHC today to guide further management  - Continue spiro 12.5 mg daily - Off carvedilol with low output - Continue farxiga - Continue hydral and imdur  - Continue to hold Entresto for now with renal impairment - Has end-stage HF and appears that he is now inotrope dependent. This may be the last opportunity to consider advanced therapies including VAD. However, this decision is complicated by ongoing drug use and difficult social situation. Has been turned down for VAD due primarily due to limited social support. Being evaluated for mTEER - RHC today   2. AKI on CKD Stage IIIb - Creatinine on admit 2. Baseline previously ~ 1.6.  - Cr improved sligthly today 1.86 - Diuretics as above. RHC today - Continue inotrope support   3. Substance Abuse - Last used cocaine about a week PTA. - Discussed cessation.  - reports he is motivated to quit so he can qualify for VAD  4. CAD - LHC 02/2022 nonobstructive CAD - LDL 124  - Increased atorvastatin to 80 mg daily    5. DMII -On  SSI -Continue Farxiga   6. HTN  - BP improved   7. NSVT - Previously on amio but he stopped in April of this year.  I am not sure why he stopped.  - Frequent burst of NSVT noted on device since the beginning of December.  - Now on IV amio d/t frequent runs NSVT.  - Continue IV amio while on inotrope support, seems to be having less VT last couple of days - Keep K > 4.0 Mg > 2.0  - Off Coreg with low-output   8. Obesity  - Body mass index is 32.66 kg/m.   9. MR  - TEE 12/14: EF 20%,  Severe central MR - As above. Pursuing mTEER eval  10. Mediastinal lymph nodes, GGO RUL - Noted on preVAD CT - Will need repeat imaging in 3-6 months to follow   SDOH: -Has medicaid -Works part-time at [employer].  -SW and VAD team following to see if he would be VAD candidate. HF SW meeting with his sister today. -Currently living with Aunt and sleeps on the floor. Would need more stable living situation prior to VAD.     Length of Stay: 11  [name], MD  7:46 AM  Advanced Heart Failure Team Pager [phone] (M-F; 7a - 5p)  Please contact [facility] for night-coverage after hours (5p -7a ) and weekends on [email or web address]

## 2022-11-05 NOTE — Interval H&P Note (Signed)
History and Physical Interval Note:  11/05/2022 7:45 AM  Boss L Sissel  has presented today for surgery, with the diagnosis of hf.  The various methods of treatment have been discussed with the patient and family. After consideration of risks, benefits and other options for treatment, the patient has consented to  Procedure(s): RIGHT HEART CATH (N/A) as a surgical intervention.  The patient's history has been reviewed, patient examined, no change in status, stable for surgery.  I have reviewed the patient's chart and labs.  Questions were answered to the patient's satisfaction.     Douglas [name]

## 2022-11-05 NOTE — H&P (View-Only) (Signed)
 [facility]   MULTIDISCIPLINARY HEART VALVE TEAM  Inpatient MitraClip Consultation:   Patient ID: Douglas [name]; [id number]; 07/31/1976   Admit date: 10/25/2022 Date of Consult: 11/05/2022  Primary Care Provider: Fleming, Zelda W, NP Primary Cardiologist: Dr. Bensimhon, MD  Primary Electrophysiologist: Dr. Klein, MD   Patient Profile:   Douglas [name] is a 46 y.o. male with a hx of HTN, HLD, cocaine use/tobacco use, poor social support, HFrEF secondary to NICM s/p Medtronic ICD placement, and severe mitral regurgitation who is being seen today for the evaluation of TEER at the request of Dr. Bensoimhon.  History of Present Illness:   Douglas [name] is a 46yo M who lives here in [place]. He has been having difficulty with his living situation for quite some time and has been most recently been staying with his aunt. He continues to work full time at [employer] and has been very functional up until 3-4 weeks ago when he began having exertional SOB. He tried taking increased doses of Torsemide with no improvement in urine output or symptoms. He denies recent chest pain, LE edema, or syncope. He does report some intermittent palpitations with associated dizziness. He has not followed with a dentist on a regular basis but has not active dental issues at this time.   Douglas [name] has been followed by Dr. Bensimhon for many years dating back to 2012. He has a hx of NICM with cardiac catheterization at that time showing mild non-obstructive disease. He initially had LV recovery to 55% in 2013 but this again decreased by 2014 to 15%. He was seen by EP after a hospital admission for VT at which time an ICD was placed. He has longstanding issues with substance abuse as well as medical non-compliance.    He was recently admitted 10/18/22 with acute on chronic CHF and diuresed with IV Lasix which was subsequently transitioned to high dose torsemide prior to discharge. His Entresto was  held due to rising Cr and he was discharged 12/7. He initially felt well however began having progressive orthopnea type symptoms. He returned to the ED with an 8lb weight gain. CXR showed pulmonary edema with a BNP at 1428. Cr was elevated at 1.96. He was started on IV Lasix for residual congestion. Device interrogation showed frequent NSVT bursts and he was started on IV amiodarone. Echocardiogram showed LVEF at 20-25% severely dilated LV, RV severely enlarged with low normal function, PASP 70 mmHg, severe BASE, moderate MR, and moderate TR. He was started on Milrinone for low output and a Co-ox of 53%. He was stabilized however felt to be nearing end-stage heart failure. Advance heart failure team initially was considering him for home inotropes versus LVAD placement. He continued the work up for LVAD and was seen by Dr. Enter with TCTS. He was flet to have suitable anatomy but there was concern about his social situation with ongoing substance abuse, non-compliance, and poor/unstable living situation. After further assessment with the coordination and AHF teams, he was felt not to be a candidate for LVAD at this time. He is now being considered for transcatheter edge to edge mitral valve repair with MitraClip.   He is seen today and is doing well with no complaints. He continues to require milrinone with a most recent Co-ox at 64%. RHC today showed volume overload with low output despite milrinone support. MitraClip procedure discussed in depth with all questions answered.   Past Medical History:  Diagnosis Date   Depression, major,   single episode 01/14/2017   Diabetes mellitus (HCC)    ED (erectile dysfunction)    History of syncope 01/29/2015   Hypertension    ICD (implantable cardioverter-defibrillator) discharge 11/30/2014   On 11/30/14. Asymptomatic.    Nonischemic cardiomyopathy (HCC)    a.  echo 4/06: EF 30%, mild to mod MR, mild RAE, inf HK, lat HK , ant HK;    b.  cath 4/06: no CAD, EF 20-25%    NSVT (nonsustained ventricular tachycardia) (HCC)    Obesity    Systolic CHF, chronic (HCC)    EF 11/17 25-30%, s/p ICD    Past Surgical History:  Procedure Laterality Date   CARDIAC CATHETERIZATION  09/2011; 02/2013; 04/2013   CARDIAC DEFIBRILLATOR PLACEMENT  08/23/2013   IMPLANTABLE CARDIOVERTER DEFIBRILLATOR IMPLANT N/A 08/23/2013   Procedure: IMPLANTABLE CARDIOVERTER DEFIBRILLATOR IMPLANT;  Surgeon: Steven C Klein, MD;  Location: [facility]ATH [facility];  Service: Cardiovascular;  Laterality: N/A;   LEFT AND RIGHT HEART CATHETERIZATION WITH CORONARY ANGIOGRAM N/A 09/20/2011   Procedure: LEFT AND RIGHT HEART CATHETERIZATION WITH CORONARY ANGIOGRAM;  Surgeon: Daniel R Bensimhon, MD;  Location: [facility];  Service: Cardiovascular;  Laterality: N/A;   MULTIPLE EXTRACTIONS WITH ALVEOLOPLASTY N/A 01/26/2013   Procedure:  EXTRACION  TOOTH # 19 WITH ALVEOLOPLASTY;  Surgeon: Ronald F Kulinski, DDS;  Location: [facility] OR;  Service: Oral Surgery;  Laterality: N/A;   RIGHT HEART CATHETERIZATION N/A 02/22/2013   Procedure: RIGHT HEART CATH;  Surgeon: Daniel R Bensimhon, MD;  Location: [facility];  Service: Cardiovascular;  Laterality: N/A;   RIGHT HEART CATHETERIZATION N/A 05/03/2013   Procedure: RIGHT HEART CATH;  Surgeon: Daniel R Bensimhon, MD;  Location: [facility];  Service: Cardiovascular;  Laterality: N/A;   RIGHT/LEFT HEART CATH AND CORONARY ANGIOGRAPHY N/A 03/03/2022   Procedure: RIGHT/LEFT HEART CATH AND CORONARY ANGIOGRAPHY;  Surgeon: Bensimhon, Daniel R, MD;  Location: [facility]AS[facility];  Service: Cardiovascular;  Laterality: N/A;   TEE WITHOUT CARDIOVERSION N/A 10/28/2022   Procedure: TRANSESOPHAGEAL ECHOCARDIOGRAM (TEE);  Surgeon: Bensimhon, Daniel R, MD;  Location: [facility];  Service: Cardiovascular;  Laterality: N/A;     Inpatient Medications: Scheduled Meds:  albuterol  2.5 mg Nebulization Once   aspirin  81 mg Oral Daily   atorvastatin  80 mg Oral Daily   Chlorhexidine Gluconate Cloth  6  each Topical Daily   dapagliflozin propanediol  10 mg Oral QAC breakfast   [START ON 11/06/2022] enoxaparin (LOVENOX) injection  60 mg Subcutaneous Q24H   furosemide  80 mg Intravenous BID   hydrALAZINE  25 mg Oral Q8H   insulin aspart  0-15 Units Subcutaneous TID WC   insulin glargine-yfgn  15 Units Subcutaneous Daily   isosorbide mononitrate  30 mg Oral Daily   sodium chloride flush  10-40 mL Intracatheter Q12H   sodium chloride flush  3 mL Intravenous Q12H   sodium chloride flush  3 mL Intravenous Q12H   spironolactone  12.5 mg Oral Daily   Continuous Infusions:  sodium chloride     amiodarone 30 mg/hr (11/05/22 0652)   milrinone 0.25 mcg/kg/min (11/05/22 0628)   PRN Meds: sodium chloride, acetaminophen, ALPRAZolam, alteplase, guaiFENesin-dextromethorphan, hydrALAZINE, labetalol, melatonin, polyethylene glycol, sodium chloride flush, sodium chloride flush  Allergies:    Allergies  Allergen Reactions   Bydureon [Exenatide] Rash   Penicillins Other (See Comments)    Childhood allergy Unknown reaction    Social History:   Social History   Socioeconomic History   Marital status: Widowed      Spouse name: Not on file   Number of children: 5   Years of education: 12   Highest education level: Not on file  Occupational History   Occupation: [employer]    Comment: part-time  Tobacco Use   Smoking status: Every Day    Packs/day: 0.25    Years: 8.00    Total pack years: 2.00    Types: Cigarettes   Smokeless tobacco: Never   Tobacco comments:    ~3 cigarettes a day  Vaping Use   Vaping Use: Never used  Substance and Sexual Activity   Alcohol use: Yes    Alcohol/week: 0.0 standard drinks of alcohol    Comment: 2x week.    Drug use: Not Currently    Types: Cocaine    Comment: 08/10/17 - last use one week ago   Sexual activity: Yes  Other Topics Concern   Not on file  Social History Narrative   Referred to PCP- appointment made for 09/04/18 at 9:30am at [facility]       Provided with food pantry and free meal list- patient reported sometimes having issues paying for food but reports he does received food stamps and works part time- he does not pay for housing as he lives with his sister.      Patient uses his mother's car and reports no issues getting transport to to medical appointments.   Social Determinants of Health   Financial Resource Strain: High Risk (03/01/2022)   Overall Financial Resource Strain (CARDIA)    Difficulty of Paying Living Expenses: Hard  Food Insecurity: No Food Insecurity (03/01/2022)   Hunger Vital Sign    Worried About Running Out of Food in the Last Year: Never true    Ran Out of Food in the Last Year: Never true  Transportation Needs: No Transportation Needs (10/28/2022)   PRAPARE - Transportation    Lack of Transportation (Medical): No    Lack of Transportation (Non-Medical): No  Physical Activity: Not on file  Stress: Not on file  Social Connections: Not on file  Intimate Partner Violence: Not on file    Family History:   The patient's family history includes Coronary artery disease in his maternal uncle; Coronary artery disease (age of onset: 40) in his mother; Diabetes type II in his maternal uncle; Lung cancer in his father. There is no history of Stroke or Heart attack.  ROS:  Please see the history of present illness.  ROS  All other ROS reviewed and negative.     Physical Exam/Data:   Vitals:   11/05/22 0814 11/05/22 0819 11/05/22 0824 11/05/22 0912  BP: (!) 167/146 (!) 134/117 (!) 158/137 127/84  Pulse: 85 81 82 86  Resp: (!) 22 20 20 18  Temp:    97.6 F (36.4 C)  TempSrc:    Oral  SpO2: 91% 91% 93% 98%  Weight:      Height:        Intake/Output Summary (Last 24 hours) at 11/05/2022 1031 Last data filed at 11/05/2022 0652 Gross per 24 hour  Intake 1574.24 ml  Output 2900 ml  Net -1325.76 ml   Filed Weights   11/03/22 0553 11/04/22 0603 11/05/22 0500  Weight: 120.5 kg 121.1 kg 121.7 kg    Body mass index is 32.66 kg/m.   General: Well developed, well nourished, NAD. Neck: Negative for carotid bruits. +JVD Lungs:Clear to ausculation bilaterally. No wheezes, rales, or rhonchi. Breathing is unlabored. Cardiovascular: RRR with S1 S2. + murmur Abdomen: Soft,   non-tender, non-distended. No obvious abdominal masses. Extremities: No edema.  Neuro: Alert and oriented. No focal deficits. No facial asymmetry. MAE spontaneously. Psych: Responds to questions appropriately with normal affect.    EKG:  The EKG was personally reviewed and demonstrates:  11/01/22 NSR with HR 79bpm Telemetry:  Telemetry was personally reviewed and demonstrates:  11/05/22 NSR with HRs in the 70-100's  Relevant CV Studies:  RHC 11/05/22:  Findings:   On milrinone 0.25 mcg/kg/min   RA = 2 RV = 59/9 PA = 58/30 (44) PCW = 33 (v = 48) Fick cardiac output/index = 5.3/2.1 Thermo CO/CI = 4.4 1.8 PVR = 2.1 (Fick) 2.5 (TD) Ao sat =  93% PA sat = 59%, 63% PAPi = 14   Assessment: 1, Volume overload with low output despite milrinone support 2. Prominent v waves in PCWP tracing c/w known severe MR 3. Normal PAPi suggestive of preserved RV function    Plan/Discussion:    Continue mTEER eval     TEE 10/28/22:  FINDINGS:   LEFT VENTRICLE: Dilated EF = 20%. Global HK  RIGHT VENTRICLE: Moderately reduced   LEFT ATRIUM: Severely dilated with smoke   LEFT ATRIAL APPENDAGE: No thrombus.    RIGHT ATRIUM: Moderately dilated   AORTIC VALVE:  Trileaflet. Mildly calcified No AI   MITRAL VALVE:    Normal. Severe central functional MR with mild restriction of posterior leaflet. No significant flow reversal in pulmonary veins   TRICUSPID VALVE: Normal. Moderate TR   PULMONIC VALVE: Grossly normal. Trivial PI   INTERATRIAL SEPTUM: No PFO or ASD.   PERICARDIUM: No effusion   DESCENDING AORTA: Mild plque   Echocardiogram 10/26/22:  1. Left ventricular ejection fraction, by estimation, is 20 to  25%. The  left ventricle has severely decreased function. The left ventricle  demonstrates global hypokinesis. The left ventricular internal cavity size  was severely dilated. Left ventricular  diastolic parameters are indeterminate.   2. Right ventricular systolic function is low normal. The right  ventricular size is severely enlarged. There is severely elevated  pulmonary artery systolic pressure.   3. Left atrial size was severely dilated.   4. Right atrial size was severely dilated.   5. The mitral valve is abnormal. Moderate mitral valve regurgitation. No  evidence of mitral stenosis.   6. Tricuspid valve regurgitation is moderate.   7. The aortic valve is normal in structure. Aortic valve regurgitation is  not visualized. No aortic stenosis is present.   Laboratory Data:  Chemistry Recent Labs  Lab 11/04/22 0330 11/04/22 1407 11/05/22 0512  NA 137 133* 136  K 4.0 3.6 4.4  CL 101 99 102  CO2 26 26 24  GLUCOSE 104* 162* 116*  BUN 30* 34* 33*  CREATININE 1.99* 2.10* 1.86*  CALCIUM 8.7* 8.7* 9.0  GFRNONAA 41* 38* 44*  ANIONGAP 10 8 10    Recent Labs  Lab 11/02/22 1206  PROT 6.8  ALBUMIN 2.7*  AST 19  ALT 22  ALKPHOS 63  BILITOT 1.4*   Hematology Recent Labs  Lab 11/02/22 1206 11/04/22 0330  WBC 9.7 7.6  RBC 5.13 4.99  HGB 14.6 14.4  HCT 43.6 42.2  MCV 85.0 84.6  MCH 28.5 28.9  MCHC 33.5 34.1  RDW 13.1 13.1  PLT 267 249   Cardiac EnzymesNo results for input(s): "TROPONINI" in the last 168 hours. No results for input(s): "TROPIPOC" in the last 168 hours.  BNPNo results for input(s): "BNP", "PROBNP" in the last 168 hours.  DDimer   No results for input(s): "DDIMER" in the last 168 hours.  Radiology/Studies:  CARDIAC CATHETERIZATION  Result Date: 11/05/2022 Findings: On milrinone 0.25 mcg/kg/min RA = 2 RV = 59/9 PA = 58/30 (44) PCW = 33 (v = 48) Fick cardiac output/index = 5.3/2.1 Thermo CO/CI = 4.4 1.8 PVR = 2.1 (Fick) 2.5 (TD) Ao sat =  93% PA sat =  59%, 63% PAPi = 14 Assessment: 1, Volume overload with low output despite milrinone support 2. Prominent v waves in PCWP tracing c/w known severe MR 3. Normal PAPi suggestive of preserved RV function Plan/Discussion: Continue mTEER eval Daniel Bensimhon, MD 8:32 AM  VAS US DOPPLER PRE VAD  Result Date: 11/03/2022 PERIOPERATIVE VASCULAR EVALUATION Patient Name:  Douglas [name]  Date of Exam:   11/03/2022 Medical Rec #: [id number]          Accession #:    [id number] Date of Birth: 06/17/1976         Patient Gender: M Patient Age:   46 years Exam Location:  [facility] Procedure:      VAS US DOPPLER PRE VAD Referring Phys: DANIEL BENSIMHON --------------------------------------------------------------------------------  Indications:  Pre-VAD workup. Risk Factors: Hypertension, hyperlipidemia, Diabetes, current smoker, coronary               artery disease. Performing Technologist: Hill, Jody RVT, RDMS Supporting Technologist: Rachel Hodge RDMS, RVT  Examination Guidelines: A complete evaluation includes B-mode imaging, spectral Doppler, color Doppler, and power Doppler as needed of all accessible portions of each vessel. Bilateral testing is considered an integral part of a complete examination. Limited examinations for reoccurring indications may be performed as noted.  Right Carotid Findings: +----------+--------+--------+--------+--------+------------------+           PSV cm/sEDV cm/sStenosisDescribeComments           +----------+--------+--------+--------+--------+------------------+ CCA Prox  85      15                                         +----------+--------+--------+--------+--------+------------------+ CCA Distal70      27                      intimal thickening +----------+--------+--------+--------+--------+------------------+ ICA Prox  33      14                                         +----------+--------+--------+--------+--------+------------------+ ICA  Distal56      18                                         +----------+--------+--------+--------+--------+------------------+ ECA       109     19                                         +----------+--------+--------+--------+--------+------------------+ +----------+--------+-------+----------------+------------+           PSV cm/sEDV cmsDescribe        Arm Pressure +----------+--------+-------+----------------+------------+ Subclavian102            Multiphasic, WNL             +----------+--------+-------+----------------+------------+ +---------+--------+--+--------+-+---------+   VertebralPSV cm/s25EDV cm/s8Antegrade +---------+--------+--+--------+-+---------+ Left Carotid Findings: +----------+--------+--------+--------+--------+------------------+           PSV cm/sEDV cm/sStenosisDescribeComments           +----------+--------+--------+--------+--------+------------------+ CCA Prox  103     17                                         +----------+--------+--------+--------+--------+------------------+ CCA Distal71      19                      intimal thickening +----------+--------+--------+--------+--------+------------------+ ICA Prox  55      19                                         +----------+--------+--------+--------+--------+------------------+ ICA Distal51      22                                         +----------+--------+--------+--------+--------+------------------+ ECA       84      11                                         +----------+--------+--------+--------+--------+------------------+ +----------+--------+--------+----------------+------------+ SubclavianPSV cm/sEDV cm/sDescribe        Arm Pressure +----------+--------+--------+----------------+------------+           106             Multiphasic, WNL136          +----------+--------+--------+----------------+------------+  +---------+--------+--+--------+--+---------+ VertebralPSV cm/s27EDV cm/s11Antegrade +---------+--------+--+--------+--+---------+  ABI Findings: +---------+------------------+-----+---------+-----------------------------+ Right    Rt Pressure (mmHg)IndexWaveform Comment                       +---------+------------------+-----+---------+-----------------------------+ Brachial                        triphasicLine in right upper extremity +---------+------------------+-----+---------+-----------------------------+ PTA      151               1.11 triphasic                              +---------+------------------+-----+---------+-----------------------------+ DP       159               1.17 triphasic                              +---------+------------------+-----+---------+-----------------------------+ Great Toe                       Normal                                 +---------+------------------+-----+---------+-----------------------------+ +---------+------------------+-----+---------+-------+ Left     Lt Pressure (mmHg)IndexWaveform Comment +---------+------------------+-----+---------+-------+ Brachial 136                    triphasic        +---------+------------------+-----+---------+-------+ PTA        142               1.04 triphasic        +---------+------------------+-----+---------+-------+ DP       140               1.03 triphasic        +---------+------------------+-----+---------+-------+ Great Toe111                    Normal           +---------+------------------+-----+---------+-------+  Summary: Right Carotid: The extracranial vessels were near-normal with only minimal wall                thickening or plaque. Left Carotid: The extracranial vessels were near-normal with only minimal wall               thickening or plaque. Vertebrals:  Bilateral vertebral arteries demonstrate antegrade flow. Subclavians: Normal flow  hemodynamics were seen in bilateral subclavian              arteries.  *See table(s) above for measurements and observations. Right ABI: Resting right ankle-brachial index is within normal range. The right toe-brachial index is normal. Left ABI: Resting left ankle-brachial index is within normal range. The left toe-brachial index is normal.  Electronically signed by Christopher Clark MD on 11/03/2022 at 4:30:11 PM.    Final    VAS US LOWER EXTREMITY VENOUS (DVT)  Result Date: 11/03/2022  Lower Venous DVT Study Patient Name:  Douglas [name]  Date of Exam:   11/03/2022 Medical Rec #: [id number]          Accession #:    [id number] Date of Birth: 07/26/1976         Patient Gender: M Patient Age:   46 years Exam Location:  [facility] Procedure:      VAS US LOWER EXTREMITY VENOUS (DVT) Referring Phys: DANIEL BENSIMHON --------------------------------------------------------------------------------  Indications: Pre-VAD workup.  Comparison Study: No prior studies. Performing Technologist: Rachel Hodge RDMS, RVT  Examination Guidelines: A complete evaluation includes B-mode imaging, spectral Doppler, color Doppler, and power Doppler as needed of all accessible portions of each vessel. Bilateral testing is considered an integral part of a complete examination. Limited examinations for reoccurring indications may be performed as noted. The reflux portion of the exam is performed with the patient in reverse Trendelenburg.  +---------+---------------+---------+-----------+----------+--------------+ RIGHT    CompressibilityPhasicitySpontaneityPropertiesThrombus Aging +---------+---------------+---------+-----------+----------+--------------+ CFV      Full           Yes      Yes                                 +---------+---------------+---------+-----------+----------+--------------+ SFJ      Full                                                         +---------+---------------+---------+-----------+----------+--------------+ FV Prox  Full                                                        +---------+---------------+---------+-----------+----------+--------------+ FV Mid     Full                                                        +---------+---------------+---------+-----------+----------+--------------+ FV DistalFull                                                        +---------+---------------+---------+-----------+----------+--------------+ PFV      Full                                                        +---------+---------------+---------+-----------+----------+--------------+ POP      Full           Yes      Yes                                 +---------+---------------+---------+-----------+----------+--------------+ PTV      Full                                                        +---------+---------------+---------+-----------+----------+--------------+ PERO     Full                                                        +---------+---------------+---------+-----------+----------+--------------+   +---------+---------------+---------+-----------+----------+--------------+ LEFT     CompressibilityPhasicitySpontaneityPropertiesThrombus Aging +---------+---------------+---------+-----------+----------+--------------+ CFV      Full           Yes      Yes                                 +---------+---------------+---------+-----------+----------+--------------+ SFJ      Full                                                        +---------+---------------+---------+-----------+----------+--------------+ FV Prox  Full                                                        +---------+---------------+---------+-----------+----------+--------------+ FV Mid   Full                                                         +---------+---------------+---------+-----------+----------+--------------+   FV DistalFull                                                        +---------+---------------+---------+-----------+----------+--------------+ PFV      Full                                                        +---------+---------------+---------+-----------+----------+--------------+ POP      Full           Yes      Yes                                 +---------+---------------+---------+-----------+----------+--------------+ PTV      Full                                                        +---------+---------------+---------+-----------+----------+--------------+ PERO     Full                                                        +---------+---------------+---------+-----------+----------+--------------+     Summary: RIGHT: - There is no evidence of deep vein thrombosis in the lower extremity.  - No cystic structure found in the popliteal fossa.  LEFT: - There is no evidence of deep vein thrombosis in the lower extremity.  - No cystic structure found in the popliteal fossa.  *See table(s) above for measurements and observations. Electronically signed by Christopher Clark MD on 11/03/2022 at 4:28:34 PM.    Final    CT CHEST ABDOMEN PELVIS WO CONTRAST  Result Date: 11/02/2022 CLINICAL DATA:  LVAD evaluation; preop r/u for surgical contraindication EXAM: CT CHEST, ABDOMEN AND PELVIS WITHOUT CONTRAST TECHNIQUE: Multidetector CT imaging of the chest, abdomen and pelvis was performed following the standard protocol without IV contrast. RADIATION DOSE REDUCTION: This exam was performed according to the departmental dose-optimization program which includes automated exposure control, adjustment of the mA and/or kV according to patient size and/or use of iterative reconstruction technique. COMPARISON:  None Available. FINDINGS: CT CHEST FINDINGS Cardiovascular: Left chest wall cardiac defibrillator.  Right PICC with tip terminating in the distal the superior vena cava. No significant pericardial effusion. The thoracic aorta is normal in caliber. No atherosclerotic plaque of the thoracic aorta. No coronary artery calcifications. The main pulmonary artery is enlarged in caliber measuring up to 3.9 cm. Mediastinum/Nodes: Multiple prominent mediastinal lymph nodes as well as enlarged precarinal lymph node measuring 1.3 cm (3:25). Question right hilar lymphadenopathy (3:28), noting limited sensitivity for the detection of hilar adenopathy on this noncontrast study. No axillary lymph nodes. Thyroid gland, trachea, and esophagus demonstrate no significant findings. Lungs/Pleura: Patchy bilateral ground-glass airspace opacities most prominent within the right upper lobe measuring up to 1.2 x 1.2 cm (4:30). No pulmonary mass. No pleural   effusion. No pneumothorax. Musculoskeletal: No chest wall abnormality. No suspicious lytic or blastic osseous lesions. No acute displaced fracture. Multilevel degenerative changes of the spine. CT ABDOMEN PELVIS FINDINGS Hepatobiliary: No focal liver abnormality. No gallstones, gallbladder wall thickening, or pericholecystic fluid. No biliary dilatation. Pancreas: No focal lesion. Normal pancreatic contour. No surrounding inflammatory changes. No main pancreatic ductal dilatation. Spleen: Normal in size without focal abnormality. Adrenals/Urinary Tract: No adrenal nodule bilaterally. No nephrolithiasis and no hydronephrosis. No definite contour-deforming renal mass. No ureterolithiasis or hydroureter. The urinary bladder is unremarkable. Stomach/Bowel: Stomach is within normal limits. No evidence of bowel wall thickening or dilatation. Appendix appears normal. Vascular/Lymphatic: No abdominal aorta or iliac aneurysm. No abdominal, pelvic, or inguinal lymphadenopathy. Reproductive: Prostate is unremarkable. Other: No intraperitoneal free fluid. No intraperitoneal free gas. No organized  fluid collection. Musculoskeletal: Small fat containing umbilical hernia. Bilateral small inguinal hernias. Subcutaneus soft tissue edema along the left anterior abdomen likely due to medication injection. No suspicious lytic or blastic osseous lesions. No acute displaced fracture. Multilevel degenerative changes of the spine. IMPRESSION: 1. Patchy bilateral ground-glass airspace opacities most prominent within the right upper lobe measuring up to 1.2 x 1.2 cm Findings suggestive of infection/inflammation. Recommend follow-up CT in 3 months to evaluate for resolution. 2. Multiple prominent mediastinal lymph nodes as well as enlarged precarinal lymph node measuring 1.3 cm. Question right hilar lymphadenopathy, noting limited sensitivity for the detection of hilar adenopathy on this noncontrast study. Findings may be reactive in etiology. 3. Cardiomegaly. 4. Enlarged main pulmonary artery suggestive of pulmonary hypertension. 5. Small fat containing umbilical hernia. Bilateral small inguinal hernias. 6. No acute intra-abdominal or intrapelvic abnormality with limited evaluation on this noncontrast study. Electronically Signed   By: Morgane  Naveau M.D.   On: 11/02/2022 21:35   DG Orthopantogram  Result Date: 11/02/2022 CLINICAL DATA:  Preop. EXAM: ORTHOPANTOGRAM/PANORAMIC COMPARISON:  January 24, 2013. FINDINGS: No fracture or lytic area is seen involving the mandible. Several missing teeth are noted. IMPRESSION: No significant abnormality seen involving the mandible. Electronically Signed   By: James  Green Jr M.D.   On: 11/02/2022 16:45     STS Risk Calculator:  MVRepair/replacement.   Procedure Type: Isolated MVR Perioperative Outcome Estimate % Operative Mortality 4.53% Morbidity & Mortality 28.8% Stroke 0.979% Renal Failure 3.74% Reoperation 8.88% Prolonged Ventilation 21.8% Deep Sternal Wound Infection 0.222% Long Hospital Stay (>14 days) 20.4% Short Hospital Stay (<6  days)* 10.5%  Procedure Type: Isolated MVr Perioperative Outcome Estimate % Operative Mortality 3.91% Morbidity & Mortality 21.5% Stroke 0.007% Renal Failure 3.1% Reoperation 6.48% Prolonged Ventilation 19.2% Deep Sternal Wound Infection 0.1% Long Hospital Stay (>14 days) 11.2% Short Hospital Stay (<6 days)* 24.8%  [place] Cardiomyopathy Questionnaire     11/05/2022   12:40 PM  [facility]Q-12  1 a. Ability to shower/bathe Quite a bit limited  1 b. Ability to walk 1 block Extremely limited  1 c. Ability to hurry/jog Extremely limited  2. Edema feet/ankles/legs 1-2 times a week  3. Limited by fatigue Several times a day  4. Limited by dyspnea Several times a day  5. Sitting up / on 3+ pillows Every night  6. Limited enjoyment of life Limited quite a bit  7. Rest of life w/ symptoms Mostly dissatisfied  8 a. Participation in hobbies Severely limited  8 b. Participation in chores Severely limited  8 c. Visiting family/friends Limited quite a bit     Assessment and Plan:   Douglas [name] is a 46 y.o.   male with symptoms of severe, stage D mitral regurgitation with NYHA Class III symptoms. I have reviewed the patient's recent echocardiogram which is notable for severe LV systolic function with an LVEF at 20-25% with severely dilated LV, RV severely enlarged with low normal function, PASP 70 mmHg, severe BASE, moderate MR, and moderate TR. Subsequent TEE showed severe central functional MR with mild restriction of posterior leaflet and no significant flow reversal in pulmonary veins. TEE sent for industry review which shows LA dimensions  large enough for device steering and straddle. MR felt to be  secondary to restricted posterior leaflet and lack of central coaptation. Posterior leaflet measures between 1.3-1.6cm in the LVOT views with an MVA at 9.36cm2, mean gradient at 4mnHg at a HR of 102bpm.   I have reviewed the natural history of mitral regurgitation with the patient. We have  discussed the limitations of medical therapy and the poor prognosis associated with symptomatic mitral regurgitation. We have also reviewed potential treatment options, including palliative medical therapy, conventional surgical mitral valve repair or replacement, and percutaneous mitral valve repair with MitraClip. We discussed treatment options in the context of this patient's specific comorbid medical conditions.    The patient's predicted risk of mortality with conventional mitral valve replacement/repair is 4.53 % / 3.91 % respectively, primarily based on hx of NICM, CKD, DM2 with insulin dependence, and recurrent VT. Other significant comorbid conditions obesity, medical non-compliance, illicit drug abuse, and poor social situation.   Given poor candidacy for LVAD/ home advanced therapies, with AHF discussion, plan will be for inpatient MitraClip 11/09/22 with Dr. Lorenz Donley and Dr. Thukkani.   MD to follow with final recommendations.   Signed, Jill McDaniel, NP  11/05/2022 10:31 AM  

## 2022-11-05 NOTE — Progress Notes (Signed)
3rd call made to Phlebotomy using [phone] (2nd number attempted).  No answer.  Bedside RN Linward Foster asked charge RN for butterfly collection kit to perform collection bedside.   RN Linward Foster informed that the [facility] Unit does not carry collection supplies of that type and it would need to be brought up from Phlebotomy.

## 2022-11-05 NOTE — Progress Notes (Signed)
Collection in chart changed from "Nurse Draw" to "Lab Draw".  Phlebotomy has come bedside to collect labs.

## 2022-11-05 NOTE — Progress Notes (Signed)
   11/05/22 1500  Mobility  Activity Ambulated with assistance in hallway  Level of Assistance Contact guard assist, steadying assist  Assistive Device None  Distance Ambulated (ft) 490 ft  Activity Response Tolerated well  Mobility Referral Yes  $Mobility charge 1 Mobility   Mobility Specialist Progress Note  Pre-Mobility: 95HR, 131/75 BP  Pt in bed and agreeable. Had no c/o pain throughout ambulation. Returned to bed w/ all needs met and call bell in reach.   Lucious Groves Mobility Specialist  Please contact via SecureChat or Rehab office at [phone]

## 2022-11-05 NOTE — Progress Notes (Signed)
2nd call made to Phlebotomy.  No answer.  Will attempt a different number.

## 2022-11-05 NOTE — Progress Notes (Signed)
For patient CP: This is secondary MR    This looks like a suitable valve for a MitraClip. The fossa looks approachable for transseptal puncture in the SAXB and Bicaval views. LA dimensions are large enough for device steering and straddle. The MR is caused by a restricted posterior leaflet & lack of central coaptation.  Posterior leaflet measures between 1.3-1.6cm in the LVOT views.  MVA is 9.36cm2 & gradient is at a HR of 102bpm(this gradient may be flow related). I'd like to verify both in the procedure prior to the start.  Based on this information, I would start with a XTW placed at A2/P2 & reassess for gradient & residual.   *Pacer leads & TR noted

## 2022-11-05 NOTE — Consult Note (Addendum)
[facility]   MULTIDISCIPLINARY HEART VALVE TEAM  Inpatient MitraClip Consultation:   Patient ID: Douglas [name]; [id number]; [birth year]   Admit date: 10/25/2022 Date of Consult: 11/05/2022  Primary Care Provider: Claiborne Rigg, NP Primary Cardiologist: Dr. [name], MD  Primary Electrophysiologist: Dr. Graciela Husbands, MD   Patient Profile:   Douglas [name] is a 46 y.o. male with a hx of HTN, HLD, cocaine use/tobacco use, poor social support, HFrEF secondary to NICM s/p Medtronic ICD placement, and severe mitral regurgitation who is being seen today for the evaluation of TEER at the request of Dr. Rolinda Roan.  History of Present Illness:   Douglas [name] is a 46yo M who lives here in [place]. He has been having difficulty with his living situation for quite some time and has been most recently been staying with his aunt. He continues to work full time at [employer] and has been very functional up until 3-4 weeks ago when he began having exertional SOB. He tried taking increased doses of Torsemide with no improvement in urine output or symptoms. He denies recent chest pain, LE edema, or syncope. He does report some intermittent palpitations with associated dizziness. He has not followed with a dentist on a regular basis but has not active dental issues at this time.   Douglas [name] has been followed by Dr. [name] for many years dating back to 2012. He has a hx of NICM with cardiac catheterization at that time showing mild non-obstructive disease. He initially had LV recovery to 55% in 2013 but this again decreased by 2014 to 15%. He was seen by EP after a hospital admission for VT at which time an ICD was placed. He has longstanding issues with substance abuse as well as medical non-compliance.    He was recently admitted 10/18/22 with acute on chronic CHF and diuresed with IV Lasix which was subsequently transitioned to high dose torsemide prior to discharge. His Sherryll Burger was  held due to rising Cr and he was discharged 12/7. He initially felt well however began having progressive orthopnea type symptoms. He returned to the ED with an 8lb weight gain. CXR showed pulmonary edema with a BNP at 1428. Cr was elevated at 1.96. He was started on IV Lasix for residual congestion. Device interrogation showed frequent NSVT bursts and he was started on IV amiodarone. Echocardiogram showed LVEF at 20-25% severely dilated LV, RV severely enlarged with low normal function, PASP 70 mmHg, severe BASE, moderate MR, and moderate TR. He was started on Milrinone for low output and a Co-ox of 53%. He was stabilized however felt to be nearing end-stage heart failure. Advance heart failure team initially was considering him for home inotropes versus LVAD placement. He continued the work up for LVAD and was seen by Dr. Delia Chimes with TCTS. He was flet to have suitable anatomy but there was concern about his social situation with ongoing substance abuse, non-compliance, and poor/unstable living situation. After further assessment with the coordination and AHF teams, he was felt not to be a candidate for LVAD at this time. He is now being considered for transcatheter edge to edge mitral valve repair with MitraClip.   He is seen today and is doing well with no complaints. He continues to require milrinone with a most recent Co-ox at 64%. RHC today showed volume overload with low output despite milrinone support. MitraClip procedure discussed in depth with all questions answered.   Past Medical History:  Diagnosis Date   Depression, major,  single episode 01/14/2017   Diabetes mellitus [facility])    ED (erectile dysfunction)    History of syncope 01/29/2015   Hypertension    ICD (implantable cardioverter-defibrillator) discharge 11/30/2014   On 11/30/14. Asymptomatic.    Nonischemic cardiomyopathy (HCC)    a.  echo 4/06: EF 30%, mild to mod MR, mild RAE, inf HK, lat HK , ant HK;    b.  cath 4/06: no CAD, EF 20-25%    NSVT (nonsustained ventricular tachycardia) (HCC)    Obesity    Systolic CHF, chronic (HCC)    EF 11/17 25-30%, s/p ICD    Past Surgical History:  Procedure Laterality Date   CARDIAC CATHETERIZATION  09/2011; 02/2013; 04/2013   CARDIAC DEFIBRILLATOR PLACEMENT  08/23/2013   IMPLANTABLE CARDIOVERTER DEFIBRILLATOR IMPLANT N/A 08/23/2013   Procedure: IMPLANTABLE CARDIOVERTER DEFIBRILLATOR IMPLANT;  Surgeon: Duke Salvia, MD;  Location: [facility] CATH LAB;  Service: Cardiovascular;  Laterality: N/A;   LEFT AND RIGHT HEART CATHETERIZATION WITH CORONARY ANGIOGRAM N/A 09/20/2011   Procedure: LEFT AND RIGHT HEART CATHETERIZATION WITH CORONARY ANGIOGRAM;  Surgeon: Dolores Patty, MD;  Location: [facility] CATH LAB;  Service: Cardiovascular;  Laterality: N/A;   MULTIPLE EXTRACTIONS WITH ALVEOLOPLASTY N/A 01/26/2013   Procedure:  EXTRACION  TOOTH # 19 WITH ALVEOLOPLASTY;  Surgeon: Charlynne Pander, DDS;  Location: [facility] OR;  Service: Oral Surgery;  Laterality: N/A;   RIGHT HEART CATHETERIZATION N/A 02/22/2013   Procedure: RIGHT HEART CATH;  Surgeon: Dolores Patty, MD;  Location: [facility] CATH LAB;  Service: Cardiovascular;  Laterality: N/A;   RIGHT HEART CATHETERIZATION N/A 05/03/2013   Procedure: RIGHT HEART CATH;  Surgeon: Dolores Patty, MD;  Location: [facility] CATH LAB;  Service: Cardiovascular;  Laterality: N/A;   RIGHT/LEFT HEART CATH AND CORONARY ANGIOGRAPHY N/A 03/03/2022   Procedure: RIGHT/LEFT HEART CATH AND CORONARY ANGIOGRAPHY;  Surgeon: Dolores Patty, MD;  Location: [facility] INVASIVE CV LAB;  Service: Cardiovascular;  Laterality: N/A;   TEE WITHOUT CARDIOVERSION N/A 10/28/2022   Procedure: TRANSESOPHAGEAL ECHOCARDIOGRAM (TEE);  Surgeon: Dolores Patty, MD;  Location: [facility] ENDOSCOPY;  Service: Cardiovascular;  Laterality: N/A;     Inpatient Medications: Scheduled Meds:  albuterol  2.5 mg Nebulization Once   aspirin  81 mg Oral Daily   atorvastatin  80 mg Oral Daily   Chlorhexidine Gluconate Cloth  6  each Topical Daily   dapagliflozin propanediol  10 mg Oral QAC breakfast   [START ON 11/06/2022] enoxaparin (LOVENOX) injection  60 mg Subcutaneous Q24H   furosemide  80 mg Intravenous BID   hydrALAZINE  25 mg Oral Q8H   insulin aspart  0-15 Units Subcutaneous TID WC   insulin glargine-yfgn  15 Units Subcutaneous Daily   isosorbide mononitrate  30 mg Oral Daily   sodium chloride flush  10-40 mL Intracatheter Q12H   sodium chloride flush  3 mL Intravenous Q12H   sodium chloride flush  3 mL Intravenous Q12H   spironolactone  12.5 mg Oral Daily   Continuous Infusions:  sodium chloride     amiodarone 30 mg/hr (11/05/22 0652)   milrinone 0.25 mcg/kg/min (11/05/22 0628)   PRN Meds: sodium chloride, acetaminophen, ALPRAZolam, alteplase, guaiFENesin-dextromethorphan, hydrALAZINE, labetalol, melatonin, polyethylene glycol, sodium chloride flush, sodium chloride flush  Allergies:    Allergies  Allergen Reactions   Bydureon [Exenatide] Rash   Penicillins Other (See Comments)    Childhood allergy Unknown reaction    Social History:   Social History   Socioeconomic History   Marital status: Widowed  Spouse name: Not on file   Number of children: 5   Years of education: 15   Highest education level: Not on file  Occupational History   Occupation: [employer]    Comment: part-time  Tobacco Use   Smoking status: Every Day    Packs/day: 0.25    Years: 8.00    Total pack years: 2.00    Types: Cigarettes   Smokeless tobacco: Never   Tobacco comments:    ~3 cigarettes a day  Vaping Use   Vaping Use: Never used  Substance and Sexual Activity   Alcohol use: Yes    Alcohol/week: 0.0 standard drinks of alcohol    Comment: 2x week.    Drug use: Not Currently    Types: Cocaine    Comment: 08/10/17 - last use one week ago   Sexual activity: Yes  Other Topics Concern   Not on file  Social History Narrative   Referred to PCP- appointment made for 09/04/18 at 9:30am at [facility]       Provided with food pantry and free meal list- patient reported sometimes having issues paying for food but reports he does received food stamps and works part time- he does not pay for housing as he lives with his sister.      Patient uses his mother's car and reports no issues getting transport to to medical appointments.   Social Determinants of Health   Financial Resource Strain: High Risk (03/01/2022)   Overall Financial Resource Strain (CARDIA)    Difficulty of Paying Living Expenses: Hard  Food Insecurity: No Food Insecurity (03/01/2022)   Hunger Vital Sign    Worried About Running Out of Food in the Last Year: Never true    Ran Out of Food in the Last Year: Never true  Transportation Needs: No Transportation Needs (10/28/2022)   PRAPARE - Administrator, Civil Service (Medical): No    Lack of Transportation (Non-Medical): No  Physical Activity: Not on file  Stress: Not on file  Social Connections: Not on file  Intimate Partner Violence: Not on file    Family History:   The patient's family history includes Coronary artery disease in his maternal uncle; Coronary artery disease (age of onset: 51) in his mother; Diabetes type II in his maternal uncle; Lung cancer in his father. There is no history of Stroke or Heart attack.  ROS:  Please see the history of present illness.  ROS  All other ROS reviewed and negative.     Physical Exam/Data:   Vitals:   11/05/22 0814 11/05/22 0819 11/05/22 0824 11/05/22 0912  BP: (!) 167/146 (!) 134/117 (!) 158/137 127/84  Pulse: 85 81 82 86  Resp: (!) 22 20 20 18   Temp:    97.6 F (36.4 C)  TempSrc:    Oral  SpO2: 91% 91% 93% 98%  Weight:      Height:        Intake/Output Summary (Last 24 hours) at 11/05/2022 1031 Last data filed at 11/05/2022 1610 Gross per 24 hour  Intake 1574.24 ml  Output 2900 ml  Net -1325.76 ml   Filed Weights   11/03/22 0553 11/04/22 0603 11/05/22 0500  Weight: 120.5 kg 121.1 kg 121.7 kg    Body mass index is 32.66 kg/m.   General: Well developed, well nourished, NAD. Neck: Negative for carotid bruits. +JVD Lungs:Clear to ausculation bilaterally. No wheezes, rales, or rhonchi. Breathing is unlabored. Cardiovascular: RRR with S1 S2. + murmur Abdomen: Soft,  non-tender, non-distended. No obvious abdominal masses. Extremities: No edema.  Neuro: Alert and oriented. No focal deficits. No facial asymmetry. MAE spontaneously. Psych: Responds to questions appropriately with normal affect.    EKG:  The EKG was personally reviewed and demonstrates:  11/01/22 NSR with HR 79bpm Telemetry:  Telemetry was personally reviewed and demonstrates:  11/05/22 NSR with HRs in the 70-100's  Relevant CV Studies:  RHC 11/05/22:  Findings:   On milrinone 0.25 mcg/kg/min   RA = 2 RV = 59/9 PA = 58/30 (44) PCW = 33 (v = 48) Fick cardiac output/index = 5.3/2.1 Thermo CO/CI = 4.4 1.8 PVR = 2.1 (Fick) 2.5 (TD) Ao sat =  93% PA sat = 59%, 63% PAPi = 14   Assessment: 1, Volume overload with low output despite milrinone support 2. Prominent v waves in PCWP tracing c/w known severe MR 3. Normal PAPi suggestive of preserved RV function    Plan/Discussion:    Continue mTEER eval     TEE 10/28/22:  FINDINGS:   LEFT VENTRICLE: Dilated EF = 20%. Global HK  RIGHT VENTRICLE: Moderately reduced   LEFT ATRIUM: Severely dilated with smoke   LEFT ATRIAL APPENDAGE: No thrombus.    RIGHT ATRIUM: Moderately dilated   AORTIC VALVE:  Trileaflet. Mildly calcified No AI   MITRAL VALVE:    Normal. Severe central functional MR with mild restriction of posterior leaflet. No significant flow reversal in pulmonary veins   TRICUSPID VALVE: Normal. Moderate TR   PULMONIC VALVE: Grossly normal. Trivial PI   INTERATRIAL SEPTUM: No PFO or ASD.   PERICARDIUM: No effusion   DESCENDING AORTA: Mild plque   Echocardiogram 10/26/22:  1. Left ventricular ejection fraction, by estimation, is 20 to  25%. The  left ventricle has severely decreased function. The left ventricle  demonstrates global hypokinesis. The left ventricular internal cavity size  was severely dilated. Left ventricular  diastolic parameters are indeterminate.   2. Right ventricular systolic function is low normal. The right  ventricular size is severely enlarged. There is severely elevated  pulmonary artery systolic pressure.   3. Left atrial size was severely dilated.   4. Right atrial size was severely dilated.   5. The mitral valve is abnormal. Moderate mitral valve regurgitation. No  evidence of mitral stenosis.   6. Tricuspid valve regurgitation is moderate.   7. The aortic valve is normal in structure. Aortic valve regurgitation is  not visualized. No aortic stenosis is present.   Laboratory Data:  Chemistry Recent Labs  Lab 11/04/22 0330 11/04/22 1407 11/05/22 0512  NA 137 133* 136  K 4.0 3.6 4.4  CL 101 99 102  CO2 26 26 24   GLUCOSE 104* 162* 116*  BUN 30* 34* 33*  CREATININE 1.99* 2.10* 1.86*  CALCIUM 8.7* 8.7* 9.0  GFRNONAA 41* 38* 44*  ANIONGAP 10 8 10     Recent Labs  Lab 11/02/22 1206  PROT 6.8  ALBUMIN 2.7*  AST 19  ALT 22  ALKPHOS 63  BILITOT 1.4*   Hematology Recent Labs  Lab 11/02/22 1206 11/04/22 0330  WBC 9.7 7.6  RBC 5.13 4.99  HGB 14.6 14.4  HCT 43.6 42.2  MCV 85.0 84.6  MCH 28.5 28.9  MCHC 33.5 34.1  RDW 13.1 13.1  PLT 267 249   Cardiac EnzymesNo results for input(s): "TROPONINI" in the last 168 hours. No results for input(s): "TROPIPOC" in the last 168 hours.  BNPNo results for input(s): "BNP", "PROBNP" in the last 168 hours.  DDimer  No results for input(s): "DDIMER" in the last 168 hours.  Radiology/Studies:  CARDIAC CATHETERIZATION  Result Date: 11/05/2022 Findings: On milrinone 0.25 mcg/kg/min RA = 2 RV = 59/9 PA = 58/30 (44) PCW = 33 (v = 48) Fick cardiac output/index = 5.3/2.1 Thermo CO/CI = 4.4 1.8 PVR = 2.1 (Fick) 2.5 (TD) Ao sat =  93% PA sat =  59%, 63% PAPi = 14 Assessment: 1, Volume overload with low output despite milrinone support 2. Prominent v waves in PCWP tracing c/w known severe MR 3. Normal PAPi suggestive of preserved RV function Plan/Discussion: Continue mTEER eval [name], MD 8:32 AM  VAS US DOPPLER PRE VAD  Result Date: 11/03/2022 PERIOPERATIVE VASCULAR EVALUATION Patient Name:  Douglas [name]  Date of Exam:   11/03/2022 Medical Rec #: [id number]          Accession #:    [id number] Date of Birth: 1976/06/08         Patient Gender: M Patient Age:   30 years Exam Location:  [facility] Procedure:      VAS US DOPPLER PRE VAD Referring Phys: Reuel Boom BENSIMHON --------------------------------------------------------------------------------  Indications:  Pre-VAD workup. Risk Factors: Hypertension, hyperlipidemia, Diabetes, current smoker, coronary               artery disease. Performing Technologist: Ernestene Mention RVT, RDMS Supporting Technologist: Jean Rosenthal RDMS, RVT  Examination Guidelines: A complete evaluation includes B-mode imaging, spectral Doppler, color Doppler, and power Doppler as needed of all accessible portions of each vessel. Bilateral testing is considered an integral part of a complete examination. Limited examinations for reoccurring indications may be performed as noted.  Right Carotid Findings: +----------+--------+--------+--------+--------+------------------+           PSV cm/sEDV cm/sStenosisDescribeComments           +----------+--------+--------+--------+--------+------------------+ CCA Prox  85      15                                         +----------+--------+--------+--------+--------+------------------+ CCA Distal70      27                      intimal thickening +----------+--------+--------+--------+--------+------------------+ ICA Prox  33      14                                         +----------+--------+--------+--------+--------+------------------+ ICA  Distal56      18                                         +----------+--------+--------+--------+--------+------------------+ ECA       109     19                                         +----------+--------+--------+--------+--------+------------------+ +----------+--------+-------+----------------+------------+           PSV cm/sEDV cmsDescribe        Arm Pressure +----------+--------+-------+----------------+------------+ Subclavian102            Multiphasic, WNL             +----------+--------+-------+----------------+------------+ +---------+--------+--+--------+-+---------+  VertebralPSV cm/s25EDV cm/s8Antegrade +---------+--------+--+--------+-+---------+ Left Carotid Findings: +----------+--------+--------+--------+--------+------------------+           PSV cm/sEDV cm/sStenosisDescribeComments           +----------+--------+--------+--------+--------+------------------+ CCA Prox  103     17                                         +----------+--------+--------+--------+--------+------------------+ CCA Distal71      19                      intimal thickening +----------+--------+--------+--------+--------+------------------+ ICA Prox  55      19                                         +----------+--------+--------+--------+--------+------------------+ ICA Distal51      22                                         +----------+--------+--------+--------+--------+------------------+ ECA       84      11                                         +----------+--------+--------+--------+--------+------------------+ +----------+--------+--------+----------------+------------+ SubclavianPSV cm/sEDV cm/sDescribe        Arm Pressure +----------+--------+--------+----------------+------------+           106             Multiphasic, [id number]          +----------+--------+--------+----------------+------------+  +---------+--------+--+--------+--+---------+ VertebralPSV cm/s27EDV cm/s11Antegrade +---------+--------+--+--------+--+---------+  ABI Findings: +---------+------------------+-----+---------+-----------------------------+ Right    Rt Pressure (mmHg)IndexWaveform Comment                       +---------+------------------+-----+---------+-----------------------------+ Brachial                        triphasicLine in right upper extremity +---------+------------------+-----+---------+-----------------------------+ PTA      151               1.11 triphasic                              +---------+------------------+-----+---------+-----------------------------+ DP       159               1.17 triphasic                              +---------+------------------+-----+---------+-----------------------------+ Great Toe                       Normal                                 +---------+------------------+-----+---------+-----------------------------+ +---------+------------------+-----+---------+-------+ Left     Lt Pressure (mmHg)IndexWaveform Comment +---------+------------------+-----+---------+-------+ Brachial 136                    triphasic        +---------+------------------+-----+---------+-------+ PTA  142               1.04 triphasic        +---------+------------------+-----+---------+-------+ DP       140               1.03 triphasic        +---------+------------------+-----+---------+-------+ [place] Shown                    Normal           +---------+------------------+-----+---------+-------+  Summary: Right Carotid: The extracranial vessels were near-normal with only minimal wall                thickening or plaque. Left Carotid: The extracranial vessels were near-normal with only minimal wall               thickening or plaque. Vertebrals:  Bilateral vertebral arteries demonstrate antegrade flow. Subclavians: Normal flow  hemodynamics were seen in bilateral subclavian              arteries.  *See table(s) above for measurements and observations. Right ABI: Resting right ankle-brachial index is within normal range. The right toe-brachial index is normal. Left ABI: Resting left ankle-brachial index is within normal range. The left toe-brachial index is normal.  Electronically signed by Sherald Hess MD on 11/03/2022 at 4:30:11 PM.    Final    VAS [place] LOWER EXTREMITY VENOUS (DVT)  Result Date: 11/03/2022  Lower Venous DVT Study Patient Name:  Douglas [name]  Date of Exam:   11/03/2022 Medical Rec #: [id number]          Accession #:    [id number] Date of Birth: [birth year]         Patient Gender: M Patient Age:   57 years Exam Location:  [facility] Procedure:      VAS [place] LOWER EXTREMITY VENOUS (DVT) Referring Phys: Reuel Boom BENSIMHON --------------------------------------------------------------------------------  Indications: Pre-VAD workup.  Comparison Study: No prior studies. Performing Technologist: Jean Rosenthal RDMS, RVT  Examination Guidelines: A complete evaluation includes B-mode imaging, spectral Doppler, color Doppler, and power Doppler as needed of all accessible portions of each vessel. Bilateral testing is considered an integral part of a complete examination. Limited examinations for reoccurring indications may be performed as noted. The reflux portion of the exam is performed with the patient in reverse Trendelenburg.  +---------+---------------+---------+-----------+----------+--------------+ RIGHT    CompressibilityPhasicitySpontaneityPropertiesThrombus Aging +---------+---------------+---------+-----------+----------+--------------+ CFV      Full           Yes      Yes                                 +---------+---------------+---------+-----------+----------+--------------+ SFJ      Full                                                         +---------+---------------+---------+-----------+----------+--------------+ FV Prox  Full                                                        +---------+---------------+---------+-----------+----------+--------------+ FV Mid  Full                                                        +---------+---------------+---------+-----------+----------+--------------+ FV DistalFull                                                        +---------+---------------+---------+-----------+----------+--------------+ PFV      Full                                                        +---------+---------------+---------+-----------+----------+--------------+ POP      Full           Yes      Yes                                 +---------+---------------+---------+-----------+----------+--------------+ PTV      Full                                                        +---------+---------------+---------+-----------+----------+--------------+ PERO     Full                                                        +---------+---------------+---------+-----------+----------+--------------+   +---------+---------------+---------+-----------+----------+--------------+ LEFT     CompressibilityPhasicitySpontaneityPropertiesThrombus Aging +---------+---------------+---------+-----------+----------+--------------+ CFV      Full           Yes      Yes                                 +---------+---------------+---------+-----------+----------+--------------+ SFJ      Full                                                        +---------+---------------+---------+-----------+----------+--------------+ FV Prox  Full                                                        +---------+---------------+---------+-----------+----------+--------------+ FV Mid   Full                                                         +---------+---------------+---------+-----------+----------+--------------+  FV DistalFull                                                        +---------+---------------+---------+-----------+----------+--------------+ PFV      Full                                                        +---------+---------------+---------+-----------+----------+--------------+ POP      Full           Yes      Yes                                 +---------+---------------+---------+-----------+----------+--------------+ PTV      Full                                                        +---------+---------------+---------+-----------+----------+--------------+ PERO     Full                                                        +---------+---------------+---------+-----------+----------+--------------+     Summary: RIGHT: - There is no evidence of deep vein thrombosis in the lower extremity.  - No cystic structure found in the popliteal fossa.  LEFT: - There is no evidence of deep vein thrombosis in the lower extremity.  - No cystic structure found in the popliteal fossa.  *See table(s) above for measurements and observations. Electronically signed by Sherald Hess MD on 11/03/2022 at 4:28:34 PM.    Final    CT CHEST ABDOMEN PELVIS WO CONTRAST  Result Date: 11/02/2022 CLINICAL DATA:  LVAD evaluation; preop r/u for surgical contraindication EXAM: CT CHEST, ABDOMEN AND PELVIS WITHOUT CONTRAST TECHNIQUE: Multidetector CT imaging of the chest, abdomen and pelvis was performed following the standard protocol without IV contrast. RADIATION DOSE REDUCTION: This exam was performed according to the departmental dose-optimization program which includes automated exposure control, adjustment of the mA and/or kV according to patient size and/or use of iterative reconstruction technique. COMPARISON:  None Available. FINDINGS: CT CHEST FINDINGS Cardiovascular: Left chest wall cardiac defibrillator.  Right PICC with tip terminating in the distal the superior vena cava. No significant pericardial effusion. The thoracic aorta is normal in caliber. No atherosclerotic plaque of the thoracic aorta. No coronary artery calcifications. The main pulmonary artery is enlarged in caliber measuring up to 3.9 cm. Mediastinum/Nodes: Multiple prominent mediastinal lymph nodes as well as enlarged precarinal lymph node measuring 1.3 cm (3:25). Question right hilar lymphadenopathy (3:28), noting limited sensitivity for the detection of hilar adenopathy on this noncontrast study. No axillary lymph nodes. Thyroid gland, trachea, and esophagus demonstrate no significant findings. Lungs/Pleura: Patchy bilateral ground-glass airspace opacities most prominent within the right upper lobe measuring up to 1.2 x 1.2 cm (4:30). No pulmonary mass. No pleural  effusion. No pneumothorax. Musculoskeletal: No chest wall abnormality. No suspicious lytic or blastic osseous lesions. No acute displaced fracture. Multilevel degenerative changes of the spine. CT ABDOMEN PELVIS FINDINGS Hepatobiliary: No focal liver abnormality. No gallstones, gallbladder wall thickening, or pericholecystic fluid. No biliary dilatation. Pancreas: No focal lesion. Normal pancreatic contour. No surrounding inflammatory changes. No main pancreatic ductal dilatation. Spleen: Normal in size without focal abnormality. Adrenals/Urinary Tract: No adrenal nodule bilaterally. No nephrolithiasis and no hydronephrosis. No definite contour-deforming renal mass. No ureterolithiasis or hydroureter. The urinary bladder is unremarkable. Stomach/Bowel: Stomach is within normal limits. No evidence of bowel wall thickening or dilatation. Appendix appears normal. Vascular/Lymphatic: No abdominal aorta or iliac aneurysm. No abdominal, pelvic, or inguinal lymphadenopathy. Reproductive: Prostate is unremarkable. Other: No intraperitoneal free fluid. No intraperitoneal free gas. No organized  fluid collection. Musculoskeletal: Small fat containing umbilical hernia. Bilateral small inguinal hernias. Subcutaneus soft tissue edema along the left anterior abdomen likely due to medication injection. No suspicious lytic or blastic osseous lesions. No acute displaced fracture. Multilevel degenerative changes of the spine. IMPRESSION: 1. Patchy bilateral ground-glass airspace opacities most prominent within the right upper lobe measuring up to 1.2 x 1.2 cm Findings suggestive of infection/inflammation. Recommend follow-up CT in 3 months to evaluate for resolution. 2. Multiple prominent mediastinal lymph nodes as well as enlarged precarinal lymph node measuring 1.3 cm. Question right hilar lymphadenopathy, noting limited sensitivity for the detection of hilar adenopathy on this noncontrast study. Findings may be reactive in etiology. 3. Cardiomegaly. 4. Enlarged main pulmonary artery suggestive of pulmonary hypertension. 5. Small fat containing umbilical hernia. Bilateral small inguinal hernias. 6. No acute intra-abdominal or intrapelvic abnormality with limited evaluation on this noncontrast study. Electronically Signed   By: Tish Frederickson M.D.   On: 11/02/2022 21:35   DG Orthopantogram  Result Date: 11/02/2022 CLINICAL DATA:  Preop. EXAMTresa Endo COMPARISON:  January 24, 2013. FINDINGS: No fracture or lytic area is seen involving the mandible. Several missing teeth are noted. IMPRESSION: No significant abnormality seen involving the mandible. Electronically Signed   By: Lupita Raider M.D.   On: 11/02/2022 16:45     STS Risk Calculator:  MVRepair/replacement.   Procedure Type: Isolated MVR Perioperative Outcome Estimate % Operative Mortality 4.53% Morbidity & Mortality 28.8% Stroke 0.979% Renal Failure 3.74% Reoperation 8.88% Prolonged Ventilation 21.8% Deep Sternal Wound Infection 0.222% Long Hospital Stay (>14 days) 20.4% Short Hospital Stay (<6  days)* 10.5%  Procedure Type: Isolated MVr Perioperative Outcome Estimate % Operative Mortality 3.91% Morbidity & Mortality 21.5% Stroke 0.007% Renal Failure 3.1% Reoperation 6.48% Prolonged Ventilation 19.2% Deep Sternal Wound Infection 0.1% Long Hospital Stay (>14 days) 11.2% Short Hospital Stay (<6 days)* 24.8%  [facility] Cardiomyopathy Questionnaire     11/05/2022   12:40 PM  KCCQ-12  1 a. Ability to shower/bathe Quite a bit limited  1 b. Ability to walk 1 block Extremely limited  1 c. Ability to hurry/jog Extremely limited  2. Edema feet/ankles/legs 1-2 times a week  3. Limited by fatigue Several times a day  4. Limited by dyspnea Several times a day  5. Sitting up / on 3+ pillows Every night  6. Limited enjoyment of life Limited quite a bit  7. Rest of life w/ symptoms Mostly dissatisfied  8 a. Participation in hobbies Severely limited  8 b. Participation in chores Severely limited  8 c. Visiting family/friends Limited quite a bit     Assessment and Plan:   Douglas [name] is a 46 y.o.  male with symptoms of severe, stage D mitral regurgitation with NYHA Class III symptoms. I have reviewed the patient's recent echocardiogram which is notable for severe LV systolic function with an LVEF at 20-25% with severely dilated LV, RV severely enlarged with low normal function, PASP 70 mmHg, severe BASE, moderate MR, and moderate TR. Subsequent TEE showed severe central functional MR with mild restriction of posterior leaflet and no significant flow reversal in pulmonary veins. TEE sent for industry review which shows LA dimensions  large enough for device steering and straddle. MR felt to be  secondary to restricted posterior leaflet and lack of central coaptation. Posterior leaflet measures between 1.3-1.6cm in the LVOT views with an MVA at 9.36cm2, mean gradient at at a HR of 102bpm.   I have reviewed the natural history of mitral regurgitation with the patient. We have  discussed the limitations of medical therapy and the poor prognosis associated with symptomatic mitral regurgitation. We have also reviewed potential treatment options, including palliative medical therapy, conventional surgical mitral valve repair or replacement, and percutaneous mitral valve repair with MitraClip. We discussed treatment options in the context of this patient's specific comorbid medical conditions.    The patient's predicted risk of mortality with conventional mitral valve replacement/repair is 4.53 % / 3.91 % respectively, primarily based on hx of NICM, CKD, DM2 with insulin dependence, and recurrent VT. Other significant comorbid conditions obesity, medical non-compliance, illicit drug abuse, and poor social situation.   Given poor candidacy for LVAD/ home advanced therapies, with AHF discussion, plan will be for inpatient MitraClip 11/09/22 with Dr. Excell Seltzer and Dr. Lynnette Caffey.   MD to follow with final recommendations.   Signed, Georgie Chard, NP  11/05/2022 10:31 AM

## 2022-11-05 NOTE — Progress Notes (Signed)
PROGRESS NOTE    Douglas [name]  VHQ:[id number] DOB: 12-Dec-1975 DOA: 10/25/2022 PCP: Claiborne Rigg, NP  Douglas [name] is a 46 y.o. M with sCHF EF 20% s/p ICD, polysubstance abuse, HTN, obesity, and CAD who presented with progressive shortness of breath.    12/11: Admitted on diuretics 12/12: Advanced HF team consulted 12/13: Milrinone started 12/20: CT surgery and LVAD SW consulted    Subjective: Feels fair, no events overnight  Assessment and Plan:  Acute on chronic systolic CHF  Mitral regurgitation, severe -ECHO 12/23 w/ EF of 20%, severe MR -HF team following, now Inotrope dependent - Milrinone and diuretics per cardiology, 12.9L negative -- Not felt to be suitable for LVAD due to lack of social support, now being evaluated for mitral clip - Beta-blocker held due to low output - GDMT limited by AKI/CKD - Continue Farxiga, Aldactone, Imdur, hydralazine -Going down for right heart cath today, followed by eval for mTEER this admission  Acute renal failure superimposed on stage IIIb chronic kidney disease (HCC) Baseline creatinine 1.6, creatinine on admission 2, now 1.8  NSVT (nonsustained ventricular tachycardia) (HCC) - Continue amiodarone infusion - keep Mag> 2 and K>  Depression, major, single episode - Continue Xanax  Obesity BMI 32.5  Cocaine use Reports cessation  Essential hypertension Blood pressure controlled - Continue imdur, hydralazine  Hyperlipidemia - Continue atorvastatin  Diabetes mellitus with stage 2 chronic kidney disease (HCC) Glucose controlled - Continue glargine, SSI, Farxiga   DVT prophylaxis:lovenox Code Status: Full Code Family Communication: none present Disposition Plan: TBD  Consultants: CHF team   Procedures:   Antimicrobials:    Objective: Vitals:   11/05/22 0814 11/05/22 0819 11/05/22 0824 11/05/22 0912  BP: (!) 167/146 (!) 134/117 (!) 158/137 127/84  Pulse: 85 81 82 86  Resp: (!) Temp:     97.6 F (36.4 C)  TempSrc:    Oral  SpO2: 91% 91% 93% 98%  Weight:      Height:        Intake/Output Summary (Last 24 hours) at 11/05/2022 1153 Last data filed at 11/05/2022 4132 Gross per 24 hour  Intake 1574.24 ml  Output 2900 ml  Net -1325.76 ml   Filed Weights   11/03/22 0553 11/04/22 0603 11/05/22 0500  Weight: 120.5 kg 121.1 kg 121.7 kg    Examination:  Gen: Pleasant male sitting up in bed, AAOx3, no distress HEENT: No JVD CVS: S1-S2, regular rhythm Lungs: Clear bilaterally Abdomen: Soft, nontender, bowel sounds present Extremities: No edema  Skin: no new rashes on exposed skin    Data Reviewed:   CBC: Recent Labs  Lab 11/02/22 1206 11/04/22 0330  WBC 9.7 7.6  NEUTROABS 5.6  --   HGB 14.6 14.4  HCT 43.6 42.2  MCV 85.0 84.6  PLT 267 249   Basic Metabolic Panel: Recent Labs  Lab 10/30/22 0428 10/31/22 0655 11/01/22 0520 11/02/22 0420 11/02/22 1206 11/03/22 0500 11/04/22 0330 11/04/22 1407 11/05/22 0512  NA 137   < > 138 137 134* 136 137 133* 136  K 3.4*   < > 3.3* 3.9 4.0 3.8 4.0 3.6 4.4  CL 99   < > 100 100 98 99 101 99 102  CO2 29   < > GLUCOSE 114*   < > 105* 123* 131* 252* 104* 162* 116*  BUN 37*   < > 34* 32* 33* 29* 30* 34* 33*  CREATININE 1.68*   < >  1.86* 1.89* 1.98* 1.77* 1.99* 2.10* 1.86*  CALCIUM 8.4*   < > 8.6* 8.9 8.6* 8.4* 8.7* 8.7* 9.0  MG 2.0  --  2.1 2.1  --   --   --   --  2.2   < > = values in this interval not displayed.   GFR: Estimated Creatinine Clearance: 69.2 mL/min (A) (by C-G formula based on SCr of 1.86 mg/dL (H)). Liver Function Tests: Recent Labs  Lab 11/02/22 1206  AST 19  ALT 22  ALKPHOS 63  BILITOT 1.4*  PROT 6.8  ALBUMIN 2.7*   No results for input(s): "LIPASE", "AMYLASE" in the last 168 hours. No results for input(s): "AMMONIA" in the last 168 hours. Coagulation Profile: Recent Labs  Lab 11/02/22 1206  INR 1.2   Cardiac Enzymes: No results for input(s): "CKTOTAL",  "CKMB", "CKMBINDEX", "TROPONINI" in the last 168 hours. BNP (last 3 results) No results for input(s): "PROBNP" in the last 8760 hours. HbA1C: Recent Labs    11/02/22 1206  HGBA1C 6.4*   CBG: Recent Labs  Lab 11/04/22 1146 11/04/22 1622 11/04/22 2116 11/05/22 0604 11/05/22 0922  GLUCAP 118* 136* 158* 114* 130*   Lipid Profile: No results for input(s): "CHOL", "HDL", "LDLCALC", "TRIG", "CHOLHDL", "LDLDIRECT" in the last 72 hours. Thyroid Function Tests: Recent Labs    11/02/22 1206  TSH 2.959  FREET4 1.16*   Anemia Panel: No results for input(s): "VITAMINB12", "FOLATE", "FERRITIN", "TIBC", "IRON", "RETICCTPCT" in the last 72 hours. Urine analysis:    Component Value Date/Time   COLORURINE YELLOW 11/02/2022 1450   APPEARANCEUR CLEAR 11/02/2022 1450   LABSPEC 1.009 11/02/2022 1450   PHURINE 6.0 11/02/2022 1450   GLUCOSEU >=500 (A) 11/02/2022 1450   HGBUR NEGATIVE 11/02/2022 1450   BILIRUBINUR NEGATIVE 11/02/2022 1450   KETONESUR NEGATIVE 11/02/2022 1450   PROTEINUR 100 (A) 11/02/2022 1450   UROBILINOGEN 0.2 06/26/2021 1229   NITRITE NEGATIVE 11/02/2022 1450   LEUKOCYTESUR NEGATIVE 11/02/2022 1450   Sepsis Labs: @LABRCNTIP (procalcitonin:4,lacticidven:4)  ) Recent Results (from the past 240 hour(s))  Respiratory (~20 pathogens) panel by PCR     Status: None   Collection Time: 10/26/22  5:43 PM   Specimen: Nasopharyngeal Swab; Respiratory  Result Value Ref Range Status   Adenovirus NOT DETECTED NOT DETECTED Final   Coronavirus 229E NOT DETECTED NOT DETECTED Final    Comment: (NOTE) The Coronavirus on the Respiratory Panel, DOES NOT test for the novel  Coronavirus (2019 nCoV)    Coronavirus HKU1 NOT DETECTED NOT DETECTED Final   Coronavirus NL63 NOT DETECTED NOT DETECTED Final   Coronavirus OC43 NOT DETECTED NOT DETECTED Final   Metapneumovirus NOT DETECTED NOT DETECTED Final   Rhinovirus / Enterovirus NOT DETECTED NOT DETECTED Final   Influenza A NOT  DETECTED NOT DETECTED Final   Influenza B NOT DETECTED NOT DETECTED Final   Parainfluenza Virus 1 NOT DETECTED NOT DETECTED Final   Parainfluenza Virus 2 NOT DETECTED NOT DETECTED Final   Parainfluenza Virus 3 NOT DETECTED NOT DETECTED Final   Parainfluenza Virus 4 NOT DETECTED NOT DETECTED Final   Respiratory Syncytial Virus NOT DETECTED NOT DETECTED Final   Bordetella pertussis NOT DETECTED NOT DETECTED Final   Bordetella Parapertussis NOT DETECTED NOT DETECTED Final   Chlamydophila pneumoniae NOT DETECTED NOT DETECTED Final   Mycoplasma pneumoniae NOT DETECTED NOT DETECTED Final    Comment: Performed at [facility], [address] [place], [place]     Radiology Studies: CARDIAC CATHETERIZATION  Result Date:  11/05/2022 Findings: On milrinone 0.25 mcg/kg/min RA = 2 RV = 59/9 PA = 58/30 (44) PCW = 33 (v = 48) Fick cardiac output/index = 5.3/2.1 Thermo CO/CI = 4.4 1.8 PVR = 2.1 (Fick) 2.5 (TD) Ao sat =  93% PA sat = 59%, 63% PAPi = 14 Assessment: 1, Volume overload with low output despite milrinone support 2. Prominent v waves in PCWP tracing c/w known severe MR 3. Normal PAPi suggestive of preserved RV function Plan/Discussion: Continue mTEER eval [name], MD 8:32 AM  VAS US DOPPLER PRE VAD  Result Date: 11/03/2022 PERIOPERATIVE VASCULAR EVALUATION Patient Name:  Douglas [name]  Date of Exam:   11/03/2022 Medical Rec #: [id number]          Accession #:    [id number] Date of Birth: 1976/02/08         Patient Gender: M Patient Age:   82 years Exam Location:  [facility] Procedure:      VAS US DOPPLER PRE VAD Referring Phys: Reuel Boom BENSIMHON --------------------------------------------------------------------------------  Indications:  Pre-VAD workup. Risk Factors: Hypertension, hyperlipidemia, Diabetes, current smoker, coronary               artery disease. Performing Technologist: Ernestene Mention RVT, RDMS Supporting Technologist: Jean Rosenthal RDMS, RVT   Examination Guidelines: A complete evaluation includes B-mode imaging, spectral Doppler, color Doppler, and power Doppler as needed of all accessible portions of each vessel. Bilateral testing is considered an integral part of a complete examination. Limited examinations for reoccurring indications may be performed as noted.  Right Carotid Findings: +----------+--------+--------+--------+--------+------------------+           PSV cm/sEDV cm/sStenosisDescribeComments           +----------+--------+--------+--------+--------+------------------+ CCA Prox  85      15                                         +----------+--------+--------+--------+--------+------------------+ CCA Distal70      27                      intimal thickening +----------+--------+--------+--------+--------+------------------+ ICA Prox  33      14                                         +----------+--------+--------+--------+--------+------------------+ ICA Distal56      18                                         +----------+--------+--------+--------+--------+------------------+ ECA       109     19                                         +----------+--------+--------+--------+--------+------------------+ +----------+--------+-------+----------------+------------+           PSV cm/sEDV cmsDescribe        Arm Pressure +----------+--------+-------+----------------+------------+ Subclavian102            Multiphasic, WNL             +----------+--------+-------+----------------+------------+ +---------+--------+--+--------+-+---------+ VertebralPSV cm/s25EDV cm/s8Antegrade +---------+--------+--+--------+-+---------+ Left Carotid Findings: +----------+--------+--------+--------+--------+------------------+  PSV cm/sEDV cm/sStenosisDescribeComments           +----------+--------+--------+--------+--------+------------------+ CCA Prox  103     17                                          +----------+--------+--------+--------+--------+------------------+ CCA Distal71      19                      intimal thickening +----------+--------+--------+--------+--------+------------------+ ICA Prox  55      19                                         +----------+--------+--------+--------+--------+------------------+ ICA Distal51      22                                         +----------+--------+--------+--------+--------+------------------+ ECA       84      11                                         +----------+--------+--------+--------+--------+------------------+ +----------+--------+--------+----------------+------------+ SubclavianPSV cm/sEDV cm/sDescribe        Arm Pressure +----------+--------+--------+----------------+------------+           106             Multiphasic, [id number]          +----------+--------+--------+----------------+------------+ +---------+--------+--+--------+--+---------+ VertebralPSV cm/s27EDV cm/s11Antegrade +---------+--------+--+--------+--+---------+  ABI Findings: +---------+------------------+-----+---------+-----------------------------+ Right    Rt Pressure (mmHg)IndexWaveform Comment                       +---------+------------------+-----+---------+-----------------------------+ Brachial                        triphasicLine in right upper extremity +---------+------------------+-----+---------+-----------------------------+ PTA      151               1.11 triphasic                              +---------+------------------+-----+---------+-----------------------------+ DP       159               1.17 triphasic                              +---------+------------------+-----+---------+-----------------------------+ Great Toe                       Normal                                 +---------+------------------+-----+---------+-----------------------------+  +---------+------------------+-----+---------+-------+ Left     Lt Pressure (mmHg)IndexWaveform Comment +---------+------------------+-----+---------+-------+ Brachial 136                    triphasic        +---------+------------------+-----+---------+-------+ PTA      142               1.04  triphasic        +---------+------------------+-----+---------+-------+ DP       140               1.03 triphasic        +---------+------------------+-----+---------+-------+ [place] Shown                    Normal           +---------+------------------+-----+---------+-------+  Summary: Right Carotid: The extracranial vessels were near-normal with only minimal wall                thickening or plaque. Left Carotid: The extracranial vessels were near-normal with only minimal wall               thickening or plaque. Vertebrals:  Bilateral vertebral arteries demonstrate antegrade flow. Subclavians: Normal flow hemodynamics were seen in bilateral subclavian              arteries.  *See table(s) above for measurements and observations. Right ABI: Resting right ankle-brachial index is within normal range. The right toe-brachial index is normal. Left ABI: Resting left ankle-brachial index is within normal range. The left toe-brachial index is normal.  Electronically signed by Sherald Hess MD on 11/03/2022 at 4:30:11 PM.    Final    VAS [place] LOWER EXTREMITY VENOUS (DVT)  Result Date: 11/03/2022  Lower Venous DVT Study Patient Name:  Douglas [name]  Date of Exam:   11/03/2022 Medical Rec #: [id number]          Accession #:    [id number] Date of Birth: [birth year]         Patient Gender: M Patient Age:   45 years Exam Location:  [facility] Procedure:      VAS [place] LOWER EXTREMITY VENOUS (DVT) Referring Phys: Reuel Boom BENSIMHON --------------------------------------------------------------------------------  Indications: Pre-VAD workup.  Comparison Study: No prior studies. Performing  Technologist: Jean Rosenthal RDMS, RVT  Examination Guidelines: A complete evaluation includes B-mode imaging, spectral Doppler, color Doppler, and power Doppler as needed of all accessible portions of each vessel. Bilateral testing is considered an integral part of a complete examination. Limited examinations for reoccurring indications may be performed as noted. The reflux portion of the exam is performed with the patient in reverse Trendelenburg.  +---------+---------------+---------+-----------+----------+--------------+ RIGHT    CompressibilityPhasicitySpontaneityPropertiesThrombus Aging +---------+---------------+---------+-----------+----------+--------------+ CFV      Full           Yes      Yes                                 +---------+---------------+---------+-----------+----------+--------------+ SFJ      Full                                                        +---------+---------------+---------+-----------+----------+--------------+ FV Prox  Full                                                        +---------+---------------+---------+-----------+----------+--------------+ FV Mid   Full                                                        +---------+---------------+---------+-----------+----------+--------------+  FV DistalFull                                                        +---------+---------------+---------+-----------+----------+--------------+ PFV      Full                                                        +---------+---------------+---------+-----------+----------+--------------+ POP      Full           Yes      Yes                                 +---------+---------------+---------+-----------+----------+--------------+ PTV      Full                                                        +---------+---------------+---------+-----------+----------+--------------+ PERO     Full                                                         +---------+---------------+---------+-----------+----------+--------------+   +---------+---------------+---------+-----------+----------+--------------+ LEFT     CompressibilityPhasicitySpontaneityPropertiesThrombus Aging +---------+---------------+---------+-----------+----------+--------------+ CFV      Full           Yes      Yes                                 +---------+---------------+---------+-----------+----------+--------------+ SFJ      Full                                                        +---------+---------------+---------+-----------+----------+--------------+ FV Prox  Full                                                        +---------+---------------+---------+-----------+----------+--------------+ FV Mid   Full                                                        +---------+---------------+---------+-----------+----------+--------------+ FV DistalFull                                                        +---------+---------------+---------+-----------+----------+--------------+  PFV      Full                                                        +---------+---------------+---------+-----------+----------+--------------+ POP      Full           Yes      Yes                                 +---------+---------------+---------+-----------+----------+--------------+ PTV      Full                                                        +---------+---------------+---------+-----------+----------+--------------+ PERO     Full                                                        +---------+---------------+---------+-----------+----------+--------------+     Summary: RIGHT: - There is no evidence of deep vein thrombosis in the lower extremity.  - No cystic structure found in the popliteal fossa.  LEFT: - There is no evidence of deep vein thrombosis in the lower extremity.  - No cystic structure found  in the popliteal fossa.  *See table(s) above for measurements and observations. Electronically signed by Sherald Hess MD on 11/03/2022 at 4:28:34 PM.    Final      Scheduled Meds:  albuterol  2.5 mg Nebulization Once   aspirin  81 mg Oral Daily   atorvastatin  80 mg Oral Daily   Chlorhexidine Gluconate Cloth  6 each Topical Daily   dapagliflozin propanediol  10 mg Oral QAC breakfast   [START ON 11/06/2022] enoxaparin (LOVENOX) injection  60 mg Subcutaneous Q24H   hydrALAZINE  25 mg Oral Q8H   insulin aspart  0-15 Units Subcutaneous TID WC   insulin glargine-yfgn  15 Units Subcutaneous Daily   isosorbide mononitrate  30 mg Oral Daily   sodium chloride flush  10-40 mL Intracatheter Q12H   sodium chloride flush  3 mL Intravenous Q12H   sodium chloride flush  3 mL Intravenous Q12H   spironolactone  12.5 mg Oral Daily   torsemide  40 mg Oral Daily   Continuous Infusions:  sodium chloride     amiodarone 30 mg/hr (11/05/22 0652)   milrinone 0.25 mcg/kg/min (11/05/22 0628)     LOS: 11 days    Time spent:    Zannie Cove, MD [facility]   11/05/2022, 11:53 AM

## 2022-11-05 NOTE — Progress Notes (Signed)
Labs attempted through PICC line.  Line is very sluggish and will not draw blood.  Caps changed and lines flushed with pulsing technique.  No blood return. RN requested Cathflo from night shift floor coverage Physician.  PRN Cathflow ordered.  RN requested medication from Pharmacy.  Medication sent. Charge RN notified RN Felecia Shelling that floor protocols do not allow administration of Cathflow by bedside RN. IV Team consult was placed.  IV nurse called Felecia Shelling, RN to state there were not enough IV team nurses to perform the request and to call Phlebotomy to come stick the patient for labs.  Phone called placed to Phlebotomy.  No answer.

## 2022-11-06 DIAGNOSIS — I5023 Acute on chronic systolic (congestive) heart failure: Secondary | ICD-10-CM | POA: Diagnosis not present

## 2022-11-06 LAB — BASIC METABOLIC PANEL
Anion gap: 9 (ref 5–15)
BUN: 38 mg/dL — ABNORMAL HIGH (ref 6–20)
CO2: 24 mmol/L (ref 22–32)
Calcium: 8.5 mg/dL — ABNORMAL LOW (ref 8.9–10.3)
Chloride: 102 mmol/L (ref 98–111)
Creatinine, Ser: 1.98 mg/dL — ABNORMAL HIGH (ref 0.61–1.24)
GFR, Estimated: 41 mL/min — ABNORMAL LOW (ref >60)
Glucose, Bld: 148 mg/dL — ABNORMAL HIGH (ref 70–99)
Potassium: 3.7 mmol/L (ref 3.5–5.1)
Sodium: 135 mmol/L (ref 135–145)

## 2022-11-06 LAB — GLUCOSE, CAPILLARY
Glucose-Capillary: 157 mg/dL — ABNORMAL HIGH (ref 70–99)
Glucose-Capillary: 134 mg/dL — ABNORMAL HIGH (ref 70–99)
Glucose-Capillary: 183 mg/dL — ABNORMAL HIGH (ref 70–99)
Glucose-Capillary: 130 mg/dL — ABNORMAL HIGH (ref 70–99)

## 2022-11-06 LAB — COOXEMETRY PANEL
Carboxyhemoglobin: 2.4 % — ABNORMAL HIGH (ref 0.5–1.5)
Methemoglobin: 0.9 % (ref 0.0–1.5)
O2 Saturation: 60.4 %
Total hemoglobin: 13.9 g/dL (ref 12.0–16.0)

## 2022-11-06 MED ORDER — PROCHLORPERAZINE EDISYLATE 10 MG/2ML IJ SOLN
10.0000 mg | Freq: Four times a day (QID) | INTRAMUSCULAR | Status: DC | PRN
  Administered 2022-11-09 – 2022-11-17 (×8): 10 mg via INTRAVENOUS
  Filled 2022-11-06 (×10): qty 2

## 2022-11-06 MED ORDER — POTASSIUM CHLORIDE CRYS ER 20 MEQ PO TBCR
20.0000 meq | EXTENDED_RELEASE_TABLET | Freq: Once | ORAL | Status: AC
  Administered 2022-11-06: 20 meq via ORAL
  Filled 2022-11-06: qty 1

## 2022-11-06 NOTE — Progress Notes (Signed)
   11/06/22 0726  Vitals  Temp 97.9 F (36.6 C)  Temp Source Oral  BP (!) 135/109  MAP (mmHg) 118  Patient Position (if appropriate) Sitting (EOB)  Resp 16  MEWS COLOR  MEWS Score Color Green  Oxygen Therapy  SpO2 99 %  O2 Device Room Air  MEWS Score  MEWS Temp 0  MEWS Systolic 0  MEWS Pulse 0  MEWS RR 0  MEWS LOC 0  MEWS Score 0

## 2022-11-06 NOTE — Plan of Care (Signed)
  Problem: Education: Goal: Ability to demonstrate management of disease process will improve Outcome: Progressing Goal: Ability to verbalize understanding of medication therapies will improve Outcome: Progressing Goal: Individualized Educational Video(s) Outcome: Progressing   Problem: Activity: Goal: Capacity to carry out activities will improve Outcome: Progressing   Problem: Cardiac: Goal: Ability to achieve and maintain adequate cardiopulmonary perfusion will improve Outcome: Progressing   Problem: Education: Goal: Ability to describe self-care measures that may prevent or decrease complications (Diabetes Survival Skills Education) will improve Outcome: Progressing Goal: Individualized Educational Video(s) Outcome: Progressing   Problem: Coping: Goal: Ability to adjust to condition or change in health will improve Outcome: Progressing   Problem: Fluid Volume: Goal: Ability to maintain a balanced intake and output will improve Outcome: Progressing   Problem: Health Behavior/Discharge Planning: Goal: Ability to identify and utilize available resources and services will improve Outcome: Progressing Goal: Ability to manage health-related needs will improve Outcome: Progressing   Problem: Metabolic: Goal: Ability to maintain appropriate glucose levels will improve Outcome: Progressing   Problem: Nutritional: Goal: Maintenance of adequate nutrition will improve Outcome: Progressing Goal: Progress toward achieving an optimal weight will improve Outcome: Progressing   Problem: Skin Integrity: Goal: Risk for impaired skin integrity will decrease Outcome: Progressing   Problem: Tissue Perfusion: Goal: Adequacy of tissue perfusion will improve Outcome: Progressing   Problem: Education: Goal: Knowledge of General Education information will improve Description: Including pain rating scale, medication(s)/side effects and non-pharmacologic comfort measures Outcome:  Progressing   Problem: Health Behavior/Discharge Planning: Goal: Ability to manage health-related needs will improve Outcome: Progressing   Problem: Clinical Measurements: Goal: Ability to maintain clinical measurements within normal limits will improve Outcome: Progressing Goal: Will remain free from infection Outcome: Progressing Goal: Diagnostic test results will improve Outcome: Progressing Goal: Respiratory complications will improve Outcome: Progressing Goal: Cardiovascular complication will be avoided Outcome: Progressing   Problem: Activity: Goal: Risk for activity intolerance will decrease Outcome: Progressing   Problem: Nutrition: Goal: Adequate nutrition will be maintained Outcome: Progressing   Problem: Coping: Goal: Level of anxiety will decrease Outcome: Progressing   Problem: Elimination: Goal: Will not experience complications related to bowel motility Outcome: Progressing Goal: Will not experience complications related to urinary retention Outcome: Progressing   Problem: Pain Managment: Goal: General experience of comfort will improve Outcome: Progressing   Problem: Safety: Goal: Ability to remain free from injury will improve Outcome: Progressing   Problem: Skin Integrity: Goal: Risk for impaired skin integrity will decrease Outcome: Progressing   Problem: Education: Goal: Understanding of CV disease, CV risk reduction, and recovery process will improve Outcome: Progressing Goal: Individualized Educational Video(s) Outcome: Progressing   Problem: Activity: Goal: Ability to return to baseline activity level will improve Outcome: Progressing   Problem: Cardiovascular: Goal: Ability to achieve and maintain adequate cardiovascular perfusion will improve Outcome: Progressing Goal: Vascular access site(s) Level 0-1 will be maintained Outcome: Progressing   Problem: Health Behavior/Discharge Planning: Goal: Ability to safely manage  health-related needs after discharge will improve Outcome: Progressing   

## 2022-11-06 NOTE — Progress Notes (Addendum)
Received call from telemetry that patient had 21 beats of VTACH this morning. Strip to be sent. Heart failure provider made aware

## 2022-11-06 NOTE — Progress Notes (Signed)
[facility] Progress Note Patient: FRIEND DORFMAN NUU:[id number] DOB: Mar 23, 1976 DOA: 10/25/2022  DOS: the patient was seen and examined on 11/06/2022  Brief hospital course: Mr. Reno is a 46 y.o. M with sCHF EF 20% s/p ICD, polysubstance abuse, HTN, obesity, and CAD who presented with progressive shortness of breath.   12/11 Admitted for acute on chronic systolic CHF on IV diuretics 12/12 Advanced HF team consulted 12/13 Milrinone started 12/20 CT surgery and LVAD SW consulted  12/23 currently scheduled for mTEER 12/26 Assessment and Plan: Acute on chronic HFrEF. Nonischemic. SP Medtronic ICD implant. Severe central MR. Advanced heart failure team currently following.  Currently on Primacor.  Per cardiology now inotrope dependent. Underwent TEE.  EF 20%.  Has severe central MR SP RHC on 12/22 Currently on Farxiga, hydralazine, Imdur started on Aldactone.  Continue torsemide. Has not been a great candidate for VAD due to his drug use (cocaine) and difficult social situation. Currently planned for mTEER on 12/26.  AKI on CKD 3B. Baseline creatinine around 1.6.  On admission creatinine 2.  Currently stable.  Monitor.  Frequent NSVT. On amiodarone infusion.  Continuing IV while on inotrope support. Maintain K more than 4.  Mag more than 2. Beta-blocker currently on hold due to low output.  CAD. Nonobstructive. Left heart cath April 2023.  HLD. Continue statin.  Substance abuse. Cocaine use 1 week prior to admission.  UDS negative in the hospital.  (Performed after 7 days of hospital stay) Monitor.  Obesity. Body mass index is 32.88 kg/m.  Placing the patient at high risk for poor outcome.  Bilateral groundglass opacities.  Mediastinal lymphadenopathy More on the right side. Concerning for infection/inflammation.  Seen on the CT scan on 12/19. Follow-up CT in 3 months recommended.  Anxiety and depression. On Xanax.  Continue.   Subjective: No nausea no  vomiting.  No fever no chills.  Breathing stable.  No chest pain.  Physical Exam: General: in mild distress distress;  Cardiovascular: S1 and S2 Present, aortic systolic  Murmur Respiratory: good respiratory effort, Bilateral Air entry present, basal crackles crackles, no wheezes Abdomen: Bowel Sound present, Non tender  Extremities: Bilateral edema Neurology: alert and oriented to time, place, and person   Data Reviewed: I have Reviewed nursing notes, Vitals, and Lab results. Since last encounter, pertinent lab results CBC and BMP   . I have ordered test including CBC and BMP  .   Disposition: Status is: Inpatient Remains inpatient appropriate because: Still needs to continue Primacor monitor for volume status and renal function  Family Communication: No one at bedside Level of care: Telemetry Cardiac Continue telemetry due to PVCs and NSVT's. Vitals:   11/06/22 0426 11/06/22 0726 11/06/22 1150 11/06/22 1539  BP: (!) 127/95 (!) 135/109 112/82 120/80  Pulse: 83  92 85  Resp: 16 16 16 16   Temp: 97.9 F (36.6 C) 97.9 F (36.6 C) 97.9 F (36.6 C) 98 F (36.7 C)  TempSrc: Oral Oral Oral Oral  SpO2: 95% 99% 96% 94%  Weight: 122.5 kg     Height:         Author: , MD 11/06/2022 5:11 PM  Please look on [email or web address] to find out who is on call.

## 2022-11-06 NOTE — Progress Notes (Signed)
Advanced Heart Failure Rounding Note  PCP-Cardiologist: None   Subjective:    Remains on milrinone 0.25. Co-ox 61%  Still with SOB with any exertion.   Scr stable at 1.98   Seen by Structural Heart Team and plan mTEER on 12/26   Objective:   Weight Range: 122.5 kg Body mass index is 32.88 kg/m.   Vital Signs:   Temp:  [97.7 F (36.5 C)-97.9 F (36.6 C)] 97.9 F (36.6 C) (12/23 1150) Pulse Rate:  [83-92] 92 (12/23 1150) Resp:  [16-18] 16 (12/23 1150) BP: (95-135)/(75-109) 112/82 (12/23 1150) SpO2:  [94 %-99 %] 96 % (12/23 1150) Weight:  [122.5 kg] 122.5 kg (12/23 0426) Last BM Date : 11/05/22  Weight change: Filed Weights   11/04/22 0603 11/05/22 0500 11/06/22 0426  Weight: 121.1 kg 121.7 kg 122.5 kg    Intake/Output:   Intake/Output Summary (Last 24 hours) at 11/06/2022 1432 Last data filed at 11/06/2022 0800 Gross per 24 hour  Intake 1102.57 ml  Output 2300 ml  Net -1197.43 ml      Physical Exam    General:  Sitting up No resp difficulty HEENT: normal Neck: supple. JVP 7. Carotids 2+ bilat; no bruits. No lymphadenopathy or thryomegaly appreciated. Cor:  Regular rate & rhythm. 2/6 MR Lungs: clear Abdomen: soft, nontender, nondistended. No hepatosplenomegaly. No bruits or masses. Good bowel sounds. Extremities: no cyanosis, clubbing, rash, edema Neuro: alert & orientedx3, cranial nerves grossly intact. moves all 4 extremities w/o difficulty. Affect pleasant   Telemetry    SR 80-90s Personally reviewed   Labs    CBC Recent Labs    11/04/22 0330 11/05/22 0814  WBC 7.6  --   HGB 14.4 14.6  15.0  HCT 42.2 43.0  44.0  MCV 84.6  --   PLT 249  --     Basic Metabolic Panel Recent Labs    38/18/29 0512 11/05/22 0814 11/06/22 0315  NA 136 138  137 135  K 4.4 3.8  3.8 3.7  CL 102  --  102  CO2 24  --  24  GLUCOSE 116*  --  148*  BUN 33*  --  38*  CREATININE 1.86*  --  1.98*  CALCIUM 9.0  --  8.5*  MG 2.2  --   --      Liver Function Tests No results for input(s): "AST", "ALT", "ALKPHOS", "BILITOT", "PROT", "ALBUMIN" in the last 72 hours.  No results for input(s): "LIPASE", "AMYLASE" in the last 72 hours. Cardiac Enzymes No results for input(s): "CKTOTAL", "CKMB", "CKMBINDEX", "TROPONINI" in the last 72 hours.  BNP: BNP (last 3 results) Recent Labs    10/18/22 1821 10/25/22 1442 10/28/22 1825  BNP 1,494.7* 1,428.2* 491.6*     ProBNP (last 3 results) No results for input(s): "PROBNP" in the last 8760 hours.   D-Dimer No results for input(s): "DDIMER" in the last 72 hours. Hemoglobin A1C No results for input(s): "HGBA1C" in the last 72 hours.  Fasting Lipid Panel No results for input(s): "CHOL", "HDL", "LDLCALC", "TRIG", "CHOLHDL", "LDLDIRECT" in the last 72 hours. Thyroid Function Tests No results for input(s): "TSH", "T4TOTAL", "T3FREE", "THYROIDAB" in the last 72 hours.  Invalid input(s): "FREET3"   Other results:   Imaging    No results found.   Medications:     Scheduled Medications:  albuterol  2.5 mg Nebulization Once   aspirin  81 mg Oral Daily   atorvastatin  80 mg Oral Daily   Chlorhexidine Gluconate Cloth  6 each Topical Daily   dapagliflozin propanediol  10 mg Oral QAC breakfast   enoxaparin (LOVENOX) injection  60 mg Subcutaneous Q24H   hydrALAZINE  25 mg Oral Q8H   insulin aspart  0-15 Units Subcutaneous TID WC   insulin glargine-yfgn  15 Units Subcutaneous Daily   isosorbide mononitrate  30 mg Oral Daily   sodium chloride flush  10-40 mL Intracatheter Q12H   sodium chloride flush  3 mL Intravenous Q12H   sodium chloride flush  3 mL Intravenous Q12H   spironolactone  12.5 mg Oral Daily   torsemide  40 mg Oral Daily    Infusions:  sodium chloride     amiodarone 30 mg/hr (11/06/22 0645)   milrinone 0.25 mcg/kg/min (11/06/22 0938)    PRN Medications: sodium chloride, acetaminophen, ALPRAZolam, guaiFENesin-dextromethorphan, melatonin,  polyethylene glycol, sodium chloride flush, sodium chloride flush    Patient Profile   Douglas [name] is a 46 year old with a history of HTN, hyperlipidemia, smoker, cocaine abuse,NSVT,  NICM, Medtronic ICD, and chronic HFrEF. Last used cocaine about a week ago.  EF has been down for some time.    Admitted recently with A/C HFrEF. Suspect he had residual congestion when discharged.  Readmitted 12/11 with A/C HFrEF.  Assessment/Plan   1. A/C HFrEF  - NICM. Has Medtronic ICD. 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   - Admit early December. Suspect still had volume on board at discharge + increased fluid intake leading to readmission several days later. - NYHA IV on admit - TEE this admit EF 20%, severe central MR - CO-OX 64% on milrinone 0.25 - RHC on 11/05/22 RA 2 PCW 33 (v = 48) CI ~2.0  - Continue spiro 12.5 mg daily - Off carvedilol with low output - Continue farxiga - Continue hydral and imdur  - Continue to hold Entresto for now with renal impairment - Diurese as tolerated. Continue torsemide. Suspect may need IV diuretics tomorrow   - Has end-stage HF and appears that he is now inotrope dependent. This may be the last opportunity to consider advanced therapies including VAD. However, this decision is complicated by ongoing drug use and difficult social situation. Has been turned down for VAD due primarily due to limited social support.Plan mTEER 12.26   2. AKI on CKD Stage IIIb - Creatinine on admit 2. Baseline previously ~ 1.6.  - Cr stable  today 1.98 - Diuretics as above. RHC today - Continue inotrope support   3. Substance Abuse - Last used cocaine about a week PTA. - Discussed cessation.  - reports he is motivated to quit so he can qualify for VAD  4. CAD - LHC 02/2022 nonobstructive CAD - LDL 124  - Continue atorva   5. DMII -On SSI -Continue Farxiga   6. HTN  - BP  stable   7. NSVT - Previously on amio but he stopped in April of this year.  I am  not sure why he stopped.  - Frequent burst of NSVT noted on device since the beginning of December.  - Now on IV amio d/t frequent runs NSVT.  - Continue IV amio while on inotrope support, seems to be having less VT last couple of days - Keep K > 4.0 Mg > 2.0  - Off Coreg with low-output   8. Obesity  - Body mass index is 32.88 kg/m.   9. MR  - TEE 12/14: EF 20%, Severe central MR - As above -> mTEER 12/26  10. Mediastinal lymph nodes, GGO RUL - Noted on preVAD CT - Will need repeat imaging in 3-6 months to follow   SDOH: -Has medicaid -Works part-time at [employer].  -SW and VAD team following to see if he would be VAD candidate. HF SW meeting with his sister today. -Currently living with Aunt and sleeps on the floor. Would need more stable living situation prior to VAD.   Length of Stay: 12  [name], MD  2:32 PM  Advanced Heart Failure Team Pager [phone] (M-F; 7a - 5p)  Please contact [facility] for night-coverage after hours (5p -7a ) and weekends on [email or web address]

## 2022-11-07 ENCOUNTER — Inpatient Hospital Stay (HOSPITAL_COMMUNITY): Payer: Medicaid Other

## 2022-11-07 DIAGNOSIS — I5023 Acute on chronic systolic (congestive) heart failure: Secondary | ICD-10-CM | POA: Diagnosis not present

## 2022-11-07 LAB — COOXEMETRY PANEL
Carboxyhemoglobin: 1.8 % — ABNORMAL HIGH (ref 0.5–1.5)
Methemoglobin: <0.7 % (ref 0.0–1.5)
O2 Saturation: 58.2 %
Total hemoglobin: 13.1 g/dL (ref 12.0–16.0)

## 2022-11-07 LAB — GLUCOSE, CAPILLARY
Glucose-Capillary: 116 mg/dL — ABNORMAL HIGH (ref 70–99)
Glucose-Capillary: 153 mg/dL — ABNORMAL HIGH (ref 70–99)
Glucose-Capillary: 115 mg/dL — ABNORMAL HIGH (ref 70–99)
Glucose-Capillary: 129 mg/dL — ABNORMAL HIGH (ref 70–99)

## 2022-11-07 LAB — BASIC METABOLIC PANEL
Anion gap: 9 (ref 5–15)
BUN: 33 mg/dL — ABNORMAL HIGH (ref 6–20)
CO2: 24 mmol/L (ref 22–32)
Calcium: 8.2 mg/dL — ABNORMAL LOW (ref 8.9–10.3)
Chloride: 102 mmol/L (ref 98–111)
Creatinine, Ser: 1.75 mg/dL — ABNORMAL HIGH (ref 0.61–1.24)
GFR, Estimated: 47 mL/min — ABNORMAL LOW (ref >60)
Glucose, Bld: 188 mg/dL — ABNORMAL HIGH (ref 70–99)
Potassium: 3.6 mmol/L (ref 3.5–5.1)
Sodium: 135 mmol/L (ref 135–145)

## 2022-11-07 MED ORDER — TORSEMIDE 20 MG PO TABS
80.0000 mg | ORAL_TABLET | Freq: Every day | ORAL | Status: DC
  Administered 2022-11-08: 80 mg via ORAL
  Filled 2022-11-07: qty 4

## 2022-11-07 MED ORDER — FUROSEMIDE 10 MG/ML IJ SOLN
80.0000 mg | Freq: Once | INTRAMUSCULAR | Status: AC
  Administered 2022-11-07: 80 mg via INTRAVENOUS
  Filled 2022-11-07: qty 8

## 2022-11-07 MED ORDER — GUAIFENESIN ER 600 MG PO TB12
600.0000 mg | ORAL_TABLET | Freq: Two times a day (BID) | ORAL | Status: DC
  Administered 2022-11-07 – 2022-11-08 (×4): 600 mg via ORAL
  Filled 2022-11-07 (×4): qty 1

## 2022-11-07 MED ORDER — DEXTROMETHORPHAN POLISTIREX ER 30 MG/5ML PO SUER
30.0000 mg | Freq: Two times a day (BID) | ORAL | Status: DC
  Administered 2022-11-07 – 2022-11-08 (×3): 30 mg via ORAL
  Filled 2022-11-07 (×5): qty 5

## 2022-11-07 MED ORDER — POTASSIUM CHLORIDE CRYS ER 20 MEQ PO TBCR
40.0000 meq | EXTENDED_RELEASE_TABLET | Freq: Every day | ORAL | Status: DC
  Administered 2022-11-07 – 2022-11-08 (×2): 40 meq via ORAL
  Filled 2022-11-07 (×3): qty 2

## 2022-11-07 MED ORDER — BENZONATATE 100 MG PO CAPS
100.0000 mg | ORAL_CAPSULE | Freq: Three times a day (TID) | ORAL | Status: DC
  Administered 2022-11-07 – 2022-11-08 (×6): 100 mg via ORAL
  Filled 2022-11-07 (×6): qty 1

## 2022-11-07 NOTE — Progress Notes (Signed)
[facility] PROGRESS NOTE  Patient: Douglas [name] QQV:[id number]   PCP: Claiborne Rigg, NP DOB: [birth year]   DOA: 10/25/2022   DOS: 11/07/2022    Subjective: No nausea no vomiting no fever no chills.  Reports continues to have some cough.  No chest pain or chest tightness.  Objective:   Vitals:   11/07/22 0300 11/07/22 0427 11/07/22 0540 11/07/22 1609  BP:  (!) 142/97 119/83 130/87  Pulse:  86    Resp:  18  20  Temp:  98 F (36.7 C)  98.1 F (36.7 C)  TempSrc:  Oral  Oral  SpO2:  91%    Weight: 123 kg     Height:        S1-S2 present. Clear to auscultation. Bowel sound present. Trace edema in the lower extremities.  Assessment and plan: Active Issues Acute on chronic HFrEF.  Nonischemic cardiomyopathy. Advanced heart failure services following. Scheduled for mTEER on 12/26.  Cough. Will modify cough regimen.  Chest x-ray did not does not show any evidence of pneumonia Does show vascular congestion.  Continue diuresis.  Brief Hospital course Douglas [name] is a 46 y.o. M with sCHF EF 20% s/p ICD, polysubstance abuse, HTN, obesity, and CAD who presented with progressive shortness of breath.   12/11 Admitted for acute on chronic systolic CHF on IV diuretics 12/12 Advanced HF team consulted 12/13 Milrinone started 12/20 CT surgery and LVAD SW consulted  12/23 currently scheduled for mTEER 12/26   Principal Problem:   Acute on chronic systolic CHF (congestive heart failure) (HCC) Active Problems:   Acute renal failure superimposed on stage IIIb chronic kidney disease (HCC)   Diabetes mellitus with stage 2 chronic kidney disease (HCC)   Hyperlipidemia   Essential hypertension   Cocaine use   Obesity   Depression, major, single episode   Nonrheumatic mitral (valve) insufficiency   NSVT (nonsustained ventricular tachycardia) (HCC)   Anxiety    Author: Lynden Oxford, MD [facility] 11/07/2022 7:20 PM   If 7PM-7AM, please contact night-coverage at  [email or web address]

## 2022-11-07 NOTE — Progress Notes (Signed)
Advanced Heart Failure Rounding Note  PCP-Cardiologist: None   Subjective:    Remains on milrinone 0.25. Co-ox 61-> 58%  Remains SOB with minimal exertion   Scr improved 1.98 -> 1.75  Weight climbing slowly on po torsemide CVP 14  Seen by Structural Heart Team and plan mTEER on 12/26   Objective:   Weight Range: 123 kg Body mass index is 33.01 kg/m.   Vital Signs:   Temp:  [97.9 F (36.6 C)-98 F (36.7 C)] 98 F (36.7 C) (12/24 0427) Pulse Rate:  [81-92] 86 (12/24 0427) Resp:  [16-18] 18 (12/24 0427) BP: (107-142)/(75-97) 119/83 (12/24 0540) SpO2:  [91 %-98 %] 91 % (12/24 0427) Weight:  [123 kg] 123 kg (12/24 0300) Last BM Date : 11/05/22  Weight change: Filed Weights   11/05/22 0500 11/06/22 0426 11/07/22 0300  Weight: 121.7 kg 122.5 kg 123 kg    Intake/Output:   Intake/Output Summary (Last 24 hours) at 11/07/2022 0932 Last data filed at 11/07/2022 0837 Gross per 24 hour  Intake 1454.24 ml  Output 2800 ml  Net -1345.76 ml      Physical Exam    General: Lying in bed No resp difficulty HEENT: normal Neck: supple. JVP to jaw. Carotids 2+ bilat; no bruits. No lymphadenopathy or thryomegaly appreciated. Cor: PMI laterally displaced. Regular rate & rhythm. 2/6 MR Lungs: clear Abdomen: soft, nontender, nondistended. No hepatosplenomegaly. No bruits or masses. Good bowel sounds. Extremities: no cyanosis, clubbing, rash, edema Neuro: alert & orientedx3, cranial nerves grossly intact. moves all 4 extremities w/o difficulty. Affect pleasant    Telemetry    SR 80-90s Personally reviewed   Labs    CBC Recent Labs    11/05/22 0814  HGB 14.6  15.0  HCT 43.0  44.0    Basic Metabolic Panel Recent Labs    58/85/02 0512 11/05/22 0814 11/06/22 0315 11/07/22 0600  NA 136   < > 135 135  K 4.4   < > 3.7 3.6  CL 102  --  102 102  CO2 24  --  24 24  GLUCOSE 116*  --  148* 188*  BUN 33*  --  38* 33*  CREATININE 1.86*  --  1.98* 1.75*   CALCIUM 9.0  --  8.5* 8.2*  MG 2.2  --   --   --    < > = values in this interval not displayed.    Liver Function Tests No results for input(s): "AST", "ALT", "ALKPHOS", "BILITOT", "PROT", "ALBUMIN" in the last 72 hours.  No results for input(s): "LIPASE", "AMYLASE" in the last 72 hours. Cardiac Enzymes No results for input(s): "CKTOTAL", "CKMB", "CKMBINDEX", "TROPONINI" in the last 72 hours.  BNP: BNP (last 3 results) Recent Labs    10/18/22 1821 10/25/22 1442 10/28/22 1825  BNP 1,494.7* 1,428.2* 491.6*     ProBNP (last 3 results) No results for input(s): "PROBNP" in the last 8760 hours.   D-Dimer No results for input(s): "DDIMER" in the last 72 hours. Hemoglobin A1C No results for input(s): "HGBA1C" in the last 72 hours.  Fasting Lipid Panel No results for input(s): "CHOL", "HDL", "LDLCALC", "TRIG", "CHOLHDL", "LDLDIRECT" in the last 72 hours. Thyroid Function Tests No results for input(s): "TSH", "T4TOTAL", "T3FREE", "THYROIDAB" in the last 72 hours.  Invalid input(s): "FREET3"   Other results:   Imaging    No results found.   Medications:     Scheduled Medications:  albuterol  2.5 mg Nebulization Once   aspirin  81  mg Oral Daily   atorvastatin  80 mg Oral Daily   Chlorhexidine Gluconate Cloth  6 each Topical Daily   dapagliflozin propanediol  10 mg Oral QAC breakfast   enoxaparin (LOVENOX) injection  60 mg Subcutaneous Q24H   hydrALAZINE  25 mg Oral Q8H   insulin aspart  0-15 Units Subcutaneous TID WC   insulin glargine-yfgn  15 Units Subcutaneous Daily   isosorbide mononitrate  30 mg Oral Daily   sodium chloride flush  10-40 mL Intracatheter Q12H   sodium chloride flush  3 mL Intravenous Q12H   sodium chloride flush  3 mL Intravenous Q12H   spironolactone  12.5 mg Oral Daily   torsemide  40 mg Oral Daily    Infusions:  sodium chloride     amiodarone 30 mg/hr (11/07/22 0420)   milrinone 0.25 mcg/kg/min (11/07/22 0833)    PRN  Medications: sodium chloride, acetaminophen, ALPRAZolam, guaiFENesin-dextromethorphan, melatonin, polyethylene glycol, prochlorperazine, sodium chloride flush, sodium chloride flush    Patient Profile   Mr. Lofaso is a 46 year old with a history of HTN, hyperlipidemia, smoker, cocaine abuse,NSVT,  NICM, Medtronic ICD, and chronic HFrEF. Last used cocaine about a week ago.  EF has been down for some time.    Admitted recently with A/C HFrEF. Suspect he had residual congestion when discharged.  Readmitted 12/11 with A/C HFrEF.  Assessment/Plan   1. A/C HFrEF  - NICM. Has Medtronic ICD. 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   - Admit early December. Suspect still had volume on board at discharge + increased fluid intake leading to readmission several days later. - NYHA IV on admit - TEE this admit EF 20%, severe central MR - CO-OX 58% on milrinone 0.25 - Volume status climbing. Will give IV lasix today  - RHC on 11/05/22 RA 2 PCW 33 (v = 48) CI ~2.0  - Continue spiro 12.5 mg daily - Off carvedilol with low output - Continue farxiga - Continue hydral and imdur  - Continue to hold Entresto for now with renal impairment - Has end-stage HF and appears that he is now inotrope dependent. This may be the last opportunity to consider advanced therapies including VAD. However, this decision is complicated by ongoing drug use and difficult social situation. Has been turned down for VAD due primarily due to limited social support.Plan mTEER 12.26   2. AKI on CKD Stage IIIb - Creatinine on admit 2. Baseline previously ~ 1.6.  - Cr 1.98 -> 1.75 - Diuretics as above. - Continue inotrope support   3. Substance Abuse - Last used cocaine about a week PTA. - Discussed cessation.  - reports he is motivated to quit so he can qualify for VAD  4. CAD - LHC 02/2022 nonobstructive CAD - LDL 124  - Continue atorva   5. DMII -On SSI -Continue Farxiga   6. HTN  - BP  stable   7.  NSVT - Previously on amio but he stopped in April of this year.  I am not sure why he stopped.  - Frequent burst of NSVT noted on device since the beginning of December.  - Now on IV amio d/t frequent runs NSVT.  - Continue IV amio while on inotrope support, seems to be having less VT last couple of days - Keep K > 4.0 Mg > 2.0  - Off Coreg with low-output   8. Obesity  - Body mass index is 33.01 kg/m.   9. MR  - TEE 12/14:  EF 20%, Severe central MR - As above -> mTEER 12/26  10. Mediastinal lymph nodes, GGO RUL - Noted on preVAD CT - Will need repeat imaging in 3-6 months to follow   SDOH: -Has medicaid -Works part-time at [employer].  -SW and VAD team following to see if he would be VAD candidate. HF SW meeting with his sister today. -Currently living with Aunt and sleeps on the floor. Would need more stable living situation prior to VAD.   Length of Stay: 13  [name], MD  9:32 AM  Advanced Heart Failure Team Pager [phone] (M-F; 7a - 5p)  Please contact [facility] for night-coverage after hours (5p -7a ) and weekends on [email or web address]

## 2022-11-08 DIAGNOSIS — I5023 Acute on chronic systolic (congestive) heart failure: Secondary | ICD-10-CM | POA: Diagnosis not present

## 2022-11-08 LAB — BASIC METABOLIC PANEL
Anion gap: 9 (ref 5–15)
BUN: 35 mg/dL — ABNORMAL HIGH (ref 6–20)
CO2: 24 mmol/L (ref 22–32)
Calcium: 8.7 mg/dL — ABNORMAL LOW (ref 8.9–10.3)
Chloride: 101 mmol/L (ref 98–111)
Creatinine, Ser: 1.76 mg/dL — ABNORMAL HIGH (ref 0.61–1.24)
GFR, Estimated: 47 mL/min — ABNORMAL LOW (ref >60)
Glucose, Bld: 126 mg/dL — ABNORMAL HIGH (ref 70–99)
Potassium: 3.8 mmol/L (ref 3.5–5.1)
Sodium: 134 mmol/L — ABNORMAL LOW (ref 135–145)

## 2022-11-08 LAB — CBC
HCT: 37.5 % — ABNORMAL LOW (ref 39.0–52.0)
Hemoglobin: 13.1 g/dL (ref 13.0–17.0)
MCH: 29.2 pg (ref 26.0–34.0)
MCHC: 34.9 g/dL (ref 30.0–36.0)
MCV: 83.5 fL (ref 80.0–100.0)
Platelets: 250 10*3/uL (ref 150–400)
RBC: 4.49 MIL/uL (ref 4.22–5.81)
RDW: 13.1 % (ref 11.5–15.5)
WBC: 8.2 10*3/uL (ref 4.0–10.5)
nRBC: 0 % (ref 0.0–0.2)

## 2022-11-08 LAB — COOXEMETRY PANEL
Carboxyhemoglobin: 1.9 % — ABNORMAL HIGH (ref 0.5–1.5)
Methemoglobin: 1 % (ref 0.0–1.5)
O2 Saturation: 57.2 %
Total hemoglobin: 13.7 g/dL (ref 12.0–16.0)

## 2022-11-08 LAB — GLUCOSE, CAPILLARY
Glucose-Capillary: 128 mg/dL — ABNORMAL HIGH (ref 70–99)
Glucose-Capillary: 146 mg/dL — ABNORMAL HIGH (ref 70–99)
Glucose-Capillary: 117 mg/dL — ABNORMAL HIGH (ref 70–99)
Glucose-Capillary: 282 mg/dL — ABNORMAL HIGH (ref 70–99)

## 2022-11-08 LAB — MAGNESIUM: Magnesium: 2.1 mg/dL (ref 1.7–2.4)

## 2022-11-08 MED ORDER — POTASSIUM CHLORIDE CRYS ER 20 MEQ PO TBCR
20.0000 meq | EXTENDED_RELEASE_TABLET | Freq: Once | ORAL | Status: AC
  Administered 2022-11-08: 20 meq via ORAL
  Filled 2022-11-08: qty 1

## 2022-11-08 MED ORDER — POTASSIUM CHLORIDE CRYS ER 20 MEQ PO TBCR
40.0000 meq | EXTENDED_RELEASE_TABLET | Freq: Once | ORAL | Status: AC
  Administered 2022-11-08: 40 meq via ORAL
  Filled 2022-11-08: qty 2

## 2022-11-08 NOTE — Anesthesia Preprocedure Evaluation (Addendum)
Anesthesia Evaluation  Patient identified by MRN, date of birth, ID band Patient awake    Reviewed: Allergy & Precautions, NPO status , Patient's Chart, lab work & pertinent test results  Airway Mallampati: II  TM Distance: >3 FB Neck ROM: Full    Dental  (+) Dental Advisory Given, Teeth Intact   Pulmonary Current Smoker and Patient abstained from smoking.   breath sounds clear to auscultation + decreased breath sounds      Cardiovascular hypertension, Pt. on home beta blockers pulmonary hypertension+ CAD, +CHF and + DVT  + Cardiac Defibrillator (for NSVT) + Valvular Problems/Murmurs (Mod TR) MR  Rhythm:Regular Rate:Normal + Systolic murmurs RHC 11/06/2022 Assessment: 1, Volume overload with low output despite milrinone support 2. Prominent v waves in PCWP tracing c/w known severe MR 3. Normal PAPi suggestive of preserved RV function      TEE 10/28/2022  LEFT VENTRICLE: Dilated EF = 20%. Global HK RIGHT VENTRICLE: Moderately reduced LEFT ATRIUM: Severely dilated with smoke LEFT ATRIAL APPENDAGE: No thrombus.  RIGHT ATRIUM: Moderately dilated AORTIC VALVE:  Trileaflet. Mildly calcified No AI MITRAL VALVE:    Normal. Severe central functional MR with mild restriction of posterior leaflet. No significant flow reversal in pulmonary veins TRICUSPID VALVE: Normal. Moderate TR PULMONIC VALVE: Grossly normal. Trivial PI INTERATRIAL SEPTUM: No PFO or ASD. PERICARDIUM: No effusion DESCENDING AORTA: Mild plque    Non ischemic Cardiomyopathy  10/26/2022 TTE 1. Left ventricular ejection fraction, by estimation, is 20 to 25%. The left ventricle has severely decreased function. The left ventricle demonstrates global hypokinesis. The left ventricular internal cavity size was severely dilated. Left ventricular diastolic parameters are indeterminate.   2. Right ventricular systolic function is low normal. The right ventricular size is  severely enlarged. There is severely elevated pulmonary artery systolic pressure.   3. Left atrial size was severely dilated.   4. Right atrial size was severely dilated.   5. The mitral valve is abnormal. Moderate mitral valve regurgitation. No evidence of mitral stenosis.   6. Tricuspid valve regurgitation is moderate.   7. The aortic valve is normal in structure. Aortic valve regurgitation is not visualized. No aortic stenosis is present.      Neuro/Psych Seizures -,  PSYCHIATRIC DISORDERS Anxiety Depression       GI/Hepatic   Endo/Other  diabetes    Renal/GU Renal InsufficiencyRenal diseaseLab Results      Component                Value               Date                      CREATININE               1.87 (H)            10/27/2022                BUN                      32 (H)              10/27/2022                NA                       138  10/27/2022                K                        4.8                 10/27/2022                CL                       103                 10/27/2022                CO2                      27                  10/27/2022                Musculoskeletal   Abdominal  (+) + obese (BMI 33.1)  Peds  Hematology Lab Results      Component                Value               Date                      WBC                      8.1                 10/26/2022                HGB                      15.7                10/26/2022                HCT                      47.0                10/26/2022                MCV                      86.4                10/26/2022                PLT                      271                 10/26/2022           On Lovenox   Anesthesia Other Findings All: PCn  Reproductive/Obstetrics                             Anesthesia Physical Anesthesia Plan  ASA: 5  Anesthesia Plan: General   Post-op Pain Management: Minimal or no pain anticipated   Induction:  Intravenous  PONV Risk Score and Plan: 1 and Treatment  may vary due to age or medical condition, Ondansetron and Dexamethasone  Airway Management Planned: Oral ETT  Additional Equipment: Arterial line  Intra-op Plan:   Post-operative Plan: Possible Post-op intubation/ventilation  Informed Consent: I have reviewed the patients History and Physical, chart, labs and discussed the procedure including the risks, benefits and alternatives for the proposed anesthesia with the patient or authorized representative who has indicated his/her understanding and acceptance.     Dental advisory given  Plan Discussed with: CRNA  Anesthesia Plan Comments:        Anesthesia Quick Evaluation

## 2022-11-08 NOTE — Progress Notes (Addendum)
Advanced Heart Failure Rounding Note  PCP-Cardiologist: None   Subjective:    Remains on milrinone 0.25. Co-ox 61-> 58% -> 57%  Remains SOB with minimal exertion   IV lasix given yesterday. Now on torsemide 80 daily.  4.3L out. Weight down 1 pound. Drinking a lot of fluid.  CVp 10   Scr stable at 1.76  Seen by Structural Heart Team and plan mTEER tomorrow   Objective:   Weight Range: 122.5 kg Body mass index is 32.87 kg/m.   Vital Signs:   Temp:  [97.7 F (36.5 C)-98.2 F (36.8 C)] 97.7 F (36.5 C) (12/25 1143) Pulse Rate:  [74-92] 92 (12/25 1143) Resp:  [18-20] 20 (12/25 1143) BP: (102-136)/(77-88) 102/79 (12/25 1143) SpO2:  [92 %-97 %] 92 % (12/25 1143) Weight:  [122.5 kg] 122.5 kg (12/25 0410) Last BM Date : 11/07/22  Weight change: Filed Weights   11/06/22 0426 11/07/22 0300 11/08/22 0410  Weight: 122.5 kg 123 kg 122.5 kg    Intake/Output:   Intake/Output Summary (Last 24 hours) at 11/08/2022 1153 Last data filed at 11/08/2022 0850 Gross per 24 hour  Intake 1914.55 ml  Output 3490 ml  Net -1575.45 ml      Physical Exam    General:  Sitting up in bed No resp difficulty HEENT: normal Neck: supple. JVP 10  Carotids 2+ bilat; no bruits. No lymphadenopathy or thryomegaly appreciated. Cor: PMI nondisplaced. Regular rate & rhythm. 3/6 MR Lungs: clear Abdomen: soft, nontender, nondistended. No hepatosplenomegaly. No bruits or masses. Good bowel sounds. Extremities: no cyanosis, clubbing, rash, edema Neuro: alert & orientedx3, cranial nerves grossly intact. moves all 4 extremities w/o difficulty. Affect pleasant    Telemetry    SR 80-90s Personally reviewed   Labs    CBC Recent Labs    11/08/22 0430  WBC 8.2  HGB 13.1  HCT 37.5*  MCV 83.5  PLT 250    Basic Metabolic Panel Recent Labs    79/15/05 0600 11/08/22 0430  NA 135 134*  K 3.6 3.8  CL 102 101  CO2 24 24  GLUCOSE 188* 126*  BUN 33* 35*  CREATININE 1.75* 1.76*   CALCIUM 8.2* 8.7*  MG  --  2.1    Liver Function Tests No results for input(s): "AST", "ALT", "ALKPHOS", "BILITOT", "PROT", "ALBUMIN" in the last 72 hours.  No results for input(s): "LIPASE", "AMYLASE" in the last 72 hours. Cardiac Enzymes No results for input(s): "CKTOTAL", "CKMB", "CKMBINDEX", "TROPONINI" in the last 72 hours.  BNP: BNP (last 3 results) Recent Labs    10/18/22 1821 10/25/22 1442 10/28/22 1825  BNP 1,494.7* 1,428.2* 491.6*     ProBNP (last 3 results) No results for input(s): "PROBNP" in the last 8760 hours.   D-Dimer No results for input(s): "DDIMER" in the last 72 hours. Hemoglobin A1C No results for input(s): "HGBA1C" in the last 72 hours.  Fasting Lipid Panel No results for input(s): "CHOL", "HDL", "LDLCALC", "TRIG", "CHOLHDL", "LDLDIRECT" in the last 72 hours. Thyroid Function Tests No results for input(s): "TSH", "T4TOTAL", "T3FREE", "THYROIDAB" in the last 72 hours.  Invalid input(s): "FREET3"   Other results:   Imaging    No results found.   Medications:     Scheduled Medications:  albuterol  2.5 mg Nebulization Once   aspirin  81 mg Oral Daily   atorvastatin  80 mg Oral Daily   benzonatate  100 mg Oral TID   Chlorhexidine Gluconate Cloth  6 each Topical Daily   dapagliflozin  propanediol  10 mg Oral QAC breakfast   dextromethorphan  30 mg Oral BID   enoxaparin (LOVENOX) injection  60 mg Subcutaneous Q24H   guaiFENesin  600 mg Oral BID   hydrALAZINE  25 mg Oral Q8H   insulin aspart  0-15 Units Subcutaneous TID WC   insulin glargine-yfgn  15 Units Subcutaneous Daily   isosorbide mononitrate  30 mg Oral Daily   potassium chloride  40 mEq Oral Daily   sodium chloride flush  10-40 mL Intracatheter Q12H   sodium chloride flush  3 mL Intravenous Q12H   sodium chloride flush  3 mL Intravenous Q12H   spironolactone  12.5 mg Oral Daily   torsemide  80 mg Oral Daily    Infusions:  sodium chloride     amiodarone 30 mg/hr  (11/08/22 0551)   milrinone 0.25 mcg/kg/min (11/08/22 0850)    PRN Medications: sodium chloride, acetaminophen, ALPRAZolam, melatonin, polyethylene glycol, prochlorperazine, sodium chloride flush, sodium chloride flush    Patient Profile   Douglas [name] is a 46 year old with a history of HTN, hyperlipidemia, smoker, cocaine abuse,NSVT,  NICM, Medtronic ICD, and chronic HFrEF. Last used cocaine about a week ago.  EF has been down for some time.    Admitted recently with A/C HFrEF. Suspect he had residual congestion when discharged.  Readmitted 12/11 with A/C HFrEF.  Assessment/Plan   1. A/C HFrEF  - NICM. Has Medtronic ICD. 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   - Admit early December. Suspect still had volume on board at discharge + increased fluid intake leading to readmission several days later. - NYHA IV on admit - TEE this admit EF 20%, severe central MR - CO-OX 57% on milrinone 0.25 - Volume status up. CVP 10 Continue torsemide 80 daily - RHC on 11/05/22 RA 2 PCW 33 (v = 48) CI ~2.0  - Continue spiro 12.5 mg daily - Off carvedilol with low output - Continue farxiga - Continue hydral and imdur  - Continue to hold Entresto for now with renal impairment - Has end-stage HF and appears that he is now inotrope dependent. This may be the last opportunity to consider advanced therapies including VAD. However, this decision is complicated by ongoing drug use and difficult social situation. Has been turned down for VAD due primarily due to limited social support. Plan mTEER tomorrow   2. AKI on CKD Stage IIIb - Creatinine on admit 2. Baseline previously ~ 1.6.  - Cr 1.98 -> 1.75 -> 1.75 - Diuretics as above. - Continue inotrope support   3. Substance Abuse - Last used cocaine about a week PTA. - Discussed cessation.  - reports he is motivated to quit so he can qualify for VAD  4. CAD - LHC 02/2022 nonobstructive CAD - LDL 124  - Continue atorva   5. DMII -On  SSI -Continue Farxiga   6. HTN  - BP  stable   7. NSVT - Previously on amio but he stopped in April of this year.  I am not sure why he stopped.  - Frequent burst of NSVT noted on device since the beginning of December.  - Now on IV amio d/t frequent runs NSVT.  - Continue IV amio while on inotrope support, seems to be having less VT last couple of days - Keep K > 4.0 Mg > 2.0  - Off Coreg with low-output   8. Obesity  - Body mass index is 32.87 kg/m.   9. MR  -  TEE 12/14: EF 20%, Severe central MR - As above -> mTEER 12/26  10. Mediastinal lymph nodes, GGO RUL - Noted on preVAD CT - Will need repeat imaging in 3-6 months to follow  11. Hypokalemia - supp   SDOH: -Has medicaid -Works part-time at [employer].  -SW and VAD team following to see if he would be VAD candidate. HF SW meeting with his sister today. -Currently living with Aunt and sleeps on the floor. Would need more stable living situation prior to VAD.   Length of Stay: 14  Douglas Meres, MD  11:53 AM  Advanced Heart Failure Team Pager [phone] (M-F; 7a - 5p)  Please contact [facility] for night-coverage after hours (5p -7a ) and weekends on [email or web address]

## 2022-11-08 NOTE — Progress Notes (Signed)
[facility] Progress Note Patient: Douglas [name] ZTI:[id number] DOB: 1976/02/22 DOA: 10/25/2022  DOS: the patient was seen and examined on 11/08/2022  Brief hospital course: Mr. Morris is a 46 y.o. M with sCHF EF 20% s/p ICD, polysubstance abuse, HTN, obesity, and CAD who presented with progressive shortness of breath.   12/11 Admitted for acute on chronic systolic CHF on IV diuretics 12/12 Advanced HF team consulted 12/13 Milrinone started 12/20 CT surgery and LVAD SW consulted  12/23 currently scheduled for mTEER 12/26 Assessment and Plan: Acute on chronic HFrEF. Nonischemic. SP Medtronic ICD implant. Severe central MR. Advanced heart failure team currently following.  Currently on Primacor.  Per cardiology now inotrope dependent. Underwent TEE.  EF 20%.  Has severe central MR SP RHC on 12/22 Currently on Farxiga, hydralazine, Imdur started on Aldactone.  Continue torsemide. Has not been a great candidate for VAD due to his drug use (cocaine) and difficult social situation. Currently planned for mTEER on 12/26.   AKI on CKD 3B. Baseline creatinine around 1.6.  On admission creatinine 2.  Currently stable.  Monitor.   Frequent NSVT. On amiodarone infusion.  Continuing IV while on inotrope support. Maintain K more than 4.  Mag more than 2. Beta-blocker currently on hold due to low output.   CAD. Nonobstructive. Left heart cath April 2023.   HLD. Continue statin.   Substance abuse. Cocaine use 1 week prior to admission.  UDS negative in the hospital.  (Performed after 7 days of hospital stay) Monitor.   Obesity. Body mass index is 32.88 kg/m.  Placing the patient at high risk for poor outcome.   Bilateral groundglass opacities.  Mediastinal lymphadenopathy More on the right side. Concerning for infection/inflammation.  Seen on the CT scan on 12/19. Follow-up CT in 3 months recommended.   Anxiety and depression. On Xanax.  Continue.   Subjective: No  nausea no vomiting.  No fever no chills.  No chest pain.  Continues to have severe cough.  Physical Exam: General: in mild distress;  Cardiovascular: S1 and S2 Present, no Murmur Respiratory: Non tender  respiratory effort, Bilateral Air entry present, no Crackles, no wheezes Abdomen: Bowel Sound present, Non tender  Extremities: no edema Neurology: alert and oriented to time, place, and person   Data Reviewed: I have Reviewed nursing notes, Vitals, and Lab results. Since last encounter, pertinent lab results CBC and BMP   . I have ordered test including CBC and BMP  .   Disposition: Status is: Inpatient Remains inpatient appropriate because: Need further therapy for his heart failure.  Family Communication: No one at bedside Level of care: Telemetry Cardiac  Vitals:   11/07/22 1941 11/08/22 0410 11/08/22 0858 11/08/22 1143  BP: 136/88 129/77 111/80 102/79  Pulse: 86 74 90 92  Resp: 18 18 18 20   Temp: 98 F (36.7 C) 98.2 F (36.8 C) 97.8 F (36.6 C) 97.7 F (36.5 C)  TempSrc: Oral Oral Oral Oral  SpO2: 97% 95% 97% 92%  Weight:  122.5 kg    Height:         Author: , MD 11/08/2022 2:10 PM  Please look on [email or web address] to find out who is on call.

## 2022-11-08 NOTE — Progress Notes (Addendum)
PT had 7 beat run of Vtach. Dr. Edsel Petrin notified of event and current status, HR 82 combination of sinus rhythm with intermittent afib. Asymptomatic. Denies feeling different or any changes even during event. Currently resting. Dr. Edsel Petrin acknowledged notification.

## 2022-11-09 ENCOUNTER — Encounter (HOSPITAL_COMMUNITY): Payer: Self-pay | Admitting: Internal Medicine

## 2022-11-09 ENCOUNTER — Inpatient Hospital Stay (HOSPITAL_COMMUNITY): Payer: Medicaid Other | Admitting: Anesthesiology

## 2022-11-09 ENCOUNTER — Inpatient Hospital Stay (HOSPITAL_COMMUNITY): Payer: Medicaid Other

## 2022-11-09 ENCOUNTER — Encounter (HOSPITAL_COMMUNITY)
Admission: EM | Disposition: A | Discharge status: Hospice | Payer: Medicaid Other | Source: Home / Self Care | Attending: Pulmonary Disease

## 2022-11-09 DIAGNOSIS — I340 Nonrheumatic mitral (valve) insufficiency: Secondary | ICD-10-CM

## 2022-11-09 DIAGNOSIS — I2510 Atherosclerotic heart disease of native coronary artery without angina pectoris: Secondary | ICD-10-CM | POA: Diagnosis not present

## 2022-11-09 DIAGNOSIS — I509 Heart failure, unspecified: Secondary | ICD-10-CM

## 2022-11-09 DIAGNOSIS — I110 Hypertensive heart disease with heart failure: Secondary | ICD-10-CM

## 2022-11-09 DIAGNOSIS — Z9911 Dependence on respirator [ventilator] status: Secondary | ICD-10-CM

## 2022-11-09 DIAGNOSIS — J810 Acute pulmonary edema: Secondary | ICD-10-CM

## 2022-11-09 DIAGNOSIS — F17210 Nicotine dependence, cigarettes, uncomplicated: Secondary | ICD-10-CM

## 2022-11-09 DIAGNOSIS — Z006 Encounter for examination for normal comparison and control in clinical research program: Secondary | ICD-10-CM

## 2022-11-09 DIAGNOSIS — I5023 Acute on chronic systolic (congestive) heart failure: Secondary | ICD-10-CM | POA: Diagnosis not present

## 2022-11-09 HISTORY — PX: TEE WITHOUT CARDIOVERSION: SHX5443

## 2022-11-09 HISTORY — PX: TRANSCATHETER MITRAL EDGE TO EDGE REPAIR: CATH118311

## 2022-11-09 LAB — TYPE AND SCREEN
ABO/RH(D): A NEG
Antibody Screen: NEGATIVE

## 2022-11-09 LAB — BASIC METABOLIC PANEL
Anion gap: 8 (ref 5–15)
Anion gap: 11 (ref 5–15)
BUN: 32 mg/dL — ABNORMAL HIGH (ref 6–20)
BUN: 34 mg/dL — ABNORMAL HIGH (ref 6–20)
CO2: 24 mmol/L (ref 22–32)
CO2: 18 mmol/L — ABNORMAL LOW (ref 22–32)
Calcium: 8.8 mg/dL — ABNORMAL LOW (ref 8.9–10.3)
Calcium: 8.8 mg/dL — ABNORMAL LOW (ref 8.9–10.3)
Chloride: 104 mmol/L (ref 98–111)
Chloride: 107 mmol/L (ref 98–111)
Creatinine, Ser: 1.68 mg/dL — ABNORMAL HIGH (ref 0.61–1.24)
Creatinine, Ser: 2.22 mg/dL — ABNORMAL HIGH (ref 0.61–1.24)
GFR, Estimated: 50 mL/min — ABNORMAL LOW (ref >60)
GFR, Estimated: 36 mL/min — ABNORMAL LOW (ref >60)
Glucose, Bld: 123 mg/dL — ABNORMAL HIGH (ref 70–99)
Glucose, Bld: 215 mg/dL — ABNORMAL HIGH (ref 70–99)
Potassium: 4.1 mmol/L (ref 3.5–5.1)
Potassium: 4.8 mmol/L (ref 3.5–5.1)
Sodium: 136 mmol/L (ref 135–145)
Sodium: 136 mmol/L (ref 135–145)

## 2022-11-09 LAB — POCT ACTIVATED CLOTTING TIME
Activated Clotting Time: 228 seconds
Activated Clotting Time: 266 seconds
Activated Clotting Time: 271 seconds
Activated Clotting Time: 239 seconds
Activated Clotting Time: 261 seconds
Activated Clotting Time: 250 seconds
Activated Clotting Time: 255 seconds
Activated Clotting Time: 282 seconds

## 2022-11-09 LAB — POCT I-STAT 7, (LYTES, BLD GAS, ICA,H+H)
Acid-base deficit: 4 mmol/L — ABNORMAL HIGH (ref 0.0–2.0)
Acid-base deficit: 7 mmol/L — ABNORMAL HIGH (ref 0.0–2.0)
Acid-base deficit: 5 mmol/L — ABNORMAL HIGH (ref 0.0–2.0)
Bicarbonate: 22.2 mmol/L (ref 20.0–28.0)
Bicarbonate: 19.8 mmol/L — ABNORMAL LOW (ref 20.0–28.0)
Bicarbonate: 19.9 mmol/L — ABNORMAL LOW (ref 20.0–28.0)
Calcium, Ion: 1.25 mmol/L (ref 1.15–1.40)
Calcium, Ion: 1.16 mmol/L (ref 1.15–1.40)
Calcium, Ion: 1.14 mmol/L — ABNORMAL LOW (ref 1.15–1.40)
HCT: 42 % (ref 39.0–52.0)
HCT: 44 % (ref 39.0–52.0)
HCT: 40 % (ref 39.0–52.0)
Hemoglobin: 14.3 g/dL (ref 13.0–17.0)
Hemoglobin: 15 g/dL (ref 13.0–17.0)
Hemoglobin: 13.6 g/dL (ref 13.0–17.0)
O2 Saturation: 93 %
O2 Saturation: 96 %
O2 Saturation: 100 %
Patient temperature: 98.7
Potassium: 4.2 mmol/L (ref 3.5–5.1)
Potassium: 5.8 mmol/L — ABNORMAL HIGH (ref 3.5–5.1)
Potassium: 4.5 mmol/L (ref 3.5–5.1)
Sodium: 137 mmol/L (ref 135–145)
Sodium: 135 mmol/L (ref 135–145)
Sodium: 135 mmol/L (ref 135–145)
TCO2: 24 mmol/L (ref 22–32)
TCO2: 21 mmol/L — ABNORMAL LOW (ref 22–32)
TCO2: 21 mmol/L — ABNORMAL LOW (ref 22–32)
pCO2 arterial: 44.4 mmHg (ref 32–48)
pCO2 arterial: 43 mmHg (ref 32–48)
pCO2 arterial: 36.8 mmHg (ref 32–48)
pH, Arterial: 7.308 — ABNORMAL LOW (ref 7.35–7.45)
pH, Arterial: 7.272 — ABNORMAL LOW (ref 7.35–7.45)
pH, Arterial: 7.341 — ABNORMAL LOW (ref 7.35–7.45)
pO2, Arterial: 72 mmHg — ABNORMAL LOW (ref 83–108)
pO2, Arterial: 93 mmHg (ref 83–108)
pO2, Arterial: 354 mmHg — ABNORMAL HIGH (ref 83–108)

## 2022-11-09 LAB — FACTOR 5 LEIDEN

## 2022-11-09 LAB — MAGNESIUM: Magnesium: 1.9 mg/dL (ref 1.7–2.4)

## 2022-11-09 LAB — ECHO TEE
MV M vel: 5.57 m/s
MV Peak grad: 124.1 mmHg
Radius: 1 cm

## 2022-11-09 LAB — GLUCOSE, CAPILLARY
Glucose-Capillary: 140 mg/dL — ABNORMAL HIGH (ref 70–99)
Glucose-Capillary: 123 mg/dL — ABNORMAL HIGH (ref 70–99)
Glucose-Capillary: 225 mg/dL — ABNORMAL HIGH (ref 70–99)
Glucose-Capillary: >600 mg/dL (ref 70–99)

## 2022-11-09 LAB — SURGICAL PCR SCREEN
MRSA, PCR: NEGATIVE
Staphylococcus aureus: NEGATIVE

## 2022-11-09 LAB — COOXEMETRY PANEL
Carboxyhemoglobin: 2.2 % — ABNORMAL HIGH (ref 0.5–1.5)
Carboxyhemoglobin: 1.5 % (ref 0.5–1.5)
Methemoglobin: <0.7 % (ref 0.0–1.5)
Methemoglobin: 0.7 % (ref 0.0–1.5)
O2 Saturation: 61.5 %
O2 Saturation: 94.4 %
Total hemoglobin: 13.8 g/dL (ref 12.0–16.0)
Total hemoglobin: 10.1 g/dL — ABNORMAL LOW (ref 12.0–16.0)

## 2022-11-09 SURGERY — MITRAL VALVE REPAIR
Anesthesia: General

## 2022-11-09 MED ORDER — DOCUSATE SODIUM 50 MG/5ML PO LIQD
100.0000 mg | Freq: Two times a day (BID) | ORAL | Status: DC
  Administered 2022-11-09 – 2022-11-10 (×2): 100 mg
  Filled 2022-11-09 (×2): qty 10

## 2022-11-09 MED ORDER — FENTANYL 2500MCG IN NS 250ML (10MCG/ML) PREMIX INFUSION
50.0000 ug/h | INTRAVENOUS | Status: DC
  Administered 2022-11-09: 50 ug/h via INTRAVENOUS
  Filled 2022-11-09: qty 250

## 2022-11-09 MED ORDER — NOREPINEPHRINE 4 MG/250ML-% IV SOLN
INTRAVENOUS | Status: AC
  Administered 2022-11-09: 16 ug/min via INTRAVENOUS
  Filled 2022-11-09: qty 250

## 2022-11-09 MED ORDER — PANTOPRAZOLE SODIUM 40 MG IV SOLR
40.0000 mg | Freq: Every day | INTRAVENOUS | Status: DC
  Administered 2022-11-09: 40 mg via INTRAVENOUS
  Filled 2022-11-09: qty 10

## 2022-11-09 MED ORDER — ASPIRIN 81 MG PO CHEW
81.0000 mg | CHEWABLE_TABLET | Freq: Every day | ORAL | Status: DC
  Administered 2022-11-10 – 2022-11-11 (×2): 81 mg
  Filled 2022-11-09 (×2): qty 1

## 2022-11-09 MED ORDER — FUROSEMIDE 10 MG/ML IJ SOLN
60.0000 mg | Freq: Three times a day (TID) | INTRAMUSCULAR | Status: AC
  Administered 2022-11-09 – 2022-11-10 (×2): 60 mg via INTRAVENOUS
  Filled 2022-11-09 (×2): qty 6

## 2022-11-09 MED ORDER — SODIUM CHLORIDE 0.9 % IV SOLN: INTRAVENOUS | Status: DC

## 2022-11-09 MED ORDER — SODIUM CHLORIDE 0.9% FLUSH
3.0000 mL | Freq: Two times a day (BID) | INTRAVENOUS | Status: DC
  Administered 2022-11-09 – 2022-11-15 (×8): 3 mL via INTRAVENOUS

## 2022-11-09 MED ORDER — HEPARIN (PORCINE) IN NACL 2000-0.9 UNIT/L-% IV SOLN
INTRAVENOUS | Status: DC | PRN
  Administered 2022-11-09 (×3): 1000 mL

## 2022-11-09 MED ORDER — ONDANSETRON HCL 4 MG/2ML IJ SOLN
INTRAMUSCULAR | Status: DC | PRN
  Administered 2022-11-09: 4 mg via INTRAVENOUS

## 2022-11-09 MED ORDER — POLYETHYLENE GLYCOL 3350 17 G PO PACK
17.0000 g | PACK | Freq: Every day | ORAL | Status: DC
  Administered 2022-11-09 – 2022-11-10 (×2): 17 g
  Filled 2022-11-09 (×2): qty 1

## 2022-11-09 MED ORDER — CHLORHEXIDINE GLUCONATE 0.12 % MT SOLN
15.0000 mL | Freq: Once | OROMUCOSAL | Status: AC
  Administered 2022-11-09: 15 mL via OROMUCOSAL
  Filled 2022-11-09: qty 15

## 2022-11-09 MED ORDER — SODIUM CHLORIDE 0.9 % IV SOLN: 250.0000 mL | INTRAVENOUS | Status: DC | PRN

## 2022-11-09 MED ORDER — HYDROCOD POLI-CHLORPHE POLI ER 10-8 MG/5ML PO SUER
5.0000 mL | Freq: Two times a day (BID) | ORAL | Status: DC
  Administered 2022-11-09: 5 mL via ORAL
  Filled 2022-11-09: qty 5

## 2022-11-09 MED ORDER — REVEFENACIN 175 MCG/3ML IN SOLN
175.0000 ug | Freq: Every day | RESPIRATORY_TRACT | Status: DC
  Administered 2022-11-10 – 2022-11-19 (×10): 175 ug via RESPIRATORY_TRACT
  Filled 2022-11-09 (×10): qty 3

## 2022-11-09 MED ORDER — ETOMIDATE 2 MG/ML IV SOLN
INTRAVENOUS | Status: DC | PRN
  Administered 2022-11-09: 14 mg via INTRAVENOUS

## 2022-11-09 MED ORDER — ATORVASTATIN CALCIUM 80 MG PO TABS
80.0000 mg | ORAL_TABLET | Freq: Every day | ORAL | Status: DC
  Administered 2022-11-10 – 2022-11-11 (×2): 80 mg
  Filled 2022-11-09 (×2): qty 1

## 2022-11-09 MED ORDER — PROPOFOL 1000 MG/100ML IV EMUL
5.0000 ug/kg/min | INTRAVENOUS | Status: DC
  Administered 2022-11-10: 30 ug/kg/min via INTRAVENOUS
  Filled 2022-11-09: qty 200

## 2022-11-09 MED ORDER — NOREPINEPHRINE 4 MG/250ML-% IV SOLN: 0.0000 ug/min | INTRAVENOUS | Status: DC

## 2022-11-09 MED ORDER — HYDRALAZINE HCL 25 MG PO TABS: 25.0000 mg | ORAL_TABLET | Freq: Three times a day (TID) | ORAL | Status: DC

## 2022-11-09 MED ORDER — DEXAMETHASONE SODIUM PHOSPHATE 10 MG/ML IJ SOLN
INTRAMUSCULAR | Status: DC | PRN
  Administered 2022-11-09: 10 mg via INTRAVENOUS

## 2022-11-09 MED ORDER — FAMOTIDINE 20 MG PO TABS: 20.0000 mg | ORAL_TABLET | Freq: Two times a day (BID) | ORAL | Status: DC

## 2022-11-09 MED ORDER — CHLORHEXIDINE GLUCONATE 0.12 % MT SOLN: 15.0000 mL | Freq: Once | OROMUCOSAL | Status: DC

## 2022-11-09 MED ORDER — ORAL CARE MOUTH RINSE: 15.0000 mL | OROMUCOSAL | Status: DC | PRN

## 2022-11-09 MED ORDER — SPIRONOLACTONE 12.5 MG HALF TABLET
12.5000 mg | ORAL_TABLET | Freq: Every day | ORAL | Status: DC
  Filled 2022-11-09: qty 1

## 2022-11-09 MED ORDER — HEPARIN (PORCINE) IN NACL 1000-0.9 UT/500ML-% IV SOLN
INTRAVENOUS | Status: DC | PRN
  Administered 2022-11-09: 500 mL

## 2022-11-09 MED ORDER — ROCURONIUM BROMIDE 10 MG/ML (PF) SYRINGE
PREFILLED_SYRINGE | INTRAVENOUS | Status: DC | PRN
  Administered 2022-11-09 (×3): 30 mg via INTRAVENOUS
  Administered 2022-11-09 (×2): 20 mg via INTRAVENOUS
  Administered 2022-11-09: 70 mg via INTRAVENOUS

## 2022-11-09 MED ORDER — ACETAMINOPHEN 325 MG PO TABS
650.0000 mg | ORAL_TABLET | ORAL | Status: DC | PRN
  Administered 2022-11-13 – 2022-11-17 (×3): 650 mg
  Filled 2022-11-09 (×3): qty 2

## 2022-11-09 MED ORDER — ALPRAZOLAM 0.5 MG PO TABS
0.5000 mg | ORAL_TABLET | Freq: Two times a day (BID) | ORAL | Status: DC | PRN
  Administered 2022-11-12 – 2022-11-17 (×8): 0.5 mg
  Filled 2022-11-09 (×10): qty 1

## 2022-11-09 MED ORDER — PROPOFOL 500 MG/50ML IV EMUL
INTRAVENOUS | Status: DC | PRN
  Administered 2022-11-09: 75 ug/kg/min via INTRAVENOUS

## 2022-11-09 MED ORDER — ARFORMOTEROL TARTRATE 15 MCG/2ML IN NEBU
15.0000 ug | INHALATION_SOLUTION | Freq: Two times a day (BID) | RESPIRATORY_TRACT | Status: DC
  Administered 2022-11-09 – 2022-11-19 (×17): 15 ug via RESPIRATORY_TRACT
  Filled 2022-11-09 (×20): qty 2

## 2022-11-09 MED ORDER — PROTAMINE SULFATE 10 MG/ML IV SOLN
INTRAVENOUS | Status: DC | PRN
  Administered 2022-11-09: 30 mg via INTRAVENOUS

## 2022-11-09 MED ORDER — FENTANYL CITRATE (PF) 100 MCG/2ML IJ SOLN
INTRAMUSCULAR | Status: DC | PRN
  Administered 2022-11-09: 100 ug via INTRAVENOUS

## 2022-11-09 MED ORDER — FENTANYL CITRATE PF 50 MCG/ML IJ SOSY: 50.0000 ug | PREFILLED_SYRINGE | Freq: Once | INTRAMUSCULAR | Status: DC

## 2022-11-09 MED ORDER — HEPARIN SODIUM (PORCINE) 1000 UNIT/ML IJ SOLN
INTRAMUSCULAR | Status: DC | PRN
  Administered 2022-11-09: 4000 [IU] via INTRAVENOUS
  Administered 2022-11-09: 3000 [IU] via INTRAVENOUS
  Administered 2022-11-09: 10000 [IU] via INTRAVENOUS
  Administered 2022-11-09: 5000 [IU] via INTRAVENOUS
  Administered 2022-11-09: 4000 [IU] via INTRAVENOUS
  Administered 2022-11-09: 3000 [IU] via INTRAVENOUS
  Administered 2022-11-09: 2000 [IU] via INTRAVENOUS

## 2022-11-09 MED ORDER — PROPOFOL 1000 MG/100ML IV EMUL
INTRAVENOUS | Status: AC
  Administered 2022-11-09: 75 ug/kg/min via INTRAVENOUS
  Filled 2022-11-09: qty 100

## 2022-11-09 MED ORDER — ALBUTEROL SULFATE (2.5 MG/3ML) 0.083% IN NEBU: 2.5000 mg | INHALATION_SOLUTION | RESPIRATORY_TRACT | Status: DC | PRN

## 2022-11-09 MED ORDER — ORAL CARE MOUTH RINSE
15.0000 mL | OROMUCOSAL | Status: DC
  Administered 2022-11-10 (×10): 15 mL via OROMUCOSAL

## 2022-11-09 MED ORDER — NOREPINEPHRINE 4 MG/250ML-% IV SOLN
INTRAVENOUS | Status: DC | PRN
  Administered 2022-11-09: 2 ug/kg/min via INTRAVENOUS

## 2022-11-09 MED ORDER — POLYETHYLENE GLYCOL 3350 17 G PO PACK: 17.0000 g | PACK | Freq: Every day | ORAL | Status: DC | PRN

## 2022-11-09 MED ORDER — CHLORHEXIDINE GLUCONATE CLOTH 2 % EX PADS
6.0000 | MEDICATED_PAD | Freq: Once | CUTANEOUS | Status: AC
  Administered 2022-11-09: 6 via TOPICAL

## 2022-11-09 MED ORDER — FENTANYL BOLUS VIA INFUSION: 50.0000 ug | INTRAVENOUS | Status: DC | PRN

## 2022-11-09 MED ORDER — FENTANYL CITRATE (PF) 250 MCG/5ML IJ SOLN: INTRAMUSCULAR | Status: DC | PRN

## 2022-11-09 MED ORDER — INSULIN ASPART 100 UNIT/ML IJ SOLN
2.0000 [IU] | INTRAMUSCULAR | Status: DC
  Administered 2022-11-09: 6 [IU] via SUBCUTANEOUS
  Administered 2022-11-10 (×3): 4 [IU] via SUBCUTANEOUS
  Administered 2022-11-10: 2 [IU] via SUBCUTANEOUS
  Administered 2022-11-10: 4 [IU] via SUBCUTANEOUS
  Administered 2022-11-10: 6 [IU] via SUBCUTANEOUS
  Administered 2022-11-11: 2 [IU] via SUBCUTANEOUS
  Administered 2022-11-11: 4 [IU] via SUBCUTANEOUS
  Administered 2022-11-11: 2 [IU] via SUBCUTANEOUS

## 2022-11-09 MED ORDER — LIDOCAINE 2% (20 MG/ML) 5 ML SYRINGE
INTRAMUSCULAR | Status: DC | PRN
  Administered 2022-11-09: 100 mg via INTRAVENOUS

## 2022-11-09 MED ORDER — PHENYLEPHRINE 80 MCG/ML (10ML) SYRINGE FOR IV PUSH (FOR BLOOD PRESSURE SUPPORT)
PREFILLED_SYRINGE | INTRAVENOUS | Status: DC | PRN
  Administered 2022-11-09 (×3): 80 ug via INTRAVENOUS

## 2022-11-09 MED ORDER — MELATONIN 5 MG PO TABS
10.0000 mg | ORAL_TABLET | Freq: Every evening | ORAL | Status: DC | PRN
  Administered 2022-11-11 – 2022-11-17 (×5): 10 mg
  Filled 2022-11-09 (×5): qty 2

## 2022-11-09 MED ORDER — LACTATED RINGERS IV SOLN: INTRAVENOUS | Status: DC

## 2022-11-09 MED ORDER — NOREPINEPHRINE 16 MG/250ML-% IV SOLN
0.0000 ug/min | INTRAVENOUS | Status: DC
  Administered 2022-11-09: 3 ug/min via INTRAVENOUS
  Administered 2022-11-10: 39 ug/min via INTRAVENOUS
  Administered 2022-11-10: 20 ug/min via INTRAVENOUS
  Administered 2022-11-10: 30 ug/min via INTRAVENOUS
  Administered 2022-11-11: 18 ug/min via INTRAVENOUS
  Filled 2022-11-09 (×5): qty 250

## 2022-11-09 MED ORDER — MIDAZOLAM HCL 2 MG/2ML IJ SOLN
1.0000 mg | INTRAMUSCULAR | Status: DC | PRN
  Administered 2022-11-10 (×3): 2 mg via INTRAVENOUS
  Filled 2022-11-09 (×3): qty 2

## 2022-11-09 MED ORDER — HEPARIN SODIUM (PORCINE) 1000 UNIT/ML IJ SOLN
INTRAMUSCULAR | Status: DC | PRN
  Administered 2022-11-09: 2000 [IU] via INTRAVENOUS

## 2022-11-09 MED ORDER — SODIUM CHLORIDE 0.9% FLUSH: 3.0000 mL | INTRAVENOUS | Status: DC | PRN

## 2022-11-09 MED ORDER — INSULIN ASPART 100 UNIT/ML IJ SOLN: 0.0000 [IU] | INTRAMUSCULAR | Status: DC | PRN

## 2022-11-09 MED ORDER — VANCOMYCIN HCL 1500 MG/300ML IV SOLN
1500.0000 mg | INTRAVENOUS | Status: AC
  Administered 2022-11-09: 1500 mg via INTRAVENOUS
  Filled 2022-11-09 (×2): qty 300

## 2022-11-09 MED ORDER — HYDROCOD POLI-CHLORPHE POLI ER 10-8 MG/5ML PO SUER: 5.0000 mL | Freq: Two times a day (BID) | ORAL | Status: DC

## 2022-11-09 MED ORDER — EPHEDRINE SULFATE-NACL 50-0.9 MG/10ML-% IV SOSY
PREFILLED_SYRINGE | INTRAVENOUS | Status: DC | PRN
  Administered 2022-11-09: 5 mg via INTRAVENOUS

## 2022-11-09 MED ORDER — PHENYLEPHRINE HCL-NACL 20-0.9 MG/250ML-% IV SOLN: INTRAVENOUS | Status: DC | PRN

## 2022-11-09 SURGICAL SUPPLY — 16 items
CATH MITRA STEERABLE GUIDE (CATHETERS) IMPLANT
CLIP MITRA G4 DELIVERY SYS NTW (Clip) IMPLANT
CLIP MITRA G4 DELIVERY SYS XTW (Clip) IMPLANT
CLOSURE PERCLOSE PROSTYLE (VASCULAR PRODUCTS) IMPLANT
KIT DILATOR VASC 18G NDL (KITS) IMPLANT
KIT HEART LEFT (KITS) ×2 IMPLANT
KIT VERSACROSS LRG ACCESS (CATHETERS) IMPLANT
PACK CARDIAC CATHETERIZATION (CUSTOM PROCEDURE TRAY) ×1 IMPLANT
SHEATH PINNACLE 8F 10CM (SHEATH) IMPLANT
SHEATH PROBE COVER 6X72 (BAG) ×1 IMPLANT
STOPCOCK MORSE 400PSI 3WAY (MISCELLANEOUS) ×6 IMPLANT
SYSTEM MITRACLIP G4 (SYSTAGENIX WOUND MANAGEMENT) IMPLANT
TRANSDUCER W/STOPCOCK (MISCELLANEOUS) ×1 IMPLANT
TUBING ART PRESS 72  MALE/FEM (TUBING) ×1
TUBING ART PRESS 72 MALE/FEM (TUBING) ×1 IMPLANT
WIRE MICRO SET SILHO 5FR 7 (SHEATH) IMPLANT

## 2022-11-09 NOTE — Progress Notes (Signed)
Informed the cath lab that cardiac device orders has been sent to Dr. Graciela Husbands, but [employer] needs to be notified about the surgery.

## 2022-11-09 NOTE — Anesthesia Procedure Notes (Signed)
Arterial Line Insertion Start/End12/26/2023 12:00 PM, 11/09/2022 12:04 PM Performed by: Randon Goldsmith, CRNA, CRNA  Patient location: Pre-op. Preanesthetic checklist: patient identified, IV checked, site marked, risks and benefits discussed, surgical consent, monitors and equipment checked, pre-op evaluation, timeout performed and anesthesia consent Lidocaine 1% used for infiltration Left, radial was placed Catheter size: 20 G Hand hygiene performed , maximum sterile barriers used  and Seldinger technique used Allen's test indicative of satisfactory collateral circulation Attempts: 1 Procedure performed without using ultrasound guided technique. Following insertion, dressing applied. Post procedure assessment: normal and unchanged  Patient tolerated the procedure well with no immediate complications.

## 2022-11-09 NOTE — Consult Note (Signed)
NAME:  Douglas [name], MRN:  [id number], DOB:  1976-09-24, LOS: 15 ADMISSION DATE:  10/25/2022, CONSULTATION DATE:  12/26 REFERRING MD:  Cardiology - Bensimhon, CHIEF COMPLAINT:  respiratory failure   History of Present Illness:  Douglas [name] is a 46 y/o gentleman with a history of cocaine abuse, former tobacco abuse, NICM, HFrEF who presented with acute decompensated heart failure on 12/11 after being discharged from a recent admission for hypervolemia. He required inotropic support for diuresis and was tolerating GDMT vasodilators and spironolactone, farxiga. He has had episodes of NSVT this admission. He has severe MR and underwent mitral clip x 3 with decompensation and increasing pulmonary edema.  Due to increasing oxygen requirements during the case he remained intubated and was transferred to the ICU.  PCCM was consulted for critical care management.  Pertinent  Medical History  Cocaine abuse HFrEF, NICM HTN DM NSVT Tobacco abuse CKD 3b  Significant Hospital Events: Including procedures, antibiotic start and stop dates in addition to other pertinent events   12/11 admitted 12/14 TEE showing EF 20% with global hypokinesis, severely dilated LA, severe functional MR, moderate TR 12/26 mitral clip  Interim History / Subjective:    Objective   Blood pressure (!) 122/97, pulse (!) 105, temperature 98.3 F (36.8 C), temperature source Oral, resp. rate 18, height 6\' 4"  (1.93 m), weight 122.5 kg, SpO2 94 %. CVP:  [8 mmHg-16 mmHg] 16 mmHg  Vent Mode: PRVC FiO2 (%):  [100 %] 100 % Set Rate:  [18 bmp] 18 bmp Vt Set:  [690 mL] 690 mL PEEP:  [10 cmH20] 10 cmH20 Plateau Pressure:  [27 cmH20] 27 cmH20   Intake/Output Summary (Last 24 hours) at 11/09/2022 1824 Last data filed at 11/09/2022 1728 Gross per 24 hour  Intake 1947.07 ml  Output 580 ml  Net 1367.07 ml   Filed Weights   11/06/22 0426 11/07/22 0300 11/08/22 0410  Weight: 122.5 kg 123 kg 122.5 kg     Examination: General: critically ill appearing man lying in bed in NAD HENT: [place]/AT, eyes anicteric Lungs: Rhales bilaterally, no wheezing. Synchronous with MV. Cardiovascular: S1S2, RRR Abdomen: obese, soft, NT Extremities: mild edema, no cyanosis Neuro: RASS -5, pupils reactive, intact cough. GU: Foley draining clear yellow urine   7.27/43/93/20 BUN 32 Cr 1.68  Istat K+  5.8 (waiting on confirmatory BMP) BG >200   Resolved Hospital Problem list     Assessment & Plan:  Acute on chronic HfrEF Severe MR, s/p mitral clip  NSVT -amiodarone -Additional diuresis overnight -con't milrinone; holding Bblocker due to inotrope dependent heart failure -Titrate norepinephrine to maintain MAP greater than 65 -holding Entreso due to renal insufficiency -aspirin -Continue Farxiga -Hold hydralazine and Imdur if blood pressures are soft on sedation. -Management per advanced heart failure.  HLD -statin  Acute respiratory failure with hypoxia Acute pulmonary edema -LTVV -VAP prevention protocol -PAD protocol for sedation- fentanyl, propofol -daily SAT & SBT as appropriate; needs to diurese first -follow up repeat ABG -Bronchodilators empirically with history of tobacco abuse  Hyperkalemia (on istat) -BMP pending  Qtc prolongation -d/c QT prolonging meds (pepcid)  Mediastinal adenopathy GGO on CT -needs repeat imaging after 6 months  DM with uncontrolled hyperglycemia; unclear why he received dexamethasone today -farxiga -Insulin glargine 15 units daily -SSI PRN -goal BG 140-180   Best Practice (right click and "Reselect all SmartList Selections" daily)   Diet/type: NPO DVT prophylaxis: LMWH GI prophylaxis: PPI Lines: Central line and Arterial Line Foley:  Yes, and it  is still needed Code Status:  full code Last date of multidisciplinary goals of care discussion [  ]  Labs   CBC: Recent Labs  Lab 11/04/22 0330 11/05/22 0814 11/08/22 0430  WBC 7.6  --   8.2  HGB 14.4 14.6  15.0 13.1  HCT 42.2 43.0  44.0 37.5*  MCV 84.6  --  83.5  PLT 249  --  250    Basic Metabolic Panel: Recent Labs  Lab 11/05/22 0512 11/05/22 0814 11/06/22 0315 11/07/22 0600 11/08/22 0430 11/09/22 0501  NA 136 138  137 135 135 134* 136  K 4.4 3.8  3.8 3.7 3.6 3.8 4.1  CL 102  --  102 102 101 104  CO2 24  --  24 24 24 24   GLUCOSE 116*  --  148* 188* 126* 123*  BUN 33*  --  38* 33* 35* 32*  CREATININE 1.86*  --  1.98* 1.75* 1.76* 1.68*  CALCIUM 9.0  --  8.5* 8.2* 8.7* 8.8*  MG 2.2  --   --   --  2.1 1.9   GFR: Estimated Creatinine Clearance: 76.9 mL/min (A) (by C-G formula based on SCr of 1.68 mg/dL (H)). Recent Labs  Lab 11/04/22 0330 11/08/22 0430  WBC 7.6 8.2    Liver Function Tests: No results for input(s): "AST", "ALT", "ALKPHOS", "BILITOT", "PROT", "ALBUMIN" in the last 168 hours. No results for input(s): "LIPASE", "AMYLASE" in the last 168 hours. No results for input(s): "AMMONIA" in the last 168 hours.  ABG    Component Value Date/Time   PHART 7.466 (H) 03/03/2022 1734   PCO2ART 36.7 03/03/2022 1734   PO2ART 60 (L) 03/03/2022 1734   HCO3 26.8 11/05/2022 0814   HCO3 25.9 11/05/2022 0814   TCO2 28 11/05/2022 0814   TCO2 27 11/05/2022 0814   ACIDBASEDEF 1.3 10/14/2013 0440   O2SAT 61.5 11/09/2022 0501     Coagulation Profile: No results for input(s): "INR", "PROTIME" in the last 168 hours.  Cardiac Enzymes: No results for input(s): "CKTOTAL", "CKMB", "CKMBINDEX", "TROPONINI" in the last 168 hours.  HbA1C: HbA1c, POC (controlled diabetic range)  Date/Time Value Ref Range Status  10/06/2022 03:33 PM 6.3 0.0 - 7.0 % Final  06/02/2021 10:49 AM 8.3 (A) 0.0 - 7.0 % Final   HbA1c POC (<> result, manual entry)  Date/Time Value Ref Range Status  07/02/2022 04:00 PM 7.3 4.0 - 5.6 % Final   Hgb A1c MFr Bld  Date/Time Value Ref Range Status  11/02/2022 12:06 PM 6.4 (H) 4.8 - 5.6 % Final    Comment:    (NOTE)          Prediabetes: 5.7 - 6.4         Diabetes: >6.4         Glycemic control for adults with diabetes: <7.0   02/28/2022 02:31 PM 7.4 (H) 4.8 - 5.6 % Final    Comment:    REPEATED TO VERIFY (NOTE) Pre diabetes:          5.7%-6.4%  Diabetes:              >6.4%  Glycemic control for   <7.0% adults with diabetes     CBG: Recent Labs  Lab 11/08/22 1141 11/08/22 1617 11/08/22 2117 11/09/22 0613 11/09/22 1154  GLUCAP 146* 117* 282* 140* 123*    Review of Systems:   Unable to be obtained due to mental status  Past Medical History:  He,  has a past medical history of Depression,  major, 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   Surgical History:   Past Surgical History:  Procedure Laterality Date   CARDIAC CATHETERIZATION  09/2011; 02/2013; 04/2013   CARDIAC DEFIBRILLATOR PLACEMENT  08/23/2013   IMPLANTABLE CARDIOVERTER DEFIBRILLATOR IMPLANT N/A 08/23/2013   Procedure: IMPLANTABLE CARDIOVERTER DEFIBRILLATOR IMPLANT;  Surgeon: Duke Salvia, MD;  Location: [facility] CATH LAB;  Service: Cardiovascular;  Laterality: N/A;   LEFT AND RIGHT HEART CATHETERIZATION WITH CORONARY ANGIOGRAM N/A 09/20/2011   Procedure: LEFT AND RIGHT HEART CATHETERIZATION WITH CORONARY ANGIOGRAM;  Surgeon: Dolores Patty, MD;  Location: [facility] CATH LAB;  Service: Cardiovascular;  Laterality: N/A;   MULTIPLE EXTRACTIONS WITH ALVEOLOPLASTY N/A 01/26/2013   Procedure:  EXTRACION  TOOTH # 19 WITH ALVEOLOPLASTY;  Surgeon: Charlynne Pander, DDS;  Location: [facility] OR;  Service: Oral Surgery;  Laterality: N/A;   RIGHT HEART CATH N/A 11/05/2022   Procedure: RIGHT HEART CATH;  Surgeon: Dolores Patty, MD;  Location: [facility] INVASIVE CV LAB;  Service:  Cardiovascular;  Laterality: N/A;   RIGHT HEART CATHETERIZATION N/A 02/22/2013   Procedure: RIGHT HEART CATH;  Surgeon: Dolores Patty, MD;  Location: [facility] CATH LAB;  Service: Cardiovascular;  Laterality: N/A;   RIGHT HEART CATHETERIZATION N/A 05/03/2013   Procedure: RIGHT HEART CATH;  Surgeon: Dolores Patty, MD;  Location: [facility] CATH LAB;  Service: Cardiovascular;  Laterality: N/A;   RIGHT/LEFT HEART CATH AND CORONARY ANGIOGRAPHY N/A 03/03/2022   Procedure: RIGHT/LEFT HEART CATH AND CORONARY ANGIOGRAPHY;  Surgeon: Dolores Patty, MD;  Location: [facility] INVASIVE CV LAB;  Service: Cardiovascular;  Laterality: N/A;   TEE WITHOUT CARDIOVERSION N/A 10/28/2022   Procedure: TRANSESOPHAGEAL ECHOCARDIOGRAM (TEE);  Surgeon: Dolores Patty, MD;  Location[facility] ENDOSCOPY;  Service: Cardiovascular;  Laterality: N/A;     Social History:   reports that he has been smoking cigarettes. He has a 2.00 pack-year smoking history. He has never used smokeless tobacco. He reports current alcohol use. He reports that he does not currently use drugs after having used the following drugs: Cocaine.   Family History:  His family history includes Coronary artery disease in his maternal uncle; Coronary artery disease (age of onset: 70) in his mother; Diabetes type II in his maternal uncle; Lung cancer in his father. There is no history of Stroke or Heart attack.   Allergies Allergies  Allergen Reactions   Bydureon [Exenatide] Rash   Penicillins Other (See Comments)    Childhood allergy Unknown reaction     Home Medications  Prior to Admission medications   Medication Sig Start Date End Date Taking? Authorizing Provider  acetaminophen (TYLENOL) 500 MG tablet Take 1 tablet (500 mg total) by mouth every 6 (six) hours as needed. Patient taking differently: Take 1,000 mg by mouth every 6 (six) hours as needed for mild pain, moderate pain, fever or headache. 04/29/21  Yes Claiborne Rigg, NP  atorvastatin (LIPITOR)  40 MG tablet Take 1 tablet (40 mg total) by mouth daily. Patient taking differently: Take 40 mg by mouth in the morning. 03/05/22 03/05/23 Yes Sharol Harness, Brittainy M, PA-C  carvedilol (COREG) 3.125 MG tablet Take 1 tablet (3.125 mg total) by mouth 2 (two) times daily with a meal. 09/22/22  Yes [place], Anderson Malta, FNP  dapagliflozin propanediol (FARXIGA) 10 MG TABS tablet Take 1 tablet (10 mg total) by mouth daily before  breakfast. Patient taking differently: Take 10 mg by mouth in the morning. 06/02/22 06/02/23 Yes Milford, Anderson Malta, FNP  insulin glargine (LANTUS SOLOSTAR) 100 UNIT/ML Solostar Pen Inject 36 Units into the skin once daily. Patient taking differently: Inject 36 Units into the skin in the morning. 08/09/22  Yes Claiborne Rigg, NP  metolazone (ZAROXOLYN) 2.5 MG tablet Take 1 pill every other Wednesday Patient taking differently: Take 2.5 mg by mouth as directed. 2.5 mg every other Monday as needed for fluid 06/02/22 12/17/22 Yes [place], [place], FNP  potassium chloride SA (KLOR-CON M) 20 MEQ tablet Take 1 tablet (20 mEq total) by mouth 2 (two) times daily. 06/21/22  Yes Laurey Morale, MD  torsemide (DEMADEX) 100 MG tablet Take 1 tablet (100 mg total) by mouth in the morning AND 0.5 tablets (50 mg total) every evening. Patient taking differently: 100 mg every morning, 50 mg every evening 04/28/22  Yes Milford, Anderson Malta, FNP  aspirin 81 MG EC tablet Take 1 tablet (81 mg total) by mouth daily. Patient not taking: Reported on 10/25/2022 03/05/22   Robbie Lis M, PA-C  Insulin Pen Needle (BD PEN NEEDLE NANO U/F) 32G X 4 MM MISC INJECT 1 APPLICATION INTO THE SKIN DAILY. 08/24/22   Hoy Register, MD     Critical care time: 46 min.     Steffanie Dunn, DO 11/09/22 8:54 PM [facility]  For contact information, see Amion. If no response to pager, please call PCCM consult pager. After hours, 7PM- 7AM, please call Elink.

## 2022-11-09 NOTE — Progress Notes (Signed)
eLink Physician-Brief Progress Note Patient Name: Douglas [name] DOB: 1976/02/24 MRN: [id number]   Date of Service  11/09/2022  HPI/Events of Note  Patient admitted with decompensated CHF, acute hypoxemic respiratory failure requiring intubation and mechanical ventilation, against a background of advanced severe  cardiomyopathy with systolic heart dysfunction.  eICU Interventions  New Patient Evaluation.        Thomasene Lot Yomayra Tate 11/09/2022, 9:28 PM

## 2022-11-09 NOTE — Progress Notes (Signed)
Received request today (12/26) for the following preop clearance:  {Planned Procedure:  Douglas [name] to [facility] Mitral Valve Repair  Surgeon:  Dr. Jorge Ny  Date of Procedure:  11/09/22  Cautery will be used.  Position during surgery:  Supine   Please send documentation back to:  [facility] (Fax # [phone])   Upon review of patient's Medtronic single lead ICD, there was noted dysfunction at 09/09/22 in clinic appointment and concern for lead dysfunction which could progress to potential fracture.  When reviewing clearance potential with Dr. Graciela Husbands,  [employer] industry rep present and reports that patient was already in surgery and orders received in house to turn off device for procedure.  Patient is not dependent.   At this time, nothing further to do on our end, appears clearance given in hospital (unsure by who).

## 2022-11-09 NOTE — Interval H&P Note (Signed)
History and Physical Interval Note:  11/09/2022 9:24 AM  Douglas [name]  has presented today for surgery, with the diagnosis of hf.  The various methods of treatment have been discussed with the patient and family. After consideration of risks, benefits and other options for treatment, the patient has consented to  Procedure(s): RIGHT HEART CATH (N/A) as a surgical intervention.  The patient's history has been reviewed, patient examined, no change in status, stable for surgery.  I have reviewed the patient's chart and labs.  Questions were answered to the patient's satisfaction.     [name]

## 2022-11-09 NOTE — Op Note (Signed)
[facility]   MULTIDISCIPLINARY HEART TEAM  Date of Procedure:  11/09/2022  Preoperative Diagnosis: Severe Symptomatic Mitral Regurgitation (Stage D)  Postoperative Diagnosis: Same   Procedure Performed: Ultrasound-guided right transfemoral venous access Double PreClose right femoral vein Transseptal puncture using Bailess RF needle Mitral valve repair with MitraClip XTW x 2  Surgeon: Blane Ohara, MD  Co-Surgeon: Lenna Sciara, MD  Echocardiographer: Jenkins Rouge, MD  Anesthesiologist: Nolon Nations, MD  Device Implant: Mitraclip XTW x 2  Procedural Indication: Severe Non-rheumatic Mitral Regurgitation (Stage D)   Brief History: 46 year old gentleman with advanced heart failure, NYHA functional class IV symptoms, milrinone dependent.  The patient has been evaluated for advanced heart failure therapies but was not felt to be a candidate because of social circumstances.  He underwent right heart catheterization and transesophageal echo assessment, demonstrating anatomic suitability for transcatheter edge-to-edge repair of the mitral valve.  After multidisciplinary review including team discussion with the advanced heart failure providers, we elected to proceed with MitraClip.  Echo Findings: Preop:  Severe global LV systolic dysfunction LVEF less than 25% Severe mitral regurgitation secondary to annular dilatation with leaflet mall coaptation, grade 4+ Post-op:  Unchanged LV systolic function Severe residual MR, with a large lateral jet of mitral regurgitation secondary to torn cords  Procedural Details: Prep The patient is brought to the cardiac catheterization lab in the fasting state. General anesthesia is induced. The patient is prepped from the groin to chin. Hemodynamics are monitored via a radial artery line.   Venous Access Using ultrasound guidance, the right femoral vein is punctured. Ultrasound images are captured and stored in the patient's  chart. The vein is dilated and 2 Perclose devices are deplyed at 10' and 2' positions to 'Preclose' the femoral vein. An 8 Fr sheath is inserted.  Transseptal Puncture A Baylis Versacross wire is advanced into the SVC A Baylis transseptal dilator is advanced into the SVC, and the VersaCross RF wire is retracted into the dilator  The transseptal sheath is retracted into the RA under fluoroscopic and echo guidance to obtain position on the posterior fossa where echo measurements are made to assure appropriate access to the mitral valve. Once proper position is confirmed by echo, RF energy is delivered and the VersaCross wire is advanced into the LA without resistance. The dilator and sheath are advanced over the wire where proper position is confirmed by echo and pressure measurement Weight based IV heparin is administered and a therapeutic ACT > 250 is confirmed  Steerable Guide Catheter Insertion The VersaCross wire is positioned at the left upper pulmonary vein The femoral vein is progressively dilated and the 24 Fr Steerable guide catheter is inserted and then directed across the interatrial septum over the wire. Position is confirmed approximately 3 cm into the left atrium The guide is de-aired   MitraClip Insertion The MitraClip XTW is prepped per protocol and inserted via the introducer into the steerable guide catheter The Clip Delivery System (CDS) is advanced under fluoro and echo guidance so that the sleeve markers are evenly spaced on each side of the guide marker  MitraClip Positioning in the Left Atrium (Supravalvular Alignment) M-knob is applied to bring the Clip towards the mitral valve. Echo guidance is used to avoid contact with LA structures. The Clip arms are opened to 180 degrees 2D and 3D TEE imaging is performed in multiple planes and the Clip is positioned and aligned above the valve using standard steering techniques   Entry into the Left  Ventricle and Mitral Valve  Leaflet Grasp The Clip is advanced across the mitral valve into the LV, maintaining proper orientation The Clip arms are opened to 120 degrees and the Clip is slowly retracted.  It was difficult to grasp the mitral leaflets secondary to the large area of mall coaptation.  After several attempts and repositioning in the left atrium, and adequate capture is obtained on the medial side of A2/P2. Capture of both the anterior and posterior leaflets are visualized by echo and the grippers are dropped  MitraClip Deployment After extensive echo evaluation, reduction in mitral regurgitation is felt to be adequate for the first clip Following standard protocol, the lock line is removed after testing the lock mechanism. The lock is rechecked and is shown to be intact. The MitraClip device is deployed and the clip delivery system is removed under echo guidance with caution taken to avoid contact with LA structures  Placement of Clip #2 MitraClip Insertion The MitraClip XTW is prepped per protocol and inserted via the introducer into the steerable guide catheter The Clip Delivery System (CDS) is advanced under fluoro and echo guidance so that the sleeve markers are evenly spaced on each side of the guide marker  MitraClip Positioning in the Left Atrium (Supravalvular Alignment) M-knob is applied to bring the Clip towards the mitral valve. Echo guidance is used to avoid contact with LA structures. Low tidal volume respiration is initiated The Clip arms are opened to 180 degrees 2D and 3D TEE imaging is performed in multiple planes and the Clip is positioned and aligned above the valve using standard steering techniques   Entry into the Left Ventricle and Mitral Valve Leaflet Grasp The Clip is advanced across the mitral valve into the LV, positioned on the medial side of the first clip.  Maintaining proper orientation and with caution taken to avoid contact with the first Clip The Clip arms are opened further  and the Clip is slowly retracted  Capture of both the anterior and posterior leaflets are visualized by echo and the grippers are dropped  MitraClip Deployment After extensive echo evaluation, reduction in mitral regurgitation is felt to be adequate Following standard protocol, the MitraClip device is deployed, gripper line and lock line are removed  Placement of clip #3 There was significant residual mitral regurgitation lateral to the indwelling clips.  While the V waves were reduced by about 50%, there was greater than 2+ residual mitral regurgitation.  We agreed a third clip was indicated.  A MitraClip NT W is chosen. The clip delivery system is advanced under fluoroscopic and echo guidance so that the sleeve markers are evenly spaced on each side of the guide marker after prepping the clip per protocol M knob is applied and the clip is brought down towards the mitral valve and positioned laterally where trajectory is checked.  Echo guidance is used to avoid contact with LA structures.  Low tidal volume respiration is continued.  The clip arms are opened to 180 degrees and aligned under 3D echo guidance. The clip is advanced across the mitral valve into the left ventricle with caution taken to avoid interaction with the other clips.  The clip interacted with the lateral valve apparatus and took a good bit of manipulation to reposition it and bring it back to the atrial side of the mitral valve.  The clip was everted and the grippers raised and dropped to try to free the clip up.  Even with these maneuvers and an abundance of  caution, there was some torn cords lateral to the clip with increased mitral regurgitation.  We recross the valve 3 more times with the MitraClip device but never could obtain good leaflet capture even with changing the trajectory of the clip and taking caution with the clip normal orientation.  After prolonged effort, we felt like further risk to the patient exceeded potential  benefit.  The clip is removed from the body and final imaging is obtained.  The left atrial V waves are measured and were equal to those at baseline.  Device Removal The clip delivery system is removed under echo guidance The steerable guide catheter is retracted into the right atrium and the interatrial septum is assessed by echo without evidence of right-to-left shunting or large ASD  Hemostasis The guide catheter is removed over a 0.035" wire and the Perclose sutures are tightened with complete hemostasis and no evidence of hematoma  Estimated blood loss: minimal  There are no immediate procedural complications. The patient is transferred to the post-procedure recovery area in stable condition.   Final conclusion: Unsuccessful transcatheter edge-to-edge repair of the mitral valve due to torn lateral cords with severe residual mitral regurgitation.  2 XTW clips were placed A2/P2, but the third clip (NTW) is removed from the body without being deployed.  Procedural results communicated with the patient's family in person.  Douglas [name] 11/09/2022 6:17 PM

## 2022-11-09 NOTE — Anesthesia Procedure Notes (Addendum)
Procedure Name: Intubation Date/Time: 11/09/2022 1:44 PM  Performed by: Dorthea Cove, CRNAPre-anesthesia Checklist: Patient identified, Emergency Drugs available, Suction available and Patient being monitored Patient Re-evaluated:Patient Re-evaluated prior to induction Oxygen Delivery Method: Circle system utilized Preoxygenation: Pre-oxygenation with 100% oxygen Induction Type: IV induction Ventilation: Mask ventilation without difficulty and Two handed mask ventilation required Laryngoscope Size: 4 and Mac Grade View: Grade I Tube type: Oral Number of attempts: 1 Airway Equipment and Method: Stylet and Oral airway Placement Confirmation: ETT inserted through vocal cords under direct vision, positive ETCO2 and breath sounds checked- equal and bilateral Secured at: 24 cm Tube secured with: Tape Dental Injury: Teeth and Oropharynx as per pre-operative assessment

## 2022-11-09 NOTE — Progress Notes (Signed)
Patient had 22 v-tachs. Patient was asymptomatic. Will continue to monitor.

## 2022-11-09 NOTE — Progress Notes (Addendum)
Advanced Heart Failure Rounding Note  PCP-Cardiologist: None   Subjective:    Remains on milrinone 0.25. CO-OX 61.5%   On amio 30 mg per hour. Had NSVT this am.   Seen by Structural Heart Team and plan mTEER today .  Had a hard time sleeping last night.   Objective:   Weight Range: 122.5 kg Body mass index is 32.87 kg/m.   Vital Signs:   Temp:  [97.7 F (36.5 C)-98.8 F (37.1 C)] 98.8 F (37.1 C) (12/26 0553) Pulse Rate:  [90-92] 91 (12/26 0553) Resp:  [18-20] 18 (12/26 0553) BP: (102-126)/(71-80) 126/77 (12/26 0553) SpO2:  [92 %-97 %] 97 % (12/26 0553) Last BM Date : 11/07/22  Weight change: Filed Weights   11/06/22 0426 11/07/22 0300 11/08/22 0410  Weight: 122.5 kg 123 kg 122.5 kg    Intake/Output:   Intake/Output Summary (Last 24 hours) at 11/09/2022 0709 Last data filed at 11/09/2022 0552 Gross per 24 hour  Intake 2137.07 ml  Output 2780 ml  Net -642.93 ml     Physical Exam  General:   No resp difficulty HEENT: normal Neck: supple. JVP 9-10 . Carotids 2+ bilat; no bruits. No lymphadenopathy or thryomegaly appreciated. Cor: PMI nondisplaced. Regular rate & rhythm. No rubs, gallops. 3/6 MR . Lungs: clear Abdomen: soft, nontender, nondistended. No hepatosplenomegaly. No bruits or masses. Good bowel sounds. Extremities: no cyanosis, clubbing, rash, edema. RUE PICC double lumen  Neuro: alert & orientedx3, cranial nerves grossly intact. moves all 4 extremities w/o difficulty. Affect pleasant    Telemetry    SR 80-90s with NSVT 22 beats  earlier this morning.    Labs    CBC Recent Labs    11/08/22 0430  WBC 8.2  HGB 13.1  HCT 37.5*  MCV 83.5  PLT 426   Basic Metabolic Panel Recent Labs    11/08/22 0430 11/09/22 0501  NA 134* 136  K 3.8 4.1  CL 101 104  CO2 24 24  GLUCOSE 126* 123*  BUN 35* 32*  CREATININE 1.76* 1.68*  CALCIUM 8.7* 8.8*  MG 2.1  --    Liver Function Tests No results for input(s): "AST", "ALT", "ALKPHOS",  "BILITOT", "PROT", "ALBUMIN" in the last 72 hours.  No results for input(s): "LIPASE", "AMYLASE" in the last 72 hours. Cardiac Enzymes No results for input(s): "CKTOTAL", "CKMB", "CKMBINDEX", "TROPONINI" in the last 72 hours.  BNP: BNP (last 3 results) Recent Labs    10/18/22 1821 10/25/22 1442 10/28/22 1825  BNP 1,494.7* 1,428.2* 491.6*    ProBNP (last 3 results) No results for input(s): "PROBNP" in the last 8760 hours.   D-Dimer No results for input(s): "DDIMER" in the last 72 hours. Hemoglobin A1C No results for input(s): "HGBA1C" in the last 72 hours.  Fasting Lipid Panel No results for input(s): "CHOL", "HDL", "LDLCALC", "TRIG", "CHOLHDL", "LDLDIRECT" in the last 72 hours. Thyroid Function Tests No results for input(s): "TSH", "T4TOTAL", "T3FREE", "THYROIDAB" in the last 72 hours.  Invalid input(s): "FREET3"   Other results:   Imaging    No results found.   Medications:     Scheduled Medications:  albuterol  2.5 mg Nebulization Once   aspirin  81 mg Oral Daily   atorvastatin  80 mg Oral Daily   benzonatate  100 mg Oral TID   [START ON 11/10/2022] chlorhexidine  15 mL Mouth/Throat Once   Chlorhexidine Gluconate Cloth  6 each Topical Daily   dapagliflozin propanediol  10 mg Oral QAC breakfast  dextromethorphan  30 mg Oral BID   enoxaparin (LOVENOX) injection  60 mg Subcutaneous Q24H   guaiFENesin  600 mg Oral BID   hydrALAZINE  25 mg Oral Q8H   insulin aspart  0-15 Units Subcutaneous TID WC   insulin glargine-yfgn  15 Units Subcutaneous Daily   isosorbide mononitrate  30 mg Oral Daily   potassium chloride  40 mEq Oral Daily   sodium chloride flush  10-40 mL Intracatheter Q12H   sodium chloride flush  3 mL Intravenous Q12H   sodium chloride flush  3 mL Intravenous Q12H   spironolactone  12.5 mg Oral Daily   torsemide  80 mg Oral Daily    Infusions:  sodium chloride     sodium chloride 20 mL/hr at 11/09/22 5885   amiodarone 30 mg/hr (11/09/22  0552)   milrinone 0.25 mcg/kg/min (11/09/22 0552)   vancomycin      PRN Medications: sodium chloride, acetaminophen, ALPRAZolam, melatonin, polyethylene glycol, prochlorperazine, sodium chloride flush, sodium chloride flush    Patient Profile   Douglas [name] is a 46 year old with a history of HTN, hyperlipidemia, smoker, cocaine abuse,NSVT,  NICM, Medtronic ICD, and chronic HFrEF. Last used cocaine about a week ago.  EF has been down for some time.    Admitted recently with A/C HFrEF. Suspect he had residual congestion when discharged.  Readmitted 12/11 with A/C HFrEF.  Assessment/Plan   1. A/C HFrEF  - NICM. Has Medtronic ICD. Had [place] earlier this year with nonobstructive CAD.   - Admit early December. Suspect still had volume on board at discharge + increased fluid intake leading to readmission several days later. - NYHA IV on admit - TEE this admit EF 20%, severe central MR -- RHC on 11/05/22 RA 2 PCW 33 (v = 48) CI ~2.0  - Volume status ok. Continue torsemide 80 mg daily  -CO-OX 61.5 % on milrinone 0.25 mcg. Continue for now.  -Continue spiro 12.5 mg daily - Off carvedilol with low output - Continue farxiga - Continue hydral and imdur  - Continue to hold Entresto for now with renal impairment - Renal function ok.  - Has end-stage HF and appears that he is now inotrope dependent. This may be the last opportunity to consider advanced therapies including VAD. However, this decision is complicated by ongoing drug use and difficult social situation. Has been turned down for VAD due primarily due to limited social support. Plan mTEER today    2. AKI on CKD Stage IIIb - Creatinine on admit 2. Baseline previously ~ 1.6.  - Cr 1.7 today  - Diuretics as above. - Continue inotrope support   3. Substance Abuse - Last used cocaine about a week PTA. - Discussed cessation.  - reports he is motivated to quit so he can qualify for VAD  4. CAD - LHC 02/2022 nonobstructive CAD - LDL 124   - Continue atorvastatin    5. DMII -On SSI -Continue Farxiga   6. HTN  - Stable    7. NSVT - Previously on amio but he stopped in April of this year.  I am not sure why he stopped.  - Frequent burst of NSVT noted on device since the beginning of December.  - Now on IV amio d/t frequent runs NSVT.  - Continue IV amio while on inotrope support - Earlier had 22 beats NSVT. K 4.1 Check mag.  - Keep K > 4.0 Mg > 2.0  - Off Coreg with low-output   8.  Obesity  - Body mass index is 32.87 kg/m.   9. MR  - TEE 12/14: EF 20%, Severe central MR - As above -> mTEER 12/26 -NPO  10. Mediastinal lymph nodes, GGO RUL - Noted on preVAD CT - Will need repeat imaging in 3-6 months to follow  11. Hypokalemia -Stable    SDOH: -Has medicaid -Works part-time at [employer].  -SW and VAD team following to see if he would be VAD candidate. HF SW met with his sister.  -Currently living with Aunt and sleeps on the floor. Would need more stable living situation prior to VAD. -Has been turned down for VAD due primarily due to limited social support.  -   Length of Stay: [place], NP  7:09 AM  Advanced Heart Failure Team Pager [phone] (M-F; 7a - 5p)  Please contact [facility] for night-coverage after hours (5p -7a ) and weekends on [email or web address]  Patient seen and examined with the above-signed Advanced Practice Provider and/or Housestaff. I personally reviewed laboratory data, imaging studies and relevant notes. I independently examined the patient and formulated the important aspects of the plan. I have edited the note to reflect any of my changes or salient points. I have personally discussed the plan with the patient and/or family.  Stable on milrinone. Diuresing fairly well. Renal function stable.   General:  Lying in bed  No resp difficulty HEENT: normal Neck: supple.JVP 10 Carotids 2+ bilat; no bruits. No lymphadenopathy or thryomegaly appreciated. Cor: PMI nondisplaced.  Regular rate & rhythm. 2/6 MR Lungs: clear Abdomen: soft, nontender, nondistended. No hepatosplenomegaly. No bruits or masses. Good bowel sounds. Extremities: no cyanosis, clubbing, rash, edema Neuro: alert & orientedx3, cranial nerves grossly intact. moves all 4 extremities w/o difficulty. Affect pleasant  Stable but tenuous on milrinone. For mTEER today. D/w Structural Team. Appreciate their care.  Glori Bickers, MD  11:38 AM

## 2022-11-09 NOTE — Progress Notes (Signed)
eLink Physician-Brief Progress Note Patient Name: Douglas [name] DOB: 09-14-1976 MRN: [id number]   Date of Service  11/09/2022  HPI/Events of Note  ABG reviewed, PO2 354 on FiO2 of 100 %.  eICU Interventions  Respiratory instruction sent to RT to wean FiO2.     Intervention Category Intermediate Interventions: Other:  Migdalia Dk 11/09/2022, 10:44 PM

## 2022-11-09 NOTE — Progress Notes (Signed)
[facility] Progress Note Patient: Douglas [name] RCB:[id number] DOB: Apr 05, 1976 DOA: 10/25/2022  DOS: the patient was seen and examined on 11/09/2022  Brief hospital course: [name] is a 46 y.o. M with sCHF EF 20% s/p ICD, polysubstance abuse, HTN, obesity, and CAD who presented with progressive shortness of breath.   12/11 Admitted for acute on chronic systolic CHF on IV diuretics 12/12 Advanced HF team consulted 12/13 Milrinone started 12/20 CT surgery and LVAD SW consulted  12/26 underwent unsuccessful mitral valve repair required pressors and intubation.  Currently transferred to ICU.  TRH will sign off. Assessment and Plan: Acute on chronic HFrEF. Nonischemic. S/P Medtronic ICD implant. Severe central MR. Advanced heart failure team currently following. Currently on Primacor.  Per cardiology now inotrope dependent. Underwent TEE.  EF 20%.  Has severe central MR S/P RHC on 12/22 Currently on Farxiga, hydralazine, Imdur aldactone.  Continue torsemide. Has not been a great candidate for VAD due to his drug use (cocaine) and difficult social situation. Underwent mTEER on 12/26.   AKI on CKD 3B. Baseline creatinine around 1.6.  On admission creatinine 2.  Currently stable.  Monitor.   Frequent NSVT. On amiodarone infusion.  Continuing IV while on inotrope support. Maintain K more than 4.  Mag more than 2. Beta-blocker currently on hold due to low output.  Acute cough. Etiology not clear.  Chest x-ray 12/24 shows CHF.  No consolidation. Continue aggressive cough regimen including Tussionex.   CAD. Nonobstructive. Left heart cath April 2023.   HLD. Continue statin.   Substance abuse. Cocaine use 1 week prior to admission.  UDS negative in the hospital.  (Performed after 7 days of hospital stay) Monitor.   Obesity.Body mass index is 32.87 kg/m.  Placing the patient at high risk for poor outcome.   Bilateral groundglass opacities.  Mediastinal  lymphadenopathy More on the right side. Concerning for infection/inflammation.  Seen on the CT scan on 12/19. Follow-up CT in 3 months recommended.   Anxiety and depression. On Xanax.  Continue.   Subjective: Seen before the procedure.  No nausea or vomiting no fever no chills.  No chest pain.  Continues to have cough.  Physical Exam: General: in MILD distress;  Cardiovascular: S1 and S2 Present, NO Murmur Respiratory: normal respiratory effort, Bilateral Air entry present, no Crackles, no wheezes Abdomen: Bowel Sound present, Non tender  Extremities: trace edema Neurology: alert and oriented to time, place, and person   Data Reviewed: I have Reviewed nursing notes, Vitals, and Lab results. Since last encounter, pertinent lab results BMP.  Disposition: Status is: Inpatient Remains inpatient appropriate because: Need ICU level care now.   Family Communication: Family was at bedside at the time of my evaluation before procedure. Level of care: Progressive Cardiac  Vitals:   11/09/22 1154 11/09/22 1333 11/09/22 1425 11/09/22 1816  BP: (!) 122/97     Pulse: 84 (!) 0 (!) 116 (!) 105  Resp: 20   18  Temp: 98.3 F (36.8 C)     TempSrc: Oral     SpO2: 93%   94%  Weight:      Height:         Author: Lynden Oxford, MD 11/09/2022 6:26 PM  Please look on [email or web address] to find out who is on call.

## 2022-11-09 NOTE — Progress Notes (Signed)
eLink Physician-Brief Progress Note Patient Name: Douglas [name] DOB: 10-May-1976 MRN: [id number]   Date of Service  11/09/2022  HPI/Events of Note  Patient has Propofol and Norepinephrine infusing and needs orders for both. He also needs orders for CBG + SSI.  eICU Interventions  Orders entered.        [name] 11/09/2022, 8:30 PM

## 2022-11-09 NOTE — Transfer of Care (Signed)
Immediate Anesthesia Transfer of Care Note  Patient: BRANDONN CAPELLI  Procedure(s) Performed: MITRAL VALVE REPAIR TRANSESOPHAGEAL ECHOCARDIOGRAM (TEE)  Patient Location: ICU  Anesthesia Type:General  Level of Consciousness: Patient remains intubated per anesthesia plan  Airway & Oxygen Therapy: Patient remains intubated per anesthesia plan and Patient placed on Ventilator (see vital sign flow sheet for setting)  Post-op Assessment: Report given to RN and Post -op Vital signs reviewed and stable  Post vital signs: Reviewed and stable  Last Vitals: SEE ICU VITALS Vitals Value Taken Time  BP    Temp    Pulse 102 11/09/22 1821  Resp 23 11/09/22 1821  SpO2 95 % 11/09/22 1821  Vitals shown include unvalidated device data.  Last Pain:  Vitals:   11/09/22 1154  TempSrc: Oral  PainSc: 0-No pain      Patients Stated Pain Goal: 0 (11/04/22 0835)  Complications: There were no known notable events for this encounter.

## 2022-11-10 ENCOUNTER — Encounter (HOSPITAL_COMMUNITY): Payer: Self-pay | Admitting: Cardiovascular Disease

## 2022-11-10 ENCOUNTER — Inpatient Hospital Stay (HOSPITAL_COMMUNITY): Payer: Medicaid Other

## 2022-11-10 ENCOUNTER — Telehealth (HOSPITAL_COMMUNITY): Payer: Self-pay | Admitting: Licensed Clinical Social Worker

## 2022-11-10 DIAGNOSIS — Z952 Presence of prosthetic heart valve: Secondary | ICD-10-CM | POA: Diagnosis not present

## 2022-11-10 DIAGNOSIS — J810 Acute pulmonary edema: Secondary | ICD-10-CM | POA: Diagnosis not present

## 2022-11-10 DIAGNOSIS — N182 Chronic kidney disease, stage 2 (mild): Secondary | ICD-10-CM | POA: Diagnosis not present

## 2022-11-10 DIAGNOSIS — I340 Nonrheumatic mitral (valve) insufficiency: Secondary | ICD-10-CM | POA: Diagnosis not present

## 2022-11-10 DIAGNOSIS — N179 Acute kidney failure, unspecified: Secondary | ICD-10-CM | POA: Diagnosis not present

## 2022-11-10 DIAGNOSIS — Z515 Encounter for palliative care: Secondary | ICD-10-CM | POA: Diagnosis not present

## 2022-11-10 DIAGNOSIS — I501 Left ventricular failure: Secondary | ICD-10-CM | POA: Insufficient documentation

## 2022-11-10 DIAGNOSIS — Z66 Do not resuscitate: Secondary | ICD-10-CM | POA: Diagnosis not present

## 2022-11-10 DIAGNOSIS — I5023 Acute on chronic systolic (congestive) heart failure: Secondary | ICD-10-CM | POA: Diagnosis not present

## 2022-11-10 DIAGNOSIS — I5043 Acute on chronic combined systolic (congestive) and diastolic (congestive) heart failure: Secondary | ICD-10-CM | POA: Diagnosis not present

## 2022-11-10 DIAGNOSIS — I130 Hypertensive heart and chronic kidney disease with heart failure and stage 1 through stage 4 chronic kidney disease, or unspecified chronic kidney disease: Secondary | ICD-10-CM | POA: Diagnosis not present

## 2022-11-10 DIAGNOSIS — Z006 Encounter for examination for normal comparison and control in clinical research program: Secondary | ICD-10-CM | POA: Diagnosis not present

## 2022-11-10 LAB — CBC
HCT: 40.9 % (ref 39.0–52.0)
Hemoglobin: 14.2 g/dL (ref 13.0–17.0)
MCH: 29.3 pg (ref 26.0–34.0)
MCHC: 34.7 g/dL (ref 30.0–36.0)
MCV: 84.5 fL (ref 80.0–100.0)
Platelets: 329 10*3/uL (ref 150–400)
RBC: 4.84 MIL/uL (ref 4.22–5.81)
RDW: 13.2 % (ref 11.5–15.5)
WBC: 12.5 10*3/uL — ABNORMAL HIGH (ref 4.0–10.5)
nRBC: 0 % (ref 0.0–0.2)

## 2022-11-10 LAB — COOXEMETRY PANEL
Carboxyhemoglobin: 1.7 % — ABNORMAL HIGH (ref 0.5–1.5)
Carboxyhemoglobin: 1.6 % — ABNORMAL HIGH (ref 0.5–1.5)
Carboxyhemoglobin: 1.7 % — ABNORMAL HIGH (ref 0.5–1.5)
Methemoglobin: <0.7 % (ref 0.0–1.5)
Methemoglobin: 1 % (ref 0.0–1.5)
Methemoglobin: 0.9 % (ref 0.0–1.5)
O2 Saturation: 87.8 %
O2 Saturation: 86 %
O2 Saturation: 86.2 %
Total hemoglobin: 13.2 g/dL (ref 12.0–16.0)
Total hemoglobin: 11 g/dL — ABNORMAL LOW (ref 12.0–16.0)
Total hemoglobin: 12.6 g/dL (ref 12.0–16.0)

## 2022-11-10 LAB — ECHOCARDIOGRAM COMPLETE
Area-P 1/2: 3.72 cm2
Calc EF: 42.6 %
Height: 76 in
MV M vel: 4.49 m/s
MV Peak grad: 80.6 mmHg
MV VTI: 1.28 cm2
Radius: 0.7 cm
S' Lateral: 6.5 cm
Single Plane A2C EF: 39.3 %
Single Plane A4C EF: 39.3 %
Weight: 4430.36 oz

## 2022-11-10 LAB — BASIC METABOLIC PANEL
Anion gap: 10 (ref 5–15)
Anion gap: 10 (ref 5–15)
Anion gap: 11 (ref 5–15)
BUN: 39 mg/dL — ABNORMAL HIGH (ref 6–20)
BUN: 41 mg/dL — ABNORMAL HIGH (ref 6–20)
BUN: 45 mg/dL — ABNORMAL HIGH (ref 6–20)
CO2: 20 mmol/L — ABNORMAL LOW (ref 22–32)
CO2: 19 mmol/L — ABNORMAL LOW (ref 22–32)
CO2: 19 mmol/L — ABNORMAL LOW (ref 22–32)
Calcium: 9.1 mg/dL (ref 8.9–10.3)
Calcium: 8 mg/dL — ABNORMAL LOW (ref 8.9–10.3)
Calcium: 8.1 mg/dL — ABNORMAL LOW (ref 8.9–10.3)
Chloride: 104 mmol/L (ref 98–111)
Chloride: 106 mmol/L (ref 98–111)
Chloride: 106 mmol/L (ref 98–111)
Creatinine, Ser: 2.6 mg/dL — ABNORMAL HIGH (ref 0.61–1.24)
Creatinine, Ser: 2.74 mg/dL — ABNORMAL HIGH (ref 0.61–1.24)
Creatinine, Ser: 2.89 mg/dL — ABNORMAL HIGH (ref 0.61–1.24)
GFR, Estimated: 29 mL/min — ABNORMAL LOW (ref >60)
GFR, Estimated: 28 mL/min — ABNORMAL LOW (ref >60)
GFR, Estimated: 26 mL/min — ABNORMAL LOW (ref >60)
Glucose, Bld: 199 mg/dL — ABNORMAL HIGH (ref 70–99)
Glucose, Bld: 191 mg/dL — ABNORMAL HIGH (ref 70–99)
Glucose, Bld: 147 mg/dL — ABNORMAL HIGH (ref 70–99)
Potassium: 4.9 mmol/L (ref 3.5–5.1)
Potassium: 4.2 mmol/L (ref 3.5–5.1)
Potassium: 4.2 mmol/L (ref 3.5–5.1)
Sodium: 134 mmol/L — ABNORMAL LOW (ref 135–145)
Sodium: 135 mmol/L (ref 135–145)
Sodium: 136 mmol/L (ref 135–145)

## 2022-11-10 LAB — GLUCOSE, CAPILLARY
Glucose-Capillary: 137 mg/dL — ABNORMAL HIGH (ref 70–99)
Glucose-Capillary: 166 mg/dL — ABNORMAL HIGH (ref 70–99)
Glucose-Capillary: 168 mg/dL — ABNORMAL HIGH (ref 70–99)
Glucose-Capillary: 170 mg/dL — ABNORMAL HIGH (ref 70–99)
Glucose-Capillary: 174 mg/dL — ABNORMAL HIGH (ref 70–99)
Glucose-Capillary: 188 mg/dL — ABNORMAL HIGH (ref 70–99)
Glucose-Capillary: 222 mg/dL — ABNORMAL HIGH (ref 70–99)

## 2022-11-10 LAB — MAGNESIUM: Magnesium: 2.2 mg/dL (ref 1.7–2.4)

## 2022-11-10 LAB — PHOSPHORUS: Phosphorus: 6.5 mg/dL — ABNORMAL HIGH (ref 2.5–4.6)

## 2022-11-10 LAB — LIPOPROTEIN A (LPA): Lipoprotein (a): 40.7 nmol/L — ABNORMAL HIGH (ref <75.0)

## 2022-11-10 LAB — TRIGLYCERIDES: Triglycerides: 66 mg/dL (ref <150)

## 2022-11-10 MED ORDER — SODIUM CHLORIDE 0.9 % IV SOLN
0.0000 ug/min | INTRAVENOUS | Status: AC
  Administered 2022-11-10: 0.5 ug/min via INTRAVENOUS
  Filled 2022-11-10: qty 10

## 2022-11-10 MED ORDER — FENTANYL CITRATE PF 50 MCG/ML IJ SOSY: 50.0000 ug | PREFILLED_SYRINGE | INTRAMUSCULAR | Status: DC | PRN

## 2022-11-10 MED ORDER — INSULIN GLARGINE-YFGN 100 UNIT/ML ~~LOC~~ SOLN
17.0000 [IU] | Freq: Every day | SUBCUTANEOUS | Status: DC
  Administered 2022-11-10 – 2022-11-12 (×3): 17 [IU] via SUBCUTANEOUS
  Filled 2022-11-10 (×3): qty 0.17

## 2022-11-10 MED ORDER — DEXMEDETOMIDINE HCL IN NACL 400 MCG/100ML IV SOLN
0.0000 ug/kg/h | INTRAVENOUS | Status: DC
  Administered 2022-11-10: 0.4 ug/kg/h via INTRAVENOUS
  Filled 2022-11-10 (×2): qty 100

## 2022-11-10 MED ORDER — FENTANYL 2500MCG IN NS 250ML (10MCG/ML) PREMIX INFUSION
50.0000 ug/h | INTRAVENOUS | Status: DC
  Administered 2022-11-10 (×2): 300 ug/h via INTRAVENOUS
  Filled 2022-11-10: qty 250

## 2022-11-10 MED ORDER — DOCUSATE SODIUM 50 MG/5ML PO LIQD: 100.0000 mg | Freq: Two times a day (BID) | ORAL | Status: DC

## 2022-11-10 MED ORDER — POTASSIUM CHLORIDE 20 MEQ PO PACK
20.0000 meq | PACK | Freq: Once | ORAL | Status: AC
  Administered 2022-11-10: 20 meq
  Filled 2022-11-10: qty 1

## 2022-11-10 MED ORDER — FAMOTIDINE 20 MG PO TABS
20.0000 mg | ORAL_TABLET | Freq: Every day | ORAL | Status: DC
  Administered 2022-11-10 – 2022-11-11 (×2): 20 mg
  Filled 2022-11-10 (×2): qty 1

## 2022-11-10 MED ORDER — FUROSEMIDE 10 MG/ML IJ SOLN
80.0000 mg | Freq: Once | INTRAMUSCULAR | Status: AC
  Administered 2022-11-10: 80 mg via INTRAVENOUS
  Filled 2022-11-10: qty 8

## 2022-11-10 MED ORDER — ORAL CARE MOUTH RINSE: 15.0000 mL | OROMUCOSAL | Status: DC | PRN

## 2022-11-10 MED ORDER — POLYETHYLENE GLYCOL 3350 17 G PO PACK: 17.0000 g | PACK | Freq: Every day | ORAL | Status: DC

## 2022-11-10 MED ORDER — PROPOFOL 1000 MG/100ML IV EMUL
5.0000 ug/kg/min | INTRAVENOUS | Status: DC
  Administered 2022-11-10: 10 ug/kg/min via INTRAVENOUS
  Filled 2022-11-10: qty 100

## 2022-11-10 NOTE — Progress Notes (Signed)
Echocardiogram 2D Echocardiogram has been performed.  Douglas [name] 11/10/2022, 8:52 AM

## 2022-11-10 NOTE — Progress Notes (Addendum)
Brief MCS note:   VAD team signed back on to continue possible VAD implant discussion following failed Mitraclip.   Spoke with pt's brother Douglas [name] [phone]) regarding pt living situation and caregiver role. Pt is unable to live with him due to lack of space as pt is married with children in the home. Douglas [name] states he works full time (12 hour days, 5-6 days a week) and is unsure whether he will be able to assist pt. VAD coordinator will plan to meet with him Friday afternoon to discuss caregiver role and possible VAD implant. If Quinton willing to fulfill caregiver role he will meet with our social worker for further social eval.   Met with pt's sister Douglas [name] at bedside. Briefly discussed pt status and need to possibly pursue other options following failed Mitraclip. Reports that pt's daughter is looking into housing options at this time. Douglas [name] states she is willing to come for VAD training and possibly serve as caregiver as her work schedule allows, but pt is unable to live with her. Provided me with her aunt Douglas [name]'s phone number to discuss possible living situation options.  Spoke with pt's aunt Douglas [name] [phone]) regarding pt's living situation. She is unable to have pt live with her due to lack of space. Pt is currently living with Douglas [name]'s sister in her living room, sleeping on the floor. She believes that pt will be allowed to live with her for another few months while finding permanent housing. She plans to discuss this with her and update VAD coordinators. Provided Douglas [name] with VAD office #.   Alyce Pagan RN VAD Coordinator  Office: [phone]  24/7 Pager: [phone]

## 2022-11-10 NOTE — Anesthesia Postprocedure Evaluation (Signed)
Anesthesia Post Note  Patient: CLOVIS MANKINS  Procedure(s) Performed: MITRAL VALVE REPAIR TRANSESOPHAGEAL ECHOCARDIOGRAM (TEE)     Patient location during evaluation: ICU Anesthesia Type: General Level of consciousness: sedated and patient remains intubated per anesthesia plan Pain management: pain level controlled Vital Signs Assessment: post-procedure vital signs reviewed and stable Respiratory status: patient on ventilator - see flowsheet for VS and patient remains intubated per anesthesia plan Cardiovascular status: stable Anesthetic complications: no   There were no known notable events for this encounter.  Last Vitals:  Vitals:   11/10/22 0500 11/10/22 0600  BP:    Pulse: 72 72  Resp: 18 18  Temp:    SpO2: 99% 99%    Last Pain:  Vitals:   11/09/22 1154  TempSrc: Oral  PainSc: 0-No pain                 Lewie Loron

## 2022-11-10 NOTE — Progress Notes (Addendum)
Advanced Heart Failure Rounding Note  PCP-Cardiologist: None   Subjective:    12/26: Unsuccessful mTEER with severe residual MR d/t chordal disruption, 2 XTW clips being placed at A2/P2 position. Unable to place 3rd clip.  CO-OX 86% >> recheck  Currently on 0.25 milrinone + 37 NE + 0.5 Epi  CVP 10.   Remains intubated. FiO2 40%. Awake on vent and following commands despite sedation.   Urinary retention overnight. Has external urinary catheter.  Objective:   Weight Range: 125.6 kg Body mass index is 33.71 kg/m.   Vital Signs:   Temp:  [98 F (36.7 C)-98.3 F (36.8 C)] 98 F (36.7 C) (12/27 0400) Pulse Rate:  [0-116] 74 (12/27 0800) Resp:  [0-21] 18 (12/27 0800) BP: (94-122)/(56-97) 94/56 (12/27 0800) SpO2:  [93 %-99 %] 97 % (12/27 0800) Arterial Line BP: (84-134)/(50-77) 94/56 (12/27 0800) FiO2 (%):  [40 %-100 %] 40 % (12/27 0804) Weight:  [125.6 kg] 125.6 kg (12/27 0500) Last BM Date : 11/07/22  Weight change: Filed Weights   11/07/22 0300 11/08/22 0410 11/10/22 0500  Weight: 123 kg 122.5 kg 125.6 kg    Intake/Output:   Intake/Output Summary (Last 24 hours) at 11/10/2022 0946 Last data filed at 11/10/2022 0600 Gross per 24 hour  Intake 2475.13 ml  Output 575 ml  Net 1900.13 ml      Physical Exam  General:  Awake on vent. HEENT: + ETT Neck: supple. JVP ~ 10 cm.  Cor: PMI nondisplaced. Regular rate & rhythm. No rubs, gallops, 2/6 MR murmur Lungs: mechanical breath sounds Abdomen: obese, soft, nontender, nondistended.  Extremities: no cyanosis, clubbing, rash, trace edema Neuro: alert & orientedx3. Affect pleasant     Telemetry    Sinus 80s, brief runs NSVT (personally reviewed)   Labs    CBC Recent Labs    11/08/22 0430 11/09/22 1403 11/09/22 2144 11/10/22 0334  WBC 8.2  --   --  12.5*  HGB 13.1   < > 13.6 14.2  HCT 37.5*   < > 40.0 40.9  MCV 83.5  --   --  84.5  PLT 250  --   --  329   < > = values in this interval not  displayed.    Basic Metabolic Panel Recent Labs    11/09/22 0501 11/09/22 1403 11/09/22 2011 11/09/22 2144 11/10/22 0334  NA 136   < > 136 135 134*  K 4.1   < > 4.8 4.5 4.9  CL 104  --  107  --  104  CO2 24  --  18*  --  20*  GLUCOSE 123*  --  215*  --  199*  BUN 32*  --  34*  --  39*  CREATININE 1.68*  --  2.22*  --  2.60*  CALCIUM 8.8*  --  8.8*  --  9.1  MG 1.9  --   --   --  2.2  PHOS  --   --   --   --  6.5*   < > = values in this interval not displayed.    Liver Function Tests No results for input(s): "AST", "ALT", "ALKPHOS", "BILITOT", "PROT", "ALBUMIN" in the last 72 hours.  No results for input(s): "LIPASE", "AMYLASE" in the last 72 hours. Cardiac Enzymes No results for input(s): "CKTOTAL", "CKMB", "CKMBINDEX", "TROPONINI" in the last 72 hours.  BNP: BNP (last 3 results) Recent Labs    10/18/22 1821 10/25/22 1442 10/28/22 1825  BNP 1,494.7* 1,428.2* 491.6*  ProBNP (last 3 results) No results for input(s): "PROBNP" in the last 8760 hours.   D-Dimer No results for input(s): "DDIMER" in the last 72 hours. Hemoglobin A1C No results for input(s): "HGBA1C" in the last 72 hours.  Fasting Lipid Panel Recent Labs    11/10/22 0334  TRIG 66    Thyroid Function Tests No results for input(s): "TSH", "T4TOTAL", "T3FREE", "THYROIDAB" in the last 72 hours.  Invalid input(s): "FREET3"   Other results:   Imaging    DG Chest Port 1 View  Result Date: 11/10/2022 CLINICAL DATA:  Short of breath, pulmonary edema EXAM: PORTABLE CHEST 1 VIEW COMPARISON:  Chest 11/09/2022 FINDINGS: Endotracheal tube in good position. NG tube in the stomach. Right arm PICC tip in the SVC. AICD unchanged in position with lead in the right ventricle. Progression of diffuse bilateral airspace disease, left greater than right. Probable pulmonary edema. No significant pleural effusion. IMPRESSION: Progression of diffuse bilateral airspace disease, left greater than right.  Probable pulmonary edema. Electronically Signed   By: Franchot Gallo M.D.   On: 11/10/2022 09:30   DG CHEST PORT 1 VIEW  Result Date: 11/09/2022 CLINICAL DATA:  Respiratory failure EXAM: PORTABLE CHEST 1 VIEW COMPARISON:  11/07/2022 FINDINGS: Endotracheal tube seen 8 cm above the carina. Nasogastric tube tip noted within the proximal body of the stomach. Right upper extremity PICC line tip noted within the superior vena cava. The lungs are symmetrically well expanded. Retrocardiac opacity is present in keeping with atelectasis, infiltrate, or posteriorly layering pleural fluid in this location. No pneumothorax. No pleural effusion on the right. Cardiac size is at the upper limits of normal. Left subclavian pacemaker defibrillator is unchanged. Probable mitral clips are now seen overlying the left cardiac silhouette. Central pulmonary vascular congestion without overt pulmonary edema again noted. IMPRESSION: 1. Support lines and tubes in appropriate position. 2. Retrocardiac opacity in keeping with atelectasis, infiltrate, or posteriorly layering pleural fluid. 3. Central pulmonary vascular congestion without overt pulmonary edema. Electronically Signed   By: Fidela Salisbury M.D.   On: 11/09/2022 20:56   CARDIAC CATHETERIZATION  Result Date: 11/09/2022 Unsuccessful MitraClip procedure secondary to severe residual MR at the completion of the procedure with torn lateral chordae tendonae. 2 MitraClip XTW devices are positioned A2/P2. Baseline MR - severe Final MR - severe See op note for detailed report   ECHO TEE  Result Date: 11/09/2022    TRANSESOPHOGEAL ECHO REPORT   Patient Name:   Douglas [name] Date of Exam: 11/09/2022 Medical Rec #:  [id number]         Height:       76.0 in Accession #:    [id number]        Weight:       270.0 lb Date of Birth:  1976-06-29        BSA:          2.517 m Patient Age:    46 years          BP:           122/97 mmHg Patient Gender: M                 HR:           82  bpm. Exam Location:  Inpatient Procedure: Transesophageal Echo, 3D Echo, Color Doppler and Cardiac Doppler Indications:     Mitral Regurgitation i34.0  History:         Patient has prior history of Echocardiogram examinations, most  recent 10/28/2022. Defibrillator; Risk Factors:Hypertension,                  Diabetes, Dyslipidemia and Cocaine Abuse.  Sonographer:     Irving Burton Senior RDCS Referring Phys:  [phone] JILL D MCDANIEL Diagnosing Phys: Charlton Haws MD  Sonographer Comments: MitraClip Procedure PROCEDURE: After discussion of the risks and benefits of a TEE, an informed consent was obtained from the patient. The transesophogeal probe was passed without difficulty through the esophogus of the patient. Sedation performed by different physician. The patient was monitored while under deep sedation. The patient developed no complications during the procedure.  IMPRESSIONS  1. Severely low cardiac output on milrinone with LVOT TVI 6. Left ventricular ejection fraction, by estimation, is <20%. The left ventricle has severely decreased function. The left ventricular internal cavity size was moderately dilated. Left ventricular diastolic parameters are indeterminate.  2. Right ventricular systolic function is mildly reduced. The right ventricular size is moderately enlarged.  3. Left atrial size was moderately dilated. No left atrial/left atrial appendage thrombus was detected.  4. Pre clip severe central MR. Functional from LV dysfunction and restricted posteior leaflet Post placement of two XTW clips between A2/P2 MR remains severe ? damage to lateral chords on attempt to place 3 rd NT clip Severe residual MR more posteriorly  directed than original jet and eccentic Mean gradient post 2 clip implants 7 mmhg peak 15 mmHg at HR of 104 bpm. The mitral valve has been repaired/replaced. Severe mitral valve regurgitation. No evidence of mitral stenosis.  5. Tricuspid valve regurgitation is moderate.  6. The  aortic valve is tricuspid. Aortic valve regurgitation is not visualized. No aortic stenosis is present.  7. Left to right shunt post trans septal. FINDINGS  Left Ventricle: Severely low cardiac output on milrinone with LVOT TVI 6. Left ventricular ejection fraction, by estimation, is <20%. The left ventricle has severely decreased function. The left ventricular internal cavity size was moderately dilated. There is no left ventricular hypertrophy. Left ventricular diastolic parameters are indeterminate. Right Ventricle: The right ventricular size is moderately enlarged. Right vetricular wall thickness was not assessed. Right ventricular systolic function is mildly reduced. Left Atrium: Left atrial size was moderately dilated. No left atrial/left atrial appendage thrombus was detected. Right Atrium: Right atrial size was normal in size. Pericardium: There is no evidence of pericardial effusion. Mitral Valve: Pre clip severe central MR. Functional from LV dysfunction and restricted posteior leaflet Post placement of two XTW clips between A2/P2 MR remains severe ? damage to lateral chords on attempt to place 3 rd NT clip Severe residual MR more posteriorly directed than original jet and eccentic Mean gradient post 2 clip implants 7 mmhg peak 15 mmHg at HR of 104 bpm. The mitral valve has been repaired/replaced. Severe mitral valve regurgitation. No evidence of mitral valve stenosis. MV peak gradient, 13.1 mmHg. The mean mitral valve gradient is 5.0 mmHg. Tricuspid Valve: The tricuspid valve is normal in structure. Tricuspid valve regurgitation is moderate. Aortic Valve: The aortic valve is tricuspid. Aortic valve regurgitation is not visualized. No aortic stenosis is present. Pulmonic Valve: The pulmonic valve was grossly normal. Pulmonic valve regurgitation is mild. Aorta: The aortic root is normal in size and structure. IAS/Shunts: Left to right shunt post trans septal. Additional Comments: Spectral Doppler performed.  AORTIC VALVE LVOT Vmax:   51.10 cm/s LVOT Vmean:  36.350 cm/s LVOT VTI:    0.056 m MITRAL VALVE  TRICUSPID VALVE MV Peak grad: 13.1 mmHg       TR Peak grad:   67.6 mmHg MV Mean grad: 5.0 mmHg        TR Vmax:        411.00 cm/s MV Vmax:      1.81 m/s MV Vmean:     96.5 cm/s       SHUNTS MR Peak grad:    124.1 mmHg   Systemic VTI: 0.06 m MR Mean grad:    74.0 mmHg MR Vmax:         557.00 cm/s MR Vmean:        406.0 cm/s MR PISA:         6.28 cm MR PISA Eff ROA: 42 mm MR PISA Radius:  1.00 cm Jenkins Rouge MD Electronically signed by Jenkins Rouge MD Signature Date/Time: 11/09/2022/6:24:09 PM    Final      Medications:     Scheduled Medications:  albuterol  2.5 mg Nebulization Once   arformoterol  15 mcg Nebulization BID   aspirin  81 mg Per Tube Daily   atorvastatin  80 mg Per Tube Daily   Chlorhexidine Gluconate Cloth  6 each Topical Daily   docusate  100 mg Per Tube BID   enoxaparin (LOVENOX) injection  60 mg Subcutaneous Q24H   famotidine  20 mg Per Tube Daily   guaiFENesin  600 mg Oral BID   insulin aspart  2-6 Units Subcutaneous Q4H   insulin glargine-yfgn  17 Units Subcutaneous Daily   mouth rinse  15 mL Mouth Rinse Q2H   polyethylene glycol  17 g Per Tube Daily   revefenacin  175 mcg Nebulization Daily   sodium chloride flush  10-40 mL Intracatheter Q12H   sodium chloride flush  3 mL Intravenous Q12H   sodium chloride flush  3 mL Intravenous Q12H   sodium chloride flush  3 mL Intravenous Q12H    Infusions:  sodium chloride     sodium chloride     amiodarone 30 mg/hr (11/10/22 0600)   dexmedetomidine (PRECEDEX) IV infusion     epinephrine 0.5 mcg/min (11/10/22 0600)   milrinone 0.25 mcg/kg/min (11/10/22 0624)   norepinephrine (LEVOPHED) Adult infusion 39 mcg/min (11/10/22 0600)    PRN Medications: sodium chloride, sodium chloride, acetaminophen, albuterol, ALPRAZolam, fentaNYL (SUBLIMAZE) injection, fentaNYL (SUBLIMAZE) injection, melatonin, midazolam, mouth  rinse, polyethylene glycol, prochlorperazine, sodium chloride flush, sodium chloride flush, sodium chloride flush    Patient Profile   Douglas [name] is a 46 year old with a history of HTN, hyperlipidemia, smoker, cocaine abuse,NSVT,  NICM, Medtronic ICD, and chronic HFrEF. Last used cocaine about a week ago.  EF has been down for some time.    Admitted recently with A/C HFrEF. Suspect he had residual congestion when discharged.  Readmitted 12/11 with A/C HFrEF.  Assessment/Plan   1. A/C HFrEF  - NICM. Has Medtronic ICD. Had [place] earlier this year with nonobstructive CAD.   - Admit early December. Suspect still had volume on board at discharge + increased fluid intake leading to readmission several days later. - NYHA IV on admit - TEE this admit EF 20%, severe central MR - RHC on 11/05/22 RA 2 PCW 33 (v = 48) CI ~2.0  - s/p unsuccessful MitraClip secondary to severe residual MR with torn lateral chordae tendonae - Echo this am pending - CO-OX 86%. Recheck.  - Currently on milrinone 0.25 + 37 NE + 0.5 Epi. Stop Epi, Wean off NE as able.  CCM adjusting sedation. Thought to be possibly contributing to pressor requirement - Give 80 mg lasix IV X 1. 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 This may be the last opportunity to consider advanced therapies including VAD. However, this decision is complicated by ongoing drug use and difficult social situation. Has been turned down for VAD due primarily due to limited social support. Planning to reevaluate options with the team now that he had unsuccessful MitraClip  2. AKI on CKD Stage IIIb - Creatinine on admit 2. Baseline previously ~ 1.6.  - Cr 1.7 >> 2.60 today - ? D/t episodes of hypotension - Diuretics as above. - Continue inotrope support   3. Substance Abuse - Last used cocaine about a week PTA. - Discussed cessation.  - He is motivated to quit  4. CAD - LHC  02/2022 nonobstructive CAD - LDL 124  - Continue atorvastatin    5. DMII -On SSI -Holding Farxiga for now   6. HTN  - Stable    7. NSVT - Previously on amio but he stopped in April of this year.  I am not sure why he stopped.  - Frequent burst of NSVT noted on device since the beginning of December.  - Now on IV amio d/t frequent runs NSVT.  - Continue IV amio while on inotrope support - Short runs NSVT on tele - Keep K > 4.0 Mg > 2.0  - Off Coreg with low-output   8. Obesity  - Body mass index is 33.71 kg/m.   9. MR  - TEE 12/14: EF 20%, Severe central MR - S/p unsuccessful mTEER 12/26 as above with residual severe MR  10. Mediastinal lymph nodes, GGO RUL - Noted on preVAD CT - Will need repeat imaging in 3-6 months to follow  11. Urinary retention - Place foley  SDOH: -Has medicaid -Works part-time at [employer].  -SW and VAD coordinators evaluated patient. Currently living with Aunt and sleeps on the floor. Would need more stable living situation prior to VAD. -Has been turned down for VAD due primarily due to limited social support.     Length of Stay: 16  Douglas [name], [place], PA-C  9:46 AM  Advanced Heart Failure Team Pager [phone] (M-F; 7a - 5p)  Please contact [facility] for night-coverage after hours (5p -7a ) and weekends on [email or web address]  Agree with above  S/p mTEER yesterday. Still with severe MR. Awake on vent. On multiple pressors. Co-ox ok. Rhythm stable.  Scr 1.7 -> 2.6  General:  Awake on vent  HEENT: normal Neck: supple. JVP to jaw  Carotids 2+ bilat; no bruits. No lymphadenopathy or thryomegaly appreciated. Cor: PMI nondisplaced. Regular rate & rhythm. 2/6 MR Lungs: coarse Abdomen: soft, nontender, nondistended. No hepatosplenomegaly. No bruits or masses. Good bowel sounds. Extremities: no cyanosis, clubbing, rash, edema Neuro: on vent following commands  Remains very tenuous. On vent Requiring multiple pressors. Now with AKI. Volume  up.   Agree with diuresis. D/w CCM will try slow vent wean. Suspect we will need to revisit possible VAD if renal function recovers. If unable to wean vent may need IABP as bridge.  CRITICAL CARE Performed by: Glori Bickers  Total critical care time: 45 minutes  Critical care time was exclusive of separately billable procedures and treating other patients.  Critical care was necessary to treat or prevent imminent or life-threatening deterioration.  Critical care was time spent personally by  me (independent of midlevel providers or residents) on the following activities: development of treatment plan with patient and/or surrogate as well as nursing, discussions with consultants, evaluation of patient's response to treatment, examination of patient, obtaining history from patient or surrogate, ordering and performing treatments and interventions, ordering and review of laboratory studies, ordering and review of radiographic studies, pulse oximetry and re-evaluation of patient's condition.  Glori Bickers, MD  2:26 PM

## 2022-11-10 NOTE — Progress Notes (Signed)
eLink Physician-Brief Progress Note Patient Name: Douglas [name] DOB: [birth year] MRN: [id number]   Date of Service  11/10/2022  HPI/Events of Note  Patient with severe cardiomyopathy with advanced systolic  heart failure, he is hypotensive likely secondary to exquisite sensitivity to Propofol.  eICU Interventions  Plan is to titrate up Fentanyl and titrate down Propofol to the lowest gtt rate compatible with a RAS of approximately -2, target is 200 - 300 mcg of Fentanyl and 5 - 20 mcg of Propofol, will also add low dose Epinephrine gtt for inotropic support. If the blood pressure stabilizes he will likely need gentle diuresis for his CVP of 11.        Thomasene Lot Kaitlyn Skowron 11/10/2022, 2:05 AM

## 2022-11-10 NOTE — Progress Notes (Signed)
Awake. No distress.  Tolerating PSV of 5  Plan Extubate to NIPPV  Simonne Martinet ACNP-BC [facility] Pulmonary/Critical Care Pager # [phone] OR # [phone] if no answer

## 2022-11-10 NOTE — Progress Notes (Addendum)
NAME:  Douglas [name], MRN:  [id number], DOB:  [birth year], LOS: 13 ADMISSION DATE:  10/25/2022, CONSULTATION DATE:  12/26 REFERRING MD:  Cardiology - Bensimhon, CHIEF COMPLAINT:  respiratory failure   History of Present Illness:  Douglas [name] is a 46 y/o gentleman with a history of cocaine abuse, former tobacco abuse, NICM, HFrEF who presented with acute decompensated heart failure on 12/11 after being discharged from a recent admission for hypervolemia. He required inotropic support for diuresis and was tolerating GDMT vasodilators and spironolactone, farxiga. He has had episodes of NSVT this admission. He has severe MR and underwent mitral clip x 3 with decompensation and increasing pulmonary edema.  Due to increasing oxygen requirements during the case he remained intubated and was transferred to the ICU.  PCCM was consulted for critical care management.  Pertinent  Medical History  Cocaine abuse HFrEF, NICM HTN DM NSVT Tobacco abuse CKD 3b  Significant Hospital Events: Including procedures, antibiotic start and stop dates in addition to other pertinent events   12/11 admitted 12/14 TEE showing EF 20% with global hypokinesis, severely dilated LA, severe functional MR, moderate TR 12/26 mitral clip 12/27 hypotensive overnight. On high dose NE and still on Epi. His Co-ox looks good. CVP 8-12  Interim History / Subjective:  Wants tube out    Objective   Blood pressure (!) 122/97, pulse (!) 105, temperature 98.3 F (36.8 C), temperature source Oral, resp. rate 18, height 6\' 4"  (1.93 m), weight 122.5 kg, SpO2 94 %. CVP:  [8 mmHg-16 mmHg] 16 mmHg  Vent Mode: PRVC FiO2 (%):  [100 %] 100 % Set Rate:  [18 bmp] 18 bmp Vt Set:  [690 mL] 690 mL PEEP:  [10 cmH20] 10 cmH20 Plateau Pressure:  [27 cmH20] 27 cmH20   Intake/Output Summary (Last 24 hours) at 11/09/2022 1824 Last data filed at 11/09/2022 1728 Gross per 24 hour  Intake 1947.07 ml  Output 580 ml  Net 1367.07 ml   Filed  Weights   11/06/22 0426 11/07/22 0300 11/08/22 0410  Weight: 122.5 kg 123 kg 122.5 kg    Examination:  General 46 year old male patient resting in bed no acute distress HEENT normocephalic atraumatic no jugular venous distention orally intubated Pulmonary: Diminished bases no accessory use on full ventilator support Cardiac: Regular rate and rhythm Abdomen: Soft nontender Extremities: Show a cool, pulses are palpable, trace lower extremity edema Neuro: Awake, moves all extremities  Resolved Hospital Problem list     Assessment & Plan:  Acute on chronic HfrEF Severe MR, s/p mitral clip  NSVT Plan Cont milrinone Holding bb and entreso due to shock Cont farxiga  Titrate NE for MAP > 65 Cont amiodarone  Daily assessment for diuresis Discontinue hydralazine, need to hold Imdur  Acute respiratory failure with hypoxia Acute pulmonary edema Plan Cont full vent support Once he is on precedex can attempt SBT but given pressor requirements at this point would hold off from extubation VAP bundle PAD protocol  Repeat CXR today f/u edema  Daily assessment for diuresis  HLD Plan statin  Acute on chronic renal failure (CKD IIIb) w/ urinary retention and rising sCr Plan Place foley cath  Avoid hypotension Strict I&O Renal dose meds  Hyperkalemia (stable) Plan Repeat afternoon  If rising will need to hold farxiga   Mild leukocytosis Plan Trend   Qtc prolongation d/c'd  QT prolonging meds (pepcid) Current qtc 497 Plan Avoid qtc prolonging agents Intermittent 12 lead  Mediastinal adenopathy GGO on CT Plan repeat imaging after  6 months  DM with uncontrolled hyperglycemia;  Plan Ssi goal 140-180 Increase glargine to 17  units Cont farxiga    Best Practice (right click and "Reselect all SmartList Selections" daily)   Diet/type: NPO DVT prophylaxis: LMWH GI prophylaxis: PPI Lines: Central line and Arterial Line Foley:  Yes, and it is still needed Code  Status:  full code Last date of multidisciplinary goals of care discussion [  ]  My cct 34 min Simonne Martinet ACNP-BC [facility] Pager # [phone] OR # [phone] if no answer

## 2022-11-10 NOTE — Progress Notes (Signed)
Brief Nutrition Follow-up:  Extubated today, currently NPO, on Bipap  PO intake mostly 85-100% prior to procedure  Interventions: Plan to resume nutrition interventions once diet advanced. Pt previously receiving double protein at meals, HS snack and MVI  Romelle Starcher MS, RDN, LDN, CNSC Registered Dietitian 3 Clinical Nutrition RD Pager and On-Call Pager Number Located in [place]

## 2022-11-10 NOTE — Procedures (Signed)
Extubation Procedure Note  Patient Details:   Name: Douglas [name] DOB: Jun 17, 1976 MRN: [id number]   Airway Documentation:    Vent end date: 11/10/22 Vent end time: 1500   Evaluation  O2 sats: stable throughout Complications: No apparent complications Patient did tolerate procedure well. Bilateral Breath Sounds: Diminished   Yes  Geary Rufo 11/10/2022, 3:07 PM

## 2022-11-10 NOTE — Progress Notes (Addendum)
[facility] VALVE TEAM  Patient Name: Douglas [name] Date of Encounter: 11/10/2022  Admit date: 10/25/2022  Primary Care Provider: Gildardo Pounds, NP [facility] Cardiologist: None  [facility] Electrophysiologist:  None   Hospital Problem List     Principal Problem:   Acute on chronic systolic CHF (congestive heart failure) ([place]) Active Problems:   Diabetes mellitus with stage 2 chronic kidney disease ([place])   Hyperlipidemia   Essential hypertension   Cocaine use   Obesity   Depression, major, single episode   Acute renal failure superimposed on stage IIIb chronic kidney disease ([facility])   Nonrheumatic mitral (valve) insufficiency   NSVT (nonsustained ventricular tachycardia) ([facility])   Anxiety     Subjective   Still intubated. Alert and oriented. No pain.   Inpatient Medications    Scheduled Meds:  albuterol  2.5 mg Nebulization Once   arformoterol  15 mcg Nebulization BID   aspirin  81 mg Per Tube Daily   atorvastatin  80 mg Per Tube Daily   Chlorhexidine Gluconate Cloth  6 each Topical Daily   docusate  100 mg Per Tube BID   enoxaparin (LOVENOX) injection  60 mg Subcutaneous Q24H   guaiFENesin  600 mg Oral BID   insulin aspart  2-6 Units Subcutaneous Q4H   insulin glargine-yfgn  17 Units Subcutaneous Daily   mouth rinse  15 mL Mouth Rinse Q2H   pantoprazole (PROTONIX) IV  40 mg Intravenous QHS   polyethylene glycol  17 g Per Tube Daily   potassium chloride  40 mEq Oral Daily   revefenacin  175 mcg Nebulization Daily   sodium chloride flush  10-40 mL Intracatheter Q12H   sodium chloride flush  3 mL Intravenous Q12H   sodium chloride flush  3 mL Intravenous Q12H   sodium chloride flush  3 mL Intravenous Q12H   spironolactone  12.5 mg Per Tube Daily   Continuous Infusions:  sodium chloride     sodium chloride     amiodarone 30 mg/hr (11/10/22 0600)   dexmedetomidine (PRECEDEX) IV infusion     epinephrine 0.5  mcg/min (11/10/22 0600)   milrinone 0.25 mcg/kg/min (11/10/22 0624)   norepinephrine (LEVOPHED) Adult infusion 39 mcg/min (11/10/22 0600)   PRN Meds: sodium chloride, sodium chloride, acetaminophen, albuterol, ALPRAZolam, fentaNYL (SUBLIMAZE) injection, fentaNYL (SUBLIMAZE) injection, melatonin, midazolam, mouth rinse, polyethylene glycol, prochlorperazine, sodium chloride flush, sodium chloride flush, sodium chloride flush   Vital Signs    Vitals:   11/10/22 0416 11/10/22 0500 11/10/22 0600 11/10/22 0800  BP:    (!) 94/56  Pulse: 72 72 72 74  Resp: 18 18 18 18   Temp:      TempSrc:      SpO2: 99% 99% 99% 97%  Weight:  125.6 kg    Height:        Intake/Output Summary (Last 24 hours) at 11/10/2022 0922 Last data filed at 11/10/2022 0600 Gross per 24 hour  Intake 2475.13 ml  Output 575 ml  Net 1900.13 ml   Filed Weights   11/07/22 0300 11/08/22 0410 11/10/22 0500  Weight: 123 kg 122.5 kg 125.6 kg    Physical Exam    GEN: intubated but alert and oriented HEENT: Grossly normal.  Neck: Supple, no JVD, or masses. Cardiac: RRR, soft holosystolic murmur at apex.  No rubs, or gallops. No clubbing, cyanosis, edema.   Respiratory:  Respirations regular and unlabored, clear to auscultation bilaterally. GI: Soft, nontender, nondistended, BS +  x 4. MS: no deformity or atrophy. Skin: warm and dry, no rash.  Groin site clear without hematoma or ecchymosis  Neuro:  Strength and sensation are intact. Psych: AAOx3.  Normal affect.  Labs    CBC Recent Labs    11/08/22 0430 11/09/22 1403 11/09/22 2144 11/10/22 0334  WBC 8.2  --   --  12.5*  HGB 13.1   < > 13.6 14.2  HCT 37.5*   < > 40.0 40.9  MCV 83.5  --   --  84.5  PLT 250  --   --  329   < > = values in this interval not displayed.   Basic Metabolic Panel Recent Labs    61/60/73 0501 11/09/22 1403 11/09/22 2011 11/09/22 2144 11/10/22 0334  NA 136   < > 136 135 134*  K 4.1   < > 4.8 4.5 4.9  CL 104  --  107  --   104  CO2 24  --  18*  --  20*  GLUCOSE 123*  --  215*  --  199*  BUN 32*  --  34*  --  39*  CREATININE 1.68*  --  2.22*  --  2.60*  CALCIUM 8.8*  --  8.8*  --  9.1  MG 1.9  --   --   --  2.2  PHOS  --   --   --   --  6.5*   < > = values in this interval not displayed.   Liver Function Tests No results for input(s): "AST", "ALT", "ALKPHOS", "BILITOT", "PROT", "ALBUMIN" in the last 72 hours. No results for input(s): "LIPASE", "AMYLASE" in the last 72 hours. Cardiac Enzymes No results for input(s): "CKTOTAL", "CKMB", "CKMBINDEX", "TROPONINI" in the last 72 hours. BNP Invalid input(s): "POCBNP" D-Dimer No results for input(s): "DDIMER" in the last 72 hours. Hemoglobin A1C No results for input(s): "HGBA1C" in the last 72 hours. Fasting Lipid Panel Recent Labs    11/10/22 0334  TRIG 66   Thyroid Function Tests No results for input(s): "TSH", "T4TOTAL", "T3FREE", "THYROIDAB" in the last 72 hours.  Invalid input(s): "FREET3"  Telemetry    Sinus with frequent PVCs, NSVT - Personally Reviewed  ECG    Sinus, low voltage QRS, prolonged QT. HR 76.  - Personally Reviewed  Radiology    DG CHEST PORT 1 VIEW  Result Date: 11/09/2022 CLINICAL DATA:  Respiratory failure EXAM: PORTABLE CHEST 1 VIEW COMPARISON:  11/07/2022 FINDINGS: Endotracheal tube seen 8 cm above the carina. Nasogastric tube tip noted within the proximal body of the stomach. Right upper extremity PICC line tip noted within the superior vena cava. The lungs are symmetrically well expanded. Retrocardiac opacity is present in keeping with atelectasis, infiltrate, or posteriorly layering pleural fluid in this location. No pneumothorax. No pleural effusion on the right. Cardiac size is at the upper limits of normal. Left subclavian pacemaker defibrillator is unchanged. Probable mitral clips are now seen overlying the left cardiac silhouette. Central pulmonary vascular congestion without overt pulmonary edema again noted.  IMPRESSION: 1. Support lines and tubes in appropriate position. 2. Retrocardiac opacity in keeping with atelectasis, infiltrate, or posteriorly layering pleural fluid. 3. Central pulmonary vascular congestion without overt pulmonary edema. Electronically Signed   By: Helyn Numbers M.D.   On: 11/09/2022 20:56   CARDIAC CATHETERIZATION  Result Date: 11/09/2022 Unsuccessful MitraClip procedure secondary to severe residual MR at the completion of the procedure with torn lateral chordae tendonae. 2 MitraClip XTW devices are  positioned A2/P2. Baseline MR - severe Final MR - severe See op note for detailed report   ECHO TEE  Result Date: 11/09/2022    TRANSESOPHOGEAL ECHO REPORT   Patient Name:   Douglas [name] Date of Exam: 11/09/2022 Medical Rec #:  [id number]         Height:       76.0 in Accession #:    [id number]        Weight:       270.0 lb Date of Birth:  07/10/1976        BSA:          2.517 m Patient Age:    46 years          BP:           122/97 mmHg Patient Gender: M                 HR:           82 bpm. Exam Location:  Inpatient Procedure: Transesophageal Echo, 3D Echo, Color Doppler and Cardiac Doppler Indications:     Mitral Regurgitation i34.0  History:         Patient has prior history of Echocardiogram examinations, most                  recent 10/28/2022. Defibrillator; Risk Factors:Hypertension,                  Diabetes, Dyslipidemia and Cocaine Abuse.  Sonographer:     Raquel Sarna Senior RDCS Referring Phys:  [place] Diagnosing Phys: Jenkins Rouge MD  Sonographer Comments: MitraClip Procedure PROCEDURE: After discussion of the risks and benefits of a TEE, an informed consent was obtained from the patient. The transesophogeal probe was passed without difficulty through the esophogus of the patient. Sedation performed by different physician. The patient was monitored while under deep sedation. The patient developed no complications during the procedure.  IMPRESSIONS  1. Severely low  cardiac output on milrinone with LVOT TVI 6. Left ventricular ejection fraction, by estimation, is <20%. The left ventricle has severely decreased function. The left ventricular internal cavity size was moderately dilated. Left ventricular diastolic parameters are indeterminate.  2. Right ventricular systolic function is mildly reduced. The right ventricular size is moderately enlarged.  3. Left atrial size was moderately dilated. No left atrial/left atrial appendage thrombus was detected.  4. Pre clip severe central MR. Functional from LV dysfunction and restricted posteior leaflet Post placement of two XTW clips between A2/P2 MR remains severe ? damage to lateral chords on attempt to place 3 rd NT clip Severe residual MR more posteriorly  directed than original jet and eccentic Mean gradient post 2 clip implants 7 mmhg peak 15 mmHg at HR of 104 bpm. The mitral valve has been repaired/replaced. Severe mitral valve regurgitation. No evidence of mitral stenosis.  5. Tricuspid valve regurgitation is moderate.  6. The aortic valve is tricuspid. Aortic valve regurgitation is not visualized. No aortic stenosis is present.  7. Left to right shunt post trans septal. FINDINGS  Left Ventricle: Severely low cardiac output on milrinone with LVOT TVI 6. Left ventricular ejection fraction, by estimation, is <20%. The left ventricle has severely decreased function. The left ventricular internal cavity size was moderately dilated. There is no left ventricular hypertrophy. Left ventricular diastolic parameters are indeterminate. Right Ventricle: The right ventricular size is moderately enlarged. Right vetricular wall thickness was not assessed. Right ventricular systolic function is mildly  reduced. Left Atrium: Left atrial size was moderately dilated. No left atrial/left atrial appendage thrombus was detected. Right Atrium: Right atrial size was normal in size. Pericardium: There is no evidence of pericardial effusion. Mitral  Valve: Pre clip severe central MR. Functional from LV dysfunction and restricted posteior leaflet Post placement of two XTW clips between A2/P2 MR remains severe ? damage to lateral chords on attempt to place 3 rd NT clip Severe residual MR more posteriorly directed than original jet and eccentic Mean gradient post 2 clip implants 7 mmhg peak 15 mmHg at HR of 104 bpm. The mitral valve has been repaired/replaced. Severe mitral valve regurgitation. No evidence of mitral valve stenosis. MV peak gradient, 13.1 mmHg. The mean mitral valve gradient is 5.0 mmHg. Tricuspid Valve: The tricuspid valve is normal in structure. Tricuspid valve regurgitation is moderate. Aortic Valve: The aortic valve is tricuspid. Aortic valve regurgitation is not visualized. No aortic stenosis is present. Pulmonic Valve: The pulmonic valve was grossly normal. Pulmonic valve regurgitation is mild. Aorta: The aortic root is normal in size and structure. IAS/Shunts: Left to right shunt post trans septal. Additional Comments: Spectral Doppler performed. AORTIC VALVE LVOT Vmax:   51.10 cm/s LVOT Vmean:  36.350 cm/s LVOT VTI:    0.056 m MITRAL VALVE                  TRICUSPID VALVE MV Peak grad: 13.1 mmHg       TR Peak grad:   67.6 mmHg MV Mean grad: 5.0 mmHg        TR Vmax:        411.00 cm/s MV Vmax:      1.81 m/s MV Vmean:     96.5 cm/s       SHUNTS MR Peak grad:    124.1 mmHg   Systemic VTI: 0.06 m MR Mean grad:    74.0 mmHg MR Vmax:         557.00 cm/s MR Vmean:        406.0 cm/s MR PISA:         6.28 cm MR PISA Eff ROA: 42 mm MR PISA Radius:  1.00 cm Jenkins Rouge MD Electronically signed by Jenkins Rouge MD Signature Date/Time: 11/09/2022/6:24:09 PM    Final     Cardiac Studies    TEE 10/28/22 FINDINGS:   LEFT VENTRICLE: Dilated EF = 20%. Global HK  RIGHT VENTRICLE: Moderately reduced   LEFT ATRIUM: Severely dilated with smoke   LEFT ATRIAL APPENDAGE: No thrombus.    RIGHT ATRIUM: Moderately dilated   AORTIC VALVE:   Trileaflet. Mildly calcified No AI   MITRAL VALVE:    Normal. Severe central functional MR with mild restriction of posterior leaflet. No significant flow reversal in pulmonary veins   TRICUSPID VALVE: Normal. Moderate TR   PULMONIC VALVE: Grossly normal. Trivial PI   INTERATRIAL SEPTUM: No PFO or ASD.   PERICARDIUM: No effusion   DESCENDING AORTA: Mild plque     CONCLUSION:     Benay Spice 8:19 AM     [facility]   MULTIDISCIPLINARY HEART TEAM   Date of Procedure:                11/09/2022   Preoperative Diagnosis:      Severe Symptomatic Mitral Regurgitation (Stage D)   Postoperative Diagnosis:    Same    Procedure Performed: Ultrasound-guided right transfemoral venous access Double PreClose right femoral vein Transseptal puncture using Bailess RF needle  Mitral valve repair with MitraClip XTW x 2   Surgeon: Blane Ohara, MD  Co-Surgeon: Lenna Sciara, MD   Echocardiographer: Jenkins Rouge, MD   Anesthesiologist: Nolon Nations, MD   Device Implant: Mitraclip XTW x 2   Procedural Indication: Severe Non-rheumatic Mitral Regurgitation (Stage D)    Brief History: 46 year old gentleman with advanced heart failure, NYHA functional class IV symptoms, milrinone dependent.  The patient has been evaluated for advanced heart failure therapies but was not felt to be a candidate because of social circumstances.  He underwent right heart catheterization and transesophageal echo assessment, demonstrating anatomic suitability for transcatheter edge-to-edge repair of the mitral valve.  After multidisciplinary review including team discussion with the advanced heart failure providers, we elected to proceed with MitraClip.   Echo Findings: Preop:  Severe global LV systolic dysfunction LVEF less than 25% Severe mitral regurgitation secondary to annular dilatation with leaflet mall coaptation, grade 4+ Post-op:  Unchanged LV systolic function Severe  residual MR, with a large lateral jet of mitral regurgitation secondary to torn cords   Final conclusion: Unsuccessful transcatheter edge-to-edge repair of the mitral valve due to torn lateral cords with severe residual mitral regurgitation.  2 XTW clips were placed A2/P2, but the third clip (NTW) is removed from the body without being deployed.        Patient Profile     Douglas [name] is a 46 y.o. male with a history of HTN, HLD, obesity, ongoing cocaine and tobacco abuse, medical noncompliance, IDDM, CKD stage IIIb, NSVT, NICM s/p [employer] ICD, and chronic HFrEF with recent admission who was readmitted 10/25/22 with acute on chronic end stage heart failure and severe functional mitral regurgitation. He was turned down for LVAD/ home advanced therapies given poor social support and the structural heart team was consulted for mTEER.   Assessment & Plan    Severe functional MR: s/p unsuccessful transcatheter edge-to-edge repair of the mitral valve due to torn lateral cords with severe residual mitral regurgitation.  2 XTW clips were placed A2/P2, but the third clip (NTW) was removed from the body without being deployed. Continue aspirin 81mg  daily.   Acute respiratory failure: he had acute decompensation and increasing pulmonary edema during case. Due to increasing oxygen requirements during the case he remained intubated and was transferred to the ICU. Appreciate PCCM vent management.  Acute HFrEF: under the care of the advanced CHF team and PCCM. Some GDMT being held given hypotension. Currently on milrinone and Levophed and epi.  NSVT: continue IV amio  CKD stage IIIb: creat with slight worsening 2.6. Continue to monitor.   Prolonged QTc: Pepcid discontinued .    Signed, Angelena Form, PA-C  11/10/2022, 9:22 AM  Pager [phone]  Patient seen, examined. Available data reviewed. Agree with findings, assessment, and plan as outlined by Nell Range, PA-C.  Patient intubated  and sedated, but he is following commands.  Status post complex transcatheter edge-to-edge repair of the mitral valve yesterday with residual severe MR due to chordal entrapment in the lateral portion of the valve.  Patient requiring increased inotropic therapy today, hopefully this is secondary to sedation and he will wean off of Levophed and low-dose epinephrine.  He continues on IV amiodarone and milrinone with end-stage heart failure.  Management per advanced heart failure team.  On my exam today, he is intubated and sedated but able to follow some commands.  Lungs are clear, heart is tachycardic and regular with a 2/6 systolic murmur at the left lower sternal  border, right groin site is clear with no hematoma or ecchymosis, no pretibial edema is noted.  Further management per advanced heart failure team.  Sherren Mocha, M.D. 11/10/2022 10:26 AM

## 2022-11-10 NOTE — Telephone Encounter (Signed)
CSW attempted to call pt sister to see if she had been able to speak with other family members regarding a place for the pt to stay when he leaves the hospital- unable to reach- unable to leave a VM- will attempt to call again later.  [name], LCSW Clinical Social Worker [facility] Desk#: [phone] Cell#: [phone]

## 2022-11-10 NOTE — Op Note (Signed)
PROCEDURE:  Transcatheter edge to edge mitral valve repair (TEER) INDICATION: Severe symptomatic mitral regurgitation (Stage D)  SURGEON:  [name], MD CO-SURGEON: Alverda Skeans, MD  PROCEDURAL DETAILS: General anesthesia is induced.  The patient is prepped and draped.  Baseline transesophageal echo images are obtained and confirm appropriate anatomy for transcatheter edge-to-edge mitral valve repair.  Using vascular ultrasound guidance, the right common femoral vein is accessed via a front wall puncture.  2 Perclose sutures are deployed and an 8 [place] sheath is inserted.  A versa cross wire is advanced into the SVC.  Heparin is administered and a therapeutic ACT is achieved.  Transseptal puncture is performed over the mid posterior portion of the fossa.  The septum is dilated while the MitraClip steerable guide catheter is prepped.  After progressively dilating the right common femoral vein, the 24 French MitraClip steerable guide catheter is inserted and advanced across the interatrial septum.  The dilator and the versa cross wire were carefully removed and the guide tip is appropriately positioned approximately 3 cm across the interatrial septum.  A MitraClip XTW device is prepped per protocol.  The device is inserted through the steerable guide catheter with caution taken to avoid air entrapment.  The MitraClip device is positioned above the mitral valve after applying appropriate curve to the guide catheter.  The MitraClip device is then oriented coaxially with the anterior and posterior leaflets of the mitral valve.  Low tidal volume ventilation is initiated.  The clip device is advanced across the mitral valve and pulled back until capture of both the anterior and posterior leaflets is achieved.  Grippers are dropped and the clip is closed under TEE guidance.  Careful TEE assessment is performed and reduction in mitral valve regurgitation is felt to be appropriate.  Leaflet insertion is  verified using 3D imaging.  The MitraClip device is deployed using normal technique and the clip delivery system is removed.  After full TEE assessment, the procedural result is felt to be adequate with reduction of mitral regurgitation to 3+.   Second clip A MitraClip XTW device is prepped per protocol.  The device is inserted through the steerable guide catheter with caution taken to avoid air entrapment.  The MitraClip device is positioned above the mitral valve after applying appropriate curve to the guide catheter.  The MitraClip device is then oriented coaxially with the anterior and posterior leaflets of the mitral valve.  Low tidal volume ventilation is initiated.  The clip device is advanced across the mitral valve and pulled back until capture of both the anterior and posterior leaflets is achieved.  Grippers are dropped and the clip is closed under TEE guidance.  Careful TEE assessment is performed and reduction in mitral valve regurgitation is felt to be appropriate.  Leaflet insertion is verified using 3D imaging.  The MitraClip device is deployed using normal technique and the clip delivery system is removed.  After full TEE assessment, the procedural result is felt to be adequate with reduction of mitral regurgitation to 2+.  Third clip A MitraClip NTW device is prepped per protocol.  The device is inserted through the steerable guide catheter with caution taken to avoid air entrapment.  The MitraClip device is positioned above the mitral valve after applying appropriate curve to the guide catheter.  The MitraClip device is then oriented coaxially with the anterior and posterior leaflets of the mitral valve.  Low tidal volume ventilation is initiated.  The clip device is advanced across the  mitral valve and pulled back until capture of both the anterior and posterior leaflets is achieved.  We tried several grasping attempts which were complicated by chordal entanglement.  After moving back into  the left atrium we did notice that the mitral regurgitation was increased likely due to chordal disruption with flail.  After a few more unsuccessful attempts at obtaining a grasp we decided to defer any further attempts.  We were left with severe mitral regurgitation of the lateral aspect of the valve due to disrupted cord.  PROCEDURE COMPLETION: The steerable guide catheter is pulled back into the right atrium and the interatrial septum is assessed with TEE.  There is no significant septal injury seen and no right to left shunting.  The guide catheter is removed and the Perclose sutures are tightened.  Protamine is administered.  CONCLUSION: Unsuccessful transcatheter edge-to-edge mitral valve repair under fluoroscopic and echo guidance, with residual severe lateral mitral regurgitation due to chordal disruption with 2 XTW clips being placed at the A2/P2 position.  Orbie Pyo 11/10/2022 7:45 AM

## 2022-11-10 NOTE — Progress Notes (Signed)
Patient ID: HAJIME ASFAW, male   DOB: January 23, 1976, 46 y.o.   MRN: [id number]    Progress Note from the Palliative Medicine Team at [facility]   Patient Name: Douglas [name]        Date: 11/10/2022 DOB: Feb 28, 1976  Age: 46 y.o. MRN#: [id number] Attending Physician: Lynnell Catalan, MD Primary Care Physician: Claiborne Rigg, NP Admit Date: 10/25/2022   Medical records reviewed, assessed the patient, discussed with treatment team   46 y/o male with a history of cocaine abuse, former tobacco abuse, NICM, HFrEF who presented with acute decompensated heart failure on 12/11 after being discharged from a recent admission for hypervolemia. He required inotropic support for diuresis and was tolerating GDMT vasodilators and spironolactone, farxiga. He has had episodes of NSVT this admission. He has severe MR and underwent mitral clip x 3 with decompensation and increasing pulmonary edema. Due to increasing oxygen requirements during the case he remained intubated and was transferred to the ICU.   12/11 admitted 12/14 TEE showing EF 20% with global hypokinesis, severely dilated LA, severe functional MR, moderate TR 12/26 mitral clip 12/27 hypotensive overnight. On high dose NE and still on Epi. His Co-ox looks good. CVP 8-12   This NP assessed patient at the bedside as a follow up for palliative medicine needs and emotional support   Has end-stage HF,  now inotrope dependent. Per cardiology may be the last opportunity to consider advanced therapies including VAD. However, this decision is complicated by ongoing drug use and difficult social situation. Has been turned down for VAD due primarily due to limited social support.   Ongoing treatment and evaluation for viable life prolonging treatments.  Support network vital to possibility of advanced therapies.   I was able to speak with his aunt/Cherly outside the room.  Education offered regarding the importance of family support in discussion  and medical decisions for [name].  PMT will continue to support holistically and support patient and his family navigating  ongoing treatment option decisions, advanced directive decisions and anticipatory care needs.  Education offered today regarding  the importance of continued conversation with family and the  medical providers regarding overall plan of care and treatment options,  ensuring decisions are within the context of the patients values and GOCs.  Questions and concerns addressed   PMT will continue to support holistically   35 mniutes   Lorinda Creed NP  Palliative Medicine Team Team Phone # [phone] Pager [phone]

## 2022-11-10 NOTE — Progress Notes (Signed)
eLink Physician-Brief Progress Note Patient Name: Douglas [name] DOB: [birth year] MRN: [id number]   Date of Service  11/10/2022  HPI/Events of Note  Patient with urinary retention, bladder scan positive for 350 ml of urine.  eICU Interventions  In / Out bladder cath ordered.        Thomasene Lot [name] 11/10/2022, 12:35 AM

## 2022-11-11 DIAGNOSIS — N179 Acute kidney failure, unspecified: Secondary | ICD-10-CM | POA: Diagnosis not present

## 2022-11-11 DIAGNOSIS — Z515 Encounter for palliative care: Secondary | ICD-10-CM | POA: Diagnosis not present

## 2022-11-11 DIAGNOSIS — N182 Chronic kidney disease, stage 2 (mild): Secondary | ICD-10-CM | POA: Diagnosis not present

## 2022-11-11 DIAGNOSIS — I340 Nonrheumatic mitral (valve) insufficiency: Secondary | ICD-10-CM | POA: Diagnosis not present

## 2022-11-11 DIAGNOSIS — J810 Acute pulmonary edema: Secondary | ICD-10-CM | POA: Diagnosis not present

## 2022-11-11 DIAGNOSIS — I5023 Acute on chronic systolic (congestive) heart failure: Secondary | ICD-10-CM | POA: Diagnosis not present

## 2022-11-11 LAB — CBC
HCT: 34.7 % — ABNORMAL LOW (ref 39.0–52.0)
Hemoglobin: 11.9 g/dL — ABNORMAL LOW (ref 13.0–17.0)
MCH: 29.2 pg (ref 26.0–34.0)
MCHC: 34.3 g/dL (ref 30.0–36.0)
MCV: 85 fL (ref 80.0–100.0)
Platelets: 163 10*3/uL (ref 150–400)
RBC: 4.08 MIL/uL — ABNORMAL LOW (ref 4.22–5.81)
RDW: 13.3 % (ref 11.5–15.5)
WBC: 14.6 10*3/uL — ABNORMAL HIGH (ref 4.0–10.5)
nRBC: 0 % (ref 0.0–0.2)

## 2022-11-11 LAB — MAGNESIUM: Magnesium: 2 mg/dL (ref 1.7–2.4)

## 2022-11-11 LAB — GLUCOSE, CAPILLARY
Glucose-Capillary: 128 mg/dL — ABNORMAL HIGH (ref 70–99)
Glucose-Capillary: 140 mg/dL — ABNORMAL HIGH (ref 70–99)
Glucose-Capillary: 215 mg/dL — ABNORMAL HIGH (ref 70–99)
Glucose-Capillary: 185 mg/dL — ABNORMAL HIGH (ref 70–99)
Glucose-Capillary: 227 mg/dL — ABNORMAL HIGH (ref 70–99)

## 2022-11-11 LAB — BASIC METABOLIC PANEL
Anion gap: 12 (ref 5–15)
Anion gap: 12 (ref 5–15)
BUN: 50 mg/dL — ABNORMAL HIGH (ref 6–20)
BUN: 48 mg/dL — ABNORMAL HIGH (ref 6–20)
CO2: 19 mmol/L — ABNORMAL LOW (ref 22–32)
CO2: 18 mmol/L — ABNORMAL LOW (ref 22–32)
Calcium: 8.6 mg/dL — ABNORMAL LOW (ref 8.9–10.3)
Calcium: 8.5 mg/dL — ABNORMAL LOW (ref 8.9–10.3)
Chloride: 101 mmol/L (ref 98–111)
Chloride: 99 mmol/L (ref 98–111)
Creatinine, Ser: 3 mg/dL — ABNORMAL HIGH (ref 0.61–1.24)
Creatinine, Ser: 2.79 mg/dL — ABNORMAL HIGH (ref 0.61–1.24)
GFR, Estimated: 25 mL/min — ABNORMAL LOW (ref >60)
GFR, Estimated: 27 mL/min — ABNORMAL LOW (ref >60)
Glucose, Bld: 133 mg/dL — ABNORMAL HIGH (ref 70–99)
Glucose, Bld: 203 mg/dL — ABNORMAL HIGH (ref 70–99)
Potassium: 4.3 mmol/L (ref 3.5–5.1)
Potassium: 3.8 mmol/L (ref 3.5–5.1)
Sodium: 132 mmol/L — ABNORMAL LOW (ref 135–145)
Sodium: 129 mmol/L — ABNORMAL LOW (ref 135–145)

## 2022-11-11 LAB — COOXEMETRY PANEL
Carboxyhemoglobin: 2.4 % — ABNORMAL HIGH (ref 0.5–1.5)
Carboxyhemoglobin: 2.2 % — ABNORMAL HIGH (ref 0.5–1.5)
Methemoglobin: <0.7 % (ref 0.0–1.5)
Methemoglobin: 0.8 % (ref 0.0–1.5)
O2 Saturation: 77.2 %
O2 Saturation: 64.2 %
Total hemoglobin: 12.6 g/dL (ref 12.0–16.0)
Total hemoglobin: 11.9 g/dL — ABNORMAL LOW (ref 12.0–16.0)

## 2022-11-11 LAB — LACTIC ACID, PLASMA
Lactic Acid, Venous: 1.7 mmol/L (ref 0.5–1.9)
Lactic Acid, Venous: 2 mmol/L (ref 0.5–1.9)

## 2022-11-11 LAB — HEPARIN LEVEL (UNFRACTIONATED): Heparin Unfractionated: 0.36 IU/mL (ref 0.30–0.70)

## 2022-11-11 MED ORDER — ASPIRIN 81 MG PO TBEC
81.0000 mg | DELAYED_RELEASE_TABLET | Freq: Every day | ORAL | Status: DC
  Administered 2022-11-12 – 2022-11-18 (×7): 81 mg via ORAL
  Filled 2022-11-11 (×7): qty 1

## 2022-11-11 MED ORDER — AMIODARONE LOAD VIA INFUSION
150.0000 mg | Freq: Once | INTRAVENOUS | Status: AC
  Administered 2022-11-11: 150 mg via INTRAVENOUS
  Filled 2022-11-11: qty 83.34

## 2022-11-11 MED ORDER — HEPARIN (PORCINE) 25000 UT/250ML-% IV SOLN
2350.0000 [IU]/h | INTRAVENOUS | Status: DC
  Administered 2022-11-11: 1100 [IU]/h via INTRAVENOUS
  Administered 2022-11-12: 1350 [IU]/h via INTRAVENOUS
  Administered 2022-11-13: 2200 [IU]/h via INTRAVENOUS
  Administered 2022-11-13: 1750 [IU]/h via INTRAVENOUS
  Administered 2022-11-14: 2300 [IU]/h via INTRAVENOUS
  Administered 2022-11-15 – 2022-11-18 (×8): 2350 [IU]/h via INTRAVENOUS
  Filled 2022-11-11 (×14): qty 250

## 2022-11-11 MED ORDER — FAMOTIDINE 20 MG PO TABS: 20.0000 mg | ORAL_TABLET | Freq: Every day | ORAL | Status: DC

## 2022-11-11 MED ORDER — DOCUSATE SODIUM 100 MG PO CAPS
100.0000 mg | ORAL_CAPSULE | Freq: Two times a day (BID) | ORAL | Status: DC
  Administered 2022-11-11 – 2022-11-19 (×13): 100 mg via ORAL
  Filled 2022-11-11 (×15): qty 1

## 2022-11-11 MED ORDER — INSULIN ASPART 100 UNIT/ML IJ SOLN
0.0000 [IU] | Freq: Every day | INTRAMUSCULAR | Status: DC
  Administered 2022-11-11 – 2022-11-17 (×3): 2 [IU] via SUBCUTANEOUS

## 2022-11-11 MED ORDER — INSULIN ASPART 100 UNIT/ML IJ SOLN
0.0000 [IU] | Freq: Three times a day (TID) | INTRAMUSCULAR | Status: DC
  Administered 2022-11-11: 3 [IU] via SUBCUTANEOUS
  Administered 2022-11-11: 5 [IU] via SUBCUTANEOUS
  Administered 2022-11-12: 3 [IU] via SUBCUTANEOUS
  Administered 2022-11-12: 5 [IU] via SUBCUTANEOUS

## 2022-11-11 MED ORDER — ATORVASTATIN CALCIUM 80 MG PO TABS
80.0000 mg | ORAL_TABLET | Freq: Every day | ORAL | Status: DC
  Administered 2022-11-12 – 2022-11-18 (×7): 80 mg via ORAL
  Filled 2022-11-11 (×7): qty 1

## 2022-11-11 MED ORDER — AMIODARONE IV BOLUS ONLY 150 MG/100ML: 150.0000 mg | Freq: Once | INTRAVENOUS | Status: DC

## 2022-11-11 MED ORDER — FUROSEMIDE 10 MG/ML IJ SOLN
80.0000 mg | Freq: Two times a day (BID) | INTRAMUSCULAR | Status: DC
  Administered 2022-11-11 – 2022-11-15 (×9): 80 mg via INTRAVENOUS
  Filled 2022-11-11 (×9): qty 8

## 2022-11-11 MED ORDER — FUROSEMIDE 10 MG/ML IJ SOLN
80.0000 mg | Freq: Two times a day (BID) | INTRAMUSCULAR | Status: DC
  Administered 2022-11-11: 80 mg via INTRAVENOUS
  Filled 2022-11-11: qty 8

## 2022-11-11 NOTE — Progress Notes (Addendum)
Remains in Afib with rates 120s-130s.   Continue amio gtt at 60/hr  Rebolus with IV amiodarone.   May need to consider DCCV if does not convert to SR.  ADDENDUM 2:30 pm: Called to assess patient d/t chest tightness and dyspnea.   More hypotensive with amiodarone. NE up to 18, remains on milrinone 0.375.  Converted to SR while I was in room.  Continue amio gtt.  CVP 17-18. Give 80 mg lasix IV now. Check CO-OX and lactic acid.

## 2022-11-11 NOTE — Progress Notes (Signed)
Pt declined wearing bipap to sleep tonight

## 2022-11-11 NOTE — Progress Notes (Signed)
ANTICOAGULATION CONSULT NOTE - Initial Consult  Pharmacy Consult for Heparin Indication: atrial fibrillation  Allergies  Allergen Reactions   Bydureon [Exenatide] Rash   Penicillins Other (See Comments)    Childhood allergy Unknown reaction    Patient Measurements: Height: 6\' 4"  (193 cm) Weight: 125.5 kg (276 lb 10.8 oz) IBW/kg (Calculated) : 86.8 Heparin Dosing Weight: 90kg   Vital Signs: Temp: 98.6 F (37 C) (12/28 2015) Temp Source: Oral (12/28 2015) BP: 120/78 (12/28 2015) Pulse Rate: 95 (12/28 2026)  Labs: Recent Labs    11/09/22 2144 11/10/22 0334 11/10/22 0903 11/10/22 1658 11/11/22 0443 11/11/22 1657 11/11/22 1953  HGB 13.6 14.2  --   --  11.9*  --   --   HCT 40.0 40.9  --   --  34.7*  --   --   PLT  --  329  --   --  163  --   --   HEPARINUNFRC  --   --   --   --   --   --  0.36  CREATININE  --  2.60*   < > 2.89* 3.00* 2.79*  --    < > = values in this interval not displayed.     Estimated Creatinine Clearance: 46.9 mL/min (A) (by C-G formula based on SCr of 2.79 mg/dL (H)).   Medical History: Past Medical History:  Diagnosis Date   Depression, major, single episode 01/14/2017   Diabetes mellitus [facility])    ED (erectile dysfunction)    History of syncope 01/29/2015   Hypertension    ICD (implantable cardioverter-defibrillator) discharge 11/30/2014   On 11/30/14. Asymptomatic.    Nonischemic cardiomyopathy (HCC)    a.  echo 4/06: EF 30%, mild to mod MR, mild RAE, inf HK, lat HK , ant HK;    b.  cath 4/06: no CAD, EF 20-25%   NSVT (nonsustained ventricular tachycardia) (HCC)    Obesity    Systolic CHF, chronic (HCC)    EF 11/17 25-30%, s/p ICD      Assessment: 46yom with heart failure with new atrial fibrillation.  Patient has been on enoxaparin fot VTE prophylaxis last dose 12/28 am - this may affect initial heparin level Pharmacy asked to dose heparin drip while in ICU and decisions regarding procedures discussed  Cbc stable no bleeding  noted  HL came back therapeutic tonight. We will cont current rate and check in AM.  Goal of Therapy:  Heparin level 0.3-0.7 units/ml Monitor platelets by anticoagulation protocol: Yes   Plan:  Cont heparin drip 1100 uts/hr  Daily heparin level, CBC Monitor s/s bleeding    1/29, PharmD, BCIDP, AAHIVP, CPP Infectious Disease Pharmacist 11/11/2022 8:45 PM

## 2022-11-11 NOTE — Progress Notes (Signed)
Patient ID: KHAI ARRONA, male   DOB: 1975-11-19, 46 y.o.   MRN: [id number]    Progress Note from the Palliative Medicine Team at [facility]   Patient Name: Douglas [name]        Date: 11/11/2022 DOB: [birth year]  Age: 46 y.o. MRN#: [id number] Attending Physician: Lynnell Catalan, MD Primary Care Physician: Claiborne Rigg, NP Admit Date: 10/25/2022   Medical records reviewed, assessed the patient, discussed with treatment team   46 y/o male with a history of cocaine abuse, former tobacco abuse, NICM, HFrEF who presented with acute decompensated heart failure on 12/11 after being discharged from a recent admission for hypervolemia. He required inotropic support for diuresis and was tolerating GDMT vasodilators and spironolactone, farxiga. He has had episodes of NSVT this admission. He has severe MR and underwent mitral clip x 3 with decompensation and increasing pulmonary edema. Due to increasing oxygen requirements during the case he remained intubated and was transferred to the ICU.   12/11 admitted 12/14 TEE showing EF 20% with global hypokinesis, severely dilated LA, severe functional MR, moderate TR 12/26 mitral clip 12/27 hypotensive overnight. On high dose NE and still on Epi. His Co-ox looks good. CVP 8-12 11-10-22 Successfully extubated     This NP assessed patient at the bedside as a follow up for palliative medicine needs and emotional support.  Patient is alert and oriented, no complaints of discomfort at this time.  Continued education regarding the seriousness of his current medical situation.   Patient verbalizes understanding however today he tells me that he is willing to do what ever it takes "to live"   Has end-stage HF,  now inotrope dependent. Per cardiology may be the last opportunity to consider advanced therapies including VAD. However, this decision is complicated by ongoing drug use and difficult social situation. Concern for LVAD due primarily  to  limited social support.   Ongoing treatment and evaluation for viable life prolonging treatments.  Support network vital to possibility of advanced therapies.   I was able to speak with his sister/  Douglas [name] by telephone today.   Created space and opportunity for [name] to explore thoughts and feelings regarding her brother's current medical situation.    She understands the importance of and need for support for her brother's care into the future.  However at this time she cannot offer housing and her home, working full-time and having several adult children who also live in that home.  She is willing to help however she can,  however she too has her own health problems and again works full-time.   PMT will continue to support holistically and support patient and his family navigating  ongoing treatment option decisions, advanced directive decisions and anticipatory care needs.  Education offered today regarding  the importance of continued conversation with family and the  medical providers regarding overall plan of care and treatment options,  ensuring decisions are within the context of the patients values and GOCs.  Questions and concerns addressed   PMT will continue to support holistically   35  minutes   Lorinda Creed NP  Palliative Medicine Team Team Phone # [phone] Pager [phone]

## 2022-11-11 NOTE — Progress Notes (Signed)
NAME:  Douglas [name], MRN:  [id number], DOB:  Jul 03, 1976, LOS: 15 ADMISSION DATE:  10/25/2022, CONSULTATION DATE:  12/26 REFERRING MD:  Cardiology - Bensimhon, CHIEF COMPLAINT:  respiratory failure   History of Present Illness:  Douglas [name] is a 46 y/o gentleman with a history of cocaine abuse, former tobacco abuse, NICM, HFrEF who presented with acute decompensated heart failure on 12/11 after being discharged from a recent admission for hypervolemia. He required inotropic support for diuresis and was tolerating GDMT vasodilators and spironolactone, farxiga. He has had episodes of NSVT this admission. He has severe MR and underwent mitral clip x 3 with decompensation and increasing pulmonary edema.  Due to increasing oxygen requirements during the case he remained intubated and was transferred to the ICU.  PCCM was consulted for critical care management.  Pertinent  Medical History  Cocaine abuse HFrEF, NICM HTN DM NSVT Tobacco abuse CKD 3b  Significant Hospital Events: Including procedures, antibiotic start and stop dates in addition to other pertinent events   12/11 admitted 12/14 TEE showing EF 20% with global hypokinesis, severely dilated LA, severe functional MR, moderate TR 12/26 mitral clip 12/27 hypotensive overnight. On high dose NE and still on Epi. His Co-ox looks good. CVP 8-12, extubated. 12/28 renal function a little worse, respiratory status improving.  Weaning pressors Co. oximetry at goal  Interim History / Subjective:  No distress  Objective   Blood pressure (!) 122/97, pulse (!) 105, temperature 98.3 F (36.8 C), temperature source Oral, resp. rate 18, height 6\' 4"  (1.93 m), weight 122.5 kg, SpO2 94 %. CVP:  [8 mmHg-16 mmHg] 16 mmHg  Vent Mode: PRVC FiO2 (%):  [100 %] 100 % Set Rate:  [18 bmp] 18 bmp Vt Set:  [690 mL] 690 mL PEEP:  [10 cmH20] 10 cmH20 Plateau Pressure:  [27 cmH20] 27 cmH20   Intake/Output Summary (Last 24 hours) at 11/09/2022  1824 Last data filed at 11/09/2022 1728 Gross per 24 hour  Intake 1947.07 ml  Output 580 ml  Net 1367.07 ml   Filed Weights   11/06/22 0426 11/07/22 0300 11/08/22 0410  Weight: 122.5 kg 123 kg 122.5 kg    Examination:  General 46 year old male patient resting in bed he is currently in no acute distress HEENT normocephalic jugular venous distention Pulmonary: Diminished bases no accessory use currently on 4 L cannula Cardiac: Regular rate and rhythm Abdomen: Soft nontender Extremities: Warm dry brisk cap refill trace lower extremity edema Neuro: Awake oriented no focal deficits.  Resolved Hospital Problem list     Assessment & Plan:  Acute on chronic HfrEF Severe MR, s/p mitral clip  NSVT Plan Holding goal-directed medical therapy until he is off norepinephrine Continue milrinone Wean norepinephrine for mean arterial pressure greater than 65 Continue amiodarone Currently holding hydralazine and Imdur Daily assessment for diuresis, I am going to hold off from diuresis today  Acute respiratory failure with hypoxia Acute pulmonary edema Plan Mobilize Chest x-ray today Incentive spirometry Wean oxygen  HLD Plan Continues statin   Acute on chronic renal failure (CKD IIIb) w/ urinary retention and rising sCr -Serum creatinine continues to rise, I suspect this is multifactorial Plan Renal dose medications Keep euvolemic Keep Foley for 24 more hours Hold diuresis today Strict intake output A.m. chemistry   Mild leukocytosis Plan Slowly rising, will continue to trend   Qtc prolongation d/c'd  QT prolonging meds (pepcid) Current qtc 497 Plan Avoid QTc prolonging agents Will place order for twelve-lead in morning  Mediastinal  adenopathy GGO on CT Plan repeat imaging after 6 months  DM with uncontrolled hyperglycemia;  Plan Sliding scale insulin goal 140-180 Continue Farxiga  Continue insulin glargine at 17 units daily   Best Practice (right click  and "Reselect all SmartList Selections" daily)   Diet/type: Regular consistency (see orders) DVT prophylaxis: LMWH GI prophylaxis: PPI Lines: Central line and Arterial Line Foley:  Yes, and it is still needed Code Status:  full code Last date of multidisciplinary goals of care discussion [  ]  My cct 32 min  Simonne Martinet ACNP-BC [facility] Pulmonary/Critical Care Pager # [phone] OR # [phone] if no answer

## 2022-11-11 NOTE — Progress Notes (Signed)
PT Cancellation Note  Patient Details Name: Douglas [name] MRN: [id number] DOB: Jan 20, 1976   Cancelled Treatment:    Reason Eval/Treat Not Completed: Medical issues which prohibited therapy, noted tachycardia and hypotension, to hold on PT Evaluation; will check back this afternoon as schedule permits.  Ina Homes, PT, DPT Acute Rehabilitation Services  Personal: Secure Chat Rehab Office: [phone]  Malachy Chamber 11/11/2022, 11:05 AM

## 2022-11-11 NOTE — Progress Notes (Signed)
Brief MCS rounding note:   Per Dr [name] will table VAD discussion at this time due to ATN with Creatinine 3.0 this morning. VAD team will continue to follow along for appropriateness of VAD evaluation in the future if Creatinine stabilizes.   Spoke with pt's brother Mena Goes and canceled meeting scheduled tomorrow afternoon. Made aware our team will reach back out if VAD becomes an option to discuss caregiver role/capability. He verbalized understanding.  Alyce Pagan RN VAD Coordinator  Office: [phone]  24/7 Pager: [phone]

## 2022-11-11 NOTE — Progress Notes (Signed)
ANTICOAGULATION CONSULT NOTE - Initial Consult  Pharmacy Consult for Heparin Indication: atrial fibrillation  Allergies  Allergen Reactions   Bydureon [Exenatide] Rash   Penicillins Other (See Comments)    Childhood allergy Unknown reaction    Patient Measurements: Height: 6\' 4"  (193 cm) Weight: 125.5 kg (276 lb 10.8 oz) IBW/kg (Calculated) : 86.8 Heparin Dosing Weight: 90kg   Vital Signs: Temp: 99 F (37.2 C) (12/28 0830) Temp Source: Axillary (12/28 0830) BP: 110/72 (12/28 0200) Pulse Rate: 106 (12/28 0900)  Labs: Recent Labs    11/09/22 2144 11/10/22 0334 11/10/22 0903 11/10/22 1658 11/11/22 0443  HGB 13.6 14.2  --   --  11.9*  HCT 40.0 40.9  --   --  34.7*  PLT  --  329  --   --  163  CREATININE  --  2.60* 2.74* 2.89* 3.00*    Estimated Creatinine Clearance: 43.6 mL/min (A) (by C-G formula based on SCr of 3 mg/dL (H)).   Medical History: Past Medical History:  Diagnosis Date   Depression, major, single episode 01/14/2017   Diabetes mellitus [facility])    ED (erectile dysfunction)    History of syncope 01/29/2015   Hypertension    ICD (implantable cardioverter-defibrillator) discharge 11/30/2014   On 11/30/14. Asymptomatic.    Nonischemic cardiomyopathy (HCC)    a.  echo 4/06: EF 30%, mild to mod MR, mild RAE, inf HK, lat HK , ant HK;    b.  cath 4/06: no CAD, EF 20-25%   NSVT (nonsustained ventricular tachycardia) (HCC)    Obesity    Systolic CHF, chronic (HCC)    EF 11/17 25-30%, s/p ICD      Assessment: 46yom with heart failure with new atrial fibrillation.  Patient has been on enoxaparin fot VTE prophylaxis last dose 12/28 am - this may affect initial heparin level Pharmacy asked to dose heparin drip while in ICU and decisions regarding procedures discussed  Cbc stable no bleeding noted  Goal of Therapy:  Heparin level 0.3-0.7 units/ml Monitor platelets by anticoagulation protocol: Yes   Plan:  Stop enoxaparin No bolus Start heparin drip 1100  uts/hr  Draw heparin level 6hr after start Daily heparin level, CBC Monitor s/s bleeding    1/29 Pharm.D. CPP, BCPS Clinical Pharmacist [phone] 11/11/2022 11:58 AM

## 2022-11-11 NOTE — Progress Notes (Signed)
Spoke with [employer], [employer] rep. Patients ICD was turned back on night of mTEER, 12/16.   Brynda Peon, AGACNP-BC  Advanced Heart Failure Team  11/11/22 4:14 PM

## 2022-11-11 NOTE — Progress Notes (Addendum)
Advanced Heart Failure Rounding Note  PCP-Cardiologist: None   Subjective:    12/26: Unsuccessful mTEER with severe residual MR d/t chordal disruption, 2 XTW clips being placed at A2/P2 position. Unable to place 3rd clip. 12/27: Extubated  CO-OX 77%  Currently on 0.25 milrinone + NE down to 10. Off Epi  CVP 12-13.  Scr continues to trend up, 2.2>2.6>2.9>3.0   Objective:   Weight Range: 125.5 kg Body mass index is 33.68 kg/m.   Vital Signs:   Temp:  [97.7 F (36.5 C)-99 F (37.2 C)] 99 F (37.2 C) (12/28 0830) Pulse Rate:  [78-100] 99 (12/28 0809) Resp:  [10-28] 22 (12/28 0809) BP: (110)/(72) 110/72 (12/28 0200) SpO2:  [91 %-98 %] 95 % (12/28 0812) Arterial Line BP: (68-140)/(44-70) 106/62 (12/28 0700) FiO2 (%):  [36 %-40 %] 36 % (12/28 0812) Weight:  [125.5 kg] 125.5 kg (12/28 0500) Last BM Date : 11/07/22  Weight change: Filed Weights   11/08/22 0410 11/10/22 0500 11/11/22 0500  Weight: 122.5 kg 125.6 kg 125.5 kg    Intake/Output:   Intake/Output Summary (Last 24 hours) at 11/11/2022 0903 Last data filed at 11/11/2022 0400 Gross per 24 hour  Intake 2022.07 ml  Output 1265 ml  Net 757.07 ml     Physical Exam  General:  Sitting up in bed. No distress.  HEENT: normal Neck: supple. JVP 12-14. Carotids 2+ bilat; no bruits.  Cor: PMI nondisplaced. Regular rate & rhythm. No rubs, gallops, 3/6 MR murmur. Lungs: clear Abdomen: soft, nontender, nondistended.  Extremities: no cyanosis, clubbing, rash, trace edema Neuro: alert & orientedx3. Affect pleasant.    Telemetry    Sinus 90s, brief runs NSVT, PVCs (personally reviewed)   Labs    CBC Recent Labs    11/10/22 0334 11/11/22 0443  WBC 12.5* 14.6*  HGB 14.2 11.9*  HCT 40.9 34.7*  MCV 84.5 85.0  PLT 329 163   Basic Metabolic Panel Recent Labs    78/46/96 0334 11/10/22 0903 11/10/22 1658 11/11/22 0443  NA 134*   < > 136 132*  K 4.9   < > 4.2 4.3  CL 104   < > 106 101  CO2 20*   <  > 19* 19*  GLUCOSE 199*   < > 147* 133*  BUN 39*   < > 45* 50*  CREATININE 2.60*   < > 2.89* 3.00*  CALCIUM 9.1   < > 8.1* 8.6*  MG 2.2  --   --  2.0  PHOS 6.5*  --   --   --    < > = values in this interval not displayed.   Liver Function Tests No results for input(s): "AST", "ALT", "ALKPHOS", "BILITOT", "PROT", "ALBUMIN" in the last 72 hours.  No results for input(s): "LIPASE", "AMYLASE" in the last 72 hours. Cardiac Enzymes No results for input(s): "CKTOTAL", "CKMB", "CKMBINDEX", "TROPONINI" in the last 72 hours.  BNP: BNP (last 3 results) Recent Labs    10/18/22 1821 10/25/22 1442 10/28/22 1825  BNP 1,494.7* 1,428.2* 491.6*    ProBNP (last 3 results) No results for input(s): "PROBNP" in the last 8760 hours.   D-Dimer No results for input(s): "DDIMER" in the last 72 hours. Hemoglobin A1C No results for input(s): "HGBA1C" in the last 72 hours.  Fasting Lipid Panel Recent Labs    11/10/22 0334  TRIG 66   Thyroid Function Tests No results for input(s): "TSH", "T4TOTAL", "T3FREE", "THYROIDAB" in the last 72 hours.  Invalid input(s): "FREET3"  Other results:   Imaging    DG Chest Port 1 View  Result Date: 11/10/2022 CLINICAL DATA:  Short of breath, pulmonary edema EXAM: PORTABLE CHEST 1 VIEW COMPARISON:  Chest 11/09/2022 FINDINGS: Endotracheal tube in good position. NG tube in the stomach. Right arm PICC tip in the SVC. AICD unchanged in position with lead in the right ventricle. Progression of diffuse bilateral airspace disease, left greater than right. Probable pulmonary edema. No significant pleural effusion. IMPRESSION: Progression of diffuse bilateral airspace disease, left greater than right. Probable pulmonary edema. Electronically Signed   By: Marlan Palau M.D.   On: 11/10/2022 09:30     Medications:     Scheduled Medications:  albuterol  2.5 mg Nebulization Once   arformoterol  15 mcg Nebulization BID   aspirin  81 mg Per Tube Daily    atorvastatin  80 mg Per Tube Daily   Chlorhexidine Gluconate Cloth  6 each Topical Daily   docusate sodium  100 mg Oral BID   enoxaparin (LOVENOX) injection  60 mg Subcutaneous Q24H   famotidine  20 mg Per Tube Daily   furosemide  80 mg Intravenous BID   insulin glargine-yfgn  17 Units Subcutaneous Daily   revefenacin  175 mcg Nebulization Daily   sodium chloride flush  10-40 mL Intracatheter Q12H   sodium chloride flush  3 mL Intravenous Q12H   sodium chloride flush  3 mL Intravenous Q12H   sodium chloride flush  3 mL Intravenous Q12H    Infusions:  sodium chloride     sodium chloride     amiodarone 30 mg/hr (11/11/22 0000)   milrinone 0.375 mcg/kg/min (11/11/22 0849)   norepinephrine (LEVOPHED) Adult infusion 18 mcg/min (11/11/22 0000)    PRN Medications: sodium chloride, sodium chloride, acetaminophen, albuterol, ALPRAZolam, melatonin, mouth rinse, prochlorperazine, sodium chloride flush, sodium chloride flush, sodium chloride flush    Patient Profile   Mr. Grantham is a 46 year old with a history of HTN, hyperlipidemia, smoker, cocaine abuse,NSVT,  NICM, Medtronic ICD, and chronic HFrEF. Last used cocaine about a week ago.  EF has been down for some time.    Admitted recently with A/C HFrEF. Suspect he had residual congestion when discharged.  Readmitted 12/11 with A/C HFrEF.  Assessment/Plan   1. A/C HFrEF  - NICM. Has Medtronic ICD. 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   - Admit early December. Suspect still had volume on board at discharge + increased fluid intake leading to readmission several days later. - NYHA IV on admit - TEE this admit EF 20%, severe central MR - RHC on 11/05/22 RA 2 PCW 33 (v = 48) CI ~2.0  - s/p unsuccessful MitraClip secondary to severe residual MR with torn lateral chordae tendonae - Echo 12/27: EF 20-25%, RV moderately reduced, moderate to severe residual MR - CO-OX 77%.  - Currently on milrinone 0.25, NE down to 10. Increase  milrinone to 0.375 to help with renal perfusion. Need improvement in renal function before moving towards VAD. May need additional support with IABP. - CVP 12-13. Give 80 mg lasix IV BID. 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 This may be the last opportunity to consider advanced therapies including VAD. However, this decision is complicated by ongoing drug use and difficult social situation. Has been turned down for VAD due primarily due to limited social support. Planning to reevaluate options with the team now that he had  unsuccessful MitraClip. VAD coordinator meeting with brother today  2. AKI on CKD Stage IIIb - Creatinine on admit 2. Baseline previously ~ 1.6.  - Cr 1.7 >> 2.60 >> 3.0 - ? D/t episodes of hypotension +/- low-output - Diuretics as above. - Continue inotrope support   3. Substance Abuse - Last used cocaine about a week PTA. - Discussed cessation.  - He is motivated to quit  4. CAD - LHC 02/2022 nonobstructive CAD - LDL 124  - Continue atorvastatin    5. DMII -On SSI -Holding Farxiga for now   6. HTN  - Stable    7. NSVT - Previously on amio but he stopped in April of this year.  I am not sure why he stopped.  - Frequent burst of NSVT noted on device since the beginning of December.  - Now on IV amio d/t frequent runs NSVT.  - Continue IV amio while on inotrope support - Short runs NSVT on tele - Keep K > 4.0 Mg > 2.0  - Off Coreg with low-output   8. Obesity  - Body mass index is 33.68 kg/m.   9. MR  - TEE 12/14: EF 20%, Severe central MR - S/p unsuccessful mTEER 12/26 as above with residual severe MR  10. Mediastinal lymph nodes, GGO RUL - Noted on preVAD CT - Will need repeat imaging in 3-6 months to follow  11. Urinary retention - Now has foley  SDOH: -Has medicaid -Works part-time at [employer].  -SW and VAD coordinators evaluated patient. Currently living with Aunt  and sleeps on the floor. Would need more stable living situation prior to VAD. -Has been turned down for VAD due primarily due to limited social support.   ADDENDUM: Went into AF with RVR around 10 am. Bolus with IV amio and increase rate to 60/hr. Will need to anticoagulate.  Length of Stay: 17  FINCH, LINDSAY N, PA-C  9:03 AM  Advanced Heart Failure Team Pager [phone] (M-F; 7a - 5p)  Please contact [facility] for night-coverage after hours (5p -7a ) and weekends on [email or web address]   Agree with above. Weaning NE slowly. Epi off. Remains on NE 10 and  milrinone 0.25. Feels ok. Denies dyspnea. CVP 13.   In new AF with RVR today. IV amio started   Scr beginning to level off.   General:  Sitting up in bed . No resp difficulty HEENT: normal Neck: supple. JVP to jaw . Carotids 2+ bilat; no bruits. No lymphadenopathy or thryomegaly appreciated. Cor: PMI nondisplaced. Irregular rate & rhythm. 2/6 MR in axilla Lungs: clear Abdomen: soft, nontender, nondistended. No hepatosplenomegaly. No bruits or masses. Good bowel sounds. Extremities: no cyanosis, clubbing, rash, edema Neuro: alert & orientedx3, cranial nerves grossly intact. moves all 4 extremities w/o difficulty. Affect pleasant  Remains very tenuous but only mild symptoms. Wean NE as tolerated. Increase milrinone. Currently not candidate for VAD with AKI but will continue to follow. If Scr returns to baseline will revisit potential VAD candidacy.   Need to get back in NSR. Bolus amio. Continue amio gtt. Start heparin.  D/w VAD team personally.   CRITICAL CARE Performed by: [name]  Total critical care time: 45 minutes  Critical care time was exclusive of separately billable procedures and treating other patients.  Critical care was necessary to treat or prevent imminent or life-threatening deterioration.  Critical care was time spent personally by me (independent of midlevel providers or residents) on the  following activities: development of treatment  plan with patient and/or surrogate as well as nursing, discussions with consultants, evaluation of patient's response to treatment, examination of patient, obtaining history from patient or surrogate, ordering and performing treatments and interventions, ordering and review of laboratory studies, ordering and review of radiographic studies, pulse oximetry and re-evaluation of patient's condition.  [name], MD  6:41 PM

## 2022-11-12 ENCOUNTER — Inpatient Hospital Stay (HOSPITAL_COMMUNITY): Payer: Medicaid Other

## 2022-11-12 DIAGNOSIS — I340 Nonrheumatic mitral (valve) insufficiency: Secondary | ICD-10-CM | POA: Diagnosis not present

## 2022-11-12 DIAGNOSIS — I5023 Acute on chronic systolic (congestive) heart failure: Secondary | ICD-10-CM | POA: Diagnosis not present

## 2022-11-12 DIAGNOSIS — J810 Acute pulmonary edema: Secondary | ICD-10-CM | POA: Diagnosis not present

## 2022-11-12 LAB — BASIC METABOLIC PANEL
Anion gap: 13 (ref 5–15)
Anion gap: 11 (ref 5–15)
BUN: 48 mg/dL — ABNORMAL HIGH (ref 6–20)
BUN: 48 mg/dL — ABNORMAL HIGH (ref 6–20)
CO2: 19 mmol/L — ABNORMAL LOW (ref 22–32)
CO2: 18 mmol/L — ABNORMAL LOW (ref 22–32)
Calcium: 8.6 mg/dL — ABNORMAL LOW (ref 8.9–10.3)
Calcium: 8.2 mg/dL — ABNORMAL LOW (ref 8.9–10.3)
Chloride: 99 mmol/L (ref 98–111)
Chloride: 102 mmol/L (ref 98–111)
Creatinine, Ser: 2.61 mg/dL — ABNORMAL HIGH (ref 0.61–1.24)
Creatinine, Ser: 2.42 mg/dL — ABNORMAL HIGH (ref 0.61–1.24)
GFR, Estimated: 29 mL/min — ABNORMAL LOW (ref >60)
GFR, Estimated: 32 mL/min — ABNORMAL LOW (ref >60)
Glucose, Bld: 173 mg/dL — ABNORMAL HIGH (ref 70–99)
Glucose, Bld: 215 mg/dL — ABNORMAL HIGH (ref 70–99)
Potassium: 3.8 mmol/L (ref 3.5–5.1)
Potassium: 4.2 mmol/L (ref 3.5–5.1)
Sodium: 131 mmol/L — ABNORMAL LOW (ref 135–145)
Sodium: 131 mmol/L — ABNORMAL LOW (ref 135–145)

## 2022-11-12 LAB — COOXEMETRY PANEL
Carboxyhemoglobin: 2.6 % — ABNORMAL HIGH (ref 0.5–1.5)
Carboxyhemoglobin: 0.9 % (ref 0.5–1.5)
Methemoglobin: <0.7 % (ref 0.0–1.5)
Methemoglobin: <0.7 % (ref 0.0–1.5)
O2 Saturation: 73.8 %
O2 Saturation: 57.6 %
Total hemoglobin: 11.5 g/dL — ABNORMAL LOW (ref 12.0–16.0)
Total hemoglobin: 11.5 g/dL — ABNORMAL LOW (ref 12.0–16.0)

## 2022-11-12 LAB — CBC
HCT: 32.2 % — ABNORMAL LOW (ref 39.0–52.0)
Hemoglobin: 11.3 g/dL — ABNORMAL LOW (ref 13.0–17.0)
MCH: 28.9 pg (ref 26.0–34.0)
MCHC: 35.1 g/dL (ref 30.0–36.0)
MCV: 82.4 fL (ref 80.0–100.0)
Platelets: 195 10*3/uL (ref 150–400)
RBC: 3.91 MIL/uL — ABNORMAL LOW (ref 4.22–5.81)
RDW: 13.4 % (ref 11.5–15.5)
WBC: 11 10*3/uL — ABNORMAL HIGH (ref 4.0–10.5)
nRBC: 0 % (ref 0.0–0.2)

## 2022-11-12 LAB — GLUCOSE, CAPILLARY
Glucose-Capillary: 190 mg/dL — ABNORMAL HIGH (ref 70–99)
Glucose-Capillary: 203 mg/dL — ABNORMAL HIGH (ref 70–99)
Glucose-Capillary: 266 mg/dL — ABNORMAL HIGH (ref 70–99)
Glucose-Capillary: 137 mg/dL — ABNORMAL HIGH (ref 70–99)

## 2022-11-12 LAB — HEPARIN LEVEL (UNFRACTIONATED)
Heparin Unfractionated: 0.22 IU/mL — ABNORMAL LOW (ref 0.30–0.70)
Heparin Unfractionated: 0.17 IU/mL — ABNORMAL LOW (ref 0.30–0.70)
Heparin Unfractionated: 0.21 IU/mL — ABNORMAL LOW (ref 0.30–0.70)

## 2022-11-12 LAB — MAGNESIUM: Magnesium: 2 mg/dL (ref 1.7–2.4)

## 2022-11-12 MED ORDER — POTASSIUM CHLORIDE CRYS ER 20 MEQ PO TBCR: 20.0000 meq | EXTENDED_RELEASE_TABLET | Freq: Once | ORAL | Status: DC

## 2022-11-12 MED ORDER — ADULT MULTIVITAMIN W/MINERALS CH
1.0000 | ORAL_TABLET | Freq: Every day | ORAL | Status: DC
  Administered 2022-11-12 – 2022-11-18 (×7): 1 via ORAL
  Filled 2022-11-12 (×7): qty 1

## 2022-11-12 MED ORDER — POTASSIUM CHLORIDE CRYS ER 20 MEQ PO TBCR
40.0000 meq | EXTENDED_RELEASE_TABLET | Freq: Once | ORAL | Status: AC
  Administered 2022-11-12: 40 meq via ORAL
  Filled 2022-11-12: qty 2

## 2022-11-12 MED ORDER — INSULIN GLARGINE-YFGN 100 UNIT/ML ~~LOC~~ SOLN
20.0000 [IU] | Freq: Every day | SUBCUTANEOUS | Status: DC
  Administered 2022-11-13 – 2022-11-18 (×6): 20 [IU] via SUBCUTANEOUS
  Filled 2022-11-12 (×6): qty 0.2

## 2022-11-12 MED ORDER — GUAIFENESIN 100 MG/5ML PO LIQD
5.0000 mL | ORAL | Status: DC | PRN
  Administered 2022-11-12 – 2022-11-19 (×17): 5 mL via ORAL
  Filled 2022-11-12 (×17): qty 5

## 2022-11-12 MED ORDER — INSULIN ASPART 100 UNIT/ML IJ SOLN
0.0000 [IU] | Freq: Three times a day (TID) | INTRAMUSCULAR | Status: DC
  Administered 2022-11-12: 11 [IU] via SUBCUTANEOUS
  Administered 2022-11-13: 3 [IU] via SUBCUTANEOUS
  Administered 2022-11-13 – 2022-11-14 (×3): 4 [IU] via SUBCUTANEOUS
  Administered 2022-11-14: 3 [IU] via SUBCUTANEOUS
  Administered 2022-11-14 – 2022-11-15 (×3): 4 [IU] via SUBCUTANEOUS
  Administered 2022-11-15 – 2022-11-18 (×6): 3 [IU] via SUBCUTANEOUS

## 2022-11-12 NOTE — Progress Notes (Signed)
ANTICOAGULATION CONSULT NOTE  Pharmacy Consult for Heparin Indication: atrial fibrillation  Allergies  Allergen Reactions   Bydureon [Exenatide] Rash   Penicillins Other (See Comments)    Childhood allergy Unknown reaction    Patient Measurements: Height: 6\' 4"  (193 cm) Weight: 128 kg (282 lb 3 oz) IBW/kg (Calculated) : 86.8 Heparin Dosing Weight: 90kg   Vital Signs: Temp: 98.5 F (36.9 C) (12/29 2000) Temp Source: Oral (12/29 2000) BP: 107/70 (12/29 2100) Pulse Rate: 88 (12/29 2100)  Labs: Recent Labs    11/10/22 0334 11/10/22 0903 11/11/22 0443 11/11/22 1657 11/11/22 1953 11/12/22 0356 11/12/22 1150 11/12/22 1650 11/12/22 2116  HGB 14.2  --  11.9*  --   --  11.3*  --   --   --   HCT 40.9  --  34.7*  --   --  32.2*  --   --   --   PLT 329  --  163  --   --  195  --   --   --   HEPARINUNFRC  --   --   --   --    < > 0.22* 0.17*  --  0.21*  CREATININE 2.60*   < > 3.00* 2.79*  --  2.61*  --  2.42*  --    < > = values in this interval not displayed.     Estimated Creatinine Clearance: 54.5 mL/min (A) (by C-G formula based on SCr of 2.42 mg/dL (H)).   Medical History: Past Medical History:  Diagnosis Date   Depression, major, single episode 01/14/2017   Diabetes mellitus [facility])    ED (erectile dysfunction)    History of syncope 01/29/2015   Hypertension    ICD (implantable cardioverter-defibrillator) discharge 11/30/2014   On 11/30/14. Asymptomatic.    Nonischemic cardiomyopathy (HCC)    a.  echo 4/06: EF 30%, mild to mod MR, mild RAE, inf HK, lat HK , ant HK;    b.  cath 4/06: no CAD, EF 20-25%   NSVT (nonsustained ventricular tachycardia) (HCC)    Obesity    Systolic CHF, chronic (HCC)    EF 11/17 25-30%, s/p ICD      Assessment: Douglas [name] with heart failure with new atrial fibrillation. Pharmacy asked to dose heparin drip while in ICU and decisions regarding procedures discussed.  Heparin level came back subtherapeutic at 0.17, on 1350 units/hr. Hgb 11.3,  plt 195 - stable. No s/sx of bleeding or infusion issues. Scr continues to improve - now down to 2.61.   Heparin level increase but still subtherapeutic. No bleeding per Rn. We will increase and check level again in AM.   Goal of Therapy:  Heparin level 0.3-0.7 units/ml Monitor platelets by anticoagulation protocol: Yes   Plan:  Increase heparin infusion to 1750 uts/hr  Order heparin level in 8 hours  Daily heparin level, CBC Monitor s/s bleeding   12/17, PharmD, BCIDP, AAHIVP, CPP Infectious Disease Pharmacist 11/12/2022 10:10 PM

## 2022-11-12 NOTE — Progress Notes (Signed)
ANTICOAGULATION CONSULT NOTE  Pharmacy Consult for Heparin Indication: atrial fibrillation Brief A/P: Heparin level subtherapeutic Increase Heparin rate  Allergies  Allergen Reactions   Bydureon [Exenatide] Rash   Penicillins Other (See Comments)    Childhood allergy Unknown reaction    Patient Measurements: Height: 6\' 4"  (193 cm) Weight: 125.5 kg (276 lb 10.8 oz) IBW/kg (Calculated) : 86.8 Heparin Dosing Weight: 90kg   Vital Signs: Temp: 98.5 F (36.9 C) (12/29 0335) Temp Source: Axillary (12/29 0335) BP: 115/61 (12/29 0400) Pulse Rate: 71 (12/29 0400)  Labs: Recent Labs    11/10/22 0334 11/10/22 0903 11/11/22 0443 11/11/22 1657 11/11/22 1953 11/12/22 0356  HGB 14.2  --  11.9*  --   --  11.3*  HCT 40.9  --  34.7*  --   --  32.2*  PLT 329  --  163  --   --  195  HEPARINUNFRC  --   --   --   --  0.36 0.22*  CREATININE 2.60*   < > 3.00* 2.79*  --  2.61*   < > = values in this interval not displayed.     Estimated Creatinine Clearance: 50.1 mL/min (A) (by C-G formula based on SCr of 2.61 mg/dL (H)).   Assessment: 46 y.o. male with Afib for heparin  Goal of Therapy:  Heparin level 0.3-0.7 units/ml Monitor platelets by anticoagulation protocol: Yes   Plan:  Increase Heparin 1350 units/hr Check heparin level in 8 hours.   52, PharmD, BCPS

## 2022-11-12 NOTE — TOC CM/SW Note (Signed)
HF TOC CM spoke to pt's Douglas [name], states she has discussed with pt possibly living with her in the home, if he is unable to stay with his brother. She provided his FMLA and disability paperwork. Took paperwork to [facility] for HF RN, Herbert Seta to review and complete. Isidoro Donning RN3 CCM, Heart Failure TOC CM [phone]

## 2022-11-12 NOTE — Evaluation (Signed)
Physical Therapy Evaluation Patient Details Name: Douglas [name] MRN: [id number] DOB: 1975-12-11 Today's Date: 11/12/2022  History of Present Illness  Pt is a 46 y.o. male admitted 10/25/22 with acute decompensated HF. TEE 12/14 with severe MR. S/p unsuccessful mTEER 12/26; post-op decompensation and increasing pulmonary edema requiring transfer to ICU. ETT 12/26-12/27. VAD team following due to failed Mitraclip. PMH includes CHF, DM2, HTN, NSVT, CKD, obesity, depression, anxiety, cocaine use, tobacco use.   Clinical Impression  Pt admitted with above diagnosis. PTA pt lived in first floor apartment with his aunt, independent mobility/ADLs working part time. Pt currently with functional limitations due to the deficits listed below (see PT Problem List). On eval, pt required mod assist bed mobility, min assist sit to stand, and min assist amb 225' with eva walker. Pt on 4L continuous O2 with VSS. NT provided +2 assist for safety, line management, and chair follow. Pt will benefit from skilled PT to increase their independence and safety with mobility to allow discharge home. Suspect pt will progress quickly with mobility.           Recommendations for follow up therapy are one component of a multi-disciplinary discharge planning process, led by the attending physician.  Recommendations may be updated based on patient status, additional functional criteria and insurance authorization.  Follow Up Recommendations Outpatient PT (vs cardiac rehab)      Assistance Recommended at Discharge PRN  Patient can return home with the following  Assistance with cooking/housework;Assist for transportation    Equipment Recommendations Other (comment) (TBD pending progress)  Recommendations for Other Services       Functional Status Assessment Patient has had a recent decline in their functional status and demonstrates the ability to make significant improvements in function in a reasonable and  predictable amount of time.     Precautions / Restrictions Precautions Precautions: Fall;Other (comment) Precaution Comments: watch vitals, a line L wrist Restrictions Weight Bearing Restrictions: No      Mobility  Bed Mobility Overal bed mobility: Needs Assistance Bed Mobility: Sit to Supine       Sit to supine: Mod assist, HOB elevated, +2 for safety/equipment   General bed mobility comments: +rail, increased time, assist with trunk and BLE    Transfers Overall transfer level: Needs assistance Equipment used: Bilateral platform walker (eva walker) Transfers: Sit to/from Stand Sit to Stand: +2 safety/equipment, Min assist           General transfer comment: cues for hand placement and sequencing, assist to power up    Ambulation/Gait Ambulation/Gait assistance: Min assist, +2 safety/equipment Gait Distance (Feet): 225 Feet Assistive device: Fara Boros Gait Pattern/deviations: Step-through pattern, Decreased stride length Gait velocity: WFL Gait velocity interpretation: >2.62 ft/sec, indicative of community ambulatory   General Gait Details: Amb on 4L. Assist to stabilize balance and manage eva walker. NT provided chair follow but no seated rest breaks needed.  Stairs            Wheelchair Mobility    Modified Rankin (Stroke Patients Only)       Balance Overall balance assessment: Needs assistance Sitting-balance support: No upper extremity supported, Feet supported Sitting balance-Leahy Scale: Good     Standing balance support: No upper extremity supported, Bilateral upper extremity supported, During functional activity Standing balance-Leahy Scale: Fair Standing balance comment: static stand without UE support, eva walker for amb  Pertinent Vitals/Pain Pain Assessment Pain Assessment: No/denies pain    Home Living Family/patient expects to be discharged to:: Private residence Living Arrangements:  Other relatives (aunt) Available Help at Discharge: Family;Available 24 hours/day Type of Home: Apartment Home Access: Level entry       Home Layout: One level Home Equipment: None      Prior Function Prior Level of Function : Independent/Modified Independent;Working/employed             Mobility Comments: works part time at [employer]        Extremity/Trunk Assessment   Upper Extremity Assessment Upper Extremity Assessment: Generalized weakness    Lower Extremity Assessment Lower Extremity Assessment: Generalized weakness    Cervical / Trunk Assessment Cervical / Trunk Assessment: Normal  Communication   Communication: No difficulties  Cognition Arousal/Alertness: Awake/alert Behavior During Therapy: WFL for tasks assessed/performed Overall Cognitive Status: Within Functional Limits for tasks assessed                                          General Comments General comments (skin integrity, edema, etc.): VSS on 4L    Exercises     Assessment/Plan    PT Assessment Patient needs continued PT services  PT Problem List Decreased strength;Decreased balance;Decreased mobility;Decreased knowledge of use of DME;Cardiopulmonary status limiting activity;Decreased activity tolerance       PT Treatment Interventions Gait training;DME instruction;Functional mobility training;Balance training;Patient/family education;Therapeutic activities;Therapeutic exercise    PT Goals (Current goals can be found in the Care Plan section)  Acute Rehab PT Goals Patient Stated Goal: get better, independence PT Goal Formulation: With patient Time For Goal Achievement: 11/26/22 Potential to Achieve Goals: Good    Frequency Min 3X/week     Co-evaluation               AM-PAC PT "6 Clicks" Mobility  Outcome Measure Help needed turning from your back to your side while in a flat bed without using bedrails?: A Little Help needed  moving from lying on your back to sitting on the side of a flat bed without using bedrails?: A Lot Help needed moving to and from a bed to a chair (including a wheelchair)?: A Little Help needed standing up from a chair using your arms (e.g., wheelchair or bedside chair)?: A Little Help needed to walk in hospital room?: A Little Help needed climbing 3-5 steps with a railing? : A Lot 6 Click Score: 16    End of Session Equipment Utilized During Treatment: Gait belt;Oxygen Activity Tolerance: Patient tolerated treatment well Patient left: in bed;with call bell/phone within reach Nurse Communication: Mobility status PT Visit Diagnosis: Muscle weakness (generalized) (M62.81);Difficulty in walking, not elsewhere classified (R26.2)    Time: 8616-8372 PT Time Calculation (min) (ACUTE ONLY): 29 min   Charges:   PT Evaluation $PT Eval Moderate Complexity: 1 Mod PT Treatments $Gait Training: 8-22 mins        Aida Raider, PT  Office # [phone] Pager [phone]   Ilda Foil 11/12/2022, 11:08 AM

## 2022-11-12 NOTE — Progress Notes (Addendum)
Advanced Heart Failure Rounding Note  PCP-Cardiologist: None   Subjective:    12/26: Unsuccessful mTEER with severe residual MR d/t chordal disruption, 2 XTW clips being placed at A2/P2 position. Unable to place 3rd clip. 12/27: Extubated 12/28: AF with RVR. Converted to SR with IV amiodarone boluses.  CO-OX 74% on NE 8 + Milrinone 0.375  CVP 10 sitting up in chair.  Scr peaked at 3, trending down. 2.61 today.  Feeling well. No dyspnea.    Objective:   Weight Range: 128 kg Body mass index is 34.35 kg/m.   Vital Signs:   Temp:  [98.3 F (36.8 C)-99.5 F (37.5 C)] 98.4 F (36.9 C) (12/29 0830) Pulse Rate:  [63-115] 71 (12/29 0645) Resp:  [17-31] 20 (12/29 0645) BP: (87-132)/(61-94) 105/66 (12/29 0645) SpO2:  [88 %-97 %] 97 % (12/29 0645) Arterial Line BP: (81-145)/(62-93) 139/79 (12/29 0645) Weight:  [132 kg] 128 kg (12/29 0500) Last BM Date : 11/07/22  Weight change: Filed Weights   11/10/22 0500 11/11/22 0500 11/12/22 0500  Weight: 125.6 kg 125.5 kg 128 kg    Intake/Output:   Intake/Output Summary (Last 24 hours) at 11/12/2022 0935 Last data filed at 11/12/2022 0800 Gross per 24 hour  Intake 2080.19 ml  Output 2290 ml  Net -209.81 ml     Physical Exam  General:  Sitting up in chair.  HEENT: normal Neck: supple. JVP 10-12 Cor: PMI nondisplaced. Regular rate & rhythm with ectopy. No rubs, gallops, 2/6 MR murmur Lungs: clear Abdomen: soft, nontender, nondistended. Extremities: no cyanosis, clubbing, rash, 1-2+ edema, + RUE PICC Neuro: alert & orientedx3. Affect pleasant    Telemetry    Sinus 90s, brief runs NSVT, PVCs (personally reviewed)   Labs    CBC Recent Labs    11/11/22 0443 11/12/22 0356  WBC 14.6* 11.0*  HGB 11.9* 11.3*  HCT 34.7* 32.2*  MCV 85.0 82.4  PLT 163 195   Basic Metabolic Panel Recent Labs    44/01/02 0334 11/10/22 0903 11/11/22 0443 11/11/22 1657 11/12/22 0356  NA 134*   < > 132* 129* 131*  K 4.9   < >  4.3 3.8 3.8  CL 104   < > 101 99 99  CO2 20*   < > 19* 18* 19*  GLUCOSE 199*   < > 133* 203* 173*  BUN 39*   < > 50* 48* 48*  CREATININE 2.60*   < > 3.00* 2.79* 2.61*  CALCIUM 9.1   < > 8.6* 8.5* 8.6*  MG 2.2  --  2.0  --  2.0  PHOS 6.5*  --   --   --   --    < > = values in this interval not displayed.   Liver Function Tests No results for input(s): "AST", "ALT", "ALKPHOS", "BILITOT", "PROT", "ALBUMIN" in the last 72 hours.  No results for input(s): "LIPASE", "AMYLASE" in the last 72 hours. Cardiac Enzymes No results for input(s): "CKTOTAL", "CKMB", "CKMBINDEX", "TROPONINI" in the last 72 hours.  BNP: BNP (last 3 results) Recent Labs    10/18/22 1821 10/25/22 1442 10/28/22 1825  BNP 1,494.7* 1,428.2* 491.6*    ProBNP (last 3 results) No results for input(s): "PROBNP" in the last 8760 hours.   D-Dimer No results for input(s): "DDIMER" in the last 72 hours. Hemoglobin A1C No results for input(s): "HGBA1C" in the last 72 hours.  Fasting Lipid Panel Recent Labs    11/10/22 0334  TRIG 66   Thyroid Function Tests No  results for input(s): "TSH", "T4TOTAL", "T3FREE", "THYROIDAB" in the last 72 hours.  Invalid input(s): "FREET3"   Other results:   Imaging    DG Chest Port 1 View  Result Date: 11/12/2022 CLINICAL DATA:  Pulmonary edema. EXAM: PORTABLE CHEST 1 VIEW COMPARISON:  Chest x-ray 11/10/2022. FINDINGS: Similar versus slightly progressed interstitial pulmonary edema, cardiomegaly and pulmonary vascular congestion. No visible pleural effusions or pneumothorax. Left subclavian approach cardiac rhythm maintenance device. No acute osseous abnormality. IMPRESSION: Similar versus slightly progressed probable interstitial pulmonary edema, cardiomegaly and pulmonary vascular congestion. Electronically Signed   By: Feliberto Harts M.D.   On: 11/12/2022 08:42     Medications:     Scheduled Medications:  albuterol  2.5 mg Nebulization Once   arformoterol  15 mcg  Nebulization BID   aspirin EC  81 mg Oral Daily   atorvastatin  80 mg Oral Daily   Chlorhexidine Gluconate Cloth  6 each Topical Daily   docusate sodium  100 mg Oral BID   furosemide  80 mg Intravenous BID   insulin aspart  0-15 Units Subcutaneous TID WC   insulin aspart  0-5 Units Subcutaneous QHS   insulin glargine-yfgn  17 Units Subcutaneous Daily   revefenacin  175 mcg Nebulization Daily   sodium chloride flush  10-40 mL Intracatheter Q12H   sodium chloride flush  3 mL Intravenous Q12H   sodium chloride flush  3 mL Intravenous Q12H   sodium chloride flush  3 mL Intravenous Q12H    Infusions:  sodium chloride     sodium chloride     amiodarone 60 mg/hr (11/12/22 0625)   heparin 1,350 Units/hr (11/12/22 0600)   milrinone 0.375 mcg/kg/min (11/12/22 0600)   norepinephrine (LEVOPHED) Adult infusion 11 mcg/min (11/12/22 0600)    PRN Medications: sodium chloride, sodium chloride, acetaminophen, albuterol, ALPRAZolam, melatonin, mouth rinse, prochlorperazine, sodium chloride flush, sodium chloride flush, sodium chloride flush    Patient Profile   Douglas [name] is a 46 year old with a history of HTN, hyperlipidemia, smoker, cocaine abuse,NSVT,  NICM, Medtronic ICD, and chronic HFrEF. Last used cocaine about a week ago.  EF has been down for some time.    Admitted recently with A/C HFrEF. Suspect he had residual congestion when discharged.  Readmitted 12/11 with A/C HFrEF.  Assessment/Plan   1. A/C HFrEF  - NICM. Has Medtronic ICD. 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   - Admit early December. Suspect still had volume on board at discharge + increased fluid intake leading to readmission several days later. - NYHA IV on admit - TEE this admit EF 20%, severe central MR - RHC on 11/05/22 RA 2 PCW 33 (v = 48) CI ~2.0  - s/p unsuccessful MitraClip secondary to severe residual MR with torn lateral chordae tendonae - Echo 12/27: EF 20-25%, RV moderately reduced, moderate  to severe residual MR - CO-OX 74% on Milrinone 0.375 + NE 8. Aim to keep SBP > 100. - CVP 10. Continue IV lasix 80 BID - Place TED hose. 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 This may be the last opportunity to consider advanced therapies including VAD. Initially turned down for VAD due primarily due to limited social support. Planning to reevaluate options with the team now that he had unsuccessful MitraClip. However, will need improvement in renal function before moving towards VAD.   2. AKI on CKD Stage IIIb - Creatinine on admit 2.  Baseline previously ~ 1.6.  - Cr up to 3 yesterday, now starting to trend down. - ? D/t episodes of hypotension +/- low-output - Diuretics as above. - Continue inotrope support   3. Substance Abuse - Last used cocaine about a week PTA. - Discussed cessation.  - He is motivated to quit  4. CAD - LHC 02/2022 nonobstructive CAD - LDL 124  - Continue atorvastatin    5. DMII -On SSI -Holding Farxiga for now   6. HTN  - Stable    7. NSVT - Previously on amio but he stopped in April of this year.  I am not sure why he stopped.  - Frequent burst of NSVT noted on device since the beginning of December.  - Now on IV amio d/t frequent runs NSVT.  - Continue IV amio while on inotrope support - Short runs NSVT + frequent PVCs on tele - Keep K > 4.0 Mg > 2.0  - Off Coreg with low-output   8. Obesity  - Body mass index is 34.35 kg/m.   9. MR  - TEE 12/14: EF 20%, Severe central MR - S/p unsuccessful mTEER 12/26 as above with residual severe MR  10. Mediastinal lymph nodes, GGO RUL - Noted on preVAD CT - Will need repeat imaging in 3-6 months to follow  11. Urinary retention - Now has foley - Can reevaluate need today, discussed with TRN  12. Atrial fibrillation - Afib with RVR 12/28 - Converted to SR with IV amio boluses - Maintaining SR - On heparin gtt  Mobilize  today.  SDOH: -Has medicaid -Works part-time at [employer].  -Has been turned down for VAD due primarily due to limited social support. Now reconsidering if/when renal function improves. His brother has voiced wanting to take on role of caregiver.   Length of Stay: 18  [facility], [place], PA-C  9:35 AM  Advanced Heart Failure Team Pager [phone] (M-F; 7a - 5p)  Please contact [facility] for night-coverage after hours (5p -7a ) and weekends on [email or web address]   Agree with above.   Remains on NE and milrinone. Co-ox 74% but dropped to 58% after inotropes changed by CCM.  Back in NSR on IV amio. Slightly more SOB today. CVP 10. SCr 2.8 -> 2.6 -> 2.4  General:  Sitting in chair No resp difficulty HEENT: normal Neck: supple. JVP 10 Carotids 2+ bilat; no bruits. No lymphadenopathy or thryomegaly appreciated. Cor: Regular rate & rhythm. 2/6 at apex Lungs: clear Abdomen: soft, nontender, nondistended. No hepatosplenomegaly. No bruits or masses. Good bowel sounds. Extremities: no cyanosis, clubbing, rash, edema Neuro: alert & orientedx3, cranial nerves grossly intact. moves all 4 extremities w/o difficulty. Affect pleasant  Remains very tenuous. I readjusted pressors/inotropes. Would NOT down titrate meds until Scr nadirs (previous baseline 1.6-1.9) as he will need to get SCr under 2.0 if we are to consider VAD.   Would continue full support. Once back to baseline can then wean NE and milrinone slowly to see if he can tolerate (suspect he won't). If fails wean will need to re-consider VAD candidacy.   Please do NOT titrate/adjust NE or milrinone without discussing with AHF/VAD team  CRITICAL CARE Performed by: Glori Bickers  Total critical care time: 35 minutes  Critical care time was exclusive of separately billable procedures and treating other patients.  Critical care was necessary to treat or prevent imminent or life-threatening deterioration.  Critical care was time spent  personally by me (independent of midlevel providers  or residents) on the following activities: development of treatment plan with patient and/or surrogate as well as nursing, discussions with consultants, evaluation of patient's response to treatment, examination of patient, obtaining history from patient or surrogate, ordering and performing treatments and interventions, ordering and review of laboratory studies, ordering and review of radiographic studies, pulse oximetry and re-evaluation of patient's condition.  Glori Bickers, MD  7:15 PM

## 2022-11-12 NOTE — Progress Notes (Signed)
ANTICOAGULATION CONSULT NOTE  Pharmacy Consult for Heparin Indication: atrial fibrillation  Allergies  Allergen Reactions   Bydureon [Exenatide] Rash   Penicillins Other (See Comments)    Childhood allergy Unknown reaction    Patient Measurements: Height: 6\' 4"  (193 cm) Weight: 128 kg (282 lb 3 oz) IBW/kg (Calculated) : 86.8 Heparin Dosing Weight: 90kg   Vital Signs: Temp: 98.4 F (36.9 C) (12/29 0830) Temp Source: Oral (12/29 0830) BP: 107/55 (12/29 1200) Pulse Rate: 93 (12/29 1200)  Labs: Recent Labs    11/10/22 0334 11/10/22 0903 11/11/22 0443 11/11/22 1657 11/11/22 1953 11/12/22 0356 11/12/22 1150  HGB 14.2  --  11.9*  --   --  11.3*  --   HCT 40.9  --  34.7*  --   --  32.2*  --   PLT 329  --  163  --   --  195  --   HEPARINUNFRC  --   --   --   --  0.36 0.22* 0.17*  CREATININE 2.60*   < > 3.00* 2.79*  --  2.61*  --    < > = values in this interval not displayed.     Estimated Creatinine Clearance: 50.6 mL/min (A) (by C-G formula based on SCr of 2.61 mg/dL (H)).   Medical History: Past Medical History:  Diagnosis Date   Depression, major, single episode 01/14/2017   Diabetes mellitus [facility])    ED (erectile dysfunction)    History of syncope 01/29/2015   Hypertension    ICD (implantable cardioverter-defibrillator) discharge 11/30/2014   On 11/30/14. Asymptomatic.    Nonischemic cardiomyopathy (HCC)    a.  echo 4/06: EF 30%, mild to mod MR, mild RAE, inf HK, lat HK , ant HK;    b.  cath 4/06: no CAD, EF 20-25%   NSVT (nonsustained ventricular tachycardia) (HCC)    Obesity    Systolic CHF, chronic (HCC)    EF 11/17 25-30%, s/p ICD      Assessment: 46yom with heart failure with new atrial fibrillation. Pharmacy asked to dose heparin drip while in ICU and decisions regarding procedures discussed.  Heparin level came back subtherapeutic at 0.17, on 1350 units/hr. Hgb 11.3, plt 195 - stable. No s/sx of bleeding or infusion issues. Scr continues to improve  - now down to 2.61.    Goal of Therapy:  Heparin level 0.3-0.7 units/ml Monitor platelets by anticoagulation protocol: Yes   Plan:  Increase heparin infusion to 1550 uts/hr  Order heparin level in 8 hours  Daily heparin level, CBC Monitor s/s bleeding   12/17, PharmD, BCCCP Clinical Pharmacist  Phone: [phone] 11/12/2022 1:23 PM  Please check AMION for all [facility] Pharmacy phone numbers After 10:00 PM, call Main Pharmacy [phone]

## 2022-11-12 NOTE — Progress Notes (Signed)
NAME:  Douglas [name], MRN:  [id number], DOB:  August 03, 1976, LOS: 15 ADMISSION DATE:  10/25/2022, CONSULTATION DATE:  12/26 REFERRING MD:  Cardiology - Bensimhon, CHIEF COMPLAINT:  respiratory failure   History of Present Illness:  Douglas [name] is a 46 y/o gentleman with a history of cocaine abuse, former tobacco abuse, NICM, HFrEF who presented with acute decompensated heart failure on 12/11 after being discharged from a recent admission for hypervolemia. He required inotropic support for diuresis and was tolerating GDMT vasodilators and spironolactone, farxiga. He has had episodes of NSVT this admission. He has severe MR and underwent mitral clip x 3 with decompensation and increasing pulmonary edema.  Due to increasing oxygen requirements during the case he remained intubated and was transferred to the ICU.  PCCM was consulted for critical care management.  Pertinent  Medical History  Cocaine abuse HFrEF, NICM HTN DM NSVT Tobacco abuse CKD 3b  Significant Hospital Events: Including procedures, antibiotic start and stop dates in addition to other pertinent events   12/11 admitted 12/14 TEE showing EF 20% with global hypokinesis, severely dilated LA, severe functional MR, moderate TR 12/26 mitral clip 12/27 hypotensive overnight. On high dose NE and still on Epi. His Co-ox looks good. CVP 8-12, extubated. 12/28 renal function a little worse, respiratory status improving.  Weaning pressors Co. oximetry at goal 12/29 still on pressors  Interim History / Subjective:  No distress Ambulating   Objective   Blood pressure (!) 122/97, pulse (!) 105, temperature 98.3 F (36.8 C), temperature source Oral, resp. rate 18, height 6\' 4"  (1.93 m), weight 122.5 kg, SpO2 94 %. CVP:  [8 mmHg-16 mmHg] 16 mmHg  Vent Mode: PRVC FiO2 (%):  [100 %] 100 % Set Rate:  [18 bmp] 18 bmp Vt Set:  [690 mL] 690 mL PEEP:  [10 cmH20] 10 cmH20 Plateau Pressure:  [27 cmH20] 27 cmH20   Intake/Output Summary  (Last 24 hours) at 11/09/2022 1824 Last data filed at 11/09/2022 1728 Gross per 24 hour  Intake 1947.07 ml  Output 580 ml  Net 1367.07 ml   Filed Weights   11/06/22 0426 11/07/22 0300 11/08/22 0410  Weight: 122.5 kg 123 kg 122.5 kg    Examination: General resting in bed HENT NCAT no JVD Pulm  cl dec bases Card rrr Abd soft Ext warm and dry  Neuro intact Gu cl yellow    Resolved Hospital Problem list     Assessment & Plan:  Acute on chronic HfrEF Severe MR, s/p mitral clip  NSVT Plan Holding goal-directed medical therapy until he is off norepinephrine Continue milrinone (weaning & hope this helps 11/10/22 get him off NE) Wean norepinephrine for mean arterial pressure greater than 65 Continue amiodarone Currently holding hydralazine and Imdur Daily assessment for diuresis, will defer to adv HF   Acute respiratory failure with hypoxia Acute pulmonary edema Plan Mobilize Spiro  Wean o2  HLD Plan Statin   Acute on chronic renal failure (CKD IIIb) w/ urinary retention and rising sCr -Serum creatinine continues to rise, I suspect this is multifactorial Plan Renal dose meds Strict I&O Avoid hypotension  Foley out today   Mild leukocytosis Plan Monitor    Qtc prolongation d/c'd  QT prolonging meds (pepcid) Current qtc 497 Plan Avoid qtc prolonging agents  Mediastinal adenopathy GGO on CT Plan repeat imaging after 6 months  DM with uncontrolled hyperglycemia;  Plan Sliding scale insulin goal 140-180 Increased glargine to 20  units daily   Best Practice (right click and "  Reselect all SmartList Selections" daily)   Diet/type: Regular consistency (see orders) DVT prophylaxis: LMWH GI prophylaxis: PPI Lines: Central line and Arterial Line Foley:  Yes, and it is still needed Code Status:  full code Last date of multidisciplinary goals of care discussion [  ]  My cct 32 min  Simonne Martinet ACNP-BC [facility] Pulmonary/Critical Care Pager # [phone]  OR # [phone] if no answer

## 2022-11-12 NOTE — Progress Notes (Signed)
Pt resting in bed, no resp. distress noted. No BIPAP needed at this time.

## 2022-11-12 NOTE — Progress Notes (Signed)
Nutrition Follow-up  DOCUMENTATION CODES:   Not applicable  INTERVENTION:   Double protein portions with meals Nightly snack to increase nutritional frequency MVI with minerals daily     NUTRITION DIAGNOSIS:   Increased nutrient needs related to acute illness, other (see comment) (nutritional optimization for LVAD evaluation) as evidenced by estimated needs.  Being addressed  GOAL:   Patient will meet greater than or equal to 90% of their needs  Progressing  MONITOR:   PO intake, Labs, Weight trends  REASON FOR ASSESSMENT:   Other (Comment) (LVAD eval)    ASSESSMENT:   46 y.o. male admits related to SOB. PMH includes: HLD, HTN, DM, CKD stage 2, CHF. Pt is currently receiving medical management related to acute on chronic systolic CHF.  12/27 Extubated  LVAD evaluation ongoing Remains on levophed at 6, mirlinone and amiodarone gtt  Diet advanced to Heart healthy. Pt eating 100% of meals.   Current wt 128 kg; admit wt 125.2 kg. Net negative 10 L. Unsure of accuracy of current wt trend  Labs: sodium 131 (L), BUN 48, Creatinine 2.61, CBGs 190-266 Meds: ss novolog, semglee, lasix,    Diet Order:   Diet Order             Diet Heart Room service appropriate? Yes; Fluid consistency: Thin  Diet effective now                   EDUCATION NEEDS:   Education needs have been addressed  Skin:  Skin Assessment: Reviewed RN Assessment  Last BM:  12/29  Height:   Ht Readings from Last 1 Encounters:  10/26/22 6\' 4"  (1.93 m)    Weight:   Wt Readings from Last 1 Encounters:  11/12/22 128 kg    Ideal Body Weight:  91.8 kg  BMI:  Body mass index is 34.35 kg/m.  Estimated Nutritional Needs:   Kcal:  2400-2600  Protein:  120-135g  Fluid:  >/=2L   11/14/22 MS, RDN, LDN, CNSC Registered Dietitian 3 Clinical Nutrition RD Pager and On-Call Pager Number Located in [place]

## 2022-11-13 ENCOUNTER — Inpatient Hospital Stay: Payer: Self-pay

## 2022-11-13 DIAGNOSIS — I340 Nonrheumatic mitral (valve) insufficiency: Secondary | ICD-10-CM | POA: Diagnosis not present

## 2022-11-13 DIAGNOSIS — I5023 Acute on chronic systolic (congestive) heart failure: Secondary | ICD-10-CM | POA: Diagnosis not present

## 2022-11-13 DIAGNOSIS — J810 Acute pulmonary edema: Secondary | ICD-10-CM | POA: Diagnosis not present

## 2022-11-13 LAB — CBC
HCT: 32.7 % — ABNORMAL LOW (ref 39.0–52.0)
Hemoglobin: 11.6 g/dL — ABNORMAL LOW (ref 13.0–17.0)
MCH: 28.8 pg (ref 26.0–34.0)
MCHC: 35.5 g/dL (ref 30.0–36.0)
MCV: 81.1 fL (ref 80.0–100.0)
Platelets: 183 10*3/uL (ref 150–400)
RBC: 4.03 MIL/uL — ABNORMAL LOW (ref 4.22–5.81)
RDW: 13.2 % (ref 11.5–15.5)
WBC: 9.9 10*3/uL (ref 4.0–10.5)
nRBC: 0 % (ref 0.0–0.2)

## 2022-11-13 LAB — BASIC METABOLIC PANEL
Anion gap: 11 (ref 5–15)
BUN: 47 mg/dL — ABNORMAL HIGH (ref 6–20)
CO2: 20 mmol/L — ABNORMAL LOW (ref 22–32)
Calcium: 8.6 mg/dL — ABNORMAL LOW (ref 8.9–10.3)
Chloride: 99 mmol/L (ref 98–111)
Creatinine, Ser: 2.23 mg/dL — ABNORMAL HIGH (ref 0.61–1.24)
GFR, Estimated: 35 mL/min — ABNORMAL LOW (ref >60)
Glucose, Bld: 221 mg/dL — ABNORMAL HIGH (ref 70–99)
Potassium: 3.7 mmol/L (ref 3.5–5.1)
Sodium: 130 mmol/L — ABNORMAL LOW (ref 135–145)

## 2022-11-13 LAB — COOXEMETRY PANEL
Carboxyhemoglobin: 1.8 % — ABNORMAL HIGH (ref 0.5–1.5)
Methemoglobin: 0.8 % (ref 0.0–1.5)
O2 Saturation: 57.9 %
Total hemoglobin: 10.9 g/dL — ABNORMAL LOW (ref 12.0–16.0)

## 2022-11-13 LAB — GLUCOSE, CAPILLARY
Glucose-Capillary: 146 mg/dL — ABNORMAL HIGH (ref 70–99)
Glucose-Capillary: 228 mg/dL — ABNORMAL HIGH (ref 70–99)
Glucose-Capillary: 167 mg/dL — ABNORMAL HIGH (ref 70–99)
Glucose-Capillary: 173 mg/dL — ABNORMAL HIGH (ref 70–99)
Glucose-Capillary: 119 mg/dL — ABNORMAL HIGH (ref 70–99)

## 2022-11-13 LAB — HEPARIN LEVEL (UNFRACTIONATED)
Heparin Unfractionated: 0.18 IU/mL — ABNORMAL LOW (ref 0.30–0.70)
Heparin Unfractionated: 0.22 IU/mL — ABNORMAL LOW (ref 0.30–0.70)
Heparin Unfractionated: 0.34 IU/mL (ref 0.30–0.70)

## 2022-11-13 LAB — MAGNESIUM: Magnesium: 2.2 mg/dL (ref 1.7–2.4)

## 2022-11-13 MED ORDER — ALTEPLASE 2 MG IJ SOLR
2.0000 mg | Freq: Once | INTRAMUSCULAR | Status: AC
  Administered 2022-11-13: 2 mg
  Filled 2022-11-13: qty 2

## 2022-11-13 MED ORDER — POTASSIUM CHLORIDE CRYS ER 20 MEQ PO TBCR
40.0000 meq | EXTENDED_RELEASE_TABLET | Freq: Once | ORAL | Status: AC
  Administered 2022-11-13: 40 meq via ORAL
  Filled 2022-11-13: qty 2

## 2022-11-13 MED ORDER — NOREPINEPHRINE 4 MG/250ML-% IV SOLN
3.0000 ug/min | INTRAVENOUS | Status: DC
  Administered 2022-11-13 – 2022-11-16 (×5): 3 ug/min via INTRAVENOUS
  Filled 2022-11-13 (×6): qty 250

## 2022-11-13 NOTE — Progress Notes (Signed)
NAME:  Douglas [name], MRN:  [id number], DOB:  [birth year], LOS: 19 ADMISSION DATE:  10/25/2022, CONSULTATION DATE:  12/26 REFERRING MD:  Cardiology - Bensimhon, CHIEF COMPLAINT:  respiratory failure   History of Present Illness:  Douglas [name] is a 46 y/o gentleman with a history of cocaine abuse, former tobacco abuse, NICM, HFrEF who presented with acute decompensated heart failure on 12/11 after being discharged from a recent admission for hypervolemia. He required inotropic support for diuresis and was tolerating GDMT vasodilators and spironolactone, farxiga. He has had episodes of NSVT this admission. He has severe MR and underwent mitral clip x 3 with decompensation and increasing pulmonary edema.  Due to increasing oxygen requirements during the case he remained intubated and was transferred to the ICU.  PCCM was consulted for critical care management.  Pertinent  Medical History  Cocaine abuse HFrEF, NICM with Medtronic ICD.  No obstructive coronary disease on cath.  EF 20% with central MR. HTN DM NSVT Tobacco abuse CKD 3b  Significant Hospital Events: Including procedures, antibiotic start and stop dates in addition to other pertinent events   12/11 admitted 12/14 TEE showing EF 20% with global hypokinesis, severely dilated LA, severe functional MR, moderate TR 12/26 mitral clip 12/27 hypotensive overnight. On high dose NE and still on Epi. His Co-ox looks good. CVP 8-12, extubated. 12/28 renal function a little worse, respiratory status improving.  Weaning pressors Co. oximetry at goal 12/29 still on pressors  Interim History / Subjective:  No distress Ambulating   Objective   Blood pressure 98/66, pulse 77, temperature 98.1 F (36.7 C), temperature source Oral, resp. rate 14, height 6\' 4"  (1.93 m), weight 129.4 kg, SpO2 100 %. CVP:  [9 mmHg-17 mmHg] 14 mmHg      Intake/Output Summary (Last 24 hours) at 11/13/2022 1528 Last data filed at 11/13/2022 1300 Gross per 24  hour  Intake 1784.16 ml  Output 1300 ml  Net 484.16 ml    Filed Weights   11/11/22 0500 11/12/22 0500 11/13/22 0500  Weight: 125.5 kg 128 kg 129.4 kg    Examination: General resting in bed HENT NCAT no JVD Pulm  cl dec bases Card rrr Abd soft Ext warm and dry  Neuro intact Gu cl yellow    Ancillary tests personally reviewed  Creatinine continues to improve now 2.23 Hyponatremia 130 SCV O2 57.9 percent  Assessment & Plan:  Acute on chronic HfrEF Severe MR, s/p mitral clip  NSVT Plan Holding goal-directed medical therapy until he is off norepinephrine Continue milrinone (weaning & hope this helps 11/15/22 get him off NE) Wean norepinephrine for mean arterial pressure greater than 65 Continue amiodarone Currently holding hydralazine and Imdur Continuing daily diuresis.  SCV O2 creeping up may be over diuresed.  Acute respiratory failure with hypoxia now extubated Acute pulmonary edema Plan Mobilize Spiro  Wean FiO2  HLD Plan Statin   Acute on chronic renal failure (CKD IIIb) w/ urinary retention and improving creatinine Plan Renal dose meds Strict I&O Avoid hypotension  Foley out today   Mild leukocytosis Plan Monitor   Qtc prolongation d/c'd  QT prolonging meds (pepcid) Current qtc 497 Plan Avoid qtc prolonging agents  Mediastinal adenopathy GGO on CT Plan repeat imaging after 6 months  DM with uncontrolled hyperglycemia;  Plan Adequate glycemic control on current regimen.  Best Practice (right click and "Reselect all SmartList Selections" daily)   Diet/type: Regular consistency (see orders) DVT prophylaxis: LMWH GI prophylaxis: PPI Lines: Central line and Arterial Line  Foley:  Yes, and it is still needed Code Status:  full code Last date of multidisciplinary goals of care discussion [  ]  CRITICAL CARE Performed by: Lynnell Catalan   Total critical care time: 35 minutes  Critical care time was exclusive of separately billable  procedures and treating other patients.  Critical care was necessary to treat or prevent imminent or life-threatening deterioration.  Critical care was time spent personally by me on the following activities: development of treatment plan with patient and/or surrogate as well as nursing, discussions with consultants, evaluation of patient's response to treatment, examination of patient, obtaining history from patient or surrogate, ordering and performing treatments and interventions, ordering and review of laboratory studies, ordering and review of radiographic studies, pulse oximetry, re-evaluation of patient's condition and participation in multidisciplinary rounds.  Lynnell Catalan, MD [facility] ICU Physician [facility] [place] Critical Care  Pager: [phone] Mobile: [phone] After hours: [phone].

## 2022-11-13 NOTE — Progress Notes (Signed)
ANTICOAGULATION CONSULT NOTE  Pharmacy Consult for Heparin Indication: atrial fibrillation  Allergies  Allergen Reactions   Bydureon [Exenatide] Rash   Penicillins Other (See Comments)    Childhood allergy Unknown reaction    Patient Measurements: Height: 6\' 4"  (193 cm) Weight: 129.4 kg (285 lb 4.4 oz) IBW/kg (Calculated) : 86.8 Heparin Dosing Weight: 90kg   Vital Signs: Temp: 98.4 F (36.9 C) (12/30 1900) Temp Source: Oral (12/30 1900) BP: 112/87 (12/30 2000) Pulse Rate: 85 (12/30 2000)  Labs: Recent Labs    11/11/22 0443 11/11/22 1657 11/12/22 0356 11/12/22 1150 11/12/22 1650 11/12/22 2116 11/13/22 0506 11/13/22 0845 11/13/22 1138 11/13/22 2104  HGB 11.9*  --  11.3*  --   --   --   --  11.6*  --   --   HCT 34.7*  --  32.2*  --   --   --   --  32.7*  --   --   PLT 163  --  195  --   --   --   --  183  --   --   HEPARINUNFRC  --    < > 0.22*   < >  --    < > 0.18*  --  0.22* 0.34  CREATININE 3.00*   < > 2.61*  --  2.42*  --   --  2.23*  --   --    < > = values in this interval not displayed.     Estimated Creatinine Clearance: 59.5 mL/min (A) (by C-G formula based on SCr of 2.23 mg/dL (H)).   Medical History: Past Medical History:  Diagnosis Date   Depression, major, single episode 01/14/2017   Diabetes mellitus [facility])    ED (erectile dysfunction)    History of syncope 01/29/2015   Hypertension    ICD (implantable cardioverter-defibrillator) discharge 11/30/2014   On 11/30/14. Asymptomatic.    Nonischemic cardiomyopathy (HCC)    a.  echo 4/06: EF 30%, mild to mod MR, mild RAE, inf HK, lat HK , ant HK;    b.  cath 4/06: no CAD, EF 20-25%   NSVT (nonsustained ventricular tachycardia) (HCC)    Obesity    Systolic CHF, chronic (HCC)    EF 11/17 25-30%, s/p ICD      Assessment: 46yom with heart failure with new atrial fibrillation. Pharmacy asked to dose heparin drip while in ICU and decisions regarding procedures discussed.  Heparin level came back  subtherapeutic at 0.17, on 1350 units/hr. Hgb 11.3, plt 195 - stable. No s/sx of bleeding or infusion issues. Scr continues to improve - now down to 2.61.   Heparin level therapeutic on low end of goal at 0.34 on 2200 units/hr. No bleeding noted.   Goal of Therapy:  Heparin level 0.3-0.7 units/ml Monitor platelets by anticoagulation protocol: Yes   Plan:  Increase heparin infusion to 2300 units/hr  Daily heparin level, CBC Monitor s/s bleeding   Thank you for involving pharmacy in this patient's care.  12/17, PharmD, BCPS Clinical Pharmacist Clinical phone for 11/13/2022 is [phone] 11/13/2022 9:45 PM

## 2022-11-13 NOTE — Progress Notes (Signed)
ANTICOAGULATION CONSULT NOTE  Pharmacy Consult for Heparin Indication: atrial fibrillation Brief A/P: Heparin level subtherapeutic Increase Heparin rate  Allergies  Allergen Reactions   Bydureon [Exenatide] Rash   Penicillins Other (See Comments)    Childhood allergy Unknown reaction    Patient Measurements: Height: 6\' 4"  (193 cm) Weight: 128 kg (282 lb 3 oz) IBW/kg (Calculated) : 86.8 Heparin Dosing Weight: 90kg   Vital Signs: Temp: 98.5 F (36.9 C) (12/29 2000) Temp Source: Oral (12/29 2000) BP: 99/65 (12/30 0500) Pulse Rate: 78 (12/30 0500)  Labs: Recent Labs    11/11/22 0443 11/11/22 1657 11/11/22 1953 11/12/22 0356 11/12/22 1150 11/12/22 1650 11/12/22 2116 11/13/22 0506  HGB 11.9*  --   --  11.3*  --   --   --   --   HCT 34.7*  --   --  32.2*  --   --   --   --   PLT 163  --   --  195  --   --   --   --   HEPARINUNFRC  --   --    < > 0.22* 0.17*  --  0.21* 0.18*  CREATININE 3.00* 2.79*  --  2.61*  --  2.42*  --   --    < > = values in this interval not displayed.     Estimated Creatinine Clearance: 54.5 mL/min (A) (by C-G formula based on SCr of 2.42 mg/dL (H)).   Assessment: 46 y.o. male with Afib for heparin  Goal of Therapy:  Heparin level 0.3-0.7 units/ml Monitor platelets by anticoagulation protocol: Yes   Plan:  Increase Heparin 2050 units/hr Check heparin level in 8 hours.   52, PharmD, BCPS  11/13/2022 5:36 AM

## 2022-11-13 NOTE — Progress Notes (Signed)
Exchange PICC order: Received order to exchange PICC for triple lumen. Arrived to room, Eli, RN reports pt has adequate access at this time. Exchange order to be discontinued.

## 2022-11-13 NOTE — Progress Notes (Signed)
Patient ID: Douglas [name], male   DOB: 06/20/1976, 46 y.o.   MRN: [id number]     Advanced Heart Failure Rounding Note  PCP-Cardiologist: None   Subjective:    12/26: Unsuccessful mTEER with severe residual MR d/t chordal disruption, 2 XTW clips being placed at A2/P2 position. Unable to place 3rd clip. 12/27: Extubated 12/28: AF with RVR. Converted to SR with IV amiodarone boluses.  PICC not functioning today, needs exchange.  CO-OX 58% yesterday.  He remains on milrinone 0.375, NE titrated off overnight.  He says he feels more sluggish today.  I/Os approximately even on Lasix 80 mg IV bid.  No BMET this morning. Creatinine 2.42 yesterday.   He remains in NSR with occasional PVCs, on amiodarone gtt 60 mg/hr and heparin gtt.    Objective:   Weight Range: 129.4 kg Body mass index is 34.72 kg/m.   Vital Signs:   Temp:  [98.1 F (36.7 C)-98.6 F (37 C)] 98.1 F (36.7 C) (12/30 0810) Pulse Rate:  [70-187] 78 (12/30 0500) Resp:  [15-31] 19 (12/30 0700) BP: (96-113)/(55-84) 113/78 (12/30 0700) SpO2:  [82 %-100 %] 100 % (12/30 0810) Arterial Line BP: (83-175)/(50-116) 131/84 (12/30 0700) FiO2 (%):  [36 %] 36 % (12/29 1003) Weight:  [129.4 kg] 129.4 kg (12/30 0500) Last BM Date : 11/12/22 (per patient)  Weight change: Filed Weights   11/11/22 0500 11/12/22 0500 11/13/22 0500  Weight: 125.5 kg 128 kg 129.4 kg    Intake/Output:   Intake/Output Summary (Last 24 hours) at 11/13/2022 0826 Last data filed at 11/13/2022 0700 Gross per 24 hour  Intake 2165.99 ml  Output 1550 ml  Net 615.99 ml     Physical Exam  General: NAD Neck: JVP 10 cm, no thyromegaly or thyroid nodule.  Lungs: Clear to auscultation bilaterally with normal respiratory effort. CV: Lateral PMI.  Heart regular S1/S2, +S3, 1/6 HSM apex.  No peripheral edema.   Abdomen: Soft, nontender, no hepatosplenomegaly, no distention.  Skin: Intact without lesions or rashes.  Neurologic: Alert and oriented x 3.   Psych: Normal affect. Extremities: No clubbing or cyanosis.  HEENT: Normal.    Telemetry    NSR with occasional PVCs (personally reviewed)   Labs    CBC Recent Labs    11/11/22 0443 11/12/22 0356  WBC 14.6* 11.0*  HGB 11.9* 11.3*  HCT 34.7* 32.2*  MCV 85.0 82.4  PLT 163 195   Basic Metabolic Panel Recent Labs    28/31/51 0443 11/11/22 1657 11/12/22 0356 11/12/22 1650  NA 132*   < > 131* 131*  K 4.3   < > 3.8 4.2  CL 101   < > 99 102  CO2 19*   < > 19* 18*  GLUCOSE 133*   < > 173* 215*  BUN 50*   < > 48* 48*  CREATININE 3.00*   < > 2.61* 2.42*  CALCIUM 8.6*   < > 8.6* 8.2*  MG 2.0  --  2.0  --    < > = values in this interval not displayed.   Liver Function Tests No results for input(s): "AST", "ALT", "ALKPHOS", "BILITOT", "PROT", "ALBUMIN" in the last 72 hours.  No results for input(s): "LIPASE", "AMYLASE" in the last 72 hours. Cardiac Enzymes No results for input(s): "CKTOTAL", "CKMB", "CKMBINDEX", "TROPONINI" in the last 72 hours.  BNP: BNP (last 3 results) Recent Labs    10/18/22 1821 10/25/22 1442 10/28/22 1825  BNP 1,494.7* 1,428.2* 491.6*    ProBNP (last 3  results) No results for input(s): "PROBNP" in the last 8760 hours.   D-Dimer No results for input(s): "DDIMER" in the last 72 hours. Hemoglobin A1C No results for input(s): "HGBA1C" in the last 72 hours.  Fasting Lipid Panel No results for input(s): "CHOL", "HDL", "LDLCALC", "TRIG", "CHOLHDL", "LDLDIRECT" in the last 72 hours.  Thyroid Function Tests No results for input(s): "TSH", "T4TOTAL", "T3FREE", "THYROIDAB" in the last 72 hours.  Invalid input(s): "FREET3"   Other results:   Imaging    [place] EKG SITE RITE  Result Date: 11/13/2022 If Site Rite image not attached, placement could not be confirmed due to current cardiac rhythm.    Medications:     Scheduled Medications:  albuterol  2.5 mg Nebulization Once   arformoterol  15 mcg Nebulization BID   aspirin EC  81  mg Oral Daily   atorvastatin  80 mg Oral Daily   Chlorhexidine Gluconate Cloth  6 each Topical Daily   docusate sodium  100 mg Oral BID   furosemide  80 mg Intravenous BID   insulin aspart  0-20 Units Subcutaneous TID WC   insulin aspart  0-5 Units Subcutaneous QHS   insulin glargine-yfgn  20 Units Subcutaneous Daily   multivitamin with minerals  1 tablet Oral Daily   revefenacin  175 mcg Nebulization Daily   sodium chloride flush  10-40 mL Intracatheter Q12H   sodium chloride flush  3 mL Intravenous Q12H   sodium chloride flush  3 mL Intravenous Q12H   sodium chloride flush  3 mL Intravenous Q12H    Infusions:  sodium chloride     sodium chloride     amiodarone 60 mg/hr (11/13/22 0813)   heparin 2,050 Units/hr (11/13/22 0700)   milrinone 0.375 mcg/kg/min (11/13/22 0700)   norepinephrine (LEVOPHED) Adult infusion      PRN Medications: sodium chloride, sodium chloride, acetaminophen, albuterol, ALPRAZolam, guaiFENesin, melatonin, mouth rinse, prochlorperazine, sodium chloride flush, sodium chloride flush, sodium chloride flush    Patient Profile   [name] is a 46 year old with a history of HTN, hyperlipidemia, smoker, cocaine abuse,NSVT,  NICM, Medtronic ICD, and chronic HFrEF. Last used cocaine about a week ago.  EF has been down for some time.    Admitted recently with A/C HFrEF. Suspect he had residual congestion when discharged.  Readmitted 12/11 with A/C HFrEF.  Assessment/Plan   1. A/C HFrEF  - NICM. Has Medtronic ICD. 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   - Admit early December. Suspect still had volume on board at discharge + increased fluid intake leading to readmission several days later. - NYHA IV on admit - TEE this admit EF 20%, severe central MR - RHC on 11/05/22 RA 2 PCW 33 (v = 48) CI ~2.0  - s/p unsuccessful MitraClip secondary to severe residual MR with torn lateral chordae tendonae - Echo 12/27: EF 20-25%, RV moderately reduced,  moderate to severe residual MR - Unable to get co-ox this morning as PICC is clotted.  However, feels more sluggish. On milrinone 0.375 and off NE.  Would continue milrinone and put back on NE at 3 (SBP 130s).  Would NOT decrease NE unless BP is markedly high. - Unable to get CVP but JVP around 10.  Will continue Lasix 80 mg IV bid for now pending co-ox and CVP check after PICC exchanged.  - Exchange PICC this morning.  - Hold GDMT until off pressor/inotrope support - Has end-stage HF and appears that he is now inotrope  dependent. This may be the last opportunity to consider advanced therapies including VAD. Initially turned down for VAD due to limited social support (basically homeless), renal dysfunction, and substance abuse. Planning to reevaluate options with the team now that he had unsuccessful MitraClip. Regardless, will need improved creatinine before re-consideration.   2. AKI on CKD Stage IIIb - Creatinine on admit 2. Baseline previously ~ 1.6.  - Cr up to 3 on 12/28, down to 2.4 yesterday.  No BMET yet today.  - ? D/t episodes of hypotension +/- low-output - Diuretics as above. - Continue inotrope support, restarting NE as above.  - Stat BMET this morning.    3. Substance Abuse - Last used cocaine about a week PTA. - Discussed cessation.  - He is motivated to quit  4. CAD - LHC 02/2022 nonobstructive CAD - LDL 124  - Continue atorvastatin    5. DMII -On SSI -Holding Farxiga for now   6. HTN  - Stable    7. NSVT - Previously on amio but he stopped in April of this year.  I am not sure why he stopped.  - Frequent burst of NSVT noted on device since the beginning of December.  - Now on IV amio d/t frequent runs NSVT.  - Continue IV amio while on inotrope support, can decrease to 30 mg/hr.  - Occasional PVCs on telemetry.  - Keep K > 4.0 Mg > 2.0  - Off Coreg with low-output   8. Obesity  - Body mass index is 34.72 kg/m.   9. MR  - TEE 12/14: EF 20%, Severe central  MR - S/p unsuccessful mTEER 12/26 as above with residual severe MR  10. Mediastinal lymph nodes, GGO RUL - Noted on preVAD CT - Will need repeat imaging in 3-6 months to follow  11. Urinary retention - Now has foley - Can reevaluate need today, discussed with TRN  12. Atrial fibrillation - Afib with RVR 12/28 - Converted to SR with IV amio boluses - Maintaining SR on amiodarone gtt, can decrease to 30 mg/hr.  - On heparin gtt  Mobilize today.  SDOH: -Has medicaid -Works part-time at [employer].  -Has been turned down for VAD due primarily due to limited social support. Now reconsidering if/when renal function improves. His brother has voiced wanting to take on role of caregiver.  CRITICAL CARE Performed by: Marca Ancona  Total critical care time: 40 minutes  Critical care time was exclusive of separately billable procedures and treating other patients.  Critical care was necessary to treat or prevent imminent or life-threatening deterioration.  Critical care was time spent personally by me (independent of midlevel providers or residents) on the following activities: development of treatment plan with patient and/or surrogate as well as nursing, discussions with consultants, evaluation of patient's response to treatment, examination of patient, obtaining history from patient or surrogate, ordering and performing treatments and interventions, ordering and review of laboratory studies, ordering and review of radiographic studies, pulse oximetry and re-evaluation of patient's condition.  Marca Ancona, MD  8:26 AM

## 2022-11-13 NOTE — Progress Notes (Signed)
ANTICOAGULATION CONSULT NOTE  Pharmacy Consult for Heparin Indication: atrial fibrillation  Allergies  Allergen Reactions   Bydureon [Exenatide] Rash   Penicillins Other (See Comments)    Childhood allergy Unknown reaction    Patient Measurements: Height: 6\' 4"  (193 cm) Weight: 129.4 kg (285 lb 4.4 oz) IBW/kg (Calculated) : 86.8 Heparin Dosing Weight: 90kg   Vital Signs: Temp: 98.1 F (36.7 C) (12/30 1149) Temp Source: Oral (12/30 1149) BP: 117/78 (12/30 1200) Pulse Rate: 91 (12/30 1200)  Labs: Recent Labs    11/11/22 0443 11/11/22 1657 11/12/22 0356 11/12/22 1150 11/12/22 1650 11/12/22 2116 11/13/22 0506 11/13/22 0845 11/13/22 1138  HGB 11.9*  --  11.3*  --   --   --   --  11.6*  --   HCT 34.7*  --  32.2*  --   --   --   --  32.7*  --   PLT 163  --  195  --   --   --   --  183  --   HEPARINUNFRC  --    < > 0.22*   < >  --  0.21* 0.18*  --  0.22*  CREATININE 3.00*   < > 2.61*  --  2.42*  --   --  2.23*  --    < > = values in this interval not displayed.     Estimated Creatinine Clearance: 59.5 mL/min (A) (by C-G formula based on SCr of 2.23 mg/dL (H)).   Medical History: Past Medical History:  Diagnosis Date   Depression, major, single episode 01/14/2017   Diabetes mellitus [facility])    ED (erectile dysfunction)    History of syncope 01/29/2015   Hypertension    ICD (implantable cardioverter-defibrillator) discharge 11/30/2014   On 11/30/14. Asymptomatic.    Nonischemic cardiomyopathy (HCC)    a.  echo 4/06: EF 30%, mild to mod MR, mild RAE, inf HK, lat HK , ant HK;    b.  cath 4/06: no CAD, EF 20-25%   NSVT (nonsustained ventricular tachycardia) (HCC)    Obesity    Systolic CHF, chronic (HCC)    EF 11/17 25-30%, s/p ICD      Assessment: 46yom with heart failure with new atrial fibrillation. Pharmacy asked to dose heparin drip while in ICU and decisions regarding procedures discussed.  Heparin level came back subtherapeutic at 0.17, on 1350 units/hr.  Hgb 11.3, plt 195 - stable. No s/sx of bleeding or infusion issues. Scr continues to improve - now down to 2.61.   Heparin drip increased this AM, but still subtherapeutic on recheck. No bleeding or issues with IV infusion per RN.   Goal of Therapy:  Heparin level 0.3-0.7 units/ml Monitor platelets by anticoagulation protocol: Yes   Plan:  Increase heparin infusion to 2250 uts/hr  Check heparin level in 8 hours  Daily heparin level, CBC Monitor s/s bleeding   12/17, Reece Leader, [facility] Clinical Pharmacist  11/13/2022 1:19 PM   [facility] pharmacy phone numbers are listed on [email or web address]

## 2022-11-13 NOTE — Progress Notes (Signed)
TPA removed from red port with scant blood return.  PICC retracted 2-3 cm noted at insertion site w/ drsg CDI.  CXR 12/29 appears PICC to be upper SVC vs brachiocephalic region.Recommend PICC exchange. Eli RN at bedside aware of assesment

## 2022-11-14 DIAGNOSIS — I5023 Acute on chronic systolic (congestive) heart failure: Secondary | ICD-10-CM | POA: Diagnosis not present

## 2022-11-14 LAB — BASIC METABOLIC PANEL
Anion gap: 10 (ref 5–15)
Anion gap: 10 (ref 5–15)
BUN: 45 mg/dL — ABNORMAL HIGH (ref 6–20)
BUN: 50 mg/dL — ABNORMAL HIGH (ref 6–20)
CO2: 20 mmol/L — ABNORMAL LOW (ref 22–32)
CO2: 21 mmol/L — ABNORMAL LOW (ref 22–32)
Calcium: 8.4 mg/dL — ABNORMAL LOW (ref 8.9–10.3)
Calcium: 8.5 mg/dL — ABNORMAL LOW (ref 8.9–10.3)
Chloride: 100 mmol/L (ref 98–111)
Chloride: 99 mmol/L (ref 98–111)
Creatinine, Ser: 2.08 mg/dL — ABNORMAL HIGH (ref 0.61–1.24)
Creatinine, Ser: 2.33 mg/dL — ABNORMAL HIGH (ref 0.61–1.24)
GFR, Estimated: 39 mL/min — ABNORMAL LOW (ref >60)
GFR, Estimated: 34 mL/min — ABNORMAL LOW (ref >60)
Glucose, Bld: 116 mg/dL — ABNORMAL HIGH (ref 70–99)
Glucose, Bld: 144 mg/dL — ABNORMAL HIGH (ref 70–99)
Potassium: 3.5 mmol/L (ref 3.5–5.1)
Potassium: 3.4 mmol/L — ABNORMAL LOW (ref 3.5–5.1)
Sodium: 130 mmol/L — ABNORMAL LOW (ref 135–145)
Sodium: 130 mmol/L — ABNORMAL LOW (ref 135–145)

## 2022-11-14 LAB — HEPARIN LEVEL (UNFRACTIONATED): Heparin Unfractionated: 0.28 IU/mL — ABNORMAL LOW (ref 0.30–0.70)

## 2022-11-14 LAB — CBC
HCT: 31.8 % — ABNORMAL LOW (ref 39.0–52.0)
Hemoglobin: 10.7 g/dL — ABNORMAL LOW (ref 13.0–17.0)
MCH: 28.3 pg (ref 26.0–34.0)
MCHC: 33.6 g/dL (ref 30.0–36.0)
MCV: 84.1 fL (ref 80.0–100.0)
Platelets: 188 10*3/uL (ref 150–400)
RBC: 3.78 MIL/uL — ABNORMAL LOW (ref 4.22–5.81)
RDW: 13.5 % (ref 11.5–15.5)
WBC: 10.9 10*3/uL — ABNORMAL HIGH (ref 4.0–10.5)
nRBC: 0 % (ref 0.0–0.2)

## 2022-11-14 LAB — COOXEMETRY PANEL
Carboxyhemoglobin: 2.4 % — ABNORMAL HIGH (ref 0.5–1.5)
Methemoglobin: <0.7 % (ref 0.0–1.5)
O2 Saturation: 64.7 %
Total hemoglobin: 11.2 g/dL — ABNORMAL LOW (ref 12.0–16.0)

## 2022-11-14 LAB — GLUCOSE, CAPILLARY
Glucose-Capillary: 124 mg/dL — ABNORMAL HIGH (ref 70–99)
Glucose-Capillary: 183 mg/dL — ABNORMAL HIGH (ref 70–99)
Glucose-Capillary: 183 mg/dL — ABNORMAL HIGH (ref 70–99)
Glucose-Capillary: 246 mg/dL — ABNORMAL HIGH (ref 70–99)

## 2022-11-14 LAB — MAGNESIUM: Magnesium: 2.2 mg/dL (ref 1.7–2.4)

## 2022-11-14 MED ORDER — METOLAZONE 2.5 MG PO TABS
2.5000 mg | ORAL_TABLET | Freq: Once | ORAL | Status: AC
  Administered 2022-11-14: 2.5 mg via ORAL
  Filled 2022-11-14: qty 1

## 2022-11-14 MED ORDER — POTASSIUM CHLORIDE CRYS ER 20 MEQ PO TBCR
40.0000 meq | EXTENDED_RELEASE_TABLET | Freq: Once | ORAL | Status: AC
  Administered 2022-11-15: 40 meq via ORAL
  Filled 2022-11-14: qty 2

## 2022-11-14 MED ORDER — AMIODARONE IV BOLUS ONLY 150 MG/100ML
150.0000 mg | Freq: Once | INTRAVENOUS | Status: AC
  Administered 2022-11-14: 150 mg via INTRAVENOUS

## 2022-11-14 MED ORDER — POTASSIUM CHLORIDE CRYS ER 20 MEQ PO TBCR
40.0000 meq | EXTENDED_RELEASE_TABLET | Freq: Once | ORAL | Status: AC
  Administered 2022-11-14: 40 meq via ORAL
  Filled 2022-11-14: qty 2

## 2022-11-14 NOTE — Progress Notes (Signed)
eLink Physician-Brief Progress Note Patient Name: Douglas [name] DOB: January 27, 1976 MRN: [id number]   Date of Service  11/14/2022  HPI/Events of Note  AFib rate up to 133     115/64  on amio at 60, levo 3 and milirinone .375. Received bolus earlier today.  Current HR 109 K 3.5 this afternoon given 40 meqs K. Creatinine 2. Ongoing diuresis  eICU Interventions  Repeat BMP tonight Discussed with bedside RN Elink to be informed of result     Intervention Category Intermediate Interventions: Arrhythmia - evaluation and management  Darl Pikes 11/14/2022, 10:14 PM

## 2022-11-14 NOTE — Progress Notes (Signed)
eLink Physician-Brief Progress Note Patient Name: BEACHER EVERY DOB: May 03, 1976 MRN: [id number]   Date of Service  11/14/2022  HPI/Events of Note  K is 3.4, creatinine 2.33. ON diuresis.   eICU Interventions  Give 40 meq oral K x 1 Will check Mag at 4 am and also send AM labs at 4 am to see if K has improved Plan to keep K close to 4, mag over 2 Please inform us of 4 am results      Intervention Category Major Interventions: Electrolyte abnormality - evaluation and management  Ethin Drummond G Shalev Helminiak 11/14/2022, 11:30 PM  Addendum at 5:30 am Notified of 4 am bmp and mag. K has dropped further. I am not sure if patient is absorbing oral K well enough. Will give 40 meq IV Kcl and repeat K this AM. Mag is ok.

## 2022-11-14 NOTE — Progress Notes (Signed)
Patient ID: DEXTON ATHEY, male   DOB: 13-Dec-1975, 46 y.o.   MRN: [id number]     Advanced Heart Failure Rounding Note  PCP-Cardiologist: None   Subjective:    12/26: Unsuccessful mTEER with severe residual MR d/t chordal disruption, 2 XTW clips being placed at A2/P2 position. Unable to place 3rd clip. 12/27: Extubated 12/28: AF with RVR. Converted to SR with IV amiodarone boluses.  NE restarted at 3 yesterday, today with co-ox 65% on NE 3, milrinone 0.375.  CVP 12-13, I/Os mildly negative yesterday with Lasix 80 mg IV bid.  Still with dyspnea.  Creatinine trending down 2.08.   He remains in NSR with short run of atrial fibrillation this morning, on amiodarone gtt 30 mg/hr and heparin gtt.    Objective:   Weight Range: 129.5 kg Body mass index is 34.75 kg/m.   Vital Signs:   Temp:  [98.1 F (36.7 C)-98.5 F (36.9 C)] 98.4 F (36.9 C) (12/31 0400) Pulse Rate:  [54-91] 87 (12/31 0800) Resp:  [14-25] 18 (12/31 0800) BP: (97-123)/(66-89) 105/80 (12/31 0800) SpO2:  [86 %-100 %] 97 % (12/31 0810) Arterial Line BP: (86-150)/(64-86) 146/86 (12/31 0800) Weight:  [129.5 kg] 129.5 kg (12/31 0500) Last BM Date : 11/12/22  Weight change: Filed Weights   11/12/22 0500 11/13/22 0500 11/14/22 0500  Weight: 128 kg 129.4 kg 129.5 kg    Intake/Output:   Intake/Output Summary (Last 24 hours) at 11/14/2022 0830 Last data filed at 11/14/2022 0800 Gross per 24 hour  Intake 1907.94 ml  Output 2250 ml  Net -342.06 ml     Physical Exam  General: NAD Neck: JVP 10-12 cm, no thyromegaly or thyroid nodule.  Lungs: Clear to auscultation bilaterally with normal respiratory effort. CV: Nondisplaced PMI.  Heart regular S1/S2, no S3/S4, no murmur.  No peripheral edema.    Abdomen: Soft, nontender, no hepatosplenomegaly, no distention.  Skin: Intact without lesions or rashes.  Neurologic: Alert and oriented x 3.  Psych: Normal affect. Extremities: No clubbing or cyanosis.  HEENT: Normal.    Telemetry    NSR with run of atrial fibrillation (personally reviewed)   Labs    CBC Recent Labs    11/13/22 0845 11/14/22 0112  WBC 9.9 10.9*  HGB 11.6* 10.7*  HCT 32.7* 31.8*  MCV 81.1 84.1  PLT 183 0000000   Basic Metabolic Panel Recent Labs    11/13/22 0845 11/14/22 0112  NA 130* 130*  K 3.7 3.5  CL 99 100  CO2 20* 20*  GLUCOSE 221* 116*  BUN 47* 45*  CREATININE 2.23* 2.08*  CALCIUM 8.6* 8.4*  MG 2.2 2.2   Liver Function Tests No results for input(s): "AST", "ALT", "ALKPHOS", "BILITOT", "PROT", "ALBUMIN" in the last 72 hours.  No results for input(s): "LIPASE", "AMYLASE" in the last 72 hours. Cardiac Enzymes No results for input(s): "CKTOTAL", "CKMB", "CKMBINDEX", "TROPONINI" in the last 72 hours.  BNP: BNP (last 3 results) Recent Labs    10/18/22 1821 10/25/22 1442 10/28/22 1825  BNP 1,494.7* 1,428.2* 491.6*    ProBNP (last 3 results) No results for input(s): "PROBNP" in the last 8760 hours.   D-Dimer No results for input(s): "DDIMER" in the last 72 hours. Hemoglobin A1C No results for input(s): "HGBA1C" in the last 72 hours.  Fasting Lipid Panel No results for input(s): "CHOL", "HDL", "LDLCALC", "TRIG", "CHOLHDL", "LDLDIRECT" in the last 72 hours.  Thyroid Function Tests No results for input(s): "TSH", "T4TOTAL", "T3FREE", "THYROIDAB" in the last 72 hours.  Invalid input(s): "  FREET3"   Other results:   Imaging    [place] EKG Site Rite  Result Date: 11/13/2022 If Site Rite image not attached, placement could not be confirmed due to current cardiac rhythm.    Medications:     Scheduled Medications:  albuterol  2.5 mg Nebulization Once   arformoterol  15 mcg Nebulization BID   aspirin EC  81 mg Oral Daily   atorvastatin  80 mg Oral Daily   Chlorhexidine Gluconate Cloth  6 each Topical Daily   docusate sodium  100 mg Oral BID   furosemide  80 mg Intravenous BID   insulin aspart  0-20 Units Subcutaneous TID WC   insulin aspart   0-5 Units Subcutaneous QHS   insulin glargine-yfgn  20 Units Subcutaneous Daily   metolazone  2.5 mg Oral Once   multivitamin with minerals  1 tablet Oral Daily   revefenacin  175 mcg Nebulization Daily   sodium chloride flush  10-40 mL Intracatheter Q12H   sodium chloride flush  3 mL Intravenous Q12H   sodium chloride flush  3 mL Intravenous Q12H   sodium chloride flush  3 mL Intravenous Q12H    Infusions:  sodium chloride 5 mL/hr at 11/14/22 0800   sodium chloride     amiodarone 30 mg/hr (11/14/22 0800)   heparin 2,300 Units/hr (11/14/22 0800)   milrinone 0.375 mcg/kg/min (11/14/22 0800)   norepinephrine (LEVOPHED) Adult infusion 3 mcg/min (11/14/22 0800)    PRN Medications: sodium chloride, sodium chloride, acetaminophen, albuterol, ALPRAZolam, guaiFENesin, melatonin, mouth rinse, prochlorperazine, sodium chloride flush, sodium chloride flush, sodium chloride flush    Patient Profile   Mr. Lamberti is a 46 year old with a history of HTN, hyperlipidemia, smoker, cocaine abuse,NSVT,  NICM, Medtronic ICD, and chronic HFrEF. Last used cocaine about a week ago.  EF has been down for some time.    Admitted recently with A/C HFrEF. Suspect he had residual congestion when discharged.  Readmitted 12/11 with A/C HFrEF.  Assessment/Plan   1. A/C HFrEF  - NICM. Has Medtronic ICD. Had [place] earlier this year with nonobstructive CAD.   - Admit early December. Suspect still had volume on board at discharge + increased fluid intake leading to readmission several days later. - NYHA IV on admit - TEE this admit EF 20%, severe central MR - RHC on 11/05/22 RA 2 PCW 33 (v = 48) CI ~2.0  - s/p unsuccessful MitraClip secondary to severe residual MR with torn lateral chordae tendonae - Echo 12/27: EF 20-25%, RV moderately reduced, moderate to severe residual MR - Co-ox 65% today on NE 3 + milrinone 0.375.  Feels better this morning, less sluggish than yesterday when off NE. - CVP remains 12-13 and  still short of breath, did not diurese well yesterday. Creatinine continues to trend down.  Will give Lasix 80 mg IV bid today with metolazone 2.5 x 1 this morning.  Replace K.  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 This may be the last opportunity to consider advanced therapies including VAD. Initially turned down for VAD due to limited social support (basically homeless), renal dysfunction, and substance abuse. Planning to reevaluate options with the team now that he had unsuccessful MitraClip. Will need improved creatinine <2 before re-consideration.   2. AKI on CKD Stage IIIb - Creatinine on admit 2. Baseline previously ~ 1.6.  - Cr up to 3 on 12/28, down to 2.08 today on  dual inotropes.  - ? D/t episodes of hypotension +/- low-output - Diuretics as above. - Continue inotropic support.    3. Substance Abuse - Last used cocaine about a week PTA. - Discussed cessation.  - He is motivated to quit  4. CAD - LHC 02/2022 nonobstructive CAD - LDL 124  - Continue atorvastatin    5. DMII -On SSI -Holding Farxiga for now   6. HTN  - Stable    7. NSVT - Previously on amio but he stopped in April of this year.  I am not sure why he stopped.  - Frequent burst of NSVT noted on device since the beginning of December.  - Now on IV amio d/t frequent runs NSVT.  - Continue amiodarone 30 mg/hr.  - Occasional PVCs on telemetry.  - Keep K > 4.0 Mg > 2.0  - Off Coreg with low-output   8. Obesity  - Body mass index is 34.75 kg/m.   9. MR  - TEE 12/14: EF 20%, Severe central MR - S/p unsuccessful mTEER 12/26 as above with residual severe MR  10. Mediastinal lymph nodes, GGO RUL - Noted on preVAD CT - Will need repeat imaging in 3-6 months to follow  11. Urinary retention - Now has foley - Can reevaluate need today, discussed with TRN  12. Atrial fibrillation - Afib with RVR 12/28 - Converted to SR with IV  amio boluses - Maintaining SR on amiodarone gtt.  - On heparin gtt  Mobilize today.  SDOH: -Has medicaid -Works part-time at [employer].  -Has been turned down for VAD due primarily due to limited social support. Now reconsidering if/when renal function improves. His brother has voiced wanting to take on role of caregiver.  CRITICAL CARE Performed by: Marca Ancona  Total critical care time: 40 minutes  Critical care time was exclusive of separately billable procedures and treating other patients.  Critical care was necessary to treat or prevent imminent or life-threatening deterioration.  Critical care was time spent personally by me (independent of midlevel providers or residents) on the following activities: development of treatment plan with patient and/or surrogate as well as nursing, discussions with consultants, evaluation of patient's response to treatment, examination of patient, obtaining history from patient or surrogate, ordering and performing treatments and interventions, ordering and review of laboratory studies, ordering and review of radiographic studies, pulse oximetry and re-evaluation of patient's condition.  Marca Ancona, MD  8:30 AM

## 2022-11-14 NOTE — Progress Notes (Signed)
eLink Physician-Brief Progress Note Patient Name: Douglas [name] DOB: 1976-06-06 MRN: [id number]   Date of Service  11/14/2022  HPI/Events of Note  K+ 3.5  eICU Interventions  K+ replaced per E-Link electrolyte replacement protocol.        [name] 11/14/2022, 2:32 AM

## 2022-11-14 NOTE — Progress Notes (Signed)
ANTICOAGULATION CONSULT NOTE  Pharmacy Consult for Heparin Indication: atrial fibrillation  Allergies  Allergen Reactions   Bydureon [Exenatide] Rash   Penicillins Other (See Comments)    Childhood allergy Unknown reaction    Patient Measurements: Height: 6\' 4"  (193 cm) Weight: 129.5 kg (285 lb 7.9 oz) IBW/kg (Calculated) : 86.8 Heparin Dosing Weight: 90kg   Vital Signs: Temp: 98 F (36.7 C) (12/31 1141) Temp Source: Oral (12/31 1141) BP: 107/80 (12/31 1200) Pulse Rate: 106 (12/31 1200)  Labs: Recent Labs    11/12/22 0356 11/12/22 1150 11/12/22 1650 11/12/22 2116 11/13/22 0845 11/13/22 1138 11/13/22 2104 11/14/22 0112 11/14/22 0414  HGB 11.3*  --   --   --  11.6*  --   --  10.7*  --   HCT 32.2*  --   --   --  32.7*  --   --  31.8*  --   PLT 195  --   --   --  183  --   --  188  --   HEPARINUNFRC 0.22*   < >  --    < >  --  0.22* 0.34  --  0.28*  CREATININE 2.61*  --  2.42*  --  2.23*  --   --  2.08*  --    < > = values in this interval not displayed.     Estimated Creatinine Clearance: 63.8 mL/min (A) (by C-G formula based on SCr of 2.08 mg/dL (H)).   Medical History: Past Medical History:  Diagnosis Date   Depression, major, single episode 01/14/2017   Diabetes mellitus [facility])    ED (erectile dysfunction)    History of syncope 01/29/2015   Hypertension    ICD (implantable cardioverter-defibrillator) discharge 11/30/2014   On 11/30/14. Asymptomatic.    Nonischemic cardiomyopathy (HCC)    a.  echo 4/06: EF 30%, mild to mod MR, mild RAE, inf HK, lat HK , ant HK;    b.  cath 4/06: no CAD, EF 20-25%   NSVT (nonsustained ventricular tachycardia) (HCC)    Obesity    Systolic CHF, chronic (HCC)    EF 11/17 25-30%, s/p ICD      Assessment: Douglas [name] with heart failure with new atrial fibrillation. Pharmacy asked to dose heparin drip while in ICU and decisions regarding procedures discussed.  Heparin level this morning just slightly subtherapeutic at 0.28, on  2300 units/hr. Hgb 10.7, plt 188 - stable. No s/sx of bleeding or infusion issues. Scr continues to improve - now down to 2.08.    Goal of Therapy:  Heparin level 0.3-0.7 units/ml Monitor platelets by anticoagulation protocol: Yes   Plan:  Increase heparin infusion to 2350 units/hr  Daily heparin level, CBC Monitor s/s bleeding   Thank you for involving pharmacy in this patient's care.  12/17, Reece Leader, BCCP Clinical Pharmacist  11/14/2022 12:52 PM   [facility] pharmacy phone numbers are listed on [email or web address]

## 2022-11-15 DIAGNOSIS — I5023 Acute on chronic systolic (congestive) heart failure: Secondary | ICD-10-CM | POA: Diagnosis not present

## 2022-11-15 LAB — CBC
HCT: 29.9 % — ABNORMAL LOW (ref 39.0–52.0)
Hemoglobin: 10.3 g/dL — ABNORMAL LOW (ref 13.0–17.0)
MCH: 28.9 pg (ref 26.0–34.0)
MCHC: 34.4 g/dL (ref 30.0–36.0)
MCV: 84 fL (ref 80.0–100.0)
Platelets: 211 10*3/uL (ref 150–400)
RBC: 3.56 MIL/uL — ABNORMAL LOW (ref 4.22–5.81)
RDW: 13.5 % (ref 11.5–15.5)
WBC: 10.8 10*3/uL — ABNORMAL HIGH (ref 4.0–10.5)
nRBC: 0.2 % (ref 0.0–0.2)

## 2022-11-15 LAB — BASIC METABOLIC PANEL
Anion gap: 16 — ABNORMAL HIGH (ref 5–15)
Anion gap: 10 (ref 5–15)
BUN: 48 mg/dL — ABNORMAL HIGH (ref 6–20)
BUN: 54 mg/dL — ABNORMAL HIGH (ref 6–20)
CO2: 21 mmol/L — ABNORMAL LOW (ref 22–32)
CO2: 19 mmol/L — ABNORMAL LOW (ref 22–32)
Calcium: 8.3 mg/dL — ABNORMAL LOW (ref 8.9–10.3)
Calcium: 8.6 mg/dL — ABNORMAL LOW (ref 8.9–10.3)
Chloride: 89 mmol/L — ABNORMAL LOW (ref 98–111)
Chloride: 97 mmol/L — ABNORMAL LOW (ref 98–111)
Creatinine, Ser: 2.24 mg/dL — ABNORMAL HIGH (ref 0.61–1.24)
Creatinine, Ser: 2.53 mg/dL — ABNORMAL HIGH (ref 0.61–1.24)
GFR, Estimated: 35 mL/min — ABNORMAL LOW (ref >60)
GFR, Estimated: 30 mL/min — ABNORMAL LOW (ref >60)
Glucose, Bld: 305 mg/dL — ABNORMAL HIGH (ref 70–99)
Glucose, Bld: 142 mg/dL — ABNORMAL HIGH (ref 70–99)
Potassium: 3.1 mmol/L — ABNORMAL LOW (ref 3.5–5.1)
Potassium: 3.5 mmol/L (ref 3.5–5.1)
Sodium: 126 mmol/L — ABNORMAL LOW (ref 135–145)
Sodium: 126 mmol/L — ABNORMAL LOW (ref 135–145)

## 2022-11-15 LAB — COOXEMETRY PANEL
Carboxyhemoglobin: 2.5 % — ABNORMAL HIGH (ref 0.5–1.5)
Methemoglobin: 0.7 % (ref 0.0–1.5)
O2 Saturation: 71 %
Total hemoglobin: 10.4 g/dL — ABNORMAL LOW (ref 12.0–16.0)

## 2022-11-15 LAB — MAGNESIUM: Magnesium: 2 mg/dL (ref 1.7–2.4)

## 2022-11-15 LAB — GLUCOSE, CAPILLARY
Glucose-Capillary: 155 mg/dL — ABNORMAL HIGH (ref 70–99)
Glucose-Capillary: 172 mg/dL — ABNORMAL HIGH (ref 70–99)
Glucose-Capillary: 131 mg/dL — ABNORMAL HIGH (ref 70–99)
Glucose-Capillary: 85 mg/dL (ref 70–99)

## 2022-11-15 LAB — HEPARIN LEVEL (UNFRACTIONATED): Heparin Unfractionated: 0.38 IU/mL (ref 0.30–0.70)

## 2022-11-15 MED ORDER — PANTOPRAZOLE SODIUM 40 MG PO TBEC
40.0000 mg | DELAYED_RELEASE_TABLET | Freq: Every day | ORAL | Status: DC
  Administered 2022-11-15 – 2022-11-19 (×5): 40 mg via ORAL
  Filled 2022-11-15 (×5): qty 1

## 2022-11-15 MED ORDER — POTASSIUM CHLORIDE CRYS ER 20 MEQ PO TBCR
40.0000 meq | EXTENDED_RELEASE_TABLET | Freq: Once | ORAL | Status: AC
  Administered 2022-11-15: 40 meq via ORAL
  Filled 2022-11-15: qty 2

## 2022-11-15 MED ORDER — POTASSIUM CHLORIDE 10 MEQ/50ML IV SOLN
10.0000 meq | INTRAVENOUS | Status: AC
  Administered 2022-11-15 (×4): 10 meq via INTRAVENOUS
  Filled 2022-11-15 (×4): qty 50

## 2022-11-15 MED ORDER — TOLVAPTAN 15 MG PO TABS
15.0000 mg | ORAL_TABLET | Freq: Once | ORAL | Status: AC
  Administered 2022-11-15: 15 mg via ORAL
  Filled 2022-11-15: qty 1

## 2022-11-15 NOTE — Progress Notes (Signed)
Pt resting comfortably on 2L [place], no distress noted, no BIPAP needed at this time.

## 2022-11-15 NOTE — Progress Notes (Signed)
Physical Therapy Treatment Patient Details Name: Douglas [name] MRN: [id number] DOB: Jan 22, 1976 Today's Date: 11/15/2022   History of Present Illness Pt is a 47 y.o. male admitted 10/25/22 with acute decompensated HF. TEE 12/14 with severe MR. S/p unsuccessful mTEER 12/26; post-op decompensation and increasing pulmonary edema requiring transfer to ICU. ETT 12/26-12/27. VAD team following due to failed Mitraclip. PMH includes CHF, DM2, HTN, NSVT, CKD, obesity, depression, anxiety, cocaine use, tobacco use.    PT Comments    Pt progressing steadily towards their physical therapy goals and is agreeable to participate; somewhat stoic this AM. Pt ambulating 295 ft with an [name] walker at a min guard assist level on 4L O2. Reports low back pain. Heat applied and instruction provided on self hamstring and gastroc stretch. Will continue to progress as tolerated.     Recommendations for follow up therapy are one component of a multi-disciplinary discharge planning process, led by the attending physician.  Recommendations may be updated based on patient status, additional functional criteria and insurance authorization.  Follow Up Recommendations  Other (comment) (OP Cardiac Rehab)     Assistance Recommended at Discharge PRN  Patient can return home with the following Assistance with cooking/housework;Assist for transportation   Equipment Recommendations  Rollator (4 wheels)    Recommendations for Other Services       Precautions / Restrictions Precautions Precautions: Fall;Other (comment) Precaution Comments: a line L wrist Restrictions Weight Bearing Restrictions: No     Mobility  Bed Mobility Overal bed mobility: Needs Assistance Bed Mobility: Supine to Sit     Supine to sit: Supervision          Transfers Overall transfer level: Needs assistance Equipment used:  (Ev walker) Transfers: Sit to/from Stand Sit to Stand: Min guard                 Ambulation/Gait Ambulation/Gait assistance: Min guard Gait Distance (Feet): 295 Feet Assistive device: [name] Gait Pattern/deviations: Step-through pattern, Decreased stride length       General Gait Details: Min guard for safety, one brief standing rest break   Stairs             Wheelchair Mobility    Modified Rankin (Stroke Patients Only)       Balance Overall balance assessment: Needs assistance Sitting-balance support: No upper extremity supported, Feet supported Sitting balance-Leahy Scale: Good     Standing balance support: No upper extremity supported, Bilateral upper extremity supported, During functional activity Standing balance-Leahy Scale: Fair                              Cognition Arousal/Alertness: Awake/alert Behavior During Therapy: Flat affect Overall Cognitive Status: Within Functional Limits for tasks assessed                                          Exercises General Exercises - Lower Extremity Ankle Circles/Pumps: Both, 20 reps, Supine Heel Slides: Both, 10 reps, Supine    General Comments        Pertinent Vitals/Pain Pain Assessment Pain Assessment: Faces Faces Pain Scale: Hurts little more Pain Location: low back Pain Descriptors / Indicators: Grimacing, Guarding, Discomfort Pain Intervention(s): Monitored during session, Heat applied    Home Living  Prior Function            PT Goals (current goals can now be found in the care plan section) Acute Rehab PT Goals Patient Stated Goal: get better, independence Potential to Achieve Goals: Good Progress towards PT goals: Progressing toward goals    Frequency    Min 3X/week      PT Plan Current plan remains appropriate    Co-evaluation              AM-PAC PT "6 Clicks" Mobility   Outcome Measure  Help needed turning from your back to your side while in a flat bed without using  bedrails?: None Help needed moving from lying on your back to sitting on the side of a flat bed without using bedrails?: A Little Help needed moving to and from a bed to a chair (including a wheelchair)?: A Little Help needed standing up from a chair using your arms (e.g., wheelchair or bedside chair)?: A Little Help needed to walk in hospital room?: A Little Help needed climbing 3-5 steps with a railing? : A Little 6 Click Score: 19    End of Session Equipment Utilized During Treatment: Oxygen Activity Tolerance: Patient tolerated treatment well Patient left: in chair;with call bell/phone within reach Nurse Communication: Mobility status PT Visit Diagnosis: Muscle weakness (generalized) (M62.81);Difficulty in walking, not elsewhere classified (R26.2)     Time: 0160-1093 PT Time Calculation (min) (ACUTE ONLY): 34 min  Charges:  $Therapeutic Activity: 23-37 mins                     Wyona Almas, PT, DPT Acute Rehabilitation Services Office [phone]    Deno Etienne 11/15/2022, 8:55 AM

## 2022-11-15 NOTE — Progress Notes (Signed)
Patient ID: Douglas [name], male   DOB: [birth year], 47 y.o.   MRN: [id number]     Advanced Heart Failure Rounding Note  PCP-Cardiologist: None   Subjective:    12/26: Unsuccessful mTEER with severe residual MR d/t chordal disruption, 2 XTW clips being placed at A2/P2 position. Unable to place 3rd clip. 12/27: Extubated 12/28: AF with RVR. Converted to SR with IV amiodarone boluses.  Today with co-ox 71% on NE 3, milrinone 0.375.  CVP 13, I/Os negative yesterday with Lasix 80 mg IV bid + metolazone 2.5 but ?weight up.  Na down to 126. Mild dyspnea with exertion.  Creatinine 2.08 => 2.33 => 2.24.   He remains in NSR this morning though in AF overnight, on amiodarone gtt 60 mg/hr and heparin gtt.    Objective:   Weight Range: 130.3 kg Body mass index is 34.97 kg/m.   Vital Signs:   Temp:  [97.7 F (36.5 C)-98.5 F (36.9 C)] 98.5 F (36.9 C) (01/01 0650) Pulse Rate:  [57-113] 96 (01/01 0700) Resp:  [15-30] 21 (01/01 0700) BP: (97-120)/(61-99) 115/91 (01/01 0700) SpO2:  [89 %-98 %] 93 % (01/01 0700) Arterial Line BP: (83-141)/(64-106) 112/77 (01/01 0700) Weight:  [130.3 kg] 130.3 kg (01/01 0500) Last BM Date : 11/12/22  Weight change: Filed Weights   11/13/22 0500 11/14/22 0500 11/15/22 0500  Weight: 129.4 kg 129.5 kg 130.3 kg    Intake/Output:   Intake/Output Summary (Last 24 hours) at 11/15/2022 0801 Last data filed at 11/15/2022 0700 Gross per 24 hour  Intake 2071.65 ml  Output 3700 ml  Net -1628.35 ml     Physical Exam  General: NAD Neck: JVP 12-14, no thyromegaly or thyroid nodule.  Lungs: Clear to auscultation bilaterally with normal respiratory effort. CV: Lateral PMI.  Heart regular S1/S2, no S3/S4, 1/6 HSM apex/LLSB.  1+ ankle edema.  Abdomen: Soft, nontender, no hepatosplenomegaly, no distention.  Skin: Intact without lesions or rashes.  Neurologic: Alert and oriented x 3.  Psych: Normal affect. Extremities: No clubbing or cyanosis.  HEENT: Normal.     Telemetry    NSR with run of atrial fibrillation overnight (personally reviewed)   Labs    CBC Recent Labs    11/14/22 0112 11/15/22 0340  WBC 10.9* 10.8*  HGB 10.7* 10.3*  HCT 31.8* 29.9*  MCV 84.1 84.0  PLT 188 211   Basic Metabolic Panel Recent Labs    78/67/67 0112 11/14/22 2218 11/15/22 0340  NA 130* 130* 126*  K 3.5 3.4* 3.1*  CL 100 99 89*  CO2 20* 21* 21*  GLUCOSE 116* 144* 305*  BUN 45* 50* 48*  CREATININE 2.08* 2.33* 2.24*  CALCIUM 8.4* 8.5* 8.3*  MG 2.2  --  2.0   Liver Function Tests No results for input(s): "AST", "ALT", "ALKPHOS", "BILITOT", "PROT", "ALBUMIN" in the last 72 hours.  No results for input(s): "LIPASE", "AMYLASE" in the last 72 hours. Cardiac Enzymes No results for input(s): "CKTOTAL", "CKMB", "CKMBINDEX", "TROPONINI" in the last 72 hours.  BNP: BNP (last 3 results) Recent Labs    10/18/22 1821 10/25/22 1442 10/28/22 1825  BNP 1,494.7* 1,428.2* 491.6*    ProBNP (last 3 results) No results for input(s): "PROBNP" in the last 8760 hours.   D-Dimer No results for input(s): "DDIMER" in the last 72 hours. Hemoglobin A1C No results for input(s): "HGBA1C" in the last 72 hours.  Fasting Lipid Panel No results for input(s): "CHOL", "HDL", "LDLCALC", "TRIG", "CHOLHDL", "LDLDIRECT" in the last 72 hours.  Thyroid  Function Tests No results for input(s): "TSH", "T4TOTAL", "T3FREE", "THYROIDAB" in the last 72 hours.  Invalid input(s): "FREET3"   Other results:   Imaging    No results found.   Medications:     Scheduled Medications:  albuterol  2.5 mg Nebulization Once   arformoterol  15 mcg Nebulization BID   aspirin EC  81 mg Oral Daily   atorvastatin  80 mg Oral Daily   Chlorhexidine Gluconate Cloth  6 each Topical Daily   docusate sodium  100 mg Oral BID   furosemide  80 mg Intravenous BID   insulin aspart  0-20 Units Subcutaneous TID WC   insulin aspart  0-5 Units Subcutaneous QHS   insulin glargine-yfgn   20 Units Subcutaneous Daily   multivitamin with minerals  1 tablet Oral Daily   revefenacin  175 mcg Nebulization Daily   sodium chloride flush  10-40 mL Intracatheter Q12H   sodium chloride flush  3 mL Intravenous Q12H   sodium chloride flush  3 mL Intravenous Q12H   sodium chloride flush  3 mL Intravenous Q12H    Infusions:  sodium chloride Stopped (11/15/22 0650)   sodium chloride     amiodarone 60 mg/hr (11/15/22 0700)   heparin 2,350 Units/hr (11/15/22 0700)   milrinone 0.375 mcg/kg/min (11/15/22 0700)   norepinephrine (LEVOPHED) Adult infusion 3 mcg/min (11/15/22 0700)   potassium chloride 50 mL/hr at 11/15/22 0700    PRN Medications: sodium chloride, sodium chloride, acetaminophen, albuterol, ALPRAZolam, guaiFENesin, melatonin, mouth rinse, prochlorperazine, sodium chloride flush, sodium chloride flush, sodium chloride flush    Patient Profile   Douglas [name] is a 47 year old with a history of HTN, hyperlipidemia, smoker, cocaine abuse,NSVT,  NICM, Medtronic ICD, and chronic HFrEF. Last used cocaine about a week ago.  EF has been down for some time.    Admitted recently with A/C HFrEF. Suspect he had residual congestion when discharged.  Readmitted 12/11 with A/C HFrEF.  Assessment/Plan   1. A/C HFrEF  - NICM. Has Medtronic ICD. Had [place] earlier this year with nonobstructive CAD.   - Admit early December. Suspect still had volume on board at discharge + increased fluid intake leading to readmission several days later. - NYHA IV on admit - TEE this admit EF 20%, severe central MR - RHC on 11/05/22 RA 2 PCW 33 (v = 48) CI ~2.0  - s/p unsuccessful MitraClip secondary to severe residual MR with torn lateral chordae tendonae - Echo 12/27: EF 20-25%, RV moderately reduced, moderate to severe residual MR - Co-ox 65% today on NE 3 + milrinone 0.375.  Feels better this morning, less sluggish than yesterday when off NE. - CVP remains 13 and still short of breath with exertion, I/Os  negative but weight not down. Creatinine fairly stable at 2.24.  Will give Lasix 80 mg IV bid today with tolvaptan 15 mg x 1 this morning given hyponatremia.  Replace K and check pm BMET.   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 This may be the last opportunity to consider advanced therapies including VAD. Initially turned down for VAD due to limited social support (basically homeless), renal dysfunction, and substance abuse. Planning to reevaluate options with the team now that he had unsuccessful MitraClip. Will need improved creatinine <2 before re-consideration. If we cannot get him fully diuresed and creatinine < 2, will need to see if he would be candidate for home milrinone to  see if we could get renal function stabilized over time.   2. AKI on CKD Stage IIIb - Creatinine on admit 2. Baseline previously ~ 1.6.  - Cr up to 3 on 12/28, 2.08 => 2.33 => 2.24 over the last day on dual inotropes.  - ? D/t episodes of hypotension +/- low-output - Diuretics as above. - Continue inotropic support.    3. Substance Abuse - Last used cocaine about a week PTA. - Discussed cessation.  - He is motivated to quit  4. CAD - LHC 02/2022 nonobstructive CAD - LDL 124  - Continue atorvastatin    5. DMII -On SSI -Holding Farxiga for now   6. HTN  - Stable    7. NSVT - Previously on amio but he stopped in April of this year.  I am not sure why he stopped.  - Frequent burst of NSVT noted on device since the beginning of December.  - Now on IV amio d/t frequent runs NSVT.  - Continue amiodarone 30 mg/hr.  - Occasional PVCs on telemetry.  - Keep K > 4.0 Mg > 2.0  - Off Coreg with low-output   8. Obesity  - Body mass index is 34.97 kg/m.   9. MR  - TEE 12/14: EF 20%, Severe central MR - S/p unsuccessful mTEER 12/26 as above with residual severe MR  10. Mediastinal lymph nodes, GGO RUL - Noted on preVAD CT - Will need  repeat imaging in 3-6 months to follow  11. Urinary retention - Now has foley - Can reevaluate need today, discussed with TRN  12. Atrial fibrillation - Afib with RVR 12/28 - Converted to SR with IV amio boluses - Maintaining SR on amiodarone gtt with periodic AF runs.  - On heparin gtt  13. Hypontremia: Hypervolemic hyponatremia.  - Tolvaptan 15 mg x 1 today.  - pm BMET - Fluid restrict  Mobilize today.  SDOH: -Has medicaid -Works part-time at [employer].  -Has been turned down for VAD due primarily due to limited social support. Now reconsidering if/when renal function improves. His brother has voiced wanting to take on role of caregiver.  CRITICAL CARE Performed by: Loralie Champagne  Total critical care time: 40 minutes  Critical care time was exclusive of separately billable procedures and treating other patients.  Critical care was necessary to treat or prevent imminent or life-threatening deterioration.  Critical care was time spent personally by me (independent of midlevel providers or residents) on the following activities: development of treatment plan with patient and/or surrogate as well as nursing, discussions with consultants, evaluation of patient's response to treatment, examination of patient, obtaining history from patient or surrogate, ordering and performing treatments and interventions, ordering and review of laboratory studies, ordering and review of radiographic studies, pulse oximetry and re-evaluation of patient's condition.  Loralie Champagne, MD  8:01 AM

## 2022-11-15 NOTE — Progress Notes (Signed)
ANTICOAGULATION CONSULT NOTE  Pharmacy Consult for Heparin Indication: atrial fibrillation  Allergies  Allergen Reactions   Bydureon [Exenatide] Rash   Penicillins Other (See Comments)    Childhood allergy Unknown reaction    Patient Measurements: Height: 6\' 4"  (193 cm) Weight: 130.3 kg (287 lb 4.2 oz) IBW/kg (Calculated) : 86.8 Heparin Dosing Weight: 90kg   Vital Signs: Temp: 98.5 F (36.9 C) (01/01 0650) Temp Source: Oral (01/01 0650) BP: 115/91 (01/01 0700) Pulse Rate: 96 (01/01 0700)  Labs: Recent Labs    11/13/22 0845 11/13/22 1138 11/13/22 2104 11/14/22 0112 11/14/22 0414 11/14/22 2218 11/15/22 0340  HGB 11.6*  --   --  10.7*  --   --  10.3*  HCT 32.7*  --   --  31.8*  --   --  29.9*  PLT 183  --   --  188  --   --  211  HEPARINUNFRC  --    < > 0.34  --  0.28*  --  0.38  CREATININE 2.23*  --   --  2.08*  --  2.33* 2.24*   < > = values in this interval not displayed.     Estimated Creatinine Clearance: 59.4 mL/min (A) (by C-G formula based on SCr of 2.24 mg/dL (H)).   Medical History: Past Medical History:  Diagnosis Date   Depression, major, single episode 01/14/2017   Diabetes mellitus [facility])    ED (erectile dysfunction)    History of syncope 01/29/2015   Hypertension    ICD (implantable cardioverter-defibrillator) discharge 11/30/2014   On 11/30/14. Asymptomatic.    Nonischemic cardiomyopathy ([place])    a.  echo 4/06: EF 30%, mild to mod MR, mild RAE, inf HK, lat HK , ant HK;    b.  cath 4/06: no CAD, EF 20-25%   NSVT (nonsustained ventricular tachycardia) (HCC)    Obesity    Systolic CHF, chronic (HCC)    EF 11/17 25-30%, s/p ICD      Assessment: 46yom with heart failure with new atrial fibrillation. Pharmacy asked to dose heparin drip while in ICU and decisions regarding procedures discussed.  Heparin level this morning 0.38 at goal on heparin drip rate  2350 units/hr. Hgb stable 10s  plt stable 180-200. No s/sx of bleeding or infusion  issues. Continue iv anticoagulation until procedures decided In/out Afib on IV amiodarone   Goal of Therapy:  Heparin level 0.3-0.7 units/ml Monitor platelets by anticoagulation protocol: Yes   Plan:  Continue  heparin infusion  2350 units/hr  Daily heparin level, CBC Monitor s/s bleeding     Bonnita Nasuti Pharm.D. CPP, BCPS Clinical Pharmacist [phone] 11/15/2022 8:05 AM   [facility] pharmacy phone numbers are listed on [email or web address]

## 2022-11-16 DIAGNOSIS — N1832 Chronic kidney disease, stage 3b: Secondary | ICD-10-CM

## 2022-11-16 DIAGNOSIS — I5023 Acute on chronic systolic (congestive) heart failure: Secondary | ICD-10-CM | POA: Diagnosis not present

## 2022-11-16 DIAGNOSIS — E871 Hypo-osmolality and hyponatremia: Secondary | ICD-10-CM

## 2022-11-16 DIAGNOSIS — N179 Acute kidney failure, unspecified: Secondary | ICD-10-CM | POA: Diagnosis not present

## 2022-11-16 DIAGNOSIS — I472 Ventricular tachycardia, unspecified: Secondary | ICD-10-CM | POA: Diagnosis not present

## 2022-11-16 LAB — GLUCOSE, CAPILLARY
Glucose-Capillary: 115 mg/dL — ABNORMAL HIGH (ref 70–99)
Glucose-Capillary: 135 mg/dL — ABNORMAL HIGH (ref 70–99)
Glucose-Capillary: 139 mg/dL — ABNORMAL HIGH (ref 70–99)
Glucose-Capillary: 131 mg/dL — ABNORMAL HIGH (ref 70–99)

## 2022-11-16 LAB — CBC
HCT: 31 % — ABNORMAL LOW (ref 39.0–52.0)
Hemoglobin: 10.3 g/dL — ABNORMAL LOW (ref 13.0–17.0)
MCH: 28.2 pg (ref 26.0–34.0)
MCHC: 33.2 g/dL (ref 30.0–36.0)
MCV: 84.9 fL (ref 80.0–100.0)
Platelets: 220 10*3/uL (ref 150–400)
RBC: 3.65 MIL/uL — ABNORMAL LOW (ref 4.22–5.81)
RDW: 13.6 % (ref 11.5–15.5)
WBC: 12.4 10*3/uL — ABNORMAL HIGH (ref 4.0–10.5)
nRBC: 0.2 % (ref 0.0–0.2)

## 2022-11-16 LAB — BASIC METABOLIC PANEL
Anion gap: 15 (ref 5–15)
BUN: 56 mg/dL — ABNORMAL HIGH (ref 6–20)
CO2: 22 mmol/L (ref 22–32)
Calcium: 9 mg/dL (ref 8.9–10.3)
Chloride: 94 mmol/L — ABNORMAL LOW (ref 98–111)
Creatinine, Ser: 2.59 mg/dL — ABNORMAL HIGH (ref 0.61–1.24)
GFR, Estimated: 30 mL/min — ABNORMAL LOW (ref >60)
Glucose, Bld: 102 mg/dL — ABNORMAL HIGH (ref 70–99)
Potassium: 3.4 mmol/L — ABNORMAL LOW (ref 3.5–5.1)
Sodium: 131 mmol/L — ABNORMAL LOW (ref 135–145)

## 2022-11-16 LAB — MAGNESIUM: Magnesium: 2.1 mg/dL (ref 1.7–2.4)

## 2022-11-16 LAB — HEPARIN LEVEL (UNFRACTIONATED): Heparin Unfractionated: 0.3 IU/mL (ref 0.30–0.70)

## 2022-11-16 LAB — COOXEMETRY PANEL
Carboxyhemoglobin: 2.6 % — ABNORMAL HIGH (ref 0.5–1.5)
Methemoglobin: 0.7 % (ref 0.0–1.5)
O2 Saturation: 72.9 %
Total hemoglobin: 10.8 g/dL — ABNORMAL LOW (ref 12.0–16.0)

## 2022-11-16 MED ORDER — POTASSIUM CHLORIDE CRYS ER 20 MEQ PO TBCR
40.0000 meq | EXTENDED_RELEASE_TABLET | ORAL | Status: AC
  Administered 2022-11-16 (×2): 40 meq via ORAL
  Filled 2022-11-16 (×2): qty 2

## 2022-11-16 NOTE — Progress Notes (Signed)
CSW requested to contact family and inquire about housing options and caregiver role for possible re discussion regarding LVAD implantation.  CSW contacted Douglas [name] (patient's aunt) by phone to discuss housing and caregiver role. Patient's aunt states that she has a spare room although currently full of her clothes and other storage. She states she and her Douglas [name] would consider a temporary option of patient residing with them if a single bed was purchased for the patient. She mentioned that "Douglas [name] may not be too happy with this plan but if it's the only option for him then he will have to accept it". Douglas [name] states she works full time day shift and would not be able to be a caregiver for the 24/7 need and also stated unsure about assisting with any dressing changes long term. She states that patient's brother Douglas [name] is trying to find an apartment for both of them and that patient would be able to stay with him for the long term.  CSW then met with patient at bedside to discuss plan for housing and caregiver. CSW shared conversation with Douglas [name] and concern that she shared Douglas [name] (SO) may not be too happy with this plan. Patient responded saying that Douglas [name] " has dementia and gets hot all the time". Patient denied any concerns with the plans of initially staying with his aunt Douglas [name] and denied any concerns with Douglas [name]. Patient continued to share that he will most likely move in with his brother Douglas [name] long term as he is in process of finding a permanent home for patient and Douglas [name]'s family (wife and 49 yo son). CSW then discussed caregiver role and patient states that his sister Douglas [name] will be the caregiver although she will not be able to be the 24/7 caregiver just handling the dressing changes. Patient's friend was at bedside and stated she would be willing to stay with patient during the day time hours while his aunt Douglas [name] works and then she would be home at night for the 24/7 care needs and Douglas [name]  would be responsible for the dressing changes. CSW shared concerns of a fragmented plan for caregiver and housing but would contact all involved to confirm plan and return to bedside with patient for further discussion.   Upon leaving the room, patient's friend asked to speak with CSW and requested to remain anonymous as she shared concerns about the plan for care and "that none of this will work". She shared that she has known the patient for sometime and he has lived with both aunts and has been kicked out multiple times. She stated "they kick him out because he doesn't give them any money and just lounges around all day". "At any given moment she will tell him to get out".  He lived with Douglas [name] in the past and she out him out and he was homeless for 2.5 months before Douglas [name] allowed him to sleep on the floor. She also shared that Douglas [name] is not living with his wife and she plans to find her own place leaving [name] on his own.    CSW will follow up with all involved to further discuss a plan for caregiver and housing and verify intentions and stated concerns by friend. CSW will also refer patient to [facility] for [place] application. CSW continues to follow to assist with above social concerns. Douglas [name], [name], [name]

## 2022-11-16 NOTE — Evaluation (Signed)
Clinical/Bedside Swallow Evaluation Patient Details  Name: Douglas [name] MRN: [id number] Date of Birth: 05-Oct-1976  Today's Date: 11/16/2022 Time: SLP Start Time (ACUTE ONLY): 4132 SLP Stop Time (ACUTE ONLY): 0945 SLP Time Calculation (min) (ACUTE ONLY): 19 min  Past Medical History:  Past Medical History:  Diagnosis Date   Depression, major, single episode 01/14/2017   Diabetes mellitus ([place])    ED (erectile dysfunction)    History of syncope 01/29/2015   Hypertension    ICD (implantable cardioverter-defibrillator) discharge 11/30/2014   On 11/30/14. Asymptomatic.    Nonischemic cardiomyopathy ([place])    a.  echo 4/06: EF 30%, mild to mod MR, mild RAE, inf HK, lat HK , ant HK;    b.  cath 4/06: no CAD, EF 20-25%   NSVT (nonsustained ventricular tachycardia) (HCC)    Obesity    Systolic CHF, chronic ([place])    EF 11/17 25-30%, s/p ICD   Past Surgical History:  Past Surgical History:  Procedure Laterality Date   CARDIAC CATHETERIZATION  09/2011; 02/2013; 04/2013   CARDIAC DEFIBRILLATOR PLACEMENT  08/23/2013   IMPLANTABLE CARDIOVERTER DEFIBRILLATOR IMPLANT N/A 08/23/2013   Procedure: IMPLANTABLE CARDIOVERTER DEFIBRILLATOR IMPLANT;  Surgeon: Deboraha Sprang, MD;  Location: [facility] CATH LAB;  Service: Cardiovascular;  Laterality: N/A;   LEFT AND RIGHT HEART CATHETERIZATION WITH CORONARY ANGIOGRAM N/A 09/20/2011   Procedure: LEFT AND RIGHT HEART CATHETERIZATION WITH CORONARY ANGIOGRAM;  Surgeon: Jolaine Artist, MD;  Location: [facility] CATH LAB;  Service: Cardiovascular;  Laterality: N/A;   MITRAL VALVE REPAIR N/A 11/09/2022   Procedure: MITRAL VALVE REPAIR;  Surgeon: Sherren Mocha, MD;  Location: [place] CV LAB;  Service: Cardiovascular;  Laterality: N/A;   MULTIPLE EXTRACTIONS WITH ALVEOLOPLASTY N/A 01/26/2013   Procedure:  EXTRACION  TOOTH # 19 WITH ALVEOLOPLASTY;  Surgeon: Lenn Cal, DDS;  Location: [place];  Service: Oral Surgery;  Laterality: N/A;   RIGHT HEART CATH N/A 11/05/2022    Procedure: RIGHT HEART CATH;  Surgeon: Jolaine Artist, MD;  Location: [place] CV LAB;  Service: Cardiovascular;  Laterality: N/A;   RIGHT HEART CATHETERIZATION N/A 02/22/2013   Procedure: RIGHT HEART CATH;  Surgeon: Jolaine Artist, MD;  Location: [facility] CATH LAB;  Service: Cardiovascular;  Laterality: N/A;   RIGHT HEART CATHETERIZATION N/A 05/03/2013   Procedure: RIGHT HEART CATH;  Surgeon: Jolaine Artist, MD;  Location: [facility] CATH LAB;  Service: Cardiovascular;  Laterality: N/A;   RIGHT/LEFT HEART CATH AND CORONARY ANGIOGRAPHY N/A 03/03/2022   Procedure: RIGHT/LEFT HEART CATH AND CORONARY ANGIOGRAPHY;  Surgeon: Jolaine Artist, MD;  Location: [place] CV LAB;  Service: Cardiovascular;  Laterality: N/A;   TEE WITHOUT CARDIOVERSION N/A 10/28/2022   Procedure: TRANSESOPHAGEAL ECHOCARDIOGRAM (TEE);  Surgeon: Jolaine Artist, MD;  Location[facility] ENDOSCOPY;  Service: Cardiovascular;  Laterality: N/A;   TEE WITHOUT CARDIOVERSION N/A 11/09/2022   Procedure: TRANSESOPHAGEAL ECHOCARDIOGRAM (TEE);  Surgeon: Sherren Mocha, MD;  Location: [place] CV LAB;  Service: Cardiovascular;  Laterality: N/A;   HPI:  Pt is a 47 y.o. male admitted 10/25/22 with acute decompensated HF. TEE 12/14 with severe MR. S/p unsuccessful mTEER 12/26; post-op decompensation and increasing pulmonary edema requiring transfer to ICU. ETT 12/26-12/27. VAD team following due to failed Mitraclip. Nurse describes some intermittent difficulty swallowing. Pt reports coughing episodes that triggered vomiting vs expectoration; he states it's been ~24 hours since this last happened. PMH includes CHF, DM2, HTN, NSVT, CKD, obesity, depression, anxiety, cocaine use, tobacco use.    Assessment /  Plan / Recommendation  Clinical Impression  Pt participated in clinical swallowing assessment.  Oral mechanism exam was normal- no focal deficts; dentition present. Voice is WNL; strong cough.  Pt did not demonstrate s/s of dysphagia -  there was thorough mastication of solids. He drank three oz water with no s/s of aspiration. He consumed mixed solid/liquid consistencies with no concerns for airway instrusion. No dysphagia identified. Recommend allowing regular solids, thin liquids. If concerns for impaired swallowing recur, please feel free to consult [place] again. SLP will sign off.   SLP Visit Diagnosis: Dysphagia, unspecified (R13.10)    Aspiration Risk  No limitations    Diet Recommendation   Regular solids, thin liquids  Medication Administration: Whole meds with liquid    Other  Recommendations Oral Care Recommendations: Oral care BID    Recommendations for follow up therapy are one component of a multi-disciplinary discharge planning process, led by the attending physician.  Recommendations may be updated based on patient status, additional functional criteria and insurance authorization.  Follow up Recommendations No SLP follow up        Swallow Study   General HPI: Pt is a 47 y.o. male admitted 10/25/22 with acute decompensated HF. TEE 12/14 with severe MR. S/p unsuccessful mTEER 12/26; post-op decompensation and increasing pulmonary edema requiring transfer to ICU. ETT 12/26-12/27. VAD team following due to failed Mitraclip. Nurse describes some intermittent difficulty swallowing. Pt reports coughing episodes that triggered vomiting vs expectoration; he states it's been ~24 hours since this last happened. PMH includes CHF, DM2, HTN, NSVT, CKD, obesity, depression, anxiety, cocaine use, tobacco use. Type of Study: Bedside Swallow Evaluation Previous Swallow Assessment: no Diet Prior to this Study: Dysphagia 3 (soft);Nectar-thick liquids Temperature Spikes Noted: No Respiratory Status: Room air History of Recent Intubation: Yes Length of Intubations (days): 1 days Date extubated: 11/10/22 Behavior/Cognition: Alert;Cooperative Oral Cavity Assessment: Within Functional Limits Oral Care Completed by SLP:  No Oral Cavity - Dentition: Adequate natural dentition Vision: Functional for self-feeding Self-Feeding Abilities: Able to feed self Patient Positioning: Upright in chair Baseline Vocal Quality: Normal Volitional Cough: Strong Volitional Swallow: Able to elicit    Oral/Motor/Sensory Function Overall Oral Motor/Sensory Function: Within functional limits   Ice Chips Ice chips: Within functional limits   Thin Liquid Thin Liquid: Within functional limits    Nectar Thick Nectar Thick Liquid: Not tested   Honey Thick Honey Thick Liquid: Not tested   Puree Puree: Within functional limits   Solid     Solid: Within functional limits      Juan Quam Laurice 11/16/2022,9:48 AM  Estill Bamberg L. Tivis Ringer, MA CCC/SLP Clinical Specialist - [place] Office number [phone]

## 2022-11-16 NOTE — Progress Notes (Signed)
ANTICOAGULATION CONSULT NOTE  Pharmacy Consult for Heparin Indication: atrial fibrillation  Allergies  Allergen Reactions   Bydureon [Exenatide] Rash   Penicillins Other (See Comments)    Childhood allergy Unknown reaction    Patient Measurements: Height: 6\' 4"  (193 cm) Weight: 127.5 kg (281 lb 1.4 oz) IBW/kg (Calculated) : 86.8 Heparin Dosing Weight: 90kg   Vital Signs: Temp: 97.7 F (36.5 C) (01/02 0838) Temp Source: Oral (01/02 0838) BP: 116/95 (01/02 0700) Pulse Rate: 84 (01/02 0700)  Labs: Recent Labs    11/14/22 0112 11/14/22 0414 11/14/22 2218 11/15/22 0340 11/15/22 1802 11/16/22 0400  HGB 10.7*  --   --  10.3*  --  10.3*  HCT 31.8*  --   --  29.9*  --  31.0*  PLT 188  --   --  211  --  220  HEPARINUNFRC  --  0.28*  --  0.38  --  0.30  CREATININE 2.08*  --    < > 2.24* 2.53* 2.59*   < > = values in this interval not displayed.     Estimated Creatinine Clearance: 50.9 mL/min (A) (by C-G formula based on SCr of 2.59 mg/dL (H)).   Medical History: Past Medical History:  Diagnosis Date   Depression, major, single episode 01/14/2017   Diabetes mellitus [facility])    ED (erectile dysfunction)    History of syncope 01/29/2015   Hypertension    ICD (implantable cardioverter-defibrillator) discharge 11/30/2014   On 11/30/14. Asymptomatic.    Nonischemic cardiomyopathy ([place])    a.  echo 4/06: EF 30%, mild to mod MR, mild RAE, inf HK, lat HK , ant HK;    b.  cath 4/06: no CAD, EF 20-25%   NSVT (nonsustained ventricular tachycardia) (HCC)    Obesity    Systolic CHF, chronic (HCC)    EF 11/17 25-30%, s/p ICD      Assessment: 46yom with heart failure with new atrial fibrillation. Pharmacy asked to dose heparin drip while in ICU and decisions regarding procedures discussed.  Heparin level this morning 0.3 at goal on heparin drip rate  2350 units/hr. Hgb stable 10s  plt stable 180-200. No s/sx of bleeding or infusion issues. Continue iv anticoagulation until  procedures / DC plans decided In/out Afib, NSVT on IV amiodarone   Goal of Therapy:  Heparin level 0.3-0.7 units/ml Monitor platelets by anticoagulation protocol: Yes   Plan:  Continue  heparin infusion  2350 units/hr  Daily heparin level, CBC Monitor s/s bleeding     Bonnita Nasuti Pharm.D. CPP, BCPS Clinical Pharmacist [phone] 11/16/2022 8:50 AM   [facility] pharmacy phone numbers are listed on [email or web address]

## 2022-11-16 NOTE — Progress Notes (Signed)
Bipap not needed at this time. °

## 2022-11-16 NOTE — TOC Initial Note (Signed)
Transition of Care [facility]) - Initial/Assessment Note    Patient Details  Name: Douglas [name] MRN: [id number] Date of Birth: Aug 16, 1976  Transition of Care [facility]) CM/SW Contact:    Erenest Rasher, RN Phone Number: [phone] 11/16/2022, 2:15 PM  Clinical Narrative:                  HF TOC CM spoke to pt and states he will staying with his Douglas [name] for awhile in her home. CM explained his FMLA/Disability paperwork was given to HF Clinic RN, Nira Conn to completed. Contacted [employer], rep Pam for Home IV Milrinone. And medication is not covered by his payor, [place] [employer].   She will send information on out of pocket cost for medication if LOG (letter of guarantee) was needed.     Expected Discharge Plan: [place] Barriers to Discharge: Continued Medical Work up   Patient Goals and CMS Choice Patient states their goals for this hospitalization and ongoing recovery are:: wants to remain independent and work          Expected Discharge Plan and Services   Discharge Planning Services: CM Consult   Living arrangements for the past 2 months: Apartment                                      Prior Living Arrangements/Services Living arrangements for the past 2 months: Apartment Lives with:: Relatives Patient language and need for interpreter reviewed:: Yes Do you feel safe going back to the place where you live?: Yes      Need for Family Participation in Patient Care: No (Comment) Care giver support system in place?: Yes (comment) Current home services: DME (scale) Criminal Activity/Legal Involvement Pertinent to Current Situation/Hospitalization: No - Comment as needed  Activities of Daily Living Home Assistive Devices/Equipment: None ADL Screening (condition at time of admission) Patient's cognitive ability adequate to safely complete daily activities?: Yes Is the patient deaf or have difficulty hearing?: No Does the patient have  difficulty seeing, even when wearing glasses/contacts?: No Does the patient have difficulty concentrating, remembering, or making decisions?: No Patient able to express need for assistance with ADLs?: Yes Does the patient have difficulty dressing or bathing?: No Independently performs ADLs?: Yes (appropriate for developmental age) Does the patient have difficulty walking or climbing stairs?: Yes Weakness of Legs: None Weakness of Arms/Hands: None  Permission Sought/Granted Permission sought to share information with : Case Manager, Family Supports, PCP Permission granted to share information with : Yes, Verbal Permission Granted  Share Information with NAME: Beatriz Chancellor     Permission granted to share info w Relationship: sister  Permission granted to share info w Contact Information: [phone]  Emotional Assessment Appearance:: Appears stated age Attitude/Demeanor/Rapport: Engaged Affect (typically observed): Accepting Orientation: : Oriented to Self, Oriented to Place, Oriented to  Time, Oriented to Situation   Psych Involvement: No (comment)  Admission diagnosis:  Acute on chronic systolic CHF (congestive heart failure) (HCC) [I50.23] Acute on chronic congestive heart failure, unspecified heart failure type [facility]) [I50.9] Patient Active Problem List   Diagnosis Date Noted   Pulmonary edema cardiac cause ([place]) 11/10/2022   Nonrheumatic mitral (valve) insufficiency 10/31/2022   NSVT (nonsustained ventricular tachycardia) ([place]) 10/31/2022   Anxiety 10/31/2022   Acute on chronic systolic CHF (congestive heart failure) ([place]) 10/25/2022   Acute renal failure superimposed on stage IIIb chronic kidney disease (  [place]) 10/18/2022   Non-occlusive coronary artery disease 10/18/2022   Nonspecific syndrome suggestive of viral illness 07/26/2022   Smoker 07/26/2022   Acute embolism and thrombosis of deep vein of lower extremity, unspecified laterality ([place]) 03/31/2022   Onychomycosis  01/16/2017   Depression, major, single episode 01/14/2017   Allergic rhinitis 04/28/2015   Chest pain 04/21/2015   Prolonged Q-T interval on ECG 01/31/2015   ICD (MDT) in place 01/29/2015   Elevated troponin 01/29/2015   Obesity 01/29/2015   Healthcare maintenance 12/11/2014   Acute respiratory failure ([place]) 10/13/2013   Cocaine use 06/23/2012   Tobacco use 09/20/2011   ED (erectile dysfunction) 08/02/2011   Chronic combined systolic and diastolic congestive heart failure ([place]) 06/21/2011   Diabetes mellitus with stage 2 chronic kidney disease ([place]) 04/28/2009   Hyperlipidemia 04/28/2009   Essential hypertension 04/28/2009   PCP:  Gildardo Pounds, NP Pharmacy:   [facility] [address] [place] [address] Phone: [phone] Fax: [facility] [address] [place] [address] Phone: [phone] Fax: [phone]     Social Determinants of Health (SDOH) Social History: [place]: No Food Insecurity (11/16/2022)  Housing: Medium Risk (10/28/2022)  Transportation Needs: No Transportation Needs (10/28/2022)  Utilities: Not At Risk (10/28/2022)  Depression (PHQ2-9): Medium Risk (11/04/2022)  Financial Resource Strain: High Risk (03/01/2022)  Tobacco Use: High Risk (11/10/2022)   SDOH Interventions: Housing Interventions: [id number] Referral Transportation Interventions: Intervention Not Indicated Utilities Interventions: Intervention Not Indicated Depression Interventions/Treatment : Counseling   Readmission Risk Interventions     No data to display

## 2022-11-16 NOTE — Progress Notes (Signed)
Patient ID: Douglas [name], male   DOB: 11/30/1975, 47 y.o.   MRN: [id number]     Advanced Heart Failure Rounding Note  PCP-Cardiologist: None   Subjective:    12/26: Unsuccessful mTEER with severe residual MR d/t chordal disruption, 2 XTW clips being placed at A2/P2 position. Unable to place 3rd clip. 12/27: Extubated 12/28: AF with RVR. Converted to SR with IV amiodarone boluses. 1/1 Given Tolvaptan.   Remains on NE 3 mcg + milrinone 0.375 mcg.    Feels ok. Denies SOB.     Objective:   Weight Range: 127.5 kg Body mass index is 34.21 kg/m.   Vital Signs:   Temp:  [98.2 F (36.8 C)-98.6 F (37 C)] 98.4 F (36.9 C) (01/02 0419) Pulse Rate:  [72-107] 84 (01/02 0700) Resp:  [13-40] 23 (01/02 0700) BP: (94-127)/(64-99) 116/95 (01/02 0700) SpO2:  [92 %-100 %] 100 % (01/02 0700) Arterial Line BP: (94-158)/(57-127) 94/67 (01/02 0700) Weight:  [127.5 kg] 127.5 kg (01/02 0500) Last BM Date : 11/15/22  Weight change: Filed Weights   11/14/22 0500 11/15/22 0500 11/16/22 0500  Weight: 129.5 kg 130.3 kg 127.5 kg    Intake/Output:   Intake/Output Summary (Last 24 hours) at 11/16/2022 0801 Last data filed at 11/16/2022 0700 Gross per 24 hour  Intake 2304.48 ml  Output 3925 ml  Net -1620.52 ml    CVP 5-6  Physical Exam  General:  Sitting in the chair.  No resp difficulty HEENT: normal Neck: supple. no JVD. Carotids 2+ bilat; no bruits. No lymphadenopathy or thryomegaly appreciated. Cor: PMI nondisplaced. Regular rate & rhythm. No rubs, gallops . 2/6 MR . Lungs: clear Abdomen: soft, nontender, nondistended. No hepatosplenomegaly. No bruits or masses. Good bowel sounds. Extremities: no cyanosis, clubbing, rash, R and LLE trace edema  RUE PICC  Neuro: alert & orientedx3, cranial nerves grossly intact. moves all 4 extremities w/o difficulty. Affect pleasant  Telemetry   SR with PVCs/ short runs NSVT   Labs    CBC Recent Labs    11/15/22 0340 11/16/22 0400  WBC  10.8* 12.4*  HGB 10.3* 10.3*  HCT 29.9* 31.0*  MCV 84.0 84.9  PLT 211 852   Basic Metabolic Panel Recent Labs    11/15/22 0340 11/15/22 1802 11/16/22 0400  NA 126* 126* 131*  K 3.1* 3.5 3.4*  CL 89* 97* 94*  CO2 21* 19* 22  GLUCOSE 305* 142* 102*  BUN 48* 54* 56*  CREATININE 2.24* 2.53* 2.59*  CALCIUM 8.3* 8.6* 9.0  MG 2.0  --  2.1   Liver Function Tests No results for input(s): "AST", "ALT", "ALKPHOS", "BILITOT", "PROT", "ALBUMIN" in the last 72 hours.  No results for input(s): "LIPASE", "AMYLASE" in the last 72 hours. Cardiac Enzymes No results for input(s): "CKTOTAL", "CKMB", "CKMBINDEX", "TROPONINI" in the last 72 hours.  BNP: BNP (last 3 results) Recent Labs    10/18/22 1821 10/25/22 1442 10/28/22 1825  BNP 1,494.7* 1,428.2* 491.6*    ProBNP (last 3 results) No results for input(s): "PROBNP" in the last 8760 hours.   D-Dimer No results for input(s): "DDIMER" in the last 72 hours. Hemoglobin A1C No results for input(s): "HGBA1C" in the last 72 hours.  Fasting Lipid Panel No results for input(s): "CHOL", "HDL", "LDLCALC", "TRIG", "CHOLHDL", "LDLDIRECT" in the last 72 hours.  Thyroid Function Tests No results for input(s): "TSH", "T4TOTAL", "T3FREE", "THYROIDAB" in the last 72 hours.  Invalid input(s): "FREET3"   Other results:   Imaging    No results  found.   Medications:     Scheduled Medications:  albuterol  2.5 mg Nebulization Once   arformoterol  15 mcg Nebulization BID   aspirin EC  81 mg Oral Daily   atorvastatin  80 mg Oral Daily   Chlorhexidine Gluconate Cloth  6 each Topical Daily   docusate sodium  100 mg Oral BID   furosemide  80 mg Intravenous BID   insulin aspart  0-20 Units Subcutaneous TID WC   insulin aspart  0-5 Units Subcutaneous QHS   insulin glargine-yfgn  20 Units Subcutaneous Daily   multivitamin with minerals  1 tablet Oral Daily   pantoprazole  40 mg Oral Daily   potassium chloride  40 mEq Oral Q4H    revefenacin  175 mcg Nebulization Daily   sodium chloride flush  3 mL Intravenous Q12H    Infusions:  amiodarone 60 mg/hr (11/16/22 0700)   heparin 2,350 Units/hr (11/16/22 0700)   milrinone 0.375 mcg/kg/min (11/16/22 0700)   norepinephrine (LEVOPHED) Adult infusion 3 mcg/min (11/16/22 0700)    PRN Medications: acetaminophen, albuterol, ALPRAZolam, guaiFENesin, melatonin, mouth rinse, prochlorperazine, sodium chloride flush    Patient Profile   Douglas [name] is a 47 year old with a history of HTN, hyperlipidemia, smoker, cocaine abuse,NSVT,  NICM, Medtronic ICD, and chronic HFrEF. Last used cocaine about a week ago.  EF has been down for some time.    Admitted recently with A/C HFrEF. Suspect he had residual congestion when discharged.  Readmitted 12/11 with A/C HFrEF.  Assessment/Plan   1. A/C HFrEF  - NICM. Has Medtronic ICD. Had [place] earlier this year with nonobstructive CAD.   - Admit early December. Suspect still had volume on board at discharge + increased fluid intake leading to readmission several days later. - NYHA IV on admit - TEE this admit EF 20%, severe central MR - RHC on 11/05/22 RA 2 PCW 33 (v = 48) CI ~2.0  - s/p unsuccessful MitraClip secondary to severe residual MR with torn lateral chordae tendonae - Echo 12/27: EF 20-25%, RV moderately reduced, moderate to severe residual MR - Co-ox 73% today on NE 3 + milrinone 0.375.  - CVP 5-6. Hold IV lasix today.   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 This may be the last opportunity to consider advanced therapies including VAD. Initially turned down for VAD due to limited social support (basically homeless), renal dysfunction, and substance abuse. Planning to reevaluate options with the team now that he had unsuccessful MitraClip. Will need improved creatinine <2 before re-consideration. If we cannot get him fully diuresed and creatinine < 2, will  need to see if he would be candidate for home milrinone to see if we could get renal function stabilized over time.   2. AKI on CKD Stage IIIb - Creatinine on admit 2. Baseline previously ~ 1.6.  - Cr up to 3 on 12/28 and had gone back down but now trending back up to 2.6.  - Dual inotropes.  - ? D/t episodes of hypotension +/- low-output - Diuretics as above. - Continue inotropic support.    3. Substance Abuse - Last used cocaine about a week PTA. - Discussed cessation.  - He is motivated to quit  4. CAD - LHC 02/2022 nonobstructive CAD - LDL 124  - Continue atorvastatin    5. DMII -On SSI -Holding Farxiga for now   6. HTN  - Stable.  7. NSVT - Previously on amio but he stopped in April of this year.  I am not sure why he stopped.  - Frequent burst of NSVT noted on device since the beginning of December.  - Remains on NSVT.  - Continue amiodarone 30 mg/hr.  - Occasional PVCs on telemetry.  - Keep K > 4.0 Mg > 2.0  - Off Coreg with low-output   8. Obesity  - Body mass index is 34.21 kg/m.   9. MR  - TEE 12/14: EF 20%, Severe central MR - S/p unsuccessful mTEER 12/26 as above with residual severe MR  10. Mediastinal lymph nodes, GGO RUL - Noted on preVAD CT - Will need repeat imaging in 3-6 months to follow  11. Urinary retention - Now has foley - Can reevaluate need today, discussed with TRN  12. Atrial fibrillation - Afib with RVR 12/28 - Converted to SR with IV amio boluses - Maintaining SR on amiodarone gtt with periodic AF runs.  - On heparin gtt  13. Hypontremia: Hypervolemic hyponatremia.  - Given Tolvaptan 1/1. Sodium up to 131.    SDOH: -Has medicaid -Works part-time at [employer].  -Has been turned down for VAD due primarily due to limited social support. Now reconsidering if/when renal function improves. His brother has voiced wanting to take on role of caregiver.  Holding diuretics today. Check BMET in AM.    Darrick Grinder, NP  8:01 AM

## 2022-11-17 DIAGNOSIS — N1832 Chronic kidney disease, stage 3b: Secondary | ICD-10-CM | POA: Diagnosis not present

## 2022-11-17 DIAGNOSIS — N179 Acute kidney failure, unspecified: Secondary | ICD-10-CM | POA: Diagnosis not present

## 2022-11-17 DIAGNOSIS — I472 Ventricular tachycardia, unspecified: Secondary | ICD-10-CM | POA: Diagnosis not present

## 2022-11-17 DIAGNOSIS — I5023 Acute on chronic systolic (congestive) heart failure: Secondary | ICD-10-CM | POA: Diagnosis not present

## 2022-11-17 LAB — HEPATIC FUNCTION PANEL
ALT: 33 U/L (ref 0–44)
AST: 23 U/L (ref 15–41)
Albumin: 2.6 g/dL — ABNORMAL LOW (ref 3.5–5.0)
Alkaline Phosphatase: 74 U/L (ref 38–126)
Bilirubin, Direct: 0.4 mg/dL — ABNORMAL HIGH (ref 0.0–0.2)
Indirect Bilirubin: 0.9 mg/dL (ref 0.3–0.9)
Total Bilirubin: 1.3 mg/dL — ABNORMAL HIGH (ref 0.3–1.2)
Total Protein: 6.4 g/dL — ABNORMAL LOW (ref 6.5–8.1)

## 2022-11-17 LAB — BASIC METABOLIC PANEL
Anion gap: 14 (ref 5–15)
Anion gap: 16 — ABNORMAL HIGH (ref 5–15)
BUN: 56 mg/dL — ABNORMAL HIGH (ref 6–20)
BUN: 59 mg/dL — ABNORMAL HIGH (ref 6–20)
CO2: 19 mmol/L — ABNORMAL LOW (ref 22–32)
CO2: 20 mmol/L — ABNORMAL LOW (ref 22–32)
Calcium: 8.1 mg/dL — ABNORMAL LOW (ref 8.9–10.3)
Calcium: 8.5 mg/dL — ABNORMAL LOW (ref 8.9–10.3)
Chloride: 88 mmol/L — ABNORMAL LOW (ref 98–111)
Chloride: 91 mmol/L — ABNORMAL LOW (ref 98–111)
Creatinine, Ser: 2.58 mg/dL — ABNORMAL HIGH (ref 0.61–1.24)
Creatinine, Ser: 2.85 mg/dL — ABNORMAL HIGH (ref 0.61–1.24)
GFR, Estimated: 30 mL/min — ABNORMAL LOW (ref >60)
GFR, Estimated: 26 mL/min — ABNORMAL LOW (ref >60)
Glucose, Bld: 450 mg/dL — ABNORMAL HIGH (ref 70–99)
Glucose, Bld: 314 mg/dL — ABNORMAL HIGH (ref 70–99)
Potassium: 3.5 mmol/L (ref 3.5–5.1)
Potassium: 3.5 mmol/L (ref 3.5–5.1)
Sodium: 121 mmol/L — ABNORMAL LOW (ref 135–145)
Sodium: 127 mmol/L — ABNORMAL LOW (ref 135–145)

## 2022-11-17 LAB — CBC
HCT: 29.2 % — ABNORMAL LOW (ref 39.0–52.0)
Hemoglobin: 10.2 g/dL — ABNORMAL LOW (ref 13.0–17.0)
MCH: 29 pg (ref 26.0–34.0)
MCHC: 34.9 g/dL (ref 30.0–36.0)
MCV: 83 fL (ref 80.0–100.0)
Platelets: 142 10*3/uL — ABNORMAL LOW (ref 150–400)
RBC: 3.52 MIL/uL — ABNORMAL LOW (ref 4.22–5.81)
RDW: 13.8 % (ref 11.5–15.5)
WBC: 10.2 10*3/uL (ref 4.0–10.5)
nRBC: 0.4 % — ABNORMAL HIGH (ref 0.0–0.2)

## 2022-11-17 LAB — COOXEMETRY PANEL
Carboxyhemoglobin: 2.7 % — ABNORMAL HIGH (ref 0.5–1.5)
Methemoglobin: <0.7 % (ref 0.0–1.5)
O2 Saturation: 75.6 %
Total hemoglobin: 10.5 g/dL — ABNORMAL LOW (ref 12.0–16.0)

## 2022-11-17 LAB — GLUCOSE, CAPILLARY
Glucose-Capillary: 124 mg/dL — ABNORMAL HIGH (ref 70–99)
Glucose-Capillary: 99 mg/dL (ref 70–99)
Glucose-Capillary: 141 mg/dL — ABNORMAL HIGH (ref 70–99)
Glucose-Capillary: 217 mg/dL — ABNORMAL HIGH (ref 70–99)

## 2022-11-17 LAB — HEPARIN LEVEL (UNFRACTIONATED): Heparin Unfractionated: 0.41 IU/mL (ref 0.30–0.70)

## 2022-11-17 MED ORDER — ACETAMINOPHEN 325 MG PO TABS
650.0000 mg | ORAL_TABLET | ORAL | Status: DC | PRN
  Administered 2022-11-18 – 2022-11-19 (×3): 650 mg via ORAL
  Filled 2022-11-17 (×3): qty 2

## 2022-11-17 MED ORDER — ALPRAZOLAM 0.5 MG PO TABS
0.5000 mg | ORAL_TABLET | Freq: Two times a day (BID) | ORAL | Status: DC | PRN
  Administered 2022-11-18 (×2): 0.5 mg via ORAL
  Filled 2022-11-17 (×2): qty 1

## 2022-11-17 MED ORDER — MELATONIN 5 MG PO TABS
10.0000 mg | ORAL_TABLET | Freq: Every evening | ORAL | Status: DC | PRN
  Administered 2022-11-18: 10 mg via ORAL
  Filled 2022-11-17: qty 2

## 2022-11-17 MED ORDER — TOLVAPTAN 15 MG PO TABS
15.0000 mg | ORAL_TABLET | Freq: Once | ORAL | Status: DC
  Filled 2022-11-17: qty 1

## 2022-11-17 MED ORDER — POTASSIUM CHLORIDE CRYS ER 20 MEQ PO TBCR
20.0000 meq | EXTENDED_RELEASE_TABLET | Freq: Once | ORAL | Status: AC
  Administered 2022-11-17: 20 meq via ORAL
  Filled 2022-11-17: qty 1

## 2022-11-17 NOTE — Progress Notes (Signed)
Physical Therapy Treatment Patient Details Name: Douglas [name] MRN: [id number] DOB: 05/13/1976 Today's Date: 11/17/2022   History of Present Illness Pt is a 47 y.o. male admitted 10/25/22 with acute decompensated HF. TEE 12/14 with severe MR. S/p unsuccessful mTEER 12/26; post-op decompensation and increasing pulmonary edema requiring transfer to ICU. ETT 12/26-12/27. VAD team following due to failed Mitraclip. PMH includes CHF, DM2, HTN, NSVT, CKD, obesity, depression, anxiety, cocaine use, tobacco use.    PT Comments    Pt tearful upon entry with aunt present in room; provided emotional support. Pt agreeable to participate in therapy session. Able to participate in seated warm up exercise and ambulating 200 ft with an [name] walker. VSS on RA. Will continue to follow acutely.    Recommendations for follow up therapy are one component of a multi-disciplinary discharge planning process, led by the attending physician.  Recommendations may be updated based on patient status, additional functional criteria and insurance authorization.  Follow Up Recommendations  Other (comment) (OP Cardiac Rehab)     Assistance Recommended at Discharge PRN  Patient can return home with the following Assistance with cooking/housework;Assist for transportation   Equipment Recommendations  Rollator (4 wheels)    Recommendations for Other Services       Precautions / Restrictions Precautions Precautions: Fall Restrictions Weight Bearing Restrictions: No     Mobility  Bed Mobility               General bed mobility comments: OOB in chair    Transfers Overall transfer level: Needs assistance Equipment used:  [name] walker) Transfers: Sit to/from Stand Sit to Stand: Min assist           General transfer comment: MinA from low surface of chair    Ambulation/Gait Ambulation/Gait assistance: Min guard Gait Distance (Feet): 200 Feet Assistive device: [name] Gait  Pattern/deviations: Step-through pattern, Decreased stride length Gait velocity: decreased Gait velocity interpretation: <1.8 ft/sec, indicate of risk for recurrent falls   General Gait Details: Slow and steady pace   Chief Strategy Officer    Modified Rankin (Stroke Patients Only)       Balance Overall balance assessment: Needs assistance Sitting-balance support: No upper extremity supported, Feet supported Sitting balance-Leahy Scale: Good     Standing balance support: No upper extremity supported, Bilateral upper extremity supported, During functional activity Standing balance-Leahy Scale: Fair                              Cognition Arousal/Alertness: Awake/alert Behavior During Therapy: Flat affect Overall Cognitive Status: Within Functional Limits for tasks assessed                                          Exercises General Exercises - Lower Extremity Long Arc Quad: Both, 10 reps, Seated Toe Raises: Both, 10 reps, Seated Other Exercises Other Exercises: Sitting: manual hamstring and gastroc stretch bilat    General Comments        Pertinent Vitals/Pain Pain Assessment Pain Assessment: Faces Faces Pain Scale: No hurt    Home Living                          Prior Function  PT Goals (current goals can now be found in the care plan section) Acute Rehab PT Goals Patient Stated Goal: get better, independence Potential to Achieve Goals: Good Progress towards PT goals: Progressing toward goals    Frequency    Min 3X/week      PT Plan Current plan remains appropriate    Co-evaluation              AM-PAC PT "6 Clicks" Mobility   Outcome Measure  Help needed turning from your back to your side while in a flat bed without using bedrails?: None Help needed moving from lying on your back to sitting on the side of a flat bed without using bedrails?: A Little Help needed  moving to and from a bed to a chair (including a wheelchair)?: A Little Help needed standing up from a chair using your arms (e.g., wheelchair or bedside chair)?: A Little Help needed to walk in hospital room?: A Little Help needed climbing 3-5 steps with a railing? : A Little 6 Click Score: 19    End of Session   Activity Tolerance: Patient tolerated treatment well Patient left: in chair;with call bell/phone within reach Nurse Communication: Mobility status PT Visit Diagnosis: Muscle weakness (generalized) (M62.81);Difficulty in walking, not elsewhere classified (R26.2)     Time: 9509-3267 PT Time Calculation (min) (ACUTE ONLY): 33 min  Charges:  $Therapeutic Activity: 23-37 mins                     Douglas [name], PT, DPT Acute Rehabilitation Services Office [phone]    Douglas [name] 11/17/2022, 5:18 PM

## 2022-11-17 NOTE — Progress Notes (Signed)
Patient ID: Douglas [name], male   DOB: 1976-05-03, 47 y.o.   MRN: [id number]     Advanced Heart Failure Rounding Note  PCP-Cardiologist: None   Subjective:    12/26: Unsuccessful mTEER with severe residual MR d/t chordal disruption, 2 XTW clips being placed at A2/P2 position. Unable to place 3rd clip. 12/27: Extubated 12/28: AF with RVR. Converted to SR with IV amiodarone boluses. 1/1 Given Tolvaptan.  1/2 Diuretics held. CVP down.     Remains on milrinone 0.375 mcg. CO-OX 76%.  Sodium down to 121.    Drowsy today.   Objective:   Weight Range: 129.3 kg Body mass index is 34.7 kg/m.   Vital Signs:   Temp:  [97.7 F (36.5 C)-98.4 F (36.9 C)] 98.3 F (36.8 C) (01/03 0400) Pulse Rate:  [79-110] 84 (01/03 0745) Resp:  [14-31] 22 (01/03 0745) BP: (99-130)/(62-95) 119/81 (01/03 0700) SpO2:  [94 %-100 %] 100 % (01/03 0745) Arterial Line BP: (95-150)/(54-118) 122/118 (01/02 1630) Weight:  [129.3 kg] 129.3 kg (01/03 0500) Last BM Date : 11/17/22  Weight change: Filed Weights   11/15/22 0500 11/16/22 0500 11/17/22 0500  Weight: 130.3 kg 127.5 kg 129.3 kg    Intake/Output:   Intake/Output Summary (Last 24 hours) at 11/17/2022 0752 Last data filed at 11/17/2022 0700 Gross per 24 hour  Intake 2481.39 ml  Output 1550 ml  Net 931.39 ml    CVP 6-7 in the chair.  Physical Exam  General: Appear weak. Drowsy. No resp difficulty HEENT: normal Neck: supple. JVP 6-7 . Carotids 2+ bilat; no bruits. No lymphadenopathy or thryomegaly appreciated. Cor: PMI nondisplaced. Regular rate & rhythm. No rubs, gallops. + 2/6 SEM    Lungs: clear Abdomen: soft, nontender, nondistended. No hepatosplenomegaly. No bruits or masses. Good bowel sounds. Extremities: no cyanosis, clubbing, rash, edema. RUE PICC  Neuro: alert & orientedx3, cranial nerves grossly intact. moves all 4 extremities w/o difficulty. Affect flat  Telemetry  SR with NSVT /PVCs personally checked.    Labs     CBC Recent Labs    11/16/22 0400 11/17/22 0500  WBC 12.4* 10.2  HGB 10.3* 10.2*  HCT 31.0* 29.2*  MCV 84.9 83.0  PLT 220 142*   Basic Metabolic Panel Recent Labs    53/64/68 0340 11/15/22 1802 11/16/22 0400 11/17/22 0500  NA 126*   < > 131* 121*  K 3.1*   < > 3.4* 3.5  CL 89*   < > 94* 88*  CO2 21*   < > 22 19*  GLUCOSE 305*   < > 102* 450*  BUN 48*   < > 56* 56*  CREATININE 2.24*   < > 2.59* 2.58*  CALCIUM 8.3*   < > 9.0 8.1*  MG 2.0  --  2.1  --    < > = values in this interval not displayed.   Liver Function Tests No results for input(s): "AST", "ALT", "ALKPHOS", "BILITOT", "PROT", "ALBUMIN" in the last 72 hours.  No results for input(s): "LIPASE", "AMYLASE" in the last 72 hours. Cardiac Enzymes No results for input(s): "CKTOTAL", "CKMB", "CKMBINDEX", "TROPONINI" in the last 72 hours.  BNP: BNP (last 3 results) Recent Labs    10/18/22 1821 10/25/22 1442 10/28/22 1825  BNP 1,494.7* 1,428.2* 491.6*    ProBNP (last 3 results) No results for input(s): "PROBNP" in the last 8760 hours.   D-Dimer No results for input(s): "DDIMER" in the last 72 hours. Hemoglobin A1C No results for input(s): "HGBA1C" in the last 72  hours.  Fasting Lipid Panel No results for input(s): "CHOL", "HDL", "LDLCALC", "TRIG", "CHOLHDL", "LDLDIRECT" in the last 72 hours.  Thyroid Function Tests No results for input(s): "TSH", "T4TOTAL", "T3FREE", "THYROIDAB" in the last 72 hours.  Invalid input(s): "FREET3"   Other results:   Imaging    No results found.   Medications:     Scheduled Medications:  albuterol  2.5 mg Nebulization Once   arformoterol  15 mcg Nebulization BID   aspirin EC  81 mg Oral Daily   atorvastatin  80 mg Oral Daily   Chlorhexidine Gluconate Cloth  6 each Topical Daily   docusate sodium  100 mg Oral BID   insulin aspart  0-20 Units Subcutaneous TID WC   insulin aspart  0-5 Units Subcutaneous QHS   insulin glargine-yfgn  20 Units Subcutaneous  Daily   multivitamin with minerals  1 tablet Oral Daily   pantoprazole  40 mg Oral Daily   revefenacin  175 mcg Nebulization Daily   sodium chloride flush  3 mL Intravenous Q12H    Infusions:  amiodarone 60 mg/hr (11/17/22 0700)   heparin 2,350 Units/hr (11/17/22 0700)   milrinone 0.375 mcg/kg/min (11/17/22 0700)   norepinephrine (LEVOPHED) Adult infusion Stopped (11/16/22 1046)    PRN Medications: acetaminophen, albuterol, ALPRAZolam, guaiFENesin, melatonin, mouth rinse, prochlorperazine, sodium chloride flush    Patient Profile   Douglas [name] is a 47 year old with a history of HTN, hyperlipidemia, smoker, cocaine abuse,NSVT,  NICM, Medtronic ICD, and chronic HFrEF. Last used cocaine about a week ago.  EF has been down for some time.    Admitted recently with A/C HFrEF. Suspect he had residual congestion when discharged.  Readmitted 12/11 with A/C HFrEF.  Assessment/Plan   1. A/C HFrEF  - NICM. Has Medtronic ICD. Had [place] earlier this year with nonobstructive CAD.   - Admit early December. Suspect still had volume on board at discharge + increased fluid intake leading to readmission several days later. - NYHA IV on admit - TEE this admit EF 20%, severe central MR - RHC on 11/05/22 RA 2 PCW 33 (v = 48) CI ~2.0  - s/p unsuccessful MitraClip secondary to severe residual MR with torn lateral chordae tendonae - Echo 12/27: EF 20-25%, RV moderately reduced, moderate to severe residual MR - Off Norepi. SBP stable. Co-ox 73% on  milrinone 0.375.  - CVP 5-6. Hold IV lasix today. Sodium 121 today. Give tolvaptan.   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 This may be the last opportunity to consider advanced therapies including VAD. Initially turned down for VAD due to limited social support (basically homeless), renal dysfunction, and substance abuse. Planning to reevaluate options with the team now that he had  unsuccessful MitraClip. Will need improved creatinine <2 before re-consideration. If we cannot get him fully diuresed and creatinine < 2, will need to see if he would be candidate for home milrinone to see if we could get renal function stabilized over time.   2. AKI on CKD Stage IIIb - Creatinine on admit 2. Baseline previously ~ 1.6.  - Cr up to 3 on 12/28 and had gone back down but now 2.6   - ? D/t episodes of hypotension +/- low-output - Diuretics as above. - Continue inotropic support.    3. Substance Abuse - Last used cocaine about a week PTA. - Discussed cessation.  - He is motivated to quit  4.  CAD - LHC 02/2022 nonobstructive CAD - LDL 124  - Continue atorvastatin    5. DMII -On SSI - Repeat BMET now. Glucose 450 but does not match trends. CBGs 120-130s.  -Holding [place] for now   6. HTN  - Stable.    7. NSVT - Previously on amio but he stopped in April of this year.  I am not sure why he stopped.  - Frequent burst of NSVT noted on device since the beginning of December.  - Remains on NSVT.  - Cut  back amio to 30 mg per hour.   - Occasional PVCs on telemetry.  - Keep K > 4.0 Mg > 2.0  - Off Coreg with low-output   8. Obesity  - Body mass index is 34.7 kg/m.   9. MR  - TEE 12/14: EF 20%, Severe central MR - S/p unsuccessful mTEER 12/26 as above with residual severe MR  10. Mediastinal lymph nodes, GGO RUL - Noted on preVAD CT - Will need repeat imaging in 3-6 months to follow  11. Urinary retention Resolved.   12. Atrial fibrillation - Afib with RVR 12/28 - Converted to SR with IV amio boluses - Maintaining SR. Cut back amio 30 mg per hour.   - On heparin gtt  13. Hypontremia: Hypervolemic hyponatremia.  - Given Tolvaptan 1/1. - Down to 121 this morning. Repeat BMET now. Given concern for lab error. If sodium accurate will need tolvaptan.    SDOH: -Has medicaid -Works part-time at [employer].  -Has been turned down for VAD due primarily due to  limited social support. Now reconsidering if/when renal function improves. His brother has voiced wanting to take on role of caregiver.  Repeat BMET now. TOC CM assess for possible home milrinone.   Darrick Grinder, NP  7:52 AM

## 2022-11-17 NOTE — Progress Notes (Signed)
ANTICOAGULATION CONSULT NOTE  Pharmacy Consult for Heparin Indication: atrial fibrillation  Allergies  Allergen Reactions   Bydureon [Exenatide] Rash   Penicillins Other (See Comments)    Childhood allergy Unknown reaction    Patient Measurements: Height: 6\' 4"  (193 cm) Weight: 129.3 kg (285 lb 0.9 oz) IBW/kg (Calculated) : 86.8 Heparin Dosing Weight: 90kg   Vital Signs: Temp: 98.3 F (36.8 C) (01/03 0400) Temp Source: Oral (01/03 0400) BP: 119/81 (01/03 0700) Pulse Rate: 84 (01/03 0745)  Labs: Recent Labs    11/15/22 0340 11/15/22 1802 11/16/22 0400 11/17/22 0500  HGB 10.3*  --  10.3* 10.2*  HCT 29.9*  --  31.0* 29.2*  PLT 211  --  220 142*  HEPARINUNFRC 0.38  --  0.30 0.41  CREATININE 2.24* 2.53* 2.59* 2.58*     Estimated Creatinine Clearance: 51.4 mL/min (A) (by C-G formula based on SCr of 2.58 mg/dL (H)).   Medical History: Past Medical History:  Diagnosis Date   Depression, major, single episode 01/14/2017   Diabetes mellitus [facility])    ED (erectile dysfunction)    History of syncope 01/29/2015   Hypertension    ICD (implantable cardioverter-defibrillator) discharge 11/30/2014   On 11/30/14. Asymptomatic.    Nonischemic cardiomyopathy ([place])    a.  echo 4/06: EF 30%, mild to mod MR, mild RAE, inf HK, lat HK , ant HK;    b.  cath 4/06: no CAD, EF 20-25%   NSVT (nonsustained ventricular tachycardia) (HCC)    Obesity    Systolic CHF, chronic (HCC)    EF 11/17 25-30%, s/p ICD      Assessment: 46yom with heart failure with new atrial fibrillation. Pharmacy asked to dose heparin drip while in ICU and decisions regarding procedures discussed.  Heparin level this morning 0.41 at goal on heparin drip rate 2350 units/hr. Hgb stable 10s  plt stable. No s/sx of bleeding or infusion issues. Continue iv anticoagulation until procedures / DC plans decided In/out Afib, NSVT on IV amiodarone   Goal of Therapy:  Heparin level 0.3-0.7 units/ml Monitor platelets by  anticoagulation protocol: Yes   Plan:  Continue  heparin infusion 2350 units/hr  Daily heparin level, CBC Monitor s/s bleeding    [place] Crane, Vena [place], BCPS, [facility] Clinical Pharmacist  11/17/2022 8:12 AM   [facility] pharmacy phone numbers are listed on [email or web address]

## 2022-11-17 NOTE — Progress Notes (Signed)
CSW met briefly with patient at bedside. Patient had just returned from a walk with PT and was quite fatigued. CSW shared that a message was left with his brother Pleas [facility]) regarding plan for housing per patient request. Patient shared that his [place] Ponder visited earlier today.   CSW spoke with patient's sister Devona via phone who shared that she had a seizure and has been struggling over the past few weeks with repeated seizures. She was in route for an MRI and would call back tomorrow.   CSW will follow up with family tomorrow. Raquel Sarna, [place], [place]

## 2022-11-17 NOTE — Progress Notes (Signed)
PT Cancellation Note  Patient Details Name: Douglas [name] MRN: [id number] DOB: May 18, 1976   Cancelled Treatment:     Increased fatigue/lethargy today and declining PT.  Wyona Almas, PT, DPT Acute Rehabilitation Services Office [phone]    Deno Etienne 11/17/2022, 1:32 PM

## 2022-11-18 ENCOUNTER — Inpatient Hospital Stay: Payer: Medicaid Other | Admitting: Physician Assistant

## 2022-11-18 DIAGNOSIS — N1832 Chronic kidney disease, stage 3b: Secondary | ICD-10-CM | POA: Diagnosis not present

## 2022-11-18 DIAGNOSIS — R0609 Other forms of dyspnea: Secondary | ICD-10-CM

## 2022-11-18 DIAGNOSIS — I472 Ventricular tachycardia, unspecified: Secondary | ICD-10-CM | POA: Diagnosis not present

## 2022-11-18 DIAGNOSIS — I5023 Acute on chronic systolic (congestive) heart failure: Secondary | ICD-10-CM | POA: Diagnosis not present

## 2022-11-18 DIAGNOSIS — J9601 Acute respiratory failure with hypoxia: Secondary | ICD-10-CM | POA: Diagnosis not present

## 2022-11-18 DIAGNOSIS — N179 Acute kidney failure, unspecified: Secondary | ICD-10-CM | POA: Diagnosis not present

## 2022-11-18 DIAGNOSIS — F419 Anxiety disorder, unspecified: Secondary | ICD-10-CM

## 2022-11-18 DIAGNOSIS — Z515 Encounter for palliative care: Secondary | ICD-10-CM | POA: Diagnosis not present

## 2022-11-18 LAB — COOXEMETRY PANEL
Carboxyhemoglobin: 2.1 % — ABNORMAL HIGH (ref 0.5–1.5)
Methemoglobin: <0.7 % (ref 0.0–1.5)
O2 Saturation: 63.1 %
Total hemoglobin: 9.9 g/dL — ABNORMAL LOW (ref 12.0–16.0)

## 2022-11-18 LAB — CBC
HCT: 28.2 % — ABNORMAL LOW (ref 39.0–52.0)
Hemoglobin: 9.5 g/dL — ABNORMAL LOW (ref 13.0–17.0)
MCH: 28.3 pg (ref 26.0–34.0)
MCHC: 33.7 g/dL (ref 30.0–36.0)
MCV: 83.9 fL (ref 80.0–100.0)
Platelets: 66 10*3/uL — ABNORMAL LOW (ref 150–400)
RBC: 3.36 MIL/uL — ABNORMAL LOW (ref 4.22–5.81)
RDW: 13.9 % (ref 11.5–15.5)
WBC: 10.8 10*3/uL — ABNORMAL HIGH (ref 4.0–10.5)
nRBC: 0.3 % — ABNORMAL HIGH (ref 0.0–0.2)

## 2022-11-18 LAB — BASIC METABOLIC PANEL
Anion gap: 14 (ref 5–15)
BUN: 66 mg/dL — ABNORMAL HIGH (ref 6–20)
CO2: 18 mmol/L — ABNORMAL LOW (ref 22–32)
Calcium: 8.3 mg/dL — ABNORMAL LOW (ref 8.9–10.3)
Chloride: 91 mmol/L — ABNORMAL LOW (ref 98–111)
Creatinine, Ser: 3.16 mg/dL — ABNORMAL HIGH (ref 0.61–1.24)
GFR, Estimated: 23 mL/min — ABNORMAL LOW (ref >60)
Glucose, Bld: 255 mg/dL — ABNORMAL HIGH (ref 70–99)
Potassium: 3.3 mmol/L — ABNORMAL LOW (ref 3.5–5.1)
Sodium: 123 mmol/L — ABNORMAL LOW (ref 135–145)

## 2022-11-18 LAB — GLUCOSE, CAPILLARY
Glucose-Capillary: 102 mg/dL — ABNORMAL HIGH (ref 70–99)
Glucose-Capillary: 136 mg/dL — ABNORMAL HIGH (ref 70–99)

## 2022-11-18 LAB — HEPARIN LEVEL (UNFRACTIONATED): Heparin Unfractionated: 0.39 IU/mL (ref 0.30–0.70)

## 2022-11-18 MED ORDER — MORPHINE SULFATE (PF) 2 MG/ML IV SOLN: 1.0000 mg | INTRAVENOUS | Status: DC | PRN

## 2022-11-18 MED ORDER — LORAZEPAM 2 MG/ML PO CONC
2.0000 mg | Freq: Four times a day (QID) | ORAL | Status: DC | PRN
  Administered 2022-11-18: 2 mg via ORAL
  Filled 2022-11-18: qty 1

## 2022-11-18 MED ORDER — POTASSIUM CHLORIDE 10 MEQ/50ML IV SOLN
10.0000 meq | INTRAVENOUS | Status: AC
  Administered 2022-11-18 (×3): 10 meq via INTRAVENOUS
  Filled 2022-11-18 (×3): qty 50

## 2022-11-18 NOTE — Progress Notes (Signed)
Patient ID: Douglas [name], male   DOB: 08-31-1976, 47 y.o.   MRN: [id number]     Advanced Heart Failure Rounding Note  PCP-Cardiologist: None   Subjective:    12/26: Unsuccessful mTEER with severe residual MR d/t chordal disruption, 2 XTW clips being placed at A2/P2 position. Unable to place 3rd clip. 12/27: Extubated 12/28: AF with RVR. Converted to SR with IV amiodarone boluses. 1/1 Given Tolvaptan.  1/2 Diuretics held. CVP down.     Remains on milrinone 0.375 mcg.  Sodium down to 123   Wants to go home.   Objective:   Weight Range: 123.8 kg Body mass index is 33.22 kg/m.   Vital Signs:   Temp:  [97.4 F (36.3 C)-98.8 F (37.1 C)] 98.8 F (37.1 C) (01/04 0800) Pulse Rate:  [64-91] 91 (01/04 0715) Resp:  [15-30] 16 (01/04 0819) BP: (88-132)/(67-98) 108/75 (01/04 0600) SpO2:  [93 %-100 %] 100 % (01/04 0715) Weight:  [123.8 kg] 123.8 kg (01/04 0500) Last BM Date : 11/17/22  Weight change: Filed Weights   11/16/22 0500 11/17/22 0500 11/18/22 0500  Weight: 127.5 kg 129.3 kg 123.8 kg    Intake/Output:   Intake/Output Summary (Last 24 hours) at 11/18/2022 0852 Last data filed at 11/18/2022 0559 Gross per 24 hour  Intake 1607.88 ml  Output 975 ml  Net 632.88 ml     Physical Exam  General:Drowsy. In the chair.  No resp difficulty HEENT: normal Neck: supple.JVP 8-9 . Carotids 2+ bilat; no bruits. No lymphadenopathy or thryomegaly appreciated. Cor: PMI nondisplaced. Regular rate & rhythm. No rubs, gallops or murmurs. Lungs: clear Abdomen: soft, nontender, nondistended. No hepatosplenomegaly. No bruits or masses. Good bowel sounds. Extremities: no cyanosis, clubbing, rash, edema. PICC line  Neuro: alert & orientedx3, cranial nerves grossly intact. moves all 4 extremities w/o difficulty. Affect pleasant  Telemetry  SR with NSVT/PVCs    Labs    CBC Recent Labs    11/17/22 0500 11/18/22 0433  WBC 10.2 10.8*  HGB 10.2* 9.5*  HCT 29.2* 28.2*  MCV 83.0  83.9  PLT 142* 66*   Basic Metabolic Panel Recent Labs    11/16/22 0400 11/17/22 0500 11/17/22 0810 11/18/22 0433  NA 131*   < > 127* 123*  K 3.4*   < > 3.5 3.3*  CL 94*   < > 91* 91*  CO2 22   < > 20* 18*  GLUCOSE 102*   < > 314* 255*  BUN 56*   < > 59* 66*  CREATININE 2.59*   < > 2.85* 3.16*  CALCIUM 9.0   < > 8.5* 8.3*  MG 2.1  --   --   --    < > = values in this interval not displayed.   Liver Function Tests Recent Labs    11/17/22 0810  AST 23  ALT 33  ALKPHOS 74  BILITOT 1.3*  PROT 6.4*  ALBUMIN 2.6*    No results for input(s): "LIPASE", "AMYLASE" in the last 72 hours. Cardiac Enzymes No results for input(s): "CKTOTAL", "CKMB", "CKMBINDEX", "TROPONINI" in the last 72 hours.  BNP: BNP (last 3 results) Recent Labs    10/18/22 1821 10/25/22 1442 10/28/22 1825  BNP 1,494.7* 1,428.2* 491.6*    ProBNP (last 3 results) No results for input(s): "PROBNP" in the last 8760 hours.   D-Dimer No results for input(s): "DDIMER" in the last 72 hours. Hemoglobin A1C No results for input(s): "HGBA1C" in the last 72 hours.  Fasting Lipid Panel No  results for input(s): "CHOL", "HDL", "LDLCALC", "TRIG", "CHOLHDL", "LDLDIRECT" in the last 72 hours.  Thyroid Function Tests No results for input(s): "TSH", "T4TOTAL", "T3FREE", "THYROIDAB" in the last 72 hours.  Invalid input(s): "FREET3"   Other results:   Imaging    No results found.   Medications:     Scheduled Medications:  albuterol  2.5 mg Nebulization Once   arformoterol  15 mcg Nebulization BID   aspirin EC  81 mg Oral Daily   atorvastatin  80 mg Oral Daily   Chlorhexidine Gluconate Cloth  6 each Topical Daily   docusate sodium  100 mg Oral BID   insulin aspart  0-20 Units Subcutaneous TID WC   insulin aspart  0-5 Units Subcutaneous QHS   insulin glargine-yfgn  20 Units Subcutaneous Daily   multivitamin with minerals  1 tablet Oral Daily   pantoprazole  40 mg Oral Daily   revefenacin  175  mcg Nebulization Daily   sodium chloride flush  3 mL Intravenous Q12H    Infusions:  amiodarone 30 mg/hr (11/18/22 0559)   heparin 2,350 Units/hr (11/18/22 0559)   milrinone 0.375 mcg/kg/min (11/18/22 0731)   norepinephrine (LEVOPHED) Adult infusion Stopped (11/16/22 1046)   potassium chloride 10 mEq (11/18/22 0848)    PRN Medications: acetaminophen, albuterol, ALPRAZolam, guaiFENesin, melatonin, mouth rinse, prochlorperazine, sodium chloride flush    Patient Profile   Douglas [name] is a 47 year old with a history of HTN, hyperlipidemia, smoker, cocaine abuse,NSVT,  NICM, Medtronic ICD, and chronic HFrEF. Last used cocaine about a week ago.  EF has been down for some time.    Admitted recently with A/C HFrEF. Suspect he had residual congestion when discharged.  Readmitted 12/11 with A/C HFrEF.  Assessment/Plan   1. A/C HFrEF  - NICM. Has Medtronic ICD. 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   - Admit early December. Suspect still had volume on board at discharge + increased fluid intake leading to readmission several days later. - NYHA IV on admit - TEE this admit EF 20%, severe central MR - RHC on 11/05/22 RA 2 PCW 33 (v = 48) CI ~2.0  - s/p unsuccessful MitraClip secondary to severe residual MR with torn lateral chordae tendonae - Echo 12/27: EF 20-25%, RV moderately reduced, moderate to severe residual MR - Off Norepi. SBP stable. Co-ox 63% on  milrinone 0.375.  - CVP 8.  Hold IV lasix today. Sodium 123 today. Give tolvaptan.   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 This may be the last opportunity to consider advanced therapies including VAD. Initially turned down for VAD due to limited social support (basically homeless), renal dysfunction, and substance abuse. Planning to reevaluate options with the team now that he had unsuccessful MitraClip. Will need improved creatinine <2 before  re-consideration. If we cannot get him fully diuresed and creatinine < 2, will need to see if he would be candidate for home milrinone to see if we could get renal function stabilized over time.   2. AKI on CKD Stage IIIb - Creatinine on admit 2. Baseline previously ~ 1.6.  - Cr up to 3 on 12/28 and had gone back down but continues to rise.    - ? D/t episodes of hypotension +/- low-output - Diuretics as above. - Continue inotropic support.    3. Substance Abuse - Last used cocaine about a week PTA. - Discussed cessation.  - He is  motivated to quit  4. CAD - LHC 02/2022 nonobstructive CAD - LDL 124  - Continue atorvastatin    5. DMII -On SSI - Repeat BMET now. Glucose 450 but does not match trends. CBGs 120-130s.  -Holding [place] for now   6. HTN  - Stable.    7. NSVT - Previously on amio but he stopped in April of this year.  I am not sure why he stopped.  - Frequent burst of NSVT noted on device since the beginning of December.  - Remains on Amio  - Continue amio 30 mg per hour. .   - Occasional PVCs on telemetry.  - Keep K > 4.0 Mg > 2.0  - Off Coreg with low-output   8. Obesity  - Body mass index is 33.22 kg/m.   9. MR  - TEE 12/14: EF 20%, Severe central MR - S/p unsuccessful mTEER 12/26 as above with residual severe MR  10. Mediastinal lymph nodes, GGO RUL - Noted on preVAD CT - Will need repeat imaging in 3-6 months to follow  11. Urinary retention Foley replaced.   12. Atrial fibrillation - Afib with RVR 12/28 - Converted to SR with IV amio boluses - Maintaining SR. Continue amio 30 mg per hour.   - On heparin gtt  13. Hypontremia: Hypervolemic hyponatremia.  - Given Tolvaptan 1/1. - Down to 123 today.   SDOH: -Has medicaid -Works part-time at [employer].  -Has been turned down for VAD due primarily due to limited social support.   End stage heart failure. He wants to go home but I am not sure that is a realistic expectation as he is on  milrinone, hypnatremic and worsening renal function. Will engage Palliative Care again to discuss [place] and help with Hospice referral. Needs family meeting to ensure everyone is on the same page. Suspect he will have limited time off inotropes and I am not sure he will survive this hospitalization. We discussed turning off his defibrillator if he elects hospice.   I asked for urgent palliative care consult. We will contact his famiy   Darrick Grinder, NP  8:52 AM

## 2022-11-18 NOTE — Progress Notes (Addendum)
Contacted [employer] support to reach local representative to turn Med[employer] AICD to off. Awaiting call back.    1154: Med[employer] AICD therapies turned off by local representative.

## 2022-11-18 NOTE — Progress Notes (Addendum)
ANTICOAGULATION CONSULT NOTE  Pharmacy Consult for Heparin Indication: atrial fibrillation  Allergies  Allergen Reactions   Bydureon [Exenatide] Rash   Penicillins Other (See Comments)    Childhood allergy Unknown reaction    Patient Measurements: Height: 6\' 4"  (193 cm) Weight: 123.8 kg (272 lb 14.9 oz) IBW/kg (Calculated) : 86.8 Heparin Dosing Weight: 90kg   Vital Signs: Temp: 98.5 F (36.9 C) (01/04 1200) Temp Source: Oral (01/04 1200) BP: 116/81 (01/04 1200) Pulse Rate: 90 (01/04 1200)  Labs: Recent Labs    11/16/22 0400 11/17/22 0500 11/17/22 0810 11/18/22 0433  HGB 10.3* 10.2*  --  9.5*  HCT 31.0* 29.2*  --  28.2*  PLT 220 142*  --  66*  HEPARINUNFRC 0.30 0.41  --  0.39  CREATININE 2.59* 2.58* 2.85* 3.16*     Estimated Creatinine Clearance: 41.1 mL/min (A) (by C-G formula based on SCr of 3.16 mg/dL (H)).   Medical History: Past Medical History:  Diagnosis Date   Depression, major, single episode 01/14/2017   Diabetes mellitus [facility])    ED (erectile dysfunction)    History of syncope 01/29/2015   Hypertension    ICD (implantable cardioverter-defibrillator) discharge 11/30/2014   On 11/30/14. Asymptomatic.    Nonischemic cardiomyopathy ([place])    a.  echo 4/06: EF 30%, mild to mod MR, mild RAE, inf HK, lat HK , ant HK;    b.  cath 4/06: no CAD, EF 20-25%   NSVT (nonsustained ventricular tachycardia) (HCC)    Obesity    Systolic CHF, chronic (HCC)    EF 11/17 25-30%, s/p ICD      Assessment: 46yom with heart failure with new atrial fibrillation. Pharmacy asked to dose heparin drip while in ICU and decisions regarding procedures discussed.  Heparin level this morning 0.39 at goal on heparin drip rate 2350 units/hr. Hgb stable 9.5  plt stable. No s/sx of bleeding or infusion issues. Continue iv anticoagulation until procedures / DC plans decided In/out Afib, NSVT on IV amiodarone   Goal of Therapy:  Heparin level 0.3-0.7 units/ml Monitor platelets by  anticoagulation protocol: Yes   Plan:  Continue heparin infusion 2350 units/hr  It appears no plans for procedures, could transition to DOAC today if full care still desired. F/u plans [id number] care   [place] Crane, Roylene Reason, [facility] Clinical Pharmacist  11/18/2022 1:12 PM   [facility] pharmacy phone numbers are listed on [email or web address]

## 2022-11-18 NOTE — Progress Notes (Addendum)
Patient ID: Douglas [name], male   DOB: [birth year], 47 y.o.   MRN: [id number]    Progress Note from the Palliative Medicine Team at [facility]   Patient Name: Douglas [name]        Date: 11/18/2022 DOB: Jun 12, 1976  Age: 47 y.o. MRN#: [id number] Attending Physician: Larey Dresser, MD Primary Care Physician: Gildardo Pounds, NP Admit Date: 10/25/2022   47 y/o male with a history of cocaine abuse, former tobacco abuse, NICM, HFrEF who presented with acute decompensated heart failure on 12/11 after being discharged from a recent admission for hypervolemia. He required inotropic support for diuresis and was tolerating GDMT vasodilators and spironolactone, farxiga. He has had episodes of NSVT this admission. He has severe MR and underwent mitral clip x 3 with decompensation and increasing pulmonary edema. Due to increasing oxygen requirements during the case he remained intubated and was transferred to the ICU.   12/11 admitted 12/14 TEE showing EF 20% with global hypokinesis, severely dilated LA, severe functional MR, moderate TR 12/26 mitral clip 12/27 hypotensive overnight. On high dose NE and still on Epi. His Co-ox looks good. CVP 8-12 11-10-22 Successfully extubated  12/28 AF with RVR  Medical records reviewed, assessed the patient, discussed with treatment team   This NP assessed patient at the bedside as a follow up for palliative medicine needs and emotional support.  Patient is alert and oriented, OOB to chair, patient reports generalized discomfort and feeling very tired.   Created space and opportunity for patient to explore his thoughts and feeling regarding current medical situation.   Douglas [name] tells me today "I am tired and ready to go on".  He verbalized his understanding of the seriousness of his current medical situation and his limited medical options to preserve quality of life.  Education offered on the difference between aggressive medical intervention path and a  palliative comfort path for this patient, at this time in this situation.  Education offered regarding the likely limited prognosis once current supportive measures are terminated.  Douglas [name] is very clear and adamant that he wishes to shift to a comfort path and allow for natural death.      Education offered on hospice benefit; philosophy and eligibility  Douglas [name] verbalizes clear understanding and again his desire to shift to a full comfort path, allowing for natural death understanding that time is limited.   In an attempt to support the family unit I spoke to the patient's sister and Lacy Duverney by phone.  Family plan to come to the hospital today to talk with palliative medicine and support Douglas [name]         Later in the afternoon I met at the bedside with large family to include patient's sister/DiBona, patient's aunt/Cheryl, patient's brother, 2 daughters, and several nieces for ongoing conversation and education regarding current medical situation  Education offered on the patient's multiple comorbidities, and associated poor prognosis,limited viable treatment options to prolong quality of life and most importantly the patient's stated wishes and desires to shift to a comfort path and allow for natural death.  All questions and concerns addressed.  In this very difficult time for this  family all were able to support patient and his personal end-of-life decisions.  Emotional support offered   Plan of Care: -  DNR/DNI- deactivate AICD -  wean Amiodarone and Milrinone to off -  no further diagnostics, labs, CBGs  -  Symptom management/ morphine, ativan-- consider gtt if prn's are not effective  -  Evaluate patient over the next 24 hours to assess for appropriate disposition   (education offered on residential hospice eligibility) -Prognosis is limited, if not a hospital death,    patient and family would prefer residential hospice for end-of-life care  Patient and family educated that  anything can happen at any time.   PMT will continue to support holistically and support patient and his family navigating  ongoing treatment option decisions, advanced directive decisions and anticipatory care needs.  Questions and concerns addressed   PMT will continue to support holistically   90  minutes--greater than 50% of the time was spent in counseling coordination of care   Wadie Lessen NP  Palliative Medicine Team Team Phone # [phone] Pager [phone]

## 2022-11-18 NOTE — Progress Notes (Signed)
CSW met at bedside with patient who shared his conversation with Palliative Care this morning. Patient states "I am tired and worn out". His 49 yo daughter arrived and appears to have basic understanding of the current medical status of patient. Patient states multiple family members were at bedside today visiting and are in the waiting area.  CSW met with family in the waiting area. Patient Douglas [name] and brother Douglas [name] and two other family members were present.The family shared some of the conversations with Palliative and appear to understand that patient is now comfort care. They acknowledged that patient also told them that he was "tired and done". CSW provided supportive intervention and will be available as needed. Raquel Sarna, [place], [place]

## 2022-11-19 DIAGNOSIS — I4729 Other ventricular tachycardia: Secondary | ICD-10-CM

## 2022-11-19 DIAGNOSIS — I5023 Acute on chronic systolic (congestive) heart failure: Secondary | ICD-10-CM | POA: Diagnosis not present

## 2022-11-19 DIAGNOSIS — F1490 Cocaine use, unspecified, uncomplicated: Secondary | ICD-10-CM | POA: Diagnosis not present

## 2022-11-19 DIAGNOSIS — Z66 Do not resuscitate: Secondary | ICD-10-CM

## 2022-11-19 DIAGNOSIS — I340 Nonrheumatic mitral (valve) insufficiency: Secondary | ICD-10-CM | POA: Diagnosis not present

## 2022-11-19 MED ORDER — FUROSEMIDE 10 MG/ML IJ SOLN
40.0000 mg | Freq: Two times a day (BID) | INTRAMUSCULAR | Status: DC | PRN
  Administered 2022-11-19: 40 mg via INTRAVENOUS
  Filled 2022-11-19: qty 4

## 2022-11-19 NOTE — TOC Initial Note (Signed)
Transition of Care [facility]) - Initial/Assessment Note    Patient Details  Name: Douglas [name] MRN: [id number] Date of Birth: 14-Apr-1976  Transition of Care [facility]) CM/SW Contact:    Erenest Rasher, RN Phone Number: [phone] 11/19/2022, 12:41 PM  Clinical Narrative:                  HF TOC CM spoke to pt at bedside. Offered choice for Residential Hospice (medicare list provided to pt and placed on chart). He agrees to [facility]	[place] [facility] [employer]. Contacted [facility] rep, Sarah with new referral.    Expected Discharge Plan: [place] Barriers to Discharge: No Barriers Identified   Patient Goals and CMS Choice Patient states their goals for this hospitalization and ongoing recovery are:: wants to remain independent and work CMS Medicare.gov Compare Post Acute Care list provided to:: Patient Choice offered to / list presented to : Patient      Expected Discharge Plan and Services   Discharge Planning Services: CM Consult Post Acute Care Choice: Residential Hospice Bed Living arrangements for the past 2 months: Apartment                           HH Arranged: RN [facility] Agency: Hospice and [place] Date [place]: 11/19/22 Time [place]: [id number] Representative spoke with at [place]: Nicholaus Corolla  Prior Living Arrangements/Services Living arrangements for the past 2 months: Apartment Lives with:: Relatives Patient language and need for interpreter reviewed:: Yes Do you feel safe going back to the place where you live?: Yes      Need for Family Participation in Patient Care: No (Comment) Care giver support system in place?: Yes (comment) Current home services: DME (scale) Criminal Activity/Legal Involvement Pertinent to Current Situation/Hospitalization: No - Comment as needed  Activities of Daily Living Home Assistive Devices/Equipment: None ADL Screening (condition at time of admission) Patient's  cognitive ability adequate to safely complete daily activities?: Yes Is the patient deaf or have difficulty hearing?: No Does the patient have difficulty seeing, even when wearing glasses/contacts?: No Does the patient have difficulty concentrating, remembering, or making decisions?: No Patient able to express need for assistance with ADLs?: Yes Does the patient have difficulty dressing or bathing?: No Independently performs ADLs?: Yes (appropriate for developmental age) Does the patient have difficulty walking or climbing stairs?: Yes Weakness of Legs: None Weakness of Arms/Hands: None  Permission Sought/Granted Permission sought to share information with : Case Manager, Family Supports, PCP Permission granted to share information with : Yes, Verbal Permission Granted  Share Information with NAME: Beatriz Chancellor     Permission granted to share info w Relationship: sister  Permission granted to share info w Contact Information: [phone]  Emotional Assessment Appearance:: Appears stated age Attitude/Demeanor/Rapport: Engaged Affect (typically observed): Accepting Orientation: : Oriented to Self, Oriented to Place, Oriented to  Time, Oriented to Situation   Psych Involvement: No (comment)  Admission diagnosis:  Acute on chronic systolic CHF (congestive heart failure) (HCC) [I50.23] Acute on chronic congestive heart failure, unspecified heart failure type [facility]) [I50.9] Patient Active Problem List   Diagnosis Date Noted   Pulmonary edema cardiac cause ([place]) 11/10/2022   Nonrheumatic mitral (valve) insufficiency 10/31/2022   NSVT (nonsustained ventricular tachycardia) ([place]) 10/31/2022   Anxiety 10/31/2022   Acute on chronic systolic CHF (congestive heart failure) ([place]) 10/25/2022   Acute renal failure superimposed on stage IIIb chronic kidney disease (  [place]) 10/18/2022   Non-occlusive coronary artery disease 10/18/2022   Nonspecific syndrome suggestive of viral illness 07/26/2022    Smoker 07/26/2022   Acute embolism and thrombosis of deep vein of lower extremity, unspecified laterality ([place]) 03/31/2022   Onychomycosis 01/16/2017   Depression, major, single episode 01/14/2017   Allergic rhinitis 04/28/2015   Chest pain 04/21/2015   Prolonged Q-T interval on ECG 01/31/2015   ICD (MDT) in place 01/29/2015   Elevated troponin 01/29/2015   Obesity 01/29/2015   Healthcare maintenance 12/11/2014   Acute respiratory failure ([place]) 10/13/2013   Cocaine use 06/23/2012   Tobacco use 09/20/2011   ED (erectile dysfunction) 08/02/2011   Chronic combined systolic and diastolic congestive heart failure ([place]) 06/21/2011   Diabetes mellitus with stage 2 chronic kidney disease ([place]) 04/28/2009   Hyperlipidemia 04/28/2009   Essential hypertension 04/28/2009   PCP:  Gildardo Pounds, NP Pharmacy:   [facility] [address] [place] [address] Phone: [phone] Fax: [facility] [address] [place] [address] Phone: [phone] Fax: [phone]     Social Determinants of Health (SDOH) Social History: [place]: No Food Insecurity (11/16/2022)  Housing: Medium Risk (10/28/2022)  Transportation Needs: No Transportation Needs (10/28/2022)  Utilities: Not At Risk (10/28/2022)  Depression (PHQ2-9): Medium Risk (11/04/2022)  Financial Resource Strain: High Risk (03/01/2022)  Tobacco Use: High Risk (11/10/2022)   SDOH Interventions: Housing Interventions: [id number] Referral Transportation Interventions: Intervention Not Indicated Utilities Interventions: Intervention Not Indicated Depression Interventions/Treatment : Counseling   Readmission Risk Interventions     No data to display

## 2022-11-19 NOTE — Progress Notes (Signed)
Patient transport arrived to unit. Patient transferred on to stretcher with 3L nasal cannula. All patient belongings and paperwork sent with patient transport. Report given to transport personnel.

## 2022-11-19 NOTE — TOC CM/SW Note (Signed)
PTAR called for transport to [facility]. [place], Heart Failure TOC CM [phone]

## 2022-11-19 NOTE — Progress Notes (Signed)
Report called to [place] at this time. Patient dyspneic with walking to and from bathroom and requests oxygen. 2L Oxygen nasal canula applied for patient comfort. Patient appears in no distress after lying down and declines PRN medication for shortness of breath at this time.

## 2022-11-19 NOTE — Progress Notes (Signed)
Nutrition Brief Note  Chart reviewed. Pt transitioned to comfort care yesterday. Diet liberalized to Regular. Noted plan for residential hospice No further nutrition interventions planned at this time.  Please re-consult as needed.   Kerman Passey MS, RDN, LDN, CNSC Registered Dietitian 3 Clinical Nutrition RD Pager and On-Call Pager Number Located in [place]

## 2022-11-19 NOTE — Progress Notes (Signed)
Patient ID: MARGARET STAGGS, male   DOB: 12/13/1975, 47 y.o.   MRN: [id number]     Advanced Heart Failure Rounding Note  PCP-Cardiologist: None   Subjective:    12/26: Unsuccessful mTEER with severe residual MR d/t chordal disruption, 2 XTW clips being placed at A2/P2 position. Unable to place 3rd clip. 12/27: Extubated 12/28: AF with RVR. Converted to SR with IV amiodarone boluses. 1/1 Given Tolvaptan.  1/2 Diuretics held. CVP down.  1/4 transitioned to comfort care. Milrinone discontinued   Awake, sitting up in chair. No current respiratory difficulty. No current pain.      Objective:   Weight Range: 123.8 kg Body mass index is 33.22 kg/m.   Vital Signs:   Temp:  [98.5 F (36.9 C)] 98.5 F (36.9 C) (01/04 1200) Pulse Rate:  [86-95] 95 (01/04 1700) Resp:  [16-31] 22 (01/05 0200) BP: (105-141)/(73-129) 107/74 (01/05 0627) SpO2:  [90 %-100 %] 100 % (01/04 1700) Last BM Date : 11/18/22  Weight change: Filed Weights   11/16/22 0500 11/17/22 0500 11/18/22 0500  Weight: 127.5 kg 129.3 kg 123.8 kg    Intake/Output:   Intake/Output Summary (Last 24 hours) at 11/19/2022 0806 Last data filed at 11/19/2022 0700 Gross per 24 hour  Intake 698.21 ml  Output 590 ml  Net 108.21 ml     Physical Exam   General:  fatigued appearing. No respiratory difficulty HEENT: normal Neck: supple. JVD 12 cm. Carotids 2+ bilat; no bruits. No lymphadenopathy or thyromegaly appreciated. Cor: PMI nondisplaced. Regular rate & rhythm. No rubs, gallops or murmurs. Lungs: decreased BS at the bases bilaterally  Abdomen: soft, nontender, nondistended. No hepatosplenomegaly. No bruits or masses. Good bowel sounds. Extremities: no cyanosis, clubbing, rash, 2+ b/l edema Neuro: alert & oriented x 3, cranial nerves grossly intact. moves all 4 extremities w/o difficulty. Affect pleasant.   Telemetry   N/a    Labs    CBC Recent Labs    11/17/22 0500 11/18/22 0433  WBC 10.2 10.8*  HGB 10.2*  9.5*  HCT 29.2* 28.2*  MCV 83.0 83.9  PLT 142* 66*   Basic Metabolic Panel Recent Labs    11/17/22 0810 11/18/22 0433  NA 127* 123*  K 3.5 3.3*  CL 91* 91*  CO2 20* 18*  GLUCOSE 314* 255*  BUN 59* 66*  CREATININE 2.85* 3.16*  CALCIUM 8.5* 8.3*   Liver Function Tests Recent Labs    11/17/22 0810  AST 23  ALT 33  ALKPHOS 74  BILITOT 1.3*  PROT 6.4*  ALBUMIN 2.6*    No results for input(s): "LIPASE", "AMYLASE" in the last 72 hours. Cardiac Enzymes No results for input(s): "CKTOTAL", "CKMB", "CKMBINDEX", "TROPONINI" in the last 72 hours.  BNP: BNP (last 3 results) Recent Labs    10/18/22 1821 10/25/22 1442 10/28/22 1825  BNP 1,494.7* 1,428.2* 491.6*    ProBNP (last 3 results) No results for input(s): "PROBNP" in the last 8760 hours.   D-Dimer No results for input(s): "DDIMER" in the last 72 hours. Hemoglobin A1C No results for input(s): "HGBA1C" in the last 72 hours.  Fasting Lipid Panel No results for input(s): "CHOL", "HDL", "LDLCALC", "TRIG", "CHOLHDL", "LDLDIRECT" in the last 72 hours.  Thyroid Function Tests No results for input(s): "TSH", "T4TOTAL", "T3FREE", "THYROIDAB" in the last 72 hours.  Invalid input(s): "FREET3"   Other results:   Imaging    No results found.   Medications:     Scheduled Medications:  albuterol  2.5 mg Nebulization Once  arformoterol  15 mcg Nebulization BID   Chlorhexidine Gluconate Cloth  6 each Topical Daily   docusate sodium  100 mg Oral BID   pantoprazole  40 mg Oral Daily   revefenacin  175 mcg Nebulization Daily   sodium chloride flush  3 mL Intravenous Q12H    Infusions:    PRN Medications: acetaminophen, albuterol, guaiFENesin, LORazepam, melatonin, morphine injection, mouth rinse, prochlorperazine, sodium chloride flush    Patient Profile   Mr. Jacome is a 47 year old with a history of HTN, hyperlipidemia, smoker, cocaine abuse,NSVT,  NICM, Medtronic ICD, and chronic HFrEF. Last used  cocaine about a week ago.  EF has been down for some time.    Admitted recently with A/C HFrEF. Suspect he had residual congestion when discharged.  Readmitted 12/11 with A/C HFrEF.  Assessment/Plan   1. A/C HFrEF  - NICM. Has Medtronic ICD. Had [place] earlier this year with nonobstructive CAD.   - Admit early December. Suspect still had volume on board at discharge + increased fluid intake leading to readmission several days later. - NYHA IV on admit - TEE this admit EF 20%, severe central MR - RHC on 11/05/22 RA 2 PCW 33 (v = 48) CI ~2.0  - s/p unsuccessful MitraClip secondary to severe residual MR with torn lateral chordae tendonae - Echo 12/27: EF 20-25%, RV moderately reduced, moderate to severe residual MR - Progressively worsening renal function with hyponatremia and thrombocytopenia consistent with terminal / end-stage heart failure. Not a candidate for advanced therapies at this time due to significant social barriers and now renal failure.  - now comfort care. Off Milrinone. Palliative care team following. Morphine PRN     2. AKI on CKD Stage IIIb - Creatinine on admit 2. Baseline previously ~ 1.6.  - Scr peaked to 3.16 this admit, cardiorenal  - now comfort care. Lab draws discontinued     3. Substance Abuse - Last used cocaine about a week PTA.   4. CAD - LHC 02/2022 nonobstructive CAD - LDL 124    5. DMII   6. HTN  - Stable.    7. NSVT - amio discontinued    8. Obesity  - Body mass index is 33.22 kg/m.   9. MR  - TEE 12/14: EF 20%, Severe central MR - S/p unsuccessful mTEER 12/26 as above with residual severe MR  10. Mediastinal lymph nodes, GGO RUL - Noted on preVAD CT  11. Urinary retention Foley replaced.   12. Atrial fibrillation  13. Hypontremia:  - Hypervolemic hyponatremia.  - down to 123 yesterday     Lyda Jester, PA-C  8:06 AM

## 2022-11-19 NOTE — Progress Notes (Addendum)
Manufacturing engineer [facility]) Hospital Liaison Note  Referral received for patient/family interest in [facility]. Chart under review by [facility] physician.   Hospice eligibility confirmed.   Bed offered and accepted for transfer to [facility]. Unit RN please call report to [phone] prior to patient leaving the unit. Please send signed DNR and paperwork with patient.   Please leave all IV access and ports in place.   Please call PTAR [phone] after notification is received that consents have been completed.   Please call with any questions or concerns. Thank you   Roselee Nova, [place] [facility] Liaison [phone]
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
## 2022-11-19 NOTE — Progress Notes (Signed)
Palliative Care Progress Note, Assessment & Plan   Patient Name: Douglas [name]       Date: 11/19/2022 DOB: 04/20/1976  Age: 47 y.o. MRN#: [id number] Attending Physician: Larey Dresser, MD Primary Care Physician: Gildardo Pounds, NP Admit Date: 10/25/2022  Subjective: Patient is OOB and sitting in the recliner. He has a visit, [name], at bedside. He has no acute c/o pain, dyspnea, discomfort. He endorses feeling sleepy but declines to move bad to bed.   HPI: 47 y/o male with a history of cocaine abuse, former tobacco abuse, NICM, HFrEF who presented with acute decompensated heart failure on 12/11 after being discharged from a recent admission for hypervolemia.  His hospital course has included inotropic support for diuresis, NSVT, attempt at mitral clip x 3 with decompensation, and increasing pulmonary edema.  Patient was intubated, extubated, and after discussion with medical team on 1/4, patient in agreement to shift to full comfort measures.    Summary of counseling/coordination of care: After reviewing the patient's chart and assessing the patient at bedside, I spoke with patient in regards to treatment plan.   Therapeutic silence and active listening provided for patient to share his thoughts and emotions regarding current medical situation.  I provided space for patient to discuss his comments to other providers in regards to wanting a second opinion.  Patient shares that he just wanted to make sure there was nothing else that could be offered.  He says he understands that there is nothing else to do.  Emotional support provided.  We discussed next steps.  Reviewed hospice at home, hospice inpatient unit, and remaining in hospital.  I attempted to elicit patient's values and goals.  He shares he  was hoping to go home, but after we discussed what home with hospice would entail, he is in agreement to be transferred eventually to a hospice IPU.  He shares he does not want to go to the hospice facility today but is open to initiating the process and learning more about inpatient hospice services.  Counseled with medical team on [facility] rounds and discussed that patient would like to transition to hospice IPU but does not want to make that move today.  TOC made aware and will offer choice of hospice agencies to patient.   Patient is in agreement to be transferred to a MedSurg floor.  We discussed that MedSurg floor will have same medication orders and will continue full comfort measures.  PMT will continue to follow and support patient and his family throughout this hospitalization.  Awaiting hospice evaluation.  Physical Exam Constitutional:      General: He is not in acute distress.    Appearance: He is obese. He is not ill-appearing.  HENT:     Head: Normocephalic.  Cardiovascular:     Rate and Rhythm: Normal rate.  Pulmonary:     Effort: Pulmonary effort is normal.  Musculoskeletal:        General: Normal range of motion.  Skin:    General: Skin is warm and dry.  Neurological:     Mental Status: He is alert and oriented to person, place, and time.  Psychiatric:  Mood and Affect: Mood normal. Mood is not anxious.        Behavior: Behavior normal. Behavior is not agitated.             Palliative Assessment/Data: 40%    Total Time 50 minutes   Thank you for allowing the Palliative Medicine Team to assist in the care of this patient.  McBaine Ilsa Iha, FNP-BC Palliative Medicine Team Team Phone # [phone]

## 2022-11-22 ENCOUNTER — Encounter (HOSPITAL_COMMUNITY): Payer: Self-pay

## 2022-11-22 ENCOUNTER — Emergency Department (HOSPITAL_COMMUNITY): Payer: Medicaid Other

## 2022-11-22 ENCOUNTER — Inpatient Hospital Stay: Payer: Self-pay

## 2022-11-22 ENCOUNTER — Other Ambulatory Visit: Payer: Self-pay

## 2022-11-22 ENCOUNTER — Inpatient Hospital Stay (HOSPITAL_COMMUNITY): Payer: Medicaid Other

## 2022-11-22 ENCOUNTER — Inpatient Hospital Stay (HOSPITAL_COMMUNITY)
Admission: EM | Admit: 2022-11-22 | Discharge: 2022-12-16 | DRG: 291 | Disposition: E | Payer: Medicaid Other | Source: Hospice | Attending: Cardiology | Admitting: Cardiology

## 2022-11-22 DIAGNOSIS — I130 Hypertensive heart and chronic kidney disease with heart failure and stage 1 through stage 4 chronic kidney disease, or unspecified chronic kidney disease: Principal | ICD-10-CM | POA: Diagnosis present

## 2022-11-22 DIAGNOSIS — R9431 Abnormal electrocardiogram [ECG] [EKG]: Secondary | ICD-10-CM

## 2022-11-22 DIAGNOSIS — N1832 Chronic kidney disease, stage 3b: Secondary | ICD-10-CM | POA: Diagnosis present

## 2022-11-22 DIAGNOSIS — F32A Depression, unspecified: Secondary | ICD-10-CM | POA: Diagnosis present

## 2022-11-22 DIAGNOSIS — I5082 Biventricular heart failure: Secondary | ICD-10-CM | POA: Diagnosis present

## 2022-11-22 DIAGNOSIS — N182 Chronic kidney disease, stage 2 (mild): Secondary | ICD-10-CM | POA: Diagnosis not present

## 2022-11-22 DIAGNOSIS — I340 Nonrheumatic mitral (valve) insufficiency: Secondary | ICD-10-CM | POA: Diagnosis present

## 2022-11-22 DIAGNOSIS — Z888 Allergy status to other drugs, medicaments and biological substances status: Secondary | ICD-10-CM

## 2022-11-22 DIAGNOSIS — I4891 Unspecified atrial fibrillation: Secondary | ICD-10-CM | POA: Diagnosis present

## 2022-11-22 DIAGNOSIS — E871 Hypo-osmolality and hyponatremia: Secondary | ICD-10-CM | POA: Diagnosis present

## 2022-11-22 DIAGNOSIS — E875 Hyperkalemia: Secondary | ICD-10-CM | POA: Diagnosis not present

## 2022-11-22 DIAGNOSIS — D649 Anemia, unspecified: Secondary | ICD-10-CM | POA: Diagnosis present

## 2022-11-22 DIAGNOSIS — G9349 Other encephalopathy: Secondary | ICD-10-CM | POA: Diagnosis present

## 2022-11-22 DIAGNOSIS — R000 Tachycardia, unspecified: Secondary | ICD-10-CM | POA: Diagnosis not present

## 2022-11-22 DIAGNOSIS — Z8249 Family history of ischemic heart disease and other diseases of the circulatory system: Secondary | ICD-10-CM

## 2022-11-22 DIAGNOSIS — I5023 Acute on chronic systolic (congestive) heart failure: Principal | ICD-10-CM | POA: Diagnosis present

## 2022-11-22 DIAGNOSIS — I110 Hypertensive heart disease with heart failure: Secondary | ICD-10-CM | POA: Diagnosis not present

## 2022-11-22 DIAGNOSIS — Z720 Tobacco use: Secondary | ICD-10-CM | POA: Diagnosis present

## 2022-11-22 DIAGNOSIS — E872 Acidosis, unspecified: Secondary | ICD-10-CM | POA: Diagnosis present

## 2022-11-22 DIAGNOSIS — E1122 Type 2 diabetes mellitus with diabetic chronic kidney disease: Secondary | ICD-10-CM | POA: Diagnosis not present

## 2022-11-22 DIAGNOSIS — R0602 Shortness of breath: Secondary | ICD-10-CM | POA: Diagnosis not present

## 2022-11-22 DIAGNOSIS — Z515 Encounter for palliative care: Secondary | ICD-10-CM | POA: Diagnosis not present

## 2022-11-22 DIAGNOSIS — F17210 Nicotine dependence, cigarettes, uncomplicated: Secondary | ICD-10-CM | POA: Diagnosis present

## 2022-11-22 DIAGNOSIS — I469 Cardiac arrest, cause unspecified: Secondary | ICD-10-CM | POA: Diagnosis not present

## 2022-11-22 DIAGNOSIS — I2510 Atherosclerotic heart disease of native coronary artery without angina pectoris: Secondary | ICD-10-CM | POA: Diagnosis present

## 2022-11-22 DIAGNOSIS — I472 Ventricular tachycardia, unspecified: Secondary | ICD-10-CM | POA: Diagnosis not present

## 2022-11-22 DIAGNOSIS — E669 Obesity, unspecified: Secondary | ICD-10-CM | POA: Diagnosis present

## 2022-11-22 DIAGNOSIS — I4729 Other ventricular tachycardia: Secondary | ICD-10-CM | POA: Diagnosis present

## 2022-11-22 DIAGNOSIS — Z6836 Body mass index (BMI) 36.0-36.9, adult: Secondary | ICD-10-CM

## 2022-11-22 DIAGNOSIS — Z1152 Encounter for screening for COVID-19: Secondary | ICD-10-CM | POA: Diagnosis not present

## 2022-11-22 DIAGNOSIS — R570 Cardiogenic shock: Secondary | ICD-10-CM | POA: Diagnosis present

## 2022-11-22 DIAGNOSIS — I428 Other cardiomyopathies: Secondary | ICD-10-CM | POA: Diagnosis present

## 2022-11-22 DIAGNOSIS — D696 Thrombocytopenia, unspecified: Secondary | ICD-10-CM | POA: Diagnosis not present

## 2022-11-22 DIAGNOSIS — I5084 End stage heart failure: Secondary | ICD-10-CM | POA: Diagnosis present

## 2022-11-22 DIAGNOSIS — R7989 Other specified abnormal findings of blood chemistry: Secondary | ICD-10-CM | POA: Diagnosis present

## 2022-11-22 DIAGNOSIS — Z66 Do not resuscitate: Secondary | ICD-10-CM | POA: Diagnosis not present

## 2022-11-22 DIAGNOSIS — Z833 Family history of diabetes mellitus: Secondary | ICD-10-CM

## 2022-11-22 DIAGNOSIS — N179 Acute kidney failure, unspecified: Secondary | ICD-10-CM | POA: Diagnosis not present

## 2022-11-22 DIAGNOSIS — K7200 Acute and subacute hepatic failure without coma: Secondary | ICD-10-CM | POA: Diagnosis present

## 2022-11-22 DIAGNOSIS — J9601 Acute respiratory failure with hypoxia: Secondary | ICD-10-CM | POA: Diagnosis present

## 2022-11-22 DIAGNOSIS — R0789 Other chest pain: Secondary | ICD-10-CM | POA: Diagnosis not present

## 2022-11-22 DIAGNOSIS — Z95810 Presence of automatic (implantable) cardiac defibrillator: Secondary | ICD-10-CM

## 2022-11-22 DIAGNOSIS — Z79899 Other long term (current) drug therapy: Secondary | ICD-10-CM

## 2022-11-22 DIAGNOSIS — Z880 Allergy status to penicillin: Secondary | ICD-10-CM

## 2022-11-22 DIAGNOSIS — I10 Essential (primary) hypertension: Secondary | ICD-10-CM | POA: Diagnosis present

## 2022-11-22 DIAGNOSIS — F1490 Cocaine use, unspecified, uncomplicated: Secondary | ICD-10-CM | POA: Diagnosis present

## 2022-11-22 DIAGNOSIS — J811 Chronic pulmonary edema: Secondary | ICD-10-CM | POA: Diagnosis not present

## 2022-11-22 DIAGNOSIS — R079 Chest pain, unspecified: Secondary | ICD-10-CM | POA: Diagnosis not present

## 2022-11-22 DIAGNOSIS — D72829 Elevated white blood cell count, unspecified: Secondary | ICD-10-CM | POA: Diagnosis present

## 2022-11-22 DIAGNOSIS — J9600 Acute respiratory failure, unspecified whether with hypoxia or hypercapnia: Secondary | ICD-10-CM | POA: Diagnosis present

## 2022-11-22 DIAGNOSIS — E785 Hyperlipidemia, unspecified: Secondary | ICD-10-CM | POA: Diagnosis present

## 2022-11-22 DIAGNOSIS — R0902 Hypoxemia: Secondary | ICD-10-CM | POA: Diagnosis not present

## 2022-11-22 LAB — CBC WITH DIFFERENTIAL/PLATELET
Abs Immature Granulocytes: 0.61 10*3/uL — ABNORMAL HIGH (ref 0.00–0.07)
Abs Immature Granulocytes: 0 10*3/uL (ref 0.00–0.07)
Basophils Absolute: 0.1 10*3/uL (ref 0.0–0.1)
Basophils Absolute: 0 10*3/uL (ref 0.0–0.1)
Basophils Relative: 0 %
Basophils Relative: 0 %
Eosinophils Absolute: 0 10*3/uL (ref 0.0–0.5)
Eosinophils Absolute: 0 10*3/uL (ref 0.0–0.5)
Eosinophils Relative: 0 %
Eosinophils Relative: 0 %
HCT: 32.8 % — ABNORMAL LOW (ref 39.0–52.0)
HCT: 33.1 % — ABNORMAL LOW (ref 39.0–52.0)
Hemoglobin: 11.5 g/dL — ABNORMAL LOW (ref 13.0–17.0)
Hemoglobin: 11.1 g/dL — ABNORMAL LOW (ref 13.0–17.0)
Immature Granulocytes: 3 %
Lymphocytes Relative: 7 %
Lymphocytes Relative: 9 %
Lymphs Abs: 1.7 10*3/uL (ref 0.7–4.0)
Lymphs Abs: 3.9 10*3/uL (ref 0.7–4.0)
MCH: 29.5 pg (ref 26.0–34.0)
MCH: 28.8 pg (ref 26.0–34.0)
MCHC: 35.1 g/dL (ref 30.0–36.0)
MCHC: 33.5 g/dL (ref 30.0–36.0)
MCV: 84.1 fL (ref 80.0–100.0)
MCV: 85.8 fL (ref 80.0–100.0)
Monocytes Absolute: 1.5 10*3/uL — ABNORMAL HIGH (ref 0.1–1.0)
Monocytes Absolute: 3.9 10*3/uL — ABNORMAL HIGH (ref 0.1–1.0)
Monocytes Relative: 6 %
Monocytes Relative: 9 %
Neutro Abs: 20.5 10*3/uL — ABNORMAL HIGH (ref 1.7–7.7)
Neutro Abs: 35.5 10*3/uL — ABNORMAL HIGH (ref 1.7–7.7)
Neutrophils Relative %: 84 %
Neutrophils Relative %: 82 %
Platelets: 63 10*3/uL — ABNORMAL LOW (ref 150–400)
Platelets: 67 10*3/uL — ABNORMAL LOW (ref 150–400)
RBC: 3.9 MIL/uL — ABNORMAL LOW (ref 4.22–5.81)
RBC: 3.86 MIL/uL — ABNORMAL LOW (ref 4.22–5.81)
RDW: 15.3 % (ref 11.5–15.5)
RDW: 15.3 % (ref 11.5–15.5)
WBC: 24.4 10*3/uL — ABNORMAL HIGH (ref 4.0–10.5)
WBC: 43.3 10*3/uL — ABNORMAL HIGH (ref 4.0–10.5)
nRBC: 0.9 % — ABNORMAL HIGH (ref 0.0–0.2)
nRBC: 0.3 % — ABNORMAL HIGH (ref 0.0–0.2)

## 2022-11-22 LAB — RESP PANEL BY RT-PCR (RSV, FLU A&B, COVID)  RVPGX2
Influenza A by PCR: NEGATIVE
Influenza B by PCR: NEGATIVE
Resp Syncytial Virus by PCR: NEGATIVE
SARS Coronavirus 2 by RT PCR: NEGATIVE

## 2022-11-22 LAB — COOXEMETRY PANEL
Carboxyhemoglobin: 1.2 % (ref 0.5–1.5)
Methemoglobin: <0.7 % (ref 0.0–1.5)
O2 Saturation: 48.3 %
Total hemoglobin: 11.1 g/dL — ABNORMAL LOW (ref 12.0–16.0)

## 2022-11-22 LAB — BASIC METABOLIC PANEL
Anion gap: 20 — ABNORMAL HIGH (ref 5–15)
BUN: 102 mg/dL — ABNORMAL HIGH (ref 6–20)
CO2: 13 mmol/L — ABNORMAL LOW (ref 22–32)
Calcium: 8.7 mg/dL — ABNORMAL LOW (ref 8.9–10.3)
Chloride: 92 mmol/L — ABNORMAL LOW (ref 98–111)
Creatinine, Ser: 6.97 mg/dL — ABNORMAL HIGH (ref 0.61–1.24)
GFR, Estimated: 9 mL/min — ABNORMAL LOW (ref >60)
Glucose, Bld: 184 mg/dL — ABNORMAL HIGH (ref 70–99)
Potassium: 5.7 mmol/L — ABNORMAL HIGH (ref 3.5–5.1)
Sodium: 125 mmol/L — ABNORMAL LOW (ref 135–145)

## 2022-11-22 LAB — I-STAT ARTERIAL BLOOD GAS, ED
Acid-base deficit: 12 mmol/L — ABNORMAL HIGH (ref 0.0–2.0)
Bicarbonate: 11.6 mmol/L — ABNORMAL LOW (ref 20.0–28.0)
Calcium, Ion: 1.08 mmol/L — ABNORMAL LOW (ref 1.15–1.40)
HCT: 36 % — ABNORMAL LOW (ref 39.0–52.0)
Hemoglobin: 12.2 g/dL — ABNORMAL LOW (ref 13.0–17.0)
O2 Saturation: 94 %
Patient temperature: 36.8
Potassium: 5.4 mmol/L — ABNORMAL HIGH (ref 3.5–5.1)
Sodium: 125 mmol/L — ABNORMAL LOW (ref 135–145)
TCO2: 12 mmol/L — ABNORMAL LOW (ref 22–32)
pCO2 arterial: 21.5 mmHg — ABNORMAL LOW (ref 32–48)
pH, Arterial: 7.337 — ABNORMAL LOW (ref 7.35–7.45)
pO2, Arterial: 71 mmHg — ABNORMAL LOW (ref 83–108)

## 2022-11-22 LAB — ECHOCARDIOGRAM COMPLETE
Area-P 1/2: 3.08 cm2
Calc EF: 30.7 %
MV M vel: 4.54 m/s
MV Peak grad: 82.3 mmHg
MV VTI: 1 cm2
Radius: 0.5 cm
S' Lateral: 6.4 cm
Single Plane A2C EF: 31.6 %
Single Plane A4C EF: 28.2 %
Weight: 4719.61 oz

## 2022-11-22 LAB — COMPREHENSIVE METABOLIC PANEL
ALT: 1511 U/L — ABNORMAL HIGH (ref 0–44)
AST: 1676 U/L — ABNORMAL HIGH (ref 15–41)
Albumin: 3 g/dL — ABNORMAL LOW (ref 3.5–5.0)
Alkaline Phosphatase: 177 U/L — ABNORMAL HIGH (ref 38–126)
Anion gap: 19 — ABNORMAL HIGH (ref 5–15)
BUN: 105 mg/dL — ABNORMAL HIGH (ref 6–20)
CO2: 15 mmol/L — ABNORMAL LOW (ref 22–32)
Calcium: 8.6 mg/dL — ABNORMAL LOW (ref 8.9–10.3)
Chloride: 91 mmol/L — ABNORMAL LOW (ref 98–111)
Creatinine, Ser: 7.04 mg/dL — ABNORMAL HIGH (ref 0.61–1.24)
GFR, Estimated: 9 mL/min — ABNORMAL LOW (ref >60)
Glucose, Bld: 145 mg/dL — ABNORMAL HIGH (ref 70–99)
Potassium: 5.5 mmol/L — ABNORMAL HIGH (ref 3.5–5.1)
Sodium: 125 mmol/L — ABNORMAL LOW (ref 135–145)
Total Bilirubin: 3.1 mg/dL — ABNORMAL HIGH (ref 0.3–1.2)
Total Protein: 7.1 g/dL (ref 6.5–8.1)

## 2022-11-22 LAB — TROPONIN I (HIGH SENSITIVITY): Troponin I (High Sensitivity): 136 ng/L (ref <18)

## 2022-11-22 LAB — BRAIN NATRIURETIC PEPTIDE: B Natriuretic Peptide: >4500 pg/mL — ABNORMAL HIGH (ref 0.0–100.0)

## 2022-11-22 LAB — GLUCOSE, CAPILLARY
Glucose-Capillary: 140 mg/dL — ABNORMAL HIGH (ref 70–99)
Glucose-Capillary: 167 mg/dL — ABNORMAL HIGH (ref 70–99)

## 2022-11-22 LAB — LACTIC ACID, PLASMA
Lactic Acid, Venous: 3.3 mmol/L (ref 0.5–1.9)
Lactic Acid, Venous: 2 mmol/L (ref 0.5–1.9)

## 2022-11-22 MED ORDER — METOLAZONE 5 MG PO TABS
5.0000 mg | ORAL_TABLET | Freq: Once | ORAL | Status: AC
  Administered 2022-11-22: 5 mg via ORAL
  Filled 2022-11-22 (×2): qty 1

## 2022-11-22 MED ORDER — NITROGLYCERIN 2 % TD OINT
1.0000 [in_us] | TOPICAL_OINTMENT | Freq: Once | TRANSDERMAL | Status: AC
  Administered 2022-11-22: 1 [in_us] via TOPICAL
  Filled 2022-11-22: qty 1

## 2022-11-22 MED ORDER — SODIUM CHLORIDE 0.9 % IV SOLN: 250.0000 mL | INTRAVENOUS | Status: DC | PRN

## 2022-11-22 MED ORDER — FUROSEMIDE 10 MG/ML IJ SOLN
80.0000 mg | Freq: Once | INTRAMUSCULAR | Status: AC
  Administered 2022-11-22: 80 mg via INTRAVENOUS
  Filled 2022-11-22: qty 8

## 2022-11-22 MED ORDER — SODIUM CHLORIDE 0.9% FLUSH
3.0000 mL | Freq: Two times a day (BID) | INTRAVENOUS | Status: DC
  Administered 2022-11-22 – 2022-11-24 (×3): 3 mL via INTRAVENOUS

## 2022-11-22 MED ORDER — DOBUTAMINE IN D5W 4-5 MG/ML-% IV SOLN
5.0000 ug/kg/min | INTRAVENOUS | Status: DC
  Administered 2022-11-22 – 2022-11-24 (×3): 5 ug/kg/min via INTRAVENOUS
  Filled 2022-11-22 (×4): qty 250

## 2022-11-22 MED ORDER — FUROSEMIDE 10 MG/ML IJ SOLN
40.0000 mg | Freq: Once | INTRAMUSCULAR | Status: AC
  Administered 2022-11-22: 40 mg via INTRAVENOUS
  Filled 2022-11-22: qty 4

## 2022-11-22 MED ORDER — ACETAMINOPHEN 325 MG PO TABS
650.0000 mg | ORAL_TABLET | ORAL | Status: DC | PRN
  Administered 2022-11-23 (×2): 650 mg via ORAL
  Filled 2022-11-22 (×3): qty 2

## 2022-11-22 MED ORDER — SODIUM ZIRCONIUM CYCLOSILICATE 10 G PO PACK
10.0000 g | PACK | Freq: Two times a day (BID) | ORAL | Status: DC
  Administered 2022-11-22 – 2022-11-24 (×4): 10 g via ORAL
  Filled 2022-11-22 (×3): qty 1

## 2022-11-22 MED ORDER — NOREPINEPHRINE 4 MG/250ML-% IV SOLN
2.0000 ug/min | INTRAVENOUS | Status: DC
  Administered 2022-11-23: 3 ug/min via INTRAVENOUS
  Administered 2022-11-24: 2 ug/min via INTRAVENOUS
  Filled 2022-11-22 (×2): qty 250

## 2022-11-22 MED ORDER — LORAZEPAM 2 MG/ML IJ SOLN
1.0000 mg | Freq: Once | INTRAMUSCULAR | Status: AC
  Administered 2022-11-22: 1 mg via INTRAVENOUS
  Filled 2022-11-22: qty 1

## 2022-11-22 MED ORDER — NOREPINEPHRINE 4 MG/250ML-% IV SOLN
0.0000 ug/min | INTRAVENOUS | Status: DC
  Administered 2022-11-22: 3 ug/min via INTRAVENOUS
  Filled 2022-11-22: qty 250

## 2022-11-22 MED ORDER — SODIUM ZIRCONIUM CYCLOSILICATE 10 G PO PACK
10.0000 g | PACK | Freq: Once | ORAL | Status: DC
  Filled 2022-11-22: qty 1

## 2022-11-22 MED ORDER — NOREPINEPHRINE 4 MG/250ML-% IV SOLN: 2.0000 ug/min | INTRAVENOUS | Status: DC

## 2022-11-22 MED ORDER — GUAIFENESIN 100 MG/5ML PO LIQD
10.0000 mL | ORAL | Status: DC | PRN
  Administered 2022-11-23 – 2022-11-24 (×4): 10 mL via ORAL
  Filled 2022-11-22 (×4): qty 10

## 2022-11-22 MED ORDER — MORPHINE SULFATE (PF) 4 MG/ML IV SOLN
4.0000 mg | Freq: Once | INTRAVENOUS | Status: AC
  Administered 2022-11-22: 4 mg via INTRAVENOUS
  Filled 2022-11-22: qty 1

## 2022-11-22 MED ORDER — FUROSEMIDE 10 MG/ML IJ SOLN
20.0000 mg/h | INTRAVENOUS | Status: DC
  Administered 2022-11-22 – 2022-11-24 (×6): 20 mg/h via INTRAVENOUS
  Filled 2022-11-22 (×8): qty 20

## 2022-11-22 MED ORDER — MORPHINE SULFATE (CONCENTRATE) 10 MG/0.5ML PO SOLN: 10.0000 mg | ORAL | Status: DC | PRN

## 2022-11-22 MED ORDER — HEPARIN SODIUM (PORCINE) 5000 UNIT/ML IJ SOLN
5000.0000 [IU] | Freq: Three times a day (TID) | INTRAMUSCULAR | Status: DC
  Administered 2022-11-22: 5000 [IU] via SUBCUTANEOUS
  Filled 2022-11-22 (×2): qty 1

## 2022-11-22 MED ORDER — SODIUM CHLORIDE 0.9 % IV SOLN: 250.0000 mL | INTRAVENOUS | Status: DC

## 2022-11-22 MED ORDER — SODIUM CHLORIDE 0.9% FLUSH: 3.0000 mL | INTRAVENOUS | Status: DC | PRN

## 2022-11-22 MED ORDER — LORAZEPAM 1 MG PO TABS: 2.0000 mg | ORAL_TABLET | ORAL | Status: DC | PRN

## 2022-11-22 MED ORDER — INSULIN ASPART 100 UNIT/ML IJ SOLN
0.0000 [IU] | Freq: Three times a day (TID) | INTRAMUSCULAR | Status: DC
  Administered 2022-11-22: 1 [IU] via SUBCUTANEOUS

## 2022-11-22 MED ORDER — CHLORHEXIDINE GLUCONATE CLOTH 2 % EX PADS
6.0000 | MEDICATED_PAD | Freq: Every day | CUTANEOUS | Status: DC
  Administered 2022-11-22 – 2022-11-24 (×3): 6 via TOPICAL

## 2022-11-22 MED ORDER — IPRATROPIUM-ALBUTEROL 0.5-2.5 (3) MG/3ML IN SOLN: 3.0000 mL | RESPIRATORY_TRACT | Status: DC | PRN

## 2022-11-22 NOTE — Progress Notes (Signed)
Echocardiogram 2D Echocardiogram has been performed.  Douglas [name] 12/10/2022, 2:13 PM

## 2022-11-22 NOTE — ED Triage Notes (Signed)
Pt from [facility] with CP for over 12 hours. EMS gave 324 ASA and nitro x2. Pt very SOB, labored breathing, clammy with n/v noted. Full Code, pt revoked DNR yesterday

## 2022-11-22 NOTE — Progress Notes (Addendum)
Advanced Heart Failure Rounding Note  PCP-Cardiologist: None   Subjective:     Admitted overnight with cardiogenic shock and MSOF. He rescinded his DNR.  Girlfriend and sister at bedside.  CO-OX 48% on DBA 5. Lactic acid 3.3.  On lasix gtt at 20/hr + 5 mg metolazone. Has not made any urine in hours.   Scr up to 7, K 5.5. AST and ALT markedly elevated.  Patient drowsy. Intermittently wakes up. Provides short answers to questions.    Objective:   Weight Range: 133.8 kg Body mass index is 35.91 kg/m.   Vital Signs:   Temp:  [98 F (36.7 C)-98.3 F (36.8 C)] 98 F (36.7 C) (01/08 0550) Pulse Rate:  [85-110] 95 (01/08 0700) Resp:  [20-47] 20 (01/08 0700) BP: (96-150)/(59-103) 97/64 (01/08 0700) SpO2:  [90 %-98 %] 98 % (01/08 0700) Weight:  [133.8 kg] 133.8 kg (01/08 0450)    Weight change: Filed Weights   11/20/2022 0450  Weight: 133.8 kg    Intake/Output:   Intake/Output Summary (Last 24 hours) at 12/14/2022 0756 Last data filed at 11/26/2022 0700 Gross per 24 hour  Intake 135.59 ml  Output --  Net 135.59 ml      Physical Exam    General:  Critically ill appearing. In respiratory distress. HEENT: Normal Neck: Supple. JVP to ear. Carotids 2+ bilat; no bruits.  Cor: PMI nondisplaced. Regular rate & rhythm. No rubs, gallops or murmurs. Lungs: Bibasilar rales. Using abdominal muscles to breathe. Abdomen: Soft, nontender, + distended.  Extremities: No cyanosis, clubbing, rash, 2+ edema Neuro: Lethargic   Telemetry   Sinus 90s  Labs    CBC Recent Labs    11/24/2022 0128 11/21/2022 0157 11/21/2022 0555  WBC 24.4*  --  43.3*  NEUTROABS 20.5*  --  PENDING  HGB 11.5* 12.2* 11.1*  HCT 32.8* 36.0* 33.1*  MCV 84.1  --  85.8  PLT 63*  --  67*   Basic Metabolic Panel Recent Labs    11/29/2022 0128 12/07/2022 0157 11/30/2022 0555  NA 125* 125* 125*  K 5.7* 5.4* 5.5*  CL 92*  --  91*  CO2 13*  --  15*  GLUCOSE 184*  --  145*  BUN 102*  --  105*   CREATININE 6.97*  --  7.04*  CALCIUM 8.7*  --  8.6*   Liver Function Tests Recent Labs    12/12/2022 0555  AST 1,676*  ALT 1,511*  ALKPHOS 177*  BILITOT 3.1*  PROT 7.1  ALBUMIN 3.0*   No results for input(s): "LIPASE", "AMYLASE" in the last 72 hours. Cardiac Enzymes No results for input(s): "CKTOTAL", "CKMB", "CKMBINDEX", "TROPONINI" in the last 72 hours.  BNP: BNP (last 3 results) Recent Labs    10/25/22 1442 10/28/22 1825 12/07/2022 0128  BNP 1,428.2* 491.6* >4,500.0*    ProBNP (last 3 results) No results for input(s): "PROBNP" in the last 8760 hours.   D-Dimer No results for input(s): "DDIMER" in the last 72 hours. Hemoglobin A1C No results for input(s): "HGBA1C" in the last 72 hours. Fasting Lipid Panel No results for input(s): "CHOL", "HDL", "LDLCALC", "TRIG", "CHOLHDL", "LDLDIRECT" in the last 72 hours. Thyroid Function Tests No results for input(s): "TSH", "T4TOTAL", "T3FREE", "THYROIDAB" in the last 72 hours.  Invalid input(s): "FREET3"  Other results:   Imaging    [place] EKG SITE RITE  Result Date: 11/26/2022 If Site Rite image not attached, placement could not be confirmed due to current cardiac rhythm.  DG Chest [facility]  1 View  Result Date: 12-22-2022 CLINICAL DATA:  Shortness of breath. EXAM: PORTABLE CHEST 1 VIEW COMPARISON:  11/12/2022. FINDINGS: The heart is enlarged and the mediastinal contour is within normal limits. The pulmonary vasculature is distended. Patchy airspace disease is present in the mid to lower lung fields bilaterally. No effusion or pneumothorax. A right PICC line terminates at the cavoatrial junction. A stable left pacemaker device. No acute osseous abnormality. IMPRESSION: 1. Cardiomegaly with pulmonary vascular congestion. 2. Airspace opacities in the mid to lower lung fields bilaterally, slightly increased from the prior exam. Electronically Signed   By: Thornell Sartorius M.D.   On: 12/22/2022 01:44     Medications:     Scheduled  Medications:  Chlorhexidine Gluconate Cloth  6 each Topical Daily   heparin  5,000 Units Subcutaneous Q8H   insulin aspart  0-6 Units Subcutaneous TID WC   sodium chloride flush  3 mL Intravenous Q12H   sodium zirconium cyclosilicate  10 g Oral Once   sodium zirconium cyclosilicate  10 g Oral BID    Infusions:  sodium chloride     sodium chloride     DOBUTamine 5 mcg/kg/min (12-22-22 0700)   furosemide (LASIX) 200 mg in dextrose 5 % 100 mL (2 mg/mL) infusion 20 mg/hr (12-22-22 0700)   norepinephrine (LEVOPHED) Adult infusion      PRN Medications: sodium chloride, acetaminophen, guaiFENesin, ipratropium-albuterol, LORazepam, morphine CONCENTRATE, sodium chloride flush   Assessment/Plan    1. 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 Has Medtronic ICD>>deactivated. 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   - s/p unsuccessful MitraClip secondary to severe residual MR with torn lateral chordae tendonae - Echo 12/27: EF 20-25%, RV moderately reduced, moderate to severe residual MR - Progressively worsening renal function with hyponatremia and thrombocytopenia consistent with terminal / end-stage heart failure. Not a candidate for advanced therapies at this time due to significant social barriers and now renal failure. Recently discharged to residential hospice on 01/05.  -Now readmitted with cardiogenic shock and MSOF.  -CO-OX 48% on 5 DBA. Lactic acid 3.3. No good options for treatment. Will add NE for additional support while family makes decisions regarding next steps. -He does not want BiPAP. Explained to patient and family that he would likely not be able to come off ventilator if intubated. -He rescinded his DNR. Currently full code. His girlfriend and sister arrived to bedside. We had a long discussion regarding his poor prognosis. Options discussed including transitioning to comfort care vs continuing full scope of treatment. If he were to require CPR  and/or mechanical ventilation, these measures would only prolong suffering. Family will spend time with patient and let us know what they decide. -Urgent Palliative Care consult.   2. AKI on CKD Stage IIIb - Scr now greater than 7, BUN . 100 - Not a candidate for CRRT   3. MR  - TEE 12/14: EF 20%, Severe central MR - S/p unsuccessful mTEER 12/26 as above with residual severe MR   4. Hyponatremia - Na 125 - Poor prognostic indicator  5 Shock liver - AST and ALT > 1,000  GOC: -Recently discharged with home hospice. Readmitted with cardiogenic shock and MSOF. Rescinded DNR. Prognosis very poor. Do not expect he will survive this hospitalization. -Sister, his next of kin, is at bedside. Family discussing goals of care. Urgent palliative care consult.  Length of Stay: 0  Yarelie Hams N, PA-C  2022/12/22, 7:56 AM  Advanced Heart Failure Team Pager [phone] (  M-F; 7a - 5p)  Please contact [facility] for night-coverage after hours (5p -7a ) and weekends on [email or web address]   Patient seen with PA, agree with the above note.   End stage HF.  Patient had been in hospice residential facility but became short of breath and asked them to call EMS.  He rescinded DNR and was brought to ER and admitted. He was hypotensive and volume overloaded.   Currently, drowsy on [place] 10 L/min.  Creatinine up to 7 with BUN > 100, LFTs in 1000s, lactate 3.3, WBCs 43.  He was started on dobutamine 5 and SBP now 100s.  He is on Lasix gtt 20 mg/hr and has received metolazone, not make any urine.   Family at bedside.   General: Drowsy/sleeping Neck: JVP 16 cm, no thyromegaly or thyroid nodule.  Lungs: Clear to auscultation bilaterally with normal respiratory effort. CV: Lateral PMI.  Heart regular S1/S2, no S3/S4, no murmur.  1+ ankle edema.  Abdomen: Soft, nontender, no hepatosplenomegaly, no distention.  Skin: Intact without lesions or rashes.  Neurologic: Drowsy, will wake up with stimulation.  Extremities:  No clubbing or cyanosis.  HEENT: Normal.    End stage HF, not candidate for LVAD or transplant with renal failure and social situation.  Was sent to residential hospice after last admission. Rescinded DNR and returned to hospital due to dyspnea.  He is in cardiogenic shock with AKI and creatinine up to 7.  Shock liver present.  He is encephalopathic likely due to low output HF.  Long discussion with girlfriend and sister.  I do not think that there is a way for him to survive this event.  Sister is POA, patient is encephalopathic and unable to make decisions at this point.  I told her that I do not think he would tolerate dialysis and is not a candidate for long-term HD. I do not think that he would benefit from intubation as there is not going to be a way to get fluid off him to allow extubation (AKI and not responding to diuretics, not HD candidate). I recommended DNR and return to comfort care.  Family will think about this and let us know decision.  Asked palliative care service to see.  For now, will continue dobutamine 5 with Lasix 20 mg/hr.    CRITICAL CARE Performed by: Loralie Champagne  Total critical care time: 45 minutes  Critical care time was exclusive of separately billable procedures and treating other patients.  Critical care was necessary to treat or prevent imminent or life-threatening deterioration.  Critical care was time spent personally by me on the following activities: development of treatment plan with patient and/or surrogate as well as nursing, discussions with consultants, evaluation of patient's response to treatment, examination of patient, obtaining history from patient or surrogate, ordering and performing treatments and interventions, ordering and review of laboratory studies, ordering and review of radiographic studies, pulse oximetry and re-evaluation of patient's condition.  Loralie Champagne 12/01/2022 8:39 AM

## 2022-11-22 NOTE — Progress Notes (Signed)
Received report from overnight fellow, currently admitted for general cardiology service. I checked on the patient, he is on 10 L oxygen. WBC went up to 24.4 and Cr went up to 7AM. (Note repeat WBC 43. Lactic acid 3). Spoke with CHF team, Dr. Aundra Dubin who has agreed to see him. Treatment will be very limited. Current on dobutamine and lasix gtt. Has not made any urine. Refused BiPAP, but agreed to intubation if it means to get the fluid off. BP soft 97/64. Tachypneic.   Left message with palliative care consult for urgent consultation, need to rediscuss hospice and DNR status as any treatment is likely only a temporary solution as he has end stage heart failure and has failed MitraClip. Consulted PCCM as well.

## 2022-11-22 NOTE — H&P (Signed)
Cardiology Admission History and Physical   Patient ID: Douglas [name] MRN: [id number]; DOB: 02-Jun-1976   Admission date: 11/18/2022  PCP:  Gildardo Pounds, NP   [facility] Providers Cardiologist:  None        Chief Complaint:  SOB  Patient Profile:   Douglas [name] is a 47 y.o. 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   History of Present Illness:   [name] is a 47 y.o. 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 He was admitted recently with cardiogenic shock requiring IV lasix, milrinone. Felt to be end-stage HF. Considered for LVAD but deemed not a candidate given ongoing drug use, difficult social situation and progressive renal failure. He was referred for TEER w/ MitrClip, in hopes of improving symptoms. Unfortunately, unsuccessful mTEER with severe residual MR d/t chordal disruption, 2 XTW clips being placed at A2/P2 position. Unable to place 3rd clip. Developed worsening respiratory failure requiring intubation and escalation of inotropic/ pressor support. Eventually extubated but remained inotrope dependent w/ worsening renal fx w/ SCr > 3 and hyponatremia. End-stage HF w/ no other advanced therapy options. Not candidate for transplant. Palliative care team was consulted and decision was made to transition to comfort care. Inotropes/ pressors discontinued.   At hospice, yesterday he reversed his DNR to be full code. Today, his breathing worsened. He did not feel well at all. EMS was called to bring him to the ED from hospice care. He had received oral morphine and lorazepam at his hospice facility without any improvement. EMS gave nitroglycerin without  significant change. Here in the ED, BIPAP was tried but he refused. On direct questioning, he states that he does wish to be intubated if necessary and does wish to have CPR done if necessary. Sister who his POA was at bedside, and agrees. In the ED, his labs showed signifciant kidney dysfunction, leukocytosis 25, K 5.5, hyponatremia 125. CXR showed cardiomegaly with increased pulmonary vascular congestion compared with prior. Troponin mildly up. EKG NSR with no ST elevation. Cardiology was called for admission.    Past Medical History:  Diagnosis Date   Depression, major, single episode 01/14/2017   Diabetes mellitus [facility])    ED (erectile dysfunction)    History of syncope 01/29/2015   Hypertension    ICD (implantable cardioverter-defibrillator) discharge 11/30/2014   On 11/30/14. Asymptomatic.    Nonischemic cardiomyopathy ([place])    a.  echo 4/06: EF 30%, mild to mod MR, mild RAE, inf HK, lat HK , ant HK;    b.  cath 4/06: no CAD, EF 20-25%   NSVT (nonsustained ventricular tachycardia) ([facility])    Obesity    Systolic CHF, chronic ([place])    EF 11/17 25-30%, s/p ICD    Past Surgical History:  Procedure Laterality Date   CARDIAC CATHETERIZATION  09/2011; 02/2013; 04/2013   CARDIAC DEFIBRILLATOR PLACEMENT  08/23/2013   IMPLANTABLE CARDIOVERTER DEFIBRILLATOR IMPLANT N/A 08/23/2013   Procedure: IMPLANTABLE CARDIOVERTER DEFIBRILLATOR IMPLANT;  Surgeon: Deboraha Sprang, MD;  Location: [facility] CATH LAB;  Service: Cardiovascular;  Laterality: N/A;   LEFT AND RIGHT HEART CATHETERIZATION WITH CORONARY ANGIOGRAM N/A 09/20/2011   Procedure: LEFT AND RIGHT HEART  CATHETERIZATION WITH CORONARY ANGIOGRAM;  Surgeon: Jolaine Artist, MD;  Location: [facility] CATH LAB;  Service: Cardiovascular;  Laterality: N/A;   MITRAL VALVE REPAIR N/A 11/09/2022   Procedure: MITRAL VALVE REPAIR;  Surgeon: Sherren Mocha, MD;  Location: [place] CV LAB;  Service: Cardiovascular;  Laterality: N/A;   MULTIPLE EXTRACTIONS WITH ALVEOLOPLASTY  N/A 01/26/2013   Procedure:  EXTRACION  TOOTH # 19 WITH ALVEOLOPLASTY;  Surgeon: Lenn Cal, DDS;  Location: [place];  Service: Oral Surgery;  Laterality: N/A;   RIGHT HEART CATH N/A 11/05/2022   Procedure: RIGHT HEART CATH;  Surgeon: Jolaine Artist, MD;  Location: [place] CV LAB;  Service: Cardiovascular;  Laterality: N/A;   RIGHT HEART CATHETERIZATION N/A 02/22/2013   Procedure: RIGHT HEART CATH;  Surgeon: Jolaine Artist, MD;  Location: [facility] CATH LAB;  Service: Cardiovascular;  Laterality: N/A;   RIGHT HEART CATHETERIZATION N/A 05/03/2013   Procedure: RIGHT HEART CATH;  Surgeon: Jolaine Artist, MD;  Location: [facility] CATH LAB;  Service: Cardiovascular;  Laterality: N/A;   RIGHT/LEFT HEART CATH AND CORONARY ANGIOGRAPHY N/A 03/03/2022   Procedure: RIGHT/LEFT HEART CATH AND CORONARY ANGIOGRAPHY;  Surgeon: Jolaine Artist, MD;  Location: [place] CV LAB;  Service: Cardiovascular;  Laterality: N/A;   TEE WITHOUT CARDIOVERSION N/A 10/28/2022   Procedure: TRANSESOPHAGEAL ECHOCARDIOGRAM (TEE);  Surgeon: Jolaine Artist, MD;  Location: [facility] ENDOSCOPY;  Service: Cardiovascular;  Laterality: N/A;   TEE WITHOUT CARDIOVERSION N/A 11/09/2022   Procedure: TRANSESOPHAGEAL ECHOCARDIOGRAM (TEE);  Surgeon: Sherren Mocha, MD;  Location: [place] CV LAB;  Service: Cardiovascular;  Laterality: N/A;     Medications Prior to Admission: Prior to Admission medications   Medication Sig Start Date End Date Taking? Authorizing Provider  furosemide (LASIX) 40 MG tablet Take 40 mg by mouth.   Yes [provider]  guaiFENesin (ROBITUSSIN) 100 MG/5ML liquid Take 10 mLs by mouth every 4 (four) hours as needed for cough.   Yes [provider]  haloperidol (HALDOL) 5 MG tablet Take 5 mg by mouth every 4 (four) hours as needed for agitation (restlessness, nausea).   Yes [provider]  ipratropium-albuterol (DUONEB) 0.5-2.5 (3) MG/3ML SOLN Take 3 mLs by nebulization every 4  (four) hours as needed (for shortness of breath or wheezing).   Yes [provider]  LORazepam (ATIVAN) 1 MG tablet Take 2 mg by mouth every 4 (four) hours as needed for anxiety.   Yes [provider]  morphine (ROXANOL) 20 MG/ML concentrated solution Take 10 mg by mouth every 2 (two) hours as needed for severe pain.   Yes [provider]     Allergies:    Allergies  Allergen Reactions   Bydureon [Exenatide] Rash   Penicillins Other (See Comments)    Childhood allergy Unknown reaction    Social History:   Social History   Socioeconomic History   Marital status: Widowed    Spouse name: Not on file   Number of children: 5   Years of education: 27   Highest education level: Not on file  Occupational History   Occupation: [employer]    Comment: part-time  Tobacco Use   Smoking status: Every Day    Packs/day: 0.25    Years: 8.00    Total pack years: 2.00    Types: Cigarettes   Smokeless tobacco: Never   Tobacco comments:    ~3 cigarettes a day  Vaping Use   Vaping Use: Never used  Substance and Sexual Activity  Alcohol use: Yes    Alcohol/week: 0.0 standard drinks of alcohol    Comment: 2x week.    Drug use: Not Currently    Types: Cocaine    Comment: 08/10/17 - last use one week ago   Sexual activity: Yes  Other Topics Concern   Not on file  Social History Narrative   Referred to PCP- appointment made for 09/04/18 at 9:30am at [facility]      Provided with food pantry and free meal list- patient reported sometimes having issues paying for food but reports he does received food stamps and works part time- he does not pay for housing as he lives with his sister.      Patient uses his mother's car and reports no issues getting transport to to medical appointments.   Social Determinants of Health   Financial Resource Strain: High Risk (03/01/2022)   Overall Financial Resource Strain (CARDIA)    Difficulty of Paying Living Expenses: Hard   Food Insecurity: No Food Insecurity (11/16/2022)   Hunger Vital Sign    Worried About Running Out of Food in the Last Year: Never true    Ran Out of Food in the Last Year: Never true  Transportation Needs: No Transportation Needs (10/28/2022)   PRAPARE - Administrator, Civil Service (Medical): No    Lack of Transportation (Non-Medical): No  Physical Activity: Not on file  Stress: Not on file  Social Connections: Not on file  Intimate Partner Violence: Not At Risk (11/16/2022)   Humiliation, Afraid, Rape, and Kick questionnaire    Fear of Current or Ex-Partner: No    Emotionally Abused: No    Physically Abused: No    Sexually Abused: No    Family History:   The patient's family history includes Coronary artery disease in his maternal uncle; Coronary artery disease (age of onset: 76) in his mother; Diabetes type II in his maternal uncle; Lung cancer in his father. There is no history of Stroke or Heart attack.    ROS:  Please see the history of present illness.  All other ROS reviewed and negative.     Physical Exam/Data:   Vitals:   11/28/2022 0245 11/15/2022 0300 12/01/2022 0315 12/13/2022 0330  BP: 115/75 112/75 105/77 112/77  Pulse: 97 96 95 95  Resp: (!) 45 (!) 42 (!) 27 (!) 23  Temp:      TempSrc:      SpO2: 95% 96% 95% 97%   No intake or output data in the 24 hours ending 12/03/2022 0445    11/18/2022    5:00 AM 11/17/2022    5:00 AM 11/16/2022    5:00 AM  Last 3 Weights  Weight (lbs) 272 lb 14.9 oz 285 lb 0.9 oz 281 lb 1.4 oz  Weight (kg) 123.8 kg 129.3 kg 127.5 kg     There is no height or weight on file to calculate BMI.  General:  Severe distress. Looks ill HEENT: normal Neck: Elevated JVD Vascular: No carotid bruits; Distal pulses 2+ bilaterally   Cardiac:  Tachycardic, S1 and S2 Lungs: Poor airflow; crackles throughout Abd: Distended; mildly tender Ext: severe pitting edema Musculoskeletal:  No deformities, BUE and BLE strength normal and equal Skin: warm  and dry  Neuro:  CNs 2-12 intact, no focal abnormalities noted Psych:  Hard to assess   EKG:  The ECG that was done  was personally reviewed and demonstrates no ST elevation  Relevant CV Studies:  TEE 10/28/22  LEFT VENTRICLE: Dilated EF = 20%. Global HK RIGHT VENTRICLE: Moderately reduced LEFT ATRIUM: Severely dilated with smoke LEFT ATRIAL APPENDAGE: No thrombus.  RIGHT ATRIUM: Moderately dilated AORTIC VALVE:  Trileaflet. Mildly calcified No AI MITRAL VALVE:    Normal. Severe central functional MR with mild restriction of posterior leaflet. No significant flow reversal in pulmonary veins TRICUSPID VALVE: Normal. Moderate TR PULMONIC VALVE: Grossly normal. Trivial PI  INTERATRIAL SEPTUM: No PFO or ASD. PERICARDIUM: No effusion  DESCENDING AORTA: Mild plque     RHC 11/05/22 Findings:   On milrinone 0.25 mcg/kg/min   RA = 2 RV = 59/9 PA = 58/30 (44) PCW = 33 (v = 48) Fick cardiac output/index = 5.3/2.1 Thermo CO/CI = 4.4 1.8 PVR = 2.1 (Fick) 2.5 (TD) Ao sat =  93% PA sat = 59%, 63% PAPi = 14   Assessment: 1, Volume overload with low output despite milrinone support 2. Prominent v waves in PCWP tracing c/w known severe MR 3. Normal PAPi suggestive of preserved RV function     2D Echo 11/10/22 Left ventricular ejection fraction, by estimation, is 20 to 25%. The left ventricle has severely decreased function. The left ventricle demonstrates global hypokinesis. The left ventricular internal cavity size was severely dilated. Left ventricular diastolic parameters are indeterminate. 1. Right ventricular systolic function is moderately reduced. The right ventricular size is mildly enlarged. There is moderately elevated pulmonary artery systolic pressure. 2. 3. Left atrial size was severely dilated. 4. Right atrial size was mildly dilated. Post repair TEER with two XTW clips positioned between A2/P2 mean diastolic gradient 6 peak 21 mmhg at HR of 76 bpm MVA 3.4 cm2  moderate-severe residual MR with jets on either side of the clips dominant eccentric jet wraps posteriorly . The mitral valve has been repaired/replaced. Moderate to severe mitral valve regurgitation. No evidence of mitral stenosis. There is a Mitra-Clip present in the mitral position. Procedure Date: 11/09/22. 5. The aortic valve is tricuspid. Aortic valve regurgitation is not visualized. No aortic stenosis is present. 6. 7. No shunt seen post trans septal.  Laboratory Data:  High Sensitivity Troponin:   Recent Labs  Lab 10/25/22 1442 10/25/22 1530 2022-12-01 0128  TROPONINIHS 120* 153* 136*      Chemistry Recent Labs  Lab 11/16/22 0400 11/17/22 0500 11/18/22 0433 12-01-2022 0128 01-Dec-2022 0157  NA 131*   < > 123* 125* 125*  K 3.4*   < > 3.3* 5.7* 5.4*  CL 94*   < > 91* 92*  --   CO2 22   < > 18* 13*  --   GLUCOSE 102*   < > 255* 184*  --   BUN 56*   < > 66* 102*  --   CREATININE 2.59*   < > 3.16* 6.97*  --   CALCIUM 9.0   < > 8.3* 8.7*  --   MG 2.1  --   --   --   --   GFRNONAA 30*   < > 23* 9*  --   ANIONGAP 15   < > 14 20*  --    < > = values in this interval not displayed.    Recent Labs  Lab 11/17/22 0810  PROT 6.4*  ALBUMIN 2.6*  AST 23  ALT 33  ALKPHOS 74  BILITOT 1.3*   Lipids No results for input(s): "CHOL", "TRIG", "HDL", "LABVLDL", "LDLCALC", "CHOLHDL" in the last 168 hours. Hematology Recent Labs  Lab 11/18/22 0433 2022/12/01 0128 Dec 01, 2022 0157  WBC  10.8* 24.4*  --   RBC 3.36* 3.90*  --   HGB 9.5* 11.5* 12.2*  HCT 28.2* 32.8* 36.0*  MCV 83.9 84.1  --   MCH 28.3 29.5  --   MCHC 33.7 35.1  --   RDW 13.9 15.3  --   PLT 66* 63*  --    Thyroid No results for input(s): "TSH", "FREET4" in the last 168 hours. BNP Recent Labs  Lab 12/04/2022 0128  BNP >4,500.0*    DDimer No results for input(s): "DDIMER" in the last 168 hours.   Radiology/Studies:  DG Chest Port 1 View  Result Date: 11/24/2022 CLINICAL DATA:  Shortness of breath. EXAM:  PORTABLE CHEST 1 VIEW COMPARISON:  11/12/2022. FINDINGS: The heart is enlarged and the mediastinal contour is within normal limits. The pulmonary vasculature is distended. Patchy airspace disease is present in the mid to lower lung fields bilaterally. No effusion or pneumothorax. A right PICC line terminates at the cavoatrial junction. A stable left pacemaker device. No acute osseous abnormality. IMPRESSION: 1. Cardiomegaly with pulmonary vascular congestion. 2. Airspace opacities in the mid to lower lung fields bilaterally, slightly increased from the prior exam. Electronically Signed   By: Brett Fairy M.D.   On: 11/15/2022 01:44     Assessment and Plan:   # Cardiogenic Shock # Acute Hypoxic Respiratory Failure # Severe AKI on CKD # Acute on Chronic Systolic HF # CAD # Substance Abuse # HTN # DM # Severe MR # Hx of Atrial Fibrillation # Thrombocytopenia  -End-stage HF. Considered for LVAD but deemed not a candidate given ongoing drug use, difficult social situation and progressive renal failure. Palliative care team was consulted and decision was made to transition to comfort care in last hospitalization. He was discharged to hospice care; revoked his DNR yesterday. Wants to be full code and want everything.  -Sister POA.. Had extensive discussion at bedside -Palliative Consult -In the mean time, will try lasix drip at 20 and metolazone 5. He was given IV lasix 80 with zero UO. Suspect will need dialysis -Currently on 6 Liters of oxygen. Tachypnic. Refusing BIPAP. He will tire out very soon. Close monitoring for possible intubation. He wants intubation if absolutely necessary -Strict I/O -Dobutamine 5 for CO support. He was on milrinone on prior admission- will try dobutamine first considering severe kidney dysfunction -PICC. Measure Co-oxy. Lactate levels pending -Troponin mildly up- demand. Unlikely ACS -Lokelma for HyperK -Trend BMP -Will hold heparin gtt due to  thrombocytopenia. -Hold entresto, Farxiga due to kidney dysfunction -If Sodium remains low, will start tolvaptan in AM -Renal consult in AM -s/p unsuccessful MitraClip secondary to severe residual MR with torn lateral chordae tendonae  -Echo -Extensive family discussion is likely required.   For questions or updates, please contact [place] Please consult [email or web address] for contact info under     Signed, Jaci Lazier, MD  11/29/2022 4:45 AM

## 2022-11-22 NOTE — Progress Notes (Signed)
Palliative Care met with patient and family today.  Code status now DNR.   Will continue inotropes today. No escalation in care. No lab draws.  Family visiting with patient this afternoon.  Will likely transition to comfort measures tomorrow am.

## 2022-11-22 NOTE — Progress Notes (Addendum)
[facility]) [facility] Liaison Note   Mr. 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 Patient was admitted to [facility] on Nov 26, 2022 with Cardiogenic Shock. Patient had been a patient at [facility] since 11/19/22 and early this morning requested transfer to ED for evaluation of chest pain, nausea, with the desire to seek full treatment.  This a related admission per Dr. Tomasa Hosteller, an [facility] physician. Patient is currently a DNR.   Checked in with bedside RN who stated that patient is actively dying and has been surrounded/comforted by family since arrival to floor. Also adding that patient was unsure of wishes regarding EOL and that PMT had just visited to discuss EOL Care/goals. Visited with patient at bedside with family, Patient opens eyes to verbal stimuli struggles to keep eyes open during conversation. Patient seemed comfortable and nods to answer questions. Spoke with daughter with Chaplain at side,  after MD and Palliative team visits to discuss EOL wishes, who made the decision with family to transition to comfort care and DNR status. Supported daughter and family encouraging to verbalize feelings and concerns.    Patient is inpatient appropriate due to IV Medications needing administration by skilled medical staff and frequent skilled assessment needing to control chest pain/kidney failure.   V/S:  97.5,86, 22,109/72, 97%  sats on 10 L HFNC I&O: intake 215, no output Abnormal lab work: [place] art 7.337, PCO2 21.5, Po2 71, Wbc 43.3, RBC 3.86, Hemo 11.1, HCT 33.1, Plate 67, Na 161, K 5.5, Chl 91,CO2 15, BUN 105, Create 7.04, Cal 8.6, Gluc 145,  Diagnostics:  DG Chest Cardiomegaly with pulmonary vascular congestion IVs/PRNs: Dobutrex 16mcg/kg/min IV continuous, Lasix 20mg /hr IV continuous. No prns on day shift.   Problem list: Afternoon MD Note: Palliative Care met with patient and family today. Code  status now DNR.   Will continue inotropes today. No escalation in care. No lab draws.  Family visiting with patient this afternoon.Will likely transition to comfort measures tomorrow am.         1. 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 Has Medtronic ICD>>deactivated. Had [place] earlier this year with nonobstructive CAD.   - s/p unsuccessful MitraClip secondary to severe residual MR with torn lateral chordae tendonae - Echo 12/27: EF 20-25%, RV moderately reduced, moderate to severe residual MR - Progressively worsening renal function with hyponatremia and thrombocytopenia consistent with terminal / end-stage heart failure. Not a candidate for advanced therapies at this time due to significant social barriers and now renal failure. Recently discharged to residential hospice on 01/05.  -Now readmitted with cardiogenic shock and MSOF.  -CO-OX 48% on 5 DBA. Lactic acid 3.3. No good options for treatment. Will add NE for additional support while family makes decisions regarding next steps. -He does not want BiPAP. Explained to patient and family that he would likely not be able to come off ventilator if intubated. -He rescinded his DNR. Currently full code. His girlfriend and sister arrived to bedside. We had a long discussion regarding his poor prognosis. Options discussed including transitioning to comfort care vs continuing full scope of treatment. If he were to require CPR and/or mechanical ventilation, these measures would only prolong suffering. Family will spend time with patient and let us know what they decide. -Urgent Palliative Care consult.  2. AKI on CKD Stage IIIb- Scr now greater than 7, BUN . 100- Not a candidate for CRRT  3.  MR - TEE 12/14: EF 20%, Severe central MR- S/p unsuccessful mTEER 12/26 as above with residual severe MR  4. Hyponatremia- Na 125- Poor prognostic indicator  5 Shock liver- AST and ALT > 1,000  GOC:-Recently discharged with home hospice. Readmitted  with cardiogenic shock and MSOF. Rescinded DNR. Prognosis very poor. Do not expect he will survive this hospitalization. -Sister, his next of kin, is at bedside. Family discussing goals of care. Urgent palliative care consult.     Goals of Care: After much discussion, family has decided to transition to comfort care with support of the hospital and [facility] hospice. Communication with IDT- Updated ACC team.  Communication with PCG-Supported family - made aware that [facility] will visit daily to support patient/family. Encouraged to call as needed.  ACC Transfer Summary and Medication List placed on patient shadow chart. Please use [facility] for all ACC patient needs.  Please call with any hospice related questions/concerns,   Roda Shutters, RN [facility] Liaison (in [place]) [phone]

## 2022-11-22 NOTE — ED Provider Notes (Signed)
[place] EMERGENCY DEPARTMENT Provider Note   CSN: [id number] Arrival date & time: 11/30/2022  0056     History  Chief Complaint  Patient presents with   Chest Pain    Douglas [name] is a 47 y.o. male.  The history is provided by the patient.  Chest Pain He has history of hypertension, diabetes, systolic heart failure status post AICD and was transferred from hospice because of chest pain and dyspnea which is not responding to treatment there.  He has noted some increasing swelling of his legs over the last 2 weeks and has had chest pain throughout the day today.  He was placed on CPAP, but was unable to tolerate it.  He had been DNR, but revoked that yesterday.  On direct questioning, he states that he does wish to be intubated if necessary and does wish to have CPR done if necessary.  He had received oral morphine and lorazepam at his hospice facility without any improvement.  EMS gave nitroglycerin without significant change.    Home Medications Prior to Admission medications   Not on File      Allergies    Bydureon [exenatide] and Penicillins    Review of Systems   Review of Systems  Cardiovascular:  Positive for chest pain.  All other systems reviewed and are negative.   Physical Exam Updated Vital Signs BP 112/77   Pulse 95   Temp 98.3 F (36.8 C) (Oral)   Resp (!) 23   SpO2 97%  Physical Exam Vitals and nursing note reviewed.   47 year old male in moderate respiratory distress.  He is unable to speak in a complete sentence without stopping for another breath.  Vital signs are significant for elevated heart rate, respiratory rate, blood pressure. Oxygen saturation is 98%, which is normal. Head is normocephalic and atraumatic. PERRLA, EOMI. Oropharynx is clear. Neck is nontender and supple without adenopathy or JVD. Back is nontender and there is no CVA tenderness.  There is no presacral edema. Lungs have bibasilar rales without wheezes or  rhonchi. Chest is nontender. Heart has regular rate and rhythm without murmur. Abdomen is soft, flat, nontender without masses or hepatosplenomegaly and peristalsis is normoactive. Extremities have 3+ edema. Skin is warm and dry without rash. Neurologic: Mental status is normal, cranial nerves are intact, moves all extremities equally.  ED Results / Procedures / Treatments   Labs (all labs ordered are listed, but only abnormal results are displayed) Labs Reviewed  BRAIN NATRIURETIC PEPTIDE - Abnormal; Notable for the following components:      Result Value   B Natriuretic Peptide >4,500.0 (*)    All other components within normal limits  BASIC METABOLIC PANEL - Abnormal; Notable for the following components:   Sodium 125 (*)    Potassium 5.7 (*)    Chloride 92 (*)    CO2 13 (*)    Glucose, Bld 184 (*)    BUN 102 (*)    Creatinine, Ser 6.97 (*)    Calcium 8.7 (*)    GFR, Estimated 9 (*)    Anion gap 20 (*)    All other components within normal limits  CBC WITH DIFFERENTIAL/PLATELET - Abnormal; Notable for the following components:   WBC 24.4 (*)    RBC 3.90 (*)    Hemoglobin 11.5 (*)    HCT 32.8 (*)    Platelets 63 (*)    nRBC 0.9 (*)    Neutro Abs 20.5 (*)  Monocytes Absolute 1.5 (*)    Abs Immature Granulocytes 0.61 (*)    All other components within normal limits  I-STAT ARTERIAL BLOOD GAS, ED - Abnormal; Notable for the following components:   pH, Arterial 7.337 (*)    pCO2 arterial 21.5 (*)    pO2, Arterial 71 (*)    Bicarbonate 11.6 (*)    TCO2 12 (*)    Acid-base deficit 12.0 (*)    Sodium 125 (*)    Potassium 5.4 (*)    Calcium, Ion 1.08 (*)    HCT 36.0 (*)    Hemoglobin 12.2 (*)    All other components within normal limits  TROPONIN I (HIGH SENSITIVITY) - Abnormal; Notable for the following components:   Troponin I (High Sensitivity) 136 (*)    All other components within normal limits  RESP PANEL BY RT-PCR (RSV, FLU A&B, COVID)  RVPGX2    EKG EKG  Interpretation  Date/Time:  Monday November 22 2022 01:07:17 EST Ventricular Rate:  110 PR Interval:  191 QRS Duration: 120 QT Interval:  335 QTC Calculation: 272 R Axis:   -42 Text Interpretation: Sinus tachycardia Left atrial enlargement Nonspecific IVCD with LAD When compared with ECG of 11/10/2022, No significant change was found Confirmed by Delora Fuel ([id number]) on 12/11/2022 1:09:38 AM  Radiology DG Chest Port 1 View  Result Date: 11/29/2022 CLINICAL DATA:  Shortness of breath. EXAM: PORTABLE CHEST 1 VIEW COMPARISON:  11/12/2022. FINDINGS: The heart is enlarged and the mediastinal contour is within normal limits. The pulmonary vasculature is distended. Patchy airspace disease is present in the mid to lower lung fields bilaterally. No effusion or pneumothorax. A right PICC line terminates at the cavoatrial junction. A stable left pacemaker device. No acute osseous abnormality. IMPRESSION: 1. Cardiomegaly with pulmonary vascular congestion. 2. Airspace opacities in the mid to lower lung fields bilaterally, slightly increased from the prior exam. Electronically Signed   By: Brett Fairy M.D.   On: 11/24/2022 01:44    Procedures Procedures  Cardiac monitor shows sinus tachycardia, per my interpretation.  Medications Ordered in ED Medications  nitroGLYCERIN (NITROGLYN) 2 % ointment 1 inch (1 inch Topical Given 12/03/2022 0133)  morphine (PF) 4 MG/ML injection 4 mg (4 mg Intravenous Given 11/29/2022 0132)  LORazepam (ATIVAN) injection 1 mg (1 mg Intravenous Given 11/16/2022 0134)  furosemide (LASIX) injection 40 mg (40 mg Intravenous Given 12/10/2022 0130)  furosemide (LASIX) injection 80 mg (80 mg Intravenous Given 12/02/2022 0325)    ED Course/ Medical Decision Making/ A&P                           Medical Decision Making Amount and/or Complexity of Data Reviewed Labs: ordered. Radiology: ordered.  Risk Prescription drug management. Decision regarding hospitalization.   Worsening heart failure.   Chest pain concerning for ACS.  Patient who was DNR, now full code.  Patient is refusing to try BiPAP.  I am concerned that he is going to tire.  I have ordered intravenous morphine, lorazepam, furosemide and topical nitroglycerin.  I have reviewed and interpreted his electrocardiogram, and my interpretation is left atrial hypertrophy, nonspecific inferior ventricular conduction delay, left axis deviation unchanged from prior.  I have ordered a chest x-ray which, per my interpretation, shows stable cardiomegaly with increased pulmonary vascular congestion compared with prior.  Final radiologist interpretation is pending.  I have ordered labs including CBC, basic metabolic panel, troponin, BNP and I have ordered an arterial blood  gas to make sure he is not going into hypercarbic respiratory failure.  He will need close observation in case he deteriorates and needs intubation.  I have reviewed his past records, and I note hospital admission on 10/25/2022 through 11/19/2022 for heart failure exacerbation.  He was intubated during that hospitalization.  I have reviewed and interpreted his arterial blood gas, and my interpretation is no carbon dioxide retention, no respiratory acidosis.  Although he is still tachypneic, he is much improved following above-noted treatment.  I have reviewed the radiologist's interpretation of chest x-ray and radiologist is in agreement with me.  I have reviewed and interpreted his laboratory test, and my interpretation is moderate hyponatremia not significantly changed from recent values, mild hyperkalemia, worsening metabolic acidosis, markedly elevated creatinine with dramatic change compared with 1/4 (creatinine on 1/4 3.16, creatinine tonight 6.97).  Troponin is mildly elevated but not significantly higher than it had been in prior hospitalization.  This is actually surprising given the worsening renal function.  BNP is markedly elevated, beyond the level of detection and our lab.   Anemia is actually improved over baseline, I suspect this is partly due to intravascular contraction in the setting of peripheral edema.  Thrombocytopenia is present not significantly changed from recent value.  Respiratory pathogen panel was negative.  At this point, it is obvious that his management is going to be exceedingly difficult and he may require dialysis for adequate fluid removal.  I have explained this to patient and family members.  I have discussed the case with Dr. Welton Flakes of cardiology service who agrees to admit him.  Also, in light of markedly worsening renal function, I have given him a second dose of furosemide although I doubt it will have much effect.  Because of hyperkalemia, I have ordered a dose of sodium zirconium cyclosilicate.  CRITICAL CARE Performed by: [name] Total critical care time: 135 minutes Critical care time was exclusive of separately billable procedures and treating other patients. Critical care was necessary to treat or prevent imminent or life-threatening deterioration. Critical care was time spent personally by me on the following activities: development of treatment plan with patient and/or surrogate as well as nursing, discussions with consultants, evaluation of patient's response to treatment, examination of patient, obtaining history from patient or surrogate, ordering and performing treatments and interventions, ordering and review of laboratory studies, ordering and review of radiographic studies, pulse oximetry and re-evaluation of patient's condition.  Final Clinical Impression(s) / ED Diagnoses Final diagnoses:  Acute on chronic systolic heart failure (HCC)  Acute renal failure, unspecified acute renal failure type (HCC)  Hyponatremia  Metabolic acidosis  Normochromic normocytic anemia  Thrombocytopenia (HCC)  Hyperkalemia    Rx / DC Orders ED Discharge Orders     None         [name], MD 12/10/2022 0451

## 2022-11-22 NOTE — Consult Note (Signed)
Consultation Note Date: 12/06/2022 at 1000  Patient Name: Douglas [name]  DOB: [birth year]  MRN: [id number]  Age / Sex: 47 y.o., male  PCP: Claiborne Rigg, NP Referring Physician: Laurey Morale, MD  Reason for Consultation: Establishing goals of care  HPI/Patient Profile: 47 y.o. 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  Patient is currently being treated with dobutamine, levophed, and lasix gtt.   As per cardiology, any treatment is likely only a temporary solution due to [facility] and failed MitraClip.  PMT was consulted to discuss goals of care.  Clinical Assessment and Goals of Care: I have reviewed medical records including EPIC notes, labs and imaging, assessed the patient and then met with patient, his sister, his girlfriend, and his adult children at bedside to discuss diagnosis prognosis, GOC, EOL wishes, disposition and options.  I introduced Palliative Medicine as specialized medical care for people living with serious illness. It focuses on providing relief from the symptoms and stress of a serious illness. The goal is to improve quality of life for both the patient and the family.  A brief life discussion was not held as patient is familiar to our service from his recent hospitalization and discharged on 1/5.  Patient appears sleepy/lethargic but is participatory in discussions.  He has his eyes closed but nods and says yes/no appropriately during our discussion.  His sister is his HCPOA.  I attempted to gauge patient and sister's understanding of current medical situation.  Patient Sister shares that patient was "suffering" and that it was "awful" at the hospice home.  She states the  patient was feeling short of breath and that this was never relieved with medications given.  She conveyed that patient wanted to return to the hospital to "get some relief". Patient nodded in agreement as sister recalled events leading up to rehospitalization.   Education provided on dobutamine, Lasix, and Levophed gtt.'s.  Reviewed patient's previous wishes to allow a natural death.  CODE STATUS, plan of care, and expectations at EOL were reviewed.  I attempted to elicit values and goals important to the patient.  He shared he wants to be "happy and comfortable".  When asked that he is currently happy and comfortable, patient nodded and said yes.  Family is awaiting arrival of other members in order to discuss next steps. We planned to meet again in a few hours.   I returned to bedside at approximately 1 PM and discussed plan of care with patient's girlfriend at bedside and then patient's sister/HCPOA, other sister, and several other family numbers into 2H waiting room.  I offered to move family to a private conference room and they declined.   We discussed patient's current illness and what it means in the larger context of patient's ongoing comorbidities.  Discussed multiple organ failure including liver, kidney, and heart.  Reviewed allowing a natural death and not intervening with medications as well as CPR, defibrillation, and use of mechanical ventilatory support.  Family shares they want to honor patient's wishes and are in agreement with DNR/DNI.  Also discussed that increasing use of inotropes and other life-sustaining medications would not be in line with patient's current goals.  Patient has been refusing BiPAP and has stated he does not want to "waste any more time".  Patient has shared with family that he is "tired".  Family wants to ensure that they are doing no harm and supporting the patient while minimizing his discomfort and suffering.  Reviewed and family in agreement to not  escalate any care.  No further lab draws, procedures, or interventions will be performed.  Family wishes to have the next 24 hours to be able to visit with patient.  Family in agreement to reconvene and discuss shift to full comfort and liberating patient from all life prolonging measures likely tomorrow AM.  Family stated they do not want patient to be transferred to a hospice facility again.  After meeting with the family, I returned to patient's room and conveyed above information to patient.  Patient was in agreement.  Patient's girlfriend at bedside was appropriately tearful but also in agreement.  Plan for no escalation of care.  DNR/DNI.  Will likely shift to full comfort measures tomorrow morning. I plan to meet with patient/family tomorrow AM.   HF team (PAs Marlyce Huge and Lyda Jester), attending, and PMT medical director Dr. Hilma Favors made aware of above discussions.  Questions and concerns were addressed. The family was encouraged to call with questions or concerns.   Primary Decision Maker HCPOA  Physical Exam Vitals reviewed.  Constitutional:      General: He is not in acute distress.    Appearance: He is obese.  Cardiovascular:     Rate and Rhythm: Tachycardia present.  Musculoskeletal:     Right lower leg: Edema present.     Left lower leg: Edema present.  Skin:    General: Skin is warm and dry.  Neurological:     Mental Status: He is oriented to person, place, and time.  Psychiatric:        Mood and Affect: Mood is not anxious.        Behavior: Behavior is not agitated.     Palliative Assessment/Data:30%     Thank you for this consult. Palliative medicine will continue to follow and assist holistically.   Time Total: 210 minutes  Signed by: Jordan Hawks, DNP, FNP-BC Palliative Medicine    Please contact Palliative Medicine Team phone at [phone] for questions and concerns.  For individual provider: See Shea Evans

## 2022-11-22 NOTE — Progress Notes (Signed)
   December 12, 2022 1215  Spiritual Encounters  Type of Visit Initial  Care provided to: Pt and family  Conversation partners present during encounter Other (comment)  Referral source Nurse (RN/NT/LPN) [facility] [place], RN)  Reason for visit Urgent spiritual support  OnCall Visit Yes  Spiritual Framework  Presenting Themes Goals in life/care;Meaning/purpose/sources of inspiration;Values and beliefs;Significant life change;Coping tools;Courage hope and growth;Community and relationships  Values/beliefs Patient hold strong [place] and have Community Faith Present  Community/Connection Family;Friend(s);Significant other;Faith community  Programmer, systems Family Presence Supporting  Needs/Challenges/Barriers Alignment of Decision Making: Family vs Significant Other Goals of Care and End of Life Planning (Decision Making > Was DNR > Rescinded DNR > Readmission > Again DNR will transition to Comfort Care)  Patient Stress Factors Exhausted;Major life changes;Family relationships  Family Stress Factors Family relationships;Health changes;Major life changes;Loss  Interventions  Spiritual Care Interventions Made Established relationship of care and support;Compassionate presence;Reflective listening;Normalization of emotions;Explored ethical dilemma;Decision-making support/facilitation;Reconciliation with self/others;Bereavement/grief support;Prayer  Intervention Outcomes  Outcomes Awareness around self/spiritual resourses;Connection to values and goals of care;Autonomy/agency;Awareness of health;Awareness of support;Patient family open to resources;Connected to spiritual community  Spiritual Care Plan  Spiritual Care Issues Still Outstanding Referral made to (name) (Chaplain Veva Holes. [place] Silos)  Advance Directives (For Healthcare)  Does Patient Have a Medical Advance Directive? No  Would patient like information on creating a medical advance directive? No - Patient declined  [place]  Does Patient Have a Mental Health Advance Directive? No  Would patient like information on creating a mental health advance directive? No - Patient declined   Chaplain responded to Spiritual Care Page:  "To Provide Family Support during Transitioning to [place]." Chaplain spoke with Mr. Almyra Deforest, [place] - Cardiology regarding Patient being DNR and placed in [place]. Patient rescinded DNR upon admission into [facility] for Chest Pain and Shortness of Breath. Chaplain met with Patient and Significant Other: Jessica at patient's bedside. Patient was lethargic and unable to stay awake for conversation. At [place] request Chaplain offered Intercessory Prayer for Comfort, Reassurance, and Peace. Chaplain met with Gar Ponto, RN and family members in the 2-Heart Family Waiting Area to discuss goals of care and spiritual care services available. Chaplain referred to Lillia [place] for continued care as patient transitions back to [place].

## 2022-11-22 NOTE — Progress Notes (Signed)
Date and time results received: 12/08/2022 0655  Test: Lactic Acid Critical Value: 3.3  Name of Provider Notified: Dr. Jackey Loge

## 2022-11-23 DIAGNOSIS — N179 Acute kidney failure, unspecified: Secondary | ICD-10-CM

## 2022-11-23 DIAGNOSIS — N182 Chronic kidney disease, stage 2 (mild): Secondary | ICD-10-CM

## 2022-11-23 DIAGNOSIS — E875 Hyperkalemia: Secondary | ICD-10-CM | POA: Diagnosis not present

## 2022-11-23 DIAGNOSIS — I5023 Acute on chronic systolic (congestive) heart failure: Secondary | ICD-10-CM

## 2022-11-23 DIAGNOSIS — R570 Cardiogenic shock: Secondary | ICD-10-CM | POA: Diagnosis not present

## 2022-11-23 DIAGNOSIS — D649 Anemia, unspecified: Secondary | ICD-10-CM

## 2022-11-23 DIAGNOSIS — D696 Thrombocytopenia, unspecified: Secondary | ICD-10-CM

## 2022-11-23 DIAGNOSIS — Z515 Encounter for palliative care: Secondary | ICD-10-CM

## 2022-11-23 DIAGNOSIS — E1122 Type 2 diabetes mellitus with diabetic chronic kidney disease: Secondary | ICD-10-CM

## 2022-11-23 DIAGNOSIS — Z66 Do not resuscitate: Secondary | ICD-10-CM

## 2022-11-23 MED ORDER — LORAZEPAM 2 MG/ML IJ SOLN
2.0000 mg | Freq: Four times a day (QID) | INTRAMUSCULAR | Status: DC | PRN
  Administered 2022-11-23: 2 mg via INTRAVENOUS
  Filled 2022-11-23: qty 1

## 2022-11-23 MED ORDER — FENTANYL CITRATE PF 50 MCG/ML IJ SOSY
12.5000 ug | PREFILLED_SYRINGE | Freq: Once | INTRAMUSCULAR | Status: AC
  Administered 2022-11-23: 12.5 ug via INTRAVENOUS
  Filled 2022-11-23: qty 1

## 2022-11-23 MED ORDER — PROCHLORPERAZINE EDISYLATE 10 MG/2ML IJ SOLN
5.0000 mg | Freq: Four times a day (QID) | INTRAMUSCULAR | Status: DC | PRN
  Filled 2022-11-23: qty 1

## 2022-11-23 MED ORDER — FENTANYL CITRATE PF 50 MCG/ML IJ SOSY
12.5000 ug | PREFILLED_SYRINGE | INTRAMUSCULAR | Status: DC | PRN
  Administered 2022-11-23 – 2022-11-24 (×6): 25 ug via INTRAVENOUS
  Filled 2022-11-23 (×6): qty 1

## 2022-11-23 MED ORDER — FENTANYL CITRATE PF 50 MCG/ML IJ SOSY: 12.5000 ug | PREFILLED_SYRINGE | INTRAMUSCULAR | Status: DC | PRN

## 2022-11-23 MED ORDER — FENTANYL CITRATE (PF) 2500 MCG/50ML IJ SOLN
0.0000 ug/h | Status: DC
  Administered 2022-11-24: 25 ug/h via INTRAVENOUS
  Filled 2022-11-23: qty 50

## 2022-11-23 NOTE — Progress Notes (Addendum)
Palliative Care Progress Note, Assessment & Plan   Patient Name: Douglas [name]       Date: 11/23/2022 DOB: 01-Jan-1976  Age: 47 y.o. MRN#: [id number] Attending Physician: Larey Dresser, MD Primary Care Physician: Gildardo Pounds, NP Admit Date: 11/24/2022  Subjective: Patient is sitting up in bed.  He is able to acknowledge my presence and make his wishes known.  He complains of pain in his right upper chest with coughing.  He is able to use his [place]auer suction to clear his secretions.  His girlfriend is at bedside.  No other family/friends present during my visit.  HPI: 47 y.o. 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  Patient is currently being treated with dobutamine, levophed, and lasix gtt.    As per cardiology, any treatment is likely only a temporary solution due to [facility] and failed MitraClip.   PMT was consulted to discuss goals of care.  Summary of counseling/coordination of care: After reviewing the patient's chart and assessing the patient at bedside, I spoke with patient and his girlfriend Douglas [name] at bedside in regards to plan of care.    Pain/symptoms assessed.  Patient complains of 4 out of 10 pain in his right upper chest with coughing.  I advised patient utilize fentanyl 12.5 mcg to better control pain in his chest.  He declined use right now saying that he would like to wait until his family arrives.  I discussed that dobutamine and Levophed gtts are artificially maintaining life at this point.  As we discussed with patient and family yesterday, plan is to shift to full comfort  measures and liberate patient from artificial means of sustaining his life today. We discussed utilizing medications to aggressively manage patient's symptoms and experience of EOL with HF.   Patient remains in agreement with this plan but wants to wait to shift anything until his family arrives.  During my visit, patient's girlfriend Douglas [name] phoned patient's sister who shared she will be arriving at the hospital at 11 AM.  We are awaiting family's arrival in order to shift to full comfort.  RN aware to contact PMT if/when family arrives at bedside. Attending made aware of above discussion.  Addendum:  I spoke with patient's sister and other family members at bedside.  Family does not want to remove current GTTs.  They also acknolwdge that patient is starting to transition to EOL and will likely pass inspite of the dobutamine/Lasix/Levophed at current rates. They are in agreement to relieve patient of suffering as he is having increased shortness of breath and difficulty breathing.    Patient was awake and alert enough to understand that plan moving forward is to treat his symptoms with medications.    Recommendations: -Fentanyl 12.5-25mcg Q52min for SOB, increased WOB, air hunger - convert to drip if more than 2 doses require in an hour -Ativan 2mg  IV Q4H PRN for anxiety -Avoid morphine gtt d/t elevated Cr/AKI  PMT will continue to follow and support patient/family throughout his hospitalization.  Physical Exam Vitals reviewed.  Constitutional:      Comments: drowsy  Cardiovascular:     Rate and Rhythm: Tachycardia present.  Pulmonary:     Effort: Pulmonary effort is normal.  Abdominal:     General: Bowel sounds are normal.     Palpations: Abdomen is soft.  Musculoskeletal:     Right lower leg: Edema present.     Left lower leg: Edema present.     Comments: Generalized weakness  Skin:    General: Skin is warm and dry.  Neurological:     Mental Status: He is oriented to  person, place, and time.  Psychiatric:        Mood and Affect: Mood is not anxious.        Behavior: Behavior is not agitated.             Palliative Assessment/Data: 20%    Total Time 100 minutes   Thank you for allowing the Palliative Medicine Team to assist in the care of this patient.  Samara Deist L. Manon Hilding, FNP-BC Palliative Medicine Team Team Phone # [phone]

## 2022-11-23 NOTE — TOC Initial Note (Signed)
Transition of Care [facility]) - Initial/Assessment Note    Patient Details  Name: Douglas [name] MRN: [id number] Date of Birth: 1976-03-11  Transition of Care [facility]) CM/SW Contact:    Erenest Rasher, RN Phone Number: 11/23/2022, 3:54 PM  Clinical Narrative:   Readmission Patient transferred from [facility].             HF TOC CM spoke to pt's sister and Aunt. Will continue to follow for any needs.   Expected Discharge Plan: [place] Barriers to Discharge: Continued Medical Work up   Patient Goals and CMS Choice Patient states their goals for this hospitalization and ongoing recovery are:: wants patient to be comfortable CMS Medicare.gov Compare Post Acute Care list provided to:: Patient Represenative (must comment) (Sister)        Expected Discharge Plan and Services       Prior Living Arrangements/Services   Lives with:: Facility Resident     Activities of Daily Living      Permission Sought/Granted Permission sought to share information with : Case Manager Permission granted to share information with : Yes, Verbal Permission Granted  Share Information with NAME: Beatriz Chancellor     Permission granted to share info w Relationship: sister  Permission granted to share info w Contact Information: [phone]  Emotional Assessment              Admission diagnosis:  Cardiogenic shock ([place]) [R57.0] Acute on chronic systolic heart failure ([place]) [I50.23] Hyperkalemia [E87.5] Hyponatremia [O75.6] Metabolic acidosis [E33.29] Thrombocytopenia (HCC) [D69.6] Normochromic normocytic anemia [D64.9] Acute renal failure, unspecified acute renal failure type ([place]) [N17.9] Patient Active Problem List   Diagnosis Date Noted   Cardiogenic shock ([place]) 11/21/2022   Hyponatremia 11/16/2022   Hyperkalemia 11/16/2022   Normochromic normocytic anemia 12/11/2022   Thrombocytopenia ([place]) 11/15/2022   DNR (do not resuscitate) 12/06/2022   Palliative care by  specialist 12/10/2022   Pulmonary edema cardiac cause ([place]) 11/10/2022   Nonrheumatic mitral (valve) insufficiency 10/31/2022   NSVT (nonsustained ventricular tachycardia) ([place]) 10/31/2022   Anxiety 10/31/2022   Acute on chronic systolic CHF (congestive heart failure) ([place]) 10/25/2022   Acute renal failure superimposed on stage IIIb chronic kidney disease ([place]) 10/18/2022   Non-occlusive coronary artery disease 10/18/2022   Nonspecific syndrome suggestive of viral illness 07/26/2022   Smoker 07/26/2022   Acute embolism and thrombosis of deep vein of lower extremity, unspecified laterality ([place]) 03/31/2022   Onychomycosis 01/16/2017   Depression, major, single episode 01/14/2017   Allergic rhinitis 04/28/2015   Chest pain 04/21/2015   Prolonged Q-T interval on ECG 01/31/2015   ICD (MDT) in place 01/29/2015   Elevated troponin 01/29/2015   Obesity 01/29/2015   Healthcare maintenance 12/11/2014   Acute respiratory failure ([place]) 10/13/2013   Cocaine use 06/23/2012   Tobacco use 09/20/2011   Acute on chronic systolic heart failure ([place]) 09/19/2011   ED (erectile dysfunction) 08/02/2011   Chronic combined systolic and diastolic congestive heart failure ([place]) 06/21/2011   Diabetes mellitus with stage 2 chronic kidney disease ([place]) 04/28/2009   Hyperlipidemia 04/28/2009   Essential hypertension 04/28/2009   PCP:  Gildardo Pounds, NP Pharmacy:   [facility] [address] [place] [address] Phone: [phone] Fax: [place] [address] [place] [address] Phone: [phone] Fax: [phone]  [facility] - [place], [place]  [address] [place] [address] Phone: [phone] Fax: [phone]     Social Determinants of Health (SDOH) Social History: SDOH Screenings   Food  Insecurity: No Food Insecurity (11/16/2022)  Housing: Medium Risk (10/28/2022)  Transportation Needs: No Transportation Needs (10/28/2022)  Utilities: Not At Risk (10/28/2022)  Depression (PHQ2-9): Medium Risk (11/04/2022)  Financial Resource Strain: High Risk (03/01/2022)  Tobacco Use: High Risk (11/29/2022)   SDOH Interventions:     Readmission Risk Interventions     No data to display

## 2022-11-23 NOTE — Progress Notes (Signed)
prochlorperazine 5 mg q6hrs PRN for nausea/vomiting ordered for nausea, but pt. Is unsure if he wants the medication. He will let me know.

## 2022-11-23 NOTE — Plan of Care (Signed)
   Notified by RN that patient is complaining of nausea. Patient has history of prolonged QT, so is unable to receive zofran. Ordered IV prochlorperazine 5 mg q6hrs PRN for nausea/vomiting.   Margie Billet, PA-C 11/23/2022 7:38 PM

## 2022-11-23 NOTE — Progress Notes (Addendum)
Advanced Heart Failure Rounding Note  PCP-Cardiologist: None   Subjective:    Admitted 1/8 with cardiogenic shock and MSOF. He rescinded his DNR. Restarted on DBA and lasix gtt.   After discussion w/ PC, changed back to DNR. No escalation of care. Planning transition to comfort care today when family ready.   Brief runs of VT overnight but no other events. Remains on DBA 5, NE 2 + Lasix gtt at 20/hr. Lab draws d/ced. Drowsy but will awaken. No respiratory distress. Comfortable.    Objective:   Weight Range: 135.2 kg Body mass index is 36.28 kg/m.   Vital Signs:   Temp:  [97.5 F (36.4 C)] 97.5 F (36.4 C) (01/08 1117) Pulse Rate:  [84-96] 88 (01/09 0700) Resp:  [15-28] 17 (01/09 0700) BP: (97-116)/(68-87) 106/68 (01/09 0700) SpO2:  [94 %-100 %] 96 % (01/09 0700) Weight:  [135.2 kg] 135.2 kg (01/09 0500)    Weight change: Filed Weights   12/09/2022 0450 11/23/22 0500  Weight: 133.8 kg 135.2 kg    Intake/Output:   Intake/Output Summary (Last 24 hours) at 11/23/2022 0753 Last data filed at 11/23/2022 0700 Gross per 24 hour  Intake 1068.92 ml  Output 450 ml  Net 618.92 ml      Physical Exam    General:  Critically ill/fatigued appearing. NAD HEENT: Normal Neck: Supple. JVP to ear. Carotids 2+ bilat; no bruits.  Cor: PMI nondisplaced. Regular rate & rhythm. No rubs, gallops or murmurs. Lungs: Bibasilar rales. Using abdominal muscles to breathe. Abdomen: Soft, nontender, + distended.  Extremities: No cyanosis, clubbing, rash, 2+ edema Neuro: Lethargic but will awaken and respond to questions   Telemetry   Sinus 90s, brief runs of NSVT overnight   Labs    CBC Recent Labs    11/21/2022 0128 12/04/2022 0157 11/23/2022 0555  WBC 24.4*  --  43.3*  NEUTROABS 20.5*  --  35.5*  HGB 11.5* 12.2* 11.1*  HCT 32.8* 36.0* 33.1*  MCV 84.1  --  85.8  PLT 63*  --  67*   Basic Metabolic Panel Recent Labs    82/99/37 0128 12/05/2022 0157 11/21/2022 0555  NA 125* 125*  125*  K 5.7* 5.4* 5.5*  CL 92*  --  91*  CO2 13*  --  15*  GLUCOSE 184*  --  145*  BUN 102*  --  105*  CREATININE 6.97*  --  7.04*  CALCIUM 8.7*  --  8.6*   Liver Function Tests Recent Labs    12/03/2022 0555  AST 1,676*  ALT 1,511*  ALKPHOS 177*  BILITOT 3.1*  PROT 7.1  ALBUMIN 3.0*   No results for input(s): "LIPASE", "AMYLASE" in the last 72 hours. Cardiac Enzymes No results for input(s): "CKTOTAL", "CKMB", "CKMBINDEX", "TROPONINI" in the last 72 hours.  BNP: BNP (last 3 results) Recent Labs    10/25/22 1442 10/28/22 1825 12/05/2022 0128  BNP 1,428.2* 491.6* >4,500.0*    ProBNP (last 3 results) No results for input(s): "PROBNP" in the last 8760 hours.   D-Dimer No results for input(s): "DDIMER" in the last 72 hours. Hemoglobin A1C No results for input(s): "HGBA1C" in the last 72 hours. Fasting Lipid Panel No results for input(s): "CHOL", "HDL", "LDLCALC", "TRIG", "CHOLHDL", "LDLDIRECT" in the last 72 hours. Thyroid Function Tests No results for input(s): "TSH", "T4TOTAL", "T3FREE", "THYROIDAB" in the last 72 hours.  Invalid input(s): "FREET3"  Other results:   Imaging    ECHOCARDIOGRAM COMPLETE  Result Date: 12/07/2022    ECHOCARDIOGRAM  REPORT   Patient Name:   Douglas [name] Date of Exam: 12/12/2022 Medical Rec #:  [id number]         Height:       76.0 in Accession #:    [id number]        Weight:       295.0 lb Date of Birth:  [birth year]        BSA:          2.613 m Patient Age:    47 years          BP:           97/64 mmHg Patient Gender: M                 HR:           86 bpm. Exam Location:  Inpatient Procedure: 2D Echo, Cardiac Doppler and Color Doppler Indications:    R94.31 Abnormal EKG  History:        Patient has prior history of Echocardiogram examinations, most                 recent 11/10/2022. CHF and Cardiomyopathy, Defibrillator; Risk                 Factors:Hypertension and Diabetes.                  Mitral Valve: Mitra-Clip valve is present in  the mitral                 position. Procedure Date: 11/09/22.  Sonographer:    Eulah Pont RDCS Referring Phys: [id number] [facility] S KHAN IMPRESSIONS  1. There is thrombus surrounding the 2 XTW MitraClips . The mitral valve has been repaired/replaced. Severe mitral valve regurgitation. No evidence of mitral stenosis. The mean mitral valve gradient is 5.5 mmHg. There is a Mitra-Clip present in the mitral position. Procedure Date: 11/09/22.  2. Left ventricular ejection fraction, by estimation, is 25 to 30%. The left ventricle has severely decreased function. The left ventricle demonstrates global hypokinesis. The left ventricular internal cavity size was severely dilated. Left ventricular diastolic parameters are indeterminate.  3. Right ventricular systolic function is normal. The right ventricular size is moderately enlarged. There is moderately elevated pulmonary artery systolic pressure. The estimated right ventricular systolic pressure is 50.5 mmHg.  4. Left atrial size was severely dilated.  5. Right atrial size was moderately dilated.  6. The aortic valve is normal in structure. Aortic valve regurgitation is not visualized. No aortic stenosis is present.  7. The inferior vena cava is dilated in size with <50% respiratory variability, suggesting right atrial pressure of 15 mmHg. Comparison(s): Prior images reviewed side by side. FINDINGS  Left Ventricle: Left ventricular ejection fraction, by estimation, is 25 to 30%. The left ventricle has severely decreased function. The left ventricle demonstrates global hypokinesis. The left ventricular internal cavity size was severely dilated. There is no left ventricular hypertrophy. Left ventricular diastolic parameters are indeterminate. Right Ventricle: The right ventricular size is moderately enlarged. No increase in right ventricular wall thickness. Right ventricular systolic function is normal. There is moderately elevated pulmonary artery systolic pressure. The  tricuspid regurgitant  velocity is 2.98 m/s, and with an assumed right atrial pressure of 15 mmHg, the estimated right ventricular systolic pressure is 50.5 mmHg. Left Atrium: Left atrial size was severely dilated. Right Atrium: Right atrial size was moderately dilated. Pericardium: There is no evidence of pericardial effusion. Mitral Valve: There is thrombus  surrounding the 2 XTW MitraClips. The mitral valve has been repaired/replaced. Severe mitral valve regurgitation. There is a Mitra-Clip present in the mitral position. Procedure Date: 11/09/22. No evidence of mitral valve stenosis. MV peak gradient, 15.8 mmHg. The mean mitral valve gradient is 5.5 mmHg. Tricuspid Valve: The tricuspid valve is normal in structure. Tricuspid valve regurgitation is not demonstrated. No evidence of tricuspid stenosis. Aortic Valve: The aortic valve is normal in structure. Aortic valve regurgitation is not visualized. No aortic stenosis is present. Pulmonic Valve: The pulmonic valve was normal in structure. Pulmonic valve regurgitation is mild. No evidence of pulmonic stenosis. Aorta: The aortic root is normal in size and structure. Venous: The inferior vena cava is dilated in size with less than 50% respiratory variability, suggesting right atrial pressure of 15 mmHg. IAS/Shunts: No atrial level shunt detected by color flow Doppler. Additional Comments: A device lead is visualized in the right ventricle.  LEFT VENTRICLE PLAX 2D LVIDd:         7.60 cm LVIDs:         6.40 cm LV PW:         1.00 cm LV IVS:        1.00 cm LVOT diam:     2.30 cm LV SV:         42 LV SV Index:   16 LVOT Area:     4.15 cm  LV Volumes (MOD) LV vol d, MOD A2C: 361.0 ml LV vol d, MOD A4C: 287.0 ml LV vol s, MOD A2C: 247.0 ml LV vol s, MOD A4C: 206.0 ml LV SV MOD A2C:     114.0 ml LV SV MOD A4C:     287.0 ml LV SV MOD BP:      101.2 ml RIGHT VENTRICLE RV S prime:     10.40 cm/s TAPSE (M-mode): 1.7 cm LEFT ATRIUM              Index        RIGHT ATRIUM            Index LA diam:        6.00 cm  2.30 cm/m   RA Area:     32.10 cm LA Vol (A2C):   165.0 ml 63.15 ml/m  RA Volume:   125.00 ml 47.84 ml/m LA Vol (A4C):   130.0 ml 49.75 ml/m LA Biplane Vol: 154.0 ml 58.94 ml/m  AORTIC VALVE LVOT Vmax:   66.90 cm/s LVOT Vmean:  43.000 cm/s LVOT VTI:    0.102 m  AORTA Ao Root diam: 3.30 cm Ao Asc diam:  3.00 cm MITRAL VALVE                  TRICUSPID VALVE MV Area (PHT): 3.08 cm       TR Peak grad:   35.5 mmHg MV Area VTI:   1.00 cm       TR Vmax:        298.00 cm/s MV Peak grad:  15.8 mmHg MV Mean grad:  5.5 mmHg       SHUNTS MV Vmax:       1.99 m/s       Systemic VTI:  0.10 m MV Vmean:      102.8 cm/s     Systemic Diam: 2.30 cm MV Decel Time: 246 msec MR Peak grad:    82.3 mmHg MR Mean grad:    48.5 mmHg MR Vmax:         453.50  cm/s MR Vmean:        324.0 cm/s MR PISA:         1.57 cm MR PISA Eff ROA: 14 mm MR PISA Radius:  0.50 cm MV E velocity: 169.00 cm/s MV A velocity: 85.20 cm/s MV E/A ratio:  1.98 Donato Schultz MD Electronically signed by Donato Schultz MD Signature Date/Time: November 29, 2022/3:28:16 PM    Final      Medications:     Scheduled Medications:  Chlorhexidine Gluconate Cloth  6 each Topical Daily   sodium chloride flush  3 mL Intravenous Q12H   sodium zirconium cyclosilicate  10 g Oral Once   sodium zirconium cyclosilicate  10 g Oral BID    Infusions:  sodium chloride     sodium chloride Stopped (Nov 29, 2022 0824)   DOBUTamine 5 mcg/kg/min (11/23/22 0700)   furosemide (LASIX) 200 mg in dextrose 5 % 100 mL (2 mg/mL) infusion 20 mg/hr (11/23/22 0700)   norepinephrine (LEVOPHED) Adult infusion 2 mcg/min (11/23/22 0700)    PRN Medications: sodium chloride, acetaminophen, guaiFENesin, ipratropium-albuterol, LORazepam, morphine CONCENTRATE, sodium chloride flush   Assessment/Plan    1. 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 Has Medtronic ICD>>deactivated. 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   - s/p unsuccessful  MitraClip secondary to severe residual MR with torn lateral chordae tendonae - Echo 12/27: EF 20-25%, RV moderately reduced, moderate to severe residual MR - Progressively worsening renal function with hyponatremia and thrombocytopenia consistent with terminal / end-stage heart failure. Not a candidate for advanced therapies at this time due to significant social barriers and now renal failure. Recently discharged to residential hospice on 01/05 but rescinded his DNR. -Now readmitted with cardiogenic shock and MSOF. Back on DBA + NE and lasix gtt -Palliative care re consulted. After further discussion, decision made to change back to DNR. No escalation of care. Plan to transition to comfort care today once family ready.     2. AKI on CKD Stage IIIb - Scr now greater than 7 on admit, BUN . 100 - Not a candidate for CRRT   3. MR  - TEE 12/14: EF 20%, Severe central MR - S/p unsuccessful mTEER 12/26 as above with residual severe MR   4. Hyponatremia - Na 125 - Poor prognostic indicator  5 Shock liver - AST and ALT > 1,000  GOC: - DNR - plan transition to comfort care today when pt and family ready   Length of Stay: 1  Robbie Lis, PA-C  11/23/2022, 7:53 AM  Advanced Heart Failure Team Pager [phone] (M-F; 7a - 5p)  Please contact [facility] for night-coverage after hours (5p -7a ) and weekends on [email or web address]   Patient seen with PA, agree with the above note.   End stage HF.  Patient had been in hospice residential facility but became short of breath and asked them to call EMS.  He rescinded DNR and was brought to ER and admitted. He was hypotensive and volume overloaded with AKI.  Yesterday, extensive discussion with family by myself and palliative care service.  Plan was no escalation of care, no labs, transition to comfort care today.   Patient remains on dobutamine 5, NE 3, Lasix gtt 20.  He has made about 400 cc UOP.   General: Drowsy but awakens.  Neck: JVP 16 cm,  no thyromegaly or thyroid nodule.  Lungs: Clear to auscultation bilaterally with normal respiratory effort. CV: Lateral PMI.  Heart regular S1/S2, no S3/S4, 2/6 HSM LLSB/apex.  1+ edema to knees.  Abdomen: Soft, nontender, no hepatosplenomegaly, no distention.  Skin: Intact without lesions or rashes.  Neurologic: Drowsy but awakens.  Psych: Normal affect. Extremities: No clubbing or cyanosis.  HEENT: Normal.   End stage HF, not candidate for LVAD or transplant with renal failure and social situation.  Was sent to residential hospice after last admission. Rescinded DNR and returned to hospital due to dyspnea.  He is in cardiogenic shock with AKI and creatinine up to 7 yesterday.  Shock liver present.  He is encephalopathic likely due to low output HF.  Long discussion with girlfriend and sister.  I do not think that there is a way for him to survive this event.  Sister is POA, patient is encephalopathic and unable to make decisions at this point.  I told her that I do not think he would tolerate dialysis and is not a candidate for long-term HD. I do not think that he would benefit from intubation as there is not going to be a way to get fluid off him to allow extubation (AKI and not responding to diuretics, not HD candidate). I recommended DNR and return to comfort care.  Palliative care met with family as well.  Decision yesterday was for no escalation of care, transition to comfort care today.   Girlfriend is at bedside, patient is comfortable.  Would proceed to comfort care when family arrives.  Palliative care following.    Marca Ancona 11/23/2022 8:12 AM

## 2022-11-23 NOTE — Progress Notes (Signed)
Fentanyl administered to pt. for pain relief. I rechecked the effectiveness of the PRN medication and patient stated it relieved the pain. Approximately 30 minutes later patient called out stating he was in a great amount of pain again and agreed to receive another dose of fentanyl. Per the notes, if patient needs PRN fentanyl more that 2 times in one hour consider a fentanyl gtt. Cards fellow contacted for fentanyl gtt orders. Once I returned to the patients room to let him know the medication had been approved he stated he was still in a great amount of pain, but stated "he does not want fentanyl anymore if its going to make me feel like this." Fentanyl gtt not started.

## 2022-11-23 NOTE — Progress Notes (Signed)
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 Patient was admitted to [facility] on 12/05/22 with Cardiogenic Shock. Patient had been a patient at [facility] since 11/19/22 and early this morning requested transfer to ED for evaluation of chest pain, nausea, with the desire to seek full treatment.  This a related admission per Dr. Tomasa Hosteller, an [facility] physician.   Douglas [name] is alert but drowsy. Has had some complaints of chest pain. Report exchanged with RN. Still on Levophed, Dobutrex, and Lasix drip. Labs have been discontinued. The plan is to shift to full comfort measures once family has arrived at the hospital today.   Douglas [name] is inpatient appropriate due to requiring IV Medication administration and frequent skilled assessment needed to control chest pain/kidney failure.  V/S: 106/68 bp, 88 bpm, 17 RR, 96% on 10L via [place]  I/O: 1068.9/450  Labs:  None today.   Diagnostics:  None today.   IV/PRN:  DOBUTamine (DOBUTREX) infusion 4000 mcg/mL Rate: 9.29 mL/hr Dose: 5 mcg/kg/min Weight Dosing Info: 123.8 kg Freq: Continuous Route: IV Last Dose: 5 mcg/kg/min   furosemide (LASIX) 200 mg in dextrose 5 % 100 mL (2 mg/mL) infusion Rate: 10 mL/hr Dose: 20 mg/hr Freq: Continuous Route: IV Last Dose: 20 mg/hr  norepinephrine (LEVOPHED) 4mg  in 25mL (0.016 mg/mL) premix infusion Rate: 11.25 mL/hr Dose: 3 mcg/min Freq: Continuous Route: IV Last Dose: 2 mcg/min   guaiFENesin (ROBITUSSIN) 100 MG/5ML liquid 10 mL Dose: 10 mL Freq: Every 4 hours PRN Route: PO PRN Reason: cough x2  acetaminophen (TYLENOL) tablet 650 mg Dose: 650 mg Freq: Every 4 hours PRN Route: PO PRN Reasons: headache,mild pain x2  Problem list: Assessment/Plan     1. 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 Has Medtronic ICD>>deactivated. Had [place]  earlier this year with nonobstructive CAD.   - s/p unsuccessful MitraClip secondary to severe residual MR with torn lateral chordae tendonae - Echo 12/27: EF 20-25%, RV moderately reduced, moderate to severe residual MR - Progressively worsening renal function with hyponatremia and thrombocytopenia consistent with terminal / end-stage heart failure. Not a candidate for advanced therapies at this time due to significant social barriers and now renal failure. Recently discharged to residential hospice on 01/05 but rescinded his DNR. -Now readmitted with cardiogenic shock and MSOF. Back on DBA + NE and lasix gtt -Palliative care re consulted. After further discussion, decision made to change back to DNR. No escalation of care. Plan to transition to comfort care today once family ready.      2. AKI on CKD Stage IIIb - Scr now greater than 7 on admit, BUN . 100 - Not a candidate for CRRT   3. MR  - TEE 12/14: EF 20%, Severe central MR - S/p unsuccessful mTEER 12/26 as above with residual severe MR   4. Hyponatremia - Na 125 - Poor prognostic indicator   5 Shock liver - AST and ALT > 1,000  GOC: Patient is a DNR. Patient is transitioning to comfort care today once family have arrived.  D/C planning: Ongoing.    Family: Spoke to Girlfriend, [name] via telephone.    IDT: Updated  Should patient need ambulance transfer - Please use [facility]) as they contract this service for our active hospice patients.   Zigmund Gottron, RN [facility] Liaison  [phone]

## 2022-11-24 DIAGNOSIS — N179 Acute kidney failure, unspecified: Secondary | ICD-10-CM | POA: Diagnosis not present

## 2022-11-24 DIAGNOSIS — R570 Cardiogenic shock: Secondary | ICD-10-CM | POA: Diagnosis not present

## 2022-11-24 DIAGNOSIS — Z66 Do not resuscitate: Secondary | ICD-10-CM | POA: Diagnosis not present

## 2022-11-24 DIAGNOSIS — I5023 Acute on chronic systolic (congestive) heart failure: Secondary | ICD-10-CM | POA: Diagnosis not present

## 2022-11-24 DIAGNOSIS — E875 Hyperkalemia: Secondary | ICD-10-CM | POA: Diagnosis not present

## 2022-11-24 LAB — PATHOLOGIST SMEAR REVIEW

## 2022-11-24 MED ORDER — GLYCOPYRROLATE 1 MG PO TABS: 1.0000 mg | ORAL_TABLET | ORAL | Status: DC | PRN

## 2022-11-24 MED ORDER — FENTANYL BOLUS VIA INFUSION
20.0000 ug | INTRAVENOUS | Status: DC | PRN
  Administered 2022-11-24: 20 ug via INTRAVENOUS

## 2022-11-24 MED ORDER — BIOTENE DRY MOUTH MT LIQD: 15.0000 mL | OROMUCOSAL | Status: DC | PRN

## 2022-11-24 MED ORDER — LORAZEPAM 2 MG/ML IJ SOLN: 2.0000 mg | INTRAMUSCULAR | Status: DC | PRN

## 2022-11-24 MED ORDER — GLYCOPYRROLATE 0.2 MG/ML IJ SOLN: 0.2000 mg | INTRAMUSCULAR | Status: DC | PRN

## 2022-11-24 MED ORDER — POLYVINYL ALCOHOL 1.4 % OP SOLN: 1.0000 [drp] | Freq: Four times a day (QID) | OPHTHALMIC | Status: DC | PRN

## 2022-11-24 NOTE — Progress Notes (Signed)
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 Patient was admitted to [facility] on 2022/12/08 with Cardiogenic Shock. Patient had been a patient at [facility] since 11/19/22 and on the morning of December 08, 2022 requested transfer to ED for evaluation of chest pain, nausea, with the desire to seek full treatment.  This a related admission per Dr. Tomasa Hosteller, an [facility] physician.   Douglas [name] is more lethargic today. Has had complaints of chest pain. Still on Levophed, Dobutrex, and Lasix drip. Labs have been discontinued. PMT met with family again yesterday evening. Family was not ready to stop drips and move to full comfort, and patient is hesitant to receive comfort medications. Patient is having increased shortness of breath and work to breath, using abdominal muscles on 10L of 02. Pulse and blood pressure have decreased. Patient slept during my visit and did not acknowledge that I was there. Spoke with family at bedside, provided support. Report exchanged with RN. They will be discontinuing the drip this afternoon and moving to full comfort.   Douglas [name] is inpatient appropriate due to requiring IV Medication administration and frequent skilled assessment needed to manage chest pain/kidney failure.  V/S: 98.1 temp, 79/57 bp, 55 bpm, 30 RR, 94% on 10L via [place]  I/O: 981.3/950  Labs:  None today.   Diagnostics:  None today.   IV/PRN:  DOBUTamine (DOBUTREX) infusion 4000 mcg/mL Rate: 9.29 mL/hr Dose: 5 mcg/kg/min Weight Dosing Info: 123.8 kg Freq: Continuous Route: IV Last Dose: 5 mcg/kg/min   furosemide (LASIX) 200 mg in dextrose 5 % 100 mL (2 mg/mL) infusion Rate: 10 mL/hr Dose: 20 mg/hr Freq: Continuous Route: IV Last Dose: 20 mg/hr  norepinephrine (LEVOPHED) 4mg  in 283mL (0.016 mg/mL) premix infusion Rate: 11.25 mL/hr Dose: 3 mcg/min Freq: Continuous  Route: IV Last Dose: 2 mcg/min   guaiFENesin (ROBITUSSIN) 100 MG/5ML liquid 10 mL Dose: 10 mL Freq: Every 4 hours PRN Route: PO PRN Reason: cough x1  fentaNYL (SUBLIMAZE) injection 12.5-25 mcg Dose: 12.5-25 mcg Freq: Every 30 min PRN Route: IV PRN Reasons: moderate pain,severe pain x4 LORazepam (ATIVAN) injection 2 mg Dose: 2 mg Freq: Every 6 hours PRN Route: IV PRN Reason: anxiety PRN Comment: increased WOB x1   Problem list: Assessment/Plan     1. 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 Has Medtronic ICD>>deactivated. Had [place] earlier this year with nonobstructive CAD.   - s/p unsuccessful MitraClip secondary to severe residual MR with torn lateral chordae tendonae - Echo 12/27: EF 20-25%, RV moderately reduced, moderate to severe residual MR - Progressively worsening renal function with hyponatremia and thrombocytopenia consistent with terminal / end-stage heart failure. Not a candidate for advanced therapies at this time due to significant social barriers and now renal failure. Recently discharged to residential hospice on 01/05 but rescinded his DNR. -Now readmitted with cardiogenic shock and MSOF. Back on DBA + NE and lasix gtt -Palliative care re consulted. After further discussion, decision made to change back to DNR. No escalation of care. Plan to transition to comfort care today once family ready.      2. AKI on CKD Stage IIIb - Scr greater than 7 on admit, BUN 100 - Not a candidate for CRRT - labs draws discontinued, transitioned to comfort care    3. MR  - TEE 12/14: EF 20%, Severe central MR - S/p unsuccessful mTEER  12/26 as above with residual severe MR   4. Hyponatremia - Na 125 - Poor prognostic indicator   5 Shock liver - AST and ALT > 1,000  GOC: Patient is a DNR. Family still not ready to stop drips and move to full comfort, and patient is hesitant to receive comfort medications.  D/C planning: Ongoing.    Family: Family present at  bedside.   IDT: Updated  Should patient need ambulance transfer - Please use [facility]) as they contract this service for our active hospice patients.   Douglas Gottron, RN [facility] Liaison  [phone]

## 2022-11-24 NOTE — Progress Notes (Addendum)
Advanced Heart Failure Rounding Note  PCP-Cardiologist: None   Subjective:    Admitted 12-Dec-2022 with cardiogenic shock and MSOF. He rescinded his DNR. Restarted on DBA and lasix gtt.   After discussion w/ PC, changed back to DNR. No escalation of care. Planning transition to comfort care today when family ready.   Remains on DBA 5 + NE 2. Lasix gtt at 20/hr w/ minimal UOP.  Patient and family still not ready to transition to comfort gtts yet. Sister at bedside. Pt w/ noticeable increased WOB. More lethargic. Complaining of buttock pain. He was offered fentanyl overnight but declined.    Objective:   Weight Range: 135.2 kg Body mass index is 36.28 kg/m.   Vital Signs:   Temp:  [97.5 F (36.4 C)-99.3 F (37.4 C)] 98.5 F (36.9 C) (01/10 0334) Pulse Rate:  [58-133] 58 (01/10 0700) Resp:  [13-33] 17 (01/10 0700) BP: (84-123)/(42-109) 100/66 (01/10 0700) SpO2:  [84 %-100 %] 93 % (01/10 0700)    Weight change: Filed Weights   Dec 12, 2022 0450 11/23/22 0500  Weight: 133.8 kg 135.2 kg    Intake/Output:   Intake/Output Summary (Last 24 hours) at 11/24/2022 0738 Last data filed at 11/24/2022 0700 Gross per 24 hour  Intake 981.27 ml  Output 950 ml  Net 31.27 ml      Physical Exam    General:  Critically ill/fatigued appearing. Lethargic, will awake and answer questions. Mild increased WOB  HEENT: Normal Neck: Supple. JVP to ear. Carotids 2+ bilat; no bruits.  Cor: PMI nondisplaced. Regular rhythm and tachy rate. No rubs, gallops or murmurs. Lungs: Bibasilar rales. Using abdominal muscles to breathe. Abdomen: Soft, nontender, + distended.  Extremities: No cyanosis, clubbing, rash, 2+ b/l pitting edema, cool distal ext  Neuro: Lethargic but will awaken and respond to questions   Telemetry   Sinus tach 110s brief runs of NSVT overnight   Labs    CBC Recent Labs    2022/12/12 0128 12/12/22 0157 December 12, 2022 0555  WBC 24.4*  --  43.3*  NEUTROABS 20.5*  --  35.5*  HGB  11.5* 12.2* 11.1*  HCT 32.8* 36.0* 33.1*  MCV 84.1  --  85.8  PLT 63*  --  67*   Basic Metabolic Panel Recent Labs    63/87/56 0128 December 12, 2022 0157 2022-12-12 0555  NA 125* 125* 125*  K 5.7* 5.4* 5.5*  CL 92*  --  91*  CO2 13*  --  15*  GLUCOSE 184*  --  145*  BUN 102*  --  105*  CREATININE 6.97*  --  7.04*  CALCIUM 8.7*  --  8.6*   Liver Function Tests Recent Labs    12-Dec-2022 0555  AST 1,676*  ALT 1,511*  ALKPHOS 177*  BILITOT 3.1*  PROT 7.1  ALBUMIN 3.0*   No results for input(s): "LIPASE", "AMYLASE" in the last 72 hours. Cardiac Enzymes No results for input(s): "CKTOTAL", "CKMB", "CKMBINDEX", "TROPONINI" in the last 72 hours.  BNP: BNP (last 3 results) Recent Labs    10/25/22 1442 10/28/22 1825 12-Dec-2022 0128  BNP 1,428.2* 491.6* >4,500.0*    ProBNP (last 3 results) No results for input(s): "PROBNP" in the last 8760 hours.   D-Dimer No results for input(s): "DDIMER" in the last 72 hours. Hemoglobin A1C No results for input(s): "HGBA1C" in the last 72 hours. Fasting Lipid Panel No results for input(s): "CHOL", "HDL", "LDLCALC", "TRIG", "CHOLHDL", "LDLDIRECT" in the last 72 hours. Thyroid Function Tests No results for input(s): "TSH", "T4TOTAL", "T3FREE", "  THYROIDAB" in the last 72 hours.  Invalid input(s): "FREET3"  Other results:   Imaging    No results found.   Medications:     Scheduled Medications:  Chlorhexidine Gluconate Cloth  6 each Topical Daily   sodium chloride flush  3 mL Intravenous Q12H   sodium zirconium cyclosilicate  10 g Oral Once   sodium zirconium cyclosilicate  10 g Oral BID    Infusions:  sodium chloride     sodium chloride Stopped (12/05/22 0824)   DOBUTamine 5 mcg/kg/min (11/24/22 0700)   fentaNYL infusion INTRAVENOUS     furosemide (LASIX) 200 mg in dextrose 5 % 100 mL (2 mg/mL) infusion 20 mg/hr (11/24/22 0700)   norepinephrine (LEVOPHED) Adult infusion 2 mcg/min (11/24/22 0700)    PRN Medications: sodium  chloride, acetaminophen, fentaNYL (SUBLIMAZE) injection, guaiFENesin, ipratropium-albuterol, LORazepam, prochlorperazine, sodium chloride flush   Assessment/Plan    1. 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 Has Medtronic ICD>>deactivated. Had [place] earlier this year with nonobstructive CAD.   - s/p unsuccessful MitraClip secondary to severe residual MR with torn lateral chordae tendonae - Echo 12/27: EF 20-25%, RV moderately reduced, moderate to severe residual MR - Progressively worsening renal function with hyponatremia and thrombocytopenia consistent with terminal / end-stage heart failure. Not a candidate for advanced therapies at this time due to significant social barriers and now renal failure. Recently discharged to residential hospice on 01/05 but rescinded his DNR. -Now readmitted with cardiogenic shock and MSOF. Back on DBA + NE and lasix gtt -Palliative care re consulted. After further discussion, decision made to change back to DNR. No escalation of care. Plan to transition to comfort care today once family ready.     2. AKI on CKD Stage IIIb - Scr greater than 7 on admit, BUN 100 - Not a candidate for CRRT - labs draws discontinued, transitioned to comfort care    3. MR  - TEE 12/14: EF 20%, Severe central MR - S/p unsuccessful mTEER 12/26 as above with residual severe MR   4. Hyponatremia - Na 125 - Poor prognostic indicator  5 Shock liver - AST and ALT > 1,000  GOC: - DNR - plan transition to comfort care today when pt and family ready   Length of Stay: 2  Lyda Jester, PA-C  11/24/2022, 7:38 AM  Advanced Heart Failure Team Pager [phone] (M-F; 7a - 5p)  Please contact [facility] for night-coverage after hours (5p -7a ) and weekends on [email or web address]   Patient seen with PA, agree with the above note.   He is more lethargic this morning, last BP 79/57.  He is now DNR/DNI with comfort meds available, but family has yet to make decision to  stop gtts.    General: Lethargic Neck: JVP 16 cm, no thyromegaly or thyroid nodule.  Lungs: Clear to auscultation bilaterally with normal respiratory effort. CV: Nondisplaced PMI.  Heart regular S1/S2, no S3/S4, no murmur.  2+ edema to knees.  1+ edema forearms.  Abdomen: Soft, nontender, no hepatosplenomegaly, no distention.  Skin: Intact without lesions or rashes.  Neurologic: Alert and oriented x 3.  Psych: Sleeping but will awaken with stimulation.   HEENT: Normal.   He is tachypneic but seems comfortable otherwise.  More lethargic today. BP is lower.    End stage HF, not candidate for LVAD or transplant with renal failure and social situation.  Was sent to residential hospice after last admission. Rescinded DNR and returned to hospital due to dyspnea.  He is in cardiogenic shock with AKI and creatinine up to 7 at admission.  Shock liver present.  He is encephalopathic likely due to low output HF.  Extensive discussion has been had with family.  I do not think that there is a way for him to survive this event.  Sister is POA, patient is encephalopathic and unable to make decisions at this point.  I have told her that he would not tolerate dialysis and is not a candidate for long-term HD. I do not think that he would benefit from intubation as there is not going to be a way to get fluid off him to allow extubation (AKI and not responding to diuretics, not HD candidate). Patient is now DNR/DNI and we are not drawing labs, etc.  Family still struggling with decision to stop gtts (NE, dobutamine, Lasix).     Sister is at bedside, patient is comfortable.  Awaiting decision on discontinuation of meds.  Anticipate in-hospital death.  Palliative care following and will meet with family again today.  Loralie Champagne 11/24/2022 8:49 AM

## 2022-11-24 NOTE — Progress Notes (Signed)
Palliative Care Progress Note, Assessment & Plan   Patient Name: Douglas [name]       Date: 11/24/2022 DOB: [birth year]  Age: 47 y.o. MRN#: [id number] Attending Physician: Larey Dresser, MD Primary Care Physician: Gildardo Pounds, NP Admit Date: 11/18/2022  Subjective: Patient is sitting up in bed in mild distress.  His respirations are even but RR is elevated. He is using accessory muscle and has a non-productive, strong cough to which he winces in pain. His sister, two brothers, and girlfriend are at bedside.   HPI: 47 y.o. male  with past medical history of HTN, HLD, cocaine use, end-stage biventricular HF (not candidate for LVAD or transplant with renal failure and social situation) admitted on 12/09/2022 from [facility] with SOB.  Patient is currently being treated with dobutamine, levophed, and lasix gtt.    As per cardiology, any treatment is likely only a temporary solution due to [facility] and failed MitraClip.   PMT was consulted to discuss goals of care.  Summary of counseling/coordination of care: After reviewing the patient's chart and assessing the patient at bedside, I spoke with patient and family/friends at bedside in regards to plan of care.  Outlined that patient appears to be suffering and has had minimal medications to relieve this.  Reviewed that fentanyl will take work of breathing off of heart as well as reduced pain.  Family shares they want to make sure patient understands fully what will transpire with full comfort measures.    I spoke directly with the patient who was awake, alert, and able to participate in discussions with me briefly.  When asked what patient wants, he said "I have already said this 7 times - turn everything off". We discussed  liberating patient from all drips and utilizing medications to aggressively manage his symptoms.  Patient shook his head in agreement.  Family at bedside was tearful but also in agreement.  Orders placed to shift to full comfort.  Fentanyl gtt. with ability to bolus from infusion bag placed.  Unrestricted visitor access to be initiated.  PMT will continue to follow and support patient/family throughout his hospitalization.  Attending Dr. Aundra Dubin made aware of patient and family's agreement to shift to full comfort.  Physical Exam Vitals reviewed.  Constitutional:      General: He is in acute distress.     Appearance: He is obese. He is ill-appearing.  Cardiovascular:     Rate and Rhythm: Tachycardia present.  Abdominal:     Palpations: Abdomen is soft.  Musculoskeletal:     Right lower leg: Edema present.     Left lower leg: Edema present.     Comments: Generalized weakness  Neurological:     Mental Status: He is oriented to person, place, and time.  Psychiatric:        Mood and Affect: Mood is not anxious.        Behavior: Behavior is not agitated.             Palliative Assessment/Data: 20%    Total Time 90 minutes   Thank you for allowing the Palliative Medicine Team to assist in the care of this patient.  [place] Ilsa Iha, FNP-BC Palliative Medicine Team Team Phone # [phone]

## 2022-12-09 ENCOUNTER — Inpatient Hospital Stay: Payer: Medicaid Other | Admitting: Physician Assistant

## 2022-12-16 NOTE — Death Summary Note (Signed)
  Advanced Heart Failure Death Summary  Death Summary   Patient ID: Douglas [name] MRN: [id number], DOB/AGE: 49-Jul-1977 49 y.o. Admit date: 12/14/2022 D/C date:     2022-12-18   Primary Discharge Diagnoses:  Stage D Systolic Heart Failure, NYHA Class IV  AKI on CKD Stage IIIb  Shock Liver Severe Mitral Regurgitation  Hyponatremia   Hospital Course:   47 y/o AAM w/ end-stage HF due to NICM, multiple recent hospitalizations for a/c CHF w/ low-output requiring inotropic support, severe MR s/p failed mitral clip, recently deemed not a candidate for advanced therapies including transplant and VAD given ongoing drug use, difficult social situation and progressive renal failure. Last admit, was seen by palliative care and decision made to change to DNR. ICD deactivated. Pt was agreeable to hospice care. Discharged to residential hospice facility on 11/19/22.   While at hospice facility, patient rescinded DNR and returned to hospital on 1/8 due to dyspnea. Presented w/ cardiogenic shock with AKI and creatinine up to 7. Shock liver present. He was encephalopathic. Was readmitted and stared back on inotropic support w/ DBA and placed on lasix gtt.  Continued to respond poorly w/ minimal urine output and progressive renal and hypoxic respiratory failure. Palliative care re-consulted. After discussion w/ family, decision made to change back to DNR w/ no escalation of care. Time allowed for additional family to visit. On 1/10, decision was made to stop inotrope/pressor support and transition to comfort gtts. Pt passed 18-Dec-2022. TOD 0745.    Significant Diagnostic Studies None   Consultations  Advanced Heart Failure  Palliative Care   Duration of Discharge Encounter: Greater than 35 minutes   Signed, Lyda Jester, PA-C 12/18/22, 2:32 PM

## 2022-12-16 NOTE — Progress Notes (Deleted)
Douglas [name] [facility]) Hospital Liaison Note    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 Patient was admitted to [facility] on 11/15/2022 with Cardiogenic Shock. Patient had been a patient at [facility] since 11/19/22 and on the morning of 12/03/2022 requested transfer to ED for evaluation of chest pain, nausea, with the desire to seek full treatment.  This a related admission per Dr. Tomasa Hosteller, an [facility] physician.  Please do not hesitate to call [facility] with any questions.    Thank you,  Zigmund Gottron RN  [facility] Liaison [phone]

## 2022-12-16 NOTE — Progress Notes (Signed)
   11/23/2022 1100  Spiritual Encounters  Type of Visit Initial  Care provided to: [facility] partners present during encounter Nurse  Referral source Nurse (RN/NT/LPN)  Reason for visit Grief/loss   Chaplain paged to 5N to assist a grieving family following the death of pt.  Offering support and comfort, Chaplain found the family in the waiting area.  Family member shared the death was anticipated.  Chaplain assured them of continued assistance if needed. Hubert Azure, [place]  Pager [phone]

## 2022-12-16 DEATH — deceased

## 2022-12-18 LAB — ECHO TEE
Est EF: <20
MV M vel: 4.28 m/s
MV Peak grad: 73.3 mmHg
Radius: 0.95 cm

## 2022-12-20 ENCOUNTER — Ambulatory Visit: Payer: Medicaid Other

## 2023-01-06 ENCOUNTER — Ambulatory Visit: Payer: Medicaid Other | Admitting: Nurse Practitioner
